# Patient Record
Sex: Male | Born: 1940
Health system: Southern US, Community
[De-identification: ages and names within clinical notes are randomized; demographics above are authoritative.]

## PROBLEM LIST (undated history)

## (undated) DIAGNOSIS — E785 Hyperlipidemia, unspecified: Secondary | ICD-10-CM

## (undated) DIAGNOSIS — R7303 Prediabetes: Secondary | ICD-10-CM

## (undated) DIAGNOSIS — C801 Malignant (primary) neoplasm, unspecified: Secondary | ICD-10-CM

## (undated) DIAGNOSIS — Z85038 Personal history of other malignant neoplasm of large intestine: Secondary | ICD-10-CM

## (undated) DIAGNOSIS — M1990 Unspecified osteoarthritis, unspecified site: Secondary | ICD-10-CM

## (undated) DIAGNOSIS — E119 Type 2 diabetes mellitus without complications: Secondary | ICD-10-CM

## (undated) DIAGNOSIS — G709 Myoneural disorder, unspecified: Secondary | ICD-10-CM

## (undated) DIAGNOSIS — I10 Essential (primary) hypertension: Secondary | ICD-10-CM

## (undated) DIAGNOSIS — Z8489 Family history of other specified conditions: Secondary | ICD-10-CM

## (undated) HISTORY — PX: JOINT REPLACEMENT: SHX530

## (undated) HISTORY — DX: Personal history of other malignant neoplasm of large intestine: Z85.038

## (undated) HISTORY — PX: COLON SURGERY: SHX602

## (undated) MED FILL — Dexamethasone Sodium Phosphate Inj 100 MG/10ML: INTRAMUSCULAR | Qty: 1 | Status: AC

---

## 2008-10-19 HISTORY — PX: JOINT REPLACEMENT: SHX530

## 2008-12-08 ENCOUNTER — Emergency Department: Payer: Self-pay | Admitting: Emergency Medicine

## 2008-12-08 IMAGING — CT CT STONE STUDY
1 of 2 series · 16 of 32 positions shown, 20 images · non-contrast
Comparison: none

REASON FOR EXAM: left abd pain
COMMENTS:

[Series 2: stone · axial · 0.89mm/px · z∈[-733,-277]mm · 16 of 166 slices shown, 20 images]
[im 7/166  soft-tissue]
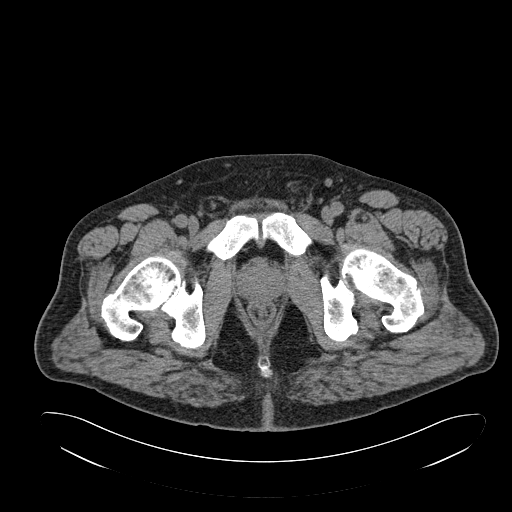
[im 7/166  bone]
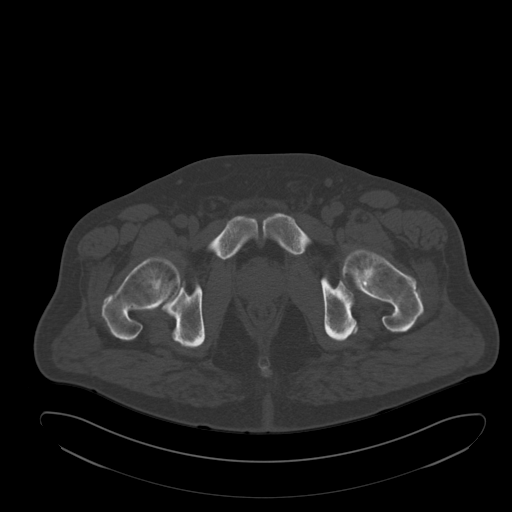
[im 19/166  soft-tissue]
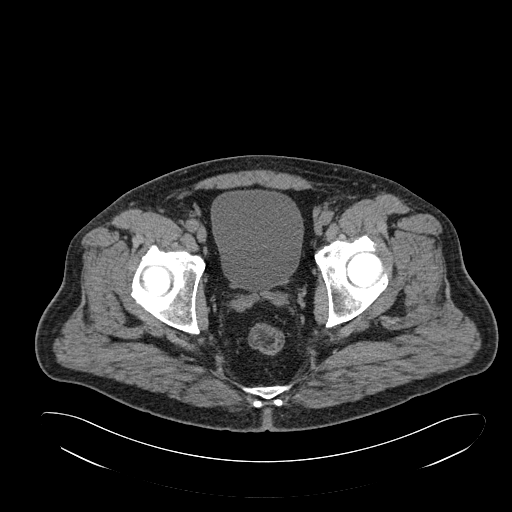
[im 31/166  soft-tissue]
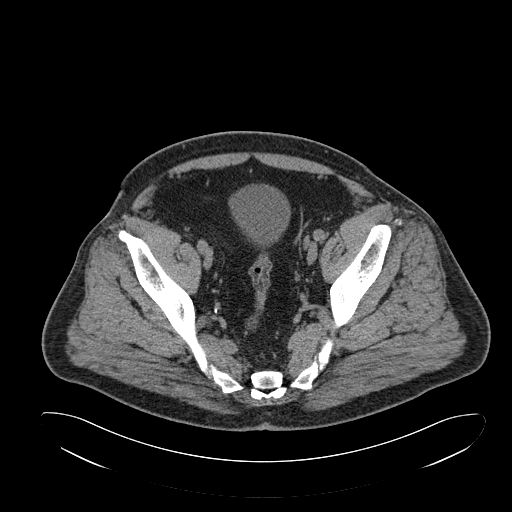
[im 43/166  soft-tissue]
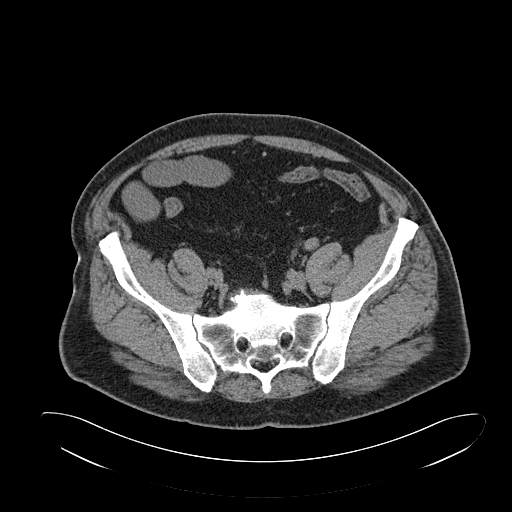
[im 56/166  soft-tissue]
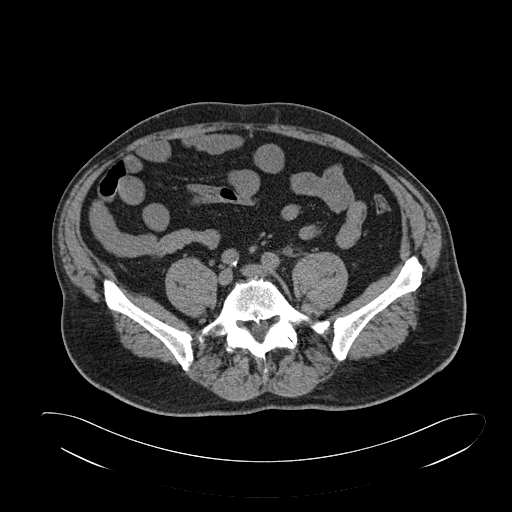
[im 68/166  soft-tissue]
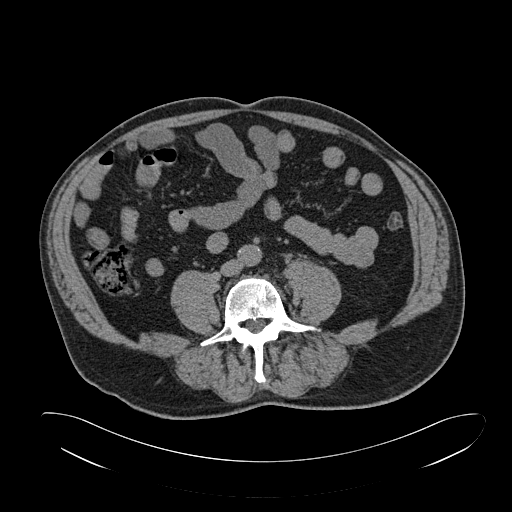
[im 80/166  soft-tissue]
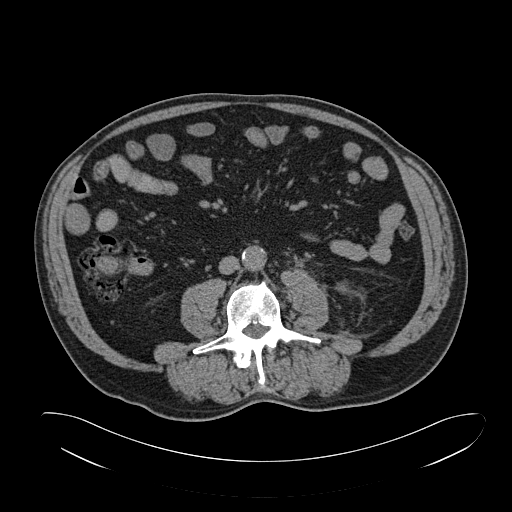
[im 86/166  soft-tissue]
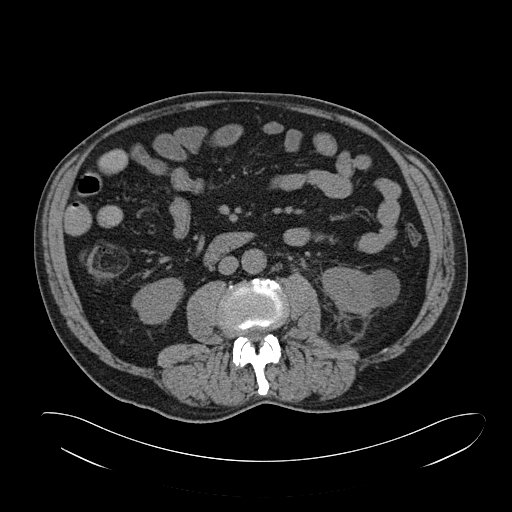
[im 98/166  soft-tissue]
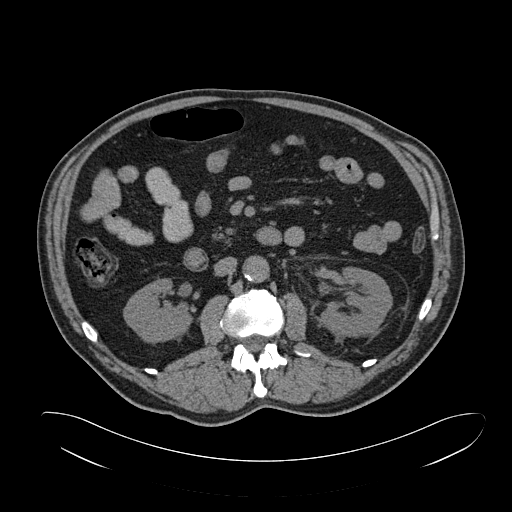
[im 98/166  bone]
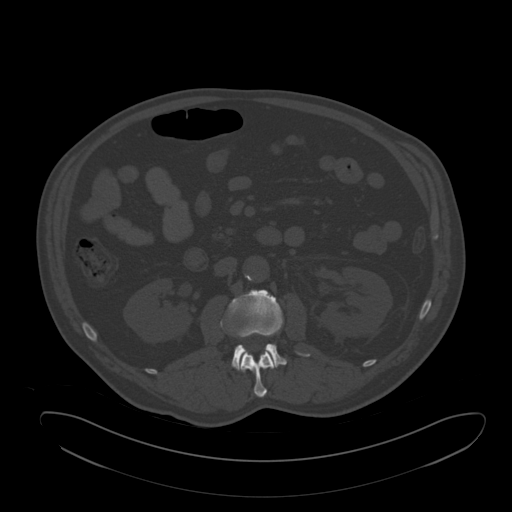
[im 111/166  soft-tissue]
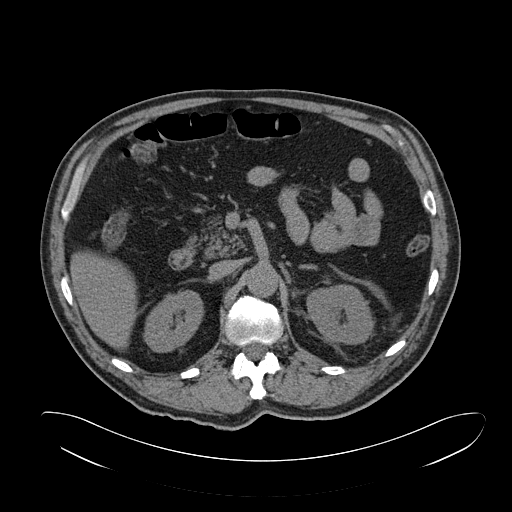
[im 123/166  soft-tissue]
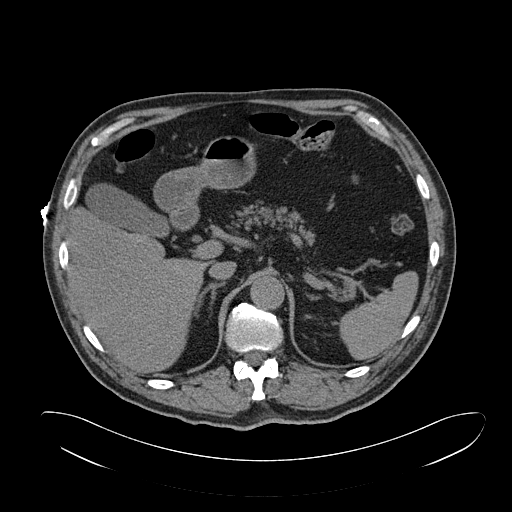
[im 135/166  soft-tissue]
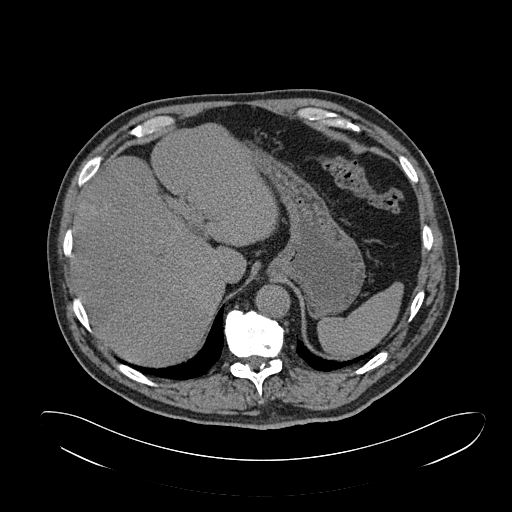
[im 141/166  lung]
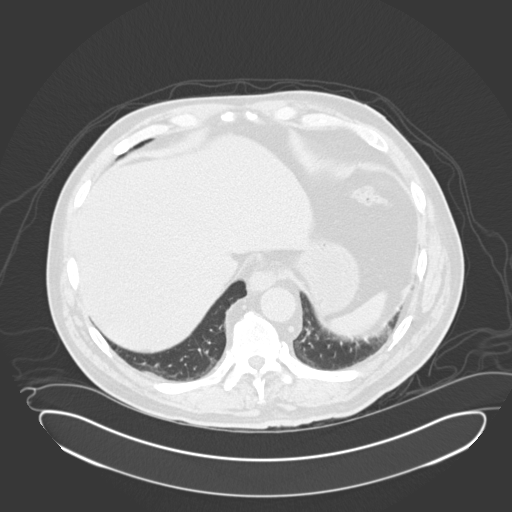
[im 147/166  soft-tissue]
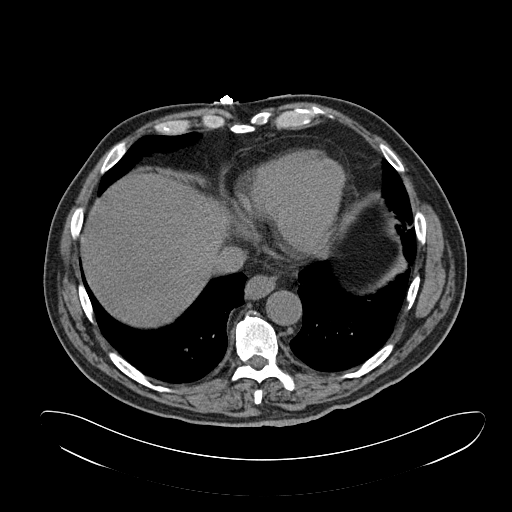
[im 147/166  lung]
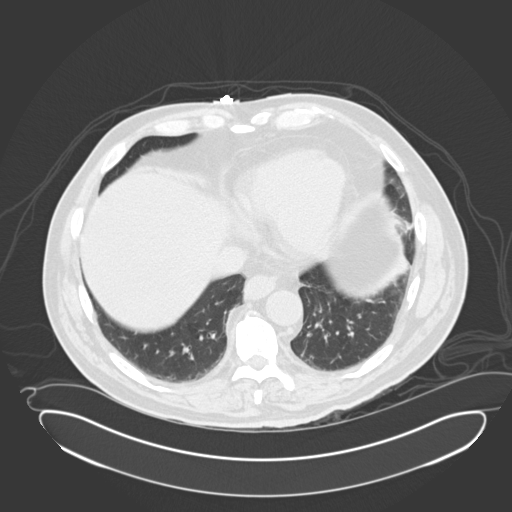
[im 153/166  lung]
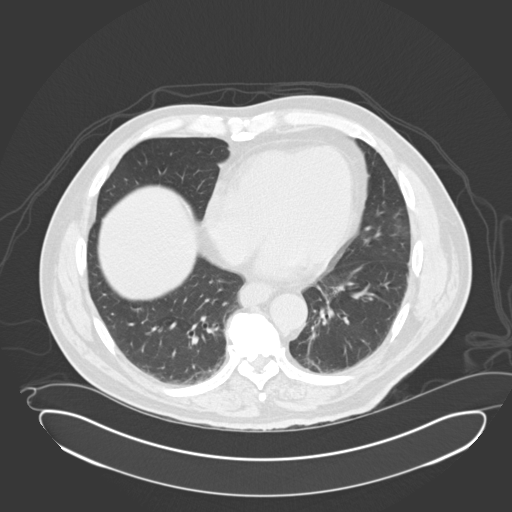
[im 159/166  soft-tissue]
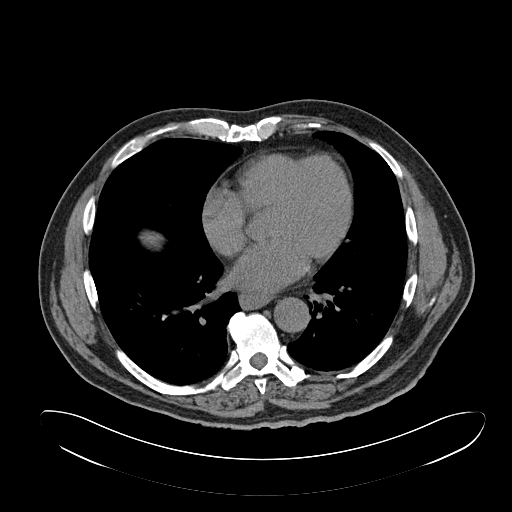
[im 159/166  lung]
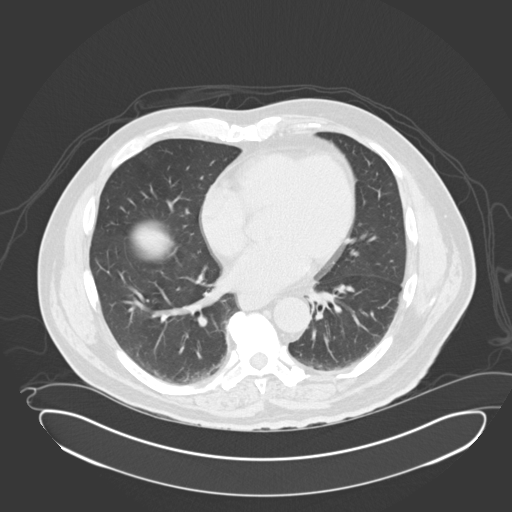

[16 of 32 positions shown; findings below may reference images not displayed]

PROCEDURE:     CT  - CT ABDOMEN /PELVIS WO (STONE)  - [DATE]  [DATE]

RESULT:     A renal stone protocol emergent CT of the abdomen and pelvis
demonstrates mild left hydroureter and hydronephrosis with perinephric and
periureteric stranding secondary to a distal left ureter ureteral stone of 2
mm on image #143. The urinary bladder is unremarkable. There is no ascites.
No additional stones are evident in either kidney. No radiopaque gallstones
are evident. The abdominal viscera otherwise appear to be grossly normal.
The aorta is normal in caliber but does contain some atherosclerotic
calcification. There is atelectasis in the dependent portion of the lung
bases. There is no evidence of diverticulitis or appendicitis.
IMPRESSION: 2 mm distal left ureteral stone with mild hydroureter and hydronephrosis.

## 2008-12-08 IMAGING — CR DG ABDOMEN 3V
1 series · 6 of 6 positions shown · non-contrast
Comparison: none

REASON FOR EXAM: abd  pain
COMMENTS:

[Series 1: view not recorded · 0.17mm/px · 6 of 6 slices shown]
[im 1/6]
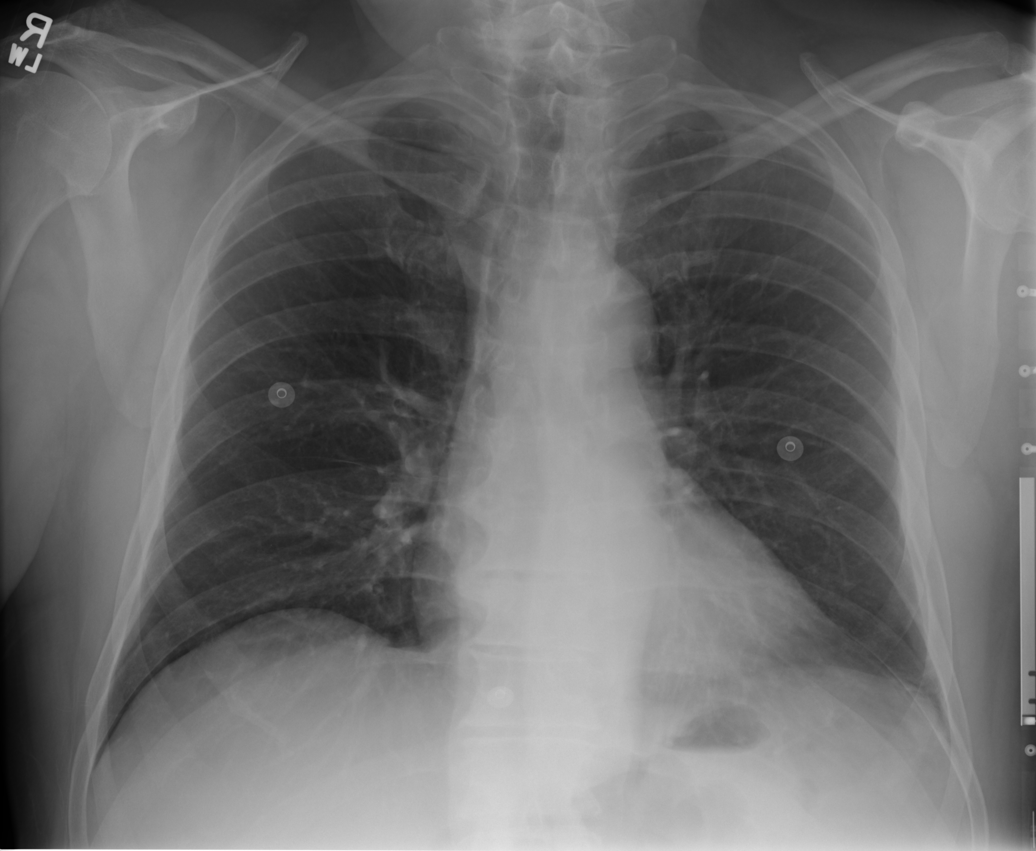
[im 2/6]
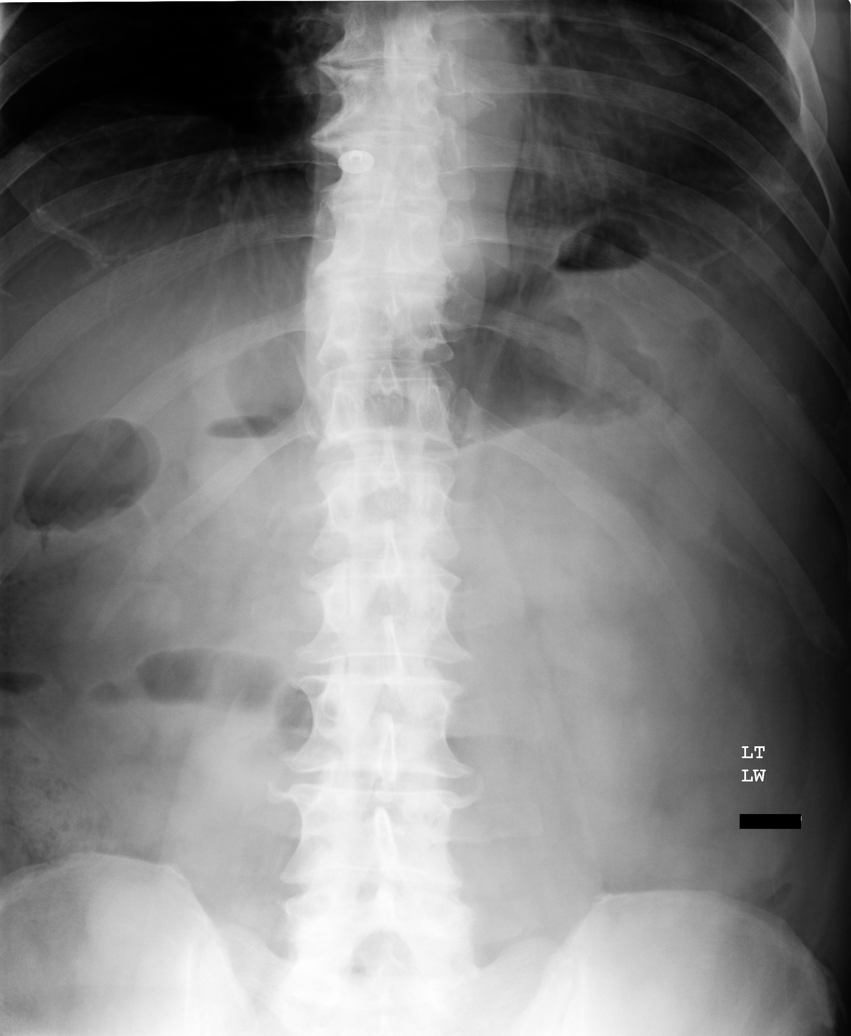
[im 3/6]
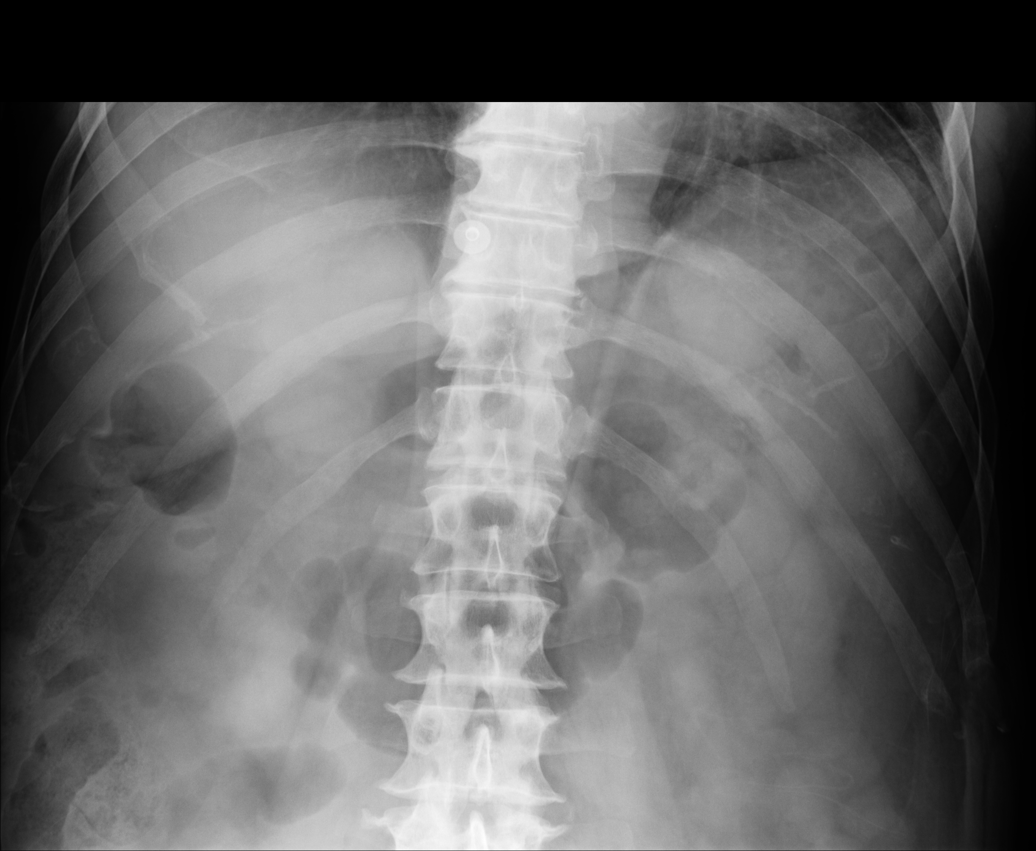
[im 4/6]
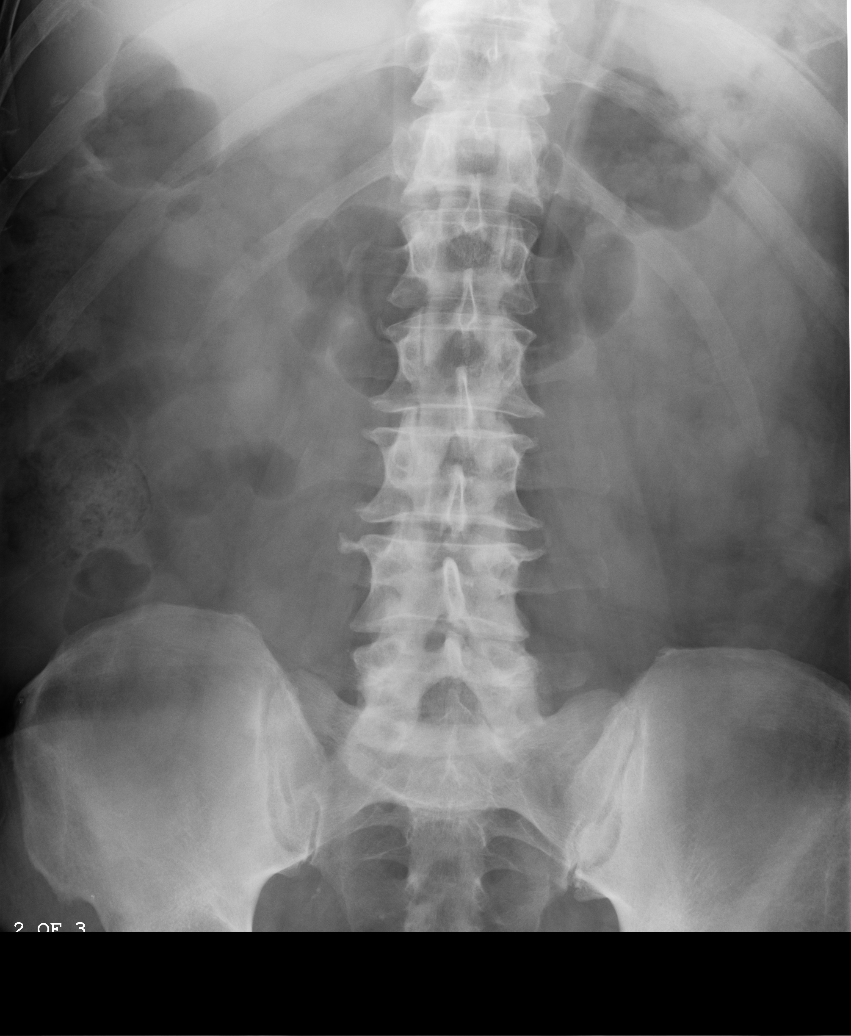
[im 5/6]
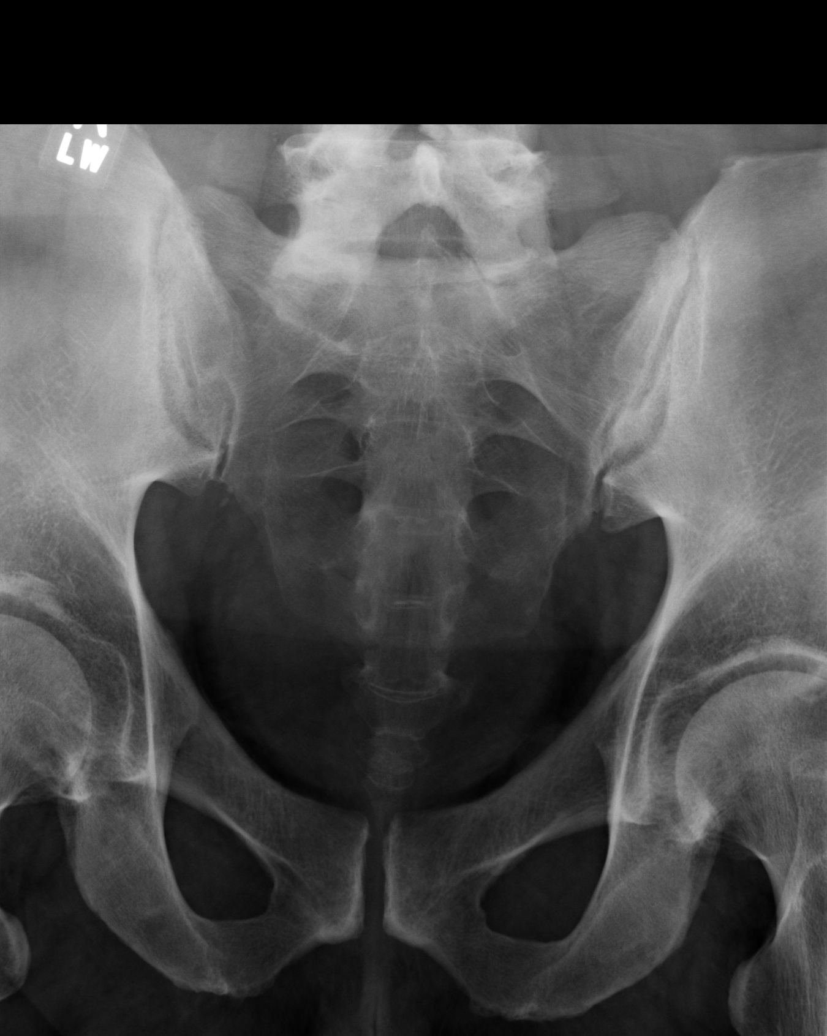
[im 6/6]
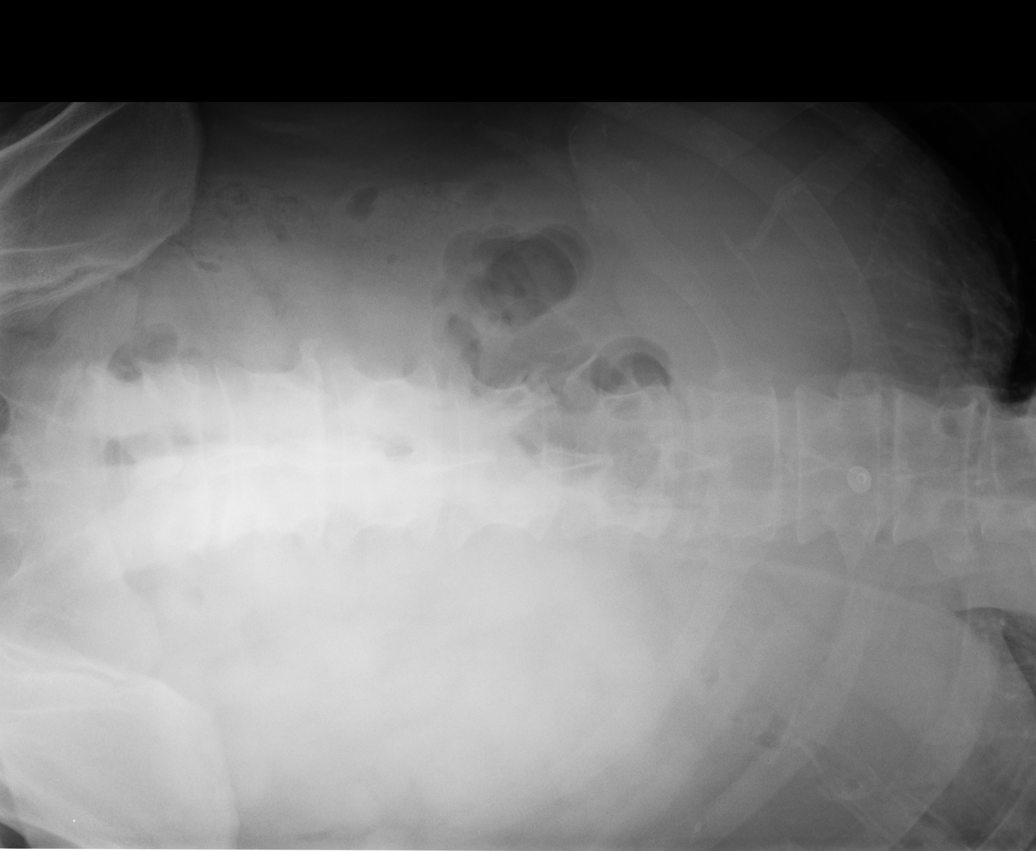

[6 of 6 positions shown; findings below may reference images not displayed]

PROCEDURE:     DXR - DXR ABDOMEN 3-WAY (INCL PA CXR)  - [DATE]  [DATE]

RESULT:      A single image of the chest demonstrates no infiltrate, mass,
edema or effusion. The heart is not enlarged. Degenerative changes are noted
in the thoracic and lumbar spines. Air and stool is scattered through the
colon. There is no abnormal bowel distention. No free air is evident. No
significant air-fluid levels are seen. There is some air within loops of
small bowel.
IMPRESSION: 1. No acute cardiopulmonary disease.
2. No evidence of bowel obstruction or perforation. Continued clinical
followup is recommended.

## 2008-12-09 ENCOUNTER — Emergency Department: Payer: Self-pay | Admitting: Emergency Medicine

## 2008-12-09 IMAGING — CR DG ABDOMEN 3V
1 series · 4 of 4 positions shown · non-contrast
Comparison: none

REASON FOR EXAM: abd pain
COMMENTS:

[Series 1: view not recorded · 0.17mm/px · 4 of 4 slices shown]
[im 1/4]
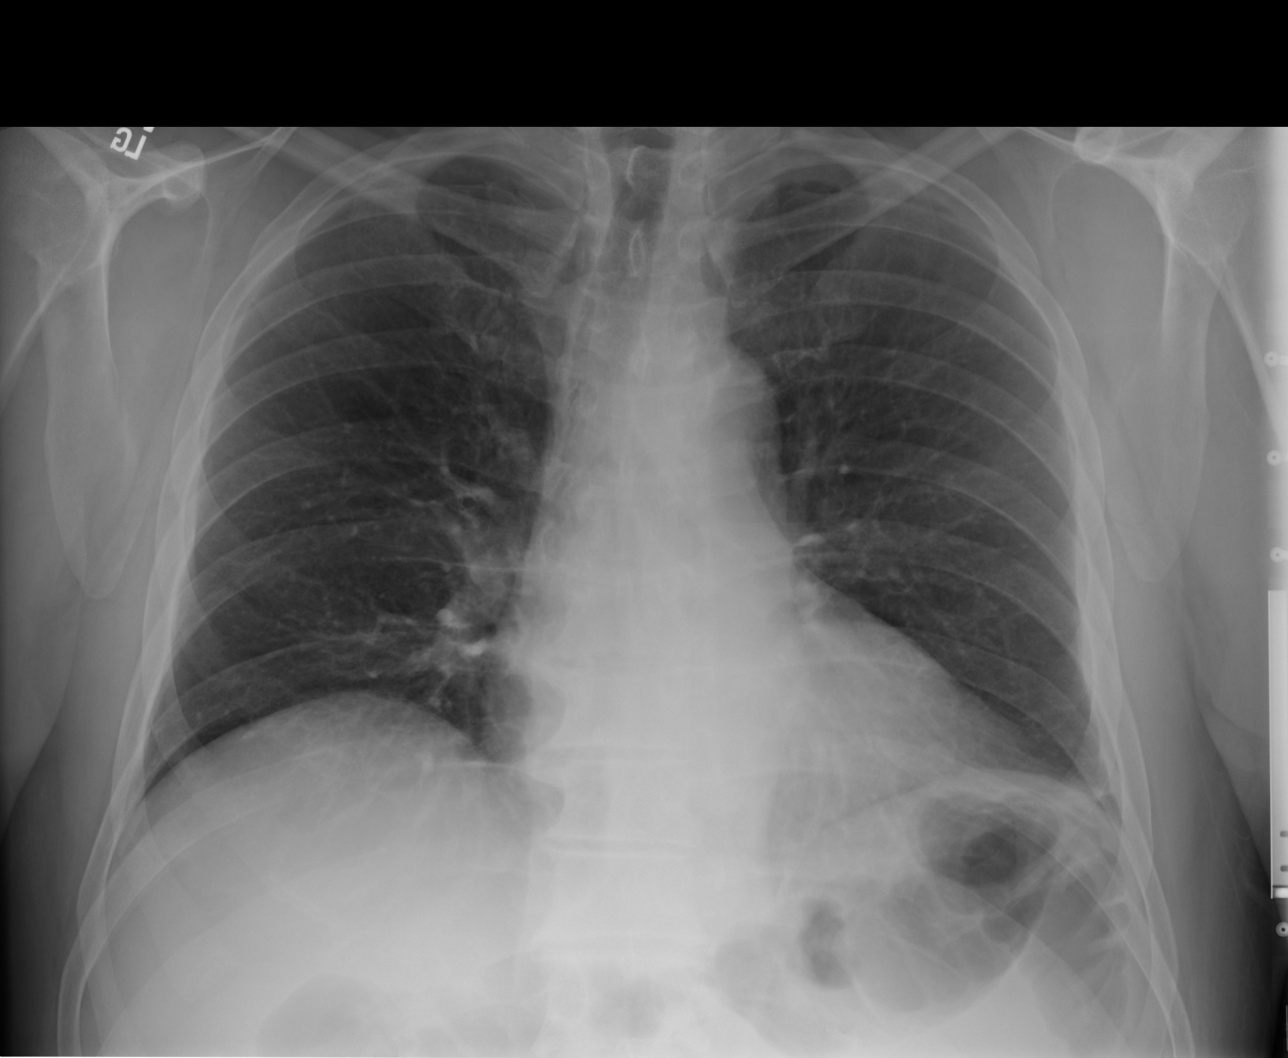
[im 2/4]
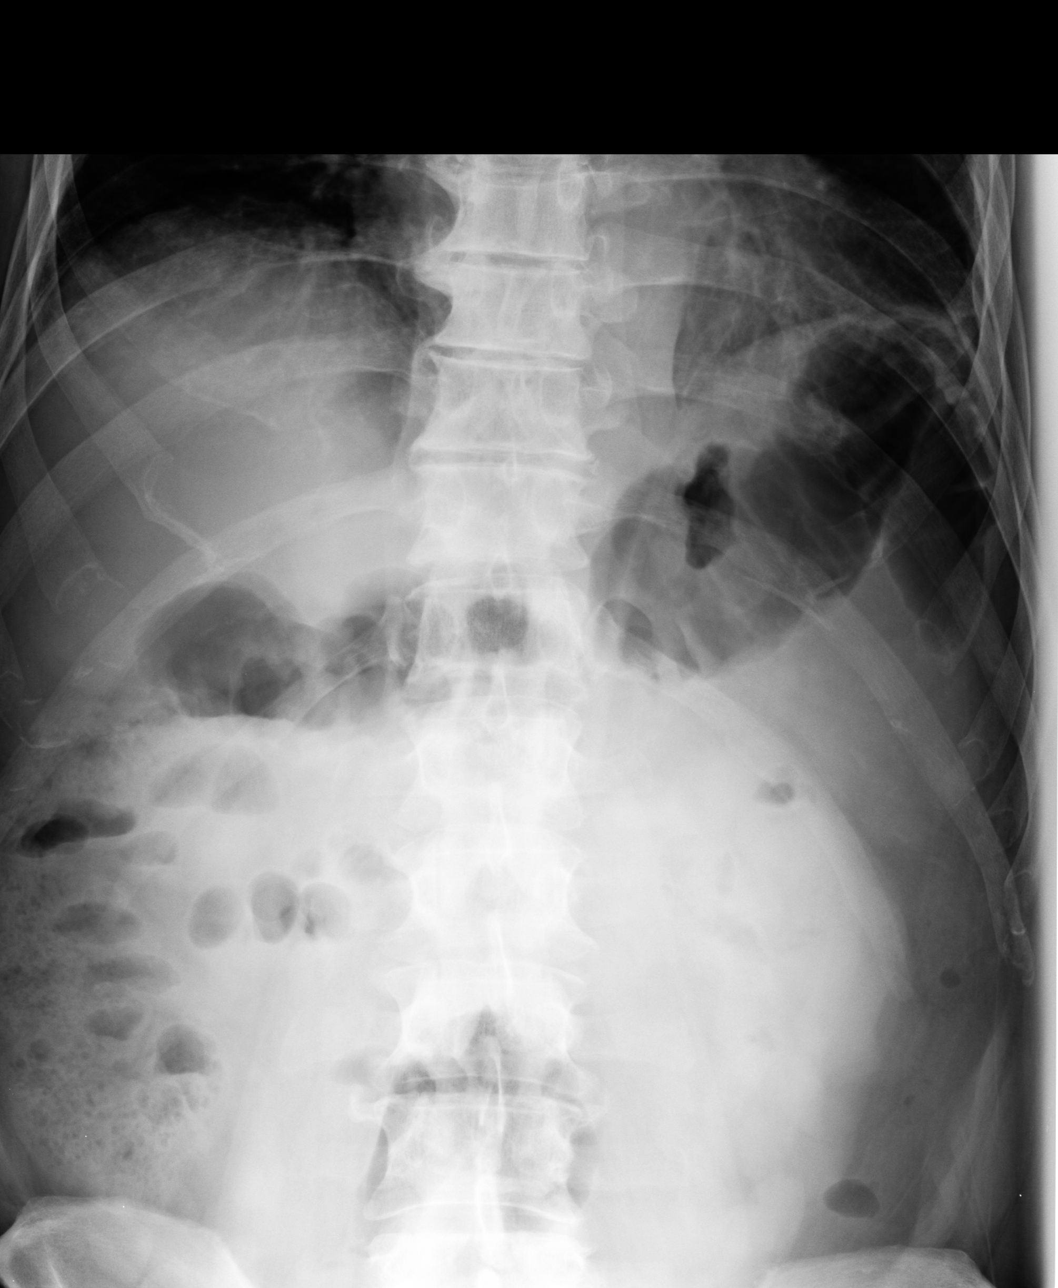
[im 3/4]
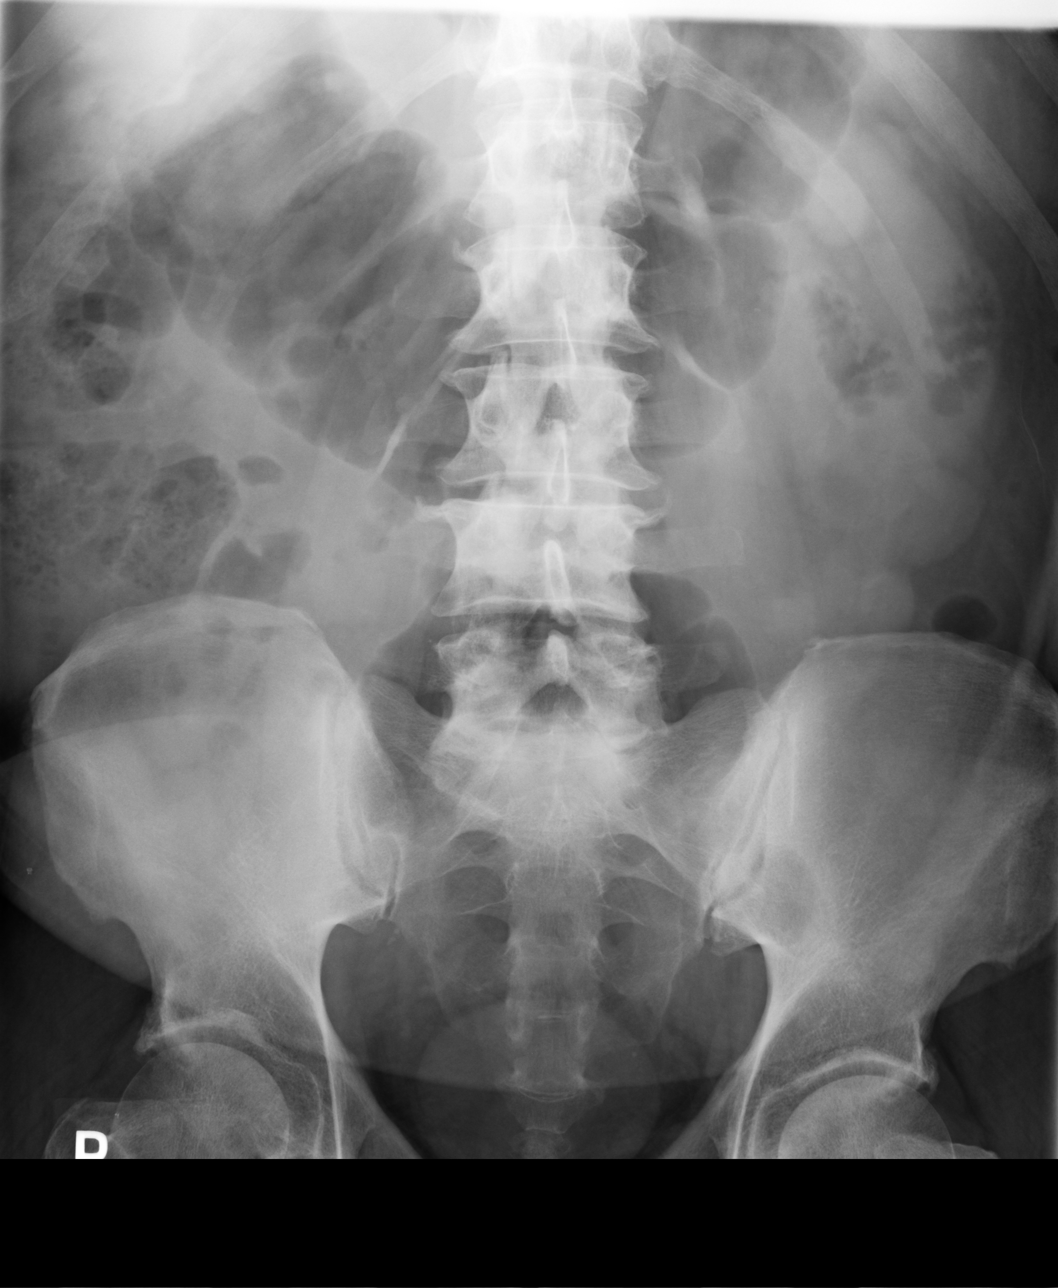
[im 4/4]
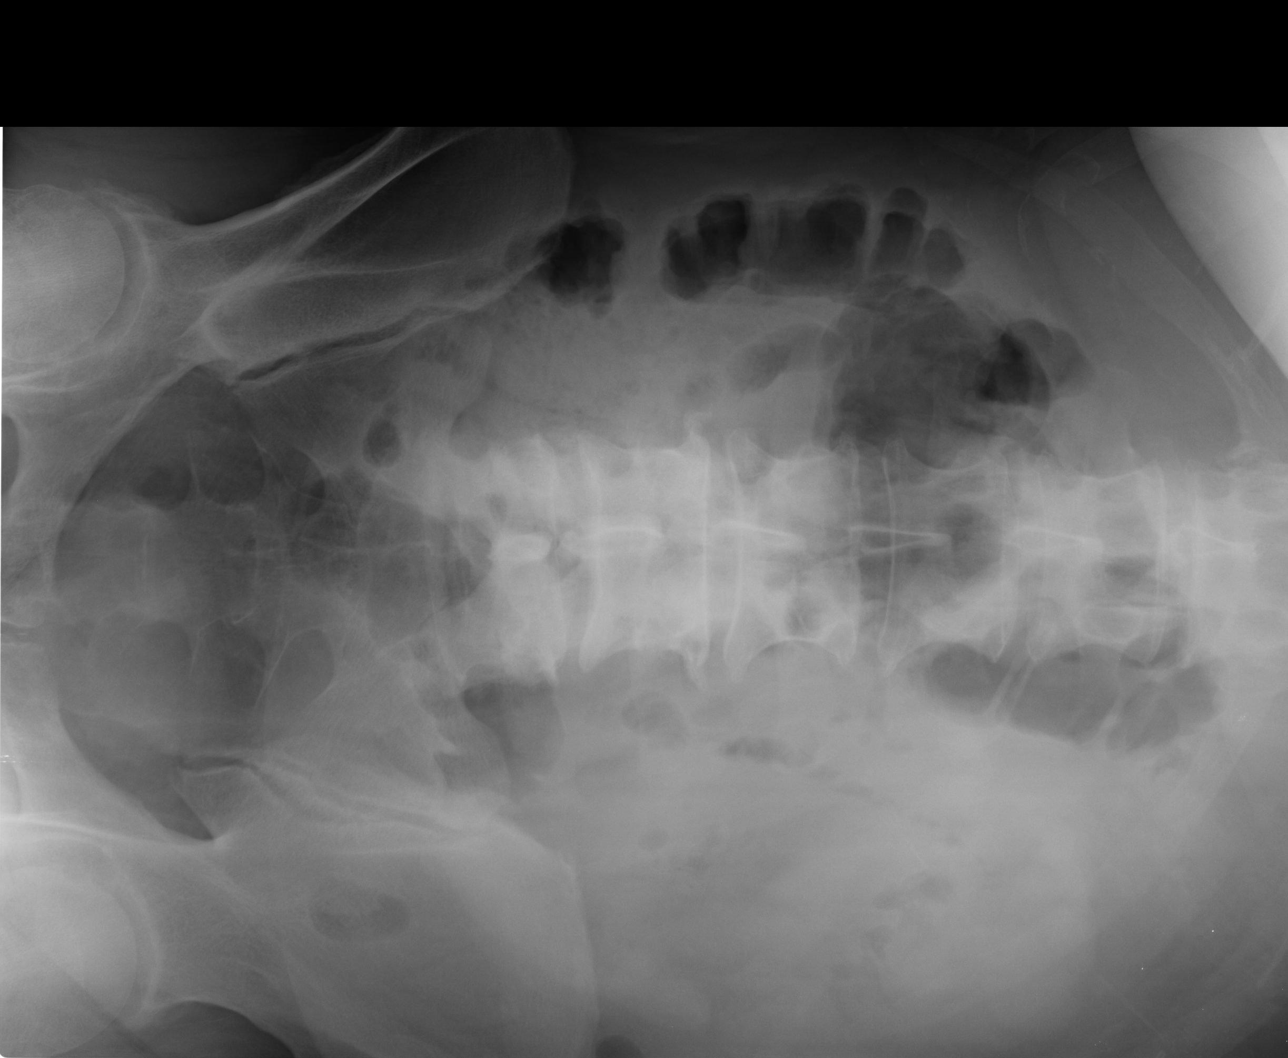

[4 of 4 positions shown; findings below may reference images not displayed]

PROCEDURE:     DXR - DXR ABDOMEN 3-WAY (INCL PA CXR)  - [DATE]  [DATE]

RESULT:     A view of the chest shows shallow inspiration with no acute
cardiopulmonary disease. There is a moderate amount of air and stool
especially in the right and transverse portions of the colon. Some air is
seen within loops of small bowel without severe distention. No free air is
evident.
IMPRESSION: No definite obstruction or perforation. No acute
cardiopulmonary disease. Degenerative changes are noted incidentally in the
spine.

## 2008-12-09 IMAGING — CT CT ABD-PELV W/ CM
1 of 3 series · 12 of 32 positions shown, 18 images · non-contrast
Comparison: none

REASON FOR EXAM: (1) abd pain; (2) abd pain LLQ
COMMENTS:

[Series 2: soft tissue · axial · 0.84mm/px · z∈[-477,-24]mm · 12 of 179 slices shown, 18 images]
[im 14/179  soft-tissue]
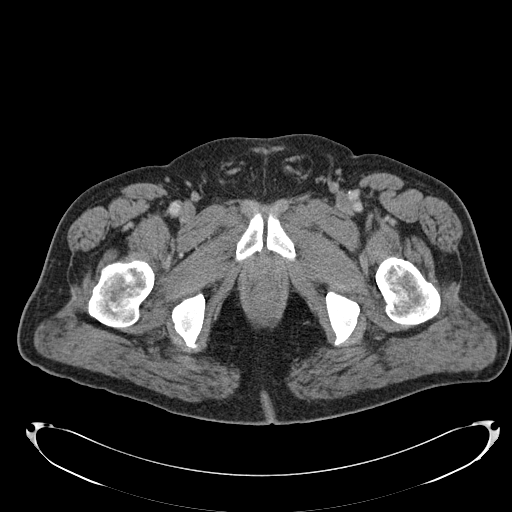
[im 14/179  bone]
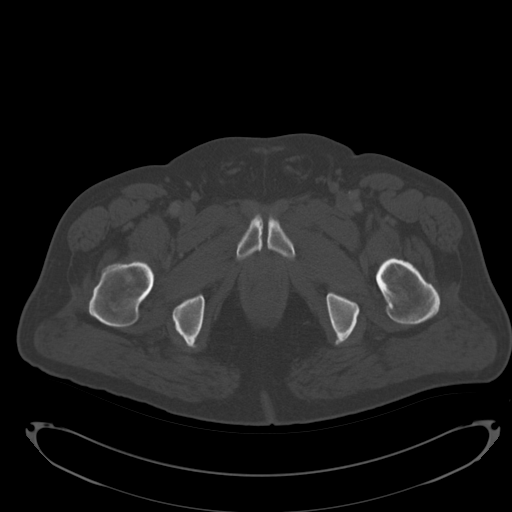
[im 28/179  soft-tissue]
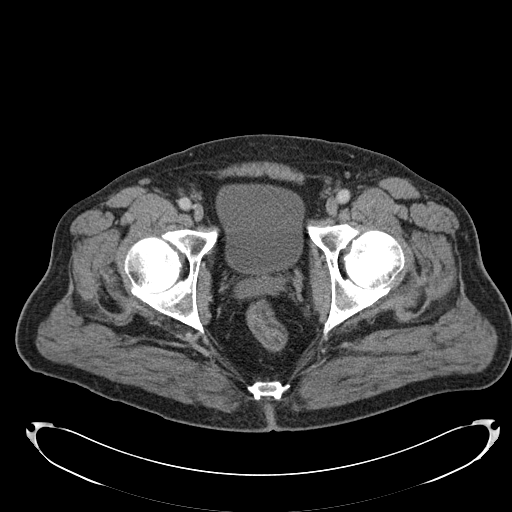
[im 42/179  soft-tissue]
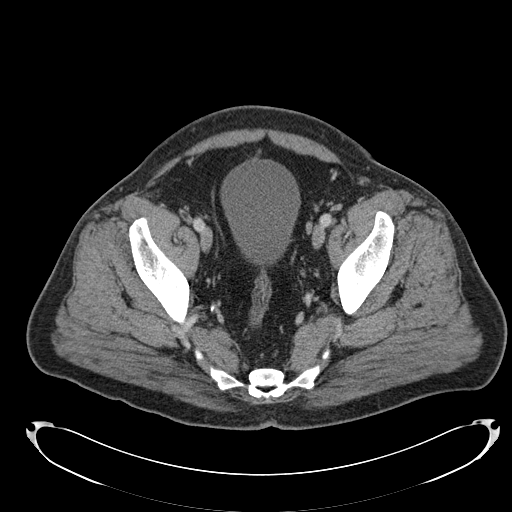
[im 55/179  soft-tissue]
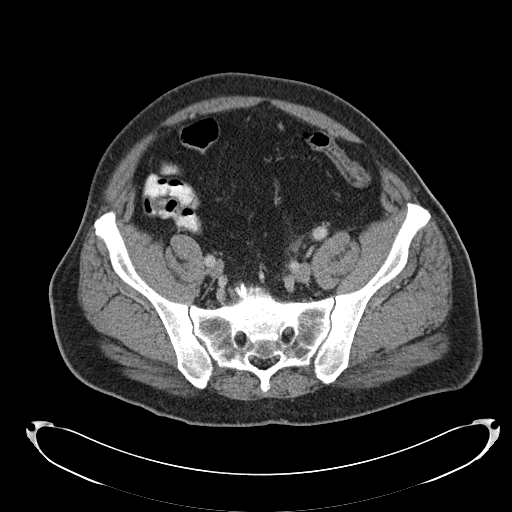
[im 69/179  soft-tissue]
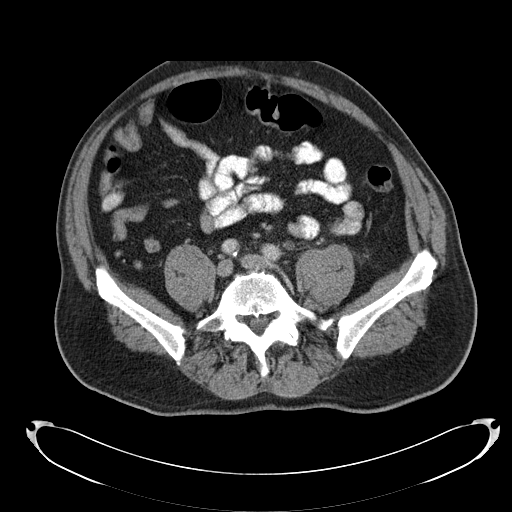
[im 83/179  soft-tissue]
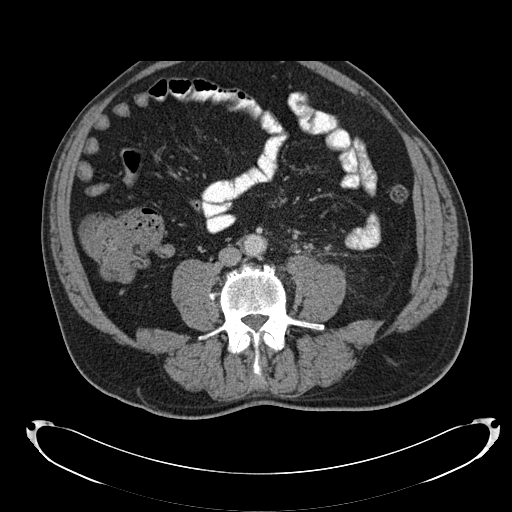
[im 96/179  soft-tissue]
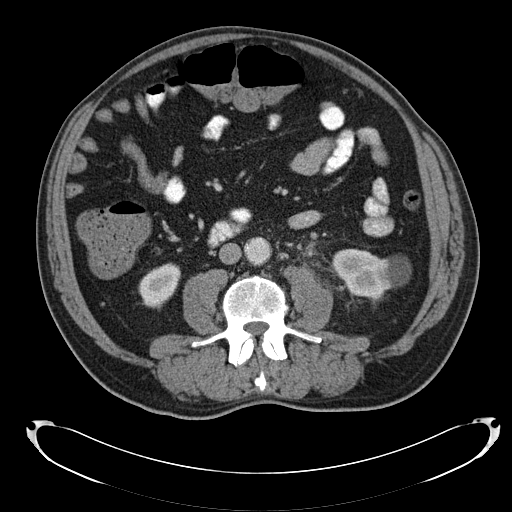
[im 110/179  soft-tissue]
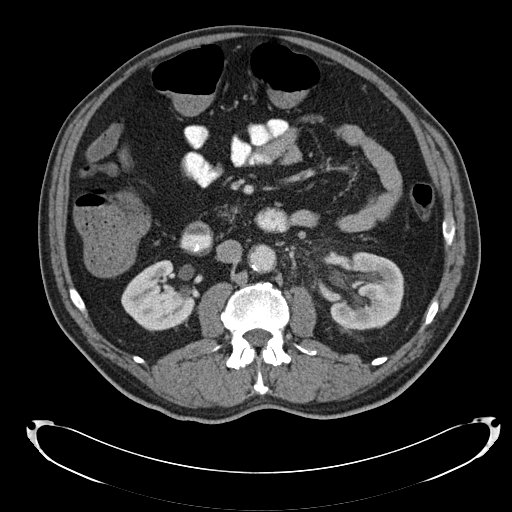
[im 124/179  soft-tissue]
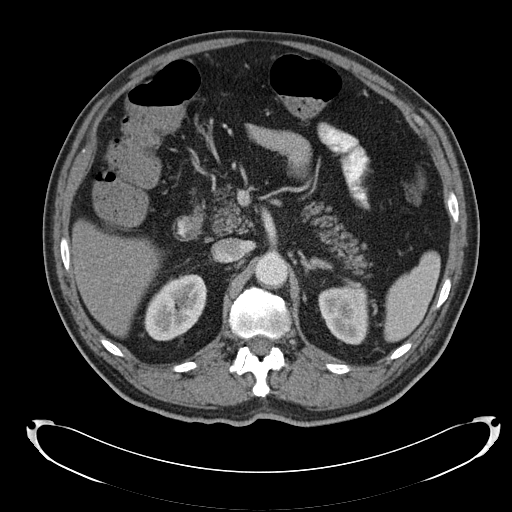
[im 124/179  lung]
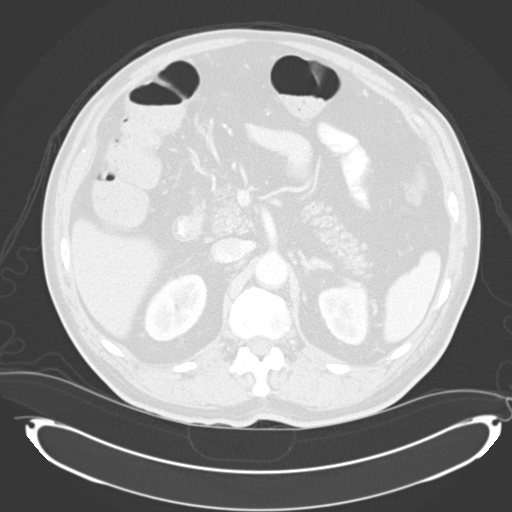
[im 124/179  bone]
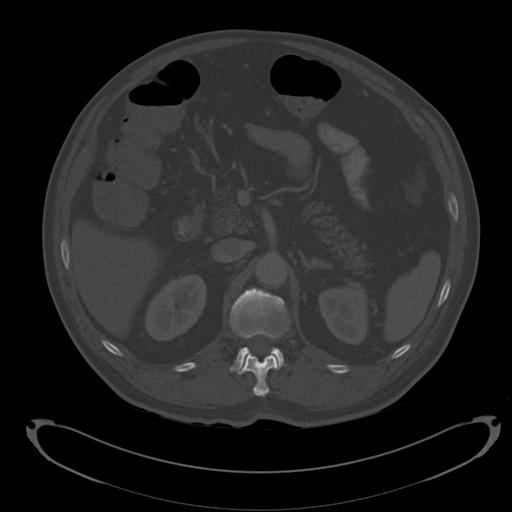
[im 137/179  soft-tissue]
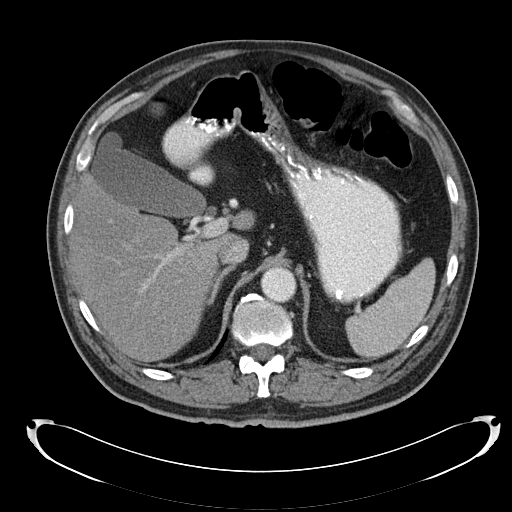
[im 137/179  lung]
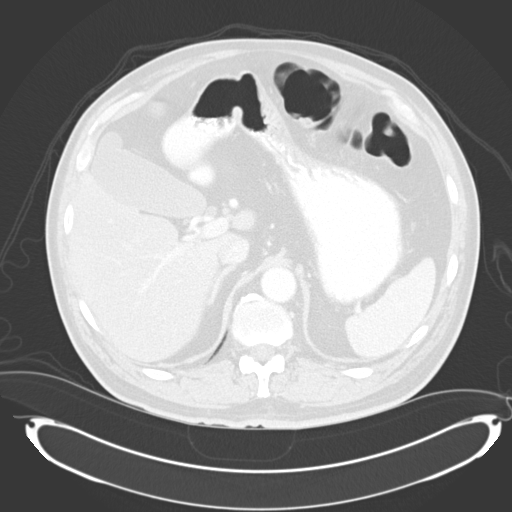
[im 151/179  soft-tissue]
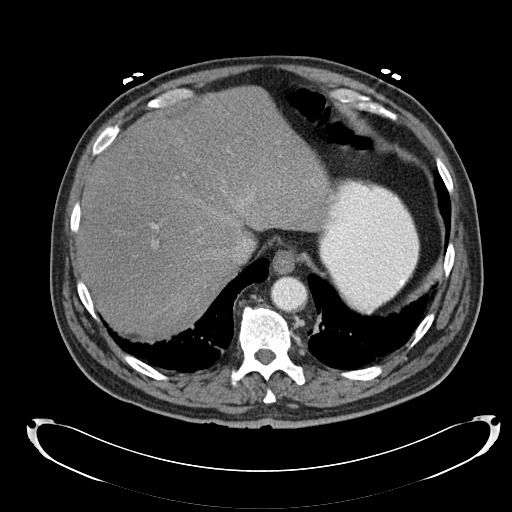
[im 151/179  lung]
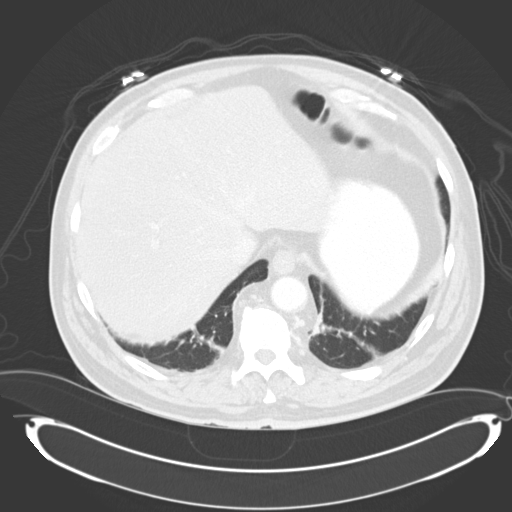
[im 165/179  soft-tissue]
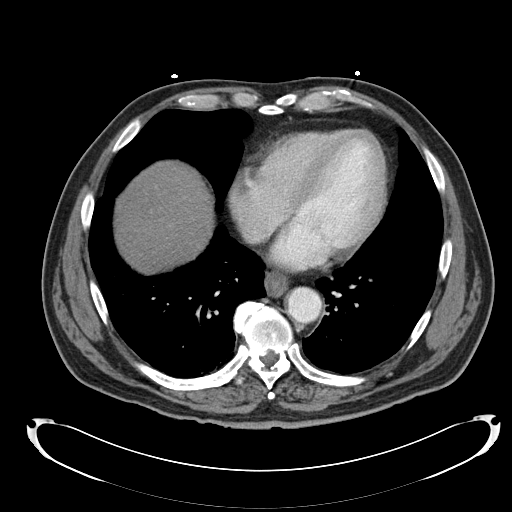
[im 165/179  lung]
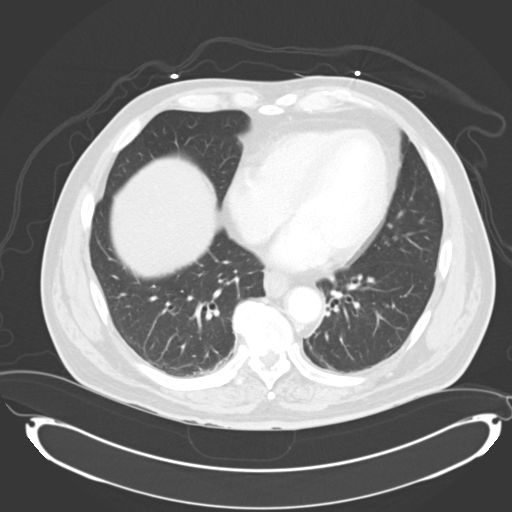

[12 of 32 positions shown; findings below may reference images not displayed]

PROCEDURE:     CT  - CT ABDOMEN / PELVIS  W  - [DATE] [DATE]

RESULT:       Helical 3-mm sections were obtained from the lung bases
through the pubic symphysis status post intravenous administration of 100 ml
[BN] and oral contrast. Delayed imaging was also obtained.

This study was compared to a noncontrasted CT from [DATE].

Evaluation of the lung bases demonstrate linear areas of scarring versus
atelectasis. The liver demonstrates a diffuse low attenuating architecture.
The spleen, adrenals and pancreas are unremarkable. Evaluation of the LEFT
kidney demonstrates an exophytic cyst involving the LEFT kidney measuring
approximately 3.5 cm in diameter.

There is a low attenuating focus projecting within the LEFT renal pelvis.
This may represent a parapelvic cyst. Old hemorrhage within the collecting
system is also a diagnostic consideration.  There is inflammatory change
within the perinephric fat as well as along the ureter. Mild hydronephrosis
is appreciated involving the LEFT kidney as well as mild hydroureter. Within
the distal LEFT ureter, a 2 x 3 mm calculus is appreciated described on a
previous study. There is no evidence of drainable loculated fluid
collections, masses or adenopathy. No CT evidence of an abdominal aortic
aneurysm.  The RIGHT kidney is unremarkable.
IMPRESSION: 1.     Perinephric stranding and periureteral stranding raising the
suspicion of inflammation versus infection. There is mild hydronephrosis.
There are also findings which may represent a parapelvic cyst or possibly
the sequela of chronic hematoma within the renal pelvis on the LEFT.
Surveillance evaluation recommended.
2.     LEFT renal cyst.
3.     These findings may be secondary to pyelonephritis and/or urethritis.
Surveillance evaluation recommended.
4.     Dr. DLEKE of the Emergency Department was informed of these
findings via preliminary fax report on [DATE] at 11 p.m. CST.

## 2008-12-19 ENCOUNTER — Ambulatory Visit: Payer: Self-pay | Admitting: Urology

## 2008-12-19 IMAGING — CR DG ABDOMEN 1V
1 series · 1 of 1 positions shown · non-contrast
Comparison: none

REASON FOR EXAM: NEPHROLITHIASIS PT NEED FILMS
COMMENTS:

[view not recorded]
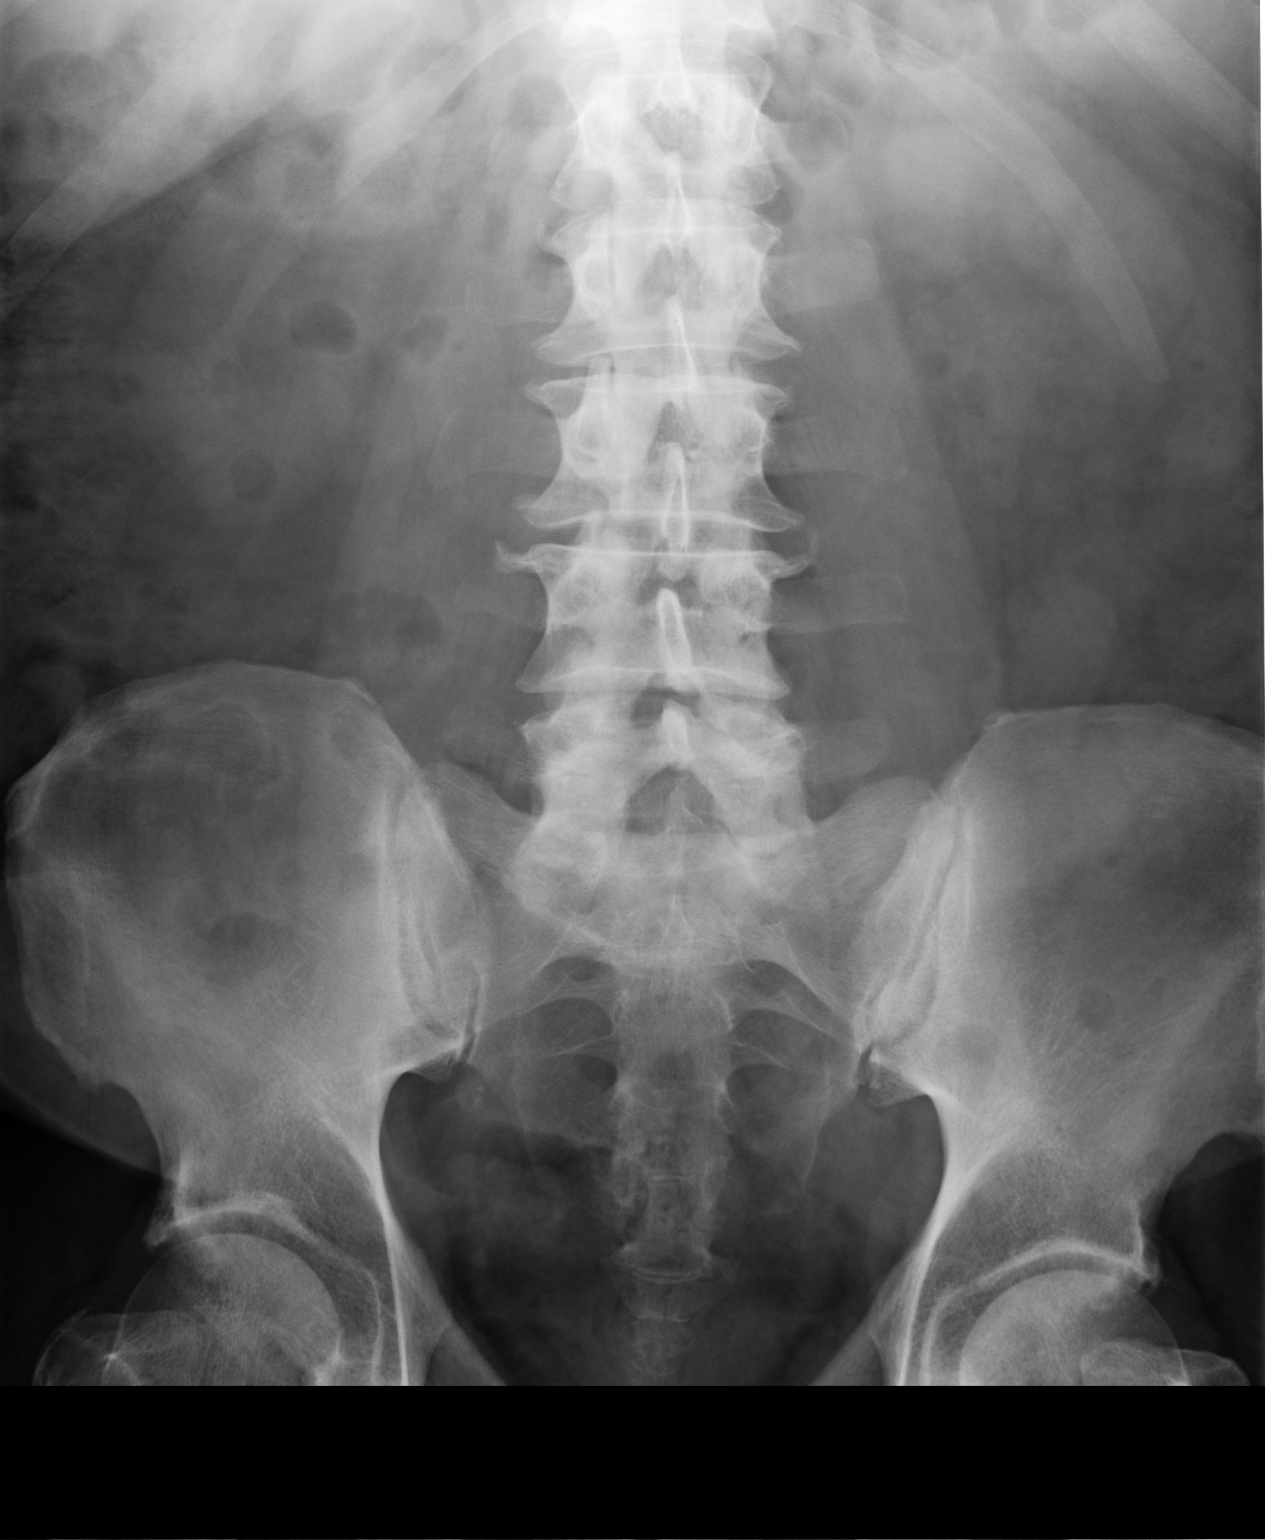

[1 of 1 positions shown; findings below may reference images not displayed]

PROCEDURE:     DXR - DXR KIDNEY URETER BLADDER  - [DATE]  [DATE]

RESULT:     No definite renal or ureteral calcifications are identified. The
patient has a 2 mm distal left ureteral stone that has been demonstrated on
two previous CT's. The finding is not definitely identified on the current
routine radiograph.
IMPRESSION: 1.  No definite renal or ureteral calcifications are identified.
2.  The distal left ureteral stone previously demonstrated at CT is not
definitely identified by routine radiography.

## 2009-01-14 ENCOUNTER — Ambulatory Visit: Payer: Self-pay | Admitting: Family Medicine

## 2009-03-28 ENCOUNTER — Ambulatory Visit: Payer: Self-pay | Admitting: Family Medicine

## 2013-09-17 ENCOUNTER — Observation Stay: Payer: Self-pay | Admitting: Internal Medicine

## 2013-09-17 LAB — BASIC METABOLIC PANEL
Calcium, Total: 8.6 mg/dL (ref 8.5–10.1)
Co2: 26 mmol/L (ref 21–32)
Creatinine: 1.04 mg/dL (ref 0.60–1.30)
EGFR (African American): >60
EGFR (Non-African Amer.): >60
Osmolality: 279 (ref 275–301)
Potassium: 4 mmol/L (ref 3.5–5.1)
Sodium: 138 mmol/L (ref 136–145)

## 2013-09-17 LAB — CBC
HCT: 41.8 % (ref 40.0–52.0)
HGB: 14.1 g/dL (ref 13.0–18.0)
RBC: 4.38 10*6/uL — ABNORMAL LOW (ref 4.40–5.90)
RDW: 12.8 % (ref 11.5–14.5)

## 2013-09-17 IMAGING — CT CT NECK WITH CONTRAST
4 of 5 series · 13 of 33 positions shown, 15 images · IV contrast (agent unspecified)
Comparison: None.

CLINICAL DATA: Cough for 2 days. Throat swelling with difficulty
swallowing and thick speech. Swelling of the floor of the mouth
concern for Ludwig's angina.

EXAM:
CT NECK WITH CONTRAST
TECHNIQUE: Multidetector CT imaging of the neck was performed using the
standard protocol following the bolus administration of intravenous
contrast.
CONTRAST:  75 mm [AL]

[Series 2: axial neck · axial · 0.51mm/px · z∈[-298,-128]mm · 4 of 143 slices shown, 5 images]
[im 29/143  soft-tissue]
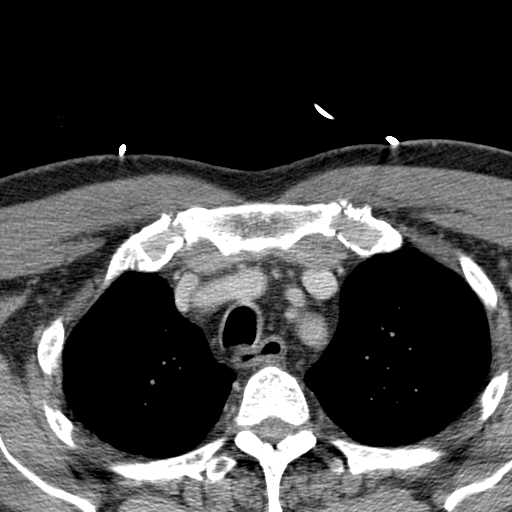
[im 29/143  bone]
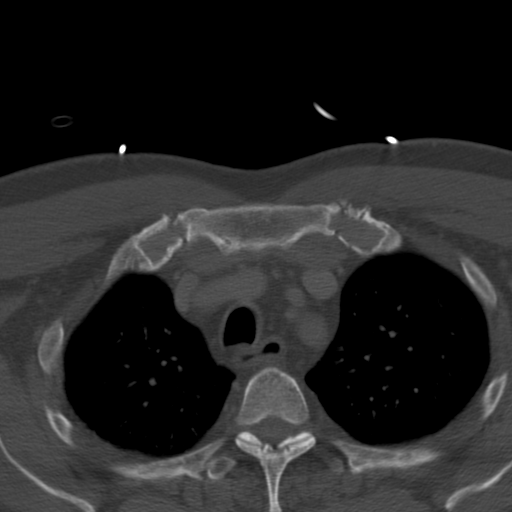
[im 57/143  bone]
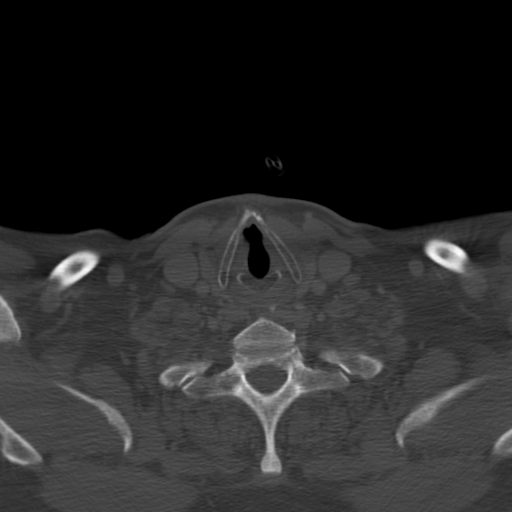
[im 86/143  bone]
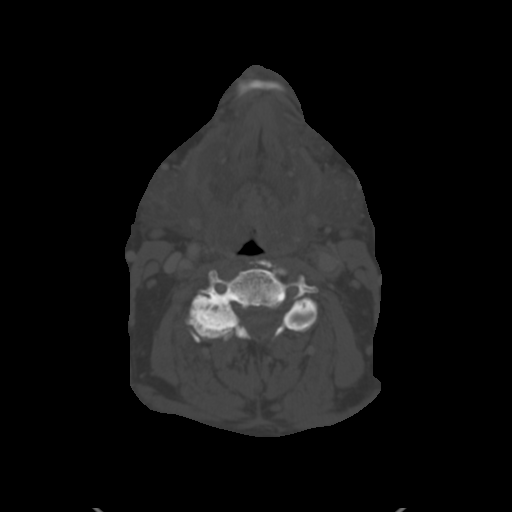
[im 114/143  bone]
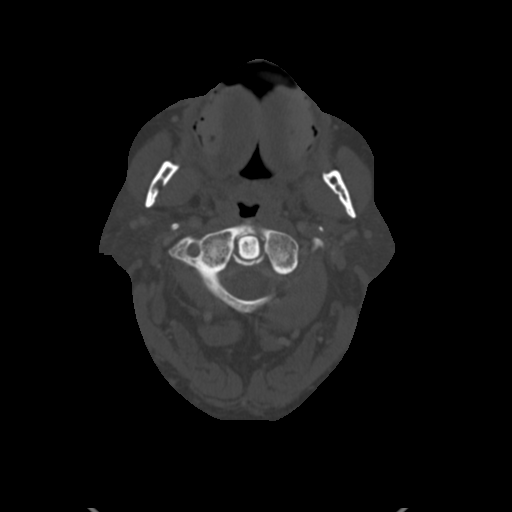

[Series 4: sag neck · sagittal · 0.54mm/px · 5 of 93 slices shown, 6 images]
[im 31/93  bone]
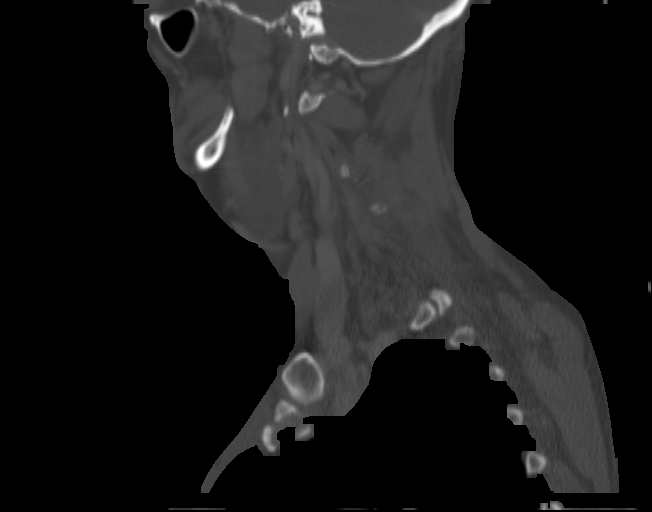
[im 39/93  bone]
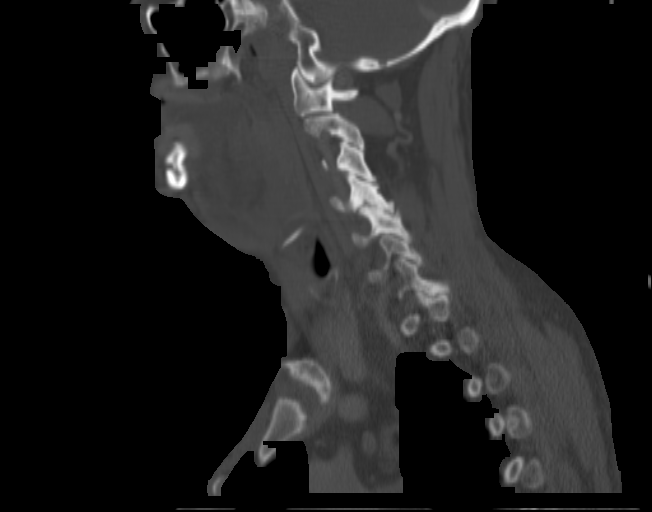
[im 47/93  soft-tissue]
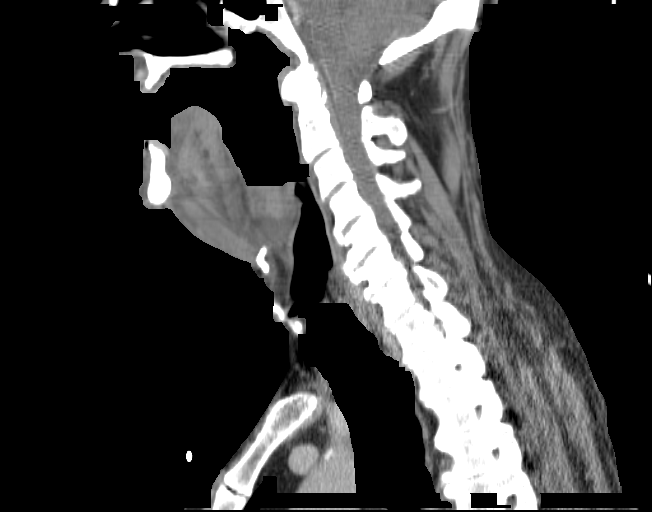
[im 47/93  bone]
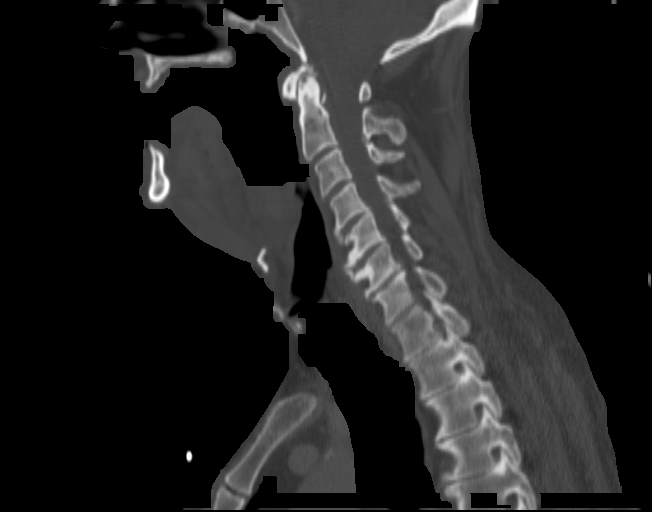
[im 54/93  bone]
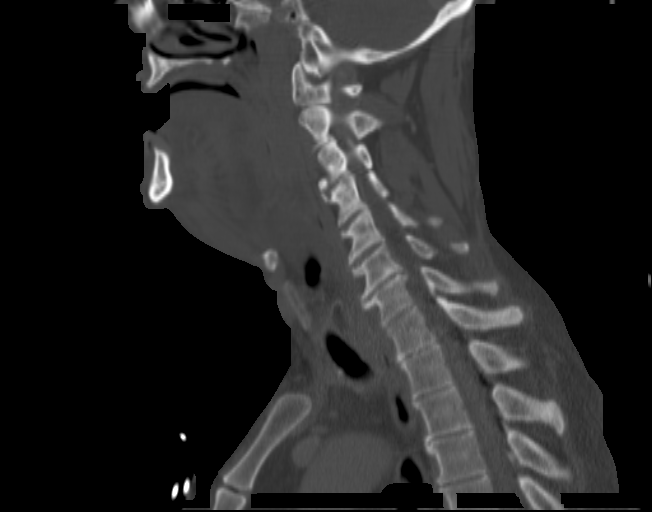
[im 62/93  bone]
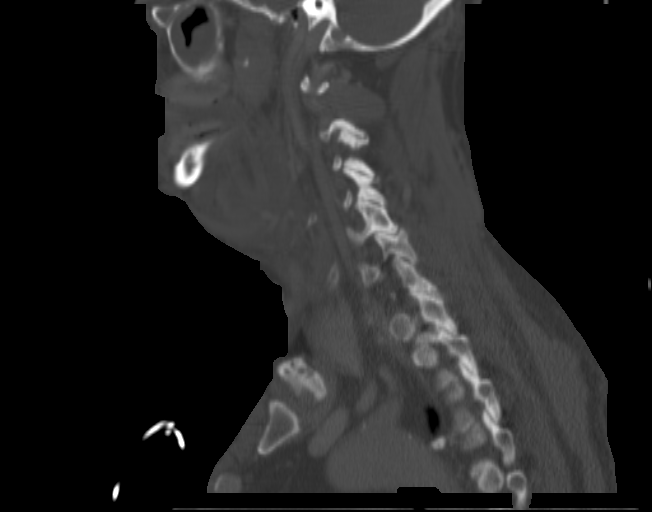

[Series 5: cor neck · coronal · 0.40mm/px · 3 of 96 slices shown]
[im 20/96  bone]
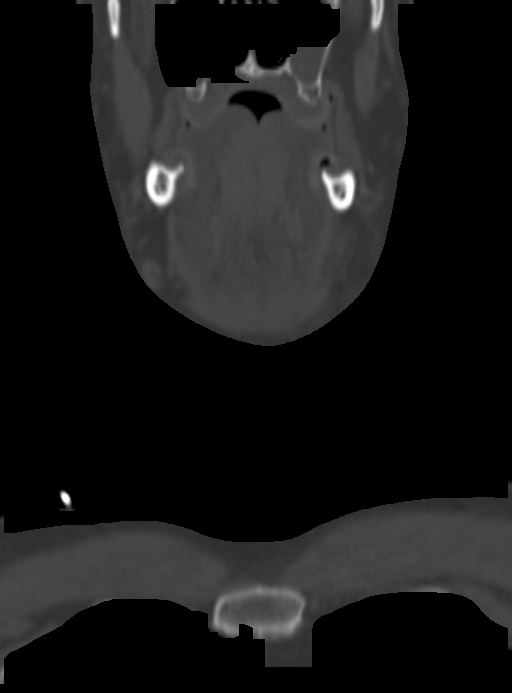
[im 39/96  bone]
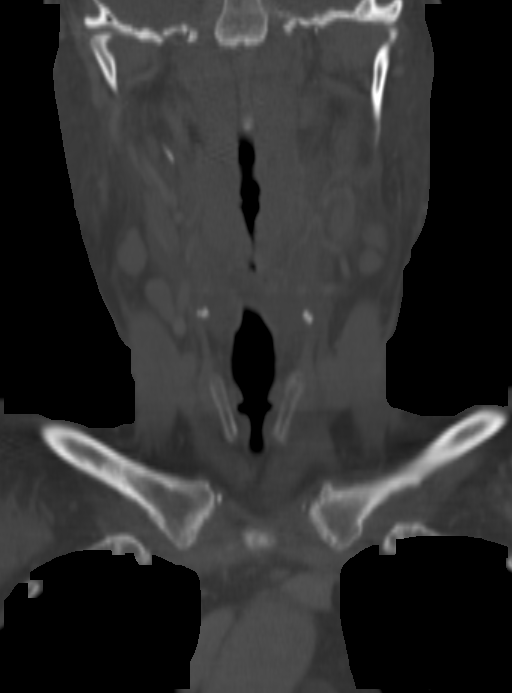
[im 58/96  bone]
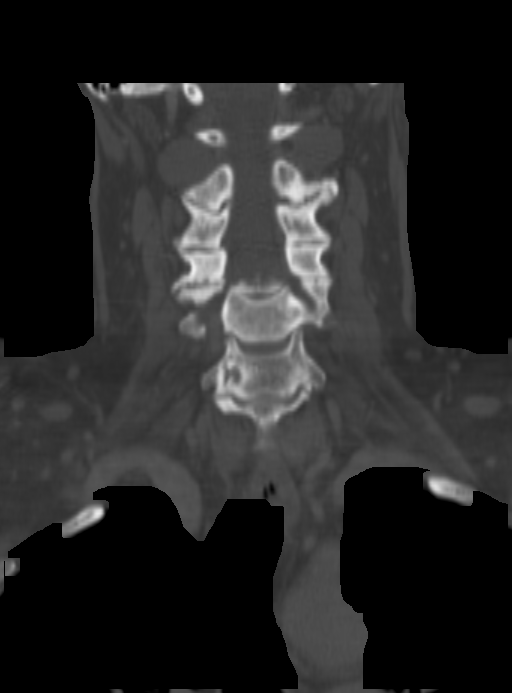

[Series 6: ax oropharynx · axial · 0.34mm/px · 1 of 143 slices shown]
[im 29/143  bone]
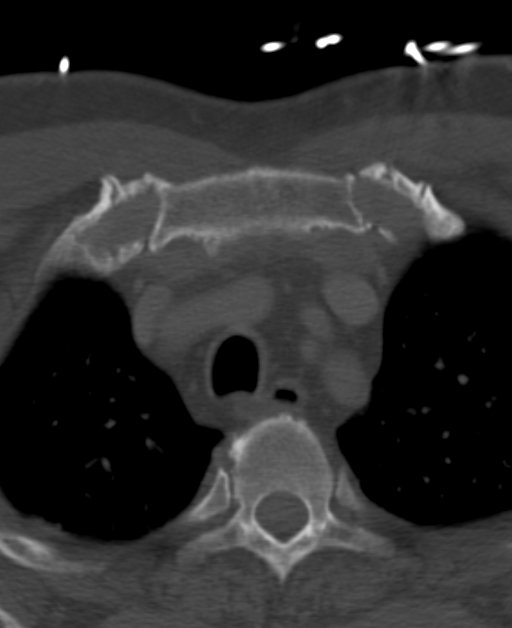

[13 of 33 positions shown; findings below may reference images not displayed]

FINDINGS: There is mucous membrane thickening in the maxillary antra
bilaterally, greater on the left. Mastoid air cells are not
opacified. Degenerative changes in the cervical spine with prominent
anterior osteophytes at multiple levels.

There is diffuse swelling of the tonsils bilaterally most consistent
with inflammatory or infectious process. Inflammation extends
laterally down to the level of the hyoid and tongue base. There is
focal narrowing of the oropharyngeal airway caused by extrinsic
compression. There is no significant infiltration into the
surrounding peritonsillar fat. No focal fluid collection to suggest
abscess. Asymmetry of the supraglottic structures with opacification
of the left piriform sinuses. This is likely inflammatory but a mass
lesion is not excluded and followup after resolution of acute
process is recommended to exclude an underlying mass lesion.
Calcifications in the right tonsil. Lymph nodes scattered diffusely
throughout the neck bilaterally are not pathologically enlarged and
are likely reactive.

The salivary glands appear symmetrical. No prevertebral soft tissue
swelling. The cervical carotid and jugular vessels are patent
bilaterally. No displacement. The laryngeal structures appear
symmetrical. No significant supraclavicular or anterior mediastinal
lymphadenopathy. Thyroid gland is homogeneous with normal size.
IMPRESSION: Diffuse bilateral tonsillar swelling with edema and swelling
extending laterally along the or pharyngeal structures down to the
level of the hyoid bone. There is opacification of the left piriform
sinus. No discrete abscess. Changes likely represent inflammatory or
infectious process but underlying mass or lymphoproliferative
changes not entirely excluded and followup after resolution of acute
process is recommended. There is focal extrinsic compression of the
or pharyngeal airway with luminal narrowing present.

## 2013-09-18 LAB — CBC WITH DIFFERENTIAL/PLATELET
Basophil %: 0.2 %
Eosinophil %: 0 %
HCT: 38.1 % — ABNORMAL LOW (ref 40.0–52.0)
Lymphocyte #: 0.9 10*3/uL — ABNORMAL LOW (ref 1.0–3.6)
Lymphocyte %: 4.8 %
MCHC: 34.9 g/dL (ref 32.0–36.0)
MCV: 94 fL (ref 80–100)
Monocyte %: 2.4 %
RDW: 13.4 % (ref 11.5–14.5)

## 2013-09-18 LAB — BASIC METABOLIC PANEL
Calcium, Total: 9.1 mg/dL (ref 8.5–10.1)
Chloride: 107 mmol/L (ref 98–107)
Co2: 28 mmol/L (ref 21–32)
EGFR (Non-African Amer.): >60
Glucose: 196 mg/dL — ABNORMAL HIGH (ref 65–99)

## 2013-09-18 LAB — MAGNESIUM: Magnesium: 1.9 mg/dL

## 2013-09-22 LAB — CULTURE, BLOOD (SINGLE)

## 2014-08-06 DIAGNOSIS — N400 Enlarged prostate without lower urinary tract symptoms: Secondary | ICD-10-CM | POA: Insufficient documentation

## 2014-08-06 DIAGNOSIS — E119 Type 2 diabetes mellitus without complications: Secondary | ICD-10-CM | POA: Insufficient documentation

## 2014-08-06 DIAGNOSIS — E785 Hyperlipidemia, unspecified: Secondary | ICD-10-CM | POA: Insufficient documentation

## 2014-08-06 DIAGNOSIS — I10 Essential (primary) hypertension: Secondary | ICD-10-CM | POA: Insufficient documentation

## 2014-08-06 DIAGNOSIS — J302 Other seasonal allergic rhinitis: Secondary | ICD-10-CM | POA: Insufficient documentation

## 2015-02-08 NOTE — Consult Note (Signed)
Full note dictated. Angiodema due to Lisinopril use. Patient improving significantly, and should be fine for d/c home tomorrow unless something changes. He does have a small polyp on the posterior right vocal cord, possibly due to laryngopharyngeal reflux. Would d/c home on Prilosec 40 mg 30 minutes prior to evening meal. Also treat sinusitis with Ceftin 500 mg PO BID for 10 days and fluticasone 2 sprays each nostril daily. f/u with me in 3-4 weeks.   Electronic Signatures: Riley Nearing (MD)  (Signed on 30-Nov-14 12:22)  Authored  Last Updated: 30-Nov-14 12:22 by Riley Nearing (MD)

## 2015-02-08 NOTE — H&P (Signed)
PATIENT NAME:  Oscar [name], Oscar [name] MR#:  [id number] DATE OF BIRTH:  1941/01/24  DATE OF ADMISSION:  09/17/2013  PRIMARY CARE PHYSICIAN:  At [facility].   REFERRING PHYSICIAN:  Dr. Dahlia Client.   CHIEF COMPLAINT:  Throat swelling.   HISTORY OF PRESENT ILLNESS:  The patient is a 74 year old Caucasian male with past medical history of hypertension, hyperlipidemia, and diabetes mellitus, is presenting to the ER with a chief complaint of throat swelling.  The patient is healthy, watching ball game, and went to bedroom to sleep last night, and then he suddenly started developing swelling of both sides of the neck and throat swelling.  The patient's neck and throat swelling has gotten worse, and he felt like his throat is closing up.  He called his brother who drove him to the ER immediately.  In the ER, the patient was given Benadryl 50 mg IV, Solu-Medrol 125 mg IV, and the patient is placed on oxygen.  CAT scan of the neck was also done.  That showed diffuse bilateral tonsillar swelling with edema and swelling extending laterally along the pharyngeal structures down to the level of the hyoid bone.  There is also focal extrinsic compression of the pharyngeal airway with luminal narrowing.  The patient was given IV clindamycin and this is discussed with the ENT physician, Dr. Richardson Landry, who is on-call.  Dr. Richardson Landry, the on-call ENT physician, has recommended to admit the patient to medicine and stop ACE inhibitor as the patient started feeling better after giving IV Benadryl and Solu-Medrol.  During my examination, the patient's swelling in the neck and throat significantly improved, but he still feels like a sticking sensation and narrowing sensation in the back of his throat.  One dose of EpiPen is ordered as the patient is still complaining of throat narrowing sensation.  Denies any chest pain or shortness of breath.  No other complaints.  No similar complaints in the past.  Wife is at bedside.  I personally  have called the pharmacist to add ACE INHIBITORS TO HIS ALLERGY LIST.  The patient is reporting that he is having difficulty to swallow food, and his speech was garbled when he came in, and even during my examination slightly better, but still the speech is not back to normal.   PAST MEDICAL HISTORY:  Hypertension, hyperlipidemia, diabetes mellitus, not on any medications.   PAST SURGICAL HISTORY:  Total knee replacement, knee surgery.   ALLERGIES:  ACE INHIBITORS.   PSYCHOSOCIAL HISTORY:  Lives at home with the wife, smokes 1 pack a day.  Denies alcohol or illicit drug usage.   FAMILY HISTORY:  Mother had history of diabetes mellitus and cervical cancer.  Dad has a history of peptic ulcer disease.   HOME MEDICATIONS: Pravastatin 10 mg once daily, lisinopril 10 mg 1 tablet once daily, has been using lisinopril for several years, Meloxicam p.o. as needed for pain.   REVIEW OF SYSTEMS: CONSTITUTIONAL:  Denies any fever or fatigue.  EYES:  Denies blurry vision, double vision.  EARS, NOSE, THROAT:  Complaining of throat closing sensation and difficulty swallowing.  Denies epistaxis or discharge.   RESPIRATION:  Denies cough, COPD.  CARDIOVASCULAR:  Denies chest pain or shortness of breath or palpitations.  GASTROINTESTINAL:  Denies nausea, vomiting, diarrhea.  GENITOURINARY:  No dysuria or hematuria.  ENDOCRINE:  Denies polyuria, nocturia, or thyroid problems.  Has a history of diabetes mellitus.  HEMATOLOGIC AND LYMPHATIC:  No anemia, easy bruising.  INTEGUMENTARY:  No acne, rash, lesions.  MUSCULOSKELETAL:  No joint pain in the neck and back.  NEUROLOGIC:  Denies any vertigo, ataxia.  PSYCHIATRIC:  No ADD, OCD.   PHYSICAL EXAMINATION: VITAL SIGNS:  Temperature 98.4, pulse 74, respirations 16 to 18, blood pressure 182/86, pulse ox 96% on 2 L.  GENERAL APPEARANCE:  Not in any acute distress during my examination.  Moderately built and nourished.  HEAD, EYES, EARS, NOSE, AND THROAT:   Normocephalic, atraumatic.  Pupils are equally reacting to light and accommodation.  No scleral icterus.  No conjunctival injection.  No sinus tenderness.  Moist mucous membranes.  No edema of the lips.  Tongue is edematous.  Uvula is also edematous, but in midline.  Trachea is midline.  Diffuse bilateral tonsillar swelling with edema.  NECK:  Range of motion of the neck is intact.  No lymphadenopathy. LUNGS:  Clear to auscultation.  There is no accessory muscle usage.  No anterior chest wall tenderness on palpation.  CARDIAC:  S1, S2 normal.  Regular rate and rhythm.  No murmurs.  GASTROINTESTINAL:  Soft.  Bowel sounds are positive in all four quadrants.  Nontender, nondistended.  No hepatosplenomegaly.  No masses.  EXTREMITIES:  No edema.  No cyanosis.  No clubbing. NEUROLOGICAL:  Cranial nerves II through XII are grossly intact.  Awake, alert, oriented x 3.  Motor and sensory are grossly intact.  Reflexes are 2+.  SKIN:  Warm to touch.  Normal turgor.  No rashes.  No lesions.  MUSCULOSKELETAL:  No joint effusion, tenderness, erythema.  PSYCHIATRIC:  Normal mood and affect.   LABORATORY AND IMAGING STUDIES:  CAT scan of the neck with contrast revealed diffuse bilateral tonsillar swelling with edema and swelling extending laterally along the oropharyngeal structures down to the level of the hyoid bone, opacification of the left piriform sinus.  No discrete abscess.  Changes likely represent inflammatory or infectious process, but underlying mass or changes not entirely excluded, and followup after resolution of the acute process is recommended.  There is focal extrinsic compression of the pharyngeal airway with luminal narrowing present.  Glucose 156, BUN 13, creatinine 1.04, sodium 138, potassium 4.0, chloride 108, CO2 of 26, anion gap 5.  GFR greater than 60, serum osmolality 279, calcium 8.6.  WBC 11.3, hemoglobin 14.1, hematocrit is 41.8, platelets 333.    ASSESSMENT AND PLAN:  A 74 year old male  presenting to the Emergency Room with a chief complaint of neck and throat swelling, will be admitted with the following assessment and plan.  1.  Acute angioedema, probably from intake of lisinopril.  Admit the patient to telemetry.  We will monitor and observe closely.  Intravenous Benadryl and Solu-Medrol were given in the Emergency Room.  As the patient is still complaining of throat-closing sensation, though he is clinically better, we will go ahead and give one dose of EpiPen for benefit of doubt.  We will give him intravenous Benadryl q. 6 hours.  Ranitidine q. 12 hours, and we will continue Solu-Medrol 60 mg intravenous q. 6 hours.  We will consider ears, nose, and throat consult with Dr. Richardson Landry on p.r.n. basis.  2.  Leukocytosis.  This is probably from inflammatory condition.  We will continue close monitoring.   The patient is afebrile at this time.  3.  Hypertension.  Discontinue lisinopril in view of angioedema, started him on metoprolol.  4.  Hyperlipidemia.  Continue his home medication statin once the patient is able to swallow pills.  5.  Diabetes mellitus.  The patient will be  on sliding scale insulin.  6.  We will provide him gastrointestinal prophylaxis with ranitidine and deep vein thrombosis prophylaxis with Lovenox subcutaneous.   Diagnosis and plan of care was discussed in detail with the patient and his wife at bedside.  They both verbalized understanding of the plan.   CODE STATUS:  HE IS FULL CODE.  Wife has medical power of attorney.   Total critical care time spent is 45 minutes.     ____________________________ Nicholes Mango, MD ag:ea D: 09/17/2013 05:14:00 ET T: 09/17/2013 06:05:02 ET JOB#: [id number]  cc: Nicholes Mango, MD, <Dictator> Nicholes Mango MD ELECTRONICALLY SIGNED 09/20/2013 7:24

## 2015-02-08 NOTE — Discharge Summary (Signed)
PATIENT NAME:  Oscar [name], Oscar [name] MR#:  [id number] DATE OF BIRTH:  Feb 22, 1941  DATE OF ADMISSION:  09/17/2013 DATE OF DISCHARGE:  09/18/2013  DISCHARGE DIAGNOSES: 1.  Angioedema, ACE inhibitor allergy. 2.  Hypertension.  3.  Elevated blood sugar and white blood cell. 4.  Hyperlipidemia.  5.  Vocal cord polyp. 6.  Sinusitis.   CONDITION ON DISCHARGE: Stable.   CODE STATUS: FULL code.  DISCHARGE MEDICATIONS: 1.  Pravastatin 10 mg once a day.  2.  Metoprolol 25 mg 2 times a day.  3.  Amlodipine 5 mg once a day.  4.  Cefuroxime 500 mg every 12 hours for 4 days.  5.  Fluticasone 2 sprays nasally once a day.  6.  Omeprazole 20 mg delayed-release once a day.   DIET ON DISCHARGE: Low-sodium.   ACTIVITY: As tolerated.  DISCHARGE INSTRUCTIONS: Advised to follow up in 2 to 4 weeks in the ENT clinic for vocal cord polyp with Dr. Clyde Canterbury at [facility].  HISTORY OF PRESENT ILLNESS: As per H and P by Dr. Margaretmary Eddy on 09/17/2013.   HOSPITAL COURSE: A 74 year old Caucasian male with past medical history of hypertension, hyperlipidemia, and diabetes who came to the ER with complaint of throat swelling. He woke up in the night, started developing swelling on both side of the neck and throat swelling, felt like throat is closing up and called his brother who drove him to the Emergency Room immediately. He was given Benadryl 50 mg and IV Solu-Medrol 125 mg and placed on oxygen. CAT scan of the neck showed diffuse bilateral tonsillar swelling with edema, swelling extending laterally along the pharyngeal structure down to the level of hyoid bone. No focal or extrinsic compression on pharyngeal airway with minimal narrowing. Was given IV clindamycin and ENT consult was called in. ENT, Dr. Richardson Landry, did laryngoscopy examination and found there is some laryngeal polyp, but otherwise no significant fluctuance or swelling and so advised to continue the therapy for 1 more day. There was some component of  sinusitis so advised to have treatment with cefuroxime for that and stop his ACE inhibitor and follow up in the ENT clinic for his vocal cord polyp.   OTHER MEDICAL ISSUES: 1.  As mentioned above, the patient's swelling improved a lot and was feeling much better the next day so we discharged him home and changed his hypertensive medication to metoprolol for better control.  2.  Elevated blood sugar and elevated WBC level. These will possibly due to stress and steroid use in the hospital. 3.  Hyperlipidemia. We continued statin and discharged him home.   TOTAL TIME SPENT ON THIS DISCHARGE: 40 minutes.  ____________________________ Ceasar Lund Anselm Jungling, MD vgv:sb D: 09/19/2013 16:06:50 ET T: 09/19/2013 16:36:34 ET JOB#: [id number]  cc: Ceasar Lund. Anselm Jungling, MD, <Dictator> [place] Richardson Landry, MD Vaughan Basta MD ELECTRONICALLY SIGNED 09/26/2013 9:18

## 2015-02-08 NOTE — Consult Note (Signed)
PATIENT NAME:  Oscar [name], Oscar [name] MR#:  [id number] DATE OF BIRTH:  03-Aug-1941  DATE OF CONSULTATION:  09/17/2013  REFERRING PHYSICIAN:  Nicholes Mango, MD CONSULTING PHYSICIAN:  Sammuel Hines. Richardson Landry, MD  REASON FOR CONSULTATION: Angioedema.   HISTORY OF PRESENT ILLNESS: This 74 year old male presented to the emergency last night with acute onset of edema involving the neck, throat and tongue. He was treated with Benadryl and Solu-Medrol and ultimately showed improvement. He has been on an ACE inhibitor, lisinopril, for several years. He did not have any sore throat other than some minor irritation and says he had basically just gone to sleep last night and woke up suddenly with the neck and throat swelling. CT scan showed tonsil and tongue edema the with edema extending down the level of hyoid bone and some airway narrowing. In addition to the above drugs, he was given some IV clindamycin. The scan did show some sinusitis involving the left maxillary sinus. He had had a little bit of nasal congestion and drainage beginning last week but really nothing substantial in that regard, certainly  no facial pain or fever. He denies any reflux but has had some occasional hoarseness and in particular dry cough for the past 3 weeks. He is also notably a smoker for many years. Since he has been in the hospital, he has improved in terms of his edema. He is not having any difficulty breathing and his swallowing is much improved. He was initially in the CCU but was transferred over this morning to the floor. Initially, when he came in, his speech was muffled but that has returned almost to normal this morning.   PAST MEDICAL HISTORY: Hypertension, hyperlipidemia, diabetes mellitus though he is not on medications for this.   PAST SURGICAL HISTORY:  Total knee replacement.   ALLERGIES: ACE INHIBITORS.   SOCIAL HISTORY: He smokes a pack of cigarettes a day for many years. Denies alcohol or drug use. He lives at home with his  wife.   FAMILY HISTORY: Mother had diabetes and cervical cancer. His father had peptic ulcer disease.   MEDICATIONS: Lisinopril 10 mg p.o. daily. He takes meloxicam as needed for arthritic pain. Pravastatin 10 mg p.o. daily.   REVIEW OF SYSTEMS: He denies any fever, blurred vision, double vision, purulent nasal discharge, facial pain, chest pain or shortness of breath. No nausea, vomiting, diarrhea. No sore throat. No nocturia. No rash or itching. No vertigo or hearing change. No pain in the neck. He does say his neck still feels a little swollen at this point.    PHYSICAL EXAM: VITAL SIGNS: Temperature 97.8, temperature was 36.5, pulse 73, blood pressure is 186/91, oxygen saturation 96%.  GENERAL: Well-developed, well-nourished, moderately obese male in no acute distress with no stridor or hoarseness.  HEAD AND FACE: Head is normocephalic, atraumatic. There are no facial skin lesions. Facial strength is normal and symmetric.  EARS: External ears, ear canals, tympanic membranes are clear bilaterally with no middle ear effusion or infection.  NASAL EXAM: External nose unremarkable. Nasal cavity reveals mild leftward septal deviation and moderate congestion with clear secretions. No polyps are seen. There is no gross purulence.  ORAL CAVITY AND OROPHARYNX: He wears an upper denture. The tongue and floor of mouth are without edema. Lower teeth are unremarkable with no sign of infection. Posterior pharynx is clear with no erythema, exudate or visible swelling. Tonsils are 2+ in size. Indirect laryngoscopy is not possible in this setting.  NECK: Neck is supple without adenopathy  or mass. There is still a little bit of submental edema but no fluctuance. He is mildly tender in that region to palpation. Salivary glands are soft and nontender without masses. There is no thyromegaly palpable. Trachea is midline with no evidence of shift.  NEUROLOGIC: Cranial nerves II through XII are grossly intact.    PROCEDURE NOTE: PREOPERATIVE DIAGNOSIS: Angioedema with dysphagia, throat swelling.  POSTOPERATIVE DIAGNOSIS: Angioedema with dysphagia, throat swelling.  PROCEDURE: Flexible fiberoptic nasal laryngoscopy.  SURGEON: Malon Kindle, MD ANESTHESIA: Topical 4% lidocaine.  DESCRIPTION OF PROCEDURE: After discussing the procedure with the patient, the nose was anesthetized on the right side with topical 4% lidocaine. A flexible fiberoptic scope was passed through the right nasal cavity. Nasal cavity was free of lesions or purulence. The nasopharynx was clear. The hypopharynx, larynx and tongue base were carefully inspected. There was no visible swelling at the base of the tongue except some mild edema in the vallecula but the remainder of the epiglottis is free of significant edema. There is some edema of the region around the arytenoids and piriform sinuses, slightly more prominent on the left than the right, but this is not producing any airway obstruction. Vocal cords are mobile and free of edema although there is a small polypoid lesion on the posterior aspect of the right vocal cord. There is some edema of the posterior glottis. The scope was withdrawn. The patient tolerated the procedure well.   ASSESSMENT: This patient has had an episode of angioedema edema most likely secondary to his lisinopril. He is also noted to have left-sided maxillary sinusitis on a CT scan as well as a small polypoid lesion on the posterior aspect of the larynx. I suspect he may have some chronic laryngopharyngeal reflux that has contributed to this. The lesion does look benign, however. He has had a dry cough which could be either secondary to postnasal drainage from the sinusitis or a reflection of his laryngopharyngeal reflux.   PLAN: Obviously, he needs to be kept off of ACE inhibitors. He is rapidly improving and his airway is completely patent at this point with just some residual throat edema which is not concerning  from an airway perspective. I expect he will continue to improve with steroids and more time. If he continues to improve, he can be discharged tomorrow but I would definitely watch him for the day. I am going to put him on omeprazole 40 mg prior to his evening meal for the reflux and plan on following up to recheck the small laryngeal polyp. Covering with antibiotics is appropriate for sinusitis and a nasal steroid spray would be prudent as well. He was given clindamycin last night. A cephalosporin such as Omnicef or Ceftin would be appropriate at this point. Currently, he is on Zantac 150 mg twice daily but I would recommend discharging him on the omeprazole dose prior to his evening meal after release from the hospital. Certainly, the Zantac is fine for now and may help cover for any histamine response. I would like to see him back in the office in 3 weeks to recheck the vocal cord polyp.   ____________________________ Sammuel Hines. Richardson Landry, MD psb:cs D: 09/17/2013 12:19:14 ET T: 09/17/2013 15:08:27 ET JOB#: [id number]  cc: Sammuel Hines. Richardson Landry, MD, <Dictator> Riley Nearing MD ELECTRONICALLY SIGNED 09/19/2013 7:47

## 2015-02-11 DIAGNOSIS — E780 Pure hypercholesterolemia: Secondary | ICD-10-CM | POA: Diagnosis not present

## 2015-02-11 DIAGNOSIS — I10 Essential (primary) hypertension: Secondary | ICD-10-CM | POA: Diagnosis not present

## 2015-02-11 DIAGNOSIS — N400 Enlarged prostate without lower urinary tract symptoms: Secondary | ICD-10-CM | POA: Diagnosis not present

## 2015-02-11 DIAGNOSIS — E119 Type 2 diabetes mellitus without complications: Secondary | ICD-10-CM | POA: Diagnosis not present

## 2015-08-21 DIAGNOSIS — I10 Essential (primary) hypertension: Secondary | ICD-10-CM | POA: Diagnosis not present

## 2015-08-21 DIAGNOSIS — G8929 Other chronic pain: Secondary | ICD-10-CM | POA: Diagnosis not present

## 2015-08-21 DIAGNOSIS — M546 Pain in thoracic spine: Secondary | ICD-10-CM | POA: Diagnosis not present

## 2015-08-21 DIAGNOSIS — E119 Type 2 diabetes mellitus without complications: Secondary | ICD-10-CM | POA: Diagnosis not present

## 2015-08-21 DIAGNOSIS — N400 Enlarged prostate without lower urinary tract symptoms: Secondary | ICD-10-CM | POA: Diagnosis not present

## 2015-08-21 DIAGNOSIS — E7800 Pure hypercholesterolemia, unspecified: Secondary | ICD-10-CM | POA: Diagnosis not present

## 2016-02-24 DIAGNOSIS — E119 Type 2 diabetes mellitus without complications: Secondary | ICD-10-CM | POA: Diagnosis not present

## 2016-02-24 DIAGNOSIS — E7800 Pure hypercholesterolemia, unspecified: Secondary | ICD-10-CM | POA: Diagnosis not present

## 2016-02-24 DIAGNOSIS — G8929 Other chronic pain: Secondary | ICD-10-CM | POA: Diagnosis not present

## 2016-02-24 DIAGNOSIS — M546 Pain in thoracic spine: Secondary | ICD-10-CM | POA: Diagnosis not present

## 2016-02-24 DIAGNOSIS — I10 Essential (primary) hypertension: Secondary | ICD-10-CM | POA: Diagnosis not present

## 2016-02-24 DIAGNOSIS — N400 Enlarged prostate without lower urinary tract symptoms: Secondary | ICD-10-CM | POA: Diagnosis not present

## 2016-03-06 ENCOUNTER — Emergency Department
Admission: EM | Admit: 2016-03-06 | Discharge: 2016-03-06 | Disposition: A | Discharge status: Home / Self Care | Payer: Commercial Managed Care - HMO | Attending: Emergency Medicine | Admitting: Emergency Medicine

## 2016-03-06 ENCOUNTER — Emergency Department: Payer: Commercial Managed Care - HMO

## 2016-03-06 ENCOUNTER — Encounter: Payer: Self-pay | Admitting: Emergency Medicine

## 2016-03-06 DIAGNOSIS — Y92410 Unspecified street and highway as the place of occurrence of the external cause: Secondary | ICD-10-CM | POA: Diagnosis not present

## 2016-03-06 DIAGNOSIS — R55 Syncope and collapse: Secondary | ICD-10-CM | POA: Diagnosis not present

## 2016-03-06 DIAGNOSIS — M25511 Pain in right shoulder: Secondary | ICD-10-CM | POA: Diagnosis not present

## 2016-03-06 DIAGNOSIS — M542 Cervicalgia: Secondary | ICD-10-CM | POA: Diagnosis not present

## 2016-03-06 DIAGNOSIS — S0990XA Unspecified injury of head, initial encounter: Secondary | ICD-10-CM | POA: Diagnosis present

## 2016-03-06 DIAGNOSIS — E119 Type 2 diabetes mellitus without complications: Secondary | ICD-10-CM | POA: Insufficient documentation

## 2016-03-06 DIAGNOSIS — Y999 Unspecified external cause status: Secondary | ICD-10-CM | POA: Insufficient documentation

## 2016-03-06 DIAGNOSIS — S299XXA Unspecified injury of thorax, initial encounter: Secondary | ICD-10-CM | POA: Diagnosis not present

## 2016-03-06 DIAGNOSIS — F17200 Nicotine dependence, unspecified, uncomplicated: Secondary | ICD-10-CM | POA: Insufficient documentation

## 2016-03-06 DIAGNOSIS — R079 Chest pain, unspecified: Secondary | ICD-10-CM | POA: Diagnosis not present

## 2016-03-06 DIAGNOSIS — S0502XA Injury of conjunctiva and corneal abrasion without foreign body, left eye, initial encounter: Secondary | ICD-10-CM

## 2016-03-06 DIAGNOSIS — I10 Essential (primary) hypertension: Secondary | ICD-10-CM | POA: Diagnosis not present

## 2016-03-06 DIAGNOSIS — Z79899 Other long term (current) drug therapy: Secondary | ICD-10-CM | POA: Insufficient documentation

## 2016-03-06 DIAGNOSIS — E785 Hyperlipidemia, unspecified: Secondary | ICD-10-CM | POA: Insufficient documentation

## 2016-03-06 DIAGNOSIS — S0083XA Contusion of other part of head, initial encounter: Secondary | ICD-10-CM | POA: Insufficient documentation

## 2016-03-06 DIAGNOSIS — S199XXA Unspecified injury of neck, initial encounter: Secondary | ICD-10-CM | POA: Diagnosis not present

## 2016-03-06 DIAGNOSIS — M549 Dorsalgia, unspecified: Secondary | ICD-10-CM | POA: Diagnosis not present

## 2016-03-06 DIAGNOSIS — S29019A Strain of muscle and tendon of unspecified wall of thorax, initial encounter: Secondary | ICD-10-CM | POA: Insufficient documentation

## 2016-03-06 DIAGNOSIS — Y9389 Activity, other specified: Secondary | ICD-10-CM | POA: Diagnosis not present

## 2016-03-06 HISTORY — DX: Type 2 diabetes mellitus without complications: E11.9

## 2016-03-06 HISTORY — DX: Hyperlipidemia, unspecified: E78.5

## 2016-03-06 HISTORY — DX: Essential (primary) hypertension: I10

## 2016-03-06 HISTORY — DX: Unspecified osteoarthritis, unspecified site: M19.90

## 2016-03-06 LAB — BASIC METABOLIC PANEL
ANION GAP: 5 (ref 5–15)
BUN: 10 mg/dL (ref 6–20)
CALCIUM: 8.8 mg/dL — AB (ref 8.9–10.3)
CO2: 25 mmol/L (ref 22–32)
CREATININE: 0.91 mg/dL (ref 0.61–1.24)
Chloride: 107 mmol/L (ref 101–111)
Glucose, Bld: 163 mg/dL — ABNORMAL HIGH (ref 65–99)
Potassium: 5.2 mmol/L — ABNORMAL HIGH (ref 3.5–5.1)
SODIUM: 137 mmol/L (ref 135–145)

## 2016-03-06 LAB — CBC WITH DIFFERENTIAL/PLATELET
BASOS ABS: 0.1 10*3/uL (ref 0–0.1)
EOS ABS: 0.2 10*3/uL (ref 0–0.7)
HCT: 41.4 % (ref 40.0–52.0)
Hemoglobin: 14.5 g/dL (ref 13.0–18.0)
Lymphocytes Relative: 8 %
Lymphs Abs: 1 10*3/uL (ref 1.0–3.6)
MCH: 33 pg (ref 26.0–34.0)
MCHC: 34.9 g/dL (ref 32.0–36.0)
MCV: 94.4 fL (ref 80.0–100.0)
MONO ABS: 0.7 10*3/uL (ref 0.2–1.0)
Monocytes Relative: 6 %
NEUTROS ABS: 9.7 10*3/uL — AB (ref 1.4–6.5)
Neutrophils Relative %: 83 %
PLATELETS: 249 10*3/uL (ref 150–440)
RBC: 4.39 MIL/uL — ABNORMAL LOW (ref 4.40–5.90)
RDW: 13.7 % (ref 11.5–14.5)
WBC: 11.6 10*3/uL — ABNORMAL HIGH (ref 3.8–10.6)
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
## 2016-03-06 MED ORDER — OXYCODONE-ACETAMINOPHEN 5-325 MG PO TABS: 2.0000 | ORAL_TABLET | Freq: Four times a day (QID) | ORAL | Status: DC | PRN

## 2016-03-06 MED ORDER — MORPHINE SULFATE (PF) 4 MG/ML IV SOLN
4.0000 mg | Freq: Once | INTRAVENOUS | Status: AC
Start: 2016-03-06 — End: 2016-03-06
  Administered 2016-03-06: 4 mg via INTRAVENOUS
  Filled 2016-03-06: qty 1

## 2016-03-06 MED ORDER — TETRACAINE HCL 0.5 % OP SOLN
2.0000 [drp] | Freq: Once | OPHTHALMIC | Status: DC
  Filled 2016-03-06: qty 2

## 2016-03-06 MED ORDER — MORPHINE SULFATE (PF) 4 MG/ML IV SOLN
4.0000 mg | Freq: Once | INTRAVENOUS | Status: AC
  Administered 2016-03-06: 4 mg via INTRAVENOUS
  Filled 2016-03-06: qty 1

## 2016-03-06 MED ORDER — FLUORESCEIN SODIUM 1 MG OP STRP
1.0000 | ORAL_STRIP | Freq: Once | OPHTHALMIC | Status: AC
  Administered 2016-03-06: 1 via OPHTHALMIC

## 2016-03-06 MED ORDER — IOPAMIDOL (ISOVUE-300) INJECTION 61%
75.0000 mL | Freq: Once | INTRAVENOUS | Status: AC | PRN
Start: 2016-03-06 — End: 2016-03-06
  Administered 2016-03-06: 75 mL via INTRAVENOUS

## 2016-03-06 MED ORDER — CYCLOBENZAPRINE HCL 10 MG PO TABS: 10.0000 mg | ORAL_TABLET | Freq: Three times a day (TID) | ORAL | Status: DC | PRN

## 2016-03-06 MED ORDER — TETRACAINE HCL 0.5 % OP SOLN
2.0000 [drp] | Freq: Once | OPHTHALMIC | Status: AC
  Administered 2016-03-06: 2 [drp] via OPHTHALMIC

## 2016-03-06 MED ORDER — FLUORESCEIN SODIUM 1 MG OP STRP
ORAL_STRIP | OPHTHALMIC | Status: AC
  Administered 2016-03-06: 1 via OPHTHALMIC
  Filled 2016-03-06: qty 1

## 2016-03-06 MED ORDER — KETOROLAC TROMETHAMINE 0.5 % OP SOLN: 1.0000 [drp] | Freq: Four times a day (QID) | OPHTHALMIC | Status: DC
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
## 2016-03-09 DIAGNOSIS — S29019A Strain of muscle and tendon of unspecified wall of thorax, initial encounter: Secondary | ICD-10-CM | POA: Diagnosis not present

## 2016-03-09 DIAGNOSIS — S0502XA Injury of conjunctiva and corneal abrasion without foreign body, left eye, initial encounter: Secondary | ICD-10-CM | POA: Diagnosis not present

## 2016-03-11 ENCOUNTER — Encounter: Payer: Self-pay | Admitting: Emergency Medicine

## 2016-03-11 ENCOUNTER — Emergency Department
Admission: EM | Admit: 2016-03-11 | Discharge: 2016-03-11 | Disposition: A | Discharge status: Home / Self Care | Payer: Commercial Managed Care - HMO | Attending: Emergency Medicine | Admitting: Emergency Medicine

## 2016-03-11 ENCOUNTER — Emergency Department: Payer: Commercial Managed Care - HMO

## 2016-03-11 DIAGNOSIS — M1990 Unspecified osteoarthritis, unspecified site: Secondary | ICD-10-CM | POA: Insufficient documentation

## 2016-03-11 DIAGNOSIS — Z79899 Other long term (current) drug therapy: Secondary | ICD-10-CM | POA: Insufficient documentation

## 2016-03-11 DIAGNOSIS — Y939 Activity, unspecified: Secondary | ICD-10-CM | POA: Insufficient documentation

## 2016-03-11 DIAGNOSIS — Y92410 Unspecified street and highway as the place of occurrence of the external cause: Secondary | ICD-10-CM | POA: Insufficient documentation

## 2016-03-11 DIAGNOSIS — Y999 Unspecified external cause status: Secondary | ICD-10-CM | POA: Diagnosis not present

## 2016-03-11 DIAGNOSIS — E119 Type 2 diabetes mellitus without complications: Secondary | ICD-10-CM | POA: Insufficient documentation

## 2016-03-11 DIAGNOSIS — S29012A Strain of muscle and tendon of back wall of thorax, initial encounter: Secondary | ICD-10-CM | POA: Insufficient documentation

## 2016-03-11 DIAGNOSIS — I10 Essential (primary) hypertension: Secondary | ICD-10-CM | POA: Insufficient documentation

## 2016-03-11 DIAGNOSIS — S299XXA Unspecified injury of thorax, initial encounter: Secondary | ICD-10-CM | POA: Diagnosis not present

## 2016-03-11 DIAGNOSIS — E785 Hyperlipidemia, unspecified: Secondary | ICD-10-CM | POA: Insufficient documentation

## 2016-03-11 DIAGNOSIS — S29019A Strain of muscle and tendon of unspecified wall of thorax, initial encounter: Secondary | ICD-10-CM

## 2016-03-11 DIAGNOSIS — S3992XA Unspecified injury of lower back, initial encounter: Secondary | ICD-10-CM | POA: Diagnosis not present

## 2016-03-11 DIAGNOSIS — S39012A Strain of muscle, fascia and tendon of lower back, initial encounter: Secondary | ICD-10-CM | POA: Diagnosis not present

## 2016-03-11 DIAGNOSIS — M545 Low back pain: Secondary | ICD-10-CM | POA: Diagnosis not present

## 2016-03-11 DIAGNOSIS — Z791 Long term (current) use of non-steroidal anti-inflammatories (NSAID): Secondary | ICD-10-CM | POA: Insufficient documentation

## 2016-03-11 DIAGNOSIS — F17210 Nicotine dependence, cigarettes, uncomplicated: Secondary | ICD-10-CM | POA: Diagnosis not present

## 2016-03-11 DIAGNOSIS — M546 Pain in thoracic spine: Secondary | ICD-10-CM | POA: Diagnosis not present
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
## 2016-03-11 MED ORDER — OXYCODONE-ACETAMINOPHEN 5-325 MG PO TABS: 1.0000 | ORAL_TABLET | ORAL | Status: DC | PRN

## 2016-03-11 MED ORDER — MORPHINE SULFATE (PF) 4 MG/ML IV SOLN
4.0000 mg | Freq: Once | INTRAVENOUS | Status: AC
  Administered 2016-03-11: 4 mg via INTRAVENOUS
  Filled 2016-03-11 (×2): qty 1

## 2016-03-11 MED ORDER — DIAZEPAM 2 MG PO TABS: 2.0000 mg | ORAL_TABLET | Freq: Three times a day (TID) | ORAL | Status: DC | PRN

## 2016-03-11 MED ORDER — IBUPROFEN 800 MG PO TABS: 800.0000 mg | ORAL_TABLET | Freq: Three times a day (TID) | ORAL | Status: DC | PRN

## 2016-03-11 MED ORDER — DIAZEPAM 2 MG PO TABS
2.0000 mg | ORAL_TABLET | Freq: Once | ORAL | Status: AC
  Administered 2016-03-11: 2 mg via ORAL
  Filled 2016-03-11 (×2): qty 1

## 2016-03-11 MED ORDER — ONDANSETRON HCL 4 MG/2ML IJ SOLN
4.0000 mg | Freq: Once | INTRAMUSCULAR | Status: AC
  Administered 2016-03-11: 4 mg via INTRAVENOUS
  Filled 2016-03-11 (×2): qty 2

## 2016-03-11 NOTE — ED Notes (Signed)
AAOx3.  Skin warm and dry.  D/c home with family.
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
## 2016-03-11 NOTE — ED Notes (Signed)
Patient's family is down in the cafeteria eating breakfast.  Will discharge once family returns.

## 2016-03-11 NOTE — Discharge Instructions (Signed)
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  Always ask for help when lifting heavy or awkward objects. INCORRECT LIFTING TECHNIQUES DO NOT:   Lock your knees when lifting, even if it is a small object.  Bend and twist. Pivot at your feet or move your feet when needing to change directions.  Assume that  you can safely pick up even a paperclip without proper posture.   This information is not intended to replace advice given to you by your health care provider. Make sure you discuss any questions you have with your health care provider.   Document Released: 10/05/2005 Document Revised: 10/26/2014 Document Reviewed: 01/17/2009 Elsevier Interactive Patient Education [employer].
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
## 2016-03-11 NOTE — ED Notes (Signed)
Patient transported to X-ray 
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
## 2016-09-01 DIAGNOSIS — I10 Essential (primary) hypertension: Secondary | ICD-10-CM | POA: Diagnosis not present

## 2016-09-01 DIAGNOSIS — Z23 Encounter for immunization: Secondary | ICD-10-CM | POA: Diagnosis not present

## 2016-09-01 DIAGNOSIS — M546 Pain in thoracic spine: Secondary | ICD-10-CM | POA: Diagnosis not present

## 2016-09-01 DIAGNOSIS — E7800 Pure hypercholesterolemia, unspecified: Secondary | ICD-10-CM | POA: Diagnosis not present

## 2016-09-01 DIAGNOSIS — G8929 Other chronic pain: Secondary | ICD-10-CM | POA: Diagnosis not present

## 2016-09-01 DIAGNOSIS — E119 Type 2 diabetes mellitus without complications: Secondary | ICD-10-CM | POA: Diagnosis not present

## 2017-09-20 DIAGNOSIS — I10 Essential (primary) hypertension: Secondary | ICD-10-CM | POA: Diagnosis not present

## 2017-09-20 DIAGNOSIS — G8929 Other chronic pain: Secondary | ICD-10-CM | POA: Diagnosis not present

## 2017-09-20 DIAGNOSIS — E119 Type 2 diabetes mellitus without complications: Secondary | ICD-10-CM | POA: Diagnosis not present

## 2017-09-20 DIAGNOSIS — E7800 Pure hypercholesterolemia, unspecified: Secondary | ICD-10-CM | POA: Diagnosis not present

## 2017-09-20 DIAGNOSIS — M546 Pain in thoracic spine: Secondary | ICD-10-CM | POA: Diagnosis not present

## 2017-09-24 DIAGNOSIS — E119 Type 2 diabetes mellitus without complications: Secondary | ICD-10-CM | POA: Diagnosis not present

## 2017-09-24 DIAGNOSIS — E7800 Pure hypercholesterolemia, unspecified: Secondary | ICD-10-CM | POA: Diagnosis not present

## 2018-01-07 DIAGNOSIS — M546 Pain in thoracic spine: Secondary | ICD-10-CM | POA: Diagnosis not present

## 2018-01-07 DIAGNOSIS — I10 Essential (primary) hypertension: Secondary | ICD-10-CM | POA: Diagnosis not present

## 2018-01-07 DIAGNOSIS — E1165 Type 2 diabetes mellitus with hyperglycemia: Secondary | ICD-10-CM | POA: Diagnosis not present

## 2018-01-07 DIAGNOSIS — G8929 Other chronic pain: Secondary | ICD-10-CM | POA: Diagnosis not present

## 2018-01-07 DIAGNOSIS — E7800 Pure hypercholesterolemia, unspecified: Secondary | ICD-10-CM | POA: Diagnosis not present

## 2018-04-16 ENCOUNTER — Other Ambulatory Visit: Payer: Self-pay

## 2018-04-16 ENCOUNTER — Ambulatory Visit (INDEPENDENT_AMBULATORY_CARE_PROVIDER_SITE_OTHER): Payer: Medicare HMO

## 2018-04-16 ENCOUNTER — Ambulatory Visit
Admission: EM | Admit: 2018-04-16 | Discharge: 2018-04-16 | Disposition: A | Discharge status: Home / Self Care | Payer: Medicare HMO | Attending: Family Medicine | Admitting: Family Medicine

## 2018-04-16 ENCOUNTER — Encounter: Payer: Self-pay | Admitting: Gynecology

## 2018-04-16 DIAGNOSIS — F17210 Nicotine dependence, cigarettes, uncomplicated: Secondary | ICD-10-CM | POA: Diagnosis not present

## 2018-04-16 DIAGNOSIS — E785 Hyperlipidemia, unspecified: Secondary | ICD-10-CM | POA: Diagnosis not present

## 2018-04-16 DIAGNOSIS — N281 Cyst of kidney, acquired: Secondary | ICD-10-CM | POA: Diagnosis not present

## 2018-04-16 DIAGNOSIS — I700 Atherosclerosis of aorta: Secondary | ICD-10-CM | POA: Insufficient documentation

## 2018-04-16 DIAGNOSIS — N132 Hydronephrosis with renal and ureteral calculous obstruction: Secondary | ICD-10-CM | POA: Diagnosis not present

## 2018-04-16 DIAGNOSIS — E119 Type 2 diabetes mellitus without complications: Secondary | ICD-10-CM | POA: Diagnosis not present

## 2018-04-16 DIAGNOSIS — Z79899 Other long term (current) drug therapy: Secondary | ICD-10-CM | POA: Insufficient documentation

## 2018-04-16 DIAGNOSIS — M47817 Spondylosis without myelopathy or radiculopathy, lumbosacral region: Secondary | ICD-10-CM | POA: Insufficient documentation

## 2018-04-16 DIAGNOSIS — N201 Calculus of ureter: Secondary | ICD-10-CM

## 2018-04-16 DIAGNOSIS — R1032 Left lower quadrant pain: Secondary | ICD-10-CM | POA: Diagnosis not present

## 2018-04-16 DIAGNOSIS — I10 Essential (primary) hypertension: Secondary | ICD-10-CM | POA: Insufficient documentation

## 2018-04-16 DIAGNOSIS — R109 Unspecified abdominal pain: Secondary | ICD-10-CM | POA: Diagnosis not present

## 2018-04-16 DIAGNOSIS — Z7984 Long term (current) use of oral hypoglycemic drugs: Secondary | ICD-10-CM | POA: Insufficient documentation

## 2018-04-16 DIAGNOSIS — R1110 Vomiting, unspecified: Secondary | ICD-10-CM | POA: Diagnosis not present

## 2018-04-16 DIAGNOSIS — Z7951 Long term (current) use of inhaled steroids: Secondary | ICD-10-CM | POA: Insufficient documentation

## 2018-04-16 LAB — CBC WITH DIFFERENTIAL/PLATELET
BASOS ABS: 0.1 10*3/uL (ref 0–0.1)
Basophils Relative: 1 %
EOS PCT: 1 %
Eosinophils Absolute: 0.1 10*3/uL (ref 0–0.7)
HEMATOCRIT: 43.7 % (ref 40.0–52.0)
Hemoglobin: 15.3 g/dL (ref 13.0–18.0)
LYMPHS ABS: 1.5 10*3/uL (ref 1.0–3.6)
LYMPHS PCT: 13 %
MCH: 32.9 pg (ref 26.0–34.0)
MCHC: 34.9 g/dL (ref 32.0–36.0)
MCV: 94.3 fL (ref 80.0–100.0)
MONO ABS: 0.8 10*3/uL (ref 0.2–1.0)
MONOS PCT: 7 %
NEUTROS ABS: 9 10*3/uL — AB (ref 1.4–6.5)
Neutrophils Relative %: 78 %
PLATELETS: 251 10*3/uL (ref 150–440)
RBC: 4.64 MIL/uL (ref 4.40–5.90)
RDW: 12.8 % (ref 11.5–14.5)
WBC: 11.5 10*3/uL — ABNORMAL HIGH (ref 3.8–10.6)

## 2018-04-16 LAB — COMPREHENSIVE METABOLIC PANEL
ALT: 15 U/L (ref 0–44)
AST: 28 U/L (ref 15–41)
Albumin: 4.2 g/dL (ref 3.5–5.0)
Alkaline Phosphatase: 87 U/L (ref 38–126)
Anion gap: 13 (ref 5–15)
BILIRUBIN TOTAL: 0.5 mg/dL (ref 0.3–1.2)
BUN: 13 mg/dL (ref 8–23)
CHLORIDE: 102 mmol/L (ref 98–111)
CO2: 23 mmol/L (ref 22–32)
CREATININE: 1.31 mg/dL — AB (ref 0.61–1.24)
Calcium: 8.9 mg/dL (ref 8.9–10.3)
GFR calc Af Amer: >60 mL/min (ref >60)
GFR, EST NON AFRICAN AMERICAN: 52 mL/min — AB (ref >60)
Glucose, Bld: 190 mg/dL — ABNORMAL HIGH (ref 70–99)
POTASSIUM: 4 mmol/L (ref 3.5–5.1)
Sodium: 138 mmol/L (ref 135–145)
TOTAL PROTEIN: 7.8 g/dL (ref 6.5–8.1)

## 2018-04-16 IMAGING — CT CT ABD-PELV W/ CM
1 of 3 series · 14 of 32 positions shown, 19 images · IV contrast (APPLIED)
Comparison: Body CT [DATE]

CLINICAL DATA: Left-sided abdominal pain, nausea vomiting.

EXAM:
CT ABDOMEN AND PELVIS WITH CONTRAST
TECHNIQUE: Multidetector CT imaging of the abdomen and pelvis was performed
using the standard protocol following bolus administration of
intravenous contrast.
CONTRAST:  80mL [K9] IOPAMIDOL ([K9]) INJECTION 61%

[Series 2: axial st · axial · 0.86mm/px · z∈[-949,-499]mm · 14 of 100 slices shown, 19 images]
[im 5/100  soft-tissue]
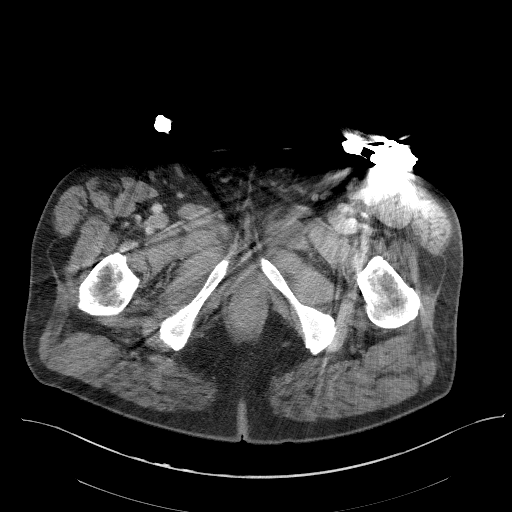
[im 5/100  bone]
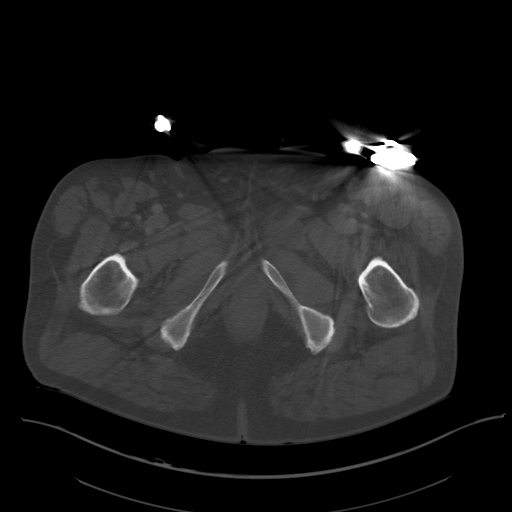
[im 15/100  soft-tissue]
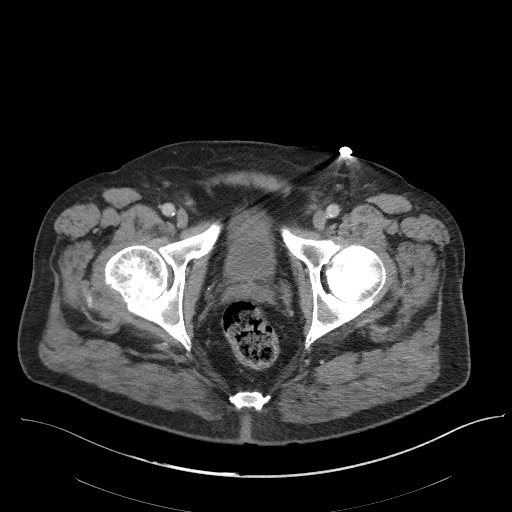
[im 20/100  soft-tissue]
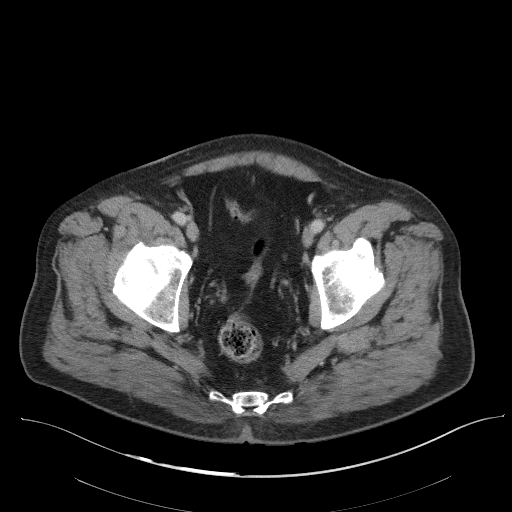
[im 30/100  soft-tissue]
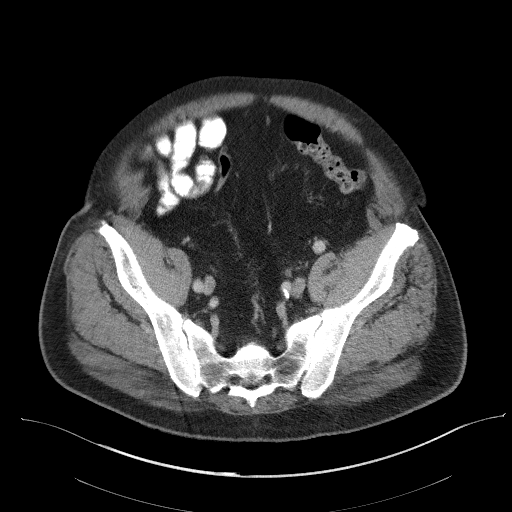
[im 35/100  soft-tissue]
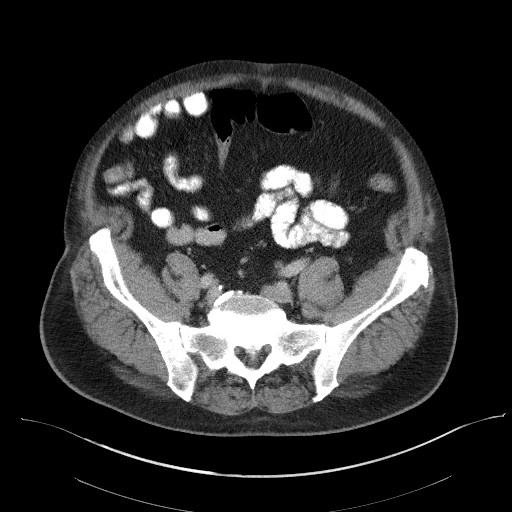
[im 45/100  soft-tissue]
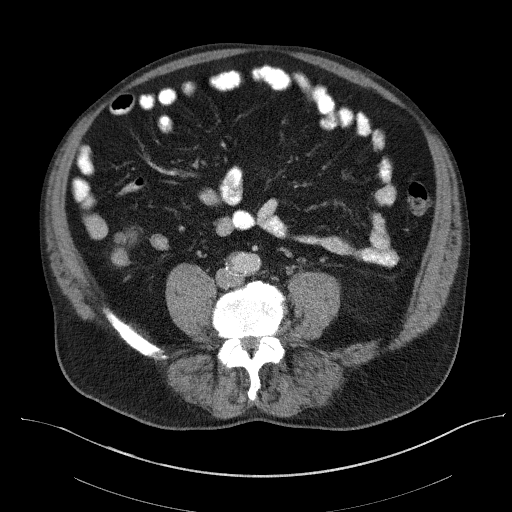
[im 50/100  soft-tissue]
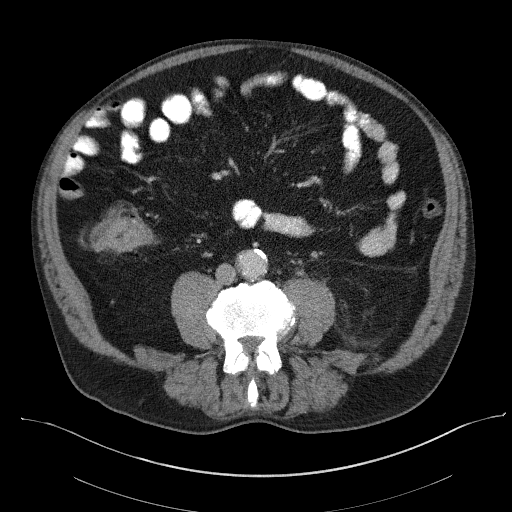
[im 55/100  soft-tissue]
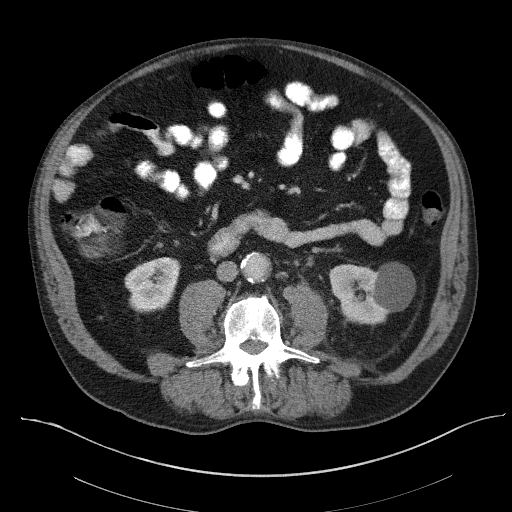
[im 65/100  soft-tissue]
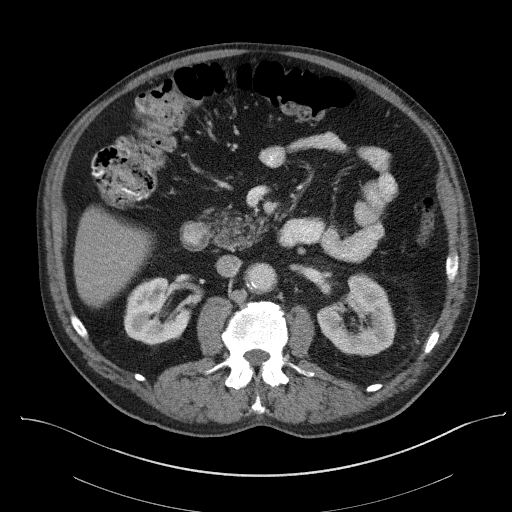
[im 65/100  bone]
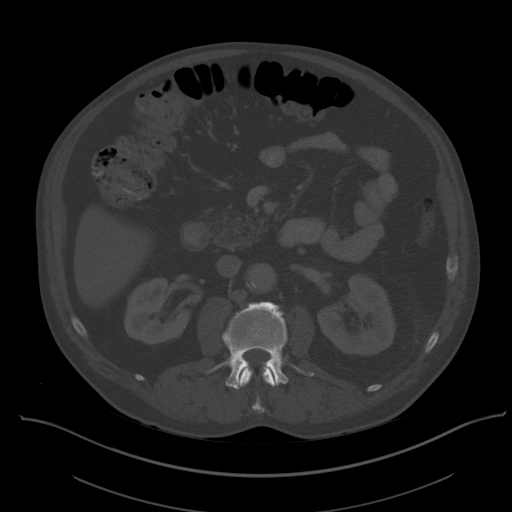
[im 70/100  soft-tissue]
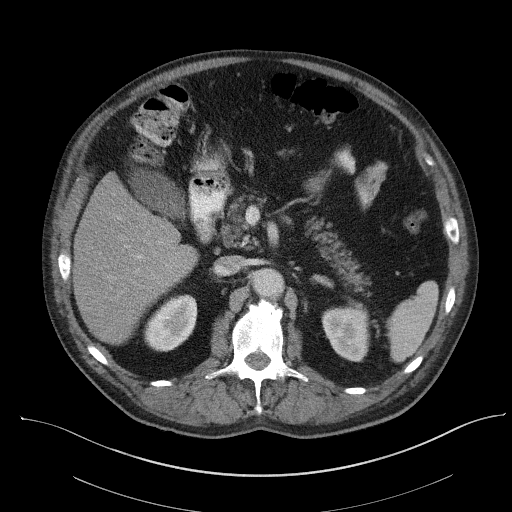
[im 80/100  soft-tissue]
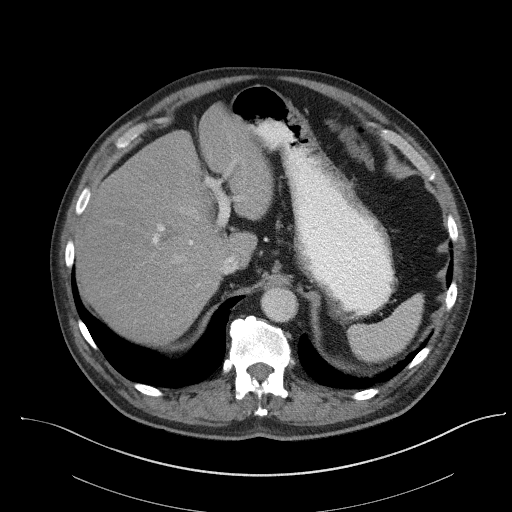
[im 80/100  lung]
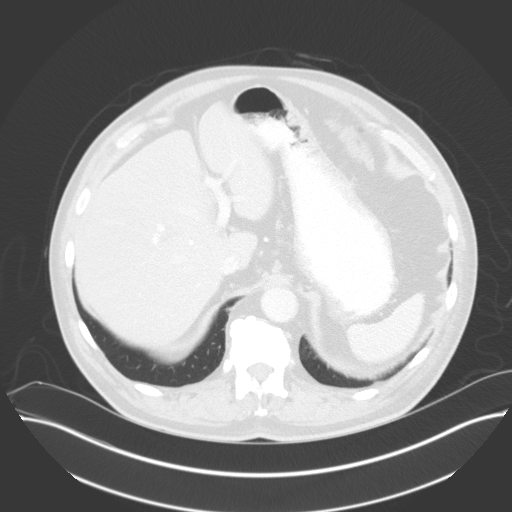
[im 85/100  soft-tissue]
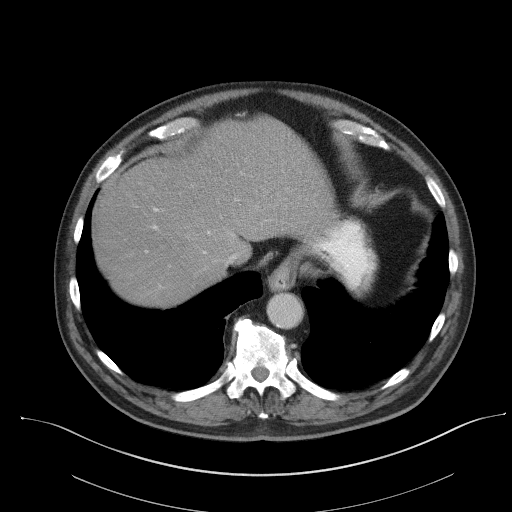
[im 85/100  lung]
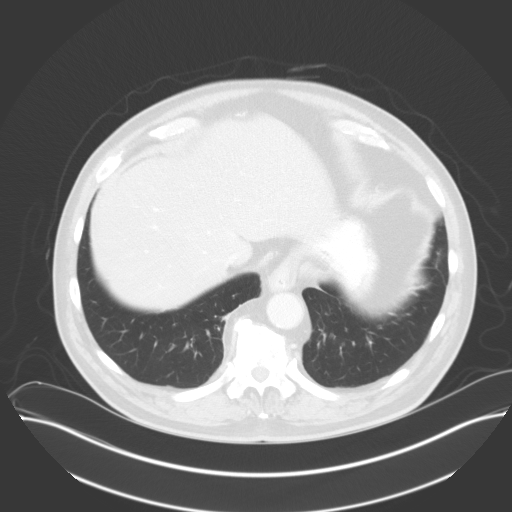
[im 90/100  lung]
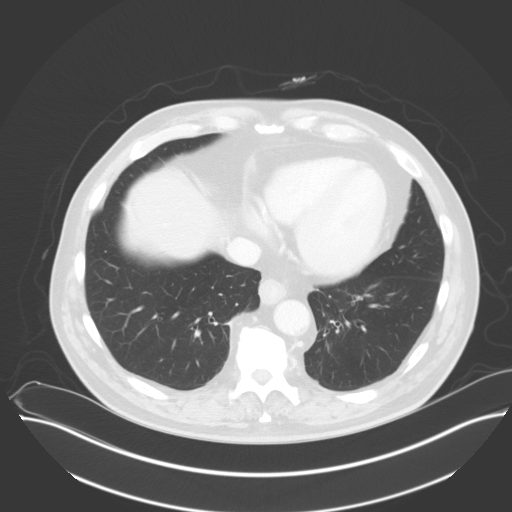
[im 95/100  soft-tissue]
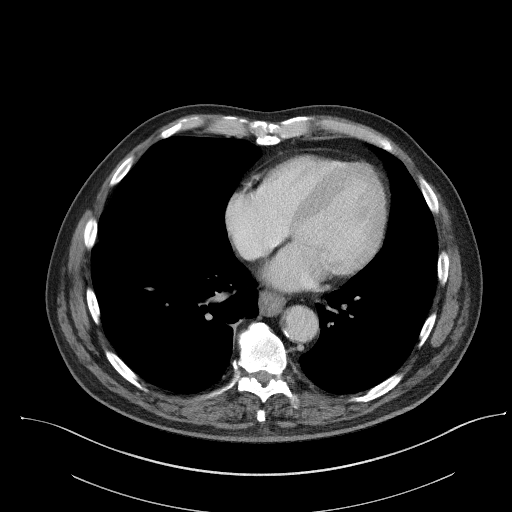
[im 95/100  lung]
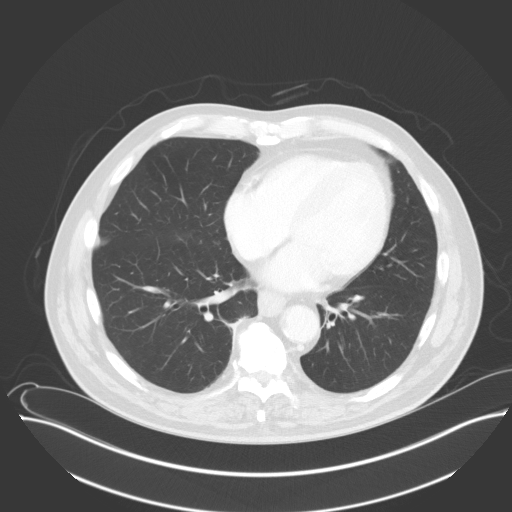

[14 of 32 positions shown; findings below may reference images not displayed]

FINDINGS: Lower chest: No acute abnormality.

Hepatobiliary: No focal liver abnormality is seen. No gallstones,
gallbladder wall thickening, or biliary dilatation.

Pancreas: Unremarkable. No pancreatic ductal dilatation or
surrounding inflammatory changes.

Spleen: Normal in size without focal abnormality.

Adrenals/Urinary Tract: Normal adrenal glands. Normal appearance of
the right kidney, right ureter and urinary bladder. 2.3 mm distal
left ureteral calculus, just proximal to the vesicoureteral junction
causes upstream moderate left hydroureter, trace left hydronephrosis
and periureteral and perinephric stranding. Benign-appearing 4.3 cm
cyst in the lower pole of the left kidney.

Stomach/Bowel: Stomach is within normal limits. Appendix appears
normal. No evidence of bowel wall thickening, distention, or
inflammatory changes.

Vascular/Lymphatic: Aortic atherosclerosis. No enlarged abdominal or
pelvic lymph nodes.

Reproductive: Prostate is unremarkable.

Other: No abdominal wall hernia or abnormality. No abdominopelvic
ascites.

Musculoskeletal: Spondylosis of the lumbosacral spine.
IMPRESSION: Left obstructive uropathy caused by 2.3 mm left distal ureteral
calculus, just upstream of the vesicoureteral junction.

## 2018-04-16 MED ORDER — TAMSULOSIN HCL 0.4 MG PO CAPS: 0.4000 mg | ORAL_CAPSULE | Freq: Every day | ORAL | 0 refills | Status: DC

## 2018-04-16 MED ORDER — HYDROCODONE-ACETAMINOPHEN 5-325 MG PO TABS: 1.0000 | ORAL_TABLET | Freq: Four times a day (QID) | ORAL | 0 refills | Status: DC | PRN

## 2018-04-16 MED ORDER — ONDANSETRON 4 MG PO TBDP: 4.0000 mg | ORAL_TABLET | Freq: Three times a day (TID) | ORAL | 0 refills | Status: DC | PRN

## 2018-04-16 MED ORDER — IOHEXOL 300 MG/ML  SOLN: 30.0000 mL | Freq: Once | INTRAMUSCULAR | Status: DC | PRN

## 2018-04-16 MED ORDER — SULFAMETHOXAZOLE-TRIMETHOPRIM 800-160 MG PO TABS: 1.0000 | ORAL_TABLET | Freq: Two times a day (BID) | ORAL | 0 refills | Status: AC

## 2018-04-16 MED ORDER — IOPAMIDOL (ISOVUE-300) INJECTION 61%
80.0000 mL | Freq: Once | INTRAVENOUS | Status: AC | PRN
  Administered 2018-04-16: 80 mL via INTRAVENOUS

## 2018-04-16 NOTE — Discharge Instructions (Addendum)
Lots of fluids.  See your primary for re-evaluation on Monday (with repeat labs).  If you worsen, go to the ER.  Take care  Dr. Lacinda Axon

## 2018-04-16 NOTE — ED Triage Notes (Signed)
Patient c/o right abdominal pain x last night. Per patient vomit 4 times this morning around 5:30 am and again 3 times around 11 am today. Per patient felt like a knot at his left side.

## 2018-04-16 NOTE — ED Provider Notes (Signed)
[facility]    CSN: [id number] Arrival date & time: 04/16/18  1219  History   Chief Complaint Chief Complaint  Patient presents with  . Abdominal Pain   HPI  77 year old male presents with left lower quadrant pain.  Patient reports that he developed severe left lower quadrant pain early this morning around 1 AM.  Patient states that his pain was severe.  Improved after vomiting 4 times around 430.  He states that he is a little bit of food and has subsequently vomited 3 additional times around 11:00.  He continues to have ongoing pain which is severe.  No known inciting factor.  No known exacerbating factors.  He has had no bowel movement.  No reports of diarrhea or constipation.  No hematochezia or melena.  No hematemesis.  No other reported symptoms.  No other complaints or concerns at this time.  Past Medical History:  Diagnosis Date  . Arthritis   . Diabetes mellitus without complication ([place])   . Hyperlipemia   . Hypertension    Past Surgical History:  Procedure Laterality Date  . JOINT REPLACEMENT     Home Medications    Prior to Admission medications   Medication Sig Start Date End Date Taking? Authorizing Provider  amLODipine (NORVASC) 5 MG tablet Take 5 mg by mouth daily.   Yes [provider]  fluticasone (FLONASE) 50 MCG/ACT nasal spray Place into the nose.   Yes [provider]  meloxicam (MOBIC) 15 MG tablet Take 15 mg by mouth daily.   Yes [provider]  metFORMIN (GLUCOPHAGE) 500 MG tablet Take by mouth. 01/12/18 01/12/19 Yes [provider]  pravastatin (PRAVACHOL) 40 MG tablet Take 40 mg by mouth daily.   Yes [provider]  HYDROcodone-acetaminophen (NORCO/VICODIN) 5-325 MG tablet Take 1 tablet by mouth every 6 (six) hours as needed. 04/16/18   Coral Spikes, DO  ondansetron (ZOFRAN-ODT) 4 MG disintegrating tablet Take 1 tablet (4 mg total) by mouth every 8 (eight) hours as needed for nausea or vomiting.  04/16/18   Coral Spikes, DO  sulfamethoxazole-trimethoprim (BACTRIM DS,SEPTRA DS) 800-160 MG tablet Take 1 tablet by mouth 2 (two) times daily for 7 days. 04/16/18 04/23/18  Coral Spikes, DO  tamsulosin (FLOMAX) 0.4 MG CAPS capsule Take 1 capsule (0.4 mg total) by mouth daily. 04/16/18   Coral Spikes, DO   Social History Social History   Tobacco Use  . Smoking status: Current Every Day Smoker    Packs/day: 1.00    Types: Cigarettes  . Smokeless tobacco: Never Used  Substance Use Topics  . Alcohol use: No  . Drug use: No    Allergies   Patient has no known allergies.   Review of Systems Review of Systems  Constitutional: Negative for fever.  Gastrointestinal: Positive for abdominal pain, nausea and vomiting. Negative for constipation and diarrhea.   Physical Exam Triage Vital Signs ED Triage Vitals [04/16/18 1247]  Enc Vitals Group     BP (!) 129/107     Pulse Rate 92     Resp 16     Temp 98.4 F (36.9 C)     Temp Source Oral     SpO2 99 %     Weight 225 lb (102.1 kg)     Height 6' (1.829 m)     Head Circumference      Peak Flow      Pain Score 4     Pain Loc  Pain Edu?      Excl. in [place]?    Updated Vital Signs BP (!) 129/107 (BP Location: Left Arm)   Pulse 92   Temp 98.4 F (36.9 C) (Oral)   Resp 16   Ht 6' (1.829 m)   Wt 225 lb (102.1 kg)   SpO2 99%   BMI 30.52 kg/m   Physical Exam  Constitutional: He is oriented to person, place, and time. He appears well-developed. No distress.  HENT:  Head: Normocephalic and atraumatic.  Mouth/Throat: Oropharynx is clear and moist.  Cardiovascular: Normal rate and regular rhythm.  Pulmonary/Chest: Effort normal and breath sounds normal. He has no wheezes. He has no rales.  Abdominal: Soft.  Nondistended.  Tender to palpation in the left lower quadrant.  No rebound or guarding. Left CVA tenderness.  Neurological: He is alert and oriented to person, place, and time.  Psychiatric: He has a normal mood and affect.  His behavior is normal.  Nursing note and vitals reviewed.  UC Treatments / Results  Labs (all labs ordered are listed, but only abnormal results are displayed) Labs Reviewed  CBC WITH DIFFERENTIAL/PLATELET - Abnormal; Notable for the following components:      Result Value   WBC 11.5 (*)    Neutro Abs 9.0 (*)    All other components within normal limits  COMPREHENSIVE METABOLIC PANEL - Abnormal; Notable for the following components:   Glucose, Bld 190 (*)    Creatinine, Ser 1.31 (*)    GFR calc non Af Amer 52 (*)    All other components within normal limits    EKG None  Radiology Ct Abdomen Pelvis W Contrast  Result Date: 04/16/2018 CLINICAL DATA:  Left-sided abdominal pain, nausea vomiting. EXAM: CT ABDOMEN AND PELVIS WITH CONTRAST TECHNIQUE: Multidetector CT imaging of the abdomen and pelvis was performed using the standard protocol following bolus administration of intravenous contrast. CONTRAST:  85mL ISOVUE-300 IOPAMIDOL (ISOVUE-300) INJECTION 61% COMPARISON:  Body CT 12/09/2008 FINDINGS: Lower chest: No acute abnormality. Hepatobiliary: No focal liver abnormality is seen. No gallstones, gallbladder wall thickening, or biliary dilatation. Pancreas: Unremarkable. No pancreatic ductal dilatation or surrounding inflammatory changes. Spleen: Normal in size without focal abnormality. Adrenals/Urinary Tract: Normal adrenal glands. Normal appearance of the right kidney, right ureter and urinary bladder. 2.3 mm distal left ureteral calculus, just proximal to the vesicoureteral junction causes upstream moderate left hydroureter, trace left hydronephrosis and periureteral and perinephric stranding. Benign-appearing 4.3 cm cyst in the lower pole of the left kidney. Stomach/Bowel: Stomach is within normal limits. Appendix appears normal. No evidence of bowel wall thickening, distention, or inflammatory changes. Vascular/Lymphatic: Aortic atherosclerosis. No enlarged abdominal or pelvic lymph  nodes. Reproductive: Prostate is unremarkable. Other: No abdominal wall hernia or abnormality. No abdominopelvic ascites. Musculoskeletal: Spondylosis of the lumbosacral spine. IMPRESSION: Left obstructive uropathy caused by 2.3 mm left distal ureteral calculus, just upstream of the vesicoureteral junction. Electronically Signed   By: Fidela Salisbury M.D.   On: 04/16/2018 15:38    Procedures Procedures (including critical care time)  Medications Ordered in UC Medications  iohexol (OMNIPAQUE) 300 MG/ML solution 30 mL (has no administration in time range)  iopamidol (ISOVUE-300) 61 % injection 80 mL (80 mLs Intravenous Contrast Given 04/16/18 1503)    Initial Impression / Assessment and Plan / UC Course  I have reviewed the triage vital signs and the nursing notes.  Pertinent labs & imaging results that were available during my care of the patient were reviewed by me and considered  in my medical decision making (see chart for details).    76 year old male presents with left lower quadrant pain.  Also with CVA tenderness.  Surprisingly, patient found to have stone on CT scan.  CT revealed  2.3 mm distal left ureteral calculus, just proximal to the vesicoureteral junction causes upstream moderate left hydroureter, trace left hydronephrosis and periureteral and perinephric stranding.  I discussed this with Dr. Gloriann Loan the on-call urologist.  Safe to discharge home.  Given stranding, I am placing him on Bactrim.  Treating pain with Vicodin.  Flomax to help pass stone.  Zofran for nausea.  Advised him to follow-up with his primary care physician early next week as he needs repeat laboratory studies and reevaluation.  If he fails to improve or worsens, he is to go to the ER.  Patient is in agreement with the plan.  Final Clinical Impressions(s) / UC Diagnoses   Final diagnoses:  Left ureteral stone     Discharge Instructions     Lots of fluids.  See your primary for re-evaluation on Monday  (with repeat labs).  If you worsen, go to the ER.  Take care  Dr. Lacinda Axon     ED Prescriptions    Medication Sig Dispense Auth. Provider   tamsulosin (FLOMAX) 0.4 MG CAPS capsule Take 1 capsule (0.4 mg total) by mouth daily. 30 capsule Blaze Nylund G, DO   HYDROcodone-acetaminophen (NORCO/VICODIN) 5-325 MG tablet Take 1 tablet by mouth every 6 (six) hours as needed. 20 tablet Nickisha Hum G, DO   sulfamethoxazole-trimethoprim (BACTRIM DS,SEPTRA DS) 800-160 MG tablet Take 1 tablet by mouth 2 (two) times daily for 7 days. 14 tablet Azazel Franze G, DO   ondansetron (ZOFRAN-ODT) 4 MG disintegrating tablet Take 1 tablet (4 mg total) by mouth every 8 (eight) hours as needed for nausea or vomiting. 20 tablet Coral Spikes, DO     Controlled Substance Prescriptions [place] Controlled Substance Registry consulted? Yes, I have consulted the [place] Controlled Substances Registry for this patient, and feel the risk/benefit ratio today is favorable for proceeding with this prescription for a controlled substance.   Coral Spikes, [place] 04/16/18 1606

## 2018-04-25 DIAGNOSIS — G8929 Other chronic pain: Secondary | ICD-10-CM | POA: Diagnosis not present

## 2018-04-25 DIAGNOSIS — Z0000 Encounter for general adult medical examination without abnormal findings: Secondary | ICD-10-CM | POA: Diagnosis not present

## 2018-04-25 DIAGNOSIS — E7800 Pure hypercholesterolemia, unspecified: Secondary | ICD-10-CM | POA: Diagnosis not present

## 2018-04-25 DIAGNOSIS — M546 Pain in thoracic spine: Secondary | ICD-10-CM | POA: Diagnosis not present

## 2018-04-25 DIAGNOSIS — I10 Essential (primary) hypertension: Secondary | ICD-10-CM | POA: Diagnosis not present

## 2018-04-25 DIAGNOSIS — E1165 Type 2 diabetes mellitus with hyperglycemia: Secondary | ICD-10-CM | POA: Diagnosis not present

## 2018-05-08 ENCOUNTER — Other Ambulatory Visit: Payer: Self-pay | Admitting: Family Medicine

## 2018-08-03 DIAGNOSIS — G8929 Other chronic pain: Secondary | ICD-10-CM | POA: Diagnosis not present

## 2018-08-03 DIAGNOSIS — E1165 Type 2 diabetes mellitus with hyperglycemia: Secondary | ICD-10-CM | POA: Diagnosis not present

## 2018-08-03 DIAGNOSIS — M546 Pain in thoracic spine: Secondary | ICD-10-CM | POA: Diagnosis not present

## 2018-08-03 DIAGNOSIS — I10 Essential (primary) hypertension: Secondary | ICD-10-CM | POA: Diagnosis not present

## 2018-08-03 DIAGNOSIS — Z23 Encounter for immunization: Secondary | ICD-10-CM | POA: Diagnosis not present

## 2018-08-03 DIAGNOSIS — E7800 Pure hypercholesterolemia, unspecified: Secondary | ICD-10-CM | POA: Diagnosis not present

## 2018-11-03 DIAGNOSIS — I10 Essential (primary) hypertension: Secondary | ICD-10-CM | POA: Diagnosis not present

## 2018-11-03 DIAGNOSIS — N200 Calculus of kidney: Secondary | ICD-10-CM | POA: Diagnosis not present

## 2018-11-03 DIAGNOSIS — E7800 Pure hypercholesterolemia, unspecified: Secondary | ICD-10-CM | POA: Diagnosis not present

## 2018-11-03 DIAGNOSIS — E119 Type 2 diabetes mellitus without complications: Secondary | ICD-10-CM | POA: Diagnosis not present

## 2018-11-03 DIAGNOSIS — M546 Pain in thoracic spine: Secondary | ICD-10-CM | POA: Diagnosis not present

## 2018-11-03 DIAGNOSIS — G8929 Other chronic pain: Secondary | ICD-10-CM | POA: Diagnosis not present

## 2018-11-03 DIAGNOSIS — R82998 Other abnormal findings in urine: Secondary | ICD-10-CM | POA: Diagnosis not present

## 2018-11-11 DIAGNOSIS — R82998 Other abnormal findings in urine: Secondary | ICD-10-CM | POA: Diagnosis not present

## 2018-11-15 ENCOUNTER — Other Ambulatory Visit (HOSPITAL_COMMUNITY): Payer: Self-pay | Admitting: Family Medicine

## 2018-11-15 ENCOUNTER — Other Ambulatory Visit: Payer: Self-pay | Admitting: Family Medicine

## 2018-11-15 DIAGNOSIS — N200 Calculus of kidney: Secondary | ICD-10-CM

## 2018-11-23 ENCOUNTER — Encounter (INDEPENDENT_AMBULATORY_CARE_PROVIDER_SITE_OTHER): Payer: Self-pay

## 2018-11-23 ENCOUNTER — Ambulatory Visit: Payer: Medicare HMO

## 2019-02-08 DIAGNOSIS — G8929 Other chronic pain: Secondary | ICD-10-CM | POA: Diagnosis not present

## 2019-02-08 DIAGNOSIS — E119 Type 2 diabetes mellitus without complications: Secondary | ICD-10-CM | POA: Diagnosis not present

## 2019-02-08 DIAGNOSIS — N200 Calculus of kidney: Secondary | ICD-10-CM | POA: Diagnosis not present

## 2019-02-08 DIAGNOSIS — E7800 Pure hypercholesterolemia, unspecified: Secondary | ICD-10-CM | POA: Diagnosis not present

## 2019-02-08 DIAGNOSIS — I10 Essential (primary) hypertension: Secondary | ICD-10-CM | POA: Diagnosis not present

## 2019-02-08 DIAGNOSIS — M546 Pain in thoracic spine: Secondary | ICD-10-CM | POA: Diagnosis not present

## 2019-03-04 ENCOUNTER — Ambulatory Visit (INDEPENDENT_AMBULATORY_CARE_PROVIDER_SITE_OTHER)
Admission: EM | Admit: 2019-03-04 | Discharge: 2019-03-04 | Disposition: A | Discharge status: Other Acute Inpatient Hospital | Payer: Medicare HMO | Source: Home / Self Care | Attending: Family Medicine | Admitting: Family Medicine

## 2019-03-04 ENCOUNTER — Encounter: Payer: Self-pay | Admitting: Emergency Medicine

## 2019-03-04 ENCOUNTER — Ambulatory Visit (INDEPENDENT_AMBULATORY_CARE_PROVIDER_SITE_OTHER): Payer: Medicare HMO

## 2019-03-04 ENCOUNTER — Other Ambulatory Visit: Payer: Self-pay

## 2019-03-04 ENCOUNTER — Inpatient Hospital Stay
Admission: EM | Admit: 2019-03-04 | Discharge: 2019-03-08 | DRG: 372 | Disposition: A | Discharge status: Other Acute Inpatient Hospital | Payer: Medicare HMO | Attending: General Surgery | Admitting: General Surgery

## 2019-03-04 DIAGNOSIS — K37 Unspecified appendicitis: Secondary | ICD-10-CM | POA: Diagnosis not present

## 2019-03-04 DIAGNOSIS — R160 Hepatomegaly, not elsewhere classified: Secondary | ICD-10-CM | POA: Diagnosis not present

## 2019-03-04 DIAGNOSIS — K3580 Unspecified acute appendicitis: Secondary | ICD-10-CM | POA: Diagnosis not present

## 2019-03-04 DIAGNOSIS — R509 Fever, unspecified: Secondary | ICD-10-CM

## 2019-03-04 DIAGNOSIS — R05 Cough: Secondary | ICD-10-CM

## 2019-03-04 DIAGNOSIS — I10 Essential (primary) hypertension: Secondary | ICD-10-CM | POA: Diagnosis not present

## 2019-03-04 DIAGNOSIS — Z87442 Personal history of urinary calculi: Secondary | ICD-10-CM | POA: Diagnosis not present

## 2019-03-04 DIAGNOSIS — M1990 Unspecified osteoarthritis, unspecified site: Secondary | ICD-10-CM | POA: Diagnosis present

## 2019-03-04 DIAGNOSIS — Z888 Allergy status to other drugs, medicaments and biological substances status: Secondary | ICD-10-CM

## 2019-03-04 DIAGNOSIS — Z7951 Long term (current) use of inhaled steroids: Secondary | ICD-10-CM | POA: Diagnosis not present

## 2019-03-04 DIAGNOSIS — E785 Hyperlipidemia, unspecified: Secondary | ICD-10-CM | POA: Diagnosis not present

## 2019-03-04 DIAGNOSIS — R109 Unspecified abdominal pain: Secondary | ICD-10-CM | POA: Diagnosis not present

## 2019-03-04 DIAGNOSIS — C19 Malignant neoplasm of rectosigmoid junction: Secondary | ICD-10-CM | POA: Diagnosis present

## 2019-03-04 DIAGNOSIS — K3589 Other acute appendicitis without perforation or gangrene: Secondary | ICD-10-CM | POA: Diagnosis present

## 2019-03-04 DIAGNOSIS — I2510 Atherosclerotic heart disease of native coronary artery without angina pectoris: Secondary | ICD-10-CM | POA: Diagnosis not present

## 2019-03-04 DIAGNOSIS — E871 Hypo-osmolality and hyponatremia: Secondary | ICD-10-CM | POA: Diagnosis not present

## 2019-03-04 DIAGNOSIS — Z1159 Encounter for screening for other viral diseases: Secondary | ICD-10-CM

## 2019-03-04 DIAGNOSIS — Z03818 Encounter for observation for suspected exposure to other biological agents ruled out: Secondary | ICD-10-CM | POA: Diagnosis not present

## 2019-03-04 DIAGNOSIS — R059 Cough, unspecified: Secondary | ICD-10-CM

## 2019-03-04 DIAGNOSIS — C787 Secondary malignant neoplasm of liver and intrahepatic bile duct: Secondary | ICD-10-CM

## 2019-03-04 DIAGNOSIS — N200 Calculus of kidney: Secondary | ICD-10-CM | POA: Diagnosis not present

## 2019-03-04 DIAGNOSIS — Z7984 Long term (current) use of oral hypoglycemic drugs: Secondary | ICD-10-CM | POA: Diagnosis not present

## 2019-03-04 DIAGNOSIS — Z79899 Other long term (current) drug therapy: Secondary | ICD-10-CM

## 2019-03-04 DIAGNOSIS — F17210 Nicotine dependence, cigarettes, uncomplicated: Secondary | ICD-10-CM | POA: Diagnosis present

## 2019-03-04 DIAGNOSIS — E119 Type 2 diabetes mellitus without complications: Secondary | ICD-10-CM | POA: Diagnosis not present

## 2019-03-04 DIAGNOSIS — K6389 Other specified diseases of intestine: Secondary | ICD-10-CM

## 2019-03-04 DIAGNOSIS — K3533 Acute appendicitis with perforation and localized peritonitis, with abscess: Secondary | ICD-10-CM | POA: Diagnosis not present

## 2019-03-04 DIAGNOSIS — R1030 Lower abdominal pain, unspecified: Secondary | ICD-10-CM | POA: Diagnosis not present

## 2019-03-04 LAB — GLUCOSE, CAPILLARY: Glucose-Capillary: 117 mg/dL — ABNORMAL HIGH (ref 70–99)

## 2019-03-04 LAB — COMPREHENSIVE METABOLIC PANEL
ALT: 21 U/L (ref 0–44)
AST: 22 U/L (ref 15–41)
Albumin: 3.2 g/dL — ABNORMAL LOW (ref 3.5–5.0)
Alkaline Phosphatase: 91 U/L (ref 38–126)
Anion gap: 10 (ref 5–15)
BUN: 18 mg/dL (ref 8–23)
CO2: 20 mmol/L — ABNORMAL LOW (ref 22–32)
Calcium: 8.4 mg/dL — ABNORMAL LOW (ref 8.9–10.3)
Chloride: 103 mmol/L (ref 98–111)
Creatinine, Ser: 0.92 mg/dL (ref 0.61–1.24)
GFR calc Af Amer: >60 mL/min (ref >60)
GFR calc non Af Amer: >60 mL/min (ref >60)
Glucose, Bld: 242 mg/dL — ABNORMAL HIGH (ref 70–99)
Potassium: 4.1 mmol/L (ref 3.5–5.1)
Sodium: 133 mmol/L — ABNORMAL LOW (ref 135–145)
Total Bilirubin: 0.9 mg/dL (ref 0.3–1.2)
Total Protein: 7 g/dL (ref 6.5–8.1)

## 2019-03-04 LAB — URINALYSIS, COMPLETE (UACMP) WITH MICROSCOPIC: Bacteria, UA: NONE SEEN

## 2019-03-04 LAB — CBC WITH DIFFERENTIAL/PLATELET
Abs Immature Granulocytes: 0.05 10*3/uL (ref 0.00–0.07)
Basophils Absolute: 0 10*3/uL (ref 0.0–0.1)
Basophils Relative: 0 %
Eosinophils Absolute: 0.1 10*3/uL (ref 0.0–0.5)
Eosinophils Relative: 1 %
HCT: 36.7 % — ABNORMAL LOW (ref 39.0–52.0)
Hemoglobin: 12.4 g/dL — ABNORMAL LOW (ref 13.0–17.0)
Immature Granulocytes: 0 %
Lymphocytes Relative: 8 %
Lymphs Abs: 1 10*3/uL (ref 0.7–4.0)
MCH: 31.2 pg (ref 26.0–34.0)
MCHC: 33.8 g/dL (ref 30.0–36.0)
MCV: 92.4 fL (ref 80.0–100.0)
Monocytes Absolute: 1 10*3/uL (ref 0.1–1.0)
Monocytes Relative: 8 %
Neutro Abs: 10.8 10*3/uL — ABNORMAL HIGH (ref 1.7–7.7)
Neutrophils Relative %: 83 %
Platelets: 332 10*3/uL (ref 150–400)
RBC: 3.97 MIL/uL — ABNORMAL LOW (ref 4.22–5.81)
RDW: 12.4 % (ref 11.5–15.5)
WBC: 13 10*3/uL — ABNORMAL HIGH (ref 4.0–10.5)
nRBC: 0 % (ref 0.0–0.2)

## 2019-03-04 LAB — SARS CORONAVIRUS 2 BY RT PCR (HOSPITAL ORDER, PERFORMED IN ~~LOC~~ HOSPITAL LAB): SARS Coronavirus 2: NEGATIVE

## 2019-03-04 LAB — LIPASE, BLOOD: Lipase: 24 U/L (ref 11–51)

## 2019-03-04 IMAGING — CR CHEST - 2 VIEW
3 series · 3 of 3 positions shown · non-contrast
Comparison: [DATE]

CLINICAL DATA: Cough, fever

EXAM:
CHEST - 2 VIEW

[chest pa (1 of 2)]
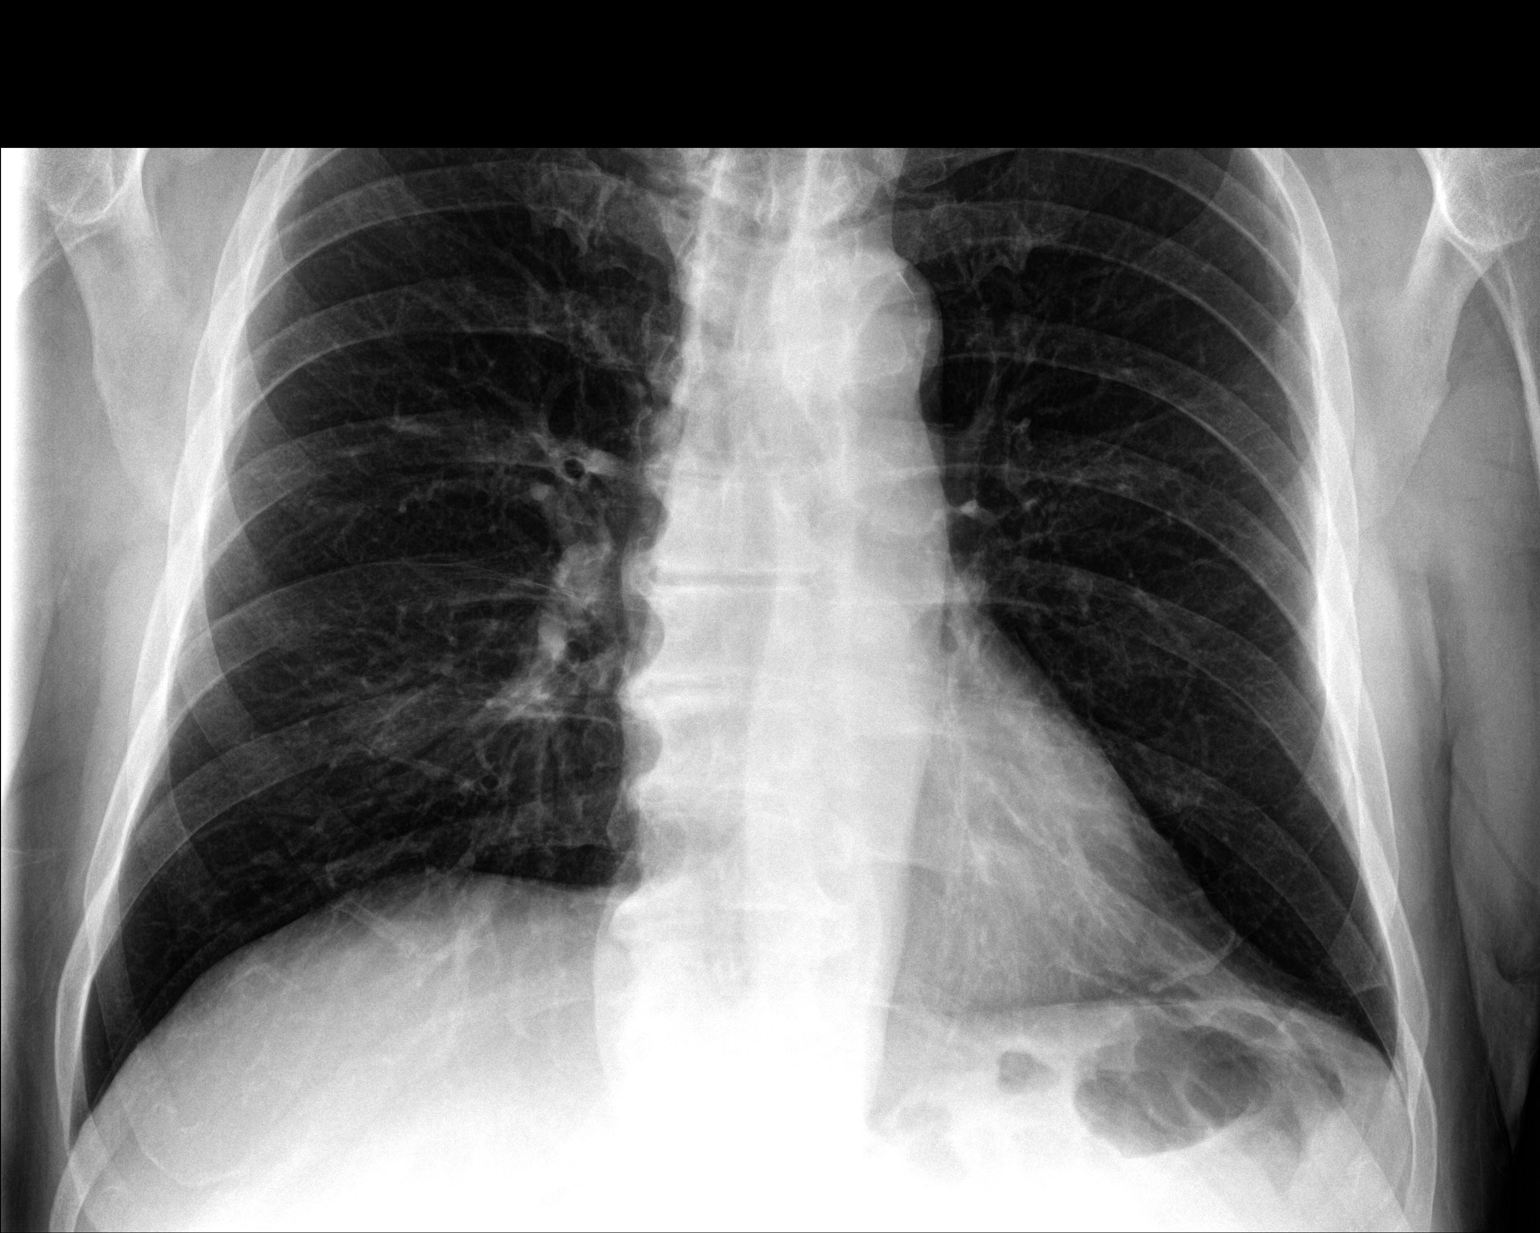

[chest lat]
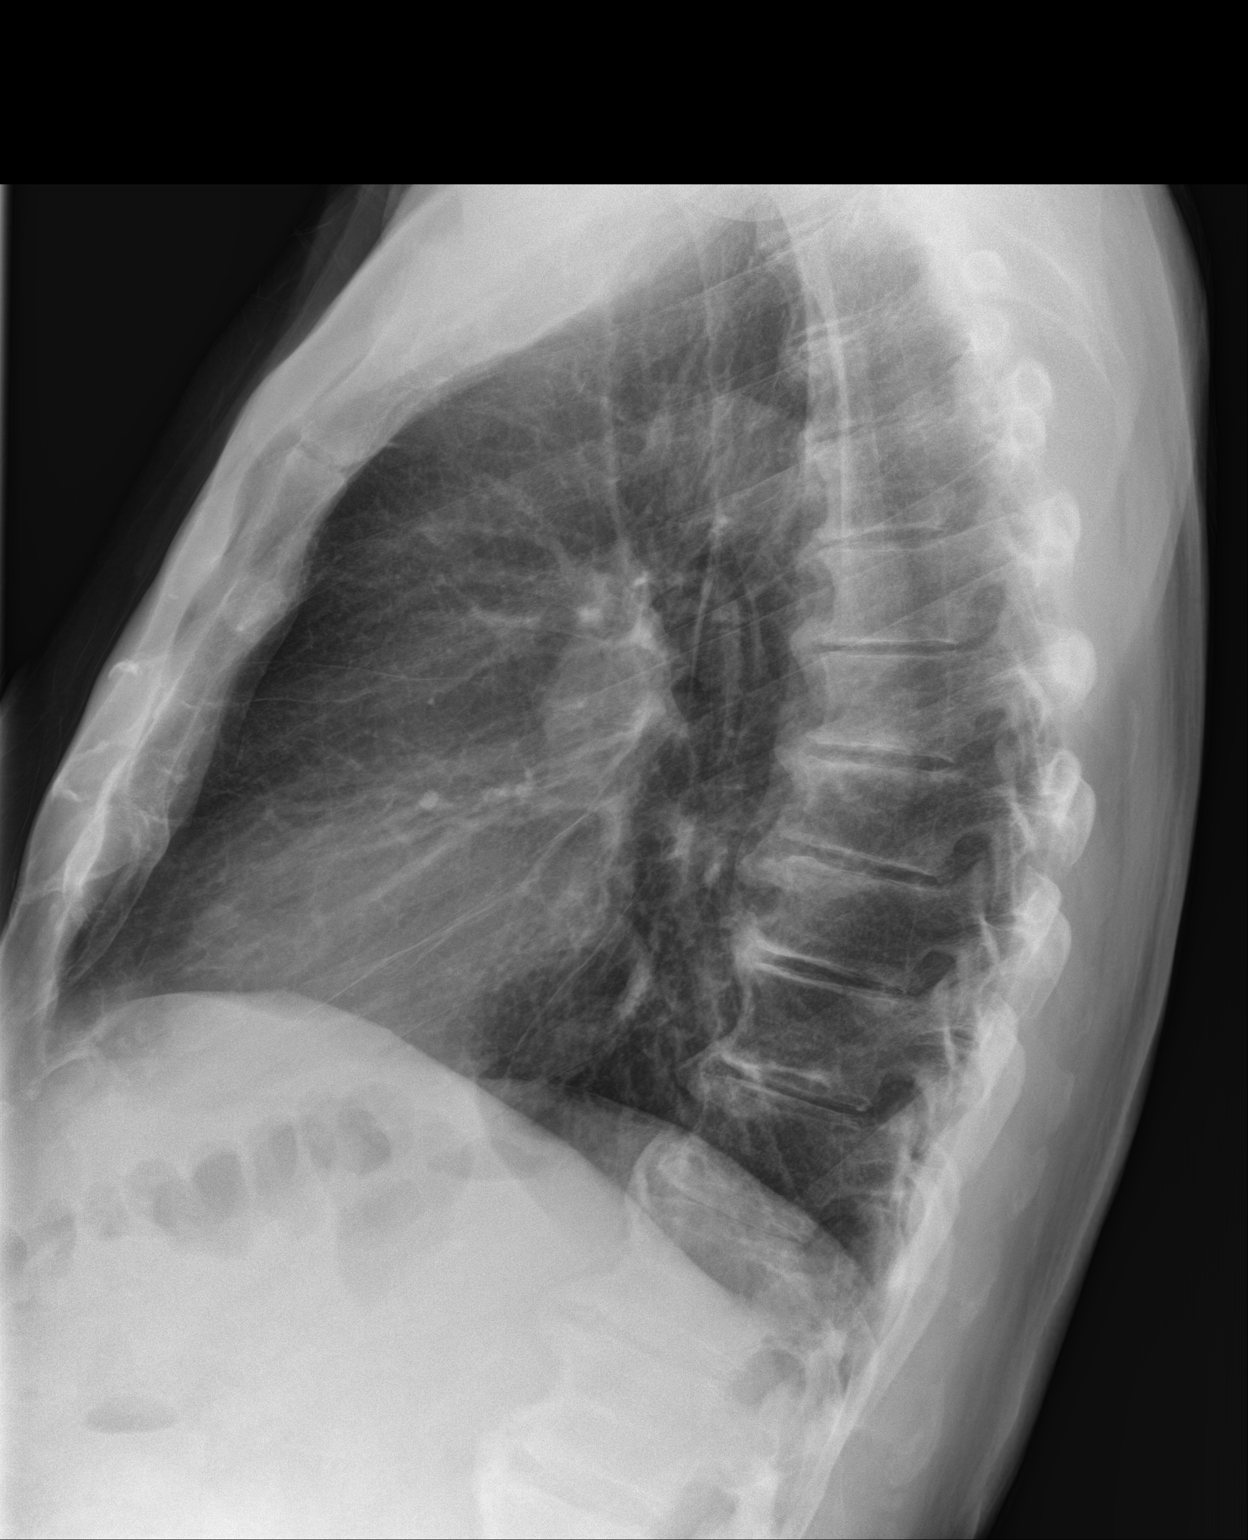

[chest pa (2 of 2)]
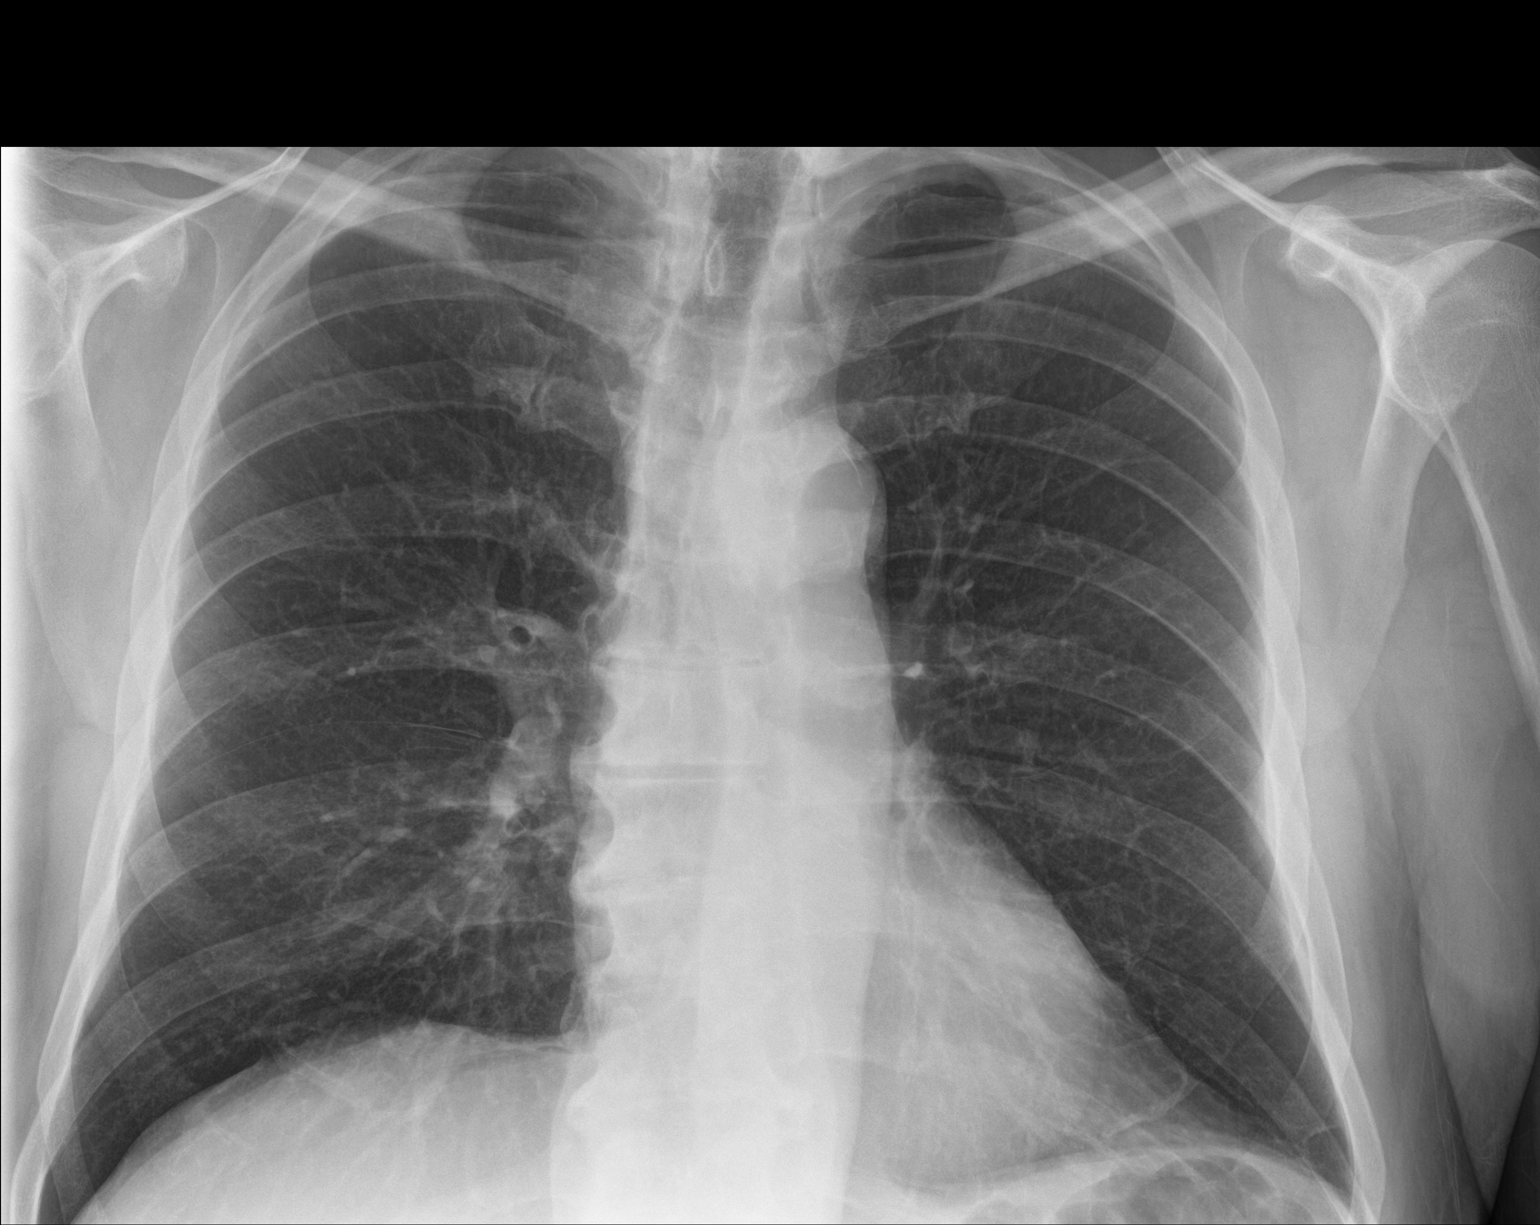

[3 of 3 positions shown; findings below may reference images not displayed]

FINDINGS: The heart size and mediastinal contours are within normal limits.
Both lungs are clear. Disc degenerative disease of the thoracic
spine.
IMPRESSION: No acute abnormality of the lungs.

## 2019-03-04 IMAGING — CT CT RENAL STONE PROTOCOL
1 of 2 series · 14 of 32 positions shown, 18 images · non-contrast
Comparison: [DATE]

CLINICAL DATA: Right flank pain

EXAM:
CT ABDOMEN AND PELVIS WITHOUT CONTRAST
TECHNIQUE: Multidetector CT imaging of the abdomen and pelvis was performed
following the standard protocol without IV contrast.

[Series 2: axial st · axial · 0.79mm/px · z∈[-963,-543]mm · 14 of 96 slices shown, 18 images]
[im 8/96  soft-tissue]
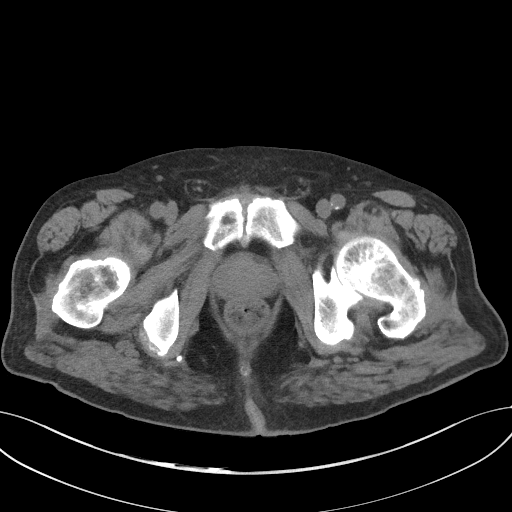
[im 8/96  bone]
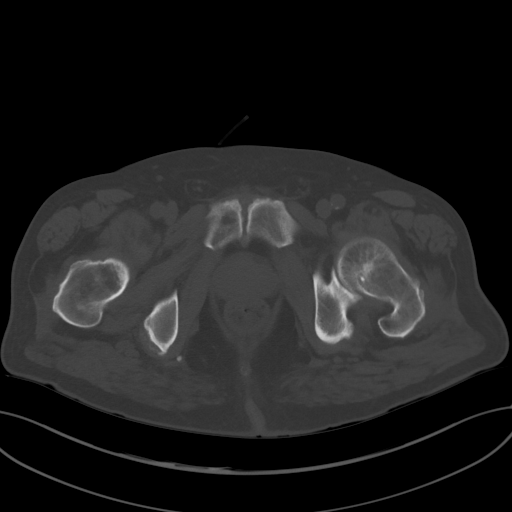
[im 16/96  soft-tissue]
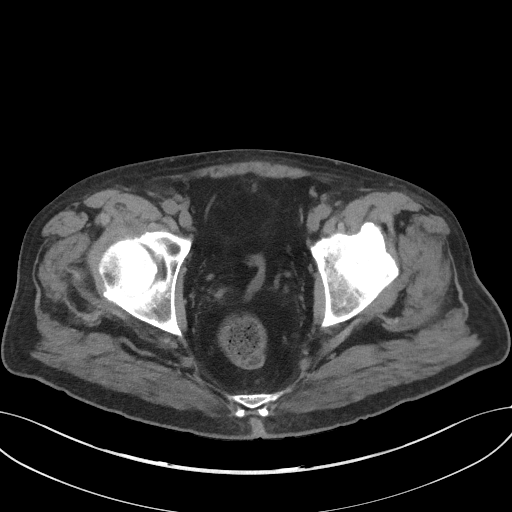
[im 23/96  soft-tissue]
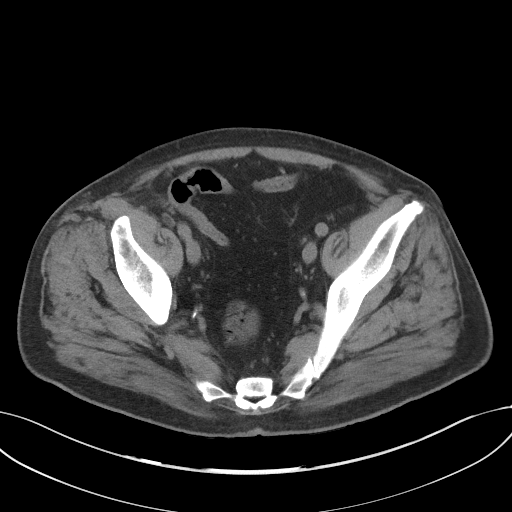
[im 31/96  soft-tissue]
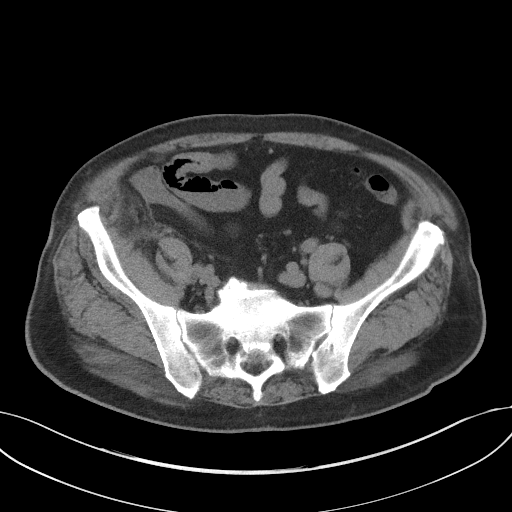
[im 39/96  soft-tissue]
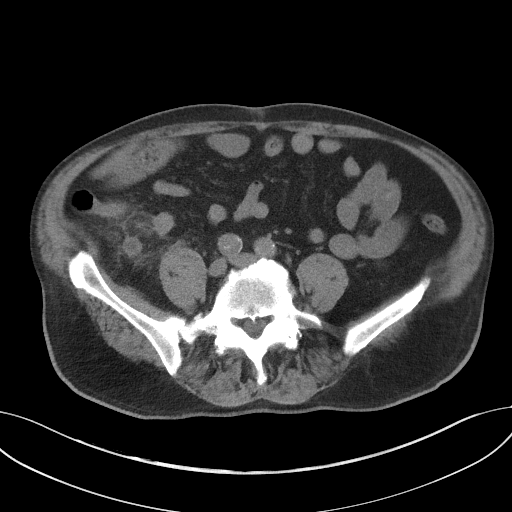
[im 46/96  soft-tissue]
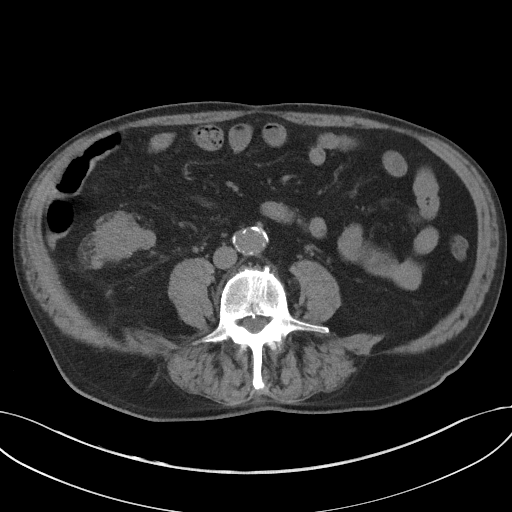
[im 54/96  soft-tissue]
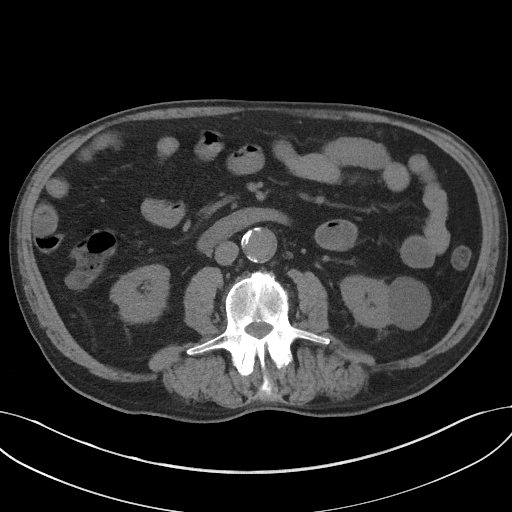
[im 61/96  soft-tissue]
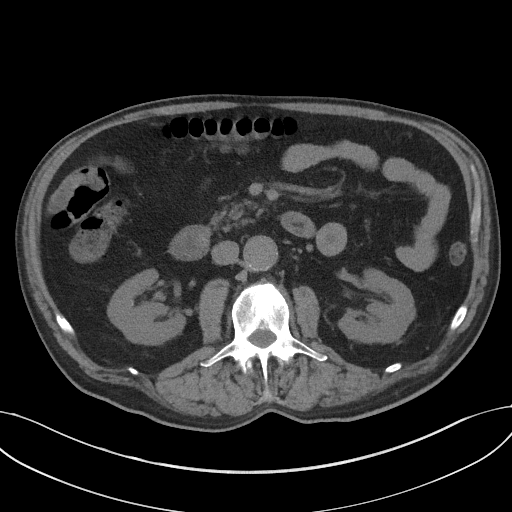
[im 69/96  soft-tissue]
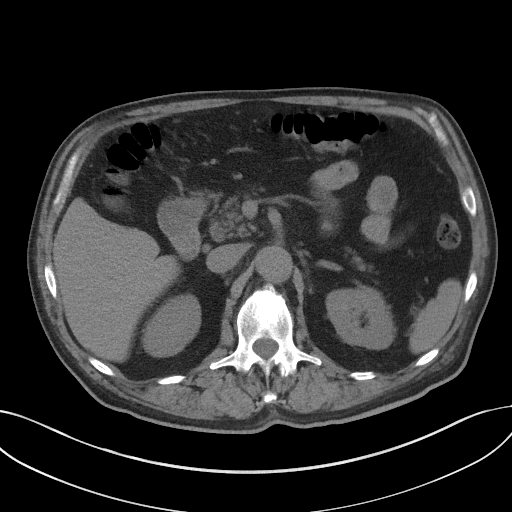
[im 69/96  bone]
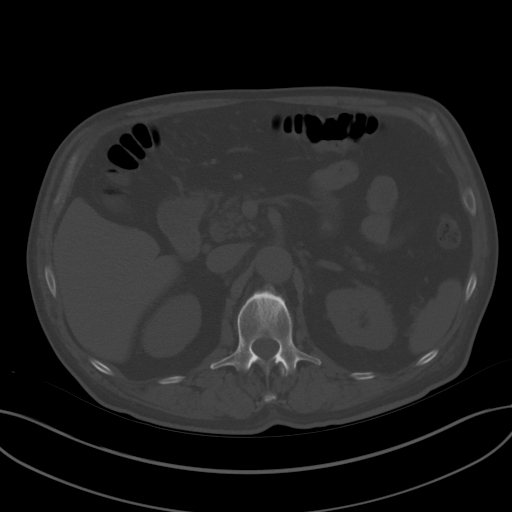
[im 77/96  soft-tissue]
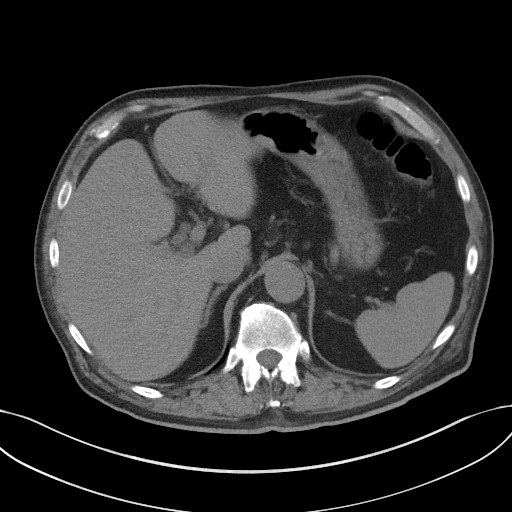
[im 80/96  lung]
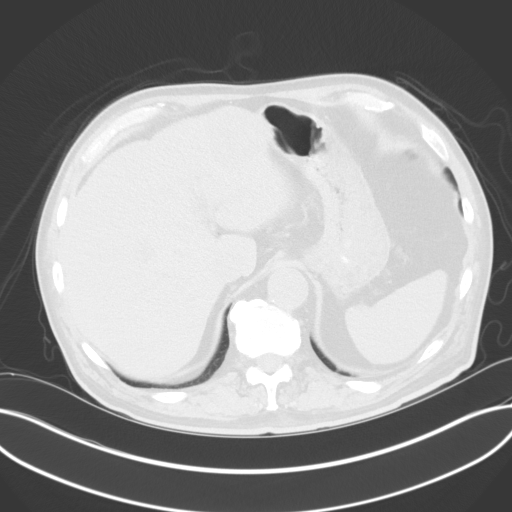
[im 84/96  soft-tissue]
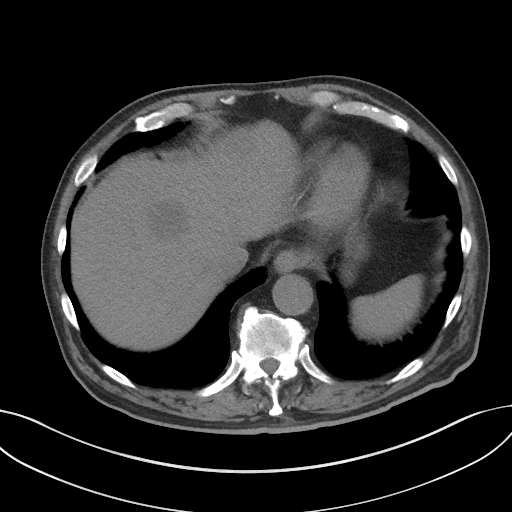
[im 84/96  lung]
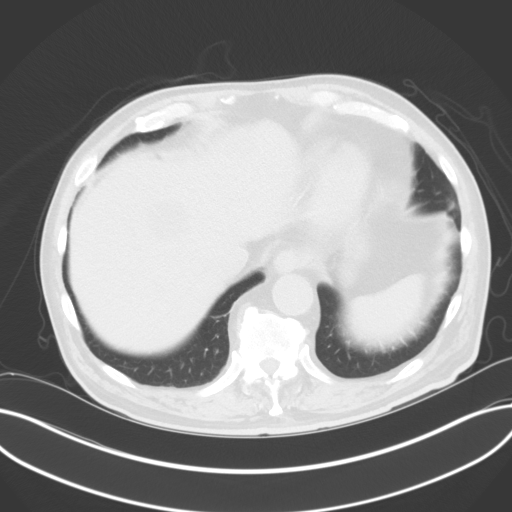
[im 88/96  lung]
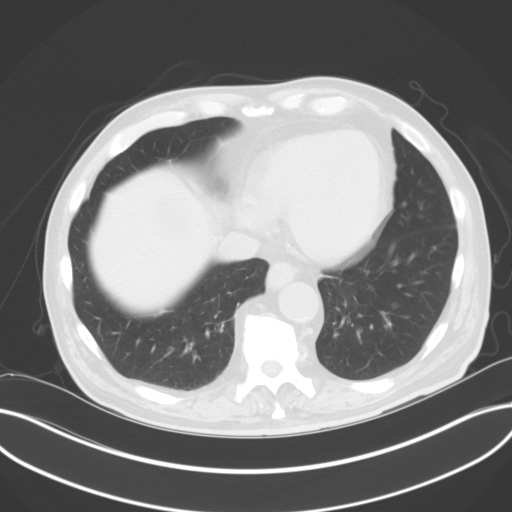
[im 92/96  soft-tissue]
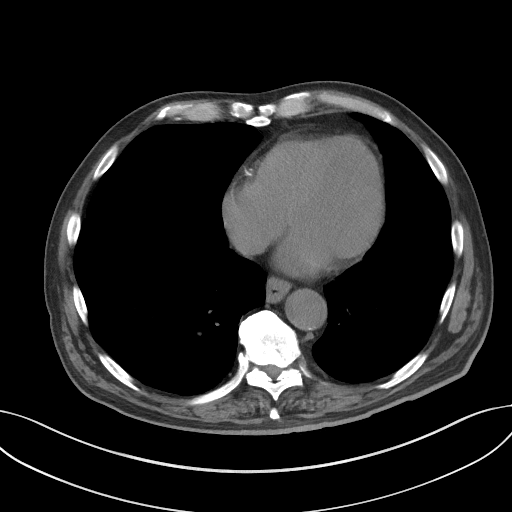
[im 92/96  lung]
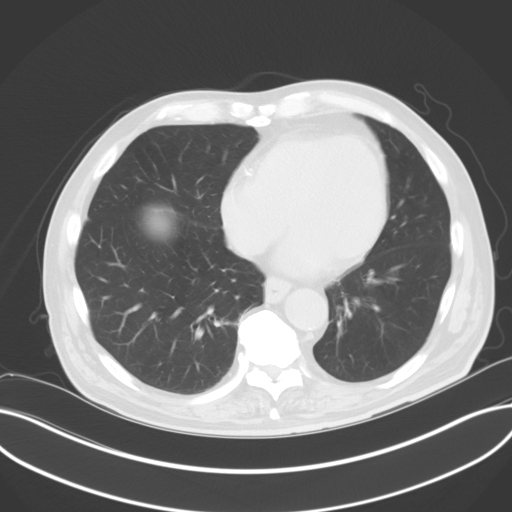

[14 of 32 positions shown; findings below may reference images not displayed]

FINDINGS: Lower chest: No acute abnormality.  Coronary artery calcifications.

Hepatobiliary: There are at least two hypodense masses of the liver,
including of the dome measuring 3.8 cm (series 2, image 9), and of
the subcapsular left lobe of the liver measuring at least 5.5 cm
(series 2, image 17). No gallstones, gallbladder wall thickening, or
biliary dilatation.

Pancreas: Unremarkable. No pancreatic ductal dilatation or
surrounding inflammatory changes.

Spleen: Normal in size without focal abnormality.

Adrenals/Urinary Tract: Adrenal glands are unremarkable. Tiny
nonobstructive calculi of the midportion of the left kidney. Bladder
is unremarkable.

Stomach/Bowel: Stomach is within normal limits. The appendix is
fluid-filled and dilated, measuring 1.6 cm in caliber. There is a
fluid collection at the appendiceal tip measuring approximately
cm (series 2, image 62, series 5, image 41) suspicious for phlegmon
or abscess. There appears to be a mass at the cecal base measuring
approximately 3.9 cm (series 2, image 51, series 5, image 45).

Vascular/Lymphatic: Calcific atherosclerosis. No enlarged abdominal
or pelvic lymph nodes.

Reproductive: No mass or other abnormality.

Other: No abdominal wall hernia or abnormality. No abdominopelvic
ascites.

Musculoskeletal: No acute or significant osseous findings.
IMPRESSION: 1. The appendix is fluid-filled and dilated, measuring 1.6 cm in
caliber. There is a fluid collection at the appendiceal tip
measuring approximately 3.9 cm (series 2, image 62, series 5, image
41) suspicious for phlegmon or abscess. Findings are consistent with
acute appendicitis complicated by phlegmon or abscess.

2. There appears to be a mass at the cecal base measuring
approximately 3.9 cm (series 2, image 51, series 5, image 45). There
are at least two hypodense masses of the liver, including of the
dome measuring 3.8 cm (series 2, image 9), and of the subcapsular
left lobe of the liver measuring at least 5.5 cm (series 2, image
17). Constellation of findings is highly concerning for presentation
of appendicitis secondary to colon malignancy with hepatic
metastatic disease.

3.  Nonobstructive left nephrolithiasis.

## 2019-03-04 MED ORDER — SODIUM CHLORIDE 0.9 % IV SOLN
INTRAVENOUS | Status: DC
  Administered 2019-03-04 – 2019-03-07 (×5): via INTRAVENOUS

## 2019-03-04 MED ORDER — ACETAMINOPHEN 325 MG PO TABS
650.0000 mg | ORAL_TABLET | Freq: Four times a day (QID) | ORAL | Status: DC | PRN
  Administered 2019-03-06 – 2019-03-07 (×2): 650 mg via ORAL
  Filled 2019-03-04 (×2): qty 2

## 2019-03-04 MED ORDER — INSULIN ASPART 100 UNIT/ML ~~LOC~~ SOLN
0.0000 [IU] | Freq: Three times a day (TID) | SUBCUTANEOUS | Status: DC
  Administered 2019-03-05: 3 [IU] via SUBCUTANEOUS
  Administered 2019-03-05 – 2019-03-06 (×2): 2 [IU] via SUBCUTANEOUS
  Administered 2019-03-06: 3 [IU] via SUBCUTANEOUS
  Administered 2019-03-06: 13:00:00 2 [IU] via SUBCUTANEOUS
  Administered 2019-03-07: 12:00:00 3 [IU] via SUBCUTANEOUS
  Administered 2019-03-07: 08:00:00 2 [IU] via SUBCUTANEOUS
  Administered 2019-03-07 – 2019-03-08 (×2): 3 [IU] via SUBCUTANEOUS
  Filled 2019-03-04 (×10): qty 1

## 2019-03-04 MED ORDER — AMLODIPINE BESYLATE 5 MG PO TABS
5.0000 mg | ORAL_TABLET | Freq: Every day | ORAL | Status: DC
  Administered 2019-03-05 – 2019-03-08 (×4): 5 mg via ORAL
  Filled 2019-03-04 (×4): qty 1

## 2019-03-04 MED ORDER — PANTOPRAZOLE SODIUM 40 MG IV SOLR
40.0000 mg | Freq: Every day | INTRAVENOUS | Status: DC
  Administered 2019-03-04 – 2019-03-07 (×4): 40 mg via INTRAVENOUS
  Filled 2019-03-04 (×4): qty 40

## 2019-03-04 MED ORDER — MORPHINE SULFATE (PF) 4 MG/ML IV SOLN
4.0000 mg | INTRAVENOUS | Status: DC | PRN
  Administered 2019-03-06 – 2019-03-07 (×3): 4 mg via INTRAVENOUS
  Filled 2019-03-04 (×3): qty 1

## 2019-03-04 MED ORDER — ACETAMINOPHEN 650 MG RE SUPP: 650.0000 mg | Freq: Four times a day (QID) | RECTAL | Status: DC | PRN

## 2019-03-04 MED ORDER — ONDANSETRON HCL 4 MG/2ML IJ SOLN
4.0000 mg | Freq: Four times a day (QID) | INTRAMUSCULAR | Status: DC | PRN
  Administered 2019-03-06: 22:00:00 4 mg via INTRAVENOUS
  Filled 2019-03-04: qty 2

## 2019-03-04 MED ORDER — HYDROCODONE-ACETAMINOPHEN 5-325 MG PO TABS
1.0000 | ORAL_TABLET | ORAL | Status: DC | PRN
  Administered 2019-03-08: 2 via ORAL
  Filled 2019-03-04: qty 2

## 2019-03-04 MED ORDER — PIPERACILLIN-TAZOBACTAM 3.375 G IVPB 30 MIN
3.3750 g | Freq: Once | INTRAVENOUS | Status: AC
  Administered 2019-03-04: 3.375 g via INTRAVENOUS
  Filled 2019-03-04: qty 50

## 2019-03-04 MED ORDER — SODIUM CHLORIDE 0.9 % IV SOLN
Freq: Once | INTRAVENOUS | Status: AC
  Administered 2019-03-04: 19:00:00 via INTRAVENOUS

## 2019-03-04 MED ORDER — ONDANSETRON 4 MG PO TBDP: 4.0000 mg | ORAL_TABLET | Freq: Four times a day (QID) | ORAL | Status: DC | PRN

## 2019-03-04 MED ORDER — PIPERACILLIN-TAZOBACTAM 3.375 G IVPB
3.3750 g | Freq: Three times a day (TID) | INTRAVENOUS | Status: DC
  Administered 2019-03-04 – 2019-03-07 (×9): 3.375 g via INTRAVENOUS
  Filled 2019-03-04 (×9): qty 50

## 2019-03-04 MED ORDER — ENOXAPARIN SODIUM 40 MG/0.4ML ~~LOC~~ SOLN
40.0000 mg | SUBCUTANEOUS | Status: DC
  Administered 2019-03-04 – 2019-03-06 (×3): 40 mg via SUBCUTANEOUS
  Filled 2019-03-04 (×3): qty 0.4

## 2019-03-04 NOTE — ED Triage Notes (Signed)
Patient c/o abdominal pain and back pain that started on Monday. He states on Monday the pain was all over his abdomen and now it is located on the right side. Denies urinary symptoms. Denies nausea, vomiting and diarrhea. Denies fever.

## 2019-03-04 NOTE — ED Provider Notes (Addendum)
[facility] Emergency Department Provider Note  ____________________________________________   I have reviewed the triage vital signs and the nursing notes. Where available I have reviewed prior notes and, if possible and indicated, outside hospital notes.    HISTORY  Chief Complaint Abdominal Pain    HPI Oscar [name] is a 78 y.o. male  With a history of diabetes mellitus hypertension and hyperlipidemia, presents today from the urgent care during a time of low staffing in the coronavirus emergency.  Patient has had right-sided abdominal pain for the last couple days starting maybe Monday he believes.  It was more significant on Monday Tuesday now it is low level 3 or so out of 10.  No vomiting, no known fever at home.  He presented to urgent care where blood work was obtained which I have reviewed Reassuring liver function tests, reassuring lipase, And reassuring urinalysis although with some limitation secondary to color pigment.  Count was 13.0 hemoglobin 12.4 patient had a urine culture sent and a CT scan which I have reviewed was performed which shows appendicitis with possible malignancy.  Patient was made aware of these findings and sent here for further evaluation.  I do not see in the notes he was given antibiotics yet.  I did check patient's allergies with him he is not allergic to any antibiotics.    Past Medical History:  Diagnosis Date  . Arthritis   . Diabetes mellitus without complication ([place])   . Hyperlipemia   . Hypertension     There are no active problems to display for this patient.   Past Surgical History:  Procedure Laterality Date  . JOINT REPLACEMENT      Prior to Admission medications   Medication Sig Start Date End Date Taking? Authorizing Provider  amLODipine (NORVASC) 5 MG tablet Take 5 mg by mouth daily.    [provider]  fluticasone (FLONASE) 50 MCG/ACT nasal spray Place into the nose.    [provider]  metFORMIN (GLUCOPHAGE) 500 MG tablet Take by mouth. 01/12/18 03/04/19  [provider]  ondansetron (ZOFRAN-ODT) 4 MG disintegrating tablet Take 1 tablet (4 mg total) by mouth every 8 (eight) hours as needed for nausea or vomiting. 04/16/18   Coral Spikes, DO  pravastatin (PRAVACHOL) 40 MG tablet Take 40 mg by mouth daily.    [provider]    Allergies Ace inhibitors  No family history on file.  Social History Social History   Tobacco Use  . Smoking status: Current Every Day Smoker    Packs/day: 1.00    Types: Cigarettes  . Smokeless tobacco: Never Used  Substance Use Topics  . Alcohol use: No  . Drug use: No    Review of Systems Constitutional: No fever/chills Eyes: No visual changes. ENT: No sore throat. No stiff neck no neck pain Cardiovascular: Denies chest pain. Respiratory: Denies shortness of breath. Gastrointestinal:   no vomiting.  No diarrhea.  No constipation. Genitourinary: Negative for dysuria. Musculoskeletal: Negative lower extremity swelling Skin: Negative for rash. Neurological: Negative for severe headaches, focal weakness or numbness.   ____________________________________________   PHYSICAL EXAM:  VITAL SIGNS: ED Triage Vitals [03/04/19 1746]  Enc Vitals Group     BP 118/65     Pulse Rate (!) 102     Resp 16     Temp 99.4 F (37.4 C)     Temp Source Oral     SpO2 96 %     Weight  Height      Head Circumference      Peak Flow      Pain Score 3     Pain Loc      Pain Edu?      Excl. in [place]?     Constitutional: Alert and oriented. Well appearing and in no acute distress. Eyes: Conjunctivae are normal Head: Atraumatic HEENT: No congestion/rhinnorhea. Mucous membranes are moist.  Oropharynx non-erythematous Neck:   Nontender with no meningismus, no masses, no stridor Cardiovascular: Normal rate, regular rhythm. Grossly normal heart sounds.  Good peripheral circulation. Respiratory: Normal  respiratory effort.  No retractions. Lungs CTAB. Abdominal: Soft and is to palpation in the right lower quadrant, voluntary guarding no rebound. No distention.  Back:  There is no focal tenderness or step off.  there is no midline tenderness there are no lesions noted. there is no CVA tenderness Musculoskeletal: No lower extremity tenderness, no upper extremity tenderness. No joint effusions, no DVT signs strong distal pulses no edema Neurologic:  Normal speech and language. No gross focal neurologic deficits are appreciated.  Skin:  Skin is warm, dry and intact. No rash noted. Psychiatric: Mood and affect are normal. Speech and behavior are normal.  ____________________________________________   LABS (all labs ordered are listed, but only abnormal results are displayed)  Labs Reviewed  SARS CORONAVIRUS 2 (HOSPITAL ORDER, [place] [facility])    Pertinent labs  results that were available during my care of the patient were reviewed by me and considered in my medical decision making (see chart for details). ____________________________________________  EKG  I personally interpreted any EKGs ordered by me or triage  ____________________________________________  RADIOLOGY  Pertinent labs & imaging results that were available during my care of the patient were reviewed by me and considered in my medical decision making (see chart for details). If possible, patient and/or family made aware of any abnormal findings.  Dg Chest 2 View  Result Date: 03/04/2019 CLINICAL DATA:  Cough, fever EXAM: CHEST - 2 VIEW COMPARISON:  03/06/2016 FINDINGS: The heart size and mediastinal contours are within normal limits. Both lungs are clear. Disc degenerative disease of the thoracic spine. IMPRESSION: No acute abnormality of the lungs. Electronically Signed   By: Eddie Candle M.D.   On: 03/04/2019 15:39   Ct Renal Stone Study  Result Date: 03/04/2019 CLINICAL DATA:  Right flank pain  EXAM: CT ABDOMEN AND PELVIS WITHOUT CONTRAST TECHNIQUE: Multidetector CT imaging of the abdomen and pelvis was performed following the standard protocol without IV contrast. COMPARISON:  04/16/2018 FINDINGS: Lower chest: No acute abnormality.  Coronary artery calcifications. Hepatobiliary: There are at least two hypodense masses of the liver, including of the dome measuring 3.8 cm (series 2, image 9), and of the subcapsular left lobe of the liver measuring at least 5.5 cm (series 2, image 17). No gallstones, gallbladder wall thickening, or biliary dilatation. Pancreas: Unremarkable. No pancreatic ductal dilatation or surrounding inflammatory changes. Spleen: Normal in size without focal abnormality. Adrenals/Urinary Tract: Adrenal glands are unremarkable. Tiny nonobstructive calculi of the midportion of the left kidney. Bladder is unremarkable. Stomach/Bowel: Stomach is within normal limits. The appendix is fluid-filled and dilated, measuring 1.6 cm in caliber. There is a fluid collection at the appendiceal tip measuring approximately 3.9 cm (series 2, image 62, series 5, image 41) suspicious for phlegmon or abscess. There appears to be a mass at the cecal base measuring approximately 3.9 cm (series 2, image 51, series 5, image 45). Vascular/Lymphatic:  Calcific atherosclerosis. No enlarged abdominal or pelvic lymph nodes. Reproductive: No mass or other abnormality. Other: No abdominal wall hernia or abnormality. No abdominopelvic ascites. Musculoskeletal: No acute or significant osseous findings. IMPRESSION: 1. The appendix is fluid-filled and dilated, measuring 1.6 cm in caliber. There is a fluid collection at the appendiceal tip measuring approximately 3.9 cm (series 2, image 62, series 5, image 41) suspicious for phlegmon or abscess. Findings are consistent with acute appendicitis complicated by phlegmon or abscess. 2. There appears to be a mass at the cecal base measuring approximately 3.9 cm (series 2, image  51, series 5, image 45). There are at least two hypodense masses of the liver, including of the dome measuring 3.8 cm (series 2, image 9), and of the subcapsular left lobe of the liver measuring at least 5.5 cm (series 2, image 17). Constellation of findings is highly concerning for presentation of appendicitis secondary to colon malignancy with hepatic metastatic disease. 3.  Nonobstructive left nephrolithiasis. Electronically Signed   By: Eddie Candle M.D.   On: 03/04/2019 16:26   ____________________________________________    PROCEDURES  Procedure(s) performed: None  Procedures  Critical Care performed: CRITICAL CARE Performed by: Gerda Diss Marleen Moret     ____________________________________________   INITIAL IMPRESSION / ASSESSMENT AND PLAN / ED COURSE  Pertinent labs & imaging results that were available during my care of the patient were reviewed by me and considered in my medical decision making (see chart for details).  Sent here from urgent care with acute appendicitis, and concern for oncologic process of which the patient has been made aware.  I have discussed this with him as well.  Looks like there possibly is an abscess or phlegmon on the CT scan around the appendix I am giving him Zosyn.  We are paging surgery.  He will be admitted.  Discussed with on-call surgeon, Dr. Peyton Najjar, who agrees with management and will admit appreciate consult.    ____________________________________________   FINAL CLINICAL IMPRESSION(S) / ED DIAGNOSES  Final diagnoses:  Appendicitis, unspecified appendicitis type      This chart was dictated using voice recognition software.  Despite best efforts to proofread,  errors can occur which can change meaning.      Schuyler Amor, MD 03/04/19 1906    Schuyler Amor, MD 03/04/19 [phone]

## 2019-03-04 NOTE — ED Triage Notes (Signed)
Pt to ED via POV, pt was seen and evaluated at [facility]. Pt was worked up for abdominal pain that started on Monday. Pt states that the pain is in the lower abdomen. Pt denies N/D/V, fevers or chills but does state that at Urgent Care his temperature was 100.8. Pt is slightly tachycardic in triage at this time with HR of 102. Pt is in NAD.   Per CT scan done at [facility], pt has acute appendicitis with possible appendiceal abscess, liver cancer, and cecum cancer.

## 2019-03-04 NOTE — Discharge Instructions (Signed)
GO TO THE ED FOR ADMISSION.

## 2019-03-04 NOTE — H&P (Signed)
SURGICAL HISTORY AND PHYSICAL NOTE   HISTORY OF PRESENT ILLNESS (HPI):  78 y.o. male presented to [facility] ED for evaluation of abdominal pain. Patient reports started with abdominal pain 6 days ago.  He reports that the pain started on the supra pubic area and now radiated to the right lower quadrant.  Pain now localized to the right lower quadrant.  There is no alleviating or aggravating factor.  Patient denies fever or chills.  Patient reports that pain has aggravated in the last 2 days and he decided to go to the urgent care at [facility].  There he had labs and a CT scan of the abdomen and pelvis and he was found with leukocytosis and an appendicitis with phlegmon.  I personally reviewed the images and the labs.  There is an incidental finding of suspicious ascending colon mass and also with liver lesion suspicious of metastasis.  Surgery is consulted by Dr. Burlene Arnt in this context for evaluation and management of acute appendicitis with abscess.  PAST MEDICAL HISTORY (PM[facility]):  Past Medical History:  Diagnosis Date  . Arthritis   . Diabetes mellitus without complication ([place])   . Hyperlipemia   . Hypertension      PAST SURGICAL HISTORY ([place]):  Past Surgical History:  Procedure Laterality Date  . JOINT REPLACEMENT       MEDICATIONS:  Prior to Admission medications   Medication Sig Start Date End Date Taking? Authorizing Provider  amLODipine (NORVASC) 5 MG tablet Take 5 mg by mouth daily.    [provider]  fluticasone (FLONASE) 50 MCG/ACT nasal spray Place into the nose.    [provider]  metFORMIN (GLUCOPHAGE) 500 MG tablet Take by mouth. 01/12/18 03/04/19  [provider]  ondansetron (ZOFRAN-ODT) 4 MG disintegrating tablet Take 1 tablet (4 mg total) by mouth every 8 (eight) hours as needed for nausea or vomiting. 04/16/18   Coral Spikes, DO  pravastatin (PRAVACHOL) 40 MG tablet Take 40 mg by mouth daily.    [provider]     ALLERGIES:  Allergies   Allergen Reactions  . Ace Inhibitors Swelling     SOCIAL HISTORY:  Social History   Socioeconomic History  . Marital status: Married    Spouse name: Not on file  . Number of children: Not on file  . Years of education: Not on file  . Highest education level: Not on file  Occupational History  . Not on file  Social Needs  . Financial resource strain: Not on file  . Food insecurity:    Worry: Not on file    Inability: Not on file  . Transportation needs:    Medical: Not on file    Non-medical: Not on file  Tobacco Use  . Smoking status: Current Every Day Smoker    Packs/day: 1.00    Types: Cigarettes  . Smokeless tobacco: Never Used  Substance and Sexual Activity  . Alcohol use: No  . Drug use: No  . Sexual activity: Not on file  Lifestyle  . Physical activity:    Days per week: Not on file    Minutes per session: Not on file  . Stress: Not on file  Relationships  . Social connections:    Talks on phone: Not on file    Gets together: Not on file    Attends religious service: Not on file    Active member of club or organization: Not on file    Attends meetings of clubs or organizations:  Not on file    Relationship status: Not on file  . Intimate partner violence:    Fear of current or ex partner: Not on file    Emotionally abused: Not on file    Physically abused: Not on file    Forced sexual activity: Not on file  Other Topics Concern  . Not on file  Social History Narrative  . Not on file    The patient currently resides (home / rehab facility / nursing home): Home The patient normally is (ambulatory / bedbound): Ambulatory   FAMILY HISTORY:  No family history on file.   REVIEW OF SYSTEMS:  Constitutional: denies weight loss, fever, chills, or sweats  Eyes: denies any other vision changes, history of eye injury  ENT: denies sore throat, hearing problems  Respiratory: denies shortness of breath, wheezing  Cardiovascular: denies chest pain,  palpitations  Gastrointestinal: positive abdominal pain, negative nausea and vomiting Genitourinary: denies burning with urination or urinary frequency Musculoskeletal: denies any other joint pains or cramps  Skin: denies any other rashes or skin discolorations  Neurological: denies any other headache, dizziness, weakness  Psychiatric: denies any other depression, anxiety   All other review of systems were negative   VITAL SIGNS:  Temp:  [99.4 F (37.4 C)-100.8 F (38.2 C)] 99.4 F (37.4 C) (05/16 1746) Pulse Rate:  [97-111] 97 (05/16 1930) Resp:  [16-18] 16 (05/16 1746) BP: (91-119)/(45-67) 91/45 (05/16 1930) SpO2:  [96 %-99 %] 99 % (05/16 1930) Weight:  [90.7 kg] 90.7 kg (05/16 1439)             INTAKE/OUTPUT:  This shift: No intake/output data recorded.  Last 2 shifts: @IOLAST2SHIFTS @   PHYSICAL EXAM:  Constitutional:  -- Normal body habitus  -- Awake, alert, and oriented x3  Eyes:  -- Pupils equally round and reactive to light  -- No scleral icterus  Ear, nose, and throat:  -- No jugular venous distension  Pulmonary:  -- No crackles  -- Equal breath sounds bilaterally -- Breathing non-labored at rest Cardiovascular:  -- S1, S2 present  -- No pericardial rubs Gastrointestinal:  -- Abdomen soft, tender to palpation in right lower quadrant, non-distended, no guarding or rebound tenderness -- No abdominal masses appreciated, pulsatile or otherwise  Musculoskeletal and Integumentary:  -- Wounds or skin discoloration: None appreciated -- Extremities: B/L UE and LE FROM, hands and feet warm, no edema  Neurologic:  -- Motor function: intact and symmetric -- Sensation: intact and symmetric   Labs:  CBC Latest Ref Rng & Units 03/04/2019 04/16/2018 03/06/2016  WBC 4.0 - 10.5 K/uL 13.0(H) 11.5(H) 11.6(H)  Hemoglobin 13.0 - 17.0 g/dL 12.4(L) 15.3 14.5  Hematocrit 39.0 - 52.0 % 36.7(L) 43.7 41.4  Platelets 150 - 400 K/uL 332 251 249   CMP Latest Ref Rng & Units 03/04/2019  04/16/2018 03/06/2016  Glucose 70 - 99 mg/dL 242(H) 190(H) 163(H)  BUN 8 - 23 mg/dL 18 13 10   Creatinine 0.61 - 1.24 mg/dL 0.92 1.31(H) 0.91  Sodium 135 - 145 mmol/L 133(L) 138 137  Potassium 3.5 - 5.1 mmol/L 4.1 4.0 5.2(H)  Chloride 98 - 111 mmol/L 103 102 107  CO2 22 - 32 mmol/L 20(L) 23 25  Calcium 8.9 - 10.3 mg/dL 8.4(L) 8.9 8.8(L)  Total Protein 6.5 - 8.1 g/dL 7.0 7.8 -  Total Bilirubin 0.3 - 1.2 mg/dL 0.9 0.5 -  Alkaline Phos 38 - 126 U/L 91 87 -  AST 15 - 41 U/L 22 28 -  ALT  0 - 44 U/L 21 15 -   Imaging studies:  EXAM: CT ABDOMEN AND PELVIS WITHOUT CONTRAST  TECHNIQUE: Multidetector CT imaging of the abdomen and pelvis was performed following the standard protocol without IV contrast.  COMPARISON:  04/16/2018  FINDINGS: Lower chest: No acute abnormality.  Coronary artery calcifications.  Hepatobiliary: There are at least two hypodense masses of the liver, including of the dome measuring 3.8 cm (series 2, image 9), and of the subcapsular left lobe of the liver measuring at least 5.5 cm (series 2, image 17). No gallstones, gallbladder wall thickening, or biliary dilatation.  Pancreas: Unremarkable. No pancreatic ductal dilatation or surrounding inflammatory changes.  Spleen: Normal in size without focal abnormality.  Adrenals/Urinary Tract: Adrenal glands are unremarkable. Tiny nonobstructive calculi of the midportion of the left kidney. Bladder is unremarkable.  Stomach/Bowel: Stomach is within normal limits. The appendix is fluid-filled and dilated, measuring 1.6 cm in caliber. There is a fluid collection at the appendiceal tip measuring approximately 3.9 cm (series 2, image 62, series 5, image 41) suspicious for phlegmon or abscess. There appears to be a mass at the cecal base measuring approximately 3.9 cm (series 2, image 51, series 5, image 45).  Vascular/Lymphatic: Calcific atherosclerosis. No enlarged abdominal or pelvic lymph  nodes.  Reproductive: No mass or other abnormality.  Other: No abdominal wall hernia or abnormality. No abdominopelvic ascites.  Musculoskeletal: No acute or significant osseous findings.  IMPRESSION: 1. The appendix is fluid-filled and dilated, measuring 1.6 cm in caliber. There is a fluid collection at the appendiceal tip measuring approximately 3.9 cm (series 2, image 62, series 5, image 41) suspicious for phlegmon or abscess. Findings are consistent with acute appendicitis complicated by phlegmon or abscess.  2. There appears to be a mass at the cecal base measuring approximately 3.9 cm (series 2, image 51, series 5, image 45). There are at least two hypodense masses of the liver, including of the dome measuring 3.8 cm (series 2, image 9), and of the subcapsular left lobe of the liver measuring at least 5.5 cm (series 2, image 17). Constellation of findings is highly concerning for presentation of appendicitis secondary to colon malignancy with hepatic metastatic disease.  3.  Nonobstructive left nephrolithiasis.   Electronically Signed   By: Eddie Candle M.D.   On: 03/04/2019 16:26  Assessment/Plan:  78 y.o. male with acute appendicitis with phlegmon, complicated by pertinent comorbidities including hypertension and diabetes.  Patient with history, physical exam and images consistent with acute appendicitis.  CT scan without contrast shows phlegmonous process around the appendix.  There is a suspicious cecal mass and suspicious liver metastases.  CT is limited due to non-use of IV contrast.  Patient oriented about diagnosis and recommendation of management with IV antibiotics and repeating the CT scan with contrast to assess the formation of an abscess 2 or 3 days after treatment.  Patient understand that the risk of surgery at this moment are very high.  Patient also is oriented about the incidental finding of suspicious colon cancer with liver metastasis.  At this  point this cannot be confirmed by colonoscopy because of the inflammation of the appendix.  I think that with the CT scan with contrast will have more information about this ascending colon mass in the liver lesions.  Patient understood that he can have the colonoscopy at this moment.  Will treat him as usual appendicitis with phlegmon before doing work-up for suspected malignancy.  Will admit patient with IV antibiotics, critically  diet, IV hydration and pain management.  Patient understand that if he does not respond to conservative management surgical intervention will need to be done.  Arnold Long, MD

## 2019-03-04 NOTE — ED Provider Notes (Addendum)
[address], [place], [place] [address] [phone]   Name: Oscar [name] DOB: 23-Jul-1941 MRN: [id number] CSN: [id number] PCP: Sofie Hartigan, MD  Arrival date and time:  03/04/19 1420  Chief Complaint:  Abdominal Pain; Cough; and Shortness of Breath  NOTE: Prior to seeing the patient today, I have reviewed the triage nursing documentation and vital signs. Clinical staff has updated patient's PMH/PSHx, current medication list, and drug allergies/intolerances to ensure comprehensive history available to assist in medical decision making.   History:   HPI: Oscar [name] is a 78 y.o. male who presents today with complaints of complaints of RIGHT lower back pain, RIGHT flank pain, and pain in the RIGHT lower quadrant. Symptoms began with acute onset on Monday (02/27/2019), at which time he reports that his pain was diffuse in nature. He describes this pain as being "sharp" in quality. Patient notes that his pain was "bad" on Monday and Tuesday of this week. He advises that his symptoms "let up some" on Wednesday, were "ok" on Thursday and Friday, and exacerbated again earlier today.  He denies any associated nausea, vomiting, or changes to his bowel habits. Patient denies urinary symptoms. PMH (+) for nephrolithiasis.   In addition to the aforementioned symptoms, patient has had a non-productive cough and shortness of breath since Monday. He has not experienced any known fevers at home, however patient presents to the clinic today febrile at 100.8. His presenting heart rate noted to be in the 110s. Patient denies travelling and close contact with anyone known to be sick.    Past Medical History:  Diagnosis Date   Arthritis    Diabetes mellitus without complication ([place])    Hyperlipemia    Hypertension     Past Surgical History:  Procedure Laterality Date   JOINT REPLACEMENT      History reviewed. No pertinent family history.  Social History    Socioeconomic History   Marital status: Married    Spouse name: Not on file   Number of children: Not on file   Years of education: Not on file   Highest education level: Not on file  Occupational History   Not on file  Social Needs   Financial resource strain: Not on file   Food insecurity:    Worry: Not on file    Inability: Not on file   Transportation needs:    Medical: Not on file    Non-medical: Not on file  Tobacco Use   Smoking status: Current Every Day Smoker    Packs/day: 1.00    Types: Cigarettes   Smokeless tobacco: Never Used  Substance and Sexual Activity   Alcohol use: No   Drug use: No   Sexual activity: Not on file  Lifestyle   Physical activity:    Days per week: Not on file    Minutes per session: Not on file   Stress: Not on file  Relationships   Social connections:    Talks on phone: Not on file    Gets together: Not on file    Attends religious service: Not on file    Active member of club or organization: Not on file    Attends meetings of clubs or organizations: Not on file    Relationship status: Not on file   Intimate partner violence:    Fear of current or ex partner: Not on file    Emotionally abused: Not on file    Physically abused: Not on file  Forced sexual activity: Not on file  Other Topics Concern   Not on file  Social History Narrative   Not on file    There are no active problems to display for this patient.   Home Medications:    Current Meds  Medication Sig   amLODipine (NORVASC) 5 MG tablet Take 5 mg by mouth daily.   fluticasone (FLONASE) 50 MCG/ACT nasal spray Place into the nose.   metFORMIN (GLUCOPHAGE) 500 MG tablet Take by mouth.   ondansetron (ZOFRAN-ODT) 4 MG disintegrating tablet Take 1 tablet (4 mg total) by mouth every 8 (eight) hours as needed for nausea or vomiting.   pravastatin (PRAVACHOL) 40 MG tablet Take 40 mg by mouth daily.   [DISCONTINUED] HYDROcodone-acetaminophen  (NORCO/VICODIN) 5-325 MG tablet Take 1 tablet by mouth every 6 (six) hours as needed.   [DISCONTINUED] meloxicam (MOBIC) 15 MG tablet Take 15 mg by mouth daily.   [DISCONTINUED] tamsulosin (FLOMAX) 0.4 MG CAPS capsule Take 1 capsule (0.4 mg total) by mouth daily.    Allergies:   Ace inhibitors  Review of Systems (ROS): Review of Systems  Constitutional: Positive for fatigue and fever (Tmax at home unknown - "I didnt even know I had a fever"). Negative for chills.  HENT: Negative for congestion, rhinorrhea, sinus pain and sore throat.   Respiratory: Positive for cough and shortness of breath.        Smoker  Cardiovascular: Negative for chest pain and palpitations.  Gastrointestinal: Positive for abdominal pain (RLQ and flank). Negative for constipation, diarrhea, nausea and vomiting.  Endocrine:       PMH (+) for diabetes  Genitourinary: Positive for flank pain (RIGHT). Negative for dysuria, frequency, hematuria and urgency.  Musculoskeletal: Positive for back pain (RIGHT lower).  Skin: Negative for pallor and rash.  Neurological: Positive for weakness (generalized). Negative for dizziness and headaches.  Hematological: Negative for adenopathy.  Psychiatric/Behavioral: Negative.      Physical Exam:  Triage Vital Signs ED Triage Vitals [03/04/19 1439]  Enc Vitals Group     BP 119/67     Pulse Rate (!) 111     Resp 18     Temp (!) 100.8 F (38.2 C)     Temp Source Oral     SpO2 97 %     Weight 200 lb (90.7 kg)     Height 6' (1.829 m)     Head Circumference      Peak Flow      Pain Score 3     Pain Loc      Pain Edu?      Excl. in [place]?     Physical Exam  Constitutional: He is oriented to person, place, and time and well-developed, well-nourished, and in no distress.  HENT:  Head: Normocephalic and atraumatic.  Right Ear: External ear normal.  Left Ear: External ear normal.  Mouth/Throat: Oropharynx is clear and moist and mucous membranes are normal.  Eyes: Pupils  are equal, round, and reactive to light. Conjunctivae and EOM are normal.  Neck: Normal range of motion. Neck supple. No tracheal deviation present.  Cardiovascular: Regular rhythm, normal heart sounds and intact distal pulses. Tachycardia present. Exam reveals no gallop and no friction rub.  No murmur heard. Pulmonary/Chest: Effort normal and breath sounds normal. No respiratory distress. He has no wheezes. He has no rales.  Abdominal: Soft. Normal appearance and bowel sounds are normal. He exhibits no distension and no ascites. There is no hepatosplenomegaly. There is abdominal tenderness ("  sharp sometimes and sometimes sore") in the right lower quadrant. There is CVA tenderness (mild RIGHT). There is no rebound and no guarding.  Lymphadenopathy:    He has no cervical adenopathy.  Neurological: He is alert and oriented to person, place, and time.  Skin: Skin is warm and dry. No rash noted. No erythema.  Psychiatric: Affect and judgment normal. His mood appears anxious.  Nursing note and vitals reviewed.    Urgent Care Treatments / Results:   LABS: PLEASE NOTE: all labs that were ordered this encounter are listed, however only abnormal results are displayed. Labs Reviewed  URINALYSIS, COMPLETE (UACMP) WITH MICROSCOPIC - Abnormal; Notable for the following components:      Result Value   Color, Urine ORANGE (*)    APPearance CLOUDY (*)    Glucose, UA   (*)    Value: TEST NOT REPORTED DUE TO COLOR INTERFERENCE OF URINE PIGMENT   Hgb urine dipstick   (*)    Value: TEST NOT REPORTED DUE TO COLOR INTERFERENCE OF URINE PIGMENT   Bilirubin Urine   (*)    Value: TEST NOT REPORTED DUE TO COLOR INTERFERENCE OF URINE PIGMENT   Ketones, ur   (*)    Value: TEST NOT REPORTED DUE TO COLOR INTERFERENCE OF URINE PIGMENT   Protein, ur   (*)    Value: TEST NOT REPORTED DUE TO COLOR INTERFERENCE OF URINE PIGMENT   Nitrite   (*)    Value: TEST NOT REPORTED DUE TO COLOR INTERFERENCE OF URINE PIGMENT    Leukocytes,Ua   (*)    Value: TEST NOT REPORTED DUE TO COLOR INTERFERENCE OF URINE PIGMENT   All other components within normal limits  CBC WITH DIFFERENTIAL/PLATELET - Abnormal; Notable for the following components:   WBC 13.0 (*)    RBC 3.97 (*)    Hemoglobin 12.4 (*)    HCT 36.7 (*)    Neutro Abs 10.8 (*)    All other components within normal limits  COMPREHENSIVE METABOLIC PANEL - Abnormal; Notable for the following components:   Sodium 133 (*)    CO2 20 (*)    Glucose, Bld 242 (*)    Calcium 8.4 (*)    Albumin 3.2 (*)    All other components within normal limits  URINE CULTURE  LIPASE, BLOOD    EKG: -None  RADIOLOGY: Dg Chest 2 View  Result Date: 03/04/2019 CLINICAL DATA:  Cough, fever EXAM: CHEST - 2 VIEW COMPARISON:  03/06/2016 FINDINGS: The heart size and mediastinal contours are within normal limits. Both lungs are clear. Disc degenerative disease of the thoracic spine. IMPRESSION: No acute abnormality of the lungs. Electronically Signed   By: Eddie Candle M.D.   On: 03/04/2019 15:39   Ct Renal Stone Study  Result Date: 03/04/2019 CLINICAL DATA:  Right flank pain EXAM: CT ABDOMEN AND PELVIS WITHOUT CONTRAST TECHNIQUE: Multidetector CT imaging of the abdomen and pelvis was performed following the standard protocol without IV contrast. COMPARISON:  04/16/2018 FINDINGS: Lower chest: No acute abnormality.  Coronary artery calcifications. Hepatobiliary: There are at least two hypodense masses of the liver, including of the dome measuring 3.8 cm (series 2, image 9), and of the subcapsular left lobe of the liver measuring at least 5.5 cm (series 2, image 17). No gallstones, gallbladder wall thickening, or biliary dilatation. Pancreas: Unremarkable. No pancreatic ductal dilatation or surrounding inflammatory changes. Spleen: Normal in size without focal abnormality. Adrenals/Urinary Tract: Adrenal glands are unremarkable. Tiny nonobstructive calculi of the midportion of  the left  kidney. Bladder is unremarkable. Stomach/Bowel: Stomach is within normal limits. The appendix is fluid-filled and dilated, measuring 1.6 cm in caliber. There is a fluid collection at the appendiceal tip measuring approximately 3.9 cm (series 2, image 62, series 5, image 41) suspicious for phlegmon or abscess. There appears to be a mass at the cecal base measuring approximately 3.9 cm (series 2, image 51, series 5, image 45). Vascular/Lymphatic: Calcific atherosclerosis. No enlarged abdominal or pelvic lymph nodes. Reproductive: No mass or other abnormality. Other: No abdominal wall hernia or abnormality. No abdominopelvic ascites. Musculoskeletal: No acute or significant osseous findings. IMPRESSION: 1. The appendix is fluid-filled and dilated, measuring 1.6 cm in caliber. There is a fluid collection at the appendiceal tip measuring approximately 3.9 cm (series 2, image 62, series 5, image 41) suspicious for phlegmon or abscess. Findings are consistent with acute appendicitis complicated by phlegmon or abscess. 2. There appears to be a mass at the cecal base measuring approximately 3.9 cm (series 2, image 51, series 5, image 45). There are at least two hypodense masses of the liver, including of the dome measuring 3.8 cm (series 2, image 9), and of the subcapsular left lobe of the liver measuring at least 5.5 cm (series 2, image 17). Constellation of findings is highly concerning for presentation of appendicitis secondary to colon malignancy with hepatic metastatic disease. 3.  Nonobstructive left nephrolithiasis. Electronically Signed   By: Eddie Candle M.D.   On: 03/04/2019 16:26    PRODEDURES: Procedures  MEDICATIONS RECEIVED THIS VISIT: Medications - No data to display  PERTINENT CLINICAL COURSE NOTES/UPDATES: Clinical Course as of Mar 03 1733  Sat Mar 04, 2019  [place] Presenting CC and labs reviewed with attending Lacinda Axon, MD). Based on the fact that patient is febrile and tachycardic, patient felt to  have underlying infectious process. CXR negative.  WBC 13 (ANC 10.8). UA unable to be fully interpreted due to sample color. Urine sample did show granular and hyaline casts, in addition to amorphous crystals. PMH (+) for nephrolithiasis. Will proceed with CT stone study to assess for obstructing stone.    [BG]  1640 Report called to [facility] ED; spoke with Rosana Hoes, RN. Advised of presenting complaints and workup thus far. 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 He is not wanting to go to the ED tonight. I have discussed the importance of him going tonight, as his condition could be life threatening. Patient leaving [place] en route to [facility] ED.    [BG]    Clinical Course User Index [BG] Karen Kitchens, NP   Initial Impression / Assessment and Plan / Urgent Care Course:    TUPAC JEFFUS is a 78 y.o. male who presents to [facility] today with complaints of Abdominal Pain; Cough; and Shortness of Breath  Pertinent labs & imaging results that were available during my care of the patient were personally reviewed by me and considered in my medical decision making (see lab/imaging section of note for values and interpretations).  Patient very non-specific with regards to his presenting symptoms. His timeline and account of his symptoms over the course of the past week were unclear. He was not forthcoming with assessment information, and symptoms only came to light with direct questioning. Patient was not to be anxious about being away from home, as he is the primary caregiver for his wife who has dementia. His physical exam revealed tenderness in the RIGHT lower quadrant of the patient's abdomen. He exhibited mild  CVAT on the RIGHT. Labs revealed a mild leukocytosis (WBC 13,000; ANC 10,800). Renal function and LFTs normal. Na low at 133 mmol/L. Calcium low at 8.4 mg/dL (corrects to 9 mg/dL for albumin of 3.2 g/dL). UA limited due to sample color; (+) casts and crystals. Given his history of urinary  calculi, the decision was made to proceed with CT imaging.   CT scan of the abdomen showed a fluid-filled and dilated appendix measuring 1.6 cm in caliber.  There was an associated fluid collection at the appendiceal tip measuring 3.9 cm suspicious for possible abscess.  Findings consistent with acute appendicitis complicated by possible abscess.  Additionally, there was a mass noted at the cecal base measuring 3.9 cm.  There was a hypodense of mass within hepatic dome measuring 3.8 cm, in addition to a subcapsular LEFT lobe hepatic mass measuring 5.5 cm.  Radiologist Laqueta Carina, MD) notes that imaging constellation highly concerning for presentation of appendicitis secondary to colon malignancy with hepatic metastatic disease. Discussed findings at length with patient.  Advised that he would need to present to [facility] ED for further evaluation and admission to the hospital.  Discussed that admission clinical course would include IV antibiotics and surgery. Reviewed that during surgery, biopsies will be obtained from the cecal mass to further address the concern for colon malignancy with hepatic metastatic disease. Reviewed that patient will likely meet with surgery, gastroenterology, and oncology during his admission.   Patient hesitant about going to the ED tonight, citing that he has concerns regarding who will care for his wife. I explained to the patient that his condition is serious, and that further delay in definitive care could result in overwhelming infection and death. Patient advises that he will have to go home and make arrangements, but he is willing to go into the hospital tonight. I was very clear about the need for patient to be seen tonight. He was advised on several occassions my myself and the nursing staff that this is not something that could wait. Patient verbalized understanding and consent with care directives as discussed.   Report called to [facility] ED charge nurse Rosana Hoes, RN). Patient to  present to ED tonight via POV.   Final Clinical Impressions(s) / Urgent Care Diagnoses:   Final diagnoses:  Cough  Fever, unspecified  Acute appendicitis, unspecified acute appendicitis type  Cecum mass  Liver masses  Hyponatremia    New Prescriptions:  No orders of the defined types were placed in this encounter.   Controlled Substance Prescriptions:  [place] Controlled Substance Registry consulted? Not Applicable  NOTE: This note was prepared using Dragon dictation software along with smaller phrase technology. Despite my best ability to proofread, there is the potential that transcriptional errors may still occur from this process, and are completely unintentional.     Karen Kitchens, NP 03/04/19 1734

## 2019-03-05 LAB — COMPREHENSIVE METABOLIC PANEL
ALT: 18 U/L (ref 0–44)
AST: 17 U/L (ref 15–41)
Albumin: 2.9 g/dL — ABNORMAL LOW (ref 3.5–5.0)
Alkaline Phosphatase: 85 U/L (ref 38–126)
Anion gap: 9 (ref 5–15)
BUN: 16 mg/dL (ref 8–23)
CO2: 21 mmol/L — ABNORMAL LOW (ref 22–32)
Calcium: 8.4 mg/dL — ABNORMAL LOW (ref 8.9–10.3)
Chloride: 106 mmol/L (ref 98–111)
Creatinine, Ser: 0.83 mg/dL (ref 0.61–1.24)
GFR calc Af Amer: >60 mL/min (ref >60)
GFR calc non Af Amer: >60 mL/min (ref >60)
Glucose, Bld: 127 mg/dL — ABNORMAL HIGH (ref 70–99)
Potassium: 3.8 mmol/L (ref 3.5–5.1)
Sodium: 136 mmol/L (ref 135–145)
Total Bilirubin: 1 mg/dL (ref 0.3–1.2)
Total Protein: 6.3 g/dL — ABNORMAL LOW (ref 6.5–8.1)

## 2019-03-05 LAB — GLUCOSE, CAPILLARY
Glucose-Capillary: 146 mg/dL — ABNORMAL HIGH (ref 70–99)
Glucose-Capillary: 153 mg/dL — ABNORMAL HIGH (ref 70–99)
Glucose-Capillary: 107 mg/dL — ABNORMAL HIGH (ref 70–99)

## 2019-03-05 LAB — CBC
HCT: 32.5 % — ABNORMAL LOW (ref 39.0–52.0)
Hemoglobin: 11 g/dL — ABNORMAL LOW (ref 13.0–17.0)
MCH: 30.8 pg (ref 26.0–34.0)
MCHC: 33.8 g/dL (ref 30.0–36.0)
MCV: 91 fL (ref 80.0–100.0)
Platelets: 326 10*3/uL (ref 150–400)
RBC: 3.57 MIL/uL — ABNORMAL LOW (ref 4.22–5.81)
RDW: 12.4 % (ref 11.5–15.5)
WBC: 13.7 10*3/uL — ABNORMAL HIGH (ref 4.0–10.5)
nRBC: 0 % (ref 0.0–0.2)

## 2019-03-05 MED ORDER — SODIUM CHLORIDE 0.9 % IV SOLN
INTRAVENOUS | Status: DC | PRN
  Administered 2019-03-05 – 2019-03-06 (×2): 250 mL via INTRAVENOUS
  Administered 2019-03-07: 18:00:00 via INTRAVENOUS

## 2019-03-05 NOTE — Progress Notes (Signed)
[facility] Hospital Day(s): 1.   Post op day(s):  Marland Kitchen   Interval History: Patient seen and examined, no acute events or new complaints overnight. Patient reports feeling better today.  Patient still has pain on the right lower quadrant.  Denies new episodes of fever.  Denies nausea or vomiting.  Denies diarrhea.  Vital signs in last 24 hours: [min-max] current  Temp:  [98.3 F (36.8 C)-100.8 F (38.2 C)] 99.1 F (37.3 C) (05/17 0433) Pulse Rate:  [93-111] 103 (05/17 0433) Resp:  [16-18] 16 (05/17 0433) BP: (91-159)/(45-68) 137/60 (05/17 0433) SpO2:  [95 %-99 %] 95 % (05/17 0433) Weight:  [90.3 kg-90.7 kg] 90.3 kg (05/16 2143)     Height: 6\' 1"  (185.4 cm) Weight: 90.3 kg BMI (Calculated): 26.27   Physical Exam:  Constitutional: alert, cooperative and no distress  Respiratory: breathing non-labored at rest  Cardiovascular: regular rate and sinus rhythm  Gastrointestinal: soft, tender to palpation in right lower quadrant, and non-distended  Labs:  CBC Latest Ref Rng & Units 03/05/2019 03/04/2019 04/16/2018  WBC 4.0 - 10.5 K/uL 13.7(H) 13.0(H) 11.5(H)  Hemoglobin 13.0 - 17.0 g/dL 11.0(L) 12.4(L) 15.3  Hematocrit 39.0 - 52.0 % 32.5(L) 36.7(L) 43.7  Platelets 150 - 400 K/uL 326 332 251   CMP Latest Ref Rng & Units 03/05/2019 03/04/2019 04/16/2018  Glucose 70 - 99 mg/dL 127(H) 242(H) 190(H)  BUN 8 - 23 mg/dL 16 18 13   Creatinine 0.61 - 1.24 mg/dL 0.83 0.92 1.31(H)  Sodium 135 - 145 mmol/L 136 133(L) 138  Potassium 3.5 - 5.1 mmol/L 3.8 4.1 4.0  Chloride 98 - 111 mmol/L 106 103 102  CO2 22 - 32 mmol/L 21(L) 20(L) 23  Calcium 8.9 - 10.3 mg/dL 8.4(L) 8.4(L) 8.9  Total Protein 6.5 - 8.1 g/dL 6.3(L) 7.0 7.8  Total Bilirubin 0.3 - 1.2 mg/dL 1.0 0.9 0.5  Alkaline Phos 38 - 126 U/L 85 91 87  AST 15 - 41 U/L 17 22 28   ALT 0 - 44 U/L 18 21 15     Imaging studies: No new pertinent imaging studies   Assessment/Plan:  78 y.o. male with acute appendicitis with phlegmon,  complicated by pertinent comorbidities including hypertension and diabetes. Patient with control pain but still tender to palpation.  There is no fever or no tachycardia.  We will continue with IV antibiotic therapy.  Will repeat CT scan after 48 hours from admission for evaluation of the possible abscess in the liver lesions.  Patient encouraged to ambulate.  We will continue clear liquid diet.  We will continue with DVT prophylaxis.   Arnold Long, MD

## 2019-03-06 ENCOUNTER — Encounter: Payer: Self-pay | Admitting: Radiology

## 2019-03-06 ENCOUNTER — Inpatient Hospital Stay: Payer: Medicare HMO

## 2019-03-06 ENCOUNTER — Other Ambulatory Visit: Payer: Self-pay | Admitting: *Deleted

## 2019-03-06 LAB — URINE CULTURE

## 2019-03-06 LAB — GLUCOSE, CAPILLARY
Glucose-Capillary: 135 mg/dL — ABNORMAL HIGH (ref 70–99)
Glucose-Capillary: 162 mg/dL — ABNORMAL HIGH (ref 70–99)
Glucose-Capillary: 147 mg/dL — ABNORMAL HIGH (ref 70–99)
Glucose-Capillary: 134 mg/dL — ABNORMAL HIGH (ref 70–99)
Glucose-Capillary: 132 mg/dL — ABNORMAL HIGH (ref 70–99)

## 2019-03-06 IMAGING — CT CT ABDOMEN AND PELVIS WITH CONTRAST
2 of 5 series · 15 of 46 positions shown, 17 images · IV contrast (APPLIED)
Comparison: CT scan of [DATE].

CLINICAL DATA: Abdominal pain, fever.

EXAM:
CT ABDOMEN AND PELVIS WITH CONTRAST
TECHNIQUE: Multidetector CT imaging of the abdomen and pelvis was performed
using the standard protocol following bolus administration of
intravenous contrast.
CONTRAST:  100mL OMNIPAQUE IOHEXOL 300 MG/ML  SOLN

[Series 2: routine abd/pel with · axial · 0.80mm/px · z∈[-535,-90]mm · 12 of 99 slices shown, 14 images]
[im 5/99  soft-tissue]
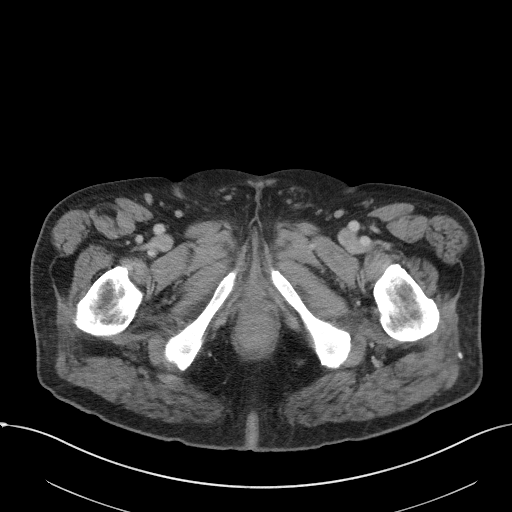
[im 5/99  bone]
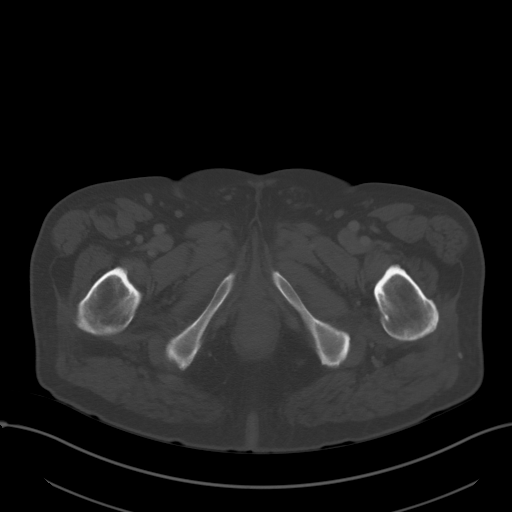
[im 15/99  soft-tissue]
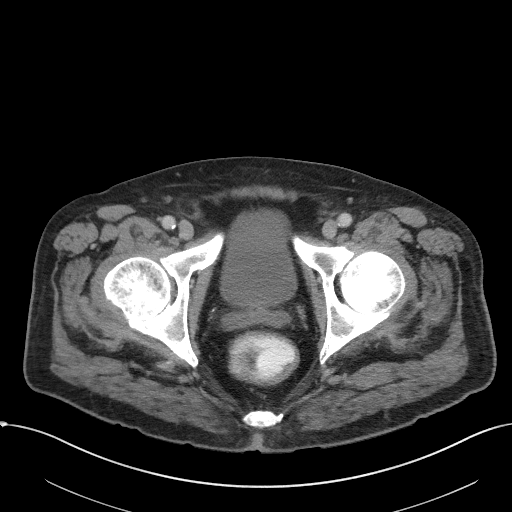
[im 20/99  soft-tissue]
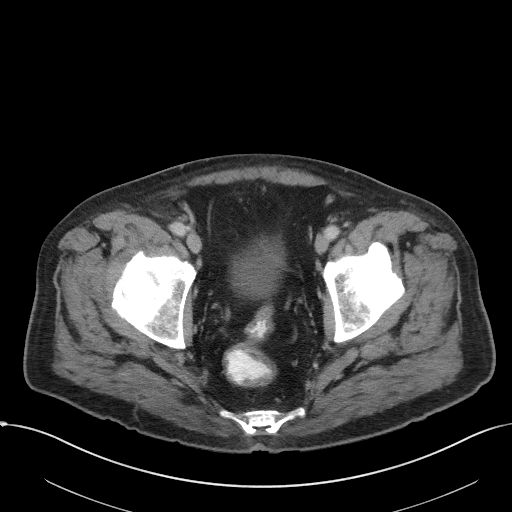
[im 30/99  soft-tissue]
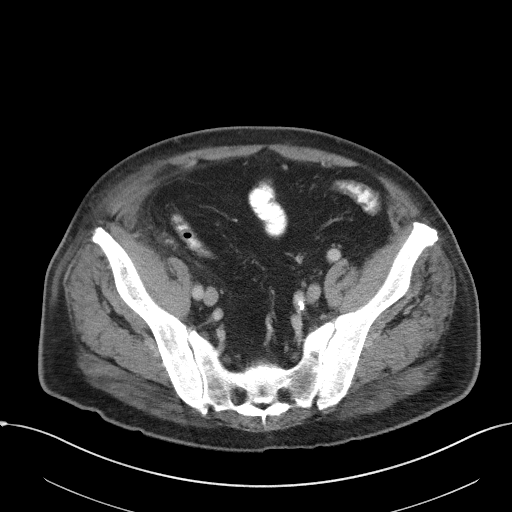
[im 40/99  soft-tissue]
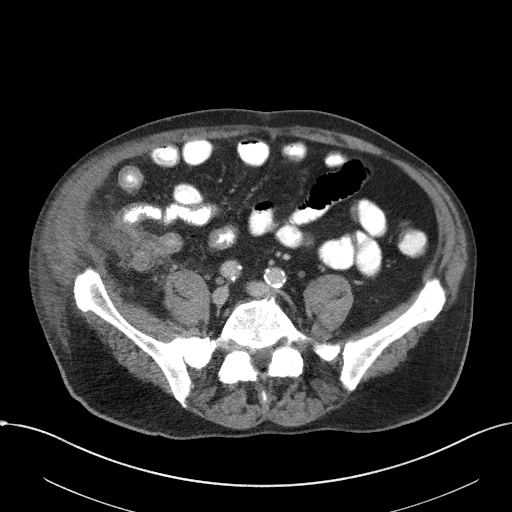
[im 45/99  soft-tissue]
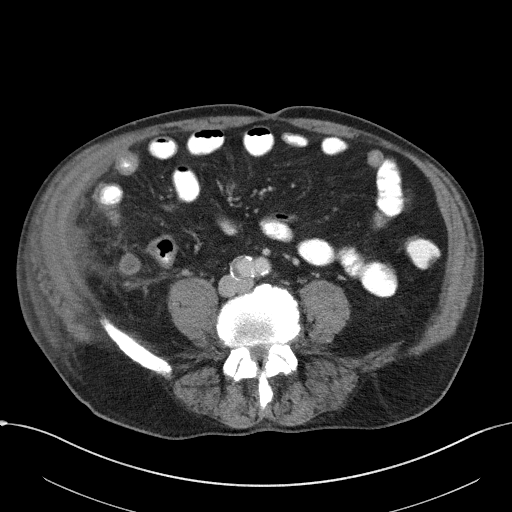
[im 54/99  soft-tissue]
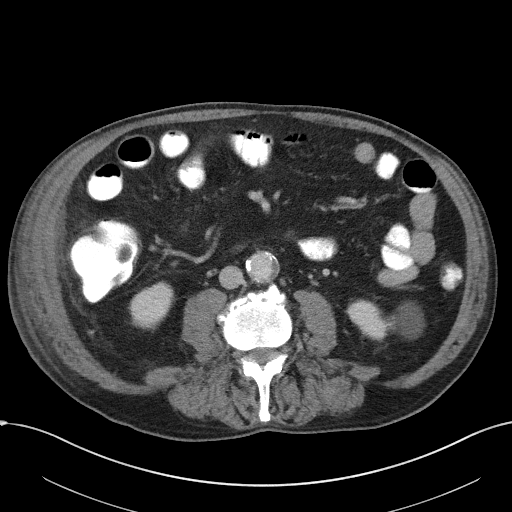
[im 59/99  soft-tissue]
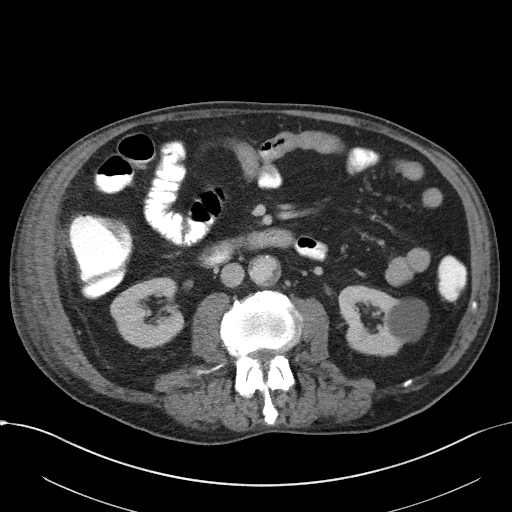
[im 69/99  soft-tissue]
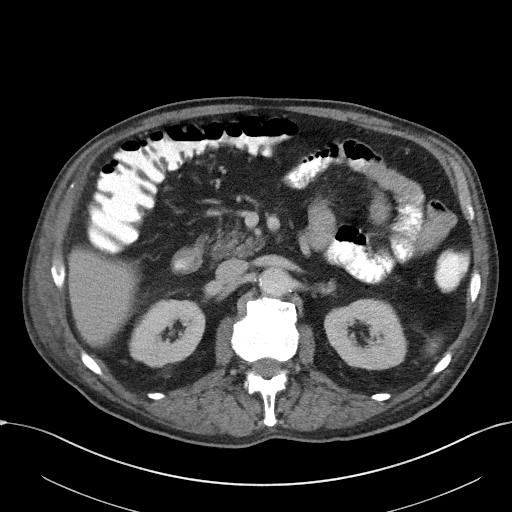
[im 69/99  bone]
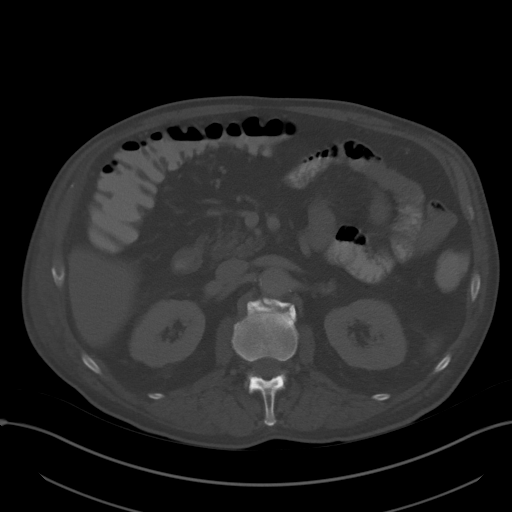
[im 79/99  soft-tissue]
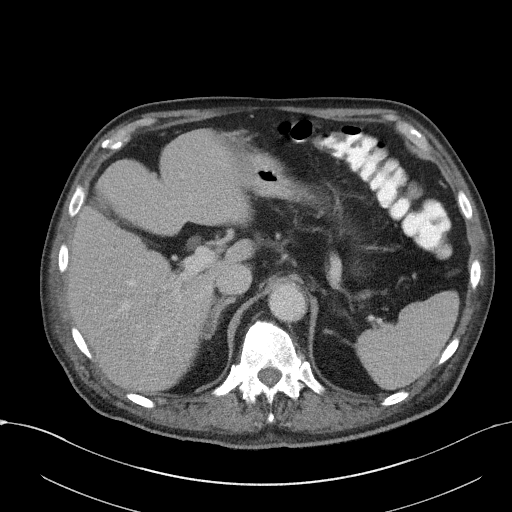
[im 84/99  soft-tissue]
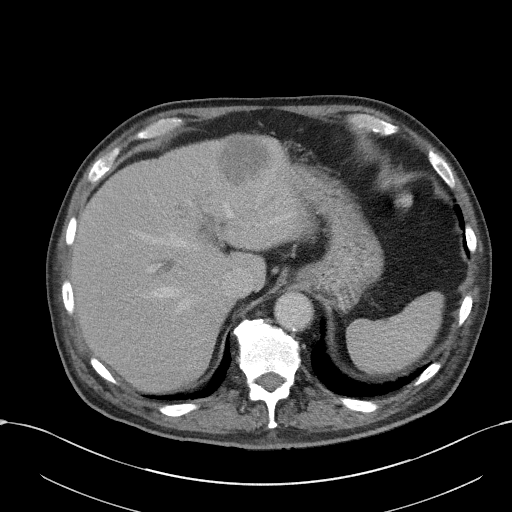
[im 94/99  soft-tissue]
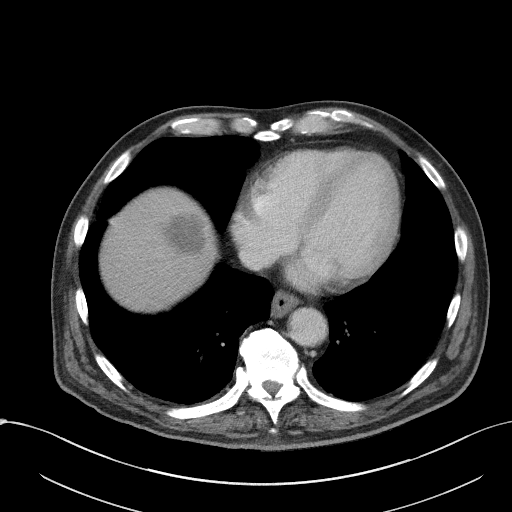

[Series 5: coronal st · coronal · 0.81mm/px · 3 of 102 slices shown]
[im 34/102  soft-tissue]
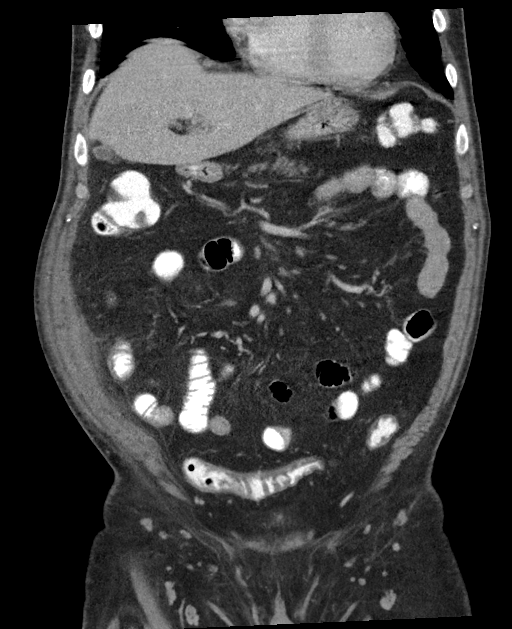
[im 45/102  soft-tissue]
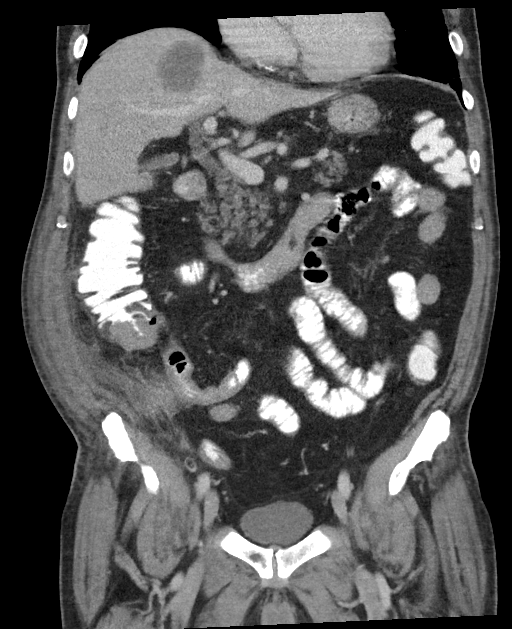
[im 57/102  soft-tissue]
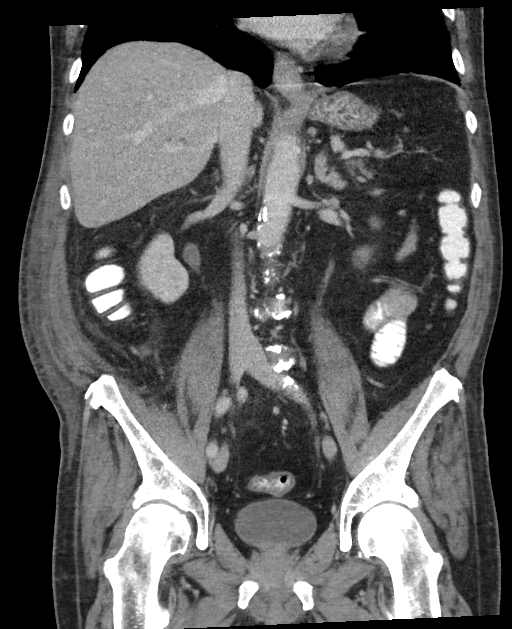

[15 of 46 positions shown; findings below may reference images not displayed]

FINDINGS: Lower chest: No acute abnormality.

Hepatobiliary: No gallstones or biliary dilatation is noted. 2
complex lesions are noted in the liver, 1 measuring 4.5 x 4.0 cm in
right hepatic lobe. The other measures 5.0 x 4.3 cm in left hepatic
lobe. These are consistent with metastatic disease.

Pancreas: Unremarkable. No pancreatic ductal dilatation or
surrounding inflammatory changes.

Spleen: Normal in size without focal abnormality.

Adrenals/Urinary Tract: Adrenal glands appear normal. Left renal
cyst is noted. No hydronephrosis or renal obstruction is noted. No
renal or ureteral calculi are noted. Urinary bladder is
unremarkable.

Stomach/Bowel: The stomach appears normal. There is no evidence of
bowel obstruction. Irregular solid mass is noted in the base of the
cecum concerning for neoplasm or malignancy, measuring approximately
3.6 cm in maximum diameter. There remains enlargement of the
appendix consistent with acute appendicitis. 4.6 x 2.1 cm
ill-defined low density is noted adjacent to the inflamed appendix
concerning for phlegmon or developing abscess. There is noted wall
thickening of the adjacent terminal ileum most likely representing
secondary inflammation.

Vascular/Lymphatic: Aortic atherosclerosis. No enlarged abdominal or
pelvic lymph nodes.

Reproductive: Prostate is unremarkable.

Other: No abdominal wall hernia or abnormality. No abdominopelvic
ascites.

Musculoskeletal: No acute or significant osseous findings.
IMPRESSION: Irregular solid mass is noted in the base of the cecum concerning
for possible colonic malignancy. It measures 3.6 cm in maximum
diameter. There is also continued presence of acute appendicitis
most likely due to obstruction secondary to cecal mass. 4.6 x 2.1 cm
ill-defined low density is noted adjacent to the inflamed appendix
concerning for phlegmon or developing abscess. Wall thickening of
the adjacent terminal ileum is also noted most likely representing
secondary inflammation.

Two large complex lesions are noted in the hepatic parenchyma
consistent with metastatic disease.

Aortic Atherosclerosis ([25]-[25]).

## 2019-03-06 MED ORDER — IOHEXOL 240 MG/ML SOLN
25.0000 mL | INTRAMUSCULAR | Status: AC
  Administered 2019-03-06 (×2): 25 mL via INTRAVENOUS

## 2019-03-06 MED ORDER — IOHEXOL 300 MG/ML  SOLN
100.0000 mL | Freq: Once | INTRAMUSCULAR | Status: AC | PRN
  Administered 2019-03-06: 12:00:00 100 mL via INTRAVENOUS

## 2019-03-06 NOTE — Progress Notes (Signed)
[facility] Day(s): 2.   Post op day(s):  Marland Kitchen   Interval History: Patient seen and examined, no acute events or new complaints overnight. Patient reports having abdominal pain on the right lower quadrant.  Patient had a temp of 100.7 this morning.  Duration nausea or vomiting.  Pain is been controlled with current pain medications.  Vital signs in last 24 hours: [min-max] current  Temp:  [99.3 F (37.4 C)-100.7 F (38.2 C)] 99.9 F (37.7 C) (05/18 0945) Pulse Rate:  [89-102] 102 (05/18 0544) Resp:  [18] 18 (05/18 0544) BP: (118-156)/(46-69) 156/60 (05/18 0544) SpO2:  [96 %-97 %] 96 % (05/18 0544)     Height: 6\' 1"  (185.4 cm) Weight: 90.3 kg BMI (Calculated): 26.27   Physical Exam:  Constitutional: alert, cooperative and no distress  Respiratory: breathing non-labored at rest  Cardiovascular: regular rate and sinus rhythm  Gastrointestinal: soft, tender to palpation in the right lower quadrant, and non-distended  Labs:  CBC Latest Ref Rng & Units 03/05/2019 03/04/2019 04/16/2018  WBC 4.0 - 10.5 K/uL 13.7(H) 13.0(H) 11.5(H)  Hemoglobin 13.0 - 17.0 g/dL 11.0(L) 12.4(L) 15.3  Hematocrit 39.0 - 52.0 % 32.5(L) 36.7(L) 43.7  Platelets 150 - 400 K/uL 326 332 251   CMP Latest Ref Rng & Units 03/05/2019 03/04/2019 04/16/2018  Glucose 70 - 99 mg/dL 127(H) 242(H) 190(H)  BUN 8 - 23 mg/dL 16 18 13   Creatinine 0.61 - 1.24 mg/dL 0.83 0.92 1.31(H)  Sodium 135 - 145 mmol/L 136 133(L) 138  Potassium 3.5 - 5.1 mmol/L 3.8 4.1 4.0  Chloride 98 - 111 mmol/L 106 103 102  CO2 22 - 32 mmol/L 21(L) 20(L) 23  Calcium 8.9 - 10.3 mg/dL 8.4(L) 8.4(L) 8.9  Total Protein 6.5 - 8.1 g/dL 6.3(L) 7.0 7.8  Total Bilirubin 0.3 - 1.2 mg/dL 1.0 0.9 0.5  Alkaline Phos 38 - 126 U/L 85 91 87  AST 15 - 41 U/L 17 22 28   ALT 0 - 44 U/L 18 21 15     Imaging studies: No new pertinent imaging studies   Assessment/Plan:  78 y.o. male with acute appendicitis with phlegmon, complicated by pertinent  comorbidities including hypertension and diabetes. Patient with persistent right lower current pain, leukocytosis and low-grade fevers.  Will repeat CT scan of the abdomen and pelvis with adequate contrast the possibility of the abscess and also for pericarditis station of the cecal mass and liver lesions.  We will continue with IV antibiotic therapy.  We will continue with clear liquid diet until pain improves.  We will discussed with patient CT scan results and continue discussion of recommendations.  We will continue DVT prophylaxis and pain management.  Arnold Long, MD

## 2019-03-07 LAB — CBC WITH DIFFERENTIAL/PLATELET
Abs Immature Granulocytes: 0.15 10*3/uL — ABNORMAL HIGH (ref 0.00–0.07)
Basophils Absolute: 0 10*3/uL (ref 0.0–0.1)
Basophils Relative: 0 %
Eosinophils Absolute: 0.1 10*3/uL (ref 0.0–0.5)
Eosinophils Relative: 0 %
HCT: 31.5 % — ABNORMAL LOW (ref 39.0–52.0)
Hemoglobin: 10.5 g/dL — ABNORMAL LOW (ref 13.0–17.0)
Immature Granulocytes: 1 %
Lymphocytes Relative: 7 %
Lymphs Abs: 1.2 10*3/uL (ref 0.7–4.0)
MCH: 30.3 pg (ref 26.0–34.0)
MCHC: 33.3 g/dL (ref 30.0–36.0)
MCV: 90.8 fL (ref 80.0–100.0)
Monocytes Absolute: 0.8 10*3/uL (ref 0.1–1.0)
Monocytes Relative: 4 %
Neutro Abs: 15.4 10*3/uL — ABNORMAL HIGH (ref 1.7–7.7)
Neutrophils Relative %: 88 %
Platelets: 271 10*3/uL (ref 150–400)
RBC: 3.47 MIL/uL — ABNORMAL LOW (ref 4.22–5.81)
RDW: 12.5 % (ref 11.5–15.5)
WBC: 17.6 10*3/uL — ABNORMAL HIGH (ref 4.0–10.5)
nRBC: 0 % (ref 0.0–0.2)

## 2019-03-07 LAB — GLUCOSE, CAPILLARY
Glucose-Capillary: 127 mg/dL — ABNORMAL HIGH (ref 70–99)
Glucose-Capillary: 173 mg/dL — ABNORMAL HIGH (ref 70–99)
Glucose-Capillary: 161 mg/dL — ABNORMAL HIGH (ref 70–99)
Glucose-Capillary: 111 mg/dL — ABNORMAL HIGH (ref 70–99)

## 2019-03-07 MED ORDER — SODIUM CHLORIDE 0.9 % IV SOLN: INTRAVENOUS | Status: DC

## 2019-03-07 MED ORDER — SODIUM CHLORIDE 0.9 % IV SOLN
1.0000 g | INTRAVENOUS | Status: DC
  Administered 2019-03-07 – 2019-03-08 (×2): 1000 mg via INTRAVENOUS
  Filled 2019-03-07 (×2): qty 1

## 2019-03-07 NOTE — Progress Notes (Signed)
[facility] Hospital Day(s): 3.   Post op day(s):  Marland Kitchen   Interval History: Patient seen and examined, no acute events or new complaints overnight. Patient reports continued having pain on the right lower quadrant.  Pain does not radiate to other part of the body.  Pain aggravated by palpation.  Pain improved with pain medication.  Denies fever chills.  Vital signs in last 24 hours: [min-max] current  Temp:  [99 F (37.2 C)-100 F (37.8 C)] 99.2 F (37.3 C) (05/19 1540) Pulse Rate:  [53-99] 99 (05/19 1540) Resp:  [20-28] 23 (05/19 1540) BP: (123-146)/(61-69) 146/68 (05/19 1227) SpO2:  [94 %-96 %] 95 % (05/19 1540)     Height: 6\' 1"  (185.4 cm) Weight: 90.3 kg BMI (Calculated): 26.27   Physical Exam:  Constitutional: alert, cooperative and no distress  Respiratory: breathing non-labored at rest  Cardiovascular: regular rate and sinus rhythm  Gastrointestinal: soft, tender in right lower quadrant, and non-distended  Labs:  CBC Latest Ref Rng & Units 03/07/2019 03/05/2019 03/04/2019  WBC 4.0 - 10.5 K/uL 17.6(H) 13.7(H) 13.0(H)  Hemoglobin 13.0 - 17.0 g/dL 10.5(L) 11.0(L) 12.4(L)  Hematocrit 39.0 - 52.0 % 31.5(L) 32.5(L) 36.7(L)  Platelets 150 - 400 K/uL 271 326 332   CMP Latest Ref Rng & Units 03/05/2019 03/04/2019 04/16/2018  Glucose 70 - 99 mg/dL 127(H) 242(H) 190(H)  BUN 8 - 23 mg/dL 16 18 13   Creatinine 0.61 - 1.24 mg/dL 0.83 0.92 1.31(H)  Sodium 135 - 145 mmol/L 136 133(L) 138  Potassium 3.5 - 5.1 mmol/L 3.8 4.1 4.0  Chloride 98 - 111 mmol/L 106 103 102  CO2 22 - 32 mmol/L 21(L) 20(L) 23  Calcium 8.9 - 10.3 mg/dL 8.4(L) 8.4(L) 8.9  Total Protein 6.5 - 8.1 g/dL 6.3(L) 7.0 7.8  Total Bilirubin 0.3 - 1.2 mg/dL 1.0 0.9 0.5  Alkaline Phos 38 - 126 U/L 85 91 87  AST 15 - 41 U/L 17 22 28   ALT 0 - 44 U/L 18 21 15     Imaging studies: No new pertinent imaging studies   Assessment/Plan:  78 y.o.malewith acute appendicitis with phlegmon, complicated by pertinent  comorbidities includinghypertension and diabetes. Patient without improvement of abdominal pain.  There was also increasing leukocytosis.  I will change antibiotic therapy to see if that improves infectious control and at the same time improved pain.  I will also optimize pain medications.  CT scan of the abdominal pelvis with contrast shows 2 large liver lesions consistent with metastatic disease.  There is also a large mass in the cecum of the colon. I had a long discussion with the patient, his son and daughter (by phone) were I explained the situation of the patient, discussing the usual treatment of appendicitis with phlegmon and also discussing the usual treatment of stage IV colorectal cancer. This situation is a more complicated because the appendicitis with phlegmon or abscess is usually treated medically and/ort with drainage.  Since the mass looks like it is obstructing the appendix orifice conservative management might not work and patient might need surgical management.  On the other side stage IV colon cancer is usually treated with chemotherapy.  The difficulty is that chemotherapy cannot be given if the patient continue with persistent infection.  If there is a case patient might need surgical management with right colectomy for treatment of complicated appendicitis to the patient can heal and receive his treatment for stage IV colon cancer.  During this long discussion I explained to the  patient and his family that this will be a difficult surgery due to the amount of infection that he has on the right lower quadrant.  The risk of complication is higher due to the inflammation that can be affecting other tissues such as small bowel that can be injury during the surgery.  It also increase the risk of wound infections, bowel obstruction, anastomosis leak, among others. I discussed the case with the interventional radiologist who refers that the right lower current collection is yet not amenable  for drainage.  He will proceed and perform biopsy of the liver lesions to confirm the diagnosis of colorectal metastasis.  With this information we will continue discussing with the patient and the family about the best and safest treatment for the patient.  This encounter was more than 35 minutes face-to-face with the patient, including the family on the phone, most of the time orienting the patient about diagnosis, alternative treatments, and coordinating plan of care.  Arnold Long, MD

## 2019-03-08 DIAGNOSIS — N400 Enlarged prostate without lower urinary tract symptoms: Secondary | ICD-10-CM | POA: Diagnosis not present

## 2019-03-08 DIAGNOSIS — K3580 Unspecified acute appendicitis: Secondary | ICD-10-CM | POA: Diagnosis not present

## 2019-03-08 DIAGNOSIS — I10 Essential (primary) hypertension: Secondary | ICD-10-CM | POA: Diagnosis not present

## 2019-03-08 DIAGNOSIS — K769 Liver disease, unspecified: Secondary | ICD-10-CM | POA: Diagnosis not present

## 2019-03-08 DIAGNOSIS — K37 Unspecified appendicitis: Secondary | ICD-10-CM | POA: Diagnosis not present

## 2019-03-08 DIAGNOSIS — E7800 Pure hypercholesterolemia, unspecified: Secondary | ICD-10-CM | POA: Diagnosis not present

## 2019-03-08 DIAGNOSIS — R9431 Abnormal electrocardiogram [ECG] [EKG]: Secondary | ICD-10-CM | POA: Diagnosis not present

## 2019-03-08 DIAGNOSIS — R160 Hepatomegaly, not elsewhere classified: Secondary | ICD-10-CM | POA: Diagnosis not present

## 2019-03-08 DIAGNOSIS — E440 Moderate protein-calorie malnutrition: Secondary | ICD-10-CM | POA: Diagnosis not present

## 2019-03-08 DIAGNOSIS — K839 Disease of biliary tract, unspecified: Secondary | ICD-10-CM | POA: Diagnosis not present

## 2019-03-08 DIAGNOSIS — K3589 Other acute appendicitis without perforation or gangrene: Secondary | ICD-10-CM | POA: Diagnosis not present

## 2019-03-08 DIAGNOSIS — C180 Malignant neoplasm of cecum: Secondary | ICD-10-CM | POA: Diagnosis not present

## 2019-03-08 DIAGNOSIS — K3532 Acute appendicitis with perforation and localized peritonitis, without abscess: Secondary | ICD-10-CM | POA: Diagnosis not present

## 2019-03-08 DIAGNOSIS — Z720 Tobacco use: Secondary | ICD-10-CM | POA: Diagnosis not present

## 2019-03-08 DIAGNOSIS — R109 Unspecified abdominal pain: Secondary | ICD-10-CM | POA: Diagnosis not present

## 2019-03-08 DIAGNOSIS — C229 Malignant neoplasm of liver, not specified as primary or secondary: Secondary | ICD-10-CM | POA: Diagnosis not present

## 2019-03-08 DIAGNOSIS — K3533 Acute appendicitis with perforation and localized peritonitis, with abscess: Secondary | ICD-10-CM | POA: Diagnosis not present

## 2019-03-08 DIAGNOSIS — G8918 Other acute postprocedural pain: Secondary | ICD-10-CM | POA: Diagnosis not present

## 2019-03-08 DIAGNOSIS — E785 Hyperlipidemia, unspecified: Secondary | ICD-10-CM | POA: Diagnosis not present

## 2019-03-08 DIAGNOSIS — R791 Abnormal coagulation profile: Secondary | ICD-10-CM | POA: Diagnosis not present

## 2019-03-08 DIAGNOSIS — K6389 Other specified diseases of intestine: Secondary | ICD-10-CM | POA: Diagnosis not present

## 2019-03-08 DIAGNOSIS — E119 Type 2 diabetes mellitus without complications: Secondary | ICD-10-CM | POA: Diagnosis not present

## 2019-03-08 DIAGNOSIS — C787 Secondary malignant neoplasm of liver and intrahepatic bile duct: Secondary | ICD-10-CM | POA: Diagnosis not present

## 2019-03-08 LAB — CREATININE, SERUM
Creatinine, Ser: 0.72 mg/dL (ref 0.61–1.24)
GFR calc Af Amer: >60 mL/min (ref >60)
GFR calc non Af Amer: >60 mL/min (ref >60)

## 2019-03-08 LAB — PROTIME-INR
INR: 1.6 — ABNORMAL HIGH (ref 0.8–1.2)
Prothrombin Time: 18.5 seconds — ABNORMAL HIGH (ref 11.4–15.2)

## 2019-03-08 LAB — CEA: CEA: 12.2 ng/mL — ABNORMAL HIGH (ref 0.0–4.7)

## 2019-03-08 LAB — GLUCOSE, CAPILLARY
Glucose-Capillary: 120 mg/dL — ABNORMAL HIGH (ref 70–99)
Glucose-Capillary: 129 mg/dL — ABNORMAL HIGH (ref 70–99)
Glucose-Capillary: 155 mg/dL — ABNORMAL HIGH (ref 70–99)

## 2019-03-08 LAB — APTT: aPTT: 35 seconds (ref 24–36)

## 2019-03-08 MED ORDER — MORPHINE SULFATE (PF) 4 MG/ML IV SOLN: 4.0000 mg | INTRAVENOUS | 0 refills | Status: DC | PRN

## 2019-03-08 MED ORDER — GENERIC EXTERNAL MEDICATION
3.38 | Status: DC
Start: 2019-03-09 — End: 2019-03-08

## 2019-03-08 MED ORDER — OXYCODONE HCL 5 MG PO TABS
5.00 | ORAL_TABLET | ORAL | Status: DC
Start: ? — End: 2019-03-08

## 2019-03-08 MED ORDER — PANTOPRAZOLE SODIUM 40 MG IV SOLR: 40.0000 mg | Freq: Every day | INTRAVENOUS | Status: DC

## 2019-03-08 MED ORDER — INSULIN ASPART 100 UNIT/ML ~~LOC~~ SOLN: 0.0000 [IU] | Freq: Three times a day (TID) | SUBCUTANEOUS | 11 refills | Status: DC

## 2019-03-08 MED ORDER — SENNOSIDES-DOCUSATE SODIUM 8.6-50 MG PO TABS
2.00 | ORAL_TABLET | ORAL | Status: DC
Start: 2019-03-09 — End: 2019-03-08

## 2019-03-08 MED ORDER — ONDANSETRON 4 MG PO TBDP: 4.0000 mg | ORAL_TABLET | Freq: Four times a day (QID) | ORAL | 0 refills | Status: DC | PRN

## 2019-03-08 MED ORDER — HEPARIN SODIUM (PORCINE) 10000 UNIT/ML IJ SOLN
5000.00 | INTRAMUSCULAR | Status: DC
Start: 2019-03-10 — End: 2019-03-08

## 2019-03-08 MED ORDER — ONDANSETRON HCL 4 MG/2ML IJ SOLN: 4.0000 mg | Freq: Four times a day (QID) | INTRAMUSCULAR | 0 refills | Status: DC | PRN

## 2019-03-08 MED ORDER — HYDROCODONE-ACETAMINOPHEN 5-325 MG PO TABS: 1.0000 | ORAL_TABLET | ORAL | 0 refills | Status: DC | PRN

## 2019-03-08 MED ORDER — HYDROMORPHONE HCL 1 MG/ML IJ SOLN
0.50 | INTRAMUSCULAR | Status: DC
Start: ? — End: 2019-03-08

## 2019-03-08 MED ORDER — ONDANSETRON HCL 4 MG/2ML IJ SOLN
4.00 | INTRAMUSCULAR | Status: DC
Start: ? — End: 2019-03-08

## 2019-03-08 MED ORDER — SODIUM CHLORIDE 0.9 % IV SOLN: 1.0000 g | INTRAVENOUS | Status: DC

## 2019-03-08 MED ORDER — KCL IN DEXTROSE-NACL 20-5-0.45 MEQ/L-%-% IV SOLN
INTRAVENOUS | Status: DC
Start: ? — End: 2019-03-08

## 2019-03-08 MED ORDER — ACETAMINOPHEN 325 MG PO TABS
650.00 | ORAL_TABLET | ORAL | Status: DC
Start: 2019-03-09 — End: 2019-03-08

## 2019-03-08 NOTE — Progress Notes (Signed)
Pt's Son Lennette Bihari called to speak with RN and was agitated with Unit Secretary. Requested [name] CN to speak with pt's son.

## 2019-03-08 NOTE — Progress Notes (Signed)
[facility] at bedside. Handoff given. Questions asked and answered. Pt states he called his family to let them know about his transfer. Pt denies further needs at this time.

## 2019-03-08 NOTE — Progress Notes (Signed)
Called report to [facility] RN at [phone]. Questions asked and answered. Pt being transferred via [facility] ([id number]) to [place]. D

## 2019-03-08 NOTE — Progress Notes (Addendum)
Spoke with [employer]. They are awaiting a bed assignment. Pt informed of same. Pt stated the MD told him he had a bed. RN educated pt on the transfer process including requiring an accepting MD, which he has at [facility], and a bed assignment, which we are waiting on. Pt upset that he is not leaving ASAP.

## 2019-03-08 NOTE — Discharge Summary (Signed)
Patient ID: Oscar [name] MRN: [id number] DOB/AGE: Oct 13, 1941 78 y.o.  Admit date: 03/04/2019 Discharge date: 03/08/2019   Discharge Diagnoses:  Active Problems:   Acute appendicitis with appendiceal abscess  Stage IV colon cancer  Procedures: None  Hospital Course: 78 year old male with past medical history of diabetes and hypertension.  He came with 5 4 days history of abdominal pain on right lower quadrant.  CT scan was done and was found with acute appendicitis with phlegmon process.  Incidentally he was found with cecal mass obstructing the appendix orifice with 2 liver lesions.  CT scan was repeated 4 days later with IV contrast which confirmed the 2 liver lesions and no progression of phlegmon to abscess.  Case was discussed with radiology who assessed that phlegmon process was not available for drainage.  They were not also comfortable doing a biopsy of the liver lesions due to active infectious process.  The patient had worsening leukocytosis and persistent significant pain on the right lower quadrant.  This is suggested that patient is not responding to IV antibiotic therapy.  All this finding has been discussed with patient, his son Oscar [name] and his daughter Oscar [name].  Today my final recommendation was to proceed with surgical management of the right colon since the appendicitis was not responding to IV antibiotics.  I discussed that this will be difficult surgery due to the infectious process in the appendix.  Even though these 2 conditions are usually not treated surgically as stage IV colon cancer treated with chemotherapy and appendicitis with phlegmon is treated with IV antibiotic and drainage when abscesses formed.  But due to the none progress of the patient I recommended to proceed with surgical management.  Upon this recommendation patient requested a second opinion and to be transferred to a tertiary center where more resources were available.  This was discussed at [place] and  Dr. Morey Hummingbird from [facility] accepted the patient.  Patient is in stable condition, no fever, no tachycardia and adequate blood pressures.  Patient is stable to tolerate transfer.  Images of the CT scan again to be available [facility].  Consults: None  Disposition: Discharge disposition: [place] Not Defined        Allergies as of 03/08/2019      Reactions   Ace Inhibitors Swelling      Medication List    TAKE these medications   amLODipine 5 MG tablet Commonly known as:  NORVASC Take 5 mg by mouth daily.   ertapenem 1,000 mg in sodium chloride 0.9 % 100 mL Inject 1,000 mg into the vein daily.   fluticasone 50 MCG/ACT nasal spray Commonly known as:  FLONASE Place into the nose.   HYDROcodone-acetaminophen 5-325 MG tablet Commonly known as:  NORCO/VICODIN Take 1-2 tablets by mouth every 4 (four) hours as needed for moderate pain.   insulin aspart 100 UNIT/ML injection Commonly known as:  novoLOG Inject 0-15 Units into the skin 3 (three) times daily with meals.   metFORMIN 500 MG tablet Commonly known as:  GLUCOPHAGE Take by mouth.   morphine 4 MG/ML injection Inject 1 mL (4 mg total) into the vein every 4 (four) hours as needed for severe pain.   ondansetron 4 MG disintegrating tablet Commonly known as:  ZOFRAN-ODT Take 1 tablet (4 mg total) by mouth every 8 (eight) hours as needed for nausea or vomiting. What changed:  Another medication with the same name was added. Make sure you understand how and when to  take each.   ondansetron 4 MG disintegrating tablet Commonly known as:  ZOFRAN-ODT Take 1 tablet (4 mg total) by mouth every 6 (six) hours as needed for nausea. What changed:  You were already taking a medication with the same name, and this prescription was added. Make sure you understand how and when to take each.   ondansetron 4 MG/2ML Soln injection Commonly known as:  ZOFRAN Inject 2 mLs (4 mg total) into the vein  every 6 (six) hours as needed for nausea.   pantoprazole 40 MG injection Commonly known as:  PROTONIX Inject 40 mg into the vein at bedtime.   pravastatin 40 MG tablet Commonly known as:  PRAVACHOL Take 40 mg by mouth daily.       This discharge encounter has been more than 30 minutes most of the time according to patient and his family about his condition and recommendations and coordinating plan of care.

## 2019-03-08 NOTE — Progress Notes (Signed)
Discussed with MD regarding pt and families wish to be transferred to [facility] or other hospital in order to get a second opinion. MD aware and states will start the process. Pt and family aware of possibility that pt will not get a bed today or tomorrow.

## 2019-03-09 ENCOUNTER — Other Ambulatory Visit: Payer: Medicare HMO

## 2019-03-09 DIAGNOSIS — M170 Bilateral primary osteoarthritis of knee: Secondary | ICD-10-CM | POA: Insufficient documentation

## 2019-03-09 DIAGNOSIS — R160 Hepatomegaly, not elsewhere classified: Secondary | ICD-10-CM | POA: Insufficient documentation

## 2019-03-09 DIAGNOSIS — K6389 Other specified diseases of intestine: Secondary | ICD-10-CM | POA: Insufficient documentation

## 2019-03-09 DIAGNOSIS — Z720 Tobacco use: Secondary | ICD-10-CM | POA: Insufficient documentation

## 2019-03-09 NOTE — Progress Notes (Signed)
Tumor Board Documentation  Oscar [name] was presented by Dr Windell Moment at our Tumor Board on 03/09/2019, which included representatives from medical oncology, radiation oncology, surgical oncology, surgical, radiology, pathology, navigation, internal medicine, research, palliative care, pulmonology.  Oscar [name] currently presents for new positive pathology with history of the following treatments:  .  Additionally, we reviewed previous medical and familial history, history of present illness, and recent lab results along with all available histopathologic and imaging studies. The tumor board considered available treatment options and made the following recommendations:      The following procedures/referrals were also placed: No orders of the defined types were placed in this encounter. Patient decided to transfer Care to [place], so did not discuss him  Clinical Trial Status:     Staging used:    National site-specific guidelines   were discussed with respect to the case.  Tumor board is a meeting of clinicians from various specialty areas who evaluate and discuss patients for whom a multidisciplinary approach is being considered. Final determinations in the plan of care are those of the provider(s). The responsibility for follow up of recommendations given during tumor board is that of the provider.   Today's extended care, comprehensive team conference, Oscar [name] was not present for the discussion and was not examined.   Multidisciplinary Tumor Board is a multidisciplinary case peer review process.  Decisions discussed in the Multidisciplinary Tumor Board reflect the opinions of the specialists present at the conference without having examined the patient.  Ultimately, treatment and diagnostic decisions rest with the primary provider(s) and the patient.

## 2019-03-18 DIAGNOSIS — Z48815 Encounter for surgical aftercare following surgery on the digestive system: Secondary | ICD-10-CM | POA: Diagnosis not present

## 2019-03-18 DIAGNOSIS — N200 Calculus of kidney: Secondary | ICD-10-CM | POA: Diagnosis not present

## 2019-03-18 DIAGNOSIS — E119 Type 2 diabetes mellitus without complications: Secondary | ICD-10-CM | POA: Diagnosis not present

## 2019-03-18 DIAGNOSIS — E785 Hyperlipidemia, unspecified: Secondary | ICD-10-CM | POA: Diagnosis not present

## 2019-03-18 DIAGNOSIS — M1711 Unilateral primary osteoarthritis, right knee: Secondary | ICD-10-CM | POA: Diagnosis not present

## 2019-03-18 DIAGNOSIS — I10 Essential (primary) hypertension: Secondary | ICD-10-CM | POA: Diagnosis not present

## 2019-03-18 DIAGNOSIS — R32 Unspecified urinary incontinence: Secondary | ICD-10-CM | POA: Diagnosis not present

## 2019-03-18 DIAGNOSIS — N401 Enlarged prostate with lower urinary tract symptoms: Secondary | ICD-10-CM | POA: Diagnosis not present

## 2019-03-18 DIAGNOSIS — F17210 Nicotine dependence, cigarettes, uncomplicated: Secondary | ICD-10-CM | POA: Diagnosis not present

## 2019-03-22 DIAGNOSIS — M1711 Unilateral primary osteoarthritis, right knee: Secondary | ICD-10-CM | POA: Diagnosis not present

## 2019-03-22 DIAGNOSIS — E785 Hyperlipidemia, unspecified: Secondary | ICD-10-CM | POA: Diagnosis not present

## 2019-03-22 DIAGNOSIS — R32 Unspecified urinary incontinence: Secondary | ICD-10-CM | POA: Diagnosis not present

## 2019-03-22 DIAGNOSIS — N200 Calculus of kidney: Secondary | ICD-10-CM | POA: Diagnosis not present

## 2019-03-22 DIAGNOSIS — F17210 Nicotine dependence, cigarettes, uncomplicated: Secondary | ICD-10-CM | POA: Diagnosis not present

## 2019-03-22 DIAGNOSIS — E119 Type 2 diabetes mellitus without complications: Secondary | ICD-10-CM | POA: Diagnosis not present

## 2019-03-22 DIAGNOSIS — N401 Enlarged prostate with lower urinary tract symptoms: Secondary | ICD-10-CM | POA: Diagnosis not present

## 2019-03-22 DIAGNOSIS — Z48815 Encounter for surgical aftercare following surgery on the digestive system: Secondary | ICD-10-CM | POA: Diagnosis not present

## 2019-03-22 DIAGNOSIS — I10 Essential (primary) hypertension: Secondary | ICD-10-CM | POA: Diagnosis not present

## 2019-03-23 DIAGNOSIS — M1711 Unilateral primary osteoarthritis, right knee: Secondary | ICD-10-CM | POA: Diagnosis not present

## 2019-03-23 DIAGNOSIS — C189 Malignant neoplasm of colon, unspecified: Secondary | ICD-10-CM | POA: Diagnosis not present

## 2019-03-23 DIAGNOSIS — N401 Enlarged prostate with lower urinary tract symptoms: Secondary | ICD-10-CM | POA: Diagnosis not present

## 2019-03-23 DIAGNOSIS — E785 Hyperlipidemia, unspecified: Secondary | ICD-10-CM | POA: Diagnosis not present

## 2019-03-23 DIAGNOSIS — R32 Unspecified urinary incontinence: Secondary | ICD-10-CM | POA: Diagnosis not present

## 2019-03-23 DIAGNOSIS — F17210 Nicotine dependence, cigarettes, uncomplicated: Secondary | ICD-10-CM | POA: Diagnosis not present

## 2019-03-23 DIAGNOSIS — I10 Essential (primary) hypertension: Secondary | ICD-10-CM | POA: Diagnosis not present

## 2019-03-23 DIAGNOSIS — N200 Calculus of kidney: Secondary | ICD-10-CM | POA: Diagnosis not present

## 2019-03-23 DIAGNOSIS — E119 Type 2 diabetes mellitus without complications: Secondary | ICD-10-CM | POA: Diagnosis not present

## 2019-03-23 DIAGNOSIS — Z48815 Encounter for surgical aftercare following surgery on the digestive system: Secondary | ICD-10-CM | POA: Diagnosis not present

## 2019-03-23 DIAGNOSIS — C787 Secondary malignant neoplasm of liver and intrahepatic bile duct: Secondary | ICD-10-CM | POA: Diagnosis not present

## 2019-03-24 DIAGNOSIS — C787 Secondary malignant neoplasm of liver and intrahepatic bile duct: Secondary | ICD-10-CM | POA: Diagnosis not present

## 2019-03-24 DIAGNOSIS — C182 Malignant neoplasm of ascending colon: Secondary | ICD-10-CM | POA: Diagnosis not present

## 2019-03-27 DIAGNOSIS — E119 Type 2 diabetes mellitus without complications: Secondary | ICD-10-CM | POA: Diagnosis not present

## 2019-03-27 DIAGNOSIS — M1711 Unilateral primary osteoarthritis, right knee: Secondary | ICD-10-CM | POA: Diagnosis not present

## 2019-03-27 DIAGNOSIS — Z9049 Acquired absence of other specified parts of digestive tract: Secondary | ICD-10-CM | POA: Diagnosis not present

## 2019-03-27 DIAGNOSIS — I10 Essential (primary) hypertension: Secondary | ICD-10-CM | POA: Diagnosis not present

## 2019-03-27 DIAGNOSIS — E785 Hyperlipidemia, unspecified: Secondary | ICD-10-CM | POA: Diagnosis not present

## 2019-03-29 DIAGNOSIS — E119 Type 2 diabetes mellitus without complications: Secondary | ICD-10-CM | POA: Diagnosis not present

## 2019-03-29 DIAGNOSIS — E785 Hyperlipidemia, unspecified: Secondary | ICD-10-CM | POA: Diagnosis not present

## 2019-03-29 DIAGNOSIS — R32 Unspecified urinary incontinence: Secondary | ICD-10-CM | POA: Diagnosis not present

## 2019-03-29 DIAGNOSIS — M1711 Unilateral primary osteoarthritis, right knee: Secondary | ICD-10-CM | POA: Diagnosis not present

## 2019-03-29 DIAGNOSIS — Z48815 Encounter for surgical aftercare following surgery on the digestive system: Secondary | ICD-10-CM | POA: Diagnosis not present

## 2019-03-29 DIAGNOSIS — I10 Essential (primary) hypertension: Secondary | ICD-10-CM | POA: Diagnosis not present

## 2019-03-29 DIAGNOSIS — N200 Calculus of kidney: Secondary | ICD-10-CM | POA: Diagnosis not present

## 2019-03-29 DIAGNOSIS — N401 Enlarged prostate with lower urinary tract symptoms: Secondary | ICD-10-CM | POA: Diagnosis not present

## 2019-03-29 DIAGNOSIS — F17210 Nicotine dependence, cigarettes, uncomplicated: Secondary | ICD-10-CM | POA: Diagnosis not present

## 2019-04-03 DIAGNOSIS — N200 Calculus of kidney: Secondary | ICD-10-CM | POA: Diagnosis not present

## 2019-04-03 DIAGNOSIS — I10 Essential (primary) hypertension: Secondary | ICD-10-CM | POA: Diagnosis not present

## 2019-04-03 DIAGNOSIS — Z48815 Encounter for surgical aftercare following surgery on the digestive system: Secondary | ICD-10-CM | POA: Diagnosis not present

## 2019-04-03 DIAGNOSIS — F17210 Nicotine dependence, cigarettes, uncomplicated: Secondary | ICD-10-CM | POA: Diagnosis not present

## 2019-04-03 DIAGNOSIS — R32 Unspecified urinary incontinence: Secondary | ICD-10-CM | POA: Diagnosis not present

## 2019-04-03 DIAGNOSIS — N401 Enlarged prostate with lower urinary tract symptoms: Secondary | ICD-10-CM | POA: Diagnosis not present

## 2019-04-03 DIAGNOSIS — E119 Type 2 diabetes mellitus without complications: Secondary | ICD-10-CM | POA: Diagnosis not present

## 2019-04-03 DIAGNOSIS — E785 Hyperlipidemia, unspecified: Secondary | ICD-10-CM | POA: Diagnosis not present

## 2019-04-03 DIAGNOSIS — M1711 Unilateral primary osteoarthritis, right knee: Secondary | ICD-10-CM | POA: Diagnosis not present

## 2019-04-04 DIAGNOSIS — I10 Essential (primary) hypertension: Secondary | ICD-10-CM | POA: Diagnosis not present

## 2019-04-04 DIAGNOSIS — F17210 Nicotine dependence, cigarettes, uncomplicated: Secondary | ICD-10-CM | POA: Diagnosis not present

## 2019-04-04 DIAGNOSIS — E119 Type 2 diabetes mellitus without complications: Secondary | ICD-10-CM | POA: Diagnosis not present

## 2019-04-04 DIAGNOSIS — E785 Hyperlipidemia, unspecified: Secondary | ICD-10-CM | POA: Diagnosis not present

## 2019-04-04 DIAGNOSIS — N401 Enlarged prostate with lower urinary tract symptoms: Secondary | ICD-10-CM | POA: Diagnosis not present

## 2019-04-04 DIAGNOSIS — R32 Unspecified urinary incontinence: Secondary | ICD-10-CM | POA: Diagnosis not present

## 2019-04-04 DIAGNOSIS — N200 Calculus of kidney: Secondary | ICD-10-CM | POA: Diagnosis not present

## 2019-04-04 DIAGNOSIS — M1711 Unilateral primary osteoarthritis, right knee: Secondary | ICD-10-CM | POA: Diagnosis not present

## 2019-04-04 DIAGNOSIS — Z48815 Encounter for surgical aftercare following surgery on the digestive system: Secondary | ICD-10-CM | POA: Diagnosis not present

## 2019-04-05 DIAGNOSIS — R32 Unspecified urinary incontinence: Secondary | ICD-10-CM | POA: Diagnosis not present

## 2019-04-05 DIAGNOSIS — I10 Essential (primary) hypertension: Secondary | ICD-10-CM | POA: Diagnosis not present

## 2019-04-05 DIAGNOSIS — E785 Hyperlipidemia, unspecified: Secondary | ICD-10-CM | POA: Diagnosis not present

## 2019-04-05 DIAGNOSIS — E119 Type 2 diabetes mellitus without complications: Secondary | ICD-10-CM | POA: Diagnosis not present

## 2019-04-05 DIAGNOSIS — Z48815 Encounter for surgical aftercare following surgery on the digestive system: Secondary | ICD-10-CM | POA: Diagnosis not present

## 2019-04-05 DIAGNOSIS — M1711 Unilateral primary osteoarthritis, right knee: Secondary | ICD-10-CM | POA: Diagnosis not present

## 2019-04-05 DIAGNOSIS — F17210 Nicotine dependence, cigarettes, uncomplicated: Secondary | ICD-10-CM | POA: Diagnosis not present

## 2019-04-05 DIAGNOSIS — N200 Calculus of kidney: Secondary | ICD-10-CM | POA: Diagnosis not present

## 2019-04-05 DIAGNOSIS — N401 Enlarged prostate with lower urinary tract symptoms: Secondary | ICD-10-CM | POA: Diagnosis not present

## 2019-04-07 DIAGNOSIS — E785 Hyperlipidemia, unspecified: Secondary | ICD-10-CM | POA: Diagnosis not present

## 2019-04-07 DIAGNOSIS — R32 Unspecified urinary incontinence: Secondary | ICD-10-CM | POA: Diagnosis not present

## 2019-04-07 DIAGNOSIS — N200 Calculus of kidney: Secondary | ICD-10-CM | POA: Diagnosis not present

## 2019-04-07 DIAGNOSIS — M1711 Unilateral primary osteoarthritis, right knee: Secondary | ICD-10-CM | POA: Diagnosis not present

## 2019-04-07 DIAGNOSIS — N401 Enlarged prostate with lower urinary tract symptoms: Secondary | ICD-10-CM | POA: Diagnosis not present

## 2019-04-07 DIAGNOSIS — E119 Type 2 diabetes mellitus without complications: Secondary | ICD-10-CM | POA: Diagnosis not present

## 2019-04-07 DIAGNOSIS — F17210 Nicotine dependence, cigarettes, uncomplicated: Secondary | ICD-10-CM | POA: Diagnosis not present

## 2019-04-07 DIAGNOSIS — Z48815 Encounter for surgical aftercare following surgery on the digestive system: Secondary | ICD-10-CM | POA: Diagnosis not present

## 2019-04-07 DIAGNOSIS — I10 Essential (primary) hypertension: Secondary | ICD-10-CM | POA: Diagnosis not present

## 2019-04-11 DIAGNOSIS — R32 Unspecified urinary incontinence: Secondary | ICD-10-CM | POA: Diagnosis not present

## 2019-04-11 DIAGNOSIS — E119 Type 2 diabetes mellitus without complications: Secondary | ICD-10-CM | POA: Diagnosis not present

## 2019-04-11 DIAGNOSIS — I10 Essential (primary) hypertension: Secondary | ICD-10-CM | POA: Diagnosis not present

## 2019-04-11 DIAGNOSIS — M1711 Unilateral primary osteoarthritis, right knee: Secondary | ICD-10-CM | POA: Diagnosis not present

## 2019-04-11 DIAGNOSIS — N200 Calculus of kidney: Secondary | ICD-10-CM | POA: Diagnosis not present

## 2019-04-11 DIAGNOSIS — N401 Enlarged prostate with lower urinary tract symptoms: Secondary | ICD-10-CM | POA: Diagnosis not present

## 2019-04-11 DIAGNOSIS — Z48815 Encounter for surgical aftercare following surgery on the digestive system: Secondary | ICD-10-CM | POA: Diagnosis not present

## 2019-04-11 DIAGNOSIS — E785 Hyperlipidemia, unspecified: Secondary | ICD-10-CM | POA: Diagnosis not present

## 2019-04-11 DIAGNOSIS — F17210 Nicotine dependence, cigarettes, uncomplicated: Secondary | ICD-10-CM | POA: Diagnosis not present

## 2019-04-12 DIAGNOSIS — N200 Calculus of kidney: Secondary | ICD-10-CM | POA: Diagnosis not present

## 2019-04-12 DIAGNOSIS — R32 Unspecified urinary incontinence: Secondary | ICD-10-CM | POA: Diagnosis not present

## 2019-04-12 DIAGNOSIS — F17210 Nicotine dependence, cigarettes, uncomplicated: Secondary | ICD-10-CM | POA: Diagnosis not present

## 2019-04-12 DIAGNOSIS — M1711 Unilateral primary osteoarthritis, right knee: Secondary | ICD-10-CM | POA: Diagnosis not present

## 2019-04-12 DIAGNOSIS — N401 Enlarged prostate with lower urinary tract symptoms: Secondary | ICD-10-CM | POA: Diagnosis not present

## 2019-04-12 DIAGNOSIS — E785 Hyperlipidemia, unspecified: Secondary | ICD-10-CM | POA: Diagnosis not present

## 2019-04-12 DIAGNOSIS — Z48815 Encounter for surgical aftercare following surgery on the digestive system: Secondary | ICD-10-CM | POA: Diagnosis not present

## 2019-04-12 DIAGNOSIS — I10 Essential (primary) hypertension: Secondary | ICD-10-CM | POA: Diagnosis not present

## 2019-04-12 DIAGNOSIS — E119 Type 2 diabetes mellitus without complications: Secondary | ICD-10-CM | POA: Diagnosis not present

## 2019-04-13 DIAGNOSIS — E119 Type 2 diabetes mellitus without complications: Secondary | ICD-10-CM | POA: Diagnosis not present

## 2019-04-13 DIAGNOSIS — Z48815 Encounter for surgical aftercare following surgery on the digestive system: Secondary | ICD-10-CM | POA: Diagnosis not present

## 2019-04-13 DIAGNOSIS — R32 Unspecified urinary incontinence: Secondary | ICD-10-CM | POA: Diagnosis not present

## 2019-04-13 DIAGNOSIS — N200 Calculus of kidney: Secondary | ICD-10-CM | POA: Diagnosis not present

## 2019-04-13 DIAGNOSIS — E785 Hyperlipidemia, unspecified: Secondary | ICD-10-CM | POA: Diagnosis not present

## 2019-04-13 DIAGNOSIS — I10 Essential (primary) hypertension: Secondary | ICD-10-CM | POA: Diagnosis not present

## 2019-04-13 DIAGNOSIS — I499 Cardiac arrhythmia, unspecified: Secondary | ICD-10-CM | POA: Diagnosis not present

## 2019-04-13 DIAGNOSIS — N401 Enlarged prostate with lower urinary tract symptoms: Secondary | ICD-10-CM | POA: Diagnosis not present

## 2019-04-13 DIAGNOSIS — R351 Nocturia: Secondary | ICD-10-CM | POA: Diagnosis not present

## 2019-04-13 DIAGNOSIS — M1711 Unilateral primary osteoarthritis, right knee: Secondary | ICD-10-CM | POA: Diagnosis not present

## 2019-04-13 DIAGNOSIS — F17210 Nicotine dependence, cigarettes, uncomplicated: Secondary | ICD-10-CM | POA: Diagnosis not present

## 2019-04-19 IMAGING — CR DG ABDOMEN 2V
1 series · 3 of 3 positions shown · non-contrast
Comparison: [DATE] CT

CLINICAL DATA: Diarrhea for 1.5 weeks. Abdominal pain for
weeks. Colon cancer. History of constipation.

EXAM:
ABDOMEN - 2 VIEW

[Series 1: dg abd 2 views · 0.14mm/px · 3 of 3 slices shown]
[im 1/3]
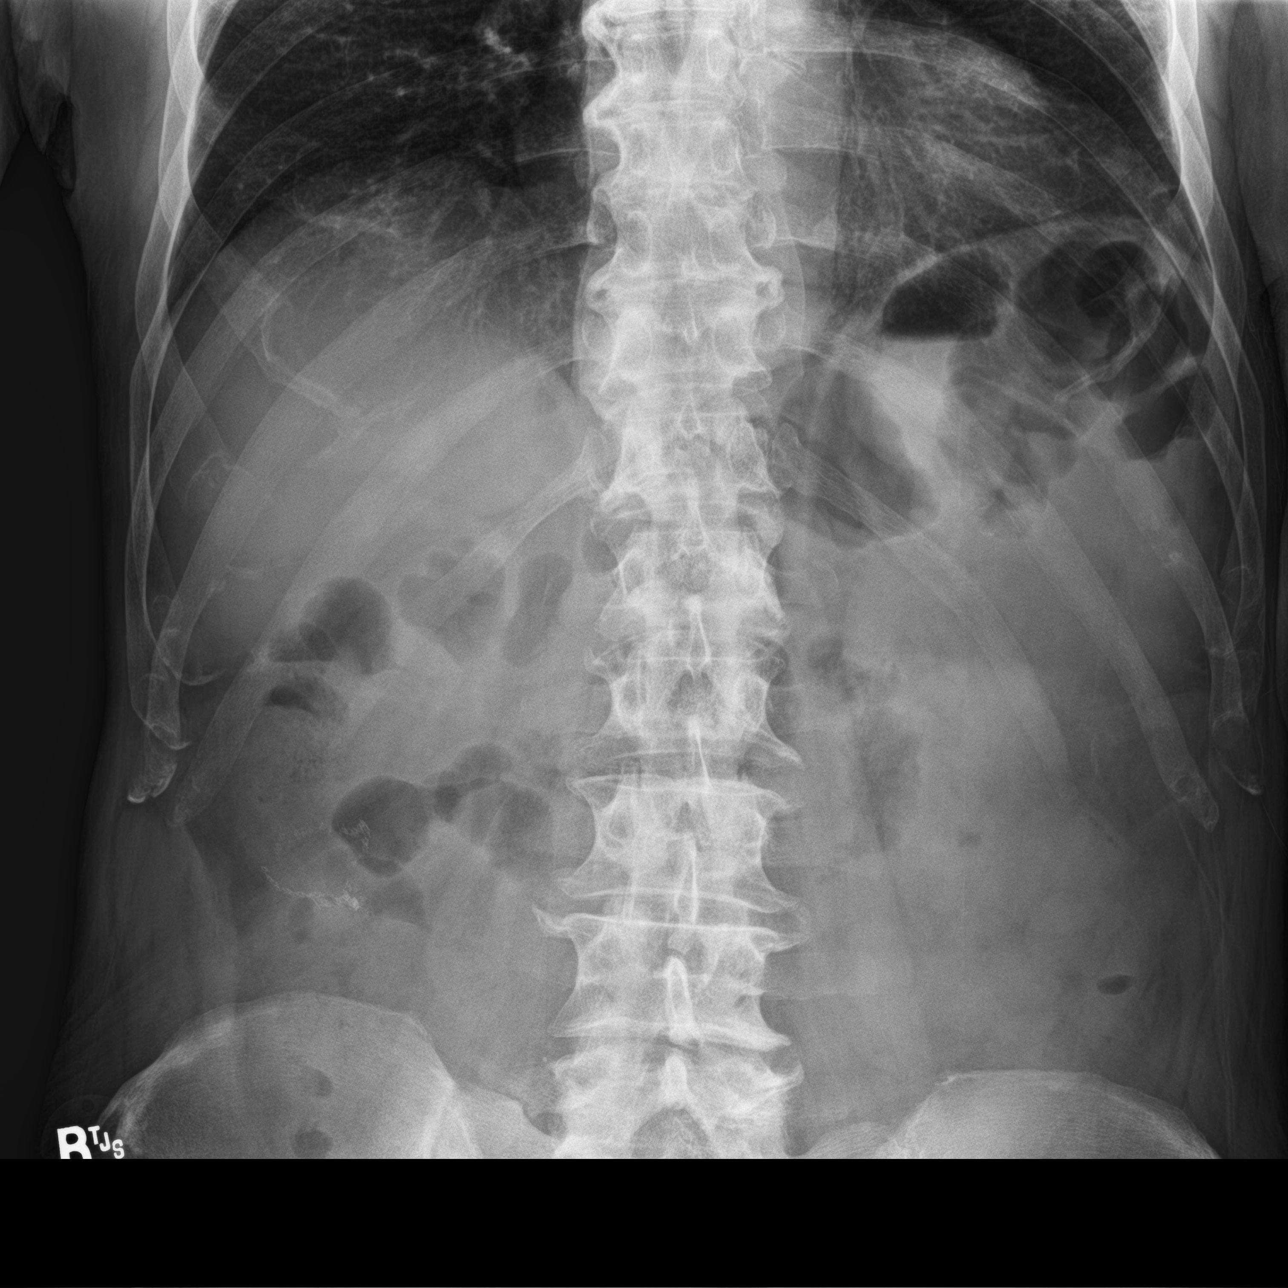
[im 2/3]
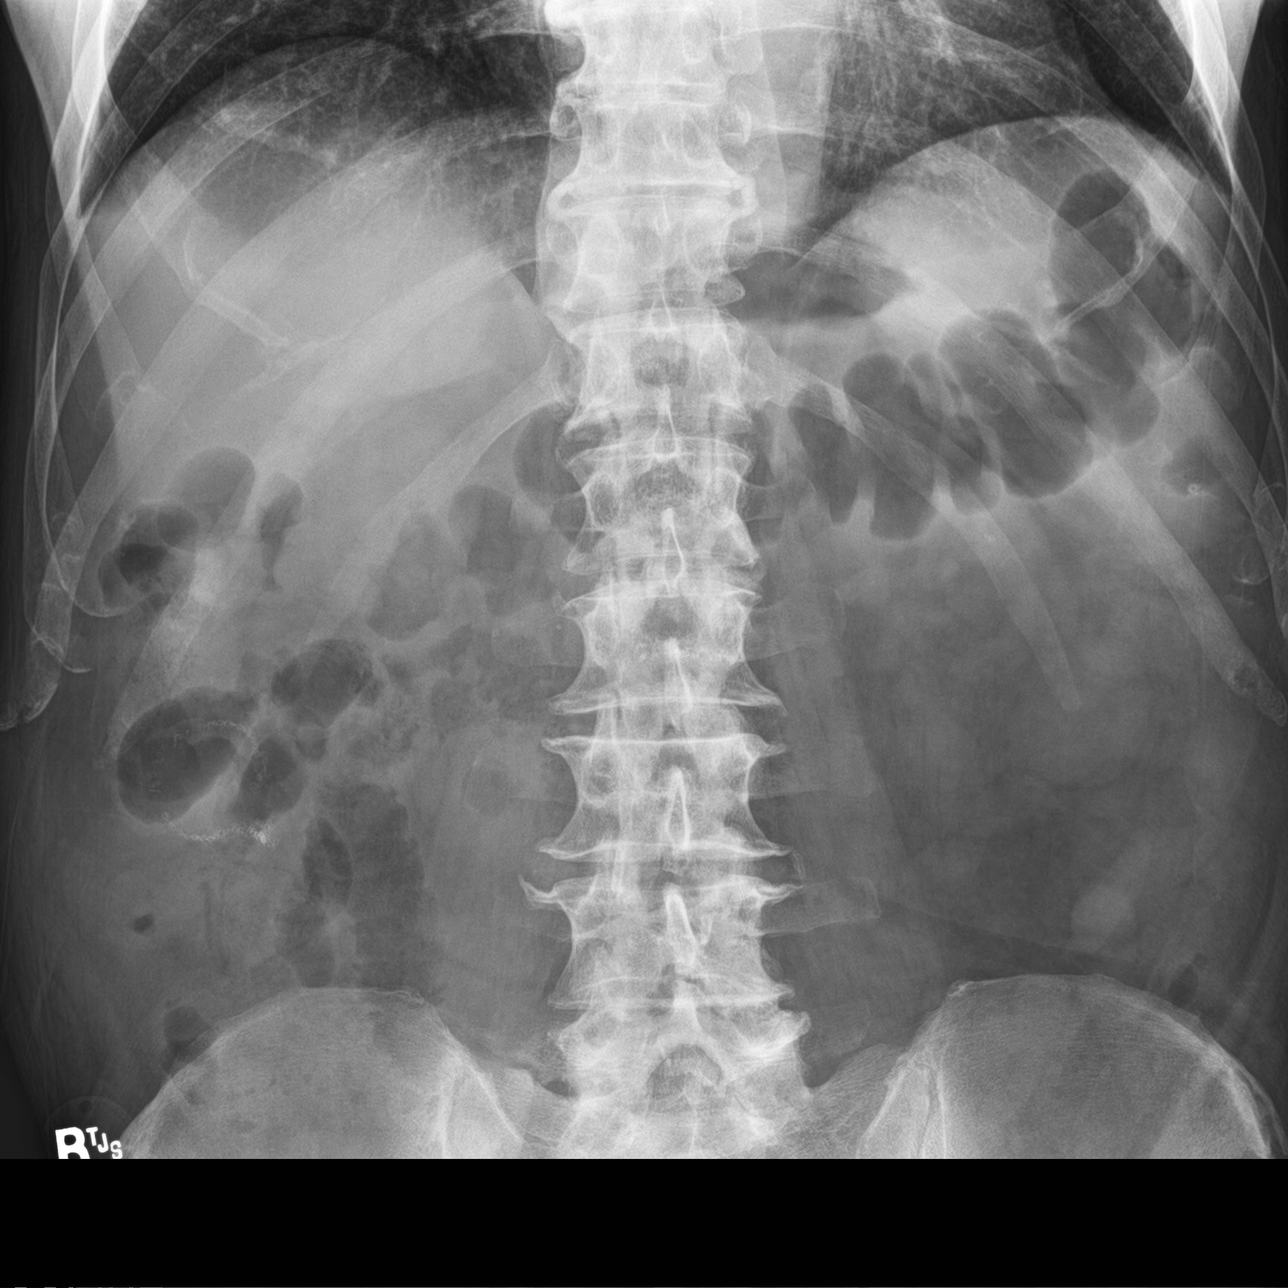
[im 3/3]
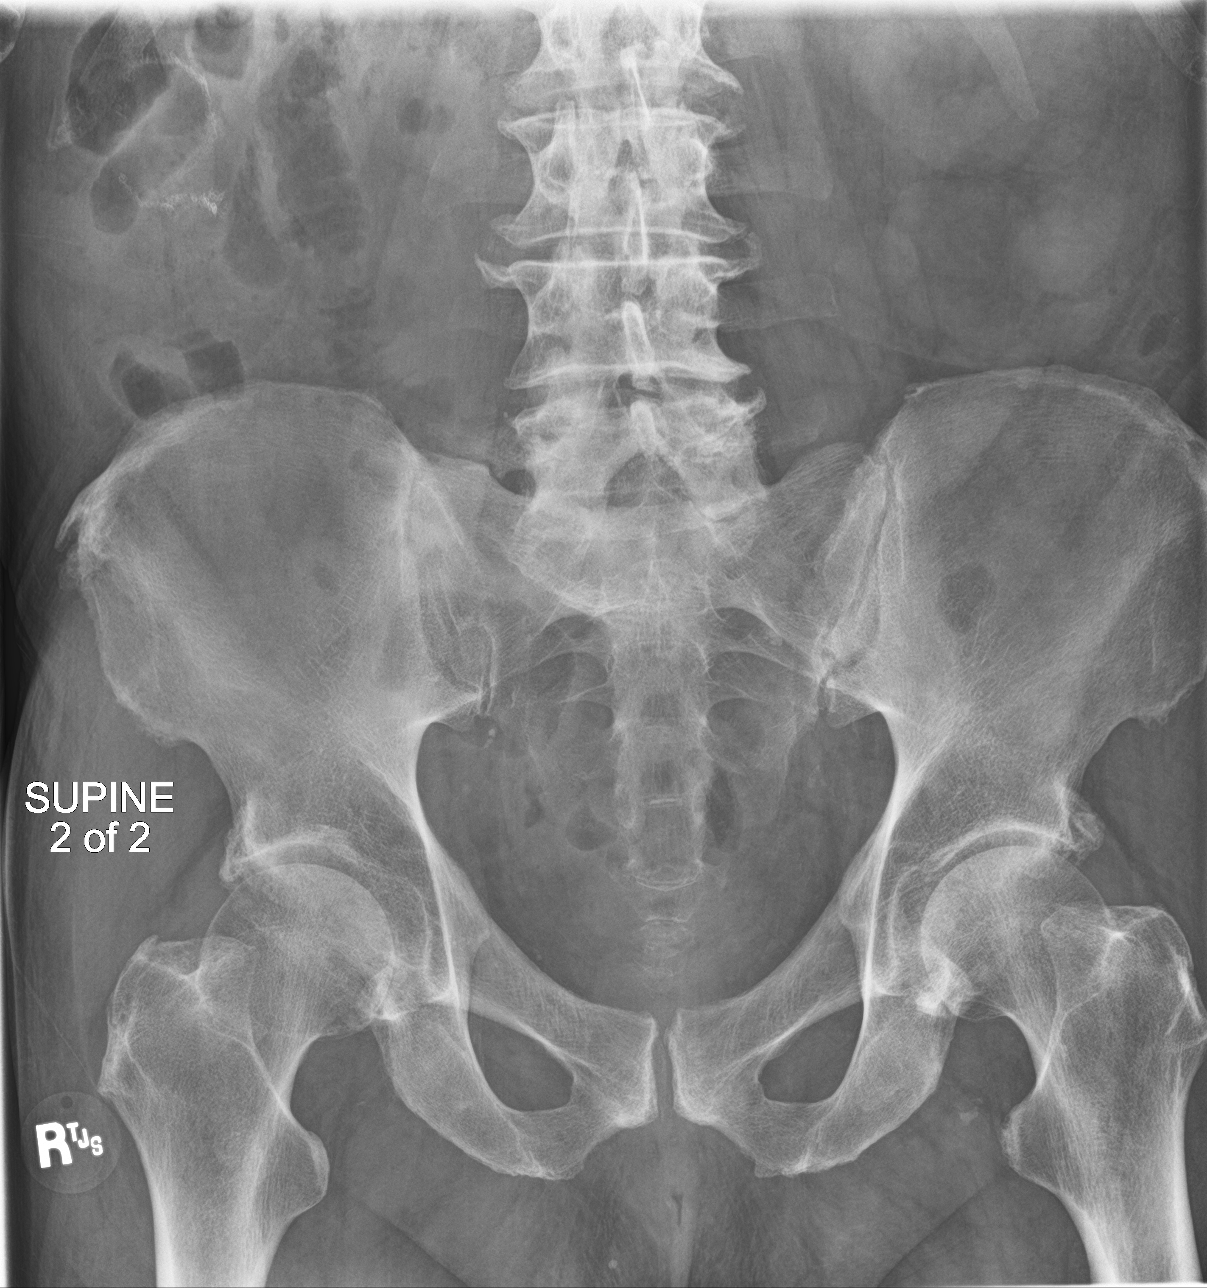

[3 of 3 positions shown; findings below may reference images not displayed]

FINDINGS: Upright and supine views. The upright view demonstrates no free
intraperitoneal air or significant air-fluid levels. Supine views
demonstrate no gaseous distention of bowel loops. Surgical sutures
in the right side of the abdomen. No abnormal abdominal
calcifications. No appendicolith.
IMPRESSION: No acute findings.

## 2019-04-20 DIAGNOSIS — N401 Enlarged prostate with lower urinary tract symptoms: Secondary | ICD-10-CM | POA: Diagnosis not present

## 2019-04-20 DIAGNOSIS — E119 Type 2 diabetes mellitus without complications: Secondary | ICD-10-CM | POA: Diagnosis not present

## 2019-04-20 DIAGNOSIS — E785 Hyperlipidemia, unspecified: Secondary | ICD-10-CM | POA: Diagnosis not present

## 2019-04-20 DIAGNOSIS — N200 Calculus of kidney: Secondary | ICD-10-CM | POA: Diagnosis not present

## 2019-04-20 DIAGNOSIS — Z48815 Encounter for surgical aftercare following surgery on the digestive system: Secondary | ICD-10-CM | POA: Diagnosis not present

## 2019-04-20 DIAGNOSIS — M1711 Unilateral primary osteoarthritis, right knee: Secondary | ICD-10-CM | POA: Diagnosis not present

## 2019-04-20 DIAGNOSIS — F17210 Nicotine dependence, cigarettes, uncomplicated: Secondary | ICD-10-CM | POA: Diagnosis not present

## 2019-04-20 DIAGNOSIS — I10 Essential (primary) hypertension: Secondary | ICD-10-CM | POA: Diagnosis not present

## 2019-04-20 DIAGNOSIS — R32 Unspecified urinary incontinence: Secondary | ICD-10-CM | POA: Diagnosis not present

## 2019-06-07 DIAGNOSIS — C182 Malignant neoplasm of ascending colon: Secondary | ICD-10-CM | POA: Diagnosis not present

## 2019-06-07 DIAGNOSIS — C189 Malignant neoplasm of colon, unspecified: Secondary | ICD-10-CM | POA: Diagnosis present

## 2019-06-22 DIAGNOSIS — C182 Malignant neoplasm of ascending colon: Secondary | ICD-10-CM | POA: Diagnosis not present

## 2019-06-22 DIAGNOSIS — C787 Secondary malignant neoplasm of liver and intrahepatic bile duct: Secondary | ICD-10-CM | POA: Diagnosis not present

## 2019-06-22 DIAGNOSIS — R918 Other nonspecific abnormal finding of lung field: Secondary | ICD-10-CM | POA: Diagnosis not present

## 2019-06-22 DIAGNOSIS — Z9049 Acquired absence of other specified parts of digestive tract: Secondary | ICD-10-CM | POA: Diagnosis not present

## 2019-06-30 NOTE — Progress Notes (Signed)
Received call from patient requesting to be transferred to [facility] at [facility] for care.  He had surgery For metastaic colon cancer at [facility] per patient and his step-daughter Oscar [name].  Two messages left for Dr. Ellison Hughs, patient's primary physician of patient request for transfer.  Patient states he will go buy office today to speak to  Dr. Ellison Hughs.   Notifies Mariea Clonts GI navigator of possible transfer of care.

## 2019-07-04 ENCOUNTER — Other Ambulatory Visit: Payer: Self-pay

## 2019-07-05 ENCOUNTER — Inpatient Hospital Stay: Payer: Medicare HMO

## 2019-07-05 ENCOUNTER — Inpatient Hospital Stay: Payer: Medicare HMO | Attending: Internal Medicine | Admitting: Internal Medicine

## 2019-07-05 ENCOUNTER — Encounter (INDEPENDENT_AMBULATORY_CARE_PROVIDER_SITE_OTHER): Payer: Self-pay

## 2019-07-05 ENCOUNTER — Encounter: Payer: Self-pay | Admitting: Internal Medicine

## 2019-07-05 ENCOUNTER — Other Ambulatory Visit: Payer: Self-pay

## 2019-07-05 DIAGNOSIS — C182 Malignant neoplasm of ascending colon: Secondary | ICD-10-CM | POA: Diagnosis not present

## 2019-07-05 DIAGNOSIS — C787 Secondary malignant neoplasm of liver and intrahepatic bile duct: Secondary | ICD-10-CM | POA: Diagnosis not present

## 2019-07-05 DIAGNOSIS — Z720 Tobacco use: Secondary | ICD-10-CM

## 2019-07-05 LAB — COMPREHENSIVE METABOLIC PANEL
ALT: 15 U/L (ref 0–44)
AST: 18 U/L (ref 15–41)
Albumin: 3.9 g/dL (ref 3.5–5.0)
Alkaline Phosphatase: 95 U/L (ref 38–126)
Anion gap: 5 (ref 5–15)
BUN: 11 mg/dL (ref 8–23)
CO2: 26 mmol/L (ref 22–32)
Calcium: 9 mg/dL (ref 8.9–10.3)
Chloride: 108 mmol/L (ref 98–111)
Creatinine, Ser: 0.61 mg/dL (ref 0.61–1.24)
GFR calc Af Amer: >60 mL/min (ref >60)
GFR calc non Af Amer: >60 mL/min (ref >60)
Glucose, Bld: 112 mg/dL — ABNORMAL HIGH (ref 70–99)
Potassium: 4 mmol/L (ref 3.5–5.1)
Sodium: 139 mmol/L (ref 135–145)
Total Bilirubin: 0.4 mg/dL (ref 0.3–1.2)
Total Protein: 7.6 g/dL (ref 6.5–8.1)

## 2019-07-05 LAB — CBC WITH DIFFERENTIAL/PLATELET
Abs Immature Granulocytes: 0.02 10*3/uL (ref 0.00–0.07)
Basophils Absolute: 0 10*3/uL (ref 0.0–0.1)
Basophils Relative: 1 %
Eosinophils Absolute: 0.8 10*3/uL — ABNORMAL HIGH (ref 0.0–0.5)
Eosinophils Relative: 12 %
HCT: 38.7 % — ABNORMAL LOW (ref 39.0–52.0)
Hemoglobin: 12.7 g/dL — ABNORMAL LOW (ref 13.0–17.0)
Immature Granulocytes: 0 %
Lymphocytes Relative: 27 %
Lymphs Abs: 1.9 10*3/uL (ref 0.7–4.0)
MCH: 31 pg (ref 26.0–34.0)
MCHC: 32.8 g/dL (ref 30.0–36.0)
MCV: 94.4 fL (ref 80.0–100.0)
Monocytes Absolute: 0.7 10*3/uL (ref 0.1–1.0)
Monocytes Relative: 10 %
Neutro Abs: 3.6 10*3/uL (ref 1.7–7.7)
Neutrophils Relative %: 50 %
Platelets: 253 10*3/uL (ref 150–400)
RBC: 4.1 MIL/uL — ABNORMAL LOW (ref 4.22–5.81)
RDW: 12.3 % (ref 11.5–15.5)
WBC: 7 10*3/uL (ref 4.0–10.5)
nRBC: 0 % (ref 0.0–0.2)

## 2019-07-05 NOTE — Progress Notes (Signed)
[facility] CONSULT NOTE  Patient Care Team: Sofie Hartigan, MD as PCP - General (Family Medicine)  CHIEF COMPLAINTS/PURPOSE OF CONSULTATION: Colon cancer  #  Oncology History Overview Note  # MAY 2020- 3. 03/09/19 Liver biopsy. Microscopic examination shows malignant cells with glandular architecture consistent with adenocarcinoma. The malignant cells are positive for CK20 and CDX-2. These findings support the clinical impression of metastatic colon adenocarcinoma. 4. 03/10/19 R hemicolectomy. Tumor site cecum. Adenocarcinoma. Mucinous features present. G2. No tumor deposits. Invades visceral peritoneum. No tumor perforation. LVI present. PNI not identified. All margins uninvolved. 1/12 LNs. PT4apN1. Periappendiceal inflammation c/w resolving abscess. Microsatellite stable (MSS). [Dr.Mettu; [facility]]  # SEP 4th 2020 [compared to May 2020]  Interval increase in size of the metastases to the hepatic dome, The metastasis to the left hepatic lobe is unchanged; 2.  New subcentimeter hypoattenuating lesion in the inferior right hepatic lobe, incompletely characterized on CT. 3.  Postsurgical changes following right hemicolectomy. # NGS-Pending  DIAGNOSIS: COLON CANCER  STAGE:  IV       ;  GOALS:Palliative  CURRENT/MOST RECENT THERAPY : FOLFOX    Cancer of right colon ([place])  07/05/2019 Initial Diagnosis   Cancer of right colon ([place])     HISTORY OF PRESENTING ILLNESS:  Oscar [name] 78 y.o.  male patient has been referred to [place] for further evaluation recommendations for recent diagnosis of metastatic colon cancer.  Patient in mid May 2020 was admitted to [facility] regional noted to have symptoms of fevers/worsening abdominal pain.  Patient was eventually transferred to [facility] for evaluation management of appendicitis complicated by phlegmon formation in the setting of newly discovered cecal mass concerning upper for cancer.  His imaging at that time also showed liver metastasis.   Patient underwent liver biopsy and also right hemicolectomy and discharged home in end of May 2020.  Patient was evaluated at [facility] oncology-recommended systemic palliative chemotherapy after improvement of performance status.    Patient recently had a CT scan 1 week ago-  that showed progressive liver metastases.  Patient is here to discuss further treatment options.  Today patient feels fine.  He denies any nausea vomiting.  denies abdominal pain.  Appetite is good.  No pain.  Review of Systems  Constitutional: Negative for chills, diaphoresis, fever, malaise/fatigue and weight loss.  HENT: Negative for nosebleeds and sore throat.   Eyes: Negative for double vision.  Respiratory: Negative for cough, hemoptysis, sputum production, shortness of breath and wheezing.   Cardiovascular: Negative for chest pain, palpitations, orthopnea and leg swelling.  Gastrointestinal: Negative for abdominal pain, blood in stool, constipation, diarrhea, heartburn, melena, nausea and vomiting.  Genitourinary: Negative for dysuria, frequency and urgency.  Musculoskeletal: Negative for back pain and joint pain.  Skin: Negative.  Negative for itching and rash.  Neurological: Negative for dizziness, tingling, focal weakness, weakness and headaches.  Endo/Heme/Allergies: Does not bruise/bleed easily.  Psychiatric/Behavioral: Negative for depression. The patient is not nervous/anxious and does not have insomnia.      MEDICAL HISTORY:  Past Medical History:  Diagnosis Date  . Arthritis   . Diabetes mellitus without complication ([place])   . H/O colon cancer, stage IV   . Hyperlipemia   . Hypertension     SURGICAL HISTORY: Past Surgical History:  Procedure Laterality Date  . JOINT REPLACEMENT      SOCIAL HISTORY: Social History   Socioeconomic History  . Marital status: Married    Spouse name: Not on file  . Number of  children: Not on file  . Years of education: Not on file  . Highest education level:  Not on file  Occupational History  . Not on file  Social Needs  . Financial resource strain: Not on file  . Food insecurity    Worry: Not on file    Inability: Not on file  . Transportation needs    Medical: Not on file    Non-medical: Not on file  Tobacco Use  . Smoking status: Current Every Day Smoker    Packs/day: 1.00    Types: Cigarettes  . Smokeless tobacco: Never Used  Substance and Sexual Activity  . Alcohol use: No  . Drug use: No  . Sexual activity: Not on file  Lifestyle  . Physical activity    Days per week: Not on file    Minutes per session: Not on file  . Stress: Not on file  Relationships  . Social Herbalist on phone: Not on file    Gets together: Not on file    Attends religious service: Not on file    Active member of club or organization: Not on file    Attends meetings of clubs or organizations: Not on file    Relationship status: Not on file  . Intimate partner violence    Fear of current or ex partner: Not on file    Emotionally abused: Not on file    Physically abused: Not on file    Forced sexual activity: Not on file  Other Topics Concern  . Not on file  Social History Narrative   Recruitment consultant retd; lives in [place]; smoking 3cig/day; [3/4 ppd x started at 9 years]; no alcohol. Son & daughter; wife dementia [waiting for placement].     FAMILY HISTORY: Family History  Problem Relation Age of Onset  . Peptic Ulcer Disease Father     ALLERGIES:  is allergic to ace inhibitors.  MEDICATIONS:  Current Outpatient Medications  Medication Sig Dispense Refill  . amLODipine (NORVASC) 5 MG tablet Take 5 mg by mouth daily.    . pravastatin (PRAVACHOL) 40 MG tablet Take 40 mg by mouth daily.    . tamsulosin (FLOMAX) 0.4 MG CAPS capsule Take 1 capsule by mouth daily.    . fluticasone (FLONASE) 50 MCG/ACT nasal spray Place into the nose.    . ondansetron (ZOFRAN-ODT) 4 MG disintegrating tablet Take 1 tablet (4 mg total)  by mouth every 6 (six) hours as needed for nausea. (Patient not taking: Reported on 07/05/2019) 20 tablet 0   No current facility-administered medications for this visit.       Marland Kitchen  PHYSICAL EXAMINATION: ECOG PERFORMANCE STATUS: 0 - Asymptomatic  Vitals:   07/05/19 1450 07/05/19 1459  BP: (!) 164/97 (!) 166/67  Pulse: 75 86  Resp: 18   Temp: 98 F (36.7 C)    Filed Weights   07/05/19 1450  Weight: 190 lb (86.2 kg)    Physical Exam  Constitutional: He is oriented to person, place, and time and well-developed, well-nourished, and in no distress.  HENT:  Head: Normocephalic and atraumatic.  Mouth/Throat: Oropharynx is clear and moist. No oropharyngeal exudate.  Eyes: Pupils are equal, round, and reactive to light.  Neck: Normal range of motion. Neck supple.  Cardiovascular: Normal rate and regular rhythm.  Pulmonary/Chest: No respiratory distress. He has no wheezes.  Abdominal: Soft. Bowel sounds are normal. He exhibits no distension and no mass. There is no abdominal tenderness. There is no  rebound and no guarding.  Musculoskeletal: Normal range of motion.        General: No tenderness or edema.  Neurological: He is alert and oriented to person, place, and time.  Skin: Skin is warm.  Psychiatric: Affect normal.     LABORATORY DATA:  I have reviewed the data as listed Lab Results  Component Value Date   WBC 7.0 07/05/2019   HGB 12.7 (L) 07/05/2019   HCT 38.7 (L) 07/05/2019   MCV 94.4 07/05/2019   PLT 253 07/05/2019   Recent Labs    03/04/19 1501 03/05/19 0537 03/08/19 0431 07/05/19 1605  NA 133* 136  --  139  K 4.1 3.8  --  4.0  CL 103 106  --  108  CO2 20* 21*  --  26  GLUCOSE 242* 127*  --  112*  BUN 18 16  --  11  CREATININE 0.92 0.83 0.72 0.61  CALCIUM 8.4* 8.4*  --  9.0  GFRNONAA >60 >60 >60 >60  GFRAA >60 >60 >60 >60  PROT 7.0 6.3*  --  7.6  ALBUMIN 3.2* 2.9*  --  3.9  AST 22 17  --  18  ALT 21 18  --  15  ALKPHOS 91 85  --  95  BILITOT 0.9 1.0   --  0.4    RADIOGRAPHIC STUDIES: I have personally reviewed the radiological images as listed and agreed with the findings in the report. No results found.  ASSESSMENT & PLAN:   Cancer of right colon ([place]) #Right-sided colon adenocarcinoma-with synchronous metastasis to liver.  MSI stable. Will need NGS testing.   #Discussed incurable nature of the disease.  The median survival is approximately 2 years with treatment and without treatment approximately 6 months.  I would recommend first-line FOLFOX plus Avastin therapy.  # I discussed that FOLFOX chemotherapy is given every 2 weeks; discuss the potential side effects including but not limited to nausea vomiting diarrhea, sores in the mouth, hand-foot syndrome; also tingling and numbness/cold sensitivity with oxaliplatin.  Reviewed the rationale for using Avastin. Discussed the potential side effects including but not limited to elevated blood pressure; nephrotic syndrome wound healing problems.  #Discussed regarding port placement; patient reluctant with the port placement as he is concerned about interference with his playing golf.  #Discussed option of Xeloda alternate to 5-FU. # Xeloda pills twice a day 2  weeks on 1 week off. Discussed potential side effects including but not limited to- hand-foot syndrome and diarrhea oral ulcers.  #After lengthy discussion patient states that he wanted to speak with his family before proceeding with any treatment options.  I recommend he calls [place] at the earliest to get started the treatment.  Patient declined my offer to talk to his family.  Thank you Dr.Feldpausch for allowing me to participate in the care of your pleasant patient. Please do not hesitate to contact me with questions or concerns in the interim.  Cc; Dr.Feldpausch/[place]  # DISPOSITION: # labs- today # follow up TBD- Dr.B      All questions were answered. The patient knows to call the clinic with any problems, questions or  concerns.       Cammie Sickle, MD 07/05/2019 8:09 PM

## 2019-07-05 NOTE — Assessment & Plan Note (Addendum)
#  Right-sided colon adenocarcinoma-with synchronous metastasis to liver.  MSI stable. Will need NGS testing.   #Discussed incurable nature of the disease.  The median survival is approximately 2 years with treatment and without treatment approximately 6 months.  I would recommend first-line FOLFOX plus Avastin therapy.  # I discussed that FOLFOX chemotherapy is given every 2 weeks; discuss the potential side effects including but not limited to nausea vomiting diarrhea, sores in the mouth, hand-foot syndrome; also tingling and numbness/cold sensitivity with oxaliplatin.  Reviewed the rationale for using Avastin. Discussed the potential side effects including but not limited to elevated blood pressure; nephrotic syndrome wound healing problems.  #Discussed regarding port placement; patient reluctant with the port placement as he is concerned about interference with his playing golf.  #Discussed option of Xeloda alternate to 5-FU. # Xeloda pills twice a day 2  weeks on 1 week off. Discussed potential side effects including but not limited to- hand-foot syndrome and diarrhea oral ulcers.  #After lengthy discussion patient states that he wanted to speak with his family before proceeding with any treatment options.  I recommend he calls [place] at the earliest to get started the treatment.  Patient declined my offer to talk to his family.  Thank you Dr.Feldpausch for allowing me to participate in the care of your pleasant patient. Please do not hesitate to contact me with questions or concerns in the interim.  Cc; Dr.Feldpausch/[place]  # DISPOSITION: # labs- today # follow up TBD- Dr.B

## 2019-07-06 LAB — CEA: CEA: 17.5 ng/mL — ABNORMAL HIGH (ref 0.0–4.7)

## 2019-07-13 ENCOUNTER — Telehealth: Payer: Self-pay | Admitting: Internal Medicine

## 2019-07-13 ENCOUNTER — Other Ambulatory Visit: Payer: Self-pay | Admitting: Internal Medicine

## 2019-07-13 DIAGNOSIS — Z7189 Other specified counseling: Secondary | ICD-10-CM | POA: Insufficient documentation

## 2019-07-13 DIAGNOSIS — C182 Malignant neoplasm of ascending colon: Secondary | ICD-10-CM

## 2019-07-13 MED ORDER — LIDOCAINE-PRILOCAINE 2.5-2.5 % EX CREA: 1.0000 "application " | TOPICAL_CREAM | CUTANEOUS | 0 refills | Status: DC | PRN

## 2019-07-13 MED ORDER — PROCHLORPERAZINE MALEATE 10 MG PO TABS: 10.0000 mg | ORAL_TABLET | Freq: Four times a day (QID) | ORAL | 1 refills | Status: DC | PRN

## 2019-07-13 NOTE — Telephone Encounter (Signed)
Spoke to patient; interested in treatment/proceeding with chemotherapy.  #Please make a referral for vascular/port placement ASAP  #Chemotherapy education-FOLFOX plus Avastin  # [id number] testing.   # Follow-up with MD-October 5th-labs CBC CMP CEA; UA; FOLFOX plus Avastin.    # Drue Dun- please order [facility] on his colon cancer specimen/surgery at [facility].

## 2019-07-13 NOTE — Progress Notes (Signed)
Sent emla cream/ ant-emetics.

## 2019-07-13 NOTE — Progress Notes (Signed)
START ON PATHWAY REGIMEN - Colorectal     A cycle is every 14 days:     Bevacizumab-xxxx      Oxaliplatin      Leucovorin      Fluorouracil      Fluorouracil   **Always confirm dose/schedule in your pharmacy ordering system**  Patient Characteristics: Distant Metastases, Nonsurgical Candidate, KRAS/NRAS Mutation Positive/Unknown (BRAF V600 Wild-Type/Unknown), Standard Cytotoxic Therapy, First Line Standard Cytotoxic Therapy, Bevacizumab Eligible, PS = 0,1 Tumor Location: Colon Therapeutic Status: Distant Metastases Microsatellite/Mismatch Repair Status: MSS/pMMR BRAF Mutation Status: Awaiting Test Results KRAS/NRAS Mutation Status: Awaiting Test Results Standard Cytotoxic Line of Therapy: First Line Standard Cytotoxic Therapy ECOG Performance Status: 0 Bevacizumab Eligibility: Eligible Intent of Therapy: Non-Curative / Palliative Intent, Discussed with Patient

## 2019-07-14 ENCOUNTER — Other Ambulatory Visit: Payer: Self-pay

## 2019-07-14 DIAGNOSIS — Z95828 Presence of other vascular implants and grafts: Secondary | ICD-10-CM

## 2019-07-14 NOTE — Telephone Encounter (Signed)
IR referral placed and lab orders entered.

## 2019-07-14 NOTE — Addendum Note (Signed)
Addended by: Sandria Bales B on: 07/14/2019 03:53 PM   Modules accepted: Orders

## 2019-07-14 NOTE — Progress Notes (Signed)
[facility] CDx and PD-L1  electronic requisition sent for [id number], cecum, collected 03/10/19.

## 2019-07-17 NOTE — Patient Instructions (Signed)
Bevacizumab injection What is this medicine? BEVACIZUMAB (be va SIZ yoo mab) is a monoclonal antibody. It is used to treat many types of cancer. This medicine may be used for other purposes; ask your health care provider or pharmacist if you have questions. COMMON BRAND NAME(S): Avastin, MVASI, Zirabev What should I tell my health care provider before I take this medicine? They need to know if you have any of these conditions:  diabetes  heart disease  high blood pressure  history of coughing up blood  prior anthracycline chemotherapy (e.g., doxorubicin, daunorubicin, epirubicin)  recent or ongoing radiation therapy  recent or planning to have surgery  stroke  an unusual or allergic reaction to bevacizumab, hamster proteins, mouse proteins, other medicines, foods, dyes, or preservatives  pregnant or trying to get pregnant  breast-feeding How should I use this medicine? This medicine is for infusion into a vein. It is given by a health care professional in a hospital or clinic setting. Talk to your pediatrician regarding the use of this medicine in children. Special care may be needed. Overdosage: If you think you have taken too much of this medicine contact a poison control center or emergency room at once. NOTE: This medicine is only for you. Do not share this medicine with others. What if I miss a dose? It is important not to miss your dose. Call your doctor or health care professional if you are unable to keep an appointment. What may interact with this medicine? Interactions are not expected. This list may not describe all possible interactions. Give your health care provider a list of all the medicines, herbs, non-prescription drugs, or dietary supplements you use. Also tell them if you smoke, drink alcohol, or use illegal drugs. Some items may interact with your medicine. What should I watch for while using this medicine? Your condition will be monitored carefully while  you are receiving this medicine. You will need important blood work and urine testing done while you are taking this medicine. This medicine may increase your risk to bruise or bleed. Call your doctor or health care professional if you notice any unusual bleeding. This medicine should be started at least 28 days following major surgery and the site of the surgery should be totally healed. Check with your doctor before scheduling dental work or surgery while you are receiving this treatment. Talk to your doctor if you have recently had surgery or if you have a wound that has not healed. Do not become pregnant while taking this medicine or for 6 months after stopping it. Women should inform their doctor if they wish to become pregnant or think they might be pregnant. There is a potential for serious side effects to an unborn child. Talk to your health care professional or pharmacist for more information. Do not breast-feed an infant while taking this medicine and for 6 months after the last dose. This medicine has caused ovarian failure in some women. This medicine may interfere with the ability to have a child. You should talk to your doctor or health care professional if you are concerned about your fertility. What side effects may I notice from receiving this medicine? Side effects that you should report to your doctor or health care professional as soon as possible:  allergic reactions like skin rash, itching or hives, swelling of the face, lips, or tongue  chest pain or chest tightness  chills  coughing up blood  high fever  seizures  severe constipation  signs and symptoms   and symptoms of bleeding such as bloody or black, tarry stools; red or dark-brown urine; spitting up blood or brown material that looks like coffee grounds; red spots on the skin; unusual bruising or bleeding from the eye, gums, or nose  signs and symptoms of a blood clot such as breathing problems; chest pain; severe, sudden  headache; pain, swelling, warmth in the leg  signs and symptoms of a stroke like changes in vision; confusion; trouble speaking or understanding; severe headaches; sudden numbness or weakness of the face, arm or leg; trouble walking; dizziness; loss of balance or coordination  stomach pain  sweating  swelling of legs or ankles  vomiting  weight gain Side effects that usually do not require medical attention (report to your doctor or health care professional if they continue or are bothersome):  back pain  changes in taste  decreased appetite  dry skin  nausea  tiredness This list may not describe all possible side effects. Call your doctor for medical advice about side effects. You may report side effects to FDA at 1-800-FDA-1088. Where should I keep my medicine? This drug is given in a hospital or clinic and will not be stored at home. NOTE: This sheet is a summary. It may not cover all possible information. If you have questions about this medicine, talk to your doctor, pharmacist, or health care provider.  2020 Elsevier/Gold Standard (2016-10-02 14:33:29) Oxaliplatin Injection What is this medicine? OXALIPLATIN (ox AL i PLA tin) is a chemotherapy drug. It targets fast dividing cells, like cancer cells, and causes these cells to die. This medicine is used to treat cancers of the colon and rectum, and many other cancers. This medicine may be used for other purposes; ask your health care provider or pharmacist if you have questions. COMMON BRAND NAME(S): Eloxatin What should I tell my health care provider before I take this medicine? They need to know if you have any of these conditions:  kidney disease  an unusual or allergic reaction to oxaliplatin, other chemotherapy, other medicines, foods, dyes, or preservatives  pregnant or trying to get pregnant  breast-feeding How should I use this medicine? This drug is given as an infusion into a vein. It is administered in a  hospital or clinic by a specially trained health care professional. Talk to your pediatrician regarding the use of this medicine in children. Special care may be needed. Overdosage: If you think you have taken too much of this medicine contact a poison control center or emergency room at once. NOTE: This medicine is only for you. Do not share this medicine with others. What if I miss a dose? It is important not to miss a dose. Call your doctor or health care professional if you are unable to keep an appointment. What may interact with this medicine?  medicines to increase blood counts like filgrastim, pegfilgrastim, sargramostim  probenecid  some antibiotics like amikacin, gentamicin, neomycin, polymyxin B, streptomycin, tobramycin  zalcitabine Talk to your doctor or health care professional before taking any of these medicines:  acetaminophen  aspirin  ibuprofen  ketoprofen  naproxen This list may not describe all possible interactions. Give your health care provider a list of all the medicines, herbs, non-prescription drugs, or dietary supplements you use. Also tell them if you smoke, drink alcohol, or use illegal drugs. Some items may interact with your medicine. What should I watch for while using this medicine? Your condition will be monitored carefully while you are receiving this medicine. You will   need important blood work done while you are taking this medicine. This medicine can make you more sensitive to cold. Do not drink cold drinks or use ice. Cover exposed skin before coming in contact with cold temperatures or cold objects. When out in cold weather wear warm clothing and cover your mouth and nose to warm the air that goes into your lungs. Tell your doctor if you get sensitive to the cold. This drug may make you feel generally unwell. This is not uncommon, as chemotherapy can affect healthy cells as well as cancer cells. Report any side effects. Continue your course of  treatment even though you feel ill unless your doctor tells you to stop. In some cases, you may be given additional medicines to help with side effects. Follow all directions for their use. Call your doctor or health care professional for advice if you get a fever, chills or sore throat, or other symptoms of a cold or flu. Do not treat yourself. This drug decreases your body's ability to fight infections. Try to avoid being around people who are sick. This medicine may increase your risk to bruise or bleed. Call your doctor or health care professional if you notice any unusual bleeding. Be careful brushing and flossing your teeth or using a toothpick because you may get an infection or bleed more easily. If you have any dental work done, tell your dentist you are receiving this medicine. Avoid taking products that contain aspirin, acetaminophen, ibuprofen, naproxen, or ketoprofen unless instructed by your doctor. These medicines may hide a fever. Do not become pregnant while taking this medicine. Women should inform their doctor if they wish to become pregnant or think they might be pregnant. There is a potential for serious side effects to an unborn child. Talk to your health care professional or pharmacist for more information. Do not breast-feed an infant while taking this medicine. Call your doctor or health care professional if you get diarrhea. Do not treat yourself. What side effects may I notice from receiving this medicine? Side effects that you should report to your doctor or health care professional as soon as possible:  allergic reactions like skin rash, itching or hives, swelling of the face, lips, or tongue  low blood counts - This drug may decrease the number of white blood cells, red blood cells and platelets. You may be at increased risk for infections and bleeding.  signs of infection - fever or chills, cough, sore throat, pain or difficulty passing urine  signs of decreased  platelets or bleeding - bruising, pinpoint red spots on the skin, black, tarry stools, nosebleeds  signs of decreased red blood cells - unusually weak or tired, fainting spells, lightheadedness  breathing problems  chest pain, pressure  cough  diarrhea  jaw tightness  mouth sores  nausea and vomiting  pain, swelling, redness or irritation at the injection site  pain, tingling, numbness in the hands or feet  problems with balance, talking, walking  redness, blistering, peeling or loosening of the skin, including inside the mouth  trouble passing urine or change in the amount of urine Side effects that usually do not require medical attention (report to your doctor or health care professional if they continue or are bothersome):  changes in vision  constipation  hair loss  loss of appetite  metallic taste in the mouth or changes in taste  stomach pain This list may not describe all possible side effects. Call your doctor for medical advice about side   effects. You may report side effects to FDA at 1-800-FDA-1088. Where should I keep my medicine? This drug is given in a hospital or clinic and will not be stored at home. NOTE: This sheet is a summary. It may not cover all possible information. If you have questions about this medicine, talk to your doctor, pharmacist, or health care provider.  2020 Elsevier/Gold Standard (2008-05-01 17:22:47) Fluorouracil, 5-FU injection What is this medicine? FLUOROURACIL, 5-FU (flure oh YOOR a sil) is a chemotherapy drug. It slows the growth of cancer cells. This medicine is used to treat many types of cancer like breast cancer, colon or rectal cancer, pancreatic cancer, and stomach cancer. This medicine may be used for other purposes; ask your health care provider or pharmacist if you have questions. COMMON BRAND NAME(S): Adrucil What should I tell my health care provider before I take this medicine? They need to know if you have any  of these conditions:  blood disorders  dihydropyrimidine dehydrogenase (DPD) deficiency  infection (especially a virus infection such as chickenpox, cold sores, or herpes)  kidney disease  liver disease  malnourished, poor nutrition  recent or ongoing radiation therapy  an unusual or allergic reaction to fluorouracil, other chemotherapy, other medicines, foods, dyes, or preservatives  pregnant or trying to get pregnant  breast-feeding How should I use this medicine? This drug is given as an infusion or injection into a vein. It is administered in a hospital or clinic by a specially trained health care professional. Talk to your pediatrician regarding the use of this medicine in children. Special care may be needed. Overdosage: If you think you have taken too much of this medicine contact a poison control center or emergency room at once. NOTE: This medicine is only for you. Do not share this medicine with others. What if I miss a dose? It is important not to miss your dose. Call your doctor or health care professional if you are unable to keep an appointment. What may interact with this medicine?  allopurinol  cimetidine  dapsone  digoxin  hydroxyurea  leucovorin  levamisole  medicines for seizures like ethotoin, fosphenytoin, phenytoin  medicines to increase blood counts like filgrastim, pegfilgrastim, sargramostim  medicines that treat or prevent blood clots like warfarin, enoxaparin, and dalteparin  methotrexate  metronidazole  pyrimethamine  some other chemotherapy drugs like busulfan, cisplatin, estramustine, vinblastine  trimethoprim  trimetrexate  vaccines Talk to your doctor or health care professional before taking any of these medicines:  acetaminophen  aspirin  ibuprofen  ketoprofen  naproxen This list may not describe all possible interactions. Give your health care provider a list of all the medicines, herbs, non-prescription  drugs, or dietary supplements you use. Also tell them if you smoke, drink alcohol, or use illegal drugs. Some items may interact with your medicine. What should I watch for while using this medicine? Visit your doctor for checks on your progress. This drug may make you feel generally unwell. This is not uncommon, as chemotherapy can affect healthy cells as well as cancer cells. Report any side effects. Continue your course of treatment even though you feel ill unless your doctor tells you to stop. In some cases, you may be given additional medicines to help with side effects. Follow all directions for their use. Call your doctor or health care professional for advice if you get a fever, chills or sore throat, or other symptoms of a cold or flu. Do not treat yourself. This drug decreases your body's ability   to fight infections. Try to avoid being around people who are sick. This medicine may increase your risk to bruise or bleed. Call your doctor or health care professional if you notice any unusual bleeding. Be careful brushing and flossing your teeth or using a toothpick because you may get an infection or bleed more easily. If you have any dental work done, tell your dentist you are receiving this medicine. Avoid taking products that contain aspirin, acetaminophen, ibuprofen, naproxen, or ketoprofen unless instructed by your doctor. These medicines may hide a fever. Do not become pregnant while taking this medicine. Women should inform their doctor if they wish to become pregnant or think they might be pregnant. There is a potential for serious side effects to an unborn child. Talk to your health care professional or pharmacist for more information. Do not breast-feed an infant while taking this medicine. Men should inform their doctor if they wish to father a child. This medicine may lower sperm counts. Do not treat diarrhea with over the counter products. Contact your doctor if you have diarrhea that  lasts more than 2 days or if it is severe and watery. This medicine can make you more sensitive to the sun. Keep out of the sun. If you cannot avoid being in the sun, wear protective clothing and use sunscreen. Do not use sun lamps or tanning beds/booths. What side effects may I notice from receiving this medicine? Side effects that you should report to your doctor or health care professional as soon as possible:  allergic reactions like skin rash, itching or hives, swelling of the face, lips, or tongue  low blood counts - this medicine may decrease the number of white blood cells, red blood cells and platelets. You may be at increased risk for infections and bleeding.  signs of infection - fever or chills, cough, sore throat, pain or difficulty passing urine  signs of decreased platelets or bleeding - bruising, pinpoint red spots on the skin, black, tarry stools, blood in the urine  signs of decreased red blood cells - unusually weak or tired, fainting spells, lightheadedness  breathing problems  changes in vision  chest pain  mouth sores  nausea and vomiting  pain, swelling, redness at site where injected  pain, tingling, numbness in the hands or feet  redness, swelling, or sores on hands or feet  stomach pain  unusual bleeding Side effects that usually do not require medical attention (report to your doctor or health care professional if they continue or are bothersome):  changes in finger or toe nails  diarrhea  dry or itchy skin  hair loss  headache  loss of appetite  sensitivity of eyes to the light  stomach upset  unusually teary eyes This list may not describe all possible side effects. Call your doctor for medical advice about side effects. You may report side effects to FDA at 1-800-FDA-1088. Where should I keep my medicine? This drug is given in a hospital or clinic and will not be stored at home. NOTE: This sheet is a summary. It may not cover all  possible information. If you have questions about this medicine, talk to your doctor, pharmacist, or health care provider.  2020 Elsevier/Gold Standard (2008-02-08 13:53:16) Leucovorin injection What is this medicine? LEUCOVORIN (loo koe VOR in) is used to prevent or treat the harmful effects of some medicines. This medicine is used to treat anemia caused by a low amount of folic acid in the body. It is also used   with 5-fluorouracil (5-FU) to treat colon cancer. This medicine may be used for other purposes; ask your health care provider or pharmacist if you have questions. What should I tell my health care provider before I take this medicine? They need to know if you have any of these conditions:  anemia from low levels of vitamin B-12 in the blood  an unusual or allergic reaction to leucovorin, folic acid, other medicines, foods, dyes, or preservatives  pregnant or trying to get pregnant  breast-feeding How should I use this medicine? This medicine is for injection into a muscle or into a vein. It is given by a health care professional in a hospital or clinic setting. Talk to your pediatrician regarding the use of this medicine in children. Special care may be needed. Overdosage: If you think you have taken too much of this medicine contact a poison control center or emergency room at once. NOTE: This medicine is only for you. Do not share this medicine with others. What if I miss a dose? This does not apply. What may interact with this medicine?  capecitabine  fluorouracil  phenobarbital  phenytoin  primidone  trimethoprim-sulfamethoxazole This list may not describe all possible interactions. Give your health care provider a list of all the medicines, herbs, non-prescription drugs, or dietary supplements you use. Also tell them if you smoke, drink alcohol, or use illegal drugs. Some items may interact with your medicine. What should I watch for while using this  medicine? Your condition will be monitored carefully while you are receiving this medicine. This medicine may increase the side effects of 5-fluorouracil, 5-FU. Tell your doctor or health care professional if you have diarrhea or mouth sores that do not get better or that get worse. What side effects may I notice from receiving this medicine? Side effects that you should report to your doctor or health care professional as soon as possible:  allergic reactions like skin rash, itching or hives, swelling of the face, lips, or tongue  breathing problems  fever, infection  mouth sores  unusual bleeding or bruising  unusually weak or tired Side effects that usually do not require medical attention (report to your doctor or health care professional if they continue or are bothersome):  constipation or diarrhea  loss of appetite  nausea, vomiting This list may not describe all possible side effects. Call your doctor for medical advice about side effects. You may report side effects to FDA at 1-800-FDA-1088. Where should I keep my medicine? This drug is given in a hospital or clinic and will not be stored at home. NOTE: This sheet is a summary. It may not cover all possible information. If you have questions about this medicine, talk to your doctor, pharmacist, or health care provider.  2020 Elsevier/Gold Standard (2008-04-10 16:50:29)  

## 2019-07-18 ENCOUNTER — Inpatient Hospital Stay: Payer: Medicare HMO

## 2019-07-18 ENCOUNTER — Other Ambulatory Visit: Payer: Self-pay

## 2019-07-18 ENCOUNTER — Other Ambulatory Visit: Payer: Self-pay | Admitting: *Deleted

## 2019-07-18 ENCOUNTER — Inpatient Hospital Stay (HOSPITAL_BASED_OUTPATIENT_CLINIC_OR_DEPARTMENT_OTHER): Payer: Medicare HMO | Admitting: Nurse Practitioner

## 2019-07-18 ENCOUNTER — Other Ambulatory Visit
Admission: RE | Admit: 2019-07-18 | Discharge: 2019-07-18 | Disposition: A | Discharge status: Home / Self Care | Payer: Medicare HMO | Source: Ambulatory Visit | Attending: Internal Medicine | Admitting: Internal Medicine

## 2019-07-18 ENCOUNTER — Other Ambulatory Visit: Payer: Medicare HMO

## 2019-07-18 DIAGNOSIS — Z720 Tobacco use: Secondary | ICD-10-CM | POA: Diagnosis not present

## 2019-07-18 DIAGNOSIS — C787 Secondary malignant neoplasm of liver and intrahepatic bile duct: Secondary | ICD-10-CM | POA: Diagnosis not present

## 2019-07-18 DIAGNOSIS — Z01812 Encounter for preprocedural laboratory examination: Secondary | ICD-10-CM | POA: Insufficient documentation

## 2019-07-18 DIAGNOSIS — C182 Malignant neoplasm of ascending colon: Secondary | ICD-10-CM

## 2019-07-18 DIAGNOSIS — Z20828 Contact with and (suspected) exposure to other viral communicable diseases: Secondary | ICD-10-CM | POA: Diagnosis not present

## 2019-07-18 NOTE — Addendum Note (Signed)
Addended by: Sabino Gasser on: 07/18/2019 08:56 AM   Modules accepted: Orders

## 2019-07-18 NOTE — Telephone Encounter (Signed)
Reviewed chart. Port not yet scheduled. RN faxed specialty sch. Worksheet to IR. Will f/u to get port sch asap.

## 2019-07-18 NOTE — Telephone Encounter (Signed)
Spoke with patient. He will have covid testing today. Port placement arranged by IR on Thursday this week. Arrival time at 10:30 am. Patient and patient's daughter given instructions for the port placement - time/date and NPO 6 hrs prior to placement. He will bring a driver. Pt and daughter gave verbal understanding.

## 2019-07-18 NOTE — Progress Notes (Signed)
[facility]  Telephone:([phone] [phone]  Patient Care Team: Sofie Hartigan, MD as PCP - General (Family Medicine)   Name of the patient: Oscar [name]  [id number]  05-28-1941   Date of visit: 07/18/19  Diagnosis-colon cancer  Chief complaint/Reason for visit- Initial Meeting for [facility], preparing for starting chemotherapy  Heme/Onc history:  Oncology History Overview Note  # MAY 2020- 3. 03/09/19 Liver biopsy. Microscopic examination shows malignant cells with glandular architecture consistent with adenocarcinoma. The malignant cells are positive for CK20 and CDX-2. These findings support the clinical impression of metastatic colon adenocarcinoma. 4. 03/10/19 R hemicolectomy. Tumor site cecum. Adenocarcinoma. Mucinous features present. G2. No tumor deposits. Invades visceral peritoneum. No tumor perforation. LVI present. PNI not identified. All margins uninvolved. 1/12 LNs. PT4apN1. Periappendiceal inflammation c/w resolving abscess. Microsatellite stable (MSS). [Dr.Mettu; [facility]]  # SEP 4th 2020 [compared to May 2020]  Interval increase in size of the metastases to the hepatic dome, The metastasis to the left hepatic lobe is unchanged; 2.  New subcentimeter hypoattenuating lesion in the inferior right hepatic lobe, incompletely characterized on CT. 3.  Postsurgical changes following right hemicolectomy. # NGS-Pending  DIAGNOSIS: COLON CANCER  STAGE:  IV       ;  GOALS:Palliative  CURRENT/MOST RECENT THERAPY : FOLFOX    Cancer of right colon ([place])  07/05/2019 Initial Diagnosis   Cancer of right colon ([place])   07/13/2019 -  Chemotherapy   The patient had PALONOSETRON HCL INJECTION 0.25 MG/5ML, 0.25 mg, Intravenous,  Once, 0 of 4 cycles leucovorin 844 mg in dextrose 5 % 250 mL infusion, 400 mg/m2, Intravenous,  Once, 0 of 4 cycles oxaliplatin (ELOXATIN) 180 mg in dextrose 5 % 500 mL chemo infusion, 85  mg/m2, Intravenous,  Once, 0 of 4 cycles fluorouracil (ADRUCIL) chemo injection 850 mg, 400 mg/m2, Intravenous,  Once, 0 of 4 cycles fluorouracil (ADRUCIL) 5,050 mg in sodium chloride 0.9 % 149 mL chemo infusion, 2,400 mg/m2, Intravenous, 1 Day/Dose, 0 of 4 cycles bevacizumab-bvzr (ZIRABEV) 400 mg in sodium chloride 0.9 % 100 mL chemo infusion, 5 mg/kg, Intravenous,  Once, 0 of 4 cycles  for chemotherapy treatment.      Interval history- Oscar [name], 78 year old male patient, who presents to chemo care clinic today for initial meeting in preparation for starting chemotherapy. I introduced the chemo care clinic and we discussed that the role of the clinic is to assist those who are at an increased risk of emergency room visits and/or complications during the course of chemotherapy treatment. We discussed that the increased risk takes into account factors such as age, performance status, and co-morbidities. We also discussed that for some, this might include barriers to care such as not having a primary care provider, lack of insurance/transportation, or not being able to afford medications. We discussed that the goal of the program is to help prevent unplanned ER visits and help reduce complications during chemotherapy. We do this by discussing specific risk factors to each individual and identifying ways that we can help improve these risk factors and reduce barriers to care.  We discussed that he was identified as low risk primarily based on prior hospital admissions, prior ER visits, Medicare status, history of diabetes and liver disease.  ECOG FS:1 - Symptomatic but completely ambulatory  Review of systems- Review of Systems  Constitutional: Positive for malaise/fatigue. Negative for chills, fever and weight loss.  HENT: Negative for hearing loss,  nosebleeds, sore throat and tinnitus.   Eyes: Negative for blurred vision and double vision.  Respiratory: Negative for cough, hemoptysis, shortness  of breath and wheezing.   Cardiovascular: Negative for chest pain, palpitations and leg swelling.  Gastrointestinal: Negative for abdominal pain, blood in stool, constipation, diarrhea, melena, nausea and vomiting.  Genitourinary: Negative for dysuria and urgency.  Musculoskeletal: Positive for back pain (Chronic) and joint pain (Chronic). Negative for falls and myalgias.  Skin: Negative for itching and rash.  Neurological: Negative for dizziness, tingling, sensory change, loss of consciousness, weakness and headaches.  Endo/Heme/Allergies: Negative for environmental allergies. Does not bruise/bleed easily.  Psychiatric/Behavioral: Negative for depression. The patient is not nervous/anxious and does not have insomnia.      Current treatment-plan for FOLFOX plus Avastin  Allergies  Allergen Reactions  . Ace Inhibitors Swelling    Past Medical History:  Diagnosis Date  . Arthritis   . Diabetes mellitus without complication ([place])   . H/O colon cancer, stage IV   . Hyperlipemia   . Hypertension     Past Surgical History:  Procedure Laterality Date  . JOINT REPLACEMENT      Social History   Socioeconomic History  . Marital status: Married    Spouse name: Not on file  . Number of children: Not on file  . Years of education: Not on file  . Highest education level: Not on file  Occupational History  . Not on file  Social Needs  . Financial resource strain: Not on file  . Food insecurity    Worry: Not on file    Inability: Not on file  . Transportation needs    Medical: Not on file    Non-medical: Not on file  Tobacco Use  . Smoking status: Current Every Day Smoker    Packs/day: 1.00    Types: Cigarettes  . Smokeless tobacco: Never Used  Substance and Sexual Activity  . Alcohol use: No  . Drug use: No  . Sexual activity: Not on file  Lifestyle  . Physical activity    Days per week: Not on file    Minutes per session: Not on file  . Stress: Not on file   Relationships  . Social Herbalist on phone: Not on file    Gets together: Not on file    Attends religious service: Not on file    Active member of club or organization: Not on file    Attends meetings of clubs or organizations: Not on file    Relationship status: Not on file  . Intimate partner violence    Fear of current or ex partner: Not on file    Emotionally abused: Not on file    Physically abused: Not on file    Forced sexual activity: Not on file  Other Topics Concern  . Not on file  Social History Narrative   Recruitment consultant retd; lives in [place]; smoking 3cig/day; [3/4 ppd x started at 9 years]; no alcohol. Son & daughter; wife dementia [waiting for placement].     Family History  Problem Relation Age of Onset  . Peptic Ulcer Disease Father      Current Outpatient Medications:  .  amLODipine (NORVASC) 5 MG tablet, Take 5 mg by mouth daily., Disp: , Rfl:  .  fluticasone (FLONASE) 50 MCG/ACT nasal spray, Place into the nose., Disp: , Rfl:  .  lidocaine-prilocaine (EMLA) cream, Apply 1 application topically as needed., Disp: 30 g, Rfl: 0 .  ondansetron (ZOFRAN-ODT) 4 MG disintegrating tablet, Take 1 tablet (4 mg total) by mouth every 6 (six) hours as needed for nausea. (Patient not taking: Reported on 07/05/2019), Disp: 20 tablet, Rfl: 0 .  pravastatin (PRAVACHOL) 40 MG tablet, Take 40 mg by mouth daily., Disp: , Rfl:  .  prochlorperazine (COMPAZINE) 10 MG tablet, Take 1 tablet (10 mg total) by mouth every 6 (six) hours as needed for nausea or vomiting., Disp: 40 tablet, Rfl: 1 .  tamsulosin (FLOMAX) 0.4 MG CAPS capsule, Take 1 capsule by mouth daily., Disp: , Rfl:   Physical exam: There were no vitals filed for this visit. Physical Exam Constitutional:      General: He is not in acute distress.    Comments: Unaccompanied.  Wearing mask.  HENT:     Head: Normocephalic and atraumatic.  Eyes:     General: No scleral icterus.     Conjunctiva/sclera: Conjunctivae normal.  Cardiovascular:     Rate and Rhythm: Normal rate and regular rhythm.  Pulmonary:     Effort: Pulmonary effort is normal.     Breath sounds: Normal breath sounds.  Abdominal:     General: There is no distension.     Palpations: Abdomen is soft.     Tenderness: There is no abdominal tenderness. There is no guarding.  Skin:    General: Skin is warm and dry.  Neurological:     Mental Status: He is alert and oriented to person, place, and time.  Psychiatric:        Mood and Affect: Mood normal.        Behavior: Behavior normal.        Thought Content: Thought content normal.      CMP Latest Ref Rng & Units 07/05/2019  Glucose 70 - 99 mg/dL 112(H)  BUN 8 - 23 mg/dL 11  Creatinine 0.61 - 1.24 mg/dL 0.61  Sodium 135 - 145 mmol/L 139  Potassium 3.5 - 5.1 mmol/L 4.0  Chloride 98 - 111 mmol/L 108  CO2 22 - 32 mmol/L 26  Calcium 8.9 - 10.3 mg/dL 9.0  Total Protein 6.5 - 8.1 g/dL 7.6  Total Bilirubin 0.3 - 1.2 mg/dL 0.4  Alkaline Phos 38 - 126 U/L 95  AST 15 - 41 U/L 18  ALT 0 - 44 U/L 15   CBC Latest Ref Rng & Units 07/05/2019  WBC 4.0 - 10.5 K/uL 7.0  Hemoglobin 13.0 - 17.0 g/dL 12.7(L)  Hematocrit 39.0 - 52.0 % 38.7(L)  Platelets 150 - 400 K/uL 253    No images are attached to the encounter.  No results found.   Assessment and plan- Patient is a 78 y.o. male who presents to [facility] for initial meeting in preparation for starting chemotherapy for the treatment of colon cancer.    1. Colon Cancer-right-sided colon adenocarcinoma with synchronous metastasis to liver.  MSI stable.  Plan to start FOLFOX plus Avastin with Dr.Brahmanday.  Awaiting results of foundation one and pd-l1.  Port placement scheduled for 07/20/2019.  We again reviewed the potential side effects of chemotherapy including numbness and tingling particularly of extremities, possible cold sensitivity.  Patient expresses understanding and wishes to proceed with  chemotherapy.  We discussed incurable nature of his disease and treatment is given with palliative intent.  2. Chemo Care Clinic/High Risk for ER/Hospitalization during chemotherapy- We discussed the role of the chemo care clinic and identified patient specific risk factors. I discussed that patient was identified as low risk primarily based  on: Prior hospital admissions and ER visits, Medicare status, history of diabetes and liver disease.  Patient has a primary care doctor whom he sees regularly.  3. Social Determinants of Health- we discussed that social determinants of health may have significant impacts on health and outcomes for cancer patients.  Today we discussed specific social determinants of performance status, alcohol use, depression, financial needs, food insecurity, housing, interpersonal violence, social connections, stress, tobacco use, and transportation.  After lengthy discussion he denies any specific needs.  He is the primary caregiver for his wife who has advanced dementia.  We discussed several of the programs available through the cancer center including but not limited to outpatient physical and occupational therapy, care program, counseling services, social work, Public librarian, food pantry for food insecurity, support groups, quit Smart, and transportation assistance.  4. Palliative Care- based on stage of cancer and/or identified needs today, I will refer patient to palliative care for goals of care and advanced care planning.  We also discussed the role of the Symptom Management Clinic at [facility] for acute issues and methods of contacting clinic/provider. He denies needing specific assistance at this time and He will be followed by Mariea Clonts, RN (Nurse Navigator).  Follow-up with Dr. Rogue Bussing as scheduled.   Visit Diagnosis 1. Cancer of right colon [facility])    Patient expressed understanding and was in agreement with this plan. He also understands that He can call  clinic at any time with any questions, concerns, or complaints.   A total of (25) minutes of face-to-face time was spent with this patient with greater than 50% of that time in counseling and care-coordination.  Beckey Rutter, DNP, AGNP-C [place] at [facility] [phone] (clinic)  CC: Dr. Rogue Bussing

## 2019-07-19 ENCOUNTER — Other Ambulatory Visit: Payer: Self-pay | Admitting: Radiology

## 2019-07-19 ENCOUNTER — Other Ambulatory Visit: Payer: Self-pay | Admitting: Student

## 2019-07-19 LAB — SARS CORONAVIRUS 2 (TAT 6-24 HRS): SARS Coronavirus 2: NEGATIVE

## 2019-07-20 ENCOUNTER — Ambulatory Visit
Admission: RE | Admit: 2019-07-20 | Discharge: 2019-07-20 | Disposition: A | Discharge status: Home / Self Care | Payer: Medicare HMO | Source: Ambulatory Visit | Attending: Internal Medicine | Admitting: Internal Medicine

## 2019-07-20 ENCOUNTER — Other Ambulatory Visit: Payer: Self-pay

## 2019-07-20 DIAGNOSIS — E119 Type 2 diabetes mellitus without complications: Secondary | ICD-10-CM | POA: Insufficient documentation

## 2019-07-20 DIAGNOSIS — C182 Malignant neoplasm of ascending colon: Secondary | ICD-10-CM

## 2019-07-20 DIAGNOSIS — I10 Essential (primary) hypertension: Secondary | ICD-10-CM | POA: Insufficient documentation

## 2019-07-20 DIAGNOSIS — Z79899 Other long term (current) drug therapy: Secondary | ICD-10-CM | POA: Diagnosis not present

## 2019-07-20 DIAGNOSIS — F17210 Nicotine dependence, cigarettes, uncomplicated: Secondary | ICD-10-CM | POA: Diagnosis not present

## 2019-07-20 DIAGNOSIS — E785 Hyperlipidemia, unspecified: Secondary | ICD-10-CM | POA: Insufficient documentation

## 2019-07-20 DIAGNOSIS — Z5111 Encounter for antineoplastic chemotherapy: Secondary | ICD-10-CM | POA: Diagnosis not present

## 2019-07-20 DIAGNOSIS — C189 Malignant neoplasm of colon, unspecified: Secondary | ICD-10-CM | POA: Insufficient documentation

## 2019-07-20 HISTORY — PX: IR IMAGING GUIDED PORT INSERTION: IMG5740

## 2019-07-20 LAB — BASIC METABOLIC PANEL
Anion gap: 9 (ref 5–15)
BUN: 15 mg/dL (ref 8–23)
CO2: 23 mmol/L (ref 22–32)
Calcium: 9.3 mg/dL (ref 8.9–10.3)
Chloride: 108 mmol/L (ref 98–111)
Creatinine, Ser: 0.61 mg/dL (ref 0.61–1.24)
GFR calc Af Amer: >60 mL/min (ref >60)
GFR calc non Af Amer: >60 mL/min (ref >60)
Glucose, Bld: 122 mg/dL — ABNORMAL HIGH (ref 70–99)
Potassium: 3.9 mmol/L (ref 3.5–5.1)
Sodium: 140 mmol/L (ref 135–145)

## 2019-07-20 LAB — CBC
HCT: 38.9 % — ABNORMAL LOW (ref 39.0–52.0)
Hemoglobin: 13 g/dL (ref 13.0–17.0)
MCH: 30.6 pg (ref 26.0–34.0)
MCHC: 33.4 g/dL (ref 30.0–36.0)
MCV: 91.5 fL (ref 80.0–100.0)
Platelets: 208 10*3/uL (ref 150–400)
RBC: 4.25 MIL/uL (ref 4.22–5.81)
RDW: 12.1 % (ref 11.5–15.5)
WBC: 5.6 10*3/uL (ref 4.0–10.5)
nRBC: 0 % (ref 0.0–0.2)

## 2019-07-20 LAB — PROTIME-INR
INR: 1.1 (ref 0.8–1.2)
Prothrombin Time: 14.1 seconds (ref 11.4–15.2)

## 2019-07-20 LAB — GLUCOSE, CAPILLARY: Glucose-Capillary: 125 mg/dL — ABNORMAL HIGH (ref 70–99)

## 2019-07-20 IMAGING — XA IR IMAGING GUIDED PORT INSERTION
2 series · 2 of 2 positions shown · non-contrast
Comparison: None.

INDICATION: History of metastatic colon cancer. In need of durable intravenous
access for chemotherapy administration.

EXAM:
IMPLANTED PORT A CATH PLACEMENT WITH ULTRASOUND AND FLUOROSCOPIC
GUIDANCE

[Series 1: vasc extremity · 1 of 1 slices shown]
[im 1/1]
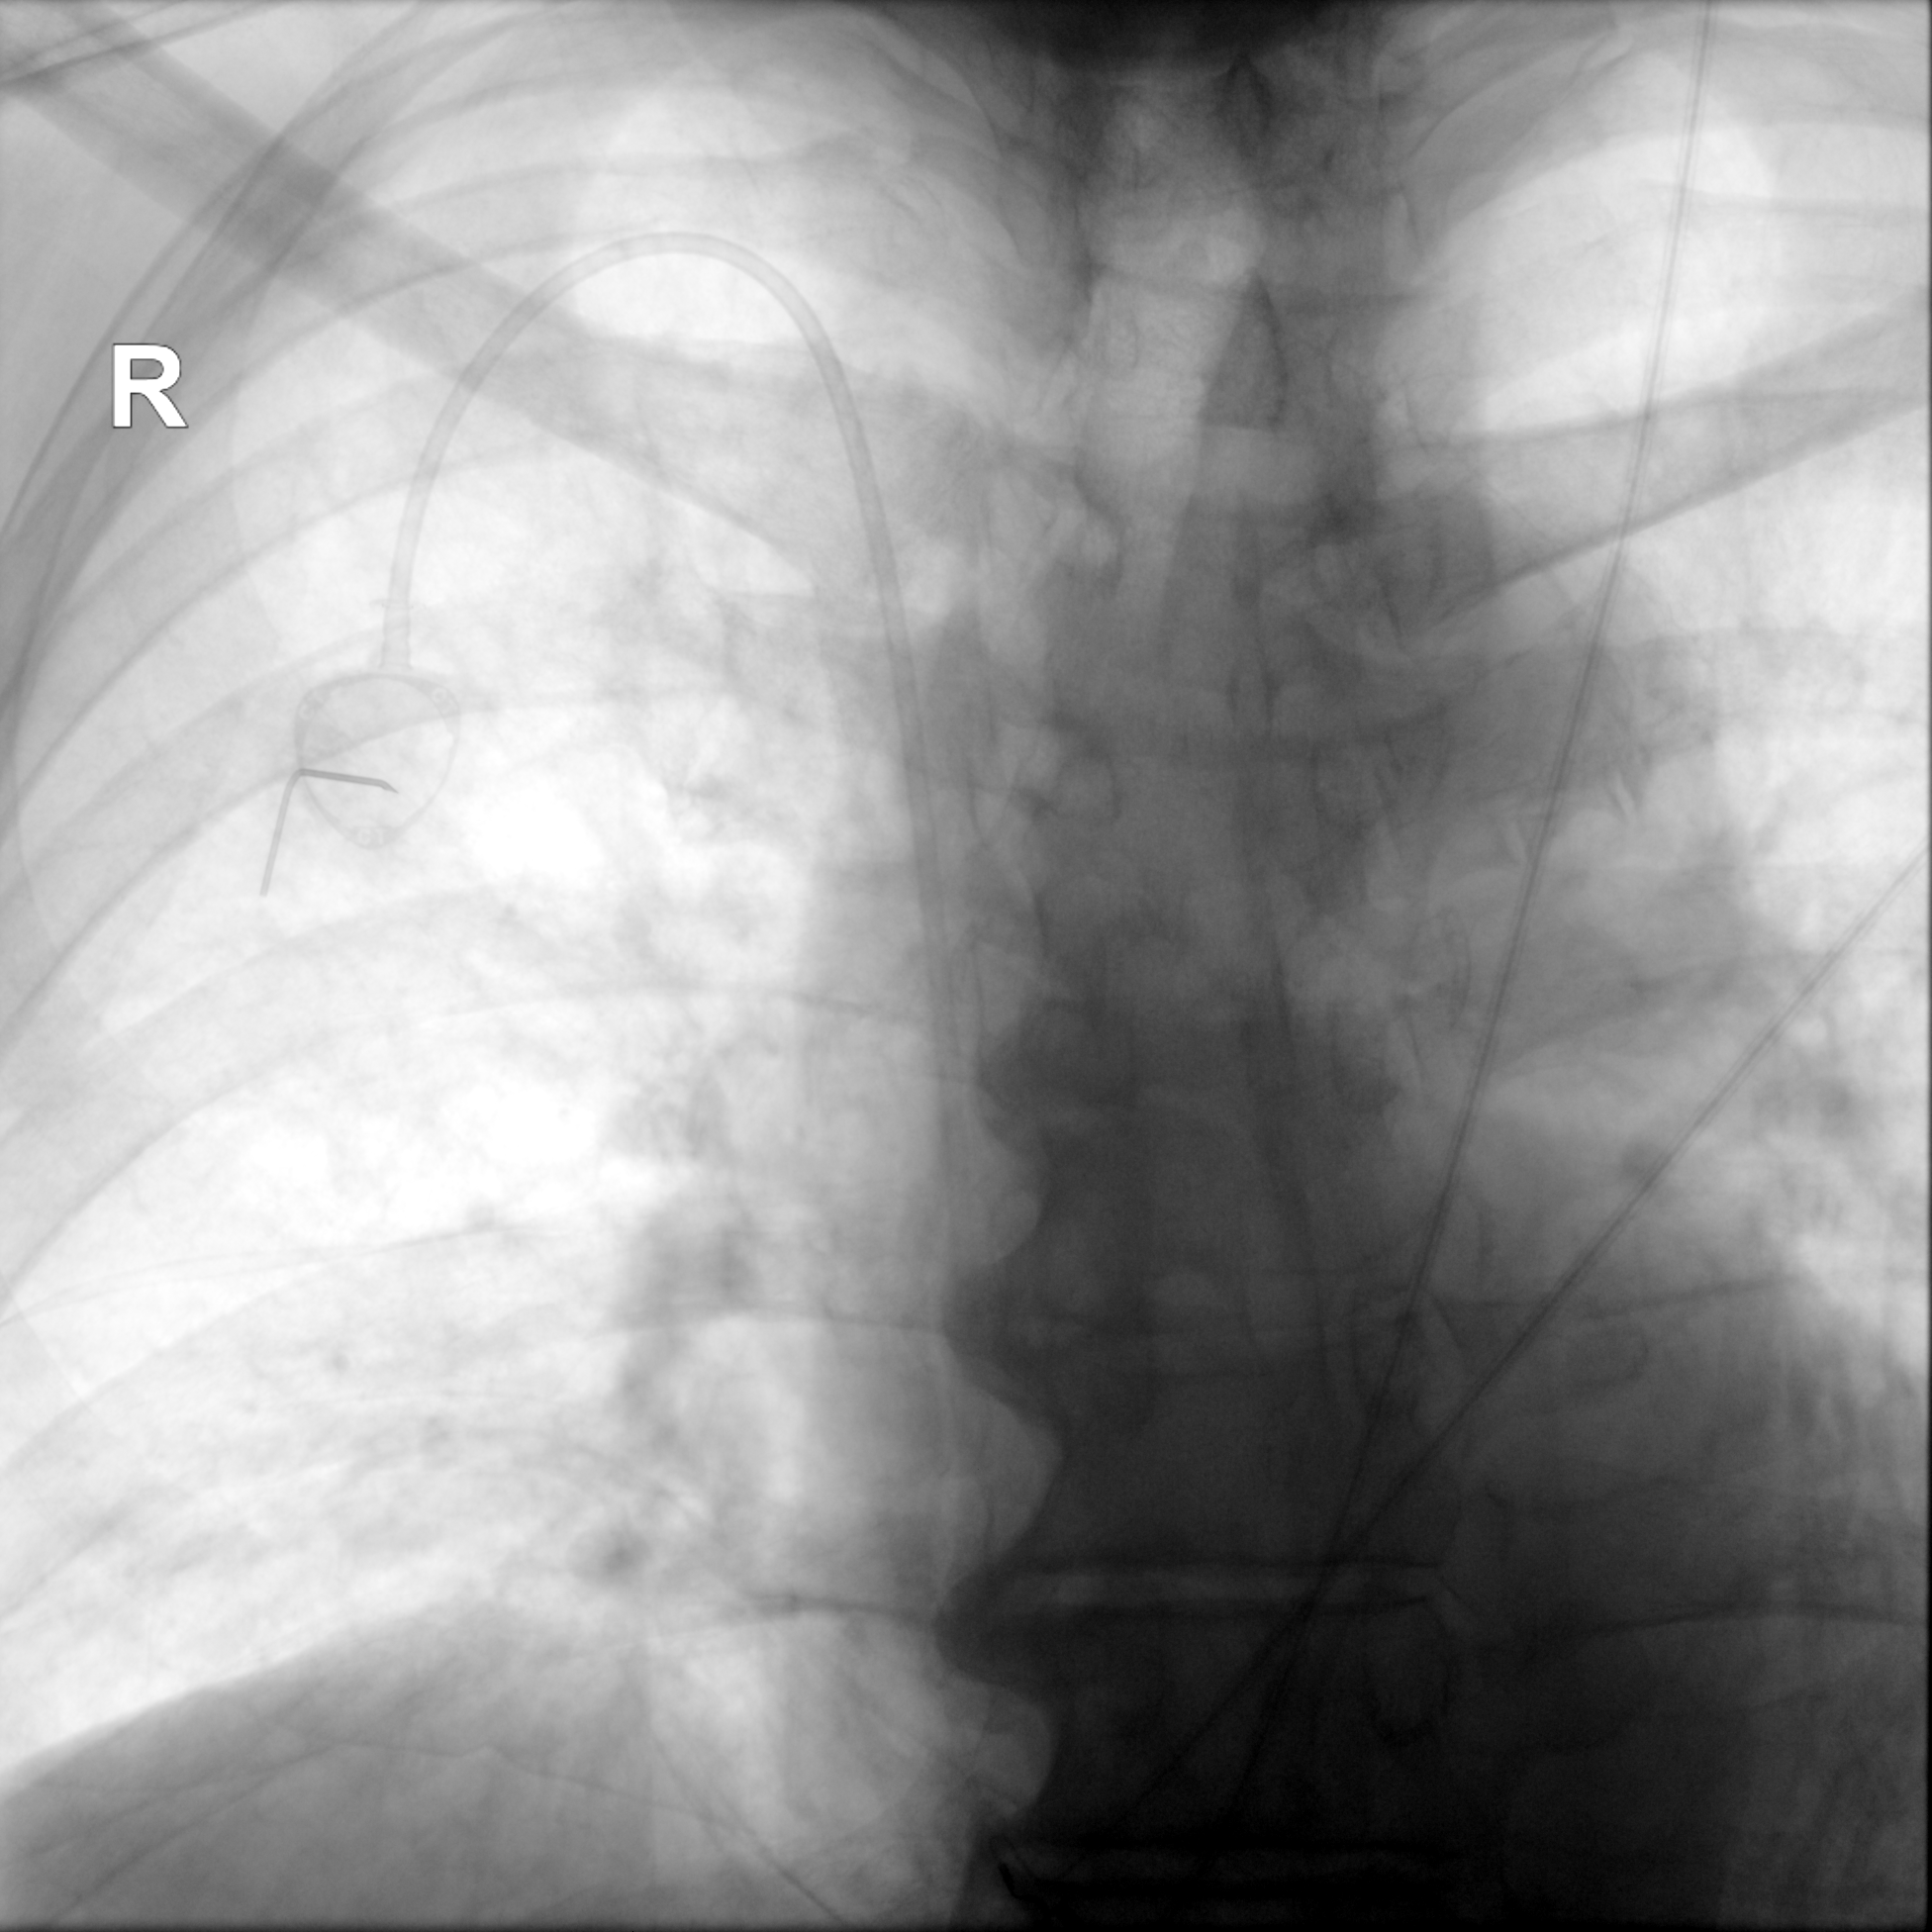

[Series 1: ir fluoro/shunt/fist · 1 of 1 slices shown]
[im 1/1]
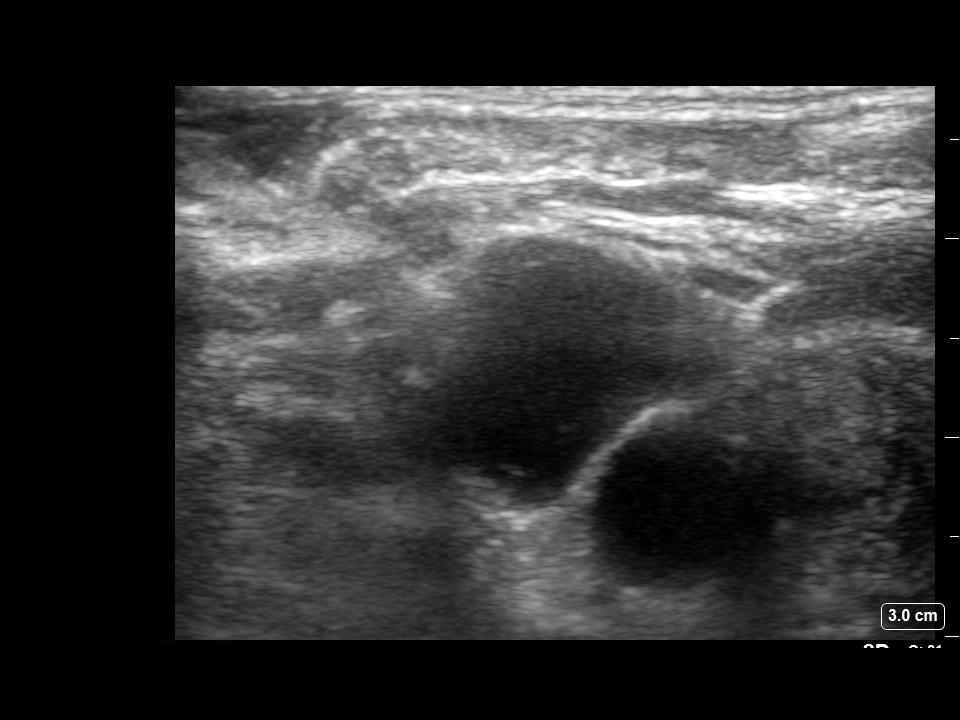

[2 of 2 positions shown; findings below may reference images not displayed]

MEDICATIONS:
Ancef 2 g IV;; The antibiotic was administered within an appropriate
time interval prior to skin puncture.

ANESTHESIA/SEDATION:
Moderate (conscious) sedation was employed during this procedure. A
total of Versed 2 mg and Fentanyl 100 mcg was administered
intravenously.

Moderate Sedation Time: 21 minutes. The patient's level of
consciousness and vital signs were monitored continuously by
radiology nursing throughout the procedure under my direct
supervision.

CONTRAST:  None

FLUOROSCOPY TIME:  36 seconds (10.7 mGy)

COMPLICATIONS:
None immediate.

PROCEDURE:
The procedure, risks, benefits, and alternatives were explained to
the patient. Questions regarding the procedure were encouraged and
answered. The patient understands and consents to the procedure.

The right neck and chest were prepped with chlorhexidine in a
sterile fashion, and a sterile drape was applied covering the
operative field. Maximum barrier sterile technique with sterile
gowns and gloves were used for the procedure. A timeout was
performed prior to the initiation of the procedure. Local anesthesia
was provided with 1% lidocaine with epinephrine.

After creating a small venotomy incision, a micropuncture kit was
utilized to access the internal jugular vein. Real-time ultrasound
guidance was utilized for vascular access including the acquisition
of a permanent ultrasound image documenting patency of the accessed
vessel. The microwire was utilized to measure appropriate catheter
length.

A subcutaneous port pocket was then created along the upper chest
wall utilizing a combination of sharp and blunt dissection. The
pocket was irrigated with sterile saline. A single lumen power
injectable port was chosen for placement. The 8 Fr catheter was
tunneled from the port pocket site to the venotomy incision. The
port was placed in the pocket. The external catheter was trimmed to
appropriate length. At the venotomy, an 8 Fr peel-away sheath was
placed over a guidewire under fluoroscopic guidance. The catheter
was then placed through the sheath and the sheath was removed. Final
catheter positioning was confirmed and documented with a
fluoroscopic spot radiograph. The port was accessed with CEOLA
needle, aspirated and flushed with heparinized saline.

The venotomy site was closed with an interrupted 4-0 Vicryl suture.
The port pocket incision was closed with interrupted 2-0 Vicryl
suture and the skin was opposed with a running subcuticular 4-0
Vicryl suture. Dermabond and CEOLA were applied to both
incisions. Dressings were placed. The patient tolerated the
procedure well without immediate post procedural complication.
FINDINGS: After catheter placement, the tip lies within the superior
cavoatrial junction. The catheter aspirates and flushes normally and
is ready for immediate use.
IMPRESSION: Successful placement of a right internal jugular approach power
injectable Port-A-Cath. The catheter is ready for immediate use.

## 2019-07-20 MED ORDER — CEFAZOLIN SODIUM-DEXTROSE 2-4 GM/100ML-% IV SOLN
INTRAVENOUS | Status: AC
  Filled 2019-07-20: qty 100

## 2019-07-20 MED ORDER — HEPARIN SOD (PORK) LOCK FLUSH 100 UNIT/ML IV SOLN
INTRAVENOUS | Status: AC
  Filled 2019-07-20: qty 5

## 2019-07-20 MED ORDER — MIDAZOLAM HCL 2 MG/2ML IJ SOLN
INTRAMUSCULAR | Status: AC | PRN
  Administered 2019-07-20 (×2): 1 mg via INTRAVENOUS

## 2019-07-20 MED ORDER — MIDAZOLAM HCL 5 MG/5ML IJ SOLN
INTRAMUSCULAR | Status: AC
  Filled 2019-07-20: qty 5

## 2019-07-20 MED ORDER — FENTANYL CITRATE (PF) 100 MCG/2ML IJ SOLN
INTRAMUSCULAR | Status: AC | PRN
  Administered 2019-07-20 (×2): 50 ug via INTRAVENOUS

## 2019-07-20 MED ORDER — FENTANYL CITRATE (PF) 100 MCG/2ML IJ SOLN
INTRAMUSCULAR | Status: AC
  Filled 2019-07-20: qty 2

## 2019-07-20 MED ORDER — SODIUM CHLORIDE 0.9 % IV SOLN
INTRAVENOUS | Status: DC
  Administered 2019-07-20: 11:00:00 via INTRAVENOUS

## 2019-07-20 MED ORDER — CEFAZOLIN SODIUM-DEXTROSE 2-4 GM/100ML-% IV SOLN
2.0000 g | INTRAVENOUS | Status: AC
  Administered 2019-07-20: 12:00:00 2 g via INTRAVENOUS

## 2019-07-20 NOTE — Procedures (Signed)
Pre Procedure Dx: Colon Cancer Post Procedural Dx: Same  Successful placement of right IJ approach port-a-cath with tip at the superior caval atrial junction. The catheter is ready for immediate use.  Estimated Blood Loss: Minimal  Complications: None immediate.  Jay Melissia Lahman, MD Pager #: [phone]   

## 2019-07-20 NOTE — Consult Note (Signed)
Chief Complaint: Metastatic colon cancer  Referring Physician(s): Brahmanday,Govinda R  Patient Status: [facility] - Out-pt  History of Present Illness: Oscar [name] is a 78 y.o. male with past medical history significant for diabetes, hypertension hyperlipidemia with recent diagnosis of metastatic colon cancer presents today for image guided placement of a portacatheter.  The patient is unaccompanied and serves as his own historian.  Patient is currently without complaint.  Specifically, no chest pain, shortness of breath, fever or chills.    Past Medical History:  Diagnosis Date  . Arthritis   . Diabetes mellitus without complication ([place])   . H/O colon cancer, stage IV   . Hyperlipemia   . Hypertension     Past Surgical History:  Procedure Laterality Date  . JOINT REPLACEMENT      Allergies: Ace inhibitors  Medications: Prior to Admission medications   Medication Sig Start Date End Date Taking? Authorizing Provider  amLODipine (NORVASC) 5 MG tablet Take 5 mg by mouth daily.   Yes [provider]  pravastatin (PRAVACHOL) 40 MG tablet Take 40 mg by mouth daily.   Yes [provider]  tamsulosin (FLOMAX) 0.4 MG CAPS capsule Take 1 capsule by mouth daily. 04/13/19 04/12/20 Yes [provider]  fluticasone (FLONASE) 50 MCG/ACT nasal spray Place into the nose.    [provider]  lidocaine-prilocaine (EMLA) cream Apply 1 application topically as needed. 07/13/19   Cammie Sickle, MD  ondansetron (ZOFRAN-ODT) 4 MG disintegrating tablet Take 1 tablet (4 mg total) by mouth every 6 (six) hours as needed for nausea. Patient not taking: Reported on 07/05/2019 03/08/19   Herbert Pun, MD  prochlorperazine (COMPAZINE) 10 MG tablet Take 1 tablet (10 mg total) by mouth every 6 (six) hours as needed for nausea or vomiting. 07/13/19   Cammie Sickle, MD     Family History  Problem Relation Age of Onset  . Peptic Ulcer Disease  Father     Social History   Socioeconomic History  . Marital status: Married    Spouse name: Not on file  . Number of children: Not on file  . Years of education: Not on file  . Highest education level: Not on file  Occupational History  . Not on file  Social Needs  . Financial resource strain: Not on file  . Food insecurity    Worry: Not on file    Inability: Not on file  . Transportation needs    Medical: Not on file    Non-medical: Not on file  Tobacco Use  . Smoking status: Current Every Day Smoker    Packs/day: 0.25    Types: Cigarettes  . Smokeless tobacco: Never Used  . Tobacco comment: 1 to 2 cigarettes a day occasionally  Substance and Sexual Activity  . Alcohol use: No  . Drug use: No  . Sexual activity: Not on file  Lifestyle  . Physical activity    Days per week: Not on file    Minutes per session: Not on file  . Stress: Not on file  Relationships  . Social Herbalist on phone: Not on file    Gets together: Not on file    Attends religious service: Not on file    Active member of club or organization: Not on file    Attends meetings of clubs or organizations: Not on file    Relationship status: Not on file  Other Topics Concern  . Not  on file  Social History Narrative   Recruitment consultant retd; lives in [place]; smoking 3cig/day; [3/4 ppd x started at 9 years]; no alcohol. Son & daughter; wife dementia [waiting for placement].     ECOG Status: 1 - Symptomatic but completely ambulatory  Review of Systems: A 12 point ROS discussed and pertinent positives are indicated in the HPI above.  All other systems are negative.  Review of Systems  All other systems reviewed and are negative.   Vital Signs: BP 120/65   Pulse 88   Temp 98.4 F (36.9 C) (Oral)   Resp 18   Ht 6' (1.829 m)   Wt 86.6 kg   SpO2 97%   BMI 25.90 kg/m   Physical Exam  Imaging: No results found.  Labs:  CBC: Recent Labs    03/05/19 0537  03/07/19 0322 07/05/19 1605 07/20/19 1036  WBC 13.7* 17.6* 7.0 5.6  HGB 11.0* 10.5* 12.7* 13.0  HCT 32.5* 31.5* 38.7* 38.9*  PLT 326 271 253 208    COAGS: Recent Labs    03/08/19 0431  INR 1.6*  APTT 35    BMP: Recent Labs    03/04/19 1501 03/05/19 0537 03/08/19 0431 07/05/19 1605 07/20/19 1036  NA 133* 136  --  139 140  K 4.1 3.8  --  4.0 3.9  CL 103 106  --  108 108  CO2 20* 21*  --  26 23  GLUCOSE 242* 127*  --  112* 122*  BUN 18 16  --  11 15  CALCIUM 8.4* 8.4*  --  9.0 9.3  CREATININE 0.92 0.83 0.72 0.61 0.61  GFRNONAA >60 >60 >60 >60 >60  GFRAA >60 >60 >60 >60 >60    LIVER FUNCTION TESTS: Recent Labs    03/04/19 1501 03/05/19 0537 07/05/19 1605  BILITOT 0.9 1.0 0.4  AST 22 17 18   ALT 21 18 15   ALKPHOS 91 85 95  PROT 7.0 6.3* 7.6  ALBUMIN 3.2* 2.9* 3.9    TUMOR MARKERS: No results for input(s): AFPTM, CEA, CA199, CHROMGRNA in the last 8760 hours.  Assessment and Plan:  Oscar [name] is a 78 y.o. male with past medical history significant for diabetes, hypertension hyperlipidemia with recent diagnosis of metastatic colon cancer presents today for image guided placement of a portacatheter.   Risks and benefits of image guided port-a-catheter placement was discussed with the patient including, but not limited to bleeding, infection, pneumothorax, or fibrin sheath development and need for additional procedures.  All of the patient's questions were answered, patient is agreeable to proceed. Consent signed and in chart.    Thank you for this interesting consult.  I greatly enjoyed meeting Oscar [name] and look forward to participating in their care.  A copy of this report was sent to the requesting provider on this date.  Electronically Signed: Sandi Mariscal, MD 07/20/2019, 11:16 AM   I spent a total of 15 Minutes in face to face in clinical consultation, greater than 50% of which was counseling/coordinating care for image guided  portacatheter placement.

## 2019-07-20 NOTE — Discharge Instructions (Signed)
Implanted Port Removal, Care After Refer to this sheet in the next few weeks. These instructions provide you with information about caring for yourself after your procedure. Your health care provider may also give you more specific instructions. Your treatment has been planned according to current medical practices, but problems sometimes occur. Call your health care provider if you have any problems or questions after your procedure. What can I expect after the procedure? After the procedure, it is common to have:  Soreness or pain near your incision.  Some swelling or bruising near your incision.  Follow these instructions at home: Medicines  Take over-the-counter and prescription medicines only as told by your health care provider.  If you were prescribed an antibiotic medicine, take it as told by your health care provider. Do not stop taking the antibiotic even if you start to feel better. Bathing  You may shower tomorrow.  Incision care ? You have an incision with sutures that are dissolvable, steri strips and guaze dressing.Marland Kitchen Keep your dressing dry and leave on until your port is accessed.  Steri strips will fall off on their own.   Check your incision area every day for signs of infection and if present call your doctor and let them know. Check for: ? More redness, swelling, or pain. ? More fluid or blood. ? Warmth. ? Pus or a bad smell. Driving  If you received a sedative, do not drive for 24 hours after the procedure.  If you did not receive a sedative, ask your health care provider when it is safe to drive. Activity  Return to your normal activities as told by your health care provider. Ask your health care provider what activities are safe for you.  Until your health care provider says it is safe: ? Do not lift anything that is heavier than 10 lb (4.5 kg). ? Do not do activities that involve lifting your arms over your head. General instructions  Do not use any  tobacco products, such as cigarettes, chewing tobacco, and e-cigarettes. Tobacco can delay healing. If you need help quitting, ask your health care provider.  Keep all follow-up visits as told by your health care provider. This is important. Contact a health care provider if:  You have more redness, swelling, or pain around your incision.  You have more fluid or blood coming from your incision.  Your incision feels warm to the touch.  You have pus or a bad smell coming from your incision.  You have a fever.  You have pain that is not relieved by your pain medicine. Get help right away if:  You have chest pain.  You have difficulty breathing. This information is not intended to replace advice given to you by your health care provider. Make sure you discuss any questions you have with your health care provider. Document Released: 09/16/2015 Document Revised: 03/12/2016 Document Reviewed: 07/10/2015 Elsevier Interactive Patient Education  [employer].

## 2019-07-21 ENCOUNTER — Encounter: Payer: Self-pay | Admitting: Internal Medicine

## 2019-07-23 NOTE — Assessment & Plan Note (Addendum)
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 plan FOLFOX plus Avastin therapy.  #Proceed with FOLFOX only today CBC CMP are reviewed; adequate today; no contraindications to chemotherapy. Proceed with treatment today.  Hold Avastin recent port placement.  #Again reviewed the side effects of chemo -including tingling and numbness/cold sensitivity with oxaliplatin.  # DISPOSITION: # FOLFOX only today. # follow up in 2 weeks;MD; labs- cbc/cmp/UA/ CEA; FOLFOX + avastin- Dr.B

## 2019-07-23 NOTE — Progress Notes (Signed)
[facility] CONSULT NOTE  Patient Care Team: Sofie Hartigan, MD as PCP - General (Family Medicine)  CHIEF COMPLAINTS/PURPOSE OF CONSULTATION: Colon cancer  #  Oncology History Overview Note  # MAY 2020- 3. 03/09/19 Liver biopsy. Microscopic examination shows malignant cells with glandular architecture consistent with adenocarcinoma. The malignant cells are positive for CK20 and CDX-2. These findings support the clinical impression of metastatic colon adenocarcinoma. 4. 03/10/19 R hemicolectomy. Tumor site cecum. Adenocarcinoma. Mucinous features present. G2. No tumor deposits. Invades visceral peritoneum. No tumor perforation. LVI present. PNI not identified. All margins uninvolved. 1/12 LNs. PT4apN1. Periappendiceal inflammation c/w resolving abscess. Microsatellite stable (MSS). [Dr.Mettu; [facility]]  # SEP 4th 2020 [compared to May 2020]  Interval increase in size of the metastases to the hepatic dome, The metastasis to the left hepatic lobe is unchanged; 2.  New subcentimeter hypoattenuating lesion in the inferior right hepatic lobe, incompletely characterized on CT. 3.  Postsurgical changes following right hemicolectomy. # NGS-Pending  DIAGNOSIS: COLON CANCER  STAGE:  IV       ;  GOALS:Palliative  CURRENT/MOST RECENT THERAPY : FOLFOX    Cancer of right colon ([place])  07/05/2019 Initial Diagnosis   Cancer of right colon ([place])   07/24/2019 -  Chemotherapy   The patient had palonosetron (ALOXI) injection 0.25 mg, 0.25 mg, Intravenous,  Once, 1 of 4 cycles Administration: 0.25 mg (07/24/2019) leucovorin 800 mg in dextrose 5 % 250 mL infusion, 844 mg, Intravenous,  Once, 1 of 4 cycles Administration: 800 mg (07/24/2019) oxaliplatin (ELOXATIN) 180 mg in dextrose 5 % 500 mL chemo infusion, 85 mg/m2 = 180 mg, Intravenous,  Once, 1 of 4 cycles Administration: 180 mg (07/24/2019) fluorouracil (ADRUCIL) 5,000 mg in sodium chloride 0.9 % 150 mL chemo infusion, 5,050 mg, Intravenous, 1  Day/Dose, 1 of 4 cycles Administration: 5,000 mg (07/24/2019) bevacizumab-bvzr (ZIRABEV) 400 mg in sodium chloride 0.9 % 100 mL chemo infusion, 5 mg/kg = 400 mg, Intravenous,  Once, 1 of 4 cycles  for chemotherapy treatment.      HISTORY OF PRESENTING ILLNESS:  Oscar [name] 78 y.o.  male with metastatic colon cancer to the liver is here for follow-up/proceed with chemotherapy.  Patient finally agreeable to proceed with chemotherapy.  Had a port placed yesterday.  No fever no chills but no nausea no vomiting.  Appetite is good.    Review of Systems  Constitutional: Positive for malaise/fatigue. Negative for chills, diaphoresis, fever and weight loss.  HENT: Negative for nosebleeds and sore throat.   Eyes: Negative for double vision.  Respiratory: Negative for cough, hemoptysis, sputum production, shortness of breath and wheezing.   Cardiovascular: Negative for chest pain, palpitations, orthopnea and leg swelling.  Gastrointestinal: Negative for abdominal pain, blood in stool, constipation, diarrhea, heartburn, melena, nausea and vomiting.  Genitourinary: Negative for dysuria, frequency and urgency.  Musculoskeletal: Positive for back pain and joint pain.  Skin: Negative.  Negative for itching and rash.  Neurological: Negative for dizziness, tingling, focal weakness, weakness and headaches.  Endo/Heme/Allergies: Does not bruise/bleed easily.  Psychiatric/Behavioral: Negative for depression. The patient is not nervous/anxious and does not have insomnia.      MEDICAL HISTORY:  Past Medical History:  Diagnosis Date  . Arthritis   . Diabetes mellitus without complication ([place])   . H/O colon cancer, stage IV   . Hyperlipemia   . Hypertension     SURGICAL HISTORY: Past Surgical History:  Procedure Laterality Date  . IR IMAGING GUIDED PORT INSERTION  07/20/2019  . JOINT REPLACEMENT      SOCIAL HISTORY: Social History   Socioeconomic History  . Marital status: Married     Spouse name: Not on file  . Number of children: Not on file  . Years of education: Not on file  . Highest education level: Not on file  Occupational History  . Not on file  Social Needs  . Financial resource strain: Not on file  . Food insecurity    Worry: Not on file    Inability: Not on file  . Transportation needs    Medical: Not on file    Non-medical: Not on file  Tobacco Use  . Smoking status: Current Every Day Smoker    Packs/day: 0.25    Types: Cigarettes  . Smokeless tobacco: Never Used  . Tobacco comment: 1 to 2 cigarettes a day occasionally  Substance and Sexual Activity  . Alcohol use: No  . Drug use: No  . Sexual activity: Not on file  Lifestyle  . Physical activity    Days per week: Not on file    Minutes per session: Not on file  . Stress: Not on file  Relationships  . Social Herbalist on phone: Not on file    Gets together: Not on file    Attends religious service: Not on file    Active member of club or organization: Not on file    Attends meetings of clubs or organizations: Not on file    Relationship status: Not on file  . Intimate partner violence    Fear of current or ex partner: Not on file    Emotionally abused: Not on file    Physically abused: Not on file    Forced sexual activity: Not on file  Other Topics Concern  . Not on file  Social History Narrative   Recruitment consultant retd; lives in [place]; smoking 3cig/day; [3/4 ppd x started at 9 years]; no alcohol. Son & daughter; wife dementia [waiting for placement].     FAMILY HISTORY: Family History  Problem Relation Age of Onset  . Peptic Ulcer Disease Father     ALLERGIES:  is allergic to ace inhibitors.  MEDICATIONS:  Current Outpatient Medications  Medication Sig Dispense Refill  . amLODipine (NORVASC) 5 MG tablet Take 5 mg by mouth daily.    . fluticasone (FLONASE) 50 MCG/ACT nasal spray Place into the nose.    . lidocaine-prilocaine (EMLA) cream Apply 1  application topically as needed. 30 g 0  . ondansetron (ZOFRAN-ODT) 4 MG disintegrating tablet Take 1 tablet (4 mg total) by mouth every 6 (six) hours as needed for nausea. 20 tablet 0  . pravastatin (PRAVACHOL) 40 MG tablet Take 40 mg by mouth daily.    . prochlorperazine (COMPAZINE) 10 MG tablet Take 1 tablet (10 mg total) by mouth every 6 (six) hours as needed for nausea or vomiting. 40 tablet 1  . tamsulosin (FLOMAX) 0.4 MG CAPS capsule Take 1 capsule by mouth daily.     No current facility-administered medications for this visit.       Marland Kitchen  PHYSICAL EXAMINATION: ECOG PERFORMANCE STATUS: 0 - Asymptomatic  Vitals:   07/24/19 0831  BP: 112/64  Pulse: 83  Temp: 98.4 F (36.9 C)   Filed Weights   07/24/19 0831  Weight: 187 lb (84.8 kg)    Physical Exam  Constitutional: He is oriented to person, place, and time and well-developed, well-nourished, and in no distress.  HENT:  Head: Normocephalic and atraumatic.  Mouth/Throat: Oropharynx is clear and moist. No oropharyngeal exudate.  Eyes: Pupils are equal, round, and reactive to light.  Neck: Normal range of motion. Neck supple.  Cardiovascular: Normal rate and regular rhythm.  Pulmonary/Chest: No respiratory distress. He has no wheezes.  Abdominal: Soft. Bowel sounds are normal. He exhibits no distension and no mass. There is no abdominal tenderness. There is no rebound and no guarding.  Musculoskeletal: Normal range of motion.        General: No tenderness or edema.  Neurological: He is alert and oriented to person, place, and time.  Skin: Skin is warm.  Psychiatric: Affect normal.   LABORATORY DATA:  I have reviewed the data as listed Lab Results  Component Value Date   WBC 6.7 07/24/2019   HGB 12.2 (L) 07/24/2019   HCT 36.8 (L) 07/24/2019   MCV 91.8 07/24/2019   PLT 215 07/24/2019   Recent Labs    03/05/19 0537  07/05/19 1605 07/20/19 1036 07/24/19 0813  NA 136  --  139 140 140  K 3.8  --  4.0 3.9 3.7  CL  106  --  108 108 106  CO2 21*  --  _0 GLUCOSE 127*  --  112* 122* 180*  BUN 16  --  _1 CREATININE 0.83   < > 0.61 0.61 0.80  CALCIUM 8.4*  --  9.0 9.3 9.0  GFRNONAA >60   < > >60 >60 >60  GFRAA >60   < > >60 >60 >60  PROT 6.3*  --  7.6  --  7.1  ALBUMIN 2.9*  --  3.9  --  3.6  AST 17  --  18  --  21  ALT 18  --  15  --  15  ALKPHOS 85  --  95  --  79  BILITOT 1.0  --  0.4  --  0.5   < > = values in this interval not displayed.    RADIOGRAPHIC STUDIES: I have personally reviewed the radiological images as listed and agreed with the findings in the report. Ir Imaging Guided Port Insertion  Result Date: 07/20/2019 INDICATION: History of metastatic colon cancer. In need of durable intravenous access for chemotherapy administration. EXAM: IMPLANTED PORT A CATH PLACEMENT WITH ULTRASOUND AND FLUOROSCOPIC GUIDANCE COMPARISON:  None. MEDICATIONS: Ancef 2 g IV;; The antibiotic was administered within an appropriate time interval prior to skin puncture. ANESTHESIA/SEDATION: Moderate (conscious) sedation was employed during this procedure. A total of Versed 2 mg and Fentanyl 100 mcg was administered intravenously. Moderate Sedation Time: 21 minutes. The patient's level of consciousness and vital signs were monitored continuously by radiology nursing throughout the procedure under my direct supervision. CONTRAST:  None FLUOROSCOPY TIME:  36 seconds (67.5 mGy) COMPLICATIONS: None immediate. PROCEDURE: The procedure, risks, benefits, and alternatives were explained to the patient. Questions regarding the procedure were encouraged and answered. The patient understands and consents to the procedure. The right neck and chest were prepped with chlorhexidine in a sterile fashion, and a sterile drape was applied covering the operative field. Maximum barrier sterile technique with sterile gowns and gloves were used for the procedure. A timeout was performed prior to the initiation of the procedure.  Local anesthesia was provided with 1% lidocaine with epinephrine. After creating a small venotomy incision, a micropuncture kit was utilized to access the internal jugular vein. Real-time ultrasound guidance was utilized for vascular access including the acquisition of a  permanent ultrasound image documenting patency of the accessed vessel. The microwire was utilized to measure appropriate catheter length. A subcutaneous port pocket was then created along the upper chest wall utilizing a combination of sharp and blunt dissection. The pocket was irrigated with sterile saline. A single lumen power injectable port was chosen for placement. The 8 Fr catheter was tunneled from the port pocket site to the venotomy incision. The port was placed in the pocket. The external catheter was trimmed to appropriate length. At the venotomy, an 8 Fr peel-away sheath was placed over a guidewire under fluoroscopic guidance. The catheter was then placed through the sheath and the sheath was removed. Final catheter positioning was confirmed and documented with a fluoroscopic spot radiograph. The port was accessed with a Huber needle, aspirated and flushed with heparinized saline. The venotomy site was closed with an interrupted 4-0 Vicryl suture. The port pocket incision was closed with interrupted 2-0 Vicryl suture and the skin was opposed with a running subcuticular 4-0 Vicryl suture. Dermabond and Steri-strips were applied to both incisions. Dressings were placed. The patient tolerated the procedure well without immediate post procedural complication. FINDINGS: After catheter placement, the tip lies within the superior cavoatrial junction. The catheter aspirates and flushes normally and is ready for immediate use. IMPRESSION: Successful placement of a right internal jugular approach power injectable Port-A-Cath. The catheter is ready for immediate use. Electronically Signed   By: Sandi Mariscal M.D.   On: 07/20/2019 13:30     ASSESSMENT & PLAN:   Cancer of right colon ([place]) #Right-sided colon adenocarcinoma-with synchronous metastasis to liver/unresectable. plan FOLFOX plus Avastin therapy.  #Proceed with FOLFOX only today CBC CMP are reviewed; adequate today; no contraindications to chemotherapy. Proceed with treatment today.  Hold Avastin recent port placement.  #Again reviewed the side effects of chemo -including tingling and numbness/cold sensitivity with oxaliplatin.  # DISPOSITION: # FOLFOX only today. # follow up in 2 weeks;MD; labs- cbc/cmp/UA/ CEA; FOLFOX + avastin- Dr.B     All questions were answered. The patient knows to call the clinic with any problems, questions or concerns.    Cammie Sickle, MD 07/30/2019 9:04 PM

## 2019-07-24 ENCOUNTER — Inpatient Hospital Stay: Payer: Medicare HMO

## 2019-07-24 ENCOUNTER — Inpatient Hospital Stay: Payer: Medicare HMO | Attending: Internal Medicine | Admitting: Internal Medicine

## 2019-07-24 ENCOUNTER — Other Ambulatory Visit: Payer: Self-pay

## 2019-07-24 DIAGNOSIS — C787 Secondary malignant neoplasm of liver and intrahepatic bile duct: Secondary | ICD-10-CM | POA: Diagnosis not present

## 2019-07-24 DIAGNOSIS — Z7189 Other specified counseling: Secondary | ICD-10-CM

## 2019-07-24 DIAGNOSIS — Z452 Encounter for adjustment and management of vascular access device: Secondary | ICD-10-CM | POA: Diagnosis not present

## 2019-07-24 DIAGNOSIS — Z5112 Encounter for antineoplastic immunotherapy: Secondary | ICD-10-CM | POA: Insufficient documentation

## 2019-07-24 DIAGNOSIS — Z5111 Encounter for antineoplastic chemotherapy: Secondary | ICD-10-CM | POA: Diagnosis not present

## 2019-07-24 DIAGNOSIS — C182 Malignant neoplasm of ascending colon: Secondary | ICD-10-CM

## 2019-07-24 LAB — COMPREHENSIVE METABOLIC PANEL
ALT: 15 U/L (ref 0–44)
AST: 21 U/L (ref 15–41)
Albumin: 3.6 g/dL (ref 3.5–5.0)
Alkaline Phosphatase: 79 U/L (ref 38–126)
Anion gap: 10 (ref 5–15)
BUN: 19 mg/dL (ref 8–23)
CO2: 24 mmol/L (ref 22–32)
Calcium: 9 mg/dL (ref 8.9–10.3)
Chloride: 106 mmol/L (ref 98–111)
Creatinine, Ser: 0.8 mg/dL (ref 0.61–1.24)
GFR calc Af Amer: >60 mL/min (ref >60)
GFR calc non Af Amer: >60 mL/min (ref >60)
Glucose, Bld: 180 mg/dL — ABNORMAL HIGH (ref 70–99)
Potassium: 3.7 mmol/L (ref 3.5–5.1)
Sodium: 140 mmol/L (ref 135–145)
Total Bilirubin: 0.5 mg/dL (ref 0.3–1.2)
Total Protein: 7.1 g/dL (ref 6.5–8.1)

## 2019-07-24 LAB — URINALYSIS, COMPLETE (UACMP) WITH MICROSCOPIC
Bacteria, UA: NONE SEEN
Bilirubin Urine: NEGATIVE
Glucose, UA: NEGATIVE mg/dL
Hgb urine dipstick: NEGATIVE
Ketones, ur: NEGATIVE mg/dL
Leukocytes,Ua: NEGATIVE
Nitrite: NEGATIVE
Protein, ur: NEGATIVE mg/dL
Specific Gravity, Urine: 1.024 (ref 1.005–1.030)
pH: 5 (ref 5.0–8.0)

## 2019-07-24 LAB — CBC WITH DIFFERENTIAL/PLATELET
Abs Immature Granulocytes: 0.02 10*3/uL (ref 0.00–0.07)
Basophils Absolute: 0 10*3/uL (ref 0.0–0.1)
Basophils Relative: 0 %
Eosinophils Absolute: 0.7 10*3/uL — ABNORMAL HIGH (ref 0.0–0.5)
Eosinophils Relative: 11 %
HCT: 36.8 % — ABNORMAL LOW (ref 39.0–52.0)
Hemoglobin: 12.2 g/dL — ABNORMAL LOW (ref 13.0–17.0)
Immature Granulocytes: 0 %
Lymphocytes Relative: 29 %
Lymphs Abs: 2 10*3/uL (ref 0.7–4.0)
MCH: 30.4 pg (ref 26.0–34.0)
MCHC: 33.2 g/dL (ref 30.0–36.0)
MCV: 91.8 fL (ref 80.0–100.0)
Monocytes Absolute: 0.6 10*3/uL (ref 0.1–1.0)
Monocytes Relative: 9 %
Neutro Abs: 3.3 10*3/uL (ref 1.7–7.7)
Neutrophils Relative %: 51 %
Platelets: 215 10*3/uL (ref 150–400)
RBC: 4.01 MIL/uL — ABNORMAL LOW (ref 4.22–5.81)
RDW: 12.1 % (ref 11.5–15.5)
WBC: 6.7 10*3/uL (ref 4.0–10.5)
nRBC: 0 % (ref 0.0–0.2)

## 2019-07-24 MED ORDER — OXALIPLATIN CHEMO INJECTION 100 MG/20ML
85.0000 mg/m2 | Freq: Once | INTRAVENOUS | Status: AC
  Administered 2019-07-24: 10:00:00 180 mg via INTRAVENOUS
  Filled 2019-07-24: qty 36

## 2019-07-24 MED ORDER — PALONOSETRON HCL INJECTION 0.25 MG/5ML
0.2500 mg | Freq: Once | INTRAVENOUS | Status: AC
  Administered 2019-07-24: 09:00:00 0.25 mg via INTRAVENOUS
  Filled 2019-07-24: qty 5

## 2019-07-24 MED ORDER — SODIUM CHLORIDE 0.9 % IV SOLN: 10.0000 mg | Freq: Once | INTRAVENOUS | Status: DC

## 2019-07-24 MED ORDER — SODIUM CHLORIDE 0.9 % IV SOLN
Freq: Once | INTRAVENOUS | Status: AC
  Administered 2019-07-24: 09:00:00 via INTRAVENOUS
  Filled 2019-07-24: qty 250

## 2019-07-24 MED ORDER — LEUCOVORIN CALCIUM INJECTION 350 MG
800.0000 mg | Freq: Once | INTRAVENOUS | Status: AC
  Administered 2019-07-24: 800 mg via INTRAVENOUS
  Filled 2019-07-24: qty 40

## 2019-07-24 MED ORDER — DEXAMETHASONE SODIUM PHOSPHATE 10 MG/ML IJ SOLN
10.0000 mg | Freq: Once | INTRAMUSCULAR | Status: AC
  Administered 2019-07-24: 09:00:00 10 mg via INTRAVENOUS
  Filled 2019-07-24: qty 1

## 2019-07-24 MED ORDER — SODIUM CHLORIDE 0.9 % IV SOLN: 5.0000 mg/kg | Freq: Once | INTRAVENOUS | Status: DC

## 2019-07-24 MED ORDER — SODIUM CHLORIDE 0.9 % IV SOLN
5000.0000 mg | INTRAVENOUS | Status: DC
  Administered 2019-07-24: 12:00:00 5000 mg via INTRAVENOUS
  Filled 2019-07-24: qty 100

## 2019-07-24 MED ORDER — SODIUM CHLORIDE 0.9% FLUSH
10.0000 mL | Freq: Once | INTRAVENOUS | Status: AC
  Administered 2019-07-24: 10 mL via INTRAVENOUS
  Filled 2019-07-24: qty 10

## 2019-07-24 MED ORDER — DEXTROSE 5 % IV SOLN
Freq: Once | INTRAVENOUS | Status: AC
  Administered 2019-07-24: 10:00:00 via INTRAVENOUS
  Filled 2019-07-24: qty 250

## 2019-07-24 NOTE — Progress Notes (Signed)
Per Dr. Rogue Bussing okay to proceed without CMP results at this time, okay to proceed with scheduled treatment with BMP from 07/20/2019.  Pt had port placed 07/20/2019, per Br. Brahmanday hold Avastin today, pt aware and agrees with plan.   [id number]: 07/24/2019 CMP resulted.  1210: Pt tolerated infusion well. Discharge instruction reviewed with pt, pt educated on home infusion pump, and what to exspect and what to do if a chemo leak occurs. Pt verbalizes understanding. Pt stable at discharge.

## 2019-07-25 ENCOUNTER — Telehealth: Payer: Self-pay

## 2019-07-25 LAB — CEA: CEA: 19.9 ng/mL — ABNORMAL HIGH (ref 0.0–4.7)

## 2019-07-25 NOTE — Telephone Encounter (Signed)
Telephone call to patient after receiving first infusion yesterday.   Patient states infusion went great and has not had any "cold sensitivity" yet.   Patient states feeling "great" and eating and drinking fluids well.  Encouraged patient to call for any questions or concerns.

## 2019-07-26 ENCOUNTER — Inpatient Hospital Stay: Payer: Medicare HMO

## 2019-07-26 ENCOUNTER — Other Ambulatory Visit: Payer: Self-pay

## 2019-07-26 VITALS — BP 144/81 | HR 78 | Temp 97.0°F | Resp 19

## 2019-07-26 DIAGNOSIS — Z5111 Encounter for antineoplastic chemotherapy: Secondary | ICD-10-CM | POA: Diagnosis not present

## 2019-07-26 DIAGNOSIS — C182 Malignant neoplasm of ascending colon: Secondary | ICD-10-CM

## 2019-07-26 DIAGNOSIS — Z452 Encounter for adjustment and management of vascular access device: Secondary | ICD-10-CM | POA: Diagnosis not present

## 2019-07-26 DIAGNOSIS — Z5112 Encounter for antineoplastic immunotherapy: Secondary | ICD-10-CM | POA: Diagnosis not present

## 2019-07-26 DIAGNOSIS — Z7189 Other specified counseling: Secondary | ICD-10-CM

## 2019-07-26 DIAGNOSIS — C787 Secondary malignant neoplasm of liver and intrahepatic bile duct: Secondary | ICD-10-CM | POA: Diagnosis not present

## 2019-07-26 MED ORDER — SODIUM CHLORIDE 0.9% FLUSH
10.0000 mL | INTRAVENOUS | Status: DC | PRN
  Administered 2019-07-26: 10 mL
  Filled 2019-07-26: qty 10

## 2019-07-26 MED ORDER — HEPARIN SOD (PORK) LOCK FLUSH 100 UNIT/ML IV SOLN
500.0000 [IU] | Freq: Once | INTRAVENOUS | Status: AC | PRN
  Administered 2019-07-26: 500 [IU]
  Filled 2019-07-26: qty 5

## 2019-08-07 ENCOUNTER — Inpatient Hospital Stay: Payer: Medicare HMO

## 2019-08-07 ENCOUNTER — Inpatient Hospital Stay (HOSPITAL_BASED_OUTPATIENT_CLINIC_OR_DEPARTMENT_OTHER): Payer: Medicare HMO | Admitting: Nurse Practitioner

## 2019-08-07 ENCOUNTER — Other Ambulatory Visit: Payer: Self-pay

## 2019-08-07 ENCOUNTER — Encounter: Payer: Self-pay | Admitting: Nurse Practitioner

## 2019-08-07 ENCOUNTER — Other Ambulatory Visit: Payer: Self-pay | Admitting: Oncology

## 2019-08-07 VITALS — BP 146/72 | HR 72 | Temp 97.5°F | Resp 20 | Ht 72.0 in | Wt 187.0 lb

## 2019-08-07 DIAGNOSIS — Z5112 Encounter for antineoplastic immunotherapy: Secondary | ICD-10-CM | POA: Diagnosis not present

## 2019-08-07 DIAGNOSIS — Z452 Encounter for adjustment and management of vascular access device: Secondary | ICD-10-CM | POA: Diagnosis not present

## 2019-08-07 DIAGNOSIS — C182 Malignant neoplasm of ascending colon: Secondary | ICD-10-CM

## 2019-08-07 DIAGNOSIS — C787 Secondary malignant neoplasm of liver and intrahepatic bile duct: Secondary | ICD-10-CM | POA: Diagnosis not present

## 2019-08-07 DIAGNOSIS — Z7189 Other specified counseling: Secondary | ICD-10-CM

## 2019-08-07 DIAGNOSIS — Z5111 Encounter for antineoplastic chemotherapy: Secondary | ICD-10-CM | POA: Diagnosis not present

## 2019-08-07 LAB — URINALYSIS, COMPLETE (UACMP) WITH MICROSCOPIC
Bacteria, UA: NONE SEEN
Bilirubin Urine: NEGATIVE
Glucose, UA: NEGATIVE mg/dL
Hgb urine dipstick: NEGATIVE
Ketones, ur: NEGATIVE mg/dL
Leukocytes,Ua: NEGATIVE
Nitrite: NEGATIVE
Protein, ur: 30 mg/dL — AB
Specific Gravity, Urine: 1.03 (ref 1.005–1.030)
pH: 5 (ref 5.0–8.0)

## 2019-08-07 LAB — CBC WITH DIFFERENTIAL/PLATELET
Abs Immature Granulocytes: 0.01 10*3/uL (ref 0.00–0.07)
Basophils Absolute: 0 10*3/uL (ref 0.0–0.1)
Basophils Relative: 0 %
Eosinophils Absolute: 0.5 10*3/uL (ref 0.0–0.5)
Eosinophils Relative: 7 %
HCT: 34.9 % — ABNORMAL LOW (ref 39.0–52.0)
Hemoglobin: 11.7 g/dL — ABNORMAL LOW (ref 13.0–17.0)
Immature Granulocytes: 0 %
Lymphocytes Relative: 23 %
Lymphs Abs: 1.5 10*3/uL (ref 0.7–4.0)
MCH: 30.6 pg (ref 26.0–34.0)
MCHC: 33.5 g/dL (ref 30.0–36.0)
MCV: 91.4 fL (ref 80.0–100.0)
Monocytes Absolute: 0.7 10*3/uL (ref 0.1–1.0)
Monocytes Relative: 10 %
Neutro Abs: 3.8 10*3/uL (ref 1.7–7.7)
Neutrophils Relative %: 60 %
Platelets: 229 10*3/uL (ref 150–400)
RBC: 3.82 MIL/uL — ABNORMAL LOW (ref 4.22–5.81)
RDW: 12 % (ref 11.5–15.5)
WBC: 6.4 10*3/uL (ref 4.0–10.5)
nRBC: 0 % (ref 0.0–0.2)

## 2019-08-07 LAB — COMPREHENSIVE METABOLIC PANEL
ALT: 15 U/L (ref 0–44)
AST: 18 U/L (ref 15–41)
Albumin: 3.1 g/dL — ABNORMAL LOW (ref 3.5–5.0)
Alkaline Phosphatase: 93 U/L (ref 38–126)
Anion gap: 4 — ABNORMAL LOW (ref 5–15)
BUN: 14 mg/dL (ref 8–23)
CO2: 25 mmol/L (ref 22–32)
Calcium: 8.8 mg/dL — ABNORMAL LOW (ref 8.9–10.3)
Chloride: 110 mmol/L (ref 98–111)
Creatinine, Ser: 0.73 mg/dL (ref 0.61–1.24)
GFR calc Af Amer: >60 mL/min (ref >60)
GFR calc non Af Amer: >60 mL/min (ref >60)
Glucose, Bld: 152 mg/dL — ABNORMAL HIGH (ref 70–99)
Potassium: 3.8 mmol/L (ref 3.5–5.1)
Sodium: 139 mmol/L (ref 135–145)
Total Bilirubin: 0.6 mg/dL (ref 0.3–1.2)
Total Protein: 6.7 g/dL (ref 6.5–8.1)

## 2019-08-07 MED ORDER — SODIUM CHLORIDE 0.9 % IV SOLN
INTRAVENOUS | Status: DC
  Administered 2019-08-07: 10:00:00 via INTRAVENOUS
  Filled 2019-08-07: qty 250

## 2019-08-07 MED ORDER — DEXAMETHASONE SODIUM PHOSPHATE 10 MG/ML IJ SOLN
10.0000 mg | Freq: Once | INTRAMUSCULAR | Status: AC
  Administered 2019-08-07: 10 mg via INTRAVENOUS
  Filled 2019-08-07: qty 1

## 2019-08-07 MED ORDER — SODIUM CHLORIDE 0.9 % IV SOLN
5000.0000 mg | INTRAVENOUS | Status: DC
  Administered 2019-08-07: 5000 mg via INTRAVENOUS
  Filled 2019-08-07: qty 100

## 2019-08-07 MED ORDER — DEXTROSE 5 % IV SOLN
Freq: Once | INTRAVENOUS | Status: AC
  Administered 2019-08-07: 11:00:00 via INTRAVENOUS
  Filled 2019-08-07: qty 250

## 2019-08-07 MED ORDER — PALONOSETRON HCL INJECTION 0.25 MG/5ML
0.2500 mg | Freq: Once | INTRAVENOUS | Status: AC
  Administered 2019-08-07: 0.25 mg via INTRAVENOUS
  Filled 2019-08-07: qty 5

## 2019-08-07 MED ORDER — SODIUM CHLORIDE 0.9 % IV SOLN
5.0000 mg/kg | Freq: Once | INTRAVENOUS | Status: AC
  Administered 2019-08-07: 400 mg via INTRAVENOUS
  Filled 2019-08-07: qty 16

## 2019-08-07 MED ORDER — LEUCOVORIN CALCIUM INJECTION 350 MG
800.0000 mg | Freq: Once | INTRAVENOUS | Status: AC
  Administered 2019-08-07: 800 mg via INTRAVENOUS
  Filled 2019-08-07: qty 40

## 2019-08-07 MED ORDER — OXALIPLATIN CHEMO INJECTION 100 MG/20ML
85.0000 mg/m2 | Freq: Once | INTRAVENOUS | Status: AC
  Administered 2019-08-07: 180 mg via INTRAVENOUS
  Filled 2019-08-07: qty 36

## 2019-08-07 NOTE — Assessment & Plan Note (Signed)
#  Right-sided Colon adenocarcinoma  -with synchronous metastasis to the liver/unresectable.  Currently status post cycle 1 of FOLFOX plus Avastin.  Avastin held with cycle 1 due to port placement.  Tolerated cycle 1 well without significant side effects.  #Labs reviewed today including CBC and CMP.  UA and CEA pending.  Acceptable for treatment.  Again reviewed the side effects of chemotherapy including those of bevacizumab.  Discussed cold sensitivity, numbness and tingling with oxaliplatin.  Patient verbalizes understanding and wishes to proceed with treatment today.  #Proceed with day 1 cycle 2 of FOLFOX plus Avastin chemotherapy  # DISPOSITION: # FOLFOX + Avastin today # follow up in 2 weeks; MD; labs- cbc/cmp/UA/ CEA; FOLFOX + avastin- [facility]

## 2019-08-07 NOTE — Progress Notes (Signed)
[facility] PROGRESS NOTE  Patient Care Team: Sofie Hartigan, MD as PCP - General (Family Medicine)  CHIEF COMPLAINTS/PURPOSE OF CONSULTATION: Colon cancer  #  Oncology History Overview Note  # MAY 2020- 3. 03/09/19 Liver biopsy. Microscopic examination shows malignant cells with glandular architecture consistent with adenocarcinoma. The malignant cells are positive for CK20 and CDX-2. These findings support the clinical impression of metastatic colon adenocarcinoma. 4. 03/10/19 R hemicolectomy. Tumor site cecum. Adenocarcinoma. Mucinous features present. G2. No tumor deposits. Invades visceral peritoneum. No tumor perforation. LVI present. PNI not identified. All margins uninvolved. 1/12 LNs. PT4apN1. Periappendiceal inflammation c/w resolving abscess. Microsatellite stable (MSS). [Dr.Mettu; [facility]]  # SEP 4th 2020 [compared to May 2020]  Interval increase in size of the metastases to the hepatic dome, The metastasis to the left hepatic lobe is unchanged; 2.  New subcentimeter hypoattenuating lesion in the inferior right hepatic lobe, incompletely characterized on CT. 3.  Postsurgical changes following right hemicolectomy. # NGS-Pending  DIAGNOSIS: COLON CANCER  STAGE:  IV       ;  GOALS:Palliative  CURRENT/MOST RECENT THERAPY : FOLFOX    Cancer of right colon ([place])  07/05/2019 Initial Diagnosis   Cancer of right colon ([place])   07/24/2019 -  Chemotherapy   The patient had palonosetron (ALOXI) injection 0.25 mg, 0.25 mg, Intravenous,  Once, 1 of 4 cycles Administration: 0.25 mg (07/24/2019) leucovorin 800 mg in dextrose 5 % 250 mL infusion, 844 mg, Intravenous,  Once, 1 of 4 cycles Administration: 800 mg (07/24/2019) oxaliplatin (ELOXATIN) 180 mg in dextrose 5 % 500 mL chemo infusion, 85 mg/m2 = 180 mg, Intravenous,  Once, 1 of 4 cycles Administration: 180 mg (07/24/2019) fluorouracil (ADRUCIL) 5,000 mg in sodium chloride 0.9 % 150 mL chemo infusion, 5,050 mg, Intravenous, 1  Day/Dose, 1 of 4 cycles Administration: 5,000 mg (07/24/2019) bevacizumab-bvzr (ZIRABEV) 400 mg in sodium chloride 0.9 % 100 mL chemo infusion, 5 mg/kg = 400 mg, Intravenous,  Once, 1 of 4 cycles  for chemotherapy treatment.      HISTORY OF PRESENTING ILLNESS:  Oscar [name] 78 y.o.  male with metastatic colon cancer to the liver, who returns to clinic today for further evaluation and consideration of cycle 2 of FOLFOX chemotherapy with Avastin he underwent cycle 1 day 1 of FOLFOX chemotherapy on 07/24/2019.  Tolerated it well without significant side effects.  Says he is eating and drinking well.  Played golf this weekend without significant fatigue.  Denies fever or recent illness.  No chills.  No nausea or vomiting.  Reports normal bowel movements without constipation or diarrhea.  Denies easy bleeding or bruising.  Denies urinary complaints.  Denies cough or shortness of breath.  Denies chest pain or palpitations.  Has chronic back and joint pain which is no worse.  Denies skin changes.  Sleeping well.  Continues to care for his wife with dementia.   Review of Systems  Constitutional: Negative for chills, diaphoresis, fever, malaise/fatigue and weight loss.  HENT: Negative for nosebleeds and sore throat.   Eyes: Negative for double vision.  Respiratory: Negative for cough, hemoptysis, sputum production, shortness of breath and wheezing.   Cardiovascular: Negative for chest pain, palpitations, orthopnea and leg swelling.  Gastrointestinal: Negative for abdominal pain, blood in stool, constipation, diarrhea, heartburn, melena, nausea and vomiting.  Genitourinary: Negative for dysuria, frequency and urgency.  Musculoskeletal: Positive for back pain (Chronic) and joint pain (Chronic). Negative for falls.  Skin: Negative.  Negative for itching and rash.  Neurological: Negative for dizziness, tingling, focal weakness, weakness and headaches.  Endo/Heme/Allergies: Does not bruise/bleed easily.   Psychiatric/Behavioral: Negative for depression. The patient is not nervous/anxious and does not have insomnia.      MEDICAL HISTORY:  Past Medical History:  Diagnosis Date  . Arthritis   . Diabetes mellitus without complication ([place])   . H/O colon cancer, stage IV   . Hyperlipemia   . Hypertension     SURGICAL HISTORY: Past Surgical History:  Procedure Laterality Date  . IR IMAGING GUIDED PORT INSERTION  07/20/2019  . JOINT REPLACEMENT      SOCIAL HISTORY: Social History   Socioeconomic History  . Marital status: Married    Spouse name: Not on file  . Number of children: Not on file  . Years of education: Not on file  . Highest education level: Not on file  Occupational History  . Not on file  Social Needs  . Financial resource strain: Not on file  . Food insecurity    Worry: Not on file    Inability: Not on file  . Transportation needs    Medical: Not on file    Non-medical: Not on file  Tobacco Use  . Smoking status: Current Every Day Smoker    Packs/day: 0.25    Types: Cigarettes  . Smokeless tobacco: Never Used  . Tobacco comment: 1 to 2 cigarettes a day occasionally  Substance and Sexual Activity  . Alcohol use: No  . Drug use: No  . Sexual activity: Not on file  Lifestyle  . Physical activity    Days per week: Not on file    Minutes per session: Not on file  . Stress: Not on file  Relationships  . Social Herbalist on phone: Not on file    Gets together: Not on file    Attends religious service: Not on file    Active member of club or organization: Not on file    Attends meetings of clubs or organizations: Not on file    Relationship status: Not on file  . Intimate partner violence    Fear of current or ex partner: Not on file    Emotionally abused: Not on file    Physically abused: Not on file    Forced sexual activity: Not on file  Other Topics Concern  . Not on file  Social History Narrative   Recruitment consultant retd; lives  in [place]; smoking 3cig/day; [3/4 ppd x started at 9 years]; no alcohol. Son & daughter; wife dementia [waiting for placement].     FAMILY HISTORY: Family History  Problem Relation Age of Onset  . Peptic Ulcer Disease Father     ALLERGIES:  is allergic to ace inhibitors.  MEDICATIONS:  Current Outpatient Medications  Medication Sig Dispense Refill  . amLODipine (NORVASC) 5 MG tablet Take 5 mg by mouth daily.    . fluticasone (FLONASE) 50 MCG/ACT nasal spray Place into the nose.    . lidocaine-prilocaine (EMLA) cream Apply 1 application topically as needed. 30 g 0  . ondansetron (ZOFRAN-ODT) 4 MG disintegrating tablet Take 1 tablet (4 mg total) by mouth every 6 (six) hours as needed for nausea. 20 tablet 0  . pravastatin (PRAVACHOL) 40 MG tablet Take 40 mg by mouth daily.    . prochlorperazine (COMPAZINE) 10 MG tablet Take 1 tablet (10 mg total) by mouth every 6 (six) hours as needed for nausea or vomiting. 40 tablet 1  . tamsulosin (FLOMAX)  0.4 MG CAPS capsule Take 1 capsule by mouth daily.     No current facility-administered medications for this visit.     PHYSICAL EXAMINATION: ECOG PERFORMANCE STATUS: 0 - Asymptomatic  Vitals:   08/07/19 0828  BP: (!) 146/72  Pulse: 72  Resp: 20  Temp: (!) 97.5 F (36.4 C)   Filed Weights   08/07/19 0828  Weight: 187 lb (84.8 kg)    Physical Exam  Constitutional: He is oriented to person, place, and time and well-developed, well-nourished, and in no distress.  Wearing mask  HENT:  Head: Normocephalic and atraumatic.  Mouth/Throat: Oropharynx is clear and moist. No oropharyngeal exudate.  Eyes: Pupils are equal, round, and reactive to light. Conjunctivae are normal. No scleral icterus.  Neck: Normal range of motion. Neck supple.  Cardiovascular: Normal rate and regular rhythm.  Pulmonary/Chest: No respiratory distress. He has no wheezes.  Abdominal: Soft. Bowel sounds are normal. He exhibits no distension and no mass.  There is no abdominal tenderness. There is no rebound and no guarding.  Musculoskeletal: Normal range of motion.        General: No tenderness or edema.  Lymphadenopathy:    He has no cervical adenopathy.  Neurological: He is alert and oriented to person, place, and time.  Skin: Skin is warm and dry.  Psychiatric: Mood and affect normal.   LABORATORY DATA:  I have reviewed the data as listed Lab Results  Component Value Date   WBC 6.4 08/07/2019   HGB 11.7 (L) 08/07/2019   HCT 34.9 (L) 08/07/2019   MCV 91.4 08/07/2019   PLT 229 08/07/2019   Recent Labs    07/05/19 1605 07/20/19 1036 07/24/19 0813 08/07/19 0808  NA 139 140 140 139  K 4.0 3.9 3.7 3.8  CL 108 108 106 110  CO2 '26 23 24 25  '$ GLUCOSE 112* 122* 180* 152*  BUN '11 15 19 14  '$ CREATININE 0.61 0.61 0.80 0.73  CALCIUM 9.0 9.3 9.0 8.8*  GFRNONAA >60 >60 >60 >60  GFRAA >60 >60 >60 >60  PROT 7.6  --  7.1 6.7  ALBUMIN 3.9  --  3.6 3.1*  AST 18  --  21 18  ALT 15  --  15 15  ALKPHOS 95  --  79 93  BILITOT 0.4  --  0.5 0.6    RADIOGRAPHIC STUDIES: I have personally reviewed the radiological images as listed and agreed with the findings in the report. Ir Imaging Guided Port Insertion  Result Date: 07/20/2019 INDICATION: History of metastatic colon cancer. In need of durable intravenous access for chemotherapy administration. EXAM: IMPLANTED PORT A CATH PLACEMENT WITH ULTRASOUND AND FLUOROSCOPIC GUIDANCE COMPARISON:  None. MEDICATIONS: Ancef 2 g IV;; The antibiotic was administered within an appropriate time interval prior to skin puncture. ANESTHESIA/SEDATION: Moderate (conscious) sedation was employed during this procedure. A total of Versed 2 mg and Fentanyl 100 mcg was administered intravenously. Moderate Sedation Time: 21 minutes. The patient's level of consciousness and vital signs were monitored continuously by radiology nursing throughout the procedure under my direct supervision. CONTRAST:  None FLUOROSCOPY TIME:  36  seconds (78.6 mGy) COMPLICATIONS: None immediate. PROCEDURE: The procedure, risks, benefits, and alternatives were explained to the patient. Questions regarding the procedure were encouraged and answered. The patient understands and consents to the procedure. The right neck and chest were prepped with chlorhexidine in a sterile fashion, and a sterile drape was applied covering the operative field. Maximum barrier sterile technique with sterile gowns and gloves  were used for the procedure. A timeout was performed prior to the initiation of the procedure. Local anesthesia was provided with 1% lidocaine with epinephrine. After creating a small venotomy incision, a micropuncture kit was utilized to access the internal jugular vein. Real-time ultrasound guidance was utilized for vascular access including the acquisition of a permanent ultrasound image documenting patency of the accessed vessel. The microwire was utilized to measure appropriate catheter length. A subcutaneous port pocket was then created along the upper chest wall utilizing a combination of sharp and blunt dissection. The pocket was irrigated with sterile saline. A single lumen power injectable port was chosen for placement. The 8 Fr catheter was tunneled from the port pocket site to the venotomy incision. The port was placed in the pocket. The external catheter was trimmed to appropriate length. At the venotomy, an 8 Fr peel-away sheath was placed over a guidewire under fluoroscopic guidance. The catheter was then placed through the sheath and the sheath was removed. Final catheter positioning was confirmed and documented with a fluoroscopic spot radiograph. The port was accessed with a Huber needle, aspirated and flushed with heparinized saline. The venotomy site was closed with an interrupted 4-0 Vicryl suture. The port pocket incision was closed with interrupted 2-0 Vicryl suture and the skin was opposed with a running subcuticular 4-0 Vicryl suture.  Dermabond and Steri-strips were applied to both incisions. Dressings were placed. The patient tolerated the procedure well without immediate post procedural complication. FINDINGS: After catheter placement, the tip lies within the superior cavoatrial junction. The catheter aspirates and flushes normally and is ready for immediate use. IMPRESSION: Successful placement of a right internal jugular approach power injectable Port-A-Cath. The catheter is ready for immediate use. Electronically Signed   By: Sandi Mariscal M.D.   On: 07/20/2019 13:30    ASSESSMENT & PLAN:   Cancer of right colon ([facility]) #Right-sided Colon adenocarcinoma  -with synchronous metastasis to the liver/unresectable.  Currently status post cycle 1 of FOLFOX plus Avastin.  Avastin held with cycle 1 due to port placement.  Tolerated cycle 1 well without significant side effects.  #Labs reviewed today including CBC and CMP.  UA and CEA pending.  Acceptable for treatment.  Again reviewed the side effects of chemotherapy including those of bevacizumab.  Discussed cold sensitivity, numbness and tingling with oxaliplatin.  Patient verbalizes understanding and wishes to proceed with treatment today.  #Proceed with day 1 cycle 2 of FOLFOX plus Avastin chemotherapy  # DISPOSITION: # FOLFOX + Avastin today # follow up in 2 weeks; MD; labs- cbc/cmp/UA/ CEA; FOLFOX + avastin- la  All questions were answered. The patient knows to call the clinic with any problems, questions or concerns.   Beckey Rutter, DNP, AGNP-C [facility] (work cell) [phone] (office)  CC: Dr. Rogue Bussing

## 2019-08-08 ENCOUNTER — Encounter: Payer: Self-pay | Admitting: Internal Medicine

## 2019-08-08 LAB — CEA: CEA: 20.1 ng/mL — ABNORMAL HIGH (ref 0.0–4.7)

## 2019-08-09 ENCOUNTER — Inpatient Hospital Stay: Payer: Medicare HMO

## 2019-08-09 ENCOUNTER — Other Ambulatory Visit: Payer: Self-pay

## 2019-08-09 VITALS — BP 154/81 | HR 70 | Temp 98.2°F | Resp 19

## 2019-08-09 DIAGNOSIS — Z452 Encounter for adjustment and management of vascular access device: Secondary | ICD-10-CM | POA: Diagnosis not present

## 2019-08-09 DIAGNOSIS — Z7189 Other specified counseling: Secondary | ICD-10-CM

## 2019-08-09 DIAGNOSIS — C182 Malignant neoplasm of ascending colon: Secondary | ICD-10-CM | POA: Diagnosis not present

## 2019-08-09 DIAGNOSIS — Z5112 Encounter for antineoplastic immunotherapy: Secondary | ICD-10-CM | POA: Diagnosis not present

## 2019-08-09 DIAGNOSIS — C787 Secondary malignant neoplasm of liver and intrahepatic bile duct: Secondary | ICD-10-CM | POA: Diagnosis not present

## 2019-08-09 DIAGNOSIS — Z5111 Encounter for antineoplastic chemotherapy: Secondary | ICD-10-CM | POA: Diagnosis not present

## 2019-08-09 MED ORDER — HEPARIN SOD (PORK) LOCK FLUSH 100 UNIT/ML IV SOLN
500.0000 [IU] | Freq: Once | INTRAVENOUS | Status: AC | PRN
  Administered 2019-08-09: 500 [IU]
  Filled 2019-08-09: qty 5

## 2019-08-11 ENCOUNTER — Telehealth: Payer: Self-pay

## 2019-08-11 NOTE — Telephone Encounter (Signed)
[facility] CDx has resulted and has been uploaded under the media tab for review.

## 2019-08-21 ENCOUNTER — Encounter: Payer: Self-pay | Admitting: Nurse Practitioner

## 2019-08-21 ENCOUNTER — Other Ambulatory Visit: Payer: Self-pay

## 2019-08-21 ENCOUNTER — Inpatient Hospital Stay: Payer: Medicare HMO

## 2019-08-21 ENCOUNTER — Other Ambulatory Visit: Payer: Self-pay | Admitting: Internal Medicine

## 2019-08-21 ENCOUNTER — Inpatient Hospital Stay (HOSPITAL_BASED_OUTPATIENT_CLINIC_OR_DEPARTMENT_OTHER): Payer: Medicare HMO | Admitting: Nurse Practitioner

## 2019-08-21 ENCOUNTER — Inpatient Hospital Stay: Payer: Medicare HMO | Attending: Nurse Practitioner

## 2019-08-21 VITALS — Resp 82

## 2019-08-21 DIAGNOSIS — Z5112 Encounter for antineoplastic immunotherapy: Secondary | ICD-10-CM | POA: Diagnosis not present

## 2019-08-21 DIAGNOSIS — C182 Malignant neoplasm of ascending colon: Secondary | ICD-10-CM

## 2019-08-21 DIAGNOSIS — C787 Secondary malignant neoplasm of liver and intrahepatic bile duct: Secondary | ICD-10-CM | POA: Insufficient documentation

## 2019-08-21 DIAGNOSIS — Z452 Encounter for adjustment and management of vascular access device: Secondary | ICD-10-CM | POA: Diagnosis not present

## 2019-08-21 DIAGNOSIS — Z5111 Encounter for antineoplastic chemotherapy: Secondary | ICD-10-CM | POA: Diagnosis not present

## 2019-08-21 DIAGNOSIS — Z7189 Other specified counseling: Secondary | ICD-10-CM

## 2019-08-21 LAB — URINALYSIS, COMPLETE (UACMP) WITH MICROSCOPIC
Bacteria, UA: NONE SEEN
Bilirubin Urine: NEGATIVE
Glucose, UA: NEGATIVE mg/dL
Hgb urine dipstick: NEGATIVE
Ketones, ur: 5 mg/dL — AB
Leukocytes,Ua: NEGATIVE
Nitrite: NEGATIVE
Protein, ur: NEGATIVE mg/dL
Specific Gravity, Urine: 1.024 (ref 1.005–1.030)
pH: 5 (ref 5.0–8.0)

## 2019-08-21 LAB — CBC WITH DIFFERENTIAL/PLATELET
Abs Immature Granulocytes: 0.02 10*3/uL (ref 0.00–0.07)
Basophils Absolute: 0.1 10*3/uL (ref 0.0–0.1)
Basophils Relative: 1 %
Eosinophils Absolute: 0.6 10*3/uL — ABNORMAL HIGH (ref 0.0–0.5)
Eosinophils Relative: 8 %
HCT: 37.6 % — ABNORMAL LOW (ref 39.0–52.0)
Hemoglobin: 12.6 g/dL — ABNORMAL LOW (ref 13.0–17.0)
Immature Granulocytes: 0 %
Lymphocytes Relative: 26 %
Lymphs Abs: 1.9 10*3/uL (ref 0.7–4.0)
MCH: 30.7 pg (ref 26.0–34.0)
MCHC: 33.5 g/dL (ref 30.0–36.0)
MCV: 91.5 fL (ref 80.0–100.0)
Monocytes Absolute: 0.9 10*3/uL (ref 0.1–1.0)
Monocytes Relative: 12 %
Neutro Abs: 3.8 10*3/uL (ref 1.7–7.7)
Neutrophils Relative %: 53 %
Platelets: 211 10*3/uL (ref 150–400)
RBC: 4.11 MIL/uL — ABNORMAL LOW (ref 4.22–5.81)
RDW: 13.1 % (ref 11.5–15.5)
WBC: 7.2 10*3/uL (ref 4.0–10.5)
nRBC: 0 % (ref 0.0–0.2)

## 2019-08-21 LAB — COMPREHENSIVE METABOLIC PANEL
ALT: 16 U/L (ref 0–44)
AST: 19 U/L (ref 15–41)
Albumin: 3.7 g/dL (ref 3.5–5.0)
Alkaline Phosphatase: 100 U/L (ref 38–126)
Anion gap: 7 (ref 5–15)
BUN: 10 mg/dL (ref 8–23)
CO2: 24 mmol/L (ref 22–32)
Calcium: 8.7 mg/dL — ABNORMAL LOW (ref 8.9–10.3)
Chloride: 108 mmol/L (ref 98–111)
Creatinine, Ser: 0.69 mg/dL (ref 0.61–1.24)
GFR calc Af Amer: >60 mL/min (ref >60)
GFR calc non Af Amer: >60 mL/min (ref >60)
Glucose, Bld: 111 mg/dL — ABNORMAL HIGH (ref 70–99)
Potassium: 3.9 mmol/L (ref 3.5–5.1)
Sodium: 139 mmol/L (ref 135–145)
Total Bilirubin: 0.4 mg/dL (ref 0.3–1.2)
Total Protein: 6.8 g/dL (ref 6.5–8.1)

## 2019-08-21 MED ORDER — SODIUM CHLORIDE 0.9% FLUSH
10.0000 mL | INTRAVENOUS | Status: DC | PRN
  Administered 2019-08-21: 10 mL via INTRAVENOUS
  Filled 2019-08-21: qty 10

## 2019-08-21 MED ORDER — SODIUM CHLORIDE 0.9 % IV SOLN
5.0000 mg/kg | Freq: Once | INTRAVENOUS | Status: AC
  Administered 2019-08-21: 400 mg via INTRAVENOUS
  Filled 2019-08-21: qty 16

## 2019-08-21 MED ORDER — SODIUM CHLORIDE 0.9 % IV SOLN
5000.0000 mg | INTRAVENOUS | Status: DC
  Administered 2019-08-21: 14:00:00 5000 mg via INTRAVENOUS
  Filled 2019-08-21: qty 100

## 2019-08-21 MED ORDER — PALONOSETRON HCL INJECTION 0.25 MG/5ML
0.2500 mg | Freq: Once | INTRAVENOUS | Status: AC
  Administered 2019-08-21: 0.25 mg via INTRAVENOUS
  Filled 2019-08-21: qty 5

## 2019-08-21 MED ORDER — LEUCOVORIN CALCIUM INJECTION 350 MG
800.0000 mg | Freq: Once | INTRAVENOUS | Status: AC
  Administered 2019-08-21: 800 mg via INTRAVENOUS
  Filled 2019-08-21: qty 25

## 2019-08-21 MED ORDER — OXALIPLATIN CHEMO INJECTION 100 MG/20ML
85.0000 mg/m2 | Freq: Once | INTRAVENOUS | Status: AC
  Administered 2019-08-21: 180 mg via INTRAVENOUS
  Filled 2019-08-21: qty 36

## 2019-08-21 MED ORDER — SODIUM CHLORIDE 0.9 % IV SOLN
INTRAVENOUS | Status: DC
  Administered 2019-08-21: 10:00:00 via INTRAVENOUS
  Filled 2019-08-21: qty 250

## 2019-08-21 MED ORDER — DEXTROSE 5 % IV SOLN
Freq: Once | INTRAVENOUS | Status: AC
  Administered 2019-08-21: 11:00:00 via INTRAVENOUS
  Filled 2019-08-21: qty 250

## 2019-08-21 MED ORDER — DEXAMETHASONE SODIUM PHOSPHATE 10 MG/ML IJ SOLN
10.0000 mg | Freq: Once | INTRAMUSCULAR | Status: AC
  Administered 2019-08-21: 10 mg via INTRAVENOUS
  Filled 2019-08-21: qty 1

## 2019-08-21 NOTE — Assessment & Plan Note (Signed)
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  Currently, status post cycle 2 of FOLFOX plus Avastin.  Avastin was held with cycle 1 due to port placement.  Tolerating treatment well without significant side effects.  #Labs today reviewed with patient and acceptable for treatment.  CEA on 08/07/2019 was 20.1. Patient verbalizes understanding and wishes to proceed with treatment today.  #Proceed with day 1, cycle 3 of FOLFOX plus Avastin chemotherapy  # DISPOSITION: #FOLFOX+ Avastin today # follow up in 2 weeks; MD; labs- cbc/cmp/UA/ CEA; FOLFOX + avastin- la

## 2019-08-21 NOTE — Progress Notes (Signed)
[name] PROGRESS NOTE  Patient Care Team: Sofie Hartigan, MD as PCP - General (Family Medicine)  CHIEF COMPLAINTS/PURPOSE OF CONSULTATION: Colon cancer  #  Oncology History Overview Note  # MAY 2020- 3. 03/09/19 Liver biopsy. Microscopic examination shows malignant cells with glandular architecture consistent with adenocarcinoma. The malignant cells are positive for CK20 and CDX-2. These findings support the clinical impression of metastatic colon adenocarcinoma. 4. 03/10/19 R hemicolectomy. Tumor site cecum. Adenocarcinoma. Mucinous features present. G2. No tumor deposits. Invades visceral peritoneum. No tumor perforation. LVI present. PNI not identified. All margins uninvolved. 1/12 LNs. PT4apN1. Periappendiceal inflammation c/w resolving abscess. Microsatellite stable (MSS). [Dr.Mettu; [facility]]  # SEP 4th 2020 [compared to May 2020]  Interval increase in size of the metastases to the hepatic dome, The metastasis to the left hepatic lobe is unchanged; 2.  New subcentimeter hypoattenuating lesion in the inferior right hepatic lobe, incompletely characterized on CT. 3.  Postsurgical changes following right hemicolectomy.  # NGS/F-ONE-MUTATED K-RAS [G]  DIAGNOSIS: COLON CANCER  STAGE:  IV       ;  GOALS:Palliative  CURRENT/MOST RECENT THERAPY : FOLFOX    Cancer of right colon ([place])  07/05/2019 Initial Diagnosis   Cancer of right colon ([place])   07/24/2019 -  Chemotherapy   The patient had palonosetron (ALOXI) injection 0.25 mg, 0.25 mg, Intravenous,  Once, 3 of 4 cycles Administration: 0.25 mg (07/24/2019), 0.25 mg (08/07/2019) leucovorin 800 mg in dextrose 5 % 250 mL infusion, 844 mg, Intravenous,  Once, 3 of 4 cycles Administration: 800 mg (07/24/2019), 800 mg (08/07/2019) oxaliplatin (ELOXATIN) 180 mg in dextrose 5 % 500 mL chemo infusion, 85 mg/m2 = 180 mg, Intravenous,  Once, 3 of 4 cycles Administration: 180 mg (07/24/2019), 180 mg (08/07/2019) fluorouracil (ADRUCIL)  5,000 mg in sodium chloride 0.9 % 150 mL chemo infusion, 5,050 mg, Intravenous, 1 Day/Dose, 3 of 4 cycles Administration: 5,000 mg (07/24/2019), 5,000 mg (08/07/2019) bevacizumab-bvzr (ZIRABEV) 400 mg in sodium chloride 0.9 % 100 mL chemo infusion, 5 mg/kg = 400 mg, Intravenous,  Once, 3 of 4 cycles Administration: 400 mg (08/07/2019)  for chemotherapy treatment.      HISTORY OF PRESENTING ILLNESS: Oscar [name] 78 y.o.  male with metastatic colon cancer to the liver, who returns to clinic today for further evaluation and consideration of cycle 3 of FOLFOX chemotherapy with Avastin.  He says that he has tolerated cycles 1 and 2 well without significant side effects.  He had mild cold sensitivity which is since resolved.  Says he is eating and drinking well.  Denies any recent fevers or illness.  No fever or chills.  No nausea or vomiting.  Normal bowel movements without constipation or diarrhea.  Denies easy bleeding or bruising.  He denies urinary complaints.  Denies cough or shortness of breath.  Denies chest pain or palpitations.  Denies skin changes.  He continues to be primary caregiver for his wife who has advanced dementia.   Review of Systems  Constitutional: Negative for chills, diaphoresis, fever, malaise/fatigue and weight loss.  HENT: Negative for nosebleeds and sore throat.   Eyes: Negative for double vision.  Respiratory: Negative for cough, hemoptysis, sputum production, shortness of breath and wheezing.   Cardiovascular: Negative for chest pain, palpitations, orthopnea and leg swelling.  Gastrointestinal: Negative for abdominal pain, blood in stool, constipation, diarrhea, heartburn, melena, nausea and vomiting.  Genitourinary: Negative for dysuria, frequency and urgency.  Musculoskeletal: Positive for back pain (Chronic) and joint pain (Chronic). Negative  for falls.  Skin: Negative.  Negative for itching and rash.  Neurological: Negative for dizziness, tingling, focal  weakness, weakness and headaches.  Endo/Heme/Allergies: Does not bruise/bleed easily.  Psychiatric/Behavioral: Negative for depression. The patient is not nervous/anxious and does not have insomnia.      MEDICAL HISTORY:  Past Medical History:  Diagnosis Date  . Arthritis   . Diabetes mellitus without complication ([place])   . H/O colon cancer, stage IV   . Hyperlipemia   . Hypertension     SURGICAL HISTORY: Past Surgical History:  Procedure Laterality Date  . IR IMAGING GUIDED PORT INSERTION  07/20/2019  . JOINT REPLACEMENT      SOCIAL HISTORY: Social History   Socioeconomic History  . Marital status: Married    Spouse name: Not on file  . Number of children: Not on file  . Years of education: Not on file  . Highest education level: Not on file  Occupational History  . Not on file  Social Needs  . Financial resource strain: Not on file  . Food insecurity    Worry: Not on file    Inability: Not on file  . Transportation needs    Medical: Not on file    Non-medical: Not on file  Tobacco Use  . Smoking status: Current Every Day Smoker    Packs/day: 0.25    Types: Cigarettes  . Smokeless tobacco: Never Used  . Tobacco comment: 1 to 2 cigarettes a day occasionally  Substance and Sexual Activity  . Alcohol use: No  . Drug use: No  . Sexual activity: Not on file  Lifestyle  . Physical activity    Days per week: Not on file    Minutes per session: Not on file  . Stress: Not on file  Relationships  . Social Herbalist on phone: Not on file    Gets together: Not on file    Attends religious service: Not on file    Active member of club or organization: Not on file    Attends meetings of clubs or organizations: Not on file    Relationship status: Not on file  . Intimate partner violence    Fear of current or ex partner: Not on file    Emotionally abused: Not on file    Physically abused: Not on file    Forced sexual activity: Not on file  Other Topics  Concern  . Not on file  Social History Narrative   Recruitment consultant retd; lives in [place]; smoking 3cig/day; [3/4 ppd x started at 9 years]; no alcohol. Son & daughter; wife dementia [waiting for placement].     FAMILY HISTORY: Family History  Problem Relation Age of Onset  . Peptic Ulcer Disease Father     ALLERGIES:  is allergic to ace inhibitors.  MEDICATIONS:  Current Outpatient Medications  Medication Sig Dispense Refill  . amLODipine (NORVASC) 5 MG tablet Take 5 mg by mouth daily.    . fluticasone (FLONASE) 50 MCG/ACT nasal spray Place into the nose.    . lidocaine-prilocaine (EMLA) cream Apply 1 application topically as needed. 30 g 0  . ondansetron (ZOFRAN-ODT) 4 MG disintegrating tablet Take 1 tablet (4 mg total) by mouth every 6 (six) hours as needed for nausea. 20 tablet 0  . pravastatin (PRAVACHOL) 40 MG tablet Take 40 mg by mouth daily.    . prochlorperazine (COMPAZINE) 10 MG tablet Take 1 tablet (10 mg total) by mouth every 6 (six) hours as  needed for nausea or vomiting. 40 tablet 1  . tamsulosin (FLOMAX) 0.4 MG CAPS capsule Take 1 capsule by mouth daily.    Marland Kitchen dexamethasone (DECADRON) 4 MG tablet TAKE 2TABS BY MOUTH IN THE MORNING FOR 2 DAYS AFTER OXALIPLATIN CHEMOTHERAPY, THEN AS DIRECTED.     No current facility-administered medications for this visit.    Facility-Administered Medications Ordered in Other Visits  Medication Dose Route Frequency Provider Last Rate Last Dose  . 0.9 %  sodium chloride infusion   Intravenous Continuous Cammie Sickle, MD   Stopped at 08/21/19 1041  . fluorouracil (ADRUCIL) 5,000 mg in sodium chloride 0.9 % 150 mL chemo infusion  5,000 mg Intravenous 1 day or 1 dose Charlaine Dalton R, MD      . leucovorin 800 mg in dextrose 5 % 250 mL infusion  800 mg Intravenous Once Cammie Sickle, MD 145 mL/hr at 08/21/19 1047 800 mg at 08/21/19 1047  . oxaliplatin (ELOXATIN) 180 mg in dextrose 5 % 500 mL chemo infusion   85 mg/m2 (Treatment Plan Recorded) Intravenous Once Cammie Sickle, MD 268 mL/hr at 08/21/19 1048 180 mg at 08/21/19 1048  . sodium chloride flush (NS) 0.9 % injection 10 mL  10 mL Intravenous PRN Cammie Sickle, MD   10 mL at 08/21/19 0824    PHYSICAL EXAMINATION: ECOG PERFORMANCE STATUS: 0 - Asymptomatic  Today's Vitals   08/21/19 0841 08/21/19 0848 08/21/19 0900  BP: (!) 150/97  126/81  Pulse: 81    Resp: 16    Temp: (!) 97.4 F (36.3 C) (!) 97.4 F (36.3 C)   TempSrc: Temporal Tympanic   SpO2: 100%    Weight: 186 lb 12.8 oz (84.7 kg)    PainSc:  0-No pain    Body mass index is 25.33 kg/m.   Physical Exam  Constitutional: He is oriented to person, place, and time and well-developed, well-nourished, and in no distress.  Wearing mask  HENT:  Head: Normocephalic and atraumatic.  Mouth/Throat: Oropharynx is clear and moist. No oropharyngeal exudate.  Eyes: Pupils are equal, round, and reactive to light. Conjunctivae are normal. No scleral icterus.  Neck: Normal range of motion. Neck supple.  Cardiovascular: Normal rate and regular rhythm.  Pulmonary/Chest: No respiratory distress. He has no wheezes.  Abdominal: Soft. Bowel sounds are normal. He exhibits no distension and no mass. There is no abdominal tenderness. There is no rebound and no guarding.  Musculoskeletal: Normal range of motion.        General: No tenderness or edema.  Lymphadenopathy:    He has no cervical adenopathy.  Neurological: He is alert and oriented to person, place, and time.  Skin: Skin is warm and dry.  Psychiatric: Mood and affect normal.   LABORATORY DATA:  I have reviewed the data as listed Lab Results  Component Value Date   WBC 7.2 08/21/2019   HGB 12.6 (L) 08/21/2019   HCT 37.6 (L) 08/21/2019   MCV 91.5 08/21/2019   PLT 211 08/21/2019   Recent Labs    07/24/19 0813 08/07/19 0808 08/21/19 0822  NA 140 139 139  K 3.7 3.8 3.9  CL 106 110 108  CO2 '24 25 24  '$ GLUCOSE  180* 152* 111*  BUN '19 14 10  '$ CREATININE 0.80 0.73 0.69  CALCIUM 9.0 8.8* 8.7*  GFRNONAA >60 >60 >60  GFRAA >60 >60 >60  PROT 7.1 6.7 6.8  ALBUMIN 3.6 3.1* 3.7  AST '21 18 19  '$ ALT 15 15 16  ALKPHOS 79 93 100  BILITOT 0.5 0.6 0.4    RADIOGRAPHIC STUDIES: I have personally reviewed the radiological images as listed and agreed with the findings in the report. No results found.  ASSESSMENT & PLAN:  Cancer of right colon ([place]) #Right-sided colon adenocarcinoma-with synchronous metastasis to liver/unresectable.  Currently, status post cycle 2 of FOLFOX plus Avastin.  Avastin was held with cycle 1 due to port placement.  Tolerating treatment well without significant side effects.  #Labs today reviewed with patient and acceptable for treatment.  CEA on 08/07/2019 was 20.1. Patient verbalizes understanding and wishes to proceed with treatment today.  #Proceed with day 1, cycle 3 of FOLFOX plus Avastin chemotherapy  # DISPOSITION: #FOLFOX+ Avastin today # follow up in 2 weeks; MD; labs- cbc/cmp/UA/ CEA; FOLFOX + avastin- la  All questions were answered. The patient knows to call the clinic with any problems, questions or concerns.   Beckey Rutter, DNP, AGNP-[facility]: Dr. Rogue Bussing

## 2019-08-22 LAB — CEA: CEA: 23.4 ng/mL — ABNORMAL HIGH (ref 0.0–4.7)

## 2019-08-23 ENCOUNTER — Other Ambulatory Visit: Payer: Self-pay

## 2019-08-23 ENCOUNTER — Inpatient Hospital Stay: Payer: Medicare HMO

## 2019-08-23 DIAGNOSIS — Z7189 Other specified counseling: Secondary | ICD-10-CM

## 2019-08-23 DIAGNOSIS — Z5112 Encounter for antineoplastic immunotherapy: Secondary | ICD-10-CM | POA: Diagnosis not present

## 2019-08-23 DIAGNOSIS — Z5111 Encounter for antineoplastic chemotherapy: Secondary | ICD-10-CM | POA: Diagnosis not present

## 2019-08-23 DIAGNOSIS — C182 Malignant neoplasm of ascending colon: Secondary | ICD-10-CM

## 2019-08-23 DIAGNOSIS — Z452 Encounter for adjustment and management of vascular access device: Secondary | ICD-10-CM | POA: Diagnosis not present

## 2019-08-23 DIAGNOSIS — C787 Secondary malignant neoplasm of liver and intrahepatic bile duct: Secondary | ICD-10-CM | POA: Diagnosis not present

## 2019-08-23 MED ORDER — SODIUM CHLORIDE 0.9% FLUSH
10.0000 mL | INTRAVENOUS | Status: DC | PRN
  Administered 2019-08-23: 10 mL via INTRAVENOUS
  Filled 2019-08-23: qty 10

## 2019-08-23 MED ORDER — HEPARIN SOD (PORK) LOCK FLUSH 100 UNIT/ML IV SOLN
500.0000 [IU] | Freq: Once | INTRAVENOUS | Status: AC
  Administered 2019-08-23: 500 [IU] via INTRAVENOUS

## 2019-08-24 DIAGNOSIS — C182 Malignant neoplasm of ascending colon: Secondary | ICD-10-CM | POA: Diagnosis not present

## 2019-09-01 ENCOUNTER — Other Ambulatory Visit: Payer: Self-pay

## 2019-09-04 ENCOUNTER — Inpatient Hospital Stay: Payer: Medicare HMO

## 2019-09-04 ENCOUNTER — Inpatient Hospital Stay: Payer: Medicare HMO | Admitting: Internal Medicine

## 2019-09-04 ENCOUNTER — Other Ambulatory Visit: Payer: Self-pay

## 2019-09-04 ENCOUNTER — Encounter: Payer: Self-pay | Admitting: Internal Medicine

## 2019-09-04 VITALS — Resp 20

## 2019-09-04 DIAGNOSIS — Z7189 Other specified counseling: Secondary | ICD-10-CM

## 2019-09-04 DIAGNOSIS — Z452 Encounter for adjustment and management of vascular access device: Secondary | ICD-10-CM | POA: Diagnosis not present

## 2019-09-04 DIAGNOSIS — C182 Malignant neoplasm of ascending colon: Secondary | ICD-10-CM | POA: Diagnosis not present

## 2019-09-04 DIAGNOSIS — C787 Secondary malignant neoplasm of liver and intrahepatic bile duct: Secondary | ICD-10-CM | POA: Diagnosis not present

## 2019-09-04 DIAGNOSIS — Z5111 Encounter for antineoplastic chemotherapy: Secondary | ICD-10-CM | POA: Diagnosis not present

## 2019-09-04 DIAGNOSIS — Z5112 Encounter for antineoplastic immunotherapy: Secondary | ICD-10-CM | POA: Diagnosis not present

## 2019-09-04 LAB — CBC WITH DIFFERENTIAL/PLATELET
Abs Immature Granulocytes: 0 10*3/uL (ref 0.00–0.07)
Basophils Absolute: 0.1 10*3/uL (ref 0.0–0.1)
Basophils Relative: 1 %
Eosinophils Absolute: 0.5 10*3/uL (ref 0.0–0.5)
Eosinophils Relative: 10 %
HCT: 37 % — ABNORMAL LOW (ref 39.0–52.0)
Hemoglobin: 12.4 g/dL — ABNORMAL LOW (ref 13.0–17.0)
Immature Granulocytes: 0 %
Lymphocytes Relative: 29 %
Lymphs Abs: 1.6 10*3/uL (ref 0.7–4.0)
MCH: 31.1 pg (ref 26.0–34.0)
MCHC: 33.5 g/dL (ref 30.0–36.0)
MCV: 92.7 fL (ref 80.0–100.0)
Monocytes Absolute: 0.7 10*3/uL (ref 0.1–1.0)
Monocytes Relative: 13 %
Neutro Abs: 2.5 10*3/uL (ref 1.7–7.7)
Neutrophils Relative %: 47 %
Platelets: 193 10*3/uL (ref 150–400)
RBC: 3.99 MIL/uL — ABNORMAL LOW (ref 4.22–5.81)
RDW: 14.4 % (ref 11.5–15.5)
WBC: 5.3 10*3/uL (ref 4.0–10.5)
nRBC: 0 % (ref 0.0–0.2)

## 2019-09-04 LAB — URINALYSIS, COMPLETE (UACMP) WITH MICROSCOPIC
Bacteria, UA: NONE SEEN
Glucose, UA: NEGATIVE mg/dL
Hgb urine dipstick: NEGATIVE
Ketones, ur: 5 mg/dL — AB
Leukocytes,Ua: NEGATIVE
Nitrite: NEGATIVE
Protein, ur: 30 mg/dL — AB
Specific Gravity, Urine: 1.034 — ABNORMAL HIGH (ref 1.005–1.030)
pH: 5 (ref 5.0–8.0)

## 2019-09-04 LAB — COMPREHENSIVE METABOLIC PANEL
ALT: 13 U/L (ref 0–44)
AST: 21 U/L (ref 15–41)
Albumin: 3.5 g/dL (ref 3.5–5.0)
Alkaline Phosphatase: 101 U/L (ref 38–126)
Anion gap: 6 (ref 5–15)
BUN: 14 mg/dL (ref 8–23)
CO2: 24 mmol/L (ref 22–32)
Calcium: 8.7 mg/dL — ABNORMAL LOW (ref 8.9–10.3)
Chloride: 109 mmol/L (ref 98–111)
Creatinine, Ser: 0.77 mg/dL (ref 0.61–1.24)
GFR calc Af Amer: >60 mL/min (ref >60)
GFR calc non Af Amer: >60 mL/min (ref >60)
Glucose, Bld: 167 mg/dL — ABNORMAL HIGH (ref 70–99)
Potassium: 3.9 mmol/L (ref 3.5–5.1)
Sodium: 139 mmol/L (ref 135–145)
Total Bilirubin: 0.8 mg/dL (ref 0.3–1.2)
Total Protein: 6.7 g/dL (ref 6.5–8.1)

## 2019-09-04 MED ORDER — LEUCOVORIN CALCIUM INJECTION 350 MG
379.0000 mg/m2 | Freq: Once | INTRAVENOUS | Status: AC
  Administered 2019-09-04: 800 mg via INTRAVENOUS
  Filled 2019-09-04: qty 40

## 2019-09-04 MED ORDER — SODIUM CHLORIDE 0.9 % IV SOLN
5.0000 mg/kg | Freq: Once | INTRAVENOUS | Status: AC
  Administered 2019-09-04: 400 mg via INTRAVENOUS
  Filled 2019-09-04: qty 16

## 2019-09-04 MED ORDER — OXALIPLATIN CHEMO INJECTION 100 MG/20ML
85.0000 mg/m2 | Freq: Once | INTRAVENOUS | Status: AC
  Administered 2019-09-04: 180 mg via INTRAVENOUS
  Filled 2019-09-04: qty 36

## 2019-09-04 MED ORDER — SODIUM CHLORIDE 0.9 % IV SOLN
Freq: Once | INTRAVENOUS | Status: AC
  Administered 2019-09-04: 09:00:00 via INTRAVENOUS
  Filled 2019-09-04: qty 250

## 2019-09-04 MED ORDER — PALONOSETRON HCL INJECTION 0.25 MG/5ML
0.2500 mg | Freq: Once | INTRAVENOUS | Status: AC
  Administered 2019-09-04: 0.25 mg via INTRAVENOUS
  Filled 2019-09-04: qty 5

## 2019-09-04 MED ORDER — SODIUM CHLORIDE 0.9% FLUSH
10.0000 mL | INTRAVENOUS | Status: DC | PRN
  Administered 2019-09-04: 13:00:00 10 mL via INTRAVENOUS
  Filled 2019-09-04: qty 10

## 2019-09-04 MED ORDER — DEXAMETHASONE SODIUM PHOSPHATE 10 MG/ML IJ SOLN
10.0000 mg | Freq: Once | INTRAMUSCULAR | Status: AC
  Administered 2019-09-04: 10:00:00 10 mg via INTRAVENOUS
  Filled 2019-09-04: qty 1

## 2019-09-04 MED ORDER — HEPARIN SOD (PORK) LOCK FLUSH 100 UNIT/ML IV SOLN: 500.0000 [IU] | Freq: Once | INTRAVENOUS | Status: DC

## 2019-09-04 MED ORDER — SODIUM CHLORIDE 0.9 % IV SOLN
2400.0000 mg/m2 | INTRAVENOUS | Status: DC
  Administered 2019-09-04: 5050 mg via INTRAVENOUS
  Filled 2019-09-04: qty 100

## 2019-09-04 MED ORDER — SODIUM CHLORIDE 0.9% FLUSH
10.0000 mL | Freq: Once | INTRAVENOUS | Status: AC
  Administered 2019-09-04: 08:00:00 10 mL via INTRAVENOUS
  Filled 2019-09-04: qty 10

## 2019-09-04 MED ORDER — DEXTROSE 5 % IV SOLN
Freq: Once | INTRAVENOUS | Status: AC
  Administered 2019-09-04: 11:00:00 via INTRAVENOUS
  Filled 2019-09-04: qty 250

## 2019-09-04 NOTE — Progress Notes (Signed)
[facility] CONSULT NOTE  Patient Care Team: Sofie Hartigan, MD as PCP - General (Family Medicine)  CHIEF COMPLAINTS/PURPOSE OF CONSULTATION: Colon cancer  #  Oncology History Overview Note  # MAY 2020- 3. 03/09/19 Liver biopsy. Microscopic examination shows malignant cells with glandular architecture consistent with adenocarcinoma. The malignant cells are positive for CK20 and CDX-2. These findings support the clinical impression of metastatic colon adenocarcinoma. 4. 03/10/19 R hemicolectomy. Tumor site cecum. Adenocarcinoma. Mucinous features present. G2. No tumor deposits. Invades visceral peritoneum. No tumor perforation. LVI present. PNI not identified. All margins uninvolved. 1/12 LNs. PT4apN1. Periappendiceal inflammation c/w resolving abscess. Microsatellite stable (MSS). [Dr.Mettu; [facility]]  # SEP 4th 2020 [compared to May 2020]  Interval increase in size of the metastases to the hepatic dome, The metastasis to the left hepatic lobe is unchanged; 2.  New subcentimeter hypoattenuating lesion in the inferior right hepatic lobe, incompletely characterized on CT. 3.  Postsurgical changes following right hemicolectomy.  # NGS/F-ONE-MUTATED K-RAS [G]  DIAGNOSIS: COLON CANCER  STAGE:  IV       ;  GOALS:Palliative  CURRENT/MOST RECENT THERAPY : FOLFOX    Cancer of right colon ([place])  07/05/2019 Initial Diagnosis   Cancer of right colon ([place])   07/24/2019 -  Chemotherapy   The patient had palonosetron (ALOXI) injection 0.25 mg, 0.25 mg, Intravenous,  Once, 3 of 6 cycles Administration: 0.25 mg (07/24/2019), 0.25 mg (08/07/2019), 0.25 mg (08/21/2019) leucovorin 800 mg in dextrose 5 % 250 mL infusion, 844 mg, Intravenous,  Once, 3 of 6 cycles Administration: 800 mg (07/24/2019), 800 mg (08/07/2019), 800 mg (08/21/2019) oxaliplatin (ELOXATIN) 180 mg in dextrose 5 % 500 mL chemo infusion, 85 mg/m2 = 180 mg, Intravenous,  Once, 3 of 6 cycles Administration: 180 mg (07/24/2019), 180  mg (08/07/2019), 180 mg (08/21/2019) fluorouracil (ADRUCIL) 5,000 mg in sodium chloride 0.9 % 150 mL chemo infusion, 5,050 mg, Intravenous, 1 Day/Dose, 3 of 6 cycles Administration: 5,000 mg (07/24/2019), 5,000 mg (08/07/2019), 5,000 mg (08/21/2019) bevacizumab-bvzr (ZIRABEV) 400 mg in sodium chloride 0.9 % 100 mL chemo infusion, 5 mg/kg = 400 mg, Intravenous,  Once, 3 of 6 cycles Administration: 400 mg (08/07/2019), 400 mg (08/21/2019)  for chemotherapy treatment.      HISTORY OF PRESENTING ILLNESS:  Oscar [name] 78 y.o.  male with metastatic colon cancer to the liver currently on FOLFOX plus Avastin is here for follow-up.  Patient denies any nausea vomiting.  No abdominal pain..  No weight loss.  No tingling numbness.    Review of Systems  Constitutional: Positive for malaise/fatigue. Negative for chills, diaphoresis, fever and weight loss.  HENT: Negative for nosebleeds and sore throat.   Eyes: Negative for double vision.  Respiratory: Negative for cough, hemoptysis, sputum production, shortness of breath and wheezing.   Cardiovascular: Negative for chest pain, palpitations, orthopnea and leg swelling.  Gastrointestinal: Negative for abdominal pain, blood in stool, constipation, diarrhea, heartburn, melena, nausea and vomiting.  Genitourinary: Negative for dysuria, frequency and urgency.  Musculoskeletal: Positive for back pain and joint pain.  Skin: Negative.  Negative for itching and rash.  Neurological: Negative for dizziness, tingling, focal weakness, weakness and headaches.  Endo/Heme/Allergies: Does not bruise/bleed easily.  Psychiatric/Behavioral: Negative for depression. The patient is not nervous/anxious and does not have insomnia.      MEDICAL HISTORY:  Past Medical History:  Diagnosis Date  . Arthritis   . Diabetes mellitus without complication ([place])   . H/O colon cancer, stage IV   .  Hyperlipemia   . Hypertension     SURGICAL HISTORY: Past Surgical History:   Procedure Laterality Date  . IR IMAGING GUIDED PORT INSERTION  07/20/2019  . JOINT REPLACEMENT      SOCIAL HISTORY: Social History   Socioeconomic History  . Marital status: Married    Spouse name: Not on file  . Number of children: Not on file  . Years of education: Not on file  . Highest education level: Not on file  Occupational History  . Not on file  Social Needs  . Financial resource strain: Not on file  . Food insecurity    Worry: Not on file    Inability: Not on file  . Transportation needs    Medical: Not on file    Non-medical: Not on file  Tobacco Use  . Smoking status: Current Every Day Smoker    Packs/day: 0.25    Types: Cigarettes  . Smokeless tobacco: Never Used  . Tobacco comment: 1 to 2 cigarettes a day occasionally  Substance and Sexual Activity  . Alcohol use: No  . Drug use: No  . Sexual activity: Not on file  Lifestyle  . Physical activity    Days per week: Not on file    Minutes per session: Not on file  . Stress: Not on file  Relationships  . Social connections    Talks on phone: Not on file    Gets together: Not on file    Attends religious service: Not on file    Active member of club or organization: Not on file    Attends meetings of clubs or organizations: Not on file    Relationship status: Not on file  . Intimate partner violence    Fear of current or ex partner: Not on file    Emotionally abused: Not on file    Physically abused: Not on file    Forced sexual activity: Not on file  Other Topics Concern  . Not on file  Social History Narrative   Industrial engineer retd; lives in [place]; smoking 3cig/day; [3/4 ppd x started at 9 years]; no alcohol. Son & daughter; wife dementia [waiting for placement].     FAMILY HISTORY: Family History  Problem Relation Age of Onset  . Peptic Ulcer Disease Father     ALLERGIES:  is allergic to ace inhibitors.  MEDICATIONS:  Current Outpatient Medications  Medication Sig  Dispense Refill  . amLODipine (NORVASC) 5 MG tablet Take 5 mg by mouth daily.    . fluticasone (FLONASE) 50 MCG/ACT nasal spray Place into the nose.    . lidocaine-prilocaine (EMLA) cream Apply 1 application topically as needed. 30 g 0  . pravastatin (PRAVACHOL) 40 MG tablet Take 40 mg by mouth daily.    . tamsulosin (FLOMAX) 0.4 MG CAPS capsule Take 1 capsule by mouth daily.    . ondansetron (ZOFRAN-ODT) 4 MG disintegrating tablet Take 1 tablet (4 mg total) by mouth every 6 (six) hours as needed for nausea. (Patient not taking: Reported on 09/01/2019) 20 tablet 0  . prochlorperazine (COMPAZINE) 10 MG tablet Take 1 tablet (10 mg total) by mouth every 6 (six) hours as needed for nausea or vomiting. (Patient not taking: Reported on 09/01/2019) 40 tablet 1   No current facility-administered medications for this visit.    Facility-Administered Medications Ordered in Other Visits  Medication Dose Route Frequency Provider Last Rate Last Dose  . heparin lock flush 100 unit/mL  500 Units Intravenous Once Brahmanday, Govinda R,   MD          .  PHYSICAL EXAMINATION: ECOG PERFORMANCE STATUS: 0 - Asymptomatic  Vitals:   09/04/19 0822  BP: 134/74  Pulse: 84  Temp: 97.6 F (36.4 C)   Filed Weights   09/04/19 5366  Weight: 185 lb 8 oz (84.1 kg)    Physical Exam  Constitutional: He is oriented to person, place, and time and well-developed, well-nourished, and in no distress.  HENT:  Head: Normocephalic and atraumatic.  Mouth/Throat: Oropharynx is clear and moist. No oropharyngeal exudate.  Eyes: Pupils are equal, round, and reactive to light.  Neck: Normal range of motion. Neck supple.  Cardiovascular: Normal rate and regular rhythm.  Pulmonary/Chest: No respiratory distress. He has no wheezes.  Abdominal: Soft. Bowel sounds are normal. He exhibits no distension and no mass. There is no abdominal tenderness. There is no rebound and no guarding.  Musculoskeletal: Normal range of motion.         General: No tenderness or edema.  Neurological: He is alert and oriented to person, place, and time.  Skin: Skin is warm.  Psychiatric: Affect normal.   LABORATORY DATA:  I have reviewed the data as listed Lab Results  Component Value Date   WBC 5.3 09/04/2019   HGB 12.4 (L) 09/04/2019   HCT 37.0 (L) 09/04/2019   MCV 92.7 09/04/2019   PLT 193 09/04/2019   Recent Labs    08/07/19 0808 08/21/19 0822 09/04/19 0805  NA 139 139 139  K 3.8 3.9 3.9  CL 110 108 109  CO2 _0 GLUCOSE 152* 111* 167*  BUN _1 CREATININE 0.73 0.69 0.77  CALCIUM 8.8* 8.7* 8.7*  GFRNONAA >60 >60 >60  GFRAA >60 >60 >60  PROT 6.7 6.8 6.7  ALBUMIN 3.1* 3.7 3.5  AST _2 ALT _3 ALKPHOS 93 100 101  BILITOT 0.6 0.4 0.8    RADIOGRAPHIC STUDIES: I have personally reviewed the radiological images as listed and agreed with the findings in the report. No results found.  ASSESSMENT & PLAN:   Cancer of right colon ([place]) #Right-sided colon adenocarcinoma-with synchronous metastasis to liver/unresectable.  Currently on FOLFOX plus Avastin.   #Proceed with day 1, cycle 4 of FOLFOX plus Avastin chemotherapy. Discussed re: rising CEA; await from today. Will plan to have re-imaging after 6 cycles.   # DISPOSITION: #FOLFOX+ Avastin today # follow up in 2 weeks; MD; labs- cbc/cmp/UA/ CEA; FOLFOX + avastin- Dr.B  All questions were answered. The patient knows to call the clinic with any problems, questions or concerns.    Cammie Sickle, MD 09/04/2019 8:56 AM

## 2019-09-04 NOTE — Assessment & Plan Note (Addendum)
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  Currently on FOLFOX plus Avastin.   #Proceed with day 1, cycle 4 of FOLFOX plus Avastin chemotherapy. Discussed re: rising CEA; await from today. Will plan to have re-imaging after 6 cycles.   # DISPOSITION: #FOLFOX+ Avastin today # follow up in 2 weeks; MD; labs- cbc/cmp/UA/ CEA; FOLFOX + avastin- Dr.B

## 2019-09-05 LAB — CEA: CEA: 23.4 ng/mL — ABNORMAL HIGH (ref 0.0–4.7)

## 2019-09-06 ENCOUNTER — Inpatient Hospital Stay: Payer: Medicare HMO

## 2019-09-06 ENCOUNTER — Other Ambulatory Visit: Payer: Self-pay

## 2019-09-06 ENCOUNTER — Encounter: Payer: Self-pay | Admitting: Nurse Practitioner

## 2019-09-06 DIAGNOSIS — Z5111 Encounter for antineoplastic chemotherapy: Secondary | ICD-10-CM | POA: Diagnosis not present

## 2019-09-06 DIAGNOSIS — C182 Malignant neoplasm of ascending colon: Secondary | ICD-10-CM | POA: Diagnosis not present

## 2019-09-06 DIAGNOSIS — Z7189 Other specified counseling: Secondary | ICD-10-CM

## 2019-09-06 DIAGNOSIS — C787 Secondary malignant neoplasm of liver and intrahepatic bile duct: Secondary | ICD-10-CM | POA: Diagnosis not present

## 2019-09-06 DIAGNOSIS — Z5112 Encounter for antineoplastic immunotherapy: Secondary | ICD-10-CM | POA: Diagnosis not present

## 2019-09-06 DIAGNOSIS — Z452 Encounter for adjustment and management of vascular access device: Secondary | ICD-10-CM | POA: Diagnosis not present

## 2019-09-06 MED ORDER — SODIUM CHLORIDE 0.9% FLUSH
10.0000 mL | INTRAVENOUS | Status: DC | PRN
  Administered 2019-09-06: 10 mL
  Filled 2019-09-06: qty 10

## 2019-09-06 MED ORDER — HEPARIN SOD (PORK) LOCK FLUSH 100 UNIT/ML IV SOLN
500.0000 [IU] | Freq: Once | INTRAVENOUS | Status: AC | PRN
  Administered 2019-09-06: 500 [IU]
  Filled 2019-09-06: qty 5

## 2019-09-18 ENCOUNTER — Encounter: Payer: Medicare HMO | Admitting: Hospice and Palliative Medicine

## 2019-09-18 ENCOUNTER — Ambulatory Visit: Payer: Medicare HMO | Admitting: Internal Medicine

## 2019-09-18 ENCOUNTER — Other Ambulatory Visit: Payer: Medicare HMO

## 2019-09-20 ENCOUNTER — Inpatient Hospital Stay: Payer: Medicare HMO | Attending: Hospice and Palliative Medicine | Admitting: Hospice and Palliative Medicine

## 2019-09-20 ENCOUNTER — Inpatient Hospital Stay: Payer: Medicare HMO

## 2019-09-20 ENCOUNTER — Encounter: Payer: Self-pay | Admitting: Hospice and Palliative Medicine

## 2019-09-20 ENCOUNTER — Inpatient Hospital Stay (HOSPITAL_BASED_OUTPATIENT_CLINIC_OR_DEPARTMENT_OTHER): Payer: Medicare HMO | Admitting: Internal Medicine

## 2019-09-20 ENCOUNTER — Other Ambulatory Visit: Payer: Self-pay

## 2019-09-20 VITALS — BP 160/83 | HR 68

## 2019-09-20 VITALS — BP 143/80 | HR 74 | Temp 97.1°F | Resp 16 | Ht 72.0 in | Wt 185.4 lb

## 2019-09-20 DIAGNOSIS — C182 Malignant neoplasm of ascending colon: Secondary | ICD-10-CM | POA: Insufficient documentation

## 2019-09-20 DIAGNOSIS — Z7189 Other specified counseling: Secondary | ICD-10-CM | POA: Diagnosis not present

## 2019-09-20 DIAGNOSIS — Z515 Encounter for palliative care: Secondary | ICD-10-CM | POA: Diagnosis not present

## 2019-09-20 DIAGNOSIS — Z452 Encounter for adjustment and management of vascular access device: Secondary | ICD-10-CM | POA: Diagnosis not present

## 2019-09-20 DIAGNOSIS — Z5111 Encounter for antineoplastic chemotherapy: Secondary | ICD-10-CM | POA: Insufficient documentation

## 2019-09-20 DIAGNOSIS — Z5112 Encounter for antineoplastic immunotherapy: Secondary | ICD-10-CM | POA: Insufficient documentation

## 2019-09-20 DIAGNOSIS — C787 Secondary malignant neoplasm of liver and intrahepatic bile duct: Secondary | ICD-10-CM | POA: Diagnosis not present

## 2019-09-20 DIAGNOSIS — Z95828 Presence of other vascular implants and grafts: Secondary | ICD-10-CM

## 2019-09-20 LAB — COMPREHENSIVE METABOLIC PANEL
ALT: 14 U/L (ref 0–44)
AST: 20 U/L (ref 15–41)
Albumin: 3.7 g/dL (ref 3.5–5.0)
Alkaline Phosphatase: 91 U/L (ref 38–126)
Anion gap: 4 — ABNORMAL LOW (ref 5–15)
BUN: 12 mg/dL (ref 8–23)
CO2: 23 mmol/L (ref 22–32)
Calcium: 8.3 mg/dL — ABNORMAL LOW (ref 8.9–10.3)
Chloride: 108 mmol/L (ref 98–111)
Creatinine, Ser: 0.74 mg/dL (ref 0.61–1.24)
GFR calc Af Amer: >60 mL/min (ref >60)
GFR calc non Af Amer: >60 mL/min (ref >60)
Glucose, Bld: 142 mg/dL — ABNORMAL HIGH (ref 70–99)
Potassium: 3.7 mmol/L (ref 3.5–5.1)
Sodium: 135 mmol/L (ref 135–145)
Total Bilirubin: 0.7 mg/dL (ref 0.3–1.2)
Total Protein: 7 g/dL (ref 6.5–8.1)

## 2019-09-20 LAB — CBC WITH DIFFERENTIAL/PLATELET
Abs Immature Granulocytes: 0.01 10*3/uL (ref 0.00–0.07)
Basophils Absolute: 0.1 10*3/uL (ref 0.0–0.1)
Basophils Relative: 1 %
Eosinophils Absolute: 0.5 10*3/uL (ref 0.0–0.5)
Eosinophils Relative: 9 %
HCT: 39.8 % (ref 39.0–52.0)
Hemoglobin: 12.9 g/dL — ABNORMAL LOW (ref 13.0–17.0)
Immature Granulocytes: 0 %
Lymphocytes Relative: 29 %
Lymphs Abs: 1.5 10*3/uL (ref 0.7–4.0)
MCH: 31.2 pg (ref 26.0–34.0)
MCHC: 32.4 g/dL (ref 30.0–36.0)
MCV: 96.1 fL (ref 80.0–100.0)
Monocytes Absolute: 0.6 10*3/uL (ref 0.1–1.0)
Monocytes Relative: 12 %
Neutro Abs: 2.6 10*3/uL (ref 1.7–7.7)
Neutrophils Relative %: 49 %
Platelets: 175 10*3/uL (ref 150–400)
RBC: 4.14 MIL/uL — ABNORMAL LOW (ref 4.22–5.81)
RDW: 15.5 % (ref 11.5–15.5)
WBC: 5.3 10*3/uL (ref 4.0–10.5)
nRBC: 0 % (ref 0.0–0.2)

## 2019-09-20 LAB — URINALYSIS, COMPLETE (UACMP) WITH MICROSCOPIC
Bacteria, UA: NONE SEEN
Bilirubin Urine: NEGATIVE
Glucose, UA: NEGATIVE mg/dL
Hgb urine dipstick: NEGATIVE
Ketones, ur: 5 mg/dL — AB
Leukocytes,Ua: NEGATIVE
Nitrite: NEGATIVE
Protein, ur: NEGATIVE mg/dL
Specific Gravity, Urine: 1.028 (ref 1.005–1.030)
Squamous Epithelial / HPF: NONE SEEN (ref 0–5)
pH: 5 (ref 5.0–8.0)

## 2019-09-20 MED ORDER — OXALIPLATIN CHEMO INJECTION 100 MG/20ML
85.0000 mg/m2 | Freq: Once | INTRAVENOUS | Status: AC
  Administered 2019-09-20: 180 mg via INTRAVENOUS
  Filled 2019-09-20: qty 36

## 2019-09-20 MED ORDER — SODIUM CHLORIDE 0.9 % IV SOLN: 2400.0000 mg/m2 | INTRAVENOUS | Status: DC

## 2019-09-20 MED ORDER — SODIUM CHLORIDE 0.9 % IV SOLN
5.0000 mg/kg | Freq: Once | INTRAVENOUS | Status: AC
  Administered 2019-09-20: 400 mg via INTRAVENOUS
  Filled 2019-09-20: qty 16

## 2019-09-20 MED ORDER — SODIUM CHLORIDE 0.9% FLUSH
10.0000 mL | Freq: Once | INTRAVENOUS | Status: AC
  Administered 2019-09-20: 10 mL via INTRAVENOUS
  Filled 2019-09-20: qty 10

## 2019-09-20 MED ORDER — DEXTROSE 5 % IV SOLN
Freq: Once | INTRAVENOUS | Status: AC
  Administered 2019-09-20: 11:00:00 via INTRAVENOUS
  Filled 2019-09-20: qty 250

## 2019-09-20 MED ORDER — SODIUM CHLORIDE 0.9 % IV SOLN
5000.0000 mg | INTRAVENOUS | Status: DC
  Administered 2019-09-20: 5000 mg via INTRAVENOUS
  Filled 2019-09-20: qty 100

## 2019-09-20 MED ORDER — LEUCOVORIN CALCIUM INJECTION 350 MG
379.0000 mg/m2 | Freq: Once | INTRAVENOUS | Status: AC
  Administered 2019-09-20: 800 mg via INTRAVENOUS
  Filled 2019-09-20: qty 25

## 2019-09-20 MED ORDER — SODIUM CHLORIDE 0.9 % IV SOLN
Freq: Once | INTRAVENOUS | Status: AC
  Administered 2019-09-20: 10:00:00 via INTRAVENOUS
  Filled 2019-09-20: qty 250

## 2019-09-20 MED ORDER — PALONOSETRON HCL INJECTION 0.25 MG/5ML
0.2500 mg | Freq: Once | INTRAVENOUS | Status: AC
  Administered 2019-09-20: 0.25 mg via INTRAVENOUS
  Filled 2019-09-20: qty 5

## 2019-09-20 MED ORDER — DEXAMETHASONE SODIUM PHOSPHATE 10 MG/ML IJ SOLN
10.0000 mg | Freq: Once | INTRAMUSCULAR | Status: AC
  Administered 2019-09-20: 10 mg via INTRAVENOUS
  Filled 2019-09-20: qty 1

## 2019-09-20 NOTE — Progress Notes (Signed)
[place]  [phone] [phone]   Name: Oscar [name] Date: 09/20/2019 MRN: [id number]  DOB: [birth year]  Patient Care Team: Sofie Hartigan, MD as PCP - General (Family Medicine)    REASON FOR CONSULTATION: Palliative Care consult requested for this 78 y.o. male with multiple medical problems including stage IV colon cancer on treatment with FOLFOX.  Patient was referred to palliative care to help address goals and manage ongoing symptoms.  SOCIAL HISTORY:     reports that he has been smoking cigarettes. He has been smoking about 0.25 packs per day. He has never used smokeless tobacco. He reports that he does not drink alcohol or use drugs.   Patient is married.  He and his wife live with his stepdaughter.  His wife has advanced dementia and is followed at home by hospice.  Patient has a son and daughter and two twin stepdaughters.  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  ADVANCE DIRECTIVES:  Does not have  CODE STATUS:   PAST MEDICAL HISTORY: Past Medical History:  Diagnosis Date  . Arthritis   . Diabetes mellitus without complication ([place])   . H/O colon cancer, stage IV   . Hyperlipemia   . Hypertension     PAST SURGICAL HISTORY:  Past Surgical History:  Procedure Laterality Date  . IR IMAGING GUIDED PORT INSERTION  07/20/2019  . JOINT REPLACEMENT      HEMATOLOGY/ONCOLOGY HISTORY:  Oncology History Overview Note  # MAY 2020- 3. 03/09/19 Liver biopsy. Microscopic examination shows malignant cells with glandular architecture consistent with adenocarcinoma. The malignant cells are positive for CK20 and CDX-2. These findings support the clinical impression of metastatic colon adenocarcinoma. 4. 03/10/19 R hemicolectomy. Tumor site cecum. Adenocarcinoma. Mucinous features present. G2. No tumor deposits. Invades visceral peritoneum. No tumor perforation. LVI present. PNI not identified.  All margins uninvolved. 1/12 LNs. PT4apN1. Periappendiceal inflammation c/w resolving abscess. Microsatellite stable (MSS). [Dr.Mettu; [facility]]  # SEP 4th 2020 [compared to May 2020]  Interval increase in size of the metastases to the hepatic dome, The metastasis to the left hepatic lobe is unchanged; 2.  New subcentimeter hypoattenuating lesion in the inferior right hepatic lobe, incompletely characterized on CT. 3.  Postsurgical changes following right hemicolectomy.  # NGS/F-ONE-MUTATED K-RAS [G]  # PALLIATIVE CARE: 09/20/2019   DIAGNOSIS: COLON CANCER  STAGE:  IV       ;  GOALS:Palliative  CURRENT/MOST RECENT THERAPY : FOLFOX    Cancer of right colon ([place])  07/05/2019 Initial Diagnosis   Cancer of right colon ([place])   07/24/2019 -  Chemotherapy   The patient had palonosetron (ALOXI) injection 0.25 mg, 0.25 mg, Intravenous,  Once, 5 of 6 cycles Administration: 0.25 mg (07/24/2019), 0.25 mg (08/07/2019), 0.25 mg (08/21/2019), 0.25 mg (09/04/2019) leucovorin 800 mg in dextrose 5 % 250 mL infusion, 844 mg, Intravenous,  Once, 5 of 6 cycles Administration: 800 mg (07/24/2019), 800 mg (08/07/2019), 800 mg (08/21/2019), 800 mg (09/04/2019) oxaliplatin (ELOXATIN) 180 mg in dextrose 5 % 500 mL chemo infusion, 85 mg/m2 = 180 mg, Intravenous,  Once, 5 of 6 cycles Administration: 180 mg (07/24/2019), 180 mg (08/07/2019), 180 mg (08/21/2019), 180 mg (09/04/2019) fluorouracil (ADRUCIL) 5,000 mg in sodium chloride 0.9 % 150 mL chemo infusion, 5,050 mg, Intravenous, 1 Day/Dose, 5 of 6 cycles Administration: 5,000 mg (07/24/2019), 5,000 mg (08/07/2019), 5,000 mg (08/21/2019), 5,050 mg (09/04/2019) bevacizumab-bvzr (ZIRABEV) 400 mg in sodium chloride 0.9 % 100 mL chemo  infusion, 5 mg/kg = 400 mg, Intravenous,  Once, 5 of 6 cycles Administration: 400 mg (08/07/2019), 400 mg (08/21/2019), 400 mg (09/04/2019)  for chemotherapy treatment.      ALLERGIES:  is allergic to ace inhibitors.  MEDICATIONS:  Current  Outpatient Medications  Medication Sig Dispense Refill  . amLODipine (NORVASC) 5 MG tablet Take 5 mg by mouth daily.    . fluticasone (FLONASE) 50 MCG/ACT nasal spray Place 1 spray into both nostrils daily as needed.     . tamsulosin (FLOMAX) 0.4 MG CAPS capsule Take 1 capsule by mouth daily.    Marland Kitchen lidocaine-prilocaine (EMLA) cream Apply 1 application topically as needed. (Patient not taking: Reported on 09/20/2019) 30 g 0  . ondansetron (ZOFRAN-ODT) 4 MG disintegrating tablet Take 1 tablet (4 mg total) by mouth every 6 (six) hours as needed for nausea. (Patient not taking: Reported on 09/01/2019) 20 tablet 0  . pravastatin (PRAVACHOL) 40 MG tablet Take 40 mg by mouth daily.    . prochlorperazine (COMPAZINE) 10 MG tablet Take 1 tablet (10 mg total) by mouth every 6 (six) hours as needed for nausea or vomiting. (Patient not taking: Reported on 09/01/2019) 40 tablet 1   No current facility-administered medications for this visit.    Facility-Administered Medications Ordered in Other Visits  Medication Dose Route Frequency Provider Last Rate Last Dose  . fluorouracil (ADRUCIL) 5,000 mg in sodium chloride 0.9 % 150 mL chemo infusion  5,000 mg Intravenous 1 day or 1 dose Cammie Sickle, MD        VITAL SIGNS: BP (!) 143/80   Pulse 74   Temp (!) 97.1 F (36.2 C) (Tympanic)   Resp 16   Ht 6' (1.829 m)   Wt 185 lb 6.4 oz (84.1 kg)   BMI 25.14 kg/m  Filed Weights   09/20/19 0838  Weight: 185 lb 6.4 oz (84.1 kg)    Estimated body mass index is 25.14 kg/m as calculated from the following:   Height as of this encounter: 6' (1.829 m).   Weight as of this encounter: 185 lb 6.4 oz (84.1 kg).  LABS: CBC:    Component Value Date/Time   WBC 5.3 09/20/2019 0922   HGB 12.9 (L) 09/20/2019 0922   HGB 13.3 09/18/2013 0424   HCT 39.8 09/20/2019 0922   HCT 38.1 (L) 09/18/2013 0424   PLT 175 09/20/2019 0922   PLT 307 09/18/2013 0424   MCV 96.1 09/20/2019 0922   MCV 94 09/18/2013 0424    NEUTROABS 2.6 09/20/2019 0922   NEUTROABS 17.0 (H) 09/18/2013 0424   LYMPHSABS 1.5 09/20/2019 0922   LYMPHSABS 0.9 (L) 09/18/2013 0424   MONOABS 0.6 09/20/2019 0922   MONOABS 0.4 09/18/2013 0424   EOSABS 0.5 09/20/2019 0922   EOSABS 0.0 09/18/2013 0424   BASOSABS 0.1 09/20/2019 0922   BASOSABS 0.0 09/18/2013 0424   Comprehensive Metabolic Panel:    Component Value Date/Time   NA 135 09/20/2019 0922   NA 137 09/18/2013 0424   K 3.7 09/20/2019 0922   K 4.5 09/18/2013 0424   CL 108 09/20/2019 0922   CL 107 09/18/2013 0424   CO2 23 09/20/2019 0922   CO2 28 09/18/2013 0424   BUN 12 09/20/2019 0922   BUN 19 (H) 09/18/2013 0424   CREATININE 0.74 09/20/2019 0922   CREATININE 1.15 09/18/2013 0424   GLUCOSE 142 (H) 09/20/2019 0922   GLUCOSE 196 (H) 09/18/2013 0424   CALCIUM 8.3 (L) 09/20/2019 0922   CALCIUM 9.1 09/18/2013  0424   AST 20 09/20/2019 0922   ALT 14 09/20/2019 0922   ALKPHOS 91 09/20/2019 0922   BILITOT 0.7 09/20/2019 0922   PROT 7.0 09/20/2019 0922   ALBUMIN 3.7 09/20/2019 0922    RADIOGRAPHIC STUDIES: No results found.  PERFORMANCE STATUS (ECOG) : 1 - Symptomatic but completely ambulatory  Review of Systems Unless otherwise noted, a complete review of systems is negative.  Physical Exam General: NAD, frail appearing, thin Pulmonary: Unlabored Extremities: no edema, no joint deformities Skin: no rashes Neurological: Weakness but otherwise nonfocal  IMPRESSION: I met with patient today to introduce palliative care services.  Patient reports that he is doing reasonably well.  He denies any acute changes or concerns.  He seems to be tolerating treatment so far without significant distressing symptoms.  He is still playing golf and poker weekly.  Patient seems to have a reasonable understanding of his cancer and treatment plan.  His goals are aligned with continued treatment for now.  Patient's wife is currently followed at home by hospice due to her  diagnosis of advanced dementia.  Both patient and wife live with his stepdaughter, who is the primary caregiver.  Patient does not currently have a healthcare power of attorney or living will.  I did review ACP documents and a MOST Form with him.  Patient took documents home with him to consider decision-making.  Case and plan discussed with Dr. Rogue Bussing  PLAN: -Continue current scope of treatment -ACP/MOST Form reviewed -Follow-up telephone visit in 2 to 3 weeks   Patient expressed understanding and was in agreement with this plan. He also understands that He can call the clinic at any time with any questions, concerns, or complaints.     Time Total: 30 minutes  Visit consisted of counseling and education dealing with the complex and emotionally intense issues of symptom management and palliative care in the setting of serious and potentially life-threatening illness.Greater than 50%  of this time was spent counseling and coordinating care related to the above assessment and plan.  Signed by: Altha Harm, PhD, NP-C

## 2019-09-20 NOTE — Assessment & Plan Note (Addendum)
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  Currently on FOLFOX plus Avastin.   #Proceed with day 1, cycle 4 of FOLFOX plus Avastin chemotherapy. Discussed re: rising CEA; await from today. Will plan to have re-imaging after 6 cycles if cea is improving; If getting worse will get a scan prior to next cycle.   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  Monitor for now.  # HTN- systolic 0000000; discussed re: avastin.   #Palliative care discussion today.   # DISPOSITION: #FOLFOX+ Avastin today # follow up in 2 weeks; MD; labs- cbc/cmp/UA/ CEA; FOLFOX + avastin- Dr.B

## 2019-09-20 NOTE — Progress Notes (Signed)
Pt here to get chemo today and was made an appt to talk about advance directives and how he is feeling about his treatments and dx. Pt lives with step daughter because she helps him with his wife who has dementia

## 2019-09-20 NOTE — Progress Notes (Signed)
[facility] CONSULT NOTE  Patient Care Team: Sofie Hartigan, MD as PCP - General (Family Medicine)  CHIEF COMPLAINTS/PURPOSE OF CONSULTATION: Colon cancer  #  Oncology History Overview Note  # MAY 2020- 3. 03/09/19 Liver biopsy. Microscopic examination shows malignant cells with glandular architecture consistent with adenocarcinoma. The malignant cells are positive for CK20 and CDX-2. These findings support the clinical impression of metastatic colon adenocarcinoma. 4. 03/10/19 R hemicolectomy. Tumor site cecum. Adenocarcinoma. Mucinous features present. G2. No tumor deposits. Invades visceral peritoneum. No tumor perforation. LVI present. PNI not identified. All margins uninvolved. 1/12 LNs. PT4apN1. Periappendiceal inflammation c/w resolving abscess. Microsatellite stable (MSS). [Dr.Mettu; [facility]]  # SEP 4th 2020 [compared to May 2020]  Interval increase in size of the metastases to the hepatic dome, The metastasis to the left hepatic lobe is unchanged; 2.  New subcentimeter hypoattenuating lesion in the inferior right hepatic lobe, incompletely characterized on CT. 3.  Postsurgical changes following right hemicolectomy.  # NGS/F-ONE-MUTATED K-RAS [G]  # PALLIATIVE CARE: 09/20/2019   DIAGNOSIS: COLON CANCER  STAGE:  IV       ;  GOALS:Palliative  CURRENT/MOST RECENT THERAPY : FOLFOX    Cancer of right colon ([place])  07/05/2019 Initial Diagnosis   Cancer of right colon ([place])   07/24/2019 -  Chemotherapy   The patient had palonosetron (ALOXI) injection 0.25 mg, 0.25 mg, Intravenous,  Once, 5 of 6 cycles Administration: 0.25 mg (07/24/2019), 0.25 mg (08/07/2019), 0.25 mg (08/21/2019), 0.25 mg (09/04/2019) leucovorin 800 mg in dextrose 5 % 250 mL infusion, 844 mg, Intravenous,  Once, 5 of 6 cycles Administration: 800 mg (07/24/2019), 800 mg (08/07/2019), 800 mg (08/21/2019), 800 mg (09/04/2019) oxaliplatin (ELOXATIN) 180 mg in dextrose 5 % 500 mL chemo infusion, 85 mg/m2 = 180  mg, Intravenous,  Once, 5 of 6 cycles Administration: 180 mg (07/24/2019), 180 mg (08/07/2019), 180 mg (08/21/2019), 180 mg (09/04/2019) fluorouracil (ADRUCIL) 5,000 mg in sodium chloride 0.9 % 150 mL chemo infusion, 5,050 mg, Intravenous, 1 Day/Dose, 5 of 6 cycles Administration: 5,000 mg (07/24/2019), 5,000 mg (08/07/2019), 5,000 mg (08/21/2019), 5,050 mg (09/04/2019) bevacizumab-bvzr (ZIRABEV) 400 mg in sodium chloride 0.9 % 100 mL chemo infusion, 5 mg/kg = 400 mg, Intravenous,  Once, 5 of 6 cycles Administration: 400 mg (08/07/2019), 400 mg (08/21/2019), 400 mg (09/04/2019)  for chemotherapy treatment.      HISTORY OF PRESENTING ILLNESS:  Oscar [name] 78 y.o.  male with metastatic colon cancer to the liver currently on FOLFOX plus Avastin is here for follow-up.  Patient states his appetite is good.  No nausea vomiting.  Mild tingling numbness.   Review of Systems  Constitutional: Positive for malaise/fatigue. Negative for chills, diaphoresis, fever and weight loss.  HENT: Negative for nosebleeds and sore throat.   Eyes: Negative for double vision.  Respiratory: Negative for cough, hemoptysis, sputum production, shortness of breath and wheezing.   Cardiovascular: Negative for chest pain, palpitations, orthopnea and leg swelling.  Gastrointestinal: Negative for abdominal pain, blood in stool, constipation, diarrhea, heartburn, melena, nausea and vomiting.  Genitourinary: Negative for dysuria, frequency and urgency.  Musculoskeletal: Positive for back pain and joint pain.  Skin: Negative.  Negative for itching and rash.  Neurological: Negative for dizziness, tingling, focal weakness, weakness and headaches.  Endo/Heme/Allergies: Does not bruise/bleed easily.  Psychiatric/Behavioral: Negative for depression. The patient is not nervous/anxious and does not have insomnia.      MEDICAL HISTORY:  Past Medical History:  Diagnosis Date  . Arthritis   .  Diabetes mellitus without  complication ([place])   . H/O colon cancer, stage IV   . Hyperlipemia   . Hypertension     SURGICAL HISTORY: Past Surgical History:  Procedure Laterality Date  . IR IMAGING GUIDED PORT INSERTION  07/20/2019  . JOINT REPLACEMENT      SOCIAL HISTORY: Social History   Socioeconomic History  . Marital status: Married    Spouse name: Not on file  . Number of children: Not on file  . Years of education: Not on file  . Highest education level: Not on file  Occupational History  . Not on file  Social Needs  . Financial resource strain: Not on file  . Food insecurity    Worry: Not on file    Inability: Not on file  . Transportation needs    Medical: Not on file    Non-medical: Not on file  Tobacco Use  . Smoking status: Current Every Day Smoker    Packs/day: 0.25    Types: Cigarettes  . Smokeless tobacco: Never Used  . Tobacco comment: 1 to 2 cigarettes a day occasionally  Substance and Sexual Activity  . Alcohol use: No  . Drug use: No  . Sexual activity: Not on file  Lifestyle  . Physical activity    Days per week: Not on file    Minutes per session: Not on file  . Stress: Not on file  Relationships  . Social Herbalist on phone: Not on file    Gets together: Not on file    Attends religious service: Not on file    Active member of club or organization: Not on file    Attends meetings of clubs or organizations: Not on file    Relationship status: Not on file  . Intimate partner violence    Fear of current or ex partner: Not on file    Emotionally abused: Not on file    Physically abused: Not on file    Forced sexual activity: Not on file  Other Topics Concern  . Not on file  Social History Narrative   Recruitment consultant retd; lives in [place]; smoking 3cig/day; [3/4 ppd x started at 9 years]; no alcohol. Son & daughter; wife dementia [waiting for placement].     FAMILY HISTORY: Family History  Problem Relation Age of Onset  . Peptic  Ulcer Disease Father     ALLERGIES:  is allergic to ace inhibitors.  MEDICATIONS:  Current Outpatient Medications  Medication Sig Dispense Refill  . amLODipine (NORVASC) 5 MG tablet Take 5 mg by mouth daily.    . fluticasone (FLONASE) 50 MCG/ACT nasal spray Place 1 spray into both nostrils daily as needed.     . lidocaine-prilocaine (EMLA) cream Apply 1 application topically as needed. (Patient not taking: Reported on 09/20/2019) 30 g 0  . ondansetron (ZOFRAN-ODT) 4 MG disintegrating tablet Take 1 tablet (4 mg total) by mouth every 6 (six) hours as needed for nausea. (Patient not taking: Reported on 09/01/2019) 20 tablet 0  . pravastatin (PRAVACHOL) 40 MG tablet Take 40 mg by mouth daily.    . prochlorperazine (COMPAZINE) 10 MG tablet Take 1 tablet (10 mg total) by mouth every 6 (six) hours as needed for nausea or vomiting. (Patient not taking: Reported on 09/01/2019) 40 tablet 1  . tamsulosin (FLOMAX) 0.4 MG CAPS capsule Take 1 capsule by mouth daily.     No current facility-administered medications for this visit.    Facility-Administered Medications Ordered  in Other Visits  Medication Dose Route Frequency Provider Last Rate Last Dose  . fluorouracil (ADRUCIL) 5,000 mg in sodium chloride 0.9 % 150 mL chemo infusion  5,000 mg Intravenous 1 day or 1 dose Charlaine Dalton R, MD      . leucovorin 800 mg in dextrose 5 % 250 mL infusion  379 mg/m2 (Treatment Plan Recorded) Intravenous Once Cammie Sickle, MD 145 mL/hr at 09/20/19 1128 800 mg at 09/20/19 1128  . oxaliplatin (ELOXATIN) 180 mg in dextrose 5 % 500 mL chemo infusion  85 mg/m2 (Treatment Plan Recorded) Intravenous Once Cammie Sickle, MD 268 mL/hr at 09/20/19 1130 180 mg at 09/20/19 1130      .  PHYSICAL EXAMINATION: ECOG PERFORMANCE STATUS: 0 - Asymptomatic  There were no vitals filed for this visit. There were no vitals filed for this visit.  Physical Exam  Constitutional: He is oriented to person, place,  and time and well-developed, well-nourished, and in no distress.  HENT:  Head: Normocephalic and atraumatic.  Mouth/Throat: Oropharynx is clear and moist. No oropharyngeal exudate.  Eyes: Pupils are equal, round, and reactive to light.  Neck: Normal range of motion. Neck supple.  Cardiovascular: Normal rate and regular rhythm.  Pulmonary/Chest: No respiratory distress. He has no wheezes.  Abdominal: Soft. Bowel sounds are normal. He exhibits no distension and no mass. There is no abdominal tenderness. There is no rebound and no guarding.  Musculoskeletal: Normal range of motion.        General: No tenderness or edema.  Neurological: He is alert and oriented to person, place, and time.  Skin: Skin is warm.  Psychiatric: Affect normal.   LABORATORY DATA:  I have reviewed the data as listed Lab Results  Component Value Date   WBC 5.3 09/20/2019   HGB 12.9 (L) 09/20/2019   HCT 39.8 09/20/2019   MCV 96.1 09/20/2019   PLT 175 09/20/2019   Recent Labs    08/21/19 0822 09/04/19 0805 09/20/19 0922  NA 139 139 135  K 3.9 3.9 3.7  CL 108 109 108  CO2 '24 24 23  '$ GLUCOSE 111* 167* 142*  BUN '10 14 12  '$ CREATININE 0.69 0.77 0.74  CALCIUM 8.7* 8.7* 8.3*  GFRNONAA >60 >60 >60  GFRAA >60 >60 >60  PROT 6.8 6.7 7.0  ALBUMIN 3.7 3.5 3.7  AST '19 21 20  '$ ALT '16 13 14  '$ ALKPHOS 100 101 91  BILITOT 0.4 0.8 0.7    RADIOGRAPHIC STUDIES: I have personally reviewed the radiological images as listed and agreed with the findings in the report. No results found.  ASSESSMENT & PLAN:   Cancer of right colon ([place]) #Right-sided colon adenocarcinoma-with synchronous metastasis to liver/unresectable.  Currently on FOLFOX plus Avastin.   #Proceed with day 1, cycle 4 of FOLFOX plus Avastin chemotherapy. Discussed re: rising CEA; await from today. Will plan to have re-imaging after 6 cycles if cea is improving; If getting worse will get a scan prior to next cycle.   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   Monitor for now.  # HTN- systolic 308M; discussed re: avastin.   #Palliative care discussion today.   # DISPOSITION: #FOLFOX+ Avastin today # follow up in 2 weeks; MD; labs- cbc/cmp/UA/ CEA; FOLFOX + avastin- Dr.B  All questions were answered. The patient knows to call the clinic with any problems, questions or concerns.    Cammie Sickle, MD 09/20/2019 12:33 PM

## 2019-09-20 NOTE — Progress Notes (Signed)
1020: Per Dr. Rogue Bussing okay to proceed with treatment including Zirabev with urine protein of 30 from 09/04/2019.  Urine collected and sent to lab.

## 2019-09-21 LAB — CEA: CEA: 23.5 ng/mL — ABNORMAL HIGH (ref 0.0–4.7)

## 2019-09-22 ENCOUNTER — Inpatient Hospital Stay: Payer: Medicare HMO

## 2019-09-22 ENCOUNTER — Other Ambulatory Visit: Payer: Self-pay

## 2019-09-22 DIAGNOSIS — C182 Malignant neoplasm of ascending colon: Secondary | ICD-10-CM | POA: Diagnosis not present

## 2019-09-22 DIAGNOSIS — Z7189 Other specified counseling: Secondary | ICD-10-CM

## 2019-09-22 DIAGNOSIS — Z5111 Encounter for antineoplastic chemotherapy: Secondary | ICD-10-CM | POA: Diagnosis not present

## 2019-09-22 DIAGNOSIS — C787 Secondary malignant neoplasm of liver and intrahepatic bile duct: Secondary | ICD-10-CM | POA: Diagnosis not present

## 2019-09-22 DIAGNOSIS — Z5112 Encounter for antineoplastic immunotherapy: Secondary | ICD-10-CM | POA: Diagnosis not present

## 2019-09-22 DIAGNOSIS — Z452 Encounter for adjustment and management of vascular access device: Secondary | ICD-10-CM | POA: Diagnosis not present

## 2019-09-22 MED ORDER — SODIUM CHLORIDE 0.9% FLUSH
10.0000 mL | INTRAVENOUS | Status: DC | PRN
  Administered 2019-09-22: 10 mL
  Filled 2019-09-22: qty 10

## 2019-09-22 MED ORDER — HEPARIN SOD (PORK) LOCK FLUSH 100 UNIT/ML IV SOLN
INTRAVENOUS | Status: AC
  Filled 2019-09-22: qty 5

## 2019-09-22 MED ORDER — HEPARIN SOD (PORK) LOCK FLUSH 100 UNIT/ML IV SOLN
500.0000 [IU] | Freq: Once | INTRAVENOUS | Status: AC | PRN
  Administered 2019-09-22: 500 [IU]

## 2019-09-23 DIAGNOSIS — C182 Malignant neoplasm of ascending colon: Secondary | ICD-10-CM | POA: Diagnosis not present

## 2019-10-03 ENCOUNTER — Other Ambulatory Visit: Payer: Self-pay

## 2019-10-03 ENCOUNTER — Encounter: Payer: Self-pay | Admitting: Internal Medicine

## 2019-10-04 ENCOUNTER — Inpatient Hospital Stay: Payer: Medicare HMO

## 2019-10-04 ENCOUNTER — Inpatient Hospital Stay (HOSPITAL_BASED_OUTPATIENT_CLINIC_OR_DEPARTMENT_OTHER): Payer: Medicare HMO | Admitting: Internal Medicine

## 2019-10-04 ENCOUNTER — Other Ambulatory Visit: Payer: Self-pay

## 2019-10-04 DIAGNOSIS — C787 Secondary malignant neoplasm of liver and intrahepatic bile duct: Secondary | ICD-10-CM | POA: Diagnosis not present

## 2019-10-04 DIAGNOSIS — C182 Malignant neoplasm of ascending colon: Secondary | ICD-10-CM

## 2019-10-04 DIAGNOSIS — Z452 Encounter for adjustment and management of vascular access device: Secondary | ICD-10-CM | POA: Diagnosis not present

## 2019-10-04 DIAGNOSIS — Z5112 Encounter for antineoplastic immunotherapy: Secondary | ICD-10-CM | POA: Diagnosis not present

## 2019-10-04 DIAGNOSIS — Z7189 Other specified counseling: Secondary | ICD-10-CM

## 2019-10-04 DIAGNOSIS — Z5111 Encounter for antineoplastic chemotherapy: Secondary | ICD-10-CM | POA: Diagnosis not present

## 2019-10-04 LAB — CBC WITH DIFFERENTIAL/PLATELET
Abs Immature Granulocytes: 0.01 10*3/uL (ref 0.00–0.07)
Basophils Absolute: 0 10*3/uL (ref 0.0–0.1)
Basophils Relative: 1 %
Eosinophils Absolute: 0.4 10*3/uL (ref 0.0–0.5)
Eosinophils Relative: 9 %
HCT: 37 % — ABNORMAL LOW (ref 39.0–52.0)
Hemoglobin: 12.3 g/dL — ABNORMAL LOW (ref 13.0–17.0)
Immature Granulocytes: 0 %
Lymphocytes Relative: 29 %
Lymphs Abs: 1.4 10*3/uL (ref 0.7–4.0)
MCH: 32.1 pg (ref 26.0–34.0)
MCHC: 33.2 g/dL (ref 30.0–36.0)
MCV: 96.6 fL (ref 80.0–100.0)
Monocytes Absolute: 0.5 10*3/uL (ref 0.1–1.0)
Monocytes Relative: 11 %
Neutro Abs: 2.3 10*3/uL (ref 1.7–7.7)
Neutrophils Relative %: 50 %
Platelets: 145 10*3/uL — ABNORMAL LOW (ref 150–400)
RBC: 3.83 MIL/uL — ABNORMAL LOW (ref 4.22–5.81)
RDW: 15.4 % (ref 11.5–15.5)
WBC: 4.6 10*3/uL (ref 4.0–10.5)
nRBC: 0 % (ref 0.0–0.2)

## 2019-10-04 LAB — COMPREHENSIVE METABOLIC PANEL
ALT: 15 U/L (ref 0–44)
AST: 22 U/L (ref 15–41)
Albumin: 3.5 g/dL (ref 3.5–5.0)
Alkaline Phosphatase: 90 U/L (ref 38–126)
Anion gap: 7 (ref 5–15)
BUN: 12 mg/dL (ref 8–23)
CO2: 24 mmol/L (ref 22–32)
Calcium: 8.8 mg/dL — ABNORMAL LOW (ref 8.9–10.3)
Chloride: 109 mmol/L (ref 98–111)
Creatinine, Ser: 0.75 mg/dL (ref 0.61–1.24)
GFR calc Af Amer: >60 mL/min (ref >60)
GFR calc non Af Amer: >60 mL/min (ref >60)
Glucose, Bld: 156 mg/dL — ABNORMAL HIGH (ref 70–99)
Potassium: 3.7 mmol/L (ref 3.5–5.1)
Sodium: 140 mmol/L (ref 135–145)
Total Bilirubin: 0.7 mg/dL (ref 0.3–1.2)
Total Protein: 6.5 g/dL (ref 6.5–8.1)

## 2019-10-04 LAB — URINALYSIS, COMPLETE (UACMP) WITH MICROSCOPIC
Bacteria, UA: NONE SEEN
Bilirubin Urine: NEGATIVE
Glucose, UA: 50 mg/dL — AB
Hgb urine dipstick: NEGATIVE
Ketones, ur: NEGATIVE mg/dL
Leukocytes,Ua: NEGATIVE
Nitrite: NEGATIVE
Protein, ur: NEGATIVE mg/dL
Specific Gravity, Urine: 1.012 (ref 1.005–1.030)
pH: 7 (ref 5.0–8.0)

## 2019-10-04 MED ORDER — PALONOSETRON HCL INJECTION 0.25 MG/5ML
0.2500 mg | Freq: Once | INTRAVENOUS | Status: AC
  Administered 2019-10-04: 0.25 mg via INTRAVENOUS
  Filled 2019-10-04: qty 5

## 2019-10-04 MED ORDER — DEXAMETHASONE SODIUM PHOSPHATE 10 MG/ML IJ SOLN
10.0000 mg | Freq: Once | INTRAMUSCULAR | Status: AC
  Administered 2019-10-04: 10 mg via INTRAVENOUS
  Filled 2019-10-04: qty 1

## 2019-10-04 MED ORDER — SODIUM CHLORIDE 0.9 % IV SOLN
2369.0000 mg/m2 | INTRAVENOUS | Status: DC
  Administered 2019-10-04: 13:00:00 5000 mg via INTRAVENOUS
  Filled 2019-10-04: qty 100

## 2019-10-04 MED ORDER — OXALIPLATIN CHEMO INJECTION 100 MG/20ML
85.0000 mg/m2 | Freq: Once | INTRAVENOUS | Status: AC
  Administered 2019-10-04: 180 mg via INTRAVENOUS
  Filled 2019-10-04: qty 36

## 2019-10-04 MED ORDER — SODIUM CHLORIDE 0.9 % IV SOLN
5.0000 mg/kg | Freq: Once | INTRAVENOUS | Status: AC
  Administered 2019-10-04: 400 mg via INTRAVENOUS
  Filled 2019-10-04: qty 16

## 2019-10-04 MED ORDER — LEUCOVORIN CALCIUM INJECTION 350 MG
379.0000 mg/m2 | Freq: Once | INTRAVENOUS | Status: AC
  Administered 2019-10-04: 800 mg via INTRAVENOUS
  Filled 2019-10-04: qty 40

## 2019-10-04 MED ORDER — DEXTROSE 5 % IV SOLN
Freq: Once | INTRAVENOUS | Status: AC
  Filled 2019-10-04: qty 250

## 2019-10-04 MED ORDER — SODIUM CHLORIDE 0.9 % IV SOLN
INTRAVENOUS | Status: DC
  Filled 2019-10-04: qty 250

## 2019-10-04 NOTE — Progress Notes (Signed)
[facility] CONSULT NOTE  Patient Care Team: Sofie Hartigan, MD as PCP - General (Family Medicine)  CHIEF COMPLAINTS/PURPOSE OF CONSULTATION: Colon cancer  #  Oncology History Overview Note  # MAY 2020- 3. 03/09/19 Liver biopsy. Microscopic examination shows malignant cells with glandular architecture consistent with adenocarcinoma. The malignant cells are positive for CK20 and CDX-2. These findings support the clinical impression of metastatic colon adenocarcinoma. 4. 03/10/19 R hemicolectomy. Tumor site cecum. Adenocarcinoma. Mucinous features present. G2. No tumor deposits. Invades visceral peritoneum. No tumor perforation. LVI present. PNI not identified. All margins uninvolved. 1/12 LNs. PT4apN1. Periappendiceal inflammation c/w resolving abscess. Microsatellite stable (MSS). [Dr.Mettu; [facility]]  # SEP 4th 2020 [compared to May 2020]  Interval increase in size of the metastases to the hepatic dome, The metastasis to the left hepatic lobe is unchanged; 2.  New subcentimeter hypoattenuating lesion in the inferior right hepatic lobe, incompletely characterized on CT. 3.  Postsurgical changes following right hemicolectomy.  # NGS/F-ONE-MUTATED K-RAS [G]  # PALLIATIVE CARE EVALUATION: 09/20/2019-[name]  # PAIN MANAGEMENT: NA   DIAGNOSIS: COLON CANCER  STAGE:  IV     ;  GOALS:Palliative  CURRENT/MOST RECENT THERAPY : FOLFOX+ avastin    Cancer of right colon ([place])  07/05/2019 Initial Diagnosis   Cancer of right colon ([place])   07/24/2019 -  Chemotherapy   The patient had palonosetron (ALOXI) injection 0.25 mg, 0.25 mg, Intravenous,  Once, 6 of 8 cycles Administration: 0.25 mg (07/24/2019), 0.25 mg (08/07/2019), 0.25 mg (08/21/2019), 0.25 mg (09/04/2019), 0.25 mg (09/20/2019) leucovorin 800 mg in dextrose 5 % 250 mL infusion, 844 mg, Intravenous,  Once, 6 of 8 cycles Administration: 800 mg (07/24/2019), 800 mg (08/07/2019), 800 mg (08/21/2019), 800 mg (09/04/2019), 800 mg  (09/20/2019) oxaliplatin (ELOXATIN) 180 mg in dextrose 5 % 500 mL chemo infusion, 85 mg/m2 = 180 mg, Intravenous,  Once, 6 of 8 cycles Administration: 180 mg (07/24/2019), 180 mg (08/07/2019), 180 mg (08/21/2019), 180 mg (09/04/2019), 180 mg (09/20/2019) fluorouracil (ADRUCIL) 5,000 mg in sodium chloride 0.9 % 150 mL chemo infusion, 5,050 mg, Intravenous, 1 Day/Dose, 6 of 8 cycles Administration: 5,000 mg (07/24/2019), 5,000 mg (08/07/2019), 5,000 mg (08/21/2019), 5,050 mg (09/04/2019) bevacizumab-bvzr (ZIRABEV) 400 mg in sodium chloride 0.9 % 100 mL chemo infusion, 5 mg/kg = 400 mg, Intravenous,  Once, 6 of 8 cycles Administration: 400 mg (08/07/2019), 400 mg (08/21/2019), 400 mg (09/04/2019), 400 mg (09/20/2019)  for chemotherapy treatment.      HISTORY OF PRESENTING ILLNESS:  Oscar [name] 78 y.o.  male with metastatic colon cancer to the liver currently on FOLFOX plus Avastin is here for follow-up.  Patient denies any headaches; denies any nausea vomiting.  Mild shoulder pain-on the left side.  Patient states his appetite is good.  Mild tingling numbness.   Review of Systems  Constitutional: Positive for malaise/fatigue. Negative for chills, diaphoresis, fever and weight loss.  HENT: Negative for nosebleeds and sore throat.   Eyes: Negative for double vision.  Respiratory: Negative for cough, hemoptysis, sputum production, shortness of breath and wheezing.   Cardiovascular: Negative for chest pain, palpitations, orthopnea and leg swelling.  Gastrointestinal: Negative for abdominal pain, blood in stool, constipation, diarrhea, heartburn, melena, nausea and vomiting.  Genitourinary: Negative for dysuria, frequency and urgency.  Musculoskeletal: Positive for back pain and joint pain.  Skin: Negative.  Negative for itching and rash.  Neurological: Negative for dizziness, tingling, focal weakness, weakness and headaches.  Endo/Heme/Allergies: Does not bruise/bleed easily.   Psychiatric/Behavioral: Negative  for depression. The patient is not nervous/anxious and does not have insomnia.      MEDICAL HISTORY:  Past Medical History:  Diagnosis Date  . Arthritis   . Diabetes mellitus without complication ([place])   . H/O colon cancer, stage IV   . Hyperlipemia   . Hypertension     SURGICAL HISTORY: Past Surgical History:  Procedure Laterality Date  . IR IMAGING GUIDED PORT INSERTION  07/20/2019  . JOINT REPLACEMENT      SOCIAL HISTORY: Social History   Socioeconomic History  . Marital status: Married    Spouse name: Not on file  . Number of children: Not on file  . Years of education: Not on file  . Highest education level: Not on file  Occupational History  . Not on file  Tobacco Use  . Smoking status: Current Every Day Smoker    Packs/day: 0.25    Types: Cigarettes  . Smokeless tobacco: Never Used  . Tobacco comment: 1 to 2 cigarettes a day occasionally  Substance and Sexual Activity  . Alcohol use: No  . Drug use: No  . Sexual activity: Not on file  Other Topics Concern  . Not on file  Social History Narrative   Recruitment consultant retd; lives in [place]; smoking 3cig/day; [3/4 ppd x started at 9 years]; no alcohol. Son & daughter; wife dementia [waiting for placement].    Social Determinants of Health   Financial Resource Strain:   . Difficulty of Paying Living Expenses: Not on file  Food Insecurity:   . Worried About Charity fundraiser in the Last Year: Not on file  . Ran Out of Food in the Last Year: Not on file  Transportation Needs:   . Lack of Transportation (Medical): Not on file  . Lack of Transportation (Non-Medical): Not on file  Physical Activity:   . Days of Exercise per Week: Not on file  . Minutes of Exercise per Session: Not on file  Stress:   . Feeling of Stress : Not on file  Social Connections:   . Frequency of Communication with Friends and Family: Not on file  . Frequency of Social Gatherings with  Friends and Family: Not on file  . Attends Religious Services: Not on file  . Active Member of Clubs or Organizations: Not on file  . Attends Archivist Meetings: Not on file  . Marital Status: Not on file  Intimate Partner Violence:   . Fear of Current or Ex-Partner: Not on file  . Emotionally Abused: Not on file  . Physically Abused: Not on file  . Sexually Abused: Not on file    FAMILY HISTORY: Family History  Problem Relation Age of Onset  . Peptic Ulcer Disease Father     ALLERGIES:  is allergic to ace inhibitors.  MEDICATIONS:  Current Outpatient Medications  Medication Sig Dispense Refill  . amLODipine (NORVASC) 5 MG tablet Take 5 mg by mouth daily.    . fluticasone (FLONASE) 50 MCG/ACT nasal spray Place 1 spray into both nostrils daily as needed.     . pravastatin (PRAVACHOL) 40 MG tablet Take 40 mg by mouth daily.    . tamsulosin (FLOMAX) 0.4 MG CAPS capsule Take 1 capsule by mouth daily.    Marland Kitchen lidocaine-prilocaine (EMLA) cream Apply 1 application topically as needed. (Patient not taking: Reported on 09/20/2019) 30 g 0  . ondansetron (ZOFRAN-ODT) 4 MG disintegrating tablet Take 1 tablet (4 mg total) by mouth every 6 (six) hours as  needed for nausea. (Patient not taking: Reported on 09/01/2019) 20 tablet 0  . prochlorperazine (COMPAZINE) 10 MG tablet Take 1 tablet (10 mg total) by mouth every 6 (six) hours as needed for nausea or vomiting. (Patient not taking: Reported on 09/01/2019) 40 tablet 1   No current facility-administered medications for this visit.   Facility-Administered Medications Ordered in Other Visits  Medication Dose Route Frequency Provider Last Rate Last Admin  . 0.9 %  sodium chloride infusion   Intravenous Continuous Cammie Sickle, MD      . bevacizumab-bvzr (ZIRABEV) 400 mg in sodium chloride 0.9 % 100 mL chemo infusion  5 mg/kg (Treatment Plan Recorded) Intravenous Once Cammie Sickle, MD      . dexamethasone (DECADRON)  injection 10 mg  10 mg Intravenous Once Charlaine Dalton R, MD      . dextrose 5 % solution   Intravenous Once Charlaine Dalton R, MD      . fluorouracil (ADRUCIL) 5,000 mg in sodium chloride 0.9 % 150 mL chemo infusion  2,369 mg/m2 (Treatment Plan Recorded) Intravenous 1 day or 1 dose Charlaine Dalton R, MD      . leucovorin 800 mg in dextrose 5 % 250 mL infusion  379 mg/m2 (Treatment Plan Recorded) Intravenous Once Cammie Sickle, MD      . oxaliplatin (ELOXATIN) 180 mg in dextrose 5 % 500 mL chemo infusion  85 mg/m2 (Treatment Plan Recorded) Intravenous Once Charlaine Dalton R, MD      . palonosetron (ALOXI) injection 0.25 mg  0.25 mg Intravenous Once Cammie Sickle, MD          .  PHYSICAL EXAMINATION: ECOG PERFORMANCE STATUS: 0 - Asymptomatic  Vitals:   10/04/19 0840  BP: 131/78  Pulse: 75  Resp: 20  Temp: (!) 97.5 F (36.4 C)   Filed Weights   10/04/19 0840  Weight: 184 lb (83.5 kg)    Physical Exam  Constitutional: He is oriented to person, place, and time and well-developed, well-nourished, and in no distress.  HENT:  Head: Normocephalic and atraumatic.  Mouth/Throat: Oropharynx is clear and moist. No oropharyngeal exudate.  Eyes: Pupils are equal, round, and reactive to light.  Cardiovascular: Normal rate and regular rhythm.  Pulmonary/Chest: No respiratory distress. He has no wheezes.  Abdominal: Soft. Bowel sounds are normal. He exhibits no distension and no mass. There is no abdominal tenderness. There is no rebound and no guarding.  Musculoskeletal:        General: No tenderness or edema. Normal range of motion.     Cervical back: Normal range of motion and neck supple.  Neurological: He is alert and oriented to person, place, and time.  Skin: Skin is warm.  Psychiatric: Affect normal.   LABORATORY DATA:  I have reviewed the data as listed Lab Results  Component Value Date   WBC 4.6 10/04/2019   HGB 12.3 (L) 10/04/2019   HCT 37.0  (L) 10/04/2019   MCV 96.6 10/04/2019   PLT 145 (L) 10/04/2019   Recent Labs    09/04/19 0805 09/20/19 0922 10/04/19 0818  NA 139 135 140  K 3.9 3.7 3.7  CL 109 108 109  CO2 '24 23 24  '$ GLUCOSE 167* 142* 156*  BUN '14 12 12  '$ CREATININE 0.77 0.74 0.75  CALCIUM 8.7* 8.3* 8.8*  GFRNONAA >60 >60 >60  GFRAA >60 >60 >60  PROT 6.7 7.0 6.5  ALBUMIN 3.5 3.7 3.5  AST '21 20 22  '$ ALT 13 14 15  ALKPHOS 101 91 90  BILITOT 0.8 0.7 0.7    RADIOGRAPHIC STUDIES: I have personally reviewed the radiological images as listed and agreed with the findings in the report. No results found.  ASSESSMENT & PLAN:   Cancer of right colon ([place]) #Right-sided colon adenocarcinoma-with synchronous metastasis to liver/unresectable.  Currently on FOLFOX plus Avastin.   #Proceed with today- cycle 6 of FOLFOX plus Avastin chemotherapy. Discussed re: rising CEA; await from today. Will plan CT scan prior to next cycle; ordered today.   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 STABLE.  Monitor for now.  # HTN- systolic 414Q; STABLE. discussed re: avastin.    # DISPOSITION: #FOLFOX+ Avastin today # follow up in 12/29 ; MD; labs- cbc/cmp/UA/ CEA; FOLFOX + avastin;pump off in 2 days.  CT C/A/P prior-  Dr.B  All questions were answered. The patient knows to call the clinic with any problems, questions or concerns.    Cammie Sickle, MD 10/04/2019 9:29 AM

## 2019-10-04 NOTE — Assessment & Plan Note (Addendum)
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  Currently on FOLFOX plus Avastin.   #Proceed with today- cycle 6 of FOLFOX plus Avastin chemotherapy. Discussed re: rising CEA; await from today. Will plan CT scan prior to next cycle; ordered today.   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 STABLE.  Monitor for now.  # HTN- systolic 0000000; STABLE. discussed re: avastin.    # DISPOSITION: #FOLFOX+ Avastin today # follow up in 12/29 ; MD; labs- cbc/cmp/UA/ CEA; FOLFOX + avastin;pump off in 2 days.  CT C/A/P prior-  Dr.B

## 2019-10-05 ENCOUNTER — Inpatient Hospital Stay (HOSPITAL_BASED_OUTPATIENT_CLINIC_OR_DEPARTMENT_OTHER): Payer: Medicare HMO | Admitting: Hospice and Palliative Medicine

## 2019-10-05 DIAGNOSIS — Z515 Encounter for palliative care: Secondary | ICD-10-CM | POA: Diagnosis not present

## 2019-10-05 DIAGNOSIS — C182 Malignant neoplasm of ascending colon: Secondary | ICD-10-CM

## 2019-10-05 LAB — CEA: CEA: 19.9 ng/mL — ABNORMAL HIGH (ref 0.0–4.7)

## 2019-10-05 NOTE — Progress Notes (Signed)
Hello-I wanted to inform you that- your CEA is slightly better at 19.9. will await on the CT scans as we discussed.   No new recommendations/follow-up as planned/call if any questions.  Thank you/Dr. B/MyChart message

## 2019-10-05 NOTE — Progress Notes (Signed)
Virtual Visit via Telephone Note  I connected with Oscar [name] on 10/05/19 at 10:30 AM EST by telephone and verified that I am speaking with the correct person using two identifiers.   I discussed the limitations, risks, security and privacy concerns of performing an evaluation and management service by telephone and the availability of in person appointments. I also discussed with the patient that there may be a patient responsible charge related to this service. The patient expressed understanding and agreed to proceed.   History of Present Illness: Oscar [name] is a 78 year old man with multiple medical problems including stage IV colorectal cancer currently on systemic treatment with FOLFOX.   Observations/Objective: I called and spoke with patient by phone.  Patient reports he is doing well.  He denies any acute changes or concerns.  Denies any distressing symptoms.  Reports appetite is good.  Sleeping well.  He feels he is coping well with both his treatment and cancer.  No issues with medications reported.  He did not require any refills today.  Assessment and Plan: Stage IV colorectal cancer -on treatment with FOLFOX.  Patient has follow-up appointment with Dr. Rogue Bussing on 12/28 with consideration for treatment at that time.  Follow Up Instructions: Follow-up telephone visit with me in 4 weeks   I discussed the assessment and treatment plan with the patient. The patient was provided an opportunity to ask questions and all were answered. The patient agreed with the plan and demonstrated an understanding of the instructions.   The patient was advised to call back or seek an in-person evaluation if the symptoms worsen or if the condition fails to improve as anticipated.  I provided 5 minutes of non-face-to-face time during this encounter.   [name], NP

## 2019-10-06 ENCOUNTER — Inpatient Hospital Stay: Payer: Medicare HMO

## 2019-10-06 ENCOUNTER — Other Ambulatory Visit: Payer: Self-pay

## 2019-10-06 VITALS — BP 172/79 | HR 85 | Temp 98.0°F | Resp 18

## 2019-10-06 DIAGNOSIS — C182 Malignant neoplasm of ascending colon: Secondary | ICD-10-CM | POA: Diagnosis not present

## 2019-10-06 DIAGNOSIS — Z7189 Other specified counseling: Secondary | ICD-10-CM

## 2019-10-06 DIAGNOSIS — C787 Secondary malignant neoplasm of liver and intrahepatic bile duct: Secondary | ICD-10-CM | POA: Diagnosis not present

## 2019-10-06 DIAGNOSIS — Z452 Encounter for adjustment and management of vascular access device: Secondary | ICD-10-CM | POA: Diagnosis not present

## 2019-10-06 DIAGNOSIS — Z5112 Encounter for antineoplastic immunotherapy: Secondary | ICD-10-CM | POA: Diagnosis not present

## 2019-10-06 DIAGNOSIS — Z5111 Encounter for antineoplastic chemotherapy: Secondary | ICD-10-CM | POA: Diagnosis not present

## 2019-10-06 MED ORDER — HEPARIN SOD (PORK) LOCK FLUSH 100 UNIT/ML IV SOLN
500.0000 [IU] | Freq: Once | INTRAVENOUS | Status: AC | PRN
  Administered 2019-10-06: 500 [IU]
  Filled 2019-10-06: qty 5

## 2019-10-06 MED ORDER — SODIUM CHLORIDE 0.9% FLUSH
10.0000 mL | INTRAVENOUS | Status: DC | PRN
  Administered 2019-10-06: 10 mL
  Filled 2019-10-06: qty 10

## 2019-10-06 MED ORDER — HEPARIN SOD (PORK) LOCK FLUSH 100 UNIT/ML IV SOLN
INTRAVENOUS | Status: AC
  Filled 2019-10-06: qty 5

## 2019-10-10 ENCOUNTER — Ambulatory Visit
Admission: RE | Admit: 2019-10-10 | Discharge: 2019-10-10 | Disposition: A | Discharge status: Home / Self Care | Payer: Medicare HMO | Source: Ambulatory Visit | Attending: Internal Medicine | Admitting: Internal Medicine

## 2019-10-10 ENCOUNTER — Other Ambulatory Visit: Payer: Self-pay

## 2019-10-10 DIAGNOSIS — C182 Malignant neoplasm of ascending colon: Secondary | ICD-10-CM | POA: Diagnosis not present

## 2019-10-10 DIAGNOSIS — C189 Malignant neoplasm of colon, unspecified: Secondary | ICD-10-CM | POA: Diagnosis not present

## 2019-10-10 HISTORY — DX: Malignant (primary) neoplasm, unspecified: C80.1

## 2019-10-10 IMAGING — CT CT ABD-PELV W/ CM
1 of 3 series · 11 of 32 positions shown, 17 images · IV contrast (omnipaque)
Comparison: Multiple exams, including [DATE] and CT chest from
[DATE]

CLINICAL DATA: Colon cancer restaging. Prior right hemicolectomy.
Chemotherapy for the last 2 weeks.

EXAM:
CT CHEST, ABDOMEN, AND PELVIS WITH CONTRAST
TECHNIQUE: Multidetector CT imaging of the chest, abdomen and pelvis was
performed following the standard protocol during bolus
administration of intravenous contrast.
CONTRAST:  100mL OMNIPAQUE IOHEXOL 300 MG/ML  SOLN

[Series 2: cap with · axial · 0.91mm/px · z∈[-971,-346]mm · 11 of 151 slices shown, 17 images]
[im 13/151  soft-tissue]
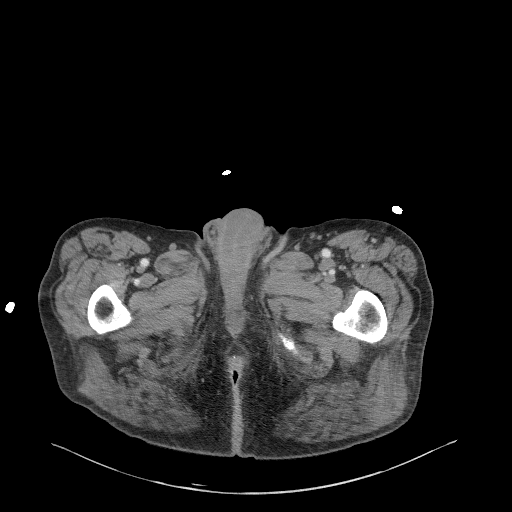
[im 13/151  bone]
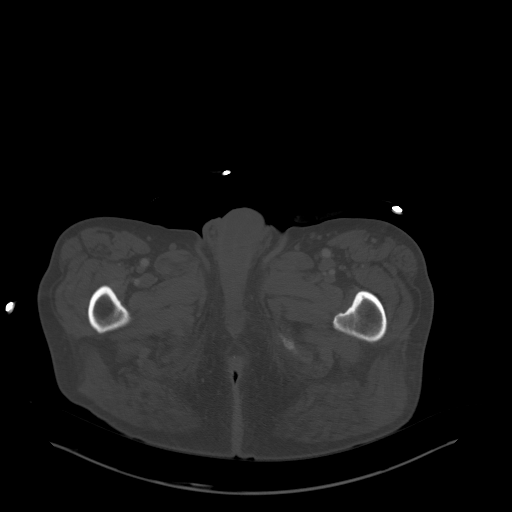
[im 26/151  soft-tissue]
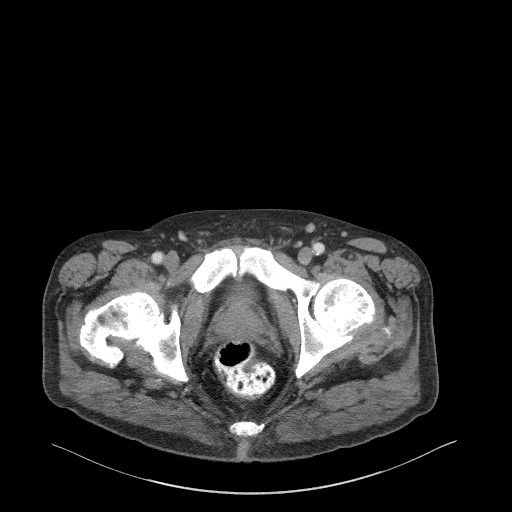
[im 38/151  soft-tissue]
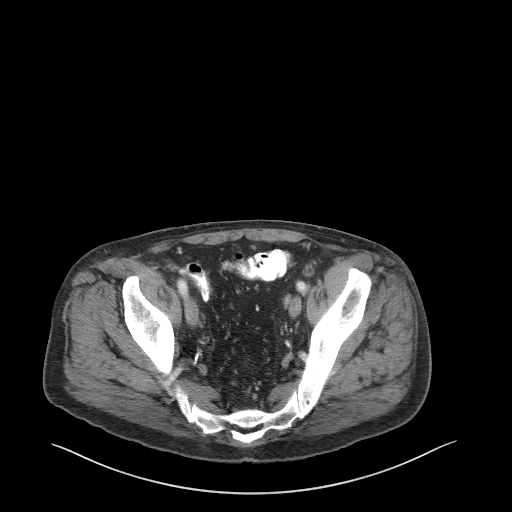
[im 51/151  soft-tissue]
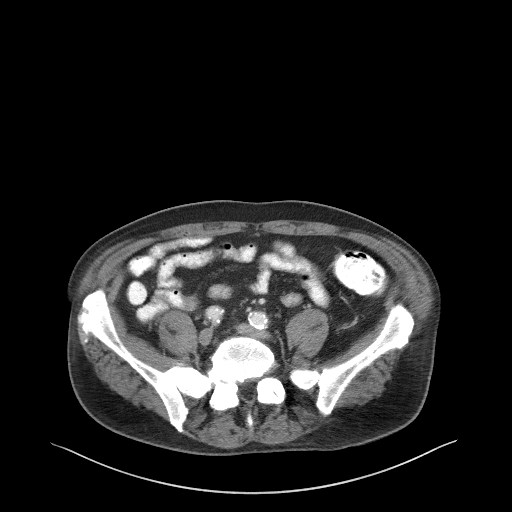
[im 63/151  soft-tissue]
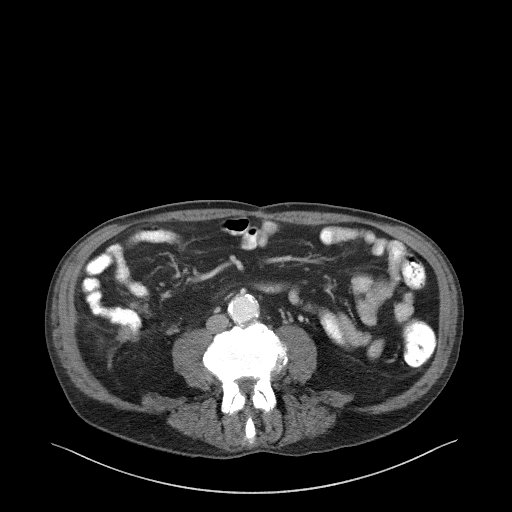
[im 76/151  soft-tissue]
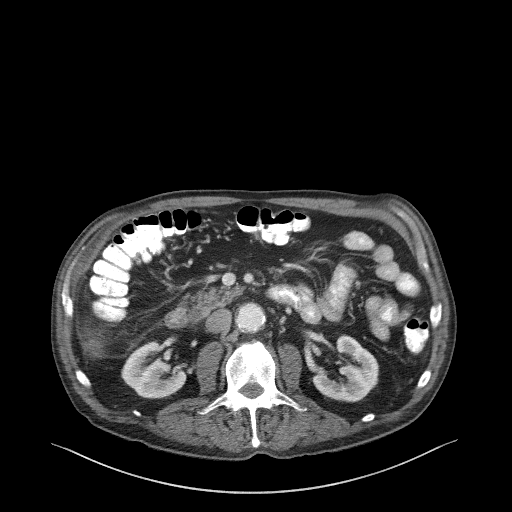
[im 88/151  soft-tissue]
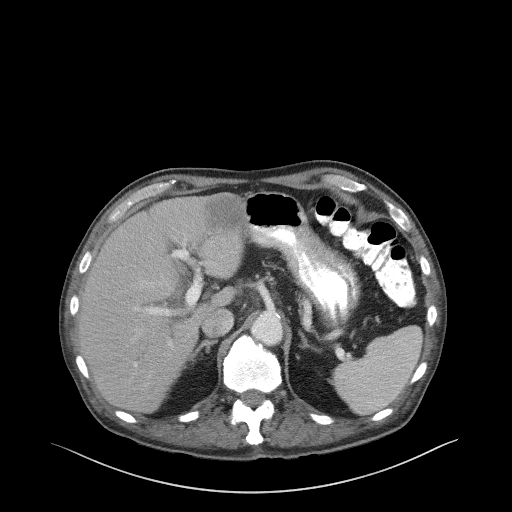
[im 101/151  soft-tissue]
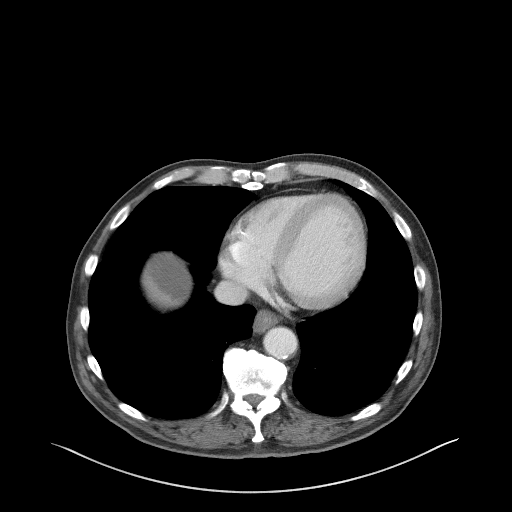
[im 101/151  lung]
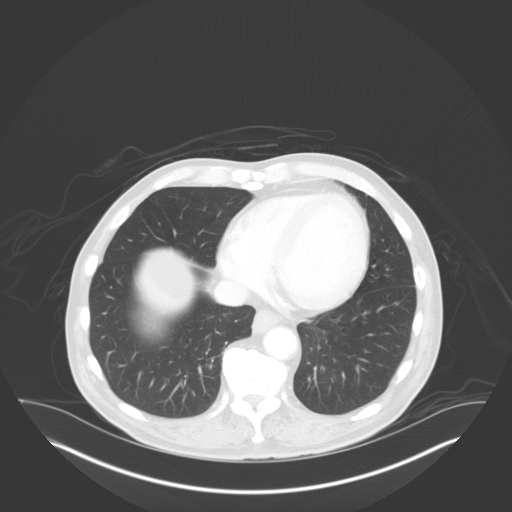
[im 113/151  soft-tissue]
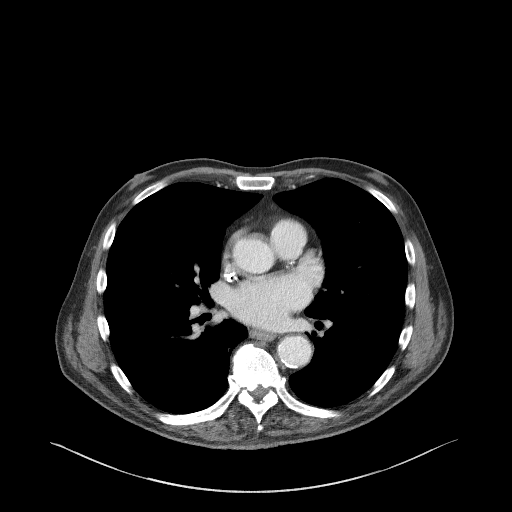
[im 113/151  lung]
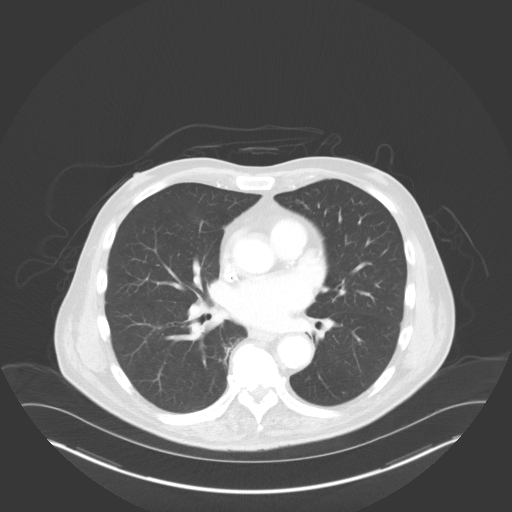
[im 113/151  bone]
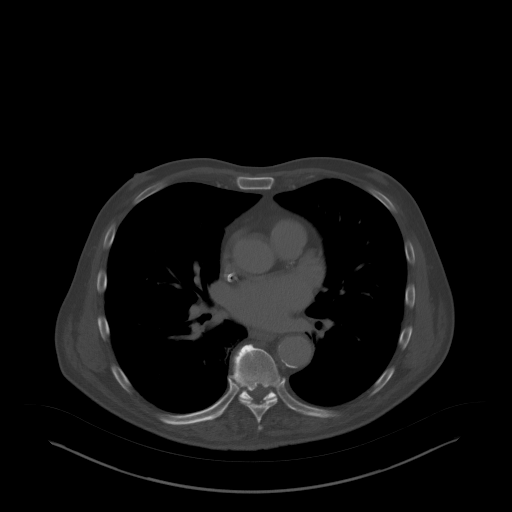
[im 126/151  soft-tissue]
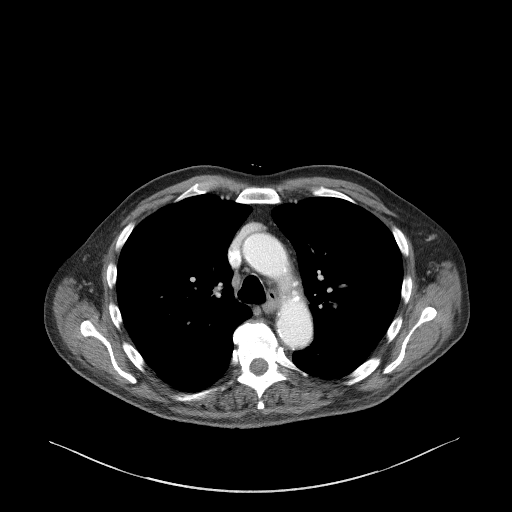
[im 126/151  lung]
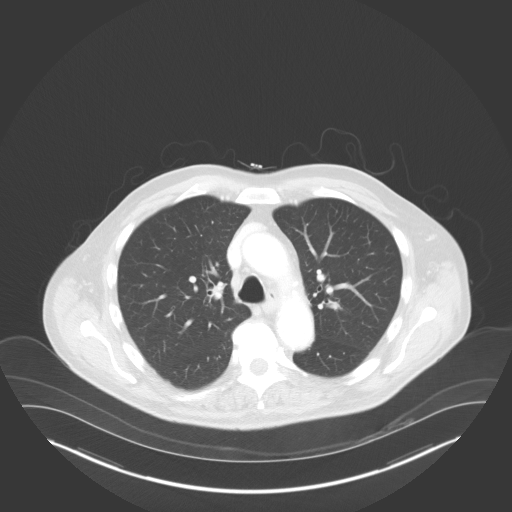
[im 138/151  soft-tissue]
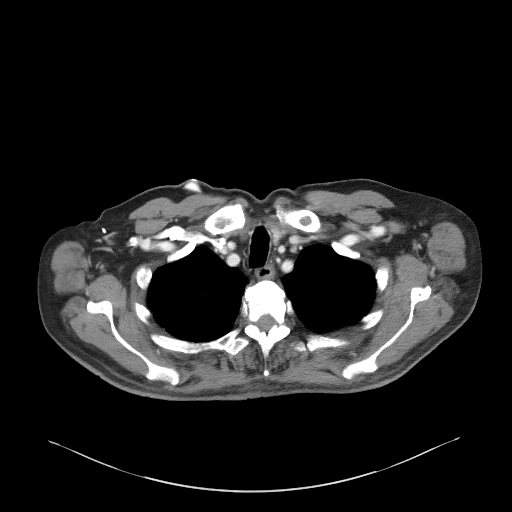
[im 138/151  lung]
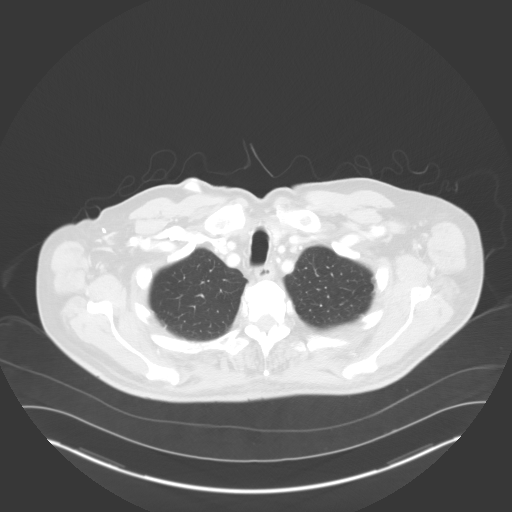

[11 of 32 positions shown; findings below may reference images not displayed]

FINDINGS: CT CHEST FINDINGS

Cardiovascular: Coronary, aortic arch, and branch vessel
atherosclerotic vascular disease. Right Port-A-Cath tip: Lower SVC.

Mediastinum/Nodes: No pathologic adenopathy in the chest. Mild
distal esophageal wall thickening, though as common cause would be
esophagitis.

Lungs/Pleura: Biapical pleuroparenchymal scarring. Small bands of
mucus in the right and left mainstem bronchi.

3 mm subpleural nodule in the left lower lobe on image 113/4, not
readily appreciable on prior exams.

Musculoskeletal: Thoracic spondylosis.

CT ABDOMEN PELVIS FINDINGS

Hepatobiliary: Solid lateral segment left hepatic lobe metastatic
lesion 4.6 by 3.9 cm on image 63/2, previously 5.2 by 4.4 cm, mildly
improved in size.

However, there is a newly apparent 1.2 by 1.1 cm hypodense lesion in
segment 6 of the liver on image 71/2 suspicious for metastatic
lesion.

Complex cystic lesion in the dome of the liver measuring 6.9 by
cm on image 56/2, previous more solid in appearance and previously
measuring 4.5 by 4.3 cm. Overall the liver appearance is mixed but
given the new lesion there is some concern for progression.

Stable mild common bile duct dilatation at 1.1 cm in diameter.

Pancreas: Unremarkable

Spleen: Unremarkable

Adrenals/Urinary Tract: Left kidney lower pole simple appearing
cyst. There are 2 nonobstructive left mid kidney calculi, the
largest 0.3 cm in diameter on image 51/5. Adrenal glands normal.

Stomach/Bowel: Right hemicolectomy.

Vascular/Lymphatic: Aortoiliac atherosclerotic vascular disease.
Infrarenal abdominal aortic ectasia at 2.8 cm with a small focal
dissection in the left side of the infrarenal abdominal aorta as
shown on image 79/5. No pathologic adenopathy is identified.

Reproductive: Mild prostatomegaly.

Other: No supplemental non-categorized findings.

Musculoskeletal: Bridging spurring of the right sacroiliac joint.
Lumbar spondylosis and degenerative disc disease.
IMPRESSION: 1. Although one of the dominant solid lesions in the left hepatic
lobe is reduced in size, there is a new right hepatic lobe
metastatic lesion as well as enlargement and cystic degeneration of
a dominant lesion in the dome of the liver. Overall the appearance
favors progression despite the improvement in 1 of the lesions.
2. No current adenopathy.
3. 3 mm new subpleural nodule in the left lower lobe could be
postinflammatory but merit surveillance.
4. Infrarenal abdominal aortic diameter 2.8 cm. Ectatic abdominal
aorta at risk for aneurysm development. Recommend followup by
ultrasound in 5 years. This recommendation follows ACR consensus
guidelines: White Paper of the ACR Incidental Findings Committee II
on Vascular Findings. [HOSPITAL] [[place]]; [DATE].
Aortic aneurysm NOS ([[place]]-[WA])
5. Infrarenal abdominal aortic focal dissection.
6. Other imaging findings of potential clinical significance: Aortic
Atherosclerosis ([[place]]-[WA]). Coronary atherosclerosis. Distal
esophageal wall thickening, probably from esophagitis. Stable mildly
dilated common bile duct, cause uncertain. Nonobstructive left
nephrolithiasis. Aortic Atherosclerosis ([[place]]-[WA]). Mild
prostatomegaly. Lumbar spondylosis and degenerative disc disease.

## 2019-10-10 IMAGING — CT CT CHEST W/ CM
2 of 3 series · 14 of 30 positions shown, 17 images · IV contrast (omnipaque)
Comparison: Multiple exams, including [DATE] and CT chest from
[DATE]

CLINICAL DATA: Colon cancer restaging. Prior right hemicolectomy.
Chemotherapy for the last 2 weeks.

EXAM:
CT CHEST, ABDOMEN, AND PELVIS WITH CONTRAST
TECHNIQUE: Multidetector CT imaging of the chest, abdomen and pelvis was
performed following the standard protocol during bolus
administration of intravenous contrast.
CONTRAST:  100mL OMNIPAQUE IOHEXOL 300 MG/ML  SOLN

[Series 2: cap with · axial · 0.91mm/px · z∈[-956,-356]mm · 10 of 151 slices shown, 13 images]
[im 16/151  mediastinal]
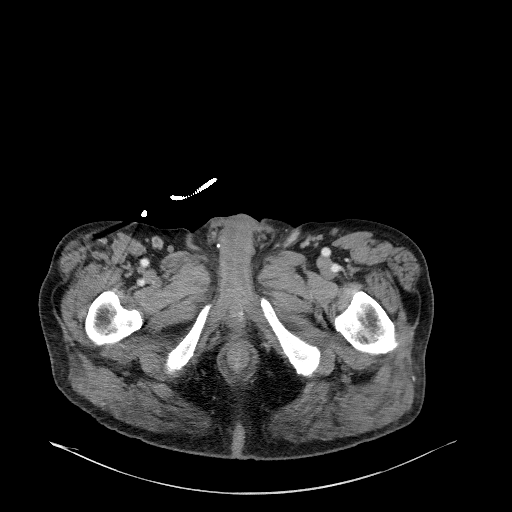
[im 16/151  lung]
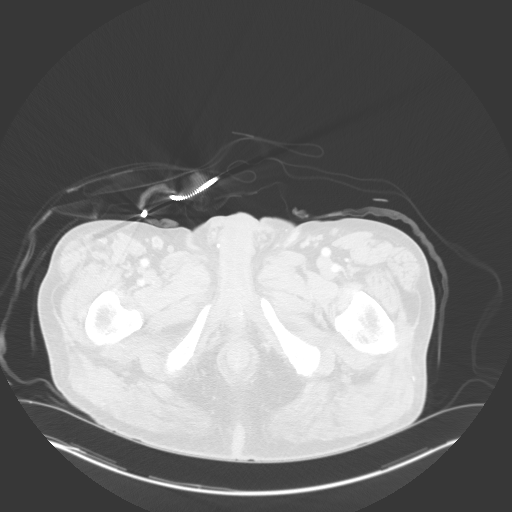
[im 31/151  lung]
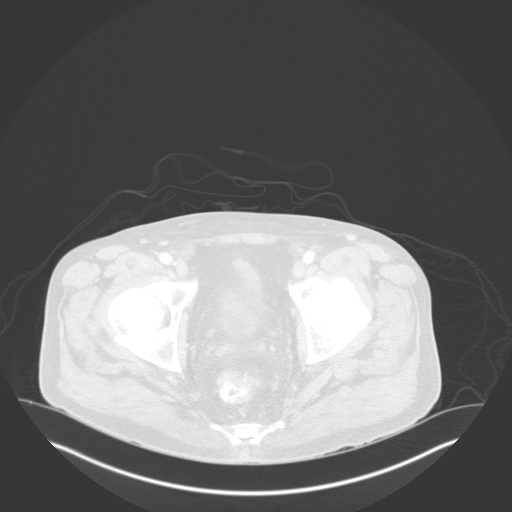
[im 46/151  lung]
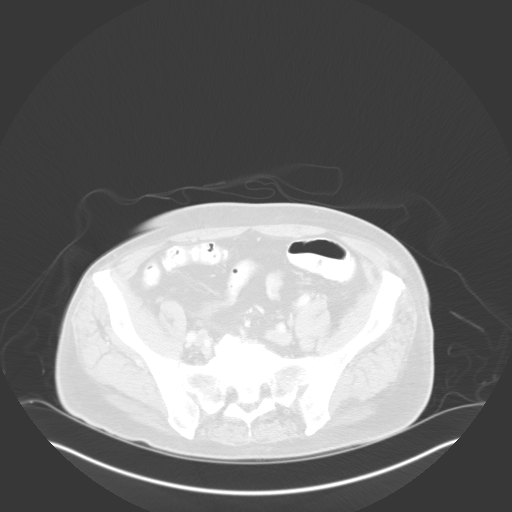
[im 61/151  lung]
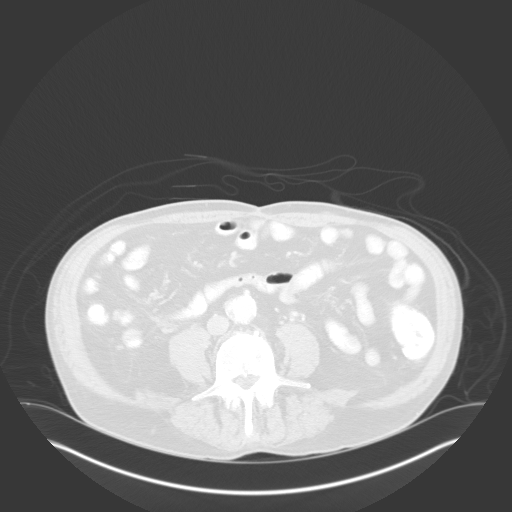
[im 74/151  mediastinal]
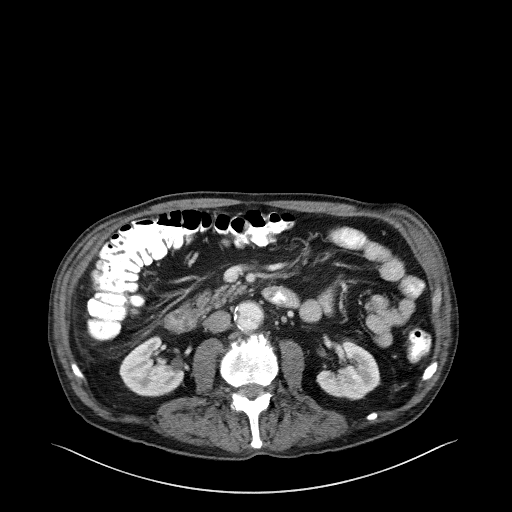
[im 74/151  lung]
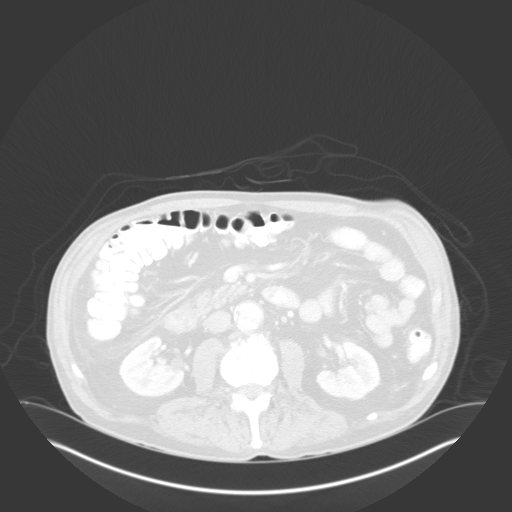
[im 76/151  lung]
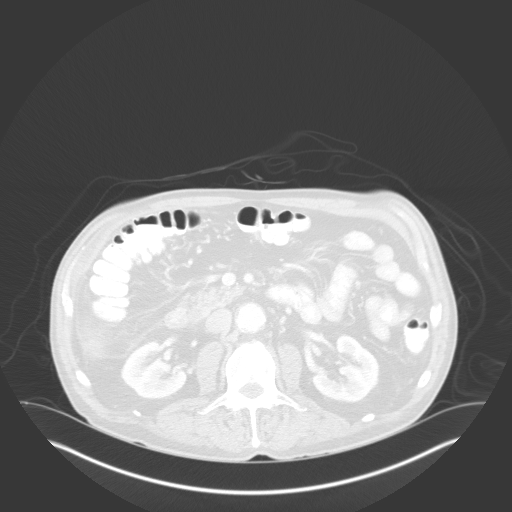
[im 91/151  lung]
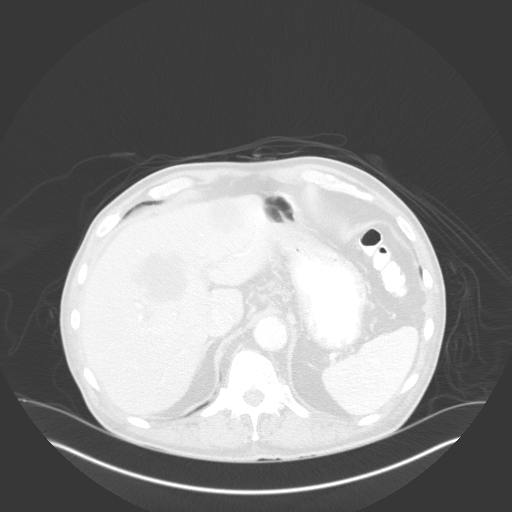
[im 106/151  lung]
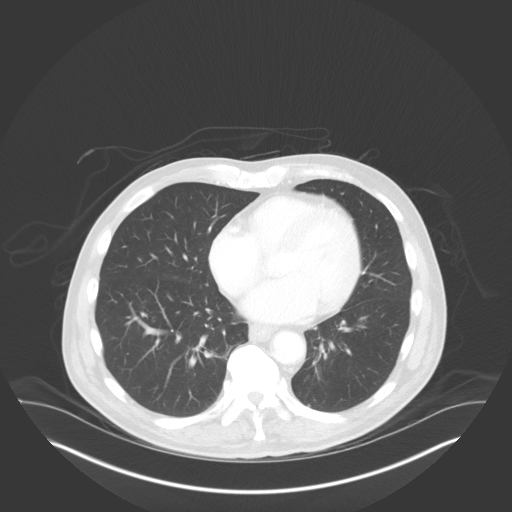
[im 121/151  mediastinal]
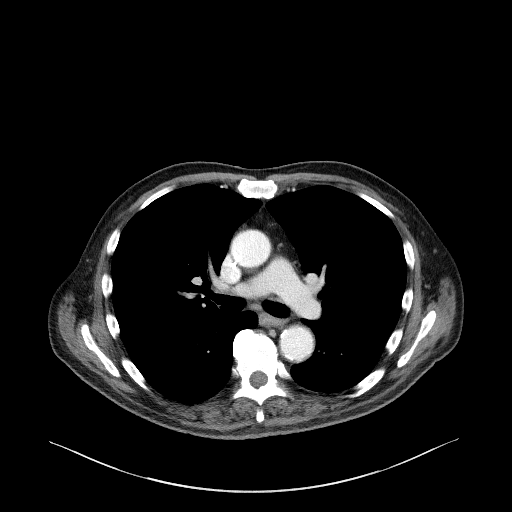
[im 121/151  lung]
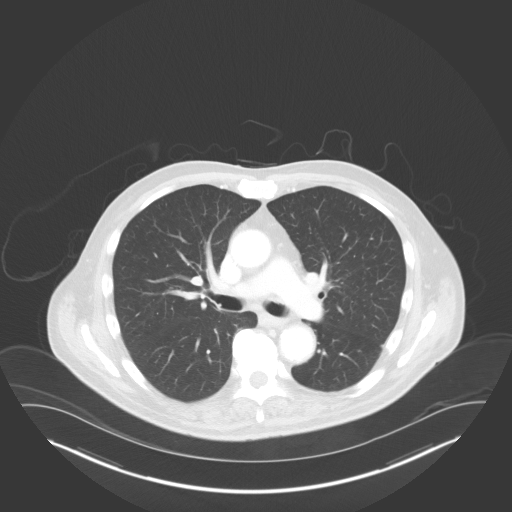
[im 136/151  lung]
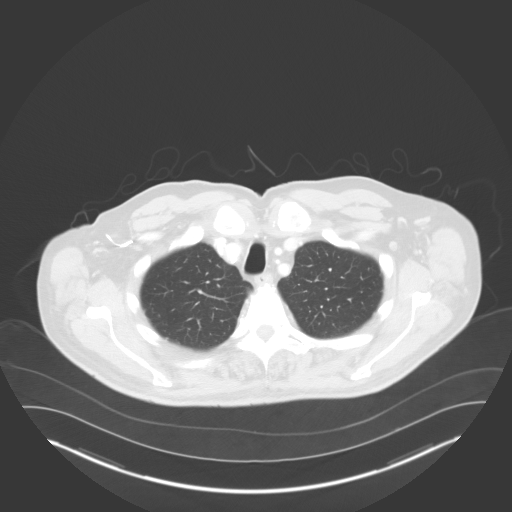

[Series 4: lung · axial · 0.91mm/px · z∈[-601,-485]mm · 4 of 175 slices shown]
[im 15/175  lung]
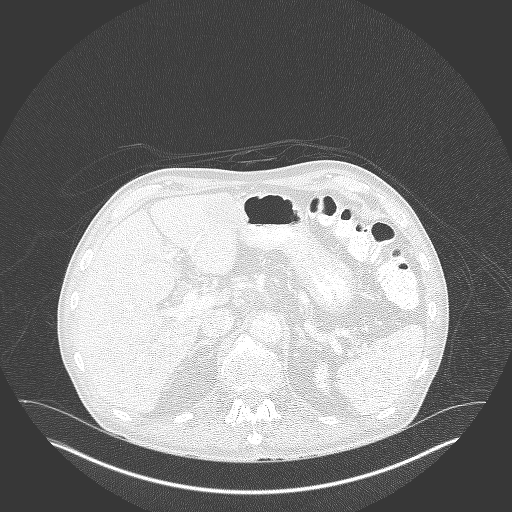
[im 44/175  lung]
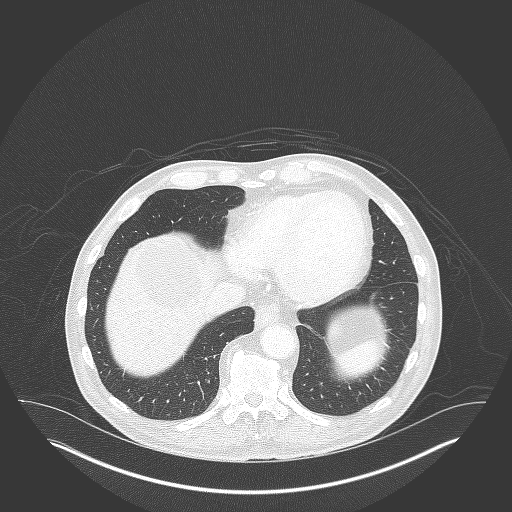
[im 59/175  lung]
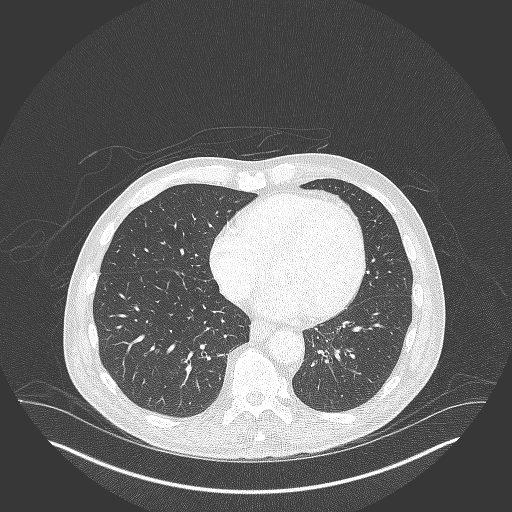
[im 73/175  lung]
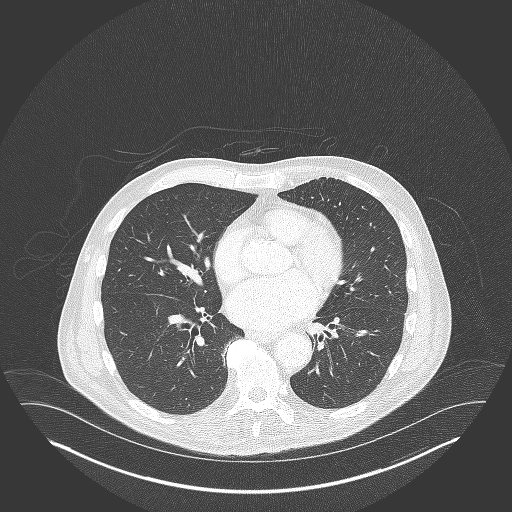

[14 of 30 positions shown; findings below may reference images not displayed]

FINDINGS: CT CHEST FINDINGS

Cardiovascular: Coronary, aortic arch, and branch vessel
atherosclerotic vascular disease. Right Port-A-Cath tip: Lower SVC.

Mediastinum/Nodes: No pathologic adenopathy in the chest. Mild
distal esophageal wall thickening, though as common cause would be
esophagitis.

Lungs/Pleura: Biapical pleuroparenchymal scarring. Small bands of
mucus in the right and left mainstem bronchi.

3 mm subpleural nodule in the left lower lobe on image 113/4, not
readily appreciable on prior exams.

Musculoskeletal: Thoracic spondylosis.

CT ABDOMEN PELVIS FINDINGS

Hepatobiliary: Solid lateral segment left hepatic lobe metastatic
lesion 4.6 by 3.9 cm on image 63/2, previously 5.2 by 4.4 cm, mildly
improved in size.

However, there is a newly apparent 1.2 by 1.1 cm hypodense lesion in
segment 6 of the liver on image 71/2 suspicious for metastatic
lesion.

Complex cystic lesion in the dome of the liver measuring 6.9 by
cm on image 56/2, previous more solid in appearance and previously
measuring 4.5 by 4.3 cm. Overall the liver appearance is mixed but
given the new lesion there is some concern for progression.

Stable mild common bile duct dilatation at 1.1 cm in diameter.

Pancreas: Unremarkable

Spleen: Unremarkable

Adrenals/Urinary Tract: Left kidney lower pole simple appearing
cyst. There are 2 nonobstructive left mid kidney calculi, the
largest 0.3 cm in diameter on image 51/5. Adrenal glands normal.

Stomach/Bowel: Right hemicolectomy.

Vascular/Lymphatic: Aortoiliac atherosclerotic vascular disease.
Infrarenal abdominal aortic ectasia at 2.8 cm with a small focal
dissection in the left side of the infrarenal abdominal aorta as
shown on image 79/5. No pathologic adenopathy is identified.

Reproductive: Mild prostatomegaly.

Other: No supplemental non-categorized findings.

Musculoskeletal: Bridging spurring of the right sacroiliac joint.
Lumbar spondylosis and degenerative disc disease.
IMPRESSION: 1. Although one of the dominant solid lesions in the left hepatic
lobe is reduced in size, there is a new right hepatic lobe
metastatic lesion as well as enlargement and cystic degeneration of
a dominant lesion in the dome of the liver. Overall the appearance
favors progression despite the improvement in 1 of the lesions.
2. No current adenopathy.
3. 3 mm new subpleural nodule in the left lower lobe could be
postinflammatory but merit surveillance.
4. Infrarenal abdominal aortic diameter 2.8 cm. Ectatic abdominal
aorta at risk for aneurysm development. Recommend followup by
ultrasound in 5 years. This recommendation follows ACR consensus
guidelines: White Paper of the ACR Incidental Findings Committee II
on Vascular Findings. [HOSPITAL] [[place]]; [DATE].
Aortic aneurysm NOS ([WA]-[WA])
5. Infrarenal abdominal aortic focal dissection.
6. Other imaging findings of potential clinical significance: Aortic
Atherosclerosis ([[place]]-[WA]). Coronary atherosclerosis. Distal
esophageal wall thickening, probably from esophagitis. Stable mildly
dilated common bile duct, cause uncertain. Nonobstructive left
nephrolithiasis. Aortic Atherosclerosis ([WA]-[WA]). Mild
prostatomegaly. Lumbar spondylosis and degenerative disc disease.

## 2019-10-10 MED ORDER — IOHEXOL 300 MG/ML  SOLN
100.0000 mL | Freq: Once | INTRAMUSCULAR | Status: AC | PRN
  Administered 2019-10-10: 100 mL via INTRAVENOUS

## 2019-10-12 ENCOUNTER — Encounter: Payer: Self-pay | Admitting: Internal Medicine

## 2019-10-12 ENCOUNTER — Other Ambulatory Visit: Payer: Self-pay

## 2019-10-16 ENCOUNTER — Inpatient Hospital Stay: Payer: Medicare HMO

## 2019-10-16 ENCOUNTER — Inpatient Hospital Stay: Payer: Medicare HMO | Admitting: Internal Medicine

## 2019-10-16 ENCOUNTER — Other Ambulatory Visit: Payer: Self-pay

## 2019-10-16 DIAGNOSIS — C182 Malignant neoplasm of ascending colon: Secondary | ICD-10-CM

## 2019-10-16 DIAGNOSIS — C787 Secondary malignant neoplasm of liver and intrahepatic bile duct: Secondary | ICD-10-CM | POA: Diagnosis not present

## 2019-10-16 DIAGNOSIS — Z452 Encounter for adjustment and management of vascular access device: Secondary | ICD-10-CM | POA: Diagnosis not present

## 2019-10-16 DIAGNOSIS — Z7189 Other specified counseling: Secondary | ICD-10-CM

## 2019-10-16 DIAGNOSIS — Z5112 Encounter for antineoplastic immunotherapy: Secondary | ICD-10-CM | POA: Diagnosis not present

## 2019-10-16 DIAGNOSIS — Z5111 Encounter for antineoplastic chemotherapy: Secondary | ICD-10-CM | POA: Diagnosis not present

## 2019-10-16 LAB — URINALYSIS, COMPLETE (UACMP) WITH MICROSCOPIC
Bacteria, UA: NONE SEEN
Bilirubin Urine: NEGATIVE
Glucose, UA: NEGATIVE mg/dL
Hgb urine dipstick: NEGATIVE
Ketones, ur: 5 mg/dL — AB
Leukocytes,Ua: NEGATIVE
Nitrite: NEGATIVE
Protein, ur: 30 mg/dL — AB
Specific Gravity, Urine: 1.032 — ABNORMAL HIGH (ref 1.005–1.030)
pH: 5 (ref 5.0–8.0)

## 2019-10-16 LAB — COMPREHENSIVE METABOLIC PANEL
ALT: 13 U/L (ref 0–44)
AST: 22 U/L (ref 15–41)
Albumin: 3.4 g/dL — ABNORMAL LOW (ref 3.5–5.0)
Alkaline Phosphatase: 94 U/L (ref 38–126)
Anion gap: 6 (ref 5–15)
BUN: 11 mg/dL (ref 8–23)
CO2: 25 mmol/L (ref 22–32)
Calcium: 8.6 mg/dL — ABNORMAL LOW (ref 8.9–10.3)
Chloride: 110 mmol/L (ref 98–111)
Creatinine, Ser: 0.67 mg/dL (ref 0.61–1.24)
GFR calc Af Amer: >60 mL/min (ref >60)
GFR calc non Af Amer: >60 mL/min (ref >60)
Glucose, Bld: 133 mg/dL — ABNORMAL HIGH (ref 70–99)
Potassium: 3.5 mmol/L (ref 3.5–5.1)
Sodium: 141 mmol/L (ref 135–145)
Total Bilirubin: 0.6 mg/dL (ref 0.3–1.2)
Total Protein: 6.6 g/dL (ref 6.5–8.1)

## 2019-10-16 LAB — CBC WITH DIFFERENTIAL/PLATELET
Abs Immature Granulocytes: 0 10*3/uL (ref 0.00–0.07)
Basophils Absolute: 0 10*3/uL (ref 0.0–0.1)
Basophils Relative: 1 %
Eosinophils Absolute: 0.4 10*3/uL (ref 0.0–0.5)
Eosinophils Relative: 9 %
HCT: 37.2 % — ABNORMAL LOW (ref 39.0–52.0)
Hemoglobin: 12.2 g/dL — ABNORMAL LOW (ref 13.0–17.0)
Immature Granulocytes: 0 %
Lymphocytes Relative: 34 %
Lymphs Abs: 1.5 10*3/uL (ref 0.7–4.0)
MCH: 31.9 pg (ref 26.0–34.0)
MCHC: 32.8 g/dL (ref 30.0–36.0)
MCV: 97.1 fL (ref 80.0–100.0)
Monocytes Absolute: 0.6 10*3/uL (ref 0.1–1.0)
Monocytes Relative: 14 %
Neutro Abs: 1.8 10*3/uL (ref 1.7–7.7)
Neutrophils Relative %: 42 %
Platelets: 144 10*3/uL — ABNORMAL LOW (ref 150–400)
RBC: 3.83 MIL/uL — ABNORMAL LOW (ref 4.22–5.81)
RDW: 14.9 % (ref 11.5–15.5)
WBC: 4.3 10*3/uL (ref 4.0–10.5)
nRBC: 0 % (ref 0.0–0.2)

## 2019-10-16 MED ORDER — SODIUM CHLORIDE 0.9 % IV SOLN
5.0000 mg/kg | Freq: Once | INTRAVENOUS | Status: AC
  Administered 2019-10-16: 400 mg via INTRAVENOUS
  Filled 2019-10-16: qty 16

## 2019-10-16 MED ORDER — SODIUM CHLORIDE 0.9 % IV SOLN
2369.0000 mg/m2 | INTRAVENOUS | Status: DC
  Administered 2019-10-16: 5000 mg via INTRAVENOUS
  Filled 2019-10-16: qty 100

## 2019-10-16 MED ORDER — OXALIPLATIN CHEMO INJECTION 100 MG/20ML
85.0000 mg/m2 | Freq: Once | INTRAVENOUS | Status: AC
  Administered 2019-10-16: 11:00:00 180 mg via INTRAVENOUS
  Filled 2019-10-16: qty 20

## 2019-10-16 MED ORDER — PALONOSETRON HCL INJECTION 0.25 MG/5ML
0.2500 mg | Freq: Once | INTRAVENOUS | Status: AC
  Administered 2019-10-16: 10:00:00 0.25 mg via INTRAVENOUS
  Filled 2019-10-16: qty 5

## 2019-10-16 MED ORDER — LEUCOVORIN CALCIUM INJECTION 350 MG
379.0000 mg/m2 | Freq: Once | INTRAVENOUS | Status: AC
  Administered 2019-10-16: 800 mg via INTRAVENOUS
  Filled 2019-10-16: qty 25

## 2019-10-16 MED ORDER — SODIUM CHLORIDE 0.9% FLUSH
10.0000 mL | Freq: Once | INTRAVENOUS | Status: AC
  Administered 2019-10-16: 10 mL via INTRAVENOUS
  Filled 2019-10-16: qty 10

## 2019-10-16 MED ORDER — SODIUM CHLORIDE 0.9 % IV SOLN
Freq: Once | INTRAVENOUS | Status: AC
  Filled 2019-10-16: qty 250

## 2019-10-16 MED ORDER — HEPARIN SOD (PORK) LOCK FLUSH 100 UNIT/ML IV SOLN
500.0000 [IU] | Freq: Once | INTRAVENOUS | Status: DC
  Filled 2019-10-16: qty 5

## 2019-10-16 MED ORDER — DEXAMETHASONE SODIUM PHOSPHATE 10 MG/ML IJ SOLN
10.0000 mg | Freq: Once | INTRAMUSCULAR | Status: AC
  Administered 2019-10-16: 10 mg via INTRAVENOUS
  Filled 2019-10-16: qty 1

## 2019-10-16 MED ORDER — DEXTROSE 5 % IV SOLN
Freq: Once | INTRAVENOUS | Status: AC
  Filled 2019-10-16: qty 250

## 2019-10-16 NOTE — Progress Notes (Signed)
[facility] CONSULT NOTE  Patient Care Team: Sofie Hartigan, MD as PCP - General (Family Medicine)  CHIEF COMPLAINTS/PURPOSE OF CONSULTATION: Colon cancer  #  Oncology History Overview Note  # MAY 2020- 3. 03/09/19 Liver biopsy. Microscopic examination shows malignant cells with glandular architecture consistent with adenocarcinoma. The malignant cells are positive for CK20 and CDX-2. These findings support the clinical impression of metastatic colon adenocarcinoma. 4. 03/10/19 R hemicolectomy. Tumor site cecum. Adenocarcinoma. Mucinous features present. G2. No tumor deposits. Invades visceral peritoneum. No tumor perforation. LVI present. PNI not identified. All margins uninvolved. 1/12 LNs. PT4apN1. Periappendiceal inflammation c/w resolving abscess. Microsatellite stable (MSS). [Dr.Mettu; [facility]]  # SEP 4th 2020 [compared to May 2020]  Interval increase in size of the metastases to the hepatic dome, The metastasis to the left hepatic lobe is unchanged; 2.  New subcentimeter hypoattenuating lesion in the inferior right hepatic lobe, incompletely characterized on CT. 3.  Postsurgical changes following right hemicolectomy.  # OCT 2020- FOLFOX +avastin; CT dec 22nd 2020- [compared to [facility] sep 9th 2020]-Liver- slight progression versus stable disease.  # NGS/F-ONE-MUTATED K-RAS [G]  # PALLIATIVE CARE EVALUATION: 09/20/2019-Oscar [name]  # PAIN MANAGEMENT: NA   DIAGNOSIS: COLON CANCER  STAGE:  IV     ;  GOALS:Palliative  CURRENT/MOST RECENT THERAPY : FOLFOX+ avastin [C]    Cancer of right colon ([place])  07/05/2019 Initial Diagnosis   Cancer of right colon ([place])   07/24/2019 -  Chemotherapy   The patient had palonosetron (ALOXI) injection 0.25 mg, 0.25 mg, Intravenous,  Once, 7 of 8 cycles Administration: 0.25 mg (07/24/2019), 0.25 mg (08/07/2019), 0.25 mg (08/21/2019), 0.25 mg (09/04/2019), 0.25 mg (09/20/2019), 0.25 mg (10/04/2019), 0.25 mg (10/16/2019) leucovorin 800 mg in dextrose  5 % 250 mL infusion, 844 mg, Intravenous,  Once, 7 of 8 cycles Administration: 800 mg (07/24/2019), 800 mg (08/07/2019), 800 mg (08/21/2019), 800 mg (09/04/2019), 800 mg (09/20/2019), 800 mg (10/04/2019) oxaliplatin (ELOXATIN) 180 mg in dextrose 5 % 500 mL chemo infusion, 85 mg/m2 = 180 mg, Intravenous,  Once, 7 of 8 cycles Administration: 180 mg (07/24/2019), 180 mg (08/07/2019), 180 mg (08/21/2019), 180 mg (09/04/2019), 180 mg (09/20/2019), 180 mg (10/04/2019) fluorouracil (ADRUCIL) 5,000 mg in sodium chloride 0.9 % 150 mL chemo infusion, 5,050 mg, Intravenous, 1 Day/Dose, 7 of 8 cycles Administration: 5,000 mg (07/24/2019), 5,000 mg (08/07/2019), 5,000 mg (08/21/2019), 5,050 mg (09/04/2019), 5,000 mg (10/04/2019) bevacizumab-bvzr (ZIRABEV) 400 mg in sodium chloride 0.9 % 100 mL chemo infusion, 5 mg/kg = 400 mg, Intravenous,  Once, 7 of 8 cycles Administration: 400 mg (08/07/2019), 400 mg (08/21/2019), 400 mg (09/04/2019), 400 mg (09/20/2019), 400 mg (10/04/2019)  for chemotherapy treatment.      HISTORY OF PRESENTING ILLNESS:  Oscar [name] 78 y.o.  male with metastatic colon cancer to the liver currently on FOLFOX plus Avastin is here for follow-up/review results of the CT scan.  Patient complains of mild left shoulder pain/joint pain.  Worse with movement.  Otherwise no swelling in the arm.  Appetite is good.  No significant nausea vomiting.   Review of Systems  Constitutional: Positive for malaise/fatigue. Negative for chills, diaphoresis, fever and weight loss.  HENT: Negative for nosebleeds and sore throat.   Eyes: Negative for double vision.  Respiratory: Negative for cough, hemoptysis, sputum production, shortness of breath and wheezing.   Cardiovascular: Negative for chest pain, palpitations, orthopnea and leg swelling.  Gastrointestinal: Negative for abdominal pain, blood in stool, constipation, diarrhea, heartburn, melena, nausea and vomiting.  Genitourinary: Negative for dysuria,  frequency and urgency.  Musculoskeletal: Positive for back pain and joint pain.  Skin: Negative.  Negative for itching and rash.  Neurological: Negative for dizziness, tingling, focal weakness, weakness and headaches.  Endo/Heme/Allergies: Does not bruise/bleed easily.  Psychiatric/Behavioral: Negative for depression. The patient is not nervous/anxious and does not have insomnia.      MEDICAL HISTORY:  Past Medical History:  Diagnosis Date  . Arthritis   . Cancer ([place])    colon cancer 02/2019 per pt   . Diabetes mellitus without complication ([place])   . H/O colon cancer, stage IV   . Hyperlipemia   . Hypertension     SURGICAL HISTORY: Past Surgical History:  Procedure Laterality Date  . IR IMAGING GUIDED PORT INSERTION  07/20/2019  . JOINT REPLACEMENT      SOCIAL HISTORY: Social History   Socioeconomic History  . Marital status: Married    Spouse name: Not on file  . Number of children: Not on file  . Years of education: Not on file  . Highest education level: Not on file  Occupational History  . Not on file  Tobacco Use  . Smoking status: Current Every Day Smoker    Packs/day: 0.25    Types: Cigarettes  . Smokeless tobacco: Never Used  . Tobacco comment: 1 to 2 cigarettes a day occasionally  Substance and Sexual Activity  . Alcohol use: No  . Drug use: No  . Sexual activity: Not on file  Other Topics Concern  . Not on file  Social History Narrative   Recruitment consultant retd; lives in [place]; smoking 3cig/day; [3/4 ppd x started at 9 years]; no alcohol. Son & daughter; wife dementia [waiting for placement].    Social Determinants of Health   Financial Resource Strain:   . Difficulty of Paying Living Expenses: Not on file  Food Insecurity:   . Worried About Charity fundraiser in the Last Year: Not on file  . Ran Out of Food in the Last Year: Not on file  Transportation Needs:   . Lack of Transportation (Medical): Not on file  . Lack of  Transportation (Non-Medical): Not on file  Physical Activity:   . Days of Exercise per Week: Not on file  . Minutes of Exercise per Session: Not on file  Stress:   . Feeling of Stress : Not on file  Social Connections:   . Frequency of Communication with Friends and Family: Not on file  . Frequency of Social Gatherings with Friends and Family: Not on file  . Attends Religious Services: Not on file  . Active Member of Clubs or Organizations: Not on file  . Attends Archivist Meetings: Not on file  . Marital Status: Not on file  Intimate Partner Violence:   . Fear of Current or Ex-Partner: Not on file  . Emotionally Abused: Not on file  . Physically Abused: Not on file  . Sexually Abused: Not on file    FAMILY HISTORY: Family History  Problem Relation Age of Onset  . Peptic Ulcer Disease Father     ALLERGIES:  is allergic to ace inhibitors.  MEDICATIONS:  Current Outpatient Medications  Medication Sig Dispense Refill  . amLODipine (NORVASC) 5 MG tablet Take 5 mg by mouth daily.    . fluticasone (FLONASE) 50 MCG/ACT nasal spray Place 1 spray into both nostrils daily as needed.     . pravastatin (PRAVACHOL) 40 MG tablet Take 40 mg by mouth daily.    Marland Kitchen  tamsulosin (FLOMAX) 0.4 MG CAPS capsule Take 1 capsule by mouth daily.    Marland Kitchen lidocaine-prilocaine (EMLA) cream Apply 1 application topically as needed. (Patient not taking: Reported on 09/20/2019) 30 g 0  . ondansetron (ZOFRAN-ODT) 4 MG disintegrating tablet Take 1 tablet (4 mg total) by mouth every 6 (six) hours as needed for nausea. (Patient not taking: Reported on 09/01/2019) 20 tablet 0  . prochlorperazine (COMPAZINE) 10 MG tablet Take 1 tablet (10 mg total) by mouth every 6 (six) hours as needed for nausea or vomiting. (Patient not taking: Reported on 09/01/2019) 40 tablet 1   No current facility-administered medications for this visit.   Facility-Administered Medications Ordered in Other Visits  Medication Dose Route  Frequency Provider Last Rate Last Admin  . fluorouracil (ADRUCIL) 5,000 mg in sodium chloride 0.9 % 150 mL chemo infusion  2,369 mg/m2 (Treatment Plan Recorded) Intravenous 1 day or 1 dose Charlaine Dalton R, MD      . heparin lock flush 100 unit/mL  500 Units Intravenous Once Cammie Sickle, MD          .  PHYSICAL EXAMINATION: ECOG PERFORMANCE STATUS: 0 - Asymptomatic  Vitals:   10/16/19 0845  BP: (!) 150/83  Pulse: 63  Resp: 20  Temp: (!) 97.4 F (36.3 C)   Filed Weights   10/16/19 0845  Weight: 183 lb 6.4 oz (83.2 kg)    Physical Exam  Constitutional: He is oriented to person, place, and time and well-developed, well-nourished, and in no distress.  HENT:  Head: Normocephalic and atraumatic.  Mouth/Throat: Oropharynx is clear and moist. No oropharyngeal exudate.  Eyes: Pupils are equal, round, and reactive to light.  Cardiovascular: Normal rate and regular rhythm.  Pulmonary/Chest: No respiratory distress. He has no wheezes.  Abdominal: Soft. Bowel sounds are normal. He exhibits no distension and no mass. There is no abdominal tenderness. There is no rebound and no guarding.  Musculoskeletal:        General: No tenderness or edema. Normal range of motion.     Cervical back: Normal range of motion and neck supple.  Neurological: He is alert and oriented to person, place, and time.  Skin: Skin is warm.  Psychiatric: Affect normal.   LABORATORY DATA:  I have reviewed the data as listed Lab Results  Component Value Date   WBC 4.3 10/16/2019   HGB 12.2 (L) 10/16/2019   HCT 37.2 (L) 10/16/2019   MCV 97.1 10/16/2019   PLT 144 (L) 10/16/2019   Recent Labs    09/20/19 0922 10/04/19 0818 10/16/19 0832  NA 135 140 141  K 3.7 3.7 3.5  CL 108 109 110  CO2 '23 24 25  '$ GLUCOSE 142* 156* 133*  BUN '12 12 11  '$ CREATININE 0.74 0.75 0.67  CALCIUM 8.3* 8.8* 8.6*  GFRNONAA >60 >60 >60  GFRAA >60 >60 >60  PROT 7.0 6.5 6.6  ALBUMIN 3.7 3.5 3.4*  AST '20 22 22  '$ ALT  '14 15 13  '$ ALKPHOS 91 90 94  BILITOT 0.7 0.7 0.6    RADIOGRAPHIC STUDIES: I have personally reviewed the radiological images as listed and agreed with the findings in the report. CT Chest W Contrast  Result Date: 10/10/2019 CLINICAL DATA:  Colon cancer restaging. Prior right hemicolectomy. Chemotherapy for the last 2 weeks. EXAM: CT CHEST, ABDOMEN, AND PELVIS WITH CONTRAST TECHNIQUE: Multidetector CT imaging of the chest, abdomen and pelvis was performed following the standard protocol during bolus administration of intravenous contrast. CONTRAST:  149m OMNIPAQUE  IOHEXOL 300 MG/ML  SOLN COMPARISON:  Multiple exams, including 03/06/2019 and CT chest from 03/06/2016 FINDINGS: CT CHEST FINDINGS Cardiovascular: Coronary, aortic arch, and branch vessel atherosclerotic vascular disease. Right Port-A-Cath tip: Lower SVC. Mediastinum/Nodes: No pathologic adenopathy in the chest. Mild distal esophageal wall thickening, though as common cause would be esophagitis. Lungs/Pleura: Biapical pleuroparenchymal scarring. Small bands of mucus in the right and left mainstem bronchi. 3 mm subpleural nodule in the left lower lobe on image 113/4, not readily appreciable on prior exams. Musculoskeletal: Thoracic spondylosis. CT ABDOMEN PELVIS FINDINGS Hepatobiliary: Solid lateral segment left hepatic lobe metastatic lesion 4.6 by 3.9 cm on image 63/2, previously 5.2 by 4.4 cm, mildly improved in size. However, there is a newly apparent 1.2 by 1.1 cm hypodense lesion in segment 6 of the liver on image 71/2 suspicious for metastatic lesion. Complex cystic lesion in the dome of the liver measuring 6.9 by 6.4 cm on image 56/2, previous more solid in appearance and previously measuring 4.5 by 4.3 cm. Overall the liver appearance is mixed but given the new lesion there is some concern for progression. Stable mild common bile duct dilatation at 1.1 cm in diameter. Pancreas: Unremarkable Spleen: Unremarkable Adrenals/Urinary Tract: Left  kidney lower pole simple appearing cyst. There are 2 nonobstructive left mid kidney calculi, the largest 0.3 cm in diameter on image 51/5. Adrenal glands normal. Stomach/Bowel: Right hemicolectomy. Vascular/Lymphatic: Aortoiliac atherosclerotic vascular disease. Infrarenal abdominal aortic ectasia at 2.8 cm with a small focal dissection in the left side of the infrarenal abdominal aorta as shown on image 79/5. No pathologic adenopathy is identified. Reproductive: Mild prostatomegaly. Other: No supplemental non-categorized findings. Musculoskeletal: Bridging spurring of the right sacroiliac joint. Lumbar spondylosis and degenerative disc disease. IMPRESSION: 1. Although one of the dominant solid lesions in the left hepatic lobe is reduced in size, there is a new right hepatic lobe metastatic lesion as well as enlargement and cystic degeneration of a dominant lesion in the dome of the liver. Overall the appearance favors progression despite the improvement in 1 of the lesions. 2. No current adenopathy. 3. 3 mm new subpleural nodule in the left lower lobe could be postinflammatory but merit surveillance. 4. Infrarenal abdominal aortic diameter 2.8 cm. Ectatic abdominal aorta at risk for aneurysm development. Recommend followup by ultrasound in 5 years. This recommendation follows ACR consensus guidelines: White Paper of the ACR Incidental Findings Committee II on Vascular Findings. J Am Coll Radiol 2013; 10:789-794. Aortic aneurysm NOS (ICD10-I71.9) 5. Infrarenal abdominal aortic focal dissection. 6. Other imaging findings of potential clinical significance: Aortic Atherosclerosis (ICD10-I70.0). Coronary atherosclerosis. Distal esophageal wall thickening, probably from esophagitis. Stable mildly dilated common bile duct, cause uncertain. Nonobstructive left nephrolithiasis. Aortic Atherosclerosis (ICD10-I70.0). Mild prostatomegaly. Lumbar spondylosis and degenerative disc disease. Electronically Signed   By: Van Clines M.D.   On: 10/10/2019 14:55   CT ABDOMEN PELVIS W CONTRAST  Result Date: 10/10/2019 CLINICAL DATA:  Colon cancer restaging. Prior right hemicolectomy. Chemotherapy for the last 2 weeks. EXAM: CT CHEST, ABDOMEN, AND PELVIS WITH CONTRAST TECHNIQUE: Multidetector CT imaging of the chest, abdomen and pelvis was performed following the standard protocol during bolus administration of intravenous contrast. CONTRAST:  112m OMNIPAQUE IOHEXOL 300 MG/ML  SOLN COMPARISON:  Multiple exams, including 03/06/2019 and CT chest from 03/06/2016 FINDINGS: CT CHEST FINDINGS Cardiovascular: Coronary, aortic arch, and branch vessel atherosclerotic vascular disease. Right Port-A-Cath tip: Lower SVC. Mediastinum/Nodes: No pathologic adenopathy in the chest. Mild distal esophageal wall thickening, though as common cause would be  esophagitis. Lungs/Pleura: Biapical pleuroparenchymal scarring. Small bands of mucus in the right and left mainstem bronchi. 3 mm subpleural nodule in the left lower lobe on image 113/4, not readily appreciable on prior exams. Musculoskeletal: Thoracic spondylosis. CT ABDOMEN PELVIS FINDINGS Hepatobiliary: Solid lateral segment left hepatic lobe metastatic lesion 4.6 by 3.9 cm on image 63/2, previously 5.2 by 4.4 cm, mildly improved in size. However, there is a newly apparent 1.2 by 1.1 cm hypodense lesion in segment 6 of the liver on image 71/2 suspicious for metastatic lesion. Complex cystic lesion in the dome of the liver measuring 6.9 by 6.4 cm on image 56/2, previous more solid in appearance and previously measuring 4.5 by 4.3 cm. Overall the liver appearance is mixed but given the new lesion there is some concern for progression. Stable mild common bile duct dilatation at 1.1 cm in diameter. Pancreas: Unremarkable Spleen: Unremarkable Adrenals/Urinary Tract: Left kidney lower pole simple appearing cyst. There are 2 nonobstructive left mid kidney calculi, the largest 0.3 cm in diameter on image  51/5. Adrenal glands normal. Stomach/Bowel: Right hemicolectomy. Vascular/Lymphatic: Aortoiliac atherosclerotic vascular disease. Infrarenal abdominal aortic ectasia at 2.8 cm with a small focal dissection in the left side of the infrarenal abdominal aorta as shown on image 79/5. No pathologic adenopathy is identified. Reproductive: Mild prostatomegaly. Other: No supplemental non-categorized findings. Musculoskeletal: Bridging spurring of the right sacroiliac joint. Lumbar spondylosis and degenerative disc disease. IMPRESSION: 1. Although one of the dominant solid lesions in the left hepatic lobe is reduced in size, there is a new right hepatic lobe metastatic lesion as well as enlargement and cystic degeneration of a dominant lesion in the dome of the liver. Overall the appearance favors progression despite the improvement in 1 of the lesions. 2. No current adenopathy. 3. 3 mm new subpleural nodule in the left lower lobe could be postinflammatory but merit surveillance. 4. Infrarenal abdominal aortic diameter 2.8 cm. Ectatic abdominal aorta at risk for aneurysm development. Recommend followup by ultrasound in 5 years. This recommendation follows ACR consensus guidelines: White Paper of the ACR Incidental Findings Committee II on Vascular Findings. J Am Coll Radiol 2013; 10:789-794. Aortic aneurysm NOS (ICD10-I71.9) 5. Infrarenal abdominal aortic focal dissection. 6. Other imaging findings of potential clinical significance: Aortic Atherosclerosis (ICD10-I70.0). Coronary atherosclerosis. Distal esophageal wall thickening, probably from esophagitis. Stable mildly dilated common bile duct, cause uncertain. Nonobstructive left nephrolithiasis. Aortic Atherosclerosis (ICD10-I70.0). Mild prostatomegaly. Lumbar spondylosis and degenerative disc disease. Electronically Signed   By: Van Clines M.D.   On: 10/10/2019 14:55    ASSESSMENT & PLAN:   Cancer of right colon ([place]) #Right-sided colon adenocarcinoma-with  synchronous metastasis to liver/unresectable.  Currently on FOLFOX plus Avastin; DEC 22nd- CT [compared to Sep 4th CT-[facility]]- overall stable; slight progression.  Reviewed the scan/implications at length.  As the patient is tolerating current chemotherapy fairly well I would recommend continued current therapy.  Will monitor closely.   #Proceed with today- cycle 7 of FOLFOX plus Avastin chemotherapy.  Recommend repeat imaging in 2 to 3 months based upon tumor markers.  If patient has continued progression of disease-treatment option would be FOLFIRI+ avastin.   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 STABLE.  Monitor for now.  # HTN- systolic 801K; STABLE. discussed re: avastin. Check pressures at hoome; bring a log.   # DISPOSITION: #FOLFOX+ Avastin today # follow up in 2 weeks- MD; labs- cbc/cmp/UA/ CEA; FOLFOX + avastin;pump off in 2 days- Dr.B  All questions were answered. The patient knows to call  the clinic with any problems, questions or concerns.    Cammie Sickle, MD 10/16/2019 1:14 PM

## 2019-10-16 NOTE — Assessment & Plan Note (Addendum)
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  Currently on FOLFOX plus Avastin; DEC 22nd- CT [compared to Sep 4th CT-[facility]]- overall stable; slight progression.  Reviewed the scan/implications at length.  As the patient is tolerating current chemotherapy fairly well I would recommend continued current therapy.  Will monitor closely.   #Proceed with today- cycle 7 of FOLFOX plus Avastin chemotherapy.  Recommend repeat imaging in 2 to 3 months based upon tumor markers.  If patient has continued progression of disease-treatment option would be FOLFIRI+ avastin.   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 STABLE.  Monitor for now.  # HTN- systolic [id number]; STABLE. discussed re: avastin. Check pressures at hoome; bring a log.   # DISPOSITION: #FOLFOX+ Avastin today # follow up in 2 weeks- MD; labs- cbc/cmp/UA/ CEA; FOLFOX + avastin;pump off in 2 days- Dr.B

## 2019-10-17 LAB — CEA: CEA: 14.8 ng/mL — ABNORMAL HIGH (ref 0.0–4.7)

## 2019-10-18 ENCOUNTER — Inpatient Hospital Stay: Payer: Medicare HMO

## 2019-10-18 ENCOUNTER — Other Ambulatory Visit: Payer: Self-pay

## 2019-10-18 VITALS — BP 157/82 | HR 79 | Resp 19

## 2019-10-18 DIAGNOSIS — Z7189 Other specified counseling: Secondary | ICD-10-CM

## 2019-10-18 DIAGNOSIS — C182 Malignant neoplasm of ascending colon: Secondary | ICD-10-CM | POA: Diagnosis not present

## 2019-10-18 DIAGNOSIS — Z5111 Encounter for antineoplastic chemotherapy: Secondary | ICD-10-CM | POA: Diagnosis not present

## 2019-10-18 DIAGNOSIS — Z5112 Encounter for antineoplastic immunotherapy: Secondary | ICD-10-CM | POA: Diagnosis not present

## 2019-10-18 DIAGNOSIS — Z452 Encounter for adjustment and management of vascular access device: Secondary | ICD-10-CM | POA: Diagnosis not present

## 2019-10-18 DIAGNOSIS — C787 Secondary malignant neoplasm of liver and intrahepatic bile duct: Secondary | ICD-10-CM | POA: Diagnosis not present

## 2019-10-18 MED ORDER — HEPARIN SOD (PORK) LOCK FLUSH 100 UNIT/ML IV SOLN
500.0000 [IU] | Freq: Once | INTRAVENOUS | Status: AC | PRN
  Administered 2019-10-18: 500 [IU]
  Filled 2019-10-18: qty 5

## 2019-10-18 MED ORDER — SODIUM CHLORIDE 0.9% FLUSH
10.0000 mL | INTRAVENOUS | Status: DC | PRN
  Administered 2019-10-18: 15:00:00 10 mL
  Filled 2019-10-18: qty 10

## 2019-10-18 MED ORDER — HEPARIN SOD (PORK) LOCK FLUSH 100 UNIT/ML IV SOLN
INTRAVENOUS | Status: AC
  Filled 2019-10-18: qty 5

## 2019-10-24 DIAGNOSIS — C182 Malignant neoplasm of ascending colon: Secondary | ICD-10-CM | POA: Diagnosis not present

## 2019-10-30 ENCOUNTER — Other Ambulatory Visit: Payer: Self-pay

## 2019-10-30 ENCOUNTER — Inpatient Hospital Stay: Payer: Medicare HMO | Attending: Internal Medicine

## 2019-10-30 ENCOUNTER — Inpatient Hospital Stay: Payer: Medicare HMO

## 2019-10-30 ENCOUNTER — Inpatient Hospital Stay (HOSPITAL_BASED_OUTPATIENT_CLINIC_OR_DEPARTMENT_OTHER): Payer: Medicare HMO | Admitting: Internal Medicine

## 2019-10-30 DIAGNOSIS — Z5111 Encounter for antineoplastic chemotherapy: Secondary | ICD-10-CM | POA: Insufficient documentation

## 2019-10-30 DIAGNOSIS — C787 Secondary malignant neoplasm of liver and intrahepatic bile duct: Secondary | ICD-10-CM | POA: Diagnosis not present

## 2019-10-30 DIAGNOSIS — C182 Malignant neoplasm of ascending colon: Secondary | ICD-10-CM | POA: Diagnosis not present

## 2019-10-30 DIAGNOSIS — Z515 Encounter for palliative care: Secondary | ICD-10-CM | POA: Diagnosis not present

## 2019-10-30 DIAGNOSIS — Z95828 Presence of other vascular implants and grafts: Secondary | ICD-10-CM

## 2019-10-30 DIAGNOSIS — Z7189 Other specified counseling: Secondary | ICD-10-CM

## 2019-10-30 LAB — COMPREHENSIVE METABOLIC PANEL
ALT: 17 U/L (ref 0–44)
AST: 23 U/L (ref 15–41)
Albumin: 3.4 g/dL — ABNORMAL LOW (ref 3.5–5.0)
Alkaline Phosphatase: 97 U/L (ref 38–126)
Anion gap: 6 (ref 5–15)
BUN: 12 mg/dL (ref 8–23)
CO2: 24 mmol/L (ref 22–32)
Calcium: 8.7 mg/dL — ABNORMAL LOW (ref 8.9–10.3)
Chloride: 109 mmol/L (ref 98–111)
Creatinine, Ser: 0.67 mg/dL (ref 0.61–1.24)
GFR calc Af Amer: >60 mL/min (ref >60)
GFR calc non Af Amer: >60 mL/min (ref >60)
Glucose, Bld: 114 mg/dL — ABNORMAL HIGH (ref 70–99)
Potassium: 4 mmol/L (ref 3.5–5.1)
Sodium: 139 mmol/L (ref 135–145)
Total Bilirubin: 0.5 mg/dL (ref 0.3–1.2)
Total Protein: 6.6 g/dL (ref 6.5–8.1)

## 2019-10-30 LAB — CBC WITH DIFFERENTIAL/PLATELET
Abs Immature Granulocytes: 0.01 10*3/uL (ref 0.00–0.07)
Basophils Absolute: 0.1 10*3/uL (ref 0.0–0.1)
Basophils Relative: 1 %
Eosinophils Absolute: 0.4 10*3/uL (ref 0.0–0.5)
Eosinophils Relative: 9 %
HCT: 38.4 % — ABNORMAL LOW (ref 39.0–52.0)
Hemoglobin: 12.4 g/dL — ABNORMAL LOW (ref 13.0–17.0)
Immature Granulocytes: 0 %
Lymphocytes Relative: 30 %
Lymphs Abs: 1.5 10*3/uL (ref 0.7–4.0)
MCH: 32 pg (ref 26.0–34.0)
MCHC: 32.3 g/dL (ref 30.0–36.0)
MCV: 99 fL (ref 80.0–100.0)
Monocytes Absolute: 0.7 10*3/uL (ref 0.1–1.0)
Monocytes Relative: 15 %
Neutro Abs: 2.3 10*3/uL (ref 1.7–7.7)
Neutrophils Relative %: 45 %
Platelets: 132 10*3/uL — ABNORMAL LOW (ref 150–400)
RBC: 3.88 MIL/uL — ABNORMAL LOW (ref 4.22–5.81)
RDW: 14.6 % (ref 11.5–15.5)
WBC: 5 10*3/uL (ref 4.0–10.5)
nRBC: 0 % (ref 0.0–0.2)

## 2019-10-30 LAB — URINALYSIS, COMPLETE (UACMP) WITH MICROSCOPIC
Bacteria, UA: NONE SEEN
Bilirubin Urine: NEGATIVE
Glucose, UA: NEGATIVE mg/dL
Hgb urine dipstick: NEGATIVE
Ketones, ur: 5 mg/dL — AB
Leukocytes,Ua: NEGATIVE
Nitrite: NEGATIVE
Protein, ur: 30 mg/dL — AB
Specific Gravity, Urine: 1.032 — ABNORMAL HIGH (ref 1.005–1.030)
pH: 5 (ref 5.0–8.0)

## 2019-10-30 MED ORDER — SODIUM CHLORIDE 0.9 % IV SOLN
2427.0000 mg/m2 | INTRAVENOUS | Status: DC
  Administered 2019-10-30: 5000 mg via INTRAVENOUS
  Filled 2019-10-30: qty 100

## 2019-10-30 MED ORDER — DEXTROSE 5 % IV SOLN
Freq: Once | INTRAVENOUS | Status: AC
  Filled 2019-10-30: qty 250

## 2019-10-30 MED ORDER — OXALIPLATIN CHEMO INJECTION 100 MG/20ML
85.0000 mg/m2 | Freq: Once | INTRAVENOUS | Status: AC
  Administered 2019-10-30: 11:00:00 180 mg via INTRAVENOUS
  Filled 2019-10-30: qty 36

## 2019-10-30 MED ORDER — PALONOSETRON HCL INJECTION 0.25 MG/5ML
0.2500 mg | Freq: Once | INTRAVENOUS | Status: AC
  Administered 2019-10-30: 10:00:00 0.25 mg via INTRAVENOUS
  Filled 2019-10-30: qty 5

## 2019-10-30 MED ORDER — SODIUM CHLORIDE 0.9% FLUSH
10.0000 mL | Freq: Once | INTRAVENOUS | Status: AC
  Administered 2019-10-30: 10 mL via INTRAVENOUS
  Filled 2019-10-30: qty 10

## 2019-10-30 MED ORDER — SODIUM CHLORIDE 0.9 % IV SOLN
5.0000 mg/kg | Freq: Once | INTRAVENOUS | Status: AC
  Administered 2019-10-30: 11:00:00 400 mg via INTRAVENOUS
  Filled 2019-10-30: qty 16

## 2019-10-30 MED ORDER — SODIUM CHLORIDE 0.9 % IV SOLN: 2400.0000 mg/m2 | INTRAVENOUS | Status: DC

## 2019-10-30 MED ORDER — LEUCOVORIN CALCIUM INJECTION 350 MG
379.0000 mg/m2 | Freq: Once | INTRAVENOUS | Status: AC
  Administered 2019-10-30: 800 mg via INTRAVENOUS
  Filled 2019-10-30: qty 10

## 2019-10-30 MED ORDER — SODIUM CHLORIDE 0.9 % IV SOLN
INTRAVENOUS | Status: DC
  Filled 2019-10-30: qty 250

## 2019-10-30 MED ORDER — DEXAMETHASONE SODIUM PHOSPHATE 10 MG/ML IJ SOLN
10.0000 mg | Freq: Once | INTRAMUSCULAR | Status: AC
  Administered 2019-10-30: 10:00:00 10 mg via INTRAVENOUS
  Filled 2019-10-30: qty 1

## 2019-10-30 NOTE — Assessment & Plan Note (Addendum)
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  Currently on FOLFOX plus Avastin; DEC 22nd- CT [compared to Sep 4th CT-[facility]]- overall stable; slight progression. STABLE. CEA- improving.   #Proceed with today- cycle 8 of FOLFOX plus Avastin chemotherapy.  Labs today reviewed;  acceptable for treatment today.   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 STABLE.  Monitor for now.  # HTN- systolic 123456; STABLE. At home 140s- discussed re: avastin. Check pressures at home; bring a log.   # DISPOSITION: #FOLFOX+ Avastin today # follow up in 2 weeks- MD; labs- cbc/cmp/UA/ CEA; FOLFOX + avastin;pump off in 2 days- Dr.B

## 2019-10-30 NOTE — Progress Notes (Signed)
[facility] CONSULT NOTE  Patient Care Team: Sofie Hartigan, MD as PCP - General (Family Medicine)  CHIEF COMPLAINTS/PURPOSE OF CONSULTATION: Colon cancer  #  Oncology History Overview Note  # MAY 2020- 3. 03/09/19 Liver biopsy. Microscopic examination shows malignant cells with glandular architecture consistent with adenocarcinoma. The malignant cells are positive for CK20 and CDX-2. These findings support the clinical impression of metastatic colon adenocarcinoma. 4. 03/10/19 R hemicolectomy. Tumor site cecum. Adenocarcinoma. Mucinous features present. G2. No tumor deposits. Invades visceral peritoneum. No tumor perforation. LVI present. PNI not identified. All margins uninvolved. 1/12 LNs. PT4apN1. Periappendiceal inflammation c/w resolving abscess. Microsatellite stable (MSS). [Dr.Mettu; [facility]]  # SEP 4th 2020 [compared to May 2020]  Interval increase in size of the metastases to the hepatic dome, The metastasis to the left hepatic lobe is unchanged; 2.  New subcentimeter hypoattenuating lesion in the inferior right hepatic lobe, incompletely characterized on CT. 3.  Postsurgical changes following right hemicolectomy.  # OCT 2020- FOLFOX +avastin; CT dec 22nd 2020- [compared to [facility] sep 9th 2020]-Liver- slight progression versus stable disease.  # NGS/F-ONE-MUTATED K-RAS [G]  # PALLIATIVE CARE EVALUATION: 09/20/2019-[name]  # PAIN MANAGEMENT: NA   DIAGNOSIS: COLON CANCER  STAGE:  IV     ;  GOALS:Palliative  CURRENT/MOST RECENT THERAPY : FOLFOX+ avastin [C]    Cancer of right colon ([place])  07/05/2019 Initial Diagnosis   Cancer of right colon ([place])   07/24/2019 -  Chemotherapy   The patient had palonosetron (ALOXI) injection 0.25 mg, 0.25 mg, Intravenous,  Once, 7 of 10 cycles Administration: 0.25 mg (07/24/2019), 0.25 mg (08/07/2019), 0.25 mg (08/21/2019), 0.25 mg (09/04/2019), 0.25 mg (09/20/2019), 0.25 mg (10/04/2019), 0.25 mg (10/16/2019) leucovorin 800 mg in  dextrose 5 % 250 mL infusion, 844 mg, Intravenous,  Once, 7 of 10 cycles Administration: 800 mg (07/24/2019), 800 mg (08/07/2019), 800 mg (08/21/2019), 800 mg (09/04/2019), 800 mg (09/20/2019), 800 mg (10/04/2019), 800 mg (10/16/2019) oxaliplatin (ELOXATIN) 180 mg in dextrose 5 % 500 mL chemo infusion, 85 mg/m2 = 180 mg, Intravenous,  Once, 7 of 10 cycles Administration: 180 mg (07/24/2019), 180 mg (08/07/2019), 180 mg (08/21/2019), 180 mg (09/04/2019), 180 mg (09/20/2019), 180 mg (10/04/2019), 180 mg (10/16/2019) fluorouracil (ADRUCIL) 5,000 mg in sodium chloride 0.9 % 150 mL chemo infusion, 5,050 mg, Intravenous, 1 Day/Dose, 7 of 10 cycles Administration: 5,000 mg (07/24/2019), 5,000 mg (08/07/2019), 5,000 mg (08/21/2019), 5,050 mg (09/04/2019), 5,000 mg (10/04/2019), 5,000 mg (10/16/2019) bevacizumab-bvzr (ZIRABEV) 400 mg in sodium chloride 0.9 % 100 mL chemo infusion, 5 mg/kg = 400 mg, Intravenous,  Once, 7 of 10 cycles Administration: 400 mg (08/07/2019), 400 mg (08/21/2019), 400 mg (09/04/2019), 400 mg (09/20/2019), 400 mg (10/04/2019), 400 mg (10/16/2019)  for chemotherapy treatment.      HISTORY OF PRESENTING ILLNESS:  Oscar [name] 79 y.o.  male with metastatic colon cancer to the liver currently on FOLFOX plus Avastin is here for follow-up.  Patient appetite is good.  No weight loss but no nausea no vomiting.  No swelling in the legs.  Mild tingling numbness in the extremities.  No headache.  No nosebleeds.   Review of Systems  Constitutional: Positive for malaise/fatigue. Negative for chills, diaphoresis, fever and weight loss.  HENT: Negative for nosebleeds and sore throat.   Eyes: Negative for double vision.  Respiratory: Negative for cough, hemoptysis, sputum production, shortness of breath and wheezing.   Cardiovascular: Negative for chest pain, palpitations, orthopnea and leg swelling.  Gastrointestinal: Negative for abdominal  pain, blood in stool, constipation, diarrhea, heartburn,  melena, nausea and vomiting.  Genitourinary: Negative for dysuria, frequency and urgency.  Musculoskeletal: Positive for back pain and joint pain.  Skin: Negative.  Negative for itching and rash.  Neurological: Positive for tingling. Negative for dizziness, focal weakness, weakness and headaches.  Endo/Heme/Allergies: Does not bruise/bleed easily.  Psychiatric/Behavioral: Negative for depression. The patient is not nervous/anxious and does not have insomnia.      MEDICAL HISTORY:  Past Medical History:  Diagnosis Date  . Arthritis   . Cancer ([place])    colon cancer 02/2019 per pt   . Diabetes mellitus without complication ([place])   . H/O colon cancer, stage IV   . Hyperlipemia   . Hypertension     SURGICAL HISTORY: Past Surgical History:  Procedure Laterality Date  . IR IMAGING GUIDED PORT INSERTION  07/20/2019  . JOINT REPLACEMENT      SOCIAL HISTORY: Social History   Socioeconomic History  . Marital status: Married    Spouse name: Not on file  . Number of children: Not on file  . Years of education: Not on file  . Highest education level: Not on file  Occupational History  . Not on file  Tobacco Use  . Smoking status: Current Every Day Smoker    Packs/day: 0.25    Types: Cigarettes  . Smokeless tobacco: Never Used  . Tobacco comment: 1 to 2 cigarettes a day occasionally  Substance and Sexual Activity  . Alcohol use: No  . Drug use: No  . Sexual activity: Not on file  Other Topics Concern  . Not on file  Social History Narrative   Recruitment consultant retd; lives in [place]; smoking 3cig/day; [3/4 ppd x started at 9 years]; no alcohol. Son & daughter; wife dementia [waiting for placement].    Social Determinants of Health   Financial Resource Strain:   . Difficulty of Paying Living Expenses: Not on file  Food Insecurity:   . Worried About Charity fundraiser in the Last Year: Not on file  . Ran Out of Food in the Last Year: Not on file  Transportation  Needs:   . Lack of Transportation (Medical): Not on file  . Lack of Transportation (Non-Medical): Not on file  Physical Activity:   . Days of Exercise per Week: Not on file  . Minutes of Exercise per Session: Not on file  Stress:   . Feeling of Stress : Not on file  Social Connections:   . Frequency of Communication with Friends and Family: Not on file  . Frequency of Social Gatherings with Friends and Family: Not on file  . Attends Religious Services: Not on file  . Active Member of Clubs or Organizations: Not on file  . Attends Archivist Meetings: Not on file  . Marital Status: Not on file  Intimate Partner Violence:   . Fear of Current or Ex-Partner: Not on file  . Emotionally Abused: Not on file  . Physically Abused: Not on file  . Sexually Abused: Not on file    FAMILY HISTORY: Family History  Problem Relation Age of Onset  . Peptic Ulcer Disease Father     ALLERGIES:  is allergic to ace inhibitors.  MEDICATIONS:  Current Outpatient Medications  Medication Sig Dispense Refill  . amLODipine (NORVASC) 5 MG tablet Take 5 mg by mouth daily.    . fluticasone (FLONASE) 50 MCG/ACT nasal spray Place 1 spray into both nostrils daily as needed.     Marland Kitchen  ondansetron (ZOFRAN-ODT) 4 MG disintegrating tablet Take 1 tablet (4 mg total) by mouth every 6 (six) hours as needed for nausea. 20 tablet 0  . pravastatin (PRAVACHOL) 40 MG tablet Take 40 mg by mouth daily.    . prochlorperazine (COMPAZINE) 10 MG tablet Take 1 tablet (10 mg total) by mouth every 6 (six) hours as needed for nausea or vomiting. 40 tablet 1  . tamsulosin (FLOMAX) 0.4 MG CAPS capsule Take 1 capsule by mouth daily.    Marland Kitchen lidocaine-prilocaine (EMLA) cream Apply 1 application topically as needed. (Patient not taking: Reported on 09/20/2019) 30 g 0   No current facility-administered medications for this visit.      Marland Kitchen  PHYSICAL EXAMINATION: ECOG PERFORMANCE STATUS: 0 - Asymptomatic  Vitals:   10/30/19 0852   BP: (!) 165/78  Pulse: 76  Temp: (!) 97.1 F (36.2 C)   Filed Weights   10/30/19 0852  Weight: 185 lb (83.9 kg)    Physical Exam  Constitutional: He is oriented to person, place, and time and well-developed, well-nourished, and in no distress.  HENT:  Head: Normocephalic and atraumatic.  Mouth/Throat: Oropharynx is clear and moist. No oropharyngeal exudate.  Eyes: Pupils are equal, round, and reactive to light.  Cardiovascular: Normal rate and regular rhythm.  Pulmonary/Chest: No respiratory distress. He has no wheezes.  Abdominal: Soft. Bowel sounds are normal. He exhibits no distension and no mass. There is no abdominal tenderness. There is no rebound and no guarding.  Musculoskeletal:        General: No tenderness or edema. Normal range of motion.     Cervical back: Normal range of motion and neck supple.  Neurological: He is alert and oriented to person, place, and time.  Skin: Skin is warm.  Psychiatric: Affect normal.   LABORATORY DATA:  I have reviewed the data as listed Lab Results  Component Value Date   WBC 5.0 10/30/2019   HGB 12.4 (L) 10/30/2019   HCT 38.4 (L) 10/30/2019   MCV 99.0 10/30/2019   PLT 132 (L) 10/30/2019   Recent Labs    10/04/19 0818 10/16/19 0832 10/30/19 0834  NA 140 141 139  K 3.7 3.5 4.0  CL 109 110 109  CO2 '24 25 24  '$ GLUCOSE 156* 133* 114*  BUN '12 11 12  '$ CREATININE 0.75 0.67 0.67  CALCIUM 8.8* 8.6* 8.7*  GFRNONAA >60 >60 >60  GFRAA >60 >60 >60  PROT 6.5 6.6 6.6  ALBUMIN 3.5 3.4* 3.4*  AST '22 22 23  '$ ALT '15 13 17  '$ ALKPHOS 90 94 97  BILITOT 0.7 0.6 0.5    RADIOGRAPHIC STUDIES: I have personally reviewed the radiological images as listed and agreed with the findings in the report. CT Chest W Contrast  Result Date: 10/10/2019 CLINICAL DATA:  Colon cancer restaging. Prior right hemicolectomy. Chemotherapy for the last 2 weeks. EXAM: CT CHEST, ABDOMEN, AND PELVIS WITH CONTRAST TECHNIQUE: Multidetector CT imaging of the chest,  abdomen and pelvis was performed following the standard protocol during bolus administration of intravenous contrast. CONTRAST:  120m OMNIPAQUE IOHEXOL 300 MG/ML  SOLN COMPARISON:  Multiple exams, including 03/06/2019 and CT chest from 03/06/2016 FINDINGS: CT CHEST FINDINGS Cardiovascular: Coronary, aortic arch, and branch vessel atherosclerotic vascular disease. Right Port-A-Cath tip: Lower SVC. Mediastinum/Nodes: No pathologic adenopathy in the chest. Mild distal esophageal wall thickening, though as common cause would be esophagitis. Lungs/Pleura: Biapical pleuroparenchymal scarring. Small bands of mucus in the right and left mainstem bronchi. 3 mm subpleural nodule in the  left lower lobe on image 113/4, not readily appreciable on prior exams. Musculoskeletal: Thoracic spondylosis. CT ABDOMEN PELVIS FINDINGS Hepatobiliary: Solid lateral segment left hepatic lobe metastatic lesion 4.6 by 3.9 cm on image 63/2, previously 5.2 by 4.4 cm, mildly improved in size. However, there is a newly apparent 1.2 by 1.1 cm hypodense lesion in segment 6 of the liver on image 71/2 suspicious for metastatic lesion. Complex cystic lesion in the dome of the liver measuring 6.9 by 6.4 cm on image 56/2, previous more solid in appearance and previously measuring 4.5 by 4.3 cm. Overall the liver appearance is mixed but given the new lesion there is some concern for progression. Stable mild common bile duct dilatation at 1.1 cm in diameter. Pancreas: Unremarkable Spleen: Unremarkable Adrenals/Urinary Tract: Left kidney lower pole simple appearing cyst. There are 2 nonobstructive left mid kidney calculi, the largest 0.3 cm in diameter on image 51/5. Adrenal glands normal. Stomach/Bowel: Right hemicolectomy. Vascular/Lymphatic: Aortoiliac atherosclerotic vascular disease. Infrarenal abdominal aortic ectasia at 2.8 cm with a small focal dissection in the left side of the infrarenal abdominal aorta as shown on image 79/5. No pathologic  adenopathy is identified. Reproductive: Mild prostatomegaly. Other: No supplemental non-categorized findings. Musculoskeletal: Bridging spurring of the right sacroiliac joint. Lumbar spondylosis and degenerative disc disease. IMPRESSION: 1. Although one of the dominant solid lesions in the left hepatic lobe is reduced in size, there is a new right hepatic lobe metastatic lesion as well as enlargement and cystic degeneration of a dominant lesion in the dome of the liver. Overall the appearance favors progression despite the improvement in 1 of the lesions. 2. No current adenopathy. 3. 3 mm new subpleural nodule in the left lower lobe could be postinflammatory but merit surveillance. 4. Infrarenal abdominal aortic diameter 2.8 cm. Ectatic abdominal aorta at risk for aneurysm development. Recommend followup by ultrasound in 5 years. This recommendation follows ACR consensus guidelines: White Paper of the ACR Incidental Findings Committee II on Vascular Findings. J Am Coll Radiol 2013; 10:789-794. Aortic aneurysm NOS (ICD10-I71.9) 5. Infrarenal abdominal aortic focal dissection. 6. Other imaging findings of potential clinical significance: Aortic Atherosclerosis (ICD10-I70.0). Coronary atherosclerosis. Distal esophageal wall thickening, probably from esophagitis. Stable mildly dilated common bile duct, cause uncertain. Nonobstructive left nephrolithiasis. Aortic Atherosclerosis (ICD10-I70.0). Mild prostatomegaly. Lumbar spondylosis and degenerative disc disease. Electronically Signed   By: Van Clines M.D.   On: 10/10/2019 14:55   CT ABDOMEN PELVIS W CONTRAST  Result Date: 10/10/2019 CLINICAL DATA:  Colon cancer restaging. Prior right hemicolectomy. Chemotherapy for the last 2 weeks. EXAM: CT CHEST, ABDOMEN, AND PELVIS WITH CONTRAST TECHNIQUE: Multidetector CT imaging of the chest, abdomen and pelvis was performed following the standard protocol during bolus administration of intravenous contrast. CONTRAST:   134m OMNIPAQUE IOHEXOL 300 MG/ML  SOLN COMPARISON:  Multiple exams, including 03/06/2019 and CT chest from 03/06/2016 FINDINGS: CT CHEST FINDINGS Cardiovascular: Coronary, aortic arch, and branch vessel atherosclerotic vascular disease. Right Port-A-Cath tip: Lower SVC. Mediastinum/Nodes: No pathologic adenopathy in the chest. Mild distal esophageal wall thickening, though as common cause would be esophagitis. Lungs/Pleura: Biapical pleuroparenchymal scarring. Small bands of mucus in the right and left mainstem bronchi. 3 mm subpleural nodule in the left lower lobe on image 113/4, not readily appreciable on prior exams. Musculoskeletal: Thoracic spondylosis. CT ABDOMEN PELVIS FINDINGS Hepatobiliary: Solid lateral segment left hepatic lobe metastatic lesion 4.6 by 3.9 cm on image 63/2, previously 5.2 by 4.4 cm, mildly improved in size. However, there is a newly apparent 1.2 by 1.1  cm hypodense lesion in segment 6 of the liver on image 71/2 suspicious for metastatic lesion. Complex cystic lesion in the dome of the liver measuring 6.9 by 6.4 cm on image 56/2, previous more solid in appearance and previously measuring 4.5 by 4.3 cm. Overall the liver appearance is mixed but given the new lesion there is some concern for progression. Stable mild common bile duct dilatation at 1.1 cm in diameter. Pancreas: Unremarkable Spleen: Unremarkable Adrenals/Urinary Tract: Left kidney lower pole simple appearing cyst. There are 2 nonobstructive left mid kidney calculi, the largest 0.3 cm in diameter on image 51/5. Adrenal glands normal. Stomach/Bowel: Right hemicolectomy. Vascular/Lymphatic: Aortoiliac atherosclerotic vascular disease. Infrarenal abdominal aortic ectasia at 2.8 cm with a small focal dissection in the left side of the infrarenal abdominal aorta as shown on image 79/5. No pathologic adenopathy is identified. Reproductive: Mild prostatomegaly. Other: No supplemental non-categorized findings. Musculoskeletal: Bridging  spurring of the right sacroiliac joint. Lumbar spondylosis and degenerative disc disease. IMPRESSION: 1. Although one of the dominant solid lesions in the left hepatic lobe is reduced in size, there is a new right hepatic lobe metastatic lesion as well as enlargement and cystic degeneration of a dominant lesion in the dome of the liver. Overall the appearance favors progression despite the improvement in 1 of the lesions. 2. No current adenopathy. 3. 3 mm new subpleural nodule in the left lower lobe could be postinflammatory but merit surveillance. 4. Infrarenal abdominal aortic diameter 2.8 cm. Ectatic abdominal aorta at risk for aneurysm development. Recommend followup by ultrasound in 5 years. This recommendation follows ACR consensus guidelines: White Paper of the ACR Incidental Findings Committee II on Vascular Findings. J Am Coll Radiol 2013; 10:789-794. Aortic aneurysm NOS (ICD10-I71.9) 5. Infrarenal abdominal aortic focal dissection. 6. Other imaging findings of potential clinical significance: Aortic Atherosclerosis (ICD10-I70.0). Coronary atherosclerosis. Distal esophageal wall thickening, probably from esophagitis. Stable mildly dilated common bile duct, cause uncertain. Nonobstructive left nephrolithiasis. Aortic Atherosclerosis (ICD10-I70.0). Mild prostatomegaly. Lumbar spondylosis and degenerative disc disease. Electronically Signed   By: Van Clines M.D.   On: 10/10/2019 14:55    ASSESSMENT & PLAN:   Cancer of right colon ([place]) #Right-sided colon adenocarcinoma-with synchronous metastasis to liver/unresectable.  Currently on FOLFOX plus Avastin; DEC 22nd- CT [compared to Sep 4th CT-[facility]]- overall stable; slight progression. STABLE. CEA- improving.   #Proceed with today- cycle 8 of FOLFOX plus Avastin chemotherapy.  Labs today reviewed;  acceptable for treatment today.   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 STABLE.  Monitor for now.  # HTN- systolic 188Q; STABLE. At home 140s- discussed re:  avastin. Check pressures at home; bring a log.   # DISPOSITION: #FOLFOX+ Avastin today # follow up in 2 weeks- MD; labs- cbc/cmp/UA/ CEA; FOLFOX + avastin;pump off in 2 days- Dr.B  All questions were answered. The patient knows to call the clinic with any problems, questions or concerns.    Cammie Sickle, MD 10/30/2019 9:21 AM

## 2019-10-31 LAB — CEA: CEA: 16.4 ng/mL — ABNORMAL HIGH (ref 0.0–4.7)

## 2019-11-01 ENCOUNTER — Inpatient Hospital Stay: Payer: Medicare HMO

## 2019-11-01 ENCOUNTER — Other Ambulatory Visit: Payer: Self-pay

## 2019-11-01 VITALS — BP 165/95 | HR 80 | Resp 20

## 2019-11-01 DIAGNOSIS — C182 Malignant neoplasm of ascending colon: Secondary | ICD-10-CM | POA: Diagnosis not present

## 2019-11-01 DIAGNOSIS — Z5111 Encounter for antineoplastic chemotherapy: Secondary | ICD-10-CM | POA: Diagnosis not present

## 2019-11-01 DIAGNOSIS — Z7189 Other specified counseling: Secondary | ICD-10-CM

## 2019-11-01 DIAGNOSIS — C787 Secondary malignant neoplasm of liver and intrahepatic bile duct: Secondary | ICD-10-CM | POA: Diagnosis not present

## 2019-11-01 DIAGNOSIS — Z515 Encounter for palliative care: Secondary | ICD-10-CM | POA: Diagnosis not present

## 2019-11-01 MED ORDER — HEPARIN SOD (PORK) LOCK FLUSH 100 UNIT/ML IV SOLN
500.0000 [IU] | Freq: Once | INTRAVENOUS | Status: AC | PRN
  Administered 2019-11-01: 500 [IU]
  Filled 2019-11-01: qty 5

## 2019-11-02 ENCOUNTER — Inpatient Hospital Stay (HOSPITAL_BASED_OUTPATIENT_CLINIC_OR_DEPARTMENT_OTHER): Payer: Medicare HMO | Admitting: Hospice and Palliative Medicine

## 2019-11-02 ENCOUNTER — Encounter: Payer: Self-pay | Admitting: Hospice and Palliative Medicine

## 2019-11-02 ENCOUNTER — Other Ambulatory Visit: Payer: Self-pay

## 2019-11-02 VITALS — BP 146/89 | HR 83 | Temp 97.3°F | Resp 18 | Wt 186.0 lb

## 2019-11-02 DIAGNOSIS — Z7189 Other specified counseling: Secondary | ICD-10-CM | POA: Diagnosis not present

## 2019-11-02 DIAGNOSIS — Z5111 Encounter for antineoplastic chemotherapy: Secondary | ICD-10-CM | POA: Diagnosis not present

## 2019-11-02 DIAGNOSIS — Z515 Encounter for palliative care: Secondary | ICD-10-CM | POA: Diagnosis not present

## 2019-11-02 DIAGNOSIS — C182 Malignant neoplasm of ascending colon: Secondary | ICD-10-CM | POA: Diagnosis not present

## 2019-11-02 DIAGNOSIS — C787 Secondary malignant neoplasm of liver and intrahepatic bile duct: Secondary | ICD-10-CM | POA: Diagnosis not present

## 2019-11-02 NOTE — Progress Notes (Signed)
[place]  [phone] [phone]   Name: Oscar [name] Date: 11/02/2019 MRN: [id number]  DOB: Mar 10, 1941  Patient Care Team: Sofie Hartigan, MD as PCP - General (Family Medicine)    REASON FOR CONSULTATION: Palliative Care consult requested for this 79 y.o. male with multiple medical problems including stage IV colon cancer on treatment with FOLFOX.  Patient was referred to palliative care to help address goals and manage ongoing symptoms.  SOCIAL HISTORY:     reports that he has been smoking cigarettes. He has been smoking about 0.25 packs per day. He has never used smokeless tobacco. He reports that he does not drink alcohol or use drugs.   Patient is married.  He and his wife live with his stepdaughter.  His wife has advanced dementia and is followed at home by hospice.  Patient has a son and daughter and two twin stepdaughters.  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  ADVANCE DIRECTIVES:  Does not have  CODE STATUS: DNR/DNI (MOST form completed on 11/02/2019)  PAST MEDICAL HISTORY: Past Medical History:  Diagnosis Date  . Arthritis   . Cancer ([place])    colon cancer 02/2019 per pt   . Diabetes mellitus without complication ([place])   . H/O colon cancer, stage IV   . Hyperlipemia   . Hypertension     PAST SURGICAL HISTORY:  Past Surgical History:  Procedure Laterality Date  . IR IMAGING GUIDED PORT INSERTION  07/20/2019  . JOINT REPLACEMENT      HEMATOLOGY/ONCOLOGY HISTORY:  Oncology History Overview Note  # MAY 2020- 3. 03/09/19 Liver biopsy. Microscopic examination shows malignant cells with glandular architecture consistent with adenocarcinoma. The malignant cells are positive for CK20 and CDX-2. These findings support the clinical impression of metastatic colon adenocarcinoma. 4. 03/10/19 R hemicolectomy. Tumor site cecum. Adenocarcinoma. Mucinous features present. G2. No tumor  deposits. Invades visceral peritoneum. No tumor perforation. LVI present. PNI not identified. All margins uninvolved. 1/12 LNs. PT4apN1. Periappendiceal inflammation c/w resolving abscess. Microsatellite stable (MSS). [Dr.Mettu; [facility]]  # SEP 4th 2020 [compared to May 2020]  Interval increase in size of the metastases to the hepatic dome, The metastasis to the left hepatic lobe is unchanged; 2.  New subcentimeter hypoattenuating lesion in the inferior right hepatic lobe, incompletely characterized on CT. 3.  Postsurgical changes following right hemicolectomy.  # OCT 2020- FOLFOX +avastin; CT dec 22nd 2020- [compared to [facility] sep 9th 2020]-Liver- slight progression versus stable disease.  # NGS/F-ONE-MUTATED K-RAS [G]  # PALLIATIVE CARE EVALUATION: 09/20/2019-[name]  # PAIN MANAGEMENT: NA   DIAGNOSIS: COLON CANCER  STAGE:  IV     ;  GOALS:Palliative  CURRENT/MOST RECENT THERAPY : FOLFOX+ avastin [C]    Cancer of right colon ([place])  07/05/2019 Initial Diagnosis   Cancer of right colon ([place])   07/24/2019 -  Chemotherapy   The patient had palonosetron (ALOXI) injection 0.25 mg, 0.25 mg, Intravenous,  Once, 8 of 10 cycles Administration: 0.25 mg (07/24/2019), 0.25 mg (08/07/2019), 0.25 mg (08/21/2019), 0.25 mg (09/04/2019), 0.25 mg (09/20/2019), 0.25 mg (10/04/2019), 0.25 mg (10/16/2019), 0.25 mg (10/30/2019) leucovorin 800 mg in dextrose 5 % 250 mL infusion, 844 mg, Intravenous,  Once, 8 of 10 cycles Administration: 800 mg (07/24/2019), 800 mg (08/07/2019), 800 mg (08/21/2019), 800 mg (09/04/2019), 800 mg (09/20/2019), 800 mg (10/04/2019), 800 mg (10/16/2019), 800 mg (10/30/2019) oxaliplatin (ELOXATIN) 180 mg in dextrose 5 % 500 mL chemo infusion, 85 mg/m2 =  180 mg, Intravenous,  Once, 8 of 10 cycles Administration: 180 mg (07/24/2019), 180 mg (08/07/2019), 180 mg (08/21/2019), 180 mg (09/04/2019), 180 mg (09/20/2019), 180 mg (10/04/2019), 180 mg (10/16/2019), 180 mg (10/30/2019) fluorouracil (ADRUCIL) 5,000  mg in sodium chloride 0.9 % 150 mL chemo infusion, 5,050 mg, Intravenous, 1 Day/Dose, 8 of 10 cycles Administration: 5,000 mg (07/24/2019), 5,000 mg (08/07/2019), 5,000 mg (08/21/2019), 5,050 mg (09/04/2019), 5,000 mg (10/04/2019), 5,000 mg (10/16/2019) bevacizumab-bvzr (ZIRABEV) 400 mg in sodium chloride 0.9 % 100 mL chemo infusion, 5 mg/kg = 400 mg, Intravenous,  Once, 8 of 10 cycles Administration: 400 mg (08/07/2019), 400 mg (08/21/2019), 400 mg (09/04/2019), 400 mg (09/20/2019), 400 mg (10/04/2019), 400 mg (10/16/2019), 400 mg (10/30/2019)  for chemotherapy treatment.      ALLERGIES:  is allergic to ace inhibitors.  MEDICATIONS:  Current Outpatient Medications  Medication Sig Dispense Refill  . amLODipine (NORVASC) 5 MG tablet Take 5 mg by mouth daily.    . fluticasone (FLONASE) 50 MCG/ACT nasal spray Place 1 spray into both nostrils daily as needed.     . lidocaine-prilocaine (EMLA) cream Apply 1 application topically as needed. 30 g 0  . meloxicam (MOBIC) 15 MG tablet Take 15 mg by mouth daily.    . ondansetron (ZOFRAN-ODT) 4 MG disintegrating tablet Take 1 tablet (4 mg total) by mouth every 6 (six) hours as needed for nausea. 20 tablet 0  . pravastatin (PRAVACHOL) 40 MG tablet Take 40 mg by mouth daily.    . prochlorperazine (COMPAZINE) 10 MG tablet Take 1 tablet (10 mg total) by mouth every 6 (six) hours as needed for nausea or vomiting. 40 tablet 1  . tamsulosin (FLOMAX) 0.4 MG CAPS capsule Take 1 capsule by mouth daily.     No current facility-administered medications for this visit.    VITAL SIGNS: BP (!) 146/89 (BP Location: Right Arm, Patient Position: Sitting)   Pulse 83   Temp (!) 97.3 F (36.3 C) (Tympanic)   Resp 18   Wt 186 lb (84.4 kg)   SpO2 99%   BMI 25.23 kg/m  Filed Weights   11/02/19 1048 11/02/19 1052  Weight: 176 lb 12.8 oz (80.2 kg) 186 lb (84.4 kg)    Estimated body mass index is 25.23 kg/m as calculated from the following:   Height as of 10/16/19: 6'  (1.829 m).   Weight as of this encounter: 186 lb (84.4 kg).  LABS: CBC:    Component Value Date/Time   WBC 5.0 10/30/2019 0834   HGB 12.4 (L) 10/30/2019 0834   HGB 13.3 09/18/2013 0424   HCT 38.4 (L) 10/30/2019 0834   HCT 38.1 (L) 09/18/2013 0424   PLT 132 (L) 10/30/2019 0834   PLT 307 09/18/2013 0424   MCV 99.0 10/30/2019 0834   MCV 94 09/18/2013 0424   NEUTROABS 2.3 10/30/2019 0834   NEUTROABS 17.0 (H) 09/18/2013 0424   LYMPHSABS 1.5 10/30/2019 0834   LYMPHSABS 0.9 (L) 09/18/2013 0424   MONOABS 0.7 10/30/2019 0834   MONOABS 0.4 09/18/2013 0424   EOSABS 0.4 10/30/2019 0834   EOSABS 0.0 09/18/2013 0424   BASOSABS 0.1 10/30/2019 0834   BASOSABS 0.0 09/18/2013 0424   Comprehensive Metabolic Panel:    Component Value Date/Time   NA 139 10/30/2019 0834   NA 137 09/18/2013 0424   K 4.0 10/30/2019 0834   K 4.5 09/18/2013 0424   CL 109 10/30/2019 0834   CL 107 09/18/2013 0424   CO2 24 10/30/2019 0834   CO2 28  09/18/2013 0424   BUN 12 10/30/2019 0834   BUN 19 (H) 09/18/2013 0424   CREATININE 0.67 10/30/2019 0834   CREATININE 1.15 09/18/2013 0424   GLUCOSE 114 (H) 10/30/2019 0834   GLUCOSE 196 (H) 09/18/2013 0424   CALCIUM 8.7 (L) 10/30/2019 0834   CALCIUM 9.1 09/18/2013 0424   AST 23 10/30/2019 0834   ALT 17 10/30/2019 0834   ALKPHOS 97 10/30/2019 0834   BILITOT 0.5 10/30/2019 0834   PROT 6.6 10/30/2019 0834   ALBUMIN 3.4 (L) 10/30/2019 0834    RADIOGRAPHIC STUDIES: CT Chest W Contrast  Result Date: 10/10/2019 CLINICAL DATA:  Colon cancer restaging. Prior right hemicolectomy. Chemotherapy for the last 2 weeks. EXAM: CT CHEST, ABDOMEN, AND PELVIS WITH CONTRAST TECHNIQUE: Multidetector CT imaging of the chest, abdomen and pelvis was performed following the standard protocol during bolus administration of intravenous contrast. CONTRAST:  155m OMNIPAQUE IOHEXOL 300 MG/ML  SOLN COMPARISON:  Multiple exams, including 03/06/2019 and CT chest from 03/06/2016 FINDINGS: CT  CHEST FINDINGS Cardiovascular: Coronary, aortic arch, and branch vessel atherosclerotic vascular disease. Right Port-A-Cath tip: Lower SVC. Mediastinum/Nodes: No pathologic adenopathy in the chest. Mild distal esophageal wall thickening, though as common cause would be esophagitis. Lungs/Pleura: Biapical pleuroparenchymal scarring. Small bands of mucus in the right and left mainstem bronchi. 3 mm subpleural nodule in the left lower lobe on image 113/4, not readily appreciable on prior exams. Musculoskeletal: Thoracic spondylosis. CT ABDOMEN PELVIS FINDINGS Hepatobiliary: Solid lateral segment left hepatic lobe metastatic lesion 4.6 by 3.9 cm on image 63/2, previously 5.2 by 4.4 cm, mildly improved in size. However, there is a newly apparent 1.2 by 1.1 cm hypodense lesion in segment 6 of the liver on image 71/2 suspicious for metastatic lesion. Complex cystic lesion in the dome of the liver measuring 6.9 by 6.4 cm on image 56/2, previous more solid in appearance and previously measuring 4.5 by 4.3 cm. Overall the liver appearance is mixed but given the new lesion there is some concern for progression. Stable mild common bile duct dilatation at 1.1 cm in diameter. Pancreas: Unremarkable Spleen: Unremarkable Adrenals/Urinary Tract: Left kidney lower pole simple appearing cyst. There are 2 nonobstructive left mid kidney calculi, the largest 0.3 cm in diameter on image 51/5. Adrenal glands normal. Stomach/Bowel: Right hemicolectomy. Vascular/Lymphatic: Aortoiliac atherosclerotic vascular disease. Infrarenal abdominal aortic ectasia at 2.8 cm with a small focal dissection in the left side of the infrarenal abdominal aorta as shown on image 79/5. No pathologic adenopathy is identified. Reproductive: Mild prostatomegaly. Other: No supplemental non-categorized findings. Musculoskeletal: Bridging spurring of the right sacroiliac joint. Lumbar spondylosis and degenerative disc disease. IMPRESSION: 1. Although one of the  dominant solid lesions in the left hepatic lobe is reduced in size, there is a new right hepatic lobe metastatic lesion as well as enlargement and cystic degeneration of a dominant lesion in the dome of the liver. Overall the appearance favors progression despite the improvement in 1 of the lesions. 2. No current adenopathy. 3. 3 mm new subpleural nodule in the left lower lobe could be postinflammatory but merit surveillance. 4. Infrarenal abdominal aortic diameter 2.8 cm. Ectatic abdominal aorta at risk for aneurysm development. Recommend followup by ultrasound in 5 years. This recommendation follows ACR consensus guidelines: White Paper of the ACR Incidental Findings Committee II on Vascular Findings. J Am Coll Radiol 2013; 10:789-794. Aortic aneurysm NOS (ICD10-I71.9) 5. Infrarenal abdominal aortic focal dissection. 6. Other imaging findings of potential clinical significance: Aortic Atherosclerosis (ICD10-I70.0). Coronary atherosclerosis. Distal esophageal wall  thickening, probably from esophagitis. Stable mildly dilated common bile duct, cause uncertain. Nonobstructive left nephrolithiasis. Aortic Atherosclerosis (ICD10-I70.0). Mild prostatomegaly. Lumbar spondylosis and degenerative disc disease. Electronically Signed   By: Van Clines M.D.   On: 10/10/2019 14:55   CT ABDOMEN PELVIS W CONTRAST  Result Date: 10/10/2019 CLINICAL DATA:  Colon cancer restaging. Prior right hemicolectomy. Chemotherapy for the last 2 weeks. EXAM: CT CHEST, ABDOMEN, AND PELVIS WITH CONTRAST TECHNIQUE: Multidetector CT imaging of the chest, abdomen and pelvis was performed following the standard protocol during bolus administration of intravenous contrast. CONTRAST:  154m OMNIPAQUE IOHEXOL 300 MG/ML  SOLN COMPARISON:  Multiple exams, including 03/06/2019 and CT chest from 03/06/2016 FINDINGS: CT CHEST FINDINGS Cardiovascular: Coronary, aortic arch, and branch vessel atherosclerotic vascular disease. Right Port-A-Cath tip:  Lower SVC. Mediastinum/Nodes: No pathologic adenopathy in the chest. Mild distal esophageal wall thickening, though as common cause would be esophagitis. Lungs/Pleura: Biapical pleuroparenchymal scarring. Small bands of mucus in the right and left mainstem bronchi. 3 mm subpleural nodule in the left lower lobe on image 113/4, not readily appreciable on prior exams. Musculoskeletal: Thoracic spondylosis. CT ABDOMEN PELVIS FINDINGS Hepatobiliary: Solid lateral segment left hepatic lobe metastatic lesion 4.6 by 3.9 cm on image 63/2, previously 5.2 by 4.4 cm, mildly improved in size. However, there is a newly apparent 1.2 by 1.1 cm hypodense lesion in segment 6 of the liver on image 71/2 suspicious for metastatic lesion. Complex cystic lesion in the dome of the liver measuring 6.9 by 6.4 cm on image 56/2, previous more solid in appearance and previously measuring 4.5 by 4.3 cm. Overall the liver appearance is mixed but given the new lesion there is some concern for progression. Stable mild common bile duct dilatation at 1.1 cm in diameter. Pancreas: Unremarkable Spleen: Unremarkable Adrenals/Urinary Tract: Left kidney lower pole simple appearing cyst. There are 2 nonobstructive left mid kidney calculi, the largest 0.3 cm in diameter on image 51/5. Adrenal glands normal. Stomach/Bowel: Right hemicolectomy. Vascular/Lymphatic: Aortoiliac atherosclerotic vascular disease. Infrarenal abdominal aortic ectasia at 2.8 cm with a small focal dissection in the left side of the infrarenal abdominal aorta as shown on image 79/5. No pathologic adenopathy is identified. Reproductive: Mild prostatomegaly. Other: No supplemental non-categorized findings. Musculoskeletal: Bridging spurring of the right sacroiliac joint. Lumbar spondylosis and degenerative disc disease. IMPRESSION: 1. Although one of the dominant solid lesions in the left hepatic lobe is reduced in size, there is a new right hepatic lobe metastatic lesion as well as  enlargement and cystic degeneration of a dominant lesion in the dome of the liver. Overall the appearance favors progression despite the improvement in 1 of the lesions. 2. No current adenopathy. 3. 3 mm new subpleural nodule in the left lower lobe could be postinflammatory but merit surveillance. 4. Infrarenal abdominal aortic diameter 2.8 cm. Ectatic abdominal aorta at risk for aneurysm development. Recommend followup by ultrasound in 5 years. This recommendation follows ACR consensus guidelines: White Paper of the ACR Incidental Findings Committee II on Vascular Findings. J Am Coll Radiol 2013; 10:789-794. Aortic aneurysm NOS (ICD10-I71.9) 5. Infrarenal abdominal aortic focal dissection. 6. Other imaging findings of potential clinical significance: Aortic Atherosclerosis (ICD10-I70.0). Coronary atherosclerosis. Distal esophageal wall thickening, probably from esophagitis. Stable mildly dilated common bile duct, cause uncertain. Nonobstructive left nephrolithiasis. Aortic Atherosclerosis (ICD10-I70.0). Mild prostatomegaly. Lumbar spondylosis and degenerative disc disease. Electronically Signed   By: WVan ClinesM.D.   On: 10/10/2019 14:55    PERFORMANCE STATUS (ECOG) : 1 - Symptomatic but completely ambulatory  Review of Systems Unless otherwise noted, a complete review of systems is negative.  Physical Exam General: NAD, frail appearing, thin Pulmonary: Unlabored Extremities: no edema, no joint deformities Skin: no rashes Neurological: Weakness but otherwise nonfocal  IMPRESSION: Routine follow-up visit.  Patient reports that he is doing well.  He denies any significant changes or concerns.  No distressing symptoms were reported.  Patient says he plans to play golf later this week.  Appetite and weight are stable.  We discussed CODE STATUS.  Patient says he would not want to be resuscitated or have his life prolonged artificially machines.   I completed a MOST form today. The  patient and family outlined their wishes for the following treatment decisions:  Cardiopulmonary Resuscitation: Do Not Attempt Resuscitation (DNR/No CPR)  Medical Interventions: Limited Additional Interventions: Use medical treatment, IV fluids and cardiac monitoring as indicated, DO NOT USE intubation or mechanical ventilation. May consider use of less invasive airway support such as BiPAP or CPAP. Also provide comfort measures. Transfer to the hospital if indicated. Avoid intensive care.   Antibiotics: Antibiotics if indicated  IV Fluids: IV fluids if indicated  Feeding Tube: No feeding tube    PLAN: -Continue current scope of treatment -DNR/DNI -MOST form completed  -Follow-up telephone visit in 3-4 weeks   Patient expressed understanding and was in agreement with this plan. He also understands that He can call the clinic at any time with any questions, concerns, or complaints.     Time Total: 30 minutes  Visit consisted of counseling and education dealing with the complex and emotionally intense issues of symptom management and palliative care in the setting of serious and potentially life-threatening illness.Greater than 50%  of this time was spent counseling and coordinating care related to the above assessment and plan.  Signed by: Altha Harm, PhD, NP-C

## 2019-11-10 ENCOUNTER — Ambulatory Visit: Payer: Medicare HMO | Attending: Internal Medicine

## 2019-11-10 ENCOUNTER — Telehealth: Payer: Self-pay | Admitting: *Deleted

## 2019-11-10 DIAGNOSIS — Z20822 Contact with and (suspected) exposure to covid-19: Secondary | ICD-10-CM

## 2019-11-10 NOTE — Telephone Encounter (Signed)
Patient called reporting that his brother has been diagnosed with COVID Tuesday and that he was with him for 4 hours the previous Saturday. Asking if he needs to reschedule his Monday appointment Please advise

## 2019-11-10 NOTE — Telephone Encounter (Signed)
Contacted patient - patient instructed that his apt will be post poned. Pt provided with covid testing site information.

## 2019-11-11 LAB — NOVEL CORONAVIRUS, NAA: SARS-CoV-2, NAA: NOT DETECTED

## 2019-11-13 ENCOUNTER — Inpatient Hospital Stay: Payer: Medicare HMO

## 2019-11-13 ENCOUNTER — Inpatient Hospital Stay: Payer: Medicare HMO | Admitting: Internal Medicine

## 2019-11-13 NOTE — Telephone Encounter (Signed)
Per Dr. Jacinto Reap - pt's covid testing is negative; however, md would like to take precautions due to the pt's exposure. Please reschedule pt's treatment in 1 week.

## 2019-11-15 ENCOUNTER — Inpatient Hospital Stay: Payer: Medicare HMO

## 2019-11-21 ENCOUNTER — Other Ambulatory Visit: Payer: Self-pay

## 2019-11-21 NOTE — Progress Notes (Signed)
Patient pre screened for office appointment, no questions or concerns today. Patient reminded of upcoming appointment time and date. 

## 2019-11-22 ENCOUNTER — Inpatient Hospital Stay: Payer: Medicare HMO

## 2019-11-22 ENCOUNTER — Inpatient Hospital Stay (HOSPITAL_BASED_OUTPATIENT_CLINIC_OR_DEPARTMENT_OTHER): Payer: Medicare HMO | Admitting: Internal Medicine

## 2019-11-22 ENCOUNTER — Inpatient Hospital Stay: Payer: Medicare HMO | Attending: Internal Medicine

## 2019-11-22 ENCOUNTER — Other Ambulatory Visit: Payer: Self-pay

## 2019-11-22 DIAGNOSIS — Z5112 Encounter for antineoplastic immunotherapy: Secondary | ICD-10-CM | POA: Insufficient documentation

## 2019-11-22 DIAGNOSIS — Z5111 Encounter for antineoplastic chemotherapy: Secondary | ICD-10-CM | POA: Diagnosis not present

## 2019-11-22 DIAGNOSIS — C182 Malignant neoplasm of ascending colon: Secondary | ICD-10-CM | POA: Diagnosis not present

## 2019-11-22 DIAGNOSIS — C787 Secondary malignant neoplasm of liver and intrahepatic bile duct: Secondary | ICD-10-CM

## 2019-11-22 DIAGNOSIS — Z95828 Presence of other vascular implants and grafts: Secondary | ICD-10-CM

## 2019-11-22 DIAGNOSIS — Z7189 Other specified counseling: Secondary | ICD-10-CM

## 2019-11-22 LAB — CBC WITH DIFFERENTIAL/PLATELET
Abs Immature Granulocytes: 0 10*3/uL (ref 0.00–0.07)
Basophils Absolute: 0 10*3/uL (ref 0.0–0.1)
Basophils Relative: 1 %
Eosinophils Absolute: 0.4 10*3/uL (ref 0.0–0.5)
Eosinophils Relative: 11 %
HCT: 39.4 % (ref 39.0–52.0)
Hemoglobin: 12.8 g/dL — ABNORMAL LOW (ref 13.0–17.0)
Immature Granulocytes: 0 %
Lymphocytes Relative: 35 %
Lymphs Abs: 1.4 10*3/uL (ref 0.7–4.0)
MCH: 32.7 pg (ref 26.0–34.0)
MCHC: 32.5 g/dL (ref 30.0–36.0)
MCV: 100.8 fL — ABNORMAL HIGH (ref 80.0–100.0)
Monocytes Absolute: 0.5 10*3/uL (ref 0.1–1.0)
Monocytes Relative: 14 %
Neutro Abs: 1.6 10*3/uL — ABNORMAL LOW (ref 1.7–7.7)
Neutrophils Relative %: 39 %
Platelets: 140 10*3/uL — ABNORMAL LOW (ref 150–400)
RBC: 3.91 MIL/uL — ABNORMAL LOW (ref 4.22–5.81)
RDW: 13.5 % (ref 11.5–15.5)
WBC: 3.9 10*3/uL — ABNORMAL LOW (ref 4.0–10.5)
nRBC: 0 % (ref 0.0–0.2)

## 2019-11-22 LAB — URINALYSIS, COMPLETE (UACMP) WITH MICROSCOPIC
Bacteria, UA: NONE SEEN
Bilirubin Urine: NEGATIVE
Glucose, UA: NEGATIVE mg/dL
Hgb urine dipstick: NEGATIVE
Ketones, ur: NEGATIVE mg/dL
Leukocytes,Ua: NEGATIVE
Nitrite: NEGATIVE
Protein, ur: 30 mg/dL — AB
Specific Gravity, Urine: 1.031 — ABNORMAL HIGH (ref 1.005–1.030)
pH: 5 (ref 5.0–8.0)

## 2019-11-22 LAB — COMPREHENSIVE METABOLIC PANEL
ALT: 15 U/L (ref 0–44)
AST: 23 U/L (ref 15–41)
Albumin: 3.3 g/dL — ABNORMAL LOW (ref 3.5–5.0)
Alkaline Phosphatase: 93 U/L (ref 38–126)
Anion gap: 6 (ref 5–15)
BUN: 12 mg/dL (ref 8–23)
CO2: 25 mmol/L (ref 22–32)
Calcium: 8.6 mg/dL — ABNORMAL LOW (ref 8.9–10.3)
Chloride: 106 mmol/L (ref 98–111)
Creatinine, Ser: 0.8 mg/dL (ref 0.61–1.24)
GFR calc Af Amer: >60 mL/min (ref >60)
GFR calc non Af Amer: >60 mL/min (ref >60)
Glucose, Bld: 186 mg/dL — ABNORMAL HIGH (ref 70–99)
Potassium: 3.7 mmol/L (ref 3.5–5.1)
Sodium: 137 mmol/L (ref 135–145)
Total Bilirubin: 0.5 mg/dL (ref 0.3–1.2)
Total Protein: 6.4 g/dL — ABNORMAL LOW (ref 6.5–8.1)

## 2019-11-22 MED ORDER — LEUCOVORIN CALCIUM INJECTION 350 MG
379.0000 mg/m2 | Freq: Once | INTRAVENOUS | Status: AC
  Administered 2019-11-22: 12:00:00 800 mg via INTRAVENOUS
  Filled 2019-11-22: qty 25

## 2019-11-22 MED ORDER — SODIUM CHLORIDE 0.9 % IV SOLN
5.0000 mg/kg | Freq: Once | INTRAVENOUS | Status: AC
  Administered 2019-11-22: 11:00:00 400 mg via INTRAVENOUS
  Filled 2019-11-22: qty 16

## 2019-11-22 MED ORDER — SODIUM CHLORIDE 0.9 % IV SOLN
2369.0000 mg/m2 | INTRAVENOUS | Status: DC
  Administered 2019-11-22: 5000 mg via INTRAVENOUS
  Filled 2019-11-22: qty 100

## 2019-11-22 MED ORDER — SODIUM CHLORIDE 0.9 % IV SOLN
Freq: Once | INTRAVENOUS | Status: AC
  Filled 2019-11-22: qty 250

## 2019-11-22 MED ORDER — SODIUM CHLORIDE 0.9% FLUSH
10.0000 mL | INTRAVENOUS | Status: DC | PRN
  Administered 2019-11-22: 14:00:00 10 mL
  Filled 2019-11-22: qty 10

## 2019-11-22 MED ORDER — DEXTROSE 5 % IV SOLN
Freq: Once | INTRAVENOUS | Status: AC
  Filled 2019-11-22: qty 250

## 2019-11-22 MED ORDER — PALONOSETRON HCL INJECTION 0.25 MG/5ML
0.2500 mg | Freq: Once | INTRAVENOUS | Status: AC
  Administered 2019-11-22: 10:00:00 0.25 mg via INTRAVENOUS
  Filled 2019-11-22: qty 5

## 2019-11-22 MED ORDER — SODIUM CHLORIDE 0.9% FLUSH
10.0000 mL | Freq: Once | INTRAVENOUS | Status: AC
  Administered 2019-11-22: 10 mL via INTRAVENOUS
  Filled 2019-11-22: qty 10

## 2019-11-22 MED ORDER — OXALIPLATIN CHEMO INJECTION 100 MG/20ML
85.0000 mg/m2 | Freq: Once | INTRAVENOUS | Status: AC
  Administered 2019-11-22: 12:00:00 180 mg via INTRAVENOUS
  Filled 2019-11-22: qty 36

## 2019-11-22 MED ORDER — DEXAMETHASONE SODIUM PHOSPHATE 10 MG/ML IJ SOLN
10.0000 mg | Freq: Once | INTRAMUSCULAR | Status: AC
  Administered 2019-11-22: 11:00:00 10 mg via INTRAVENOUS
  Filled 2019-11-22: qty 1

## 2019-11-22 NOTE — Assessment & Plan Note (Addendum)
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  Currently on FOLFOX plus Avastin; DEC 22nd- CT [compared to Sep 4th CT-[facility]]- overall stable; slight progression. Stable.  CEA- stable.   #Proceed with today- cycle 9 of FOLFOX plus Avastin chemotherapy.  Labs today reviewed;  acceptable for treatment today.   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 stable  Monitor for now.  # HTN- systolic 0000000 stable; Improved; monitor closely.  # left shoulder pain- ? Tear- monitor for now.   # # I discussed regarding Covid-19 precautions.  I reviewed the vaccine effectiveness and potential side effects in detail.  Also discussed long-term effectiveness and safety profile are unclear at this time.  I discussed December, 2020 ASCO position statement-that all patients are recommended COVID-19 vaccinations [when available]-as long as they do not have allergy to components of the vaccine.  However, I think the benefits of the vaccination outweigh the potential risks. Re: [id number] vaccination.  For more information/scheduling recommend call [phone], 8:30am-4:30pm.    # DISPOSITION: #FOLFOX+ Avastin today # follow up in 2 weeks- MD; labs- cbc/cmp/UA/ CEA; FOLFOX + avastin;pump off in 2 days- Dr.B

## 2019-11-22 NOTE — Patient Instructions (Signed)
Re: COVID-19 vaccination.  For more information/scheduling recommend call [place] [facility]- [phone], 8:30am-4:30pm.   

## 2019-11-22 NOTE — Progress Notes (Signed)
[name] NOTE  Patient Care Team: Sofie Hartigan, MD as PCP - General (Family Medicine) [name], MD as Consulting Physician (Hematology and Oncology)  CHIEF COMPLAINTS/PURPOSE OF CONSULTATION: Colon cancer  #  Oncology History Overview Note  # MAY 2020- 3. 03/09/19 Liver biopsy. Microscopic examination shows malignant cells with glandular architecture consistent with adenocarcinoma. The malignant cells are positive for CK20 and CDX-2. These findings support the clinical impression of metastatic colon adenocarcinoma. 4. 03/10/19 R hemicolectomy. Tumor site cecum. Adenocarcinoma. Mucinous features present. G2. No tumor deposits. Invades visceral peritoneum. No tumor perforation. LVI present. PNI not identified. All margins uninvolved. 1/12 LNs. PT4apN1. Periappendiceal inflammation c/w resolving abscess. Microsatellite stable (MSS). [Dr.Mettu; [facility]]  # SEP 4th 2020 [compared to May 2020]  Interval increase in size of the metastases to the hepatic dome, The metastasis to the left hepatic lobe is unchanged; 2.  New subcentimeter hypoattenuating lesion in the inferior right hepatic lobe, incompletely characterized on CT. 3.  Postsurgical changes following right hemicolectomy.  # OCT 2020- FOLFOX +avastin; CT dec 22nd 2020- [compared to [facility] sep 9th 2020]-Liver- slight progression versus stable disease.  # NGS/F-ONE-MUTATED K-RAS [G]  # PALLIATIVE CARE EVALUATION: 09/20/2019-Oscar [name]  # PAIN MANAGEMENT: NA   DIAGNOSIS: COLON CANCER  STAGE:  IV     ;  GOALS:Palliative  CURRENT/MOST RECENT THERAPY : FOLFOX+ avastin [C]    Cancer of right colon ([place])  07/05/2019 Initial Diagnosis   Cancer of right colon ([place])   07/24/2019 -  Chemotherapy   The patient had palonosetron (ALOXI) injection 0.25 mg, 0.25 mg, Intravenous,  Once, 9 of 12 cycles Administration: 0.25 mg (07/24/2019), 0.25 mg (08/07/2019), 0.25 mg (08/21/2019), 0.25 mg (09/04/2019), 0.25 mg (09/20/2019), 0.25 mg  (10/04/2019), 0.25 mg (10/16/2019), 0.25 mg (10/30/2019) leucovorin 800 mg in dextrose 5 % 250 mL infusion, 844 mg, Intravenous,  Once, 9 of 12 cycles Administration: 800 mg (07/24/2019), 800 mg (08/07/2019), 800 mg (08/21/2019), 800 mg (09/04/2019), 800 mg (09/20/2019), 800 mg (10/04/2019), 800 mg (10/16/2019), 800 mg (10/30/2019) oxaliplatin (ELOXATIN) 180 mg in dextrose 5 % 500 mL chemo infusion, 85 mg/m2 = 180 mg, Intravenous,  Once, 9 of 12 cycles Administration: 180 mg (07/24/2019), 180 mg (08/07/2019), 180 mg (08/21/2019), 180 mg (09/04/2019), 180 mg (09/20/2019), 180 mg (10/04/2019), 180 mg (10/16/2019), 180 mg (10/30/2019) fluorouracil (ADRUCIL) 5,000 mg in sodium chloride 0.9 % 150 mL chemo infusion, 5,050 mg, Intravenous, 1 Day/Dose, 9 of 12 cycles Administration: 5,000 mg (07/24/2019), 5,000 mg (08/07/2019), 5,000 mg (08/21/2019), 5,050 mg (09/04/2019), 5,000 mg (10/04/2019), 5,000 mg (10/16/2019) bevacizumab-bvzr (ZIRABEV) 400 mg in sodium chloride 0.9 % 100 mL chemo infusion, 5 mg/kg = 400 mg, Intravenous,  Once, 9 of 12 cycles Administration: 400 mg (08/07/2019), 400 mg (08/21/2019), 400 mg (09/04/2019), 400 mg (09/20/2019), 400 mg (10/04/2019), 400 mg (10/16/2019), 400 mg (10/30/2019)  for chemotherapy treatment.      HISTORY OF PRESENTING ILLNESS:  Oscar [name] 79 y.o.  male with metastatic colon cancer to the liver currently on FOLFOX plus Avastin is here for follow-up.  Patient's chemotherapy was held last week-because of recent exposure to Covid.  Patient's brother was diagnosed with Covid-and patient was inadvertently exposed-however he tested negative.  He denies any new shortness of breath or cough.  Appetite is good but no fevers.  Mild tingling and numbness in extremities.  Otherwise no nausea no vomiting.  Complains of left shoulder pain chronic.  Takes meloxicam as needed.  Review of Systems  Constitutional: Positive  for malaise/fatigue. Negative for chills, diaphoresis, fever  and weight loss.  HENT: Negative for nosebleeds and sore throat.   Eyes: Negative for double vision.  Respiratory: Negative for cough, hemoptysis, sputum production, shortness of breath and wheezing.   Cardiovascular: Negative for chest pain, palpitations, orthopnea and leg swelling.  Gastrointestinal: Negative for abdominal pain, blood in stool, constipation, diarrhea, heartburn, melena, nausea and vomiting.  Genitourinary: Negative for dysuria, frequency and urgency.  Musculoskeletal: Positive for back pain and joint pain.  Skin: Negative.  Negative for itching and rash.  Neurological: Positive for tingling. Negative for dizziness, focal weakness, weakness and headaches.  Endo/Heme/Allergies: Does not bruise/bleed easily.  Psychiatric/Behavioral: Negative for depression. The patient is not nervous/anxious and does not have insomnia.      MEDICAL HISTORY:  Past Medical History:  Diagnosis Date  . Arthritis   . Cancer ([place])    colon cancer 02/2019 per pt   . Diabetes mellitus without complication ([place])   . H/O colon cancer, stage IV   . Hyperlipemia   . Hypertension     SURGICAL HISTORY: Past Surgical History:  Procedure Laterality Date  . IR IMAGING GUIDED PORT INSERTION  07/20/2019  . JOINT REPLACEMENT      SOCIAL HISTORY: Social History   Socioeconomic History  . Marital status: Married    Spouse name: Not on file  . Number of children: Not on file  . Years of education: Not on file  . Highest education level: Not on file  Occupational History  . Not on file  Tobacco Use  . Smoking status: Current Every Day Smoker    Packs/day: 0.25    Types: Cigarettes  . Smokeless tobacco: Never Used  . Tobacco comment: 1 to 2 cigarettes a day occasionally  Substance and Sexual Activity  . Alcohol use: No  . Drug use: No  . Sexual activity: Not on file  Other Topics Concern  . Not on file  Social History Narrative   Recruitment consultant retd; lives in [place];  smoking 3cig/day; [3/4 ppd x started at 9 years]; no alcohol. Son & daughter; wife dementia [waiting for placement].    Social Determinants of Health   Financial Resource Strain:   . Difficulty of Paying Living Expenses: Not on file  Food Insecurity:   . Worried About Charity fundraiser in the Last Year: Not on file  . Ran Out of Food in the Last Year: Not on file  Transportation Needs:   . Lack of Transportation (Medical): Not on file  . Lack of Transportation (Non-Medical): Not on file  Physical Activity:   . Days of Exercise per Week: Not on file  . Minutes of Exercise per Session: Not on file  Stress:   . Feeling of Stress : Not on file  Social Connections:   . Frequency of Communication with Friends and Family: Not on file  . Frequency of Social Gatherings with Friends and Family: Not on file  . Attends Religious Services: Not on file  . Active Member of Clubs or Organizations: Not on file  . Attends Archivist Meetings: Not on file  . Marital Status: Not on file  Intimate Partner Violence:   . Fear of Current or Ex-Partner: Not on file  . Emotionally Abused: Not on file  . Physically Abused: Not on file  . Sexually Abused: Not on file    FAMILY HISTORY: Family History  Problem Relation Age of Onset  . Peptic Ulcer Disease Father  ALLERGIES:  is allergic to ace inhibitors.  MEDICATIONS:  Current Outpatient Medications  Medication Sig Dispense Refill  . amLODipine (NORVASC) 5 MG tablet Take 5 mg by mouth daily.    . fluticasone (FLONASE) 50 MCG/ACT nasal spray Place 1 spray into both nostrils daily as needed.     . lidocaine-prilocaine (EMLA) cream Apply 1 application topically as needed. 30 g 0  . meloxicam (MOBIC) 15 MG tablet Take 15 mg by mouth daily.    . ondansetron (ZOFRAN-ODT) 4 MG disintegrating tablet Take 1 tablet (4 mg total) by mouth every 6 (six) hours as needed for nausea. 20 tablet 0  . pravastatin (PRAVACHOL) 40 MG tablet Take 40 mg by  mouth daily.    . prochlorperazine (COMPAZINE) 10 MG tablet Take 1 tablet (10 mg total) by mouth every 6 (six) hours as needed for nausea or vomiting. 40 tablet 1  . tamsulosin (FLOMAX) 0.4 MG CAPS capsule Take 1 capsule by mouth daily.     No current facility-administered medications for this visit.   Facility-Administered Medications Ordered in Other Visits  Medication Dose Route Frequency Provider Last Rate Last Admin  . fluorouracil (ADRUCIL) 5,000 mg in sodium chloride 0.9 % 150 mL chemo infusion  2,369 mg/m2 (Treatment Plan Recorded) Intravenous 1 day or 1 dose Charlaine Dalton R, MD      . leucovorin 800 mg in dextrose 5 % 250 mL infusion  379 mg/m2 (Treatment Plan Recorded) Intravenous Once Cammie Sickle, MD 145 mL/hr at 11/22/19 1159 800 mg at 11/22/19 1159  . oxaliplatin (ELOXATIN) 180 mg in dextrose 5 % 500 mL chemo infusion  85 mg/m2 (Treatment Plan Recorded) Intravenous Once Cammie Sickle, MD 268 mL/hr at 11/22/19 1158 180 mg at 11/22/19 1158      .  PHYSICAL EXAMINATION: ECOG PERFORMANCE STATUS: 0 - Asymptomatic  Vitals:   11/22/19 0843  BP: 132/78  Pulse: 67  Resp: 20  Temp: (!) 97.1 F (36.2 C)   Filed Weights   11/22/19 0843  Weight: 186 lb (84.4 kg)    Physical Exam  Constitutional: He is oriented to person, place, and time and well-developed, well-nourished, and in no distress.  HENT:  Head: Normocephalic and atraumatic.  Mouth/Throat: Oropharynx is clear and moist. No oropharyngeal exudate.  Eyes: Pupils are equal, round, and reactive to light.  Cardiovascular: Normal rate and regular rhythm.  Pulmonary/Chest: No respiratory distress. He has no wheezes.  Abdominal: Soft. Bowel sounds are normal. He exhibits no distension and no mass. There is no abdominal tenderness. There is no rebound and no guarding.  Musculoskeletal:        General: No tenderness or edema. Normal range of motion.     Cervical back: Normal range of motion and neck  supple.  Neurological: He is alert and oriented to person, place, and time.  Skin: Skin is warm.  Psychiatric: Affect normal.   LABORATORY DATA:  I have reviewed the data as listed Lab Results  Component Value Date   WBC 3.9 (L) 11/22/2019   HGB 12.8 (L) 11/22/2019   HCT 39.4 11/22/2019   MCV 100.8 (H) 11/22/2019   PLT 140 (L) 11/22/2019   Recent Labs    10/16/19 0832 10/30/19 0834 11/22/19 0819  NA 141 139 137  K 3.5 4.0 3.7  CL 110 109 106  CO2 _0 GLUCOSE 133* 114* 186*  BUN _1 CREATININE 0.67 0.67 0.80  CALCIUM 8.6* 8.7* 8.6*  GFRNONAA >60 >60 >  60  GFRAA >60 >60 >60  PROT 6.6 6.6 6.4*  ALBUMIN 3.4* 3.4* 3.3*  AST _0 ALT _1 ALKPHOS 94 97 93  BILITOT 0.6 0.5 0.5    RADIOGRAPHIC STUDIES: I have personally reviewed the radiological images as listed and agreed with the findings in the report. No results found.  ASSESSMENT & PLAN:   Cancer of right colon ([place]) #Right-sided colon adenocarcinoma-with synchronous metastasis to liver/unresectable.  Currently on FOLFOX plus Avastin; DEC 22nd- CT [compared to Sep 4th CT-[facility]]- overall stable; slight progression. Stable.  CEA- stable.   #Proceed with today- cycle 9 of FOLFOX plus Avastin chemotherapy.  Labs today reviewed;  acceptable for treatment today.   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 stable  Monitor for now.  # HTN- systolic 599U stable; Improved; monitor closely.  # left shoulder pain- ? Tear- monitor for now.   # # I discussed regarding Covid-19 precautions.  I reviewed the vaccine effectiveness and potential side effects in detail.  Also discussed long-term effectiveness and safety profile are unclear at this time.  I discussed December, 2020 ASCO position statement-that all patients are recommended COVID-19 vaccinations [when available]-as long as they do not have allergy to components of the vaccine.  However, I think the benefits of the vaccination outweigh the potential risks. Re:  [id number] vaccination.  For more information/scheduling recommend call [phone], 8:30am-4:30pm.    # DISPOSITION: #FOLFOX+ Avastin today # follow up in 2 weeks- MD; labs- cbc/cmp/UA/ CEA; FOLFOX + avastin;pump off in 2 days- Dr.B  All questions were answered. The patient knows to call the clinic with any problems, questions or concerns.    Cammie Sickle, MD 11/22/2019 1:26 PM

## 2019-11-23 ENCOUNTER — Inpatient Hospital Stay (HOSPITAL_BASED_OUTPATIENT_CLINIC_OR_DEPARTMENT_OTHER): Payer: Medicare HMO | Admitting: Nurse Practitioner

## 2019-11-23 DIAGNOSIS — C182 Malignant neoplasm of ascending colon: Secondary | ICD-10-CM | POA: Diagnosis not present

## 2019-11-23 LAB — CEA: CEA: 18 ng/mL — ABNORMAL HIGH (ref 0.0–4.7)

## 2019-11-23 NOTE — Progress Notes (Signed)
[place]  Telephone:([phone] [phone]  Virtual Visit Progress Note  I connected with Oscar [name] on 11/23/19 at  2:30 PM EST by telephone visit and verified that I am speaking with the correct person using two identifiers.   I discussed the limitations, risks, security and privacy concerns of performing an evaluation and management service by telemedicine and the availability of in-person appointments. I also discussed with the patient that there may be a patient responsible charge related to this service. The patient expressed understanding and agreed to proceed.   Other persons participating in the visit and their role in the encounter: none  Patient's location: home Provider's location: home  Chief Complaint: stage IV Colon Cancer  Name: Oscar [name] Date: 11/23/2019 MRN: [id number]  DOB: [birth year]  Patient Care Team: Sofie Hartigan, MD as PCP - General (Family Medicine) [name], MD as Consulting Physician (Hematology and Oncology)    REASON FOR CONSULTATION: Palliative Care consult requested for this 79 y.o. male with multiple medical problems including stage IV colon cancer on treatment with FOLFOX.  Patient was referred to palliative care to help address goals and manage ongoing symptoms.  SOCIAL HISTORY:     reports that he has been smoking cigarettes. He has been smoking about 0.25 packs per day. He has never used smokeless tobacco. He reports that he does not drink alcohol or use drugs.   Patient is married.  He and his wife live with his stepdaughter.  His wife has advanced dementia and is followed at home by hospice.  Patient has a son and daughter and two twin stepdaughters.  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  ADVANCE DIRECTIVES:  Does not have  CODE STATUS: DNR/DNI (MOST form completed on 11/02/2019)  Cardiopulmonary Resuscitation: Do Not Attempt Resuscitation (DNR/No  CPR)  Medical Interventions: Limited Additional Interventions: Use medical treatment, IV fluids and cardiac monitoring as indicated, DO NOT USE intubation or mechanical ventilation. May consider use of less invasive airway support such as BiPAP or CPAP. Also provide comfort measures. Transfer to the hospital if indicated. Avoid intensive care.   Antibiotics: Antibiotics if indicated  IV Fluids: IV fluids if indicated  Feeding Tube: No feeding tube    PAST MEDICAL HISTORY: Past Medical History:  Diagnosis Date  . Arthritis   . Cancer ([place])    colon cancer 02/2019 per pt   . Diabetes mellitus without complication ([place])   . H/O colon cancer, stage IV   . Hyperlipemia   . Hypertension     PAST SURGICAL HISTORY:  Past Surgical History:  Procedure Laterality Date  . IR IMAGING GUIDED PORT INSERTION  07/20/2019  . JOINT REPLACEMENT      HEMATOLOGY/ONCOLOGY HISTORY:  Oncology History Overview Note  # MAY 2020- 3. 03/09/19 Liver biopsy. Microscopic examination shows malignant cells with glandular architecture consistent with adenocarcinoma. The malignant cells are positive for CK20 and CDX-2. These findings support the clinical impression of metastatic colon adenocarcinoma. 4. 03/10/19 R hemicolectomy. Tumor site cecum. Adenocarcinoma. Mucinous features present. G2. No tumor deposits. Invades visceral peritoneum. No tumor perforation. LVI present. PNI not identified. All margins uninvolved. 1/12 LNs. PT4apN1. Periappendiceal inflammation c/w resolving abscess. Microsatellite stable (MSS). [Dr.Mettu; [facility]]  # SEP 4th 2020 [compared to May 2020]  Interval increase in size of the metastases to the hepatic dome, The metastasis to the left hepatic lobe is unchanged; 2.  New subcentimeter hypoattenuating lesion in the inferior  right hepatic lobe, incompletely characterized on CT. 3.  Postsurgical changes following right hemicolectomy.  # OCT 2020- FOLFOX +avastin; CT dec 22nd 2020- [compared to [facility]  sep 9th 2020]-Liver- slight progression versus stable disease.  # NGS/F-ONE-MUTATED K-RAS [G]  # PALLIATIVE CARE EVALUATION: 09/20/2019-[name]  # PAIN MANAGEMENT: NA   DIAGNOSIS: COLON CANCER  STAGE:  IV     ;  GOALS:Palliative  CURRENT/MOST RECENT THERAPY : FOLFOX+ avastin [C]    Cancer of right colon ([place])  07/05/2019 Initial Diagnosis   Cancer of right colon ([place])   07/24/2019 -  Chemotherapy   The patient had palonosetron (ALOXI) injection 0.25 mg, 0.25 mg, Intravenous,  Once, 9 of 12 cycles Administration: 0.25 mg (07/24/2019), 0.25 mg (08/07/2019), 0.25 mg (08/21/2019), 0.25 mg (09/04/2019), 0.25 mg (09/20/2019), 0.25 mg (10/04/2019), 0.25 mg (10/16/2019), 0.25 mg (10/30/2019), 0.25 mg (11/22/2019) leucovorin 800 mg in dextrose 5 % 250 mL infusion, 844 mg, Intravenous,  Once, 9 of 12 cycles Administration: 800 mg (07/24/2019), 800 mg (08/07/2019), 800 mg (08/21/2019), 800 mg (09/04/2019), 800 mg (09/20/2019), 800 mg (10/04/2019), 800 mg (10/16/2019), 800 mg (10/30/2019), 800 mg (11/22/2019) oxaliplatin (ELOXATIN) 180 mg in dextrose 5 % 500 mL chemo infusion, 85 mg/m2 = 180 mg, Intravenous,  Once, 9 of 12 cycles Administration: 180 mg (07/24/2019), 180 mg (08/07/2019), 180 mg (08/21/2019), 180 mg (09/04/2019), 180 mg (09/20/2019), 180 mg (10/04/2019), 180 mg (10/16/2019), 180 mg (10/30/2019), 180 mg (11/22/2019) fluorouracil (ADRUCIL) 5,000 mg in sodium chloride 0.9 % 150 mL chemo infusion, 5,050 mg, Intravenous, 1 Day/Dose, 9 of 12 cycles Administration: 5,000 mg (07/24/2019), 5,000 mg (08/07/2019), 5,000 mg (08/21/2019), 5,050 mg (09/04/2019), 5,000 mg (10/04/2019), 5,000 mg (10/16/2019) bevacizumab-bvzr (ZIRABEV) 400 mg in sodium chloride 0.9 % 100 mL chemo infusion, 5 mg/kg = 400 mg, Intravenous,  Once, 9 of 12 cycles Administration: 400 mg (08/07/2019), 400 mg (08/21/2019), 400 mg (09/04/2019), 400 mg (09/20/2019), 400 mg (10/04/2019), 400 mg (10/16/2019), 400 mg (10/30/2019), 400 mg (11/22/2019)  for  chemotherapy treatment.      ALLERGIES:  is allergic to ace inhibitors.  MEDICATIONS:  Current Outpatient Medications  Medication Sig Dispense Refill  . amLODipine (NORVASC) 5 MG tablet Take 5 mg by mouth daily.    . fluticasone (FLONASE) 50 MCG/ACT nasal spray Place 1 spray into both nostrils daily as needed.     . lidocaine-prilocaine (EMLA) cream Apply 1 application topically as needed. 30 g 0  . meloxicam (MOBIC) 15 MG tablet Take 15 mg by mouth daily.    . ondansetron (ZOFRAN-ODT) 4 MG disintegrating tablet Take 1 tablet (4 mg total) by mouth every 6 (six) hours as needed for nausea. 20 tablet 0  . pravastatin (PRAVACHOL) 40 MG tablet Take 40 mg by mouth daily.    . prochlorperazine (COMPAZINE) 10 MG tablet Take 1 tablet (10 mg total) by mouth every 6 (six) hours as needed for nausea or vomiting. 40 tablet 1  . tamsulosin (FLOMAX) 0.4 MG CAPS capsule Take 1 capsule by mouth daily.     No current facility-administered medications for this visit.    VITAL SIGNS: There were no vitals taken for this visit. There were no vitals filed for this visit.  Estimated body mass index is 25.23 kg/m as calculated from the following:   Height as of 11/22/19: 6' (1.829 m).   Weight as of 11/22/19: 186 lb (84.4 kg).  LABS: CBC:    Component Value Date/Time   WBC 3.9 (L) 11/22/2019 0819   HGB 12.8 (L) 11/22/2019  0819   HGB 13.3 09/18/2013 0424   HCT 39.4 11/22/2019 0819   HCT 38.1 (L) 09/18/2013 0424   PLT 140 (L) 11/22/2019 0819   PLT 307 09/18/2013 0424   MCV 100.8 (H) 11/22/2019 0819   MCV 94 09/18/2013 0424   NEUTROABS 1.6 (L) 11/22/2019 0819   NEUTROABS 17.0 (H) 09/18/2013 0424   LYMPHSABS 1.4 11/22/2019 0819   LYMPHSABS 0.9 (L) 09/18/2013 0424   MONOABS 0.5 11/22/2019 0819   MONOABS 0.4 09/18/2013 0424   EOSABS 0.4 11/22/2019 0819   EOSABS 0.0 09/18/2013 0424   BASOSABS 0.0 11/22/2019 0819   BASOSABS 0.0 09/18/2013 0424   Comprehensive Metabolic Panel:    Component Value  Date/Time   NA 137 11/22/2019 0819   NA 137 09/18/2013 0424   K 3.7 11/22/2019 0819   K 4.5 09/18/2013 0424   CL 106 11/22/2019 0819   CL 107 09/18/2013 0424   CO2 25 11/22/2019 0819   CO2 28 09/18/2013 0424   BUN 12 11/22/2019 0819   BUN 19 (H) 09/18/2013 0424   CREATININE 0.80 11/22/2019 0819   CREATININE 1.15 09/18/2013 0424   GLUCOSE 186 (H) 11/22/2019 0819   GLUCOSE 196 (H) 09/18/2013 0424   CALCIUM 8.6 (L) 11/22/2019 0819   CALCIUM 9.1 09/18/2013 0424   AST 23 11/22/2019 0819   ALT 15 11/22/2019 0819   ALKPHOS 93 11/22/2019 0819   BILITOT 0.5 11/22/2019 0819   PROT 6.4 (L) 11/22/2019 0819   ALBUMIN 3.3 (L) 11/22/2019 0819    RADIOGRAPHIC STUDIES: No results found.  PERFORMANCE STATUS (ECOG) : 1 - Symptomatic but completely ambulatory  Review of Systems  Constitutional: Negative for activity change, appetite change, fatigue and unexpected weight change.  HENT: Negative for mouth sores and trouble swallowing.   Eyes: Negative for pain and visual disturbance.  Respiratory: Negative for cough and shortness of breath.   Cardiovascular: Negative for chest pain and leg swelling.  Gastrointestinal: Negative for abdominal distention, abdominal pain, constipation, diarrhea and nausea.  Endocrine: Negative for cold intolerance and polyuria.  Genitourinary: Negative for decreased urine volume and difficulty urinating.  Musculoskeletal: Negative for arthralgias, gait problem and myalgias.       Left shoulder pain resolved  Skin: Negative for rash and wound.  Neurological: Negative for dizziness, weakness and light-headedness.       Neuropathic like symptoms of right hand  Psychiatric/Behavioral: Negative for sleep disturbance. The patient is not nervous/anxious.    Unless otherwise noted, a complete review of systems is negative.  Physical Exam Exam Limited due to telemedicine  IMPRESSION: I contacted patient and we spoke by phone. Prior to visit I reviewed interval  progress notes, imaging, and labs. At last visit, patient completed DNR and MOST forms.  December imaging was overall stable to slight progression.  CEA was stable. Dr. Rogue Bussing and patient elected to continue FOLFOX plus Avastin chemotherapy. Patient tolerating treatment well with few side effects.   Today he says that he feels well.  He has been experiencing left shoulder pain for the past week and says today the pain has resolved.  He has also had some neuropathy of the right fingers.  Etiology was unclear but thought to be secondary to oxaliplatin versus MSK etiology.  Patient says does not bother him and does not interfere with ADLs.  Appetite and weight are stable.  He denies new pain.  He continues to be the primary caregiver for his wife who has dementia.  He denies any significant concerns  or changes.  No distressing symptoms reported.  Overall he says he is doing well.  PLAN: -Continue current scope of treatment -DNR/DNI -MOST form completed  -Follow-up telephone visit in 4 weeks  I discussed the assessment and treatment plan with the patient. The patient was provided an opportunity to ask questions and all were answered. The patient agreed with the plan and demonstrated an understanding of the instructions.   The patient was advised to call back or seek an in-person evaluation if the symptoms worsen or if the condition fails to improve as anticipated.   I provided 8 minutes of non face-to-face telephone visit time during this encounter, and > 50% was spent counseling as documented under my assessment & plan.   Beckey Rutter, DNP, AGNP-[facility]

## 2019-11-24 ENCOUNTER — Other Ambulatory Visit: Payer: Self-pay

## 2019-11-24 ENCOUNTER — Inpatient Hospital Stay: Payer: Medicare HMO

## 2019-11-24 VITALS — BP 162/81 | HR 71 | Temp 97.8°F | Resp 20

## 2019-11-24 DIAGNOSIS — C182 Malignant neoplasm of ascending colon: Secondary | ICD-10-CM | POA: Diagnosis not present

## 2019-11-24 DIAGNOSIS — C787 Secondary malignant neoplasm of liver and intrahepatic bile duct: Secondary | ICD-10-CM | POA: Diagnosis not present

## 2019-11-24 DIAGNOSIS — Z7189 Other specified counseling: Secondary | ICD-10-CM

## 2019-11-24 DIAGNOSIS — Z5111 Encounter for antineoplastic chemotherapy: Secondary | ICD-10-CM | POA: Diagnosis not present

## 2019-11-24 DIAGNOSIS — Z5112 Encounter for antineoplastic immunotherapy: Secondary | ICD-10-CM | POA: Diagnosis not present

## 2019-11-24 MED ORDER — SODIUM CHLORIDE 0.9% FLUSH
10.0000 mL | INTRAVENOUS | Status: DC | PRN
  Administered 2019-11-24: 10 mL
  Filled 2019-11-24: qty 10

## 2019-11-24 MED ORDER — HEPARIN SOD (PORK) LOCK FLUSH 100 UNIT/ML IV SOLN
INTRAVENOUS | Status: AC
  Filled 2019-11-24: qty 5

## 2019-11-24 MED ORDER — HEPARIN SOD (PORK) LOCK FLUSH 100 UNIT/ML IV SOLN
500.0000 [IU] | Freq: Once | INTRAVENOUS | Status: AC | PRN
  Administered 2019-11-24: 500 [IU]
  Filled 2019-11-24: qty 5

## 2019-12-05 ENCOUNTER — Other Ambulatory Visit: Payer: Self-pay

## 2019-12-06 ENCOUNTER — Other Ambulatory Visit: Payer: Self-pay

## 2019-12-06 ENCOUNTER — Inpatient Hospital Stay: Payer: Medicare HMO | Admitting: Internal Medicine

## 2019-12-06 ENCOUNTER — Encounter: Payer: Self-pay | Admitting: Internal Medicine

## 2019-12-06 ENCOUNTER — Inpatient Hospital Stay: Payer: Medicare HMO

## 2019-12-06 DIAGNOSIS — C182 Malignant neoplasm of ascending colon: Secondary | ICD-10-CM | POA: Diagnosis not present

## 2019-12-06 DIAGNOSIS — C787 Secondary malignant neoplasm of liver and intrahepatic bile duct: Secondary | ICD-10-CM | POA: Diagnosis not present

## 2019-12-06 DIAGNOSIS — Z5112 Encounter for antineoplastic immunotherapy: Secondary | ICD-10-CM | POA: Diagnosis not present

## 2019-12-06 DIAGNOSIS — Z5111 Encounter for antineoplastic chemotherapy: Secondary | ICD-10-CM | POA: Diagnosis not present

## 2019-12-06 DIAGNOSIS — Z7189 Other specified counseling: Secondary | ICD-10-CM

## 2019-12-06 LAB — CBC WITH DIFFERENTIAL/PLATELET
Abs Immature Granulocytes: 0 10*3/uL (ref 0.00–0.07)
Basophils Absolute: 0 10*3/uL (ref 0.0–0.1)
Basophils Relative: 1 %
Eosinophils Absolute: 0.4 10*3/uL (ref 0.0–0.5)
Eosinophils Relative: 9 %
HCT: 39.4 % (ref 39.0–52.0)
Hemoglobin: 13 g/dL (ref 13.0–17.0)
Immature Granulocytes: 0 %
Lymphocytes Relative: 32 %
Lymphs Abs: 1.5 10*3/uL (ref 0.7–4.0)
MCH: 32.7 pg (ref 26.0–34.0)
MCHC: 33 g/dL (ref 30.0–36.0)
MCV: 99.2 fL (ref 80.0–100.0)
Monocytes Absolute: 0.6 10*3/uL (ref 0.1–1.0)
Monocytes Relative: 12 %
Neutro Abs: 2.2 10*3/uL (ref 1.7–7.7)
Neutrophils Relative %: 46 %
Platelets: 117 10*3/uL — ABNORMAL LOW (ref 150–400)
RBC: 3.97 MIL/uL — ABNORMAL LOW (ref 4.22–5.81)
RDW: 13.1 % (ref 11.5–15.5)
WBC: 4.7 10*3/uL (ref 4.0–10.5)
nRBC: 0 % (ref 0.0–0.2)

## 2019-12-06 LAB — URINALYSIS, COMPLETE (UACMP) WITH MICROSCOPIC
Bacteria, UA: NONE SEEN
Bilirubin Urine: NEGATIVE
Glucose, UA: 150 mg/dL — AB
Hgb urine dipstick: NEGATIVE
Ketones, ur: NEGATIVE mg/dL
Leukocytes,Ua: NEGATIVE
Nitrite: NEGATIVE
Protein, ur: NEGATIVE mg/dL
Specific Gravity, Urine: 1.014 (ref 1.005–1.030)
pH: 6 (ref 5.0–8.0)

## 2019-12-06 LAB — COMPREHENSIVE METABOLIC PANEL
ALT: 17 U/L (ref 0–44)
AST: 26 U/L (ref 15–41)
Albumin: 3.5 g/dL (ref 3.5–5.0)
Alkaline Phosphatase: 97 U/L (ref 38–126)
Anion gap: 8 (ref 5–15)
BUN: 12 mg/dL (ref 8–23)
CO2: 24 mmol/L (ref 22–32)
Calcium: 8.5 mg/dL — ABNORMAL LOW (ref 8.9–10.3)
Chloride: 108 mmol/L (ref 98–111)
Creatinine, Ser: 0.7 mg/dL (ref 0.61–1.24)
GFR calc Af Amer: >60 mL/min (ref >60)
GFR calc non Af Amer: >60 mL/min (ref >60)
Glucose, Bld: 191 mg/dL — ABNORMAL HIGH (ref 70–99)
Potassium: 3.6 mmol/L (ref 3.5–5.1)
Sodium: 140 mmol/L (ref 135–145)
Total Bilirubin: 0.5 mg/dL (ref 0.3–1.2)
Total Protein: 6.6 g/dL (ref 6.5–8.1)

## 2019-12-06 MED ORDER — OXALIPLATIN CHEMO INJECTION 100 MG/20ML
85.0000 mg/m2 | Freq: Once | INTRAVENOUS | Status: AC
  Administered 2019-12-06: 11:00:00 180 mg via INTRAVENOUS
  Filled 2019-12-06: qty 36

## 2019-12-06 MED ORDER — DEXTROSE 5 % IV SOLN
Freq: Once | INTRAVENOUS | Status: AC
  Filled 2019-12-06: qty 250

## 2019-12-06 MED ORDER — SODIUM CHLORIDE 0.9% FLUSH
10.0000 mL | Freq: Once | INTRAVENOUS | Status: AC
  Administered 2019-12-06: 08:00:00 10 mL via INTRAVENOUS
  Filled 2019-12-06: qty 10

## 2019-12-06 MED ORDER — SODIUM CHLORIDE 0.9 % IV SOLN
2369.0000 mg/m2 | INTRAVENOUS | Status: DC
  Administered 2019-12-06: 13:00:00 5000 mg via INTRAVENOUS
  Filled 2019-12-06: qty 100

## 2019-12-06 MED ORDER — LEUCOVORIN CALCIUM INJECTION 350 MG
800.0000 mg | Freq: Once | INTRAVENOUS | Status: AC
  Administered 2019-12-06: 11:00:00 800 mg via INTRAVENOUS
  Filled 2019-12-06: qty 25

## 2019-12-06 MED ORDER — PALONOSETRON HCL INJECTION 0.25 MG/5ML
0.2500 mg | Freq: Once | INTRAVENOUS | Status: AC
  Administered 2019-12-06: 10:00:00 0.25 mg via INTRAVENOUS
  Filled 2019-12-06: qty 5

## 2019-12-06 MED ORDER — DEXAMETHASONE SODIUM PHOSPHATE 10 MG/ML IJ SOLN
10.0000 mg | Freq: Once | INTRAMUSCULAR | Status: AC
  Administered 2019-12-06: 10:00:00 10 mg via INTRAVENOUS
  Filled 2019-12-06: qty 1

## 2019-12-06 MED ORDER — SODIUM CHLORIDE 0.9 % IV SOLN
5.0000 mg/kg | Freq: Once | INTRAVENOUS | Status: AC
  Administered 2019-12-06: 400 mg via INTRAVENOUS
  Filled 2019-12-06: qty 16

## 2019-12-06 MED ORDER — SODIUM CHLORIDE 0.9 % IV SOLN
Freq: Once | INTRAVENOUS | Status: AC
  Filled 2019-12-06: qty 250

## 2019-12-06 NOTE — Progress Notes (Signed)
[place] NOTE  Patient Care Team: Oscar Hartigan, MD as PCP - General (Family Medicine) Oscar Server, MD as Consulting Physician (Hematology and Oncology)  CHIEF COMPLAINTS/PURPOSE OF CONSULTATION: Colon cancer  #  Oncology History Overview Note  # MAY 2020- 3. 03/09/19 Liver biopsy. Microscopic examination shows malignant cells with glandular architecture consistent with adenocarcinoma. The malignant cells are positive for CK20 and CDX-2. These findings support the clinical impression of metastatic colon adenocarcinoma. 4. 03/10/19 R hemicolectomy. Tumor site cecum. Adenocarcinoma. Mucinous features present. G2. No tumor deposits. Invades visceral peritoneum. No tumor perforation. LVI present. PNI not identified. All margins uninvolved. 1/12 LNs. PT4apN1. Periappendiceal inflammation c/w resolving abscess. Microsatellite stable (MSS). [Dr.Mettu; [facility]]  # SEP 4th 2020 [compared to May 2020]  Interval increase in size of the metastases to the hepatic dome, The metastasis to the left hepatic lobe is unchanged; 2.  New subcentimeter hypoattenuating lesion in the inferior right hepatic lobe, incompletely characterized on CT. 3.  Postsurgical changes following right hemicolectomy.  # OCT 2020- FOLFOX +avastin; CT dec 22nd 2020- [compared to [facility] sep 9th 2020]-Liver- slight progression versus stable disease.  # NGS/F-ONE-MUTATED K-RAS [G]  # PALLIATIVE CARE EVALUATION: 09/20/2019-Oscar [name]  # PAIN MANAGEMENT: NA   DIAGNOSIS: COLON CANCER  STAGE:  IV     ;  GOALS:Palliative  CURRENT/MOST RECENT THERAPY : FOLFOX+ avastin [C]    Cancer of right colon ([place])  07/05/2019 Initial Diagnosis   Cancer of right colon ([place])   07/24/2019 -  Chemotherapy   The patient had palonosetron (ALOXI) injection 0.25 mg, 0.25 mg, Intravenous,  Once, 10 of 12 cycles Administration: 0.25 mg (07/24/2019), 0.25 mg (08/07/2019), 0.25 mg (08/21/2019), 0.25 mg (09/04/2019), 0.25 mg (09/20/2019), 0.25 mg  (10/04/2019), 0.25 mg (10/16/2019), 0.25 mg (10/30/2019), 0.25 mg (11/22/2019) leucovorin 800 mg in dextrose 5 % 250 mL infusion, 844 mg, Intravenous,  Once, 10 of 12 cycles Administration: 800 mg (07/24/2019), 800 mg (08/07/2019), 800 mg (08/21/2019), 800 mg (09/04/2019), 800 mg (09/20/2019), 800 mg (10/04/2019), 800 mg (10/16/2019), 800 mg (10/30/2019), 800 mg (11/22/2019) oxaliplatin (ELOXATIN) 180 mg in dextrose 5 % 500 mL chemo infusion, 85 mg/m2 = 180 mg, Intravenous,  Once, 10 of 12 cycles Administration: 180 mg (07/24/2019), 180 mg (08/07/2019), 180 mg (08/21/2019), 180 mg (09/04/2019), 180 mg (09/20/2019), 180 mg (10/04/2019), 180 mg (10/16/2019), 180 mg (10/30/2019), 180 mg (11/22/2019) fluorouracil (ADRUCIL) 5,000 mg in sodium chloride 0.9 % 150 mL chemo infusion, 5,050 mg, Intravenous, 1 Day/Dose, 10 of 12 cycles Administration: 5,000 mg (07/24/2019), 5,000 mg (08/07/2019), 5,000 mg (08/21/2019), 5,050 mg (09/04/2019), 5,000 mg (10/04/2019), 5,000 mg (10/16/2019), 5,000 mg (11/22/2019) bevacizumab-bvzr (ZIRABEV) 400 mg in sodium chloride 0.9 % 100 mL chemo infusion, 5 mg/kg = 400 mg, Intravenous,  Once, 10 of 12 cycles Administration: 400 mg (08/07/2019), 400 mg (08/21/2019), 400 mg (09/04/2019), 400 mg (09/20/2019), 400 mg (10/04/2019), 400 mg (10/16/2019), 400 mg (10/30/2019), 400 mg (11/22/2019)  for chemotherapy treatment.      HISTORY OF PRESENTING ILLNESS:  Oscar [name] 79 y.o.  male with metastatic colon cancer to the liver currently on FOLFOX plus Avastin is here for follow-up.  Patient denies any worsening shortness of breath or cough.  Appetite is good.  Chronic mild tingling and numbness in extremities.  No nausea vomiting.  Shoulder pain improved since taking meloxicam.   Review of Systems  Constitutional: Positive for malaise/fatigue. Negative for chills, diaphoresis, fever and weight loss.  HENT: Negative for nosebleeds and sore throat.  Eyes: Negative for double vision.  Respiratory:  Negative for cough, hemoptysis, sputum production, shortness of breath and wheezing.   Cardiovascular: Negative for chest pain, palpitations, orthopnea and leg swelling.  Gastrointestinal: Negative for abdominal pain, blood in stool, constipation, diarrhea, heartburn, melena, nausea and vomiting.  Genitourinary: Negative for dysuria, frequency and urgency.  Musculoskeletal: Positive for back pain and joint pain.  Skin: Negative.  Negative for itching and rash.  Neurological: Positive for tingling. Negative for dizziness, focal weakness, weakness and headaches.  Endo/Heme/Allergies: Does not bruise/bleed easily.  Psychiatric/Behavioral: Negative for depression. The patient is not nervous/anxious and does not have insomnia.      MEDICAL HISTORY:  Past Medical History:  Diagnosis Date  . Arthritis   . Cancer ([place])    colon cancer 02/2019 per pt   . Diabetes mellitus without complication ([place])   . H/O colon cancer, stage IV   . Hyperlipemia   . Hypertension     SURGICAL HISTORY: Past Surgical History:  Procedure Laterality Date  . IR IMAGING GUIDED PORT INSERTION  07/20/2019  . JOINT REPLACEMENT      SOCIAL HISTORY: Social History   Socioeconomic History  . Marital status: Married    Spouse name: Not on file  . Number of children: Not on file  . Years of education: Not on file  . Highest education level: Not on file  Occupational History  . Not on file  Tobacco Use  . Smoking status: Current Every Day Smoker    Packs/day: 0.25    Types: Cigarettes  . Smokeless tobacco: Never Used  . Tobacco comment: 1 to 2 cigarettes a day occasionally  Substance and Sexual Activity  . Alcohol use: No  . Drug use: No  . Sexual activity: Not on file  Other Topics Concern  . Not on file  Social History Narrative   Recruitment consultant retd; lives in [place]; smoking 3cig/day; [3/4 ppd x started at 9 years]; no alcohol. Son & daughter; wife dementia [waiting for placement].     Social Determinants of Health   Financial Resource Strain:   . Difficulty of Paying Living Expenses: Not on file  Food Insecurity:   . Worried About Charity fundraiser in the Last Year: Not on file  . Ran Out of Food in the Last Year: Not on file  Transportation Needs:   . Lack of Transportation (Medical): Not on file  . Lack of Transportation (Non-Medical): Not on file  Physical Activity:   . Days of Exercise per Week: Not on file  . Minutes of Exercise per Session: Not on file  Stress:   . Feeling of Stress : Not on file  Social Connections:   . Frequency of Communication with Friends and Family: Not on file  . Frequency of Social Gatherings with Friends and Family: Not on file  . Attends Religious Services: Not on file  . Active Member of Clubs or Organizations: Not on file  . Attends Archivist Meetings: Not on file  . Marital Status: Not on file  Intimate Partner Violence:   . Fear of Current or Ex-Partner: Not on file  . Emotionally Abused: Not on file  . Physically Abused: Not on file  . Sexually Abused: Not on file    FAMILY HISTORY: Family History  Problem Relation Age of Onset  . Peptic Ulcer Disease Father     ALLERGIES:  is allergic to ace inhibitors.  MEDICATIONS:  Current Outpatient Medications  Medication Sig Dispense  Refill  . amLODipine (NORVASC) 5 MG tablet Take 5 mg by mouth daily.    . fluticasone (FLONASE) 50 MCG/ACT nasal spray Place 1 spray into both nostrils daily as needed.     . meloxicam (MOBIC) 15 MG tablet Take 15 mg by mouth daily.    . pravastatin (PRAVACHOL) 40 MG tablet Take 40 mg by mouth daily.    . tamsulosin (FLOMAX) 0.4 MG CAPS capsule Take 1 capsule by mouth daily.    Marland Kitchen lidocaine-prilocaine (EMLA) cream Apply 1 application topically as needed. (Patient not taking: Reported on 12/05/2019) 30 g 0  . ondansetron (ZOFRAN-ODT) 4 MG disintegrating tablet Take 1 tablet (4 mg total) by mouth every 6 (six) hours as needed for  nausea. (Patient not taking: Reported on 12/05/2019) 20 tablet 0  . prochlorperazine (COMPAZINE) 10 MG tablet Take 1 tablet (10 mg total) by mouth every 6 (six) hours as needed for nausea or vomiting. (Patient not taking: Reported on 12/05/2019) 40 tablet 1   No current facility-administered medications for this visit.   Facility-Administered Medications Ordered in Other Visits  Medication Dose Route Frequency Provider Last Rate Last Admin  . fluorouracil (ADRUCIL) 5,000 mg in sodium chloride 0.9 % 150 mL chemo infusion  2,369 mg/m2 (Treatment Plan Recorded) Intravenous 1 day or 1 dose Cammie Sickle, MD          .  PHYSICAL EXAMINATION: ECOG PERFORMANCE STATUS: 0 - Asymptomatic  Vitals:   12/06/19 0938  BP: (!) 146/83  Pulse: 73  Temp: (!) 97.3 F (36.3 C)   Filed Weights   12/06/19 0938  Weight: 188 lb (85.3 kg)    Physical Exam  Constitutional: He is oriented to person, place, and time and well-developed, well-nourished, and in no distress.  HENT:  Head: Normocephalic and atraumatic.  Mouth/Throat: Oropharynx is clear and moist. No oropharyngeal exudate.  Eyes: Pupils are equal, round, and reactive to light.  Cardiovascular: Normal rate and regular rhythm.  Pulmonary/Chest: No respiratory distress. He has no wheezes.  Abdominal: Soft. Bowel sounds are normal. He exhibits no distension and no mass. There is no abdominal tenderness. There is no rebound and no guarding.  Musculoskeletal:        General: No tenderness or edema. Normal range of motion.     Cervical back: Normal range of motion and neck supple.  Neurological: He is alert and oriented to person, place, and time.  Skin: Skin is warm.  Psychiatric: Affect normal.   LABORATORY DATA:  I have reviewed the data as listed Lab Results  Component Value Date   WBC 4.7 12/06/2019   HGB 13.0 12/06/2019   HCT 39.4 12/06/2019   MCV 99.2 12/06/2019   PLT 117 (L) 12/06/2019   Recent Labs    10/30/19 0834  11/22/19 0819 12/06/19 0816  NA 139 137 140  K 4.0 3.7 3.6  CL 109 106 108  CO2 _0 GLUCOSE 114* 186* 191*  BUN _1 CREATININE 0.67 0.80 0.70  CALCIUM 8.7* 8.6* 8.5*  GFRNONAA >60 >60 >60  GFRAA >60 >60 >60  PROT 6.6 6.4* 6.6  ALBUMIN 3.4* 3.3* 3.5  AST _2 ALT _3 ALKPHOS 97 93 97  BILITOT 0.5 0.5 0.5    RADIOGRAPHIC STUDIES: I have personally reviewed the radiological images as listed and agreed with the findings in the report. No results found.  ASSESSMENT & PLAN:   Cancer of right colon ([place]) #Right-sided colon  adenocarcinoma-with synchronous metastasis to liver/unresectable.  Currently on FOLFOX plus Avastin; DEC 22nd- CT [compared to Sep 4th CT-[facility]]- overall stable; slight progression. STABLE; slightly rising CEA- 18.    #Proceed with today- cycle 9 of FOLFOX plus Avastin chemotherapy.  Labs today reviewed;  acceptable for treatment today. Will repeat scans in march mid; will order at next visit.   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 STABLE. Monitor for now.  # HTN- systolic 062B stable; Improved; monitor closely.  # left shoulder pain- ? Tear- on meloxicam- stable; monitor for now.   # DISPOSITION: #FOLFOX+ Avastin today # follow up in 2 weeks- MD; labs- cbc/cmp/UA/ CEA; FOLFOX + avastin;pump off in 2 days- Dr.B  All questions were answered. The patient knows to call the clinic with any problems, questions or concerns.    Cammie Sickle, MD 12/06/2019 1:15 PM

## 2019-12-06 NOTE — Assessment & Plan Note (Addendum)
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  Currently on FOLFOX plus Avastin; DEC 22nd- CT [compared to Sep 4th CT-[facility]]- overall stable; slight progression. STABLE; slightly rising CEA- 18.    #Proceed with today- cycle 9 of FOLFOX plus Avastin chemotherapy.  Labs today reviewed;  acceptable for treatment today. Will repeat scans in march mid; will order at next visit.   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 STABLE. Monitor for now.  # HTN- systolic 0000000 stable; Improved; monitor closely.  # left shoulder pain- ? Tear- on meloxicam- stable; monitor for now.   # DISPOSITION: #FOLFOX+ Avastin today # follow up in 2 weeks- MD; labs- cbc/cmp/UA/ CEA; FOLFOX + avastin;pump off in 2 days- Dr.B

## 2019-12-07 LAB — CEA: CEA: 20 ng/mL — ABNORMAL HIGH (ref 0.0–4.7)

## 2019-12-08 ENCOUNTER — Inpatient Hospital Stay: Payer: Medicare HMO

## 2019-12-08 ENCOUNTER — Other Ambulatory Visit: Payer: Self-pay

## 2019-12-08 VITALS — BP 151/82 | HR 72 | Temp 98.0°F | Resp 18

## 2019-12-08 DIAGNOSIS — Z5111 Encounter for antineoplastic chemotherapy: Secondary | ICD-10-CM | POA: Diagnosis not present

## 2019-12-08 DIAGNOSIS — C787 Secondary malignant neoplasm of liver and intrahepatic bile duct: Secondary | ICD-10-CM | POA: Diagnosis not present

## 2019-12-08 DIAGNOSIS — C182 Malignant neoplasm of ascending colon: Secondary | ICD-10-CM

## 2019-12-08 DIAGNOSIS — Z7189 Other specified counseling: Secondary | ICD-10-CM

## 2019-12-08 DIAGNOSIS — Z5112 Encounter for antineoplastic immunotherapy: Secondary | ICD-10-CM | POA: Diagnosis not present

## 2019-12-08 MED ORDER — SODIUM CHLORIDE 0.9% FLUSH
10.0000 mL | INTRAVENOUS | Status: DC | PRN
  Administered 2019-12-08: 13:00:00 10 mL
  Filled 2019-12-08: qty 10

## 2019-12-08 MED ORDER — HEPARIN SOD (PORK) LOCK FLUSH 100 UNIT/ML IV SOLN
500.0000 [IU] | Freq: Once | INTRAVENOUS | Status: AC | PRN
  Administered 2019-12-08: 500 [IU]
  Filled 2019-12-08: qty 5

## 2019-12-20 ENCOUNTER — Inpatient Hospital Stay: Payer: Medicare HMO | Attending: Internal Medicine

## 2019-12-20 ENCOUNTER — Encounter: Payer: Self-pay | Admitting: Internal Medicine

## 2019-12-20 ENCOUNTER — Inpatient Hospital Stay (HOSPITAL_BASED_OUTPATIENT_CLINIC_OR_DEPARTMENT_OTHER): Payer: Medicare HMO | Admitting: Internal Medicine

## 2019-12-20 ENCOUNTER — Other Ambulatory Visit: Payer: Self-pay

## 2019-12-20 VITALS — Resp 18

## 2019-12-20 DIAGNOSIS — Z5112 Encounter for antineoplastic immunotherapy: Secondary | ICD-10-CM | POA: Diagnosis not present

## 2019-12-20 DIAGNOSIS — G620 Drug-induced polyneuropathy: Secondary | ICD-10-CM | POA: Insufficient documentation

## 2019-12-20 DIAGNOSIS — Z5111 Encounter for antineoplastic chemotherapy: Secondary | ICD-10-CM | POA: Insufficient documentation

## 2019-12-20 DIAGNOSIS — C787 Secondary malignant neoplasm of liver and intrahepatic bile duct: Secondary | ICD-10-CM | POA: Diagnosis not present

## 2019-12-20 DIAGNOSIS — F17210 Nicotine dependence, cigarettes, uncomplicated: Secondary | ICD-10-CM | POA: Diagnosis not present

## 2019-12-20 DIAGNOSIS — C182 Malignant neoplasm of ascending colon: Secondary | ICD-10-CM | POA: Diagnosis not present

## 2019-12-20 DIAGNOSIS — Z452 Encounter for adjustment and management of vascular access device: Secondary | ICD-10-CM | POA: Insufficient documentation

## 2019-12-20 DIAGNOSIS — Z7189 Other specified counseling: Secondary | ICD-10-CM

## 2019-12-20 DIAGNOSIS — I10 Essential (primary) hypertension: Secondary | ICD-10-CM | POA: Diagnosis not present

## 2019-12-20 LAB — CBC WITH DIFFERENTIAL/PLATELET
Abs Immature Granulocytes: 0.01 10*3/uL (ref 0.00–0.07)
Basophils Absolute: 0 10*3/uL (ref 0.0–0.1)
Basophils Relative: 1 %
Eosinophils Absolute: 0.4 10*3/uL (ref 0.0–0.5)
Eosinophils Relative: 9 %
HCT: 40.3 % (ref 39.0–52.0)
Hemoglobin: 13 g/dL (ref 13.0–17.0)
Immature Granulocytes: 0 %
Lymphocytes Relative: 29 %
Lymphs Abs: 1.4 10*3/uL (ref 0.7–4.0)
MCH: 32.2 pg (ref 26.0–34.0)
MCHC: 32.3 g/dL (ref 30.0–36.0)
MCV: 99.8 fL (ref 80.0–100.0)
Monocytes Absolute: 0.6 10*3/uL (ref 0.1–1.0)
Monocytes Relative: 14 %
Neutro Abs: 2.3 10*3/uL (ref 1.7–7.7)
Neutrophils Relative %: 47 %
Platelets: 125 10*3/uL — ABNORMAL LOW (ref 150–400)
RBC: 4.04 MIL/uL — ABNORMAL LOW (ref 4.22–5.81)
RDW: 13.3 % (ref 11.5–15.5)
WBC: 4.7 10*3/uL (ref 4.0–10.5)
nRBC: 0 % (ref 0.0–0.2)

## 2019-12-20 LAB — URINALYSIS, COMPLETE (UACMP) WITH MICROSCOPIC
Glucose, UA: NEGATIVE mg/dL
Hgb urine dipstick: NEGATIVE
Ketones, ur: NEGATIVE mg/dL
Leukocytes,Ua: NEGATIVE
Nitrite: NEGATIVE
Protein, ur: 30 mg/dL — AB
Specific Gravity, Urine: 1.029 (ref 1.005–1.030)
pH: 5 (ref 5.0–8.0)

## 2019-12-20 LAB — COMPREHENSIVE METABOLIC PANEL
ALT: 18 U/L (ref 0–44)
AST: 26 U/L (ref 15–41)
Albumin: 3.3 g/dL — ABNORMAL LOW (ref 3.5–5.0)
Alkaline Phosphatase: 106 U/L (ref 38–126)
Anion gap: 6 (ref 5–15)
BUN: 11 mg/dL (ref 8–23)
CO2: 24 mmol/L (ref 22–32)
Calcium: 8.5 mg/dL — ABNORMAL LOW (ref 8.9–10.3)
Chloride: 110 mmol/L (ref 98–111)
Creatinine, Ser: 0.82 mg/dL (ref 0.61–1.24)
GFR calc Af Amer: >60 mL/min (ref >60)
GFR calc non Af Amer: >60 mL/min (ref >60)
Glucose, Bld: 155 mg/dL — ABNORMAL HIGH (ref 70–99)
Potassium: 3.7 mmol/L (ref 3.5–5.1)
Sodium: 140 mmol/L (ref 135–145)
Total Bilirubin: 0.5 mg/dL (ref 0.3–1.2)
Total Protein: 6.7 g/dL (ref 6.5–8.1)

## 2019-12-20 MED ORDER — LEUCOVORIN CALCIUM INJECTION 350 MG
379.0000 mg/m2 | Freq: Once | INTRAVENOUS | Status: AC
  Administered 2019-12-20: 800 mg via INTRAVENOUS
  Filled 2019-12-20: qty 40

## 2019-12-20 MED ORDER — SODIUM CHLORIDE 0.9 % IV SOLN
5.0000 mg/kg | Freq: Once | INTRAVENOUS | Status: AC
  Administered 2019-12-20: 400 mg via INTRAVENOUS
  Filled 2019-12-20: qty 16

## 2019-12-20 MED ORDER — DEXTROSE 5 % IV SOLN
Freq: Once | INTRAVENOUS | Status: AC
  Filled 2019-12-20: qty 250

## 2019-12-20 MED ORDER — SODIUM CHLORIDE 0.9% FLUSH
10.0000 mL | INTRAVENOUS | Status: DC | PRN
  Administered 2019-12-20: 14:00:00 10 mL
  Filled 2019-12-20: qty 10

## 2019-12-20 MED ORDER — PALONOSETRON HCL INJECTION 0.25 MG/5ML
0.2500 mg | Freq: Once | INTRAVENOUS | Status: AC
  Administered 2019-12-20: 0.25 mg via INTRAVENOUS
  Filled 2019-12-20: qty 5

## 2019-12-20 MED ORDER — SODIUM CHLORIDE 0.9 % IV SOLN
Freq: Once | INTRAVENOUS | Status: AC
  Filled 2019-12-20: qty 250

## 2019-12-20 MED ORDER — SODIUM CHLORIDE 0.9% FLUSH
10.0000 mL | INTRAVENOUS | Status: DC | PRN
  Administered 2019-12-20: 10 mL via INTRAVENOUS
  Filled 2019-12-20: qty 10

## 2019-12-20 MED ORDER — OXALIPLATIN CHEMO INJECTION 100 MG/20ML
85.0000 mg/m2 | Freq: Once | INTRAVENOUS | Status: AC
  Administered 2019-12-20: 180 mg via INTRAVENOUS
  Filled 2019-12-20: qty 36

## 2019-12-20 MED ORDER — SODIUM CHLORIDE 0.9 % IV SOLN
5000.0000 mg | INTRAVENOUS | Status: DC
  Administered 2019-12-20: 5000 mg via INTRAVENOUS
  Filled 2019-12-20: qty 100

## 2019-12-20 MED ORDER — DEXAMETHASONE SODIUM PHOSPHATE 10 MG/ML IJ SOLN
10.0000 mg | Freq: Once | INTRAMUSCULAR | Status: AC
  Administered 2019-12-20: 10 mg via INTRAVENOUS
  Filled 2019-12-20: qty 1

## 2019-12-20 NOTE — Assessment & Plan Note (Addendum)
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  Currently on FOLFOX plus Avastin; DEC 22nd- CT [compared to Sep 4th CT-[facility]]- overall stable; slight progression. stable slightly rising CEA- 18.    #Proceed with today- cycle 11  of FOLFOX plus Avastin chemotherapy.  Labs today reviewed;  acceptable for treatment today. CT scan orered  # PN- G-1 sec to oxaliplatin. Stable.  Monitor for now.  # HTN- systolic 0000000 stable. Improved; monitor closely.  # left shoulder pain- ? Tear- on meloxicam- stable;    # DISPOSITION: #FOLFOX+ Avastin today # follow up in 2 weeks- MD; labs- cbc/cmp/UA/ CEA; FOLFOX + avastin;pump off in 2 days- Dr.B

## 2019-12-20 NOTE — Progress Notes (Signed)
[facility] NOTE  Patient Care Team: Oscar Hartigan, MD as PCP - General (Family Medicine) Oscar Server, MD as Consulting Physician (Hematology and Oncology)  CHIEF COMPLAINTS/PURPOSE OF CONSULTATION: Colon cancer  #  Oncology History Overview Note  # MAY 2020- 3. 03/09/19 Liver biopsy. Microscopic examination shows malignant cells with glandular architecture consistent with adenocarcinoma. The malignant cells are positive for CK20 and CDX-2. These findings support the clinical impression of metastatic colon adenocarcinoma. 4. 03/10/19 R hemicolectomy. Tumor site cecum. Adenocarcinoma. Mucinous features present. G2. No tumor deposits. Invades visceral peritoneum. No tumor perforation. LVI present. PNI not identified. All margins uninvolved. 1/12 LNs. PT4apN1. Periappendiceal inflammation c/w resolving abscess. Microsatellite stable (MSS). [Dr.Mettu; [facility]]  # SEP 4th 2020 [compared to May 2020]  Interval increase in size of the metastases to the hepatic dome, The metastasis to the left hepatic lobe is unchanged; 2.  New subcentimeter hypoattenuating lesion in the inferior right hepatic lobe, incompletely characterized on CT. 3.  Postsurgical changes following right hemicolectomy.  # OCT 2020- FOLFOX +avastin; CT dec 22nd 2020- [compared to [facility] sep 9th 2020]-Liver- slight progression versus stable disease.  # NGS/F-ONE-MUTATED K-RAS [G]  # PALLIATIVE CARE EVALUATION: 09/20/2019-Oscar [name]  # PAIN MANAGEMENT: NA   DIAGNOSIS: COLON CANCER  STAGE:  IV     ;  GOALS:Palliative  CURRENT/MOST RECENT THERAPY : FOLFOX+ avastin [C]    Cancer of right colon ([place])  07/05/2019 Initial Diagnosis   Cancer of right colon ([place])   07/24/2019 -  Chemotherapy   The patient had palonosetron (ALOXI) injection 0.25 mg, 0.25 mg, Intravenous,  Once, 11 of 12 cycles Administration: 0.25 mg (07/24/2019), 0.25 mg (08/07/2019), 0.25 mg (08/21/2019), 0.25 mg (09/04/2019), 0.25 mg (09/20/2019), 0.25 mg  (10/04/2019), 0.25 mg (10/16/2019), 0.25 mg (10/30/2019), 0.25 mg (11/22/2019), 0.25 mg (12/06/2019) leucovorin 800 mg in dextrose 5 % 250 mL infusion, 844 mg, Intravenous,  Once, 11 of 12 cycles Administration: 800 mg (07/24/2019), 800 mg (08/07/2019), 800 mg (08/21/2019), 800 mg (09/04/2019), 800 mg (09/20/2019), 800 mg (10/04/2019), 800 mg (10/16/2019), 800 mg (10/30/2019), 800 mg (11/22/2019), 800 mg (12/06/2019) oxaliplatin (ELOXATIN) 180 mg in dextrose 5 % 500 mL chemo infusion, 85 mg/m2 = 180 mg, Intravenous,  Once, 11 of 12 cycles Administration: 180 mg (07/24/2019), 180 mg (08/07/2019), 180 mg (08/21/2019), 180 mg (09/04/2019), 180 mg (09/20/2019), 180 mg (10/04/2019), 180 mg (10/16/2019), 180 mg (10/30/2019), 180 mg (11/22/2019), 180 mg (12/06/2019) fluorouracil (ADRUCIL) 5,000 mg in sodium chloride 0.9 % 150 mL chemo infusion, 5,050 mg, Intravenous, 1 Day/Dose, 11 of 12 cycles Administration: 5,000 mg (07/24/2019), 5,000 mg (08/07/2019), 5,000 mg (08/21/2019), 5,050 mg (09/04/2019), 5,000 mg (10/04/2019), 5,000 mg (10/16/2019), 5,000 mg (11/22/2019), 5,000 mg (12/06/2019) bevacizumab-bvzr (ZIRABEV) 400 mg in sodium chloride 0.9 % 100 mL chemo infusion, 5 mg/kg = 400 mg, Intravenous,  Once, 11 of 12 cycles Administration: 400 mg (08/07/2019), 400 mg (08/21/2019), 400 mg (09/04/2019), 400 mg (09/20/2019), 400 mg (10/04/2019), 400 mg (10/16/2019), 400 mg (10/30/2019), 400 mg (11/22/2019), 400 mg (12/06/2019)  for chemotherapy treatment.      HISTORY OF PRESENTING ILLNESS:  Oscar [name] 79 y.o.  male with metastatic colon cancer to the liver currently on FOLFOX plus Avastin is here for follow-up.  Patient denies any blood in stools or black-colored stools.  Denies any nausea vomiting.  Appetite is fair.  No weight loss.  Chronic mild tingling and numbness.  Continues to have left shoulder pain intermittent.  Not any worse   Review of  Systems  Constitutional: Positive for malaise/fatigue. Negative for chills,  diaphoresis, fever and weight loss.  HENT: Negative for nosebleeds and sore throat.   Eyes: Negative for double vision.  Respiratory: Negative for cough, hemoptysis, sputum production, shortness of breath and wheezing.   Cardiovascular: Negative for chest pain, palpitations, orthopnea and leg swelling.  Gastrointestinal: Negative for abdominal pain, blood in stool, constipation, diarrhea, heartburn, melena, nausea and vomiting.  Genitourinary: Negative for dysuria, frequency and urgency.  Musculoskeletal: Positive for back pain and joint pain.  Skin: Negative.  Negative for itching and rash.  Neurological: Positive for tingling. Negative for dizziness, focal weakness, weakness and headaches.  Endo/Heme/Allergies: Does not bruise/bleed easily.  Psychiatric/Behavioral: Negative for depression. The patient is not nervous/anxious and does not have insomnia.      MEDICAL HISTORY:  Past Medical History:  Diagnosis Date  . Arthritis   . Cancer ([place])    colon cancer 02/2019 per pt   . Diabetes mellitus without complication ([place])   . H/O colon cancer, stage IV   . Hyperlipemia   . Hypertension     SURGICAL HISTORY: Past Surgical History:  Procedure Laterality Date  . IR IMAGING GUIDED PORT INSERTION  07/20/2019  . JOINT REPLACEMENT      SOCIAL HISTORY: Social History   Socioeconomic History  . Marital status: Married    Spouse name: Not on file  . Number of children: Not on file  . Years of education: Not on file  . Highest education level: Not on file  Occupational History  . Not on file  Tobacco Use  . Smoking status: Current Every Day Smoker    Packs/day: 0.25    Types: Cigarettes  . Smokeless tobacco: Never Used  . Tobacco comment: 1 to 2 cigarettes a day occasionally  Substance and Sexual Activity  . Alcohol use: No  . Drug use: No  . Sexual activity: Not on file  Other Topics Concern  . Not on file  Social History Narrative   Recruitment consultant retd; lives in  [place]; smoking 3cig/day; [3/4 ppd x started at 9 years]; no alcohol. Son & daughter; wife dementia [waiting for placement].    Social Determinants of Health   Financial Resource Strain:   . Difficulty of Paying Living Expenses: Not on file  Food Insecurity:   . Worried About Charity fundraiser in the Last Year: Not on file  . Ran Out of Food in the Last Year: Not on file  Transportation Needs:   . Lack of Transportation (Medical): Not on file  . Lack of Transportation (Non-Medical): Not on file  Physical Activity:   . Days of Exercise per Week: Not on file  . Minutes of Exercise per Session: Not on file  Stress:   . Feeling of Stress : Not on file  Social Connections:   . Frequency of Communication with Friends and Family: Not on file  . Frequency of Social Gatherings with Friends and Family: Not on file  . Attends Religious Services: Not on file  . Active Member of Clubs or Organizations: Not on file  . Attends Archivist Meetings: Not on file  . Marital Status: Not on file  Intimate Partner Violence:   . Fear of Current or Ex-Partner: Not on file  . Emotionally Abused: Not on file  . Physically Abused: Not on file  . Sexually Abused: Not on file    FAMILY HISTORY: Family History  Problem Relation Age of Onset  . Peptic  Ulcer Disease Father     ALLERGIES:  is allergic to ace inhibitors.  MEDICATIONS:  Current Outpatient Medications  Medication Sig Dispense Refill  . amLODipine (NORVASC) 5 MG tablet Take 5 mg by mouth daily.    . fluticasone (FLONASE) 50 MCG/ACT nasal spray Place 1 spray into both nostrils daily as needed.     . meloxicam (MOBIC) 15 MG tablet Take 15 mg by mouth daily.    . pravastatin (PRAVACHOL) 40 MG tablet Take 40 mg by mouth daily.    . tamsulosin (FLOMAX) 0.4 MG CAPS capsule Take 1 capsule by mouth daily.    Marland Kitchen lidocaine-prilocaine (EMLA) cream Apply 1 application topically as needed. (Patient not taking: Reported on  12/05/2019) 30 g 0  . ondansetron (ZOFRAN-ODT) 4 MG disintegrating tablet Take 1 tablet (4 mg total) by mouth every 6 (six) hours as needed for nausea. (Patient not taking: Reported on 12/05/2019) 20 tablet 0  . prochlorperazine (COMPAZINE) 10 MG tablet Take 1 tablet (10 mg total) by mouth every 6 (six) hours as needed for nausea or vomiting. (Patient not taking: Reported on 12/05/2019) 40 tablet 1   No current facility-administered medications for this visit.   Facility-Administered Medications Ordered in Other Visits  Medication Dose Route Frequency Provider Last Rate Last Admin  . fluorouracil (ADRUCIL) 5,000 mg in sodium chloride 0.9 % 150 mL chemo infusion  5,000 mg Intravenous 1 day or 1 dose Charlaine Dalton R, MD      . leucovorin 800 mg in dextrose 5 % 250 mL infusion  379 mg/m2 (Treatment Plan Recorded) Intravenous Once Cammie Sickle, MD 145 mL/hr at 12/20/19 1147 800 mg at 12/20/19 1147  . oxaliplatin (ELOXATIN) 180 mg in dextrose 5 % 500 mL chemo infusion  85 mg/m2 (Treatment Plan Recorded) Intravenous Once Cammie Sickle, MD 268 mL/hr at 12/20/19 1146 180 mg at 12/20/19 1146  . sodium chloride flush (NS) 0.9 % injection 10 mL  10 mL Intravenous PRN Cammie Sickle, MD   10 mL at 12/20/19 0849      .  PHYSICAL EXAMINATION: ECOG PERFORMANCE STATUS: 0 - Asymptomatic  Vitals:   12/20/19 0919  BP: (!) 152/80  Pulse: 71  Temp: (!) 97.5 F (36.4 C)   Filed Weights   12/20/19 0919  Weight: 187 lb 4 oz (84.9 kg)    Physical Exam  Constitutional: He is oriented to person, place, and time and well-developed, well-nourished, and in no distress.  HENT:  Head: Normocephalic and atraumatic.  Mouth/Throat: Oropharynx is clear and moist. No oropharyngeal exudate.  Eyes: Pupils are equal, round, and reactive to light.  Cardiovascular: Normal rate and regular rhythm.  Pulmonary/Chest: No respiratory distress. He has no wheezes.  Abdominal: Soft. Bowel sounds are  normal. He exhibits no distension and no mass. There is no abdominal tenderness. There is no rebound and no guarding.  Musculoskeletal:        General: No tenderness or edema. Normal range of motion.     Cervical back: Normal range of motion and neck supple.  Neurological: He is alert and oriented to person, place, and time.  Skin: Skin is warm.  Psychiatric: Affect normal.   LABORATORY DATA:  I have reviewed the data as listed Lab Results  Component Value Date   WBC 4.7 12/20/2019   HGB 13.0 12/20/2019   HCT 40.3 12/20/2019   MCV 99.8 12/20/2019   PLT 125 (L) 12/20/2019   Recent Labs    11/22/19 0819 12/06/19 7681  12/20/19 0849  NA 137 140 140  K 3.7 3.6 3.7  CL 106 108 110  CO2 _0 GLUCOSE 186* 191* 155*  BUN _1 CREATININE 0.80 0.70 0.82  CALCIUM 8.6* 8.5* 8.5*  GFRNONAA >60 >60 >60  GFRAA >60 >60 >60  PROT 6.4* 6.6 6.7  ALBUMIN 3.3* 3.5 3.3*  AST _2 ALT _3 ALKPHOS 93 97 106  BILITOT 0.5 0.5 0.5    RADIOGRAPHIC STUDIES: I have personally reviewed the radiological images as listed and agreed with the findings in the report. No results found.  ASSESSMENT & PLAN:   Cancer of right colon ([place]) #Right-sided colon adenocarcinoma-with synchronous metastasis to liver/unresectable.  Currently on FOLFOX plus Avastin; DEC 22nd- CT [compared to Sep 4th CT-[facility]]- overall stable; slight progression. stable slightly rising CEA- 18.    #Proceed with today- cycle 11  of FOLFOX plus Avastin chemotherapy.  Labs today reviewed;  acceptable for treatment today. CT scan orered  # PN- G-1 sec to oxaliplatin. Stable.  Monitor for now.  # HTN- systolic 681C stable. Improved; monitor closely.  # left shoulder pain- ? Tear- on meloxicam- stable;    # DISPOSITION: #FOLFOX+ Avastin today # follow up in 2 weeks- MD; labs- cbc/cmp/UA/ CEA; FOLFOX + avastin;pump off in 2 days- Dr.B  All questions were answered. The patient knows to call the clinic with  any problems, questions or concerns.    Cammie Sickle, MD 12/20/2019 12:35 PM

## 2019-12-21 ENCOUNTER — Inpatient Hospital Stay (HOSPITAL_BASED_OUTPATIENT_CLINIC_OR_DEPARTMENT_OTHER): Payer: Medicare HMO | Admitting: Hospice and Palliative Medicine

## 2019-12-21 ENCOUNTER — Other Ambulatory Visit: Payer: Self-pay | Admitting: Hospice and Palliative Medicine

## 2019-12-21 DIAGNOSIS — Z515 Encounter for palliative care: Secondary | ICD-10-CM | POA: Diagnosis not present

## 2019-12-21 DIAGNOSIS — G893 Neoplasm related pain (acute) (chronic): Secondary | ICD-10-CM | POA: Diagnosis not present

## 2019-12-21 DIAGNOSIS — C182 Malignant neoplasm of ascending colon: Secondary | ICD-10-CM | POA: Diagnosis not present

## 2019-12-21 LAB — CEA: CEA: 17.5 ng/mL — ABNORMAL HIGH (ref 0.0–4.7)

## 2019-12-21 MED ORDER — MELOXICAM 15 MG PO TABS: 15.0000 mg | ORAL_TABLET | Freq: Every day | ORAL | 0 refills | Status: DC

## 2019-12-21 NOTE — Progress Notes (Signed)
Virtual Visit via Telephone Note  I connected with Oscar [name] on 12/21/19 at  2:30 PM EST by telephone and verified that I am speaking with the correct person using two identifiers.   I discussed the limitations, risks, security and privacy concerns of performing an evaluation and management service by telephone and the availability of in person appointments. I also discussed with the patient that there may be a patient responsible charge related to this service. The patient expressed understanding and agreed to proceed.   History of Present Illness: [name] is a 79 year old man with multiple medical problems including stage IV colorectal cancer currently on systemic treatment with FOLFOX.   Observations/Objective: I called and spoke with patient by phone.  He reports that he is doing well.  He denies any significant changes or concerns.  No symptomatic complaints today.  Patient says that he still playing golf regularly.    Patient did request a refill of his Mobic, which he takes daily and has for years.  He says that it has been prescribed by Dr. Ellison Hughs but he has not seen him in almost a year. Will provide interim refill until patient can be seen by PCP. Would recommend addition of PPI in setting of chronic NSAID  Assessment and Plan: Stage IV colorectal cancer -on treatment with FOLFOX.  Patient is followed by Dr. Rogue Bussing.  Tolerating things well.  Will follow  Follow Up Instructions: Follow-up telephone visit with me in 4 weeks   I discussed the assessment and treatment plan with the patient. The patient was provided an opportunity to ask questions and all were answered. The patient agreed with the plan and demonstrated an understanding of the instructions.   The patient was advised to call back or seek an in-person evaluation if the symptoms worsen or if the condition fails to improve as anticipated.  I provided 5 minutes of non-face-to-face time during this  encounter.   [name], NP

## 2019-12-22 ENCOUNTER — Inpatient Hospital Stay: Payer: Medicare HMO

## 2019-12-22 ENCOUNTER — Other Ambulatory Visit: Payer: Self-pay

## 2019-12-22 DIAGNOSIS — Z452 Encounter for adjustment and management of vascular access device: Secondary | ICD-10-CM | POA: Diagnosis not present

## 2019-12-22 DIAGNOSIS — C787 Secondary malignant neoplasm of liver and intrahepatic bile duct: Secondary | ICD-10-CM | POA: Diagnosis not present

## 2019-12-22 DIAGNOSIS — C182 Malignant neoplasm of ascending colon: Secondary | ICD-10-CM

## 2019-12-22 DIAGNOSIS — F17210 Nicotine dependence, cigarettes, uncomplicated: Secondary | ICD-10-CM | POA: Diagnosis not present

## 2019-12-22 DIAGNOSIS — Z7189 Other specified counseling: Secondary | ICD-10-CM

## 2019-12-22 DIAGNOSIS — Z5112 Encounter for antineoplastic immunotherapy: Secondary | ICD-10-CM | POA: Diagnosis not present

## 2019-12-22 DIAGNOSIS — Z5111 Encounter for antineoplastic chemotherapy: Secondary | ICD-10-CM | POA: Diagnosis not present

## 2019-12-22 DIAGNOSIS — I10 Essential (primary) hypertension: Secondary | ICD-10-CM | POA: Diagnosis not present

## 2019-12-22 DIAGNOSIS — G620 Drug-induced polyneuropathy: Secondary | ICD-10-CM | POA: Diagnosis not present

## 2019-12-22 MED ORDER — HEPARIN SOD (PORK) LOCK FLUSH 100 UNIT/ML IV SOLN
500.0000 [IU] | Freq: Once | INTRAVENOUS | Status: AC | PRN
  Administered 2019-12-22: 500 [IU]
  Filled 2019-12-22: qty 5

## 2019-12-22 MED ORDER — SODIUM CHLORIDE 0.9% FLUSH
10.0000 mL | INTRAVENOUS | Status: DC | PRN
  Administered 2019-12-22: 10 mL
  Filled 2019-12-22: qty 10

## 2020-01-02 ENCOUNTER — Encounter: Payer: Self-pay | Admitting: Internal Medicine

## 2020-01-02 ENCOUNTER — Other Ambulatory Visit: Payer: Self-pay

## 2020-01-02 DIAGNOSIS — C182 Malignant neoplasm of ascending colon: Secondary | ICD-10-CM | POA: Diagnosis not present

## 2020-01-03 ENCOUNTER — Encounter: Payer: Self-pay | Admitting: Internal Medicine

## 2020-01-03 ENCOUNTER — Inpatient Hospital Stay: Payer: Medicare HMO

## 2020-01-03 ENCOUNTER — Inpatient Hospital Stay (HOSPITAL_BASED_OUTPATIENT_CLINIC_OR_DEPARTMENT_OTHER): Payer: Medicare HMO | Admitting: Internal Medicine

## 2020-01-03 VITALS — BP 158/88 | HR 64

## 2020-01-03 DIAGNOSIS — C182 Malignant neoplasm of ascending colon: Secondary | ICD-10-CM

## 2020-01-03 DIAGNOSIS — Z452 Encounter for adjustment and management of vascular access device: Secondary | ICD-10-CM | POA: Diagnosis not present

## 2020-01-03 DIAGNOSIS — I10 Essential (primary) hypertension: Secondary | ICD-10-CM | POA: Diagnosis not present

## 2020-01-03 DIAGNOSIS — Z7189 Other specified counseling: Secondary | ICD-10-CM

## 2020-01-03 DIAGNOSIS — G620 Drug-induced polyneuropathy: Secondary | ICD-10-CM | POA: Diagnosis not present

## 2020-01-03 DIAGNOSIS — Z95828 Presence of other vascular implants and grafts: Secondary | ICD-10-CM

## 2020-01-03 DIAGNOSIS — F17210 Nicotine dependence, cigarettes, uncomplicated: Secondary | ICD-10-CM | POA: Diagnosis not present

## 2020-01-03 DIAGNOSIS — Z5112 Encounter for antineoplastic immunotherapy: Secondary | ICD-10-CM | POA: Diagnosis not present

## 2020-01-03 DIAGNOSIS — Z5111 Encounter for antineoplastic chemotherapy: Secondary | ICD-10-CM | POA: Diagnosis not present

## 2020-01-03 DIAGNOSIS — C787 Secondary malignant neoplasm of liver and intrahepatic bile duct: Secondary | ICD-10-CM | POA: Diagnosis not present

## 2020-01-03 LAB — COMPREHENSIVE METABOLIC PANEL
ALT: 19 U/L (ref 0–44)
AST: 27 U/L (ref 15–41)
Albumin: 3.4 g/dL — ABNORMAL LOW (ref 3.5–5.0)
Alkaline Phosphatase: 112 U/L (ref 38–126)
Anion gap: 7 (ref 5–15)
BUN: 11 mg/dL (ref 8–23)
CO2: 24 mmol/L (ref 22–32)
Calcium: 8.4 mg/dL — ABNORMAL LOW (ref 8.9–10.3)
Chloride: 109 mmol/L (ref 98–111)
Creatinine, Ser: 0.7 mg/dL (ref 0.61–1.24)
GFR calc Af Amer: >60 mL/min (ref >60)
GFR calc non Af Amer: >60 mL/min (ref >60)
Glucose, Bld: 144 mg/dL — ABNORMAL HIGH (ref 70–99)
Potassium: 3.5 mmol/L (ref 3.5–5.1)
Sodium: 140 mmol/L (ref 135–145)
Total Bilirubin: 0.6 mg/dL (ref 0.3–1.2)
Total Protein: 6.7 g/dL (ref 6.5–8.1)

## 2020-01-03 LAB — CBC WITH DIFFERENTIAL/PLATELET
Abs Immature Granulocytes: 0.01 10*3/uL (ref 0.00–0.07)
Basophils Absolute: 0.1 10*3/uL (ref 0.0–0.1)
Basophils Relative: 1 %
Eosinophils Absolute: 0.6 10*3/uL — ABNORMAL HIGH (ref 0.0–0.5)
Eosinophils Relative: 12 %
HCT: 39.5 % (ref 39.0–52.0)
Hemoglobin: 13.6 g/dL (ref 13.0–17.0)
Immature Granulocytes: 0 %
Lymphocytes Relative: 32 %
Lymphs Abs: 1.5 10*3/uL (ref 0.7–4.0)
MCH: 33.6 pg (ref 26.0–34.0)
MCHC: 34.4 g/dL (ref 30.0–36.0)
MCV: 97.5 fL (ref 80.0–100.0)
Monocytes Absolute: 0.7 10*3/uL (ref 0.1–1.0)
Monocytes Relative: 15 %
Neutro Abs: 1.9 10*3/uL (ref 1.7–7.7)
Neutrophils Relative %: 40 %
Platelets: 110 10*3/uL — ABNORMAL LOW (ref 150–400)
RBC: 4.05 MIL/uL — ABNORMAL LOW (ref 4.22–5.81)
RDW: 13.4 % (ref 11.5–15.5)
WBC: 4.7 10*3/uL (ref 4.0–10.5)
nRBC: 0 % (ref 0.0–0.2)

## 2020-01-03 LAB — URINALYSIS, COMPLETE (UACMP) WITH MICROSCOPIC
Bacteria, UA: NONE SEEN
Bilirubin Urine: NEGATIVE
Glucose, UA: NEGATIVE mg/dL
Hgb urine dipstick: NEGATIVE
Ketones, ur: NEGATIVE mg/dL
Leukocytes,Ua: NEGATIVE
Nitrite: NEGATIVE
Protein, ur: 30 mg/dL — AB
Specific Gravity, Urine: 1.028 (ref 1.005–1.030)
pH: 5 (ref 5.0–8.0)

## 2020-01-03 MED ORDER — SODIUM CHLORIDE 0.9% FLUSH
10.0000 mL | Freq: Once | INTRAVENOUS | Status: AC
  Administered 2020-01-03: 10 mL via INTRAVENOUS
  Filled 2020-01-03: qty 10

## 2020-01-03 MED ORDER — DEXTROSE 5 % IV SOLN
Freq: Once | INTRAVENOUS | Status: AC
  Filled 2020-01-03: qty 250

## 2020-01-03 MED ORDER — SODIUM CHLORIDE 0.9 % IV SOLN
5.0000 mg/kg | Freq: Once | INTRAVENOUS | Status: AC
  Administered 2020-01-03: 400 mg via INTRAVENOUS
  Filled 2020-01-03: qty 16

## 2020-01-03 MED ORDER — DEXAMETHASONE SODIUM PHOSPHATE 10 MG/ML IJ SOLN
10.0000 mg | Freq: Once | INTRAMUSCULAR | Status: AC
  Administered 2020-01-03: 10 mg via INTRAVENOUS
  Filled 2020-01-03: qty 1

## 2020-01-03 MED ORDER — SODIUM CHLORIDE 0.9 % IV SOLN
5000.0000 mg | INTRAVENOUS | Status: DC
  Administered 2020-01-03: 5000 mg via INTRAVENOUS
  Filled 2020-01-03: qty 100

## 2020-01-03 MED ORDER — SODIUM CHLORIDE 0.9 % IV SOLN
Freq: Once | INTRAVENOUS | Status: AC
  Filled 2020-01-03: qty 250

## 2020-01-03 MED ORDER — LEUCOVORIN CALCIUM INJECTION 350 MG
800.0000 mg | Freq: Once | INTRAVENOUS | Status: AC
  Administered 2020-01-03: 800 mg via INTRAVENOUS
  Filled 2020-01-03: qty 40

## 2020-01-03 MED ORDER — OXALIPLATIN CHEMO INJECTION 100 MG/20ML
85.0000 mg/m2 | Freq: Once | INTRAVENOUS | Status: AC
  Administered 2020-01-03: 180 mg via INTRAVENOUS
  Filled 2020-01-03: qty 36

## 2020-01-03 MED ORDER — PALONOSETRON HCL INJECTION 0.25 MG/5ML
0.2500 mg | Freq: Once | INTRAVENOUS | Status: AC
  Administered 2020-01-03: 0.25 mg via INTRAVENOUS
  Filled 2020-01-03: qty 5

## 2020-01-03 NOTE — Assessment & Plan Note (Addendum)
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  Currently on FOLFOX plus Avastin; DEC 22nd- CT [compared to Sep 4th CT-[facility]]- overall stable; slight progression. CEA_ over all; stable.   #Proceed with today- cycle 12 of FOLFOX plus Avastin chemotherapy.  Labs today reviewed;  acceptable for treatment today. CT scan ordered  # PN- G-1 sec to oxaliplatin. STABLE.  # HTN- systolic 123456 stable. Recommend monitoring at home; birng a log. monitor closely.  # left shoulder pain- ? Tear- on meloxicam- STABLE.    # DISPOSITION: #FOLFOX+ Avastin today # follow up in 2 weeks- MD; labs- cbc/cmp/UA/ CEA; FOLFOX + avastin;pump off in 2 days; CT scan prior- Dr.B

## 2020-01-03 NOTE — Progress Notes (Signed)
[place] NOTE  Patient Care Team: Sofie Hartigan, MD as PCP - General (Family Medicine) [name], MD as Consulting Physician (Hematology and Oncology)  CHIEF COMPLAINTS/PURPOSE OF CONSULTATION: Colon cancer  #  Oncology History Overview Note  # MAY 2020- 3. 03/09/19 Liver biopsy. Microscopic examination shows malignant cells with glandular architecture consistent with adenocarcinoma. The malignant cells are positive for CK20 and CDX-2. These findings support the clinical impression of metastatic colon adenocarcinoma. 4. 03/10/19 R hemicolectomy. Tumor site cecum. Adenocarcinoma. Mucinous features present. G2. No tumor deposits. Invades visceral peritoneum. No tumor perforation. LVI present. PNI not identified. All margins uninvolved. 1/12 LNs. PT4apN1. Periappendiceal inflammation c/w resolving abscess. Microsatellite stable (MSS). [Dr.Mettu; [facility]]  # SEP 4th 2020 [compared to May 2020]  Interval increase in size of the metastases to the hepatic dome, The metastasis to the left hepatic lobe is unchanged; 2.  New subcentimeter hypoattenuating lesion in the inferior right hepatic lobe, incompletely characterized on CT. 3.  Postsurgical changes following right hemicolectomy.  # OCT 2020- FOLFOX +avastin; CT dec 22nd 2020- [compared to [facility] sep 9th 2020]-Liver- slight progression versus stable disease.  # NGS/F-ONE-MUTATED K-RAS [G]  # PALLIATIVE CARE EVALUATION: 09/20/2019-[name]  # PAIN MANAGEMENT: NA   DIAGNOSIS: COLON CANCER  STAGE:  IV     ;  GOALS:Palliative  CURRENT/MOST RECENT THERAPY : FOLFOX+ avastin [C]    Cancer of right colon ([place])  07/05/2019 Initial Diagnosis   Cancer of right colon ([place])   07/24/2019 -  Chemotherapy   The patient had palonosetron (ALOXI) injection 0.25 mg, 0.25 mg, Intravenous,  Once, 12 of 16 cycles Administration: 0.25 mg (07/24/2019), 0.25 mg (08/07/2019), 0.25 mg (08/21/2019), 0.25 mg (09/04/2019), 0.25 mg (09/20/2019), 0.25 mg  (10/04/2019), 0.25 mg (10/16/2019), 0.25 mg (10/30/2019), 0.25 mg (11/22/2019), 0.25 mg (12/06/2019), 0.25 mg (12/20/2019) leucovorin 800 mg in dextrose 5 % 250 mL infusion, 844 mg, Intravenous,  Once, 12 of 16 cycles Administration: 800 mg (07/24/2019), 800 mg (08/07/2019), 800 mg (08/21/2019), 800 mg (09/04/2019), 800 mg (09/20/2019), 800 mg (10/04/2019), 800 mg (10/16/2019), 800 mg (10/30/2019), 800 mg (11/22/2019), 800 mg (12/06/2019), 800 mg (12/20/2019) oxaliplatin (ELOXATIN) 180 mg in dextrose 5 % 500 mL chemo infusion, 85 mg/m2 = 180 mg, Intravenous,  Once, 12 of 16 cycles Administration: 180 mg (07/24/2019), 180 mg (08/07/2019), 180 mg (08/21/2019), 180 mg (09/04/2019), 180 mg (09/20/2019), 180 mg (10/04/2019), 180 mg (10/16/2019), 180 mg (10/30/2019), 180 mg (11/22/2019), 180 mg (12/06/2019), 180 mg (12/20/2019) fluorouracil (ADRUCIL) 5,000 mg in sodium chloride 0.9 % 150 mL chemo infusion, 5,050 mg, Intravenous, 1 Day/Dose, 12 of 16 cycles Administration: 5,000 mg (07/24/2019), 5,000 mg (08/07/2019), 5,000 mg (08/21/2019), 5,050 mg (09/04/2019), 5,000 mg (10/04/2019), 5,000 mg (10/16/2019), 5,000 mg (11/22/2019), 5,000 mg (12/06/2019), 5,000 mg (12/20/2019) bevacizumab-bvzr (ZIRABEV) 400 mg in sodium chloride 0.9 % 100 mL chemo infusion, 5 mg/kg = 400 mg, Intravenous,  Once, 12 of 16 cycles Administration: 400 mg (08/07/2019), 400 mg (08/21/2019), 400 mg (09/04/2019), 400 mg (09/20/2019), 400 mg (10/04/2019), 400 mg (10/16/2019), 400 mg (10/30/2019), 400 mg (11/22/2019), 400 mg (12/06/2019), 400 mg (12/20/2019)  for chemotherapy treatment.      HISTORY OF PRESENTING ILLNESS:  Oscar [name] 79 y.o.  male with metastatic colon cancer to the liver currently on FOLFOX plus Avastin is here for follow-up.  Patient appetite is good.  No weight loss.  No headaches.  No nosebleeds.  No swelling in the legs.  Denies any blood in stools or black or  stools.  Continues to have left shoulder pain not any worse.  Mild tingling and  numbness in extremities.  Review of Systems  Constitutional: Positive for malaise/fatigue. Negative for chills, diaphoresis, fever and weight loss.  HENT: Negative for nosebleeds and sore throat.   Eyes: Negative for double vision.  Respiratory: Negative for cough, hemoptysis, sputum production, shortness of breath and wheezing.   Cardiovascular: Negative for chest pain, palpitations, orthopnea and leg swelling.  Gastrointestinal: Negative for abdominal pain, blood in stool, constipation, diarrhea, heartburn, melena, nausea and vomiting.  Genitourinary: Negative for dysuria, frequency and urgency.  Musculoskeletal: Positive for back pain and joint pain.  Skin: Negative.  Negative for itching and rash.  Neurological: Positive for tingling. Negative for dizziness, focal weakness, weakness and headaches.  Endo/Heme/Allergies: Does not bruise/bleed easily.  Psychiatric/Behavioral: Negative for depression. The patient is not nervous/anxious and does not have insomnia.      MEDICAL HISTORY:  Past Medical History:  Diagnosis Date  . Arthritis   . Cancer ([place])    colon cancer 02/2019 per pt   . Diabetes mellitus without complication ([place])   . H/O colon cancer, stage IV   . Hyperlipemia   . Hypertension     SURGICAL HISTORY: Past Surgical History:  Procedure Laterality Date  . IR IMAGING GUIDED PORT INSERTION  07/20/2019  . JOINT REPLACEMENT      SOCIAL HISTORY: Social History   Socioeconomic History  . Marital status: Married    Spouse name: Not on file  . Number of children: Not on file  . Years of education: Not on file  . Highest education level: Not on file  Occupational History  . Not on file  Tobacco Use  . Smoking status: Current Every Day Smoker    Packs/day: 0.25    Types: Cigarettes  . Smokeless tobacco: Never Used  . Tobacco comment: 1 to 2 cigarettes a day occasionally  Substance and Sexual Activity  . Alcohol use: No  . Drug use: No  . Sexual activity: Not  on file  Other Topics Concern  . Not on file  Social History Narrative   Recruitment consultant retd; lives in [place]; smoking 3cig/day; [3/4 ppd x started at 9 years]; no alcohol. Son & daughter; wife dementia [waiting for placement].    Social Determinants of Health   Financial Resource Strain:   . Difficulty of Paying Living Expenses:   Food Insecurity:   . Worried About Charity fundraiser in the Last Year:   . Arboriculturist in the Last Year:   Transportation Needs:   . Film/video editor (Medical):   Marland Kitchen Lack of Transportation (Non-Medical):   Physical Activity:   . Days of Exercise per Week:   . Minutes of Exercise per Session:   Stress:   . Feeling of Stress :   Social Connections:   . Frequency of Communication with Friends and Family:   . Frequency of Social Gatherings with Friends and Family:   . Attends Religious Services:   . Active Member of Clubs or Organizations:   . Attends Archivist Meetings:   Marland Kitchen Marital Status:   Intimate Partner Violence:   . Fear of Current or Ex-Partner:   . Emotionally Abused:   Marland Kitchen Physically Abused:   . Sexually Abused:     FAMILY HISTORY: Family History  Problem Relation Age of Onset  . Peptic Ulcer Disease Father     ALLERGIES:  is allergic to ace inhibitors.  MEDICATIONS:  Current Outpatient Medications  Medication Sig Dispense Refill  . amLODipine (NORVASC) 5 MG tablet Take 5 mg by mouth daily.    . fluticasone (FLONASE) 50 MCG/ACT nasal spray Place 1 spray into both nostrils daily as needed.     . meloxicam (MOBIC) 15 MG tablet Take 1 tablet (15 mg total) by mouth daily. 90 tablet 0  . pravastatin (PRAVACHOL) 40 MG tablet Take 40 mg by mouth daily.    . tamsulosin (FLOMAX) 0.4 MG CAPS capsule Take 1 capsule by mouth daily.    Marland Kitchen lidocaine-prilocaine (EMLA) cream Apply 1 application topically as needed. (Patient not taking: Reported on 12/05/2019) 30 g 0  . ondansetron (ZOFRAN-ODT) 4 MG disintegrating  tablet Take 1 tablet (4 mg total) by mouth every 6 (six) hours as needed for nausea. (Patient not taking: Reported on 12/05/2019) 20 tablet 0  . prochlorperazine (COMPAZINE) 10 MG tablet Take 1 tablet (10 mg total) by mouth every 6 (six) hours as needed for nausea or vomiting. (Patient not taking: Reported on 12/05/2019) 40 tablet 1   No current facility-administered medications for this visit.   Facility-Administered Medications Ordered in Other Visits  Medication Dose Route Frequency Provider Last Rate Last Admin  . bevacizumab-bvzr (ZIRABEV) 400 mg in sodium chloride 0.9 % 100 mL chemo infusion  5 mg/kg (Treatment Plan Recorded) Intravenous Once Cammie Sickle, MD 696 mL/hr at 01/03/20 0935 400 mg at 01/03/20 0935  . dextrose 5 % solution   Intravenous Once Charlaine Dalton R, MD      . fluorouracil (ADRUCIL) 5,000 mg in sodium chloride 0.9 % 150 mL chemo infusion  5,000 mg Intravenous 1 day or 1 dose Charlaine Dalton R, MD      . leucovorin 800 mg in dextrose 5 % 250 mL infusion  800 mg Intravenous Once Charlaine Dalton R, MD      . oxaliplatin (ELOXATIN) 180 mg in dextrose 5 % 500 mL chemo infusion  85 mg/m2 (Treatment Plan Recorded) Intravenous Once Cammie Sickle, MD          .  PHYSICAL EXAMINATION: ECOG PERFORMANCE STATUS: 0 - Asymptomatic  Vitals:   01/03/20 0826  BP: (!) 162/77  Pulse: 77  Temp: (!) 96.9 F (36.1 C)   Filed Weights   01/03/20 0826  Weight: 187 lb (84.8 kg)    Physical Exam  Constitutional: He is oriented to person, place, and time and well-developed, well-nourished, and in no distress.  HENT:  Head: Normocephalic and atraumatic.  Mouth/Throat: Oropharynx is clear and moist. No oropharyngeal exudate.  Eyes: Pupils are equal, round, and reactive to light.  Cardiovascular: Normal rate and regular rhythm.  Pulmonary/Chest: No respiratory distress. He has no wheezes.  Abdominal: Soft. Bowel sounds are normal. He exhibits no  distension and no mass. There is no abdominal tenderness. There is no rebound and no guarding.  Musculoskeletal:        General: No tenderness or edema. Normal range of motion.     Cervical back: Normal range of motion and neck supple.  Neurological: He is alert and oriented to person, place, and time.  Skin: Skin is warm.  Psychiatric: Affect normal.   LABORATORY DATA:  I have reviewed the data as listed Lab Results  Component Value Date   WBC 4.7 01/03/2020   HGB 13.6 01/03/2020   HCT 39.5 01/03/2020   MCV 97.5 01/03/2020   PLT 110 (L) 01/03/2020   Recent Labs    12/06/19 0816 12/20/19 3734  01/03/20 0803  NA 140 140 140  K 3.6 3.7 3.5  CL 108 110 109  CO2 _0 GLUCOSE 191* 155* 144*  BUN _1 CREATININE 0.70 0.82 0.70  CALCIUM 8.5* 8.5* 8.4*  GFRNONAA >60 >60 >60  GFRAA >60 >60 >60  PROT 6.6 6.7 6.7  ALBUMIN 3.5 3.3* 3.4*  AST _2 ALT _3 ALKPHOS 97 106 112  BILITOT 0.5 0.5 0.6    RADIOGRAPHIC STUDIES: I have personally reviewed the radiological images as listed and agreed with the findings in the report. No results found.  ASSESSMENT & PLAN:   Cancer of right colon ([place]) #Right-sided colon adenocarcinoma-with synchronous metastasis to liver/unresectable.  Currently on FOLFOX plus Avastin; DEC 22nd- CT [compared to Sep 4th CT-[facility]]- overall stable; slight progression. CEA_ over all; stable.   #Proceed with today- cycle 12 of FOLFOX plus Avastin chemotherapy.  Labs today reviewed;  acceptable for treatment today. CT scan ordered  # PN- G-1 sec to oxaliplatin. STABLE.  # HTN- systolic 301S stable. Recommend monitoring at home; birng a log. monitor closely.  # left shoulder pain- ? Tear- on meloxicam- STABLE.    # DISPOSITION: #FOLFOX+ Avastin today # follow up in 2 weeks- MD; labs- cbc/cmp/UA/ CEA; FOLFOX + avastin;pump off in 2 days; CT scan prior- Dr.B  All questions were answered. The patient knows to call the clinic with any  problems, questions or concerns.    Cammie Sickle, MD 01/03/2020 9:39 AM

## 2020-01-03 NOTE — Progress Notes (Signed)
Per Dr. Rogue Bussing okay to proceed with Oscar [name] with Urine protein of 30 from 12/20/2019. Pt gave urine sample, and sample sent to lab.

## 2020-01-04 LAB — CEA: CEA: 20.6 ng/mL — ABNORMAL HIGH (ref 0.0–4.7)

## 2020-01-05 ENCOUNTER — Other Ambulatory Visit: Payer: Self-pay

## 2020-01-05 ENCOUNTER — Inpatient Hospital Stay: Payer: Medicare HMO

## 2020-01-05 VITALS — BP 167/83 | HR 74 | Temp 97.0°F | Resp 18

## 2020-01-05 DIAGNOSIS — Z5112 Encounter for antineoplastic immunotherapy: Secondary | ICD-10-CM | POA: Diagnosis not present

## 2020-01-05 DIAGNOSIS — Z7189 Other specified counseling: Secondary | ICD-10-CM

## 2020-01-05 DIAGNOSIS — G620 Drug-induced polyneuropathy: Secondary | ICD-10-CM | POA: Diagnosis not present

## 2020-01-05 DIAGNOSIS — C787 Secondary malignant neoplasm of liver and intrahepatic bile duct: Secondary | ICD-10-CM | POA: Diagnosis not present

## 2020-01-05 DIAGNOSIS — I10 Essential (primary) hypertension: Secondary | ICD-10-CM | POA: Diagnosis not present

## 2020-01-05 DIAGNOSIS — Z452 Encounter for adjustment and management of vascular access device: Secondary | ICD-10-CM | POA: Diagnosis not present

## 2020-01-05 DIAGNOSIS — C182 Malignant neoplasm of ascending colon: Secondary | ICD-10-CM

## 2020-01-05 DIAGNOSIS — F17210 Nicotine dependence, cigarettes, uncomplicated: Secondary | ICD-10-CM | POA: Diagnosis not present

## 2020-01-05 DIAGNOSIS — Z5111 Encounter for antineoplastic chemotherapy: Secondary | ICD-10-CM | POA: Diagnosis not present

## 2020-01-05 MED ORDER — HEPARIN SOD (PORK) LOCK FLUSH 100 UNIT/ML IV SOLN
500.0000 [IU] | Freq: Once | INTRAVENOUS | Status: AC | PRN
  Administered 2020-01-05: 500 [IU]
  Filled 2020-01-05: qty 5

## 2020-01-12 ENCOUNTER — Ambulatory Visit
Admission: RE | Admit: 2020-01-12 | Discharge: 2020-01-12 | Disposition: A | Discharge status: Home / Self Care | Payer: Medicare HMO | Source: Ambulatory Visit | Attending: Internal Medicine | Admitting: Internal Medicine

## 2020-01-12 ENCOUNTER — Other Ambulatory Visit: Payer: Self-pay

## 2020-01-12 DIAGNOSIS — C182 Malignant neoplasm of ascending colon: Secondary | ICD-10-CM | POA: Insufficient documentation

## 2020-01-12 DIAGNOSIS — C189 Malignant neoplasm of colon, unspecified: Secondary | ICD-10-CM | POA: Diagnosis not present

## 2020-01-12 DIAGNOSIS — C787 Secondary malignant neoplasm of liver and intrahepatic bile duct: Secondary | ICD-10-CM | POA: Diagnosis not present

## 2020-01-12 IMAGING — CT CT ABD-PELV W/ CM
1 series · 15 of 26 positions shown, 19 images · IV contrast (APPLIED)
Comparison: [DATE]

CLINICAL DATA: Colon cancer with liver metastases, on chemotherapy.
Status post right hemicolectomy.

EXAM:
CT ABDOMEN AND PELVIS WITH CONTRAST
TECHNIQUE: Multidetector CT imaging of the abdomen and pelvis was performed
using the standard protocol following bolus administration of
intravenous contrast.
CONTRAST:  100mL OMNIPAQUE IOHEXOL 300 MG/ML  SOLN

[Series 5: lung · axial · 0.81mm/px · z∈[-714,-598]mm · 15 of 26 slices shown, 19 images]
[im 2/26  soft-tissue]
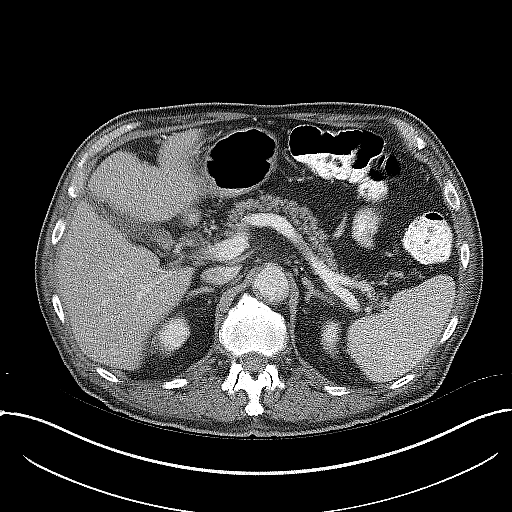
[im 2/26  bone]
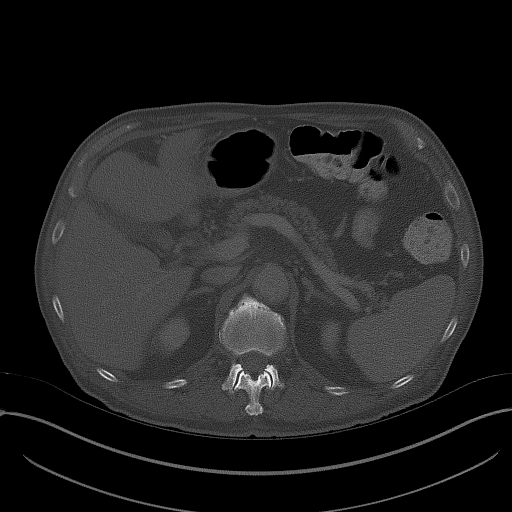
[im 4/26  soft-tissue]
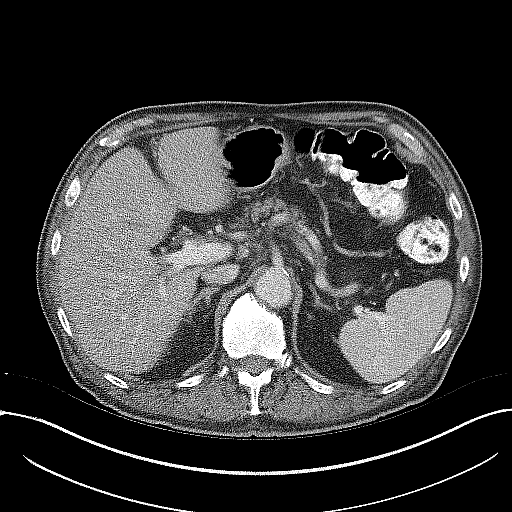
[im 6/26  soft-tissue]
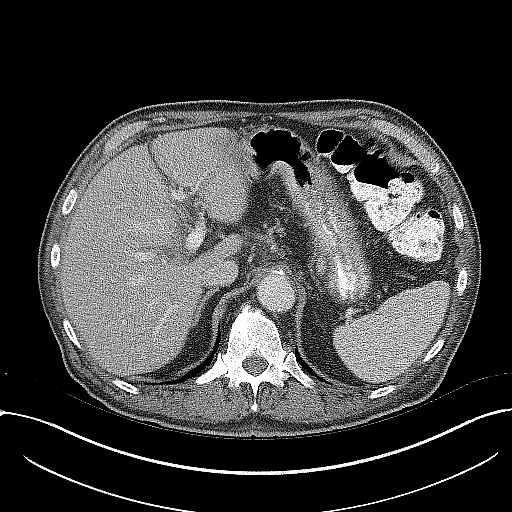
[im 8/26  soft-tissue]
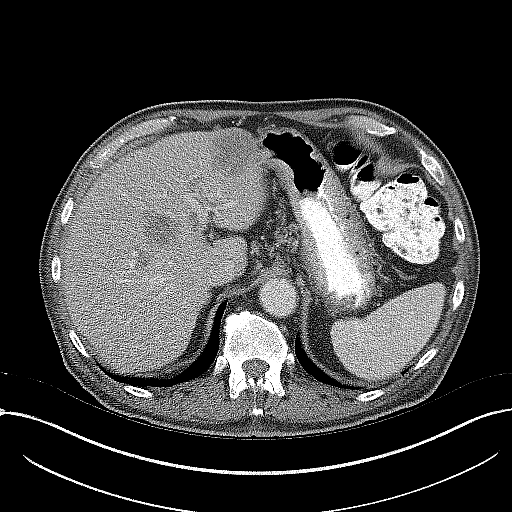
[im 10/26  soft-tissue]
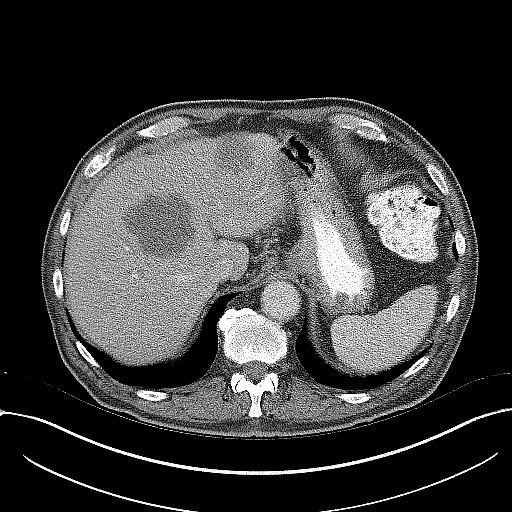
[im 12/26  soft-tissue]
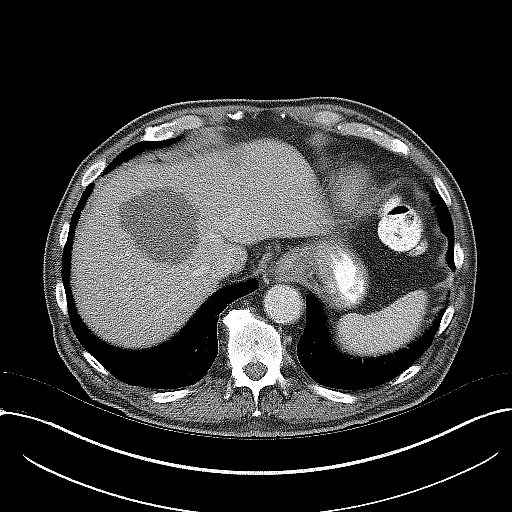
[im 14/26  soft-tissue]
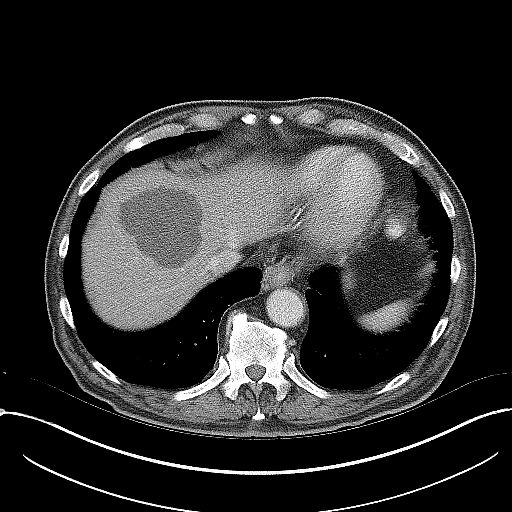
[im 15/26  soft-tissue]
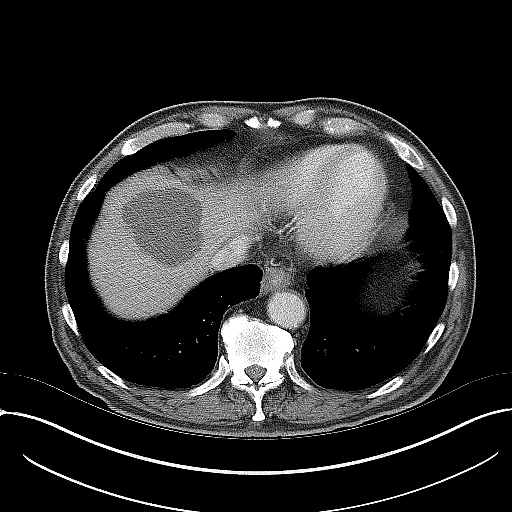
[im 17/26  soft-tissue]
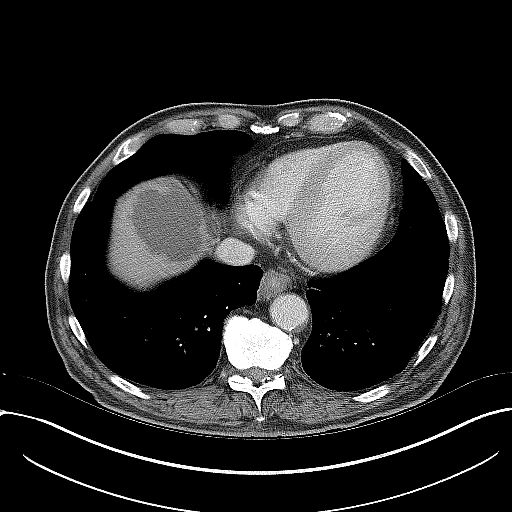
[im 17/26  bone]
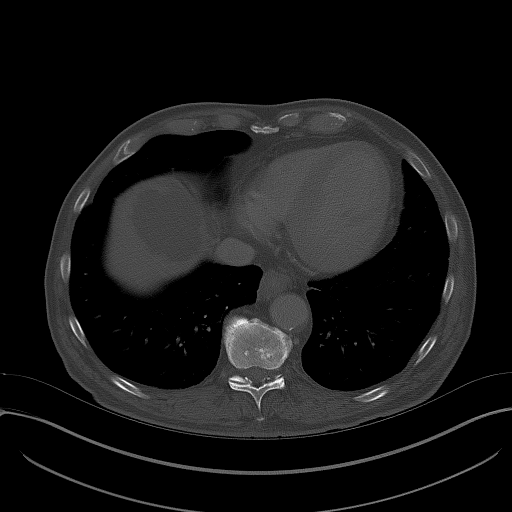
[im 19/26  soft-tissue]
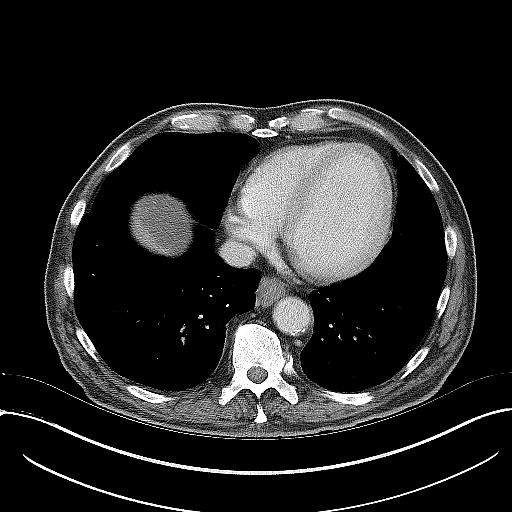
[im 21/26  soft-tissue]
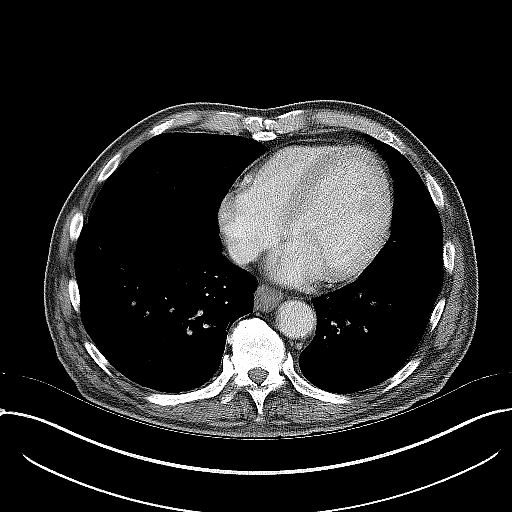
[im 22/26  lung]
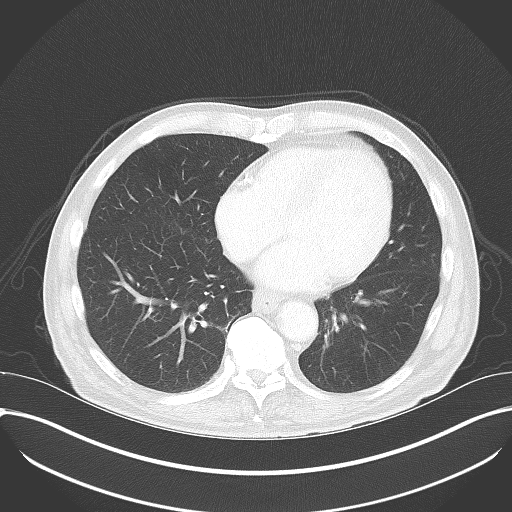
[im 23/26  soft-tissue]
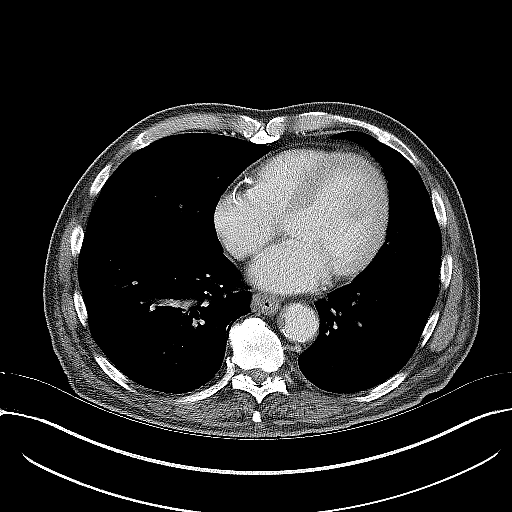
[im 23/26  lung]
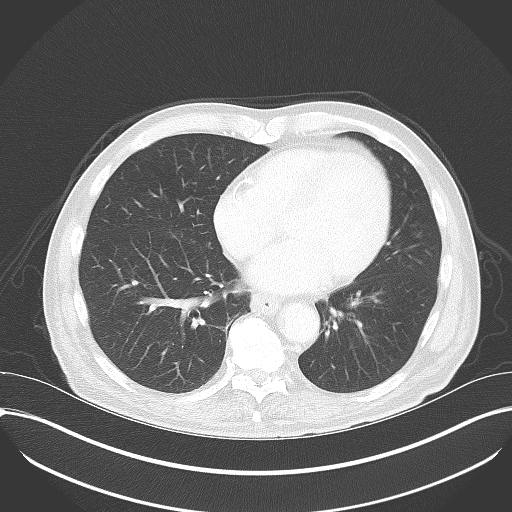
[im 24/26  lung]
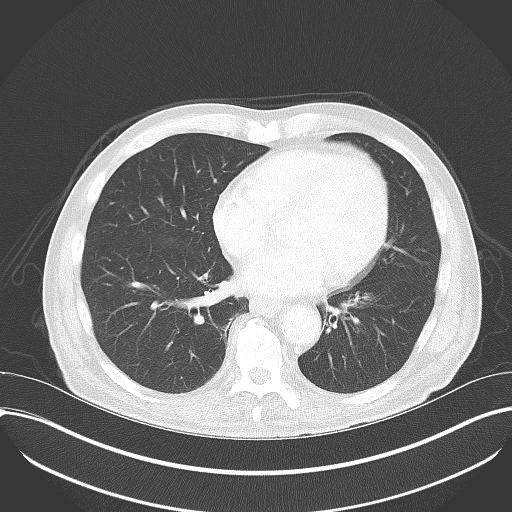
[im 25/26  soft-tissue]
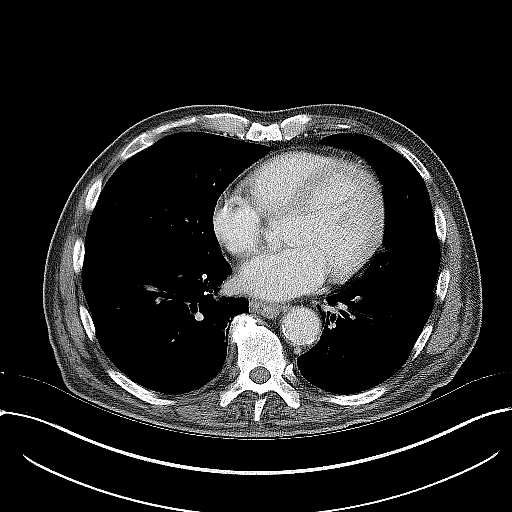
[im 25/26  lung]
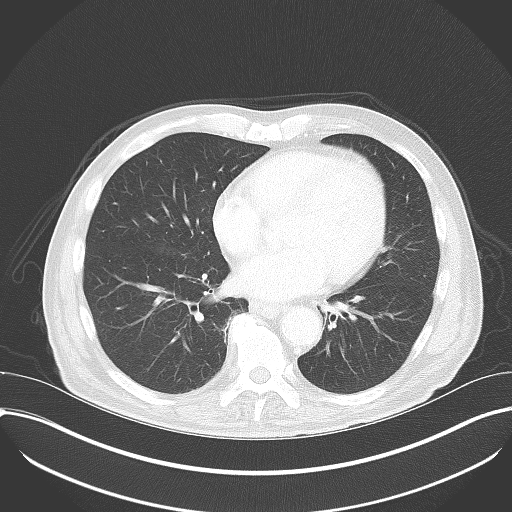

[15 of 26 positions shown; findings below may reference images not displayed]

FINDINGS: Lower chest: 4 mm subpleural nodule in the left lower lobe (series
5/image 7), previously 3 mm. Pulmonary metastasis is not excluded.

Hepatobiliary: Cystic lesion in segment 8 measures 6.2 x 7.3 cm
(series 2/image 14), previously 6.4 x 6.9 cm, grossly unchanged.
x 3.9 cm hypoenhancing lesion in segment 4A (series 2/image 19),
previously 3.9 x 4.6 cm, mildly improved. 11 mm hypoenhancing lesion
inferiorly in segment 6 (series 2/image 29), previously 11 x 12 mm,
grossly unchanged.

Gallbladder is notable for a tiny layering gallstone (series 2/image
28), without associated inflammatory changes. No intrahepatic or
extrahepatic ductal dilatation.

Pancreas: Within normal limits.

Spleen: Within normal limits.

Adrenals/Urinary Tract: Adrenal glands are within normal limits.

4.4 cm left lower pole renal cyst. Right kidney is within normal
limits. No hydronephrosis.

Bladder is mildly thick-walled although underdistended.

Stomach/Bowel: Stomach is within normal limits.

Status post right hemicolectomy with appendectomy. 12 x 15 mm soft
tissue lesion at the surgical margin (series 2/image 47), previously
9 x 13 mm when measured in a similar fashion on the prior,
suspicious for possible suture line recurrence.

Vascular/Lymphatic: No evidence of abdominal aortic aneurysm.

Atherosclerotic calcifications of the abdominal aorta and branch
vessels.

No suspicious abdominopelvic lymphadenopathy.

Reproductive: Prostate is unremarkable.

Other: No abdominopelvic ascites.

Musculoskeletal: Degenerative changes of the visualized
thoracolumbar spine.
IMPRESSION: Status post right hemicolectomy. 15 mm soft tissue nodule at the
surgical margin, minimally progressive, suspicious for suture line
recurrence.

Hepatic metastases are stable versus mildly improved.

4 mm left lower lobe nodule, slightly more conspicuous than on the
prior, metastasis not excluded.

## 2020-01-12 MED ORDER — IOHEXOL 300 MG/ML  SOLN
100.0000 mL | Freq: Once | INTRAMUSCULAR | Status: AC | PRN
  Administered 2020-01-12: 100 mL via INTRAVENOUS

## 2020-01-17 ENCOUNTER — Inpatient Hospital Stay: Payer: Medicare HMO

## 2020-01-17 ENCOUNTER — Telehealth: Payer: Self-pay | Admitting: *Deleted

## 2020-01-17 ENCOUNTER — Inpatient Hospital Stay: Payer: Medicare HMO | Admitting: Internal Medicine

## 2020-01-17 NOTE — Telephone Encounter (Signed)
Contacted patient. He no showed for the chemotherapy apt. Patient stated that he left a vm yesterday for our clinic to cnl today's apt. He c/o of  Watery itchy eyes, and running nose. No fevers. No nausea/vomiting or diarrhea. No known covid exposures. He has sent his daughter to get flonase. He would like to start the flonase first. He declines any virtual apts with [facility]. He is unable to reschedule his apts for next week due to his family being out of town on vacation (to [place]). He has no one to transport him to the apts for this reason.  He would like to r/s his chemotherapy for 01/29/20.  Colette Please arrange the r/s apts. Thanks.

## 2020-01-18 ENCOUNTER — Inpatient Hospital Stay: Payer: Medicare HMO | Attending: Hospice and Palliative Medicine | Admitting: Hospice and Palliative Medicine

## 2020-01-18 DIAGNOSIS — C182 Malignant neoplasm of ascending colon: Secondary | ICD-10-CM | POA: Insufficient documentation

## 2020-01-18 DIAGNOSIS — Z5111 Encounter for antineoplastic chemotherapy: Secondary | ICD-10-CM | POA: Insufficient documentation

## 2020-01-18 DIAGNOSIS — Z5112 Encounter for antineoplastic immunotherapy: Secondary | ICD-10-CM | POA: Insufficient documentation

## 2020-01-18 DIAGNOSIS — C787 Secondary malignant neoplasm of liver and intrahepatic bile duct: Secondary | ICD-10-CM | POA: Insufficient documentation

## 2020-01-18 NOTE — Progress Notes (Signed)
Attempted to call patient but was unable to reach him.  Voicemail left.  Will reschedule.

## 2020-01-19 ENCOUNTER — Inpatient Hospital Stay: Payer: Medicare HMO

## 2020-01-19 ENCOUNTER — Telehealth: Payer: Self-pay | Admitting: Internal Medicine

## 2020-01-19 NOTE — Telephone Encounter (Signed)
On 3/26 spoke to patient re: results of CT scan; 40mm nodule in colon; stable- liver lesions. Follow up as planned.  GB

## 2020-01-22 DIAGNOSIS — C182 Malignant neoplasm of ascending colon: Secondary | ICD-10-CM | POA: Diagnosis not present

## 2020-01-22 NOTE — Progress Notes (Signed)
Pharmacist Chemotherapy Monitoring - Follow Up Assessment    I verify that I have reviewed each item in the below checklist:  . Regimen for the patient is scheduled for the appropriate day and plan matches scheduled date. . Appropriate non-routine labs are ordered dependent on drug ordered. . If applicable, additional medications reviewed and ordered per protocol based on lifetime cumulative doses and/or treatment regimen.   Plan for follow-up and/or issues identified: No . I-vent associated with next due treatment: No . MD and/or nursing notified: No  Korde Jeppsen K 01/22/2020 11:42 AM 

## 2020-01-29 ENCOUNTER — Inpatient Hospital Stay (HOSPITAL_BASED_OUTPATIENT_CLINIC_OR_DEPARTMENT_OTHER): Payer: Medicare HMO | Admitting: Hospice and Palliative Medicine

## 2020-01-29 ENCOUNTER — Inpatient Hospital Stay: Payer: Medicare HMO

## 2020-01-29 ENCOUNTER — Inpatient Hospital Stay: Payer: Medicare HMO | Admitting: Internal Medicine

## 2020-01-29 ENCOUNTER — Other Ambulatory Visit: Payer: Self-pay

## 2020-01-29 DIAGNOSIS — R531 Weakness: Secondary | ICD-10-CM

## 2020-01-29 DIAGNOSIS — R54 Age-related physical debility: Secondary | ICD-10-CM | POA: Diagnosis not present

## 2020-01-29 DIAGNOSIS — C787 Secondary malignant neoplasm of liver and intrahepatic bile duct: Secondary | ICD-10-CM | POA: Diagnosis not present

## 2020-01-29 DIAGNOSIS — Z515 Encounter for palliative care: Secondary | ICD-10-CM

## 2020-01-29 DIAGNOSIS — C182 Malignant neoplasm of ascending colon: Secondary | ICD-10-CM

## 2020-01-29 DIAGNOSIS — Z5111 Encounter for antineoplastic chemotherapy: Secondary | ICD-10-CM | POA: Diagnosis not present

## 2020-01-29 DIAGNOSIS — Z5112 Encounter for antineoplastic immunotherapy: Secondary | ICD-10-CM | POA: Diagnosis not present

## 2020-01-29 DIAGNOSIS — Z7189 Other specified counseling: Secondary | ICD-10-CM

## 2020-01-29 LAB — URINALYSIS, COMPLETE (UACMP) WITH MICROSCOPIC
Bacteria, UA: NONE SEEN
Glucose, UA: NEGATIVE mg/dL
Hgb urine dipstick: NEGATIVE
Ketones, ur: NEGATIVE mg/dL
Leukocytes,Ua: NEGATIVE
Nitrite: NEGATIVE
Protein, ur: 30 mg/dL — AB
Specific Gravity, Urine: 1.03 (ref 1.005–1.030)
pH: 5 (ref 5.0–8.0)

## 2020-01-29 LAB — COMPREHENSIVE METABOLIC PANEL
ALT: 17 U/L (ref 0–44)
AST: 29 U/L (ref 15–41)
Albumin: 3.2 g/dL — ABNORMAL LOW (ref 3.5–5.0)
Alkaline Phosphatase: 100 U/L (ref 38–126)
Anion gap: 6 (ref 5–15)
BUN: 10 mg/dL (ref 8–23)
CO2: 25 mmol/L (ref 22–32)
Calcium: 8.3 mg/dL — ABNORMAL LOW (ref 8.9–10.3)
Chloride: 109 mmol/L (ref 98–111)
Creatinine, Ser: 0.72 mg/dL (ref 0.61–1.24)
GFR calc Af Amer: >60 mL/min (ref >60)
GFR calc non Af Amer: >60 mL/min (ref >60)
Glucose, Bld: 168 mg/dL — ABNORMAL HIGH (ref 70–99)
Potassium: 3.8 mmol/L (ref 3.5–5.1)
Sodium: 140 mmol/L (ref 135–145)
Total Bilirubin: 0.8 mg/dL (ref 0.3–1.2)
Total Protein: 6.4 g/dL — ABNORMAL LOW (ref 6.5–8.1)

## 2020-01-29 LAB — CBC WITH DIFFERENTIAL/PLATELET
Abs Immature Granulocytes: 0.01 10*3/uL (ref 0.00–0.07)
Basophils Absolute: 0 10*3/uL (ref 0.0–0.1)
Basophils Relative: 1 %
Eosinophils Absolute: 0.5 10*3/uL (ref 0.0–0.5)
Eosinophils Relative: 12 %
HCT: 39.1 % (ref 39.0–52.0)
Hemoglobin: 13.1 g/dL (ref 13.0–17.0)
Immature Granulocytes: 0 %
Lymphocytes Relative: 31 %
Lymphs Abs: 1.3 10*3/uL (ref 0.7–4.0)
MCH: 33.2 pg (ref 26.0–34.0)
MCHC: 33.5 g/dL (ref 30.0–36.0)
MCV: 99 fL (ref 80.0–100.0)
Monocytes Absolute: 0.4 10*3/uL (ref 0.1–1.0)
Monocytes Relative: 10 %
Neutro Abs: 2 10*3/uL (ref 1.7–7.7)
Neutrophils Relative %: 46 %
Platelets: 128 10*3/uL — ABNORMAL LOW (ref 150–400)
RBC: 3.95 MIL/uL — ABNORMAL LOW (ref 4.22–5.81)
RDW: 14 % (ref 11.5–15.5)
WBC: 4.3 10*3/uL (ref 4.0–10.5)
nRBC: 0 % (ref 0.0–0.2)

## 2020-01-29 MED ORDER — SODIUM CHLORIDE 0.9 % IV SOLN
5000.0000 mg | INTRAVENOUS | Status: DC
  Administered 2020-01-29: 5000 mg via INTRAVENOUS
  Filled 2020-01-29: qty 100

## 2020-01-29 MED ORDER — DEXTROSE 5 % IV SOLN
Freq: Once | INTRAVENOUS | Status: AC
  Filled 2020-01-29: qty 250

## 2020-01-29 MED ORDER — SODIUM CHLORIDE 0.9% FLUSH
10.0000 mL | Freq: Once | INTRAVENOUS | Status: AC
  Administered 2020-01-29: 10 mL via INTRAVENOUS
  Filled 2020-01-29: qty 10

## 2020-01-29 MED ORDER — SODIUM CHLORIDE 0.9 % IV SOLN
5.0000 mg/kg | Freq: Once | INTRAVENOUS | Status: AC
  Administered 2020-01-29: 400 mg via INTRAVENOUS
  Filled 2020-01-29: qty 16

## 2020-01-29 MED ORDER — SODIUM CHLORIDE 0.9 % IV SOLN
Freq: Once | INTRAVENOUS | Status: AC
  Filled 2020-01-29: qty 250

## 2020-01-29 MED ORDER — LEUCOVORIN CALCIUM INJECTION 350 MG
800.0000 mg | Freq: Once | INTRAVENOUS | Status: AC
  Administered 2020-01-29: 800 mg via INTRAVENOUS
  Filled 2020-01-29: qty 25

## 2020-01-29 MED ORDER — PALONOSETRON HCL INJECTION 0.25 MG/5ML
0.2500 mg | Freq: Once | INTRAVENOUS | Status: AC
  Administered 2020-01-29: 0.25 mg via INTRAVENOUS
  Filled 2020-01-29: qty 5

## 2020-01-29 MED ORDER — DEXAMETHASONE SODIUM PHOSPHATE 10 MG/ML IJ SOLN
10.0000 mg | Freq: Once | INTRAMUSCULAR | Status: AC
  Administered 2020-01-29: 10 mg via INTRAVENOUS
  Filled 2020-01-29: qty 1

## 2020-01-29 MED ORDER — OXALIPLATIN CHEMO INJECTION 100 MG/20ML
85.0000 mg/m2 | Freq: Once | INTRAVENOUS | Status: AC
  Administered 2020-01-29: 180 mg via INTRAVENOUS
  Filled 2020-01-29: qty 36

## 2020-01-29 NOTE — Progress Notes (Signed)
[facility]  [phone] [phone]   Name: Oscar [name] Date: 01/29/2020 MRN: [id number]  DOB: 1941-02-24  Patient Care Team: Sofie Hartigan, MD as PCP - General (Family Medicine) [name], MD as Consulting Physician (Hematology and Oncology)    REASON FOR CONSULTATION: Palliative Care consult requested for this 79 y.o. male with multiple medical problems including stage IV colon cancer on treatment with FOLFOX.  Patient was referred to palliative care to help address goals and manage ongoing symptoms.  SOCIAL HISTORY:     reports that he has been smoking cigarettes. He has been smoking about 0.25 packs per day. He has never used smokeless tobacco. He reports that he does not drink alcohol or use drugs.   Patient is married.  He and his wife live with his stepdaughter.  His wife has advanced dementia and is followed at home by hospice.  Patient has a son and daughter and two twin stepdaughters.  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  ADVANCE DIRECTIVES:  Does not have  CODE STATUS: DNR/DNI (MOST form completed on 11/02/2019)  PAST MEDICAL HISTORY: Past Medical History:  Diagnosis Date  . Arthritis   . Cancer ([place])    colon cancer 02/2019 per pt   . Diabetes mellitus without complication ([place])   . H/O colon cancer, stage IV   . Hyperlipemia   . Hypertension     PAST SURGICAL HISTORY:  Past Surgical History:  Procedure Laterality Date  . IR IMAGING GUIDED PORT INSERTION  07/20/2019  . JOINT REPLACEMENT      HEMATOLOGY/ONCOLOGY HISTORY:  Oncology History Overview Note  # MAY 2020- 3. 03/09/19 Liver biopsy. Microscopic examination shows malignant cells with glandular architecture consistent with adenocarcinoma. The malignant cells are positive for CK20 and CDX-2. These findings support the clinical impression of metastatic colon adenocarcinoma. 4. 03/10/19 R hemicolectomy. Tumor site  cecum. Adenocarcinoma. Mucinous features present. G2. No tumor deposits. Invades visceral peritoneum. No tumor perforation. LVI present. PNI not identified. All margins uninvolved. 1/12 LNs. PT4apN1. Periappendiceal inflammation c/w resolving abscess. Microsatellite stable (MSS). [Dr.Mettu; [facility]]  # SEP 4th 2020 [compared to May 2020]  Interval increase in size of the metastases to the hepatic dome, The metastasis to the left hepatic lobe is unchanged; 2.  New subcentimeter hypoattenuating lesion in the inferior right hepatic lobe, incompletely characterized on CT. 3.  Postsurgical changes following right hemicolectomy.  # OCT 2020- FOLFOX +avastin; CT dec 22nd 2020- [compared to [facility] sep 9th 2020]-Liver- slight progression versus stable disease.  # NGS/F-ONE-MUTATED K-RAS [G]  # PALLIATIVE CARE EVALUATION: 09/20/2019-[name]  # PAIN MANAGEMENT: NA   DIAGNOSIS: COLON CANCER  STAGE:  IV     ;  GOALS:Palliative  CURRENT/MOST RECENT THERAPY : FOLFOX+ avastin [C]    Cancer of right colon ([place])  07/05/2019 Initial Diagnosis   Cancer of right colon ([place])   07/24/2019 -  Chemotherapy   The patient had palonosetron (ALOXI) injection 0.25 mg, 0.25 mg, Intravenous,  Once, 13 of 16 cycles Administration: 0.25 mg (07/24/2019), 0.25 mg (08/07/2019), 0.25 mg (08/21/2019), 0.25 mg (09/04/2019), 0.25 mg (09/20/2019), 0.25 mg (10/04/2019), 0.25 mg (10/16/2019), 0.25 mg (10/30/2019), 0.25 mg (11/22/2019), 0.25 mg (12/06/2019), 0.25 mg (12/20/2019), 0.25 mg (01/03/2020) leucovorin 800 mg in dextrose 5 % 250 mL infusion, 844 mg, Intravenous,  Once, 13 of 16 cycles Administration: 800 mg (07/24/2019), 800 mg (08/07/2019), 800 mg (08/21/2019), 800 mg (09/04/2019), 800 mg (09/20/2019), 800 mg (10/04/2019),  800 mg (10/16/2019), 800 mg (10/30/2019), 800 mg (11/22/2019), 800 mg (12/06/2019), 800 mg (12/20/2019), 800 mg (01/03/2020) oxaliplatin (ELOXATIN) 180 mg in dextrose 5 % 500 mL chemo infusion, 85 mg/m2 = 180 mg, Intravenous,  Once,  13 of 16 cycles Administration: 180 mg (07/24/2019), 180 mg (08/07/2019), 180 mg (08/21/2019), 180 mg (09/04/2019), 180 mg (09/20/2019), 180 mg (10/04/2019), 180 mg (10/16/2019), 180 mg (10/30/2019), 180 mg (11/22/2019), 180 mg (12/06/2019), 180 mg (12/20/2019), 180 mg (01/03/2020) fluorouracil (ADRUCIL) 5,000 mg in sodium chloride 0.9 % 150 mL chemo infusion, 5,050 mg, Intravenous, 1 Day/Dose, 13 of 16 cycles Administration: 5,000 mg (07/24/2019), 5,000 mg (08/07/2019), 5,000 mg (08/21/2019), 5,050 mg (09/04/2019), 5,000 mg (10/04/2019), 5,000 mg (10/16/2019), 5,000 mg (11/22/2019), 5,000 mg (12/06/2019), 5,000 mg (12/20/2019), 5,000 mg (01/03/2020) bevacizumab-bvzr (ZIRABEV) 400 mg in sodium chloride 0.9 % 100 mL chemo infusion, 5 mg/kg = 400 mg, Intravenous,  Once, 13 of 16 cycles Administration: 400 mg (08/07/2019), 400 mg (08/21/2019), 400 mg (09/04/2019), 400 mg (09/20/2019), 400 mg (10/04/2019), 400 mg (10/16/2019), 400 mg (10/30/2019), 400 mg (11/22/2019), 400 mg (12/06/2019), 400 mg (12/20/2019), 400 mg (01/03/2020)  for chemotherapy treatment.      ALLERGIES:  is allergic to ace inhibitors.  MEDICATIONS:  Current Outpatient Medications  Medication Sig Dispense Refill  . amLODipine (NORVASC) 5 MG tablet Take 5 mg by mouth daily.    . fluticasone (FLONASE) 50 MCG/ACT nasal spray Place 1 spray into both nostrils daily as needed.     . lidocaine-prilocaine (EMLA) cream Apply 1 application topically as needed. (Patient not taking: Reported on 12/05/2019) 30 g 0  . meloxicam (MOBIC) 15 MG tablet Take 1 tablet (15 mg total) by mouth daily. 90 tablet 0  . ondansetron (ZOFRAN-ODT) 4 MG disintegrating tablet Take 1 tablet (4 mg total) by mouth every 6 (six) hours as needed for nausea. (Patient not taking: Reported on 12/05/2019) 20 tablet 0  . pravastatin (PRAVACHOL) 40 MG tablet Take 40 mg by mouth daily.    . prochlorperazine (COMPAZINE) 10 MG tablet Take 1 tablet (10 mg total) by mouth every 6 (six) hours as needed for  nausea or vomiting. (Patient not taking: Reported on 12/05/2019) 40 tablet 1  . tamsulosin (FLOMAX) 0.4 MG CAPS capsule Take 1 capsule by mouth daily.     No current facility-administered medications for this visit.   Facility-Administered Medications Ordered in Other Visits  Medication Dose Route Frequency Provider Last Rate Last Admin  . fluorouracil (ADRUCIL) 5,000 mg in sodium chloride 0.9 % 150 mL chemo infusion  5,000 mg Intravenous 1 day or 1 dose Charlaine Dalton R, MD      . leucovorin 800 mg in dextrose 5 % 250 mL infusion  800 mg Intravenous Once Cammie Sickle, MD 145 mL/hr at 01/29/20 1131 800 mg at 01/29/20 1131  . oxaliplatin (ELOXATIN) 180 mg in dextrose 5 % 500 mL chemo infusion  85 mg/m2 (Treatment Plan Recorded) Intravenous Once Cammie Sickle, MD 268 mL/hr at 01/29/20 1132 180 mg at 01/29/20 1132    VITAL SIGNS: There were no vitals taken for this visit. There were no vitals filed for this visit.  Estimated body mass index is 25.77 kg/m as calculated from the following:   Height as of an earlier encounter on 01/29/20: 6' (1.829 m).   Weight as of an earlier encounter on 01/29/20: 190 lb (86.2 kg).  LABS: CBC:    Component Value Date/Time   WBC 4.3 01/29/2020 0912   HGB 13.1 01/29/2020 0912  HGB 13.3 09/18/2013 0424   HCT 39.1 01/29/2020 0912   HCT 38.1 (L) 09/18/2013 0424   PLT 128 (L) 01/29/2020 0912   PLT 307 09/18/2013 0424   MCV 99.0 01/29/2020 0912   MCV 94 09/18/2013 0424   NEUTROABS 2.0 01/29/2020 0912   NEUTROABS 17.0 (H) 09/18/2013 0424   LYMPHSABS 1.3 01/29/2020 0912   LYMPHSABS 0.9 (L) 09/18/2013 0424   MONOABS 0.4 01/29/2020 0912   MONOABS 0.4 09/18/2013 0424   EOSABS 0.5 01/29/2020 0912   EOSABS 0.0 09/18/2013 0424   BASOSABS 0.0 01/29/2020 0912   BASOSABS 0.0 09/18/2013 0424   Comprehensive Metabolic Panel:    Component Value Date/Time   NA 140 01/29/2020 0912   NA 137 09/18/2013 0424   K 3.8 01/29/2020 0912   K 4.5  09/18/2013 0424   CL 109 01/29/2020 0912   CL 107 09/18/2013 0424   CO2 25 01/29/2020 0912   CO2 28 09/18/2013 0424   BUN 10 01/29/2020 0912   BUN 19 (H) 09/18/2013 0424   CREATININE 0.72 01/29/2020 0912   CREATININE 1.15 09/18/2013 0424   GLUCOSE 168 (H) 01/29/2020 0912   GLUCOSE 196 (H) 09/18/2013 0424   CALCIUM 8.3 (L) 01/29/2020 0912   CALCIUM 9.1 09/18/2013 0424   AST 29 01/29/2020 0912   ALT 17 01/29/2020 0912   ALKPHOS 100 01/29/2020 0912   BILITOT 0.8 01/29/2020 0912   PROT 6.4 (L) 01/29/2020 0912   ALBUMIN 3.2 (L) 01/29/2020 0912    RADIOGRAPHIC STUDIES: CT ABDOMEN PELVIS W CONTRAST  Result Date: 01/12/2020 CLINICAL DATA:  Colon cancer with liver metastases, on chemotherapy. Status post right hemicolectomy. EXAM: CT ABDOMEN AND PELVIS WITH CONTRAST TECHNIQUE: Multidetector CT imaging of the abdomen and pelvis was performed using the standard protocol following bolus administration of intravenous contrast. CONTRAST:  142m OMNIPAQUE IOHEXOL 300 MG/ML  SOLN COMPARISON:  10/10/2019 FINDINGS: Lower chest: 4 mm subpleural nodule in the left lower lobe (series 5/image 7), previously 3 mm. Pulmonary metastasis is not excluded. Hepatobiliary: Cystic lesion in segment 8 measures 6.2 x 7.3 cm (series 2/image 14), previously 6.4 x 6.9 cm, grossly unchanged. 3.5 x 3.9 cm hypoenhancing lesion in segment 4A (series 2/image 19), previously 3.9 x 4.6 cm, mildly improved. 11 mm hypoenhancing lesion inferiorly in segment 6 (series 2/image 29), previously 11 x 12 mm, grossly unchanged. Gallbladder is notable for a tiny layering gallstone (series 2/image 28), without associated inflammatory changes. No intrahepatic or extrahepatic ductal dilatation. Pancreas: Within normal limits. Spleen: Within normal limits. Adrenals/Urinary Tract: Adrenal glands are within normal limits. 4.4 cm left lower pole renal cyst. Right kidney is within normal limits. No hydronephrosis. Bladder is mildly thick-walled although  underdistended. Stomach/Bowel: Stomach is within normal limits. Status post right hemicolectomy with appendectomy. 12 x 15 mm soft tissue lesion at the surgical margin (series 2/image 47), previously 9 x 13 mm when measured in a similar fashion on the prior, suspicious for possible suture line recurrence. Vascular/Lymphatic: No evidence of abdominal aortic aneurysm. Atherosclerotic calcifications of the abdominal aorta and branch vessels. No suspicious abdominopelvic lymphadenopathy. Reproductive: Prostate is unremarkable. Other: No abdominopelvic ascites. Musculoskeletal: Degenerative changes of the visualized thoracolumbar spine. IMPRESSION: Status post right hemicolectomy. 15 mm soft tissue nodule at the surgical margin, minimally progressive, suspicious for suture line recurrence. Hepatic metastases are stable versus mildly improved. 4 mm left lower lobe nodule, slightly more conspicuous than on the prior, metastasis not excluded. Electronically Signed   By: SJulian HyM.D.   On:  01/12/2020 10:17    PERFORMANCE STATUS (ECOG) : 1 - Symptomatic but completely ambulatory  Review of Systems Unless otherwise noted, a complete review of systems is negative.  Physical Exam General: NAD, frail appearing, thin Pulmonary: Unlabored Extremities: no edema, no joint deformities Skin: no rashes Neurological: Weakness but otherwise nonfocal  IMPRESSION: Routine follow-up visit today.  Patient was in the infusion area.  Patient reports he is doing well.  He denies any significant changes or concerns.  No distressing symptoms reported today.  Patient reports that overall he feels he is tolerating treatment well.  Performance status stable.  He says that he has been golfing and playing poker recently.  No issues with medications.  Patient did not require any refills today.   PLAN: -Continue current scope of treatment -Follow-up virtual visit 1 to 2 months   Patient expressed understanding and  was in agreement with this plan. He also understands that He can call the clinic at any time with any questions, concerns, or complaints.     Time Total: 15 minutes  Visit consisted of counseling and education dealing with the complex and emotionally intense issues of symptom management and palliative care in the setting of serious and potentially life-threatening illness.Greater than 50%  of this time was spent counseling and coordinating care related to the above assessment and plan.  Signed by: Altha Harm, PhD, NP-C

## 2020-01-29 NOTE — Progress Notes (Signed)
[place] NOTE  Patient Care Team: Sofie Hartigan, MD as PCP - General (Family Medicine) [name], MD as Consulting Physician (Hematology and Oncology)  CHIEF COMPLAINTS/PURPOSE OF CONSULTATION: Colon cancer  #  Oncology History Overview Note  # MAY 2020- 3. 03/09/19 Liver biopsy. Microscopic examination shows malignant cells with glandular architecture consistent with adenocarcinoma. The malignant cells are positive for CK20 and CDX-2. These findings support the clinical impression of metastatic colon adenocarcinoma. 4. 03/10/19 R hemicolectomy. Tumor site cecum. Adenocarcinoma. Mucinous features present. G2. No tumor deposits. Invades visceral peritoneum. No tumor perforation. LVI present. PNI not identified. All margins uninvolved. 1/12 LNs. PT4apN1. Periappendiceal inflammation c/w resolving abscess. Microsatellite stable (MSS). [Dr.Mettu; [facility]]  # SEP 4th 2020 [compared to May 2020]  Interval increase in size of the metastases to the hepatic dome, The metastasis to the left hepatic lobe is unchanged; 2.  New subcentimeter hypoattenuating lesion in the inferior right hepatic lobe, incompletely characterized on CT. 3.  Postsurgical changes following right hemicolectomy.  # OCT 2020- FOLFOX +avastin; CT dec 22nd 2020- [compared to [facility] sep 9th 2020]-Liver- slight progression versus stable disease.  # NGS/F-ONE-MUTATED K-RAS [G]  # PALLIATIVE CARE EVALUATION: 09/20/2019-Oscar [name]  # PAIN MANAGEMENT: NA   DIAGNOSIS: COLON CANCER  STAGE:  IV     ;  GOALS:Palliative  CURRENT/MOST RECENT THERAPY : FOLFOX+ avastin [C]    Cancer of right colon ([place])  07/05/2019 Initial Diagnosis   Cancer of right colon ([place])   07/24/2019 -  Chemotherapy   The patient had palonosetron (ALOXI) injection 0.25 mg, 0.25 mg, Intravenous,  Once, 13 of 16 cycles Administration: 0.25 mg (07/24/2019), 0.25 mg (08/07/2019), 0.25 mg (08/21/2019), 0.25 mg (09/04/2019), 0.25 mg (09/20/2019), 0.25 mg  (10/04/2019), 0.25 mg (10/16/2019), 0.25 mg (10/30/2019), 0.25 mg (11/22/2019), 0.25 mg (12/06/2019), 0.25 mg (12/20/2019), 0.25 mg (01/03/2020) leucovorin 800 mg in dextrose 5 % 250 mL infusion, 844 mg, Intravenous,  Once, 13 of 16 cycles Administration: 800 mg (07/24/2019), 800 mg (08/07/2019), 800 mg (08/21/2019), 800 mg (09/04/2019), 800 mg (09/20/2019), 800 mg (10/04/2019), 800 mg (10/16/2019), 800 mg (10/30/2019), 800 mg (11/22/2019), 800 mg (12/06/2019), 800 mg (12/20/2019), 800 mg (01/03/2020) oxaliplatin (ELOXATIN) 180 mg in dextrose 5 % 500 mL chemo infusion, 85 mg/m2 = 180 mg, Intravenous,  Once, 13 of 16 cycles Administration: 180 mg (07/24/2019), 180 mg (08/07/2019), 180 mg (08/21/2019), 180 mg (09/04/2019), 180 mg (09/20/2019), 180 mg (10/04/2019), 180 mg (10/16/2019), 180 mg (10/30/2019), 180 mg (11/22/2019), 180 mg (12/06/2019), 180 mg (12/20/2019), 180 mg (01/03/2020) fluorouracil (ADRUCIL) 5,000 mg in sodium chloride 0.9 % 150 mL chemo infusion, 5,050 mg, Intravenous, 1 Day/Dose, 13 of 16 cycles Administration: 5,000 mg (07/24/2019), 5,000 mg (08/07/2019), 5,000 mg (08/21/2019), 5,050 mg (09/04/2019), 5,000 mg (10/04/2019), 5,000 mg (10/16/2019), 5,000 mg (11/22/2019), 5,000 mg (12/06/2019), 5,000 mg (12/20/2019), 5,000 mg (01/03/2020) bevacizumab-bvzr (ZIRABEV) 400 mg in sodium chloride 0.9 % 100 mL chemo infusion, 5 mg/kg = 400 mg, Intravenous,  Once, 13 of 16 cycles Administration: 400 mg (08/07/2019), 400 mg (08/21/2019), 400 mg (09/04/2019), 400 mg (09/20/2019), 400 mg (10/04/2019), 400 mg (10/16/2019), 400 mg (10/30/2019), 400 mg (11/22/2019), 400 mg (12/06/2019), 400 mg (12/20/2019), 400 mg (01/03/2020)  for chemotherapy treatment.      HISTORY OF PRESENTING ILLNESS:  Oscar [name] 79 y.o.  male with metastatic colon cancer to the liver currently on FOLFOX plus Avastin is here for follow-up.  Patient states that he was unable to keep up with appointments last week because  of lack of ride.  His family had gone to  [place].  Patient takes care of his wife has dementia.  Patient denies any nausea vomiting abdominal pain.  Appetite is fair.  No weight loss.  Mild tingling and numbness.  Review of Systems  Constitutional: Positive for malaise/fatigue. Negative for chills, diaphoresis, fever and weight loss.  HENT: Negative for nosebleeds and sore throat.   Eyes: Negative for double vision.  Respiratory: Negative for cough, hemoptysis, sputum production, shortness of breath and wheezing.   Cardiovascular: Negative for chest pain, palpitations, orthopnea and leg swelling.  Gastrointestinal: Negative for abdominal pain, blood in stool, constipation, diarrhea, heartburn, melena, nausea and vomiting.  Genitourinary: Negative for dysuria, frequency and urgency.  Musculoskeletal: Positive for back pain and joint pain.  Skin: Negative.  Negative for itching and rash.  Neurological: Positive for tingling. Negative for dizziness, focal weakness, weakness and headaches.  Endo/Heme/Allergies: Does not bruise/bleed easily.  Psychiatric/Behavioral: Negative for depression. The patient is not nervous/anxious and does not have insomnia.      MEDICAL HISTORY:  Past Medical History:  Diagnosis Date  . Arthritis   . Cancer ([place])    colon cancer 02/2019 per pt   . Diabetes mellitus without complication ([place])   . H/O colon cancer, stage IV   . Hyperlipemia   . Hypertension     SURGICAL HISTORY: Past Surgical History:  Procedure Laterality Date  . IR IMAGING GUIDED PORT INSERTION  07/20/2019  . JOINT REPLACEMENT      SOCIAL HISTORY: Social History   Socioeconomic History  . Marital status: Married    Spouse name: Not on file  . Number of children: Not on file  . Years of education: Not on file  . Highest education level: Not on file  Occupational History  . Not on file  Tobacco Use  . Smoking status: Current Every Day Smoker    Packs/day: 0.25    Types: Cigarettes  . Smokeless tobacco: Never Used   . Tobacco comment: 1 to 2 cigarettes a day occasionally  Substance and Sexual Activity  . Alcohol use: No  . Drug use: No  . Sexual activity: Not on file  Other Topics Concern  . Not on file  Social History Narrative   Recruitment consultant retd; lives in [place]; smoking 3cig/day; [3/4 ppd x started at 9 years]; no alcohol. Son & daughter; wife dementia [waiting for placement].    Social Determinants of Health   Financial Resource Strain:   . Difficulty of Paying Living Expenses:   Food Insecurity:   . Worried About Charity fundraiser in the Last Year:   . Arboriculturist in the Last Year:   Transportation Needs:   . Film/video editor (Medical):   Marland Kitchen Lack of Transportation (Non-Medical):   Physical Activity:   . Days of Exercise per Week:   . Minutes of Exercise per Session:   Stress:   . Feeling of Stress :   Social Connections:   . Frequency of Communication with Friends and Family:   . Frequency of Social Gatherings with Friends and Family:   . Attends Religious Services:   . Active Member of Clubs or Organizations:   . Attends Archivist Meetings:   Marland Kitchen Marital Status:   Intimate Partner Violence:   . Fear of Current or Ex-Partner:   . Emotionally Abused:   Marland Kitchen Physically Abused:   . Sexually Abused:     FAMILY HISTORY: Family History  Problem Relation Age of Onset  . Peptic Ulcer Disease Father     ALLERGIES:  is allergic to ace inhibitors.  MEDICATIONS:  Current Outpatient Medications  Medication Sig Dispense Refill  . amLODipine (NORVASC) 5 MG tablet Take 5 mg by mouth daily.    . fluticasone (FLONASE) 50 MCG/ACT nasal spray Place 1 spray into both nostrils daily as needed.     . lidocaine-prilocaine (EMLA) cream Apply 1 application topically as needed. (Patient not taking: Reported on 12/05/2019) 30 g 0  . meloxicam (MOBIC) 15 MG tablet Take 1 tablet (15 mg total) by mouth daily. 90 tablet 0  . ondansetron (ZOFRAN-ODT) 4 MG  disintegrating tablet Take 1 tablet (4 mg total) by mouth every 6 (six) hours as needed for nausea. (Patient not taking: Reported on 12/05/2019) 20 tablet 0  . pravastatin (PRAVACHOL) 40 MG tablet Take 40 mg by mouth daily.    . prochlorperazine (COMPAZINE) 10 MG tablet Take 1 tablet (10 mg total) by mouth every 6 (six) hours as needed for nausea or vomiting. (Patient not taking: Reported on 12/05/2019) 40 tablet 1  . tamsulosin (FLOMAX) 0.4 MG CAPS capsule Take 1 capsule by mouth daily.     No current facility-administered medications for this visit.   Facility-Administered Medications Ordered in Other Visits  Medication Dose Route Frequency Provider Last Rate Last Admin  . fluorouracil (ADRUCIL) 5,000 mg in sodium chloride 0.9 % 150 mL chemo infusion  5,000 mg Intravenous 1 day or 1 dose Charlaine Dalton R, MD      . leucovorin 800 mg in dextrose 5 % 250 mL infusion  800 mg Intravenous Once Cammie Sickle, MD 145 mL/hr at 01/29/20 1131 800 mg at 01/29/20 1131  . oxaliplatin (ELOXATIN) 180 mg in dextrose 5 % 500 mL chemo infusion  85 mg/m2 (Treatment Plan Recorded) Intravenous Once Cammie Sickle, MD 268 mL/hr at 01/29/20 1132 180 mg at 01/29/20 1132      .  PHYSICAL EXAMINATION: ECOG PERFORMANCE STATUS: 0 - Asymptomatic  Vitals:   01/29/20 0928  BP: (!) 155/84  Pulse: 69  Resp: 20  Temp: (!) 96.3 F (35.7 C)   Filed Weights   01/29/20 0928  Weight: 190 lb (86.2 kg)    Physical Exam  Constitutional: He is oriented to person, place, and time and well-developed, well-nourished, and in no distress.  HENT:  Head: Normocephalic and atraumatic.  Mouth/Throat: Oropharynx is clear and moist. No oropharyngeal exudate.  Eyes: Pupils are equal, round, and reactive to light.  Cardiovascular: Normal rate and regular rhythm.  Pulmonary/Chest: No respiratory distress. He has no wheezes.  Abdominal: Soft. Bowel sounds are normal. He exhibits no distension and no mass. There  is no abdominal tenderness. There is no rebound and no guarding.  Musculoskeletal:        General: No tenderness or edema. Normal range of motion.     Cervical back: Normal range of motion and neck supple.  Neurological: He is alert and oriented to person, place, and time.  Skin: Skin is warm.  Psychiatric: Affect normal.   LABORATORY DATA:  I have reviewed the data as listed Lab Results  Component Value Date   WBC 4.3 01/29/2020   HGB 13.1 01/29/2020   HCT 39.1 01/29/2020   MCV 99.0 01/29/2020   PLT 128 (L) 01/29/2020   Recent Labs    12/20/19 0849 01/03/20 0803 01/29/20 0912  NA 140 140 140  K 3.7 3.5 3.8  CL 110 109 109  CO2 '24 24 25  '$ GLUCOSE 155* 144* 168*  BUN '11 11 10  '$ CREATININE 0.82 0.70 0.72  CALCIUM 8.5* 8.4* 8.3*  GFRNONAA >60 >60 >60  GFRAA >60 >60 >60  PROT 6.7 6.7 6.4*  ALBUMIN 3.3* 3.4* 3.2*  AST '26 27 29  '$ ALT '18 19 17  '$ ALKPHOS 106 112 100  BILITOT 0.5 0.6 0.8    RADIOGRAPHIC STUDIES: I have personally reviewed the radiological images as listed and agreed with the findings in the report. CT ABDOMEN PELVIS W CONTRAST  Result Date: 01/12/2020 CLINICAL DATA:  Colon cancer with liver metastases, on chemotherapy. Status post right hemicolectomy. EXAM: CT ABDOMEN AND PELVIS WITH CONTRAST TECHNIQUE: Multidetector CT imaging of the abdomen and pelvis was performed using the standard protocol following bolus administration of intravenous contrast. CONTRAST:  123m OMNIPAQUE IOHEXOL 300 MG/ML  SOLN COMPARISON:  10/10/2019 FINDINGS: Lower chest: 4 mm subpleural nodule in the left lower lobe (series 5/image 7), previously 3 mm. Pulmonary metastasis is not excluded. Hepatobiliary: Cystic lesion in segment 8 measures 6.2 x 7.3 cm (series 2/image 14), previously 6.4 x 6.9 cm, grossly unchanged. 3.5 x 3.9 cm hypoenhancing lesion in segment 4A (series 2/image 19), previously 3.9 x 4.6 cm, mildly improved. 11 mm hypoenhancing lesion inferiorly in segment 6 (series 2/image  29), previously 11 x 12 mm, grossly unchanged. Gallbladder is notable for a tiny layering gallstone (series 2/image 28), without associated inflammatory changes. No intrahepatic or extrahepatic ductal dilatation. Pancreas: Within normal limits. Spleen: Within normal limits. Adrenals/Urinary Tract: Adrenal glands are within normal limits. 4.4 cm left lower pole renal cyst. Right kidney is within normal limits. No hydronephrosis. Bladder is mildly thick-walled although underdistended. Stomach/Bowel: Stomach is within normal limits. Status post right hemicolectomy with appendectomy. 12 x 15 mm soft tissue lesion at the surgical margin (series 2/image 47), previously 9 x 13 mm when measured in a similar fashion on the prior, suspicious for possible suture line recurrence. Vascular/Lymphatic: No evidence of abdominal aortic aneurysm. Atherosclerotic calcifications of the abdominal aorta and branch vessels. No suspicious abdominopelvic lymphadenopathy. Reproductive: Prostate is unremarkable. Other: No abdominopelvic ascites. Musculoskeletal: Degenerative changes of the visualized thoracolumbar spine. IMPRESSION: Status post right hemicolectomy. 15 mm soft tissue nodule at the surgical margin, minimally progressive, suspicious for suture line recurrence. Hepatic metastases are stable versus mildly improved. 4 mm left lower lobe nodule, slightly more conspicuous than on the prior, metastasis not excluded. Electronically Signed   By: SJulian HyM.D.   On: 01/12/2020 10:17    ASSESSMENT & PLAN:   [place] #Right-sided colon adenocarcinoma-with synchronous metastasis to liver/unresectable.  Currently on FOLFOX plus Avastin; CT MARCH 27th CT- overall stable/liver lesions; ~165mnodule/slight progression of suture lesoins 53m41mL nodule. CEA-overall STABLE.   #Proceed with today- cycle 13 of FOLFOX plus Avastin chemotherapy.  Labs today reviewed;  acceptable for treatment today.  Continue current  therapy at this time.  Again reviewed that treatments are indefinite.  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 STABLE.  # HTN- systolic 160009Qable. Recommend monitoring at home; birng a log. monitor closely.  # DISPOSITION: #FOLFOX+ Avastin today # follow up in 2 weeks- MD; labs- cbc/cmp/UA/ CEA; FOLFOX + avastin;pump off in 2 days;Dr.B  All questions were answered. The patient knows to call the clinic with any problems, questions or concerns.    GovCammie SickleD 01/29/2020 11:52 AM

## 2020-01-29 NOTE — Assessment & Plan Note (Addendum)
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  Currently on FOLFOX plus Avastin; CT MARCH 27th CT- overall stable/liver lesions; ~46mm nodule/slight progression of suture lesoins 54mm LL nodule. CEA-overall STABLE.   #Proceed with today- cycle 13 of FOLFOX plus Avastin chemotherapy.  Labs today reviewed;  acceptable for treatment today.  Continue current therapy at this time.  Again reviewed that treatments are indefinite.  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 STABLE.  # HTN- systolic 123456 stable. Recommend monitoring at home; birng a log. monitor closely.  # DISPOSITION: #FOLFOX+ Avastin today # follow up in 2 weeks- MD; labs- cbc/cmp/UA/ CEA; FOLFOX + avastin;pump off in 2 days;Dr.B

## 2020-01-30 LAB — CEA: CEA: 22.9 ng/mL — ABNORMAL HIGH (ref 0.0–4.7)

## 2020-01-31 ENCOUNTER — Inpatient Hospital Stay: Payer: Medicare HMO

## 2020-01-31 ENCOUNTER — Other Ambulatory Visit: Payer: Self-pay

## 2020-01-31 VITALS — BP 159/86 | HR 69 | Temp 97.0°F | Resp 20

## 2020-01-31 DIAGNOSIS — C182 Malignant neoplasm of ascending colon: Secondary | ICD-10-CM | POA: Diagnosis not present

## 2020-01-31 DIAGNOSIS — Z5111 Encounter for antineoplastic chemotherapy: Secondary | ICD-10-CM | POA: Diagnosis not present

## 2020-01-31 DIAGNOSIS — Z5112 Encounter for antineoplastic immunotherapy: Secondary | ICD-10-CM | POA: Diagnosis not present

## 2020-01-31 DIAGNOSIS — C787 Secondary malignant neoplasm of liver and intrahepatic bile duct: Secondary | ICD-10-CM | POA: Diagnosis not present

## 2020-01-31 DIAGNOSIS — Z7189 Other specified counseling: Secondary | ICD-10-CM

## 2020-01-31 MED ORDER — HEPARIN SOD (PORK) LOCK FLUSH 100 UNIT/ML IV SOLN
500.0000 [IU] | Freq: Once | INTRAVENOUS | Status: AC | PRN
  Administered 2020-01-31: 500 [IU]
  Filled 2020-01-31: qty 5

## 2020-01-31 MED ORDER — HEPARIN SOD (PORK) LOCK FLUSH 100 UNIT/ML IV SOLN
INTRAVENOUS | Status: AC
  Filled 2020-01-31: qty 5

## 2020-01-31 MED ORDER — SODIUM CHLORIDE 0.9% FLUSH
10.0000 mL | INTRAVENOUS | Status: DC | PRN
  Administered 2020-01-31: 10 mL
  Filled 2020-01-31: qty 10

## 2020-02-05 NOTE — Progress Notes (Signed)

## 2020-02-12 ENCOUNTER — Inpatient Hospital Stay: Payer: Medicare HMO

## 2020-02-12 ENCOUNTER — Inpatient Hospital Stay: Payer: Medicare HMO | Admitting: Internal Medicine

## 2020-02-12 ENCOUNTER — Other Ambulatory Visit: Payer: Self-pay

## 2020-02-12 DIAGNOSIS — C182 Malignant neoplasm of ascending colon: Secondary | ICD-10-CM

## 2020-02-12 DIAGNOSIS — Z7189 Other specified counseling: Secondary | ICD-10-CM

## 2020-02-12 DIAGNOSIS — Z5111 Encounter for antineoplastic chemotherapy: Secondary | ICD-10-CM | POA: Diagnosis not present

## 2020-02-12 DIAGNOSIS — C787 Secondary malignant neoplasm of liver and intrahepatic bile duct: Secondary | ICD-10-CM | POA: Diagnosis not present

## 2020-02-12 DIAGNOSIS — Z95828 Presence of other vascular implants and grafts: Secondary | ICD-10-CM

## 2020-02-12 DIAGNOSIS — Z5112 Encounter for antineoplastic immunotherapy: Secondary | ICD-10-CM | POA: Diagnosis not present

## 2020-02-12 LAB — CBC WITH DIFFERENTIAL/PLATELET
Abs Immature Granulocytes: 0.01 10*3/uL (ref 0.00–0.07)
Basophils Absolute: 0 10*3/uL (ref 0.0–0.1)
Basophils Relative: 1 %
Eosinophils Absolute: 0.5 10*3/uL (ref 0.0–0.5)
Eosinophils Relative: 10 %
HCT: 39.4 % (ref 39.0–52.0)
Hemoglobin: 13.4 g/dL (ref 13.0–17.0)
Immature Granulocytes: 0 %
Lymphocytes Relative: 20 %
Lymphs Abs: 1 10*3/uL (ref 0.7–4.0)
MCH: 33.1 pg (ref 26.0–34.0)
MCHC: 34 g/dL (ref 30.0–36.0)
MCV: 97.3 fL (ref 80.0–100.0)
Monocytes Absolute: 0.7 10*3/uL (ref 0.1–1.0)
Monocytes Relative: 14 %
Neutro Abs: 2.8 10*3/uL (ref 1.7–7.7)
Neutrophils Relative %: 55 %
Platelets: 108 10*3/uL — ABNORMAL LOW (ref 150–400)
RBC: 4.05 MIL/uL — ABNORMAL LOW (ref 4.22–5.81)
RDW: 13.8 % (ref 11.5–15.5)
WBC: 5.2 10*3/uL (ref 4.0–10.5)
nRBC: 0 % (ref 0.0–0.2)

## 2020-02-12 LAB — URINALYSIS, COMPLETE (UACMP) WITH MICROSCOPIC
Bacteria, UA: NONE SEEN
Bilirubin Urine: NEGATIVE
Glucose, UA: NEGATIVE mg/dL
Hgb urine dipstick: NEGATIVE
Ketones, ur: NEGATIVE mg/dL
Leukocytes,Ua: NEGATIVE
Nitrite: NEGATIVE
Protein, ur: NEGATIVE mg/dL
Specific Gravity, Urine: 1.02 (ref 1.005–1.030)
pH: 7 (ref 5.0–8.0)

## 2020-02-12 LAB — COMPREHENSIVE METABOLIC PANEL
ALT: 18 U/L (ref 0–44)
AST: 28 U/L (ref 15–41)
Albumin: 3.4 g/dL — ABNORMAL LOW (ref 3.5–5.0)
Alkaline Phosphatase: 117 U/L (ref 38–126)
Anion gap: 8 (ref 5–15)
BUN: 8 mg/dL (ref 8–23)
CO2: 25 mmol/L (ref 22–32)
Calcium: 8.3 mg/dL — ABNORMAL LOW (ref 8.9–10.3)
Chloride: 106 mmol/L (ref 98–111)
Creatinine, Ser: 0.71 mg/dL (ref 0.61–1.24)
GFR calc Af Amer: >60 mL/min (ref >60)
GFR calc non Af Amer: >60 mL/min (ref >60)
Glucose, Bld: 155 mg/dL — ABNORMAL HIGH (ref 70–99)
Potassium: 3.7 mmol/L (ref 3.5–5.1)
Sodium: 139 mmol/L (ref 135–145)
Total Bilirubin: 0.8 mg/dL (ref 0.3–1.2)
Total Protein: 6.9 g/dL (ref 6.5–8.1)

## 2020-02-12 MED ORDER — DEXAMETHASONE SODIUM PHOSPHATE 10 MG/ML IJ SOLN
10.0000 mg | Freq: Once | INTRAMUSCULAR | Status: AC
  Administered 2020-02-12: 10 mg via INTRAVENOUS
  Filled 2020-02-12: qty 1

## 2020-02-12 MED ORDER — SODIUM CHLORIDE 0.9 % IV SOLN
INTRAVENOUS | Status: DC
  Filled 2020-02-12: qty 250

## 2020-02-12 MED ORDER — DEXTROSE 5 % IV SOLN
Freq: Once | INTRAVENOUS | Status: AC
  Filled 2020-02-12: qty 250

## 2020-02-12 MED ORDER — OXALIPLATIN CHEMO INJECTION 100 MG/20ML
85.0000 mg/m2 | Freq: Once | INTRAVENOUS | Status: AC
  Administered 2020-02-12: 180 mg via INTRAVENOUS
  Filled 2020-02-12: qty 36

## 2020-02-12 MED ORDER — MONTELUKAST SODIUM 10 MG PO TABS
10.0000 mg | ORAL_TABLET | Freq: Every day | ORAL | 1 refills | Status: DC
Start: 2020-02-12 — End: 2020-03-06

## 2020-02-12 MED ORDER — SODIUM CHLORIDE 0.9 % IV SOLN
5.0000 mg/kg | Freq: Once | INTRAVENOUS | Status: AC
  Administered 2020-02-12: 400 mg via INTRAVENOUS
  Filled 2020-02-12: qty 16

## 2020-02-12 MED ORDER — HEPARIN SOD (PORK) LOCK FLUSH 100 UNIT/ML IV SOLN
500.0000 [IU] | Freq: Once | INTRAVENOUS | Status: DC
  Filled 2020-02-12: qty 5

## 2020-02-12 MED ORDER — PALONOSETRON HCL INJECTION 0.25 MG/5ML
0.2500 mg | Freq: Once | INTRAVENOUS | Status: AC
  Administered 2020-02-12: 0.25 mg via INTRAVENOUS
  Filled 2020-02-12: qty 5

## 2020-02-12 MED ORDER — LEUCOVORIN CALCIUM INJECTION 350 MG
800.0000 mg | Freq: Once | INTRAVENOUS | Status: AC
  Administered 2020-02-12: 800 mg via INTRAVENOUS
  Filled 2020-02-12: qty 40

## 2020-02-12 MED ORDER — SODIUM CHLORIDE 0.9% FLUSH
10.0000 mL | Freq: Once | INTRAVENOUS | Status: AC
  Administered 2020-02-12: 10 mL via INTRAVENOUS
  Filled 2020-02-12: qty 10

## 2020-02-12 MED ORDER — SODIUM CHLORIDE 0.9 % IV SOLN
5000.0000 mg | INTRAVENOUS | Status: DC
  Administered 2020-02-12: 5000 mg via INTRAVENOUS
  Filled 2020-02-12: qty 100

## 2020-02-12 NOTE — Assessment & Plan Note (Addendum)
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  Currently on FOLFOX plus Avastin; CT MARCH 27th CT- overall stable/liver lesions; ~61mm nodule/slight progression of suture lesoins 73mm LL nodule. CEA-slightly rising around 20.  #Proceed with today- cycle 14 of FOLFOX plus Avastin chemotherapy.  Labs today reviewed;  acceptable for treatment today.  Discussed that will repeat PET scan sooner in 2 months if CEA rising.   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 STABLE.  # HTN- systolic 0000000 stable. Recommend monitoring at home; birng a log. monitor closely.  # Seasonal allergies- start singulair. New script given.   # DISPOSITION: #FOLFOX+ Avastin today # follow up in 2 weeks- MD; labs- cbc/cmp/UA/ CEA; FOLFOX + avastin;pump off in 2 days;Dr.B

## 2020-02-13 LAB — CEA: CEA: 24.2 ng/mL — ABNORMAL HIGH (ref 0.0–4.7)

## 2020-02-14 ENCOUNTER — Inpatient Hospital Stay: Payer: Medicare HMO

## 2020-02-14 ENCOUNTER — Other Ambulatory Visit: Payer: Self-pay

## 2020-02-14 VITALS — BP 124/86 | HR 76 | Temp 97.8°F | Resp 18

## 2020-02-14 DIAGNOSIS — C182 Malignant neoplasm of ascending colon: Secondary | ICD-10-CM

## 2020-02-14 DIAGNOSIS — Z5112 Encounter for antineoplastic immunotherapy: Secondary | ICD-10-CM | POA: Diagnosis not present

## 2020-02-14 DIAGNOSIS — C787 Secondary malignant neoplasm of liver and intrahepatic bile duct: Secondary | ICD-10-CM | POA: Diagnosis not present

## 2020-02-14 DIAGNOSIS — Z7189 Other specified counseling: Secondary | ICD-10-CM

## 2020-02-14 DIAGNOSIS — Z5111 Encounter for antineoplastic chemotherapy: Secondary | ICD-10-CM | POA: Diagnosis not present

## 2020-02-14 MED ORDER — SODIUM CHLORIDE 0.9% FLUSH
10.0000 mL | INTRAVENOUS | Status: DC | PRN
  Administered 2020-02-14: 13:00:00 10 mL
  Filled 2020-02-14: qty 10

## 2020-02-14 MED ORDER — HEPARIN SOD (PORK) LOCK FLUSH 100 UNIT/ML IV SOLN
500.0000 [IU] | Freq: Once | INTRAVENOUS | Status: AC | PRN
  Administered 2020-02-14: 500 [IU]
  Filled 2020-02-14: qty 5

## 2020-02-14 MED ORDER — HEPARIN SOD (PORK) LOCK FLUSH 100 UNIT/ML IV SOLN
INTRAVENOUS | Status: AC
  Filled 2020-02-14: qty 5

## 2020-02-18 NOTE — Progress Notes (Signed)
[place] NOTE  Patient Care Team: Sofie Hartigan, MD as PCP - General (Family Medicine) [name], MD as Consulting Physician (Hematology and Oncology)  CHIEF COMPLAINTS/PURPOSE OF CONSULTATION: Colon cancer  #  Oncology History Overview Note  # MAY 2020- 3. 03/09/19 Liver biopsy. Microscopic examination shows malignant cells with glandular architecture consistent with adenocarcinoma. The malignant cells are positive for CK20 and CDX-2. These findings support the clinical impression of metastatic colon adenocarcinoma. 4. 03/10/19 R hemicolectomy. Tumor site cecum. Adenocarcinoma. Mucinous features present. G2. No tumor deposits. Invades visceral peritoneum. No tumor perforation. LVI present. PNI not identified. All margins uninvolved. 1/12 LNs. PT4apN1. Periappendiceal inflammation c/w resolving abscess. Microsatellite stable (MSS). [Dr.Mettu; [facility]]  # SEP 4th 2020 [compared to May 2020]  Interval increase in size of the metastases to the hepatic dome, The metastasis to the left hepatic lobe is unchanged; 2.  New subcentimeter hypoattenuating lesion in the inferior right hepatic lobe, incompletely characterized on CT. 3.  Postsurgical changes following right hemicolectomy.  # OCT 2020- FOLFOX +avastin; CT dec 22nd 2020- [compared to [facility] sep 9th 2020]-Liver- slight progression versus stable disease.  # NGS/F-ONE-MUTATED K-RAS [G]  # PALLIATIVE CARE EVALUATION: 09/20/2019-Oscar [name]  # PAIN MANAGEMENT: NA   DIAGNOSIS: COLON CANCER  STAGE:  IV     ;  GOALS:Palliative  CURRENT/MOST RECENT THERAPY : FOLFOX+ avastin [C]    Cancer of right colon ([place])  07/05/2019 Initial Diagnosis   Cancer of right colon ([place])   07/24/2019 -  Chemotherapy   The patient had palonosetron (ALOXI) injection 0.25 mg, 0.25 mg, Intravenous,  Once, 14 of 16 cycles Administration: 0.25 mg (07/24/2019), 0.25 mg (08/07/2019), 0.25 mg (08/21/2019), 0.25 mg (09/04/2019), 0.25 mg (09/20/2019), 0.25 mg  (10/04/2019), 0.25 mg (10/16/2019), 0.25 mg (10/30/2019), 0.25 mg (11/22/2019), 0.25 mg (12/06/2019), 0.25 mg (12/20/2019), 0.25 mg (01/03/2020), 0.25 mg (01/29/2020), 0.25 mg (02/12/2020) leucovorin 800 mg in dextrose 5 % 250 mL infusion, 844 mg, Intravenous,  Once, 14 of 16 cycles Administration: 800 mg (07/24/2019), 800 mg (08/07/2019), 800 mg (08/21/2019), 800 mg (09/04/2019), 800 mg (09/20/2019), 800 mg (10/04/2019), 800 mg (10/16/2019), 800 mg (10/30/2019), 800 mg (11/22/2019), 800 mg (12/06/2019), 800 mg (12/20/2019), 800 mg (01/03/2020), 800 mg (01/29/2020), 800 mg (02/12/2020) oxaliplatin (ELOXATIN) 180 mg in dextrose 5 % 500 mL chemo infusion, 85 mg/m2 = 180 mg, Intravenous,  Once, 14 of 16 cycles Administration: 180 mg (07/24/2019), 180 mg (08/07/2019), 180 mg (08/21/2019), 180 mg (09/04/2019), 180 mg (09/20/2019), 180 mg (10/04/2019), 180 mg (10/16/2019), 180 mg (10/30/2019), 180 mg (11/22/2019), 180 mg (12/06/2019), 180 mg (12/20/2019), 180 mg (01/03/2020), 180 mg (01/29/2020), 180 mg (02/12/2020) fluorouracil (ADRUCIL) 5,000 mg in sodium chloride 0.9 % 150 mL chemo infusion, 5,050 mg, Intravenous, 1 Day/Dose, 14 of 16 cycles Administration: 5,000 mg (07/24/2019), 5,000 mg (08/07/2019), 5,000 mg (08/21/2019), 5,050 mg (09/04/2019), 5,000 mg (10/04/2019), 5,000 mg (10/16/2019), 5,000 mg (11/22/2019), 5,000 mg (12/06/2019), 5,000 mg (12/20/2019), 5,000 mg (01/03/2020), 5,000 mg (01/29/2020), 5,000 mg (02/12/2020) bevacizumab-bvzr (ZIRABEV) 400 mg in sodium chloride 0.9 % 100 mL chemo infusion, 5 mg/kg = 400 mg, Intravenous,  Once, 14 of 16 cycles Administration: 400 mg (08/07/2019), 400 mg (08/21/2019), 400 mg (09/04/2019), 400 mg (09/20/2019), 400 mg (10/04/2019), 400 mg (10/16/2019), 400 mg (10/30/2019), 400 mg (11/22/2019), 400 mg (12/06/2019), 400 mg (12/20/2019), 400 mg (01/03/2020), 400 mg (01/29/2020), 400 mg (02/12/2020)  for chemotherapy treatment.      HISTORY OF PRESENTING ILLNESS:  Oscar [name] 79 y.o.  male with  metastatic  colon cancer to the liver currently on FOLFOX plus Avastin is here for follow-up.  Patient complains of mild tingling and numbness in his extremities.  Not any worse.  Appetite is fair.  No weight loss.  No nausea no vomiting.  Review of Systems  Constitutional: Positive for malaise/fatigue. Negative for chills, diaphoresis, fever and weight loss.  HENT: Negative for nosebleeds and sore throat.   Eyes: Negative for double vision.  Respiratory: Negative for cough, hemoptysis, sputum production, shortness of breath and wheezing.   Cardiovascular: Negative for chest pain, palpitations, orthopnea and leg swelling.  Gastrointestinal: Negative for abdominal pain, blood in stool, constipation, diarrhea, heartburn, melena, nausea and vomiting.  Genitourinary: Negative for dysuria, frequency and urgency.  Musculoskeletal: Positive for back pain and joint pain.  Skin: Negative.  Negative for itching and rash.  Neurological: Positive for tingling. Negative for dizziness, focal weakness, weakness and headaches.  Endo/Heme/Allergies: Does not bruise/bleed easily.  Psychiatric/Behavioral: Negative for depression. The patient is not nervous/anxious and does not have insomnia.      MEDICAL HISTORY:  Past Medical History:  Diagnosis Date  . Arthritis   . Cancer ([place])    colon cancer 02/2019 per pt   . Diabetes mellitus without complication ([place])   . H/O colon cancer, stage IV   . Hyperlipemia   . Hypertension     SURGICAL HISTORY: Past Surgical History:  Procedure Laterality Date  . IR IMAGING GUIDED PORT INSERTION  07/20/2019  . JOINT REPLACEMENT      SOCIAL HISTORY: Social History   Socioeconomic History  . Marital status: Married    Spouse name: Not on file  . Number of children: Not on file  . Years of education: Not on file  . Highest education level: Not on file  Occupational History  . Not on file  Tobacco Use  . Smoking status: Current Every Day Smoker    Packs/day: 0.25     Types: Cigarettes  . Smokeless tobacco: Never Used  . Tobacco comment: 1 to 2 cigarettes a day occasionally  Substance and Sexual Activity  . Alcohol use: No  . Drug use: No  . Sexual activity: Not on file  Other Topics Concern  . Not on file  Social History Narrative   Recruitment consultant retd; lives in [place]; smoking 3cig/day; [3/4 ppd x started at 9 years]; no alcohol. Son & daughter; wife dementia [waiting for placement].    Social Determinants of Health   Financial Resource Strain:   . Difficulty of Paying Living Expenses:   Food Insecurity:   . Worried About Charity fundraiser in the Last Year:   . Arboriculturist in the Last Year:   Transportation Needs:   . Film/video editor (Medical):   Marland Kitchen Lack of Transportation (Non-Medical):   Physical Activity:   . Days of Exercise per Week:   . Minutes of Exercise per Session:   Stress:   . Feeling of Stress :   Social Connections:   . Frequency of Communication with Friends and Family:   . Frequency of Social Gatherings with Friends and Family:   . Attends Religious Services:   . Active Member of Clubs or Organizations:   . Attends Archivist Meetings:   Marland Kitchen Marital Status:   Intimate Partner Violence:   . Fear of Current or Ex-Partner:   . Emotionally Abused:   Marland Kitchen Physically Abused:   . Sexually Abused:     FAMILY HISTORY: Family  History  Problem Relation Age of Onset  . Peptic Ulcer Disease Father     ALLERGIES:  is allergic to ace inhibitors.  MEDICATIONS:  Current Outpatient Medications  Medication Sig Dispense Refill  . amLODipine (NORVASC) 5 MG tablet Take 5 mg by mouth daily.    . fluticasone (FLONASE) 50 MCG/ACT nasal spray Place 1 spray into both nostrils daily as needed.     . lidocaine-prilocaine (EMLA) cream Apply 1 application topically as needed. (Patient not taking: Reported on 12/05/2019) 30 g 0  . meloxicam (MOBIC) 15 MG tablet Take 1 tablet (15 mg total) by mouth daily. 90  tablet 0  . montelukast (SINGULAIR) 10 MG tablet Take 1 tablet (10 mg total) by mouth at bedtime. 30 tablet 1  . ondansetron (ZOFRAN-ODT) 4 MG disintegrating tablet Take 1 tablet (4 mg total) by mouth every 6 (six) hours as needed for nausea. (Patient not taking: Reported on 12/05/2019) 20 tablet 0  . pravastatin (PRAVACHOL) 40 MG tablet Take 40 mg by mouth daily.    . prochlorperazine (COMPAZINE) 10 MG tablet Take 1 tablet (10 mg total) by mouth every 6 (six) hours as needed for nausea or vomiting. (Patient not taking: Reported on 12/05/2019) 40 tablet 1  . tamsulosin (FLOMAX) 0.4 MG CAPS capsule Take 1 capsule by mouth daily.     No current facility-administered medications for this visit.      Marland Kitchen  PHYSICAL EXAMINATION: ECOG PERFORMANCE STATUS: 0 - Asymptomatic  Vitals:   02/12/20 0858  BP: (!) 146/82  Pulse: 71  Temp: (!) 97.1 F (36.2 C)   Filed Weights   02/12/20 0858  Weight: 192 lb (87.1 kg)    Physical Exam  Constitutional: He is oriented to person, place, and time and well-developed, well-nourished, and in no distress.  HENT:  Head: Normocephalic and atraumatic.  Mouth/Throat: Oropharynx is clear and moist. No oropharyngeal exudate.  Eyes: Pupils are equal, round, and reactive to light.  Cardiovascular: Normal rate and regular rhythm.  Pulmonary/Chest: No respiratory distress. He has no wheezes.  Abdominal: Soft. Bowel sounds are normal. He exhibits no distension and no mass. There is no abdominal tenderness. There is no rebound and no guarding.  Musculoskeletal:        General: No tenderness or edema. Normal range of motion.     Cervical back: Normal range of motion and neck supple.  Neurological: He is alert and oriented to person, place, and time.  Skin: Skin is warm.  Psychiatric: Affect normal.   LABORATORY DATA:  I have reviewed the data as listed Lab Results  Component Value Date   WBC 5.2 02/12/2020   HGB 13.4 02/12/2020   HCT 39.4 02/12/2020   MCV  97.3 02/12/2020   PLT 108 (L) 02/12/2020   Recent Labs    01/03/20 0803 01/29/20 0912 02/12/20 0841  NA 140 140 139  K 3.5 3.8 3.7  CL 109 109 106  CO2 _0 GLUCOSE 144* 168* 155*  BUN _1 CREATININE 0.70 0.72 0.71  CALCIUM 8.4* 8.3* 8.3*  GFRNONAA >60 >60 >60  GFRAA >60 >60 >60  PROT 6.7 6.4* 6.9  ALBUMIN 3.4* 3.2* 3.4*  AST _2 ALT _3 ALKPHOS 112 100 117  BILITOT 0.6 0.8 0.8    RADIOGRAPHIC STUDIES: I have personally reviewed the radiological images as listed and agreed with the findings in the report. No results found.  ASSESSMENT & PLAN:   Cancer of  right colon ([place]) #Right-sided colon adenocarcinoma-with synchronous metastasis to liver/unresectable.  Currently on FOLFOX plus Avastin; CT [facility]ARCH 27th CT- overall stable/liver lesions; ~74m nodule/slight progression of suture lesoins 465mLL nodule. CEA-slightly rising around 20.  #Proceed with today- cycle 14 of FOLFOX plus Avastin chemotherapy.  Labs today reviewed;  acceptable for treatment today.  Discussed that will repeat PET scan sooner in 2 months if CEA rising.   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 STABLE.  # HTN- systolic 14001Utable. Recommend monitoring at home; birng a log. monitor closely.  # Seasonal allergies- start singulair. New script given.   # DISPOSITION: #FOLFOX+ Avastin today # follow up in 2 weeks- MD; labs- cbc/cmp/UA/ CEA; FOLFOX + avastin;pump off in 2 days;Dr.B  All questions were answered. The patient knows to call the clinic with any problems, questions or concerns.    GoCammie SickleMD 02/18/2020 11:45 PM

## 2020-02-19 NOTE — Progress Notes (Signed)
Pharmacist Chemotherapy Monitoring - Follow Up Assessment    I verify that I have reviewed each item in the below checklist:  . Regimen for the patient is scheduled for the appropriate day and plan matches scheduled date. Marland Kitchen Appropriate non-routine labs are ordered dependent on drug ordered. . If applicable, additional medications reviewed and ordered per protocol based on lifetime cumulative doses and/or treatment regimen.   Plan for follow-up and/or issues identified: No . I-vent associated with next due treatment: No . MD and/or nursing notified: No  Shawnmichael Parenteau K 02/19/2020 8:45 AM

## 2020-02-21 DIAGNOSIS — C182 Malignant neoplasm of ascending colon: Secondary | ICD-10-CM | POA: Diagnosis not present

## 2020-02-26 ENCOUNTER — Other Ambulatory Visit: Payer: Self-pay

## 2020-02-26 ENCOUNTER — Inpatient Hospital Stay: Payer: Medicare HMO | Admitting: Internal Medicine

## 2020-02-26 ENCOUNTER — Inpatient Hospital Stay: Payer: Medicare HMO

## 2020-02-26 ENCOUNTER — Inpatient Hospital Stay: Payer: Medicare HMO | Attending: Oncology

## 2020-02-26 VITALS — Temp 97.5°F | Resp 18

## 2020-02-26 DIAGNOSIS — Z7189 Other specified counseling: Secondary | ICD-10-CM

## 2020-02-26 DIAGNOSIS — Z5112 Encounter for antineoplastic immunotherapy: Secondary | ICD-10-CM | POA: Insufficient documentation

## 2020-02-26 DIAGNOSIS — C182 Malignant neoplasm of ascending colon: Secondary | ICD-10-CM | POA: Diagnosis not present

## 2020-02-26 DIAGNOSIS — Z5111 Encounter for antineoplastic chemotherapy: Secondary | ICD-10-CM | POA: Insufficient documentation

## 2020-02-26 DIAGNOSIS — C787 Secondary malignant neoplasm of liver and intrahepatic bile duct: Secondary | ICD-10-CM | POA: Insufficient documentation

## 2020-02-26 DIAGNOSIS — Z95828 Presence of other vascular implants and grafts: Secondary | ICD-10-CM

## 2020-02-26 LAB — CBC WITH DIFFERENTIAL/PLATELET
Abs Immature Granulocytes: 0.01 10*3/uL (ref 0.00–0.07)
Basophils Absolute: 0 10*3/uL (ref 0.0–0.1)
Basophils Relative: 1 %
Eosinophils Absolute: 0.4 10*3/uL (ref 0.0–0.5)
Eosinophils Relative: 7 %
HCT: 37.2 % — ABNORMAL LOW (ref 39.0–52.0)
Hemoglobin: 13 g/dL (ref 13.0–17.0)
Immature Granulocytes: 0 %
Lymphocytes Relative: 24 %
Lymphs Abs: 1.2 10*3/uL (ref 0.7–4.0)
MCH: 34.1 pg — ABNORMAL HIGH (ref 26.0–34.0)
MCHC: 34.9 g/dL (ref 30.0–36.0)
MCV: 97.6 fL (ref 80.0–100.0)
Monocytes Absolute: 0.6 10*3/uL (ref 0.1–1.0)
Monocytes Relative: 13 %
Neutro Abs: 2.8 10*3/uL (ref 1.7–7.7)
Neutrophils Relative %: 55 %
Platelets: 112 10*3/uL — ABNORMAL LOW (ref 150–400)
RBC: 3.81 MIL/uL — ABNORMAL LOW (ref 4.22–5.81)
RDW: 14.2 % (ref 11.5–15.5)
WBC: 5.1 10*3/uL (ref 4.0–10.5)
nRBC: 0 % (ref 0.0–0.2)

## 2020-02-26 LAB — URINALYSIS, COMPLETE (UACMP) WITH MICROSCOPIC
Bacteria, UA: NONE SEEN
Bilirubin Urine: NEGATIVE
Glucose, UA: NEGATIVE mg/dL
Hgb urine dipstick: NEGATIVE
Ketones, ur: NEGATIVE mg/dL
Leukocytes,Ua: NEGATIVE
Nitrite: NEGATIVE
Protein, ur: 30 mg/dL — AB
Specific Gravity, Urine: 1.027 (ref 1.005–1.030)
pH: 5 (ref 5.0–8.0)

## 2020-02-26 LAB — COMPREHENSIVE METABOLIC PANEL
ALT: 22 U/L (ref 0–44)
AST: 32 U/L (ref 15–41)
Albumin: 3.2 g/dL — ABNORMAL LOW (ref 3.5–5.0)
Alkaline Phosphatase: 108 U/L (ref 38–126)
Anion gap: 8 (ref 5–15)
BUN: 11 mg/dL (ref 8–23)
CO2: 25 mmol/L (ref 22–32)
Calcium: 8 mg/dL — ABNORMAL LOW (ref 8.9–10.3)
Chloride: 105 mmol/L (ref 98–111)
Creatinine, Ser: 0.74 mg/dL (ref 0.61–1.24)
GFR calc Af Amer: >60 mL/min (ref >60)
GFR calc non Af Amer: >60 mL/min (ref >60)
Glucose, Bld: 199 mg/dL — ABNORMAL HIGH (ref 70–99)
Potassium: 3.5 mmol/L (ref 3.5–5.1)
Sodium: 138 mmol/L (ref 135–145)
Total Bilirubin: 0.7 mg/dL (ref 0.3–1.2)
Total Protein: 6.6 g/dL (ref 6.5–8.1)

## 2020-02-26 MED ORDER — SODIUM CHLORIDE 0.9 % IV SOLN
5000.0000 mg | INTRAVENOUS | Status: DC
  Administered 2020-02-26: 5000 mg via INTRAVENOUS
  Filled 2020-02-26: qty 100

## 2020-02-26 MED ORDER — DEXTROSE 5 % IV SOLN
Freq: Once | INTRAVENOUS | Status: AC
  Filled 2020-02-26: qty 250

## 2020-02-26 MED ORDER — PALONOSETRON HCL INJECTION 0.25 MG/5ML
0.2500 mg | Freq: Once | INTRAVENOUS | Status: AC
  Administered 2020-02-26: 0.25 mg via INTRAVENOUS
  Filled 2020-02-26: qty 5

## 2020-02-26 MED ORDER — SODIUM CHLORIDE 0.9% FLUSH
10.0000 mL | INTRAVENOUS | Status: DC | PRN
  Administered 2020-02-26: 10 mL
  Filled 2020-02-26: qty 10

## 2020-02-26 MED ORDER — OXALIPLATIN CHEMO INJECTION 100 MG/20ML
85.0000 mg/m2 | Freq: Once | INTRAVENOUS | Status: AC
  Administered 2020-02-26: 180 mg via INTRAVENOUS
  Filled 2020-02-26: qty 36

## 2020-02-26 MED ORDER — SODIUM CHLORIDE 0.9 % IV SOLN
5.0000 mg/kg | Freq: Once | INTRAVENOUS | Status: AC
  Administered 2020-02-26: 400 mg via INTRAVENOUS
  Filled 2020-02-26: qty 16

## 2020-02-26 MED ORDER — SODIUM CHLORIDE 0.9 % IV SOLN
Freq: Once | INTRAVENOUS | Status: AC
  Filled 2020-02-26: qty 250

## 2020-02-26 MED ORDER — LEUCOVORIN CALCIUM INJECTION 350 MG
800.0000 mg | Freq: Once | INTRAVENOUS | Status: AC
  Administered 2020-02-26: 800 mg via INTRAVENOUS
  Filled 2020-02-26: qty 40

## 2020-02-26 MED ORDER — SODIUM CHLORIDE 0.9% FLUSH
10.0000 mL | INTRAVENOUS | Status: DC | PRN
  Administered 2020-02-26: 10 mL via INTRAVENOUS
  Filled 2020-02-26: qty 10

## 2020-02-26 MED ORDER — DEXAMETHASONE SODIUM PHOSPHATE 10 MG/ML IJ SOLN
10.0000 mg | Freq: Once | INTRAMUSCULAR | Status: AC
  Administered 2020-02-26: 10 mg via INTRAVENOUS
  Filled 2020-02-26: qty 1

## 2020-02-26 NOTE — Assessment & Plan Note (Addendum)
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  Currently on FOLFOX plus Avastin; CT [facility]ARCH 27th CT- overall stable/liver lesions; ~36mm nodule/slight progression of suture lesoins 58mm LL nodule. CEA-slightly rising around 24; will gte PET scan fo further evaluation. Marland Kitchen  #Proceed with today- cycle 14 of FOLFOX plus Avastin chemotherapy.  Labs today reviewed;  acceptable for treatment today-except for mild thrombocytopenia platelets 112.  PET ordered today-given the rising CEA.  Discussed the patient the rationale he understands..   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 Stable   # HTN- systolic 0000000 stable. Recommend monitoring at home; birng a log. monitor closely.  # Seasonal allergies-on singulair; improved.   # DISPOSITION: #FOLFOX+ Avastin today # follow up in 2 weeks- MD; labs- cbc/cmp/UA/ CEA; FOLFOX + avastin;pump off in 2 days;PET prior-Dr.B

## 2020-02-26 NOTE — Progress Notes (Signed)
[place] NOTE  Patient Care Team: Sofie Hartigan, MD as PCP - General (Family Medicine) [name], MD as Consulting Physician (Hematology and Oncology)  CHIEF COMPLAINTS/PURPOSE OF CONSULTATION: Colon cancer  #  Oncology History Overview Note  # MAY 2020- 3. 03/09/19 Liver biopsy. Microscopic examination shows malignant cells with glandular architecture consistent with adenocarcinoma. The malignant cells are positive for CK20 and CDX-2. These findings support the clinical impression of metastatic colon adenocarcinoma. 4. 03/10/19 R hemicolectomy. Tumor site cecum. Adenocarcinoma. Mucinous features present. G2. No tumor deposits. Invades visceral peritoneum. No tumor perforation. LVI present. PNI not identified. All margins uninvolved. 1/12 LNs. PT4apN1. Periappendiceal inflammation c/w resolving abscess. Microsatellite stable (MSS). [Dr.Mettu; [facility]]  # SEP 4th 2020 [compared to May 2020]  Interval increase in size of the metastases to the hepatic dome, The metastasis to the left hepatic lobe is unchanged; 2.  New subcentimeter hypoattenuating lesion in the inferior right hepatic lobe, incompletely characterized on CT. 3.  Postsurgical changes following right hemicolectomy.  # OCT 2020- FOLFOX +avastin; CT dec 22nd 2020- [compared to [facility] sep 9th 2020]-Liver- slight progression versus stable disease.  # NGS/F-ONE-MUTATED K-RAS [G]  # PALLIATIVE CARE EVALUATION: 09/20/2019-[name]  # PAIN MANAGEMENT: NA   DIAGNOSIS: COLON CANCER  STAGE:  IV     ;  GOALS:Palliative  CURRENT/MOST RECENT THERAPY : FOLFOX+ avastin [C]    Cancer of right colon ([place])  07/05/2019 Initial Diagnosis   Cancer of right colon ([place])   07/24/2019 -  Chemotherapy   The patient had palonosetron (ALOXI) injection 0.25 mg, 0.25 mg, Intravenous,  Once, 14 of 17 cycles Administration: 0.25 mg (07/24/2019), 0.25 mg (08/07/2019), 0.25 mg (08/21/2019), 0.25 mg (09/04/2019), 0.25 mg (09/20/2019), 0.25 mg  (10/04/2019), 0.25 mg (10/16/2019), 0.25 mg (10/30/2019), 0.25 mg (11/22/2019), 0.25 mg (12/06/2019), 0.25 mg (12/20/2019), 0.25 mg (01/03/2020), 0.25 mg (01/29/2020), 0.25 mg (02/12/2020) leucovorin 800 mg in dextrose 5 % 250 mL infusion, 844 mg, Intravenous,  Once, 14 of 17 cycles Administration: 800 mg (07/24/2019), 800 mg (08/07/2019), 800 mg (08/21/2019), 800 mg (09/04/2019), 800 mg (09/20/2019), 800 mg (10/04/2019), 800 mg (10/16/2019), 800 mg (10/30/2019), 800 mg (11/22/2019), 800 mg (12/06/2019), 800 mg (12/20/2019), 800 mg (01/03/2020), 800 mg (01/29/2020), 800 mg (02/12/2020) oxaliplatin (ELOXATIN) 180 mg in dextrose 5 % 500 mL chemo infusion, 85 mg/m2 = 180 mg, Intravenous,  Once, 14 of 17 cycles Administration: 180 mg (07/24/2019), 180 mg (08/07/2019), 180 mg (08/21/2019), 180 mg (09/04/2019), 180 mg (09/20/2019), 180 mg (10/04/2019), 180 mg (10/16/2019), 180 mg (10/30/2019), 180 mg (11/22/2019), 180 mg (12/06/2019), 180 mg (12/20/2019), 180 mg (01/03/2020), 180 mg (01/29/2020), 180 mg (02/12/2020) fluorouracil (ADRUCIL) 5,000 mg in sodium chloride 0.9 % 150 mL chemo infusion, 5,050 mg, Intravenous, 1 Day/Dose, 14 of 17 cycles Administration: 5,000 mg (07/24/2019), 5,000 mg (08/07/2019), 5,000 mg (08/21/2019), 5,050 mg (09/04/2019), 5,000 mg (10/04/2019), 5,000 mg (10/16/2019), 5,000 mg (11/22/2019), 5,000 mg (12/06/2019), 5,000 mg (12/20/2019), 5,000 mg (01/03/2020), 5,000 mg (01/29/2020), 5,000 mg (02/12/2020) bevacizumab-bvzr (ZIRABEV) 400 mg in sodium chloride 0.9 % 100 mL chemo infusion, 5 mg/kg = 400 mg, Intravenous,  Once, 14 of 17 cycles Administration: 400 mg (08/07/2019), 400 mg (08/21/2019), 400 mg (09/04/2019), 400 mg (09/20/2019), 400 mg (10/04/2019), 400 mg (10/16/2019), 400 mg (10/30/2019), 400 mg (11/22/2019), 400 mg (12/06/2019), 400 mg (12/20/2019), 400 mg (01/03/2020), 400 mg (01/29/2020), 400 mg (02/12/2020)  for chemotherapy treatment.      HISTORY OF PRESENTING ILLNESS:  Oscar [name] 79 y.o.  male with  metastatic  colon cancer to the liver currently on FOLFOX plus Avastin is here for follow-up.  Patient complains of mild tingling and numbness in the extremities.  Not any worse.  Appetite is good.  No weight loss.  No nausea no vomiting.  Patient has sniffles/runny nose improved on singulair.   Review of Systems  Constitutional: Positive for malaise/fatigue. Negative for chills, diaphoresis, fever and weight loss.  HENT: Negative for nosebleeds and sore throat.   Eyes: Negative for double vision.  Respiratory: Negative for cough, hemoptysis, sputum production, shortness of breath and wheezing.   Cardiovascular: Negative for chest pain, palpitations, orthopnea and leg swelling.  Gastrointestinal: Negative for abdominal pain, blood in stool, constipation, diarrhea, heartburn, melena, nausea and vomiting.  Genitourinary: Negative for dysuria, frequency and urgency.  Musculoskeletal: Positive for back pain and joint pain.  Skin: Negative.  Negative for itching and rash.  Neurological: Positive for tingling. Negative for dizziness, focal weakness, weakness and headaches.  Endo/Heme/Allergies: Does not bruise/bleed easily.  Psychiatric/Behavioral: Negative for depression. The patient is not nervous/anxious and does not have insomnia.      MEDICAL HISTORY:  Past Medical History:  Diagnosis Date  . Arthritis   . Cancer ([place])    colon cancer 02/2019 per pt   . Diabetes mellitus without complication ([place])   . H/O colon cancer, stage IV   . Hyperlipemia   . Hypertension     SURGICAL HISTORY: Past Surgical History:  Procedure Laterality Date  . IR IMAGING GUIDED PORT INSERTION  07/20/2019  . JOINT REPLACEMENT      SOCIAL HISTORY: Social History   Socioeconomic History  . Marital status: Married    Spouse name: Not on file  . Number of children: Not on file  . Years of education: Not on file  . Highest education level: Not on file  Occupational History  . Not on file  Tobacco Use  . Smoking  status: Current Every Day Smoker    Packs/day: 0.25    Types: Cigarettes  . Smokeless tobacco: Never Used  . Tobacco comment: 1 to 2 cigarettes a day occasionally  Substance and Sexual Activity  . Alcohol use: No  . Drug use: No  . Sexual activity: Not on file  Other Topics Concern  . Not on file  Social History Narrative   Recruitment consultant retd; lives in [place]; smoking 3cig/day; [3/4 ppd x started at 9 years]; no alcohol. Son & daughter; wife dementia [waiting for placement].    Social Determinants of Health   Financial Resource Strain:   . Difficulty of Paying Living Expenses:   Food Insecurity:   . Worried About Charity fundraiser in the Last Year:   . Arboriculturist in the Last Year:   Transportation Needs:   . Film/video editor (Medical):   Marland Kitchen Lack of Transportation (Non-Medical):   Physical Activity:   . Days of Exercise per Week:   . Minutes of Exercise per Session:   Stress:   . Feeling of Stress :   Social Connections:   . Frequency of Communication with Friends and Family:   . Frequency of Social Gatherings with Friends and Family:   . Attends Religious Services:   . Active Member of Clubs or Organizations:   . Attends Archivist Meetings:   Marland Kitchen Marital Status:   Intimate Partner Violence:   . Fear of Current or Ex-Partner:   . Emotionally Abused:   Marland Kitchen Physically Abused:   .  Sexually Abused:     FAMILY HISTORY: Family History  Problem Relation Age of Onset  . Peptic Ulcer Disease Father     ALLERGIES:  is allergic to ace inhibitors.  MEDICATIONS:  Current Outpatient Medications  Medication Sig Dispense Refill  . amLODipine (NORVASC) 5 MG tablet Take 5 mg by mouth daily.    . fluticasone (FLONASE) 50 MCG/ACT nasal spray Place 1 spray into both nostrils daily as needed.     . meloxicam (MOBIC) 15 MG tablet Take 1 tablet (15 mg total) by mouth daily. 90 tablet 0  . montelukast (SINGULAIR) 10 MG tablet Take 1 tablet (10 mg  total) by mouth at bedtime. 30 tablet 1  . pravastatin (PRAVACHOL) 40 MG tablet Take 40 mg by mouth daily.    . tamsulosin (FLOMAX) 0.4 MG CAPS capsule Take 1 capsule by mouth daily.    Marland Kitchen lidocaine-prilocaine (EMLA) cream Apply 1 application topically as needed. (Patient not taking: Reported on 12/05/2019) 30 g 0  . ondansetron (ZOFRAN-ODT) 4 MG disintegrating tablet Take 1 tablet (4 mg total) by mouth every 6 (six) hours as needed for nausea. (Patient not taking: Reported on 12/05/2019) 20 tablet 0  . prochlorperazine (COMPAZINE) 10 MG tablet Take 1 tablet (10 mg total) by mouth every 6 (six) hours as needed for nausea or vomiting. (Patient not taking: Reported on 12/05/2019) 40 tablet 1   No current facility-administered medications for this visit.   Facility-Administered Medications Ordered in Other Visits  Medication Dose Route Frequency Provider Last Rate Last Admin  . sodium chloride flush (NS) 0.9 % injection 10 mL  10 mL Intravenous PRN Sindy Guadeloupe, MD   10 mL at 02/26/20 0910      .  PHYSICAL EXAMINATION: ECOG PERFORMANCE STATUS: 0 - Asymptomatic  Vitals:   02/26/20 0926  BP: (!) 155/76  Pulse: 71   Filed Weights   02/26/20 0926  Weight: 191 lb (86.6 kg)    Physical Exam  Constitutional: He is oriented to person, place, and time and well-developed, well-nourished, and in no distress.  HENT:  Head: Normocephalic and atraumatic.  Mouth/Throat: Oropharynx is clear and moist. No oropharyngeal exudate.  Eyes: Pupils are equal, round, and reactive to light.  Cardiovascular: Normal rate and regular rhythm.  Pulmonary/Chest: No respiratory distress. He has no wheezes.  Abdominal: Soft. Bowel sounds are normal. He exhibits no distension and no mass. There is no abdominal tenderness. There is no rebound and no guarding.  Musculoskeletal:        General: No tenderness or edema. Normal range of motion.     Cervical back: Normal range of motion and neck supple.  Neurological: He  is alert and oriented to person, place, and time.  Skin: Skin is warm.  Psychiatric: Affect normal.   LABORATORY DATA:  I have reviewed the data as listed Lab Results  Component Value Date   WBC 5.1 02/26/2020   HGB 13.0 02/26/2020   HCT 37.2 (L) 02/26/2020   MCV 97.6 02/26/2020   PLT 112 (L) 02/26/2020   Recent Labs    01/29/20 0912 02/12/20 0841 02/26/20 0904  NA 140 139 138  K 3.8 3.7 3.5  CL 109 106 105  CO2 '25 25 25  '$ GLUCOSE 168* 155* 199*  BUN '10 8 11  '$ CREATININE 0.72 0.71 0.74  CALCIUM 8.3* 8.3* 8.0*  GFRNONAA >60 >60 >60  GFRAA >60 >60 >60  PROT 6.4* 6.9 6.6  ALBUMIN 3.2* 3.4* 3.2*  AST 29 28 32  ALT '17 18 22  '$ ALKPHOS 100 117 108  BILITOT 0.8 0.8 0.7    RADIOGRAPHIC STUDIES: I have personally reviewed the radiological images as listed and agreed with the findings in the report. No results found.  ASSESSMENT & PLAN:   Cancer of right colon ([place]) #Right-sided colon adenocarcinoma-with synchronous metastasis to liver/unresectable.  Currently on FOLFOX plus Avastin; CT MARCH 27th CT- overall stable/liver lesions; ~45m nodule/slight progression of suture lesoins 417mLL nodule. CEA-slightly rising around 24; will gte PET scan fo further evaluation. . Marland Kitchen#Proceed with today- cycle 14 of FOLFOX plus Avastin chemotherapy.  Labs today reviewed;  acceptable for treatment today-except for mild thrombocytopenia platelets 112.  PET ordered today-given the rising CEA.  Discussed the patient the rationale he understands..   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 Stable   # HTN- systolic 14658Mtable. Recommend monitoring at home; birng a log. monitor closely.  # Seasonal allergies-on singulair; improved.   # DISPOSITION: #FOLFOX+ Avastin today # follow up in 2 weeks- MD; labs- cbc/cmp/UA/ CEA; FOLFOX + avastin;pump off in 2 days;PET prior-Dr.B  All questions were answered. The patient knows to call the clinic with any problems, questions or concerns.    GoCammie SickleMD 02/26/2020 9:44 AM

## 2020-02-27 LAB — CEA: CEA: 23.2 ng/mL — ABNORMAL HIGH (ref 0.0–4.7)

## 2020-02-28 ENCOUNTER — Other Ambulatory Visit: Payer: Self-pay

## 2020-02-28 ENCOUNTER — Inpatient Hospital Stay: Payer: Medicare HMO

## 2020-02-28 VITALS — BP 160/88 | HR 65 | Temp 96.6°F | Resp 20

## 2020-02-28 DIAGNOSIS — C182 Malignant neoplasm of ascending colon: Secondary | ICD-10-CM | POA: Diagnosis not present

## 2020-02-28 DIAGNOSIS — Z5111 Encounter for antineoplastic chemotherapy: Secondary | ICD-10-CM | POA: Diagnosis not present

## 2020-02-28 DIAGNOSIS — C787 Secondary malignant neoplasm of liver and intrahepatic bile duct: Secondary | ICD-10-CM | POA: Diagnosis not present

## 2020-02-28 DIAGNOSIS — Z7189 Other specified counseling: Secondary | ICD-10-CM

## 2020-02-28 DIAGNOSIS — Z5112 Encounter for antineoplastic immunotherapy: Secondary | ICD-10-CM | POA: Diagnosis not present

## 2020-02-28 MED ORDER — HEPARIN SOD (PORK) LOCK FLUSH 100 UNIT/ML IV SOLN
INTRAVENOUS | Status: AC
  Filled 2020-02-28: qty 5

## 2020-02-28 MED ORDER — SODIUM CHLORIDE 0.9% FLUSH
10.0000 mL | INTRAVENOUS | Status: DC | PRN
  Administered 2020-02-28: 10 mL
  Filled 2020-02-28: qty 10

## 2020-02-28 MED ORDER — HEPARIN SOD (PORK) LOCK FLUSH 100 UNIT/ML IV SOLN
500.0000 [IU] | Freq: Once | INTRAVENOUS | Status: AC | PRN
  Administered 2020-02-28: 500 [IU]
  Filled 2020-02-28: qty 5

## 2020-03-04 NOTE — Progress Notes (Signed)
Pharmacist Chemotherapy Monitoring - Follow Up Assessment    I verify that I have reviewed each item in the below checklist:  . Regimen for the patient is scheduled for the appropriate day and plan matches scheduled date. Marland Kitchen Appropriate non-routine labs are ordered dependent on drug ordered. . If applicable, additional medications reviewed and ordered per protocol based on lifetime cumulative doses and/or treatment regimen.   Plan for follow-up and/or issues identified: No . I-vent associated with next due treatment: No . MD and/or nursing notified: No  Oscar [name] K 03/04/2020 8:19 AM

## 2020-03-05 ENCOUNTER — Other Ambulatory Visit: Payer: Self-pay | Admitting: Internal Medicine

## 2020-03-05 ENCOUNTER — Inpatient Hospital Stay (HOSPITAL_BASED_OUTPATIENT_CLINIC_OR_DEPARTMENT_OTHER): Payer: Medicare HMO | Admitting: Hospice and Palliative Medicine

## 2020-03-05 DIAGNOSIS — C182 Malignant neoplasm of ascending colon: Secondary | ICD-10-CM | POA: Diagnosis not present

## 2020-03-05 DIAGNOSIS — Z515 Encounter for palliative care: Secondary | ICD-10-CM

## 2020-03-05 NOTE — Progress Notes (Signed)
Virtual Visit via Telephone Note  I connected with Oscar [name] on 03/05/20 at  1:00 PM EDT by telephone and verified that I am speaking with the correct person using two identifiers.   I discussed the limitations, risks, security and privacy concerns of performing an evaluation and management service by telephone and the availability of in person appointments. I also discussed with the patient that there may be a patient responsible charge related to this service. The patient expressed understanding and agreed to proceed.   History of Present Illness: [name] is a 79 year old man with multiple medical problems including stage IV colorectal cancer currently on systemic treatment with FOLFOX.   Observations/Objective: Virtual visit attempted but patient did not respond to text.  I called and spoke with him by phone.  He reports he is doing well.  He denies any significant changes or concerns.  No symptomatic complaints today.  No issues with medications or treatments.  No need for refills.  Patient reports good oral intake and stable performance status.  He continues to golf regularly.  Patient is pending PET scan later this week.  Assessment and Plan: Stage IV colorectal cancer -on treatment with FOLFOX.  Patient is followed by Dr. Rogue Bussing.  Pending PET scan later this week.  Will follow  Follow Up Instructions: Follow-up virtual visit with me in 4 weeks   I discussed the assessment and treatment plan with the patient. The patient was provided an opportunity to ask questions and all were answered. The patient agreed with the plan and demonstrated an understanding of the instructions.   The patient was advised to call back or seek an in-person evaluation if the symptoms worsen or if the condition fails to improve as anticipated.  I provided 5 minutes of non-face-to-face time during this encounter.   [name], NP

## 2020-03-11 ENCOUNTER — Inpatient Hospital Stay: Payer: Medicare HMO

## 2020-03-11 ENCOUNTER — Inpatient Hospital Stay: Payer: Medicare HMO | Admitting: Internal Medicine

## 2020-03-11 ENCOUNTER — Other Ambulatory Visit: Payer: Self-pay

## 2020-03-11 ENCOUNTER — Encounter: Payer: Self-pay | Admitting: Internal Medicine

## 2020-03-11 DIAGNOSIS — Z5111 Encounter for antineoplastic chemotherapy: Secondary | ICD-10-CM | POA: Diagnosis not present

## 2020-03-11 DIAGNOSIS — C182 Malignant neoplasm of ascending colon: Secondary | ICD-10-CM

## 2020-03-11 DIAGNOSIS — Z95828 Presence of other vascular implants and grafts: Secondary | ICD-10-CM

## 2020-03-11 DIAGNOSIS — Z5112 Encounter for antineoplastic immunotherapy: Secondary | ICD-10-CM | POA: Diagnosis not present

## 2020-03-11 DIAGNOSIS — C787 Secondary malignant neoplasm of liver and intrahepatic bile duct: Secondary | ICD-10-CM | POA: Diagnosis not present

## 2020-03-11 DIAGNOSIS — Z7189 Other specified counseling: Secondary | ICD-10-CM

## 2020-03-11 LAB — CBC WITH DIFFERENTIAL/PLATELET
Abs Immature Granulocytes: 0 10*3/uL (ref 0.00–0.07)
Basophils Absolute: 0.1 10*3/uL (ref 0.0–0.1)
Basophils Relative: 1 %
Eosinophils Absolute: 0.4 10*3/uL (ref 0.0–0.5)
Eosinophils Relative: 10 %
HCT: 38.2 % — ABNORMAL LOW (ref 39.0–52.0)
Hemoglobin: 13.3 g/dL (ref 13.0–17.0)
Immature Granulocytes: 0 %
Lymphocytes Relative: 30 %
Lymphs Abs: 1.3 10*3/uL (ref 0.7–4.0)
MCH: 34.2 pg — ABNORMAL HIGH (ref 26.0–34.0)
MCHC: 34.8 g/dL (ref 30.0–36.0)
MCV: 98.2 fL (ref 80.0–100.0)
Monocytes Absolute: 0.5 10*3/uL (ref 0.1–1.0)
Monocytes Relative: 12 %
Neutro Abs: 2 10*3/uL (ref 1.7–7.7)
Neutrophils Relative %: 47 %
Platelets: 94 10*3/uL — ABNORMAL LOW (ref 150–400)
RBC: 3.89 MIL/uL — ABNORMAL LOW (ref 4.22–5.81)
RDW: 14 % (ref 11.5–15.5)
WBC: 4.3 10*3/uL (ref 4.0–10.5)
nRBC: 0 % (ref 0.0–0.2)

## 2020-03-11 LAB — COMPREHENSIVE METABOLIC PANEL
ALT: 21 U/L (ref 0–44)
AST: 32 U/L (ref 15–41)
Albumin: 3.3 g/dL — ABNORMAL LOW (ref 3.5–5.0)
Alkaline Phosphatase: 116 U/L (ref 38–126)
Anion gap: 6 (ref 5–15)
BUN: 10 mg/dL (ref 8–23)
CO2: 25 mmol/L (ref 22–32)
Calcium: 8.2 mg/dL — ABNORMAL LOW (ref 8.9–10.3)
Chloride: 106 mmol/L (ref 98–111)
Creatinine, Ser: 0.86 mg/dL (ref 0.61–1.24)
GFR calc Af Amer: >60 mL/min (ref >60)
GFR calc non Af Amer: >60 mL/min (ref >60)
Glucose, Bld: 189 mg/dL — ABNORMAL HIGH (ref 70–99)
Potassium: 3.6 mmol/L (ref 3.5–5.1)
Sodium: 137 mmol/L (ref 135–145)
Total Bilirubin: 0.6 mg/dL (ref 0.3–1.2)
Total Protein: 6.7 g/dL (ref 6.5–8.1)

## 2020-03-11 LAB — URINALYSIS, COMPLETE (UACMP) WITH MICROSCOPIC
Bilirubin Urine: NEGATIVE
Glucose, UA: 50 mg/dL — AB
Hgb urine dipstick: NEGATIVE
Ketones, ur: 5 mg/dL — AB
Leukocytes,Ua: NEGATIVE
Nitrite: NEGATIVE
Protein, ur: 30 mg/dL — AB
Specific Gravity, Urine: 1.03 (ref 1.005–1.030)
pH: 5 (ref 5.0–8.0)

## 2020-03-11 MED ORDER — DEXAMETHASONE SODIUM PHOSPHATE 10 MG/ML IJ SOLN
10.0000 mg | Freq: Once | INTRAMUSCULAR | Status: AC
  Administered 2020-03-11: 10 mg via INTRAVENOUS
  Filled 2020-03-11: qty 1

## 2020-03-11 MED ORDER — OXALIPLATIN CHEMO INJECTION 100 MG/20ML
85.0000 mg/m2 | Freq: Once | INTRAVENOUS | Status: AC
  Administered 2020-03-11: 180 mg via INTRAVENOUS
  Filled 2020-03-11: qty 36

## 2020-03-11 MED ORDER — PALONOSETRON HCL INJECTION 0.25 MG/5ML
0.2500 mg | Freq: Once | INTRAVENOUS | Status: AC
  Administered 2020-03-11: 0.25 mg via INTRAVENOUS
  Filled 2020-03-11: qty 5

## 2020-03-11 MED ORDER — LEUCOVORIN CALCIUM INJECTION 350 MG
379.0000 mg/m2 | Freq: Once | INTRAVENOUS | Status: AC
  Administered 2020-03-11: 800 mg via INTRAVENOUS
  Filled 2020-03-11: qty 25

## 2020-03-11 MED ORDER — DEXTROSE 5 % IV SOLN
Freq: Once | INTRAVENOUS | Status: AC
  Filled 2020-03-11: qty 250

## 2020-03-11 MED ORDER — SODIUM CHLORIDE 0.9 % IV SOLN
2369.0000 mg/m2 | INTRAVENOUS | Status: DC
  Administered 2020-03-11: 5000 mg via INTRAVENOUS
  Filled 2020-03-11: qty 100

## 2020-03-11 MED ORDER — SODIUM CHLORIDE 0.9% FLUSH
10.0000 mL | INTRAVENOUS | Status: DC | PRN
  Administered 2020-03-11: 10 mL via INTRAVENOUS
  Filled 2020-03-11: qty 10

## 2020-03-11 MED ORDER — SODIUM CHLORIDE 0.9 % IV SOLN
5.0000 mg/kg | Freq: Once | INTRAVENOUS | Status: AC
  Administered 2020-03-11: 400 mg via INTRAVENOUS
  Filled 2020-03-11: qty 16

## 2020-03-11 NOTE — Assessment & Plan Note (Addendum)
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  Currently on FOLFOX plus Avastin; CT MARCH 27th CT- overall stable/liver lesions; ~14mm nodule/slight progression of suture lesoins 41mm LL nodule. CEA-slightly rising around 24; PET scan-we will reschedule next week.  #Proceed with today- cycle 14 of FOLFOX plus Avastin chemotherapy.  Labs today reviewed;  acceptable for treatment today-except for mild thrombocytopenia platelets 94.   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  Stable  # HTN- systolic 0000000 stable. Recommend monitoring at home; stable  # Seasonal allergies-on singulair; improved.   # DISPOSITION: #FOLFOX+ Avastin today # follow up in 2 weeks- MD; labs- cbc/cmp/UA/ CEA; FOLFOX + avastin;pump off in 2 days; -Dr.B

## 2020-03-11 NOTE — Progress Notes (Signed)
[place] NOTE  Patient Care Team: Sofie Hartigan, MD as PCP - General (Family Medicine) [name], MD as Consulting Physician (Hematology and Oncology)  CHIEF COMPLAINTS/PURPOSE OF CONSULTATION: Colon cancer  #  Oncology History Overview Note  # MAY 2020- 3. 03/09/19 Liver biopsy. Microscopic examination shows malignant cells with glandular architecture consistent with adenocarcinoma. The malignant cells are positive for CK20 and CDX-2. These findings support the clinical impression of metastatic colon adenocarcinoma. 4. 03/10/19 R hemicolectomy. Tumor site cecum. Adenocarcinoma. Mucinous features present. G2. No tumor deposits. Invades visceral peritoneum. No tumor perforation. LVI present. PNI not identified. All margins uninvolved. 1/12 LNs. PT4apN1. Periappendiceal inflammation c/w resolving abscess. Microsatellite stable (MSS). [Dr.Mettu; [facility]]  # SEP 4th 2020 [compared to May 2020]  Interval increase in size of the metastases to the hepatic dome, The metastasis to the left hepatic lobe is unchanged; 2.  New subcentimeter hypoattenuating lesion in the inferior right hepatic lobe, incompletely characterized on CT. 3.  Postsurgical changes following right hemicolectomy.  # OCT 2020- FOLFOX +avastin; CT dec 22nd 2020- [compared to [facility] sep 9th 2020]-Liver- slight progression versus stable disease.  # NGS/F-ONE-MUTATED K-RAS [G]  # PALLIATIVE CARE EVALUATION: 09/20/2019-[name]  # PAIN MANAGEMENT: NA   DIAGNOSIS: COLON CANCER  STAGE:  IV     ;  GOALS:Palliative  CURRENT/MOST RECENT THERAPY : FOLFOX+ avastin [C]    Cancer of right colon ([place])  07/05/2019 Initial Diagnosis   Cancer of right colon ([place])   07/24/2019 -  Chemotherapy   The patient had palonosetron (ALOXI) injection 0.25 mg, 0.25 mg, Intravenous,  Once, 16 of 17 cycles Administration: 0.25 mg (07/24/2019), 0.25 mg (08/07/2019), 0.25 mg (08/21/2019), 0.25 mg (09/04/2019), 0.25 mg (09/20/2019), 0.25 mg  (10/04/2019), 0.25 mg (10/16/2019), 0.25 mg (10/30/2019), 0.25 mg (11/22/2019), 0.25 mg (12/06/2019), 0.25 mg (12/20/2019), 0.25 mg (01/03/2020), 0.25 mg (01/29/2020), 0.25 mg (02/12/2020), 0.25 mg (02/26/2020) leucovorin 800 mg in dextrose 5 % 250 mL infusion, 844 mg, Intravenous,  Once, 16 of 17 cycles Administration: 800 mg (07/24/2019), 800 mg (08/07/2019), 800 mg (08/21/2019), 800 mg (09/04/2019), 800 mg (09/20/2019), 800 mg (10/04/2019), 800 mg (10/16/2019), 800 mg (10/30/2019), 800 mg (11/22/2019), 800 mg (12/06/2019), 800 mg (12/20/2019), 800 mg (01/03/2020), 800 mg (01/29/2020), 800 mg (02/12/2020), 800 mg (02/26/2020) oxaliplatin (ELOXATIN) 180 mg in dextrose 5 % 500 mL chemo infusion, 85 mg/m2 = 180 mg, Intravenous,  Once, 16 of 17 cycles Administration: 180 mg (07/24/2019), 180 mg (08/07/2019), 180 mg (08/21/2019), 180 mg (09/04/2019), 180 mg (09/20/2019), 180 mg (10/04/2019), 180 mg (10/16/2019), 180 mg (10/30/2019), 180 mg (11/22/2019), 180 mg (12/06/2019), 180 mg (12/20/2019), 180 mg (01/03/2020), 180 mg (01/29/2020), 180 mg (02/12/2020), 180 mg (02/26/2020) fluorouracil (ADRUCIL) 5,000 mg in sodium chloride 0.9 % 150 mL chemo infusion, 5,050 mg, Intravenous, 1 Day/Dose, 16 of 17 cycles Administration: 5,000 mg (07/24/2019), 5,000 mg (08/07/2019), 5,000 mg (08/21/2019), 5,050 mg (09/04/2019), 5,000 mg (10/04/2019), 5,000 mg (10/16/2019), 5,000 mg (11/22/2019), 5,000 mg (12/06/2019), 5,000 mg (12/20/2019), 5,000 mg (01/03/2020), 5,000 mg (01/29/2020), 5,000 mg (02/12/2020), 5,000 mg (02/26/2020) bevacizumab-bvzr (ZIRABEV) 400 mg in sodium chloride 0.9 % 100 mL chemo infusion, 5 mg/kg = 400 mg, Intravenous,  Once, 16 of 17 cycles Administration: 400 mg (08/07/2019), 400 mg (08/21/2019), 400 mg (09/04/2019), 400 mg (09/20/2019), 400 mg (10/04/2019), 400 mg (10/16/2019), 400 mg (10/30/2019), 400 mg (11/22/2019), 400 mg (12/06/2019), 400 mg (12/20/2019), 400 mg (01/03/2020), 400 mg (01/29/2020), 400 mg (02/12/2020), 400 mg (02/26/2020)  for chemotherapy  treatment.  HISTORY OF PRESENTING ILLNESS:  LONZA SHIMABUKURO 79 y.o.  male with metastatic colon cancer to the liver currently on FOLFOX plus Avastin is here for follow-up.  Denies any nausea vomiting.  Appetite is good.  No weight loss.  Mild tingling numbness in extremities.   Review of Systems  Constitutional: Positive for malaise/fatigue. Negative for chills, diaphoresis, fever and weight loss.  HENT: Negative for nosebleeds and sore throat.   Eyes: Negative for double vision.  Respiratory: Negative for cough, hemoptysis, sputum production, shortness of breath and wheezing.   Cardiovascular: Negative for chest pain, palpitations, orthopnea and leg swelling.  Gastrointestinal: Negative for abdominal pain, blood in stool, constipation, diarrhea, heartburn, melena, nausea and vomiting.  Genitourinary: Negative for dysuria, frequency and urgency.  Musculoskeletal: Positive for back pain and joint pain.  Skin: Negative.  Negative for itching and rash.  Neurological: Positive for tingling. Negative for dizziness, focal weakness, weakness and headaches.  Endo/Heme/Allergies: Does not bruise/bleed easily.  Psychiatric/Behavioral: Negative for depression. The patient is not nervous/anxious and does not have insomnia.      MEDICAL HISTORY:  Past Medical History:  Diagnosis Date  . Arthritis   . Cancer ([place])    colon cancer 02/2019 per pt   . Diabetes mellitus without complication ([place])   . H/O colon cancer, stage IV   . Hyperlipemia   . Hypertension     SURGICAL HISTORY: Past Surgical History:  Procedure Laterality Date  . IR IMAGING GUIDED PORT INSERTION  07/20/2019  . JOINT REPLACEMENT      SOCIAL HISTORY: Social History   Socioeconomic History  . Marital status: Married    Spouse name: Not on file  . Number of children: Not on file  . Years of education: Not on file  . Highest education level: Not on file  Occupational History  . Not on file  Tobacco Use  .  Smoking status: Current Every Day Smoker    Packs/day: 0.25    Types: Cigarettes  . Smokeless tobacco: Never Used  . Tobacco comment: 1 to 2 cigarettes a day occasionally  Substance and Sexual Activity  . Alcohol use: No  . Drug use: No  . Sexual activity: Not on file  Other Topics Concern  . Not on file  Social History Narrative   Recruitment consultant retd; lives in [place]; smoking 3cig/day; [3/4 ppd x started at 9 years]; no alcohol. Son & daughter; wife dementia [waiting for placement].    Social Determinants of Health   Financial Resource Strain:   . Difficulty of Paying Living Expenses:   Food Insecurity:   . Worried About Charity fundraiser in the Last Year:   . Arboriculturist in the Last Year:   Transportation Needs:   . Film/video editor (Medical):   Marland Kitchen Lack of Transportation (Non-Medical):   Physical Activity:   . Days of Exercise per Week:   . Minutes of Exercise per Session:   Stress:   . Feeling of Stress :   Social Connections:   . Frequency of Communication with Friends and Family:   . Frequency of Social Gatherings with Friends and Family:   . Attends Religious Services:   . Active Member of Clubs or Organizations:   . Attends Archivist Meetings:   Marland Kitchen Marital Status:   Intimate Partner Violence:   . Fear of Current or Ex-Partner:   . Emotionally Abused:   Marland Kitchen Physically Abused:   . Sexually Abused:  FAMILY HISTORY: Family History  Problem Relation Age of Onset  . Peptic Ulcer Disease Father     ALLERGIES:  is allergic to ace inhibitors.  MEDICATIONS:  Current Outpatient Medications  Medication Sig Dispense Refill  . amLODipine (NORVASC) 5 MG tablet Take 5 mg by mouth daily.    . fluticasone (FLONASE) 50 MCG/ACT nasal spray Place 1 spray into both nostrils daily as needed.     . lidocaine-prilocaine (EMLA) cream Apply 1 application topically as needed. (Patient not taking: Reported on 12/05/2019) 30 g 0  . meloxicam  (MOBIC) 15 MG tablet Take 1 tablet (15 mg total) by mouth daily. 90 tablet 0  . montelukast (SINGULAIR) 10 MG tablet TAKE 1 TABLET BY MOUTH EVERYDAY AT BEDTIME 30 tablet 1  . ondansetron (ZOFRAN-ODT) 4 MG disintegrating tablet Take 1 tablet (4 mg total) by mouth every 6 (six) hours as needed for nausea. (Patient not taking: Reported on 12/05/2019) 20 tablet 0  . pravastatin (PRAVACHOL) 40 MG tablet Take 40 mg by mouth daily.    . prochlorperazine (COMPAZINE) 10 MG tablet Take 1 tablet (10 mg total) by mouth every 6 (six) hours as needed for nausea or vomiting. (Patient not taking: Reported on 12/05/2019) 40 tablet 1  . tamsulosin (FLOMAX) 0.4 MG CAPS capsule Take 1 capsule by mouth daily.     No current facility-administered medications for this visit.   Facility-Administered Medications Ordered in Other Visits  Medication Dose Route Frequency Provider Last Rate Last Admin  . fluorouracil (ADRUCIL) 5,000 mg in sodium chloride 0.9 % 150 mL chemo infusion  2,369 mg/m2 (Treatment Plan Recorded) Intravenous 1 day or 1 dose Charlaine Dalton R, MD      . leucovorin 800 mg in dextrose 5 % 250 mL infusion  379 mg/m2 (Treatment Plan Recorded) Intravenous Once Cammie Sickle, MD 145 mL/hr at 03/11/20 1115 800 mg at 03/11/20 1115  . oxaliplatin (ELOXATIN) 180 mg in dextrose 5 % 500 mL chemo infusion  85 mg/m2 (Treatment Plan Recorded) Intravenous Once Cammie Sickle, MD 268 mL/hr at 03/11/20 1117 180 mg at 03/11/20 1117  . sodium chloride flush (NS) 0.9 % injection 10 mL  10 mL Intravenous PRN Lequita Asal, MD   10 mL at 03/11/20 0815      .  PHYSICAL EXAMINATION: ECOG PERFORMANCE STATUS: 0 - Asymptomatic  Vitals:   03/11/20 0848  BP: (!) 152/80  Pulse: 61  Resp: 20  Temp: 97.7 F (36.5 C)   Filed Weights   03/11/20 0848  Weight: 191 lb 8 oz (86.9 kg)    Physical Exam  Constitutional: He is oriented to person, place, and time and well-developed, well-nourished, and in  no distress.  HENT:  Head: Normocephalic and atraumatic.  Mouth/Throat: Oropharynx is clear and moist. No oropharyngeal exudate.  Eyes: Pupils are equal, round, and reactive to light.  Cardiovascular: Normal rate and regular rhythm.  Pulmonary/Chest: No respiratory distress. He has no wheezes.  Abdominal: Soft. Bowel sounds are normal. He exhibits no distension and no mass. There is no abdominal tenderness. There is no rebound and no guarding.  Musculoskeletal:        General: No tenderness or edema. Normal range of motion.     Cervical back: Normal range of motion and neck supple.  Neurological: He is alert and oriented to person, place, and time.  Skin: Skin is warm.  Psychiatric: Affect normal.   LABORATORY DATA:  I have reviewed the data as listed Lab Results  Component Value Date   WBC 4.3 03/11/2020   HGB 13.3 03/11/2020   HCT 38.2 (L) 03/11/2020   MCV 98.2 03/11/2020   PLT 94 (L) 03/11/2020   Recent Labs    02/12/20 0841 02/26/20 0904 03/11/20 0809  NA 139 138 137  K 3.7 3.5 3.6  CL 106 105 106  CO2 '25 25 25  '$ GLUCOSE 155* 199* 189*  BUN '8 11 10  '$ CREATININE 0.71 0.74 0.86  CALCIUM 8.3* 8.0* 8.2*  GFRNONAA >60 >60 >60  GFRAA >60 >60 >60  PROT 6.9 6.6 6.7  ALBUMIN 3.4* 3.2* 3.3*  AST 28 32 32  ALT '18 22 21  '$ ALKPHOS 117 108 116  BILITOT 0.8 0.7 0.6    RADIOGRAPHIC STUDIES: I have personally reviewed the radiological images as listed and agreed with the findings in the report. No results found.  ASSESSMENT & PLAN:   Cancer of right colon ([place]) #Right-sided colon adenocarcinoma-with synchronous metastasis to liver/unresectable.  Currently on FOLFOX plus Avastin; CT MARCH 27th CT- overall stable/liver lesions; ~78m nodule/slight progression of suture lesoins 436mLL nodule. CEA-slightly rising around 24; PET scan-we will reschedule next week.  #Proceed with today- cycle 14 of FOLFOX plus Avastin chemotherapy.  Labs today reviewed;  acceptable for treatment  today-except for mild thrombocytopenia platelets 94.   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  Stable  # HTN- systolic [id number]. Recommend monitoring at home; stable  # Seasonal allergies-on singulair; improved.   # DISPOSITION: #FOLFOX+ Avastin today # follow up in 2 weeks- MD; labs- cbc/cmp/UA/ CEA; FOLFOX + avastin;pump off in 2 days; -Dr.B  All questions were answered. The patient knows to call the clinic with any problems, questions or concerns.    GoCammie SickleMD 03/11/2020 12:00 PM

## 2020-03-12 ENCOUNTER — Ambulatory Visit: Payer: Medicare HMO

## 2020-03-12 LAB — CEA: CEA: 23.4 ng/mL — ABNORMAL HIGH (ref 0.0–4.7)

## 2020-03-13 ENCOUNTER — Other Ambulatory Visit: Payer: Self-pay

## 2020-03-13 ENCOUNTER — Inpatient Hospital Stay: Payer: Medicare HMO

## 2020-03-13 DIAGNOSIS — C182 Malignant neoplasm of ascending colon: Secondary | ICD-10-CM | POA: Diagnosis not present

## 2020-03-13 DIAGNOSIS — Z5112 Encounter for antineoplastic immunotherapy: Secondary | ICD-10-CM | POA: Diagnosis not present

## 2020-03-13 DIAGNOSIS — Z5111 Encounter for antineoplastic chemotherapy: Secondary | ICD-10-CM | POA: Diagnosis not present

## 2020-03-13 DIAGNOSIS — Z7189 Other specified counseling: Secondary | ICD-10-CM

## 2020-03-13 DIAGNOSIS — C787 Secondary malignant neoplasm of liver and intrahepatic bile duct: Secondary | ICD-10-CM | POA: Diagnosis not present

## 2020-03-13 MED ORDER — SODIUM CHLORIDE 0.9% FLUSH
10.0000 mL | INTRAVENOUS | Status: DC | PRN
  Administered 2020-03-13: 10 mL
  Filled 2020-03-13: qty 10

## 2020-03-13 MED ORDER — HEPARIN SOD (PORK) LOCK FLUSH 100 UNIT/ML IV SOLN
500.0000 [IU] | Freq: Once | INTRAVENOUS | Status: AC | PRN
  Administered 2020-03-13: 500 [IU]
  Filled 2020-03-13: qty 5

## 2020-03-15 ENCOUNTER — Other Ambulatory Visit: Payer: Self-pay | Admitting: *Deleted

## 2020-03-15 MED ORDER — TAMSULOSIN HCL 0.4 MG PO CAPS: 0.4000 mg | ORAL_CAPSULE | Freq: Every day | ORAL | 11 refills | Status: DC

## 2020-03-20 ENCOUNTER — Other Ambulatory Visit: Payer: Self-pay

## 2020-03-20 ENCOUNTER — Ambulatory Visit
Admission: RE | Admit: 2020-03-20 | Discharge: 2020-03-20 | Disposition: A | Discharge status: Home / Self Care | Payer: Medicare HMO | Source: Ambulatory Visit | Attending: Internal Medicine | Admitting: Internal Medicine

## 2020-03-20 ENCOUNTER — Other Ambulatory Visit: Payer: Self-pay | Admitting: Internal Medicine

## 2020-03-20 DIAGNOSIS — C787 Secondary malignant neoplasm of liver and intrahepatic bile duct: Secondary | ICD-10-CM | POA: Diagnosis not present

## 2020-03-20 DIAGNOSIS — Z9049 Acquired absence of other specified parts of digestive tract: Secondary | ICD-10-CM | POA: Insufficient documentation

## 2020-03-20 DIAGNOSIS — C189 Malignant neoplasm of colon, unspecified: Secondary | ICD-10-CM | POA: Diagnosis not present

## 2020-03-20 DIAGNOSIS — C182 Malignant neoplasm of ascending colon: Secondary | ICD-10-CM

## 2020-03-20 LAB — GLUCOSE, CAPILLARY: Glucose-Capillary: 107 mg/dL — ABNORMAL HIGH (ref 70–99)

## 2020-03-20 IMAGING — CT NM PET TUM IMG RESTAG (PS) SKULL BASE T - THIGH
1 of 10 series · 1 of 25 positions shown · non-contrast
Comparison: Abdominopelvic CT [DATE]. Chest abdomen pelvic CTs
of [DATE]

CLINICAL DATA: Subsequent treatment strategy for colon cancer with
liver metastasis. Status post right hemicolectomy.

EXAM:
NUCLEAR MEDICINE PET SKULL BASE TO THIGH
TECHNIQUE: 10.7 mCi F-18 FDG was injected intravenously. Full-ring PET imaging
was performed from the skull base to thigh after the radiotracer. CT
data was obtained and used for attenuation correction and anatomic
localization.
Fasting blood glucose: 107 mg/dl

[Series 3: ct wb 5.0 b30f · axial · 5.0mm · 0.98mm/px · 1 of 329 slices shown]
[im 329/329  brain]
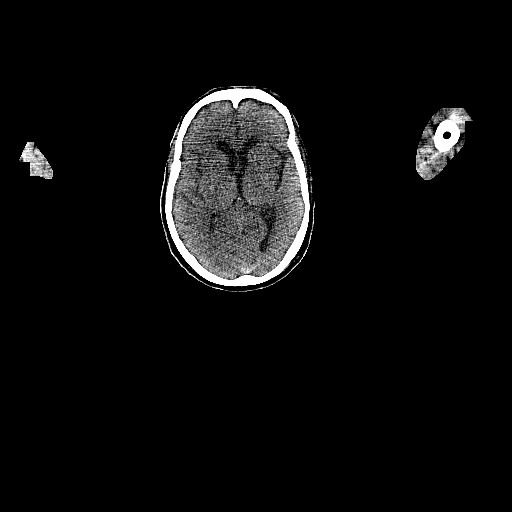

[1 of 25 positions shown; findings below may reference images not displayed]

FINDINGS: Mediastinal blood pool activity: SUV max

Liver activity: SUV max NA

NECK: No areas of abnormal hypermetabolism.

Incidental CT findings: No cervical adenopathy. Bilateral carotid
atherosclerosis.

CHEST: No pulmonary parenchymal or thoracic nodal hypermetabolism.

Incidental CT findings: Right Port-A-Cath tip superior caval/atrial
junction. Aortic and coronary artery atherosclerosis. Left lower
lobe pulmonary nodule of 4 mm on 134/3 is below PET resolution.

ABDOMEN/PELVIS: Hypermetabolism corresponding to a segment [DATE] liver
lesion. 3.5 x 2.9 cm and a S.U.V. max of 7.7 on 153/3. Similar in
size to 3.5 x 3.3 cm on the prior exam (when remeasured).

The soft tissue nodule adjacent the right hemicolectomy anastomosis
measures 1.7 cm and a S.U.V. max of 3.0 on 204/3. Compare 1.5 cm on
[DATE].

Incidental CT findings: Hepatic dome 6.5 cm cyst. Punctate left
renal collecting system calculi. Faint gallstone. Lower pole left
renal cyst. Abdominal aortic atherosclerosis.

SKELETON: No abnormal marrow activity.

Incidental CT findings: Bilateral hip osteoarthritis.
IMPRESSION: 1. Hypermetabolic left hepatic lobe metastasis.
2. Right hemicolectomy with hypermetabolic, enlarging metastatic
implant adjacent the anastomosis.
3. Incidental findings, including: Coronary artery atherosclerosis.
Aortic Atherosclerosis ([19]-[19]). Left nephrolithiasis.
Cholelithiasis.

## 2020-03-20 MED ORDER — FLUDEOXYGLUCOSE F - 18 (FDG) INJECTION
10.7000 | Freq: Once | INTRAVENOUS | Status: AC | PRN
  Administered 2020-03-20: 10.7 via INTRAVENOUS

## 2020-03-23 DIAGNOSIS — C182 Malignant neoplasm of ascending colon: Secondary | ICD-10-CM | POA: Diagnosis not present

## 2020-03-25 ENCOUNTER — Other Ambulatory Visit: Payer: Self-pay

## 2020-03-25 ENCOUNTER — Inpatient Hospital Stay: Payer: Medicare HMO | Admitting: Internal Medicine

## 2020-03-25 ENCOUNTER — Inpatient Hospital Stay: Payer: Medicare HMO

## 2020-03-25 ENCOUNTER — Inpatient Hospital Stay: Payer: Medicare HMO | Attending: Internal Medicine

## 2020-03-25 ENCOUNTER — Encounter: Payer: Self-pay | Admitting: Internal Medicine

## 2020-03-25 VITALS — BP 158/83 | HR 62 | Resp 18

## 2020-03-25 DIAGNOSIS — C182 Malignant neoplasm of ascending colon: Secondary | ICD-10-CM

## 2020-03-25 DIAGNOSIS — Z7189 Other specified counseling: Secondary | ICD-10-CM

## 2020-03-25 DIAGNOSIS — C787 Secondary malignant neoplasm of liver and intrahepatic bile duct: Secondary | ICD-10-CM | POA: Diagnosis not present

## 2020-03-25 DIAGNOSIS — Z5111 Encounter for antineoplastic chemotherapy: Secondary | ICD-10-CM | POA: Diagnosis not present

## 2020-03-25 DIAGNOSIS — Z5112 Encounter for antineoplastic immunotherapy: Secondary | ICD-10-CM | POA: Insufficient documentation

## 2020-03-25 LAB — CBC WITH DIFFERENTIAL/PLATELET
Abs Immature Granulocytes: 0.02 10*3/uL (ref 0.00–0.07)
Basophils Absolute: 0.1 10*3/uL (ref 0.0–0.1)
Basophils Relative: 1 %
Eosinophils Absolute: 0.4 10*3/uL (ref 0.0–0.5)
Eosinophils Relative: 9 %
HCT: 36.7 % — ABNORMAL LOW (ref 39.0–52.0)
Hemoglobin: 12.8 g/dL — ABNORMAL LOW (ref 13.0–17.0)
Immature Granulocytes: 0 %
Lymphocytes Relative: 29 %
Lymphs Abs: 1.3 10*3/uL (ref 0.7–4.0)
MCH: 34.2 pg — ABNORMAL HIGH (ref 26.0–34.0)
MCHC: 34.9 g/dL (ref 30.0–36.0)
MCV: 98.1 fL (ref 80.0–100.0)
Monocytes Absolute: 0.8 10*3/uL (ref 0.1–1.0)
Monocytes Relative: 17 %
Neutro Abs: 2 10*3/uL (ref 1.7–7.7)
Neutrophils Relative %: 44 %
Platelets: 94 10*3/uL — ABNORMAL LOW (ref 150–400)
RBC: 3.74 MIL/uL — ABNORMAL LOW (ref 4.22–5.81)
RDW: 14.4 % (ref 11.5–15.5)
WBC: 4.5 10*3/uL (ref 4.0–10.5)
nRBC: 0 % (ref 0.0–0.2)

## 2020-03-25 LAB — COMPREHENSIVE METABOLIC PANEL
ALT: 18 U/L (ref 0–44)
AST: 28 U/L (ref 15–41)
Albumin: 3.3 g/dL — ABNORMAL LOW (ref 3.5–5.0)
Alkaline Phosphatase: 110 U/L (ref 38–126)
Anion gap: 7 (ref 5–15)
BUN: 10 mg/dL (ref 8–23)
CO2: 26 mmol/L (ref 22–32)
Calcium: 8.4 mg/dL — ABNORMAL LOW (ref 8.9–10.3)
Chloride: 108 mmol/L (ref 98–111)
Creatinine, Ser: 0.75 mg/dL (ref 0.61–1.24)
GFR calc Af Amer: >60 mL/min (ref >60)
GFR calc non Af Amer: >60 mL/min (ref >60)
Glucose, Bld: 162 mg/dL — ABNORMAL HIGH (ref 70–99)
Potassium: 3.8 mmol/L (ref 3.5–5.1)
Sodium: 141 mmol/L (ref 135–145)
Total Bilirubin: 0.8 mg/dL (ref 0.3–1.2)
Total Protein: 6.7 g/dL (ref 6.5–8.1)

## 2020-03-25 LAB — URINALYSIS, COMPLETE (UACMP) WITH MICROSCOPIC
Bilirubin Urine: NEGATIVE
Glucose, UA: 50 mg/dL — AB
Hgb urine dipstick: NEGATIVE
Ketones, ur: 5 mg/dL — AB
Leukocytes,Ua: NEGATIVE
Nitrite: NEGATIVE
Protein, ur: 30 mg/dL — AB
Specific Gravity, Urine: 1.029 (ref 1.005–1.030)
pH: 5 (ref 5.0–8.0)

## 2020-03-25 MED ORDER — SODIUM CHLORIDE 0.9% FLUSH
10.0000 mL | INTRAVENOUS | Status: DC | PRN
  Administered 2020-03-25: 10 mL
  Filled 2020-03-25: qty 10

## 2020-03-25 MED ORDER — PALONOSETRON HCL INJECTION 0.25 MG/5ML
0.2500 mg | Freq: Once | INTRAVENOUS | Status: AC
  Administered 2020-03-25: 0.25 mg via INTRAVENOUS
  Filled 2020-03-25: qty 5

## 2020-03-25 MED ORDER — SODIUM CHLORIDE 0.9 % IV SOLN
5000.0000 mg | INTRAVENOUS | Status: DC
  Administered 2020-03-25: 5000 mg via INTRAVENOUS
  Filled 2020-03-25: qty 100

## 2020-03-25 MED ORDER — OXALIPLATIN CHEMO INJECTION 100 MG/20ML: 175.0000 mg | Freq: Once | INTRAVENOUS | Status: DC

## 2020-03-25 MED ORDER — SODIUM CHLORIDE 0.9 % IV SOLN
5.0000 mg/kg | Freq: Once | INTRAVENOUS | Status: AC
  Administered 2020-03-25: 400 mg via INTRAVENOUS
  Filled 2020-03-25: qty 16

## 2020-03-25 MED ORDER — DEXAMETHASONE SODIUM PHOSPHATE 10 MG/ML IJ SOLN
10.0000 mg | Freq: Once | INTRAMUSCULAR | Status: AC
  Administered 2020-03-25: 10 mg via INTRAVENOUS
  Filled 2020-03-25: qty 1

## 2020-03-25 MED ORDER — SODIUM CHLORIDE 0.9 % IV SOLN
Freq: Once | INTRAVENOUS | Status: AC
  Filled 2020-03-25: qty 250

## 2020-03-25 MED ORDER — LEUCOVORIN CALCIUM INJECTION 350 MG
800.0000 mg | Freq: Once | INTRAVENOUS | Status: AC
  Administered 2020-03-25: 800 mg via INTRAVENOUS
  Filled 2020-03-25: qty 25

## 2020-03-25 MED ORDER — DEXTROSE 5 % IV SOLN
Freq: Once | INTRAVENOUS | Status: AC
  Filled 2020-03-25: qty 250

## 2020-03-25 MED ORDER — OXALIPLATIN CHEMO INJECTION 100 MG/20ML
85.0000 mg/m2 | Freq: Once | INTRAVENOUS | Status: AC
  Administered 2020-03-25: 180 mg via INTRAVENOUS
  Filled 2020-03-25: qty 36

## 2020-03-25 NOTE — Assessment & Plan Note (Addendum)
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  Currently on FOLFOX plus Avastin; PET June 2nd; over all STABLE; ~36mm nodule/slight progression near anastamotic line; CEA-s Stable- 20-25.  Will discuss with Dr. Peyton Najjar.  #Proceed with today- cycle 15 of FOLFOX plus Avastin chemotherapy.  Labs today reviewed;  acceptable for treatment today-except for mild thrombocytopenia platelets 94.   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  Stable  # HTN- systolic 391S stable. Recommend monitoring at home; stable  # Seasonal allergies-on singulair; improved.   # DISPOSITION: #FOLFOX+ Avastin today # follow up in 2 weeks- MD; labs- cbc/cmp/UA/ CEA; FOLFOX + avastin;pump off in 2 days; -Dr.B

## 2020-03-25 NOTE — Progress Notes (Signed)
Today's urinalysis results pending. 03/11/2020 urine protein result: 30. MD, Dr. Rogue Bussing, notified and aware. Per MD order: reference 03/11/2020 urine protein result and proceed with scheduled Zirabev and FOLFOX treatment at this time.

## 2020-03-25 NOTE — Progress Notes (Signed)
[facility] NOTE  Patient Care Team: Sofie Hartigan, MD as PCP - General (Family Medicine) [name], MD as Consulting Physician (Hematology and Oncology)  CHIEF COMPLAINTS/PURPOSE OF CONSULTATION: Colon cancer  #  Oncology History Overview Note  # MAY 2020- 3. 03/09/19 Liver biopsy. Microscopic examination shows malignant cells with glandular architecture consistent with adenocarcinoma. The malignant cells are positive for CK20 and CDX-2. These findings support the clinical impression of metastatic colon adenocarcinoma. 4. 03/10/19 R hemicolectomy. Tumor site cecum. Adenocarcinoma. Mucinous features present. G2. No tumor deposits. Invades visceral peritoneum. No tumor perforation. LVI present. PNI not identified. All margins uninvolved. 1/12 LNs. PT4apN1. Periappendiceal inflammation c/w resolving abscess. Microsatellite stable (MSS). [Dr.Mettu; [facility]]  # SEP 4th 2020 [compared to May 2020]  Interval increase in size of the metastases to the hepatic dome, The metastasis to the left hepatic lobe is unchanged; 2.  New subcentimeter hypoattenuating lesion in the inferior right hepatic lobe, incompletely characterized on CT. 3.  Postsurgical changes following right hemicolectomy.  # OCT 2020- FOLFOX +avastin; CT dec 22nd 2020- [compared to [facility] sep 9th 2020]-Liver- slight progression versus stable disease.  # NGS/F-ONE-MUTATED K-RAS [G]  # PALLIATIVE CARE EVALUATION: 09/20/2019-[name]  # PAIN MANAGEMENT: NA   DIAGNOSIS: COLON CANCER  STAGE:  IV     ;  GOALS:Palliative  CURRENT/MOST RECENT THERAPY : FOLFOX+ avastin [C]    Cancer of right colon ([place])  07/05/2019 Initial Diagnosis   Cancer of right colon ([place])   07/24/2019 -  Chemotherapy   The patient had palonosetron (ALOXI) injection 0.25 mg, 0.25 mg, Intravenous,  Once, 17 of 23 cycles Administration: 0.25 mg (07/24/2019), 0.25 mg (08/07/2019), 0.25 mg (08/21/2019), 0.25 mg (09/04/2019), 0.25 mg (09/20/2019), 0.25 mg  (10/04/2019), 0.25 mg (10/16/2019), 0.25 mg (10/30/2019), 0.25 mg (11/22/2019), 0.25 mg (12/06/2019), 0.25 mg (12/20/2019), 0.25 mg (01/03/2020), 0.25 mg (01/29/2020), 0.25 mg (02/12/2020), 0.25 mg (02/26/2020), 0.25 mg (03/11/2020), 0.25 mg (03/25/2020) leucovorin 800 mg in dextrose 5 % 250 mL infusion, 844 mg, Intravenous,  Once, 17 of 23 cycles Administration: 800 mg (07/24/2019), 800 mg (08/07/2019), 800 mg (08/21/2019), 800 mg (09/04/2019), 800 mg (09/20/2019), 800 mg (10/04/2019), 800 mg (10/16/2019), 800 mg (10/30/2019), 800 mg (11/22/2019), 800 mg (12/06/2019), 800 mg (12/20/2019), 800 mg (01/03/2020), 800 mg (01/29/2020), 800 mg (02/12/2020), 800 mg (02/26/2020), 800 mg (03/11/2020), 800 mg (03/25/2020) oxaliplatin (ELOXATIN) 180 mg in dextrose 5 % 500 mL chemo infusion, 85 mg/m2 = 180 mg, Intravenous,  Once, 17 of 23 cycles Dose modification: 178 mg (original dose 85 mg/m2, Cycle 17, Reason: Other (see comments), Comment: insurance adjusted dose ) Administration: 180 mg (07/24/2019), 180 mg (08/07/2019), 180 mg (08/21/2019), 180 mg (09/04/2019), 180 mg (09/20/2019), 180 mg (10/04/2019), 180 mg (10/16/2019), 180 mg (10/30/2019), 180 mg (11/22/2019), 180 mg (12/06/2019), 180 mg (12/20/2019), 180 mg (01/03/2020), 180 mg (01/29/2020), 180 mg (02/12/2020), 180 mg (02/26/2020), 180 mg (03/11/2020) fluorouracil (ADRUCIL) 5,000 mg in sodium chloride 0.9 % 150 mL chemo infusion, 5,050 mg, Intravenous, 1 Day/Dose, 17 of 23 cycles Administration: 5,000 mg (07/24/2019), 5,000 mg (08/07/2019), 5,000 mg (08/21/2019), 5,050 mg (09/04/2019), 5,000 mg (10/04/2019), 5,000 mg (10/16/2019), 5,000 mg (11/22/2019), 5,000 mg (12/06/2019), 5,000 mg (12/20/2019), 5,000 mg (01/03/2020), 5,000 mg (01/29/2020), 5,000 mg (02/12/2020), 5,000 mg (02/26/2020), 5,000 mg (03/11/2020), 5,000 mg (03/25/2020) bevacizumab-bvzr (ZIRABEV) 400 mg in sodium chloride 0.9 % 100 mL chemo infusion, 5 mg/kg = 400 mg, Intravenous,  Once, 17 of 23 cycles Administration: 400 mg (08/07/2019), 400 mg  (08/21/2019), 400 mg (  09/04/2019), 400 mg (09/20/2019), 400 mg (10/04/2019), 400 mg (10/16/2019), 400 mg (10/30/2019), 400 mg (11/22/2019), 400 mg (12/06/2019), 400 mg (12/20/2019), 400 mg (01/03/2020), 400 mg (01/29/2020), 400 mg (02/12/2020), 400 mg (02/26/2020), 400 mg (03/11/2020), 400 mg (03/25/2020)  for chemotherapy treatment.      HISTORY OF PRESENTING ILLNESS:  Oscar [name] 79 y.o.  male with metastatic colon cancer to the liver currently on FOLFOX plus Avastin is here for follow-up/review results of the PET scan.  Complains of mild tingling and numbness next images.  Not any worse.  Appetite is good.  No weight loss.  No nausea no vomiting.   Review of Systems  Constitutional: Positive for malaise/fatigue. Negative for chills, diaphoresis, fever and weight loss.  HENT: Negative for nosebleeds and sore throat.   Eyes: Negative for double vision.  Respiratory: Negative for cough, hemoptysis, sputum production, shortness of breath and wheezing.   Cardiovascular: Negative for chest pain, palpitations, orthopnea and leg swelling.  Gastrointestinal: Negative for abdominal pain, blood in stool, constipation, diarrhea, heartburn, melena, nausea and vomiting.  Genitourinary: Negative for dysuria, frequency and urgency.  Musculoskeletal: Positive for back pain and joint pain.  Skin: Negative.  Negative for itching and rash.  Neurological: Positive for tingling. Negative for dizziness, focal weakness, weakness and headaches.  Endo/Heme/Allergies: Does not bruise/bleed easily.  Psychiatric/Behavioral: Negative for depression. The patient is not nervous/anxious and does not have insomnia.      MEDICAL HISTORY:  Past Medical History:  Diagnosis Date  . Arthritis   . Cancer ([place])    colon cancer 02/2019 per pt   . Diabetes mellitus without complication ([place])   . H/O colon cancer, stage IV   . Hyperlipemia   . Hypertension     SURGICAL HISTORY: Past Surgical History:  Procedure Laterality Date   . IR IMAGING GUIDED PORT INSERTION  07/20/2019  . JOINT REPLACEMENT      SOCIAL HISTORY: Social History   Socioeconomic History  . Marital status: Married    Spouse name: Not on file  . Number of children: Not on file  . Years of education: Not on file  . Highest education level: Not on file  Occupational History  . Not on file  Tobacco Use  . Smoking status: Current Every Day Smoker    Packs/day: 0.25    Types: Cigarettes  . Smokeless tobacco: Never Used  . Tobacco comment: 1 to 2 cigarettes a day occasionally  Vaping Use  . Vaping Use: Never used  Substance and Sexual Activity  . Alcohol use: No  . Drug use: No  . Sexual activity: Not on file  Other Topics Concern  . Not on file  Social History Narrative   Recruitment consultant retd; lives in [place]; smoking 3cig/day; [3/4 ppd x started at 9 years]; no alcohol. Son & daughter; wife dementia [waiting for placement].    Social Determinants of Health   Financial Resource Strain:   . Difficulty of Paying Living Expenses:   Food Insecurity:   . Worried About Charity fundraiser in the Last Year:   . Arboriculturist in the Last Year:   Transportation Needs:   . Film/video editor (Medical):   Marland Kitchen Lack of Transportation (Non-Medical):   Physical Activity:   . Days of Exercise per Week:   . Minutes of Exercise per Session:   Stress:   . Feeling of Stress :   Social Connections:   . Frequency of Communication with Friends and Family:   .  Frequency of Social Gatherings with Friends and Family:   . Attends Religious Services:   . Active Member of Clubs or Organizations:   . Attends Archivist Meetings:   Marland Kitchen Marital Status:   Intimate Partner Violence:   . Fear of Current or Ex-Partner:   . Emotionally Abused:   Marland Kitchen Physically Abused:   . Sexually Abused:     FAMILY HISTORY: Family History  Problem Relation Age of Onset  . Peptic Ulcer Disease Father     ALLERGIES:  is allergic to ace  inhibitors.  MEDICATIONS:  Current Outpatient Medications  Medication Sig Dispense Refill  . amLODipine (NORVASC) 5 MG tablet Take 5 mg by mouth daily.    . fluticasone (FLONASE) 50 MCG/ACT nasal spray Place 1 spray into both nostrils daily as needed.     . meloxicam (MOBIC) 15 MG tablet Take 1 tablet (15 mg total) by mouth daily. 90 tablet 0  . pravastatin (PRAVACHOL) 40 MG tablet Take 40 mg by mouth daily.    . tamsulosin (FLOMAX) 0.4 MG CAPS capsule Take 1 capsule (0.4 mg total) by mouth daily. 30 capsule 11  . lidocaine-prilocaine (EMLA) cream Apply 1 application topically as needed. (Patient not taking: Reported on 12/05/2019) 30 g 0  . montelukast (SINGULAIR) 10 MG tablet TAKE 1 TABLET BY MOUTH EVERYDAY AT BEDTIME (Patient not taking: Reported on 03/25/2020) 90 tablet 1  . ondansetron (ZOFRAN-ODT) 4 MG disintegrating tablet Take 1 tablet (4 mg total) by mouth every 6 (six) hours as needed for nausea. (Patient not taking: Reported on 12/05/2019) 20 tablet 0  . prochlorperazine (COMPAZINE) 10 MG tablet Take 1 tablet (10 mg total) by mouth every 6 (six) hours as needed for nausea or vomiting. (Patient not taking: Reported on 12/05/2019) 40 tablet 1   No current facility-administered medications for this visit.      Marland Kitchen  PHYSICAL EXAMINATION: ECOG PERFORMANCE STATUS: 0 - Asymptomatic  Vitals:   03/25/20 0911  BP: (!) 168/78  Pulse: 65  Temp: 97.8 F (36.6 C)   Filed Weights   03/25/20 0911  Weight: 192 lb 14.4 oz (87.5 kg)    Physical Exam  Constitutional: He is oriented to person, place, and time and well-developed, well-nourished, and in no distress.  HENT:  Head: Normocephalic and atraumatic.  Mouth/Throat: Oropharynx is clear and moist. No oropharyngeal exudate.  Eyes: Pupils are equal, round, and reactive to light.  Cardiovascular: Normal rate and regular rhythm.  Pulmonary/Chest: No respiratory distress. He has no wheezes.  Abdominal: Soft. Bowel sounds are normal. He  exhibits no distension and no mass. There is no abdominal tenderness. There is no rebound and no guarding.  Musculoskeletal:        General: No tenderness or edema. Normal range of motion.     Cervical back: Normal range of motion and neck supple.  Neurological: He is alert and oriented to person, place, and time.  Skin: Skin is warm.  Psychiatric: Affect normal.   LABORATORY DATA:  I have reviewed the data as listed Lab Results  Component Value Date   WBC 4.5 03/25/2020   HGB 12.8 (L) 03/25/2020   HCT 36.7 (L) 03/25/2020   MCV 98.1 03/25/2020   PLT 94 (L) 03/25/2020   Recent Labs    02/26/20 0904 03/11/20 0809 03/25/20 0851  NA 138 137 141  K 3.5 3.6 3.8  CL 105 106 108  CO2 _0 GLUCOSE 199* 189* 162*  BUN 11 10 10  CREATININE 0.74 0.86 0.75  CALCIUM 8.0* 8.2* 8.4*  GFRNONAA >60 >60 >60  GFRAA >60 >60 >60  PROT 6.6 6.7 6.7  ALBUMIN 3.2* 3.3* 3.3*  AST 32 32 28  ALT _0 ALKPHOS 108 116 110  BILITOT 0.7 0.6 0.8    RADIOGRAPHIC STUDIES: I have personally reviewed the radiological images as listed and agreed with the findings in the report. NM PET Image Restag (PS) Skull Base To Thigh  Result Date: 03/20/2020 CLINICAL DATA:  Subsequent treatment strategy for colon cancer with liver metastasis. Status post right hemicolectomy. EXAM: NUCLEAR MEDICINE PET SKULL BASE TO THIGH TECHNIQUE: 10.7 mCi F-18 FDG was injected intravenously. Full-ring PET imaging was performed from the skull base to thigh after the radiotracer. CT data was obtained and used for attenuation correction and anatomic localization. Fasting blood glucose: 107 mg/dl COMPARISON:  Abdominopelvic CT 01/12/2020. Chest abdomen pelvic CTs of 10/10/2019 FINDINGS: Mediastinal blood pool activity: SUV max 2.2 Liver activity: SUV max NA NECK: No areas of abnormal hypermetabolism. Incidental CT findings: No cervical adenopathy. Bilateral carotid atherosclerosis. CHEST: No pulmonary parenchymal or thoracic nodal  hypermetabolism. Incidental CT findings: Right Port-A-Cath tip superior caval/atrial junction. Aortic and coronary artery atherosclerosis. Left lower lobe pulmonary nodule of 4 mm on 134/3 is below PET resolution. ABDOMEN/PELVIS: Hypermetabolism corresponding to a segment 2/3 liver lesion. 3.5 x 2.9 cm and a S.U.V. max of 7.7 on 153/3. Similar in size to 3.5 x 3.3 cm on the prior exam (when remeasured). The soft tissue nodule adjacent the right hemicolectomy anastomosis measures 1.7 cm and a S.U.V. max of 3.0 on 204/3. Compare 1.5 cm on 01/12/2020. Incidental CT findings: Hepatic dome 6.5 cm cyst. Punctate left renal collecting system calculi. Faint gallstone. Lower pole left renal cyst. Abdominal aortic atherosclerosis. SKELETON: No abnormal marrow activity. Incidental CT findings: Bilateral hip osteoarthritis. IMPRESSION: 1. Hypermetabolic left hepatic lobe metastasis. 2. Right hemicolectomy with hypermetabolic, enlarging metastatic implant adjacent the anastomosis. 3. Incidental findings, including: Coronary artery atherosclerosis. Aortic Atherosclerosis (ICD10-I70.0). Left nephrolithiasis. Cholelithiasis. Electronically Signed   By: Abigail Miyamoto M.D.   On: 03/20/2020 15:17    ASSESSMENT & PLAN:   Cancer of right colon ([place]) #Right-sided colon adenocarcinoma-with synchronous metastasis to liver/unresectable.  Currently on FOLFOX plus Avastin; PET June 2nd; over all STABLE; ~24m nodule/slight progression near anastamotic line; CEA-s Stable- 20-25.  Will discuss with Dr. CPeyton Najjar  #Proceed with today- cycle 15 of FOLFOX plus Avastin chemotherapy.  Labs today reviewed;  acceptable for treatment today-except for mild thrombocytopenia platelets 94.   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  Stable  # HTN- systolic 1433Istable. Recommend monitoring at home; stable  # Seasonal allergies-on singulair; improved.   # DISPOSITION: #FOLFOX+ Avastin today # follow up in 2 weeks- MD; labs- cbc/cmp/UA/ CEA; FOLFOX +  avastin;pump off in 2 days; -Dr.B  All questions were answered. The patient knows to call the clinic with any problems, questions or concerns.    GCammie Sickle MD 03/29/2020 1:13 PM

## 2020-03-26 LAB — CEA: CEA: 25.2 ng/mL — ABNORMAL HIGH (ref 0.0–4.7)

## 2020-03-27 ENCOUNTER — Inpatient Hospital Stay: Payer: Medicare HMO

## 2020-03-27 ENCOUNTER — Other Ambulatory Visit: Payer: Self-pay

## 2020-03-27 DIAGNOSIS — C182 Malignant neoplasm of ascending colon: Secondary | ICD-10-CM | POA: Diagnosis not present

## 2020-03-27 DIAGNOSIS — Z5111 Encounter for antineoplastic chemotherapy: Secondary | ICD-10-CM | POA: Diagnosis not present

## 2020-03-27 DIAGNOSIS — C787 Secondary malignant neoplasm of liver and intrahepatic bile duct: Secondary | ICD-10-CM | POA: Diagnosis not present

## 2020-03-27 DIAGNOSIS — Z7189 Other specified counseling: Secondary | ICD-10-CM

## 2020-03-27 DIAGNOSIS — Z5112 Encounter for antineoplastic immunotherapy: Secondary | ICD-10-CM | POA: Diagnosis not present

## 2020-03-27 MED ORDER — HEPARIN SOD (PORK) LOCK FLUSH 100 UNIT/ML IV SOLN
500.0000 [IU] | Freq: Once | INTRAVENOUS | Status: AC | PRN
  Administered 2020-03-27: 500 [IU]
  Filled 2020-03-27: qty 5

## 2020-03-27 MED ORDER — HEPARIN SOD (PORK) LOCK FLUSH 100 UNIT/ML IV SOLN
INTRAVENOUS | Status: AC
  Filled 2020-03-27: qty 5

## 2020-03-27 MED ORDER — SODIUM CHLORIDE 0.9% FLUSH
10.0000 mL | INTRAVENOUS | Status: DC | PRN
  Administered 2020-03-27: 10 mL
  Filled 2020-03-27: qty 10

## 2020-04-02 ENCOUNTER — Inpatient Hospital Stay (HOSPITAL_BASED_OUTPATIENT_CLINIC_OR_DEPARTMENT_OTHER): Payer: Medicare HMO | Admitting: Hospice and Palliative Medicine

## 2020-04-02 DIAGNOSIS — Z515 Encounter for palliative care: Secondary | ICD-10-CM

## 2020-04-02 DIAGNOSIS — C182 Malignant neoplasm of ascending colon: Secondary | ICD-10-CM | POA: Diagnosis not present

## 2020-04-02 NOTE — Progress Notes (Signed)
Virtual Visit via Telephone Note  I connected with Oscar [name] on 04/02/20 at 11:30 AM EDT by telephone and verified that I am speaking with the correct person using two identifiers.   I discussed the limitations, risks, security and privacy concerns of performing an evaluation and management service by telephone and the availability of in person appointments. I also discussed with the patient that there may be a patient responsible charge related to this service. The patient expressed understanding and agreed to proceed.   History of Present Illness: [name] is a 79 year old man with multiple medical problems including stage IV colorectal cancer currently on systemic treatment with FOLFOX.   Observations/Objective: Virtual visit attempted but had to call patient instead.  Patient reports that he is doing well.  He denies any significant changes or concerns.  No symptomatic complaints today.  Patient reports good oral intake.  Stable performance status.  He still playing 9 holes of golf each week with his brother.  No issues with medication or need for refills today.  Assessment and Plan: Stage IV colorectal cancer -on treatment with FOLFOX plus Avastin.  Patient is followed by Dr. Rogue Bussing.  PET scan 6/2 revealed stable disease.  We will follow  Follow Up Instructions: Follow-up MyChart visit in 1 to 2 months   I discussed the assessment and treatment plan with the patient. The patient was provided an opportunity to ask questions and all were answered. The patient agreed with the plan and demonstrated an understanding of the instructions.   The patient was advised to call back or seek an in-person evaluation if the symptoms worsen or if the condition fails to improve as anticipated.  I provided 5 minutes of non-face-to-face time during this encounter.   [name], NP

## 2020-04-08 ENCOUNTER — Encounter: Payer: Self-pay | Admitting: Internal Medicine

## 2020-04-08 ENCOUNTER — Inpatient Hospital Stay: Payer: Medicare HMO | Admitting: Internal Medicine

## 2020-04-08 ENCOUNTER — Inpatient Hospital Stay: Payer: Medicare HMO

## 2020-04-08 ENCOUNTER — Other Ambulatory Visit: Payer: Self-pay

## 2020-04-08 DIAGNOSIS — Z5111 Encounter for antineoplastic chemotherapy: Secondary | ICD-10-CM | POA: Diagnosis not present

## 2020-04-08 DIAGNOSIS — C787 Secondary malignant neoplasm of liver and intrahepatic bile duct: Secondary | ICD-10-CM | POA: Diagnosis not present

## 2020-04-08 DIAGNOSIS — C182 Malignant neoplasm of ascending colon: Secondary | ICD-10-CM

## 2020-04-08 DIAGNOSIS — Z5112 Encounter for antineoplastic immunotherapy: Secondary | ICD-10-CM | POA: Diagnosis not present

## 2020-04-08 DIAGNOSIS — Z7189 Other specified counseling: Secondary | ICD-10-CM

## 2020-04-08 LAB — CBC WITH DIFFERENTIAL/PLATELET
Abs Immature Granulocytes: 0.01 10*3/uL (ref 0.00–0.07)
Basophils Absolute: 0.1 10*3/uL (ref 0.0–0.1)
Basophils Relative: 1 %
Eosinophils Absolute: 0.4 10*3/uL (ref 0.0–0.5)
Eosinophils Relative: 9 %
HCT: 38.5 % — ABNORMAL LOW (ref 39.0–52.0)
Hemoglobin: 13.1 g/dL (ref 13.0–17.0)
Immature Granulocytes: 0 %
Lymphocytes Relative: 32 %
Lymphs Abs: 1.3 10*3/uL (ref 0.7–4.0)
MCH: 33.9 pg (ref 26.0–34.0)
MCHC: 34 g/dL (ref 30.0–36.0)
MCV: 99.5 fL (ref 80.0–100.0)
Monocytes Absolute: 0.6 10*3/uL (ref 0.1–1.0)
Monocytes Relative: 14 %
Neutro Abs: 1.8 10*3/uL (ref 1.7–7.7)
Neutrophils Relative %: 44 %
Platelets: 93 10*3/uL — ABNORMAL LOW (ref 150–400)
RBC: 3.87 MIL/uL — ABNORMAL LOW (ref 4.22–5.81)
RDW: 14.6 % (ref 11.5–15.5)
WBC: 4.1 10*3/uL (ref 4.0–10.5)
nRBC: 0 % (ref 0.0–0.2)

## 2020-04-08 LAB — COMPREHENSIVE METABOLIC PANEL
ALT: 25 U/L (ref 0–44)
AST: 36 U/L (ref 15–41)
Albumin: 3.3 g/dL — ABNORMAL LOW (ref 3.5–5.0)
Alkaline Phosphatase: 115 U/L (ref 38–126)
Anion gap: 8 (ref 5–15)
BUN: 9 mg/dL (ref 8–23)
CO2: 24 mmol/L (ref 22–32)
Calcium: 8.3 mg/dL — ABNORMAL LOW (ref 8.9–10.3)
Chloride: 107 mmol/L (ref 98–111)
Creatinine, Ser: 0.85 mg/dL (ref 0.61–1.24)
GFR calc Af Amer: >60 mL/min (ref >60)
GFR calc non Af Amer: >60 mL/min (ref >60)
Glucose, Bld: 185 mg/dL — ABNORMAL HIGH (ref 70–99)
Potassium: 3.8 mmol/L (ref 3.5–5.1)
Sodium: 139 mmol/L (ref 135–145)
Total Bilirubin: 0.7 mg/dL (ref 0.3–1.2)
Total Protein: 6.7 g/dL (ref 6.5–8.1)

## 2020-04-08 LAB — URINALYSIS, COMPLETE (UACMP) WITH MICROSCOPIC
Bacteria, UA: NONE SEEN
Bilirubin Urine: NEGATIVE
Glucose, UA: 150 mg/dL — AB
Hgb urine dipstick: NEGATIVE
Ketones, ur: 5 mg/dL — AB
Leukocytes,Ua: NEGATIVE
Nitrite: NEGATIVE
Protein, ur: 30 mg/dL — AB
Specific Gravity, Urine: 1.031 — ABNORMAL HIGH (ref 1.005–1.030)
pH: 5 (ref 5.0–8.0)

## 2020-04-08 MED ORDER — LEUCOVORIN CALCIUM INJECTION 350 MG
379.0000 mg/m2 | Freq: Once | INTRAVENOUS | Status: AC
  Administered 2020-04-08: 800 mg via INTRAVENOUS
  Filled 2020-04-08: qty 10

## 2020-04-08 MED ORDER — OXALIPLATIN CHEMO INJECTION 100 MG/20ML
178.0000 mg | Freq: Once | INTRAVENOUS | Status: AC
  Administered 2020-04-08: 180 mg via INTRAVENOUS
  Filled 2020-04-08: qty 36

## 2020-04-08 MED ORDER — PALONOSETRON HCL INJECTION 0.25 MG/5ML
0.2500 mg | Freq: Once | INTRAVENOUS | Status: AC
  Administered 2020-04-08: 0.25 mg via INTRAVENOUS
  Filled 2020-04-08: qty 5

## 2020-04-08 MED ORDER — SODIUM CHLORIDE 0.9 % IV SOLN
2369.0000 mg/m2 | INTRAVENOUS | Status: DC
  Administered 2020-04-08: 5000 mg via INTRAVENOUS
  Filled 2020-04-08: qty 100

## 2020-04-08 MED ORDER — HEPARIN SOD (PORK) LOCK FLUSH 100 UNIT/ML IV SOLN
500.0000 [IU] | Freq: Once | INTRAVENOUS | Status: DC | PRN
  Filled 2020-04-08: qty 5

## 2020-04-08 MED ORDER — SODIUM CHLORIDE 0.9 % IV SOLN
INTRAVENOUS | Status: DC
  Filled 2020-04-08 (×2): qty 250

## 2020-04-08 MED ORDER — SODIUM CHLORIDE 0.9 % IV SOLN
5.0000 mg/kg | Freq: Once | INTRAVENOUS | Status: AC
  Administered 2020-04-08: 400 mg via INTRAVENOUS
  Filled 2020-04-08: qty 16

## 2020-04-08 MED ORDER — HEPARIN SOD (PORK) LOCK FLUSH 100 UNIT/ML IV SOLN
500.0000 [IU] | Freq: Once | INTRAVENOUS | Status: DC
  Filled 2020-04-08: qty 5

## 2020-04-08 MED ORDER — DEXAMETHASONE SODIUM PHOSPHATE 10 MG/ML IJ SOLN
10.0000 mg | Freq: Once | INTRAMUSCULAR | Status: AC
  Administered 2020-04-08: 10 mg via INTRAVENOUS
  Filled 2020-04-08: qty 1

## 2020-04-08 MED ORDER — DEXTROSE 5 % IV SOLN
Freq: Once | INTRAVENOUS | Status: AC
  Filled 2020-04-08: qty 250

## 2020-04-08 MED ORDER — SODIUM CHLORIDE 0.9% FLUSH
10.0000 mL | Freq: Once | INTRAVENOUS | Status: AC
  Administered 2020-04-08: 10 mL via INTRAVENOUS
  Filled 2020-04-08: qty 10

## 2020-04-08 NOTE — Assessment & Plan Note (Addendum)
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  Currently on FOLFOX plus Avastin; PET June 2nd; over all STABLE; ~79mm nodule/slight progression near anastamotic line; CEA-s Stable- 20-25.  Discussed with Dr. Chriss Driver liver lesions; recommend surveillance of the anastomotic lesion.  #Proceed with today- cycle 16 of FOLFOX plus Avastin chemotherapy.  Labs today reviewed;  acceptable for treatment today-except for mild thrombocytopenia platelets 93.   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  STABLE>   # HTN- systolic 770H stable. Recommend monitoring at home; STABLE>  # DISPOSITION: #FOLFOX+ Avastin today # follow up in 2 weeks- MD; labs- cbc/cmp/UA/ CEA; FOLFOX + avastin;pump off in 2 days; -Dr.B

## 2020-04-08 NOTE — Progress Notes (Signed)
[place] NOTE  Patient Care Team: Sofie Hartigan, MD as PCP - General (Family Medicine) [name], MD as Consulting Physician (Hematology and Oncology)  CHIEF COMPLAINTS/PURPOSE OF CONSULTATION: Colon cancer  #  Oncology History Overview Note  # MAY 2020- 3. 03/09/19 Liver biopsy. Microscopic examination shows malignant cells with glandular architecture consistent with adenocarcinoma. The malignant cells are positive for CK20 and CDX-2. These findings support the clinical impression of metastatic colon adenocarcinoma. 4. 03/10/19 R hemicolectomy. Tumor site cecum. Adenocarcinoma. Mucinous features present. G2. No tumor deposits. Invades visceral peritoneum. No tumor perforation. LVI present. PNI not identified. All margins uninvolved. 1/12 LNs. PT4apN1. Periappendiceal inflammation c/w resolving abscess. Microsatellite stable (MSS). [Dr.Mettu; [facility]]  # SEP 4th 2020 [compared to May 2020]  Interval increase in size of the metastases to the hepatic dome, The metastasis to the left hepatic lobe is unchanged; 2.  New subcentimeter hypoattenuating lesion in the inferior right hepatic lobe, incompletely characterized on CT. 3.  Postsurgical changes following right hemicolectomy.  # OCT 2020- FOLFOX +avastin; CT dec 22nd 2020- [compared to [facility] sep 9th 2020]-Liver- slight progression versus stable disease.  # NGS/F-ONE-MUTATED K-RAS [G]  # PALLIATIVE CARE EVALUATION: 09/20/2019-Oscar [name]  # PAIN MANAGEMENT: NA   DIAGNOSIS: COLON CANCER  STAGE:  IV     ;  GOALS:Palliative  CURRENT/MOST RECENT THERAPY : FOLFOX+ avastin [C]    Cancer of right colon ([place])  07/05/2019 Initial Diagnosis   Cancer of right colon ([place])   07/24/2019 -  Chemotherapy   The patient had dexamethasone (DECADRON) 4 MG tablet, 8 mg, Oral, Daily, 1 of 1 cycle, Start date: --, End date: -- palonosetron (ALOXI) injection 0.25 mg, 0.25 mg, Intravenous,  Once, 18 of 23 cycles Administration: 0.25 mg  (07/24/2019), 0.25 mg (08/07/2019), 0.25 mg (08/21/2019), 0.25 mg (09/04/2019), 0.25 mg (09/20/2019), 0.25 mg (10/04/2019), 0.25 mg (10/16/2019), 0.25 mg (10/30/2019), 0.25 mg (11/22/2019), 0.25 mg (12/06/2019), 0.25 mg (12/20/2019), 0.25 mg (01/03/2020), 0.25 mg (01/29/2020), 0.25 mg (02/12/2020), 0.25 mg (02/26/2020), 0.25 mg (03/11/2020), 0.25 mg (03/25/2020) leucovorin 800 mg in dextrose 5 % 250 mL infusion, 844 mg, Intravenous,  Once, 18 of 23 cycles Administration: 800 mg (07/24/2019), 800 mg (08/07/2019), 800 mg (08/21/2019), 800 mg (09/04/2019), 800 mg (09/20/2019), 800 mg (10/04/2019), 800 mg (10/16/2019), 800 mg (10/30/2019), 800 mg (11/22/2019), 800 mg (12/06/2019), 800 mg (12/20/2019), 800 mg (01/03/2020), 800 mg (01/29/2020), 800 mg (02/12/2020), 800 mg (02/26/2020), 800 mg (03/11/2020), 800 mg (03/25/2020) oxaliplatin (ELOXATIN) 180 mg in dextrose 5 % 500 mL chemo infusion, 85 mg/m2 = 180 mg, Intravenous,  Once, 18 of 23 cycles Dose modification: 178 mg (original dose 85 mg/m2, Cycle 17, Reason: Other (see comments), Comment: insurance adjusted dose ) Administration: 180 mg (07/24/2019), 180 mg (08/07/2019), 180 mg (08/21/2019), 180 mg (09/04/2019), 180 mg (09/20/2019), 180 mg (10/04/2019), 180 mg (10/16/2019), 180 mg (10/30/2019), 180 mg (11/22/2019), 180 mg (12/06/2019), 180 mg (12/20/2019), 180 mg (01/03/2020), 180 mg (01/29/2020), 180 mg (02/12/2020), 180 mg (02/26/2020), 180 mg (03/11/2020) fluorouracil (ADRUCIL) 5,000 mg in sodium chloride 0.9 % 150 mL chemo infusion, 5,050 mg, Intravenous, 1 Day/Dose, 18 of 23 cycles Administration: 5,000 mg (07/24/2019), 5,000 mg (08/07/2019), 5,000 mg (08/21/2019), 5,050 mg (09/04/2019), 5,000 mg (10/04/2019), 5,000 mg (10/16/2019), 5,000 mg (11/22/2019), 5,000 mg (12/06/2019), 5,000 mg (12/20/2019), 5,000 mg (01/03/2020), 5,000 mg (01/29/2020), 5,000 mg (02/12/2020), 5,000 mg (02/26/2020), 5,000 mg (03/11/2020), 5,000 mg (03/25/2020) bevacizumab-bvzr (ZIRABEV) 400 mg in sodium chloride 0.9 % 100 mL chemo  infusion, 5 mg/kg =  400 mg, Intravenous,  Once, 18 of 23 cycles Administration: 400 mg (08/07/2019), 400 mg (08/21/2019), 400 mg (09/04/2019), 400 mg (09/20/2019), 400 mg (10/04/2019), 400 mg (10/16/2019), 400 mg (10/30/2019), 400 mg (11/22/2019), 400 mg (12/06/2019), 400 mg (12/20/2019), 400 mg (01/03/2020), 400 mg (01/29/2020), 400 mg (02/12/2020), 400 mg (02/26/2020), 400 mg (03/11/2020), 400 mg (03/25/2020)  for chemotherapy treatment.      HISTORY OF PRESENTING ILLNESS:  Oscar [name] 79 y.o.  male with metastatic colon cancer to the liver currently on FOLFOX plus Avastin is here for follow-up.  Patient has any nausea vomiting but appetite is good.  No weight loss.  Continues to have mild tingling and numbness in extremities.  Review of Systems  Constitutional: Positive for malaise/fatigue. Negative for chills, diaphoresis, fever and weight loss.  HENT: Negative for nosebleeds and sore throat.   Eyes: Negative for double vision.  Respiratory: Negative for cough, hemoptysis, sputum production, shortness of breath and wheezing.   Cardiovascular: Negative for chest pain, palpitations, orthopnea and leg swelling.  Gastrointestinal: Negative for abdominal pain, blood in stool, constipation, diarrhea, heartburn, melena, nausea and vomiting.  Genitourinary: Negative for dysuria, frequency and urgency.  Musculoskeletal: Positive for back pain and joint pain.  Skin: Negative.  Negative for itching and rash.  Neurological: Positive for tingling. Negative for dizziness, focal weakness, weakness and headaches.  Endo/Heme/Allergies: Does not bruise/bleed easily.  Psychiatric/Behavioral: Negative for depression. The patient is not nervous/anxious and does not have insomnia.      MEDICAL HISTORY:  Past Medical History:  Diagnosis Date  . Arthritis   . Cancer ([place])    colon cancer 02/2019 per pt   . Diabetes mellitus without complication ([place])   . H/O colon cancer, stage IV   . Hyperlipemia   .  Hypertension     SURGICAL HISTORY: Past Surgical History:  Procedure Laterality Date  . IR IMAGING GUIDED PORT INSERTION  07/20/2019  . JOINT REPLACEMENT      SOCIAL HISTORY: Social History   Socioeconomic History  . Marital status: Married    Spouse name: Not on file  . Number of children: Not on file  . Years of education: Not on file  . Highest education level: Not on file  Occupational History  . Not on file  Tobacco Use  . Smoking status: Current Every Day Smoker    Packs/day: 0.25    Types: Cigarettes  . Smokeless tobacco: Never Used  . Tobacco comment: 1 to 2 cigarettes a day occasionally  Vaping Use  . Vaping Use: Never used  Substance and Sexual Activity  . Alcohol use: No  . Drug use: No  . Sexual activity: Not on file  Other Topics Concern  . Not on file  Social History Narrative   Recruitment consultant retd; lives in [place]; smoking 3cig/day; [3/4 ppd x started at 9 years]; no alcohol. Son & daughter; wife dementia [waiting for placement].    Social Determinants of Health   Financial Resource Strain:   . Difficulty of Paying Living Expenses:   Food Insecurity:   . Worried About Charity fundraiser in the Last Year:   . Arboriculturist in the Last Year:   Transportation Needs:   . Film/video editor (Medical):   Marland Kitchen Lack of Transportation (Non-Medical):   Physical Activity:   . Days of Exercise per Week:   . Minutes of Exercise per Session:   Stress:   . Feeling of Stress :   Social Connections:   .  Frequency of Communication with Friends and Family:   . Frequency of Social Gatherings with Friends and Family:   . Attends Religious Services:   . Active Member of Clubs or Organizations:   . Attends Archivist Meetings:   Marland Kitchen Marital Status:   Intimate Partner Violence:   . Fear of Current or Ex-Partner:   . Emotionally Abused:   Marland Kitchen Physically Abused:   . Sexually Abused:     FAMILY HISTORY: Family History  Problem  Relation Age of Onset  . Peptic Ulcer Disease Father     ALLERGIES:  is allergic to ace inhibitors.  MEDICATIONS:  Current Outpatient Medications  Medication Sig Dispense Refill  . amLODipine (NORVASC) 5 MG tablet Take 5 mg by mouth daily.    . fluticasone (FLONASE) 50 MCG/ACT nasal spray Place 1 spray into both nostrils daily as needed.     . meloxicam (MOBIC) 15 MG tablet Take 1 tablet (15 mg total) by mouth daily. 90 tablet 0  . pravastatin (PRAVACHOL) 40 MG tablet Take 40 mg by mouth daily.    . tamsulosin (FLOMAX) 0.4 MG CAPS capsule Take 1 capsule (0.4 mg total) by mouth daily. 30 capsule 11  . lidocaine-prilocaine (EMLA) cream Apply 1 application topically as needed. (Patient not taking: Reported on 12/05/2019) 30 g 0  . montelukast (SINGULAIR) 10 MG tablet TAKE 1 TABLET BY MOUTH EVERYDAY AT BEDTIME (Patient not taking: Reported on 03/25/2020) 90 tablet 1  . ondansetron (ZOFRAN-ODT) 4 MG disintegrating tablet Take 1 tablet (4 mg total) by mouth every 6 (six) hours as needed for nausea. (Patient not taking: Reported on 12/05/2019) 20 tablet 0  . prochlorperazine (COMPAZINE) 10 MG tablet Take 1 tablet (10 mg total) by mouth every 6 (six) hours as needed for nausea or vomiting. (Patient not taking: Reported on 12/05/2019) 40 tablet 1   No current facility-administered medications for this visit.   Facility-Administered Medications Ordered in Other Visits  Medication Dose Route Frequency Provider Last Rate Last Admin  . 0.9 %  sodium chloride infusion   Intravenous Continuous Cammie Sickle, MD 10 mL/hr at 04/08/20 0934 New Bag at 04/08/20 0934  . bevacizumab-bvzr (ZIRABEV) 400 mg in sodium chloride 0.9 % 100 mL chemo infusion  5 mg/kg (Treatment Plan Recorded) Intravenous Once Charlaine Dalton R, MD      . dextrose 5 % solution   Intravenous Once Charlaine Dalton R, MD      . fluorouracil (ADRUCIL) 5,000 mg in sodium chloride 0.9 % 150 mL chemo infusion  2,369 mg/m2 (Treatment  Plan Recorded) Intravenous 1 day or 1 dose Charlaine Dalton R, MD      . heparin lock flush 100 unit/mL  500 Units Intravenous Once Charlaine Dalton R, MD      . heparin lock flush 100 unit/mL  500 Units Intracatheter Once PRN Charlaine Dalton R, MD      . leucovorin 800 mg in dextrose 5 % 250 mL infusion  379 mg/m2 (Treatment Plan Recorded) Intravenous Once Charlaine Dalton R, MD      . oxaliplatin (ELOXATIN) 180 mg in dextrose 5 % 500 mL chemo infusion  180 mg Intravenous Once Cammie Sickle, MD          .  PHYSICAL EXAMINATION: ECOG PERFORMANCE STATUS: 0 - Asymptomatic  Vitals:   04/08/20 0837  BP: 133/74  Pulse: (!) 57  Resp: 16  Temp: (!) 97.3 F (36.3 C)  SpO2: 99%   Filed Weights   04/08/20 0837  Weight: 192 lb 12.8 oz (87.5 kg)    Physical Exam HENT:     Head: Normocephalic and atraumatic.     Mouth/Throat:     Pharynx: No oropharyngeal exudate.  Eyes:     Pupils: Pupils are equal, round, and reactive to light.  Cardiovascular:     Rate and Rhythm: Normal rate and regular rhythm.  Pulmonary:     Effort: No respiratory distress.     Breath sounds: No wheezing.  Abdominal:     General: Bowel sounds are normal. There is no distension.     Palpations: Abdomen is soft. There is no mass.     Tenderness: There is no abdominal tenderness. There is no guarding or rebound.  Musculoskeletal:        General: No tenderness. Normal range of motion.     Cervical back: Normal range of motion and neck supple.  Skin:    General: Skin is warm.  Neurological:     Mental Status: He is alert and oriented to person, place, and time.  Psychiatric:        Mood and Affect: Affect normal.    LABORATORY DATA:  I have reviewed the data as listed Lab Results  Component Value Date   WBC 4.1 04/08/2020   HGB 13.1 04/08/2020   HCT 38.5 (L) 04/08/2020   MCV 99.5 04/08/2020   PLT 93 (L) 04/08/2020   Recent Labs    03/11/20 0809 03/25/20 0851 04/08/20 0826   NA 137 141 139  K 3.6 3.8 3.8  CL 106 108 107  CO2 _0 GLUCOSE 189* 162* 185*  BUN _1 CREATININE 0.86 0.75 0.85  CALCIUM 8.2* 8.4* 8.3*  GFRNONAA >60 >60 >60  GFRAA >60 >60 >60  PROT 6.7 6.7 6.7  ALBUMIN 3.3* 3.3* 3.3*  AST 32 28 36  ALT _2 ALKPHOS 116 110 115  BILITOT 0.6 0.8 0.7    RADIOGRAPHIC STUDIES: I have personally reviewed the radiological images as listed and agreed with the findings in the report. NM PET Image Restag (PS) Skull Base To Thigh  Result Date: 03/20/2020 CLINICAL DATA:  Subsequent treatment strategy for colon cancer with liver metastasis. Status post right hemicolectomy. EXAM: NUCLEAR MEDICINE PET SKULL BASE TO THIGH TECHNIQUE: 10.7 mCi F-18 FDG was injected intravenously. Full-ring PET imaging was performed from the skull base to thigh after the radiotracer. CT data was obtained and used for attenuation correction and anatomic localization. Fasting blood glucose: 107 mg/dl COMPARISON:  Abdominopelvic CT 01/12/2020. Chest abdomen pelvic CTs of 10/10/2019 FINDINGS: Mediastinal blood pool activity: SUV max 2.2 Liver activity: SUV max NA NECK: No areas of abnormal hypermetabolism. Incidental CT findings: No cervical adenopathy. Bilateral carotid atherosclerosis. CHEST: No pulmonary parenchymal or thoracic nodal hypermetabolism. Incidental CT findings: Right Port-A-Cath tip superior caval/atrial junction. Aortic and coronary artery atherosclerosis. Left lower lobe pulmonary nodule of 4 mm on 134/3 is below PET resolution. ABDOMEN/PELVIS: Hypermetabolism corresponding to a segment 2/3 liver lesion. 3.5 x 2.9 cm and a S.U.V. max of 7.7 on 153/3. Similar in size to 3.5 x 3.3 cm on the prior exam (when remeasured). The soft tissue nodule adjacent the right hemicolectomy anastomosis measures 1.7 cm and a S.U.V. max of 3.0 on 204/3. Compare 1.5 cm on 01/12/2020. Incidental CT findings: Hepatic dome 6.5 cm cyst. Punctate left renal collecting system calculi.  Faint gallstone. Lower pole left renal cyst. Abdominal aortic atherosclerosis. SKELETON: No abnormal marrow activity. Incidental CT findings:  Bilateral hip osteoarthritis. IMPRESSION: 1. Hypermetabolic left hepatic lobe metastasis. 2. Right hemicolectomy with hypermetabolic, enlarging metastatic implant adjacent the anastomosis. 3. Incidental findings, including: Coronary artery atherosclerosis. Aortic Atherosclerosis (ICD10-I70.0). Left nephrolithiasis. Cholelithiasis. Electronically Signed   By: Abigail Miyamoto M.D.   On: 03/20/2020 15:17    ASSESSMENT & PLAN:   Cancer of right colon ([place]) #Right-sided colon adenocarcinoma-with synchronous metastasis to liver/unresectable.  Currently on FOLFOX plus Avastin; PET June 2nd; over all STABLE; ~71m nodule/slight progression near anastamotic line; CEA-s Stable- 20-25.  Discussed with Dr. CChriss Driverliver lesions; recommend surveillance of the anastomotic lesion.  #Proceed with today- cycle 16 of FOLFOX plus Avastin chemotherapy.  Labs today reviewed;  acceptable for treatment today-except for mild thrombocytopenia platelets 93.   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  STABLE>   # HTN- systolic 1225Ostable. Recommend monitoring at home; STABLE>  # DISPOSITION: #FOLFOX+ Avastin today # follow up in 2 weeks- MD; labs- cbc/cmp/UA/ CEA; FOLFOX + avastin;pump off in 2 days; -Dr.B  All questions were answered. The patient knows to call the clinic with any problems, questions or concerns.    GCammie Sickle MD 04/08/2020 10:05 AM

## 2020-04-09 LAB — CEA: CEA: 26.4 ng/mL — ABNORMAL HIGH (ref 0.0–4.7)

## 2020-04-10 ENCOUNTER — Other Ambulatory Visit: Payer: Self-pay

## 2020-04-10 ENCOUNTER — Inpatient Hospital Stay: Payer: Medicare HMO

## 2020-04-10 DIAGNOSIS — C787 Secondary malignant neoplasm of liver and intrahepatic bile duct: Secondary | ICD-10-CM | POA: Diagnosis not present

## 2020-04-10 DIAGNOSIS — Z5111 Encounter for antineoplastic chemotherapy: Secondary | ICD-10-CM | POA: Diagnosis not present

## 2020-04-10 DIAGNOSIS — Z5112 Encounter for antineoplastic immunotherapy: Secondary | ICD-10-CM | POA: Diagnosis not present

## 2020-04-10 DIAGNOSIS — C182 Malignant neoplasm of ascending colon: Secondary | ICD-10-CM

## 2020-04-10 DIAGNOSIS — Z7189 Other specified counseling: Secondary | ICD-10-CM

## 2020-04-10 MED ORDER — HEPARIN SOD (PORK) LOCK FLUSH 100 UNIT/ML IV SOLN
500.0000 [IU] | Freq: Once | INTRAVENOUS | Status: AC | PRN
  Administered 2020-04-10: 500 [IU]
  Filled 2020-04-10: qty 5

## 2020-04-10 MED ORDER — SODIUM CHLORIDE 0.9% FLUSH
10.0000 mL | INTRAVENOUS | Status: DC | PRN
  Administered 2020-04-10: 10 mL
  Filled 2020-04-10: qty 10

## 2020-04-22 DIAGNOSIS — C182 Malignant neoplasm of ascending colon: Secondary | ICD-10-CM | POA: Diagnosis not present

## 2020-04-24 ENCOUNTER — Encounter: Payer: Self-pay | Admitting: Internal Medicine

## 2020-04-24 ENCOUNTER — Inpatient Hospital Stay: Payer: Medicare HMO | Attending: Internal Medicine

## 2020-04-24 ENCOUNTER — Inpatient Hospital Stay: Payer: Medicare HMO | Admitting: Internal Medicine

## 2020-04-24 ENCOUNTER — Inpatient Hospital Stay: Payer: Medicare HMO

## 2020-04-24 ENCOUNTER — Other Ambulatory Visit: Payer: Self-pay

## 2020-04-24 VITALS — BP 172/94 | HR 69 | Resp 16

## 2020-04-24 DIAGNOSIS — Z95828 Presence of other vascular implants and grafts: Secondary | ICD-10-CM

## 2020-04-24 DIAGNOSIS — Z7189 Other specified counseling: Secondary | ICD-10-CM

## 2020-04-24 DIAGNOSIS — C182 Malignant neoplasm of ascending colon: Secondary | ICD-10-CM | POA: Insufficient documentation

## 2020-04-24 DIAGNOSIS — Z5111 Encounter for antineoplastic chemotherapy: Secondary | ICD-10-CM | POA: Diagnosis not present

## 2020-04-24 DIAGNOSIS — C787 Secondary malignant neoplasm of liver and intrahepatic bile duct: Secondary | ICD-10-CM | POA: Insufficient documentation

## 2020-04-24 DIAGNOSIS — Z5112 Encounter for antineoplastic immunotherapy: Secondary | ICD-10-CM | POA: Insufficient documentation

## 2020-04-24 LAB — COMPREHENSIVE METABOLIC PANEL
ALT: 21 U/L (ref 0–44)
AST: 29 U/L (ref 15–41)
Albumin: 3.3 g/dL — ABNORMAL LOW (ref 3.5–5.0)
Alkaline Phosphatase: 110 U/L (ref 38–126)
Anion gap: 8 (ref 5–15)
BUN: 11 mg/dL (ref 8–23)
CO2: 25 mmol/L (ref 22–32)
Calcium: 8.3 mg/dL — ABNORMAL LOW (ref 8.9–10.3)
Chloride: 107 mmol/L (ref 98–111)
Creatinine, Ser: 0.75 mg/dL (ref 0.61–1.24)
GFR calc Af Amer: >60 mL/min (ref >60)
GFR calc non Af Amer: >60 mL/min (ref >60)
Glucose, Bld: 188 mg/dL — ABNORMAL HIGH (ref 70–99)
Potassium: 4.1 mmol/L (ref 3.5–5.1)
Sodium: 140 mmol/L (ref 135–145)
Total Bilirubin: 0.7 mg/dL (ref 0.3–1.2)
Total Protein: 6.8 g/dL (ref 6.5–8.1)

## 2020-04-24 LAB — URINALYSIS, COMPLETE (UACMP) WITH MICROSCOPIC
Bilirubin Urine: NEGATIVE
Glucose, UA: NEGATIVE mg/dL
Hgb urine dipstick: NEGATIVE
Ketones, ur: NEGATIVE mg/dL
Leukocytes,Ua: NEGATIVE
Nitrite: NEGATIVE
Protein, ur: NEGATIVE mg/dL
Specific Gravity, Urine: 1.011 (ref 1.005–1.030)
pH: 7 (ref 5.0–8.0)

## 2020-04-24 LAB — CBC WITH DIFFERENTIAL/PLATELET
Abs Immature Granulocytes: 0.01 10*3/uL (ref 0.00–0.07)
Basophils Absolute: 0 10*3/uL (ref 0.0–0.1)
Basophils Relative: 1 %
Eosinophils Absolute: 0.3 10*3/uL (ref 0.0–0.5)
Eosinophils Relative: 9 %
HCT: 37.2 % — ABNORMAL LOW (ref 39.0–52.0)
Hemoglobin: 12.9 g/dL — ABNORMAL LOW (ref 13.0–17.0)
Immature Granulocytes: 0 %
Lymphocytes Relative: 32 %
Lymphs Abs: 1.1 10*3/uL (ref 0.7–4.0)
MCH: 34.4 pg — ABNORMAL HIGH (ref 26.0–34.0)
MCHC: 34.7 g/dL (ref 30.0–36.0)
MCV: 99.2 fL (ref 80.0–100.0)
Monocytes Absolute: 0.5 10*3/uL (ref 0.1–1.0)
Monocytes Relative: 13 %
Neutro Abs: 1.5 10*3/uL — ABNORMAL LOW (ref 1.7–7.7)
Neutrophils Relative %: 45 %
Platelets: 105 10*3/uL — ABNORMAL LOW (ref 150–400)
RBC: 3.75 MIL/uL — ABNORMAL LOW (ref 4.22–5.81)
RDW: 14.7 % (ref 11.5–15.5)
WBC: 3.4 10*3/uL — ABNORMAL LOW (ref 4.0–10.5)
nRBC: 0 % (ref 0.0–0.2)

## 2020-04-24 MED ORDER — OXALIPLATIN CHEMO INJECTION 100 MG/20ML
178.0000 mg | Freq: Once | INTRAVENOUS | Status: AC
  Administered 2020-04-24: 180 mg via INTRAVENOUS
  Filled 2020-04-24: qty 36

## 2020-04-24 MED ORDER — SODIUM CHLORIDE 0.9 % IV SOLN
5.0000 mg/kg | Freq: Once | INTRAVENOUS | Status: AC
  Administered 2020-04-24: 400 mg via INTRAVENOUS
  Filled 2020-04-24: qty 16

## 2020-04-24 MED ORDER — SODIUM CHLORIDE 0.9 % IV SOLN
Freq: Once | INTRAVENOUS | Status: AC
  Filled 2020-04-24: qty 250

## 2020-04-24 MED ORDER — LEUCOVORIN CALCIUM INJECTION 350 MG
379.0000 mg/m2 | Freq: Once | INTRAVENOUS | Status: AC
  Administered 2020-04-24: 800 mg via INTRAVENOUS
  Filled 2020-04-24: qty 25

## 2020-04-24 MED ORDER — SODIUM CHLORIDE 0.9 % IV SOLN
2369.0000 mg/m2 | INTRAVENOUS | Status: DC
  Administered 2020-04-24: 5000 mg via INTRAVENOUS
  Filled 2020-04-24: qty 100

## 2020-04-24 MED ORDER — SODIUM CHLORIDE 0.9% FLUSH
10.0000 mL | INTRAVENOUS | Status: DC | PRN
  Administered 2020-04-24: 10 mL via INTRAVENOUS
  Filled 2020-04-24: qty 10

## 2020-04-24 MED ORDER — DEXTROSE 5 % IV SOLN
Freq: Once | INTRAVENOUS | Status: AC
  Filled 2020-04-24: qty 250

## 2020-04-24 MED ORDER — PALONOSETRON HCL INJECTION 0.25 MG/5ML
0.2500 mg | Freq: Once | INTRAVENOUS | Status: AC
  Administered 2020-04-24: 0.25 mg via INTRAVENOUS
  Filled 2020-04-24: qty 5

## 2020-04-24 MED ORDER — DEXAMETHASONE SODIUM PHOSPHATE 10 MG/ML IJ SOLN
10.0000 mg | Freq: Once | INTRAMUSCULAR | Status: AC
  Administered 2020-04-24: 10 mg via INTRAVENOUS
  Filled 2020-04-24: qty 1

## 2020-04-24 NOTE — Assessment & Plan Note (Addendum)
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  Currently on FOLFOX plus Avastin; PET June 2nd; over all STABLE; ~52mm nodule/slight progression near anastamotic line; CEA-s slightly rising- 26.    #Proceed with today- cycle 17 of FOLFOX plus Avastin chemotherapy.  Labs today reviewed;  acceptable for treatment today-except for mild thrombocytopenia platelets 105.  Discussed that median PFS on first-line therapy is approximately 10 months.  And we might have to switch treatments if progression is noted clinically/imaging.  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  STABLE.    # HTN- systolic 903O stable. Recommend monitoring at home; STABLE>   # DISPOSITION: #FOLFOX+ Avastin today # follow up in 2 weeks- MD; labs- cbc/cmp/UA/ CEA; FOLFOX + avastin;pump off in 2 days; -Dr.B

## 2020-04-24 NOTE — Progress Notes (Signed)
Final BP 174/92 HR 69. Dr. Rogue Bussing made aware.Okay to discharge home per Dr. Rogue Bussing. Educated patient on s/s as to when to seek emergency care. Patient verbalizes understanding and denies any further questions or concerns.

## 2020-04-24 NOTE — Progress Notes (Signed)
Pt in for follow up denies any concerns today.  

## 2020-04-24 NOTE — Progress Notes (Signed)
[facility] NOTE  Patient Care Team: Sofie Hartigan, MD as PCP - General (Family Medicine) [name], MD as Consulting Physician (Hematology and Oncology)  CHIEF COMPLAINTS/PURPOSE OF CONSULTATION: Colon cancer  #  Oncology History Overview Note  # MAY 2020- 3. 03/09/19 Liver biopsy. Microscopic examination shows malignant cells with glandular architecture consistent with adenocarcinoma. The malignant cells are positive for CK20 and CDX-2. These findings support the clinical impression of metastatic colon adenocarcinoma. 4. 03/10/19 R hemicolectomy. Tumor site cecum. Adenocarcinoma. Mucinous features present. G2. No tumor deposits. Invades visceral peritoneum. No tumor perforation. LVI present. PNI not identified. All margins uninvolved. 1/12 LNs. PT4apN1. Periappendiceal inflammation c/w resolving abscess. Microsatellite stable (MSS). [Dr.Mettu; [facility]]  # SEP 4th 2020 [compared to May 2020]  Interval increase in size of the metastases to the hepatic dome, The metastasis to the left hepatic lobe is unchanged; 2.  New subcentimeter hypoattenuating lesion in the inferior right hepatic lobe, incompletely characterized on CT. 3.  Postsurgical changes following right hemicolectomy.  # OCT 2020- FOLFOX +avastin; CT dec 22nd 2020- [compared to [facility] sep 9th 2020]-Liver- slight progression versus stable disease.  # NGS/F-ONE-MUTATED K-RAS [G]  # PALLIATIVE CARE EVALUATION: 09/20/2019-Oscar [name]  # PAIN MANAGEMENT: NA   DIAGNOSIS: COLON CANCER  STAGE:  IV     ;  GOALS:Palliative  CURRENT/MOST RECENT THERAPY : FOLFOX+ avastin [C]    Cancer of right colon ([place])  07/05/2019 Initial Diagnosis   Cancer of right colon ([place])   07/24/2019 -  Chemotherapy   The patient had dexamethasone (DECADRON) 4 MG tablet, 8 mg, Oral, Daily, 1 of 1 cycle, Start date: --, End date: -- palonosetron (ALOXI) injection 0.25 mg, 0.25 mg, Intravenous,  Once, 19 of 23 cycles Administration: 0.25 mg  (07/24/2019), 0.25 mg (08/07/2019), 0.25 mg (08/21/2019), 0.25 mg (09/04/2019), 0.25 mg (09/20/2019), 0.25 mg (10/04/2019), 0.25 mg (10/16/2019), 0.25 mg (10/30/2019), 0.25 mg (11/22/2019), 0.25 mg (12/06/2019), 0.25 mg (12/20/2019), 0.25 mg (01/03/2020), 0.25 mg (01/29/2020), 0.25 mg (02/12/2020), 0.25 mg (02/26/2020), 0.25 mg (03/11/2020), 0.25 mg (03/25/2020), 0.25 mg (04/08/2020) leucovorin 800 mg in dextrose 5 % 250 mL infusion, 844 mg, Intravenous,  Once, 19 of 23 cycles Administration: 800 mg (07/24/2019), 800 mg (08/07/2019), 800 mg (08/21/2019), 800 mg (09/04/2019), 800 mg (09/20/2019), 800 mg (10/04/2019), 800 mg (10/16/2019), 800 mg (10/30/2019), 800 mg (11/22/2019), 800 mg (12/06/2019), 800 mg (12/20/2019), 800 mg (01/03/2020), 800 mg (01/29/2020), 800 mg (02/12/2020), 800 mg (02/26/2020), 800 mg (03/11/2020), 800 mg (03/25/2020), 800 mg (04/08/2020) oxaliplatin (ELOXATIN) 180 mg in dextrose 5 % 500 mL chemo infusion, 85 mg/m2 = 180 mg, Intravenous,  Once, 19 of 23 cycles Dose modification: 178 mg (original dose 85 mg/m2, Cycle 17, Reason: Other (see comments), Comment: insurance adjusted dose ) Administration: 180 mg (07/24/2019), 180 mg (08/07/2019), 180 mg (08/21/2019), 180 mg (09/04/2019), 180 mg (09/20/2019), 180 mg (10/04/2019), 180 mg (10/16/2019), 180 mg (10/30/2019), 180 mg (11/22/2019), 180 mg (12/06/2019), 180 mg (12/20/2019), 180 mg (01/03/2020), 180 mg (01/29/2020), 180 mg (02/12/2020), 180 mg (02/26/2020), 180 mg (03/11/2020), 180 mg (04/08/2020) fluorouracil (ADRUCIL) 5,000 mg in sodium chloride 0.9 % 150 mL chemo infusion, 5,050 mg, Intravenous, 1 Day/Dose, 19 of 23 cycles Administration: 5,000 mg (07/24/2019), 5,000 mg (08/07/2019), 5,000 mg (08/21/2019), 5,050 mg (09/04/2019), 5,000 mg (10/04/2019), 5,000 mg (10/16/2019), 5,000 mg (11/22/2019), 5,000 mg (12/06/2019), 5,000 mg (12/20/2019), 5,000 mg (01/03/2020), 5,000 mg (01/29/2020), 5,000 mg (02/12/2020), 5,000 mg (02/26/2020), 5,000 mg (03/11/2020), 5,000 mg (03/25/2020), 5,000 mg  (04/08/2020) bevacizumab-bvzr (ZIRABEV) 400  mg in sodium chloride 0.9 % 100 mL chemo infusion, 5 mg/kg = 400 mg, Intravenous,  Once, 19 of 23 cycles Administration: 400 mg (08/07/2019), 400 mg (08/21/2019), 400 mg (09/04/2019), 400 mg (09/20/2019), 400 mg (10/04/2019), 400 mg (10/16/2019), 400 mg (10/30/2019), 400 mg (11/22/2019), 400 mg (12/06/2019), 400 mg (12/20/2019), 400 mg (01/03/2020), 400 mg (01/29/2020), 400 mg (02/12/2020), 400 mg (02/26/2020), 400 mg (03/11/2020), 400 mg (03/25/2020), 400 mg (04/08/2020)  for chemotherapy treatment.      HISTORY OF PRESENTING ILLNESS:  Oscar [name] 79 y.o.  male with metastatic colon cancer to the liver currently on FOLFOX plus Avastin is here for follow-up.  Appetite is good.  No weight loss.  Continues to have mild tingling and numbness in his extremities.  No falls.  No tripping.  No abdominal pain.  No nausea no vomiting.  Review of Systems  Constitutional: Positive for malaise/fatigue. Negative for chills, diaphoresis, fever and weight loss.  HENT: Negative for nosebleeds and sore throat.   Eyes: Negative for double vision.  Respiratory: Negative for cough, hemoptysis, sputum production, shortness of breath and wheezing.   Cardiovascular: Negative for chest pain, palpitations, orthopnea and leg swelling.  Gastrointestinal: Negative for abdominal pain, blood in stool, constipation, diarrhea, heartburn, melena, nausea and vomiting.  Genitourinary: Negative for dysuria, frequency and urgency.  Musculoskeletal: Positive for back pain and joint pain.  Skin: Negative.  Negative for itching and rash.  Neurological: Positive for tingling. Negative for dizziness, focal weakness, weakness and headaches.  Endo/Heme/Allergies: Does not bruise/bleed easily.  Psychiatric/Behavioral: Negative for depression. The patient is not nervous/anxious and does not have insomnia.      MEDICAL HISTORY:  Past Medical History:  Diagnosis Date  . Arthritis   . Cancer ([place])     colon cancer 02/2019 per pt   . Diabetes mellitus without complication ([place])   . H/O colon cancer, stage IV   . Hyperlipemia   . Hypertension     SURGICAL HISTORY: Past Surgical History:  Procedure Laterality Date  . IR IMAGING GUIDED PORT INSERTION  07/20/2019  . JOINT REPLACEMENT      SOCIAL HISTORY: Social History   Socioeconomic History  . Marital status: Married    Spouse name: Not on file  . Number of children: Not on file  . Years of education: Not on file  . Highest education level: Not on file  Occupational History  . Not on file  Tobacco Use  . Smoking status: Current Every Day Smoker    Packs/day: 0.25    Types: Cigarettes  . Smokeless tobacco: Never Used  . Tobacco comment: 1 to 2 cigarettes a day occasionally  Vaping Use  . Vaping Use: Never used  Substance and Sexual Activity  . Alcohol use: No  . Drug use: No  . Sexual activity: Not on file  Other Topics Concern  . Not on file  Social History Narrative   Recruitment consultant retd; lives in [place]; smoking 3cig/day; [3/4 ppd x started at 9 years]; no alcohol. Son & daughter; wife dementia [waiting for placement].    Social Determinants of Health   Financial Resource Strain:   . Difficulty of Paying Living Expenses:   Food Insecurity:   . Worried About Charity fundraiser in the Last Year:   . Arboriculturist in the Last Year:   Transportation Needs:   . Film/video editor (Medical):   Marland Kitchen Lack of Transportation (Non-Medical):   Physical Activity:   . Days  of Exercise per Week:   . Minutes of Exercise per Session:   Stress:   . Feeling of Stress :   Social Connections:   . Frequency of Communication with Friends and Family:   . Frequency of Social Gatherings with Friends and Family:   . Attends Religious Services:   . Active Member of Clubs or Organizations:   . Attends Archivist Meetings:   Marland Kitchen Marital Status:   Intimate Partner Violence:   . Fear of Current or  Ex-Partner:   . Emotionally Abused:   Marland Kitchen Physically Abused:   . Sexually Abused:     FAMILY HISTORY: Family History  Problem Relation Age of Onset  . Peptic Ulcer Disease Father     ALLERGIES:  is allergic to ace inhibitors.  MEDICATIONS:  Current Outpatient Medications  Medication Sig Dispense Refill  . amLODipine (NORVASC) 5 MG tablet Take 5 mg by mouth daily.    . fluticasone (FLONASE) 50 MCG/ACT nasal spray Place 1 spray into both nostrils daily as needed.     . meloxicam (MOBIC) 15 MG tablet Take 1 tablet (15 mg total) by mouth daily. 90 tablet 0  . pravastatin (PRAVACHOL) 40 MG tablet Take 40 mg by mouth daily.    . tamsulosin (FLOMAX) 0.4 MG CAPS capsule Take 1 capsule (0.4 mg total) by mouth daily. 30 capsule 11  . lidocaine-prilocaine (EMLA) cream Apply 1 application topically as needed. (Patient not taking: Reported on 12/05/2019) 30 g 0  . montelukast (SINGULAIR) 10 MG tablet TAKE 1 TABLET BY MOUTH EVERYDAY AT BEDTIME (Patient not taking: Reported on 03/25/2020) 90 tablet 1  . ondansetron (ZOFRAN-ODT) 4 MG disintegrating tablet Take 1 tablet (4 mg total) by mouth every 6 (six) hours as needed for nausea. (Patient not taking: Reported on 12/05/2019) 20 tablet 0  . prochlorperazine (COMPAZINE) 10 MG tablet Take 1 tablet (10 mg total) by mouth every 6 (six) hours as needed for nausea or vomiting. (Patient not taking: Reported on 12/05/2019) 40 tablet 1   No current facility-administered medications for this visit.   Facility-Administered Medications Ordered in Other Visits  Medication Dose Route Frequency Provider Last Rate Last Admin  . fluorouracil (ADRUCIL) 5,000 mg in sodium chloride 0.9 % 150 mL chemo infusion  2,369 mg/m2 (Treatment Plan Recorded) Intravenous 1 day or 1 dose Charlaine Dalton R, MD      . leucovorin 800 mg in dextrose 5 % 250 mL infusion  379 mg/m2 (Treatment Plan Recorded) Intravenous Once Cammie Sickle, MD 145 mL/hr at 04/24/20 1134 800 mg at  04/24/20 1134  . oxaliplatin (ELOXATIN) 180 mg in dextrose 5 % 500 mL chemo infusion  180 mg Intravenous Once Cammie Sickle, MD 268 mL/hr at 04/24/20 1135 180 mg at 04/24/20 1135      .  PHYSICAL EXAMINATION: ECOG PERFORMANCE STATUS: 0 - Asymptomatic  Vitals:   04/24/20 0954 04/24/20 1018  BP: (!) 109/96 (!) 145/80  Pulse: 71   Resp: 18   Temp: 97.9 F (36.6 C)   SpO2: 100%    Filed Weights   04/24/20 0954  Weight: 194 lb 6.4 oz (88.2 kg)    Physical Exam HENT:     Head: Normocephalic and atraumatic.     Mouth/Throat:     Pharynx: No oropharyngeal exudate.  Eyes:     Pupils: Pupils are equal, round, and reactive to light.  Cardiovascular:     Rate and Rhythm: Normal rate and regular rhythm.  Pulmonary:  Effort: No respiratory distress.     Breath sounds: No wheezing.  Abdominal:     General: Bowel sounds are normal. There is no distension.     Palpations: Abdomen is soft. There is no mass.     Tenderness: There is no abdominal tenderness. There is no guarding or rebound.  Musculoskeletal:        General: No tenderness. Normal range of motion.     Cervical back: Normal range of motion and neck supple.  Skin:    General: Skin is warm.  Neurological:     Mental Status: He is alert and oriented to person, place, and time.  Psychiatric:        Mood and Affect: Affect normal.    LABORATORY DATA:  I have reviewed the data as listed Lab Results  Component Value Date   WBC 3.4 (L) 04/24/2020   HGB 12.9 (L) 04/24/2020   HCT 37.2 (L) 04/24/2020   MCV 99.2 04/24/2020   PLT 105 (L) 04/24/2020   Recent Labs    03/25/20 0851 04/08/20 0826 04/24/20 0918  NA 141 139 140  K 3.8 3.8 4.1  CL 108 107 107  CO2 _0 GLUCOSE 162* 185* 188*  BUN _1 CREATININE 0.75 0.85 0.75  CALCIUM 8.4* 8.3* 8.3*  GFRNONAA >60 >60 >60  GFRAA >60 >60 >60  PROT 6.7 6.7 6.8  ALBUMIN 3.3* 3.3* 3.3*  AST 28 36 29  ALT _2 ALKPHOS 110 115 110  BILITOT 0.8  0.7 0.7    RADIOGRAPHIC STUDIES: I have personally reviewed the radiological images as listed and agreed with the findings in the report. No results found.  ASSESSMENT & PLAN:   Cancer of right colon ([place]) #Right-sided colon adenocarcinoma-with synchronous metastasis to liver/unresectable.  Currently on FOLFOX plus Avastin; PET June 2nd; over all STABLE; ~28m nodule/slight progression near anastamotic line; CEA-s slightly rising- 26.    #Proceed with today- cycle 17 of FOLFOX plus Avastin chemotherapy.  Labs today reviewed;  acceptable for treatment today-except for mild thrombocytopenia platelets 105.  Discussed that median PFS on first-line therapy is approximately 10 months.  And we might have to switch treatments if progression is noted clinically/imaging.  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  STABLE.    # HTN- systolic 1132Gstable. Recommend monitoring at home; STABLE>   # DISPOSITION: #FOLFOX+ Avastin today # follow up in 2 weeks- MD; labs- cbc/cmp/UA/ CEA; FOLFOX + avastin;pump off in 2 days; -Dr.B  All questions were answered. The patient knows to call the clinic with any problems, questions or concerns.    GCammie Sickle MD 04/24/2020 12:08 PM

## 2020-04-25 LAB — CEA: CEA: 25.8 ng/mL — ABNORMAL HIGH (ref 0.0–4.7)

## 2020-04-26 ENCOUNTER — Other Ambulatory Visit: Payer: Self-pay

## 2020-04-26 ENCOUNTER — Inpatient Hospital Stay: Payer: Medicare HMO

## 2020-04-26 VITALS — BP 162/88 | HR 68 | Temp 98.5°F | Resp 18

## 2020-04-26 DIAGNOSIS — C787 Secondary malignant neoplasm of liver and intrahepatic bile duct: Secondary | ICD-10-CM | POA: Diagnosis not present

## 2020-04-26 DIAGNOSIS — Z7189 Other specified counseling: Secondary | ICD-10-CM

## 2020-04-26 DIAGNOSIS — Z5112 Encounter for antineoplastic immunotherapy: Secondary | ICD-10-CM | POA: Diagnosis not present

## 2020-04-26 DIAGNOSIS — C182 Malignant neoplasm of ascending colon: Secondary | ICD-10-CM

## 2020-04-26 DIAGNOSIS — Z5111 Encounter for antineoplastic chemotherapy: Secondary | ICD-10-CM | POA: Diagnosis not present

## 2020-04-26 MED ORDER — SODIUM CHLORIDE 0.9% FLUSH
10.0000 mL | INTRAVENOUS | Status: DC | PRN
  Administered 2020-04-26: 10 mL
  Filled 2020-04-26: qty 10

## 2020-04-26 MED ORDER — HEPARIN SOD (PORK) LOCK FLUSH 100 UNIT/ML IV SOLN
INTRAVENOUS | Status: AC
  Filled 2020-04-26: qty 5

## 2020-04-26 MED ORDER — HEPARIN SOD (PORK) LOCK FLUSH 100 UNIT/ML IV SOLN
500.0000 [IU] | Freq: Once | INTRAVENOUS | Status: AC | PRN
  Administered 2020-04-26: 500 [IU]
  Filled 2020-04-26: qty 5

## 2020-04-30 ENCOUNTER — Other Ambulatory Visit: Payer: Self-pay

## 2020-04-30 ENCOUNTER — Inpatient Hospital Stay (HOSPITAL_BASED_OUTPATIENT_CLINIC_OR_DEPARTMENT_OTHER): Payer: Medicare HMO | Admitting: Hospice and Palliative Medicine

## 2020-04-30 DIAGNOSIS — C182 Malignant neoplasm of ascending colon: Secondary | ICD-10-CM

## 2020-04-30 NOTE — Progress Notes (Signed)
I was unable to reach patient by phone.  Voicemail left.  Will reschedule. 

## 2020-05-08 ENCOUNTER — Inpatient Hospital Stay: Payer: Medicare HMO

## 2020-05-08 ENCOUNTER — Other Ambulatory Visit: Payer: Self-pay

## 2020-05-08 ENCOUNTER — Inpatient Hospital Stay: Payer: Medicare HMO | Admitting: Internal Medicine

## 2020-05-08 DIAGNOSIS — Z95828 Presence of other vascular implants and grafts: Secondary | ICD-10-CM

## 2020-05-08 DIAGNOSIS — C182 Malignant neoplasm of ascending colon: Secondary | ICD-10-CM

## 2020-05-08 DIAGNOSIS — C787 Secondary malignant neoplasm of liver and intrahepatic bile duct: Secondary | ICD-10-CM | POA: Diagnosis not present

## 2020-05-08 DIAGNOSIS — Z7189 Other specified counseling: Secondary | ICD-10-CM

## 2020-05-08 DIAGNOSIS — Z5112 Encounter for antineoplastic immunotherapy: Secondary | ICD-10-CM | POA: Diagnosis not present

## 2020-05-08 DIAGNOSIS — Z5111 Encounter for antineoplastic chemotherapy: Secondary | ICD-10-CM | POA: Diagnosis not present

## 2020-05-08 LAB — COMPREHENSIVE METABOLIC PANEL
ALT: 20 U/L (ref 0–44)
AST: 33 U/L (ref 15–41)
Albumin: 3.2 g/dL — ABNORMAL LOW (ref 3.5–5.0)
Alkaline Phosphatase: 106 U/L (ref 38–126)
Anion gap: 6 (ref 5–15)
BUN: 11 mg/dL (ref 8–23)
CO2: 26 mmol/L (ref 22–32)
Calcium: 8.2 mg/dL — ABNORMAL LOW (ref 8.9–10.3)
Chloride: 108 mmol/L (ref 98–111)
Creatinine, Ser: 0.81 mg/dL (ref 0.61–1.24)
GFR calc Af Amer: >60 mL/min (ref >60)
GFR calc non Af Amer: >60 mL/min (ref >60)
Glucose, Bld: 183 mg/dL — ABNORMAL HIGH (ref 70–99)
Potassium: 3.8 mmol/L (ref 3.5–5.1)
Sodium: 140 mmol/L (ref 135–145)
Total Bilirubin: 0.7 mg/dL (ref 0.3–1.2)
Total Protein: 6.4 g/dL — ABNORMAL LOW (ref 6.5–8.1)

## 2020-05-08 LAB — CBC WITH DIFFERENTIAL/PLATELET
Abs Immature Granulocytes: 0 10*3/uL (ref 0.00–0.07)
Basophils Absolute: 0 10*3/uL (ref 0.0–0.1)
Basophils Relative: 1 %
Eosinophils Absolute: 0.3 10*3/uL (ref 0.0–0.5)
Eosinophils Relative: 9 %
HCT: 36.3 % — ABNORMAL LOW (ref 39.0–52.0)
Hemoglobin: 12.8 g/dL — ABNORMAL LOW (ref 13.0–17.0)
Immature Granulocytes: 0 %
Lymphocytes Relative: 30 %
Lymphs Abs: 1.1 10*3/uL (ref 0.7–4.0)
MCH: 34.6 pg — ABNORMAL HIGH (ref 26.0–34.0)
MCHC: 35.3 g/dL (ref 30.0–36.0)
MCV: 98.1 fL (ref 80.0–100.0)
Monocytes Absolute: 0.5 10*3/uL (ref 0.1–1.0)
Monocytes Relative: 14 %
Neutro Abs: 1.6 10*3/uL — ABNORMAL LOW (ref 1.7–7.7)
Neutrophils Relative %: 46 %
Platelets: 99 10*3/uL — ABNORMAL LOW (ref 150–400)
RBC: 3.7 MIL/uL — ABNORMAL LOW (ref 4.22–5.81)
RDW: 14.9 % (ref 11.5–15.5)
WBC: 3.6 10*3/uL — ABNORMAL LOW (ref 4.0–10.5)
nRBC: 0 % (ref 0.0–0.2)

## 2020-05-08 LAB — URINALYSIS, COMPLETE (UACMP) WITH MICROSCOPIC
Bacteria, UA: NONE SEEN
Glucose, UA: 50 mg/dL — AB
Hgb urine dipstick: NEGATIVE
Ketones, ur: 5 mg/dL — AB
Leukocytes,Ua: NEGATIVE
Nitrite: NEGATIVE
Protein, ur: 30 mg/dL — AB
Specific Gravity, Urine: 1.032 — ABNORMAL HIGH (ref 1.005–1.030)
pH: 5 (ref 5.0–8.0)

## 2020-05-08 MED ORDER — DEXTROSE 5 % IV SOLN
Freq: Once | INTRAVENOUS | Status: AC
  Filled 2020-05-08: qty 250

## 2020-05-08 MED ORDER — SODIUM CHLORIDE 0.9% FLUSH
10.0000 mL | INTRAVENOUS | Status: DC | PRN
  Administered 2020-05-08: 10 mL via INTRAVENOUS
  Filled 2020-05-08: qty 10

## 2020-05-08 MED ORDER — DEXAMETHASONE SODIUM PHOSPHATE 10 MG/ML IJ SOLN
10.0000 mg | Freq: Once | INTRAMUSCULAR | Status: AC
  Administered 2020-05-08: 10 mg via INTRAVENOUS
  Filled 2020-05-08: qty 1

## 2020-05-08 MED ORDER — SODIUM CHLORIDE 0.9 % IV SOLN
5000.0000 mg | INTRAVENOUS | Status: DC
  Administered 2020-05-08: 5000 mg via INTRAVENOUS
  Filled 2020-05-08: qty 100

## 2020-05-08 MED ORDER — LEUCOVORIN CALCIUM INJECTION 350 MG
800.0000 mg | Freq: Once | INTRAVENOUS | Status: AC
  Administered 2020-05-08: 800 mg via INTRAVENOUS
  Filled 2020-05-08: qty 25

## 2020-05-08 MED ORDER — SODIUM CHLORIDE 0.9 % IV SOLN
INTRAVENOUS | Status: DC
  Filled 2020-05-08: qty 250

## 2020-05-08 MED ORDER — PALONOSETRON HCL INJECTION 0.25 MG/5ML
0.2500 mg | Freq: Once | INTRAVENOUS | Status: AC
  Administered 2020-05-08: 0.25 mg via INTRAVENOUS
  Filled 2020-05-08: qty 5

## 2020-05-08 MED ORDER — OXALIPLATIN CHEMO INJECTION 100 MG/20ML
178.0000 mg | Freq: Once | INTRAVENOUS | Status: AC
  Administered 2020-05-08: 180 mg via INTRAVENOUS
  Filled 2020-05-08: qty 36

## 2020-05-08 MED ORDER — SODIUM CHLORIDE 0.9 % IV SOLN
5.0000 mg/kg | Freq: Once | INTRAVENOUS | Status: AC
  Administered 2020-05-08: 400 mg via INTRAVENOUS
  Filled 2020-05-08: qty 16

## 2020-05-08 NOTE — Progress Notes (Signed)
[place] NOTE  Patient Care Team: Sofie Hartigan, MD as PCP - General (Family Medicine) [name], MD as Consulting Physician (Hematology and Oncology)  CHIEF COMPLAINTS/PURPOSE OF CONSULTATION: Colon cancer  #  Oncology History Overview Note  # MAY 2020- 3. 03/09/19 Liver biopsy. Microscopic examination shows malignant cells with glandular architecture consistent with adenocarcinoma. The malignant cells are positive for CK20 and CDX-2. These findings support the clinical impression of metastatic colon adenocarcinoma. 4. 03/10/19 R hemicolectomy. Tumor site cecum. Adenocarcinoma. Mucinous features present. G2. No tumor deposits. Invades visceral peritoneum. No tumor perforation. LVI present. PNI not identified. All margins uninvolved. 1/12 LNs. PT4apN1. Periappendiceal inflammation c/w resolving abscess. Microsatellite stable (MSS). [Dr.Mettu; [facility]]  # SEP 4th 2020 [compared to May 2020]  Interval increase in size of the metastases to the hepatic dome, The metastasis to the left hepatic lobe is unchanged; 2.  New subcentimeter hypoattenuating lesion in the inferior right hepatic lobe, incompletely characterized on CT. 3.  Postsurgical changes following right hemicolectomy.  # OCT 2020- FOLFOX +avastin; CT dec 22nd 2020- [compared to [facility] sep 9th 2020]-Liver- slight progression versus stable disease.  # NGS/F-ONE-MUTATED K-RAS [G]  # PALLIATIVE CARE EVALUATION: 09/20/2019-Oscar [name]  # PAIN MANAGEMENT: NA   DIAGNOSIS: COLON CANCER  STAGE:  IV     ;  GOALS:Palliative  CURRENT/MOST RECENT THERAPY : FOLFOX+ avastin [C]    Cancer of right colon ([place])  07/05/2019 Initial Diagnosis   Cancer of right colon ([place])   07/24/2019 -  Chemotherapy   The patient had dexamethasone (DECADRON) 4 MG tablet, 8 mg, Oral, Daily, 1 of 1 cycle, Start date: --, End date: -- palonosetron (ALOXI) injection 0.25 mg, 0.25 mg, Intravenous,  Once, 20 of 23 cycles Administration: 0.25 mg  (07/24/2019), 0.25 mg (08/07/2019), 0.25 mg (08/21/2019), 0.25 mg (09/04/2019), 0.25 mg (09/20/2019), 0.25 mg (10/04/2019), 0.25 mg (10/16/2019), 0.25 mg (10/30/2019), 0.25 mg (11/22/2019), 0.25 mg (12/06/2019), 0.25 mg (12/20/2019), 0.25 mg (01/03/2020), 0.25 mg (01/29/2020), 0.25 mg (02/12/2020), 0.25 mg (02/26/2020), 0.25 mg (03/11/2020), 0.25 mg (03/25/2020), 0.25 mg (04/08/2020), 0.25 mg (04/24/2020), 0.25 mg (05/08/2020) leucovorin 800 mg in dextrose 5 % 250 mL infusion, 844 mg, Intravenous,  Once, 20 of 23 cycles Administration: 800 mg (07/24/2019), 800 mg (08/07/2019), 800 mg (08/21/2019), 800 mg (09/04/2019), 800 mg (09/20/2019), 800 mg (10/04/2019), 800 mg (10/16/2019), 800 mg (10/30/2019), 800 mg (11/22/2019), 800 mg (12/06/2019), 800 mg (12/20/2019), 800 mg (01/03/2020), 800 mg (01/29/2020), 800 mg (02/12/2020), 800 mg (02/26/2020), 800 mg (03/11/2020), 800 mg (03/25/2020), 800 mg (04/08/2020), 800 mg (04/24/2020), 800 mg (05/08/2020) oxaliplatin (ELOXATIN) 180 mg in dextrose 5 % 500 mL chemo infusion, 85 mg/m2 = 180 mg, Intravenous,  Once, 20 of 23 cycles Dose modification: 178 mg (original dose 85 mg/m2, Cycle 17, Reason: Other (see comments), Comment: insurance adjusted dose ) Administration: 180 mg (07/24/2019), 180 mg (08/07/2019), 180 mg (08/21/2019), 180 mg (09/04/2019), 180 mg (09/20/2019), 180 mg (10/04/2019), 180 mg (10/16/2019), 180 mg (10/30/2019), 180 mg (11/22/2019), 180 mg (12/06/2019), 180 mg (12/20/2019), 180 mg (01/03/2020), 180 mg (01/29/2020), 180 mg (02/12/2020), 180 mg (02/26/2020), 180 mg (03/11/2020), 180 mg (04/08/2020), 180 mg (04/24/2020), 180 mg (05/08/2020) fluorouracil (ADRUCIL) 5,000 mg in sodium chloride 0.9 % 150 mL chemo infusion, 5,050 mg, Intravenous, 1 Day/Dose, 20 of 23 cycles Administration: 5,000 mg (07/24/2019), 5,000 mg (08/07/2019), 5,000 mg (08/21/2019), 5,050 mg (09/04/2019), 5,000 mg (10/04/2019), 5,000 mg (10/16/2019), 5,000 mg (11/22/2019), 5,000 mg (12/06/2019), 5,000 mg (12/20/2019), 5,000 mg (01/03/2020), 5,000  mg (01/29/2020),  5,000 mg (02/12/2020), 5,000 mg (02/26/2020), 5,000 mg (03/11/2020), 5,000 mg (03/25/2020), 5,000 mg (04/08/2020), 5,000 mg (04/24/2020), 5,000 mg (05/08/2020) bevacizumab-bvzr (ZIRABEV) 400 mg in sodium chloride 0.9 % 100 mL chemo infusion, 5 mg/kg = 400 mg, Intravenous,  Once, 20 of 23 cycles Administration: 400 mg (08/07/2019), 400 mg (08/21/2019), 400 mg (09/04/2019), 400 mg (09/20/2019), 400 mg (10/04/2019), 400 mg (10/16/2019), 400 mg (10/30/2019), 400 mg (11/22/2019), 400 mg (12/06/2019), 400 mg (12/20/2019), 400 mg (01/03/2020), 400 mg (01/29/2020), 400 mg (02/12/2020), 400 mg (02/26/2020), 400 mg (03/11/2020), 400 mg (03/25/2020), 400 mg (04/08/2020), 400 mg (04/24/2020), 400 mg (05/08/2020)  for chemotherapy treatment.      HISTORY OF PRESENTING ILLNESS:  Oscar [name] 79 y.o.  male with metastatic colon cancer to the liver currently on FOLFOX plus Avastin is here for follow-up.  Patient appetite is good.  No weight loss.  No nausea no vomiting.  No falls.  No tripping.  Mild chronic tingling numbness in the extremities.  No worsening abdominal pain. Review of Systems  Constitutional: Positive for malaise/fatigue. Negative for chills, diaphoresis, fever and weight loss.  HENT: Negative for nosebleeds and sore throat.   Eyes: Negative for double vision.  Respiratory: Negative for cough, hemoptysis, sputum production, shortness of breath and wheezing.   Cardiovascular: Negative for chest pain, palpitations, orthopnea and leg swelling.  Gastrointestinal: Negative for abdominal pain, blood in stool, constipation, diarrhea, heartburn, melena, nausea and vomiting.  Genitourinary: Negative for dysuria, frequency and urgency.  Musculoskeletal: Positive for back pain and joint pain.  Skin: Negative.  Negative for itching and rash.  Neurological: Positive for tingling. Negative for dizziness, focal weakness, weakness and headaches.  Endo/Heme/Allergies: Does not bruise/bleed easily.   Psychiatric/Behavioral: Negative for depression. The patient is not nervous/anxious and does not have insomnia.      MEDICAL HISTORY:  Past Medical History:  Diagnosis Date  . Arthritis   . Cancer (HCC)    colon cancer 02/2019 per pt   . Diabetes mellitus without complication (HCC)   . H/O colon cancer, stage IV   . Hyperlipemia   . Hypertension     SURGICAL HISTORY: Past Surgical History:  Procedure Laterality Date  . IR IMAGING GUIDED PORT INSERTION  07/20/2019  . JOINT REPLACEMENT      SOCIAL HISTORY: Social History   Socioeconomic History  . Marital status: Married    Spouse name: Not on file  . Number of children: Not on file  . Years of education: Not on file  . Highest education level: Not on file  Occupational History  . Not on file  Tobacco Use  . Smoking status: Current Every Day Smoker    Packs/day: 0.25    Types: Cigarettes  . Smokeless tobacco: Never Used  . Tobacco comment: 1 to 2 cigarettes a day occasionally  Vaping Use  . Vaping Use: Never used  Substance and Sexual Activity  . Alcohol use: No  . Drug use: No  . Sexual activity: Not on file  Other Topics Concern  . Not on file  Social History Narrative   Public affairs consultant retd; lives in [place]; smoking 3cig/day; [3/4 ppd x started at 9 years]; no alcohol. Son & daughter; wife dementia [waiting for placement].    Social Determinants of Health   Financial Resource Strain:   . Difficulty of Paying Living Expenses:   Food Insecurity:   . Worried About Programme researcher, broadcasting/film/video in the Last Year:   . [employer] of Food in the  Last Year:   Transportation Needs:   . Film/video editor (Medical):   Marland Kitchen Lack of Transportation (Non-Medical):   Physical Activity:   . Days of Exercise per Week:   . Minutes of Exercise per Session:   Stress:   . Feeling of Stress :   Social Connections:   . Frequency of Communication with Friends and Family:   . Frequency of Social Gatherings with Friends  and Family:   . Attends Religious Services:   . Active Member of Clubs or Organizations:   . Attends Archivist Meetings:   Marland Kitchen Marital Status:   Intimate Partner Violence:   . Fear of Current or Ex-Partner:   . Emotionally Abused:   Marland Kitchen Physically Abused:   . Sexually Abused:     FAMILY HISTORY: Family History  Problem Relation Age of Onset  . Peptic Ulcer Disease Father     ALLERGIES:  is allergic to ace inhibitors.  MEDICATIONS:  Current Outpatient Medications  Medication Sig Dispense Refill  . amLODipine (NORVASC) 5 MG tablet Take 5 mg by mouth daily.    . fluticasone (FLONASE) 50 MCG/ACT nasal spray Place 1 spray into both nostrils daily as needed.     . meloxicam (MOBIC) 15 MG tablet Take 1 tablet (15 mg total) by mouth daily. 90 tablet 0  . pravastatin (PRAVACHOL) 40 MG tablet Take 40 mg by mouth daily.    . tamsulosin (FLOMAX) 0.4 MG CAPS capsule Take 1 capsule (0.4 mg total) by mouth daily. 30 capsule 11  . lidocaine-prilocaine (EMLA) cream Apply 1 application topically as needed. (Patient not taking: Reported on 12/05/2019) 30 g 0  . montelukast (SINGULAIR) 10 MG tablet TAKE 1 TABLET BY MOUTH EVERYDAY AT BEDTIME (Patient not taking: Reported on 03/25/2020) 90 tablet 1  . ondansetron (ZOFRAN-ODT) 4 MG disintegrating tablet Take 1 tablet (4 mg total) by mouth every 6 (six) hours as needed for nausea. (Patient not taking: Reported on 12/05/2019) 20 tablet 0  . prochlorperazine (COMPAZINE) 10 MG tablet Take 1 tablet (10 mg total) by mouth every 6 (six) hours as needed for nausea or vomiting. (Patient not taking: Reported on 12/05/2019) 40 tablet 1   No current facility-administered medications for this visit.   Facility-Administered Medications Ordered in Other Visits  Medication Dose Route Frequency Provider Last Rate Last Admin  . 0.9 %  sodium chloride infusion   Intravenous Continuous [name], MD   Stopped at 05/08/20 1114  . fluorouracil (ADRUCIL) 5,000 mg in sodium  chloride 0.9 % 150 mL chemo infusion  5,000 mg Intravenous 1 day or 1 dose Charlaine Dalton R, MD   5,000 mg at 05/08/20 1334      .  PHYSICAL EXAMINATION: ECOG PERFORMANCE STATUS: 0 - Asymptomatic  Vitals:   05/08/20 0946  BP: (!) 141/70  Pulse: 64  Temp: 98.1 F (36.7 C)  SpO2: 100%   Filed Weights   05/08/20 0946  Weight: 194 lb (88 kg)    Physical Exam HENT:     Head: Normocephalic and atraumatic.     Mouth/Throat:     Pharynx: No oropharyngeal exudate.  Eyes:     Pupils: Pupils are equal, round, and reactive to light.  Cardiovascular:     Rate and Rhythm: Normal rate and regular rhythm.  Pulmonary:     Effort: No respiratory distress.     Breath sounds: No wheezing.  Abdominal:     General: Bowel sounds are normal. There is no distension.  Palpations: Abdomen is soft. There is no mass.     Tenderness: There is no abdominal tenderness. There is no guarding or rebound.  Musculoskeletal:        General: No tenderness. Normal range of motion.     Cervical back: Normal range of motion and neck supple.  Skin:    General: Skin is warm.  Neurological:     Mental Status: He is alert and oriented to person, place, and time.  Psychiatric:        Mood and Affect: Affect normal.    LABORATORY DATA:  I have reviewed the data as listed Lab Results  Component Value Date   WBC 3.6 (L) 05/08/2020   HGB 12.8 (L) 05/08/2020   HCT 36.3 (L) 05/08/2020   MCV 98.1 05/08/2020   PLT 99 (L) 05/08/2020   Recent Labs    04/08/20 0826 04/24/20 0918 05/08/20 0917  NA 139 140 140  K 3.8 4.1 3.8  CL 107 107 108  CO2 '24 25 26  '$ GLUCOSE 185* 188* 183*  BUN '9 11 11  '$ CREATININE 0.85 0.75 0.81  CALCIUM 8.3* 8.3* 8.2*  GFRNONAA >60 >60 >60  GFRAA >60 >60 >60  PROT 6.7 6.8 6.4*  ALBUMIN 3.3* 3.3* 3.2*  AST 36 29 33  ALT '25 21 20  '$ ALKPHOS 115 110 106  BILITOT 0.7 0.7 0.7    RADIOGRAPHIC STUDIES: I have personally reviewed the radiological images as listed and  agreed with the findings in the report. No results found.  ASSESSMENT & PLAN:   Cancer of right colon ([place]) #Right-sided colon adenocarcinoma-with synchronous metastasis to liver/unresectable.  Currently on FOLFOX plus Avastin; PET June 2nd; over all STABLE; ~4m nodule/slight progression near anastamotic line; CEA-s slightly rising- 26. STABLE.    #Proceed with today- cycle 18 of FOLFOX plus Avastin chemotherapy.  Labs today reviewed;  acceptable for treatment today-except for mild thrombocytopenia platelets 98.  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 STABLE.     # HTN- systolic 1316F  Recommend monitoring at home; STABLE.   # DISPOSITION: #FOLFOX+ Avastin today # follow up in 2 weeks- MD; labs- cbc/cmp/UA/ CEA; FOLFOX + avastin;pump off in 2 days; -Dr.B  All questions were answered. The patient knows to call the clinic with any problems, questions or concerns.    GCammie Sickle MD 05/08/2020 7:47 PM

## 2020-05-08 NOTE — Assessment & Plan Note (Addendum)
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  Currently on FOLFOX plus Avastin; PET June 2nd; over all STABLE; ~69mm nodule/slight progression near anastamotic line; CEA-s slightly rising- 26. STABLE.    #Proceed with today- cycle 18 of FOLFOX plus Avastin chemotherapy.  Labs today reviewed;  acceptable for treatment today-except for mild thrombocytopenia platelets 98.  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 STABLE.     # HTN- systolic 655V-  Recommend monitoring at home; STABLE.   # DISPOSITION: #FOLFOX+ Avastin today # follow up in 2 weeks- MD; labs- cbc/cmp/UA/ CEA; FOLFOX + avastin;pump off in 2 days; -Dr.B

## 2020-05-09 LAB — CEA: CEA: 27.9 ng/mL — ABNORMAL HIGH (ref 0.0–4.7)

## 2020-05-10 ENCOUNTER — Inpatient Hospital Stay: Payer: Medicare HMO

## 2020-05-10 ENCOUNTER — Other Ambulatory Visit: Payer: Self-pay

## 2020-05-10 VITALS — BP 176/80 | HR 67 | Temp 95.7°F | Resp 18

## 2020-05-10 DIAGNOSIS — C182 Malignant neoplasm of ascending colon: Secondary | ICD-10-CM | POA: Diagnosis not present

## 2020-05-10 DIAGNOSIS — Z5111 Encounter for antineoplastic chemotherapy: Secondary | ICD-10-CM | POA: Diagnosis not present

## 2020-05-10 DIAGNOSIS — C787 Secondary malignant neoplasm of liver and intrahepatic bile duct: Secondary | ICD-10-CM | POA: Diagnosis not present

## 2020-05-10 DIAGNOSIS — Z5112 Encounter for antineoplastic immunotherapy: Secondary | ICD-10-CM | POA: Diagnosis not present

## 2020-05-10 DIAGNOSIS — Z7189 Other specified counseling: Secondary | ICD-10-CM

## 2020-05-10 MED ORDER — HEPARIN SOD (PORK) LOCK FLUSH 100 UNIT/ML IV SOLN
500.0000 [IU] | Freq: Once | INTRAVENOUS | Status: AC | PRN
  Administered 2020-05-10: 500 [IU]
  Filled 2020-05-10: qty 5

## 2020-05-10 MED ORDER — HEPARIN SOD (PORK) LOCK FLUSH 100 UNIT/ML IV SOLN
INTRAVENOUS | Status: AC
  Filled 2020-05-10: qty 5

## 2020-05-10 MED ORDER — SODIUM CHLORIDE 0.9% FLUSH
10.0000 mL | INTRAVENOUS | Status: DC | PRN
  Administered 2020-05-10: 10 mL
  Filled 2020-05-10: qty 10

## 2020-05-22 ENCOUNTER — Encounter: Payer: Self-pay | Admitting: Internal Medicine

## 2020-05-22 ENCOUNTER — Inpatient Hospital Stay: Payer: Medicare HMO

## 2020-05-22 ENCOUNTER — Other Ambulatory Visit: Payer: Self-pay

## 2020-05-22 ENCOUNTER — Inpatient Hospital Stay: Payer: Medicare HMO | Admitting: Internal Medicine

## 2020-05-22 ENCOUNTER — Inpatient Hospital Stay: Payer: Medicare HMO | Attending: Internal Medicine

## 2020-05-22 VITALS — BP 171/84 | HR 77 | Resp 16

## 2020-05-22 DIAGNOSIS — C182 Malignant neoplasm of ascending colon: Secondary | ICD-10-CM | POA: Insufficient documentation

## 2020-05-22 DIAGNOSIS — C787 Secondary malignant neoplasm of liver and intrahepatic bile duct: Secondary | ICD-10-CM | POA: Insufficient documentation

## 2020-05-22 DIAGNOSIS — Z5112 Encounter for antineoplastic immunotherapy: Secondary | ICD-10-CM | POA: Insufficient documentation

## 2020-05-22 DIAGNOSIS — Z5111 Encounter for antineoplastic chemotherapy: Secondary | ICD-10-CM | POA: Diagnosis not present

## 2020-05-22 DIAGNOSIS — Z7189 Other specified counseling: Secondary | ICD-10-CM

## 2020-05-22 LAB — COMPREHENSIVE METABOLIC PANEL
ALT: 23 U/L (ref 0–44)
AST: 37 U/L (ref 15–41)
Albumin: 3.3 g/dL — ABNORMAL LOW (ref 3.5–5.0)
Alkaline Phosphatase: 116 U/L (ref 38–126)
Anion gap: 6 (ref 5–15)
BUN: 11 mg/dL (ref 8–23)
CO2: 25 mmol/L (ref 22–32)
Calcium: 8.2 mg/dL — ABNORMAL LOW (ref 8.9–10.3)
Chloride: 107 mmol/L (ref 98–111)
Creatinine, Ser: 0.73 mg/dL (ref 0.61–1.24)
GFR calc Af Amer: >60 mL/min (ref >60)
GFR calc non Af Amer: >60 mL/min (ref >60)
Glucose, Bld: 215 mg/dL — ABNORMAL HIGH (ref 70–99)
Potassium: 3.8 mmol/L (ref 3.5–5.1)
Sodium: 138 mmol/L (ref 135–145)
Total Bilirubin: 0.9 mg/dL (ref 0.3–1.2)
Total Protein: 6.6 g/dL (ref 6.5–8.1)

## 2020-05-22 LAB — URINALYSIS, COMPLETE (UACMP) WITH MICROSCOPIC
Bacteria, UA: NONE SEEN
Bilirubin Urine: NEGATIVE
Glucose, UA: 150 mg/dL — AB
Hgb urine dipstick: NEGATIVE
Ketones, ur: NEGATIVE mg/dL
Leukocytes,Ua: NEGATIVE
Nitrite: NEGATIVE
Protein, ur: NEGATIVE mg/dL
Specific Gravity, Urine: 1.014 (ref 1.005–1.030)
pH: 7 (ref 5.0–8.0)

## 2020-05-22 LAB — CBC WITH DIFFERENTIAL/PLATELET
Abs Immature Granulocytes: 0 10*3/uL (ref 0.00–0.07)
Basophils Absolute: 0.1 10*3/uL (ref 0.0–0.1)
Basophils Relative: 1 %
Eosinophils Absolute: 0.3 10*3/uL (ref 0.0–0.5)
Eosinophils Relative: 9 %
HCT: 37.5 % — ABNORMAL LOW (ref 39.0–52.0)
Hemoglobin: 12.9 g/dL — ABNORMAL LOW (ref 13.0–17.0)
Immature Granulocytes: 0 %
Lymphocytes Relative: 24 %
Lymphs Abs: 0.9 10*3/uL (ref 0.7–4.0)
MCH: 34.7 pg — ABNORMAL HIGH (ref 26.0–34.0)
MCHC: 34.4 g/dL (ref 30.0–36.0)
MCV: 100.8 fL — ABNORMAL HIGH (ref 80.0–100.0)
Monocytes Absolute: 0.5 10*3/uL (ref 0.1–1.0)
Monocytes Relative: 14 %
Neutro Abs: 1.8 10*3/uL (ref 1.7–7.7)
Neutrophils Relative %: 52 %
Platelets: 106 10*3/uL — ABNORMAL LOW (ref 150–400)
RBC: 3.72 MIL/uL — ABNORMAL LOW (ref 4.22–5.81)
RDW: 15.2 % (ref 11.5–15.5)
WBC: 3.6 10*3/uL — ABNORMAL LOW (ref 4.0–10.5)
nRBC: 0 % (ref 0.0–0.2)

## 2020-05-22 MED ORDER — LEUCOVORIN CALCIUM INJECTION 350 MG
379.0000 mg/m2 | Freq: Once | INTRAVENOUS | Status: AC
  Administered 2020-05-22: 800 mg via INTRAVENOUS
  Filled 2020-05-22: qty 25

## 2020-05-22 MED ORDER — SODIUM CHLORIDE 0.9 % IV SOLN
5.0000 mg/kg | Freq: Once | INTRAVENOUS | Status: AC
  Administered 2020-05-22: 400 mg via INTRAVENOUS
  Filled 2020-05-22: qty 16

## 2020-05-22 MED ORDER — OXALIPLATIN CHEMO INJECTION 100 MG/20ML
178.0000 mg | Freq: Once | INTRAVENOUS | Status: AC
  Administered 2020-05-22: 180 mg via INTRAVENOUS
  Filled 2020-05-22: qty 36

## 2020-05-22 MED ORDER — PALONOSETRON HCL INJECTION 0.25 MG/5ML
0.2500 mg | Freq: Once | INTRAVENOUS | Status: AC
  Administered 2020-05-22: 0.25 mg via INTRAVENOUS
  Filled 2020-05-22: qty 5

## 2020-05-22 MED ORDER — SODIUM CHLORIDE 0.9 % IV SOLN
2370.0000 mg/m2 | INTRAVENOUS | Status: DC
  Administered 2020-05-22: 5000 mg via INTRAVENOUS
  Filled 2020-05-22: qty 100

## 2020-05-22 MED ORDER — DEXAMETHASONE SODIUM PHOSPHATE 10 MG/ML IJ SOLN
10.0000 mg | Freq: Once | INTRAMUSCULAR | Status: AC
  Administered 2020-05-22: 10 mg via INTRAVENOUS
  Filled 2020-05-22: qty 1

## 2020-05-22 MED ORDER — SODIUM CHLORIDE 0.9 % IV SOLN
Freq: Once | INTRAVENOUS | Status: AC
  Filled 2020-05-22: qty 250

## 2020-05-22 MED ORDER — DEXTROSE 5 % IV SOLN
Freq: Once | INTRAVENOUS | Status: AC
  Filled 2020-05-22: qty 250

## 2020-05-22 MED ORDER — SODIUM CHLORIDE 0.9% FLUSH
10.0000 mL | INTRAVENOUS | Status: DC | PRN
  Administered 2020-05-22: 10 mL via INTRAVENOUS
  Filled 2020-05-22: qty 10

## 2020-05-22 NOTE — Progress Notes (Signed)
[place] NOTE  Patient Care Team: Sofie Hartigan, MD as PCP - General (Family Medicine) [name], MD as Consulting Physician (Hematology and Oncology)  CHIEF COMPLAINTS/PURPOSE OF CONSULTATION: Colon cancer  #  Oncology History Overview Note  # MAY 2020- 3. 03/09/19 Liver biopsy. Microscopic examination shows malignant cells with glandular architecture consistent with adenocarcinoma. The malignant cells are positive for CK20 and CDX-2. These findings support the clinical impression of metastatic colon adenocarcinoma. 4. 03/10/19 R hemicolectomy. Tumor site cecum. Adenocarcinoma. Mucinous features present. G2. No tumor deposits. Invades visceral peritoneum. No tumor perforation. LVI present. PNI not identified. All margins uninvolved. 1/12 LNs. PT4apN1. Periappendiceal inflammation c/w resolving abscess. Microsatellite stable (MSS). [Dr.Mettu; [facility]]  # SEP 4th 2020 [compared to May 2020]  Interval increase in size of the metastases to the hepatic dome, The metastasis to the left hepatic lobe is unchanged; 2.  New subcentimeter hypoattenuating lesion in the inferior right hepatic lobe, incompletely characterized on CT. 3.  Postsurgical changes following right hemicolectomy.  # OCT 2020- FOLFOX +avastin; CT dec 22nd 2020- [compared to [facility] sep 9th 2020]-Liver- slight progression versus stable disease.  # NGS/F-ONE-MUTATED K-RAS [G]  # PALLIATIVE CARE EVALUATION: 09/20/2019-Oscar [name]  # PAIN MANAGEMENT: NA   DIAGNOSIS: COLON CANCER  STAGE:  IV     ;  GOALS:Palliative  CURRENT/MOST RECENT THERAPY : FOLFOX+ avastin [C]    Cancer of right colon ([place])  07/05/2019 Initial Diagnosis   Cancer of right colon ([place])   07/24/2019 -  Chemotherapy   The patient had dexamethasone (DECADRON) 4 MG tablet, 8 mg, Oral, Daily, 1 of 1 cycle, Start date: --, End date: -- palonosetron (ALOXI) injection 0.25 mg, 0.25 mg, Intravenous,  Once, 21 of 23 cycles Administration: 0.25 mg  (07/24/2019), 0.25 mg (08/07/2019), 0.25 mg (08/21/2019), 0.25 mg (09/04/2019), 0.25 mg (09/20/2019), 0.25 mg (10/04/2019), 0.25 mg (10/16/2019), 0.25 mg (10/30/2019), 0.25 mg (11/22/2019), 0.25 mg (12/06/2019), 0.25 mg (12/20/2019), 0.25 mg (01/03/2020), 0.25 mg (01/29/2020), 0.25 mg (02/12/2020), 0.25 mg (02/26/2020), 0.25 mg (03/11/2020), 0.25 mg (03/25/2020), 0.25 mg (04/08/2020), 0.25 mg (04/24/2020), 0.25 mg (05/08/2020) leucovorin 800 mg in dextrose 5 % 250 mL infusion, 844 mg, Intravenous,  Once, 21 of 23 cycles Administration: 800 mg (07/24/2019), 800 mg (08/07/2019), 800 mg (08/21/2019), 800 mg (09/04/2019), 800 mg (09/20/2019), 800 mg (10/04/2019), 800 mg (10/16/2019), 800 mg (10/30/2019), 800 mg (11/22/2019), 800 mg (12/06/2019), 800 mg (12/20/2019), 800 mg (01/03/2020), 800 mg (01/29/2020), 800 mg (02/12/2020), 800 mg (02/26/2020), 800 mg (03/11/2020), 800 mg (03/25/2020), 800 mg (04/08/2020), 800 mg (04/24/2020), 800 mg (05/08/2020) oxaliplatin (ELOXATIN) 180 mg in dextrose 5 % 500 mL chemo infusion, 85 mg/m2 = 180 mg, Intravenous,  Once, 21 of 23 cycles Dose modification: 178 mg (original dose 85 mg/m2, Cycle 17, Reason: Other (see comments), Comment: insurance adjusted dose ) Administration: 180 mg (07/24/2019), 180 mg (08/07/2019), 180 mg (08/21/2019), 180 mg (09/04/2019), 180 mg (09/20/2019), 180 mg (10/04/2019), 180 mg (10/16/2019), 180 mg (10/30/2019), 180 mg (11/22/2019), 180 mg (12/06/2019), 180 mg (12/20/2019), 180 mg (01/03/2020), 180 mg (01/29/2020), 180 mg (02/12/2020), 180 mg (02/26/2020), 180 mg (03/11/2020), 180 mg (04/08/2020), 180 mg (04/24/2020), 180 mg (05/08/2020) fluorouracil (ADRUCIL) 5,000 mg in sodium chloride 0.9 % 150 mL chemo infusion, 5,050 mg, Intravenous, 1 Day/Dose, 21 of 23 cycles Administration: 5,000 mg (07/24/2019), 5,000 mg (08/07/2019), 5,000 mg (08/21/2019), 5,050 mg (09/04/2019), 5,000 mg (10/04/2019), 5,000 mg (10/16/2019), 5,000 mg (11/22/2019), 5,000 mg (12/06/2019), 5,000 mg (12/20/2019), 5,000 mg (01/03/2020), 5,000  mg (01/29/2020),  5,000 mg (02/12/2020), 5,000 mg (02/26/2020), 5,000 mg (03/11/2020), 5,000 mg (03/25/2020), 5,000 mg (04/08/2020), 5,000 mg (04/24/2020), 5,000 mg (05/08/2020) bevacizumab-bvzr (ZIRABEV) 400 mg in sodium chloride 0.9 % 100 mL chemo infusion, 5 mg/kg = 400 mg, Intravenous,  Once, 21 of 23 cycles Administration: 400 mg (08/07/2019), 400 mg (08/21/2019), 400 mg (09/04/2019), 400 mg (09/20/2019), 400 mg (10/04/2019), 400 mg (10/16/2019), 400 mg (10/30/2019), 400 mg (11/22/2019), 400 mg (12/06/2019), 400 mg (12/20/2019), 400 mg (01/03/2020), 400 mg (01/29/2020), 400 mg (02/12/2020), 400 mg (02/26/2020), 400 mg (03/11/2020), 400 mg (03/25/2020), 400 mg (04/08/2020), 400 mg (04/24/2020), 400 mg (05/08/2020)  for chemotherapy treatment.      HISTORY OF PRESENTING ILLNESS:  Oscar [name] 79 y.o.  male with metastatic colon cancer to the liver currently on FOLFOX plus Avastin is here for follow-up.  Patient denies any nausea vomiting or diarrhea. Continues to have mild tingling and numbness in extremities. However he is still able to play golf.  Denies any abdominal pain. Denies any falls.  Review of Systems  Constitutional: Positive for malaise/fatigue. Negative for chills, diaphoresis, fever and weight loss.  HENT: Negative for nosebleeds and sore throat.   Eyes: Negative for double vision.  Respiratory: Negative for cough, hemoptysis, sputum production, shortness of breath and wheezing.   Cardiovascular: Negative for chest pain, palpitations, orthopnea and leg swelling.  Gastrointestinal: Negative for abdominal pain, blood in stool, constipation, diarrhea, heartburn, melena, nausea and vomiting.  Genitourinary: Negative for dysuria, frequency and urgency.  Musculoskeletal: Positive for back pain and joint pain.  Skin: Negative.  Negative for itching and rash.  Neurological: Positive for tingling. Negative for dizziness, focal weakness, weakness and headaches.  Endo/Heme/Allergies: Does not bruise/bleed  easily.  Psychiatric/Behavioral: Negative for depression. The patient is not nervous/anxious and does not have insomnia.      MEDICAL HISTORY:  Past Medical History:  Diagnosis Date  . Arthritis   . Cancer ([place])    colon cancer 02/2019 per pt   . Diabetes mellitus without complication ([place])   . H/O colon cancer, stage IV   . Hyperlipemia   . Hypertension     SURGICAL HISTORY: Past Surgical History:  Procedure Laterality Date  . IR IMAGING GUIDED PORT INSERTION  07/20/2019  . JOINT REPLACEMENT      SOCIAL HISTORY: Social History   Socioeconomic History  . Marital status: Married    Spouse name: Not on file  . Number of children: Not on file  . Years of education: Not on file  . Highest education level: Not on file  Occupational History  . Not on file  Tobacco Use  . Smoking status: Current Every Day Smoker    Packs/day: 0.25    Types: Cigarettes  . Smokeless tobacco: Never Used  . Tobacco comment: 1 to 2 cigarettes a day occasionally  Vaping Use  . Vaping Use: Never used  Substance and Sexual Activity  . Alcohol use: No  . Drug use: No  . Sexual activity: Not on file  Other Topics Concern  . Not on file  Social History Narrative   Recruitment consultant retd; lives in [place]; smoking 3cig/day; [3/4 ppd x started at 9 years]; no alcohol. Son & daughter; wife dementia [waiting for placement].    Social Determinants of Health   Financial Resource Strain:   . Difficulty of Paying Living Expenses:   Food Insecurity:   . Worried About Charity fundraiser in the Last Year:   . Arboriculturist in  the Last Year:   Transportation Needs:   . Film/video editor (Medical):   Marland Kitchen Lack of Transportation (Non-Medical):   Physical Activity:   . Days of Exercise per Week:   . Minutes of Exercise per Session:   Stress:   . Feeling of Stress :   Social Connections:   . Frequency of Communication with Friends and Family:   . Frequency of Social Gatherings with  Friends and Family:   . Attends Religious Services:   . Active Member of Clubs or Organizations:   . Attends Archivist Meetings:   Marland Kitchen Marital Status:   Intimate Partner Violence:   . Fear of Current or Ex-Partner:   . Emotionally Abused:   Marland Kitchen Physically Abused:   . Sexually Abused:     FAMILY HISTORY: Family History  Problem Relation Age of Onset  . Peptic Ulcer Disease Father     ALLERGIES:  is allergic to ace inhibitors.  MEDICATIONS:  Current Outpatient Medications  Medication Sig Dispense Refill  . amLODipine (NORVASC) 5 MG tablet Take 5 mg by mouth daily.    . fluticasone (FLONASE) 50 MCG/ACT nasal spray Place 1 spray into both nostrils daily as needed.     . meloxicam (MOBIC) 15 MG tablet Take 1 tablet (15 mg total) by mouth daily. 90 tablet 0  . pravastatin (PRAVACHOL) 40 MG tablet Take 40 mg by mouth daily.    . tamsulosin (FLOMAX) 0.4 MG CAPS capsule Take 1 capsule (0.4 mg total) by mouth daily. 30 capsule 11  . lidocaine-prilocaine (EMLA) cream Apply 1 application topically as needed. (Patient not taking: Reported on 12/05/2019) 30 g 0  . montelukast (SINGULAIR) 10 MG tablet TAKE 1 TABLET BY MOUTH EVERYDAY AT BEDTIME (Patient not taking: Reported on 03/25/2020) 90 tablet 1  . ondansetron (ZOFRAN-ODT) 4 MG disintegrating tablet Take 1 tablet (4 mg total) by mouth every 6 (six) hours as needed for nausea. (Patient not taking: Reported on 12/05/2019) 20 tablet 0  . prochlorperazine (COMPAZINE) 10 MG tablet Take 1 tablet (10 mg total) by mouth every 6 (six) hours as needed for nausea or vomiting. (Patient not taking: Reported on 12/05/2019) 40 tablet 1   No current facility-administered medications for this visit.   Facility-Administered Medications Ordered in Other Visits  Medication Dose Route Frequency Provider Last Rate Last Admin  . dextrose 5 % solution   Intravenous Once Charlaine Dalton R, MD      . fluorouracil (ADRUCIL) 5,000 mg in sodium chloride 0.9 %  150 mL chemo infusion  2,370 mg/m2 (Treatment Plan Recorded) Intravenous 1 day or 1 dose Charlaine Dalton R, MD      . leucovorin 800 mg in dextrose 5 % 250 mL infusion  379 mg/m2 (Treatment Plan Recorded) Intravenous Once Cammie Sickle, MD      . oxaliplatin (ELOXATIN) 180 mg in dextrose 5 % 500 mL chemo infusion  180 mg Intravenous Once Charlaine Dalton R, MD      . sodium chloride flush (NS) 0.9 % injection 10 mL  10 mL Intravenous PRN Cammie Sickle, MD   10 mL at 05/22/20 0929      .  PHYSICAL EXAMINATION: ECOG PERFORMANCE STATUS: 0 - Asymptomatic  Vitals:   05/22/20 0935  BP: (!) 164/80  Pulse: 73  Resp: 16  Temp: (!) 97.5 F (36.4 C)  SpO2: 100%   Filed Weights   05/22/20 0935  Weight: 193 lb 12.8 oz (87.9 kg)    Physical Exam  HENT:     Head: Normocephalic and atraumatic.     Mouth/Throat:     Pharynx: No oropharyngeal exudate.  Eyes:     Pupils: Pupils are equal, round, and reactive to light.  Cardiovascular:     Rate and Rhythm: Normal rate and regular rhythm.  Pulmonary:     Effort: No respiratory distress.     Breath sounds: No wheezing.  Abdominal:     General: Bowel sounds are normal. There is no distension.     Palpations: Abdomen is soft. There is no mass.     Tenderness: There is no abdominal tenderness. There is no guarding or rebound.  Musculoskeletal:        General: No tenderness. Normal range of motion.     Cervical back: Normal range of motion and neck supple.  Skin:    General: Skin is warm.  Neurological:     Mental Status: He is alert and oriented to person, place, and time.  Psychiatric:        Mood and Affect: Affect normal.    LABORATORY DATA:  I have reviewed the data as listed Lab Results  Component Value Date   WBC 3.6 (L) 05/22/2020   HGB 12.9 (L) 05/22/2020   HCT 37.5 (L) 05/22/2020   MCV 100.8 (H) 05/22/2020   PLT 106 (L) 05/22/2020   Recent Labs    04/24/20 0918 05/08/20 0917 05/22/20 0924  NA  140 140 138  K 4.1 3.8 3.8  CL 107 108 107  CO2 _0 GLUCOSE 188* 183* 215*  BUN _1 CREATININE 0.75 0.81 0.73  CALCIUM 8.3* 8.2* 8.2*  GFRNONAA >60 >60 >60  GFRAA >60 >60 >60  PROT 6.8 6.4* 6.6  ALBUMIN 3.3* 3.2* 3.3*  AST 29 33 37  ALT _2 ALKPHOS 110 106 116  BILITOT 0.7 0.7 0.9    RADIOGRAPHIC STUDIES: I have personally reviewed the radiological images as listed and agreed with the findings in the report. No results found.  ASSESSMENT & PLAN:   Cancer of right colon ([place]) #Right-sided colon adenocarcinoma-with synchronous metastasis to liver/unresectable.  Currently on FOLFOX plus Avastin; PET June 2nd; over all STABLE; ~70m nodule/slight progression near anastamotic line; CEA-s slightly rising- 27; STABLE.     #Proceed with today- cycle 19 of FOLFOX plus Avastin chemotherapy.  Labs today reviewed;  acceptable for treatment today-except for mild thrombocytopenia platelets -102. Will order scan at next visit.   # PN- G-1-2; sec to oxaliplatin. STABLE.   # HTN- systolic 1742V  Recommend monitoring at home;STABLE.    # DISPOSITION: #FOLFOX+ Avastin today # follow up in 2 weeks- MD; labs- cbc/cmp/UA/ CEA; FOLFOX + avastin;pump off in 2 days; -Dr.B  All questions were answered. The patient knows to call the clinic with any problems, questions or concerns.    GCammie Sickle MD 05/22/2020 11:07 AM

## 2020-05-22 NOTE — Assessment & Plan Note (Addendum)
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  Currently on FOLFOX plus Avastin; PET June 2nd; over all STABLE; ~11mm nodule/slight progression near anastamotic line; CEA-s slightly rising- 27; STABLE.     #Proceed with today- cycle 19 of FOLFOX plus Avastin chemotherapy.  Labs today reviewed;  acceptable for treatment today-except for mild thrombocytopenia platelets -102. Will order scan at next visit.   # PN- G-1-2; sec to oxaliplatin. STABLE.   # HTN- systolic 778E-  Recommend monitoring at home;STABLE.    # DISPOSITION: #FOLFOX+ Avastin today # follow up in 2 weeks- MD; labs- cbc/cmp/UA/ CEA; FOLFOX + avastin;pump off in 2 days; -Dr.B

## 2020-05-23 DIAGNOSIS — C182 Malignant neoplasm of ascending colon: Secondary | ICD-10-CM | POA: Diagnosis not present

## 2020-05-23 LAB — CEA: CEA: 28 ng/mL — ABNORMAL HIGH (ref 0.0–4.7)

## 2020-05-24 ENCOUNTER — Other Ambulatory Visit: Payer: Self-pay

## 2020-05-24 ENCOUNTER — Inpatient Hospital Stay: Payer: Medicare HMO

## 2020-05-24 VITALS — BP 175/78 | HR 71

## 2020-05-24 DIAGNOSIS — Z7189 Other specified counseling: Secondary | ICD-10-CM

## 2020-05-24 DIAGNOSIS — Z5111 Encounter for antineoplastic chemotherapy: Secondary | ICD-10-CM | POA: Diagnosis not present

## 2020-05-24 DIAGNOSIS — C182 Malignant neoplasm of ascending colon: Secondary | ICD-10-CM

## 2020-05-24 DIAGNOSIS — C787 Secondary malignant neoplasm of liver and intrahepatic bile duct: Secondary | ICD-10-CM | POA: Diagnosis not present

## 2020-05-24 DIAGNOSIS — Z5112 Encounter for antineoplastic immunotherapy: Secondary | ICD-10-CM | POA: Diagnosis not present

## 2020-05-24 MED ORDER — HEPARIN SOD (PORK) LOCK FLUSH 100 UNIT/ML IV SOLN
INTRAVENOUS | Status: AC
  Filled 2020-05-24: qty 5

## 2020-05-24 MED ORDER — HEPARIN SOD (PORK) LOCK FLUSH 100 UNIT/ML IV SOLN
500.0000 [IU] | Freq: Once | INTRAVENOUS | Status: AC | PRN
  Administered 2020-05-24: 500 [IU]
  Filled 2020-05-24: qty 5

## 2020-05-24 MED ORDER — SODIUM CHLORIDE 0.9% FLUSH
10.0000 mL | INTRAVENOUS | Status: DC | PRN
  Administered 2020-05-24: 10 mL
  Filled 2020-05-24: qty 10

## 2020-06-10 ENCOUNTER — Other Ambulatory Visit: Payer: Self-pay | Admitting: Internal Medicine

## 2020-06-10 ENCOUNTER — Inpatient Hospital Stay: Payer: Medicare HMO

## 2020-06-10 ENCOUNTER — Inpatient Hospital Stay: Payer: Medicare HMO | Admitting: Internal Medicine

## 2020-06-10 ENCOUNTER — Other Ambulatory Visit: Payer: Self-pay

## 2020-06-10 ENCOUNTER — Encounter: Payer: Self-pay | Admitting: Internal Medicine

## 2020-06-10 VITALS — BP 131/56 | HR 71 | Temp 98.1°F | Resp 16 | Ht 72.0 in | Wt 197.0 lb

## 2020-06-10 DIAGNOSIS — Z5112 Encounter for antineoplastic immunotherapy: Secondary | ICD-10-CM | POA: Diagnosis not present

## 2020-06-10 DIAGNOSIS — Z5111 Encounter for antineoplastic chemotherapy: Secondary | ICD-10-CM | POA: Diagnosis not present

## 2020-06-10 DIAGNOSIS — C182 Malignant neoplasm of ascending colon: Secondary | ICD-10-CM | POA: Diagnosis not present

## 2020-06-10 DIAGNOSIS — C787 Secondary malignant neoplasm of liver and intrahepatic bile duct: Secondary | ICD-10-CM | POA: Diagnosis not present

## 2020-06-10 DIAGNOSIS — Z7189 Other specified counseling: Secondary | ICD-10-CM

## 2020-06-10 LAB — URINALYSIS, COMPLETE (UACMP) WITH MICROSCOPIC
Bacteria, UA: NONE SEEN
Glucose, UA: 150 mg/dL — AB
Hgb urine dipstick: NEGATIVE
Ketones, ur: 5 mg/dL — AB
Leukocytes,Ua: NEGATIVE
Nitrite: NEGATIVE
Protein, ur: 30 mg/dL — AB
Specific Gravity, Urine: 1.033 — ABNORMAL HIGH (ref 1.005–1.030)
pH: 5 (ref 5.0–8.0)

## 2020-06-10 LAB — COMPREHENSIVE METABOLIC PANEL
ALT: 23 U/L (ref 0–44)
AST: 35 U/L (ref 15–41)
Albumin: 3.2 g/dL — ABNORMAL LOW (ref 3.5–5.0)
Alkaline Phosphatase: 112 U/L (ref 38–126)
Anion gap: 6 (ref 5–15)
BUN: 11 mg/dL (ref 8–23)
CO2: 25 mmol/L (ref 22–32)
Calcium: 8.2 mg/dL — ABNORMAL LOW (ref 8.9–10.3)
Chloride: 107 mmol/L (ref 98–111)
Creatinine, Ser: 0.89 mg/dL (ref 0.61–1.24)
GFR calc Af Amer: >60 mL/min (ref >60)
GFR calc non Af Amer: >60 mL/min (ref >60)
Glucose, Bld: 205 mg/dL — ABNORMAL HIGH (ref 70–99)
Potassium: 3.7 mmol/L (ref 3.5–5.1)
Sodium: 138 mmol/L (ref 135–145)
Total Bilirubin: 0.6 mg/dL (ref 0.3–1.2)
Total Protein: 6.6 g/dL (ref 6.5–8.1)

## 2020-06-10 LAB — CBC WITH DIFFERENTIAL/PLATELET
Abs Immature Granulocytes: 0 10*3/uL (ref 0.00–0.07)
Basophils Absolute: 0 10*3/uL (ref 0.0–0.1)
Basophils Relative: 1 %
Eosinophils Absolute: 0.3 10*3/uL (ref 0.0–0.5)
Eosinophils Relative: 10 %
HCT: 35.7 % — ABNORMAL LOW (ref 39.0–52.0)
Hemoglobin: 12.5 g/dL — ABNORMAL LOW (ref 13.0–17.0)
Immature Granulocytes: 0 %
Lymphocytes Relative: 30 %
Lymphs Abs: 0.9 10*3/uL (ref 0.7–4.0)
MCH: 34.8 pg — ABNORMAL HIGH (ref 26.0–34.0)
MCHC: 35 g/dL (ref 30.0–36.0)
MCV: 99.4 fL (ref 80.0–100.0)
Monocytes Absolute: 0.6 10*3/uL (ref 0.1–1.0)
Monocytes Relative: 18 %
Neutro Abs: 1.3 10*3/uL — ABNORMAL LOW (ref 1.7–7.7)
Neutrophils Relative %: 41 %
Platelets: 122 10*3/uL — ABNORMAL LOW (ref 150–400)
RBC: 3.59 MIL/uL — ABNORMAL LOW (ref 4.22–5.81)
RDW: 14.7 % (ref 11.5–15.5)
WBC: 3.1 10*3/uL — ABNORMAL LOW (ref 4.0–10.5)
nRBC: 0 % (ref 0.0–0.2)

## 2020-06-10 MED ORDER — LEUCOVORIN CALCIUM INJECTION 350 MG
379.0000 mg/m2 | Freq: Once | INTRAVENOUS | Status: AC
  Administered 2020-06-10: 800 mg via INTRAVENOUS
  Filled 2020-06-10: qty 17.5

## 2020-06-10 MED ORDER — SODIUM CHLORIDE 0.9 % IV SOLN
2369.0000 mg/m2 | INTRAVENOUS | Status: DC
  Administered 2020-06-10: 5000 mg via INTRAVENOUS
  Filled 2020-06-10: qty 100

## 2020-06-10 MED ORDER — DEXAMETHASONE SODIUM PHOSPHATE 10 MG/ML IJ SOLN
10.0000 mg | Freq: Once | INTRAMUSCULAR | Status: AC
  Administered 2020-06-10: 10 mg via INTRAVENOUS
  Filled 2020-06-10: qty 1

## 2020-06-10 MED ORDER — SODIUM CHLORIDE 0.9 % IV SOLN
5.0000 mg/kg | Freq: Once | INTRAVENOUS | Status: AC
  Administered 2020-06-10: 400 mg via INTRAVENOUS
  Filled 2020-06-10: qty 16

## 2020-06-10 MED ORDER — OXALIPLATIN CHEMO INJECTION 100 MG/20ML
178.0000 mg | Freq: Once | INTRAVENOUS | Status: AC
  Administered 2020-06-10: 180 mg via INTRAVENOUS
  Filled 2020-06-10: qty 36

## 2020-06-10 MED ORDER — PALONOSETRON HCL INJECTION 0.25 MG/5ML
0.2500 mg | Freq: Once | INTRAVENOUS | Status: AC
  Administered 2020-06-10: 0.25 mg via INTRAVENOUS
  Filled 2020-06-10: qty 5

## 2020-06-10 MED ORDER — DEXTROSE 5 % IV SOLN
Freq: Once | INTRAVENOUS | Status: AC
  Filled 2020-06-10: qty 250

## 2020-06-10 MED ORDER — SODIUM CHLORIDE 0.9 % IV SOLN
INTRAVENOUS | Status: DC
  Filled 2020-06-10: qty 250

## 2020-06-10 NOTE — Assessment & Plan Note (Addendum)
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  Currently on FOLFOX plus Avastin; PET June 2nd; over all STABLE; ~5mm nodule/slight progression near anastamotic line; CEA-s slightly rising- 27; STABLE.   #Proceed with today- cycle 19 of FOLFOX plus Avastin chemotherapy.  Labs today reviewed;  acceptable for treatment today-except for mild thrombocytopenia platelets -122. Will order scan today.  # PN- G-1-2; sec to oxaliplatin.  Stable  # HTN- systolic 573U stable recommend monitoring at home;  # COVID BOOSTER: Discussed given patient's diagnosis and other comorbidities/therapies-patient would be considered immunocompromised.  As per CDC recommendation/FDA approval-I would recommend booster vaccine.  Patient is interested.     # DISPOSITION: #FOLFOX+ Avastin today # follow up in 2 weeks- MD; labs- cbc/cmp/UA/ CEA; FOLFOX + avastin;pump off in 2 days;CT C/A/P priro- -Dr.B

## 2020-06-10 NOTE — Progress Notes (Signed)
[facility] NOTE  Patient Care Team: Sofie Hartigan, MD as PCP - General (Family Medicine) [name], MD as Consulting Physician (Hematology and Oncology)  CHIEF COMPLAINTS/PURPOSE OF CONSULTATION: Colon cancer  #  Oncology History Overview Note  # MAY 2020- 3. 03/09/19 Liver biopsy. Microscopic examination shows malignant cells with glandular architecture consistent with adenocarcinoma. The malignant cells are positive for CK20 and CDX-2. These findings support the clinical impression of metastatic colon adenocarcinoma. 4. 03/10/19 R hemicolectomy. Tumor site cecum. Adenocarcinoma. Mucinous features present. G2. No tumor deposits. Invades visceral peritoneum. No tumor perforation. LVI present. PNI not identified. All margins uninvolved. 1/12 LNs. PT4apN1. Periappendiceal inflammation c/w resolving abscess. Microsatellite stable (MSS). [Dr.Mettu; [facility]]  # SEP 4th 2020 [compared to May 2020]  Interval increase in size of the metastases to the hepatic dome, The metastasis to the left hepatic lobe is unchanged; 2.  New subcentimeter hypoattenuating lesion in the inferior right hepatic lobe, incompletely characterized on CT. 3.  Postsurgical changes following right hemicolectomy.  # OCT 2020- FOLFOX +avastin; CT dec 22nd 2020- [compared to [facility] sep 9th 2020]-Liver- slight progression versus stable disease.  # NGS/F-ONE-MUTATED K-RAS [G]  # PALLIATIVE CARE EVALUATION: 09/20/2019-[name]  # PAIN MANAGEMENT: NA   DIAGNOSIS: COLON CANCER  STAGE:  IV     ;  GOALS:Palliative  CURRENT/MOST RECENT THERAPY : FOLFOX+ avastin [C]    Cancer of right colon ([place])  07/05/2019 Initial Diagnosis   Cancer of right colon ([place])   07/24/2019 -  Chemotherapy   The patient had dexamethasone (DECADRON) 4 MG tablet, 8 mg, Oral, Daily, 1 of 1 cycle, Start date: --, End date: -- palonosetron (ALOXI) injection 0.25 mg, 0.25 mg, Intravenous,  Once, 21 of 23 cycles Administration: 0.25 mg  (07/24/2019), 0.25 mg (08/07/2019), 0.25 mg (08/21/2019), 0.25 mg (09/04/2019), 0.25 mg (09/20/2019), 0.25 mg (10/04/2019), 0.25 mg (10/16/2019), 0.25 mg (10/30/2019), 0.25 mg (11/22/2019), 0.25 mg (12/06/2019), 0.25 mg (12/20/2019), 0.25 mg (01/03/2020), 0.25 mg (01/29/2020), 0.25 mg (02/12/2020), 0.25 mg (02/26/2020), 0.25 mg (03/11/2020), 0.25 mg (03/25/2020), 0.25 mg (04/08/2020), 0.25 mg (04/24/2020), 0.25 mg (05/08/2020), 0.25 mg (05/22/2020) leucovorin 800 mg in dextrose 5 % 250 mL infusion, 844 mg, Intravenous,  Once, 21 of 23 cycles Administration: 800 mg (07/24/2019), 800 mg (08/07/2019), 800 mg (08/21/2019), 800 mg (09/04/2019), 800 mg (09/20/2019), 800 mg (10/04/2019), 800 mg (10/16/2019), 800 mg (10/30/2019), 800 mg (11/22/2019), 800 mg (12/06/2019), 800 mg (12/20/2019), 800 mg (01/03/2020), 800 mg (01/29/2020), 800 mg (02/12/2020), 800 mg (02/26/2020), 800 mg (03/11/2020), 800 mg (03/25/2020), 800 mg (04/08/2020), 800 mg (04/24/2020), 800 mg (05/08/2020), 800 mg (05/22/2020) oxaliplatin (ELOXATIN) 180 mg in dextrose 5 % 500 mL chemo infusion, 85 mg/m2 = 180 mg, Intravenous,  Once, 21 of 23 cycles Dose modification: 178 mg (original dose 85 mg/m2, Cycle 17, Reason: Other (see comments), Comment: insurance adjusted dose ) Administration: 180 mg (07/24/2019), 180 mg (08/07/2019), 180 mg (08/21/2019), 180 mg (09/04/2019), 180 mg (09/20/2019), 180 mg (10/04/2019), 180 mg (10/16/2019), 180 mg (10/30/2019), 180 mg (11/22/2019), 180 mg (12/06/2019), 180 mg (12/20/2019), 180 mg (01/03/2020), 180 mg (01/29/2020), 180 mg (02/12/2020), 180 mg (02/26/2020), 180 mg (03/11/2020), 180 mg (04/08/2020), 180 mg (04/24/2020), 180 mg (05/08/2020), 180 mg (05/22/2020) fluorouracil (ADRUCIL) 5,000 mg in sodium chloride 0.9 % 150 mL chemo infusion, 5,050 mg, Intravenous, 1 Day/Dose, 21 of 23 cycles Administration: 5,000 mg (07/24/2019), 5,000 mg (08/07/2019), 5,000 mg (08/21/2019), 5,050 mg (09/04/2019), 5,000 mg (10/04/2019), 5,000 mg (10/16/2019), 5,000 mg (11/22/2019), 5,000 mg  (12/06/2019),  5,000 mg (12/20/2019), 5,000 mg (01/03/2020), 5,000 mg (01/29/2020), 5,000 mg (02/12/2020), 5,000 mg (02/26/2020), 5,000 mg (03/11/2020), 5,000 mg (03/25/2020), 5,000 mg (04/08/2020), 5,000 mg (04/24/2020), 5,000 mg (05/08/2020), 5,000 mg (05/22/2020) bevacizumab-bvzr (ZIRABEV) 400 mg in sodium chloride 0.9 % 100 mL chemo infusion, 5 mg/kg = 400 mg, Intravenous,  Once, 21 of 23 cycles Administration: 400 mg (08/07/2019), 400 mg (08/21/2019), 400 mg (09/04/2019), 400 mg (09/20/2019), 400 mg (10/04/2019), 400 mg (10/16/2019), 400 mg (10/30/2019), 400 mg (11/22/2019), 400 mg (12/06/2019), 400 mg (12/20/2019), 400 mg (01/03/2020), 400 mg (01/29/2020), 400 mg (02/12/2020), 400 mg (02/26/2020), 400 mg (03/11/2020), 400 mg (03/25/2020), 400 mg (04/08/2020), 400 mg (04/24/2020), 400 mg (05/08/2020), 400 mg (05/22/2020)  for chemotherapy treatment.      HISTORY OF PRESENTING ILLNESS:  Oscar [name] 79 y.o.  male with metastatic colon cancer to the liver currently on FOLFOX plus Avastin is here for follow-up.  Patient continues to have mild tingling and numbness in his extremities.  Not any worse.  Continues to complain of shoulder pain not any worse no nausea no vomiting pain abdominal pain no weight loss.  Review of Systems  Constitutional: Positive for malaise/fatigue. Negative for chills, diaphoresis, fever and weight loss.  HENT: Negative for nosebleeds and sore throat.   Eyes: Negative for double vision.  Respiratory: Negative for cough, hemoptysis, sputum production, shortness of breath and wheezing.   Cardiovascular: Negative for chest pain, palpitations, orthopnea and leg swelling.  Gastrointestinal: Negative for abdominal pain, blood in stool, constipation, diarrhea, heartburn, melena, nausea and vomiting.  Genitourinary: Negative for dysuria, frequency and urgency.  Musculoskeletal: Positive for back pain and joint pain.  Skin: Negative.  Negative for itching and rash.  Neurological: Positive for tingling.  Negative for dizziness, focal weakness, weakness and headaches.  Endo/Heme/Allergies: Does not bruise/bleed easily.  Psychiatric/Behavioral: Negative for depression. The patient is not nervous/anxious and does not have insomnia.      MEDICAL HISTORY:  Past Medical History:  Diagnosis Date  . Arthritis   . Cancer ([place])    colon cancer 02/2019 per pt   . Diabetes mellitus without complication ([place])   . H/O colon cancer, stage IV   . Hyperlipemia   . Hypertension     SURGICAL HISTORY: Past Surgical History:  Procedure Laterality Date  . IR IMAGING GUIDED PORT INSERTION  07/20/2019  . JOINT REPLACEMENT      SOCIAL HISTORY: Social History   Socioeconomic History  . Marital status: Married    Spouse name: Not on file  . Number of children: Not on file  . Years of education: Not on file  . Highest education level: Not on file  Occupational History  . Not on file  Tobacco Use  . Smoking status: Current Every Day Smoker    Packs/day: 0.25    Types: Cigarettes  . Smokeless tobacco: Never Used  . Tobacco comment: 1 to 2 cigarettes a day occasionally  Vaping Use  . Vaping Use: Never used  Substance and Sexual Activity  . Alcohol use: No  . Drug use: No  . Sexual activity: Not on file  Other Topics Concern  . Not on file  Social History Narrative   Recruitment consultant retd; lives in [place]; smoking 3cig/day; [3/4 ppd x started at 9 years]; no alcohol. Son & daughter; wife dementia [waiting for placement].    Social Determinants of Health   Financial Resource Strain:   . Difficulty of Paying Living Expenses: Not on file  Food Insecurity:   .  Worried About Charity fundraiser in the Last Year: Not on file  . Ran Out of Food in the Last Year: Not on file  Transportation Needs:   . Lack of Transportation (Medical): Not on file  . Lack of Transportation (Non-Medical): Not on file  Physical Activity:   . Days of Exercise per Week: Not on file  . Minutes of  Exercise per Session: Not on file  Stress:   . Feeling of Stress : Not on file  Social Connections:   . Frequency of Communication with Friends and Family: Not on file  . Frequency of Social Gatherings with Friends and Family: Not on file  . Attends Religious Services: Not on file  . Active Member of Clubs or Organizations: Not on file  . Attends Archivist Meetings: Not on file  . Marital Status: Not on file  Intimate Partner Violence:   . Fear of Current or Ex-Partner: Not on file  . Emotionally Abused: Not on file  . Physically Abused: Not on file  . Sexually Abused: Not on file    FAMILY HISTORY: Family History  Problem Relation Age of Onset  . Peptic Ulcer Disease Father     ALLERGIES:  is allergic to ace inhibitors.  MEDICATIONS:  Current Outpatient Medications  Medication Sig Dispense Refill  . amLODipine (NORVASC) 5 MG tablet Take 5 mg by mouth daily.    . fluticasone (FLONASE) 50 MCG/ACT nasal spray Place 1 spray into both nostrils daily as needed.     . meloxicam (MOBIC) 15 MG tablet Take 1 tablet (15 mg total) by mouth daily. 90 tablet 0  . pravastatin (PRAVACHOL) 40 MG tablet Take 40 mg by mouth daily.    . tamsulosin (FLOMAX) 0.4 MG CAPS capsule Take 1 capsule (0.4 mg total) by mouth daily. 30 capsule 11  . lidocaine-prilocaine (EMLA) cream Apply 1 application topically as needed. (Patient not taking: Reported on 12/05/2019) 30 g 0  . montelukast (SINGULAIR) 10 MG tablet TAKE 1 TABLET BY MOUTH EVERYDAY AT BEDTIME (Patient not taking: Reported on 03/25/2020) 90 tablet 1  . ondansetron (ZOFRAN-ODT) 4 MG disintegrating tablet Take 1 tablet (4 mg total) by mouth every 6 (six) hours as needed for nausea. (Patient not taking: Reported on 12/05/2019) 20 tablet 0  . prochlorperazine (COMPAZINE) 10 MG tablet Take 1 tablet (10 mg total) by mouth every 6 (six) hours as needed for nausea or vomiting. (Patient not taking: Reported on 12/05/2019) 40 tablet 1   No current  facility-administered medications for this visit.      Marland Kitchen  PHYSICAL EXAMINATION: ECOG PERFORMANCE STATUS: 0 - Asymptomatic  Vitals:   06/10/20 0913  BP: (!) 131/56  Pulse: 71  Resp: 16  Temp: 98.1 F (36.7 C)  SpO2: 100%   Filed Weights   06/10/20 0913  Weight: 197 lb (89.4 kg)    Physical Exam HENT:     Head: Normocephalic and atraumatic.     Mouth/Throat:     Pharynx: No oropharyngeal exudate.  Eyes:     Pupils: Pupils are equal, round, and reactive to light.  Cardiovascular:     Rate and Rhythm: Normal rate and regular rhythm.  Pulmonary:     Effort: No respiratory distress.     Breath sounds: No wheezing.  Abdominal:     General: Bowel sounds are normal. There is no distension.     Palpations: Abdomen is soft. There is no mass.     Tenderness: There is no  abdominal tenderness. There is no guarding or rebound.  Musculoskeletal:        General: No tenderness. Normal range of motion.     Cervical back: Normal range of motion and neck supple.  Skin:    General: Skin is warm.  Neurological:     Mental Status: He is alert and oriented to person, place, and time.  Psychiatric:        Mood and Affect: Affect normal.    LABORATORY DATA:  I have reviewed the data as listed Lab Results  Component Value Date   WBC 3.1 (L) 06/10/2020   HGB 12.5 (L) 06/10/2020   HCT 35.7 (L) 06/10/2020   MCV 99.4 06/10/2020   PLT 122 (L) 06/10/2020   Recent Labs    05/08/20 0917 05/22/20 0924 06/10/20 0858  NA 140 138 138  K 3.8 3.8 3.7  CL 108 107 107  CO2 _0 GLUCOSE 183* 215* 205*  BUN _1 CREATININE 0.81 0.73 0.89  CALCIUM 8.2* 8.2* 8.2*  GFRNONAA >60 >60 >60  GFRAA >60 >60 >60  PROT 6.4* 6.6 6.6  ALBUMIN 3.2* 3.3* 3.2*  AST 33 37 35  ALT _2 ALKPHOS 106 116 112  BILITOT 0.7 0.9 0.6    RADIOGRAPHIC STUDIES: I have personally reviewed the radiological images as listed and agreed with the findings in the report. No results  found.  ASSESSMENT & PLAN:   Cancer of right colon ([place]) #Right-sided colon adenocarcinoma-with synchronous metastasis to liver/unresectable.  Currently on FOLFOX plus Avastin; PET June 2nd; over all STABLE; ~30m nodule/slight progression near anastamotic line; CEA-s slightly rising- 27; STABLE.   #Proceed with today- cycle 19 of FOLFOX plus Avastin chemotherapy.  Labs today reviewed;  acceptable for treatment today-except for mild thrombocytopenia platelets -122. Will order scan today.  # PN- G-1-2; sec to oxaliplatin.  Stable  # HTN- systolic 1154Mstable recommend monitoring at home;  # COVID BOOSTER: Discussed given patient's diagnosis and other comorbidities/therapies-patient would be considered immunocompromised.  As per CDC recommendation/FDA approval-I would recommend booster vaccine.  Patient is interested.     # DISPOSITION: #FOLFOX+ Avastin today # follow up in 2 weeks- MD; labs- cbc/cmp/UA/ CEA; FOLFOX + avastin;pump off in 2 days;CT C/A/P priro- -Dr.B  All questions were answered. The patient knows to call the clinic with any problems, questions or concerns.    GCammie Sickle MD 06/10/2020 9:37 AM

## 2020-06-11 LAB — CEA: CEA: 32.6 ng/mL — ABNORMAL HIGH (ref 0.0–4.7)

## 2020-06-12 ENCOUNTER — Other Ambulatory Visit: Payer: Self-pay

## 2020-06-12 ENCOUNTER — Inpatient Hospital Stay: Payer: Medicare HMO

## 2020-06-12 DIAGNOSIS — C182 Malignant neoplasm of ascending colon: Secondary | ICD-10-CM | POA: Diagnosis not present

## 2020-06-12 DIAGNOSIS — Z5111 Encounter for antineoplastic chemotherapy: Secondary | ICD-10-CM | POA: Diagnosis not present

## 2020-06-12 DIAGNOSIS — Z7189 Other specified counseling: Secondary | ICD-10-CM

## 2020-06-12 DIAGNOSIS — Z5112 Encounter for antineoplastic immunotherapy: Secondary | ICD-10-CM | POA: Diagnosis not present

## 2020-06-12 DIAGNOSIS — C787 Secondary malignant neoplasm of liver and intrahepatic bile duct: Secondary | ICD-10-CM | POA: Diagnosis not present

## 2020-06-12 MED ORDER — SODIUM CHLORIDE 0.9% FLUSH
10.0000 mL | INTRAVENOUS | Status: DC | PRN
  Administered 2020-06-12: 10 mL
  Filled 2020-06-12: qty 10

## 2020-06-12 MED ORDER — HEPARIN SOD (PORK) LOCK FLUSH 100 UNIT/ML IV SOLN
INTRAVENOUS | Status: AC
  Filled 2020-06-12: qty 5

## 2020-06-12 MED ORDER — HEPARIN SOD (PORK) LOCK FLUSH 100 UNIT/ML IV SOLN
500.0000 [IU] | Freq: Once | INTRAVENOUS | Status: AC | PRN
  Administered 2020-06-12: 500 [IU]
  Filled 2020-06-12: qty 5

## 2020-06-19 ENCOUNTER — Ambulatory Visit
Admission: RE | Admit: 2020-06-19 | Discharge: 2020-06-19 | Disposition: A | Discharge status: Home / Self Care | Payer: Medicare HMO | Source: Ambulatory Visit | Attending: Internal Medicine | Admitting: Internal Medicine

## 2020-06-19 ENCOUNTER — Other Ambulatory Visit: Payer: Self-pay

## 2020-06-19 DIAGNOSIS — K8020 Calculus of gallbladder without cholecystitis without obstruction: Secondary | ICD-10-CM | POA: Diagnosis not present

## 2020-06-19 DIAGNOSIS — N200 Calculus of kidney: Secondary | ICD-10-CM | POA: Diagnosis not present

## 2020-06-19 DIAGNOSIS — C182 Malignant neoplasm of ascending colon: Secondary | ICD-10-CM

## 2020-06-19 DIAGNOSIS — I700 Atherosclerosis of aorta: Secondary | ICD-10-CM | POA: Diagnosis not present

## 2020-06-19 DIAGNOSIS — K449 Diaphragmatic hernia without obstruction or gangrene: Secondary | ICD-10-CM | POA: Diagnosis not present

## 2020-06-19 IMAGING — CT CT ABD-PELV W/ CM
2 of 5 series · 15 of 46 positions shown, 17 images · IV contrast (omnipaque)
Comparison: [DATE] PET.  [DATE] CT

CLINICAL DATA: Restaging of colon cancer diagnosed [DATE].
Colon resection. Appendectomy. On chemotherapy. Cecal primary.

EXAM:
CT ABDOMEN AND PELVIS WITH CONTRAST
TECHNIQUE: Multidetector CT imaging of the abdomen and pelvis was performed
using the standard protocol following bolus administration of
intravenous contrast.
CONTRAST:  100mL OMNIPAQUE IOHEXOL 300 MG/ML  SOLN

[Series 2: abd pelvis 5.00 · axial · 0.76mm/px · z∈[-1567,-1137]mm · 12 of 98 slices shown, 14 images]
[im 6/98  soft-tissue]
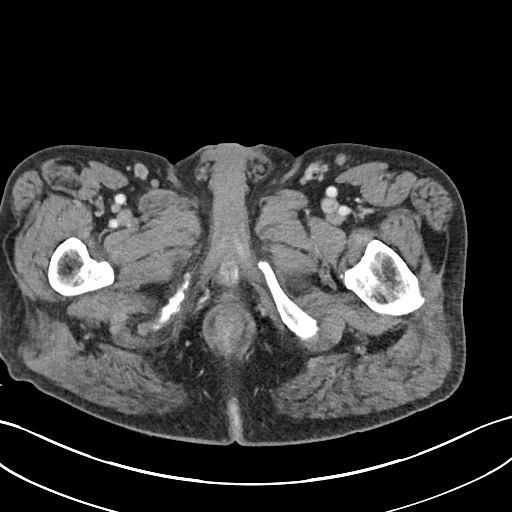
[im 6/98  bone]
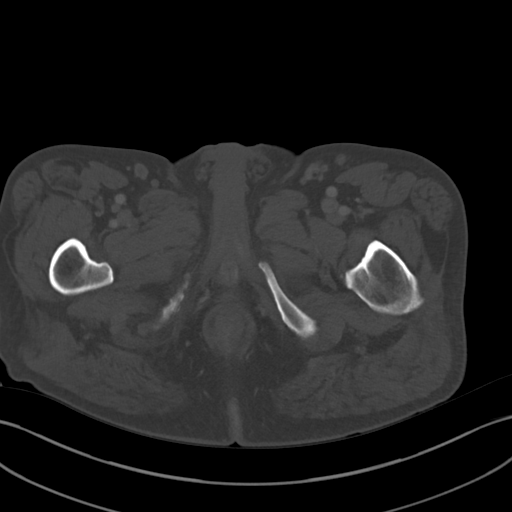
[im 18/98  soft-tissue]
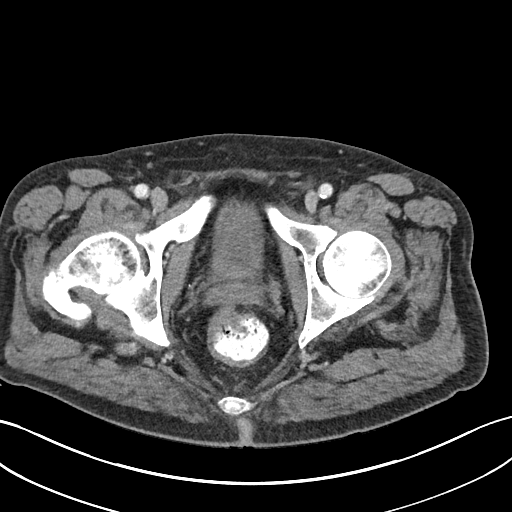
[im 23/98  soft-tissue]
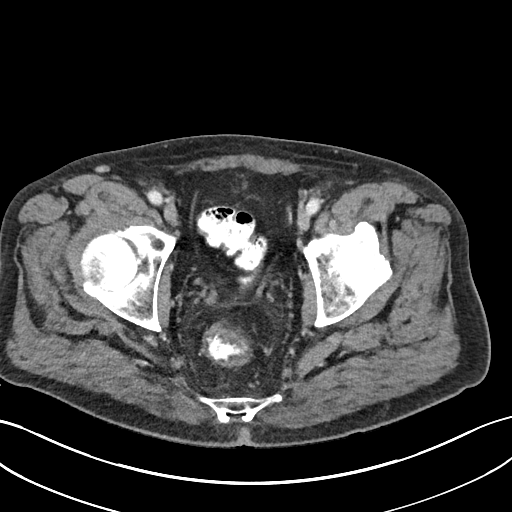
[im 29/98  soft-tissue]
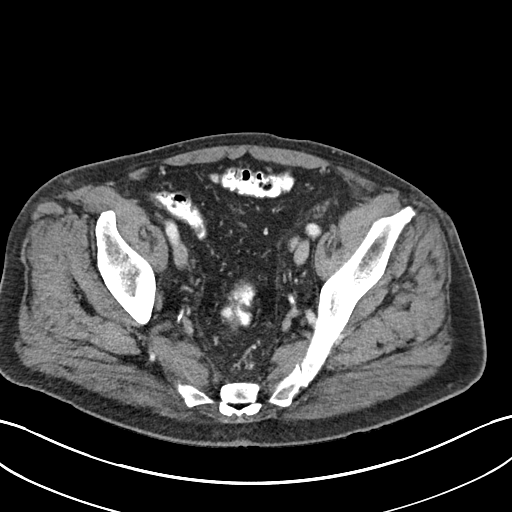
[im 40/98  soft-tissue]
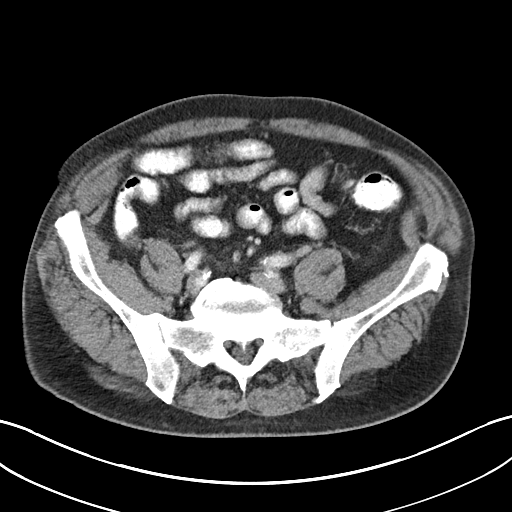
[im 46/98  soft-tissue]
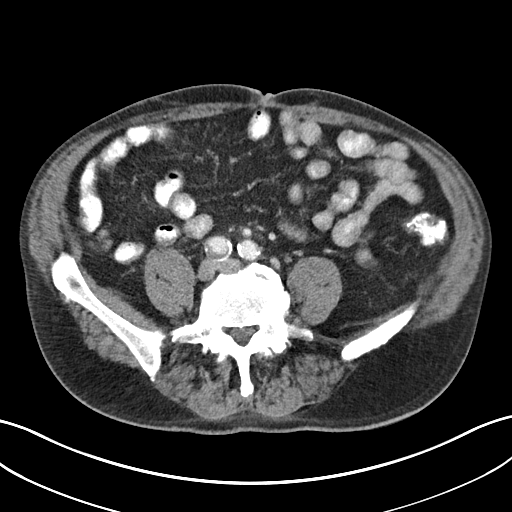
[im 52/98  soft-tissue]
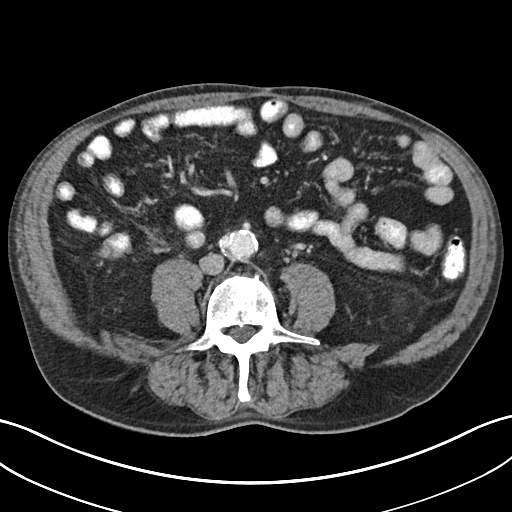
[im 63/98  soft-tissue]
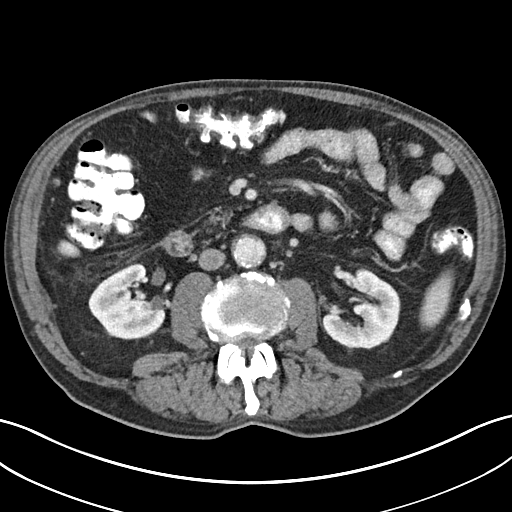
[im 69/98  soft-tissue]
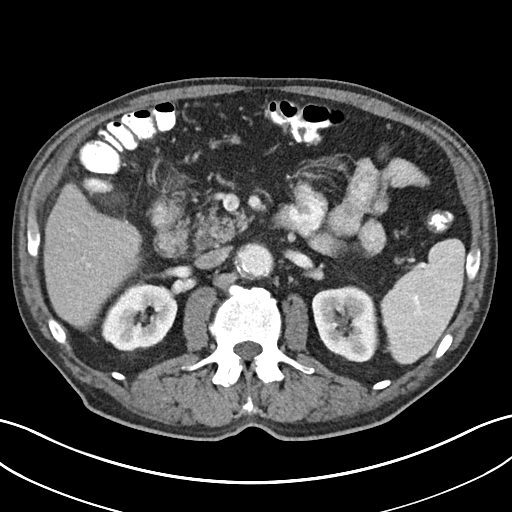
[im 69/98  bone]
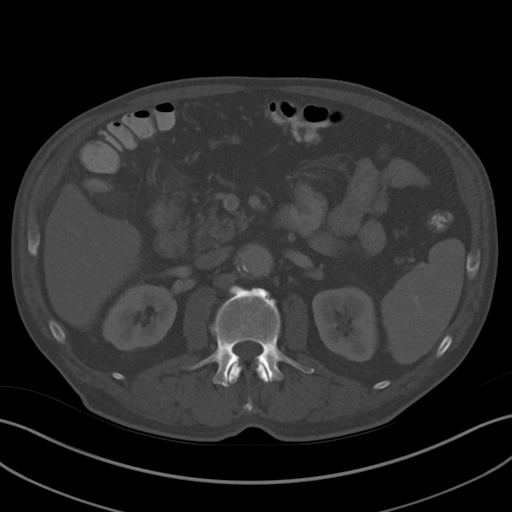
[im 75/98  soft-tissue]
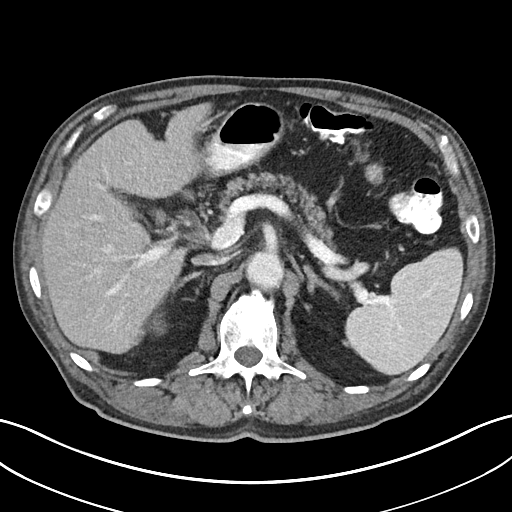
[im 86/98  soft-tissue]
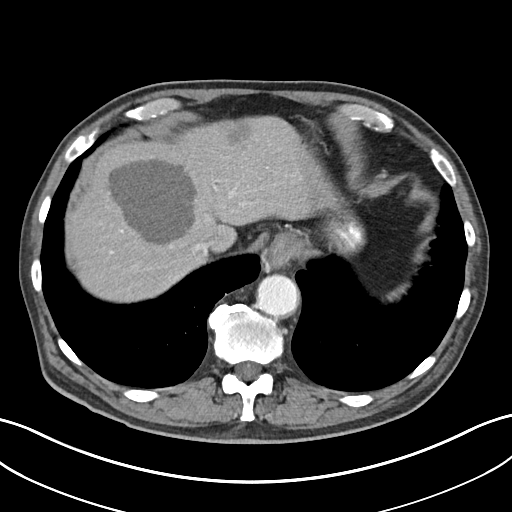
[im 92/98  soft-tissue]
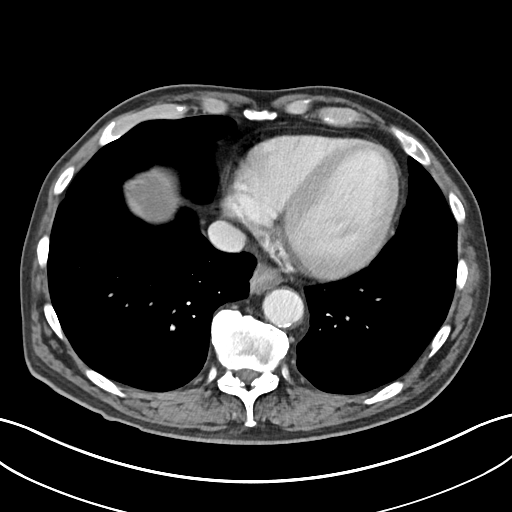

[Series 4: coronals abd pelvis 2.00 cor · coronal · 0.76mm/px · 3 of 143 slices shown]
[im 48/143  soft-tissue]
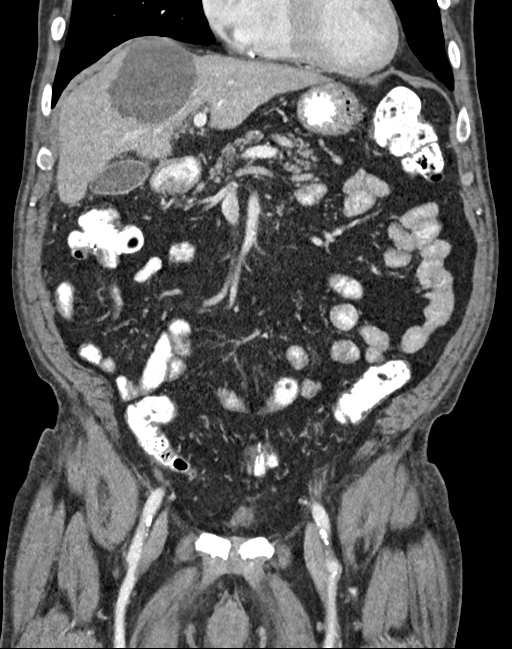
[im 64/143  soft-tissue]
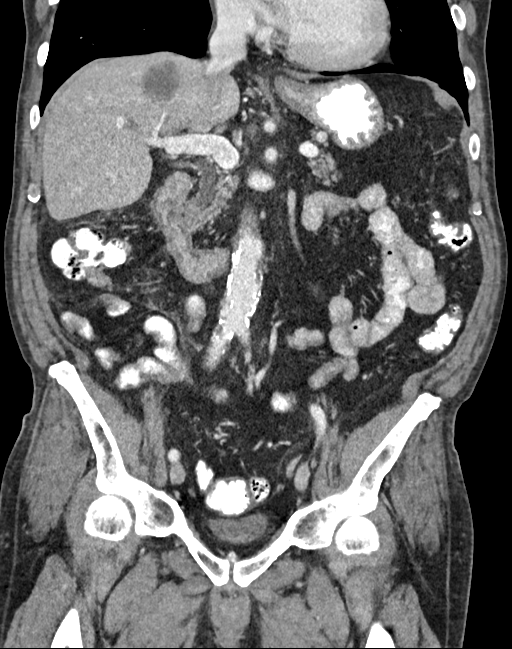
[im 79/143  soft-tissue]
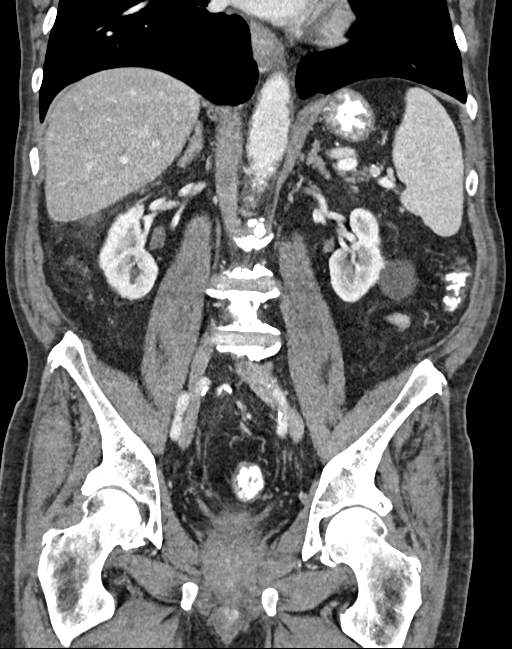

[15 of 46 positions shown; findings below may reference images not displayed]

FINDINGS: Lower chest: Left lower lobe pulmonary nodule of 8 mm on [DATE],
enlarged from 4 mm on prior CT and PET. Normal heart size without
pericardial or pleural effusion. Right coronary artery
atherosclerosis. Ulcerative plaque in the descending thoracic aorta.

Hepatobiliary: Right hepatic lobe 1.0 cm lesion on [DATE] is similar
to slightly more conspicuous than on the prior diagnostic CT.

Segment 4A dominant hepatic cyst.

Segment 2 metastasis measures 3.9 x 3.6 cm on [DATE] versus 3.7 x
cm on [DATE] (when remeasured). 3.4 x 3.0 cm on the [DATE]
PET. Adjacent nodule along the left hepatic capsule measures 6 mm on
[DATE] versus 4 mm on [DATE] (when remeasured).

Subtle gallstones are less apparent today than on the prior exams.
Common duct caliber of up to 1.0 cm on [DATE] is felt to be similar to
on the prior diagnostic CT. No well-defined choledocholithiasis.

Pancreas: Mild pancreatic atrophy. No duct dilatation or acute
inflammation.

Spleen: Normal in size, without focal abnormality.

Adrenals/Urinary Tract: Normal adrenal glands. 4.2 cm left renal
cyst. Left renal collecting system calculi of up to 3 mm. Normal
right kidney. No hydronephrosis. Decompressed urinary bladder

Stomach/Bowel: Portions of the stomach are underdistended.

Right hemicolectomy. An implant adjacent to surgical sutures
measures 1.9 x 1.8 cm on 45/2 versus 1.5 x 1.2 cm on [DATE] and
1.7 x 1.4 cm on [DATE]. Normal small bowel.

Vascular/Lymphatic: Aortic atherosclerosis. No abdominopelvic
adenopathy.

Reproductive: Normal prostate.

Other: No significant free fluid. Minimal nodularity anterior to the
hepatic flexure of the colon on 36/2 is felt to be similar,
nonspecific. No well-defined omental/peritoneal metastasis.

Musculoskeletal: Partial degenerative fusion of the right sacroiliac
joint. Lumbosacral spondylosis.
IMPRESSION: 1. Significant enlargement of left lower lobe pulmonary nodule
consistent with progressive metastasis.
2. Mild progression of hepatic metastasis, including a dominant
lesion within segment 2 and an adjacent area of capsular nodularity.
3. Mild enlargement of a anastomotic implant in the setting of right
hemicolectomy.
4. Coronary artery atherosclerosis. Aortic Atherosclerosis
([YL]-[YL]).
5.  Tiny hiatal hernia.
6. Left nephrolithiasis
7. Cholelithiasis

## 2020-06-19 MED ORDER — IOHEXOL 300 MG/ML  SOLN
100.0000 mL | Freq: Once | INTRAMUSCULAR | Status: AC | PRN
  Administered 2020-06-19: 100 mL via INTRAVENOUS

## 2020-06-23 DIAGNOSIS — C182 Malignant neoplasm of ascending colon: Secondary | ICD-10-CM | POA: Diagnosis not present

## 2020-06-25 ENCOUNTER — Inpatient Hospital Stay: Payer: Medicare HMO | Attending: Internal Medicine

## 2020-06-25 ENCOUNTER — Other Ambulatory Visit: Payer: Self-pay

## 2020-06-25 ENCOUNTER — Inpatient Hospital Stay: Payer: Medicare HMO | Admitting: Internal Medicine

## 2020-06-25 ENCOUNTER — Inpatient Hospital Stay: Payer: Medicare HMO

## 2020-06-25 VITALS — BP 185/86 | HR 77 | Resp 16

## 2020-06-25 DIAGNOSIS — Z5112 Encounter for antineoplastic immunotherapy: Secondary | ICD-10-CM | POA: Insufficient documentation

## 2020-06-25 DIAGNOSIS — C182 Malignant neoplasm of ascending colon: Secondary | ICD-10-CM | POA: Diagnosis not present

## 2020-06-25 DIAGNOSIS — Z5111 Encounter for antineoplastic chemotherapy: Secondary | ICD-10-CM | POA: Insufficient documentation

## 2020-06-25 DIAGNOSIS — Z7189 Other specified counseling: Secondary | ICD-10-CM

## 2020-06-25 DIAGNOSIS — C787 Secondary malignant neoplasm of liver and intrahepatic bile duct: Secondary | ICD-10-CM | POA: Diagnosis not present

## 2020-06-25 LAB — COMPREHENSIVE METABOLIC PANEL
ALT: 20 U/L (ref 0–44)
AST: 32 U/L (ref 15–41)
Albumin: 3.2 g/dL — ABNORMAL LOW (ref 3.5–5.0)
Alkaline Phosphatase: 121 U/L (ref 38–126)
Anion gap: 6 (ref 5–15)
BUN: 10 mg/dL (ref 8–23)
CO2: 24 mmol/L (ref 22–32)
Calcium: 8 mg/dL — ABNORMAL LOW (ref 8.9–10.3)
Chloride: 108 mmol/L (ref 98–111)
Creatinine, Ser: 0.8 mg/dL (ref 0.61–1.24)
GFR calc Af Amer: >60 mL/min (ref >60)
GFR calc non Af Amer: >60 mL/min (ref >60)
Glucose, Bld: 201 mg/dL — ABNORMAL HIGH (ref 70–99)
Potassium: 3.6 mmol/L (ref 3.5–5.1)
Sodium: 138 mmol/L (ref 135–145)
Total Bilirubin: 0.8 mg/dL (ref 0.3–1.2)
Total Protein: 6.5 g/dL (ref 6.5–8.1)

## 2020-06-25 LAB — URINALYSIS, COMPLETE (UACMP) WITH MICROSCOPIC
Bacteria, UA: NONE SEEN
Bilirubin Urine: NEGATIVE
Glucose, UA: >=500 mg/dL — AB
Hgb urine dipstick: NEGATIVE
Ketones, ur: NEGATIVE mg/dL
Leukocytes,Ua: NEGATIVE
Nitrite: NEGATIVE
Protein, ur: NEGATIVE mg/dL
Specific Gravity, Urine: 1.025 (ref 1.005–1.030)
pH: 5 (ref 5.0–8.0)

## 2020-06-25 LAB — CBC WITH DIFFERENTIAL/PLATELET
Abs Immature Granulocytes: 0.01 10*3/uL (ref 0.00–0.07)
Basophils Absolute: 0 10*3/uL (ref 0.0–0.1)
Basophils Relative: 1 %
Eosinophils Absolute: 0.4 10*3/uL (ref 0.0–0.5)
Eosinophils Relative: 10 %
HCT: 36.7 % — ABNORMAL LOW (ref 39.0–52.0)
Hemoglobin: 12.8 g/dL — ABNORMAL LOW (ref 13.0–17.0)
Immature Granulocytes: 0 %
Lymphocytes Relative: 33 %
Lymphs Abs: 1.2 10*3/uL (ref 0.7–4.0)
MCH: 34.7 pg — ABNORMAL HIGH (ref 26.0–34.0)
MCHC: 34.9 g/dL (ref 30.0–36.0)
MCV: 99.5 fL (ref 80.0–100.0)
Monocytes Absolute: 0.4 10*3/uL (ref 0.1–1.0)
Monocytes Relative: 12 %
Neutro Abs: 1.6 10*3/uL — ABNORMAL LOW (ref 1.7–7.7)
Neutrophils Relative %: 44 %
Platelets: 104 10*3/uL — ABNORMAL LOW (ref 150–400)
RBC: 3.69 MIL/uL — ABNORMAL LOW (ref 4.22–5.81)
RDW: 14.5 % (ref 11.5–15.5)
WBC: 3.7 10*3/uL — ABNORMAL LOW (ref 4.0–10.5)
nRBC: 0 % (ref 0.0–0.2)

## 2020-06-25 MED ORDER — SODIUM CHLORIDE 0.9 % IV SOLN
2369.0000 mg/m2 | INTRAVENOUS | Status: DC
  Administered 2020-06-25: 5000 mg via INTRAVENOUS
  Filled 2020-06-25: qty 100

## 2020-06-25 MED ORDER — SODIUM CHLORIDE 0.9 % IV SOLN
Freq: Once | INTRAVENOUS | Status: AC
  Filled 2020-06-25: qty 250

## 2020-06-25 MED ORDER — SODIUM CHLORIDE 0.9 % IV SOLN
5.0000 mg/kg | Freq: Once | INTRAVENOUS | Status: AC
  Administered 2020-06-25: 400 mg via INTRAVENOUS
  Filled 2020-06-25: qty 16

## 2020-06-25 MED ORDER — SODIUM CHLORIDE 0.9% FLUSH
10.0000 mL | Freq: Once | INTRAVENOUS | Status: AC
  Administered 2020-06-25: 10 mL via INTRAVENOUS
  Filled 2020-06-25: qty 10

## 2020-06-25 MED ORDER — OXALIPLATIN CHEMO INJECTION 100 MG/20ML
178.0000 mg | Freq: Once | INTRAVENOUS | Status: AC
  Administered 2020-06-25: 180 mg via INTRAVENOUS
  Filled 2020-06-25: qty 36

## 2020-06-25 MED ORDER — DEXAMETHASONE SODIUM PHOSPHATE 10 MG/ML IJ SOLN
10.0000 mg | Freq: Once | INTRAMUSCULAR | Status: AC
  Administered 2020-06-25: 10 mg via INTRAVENOUS
  Filled 2020-06-25: qty 1

## 2020-06-25 MED ORDER — HEPARIN SOD (PORK) LOCK FLUSH 100 UNIT/ML IV SOLN
500.0000 [IU] | Freq: Once | INTRAVENOUS | Status: DC
  Filled 2020-06-25: qty 5

## 2020-06-25 MED ORDER — PALONOSETRON HCL INJECTION 0.25 MG/5ML
0.2500 mg | Freq: Once | INTRAVENOUS | Status: AC
  Administered 2020-06-25: 0.25 mg via INTRAVENOUS
  Filled 2020-06-25: qty 5

## 2020-06-25 MED ORDER — DEXTROSE 5 % IV SOLN
Freq: Once | INTRAVENOUS | Status: AC
  Filled 2020-06-25: qty 250

## 2020-06-25 MED ORDER — LEUCOVORIN CALCIUM INJECTION 350 MG
379.0000 mg/m2 | Freq: Once | INTRAVENOUS | Status: AC
  Administered 2020-06-25: 800 mg via INTRAVENOUS
  Filled 2020-06-25: qty 40

## 2020-06-25 NOTE — Assessment & Plan Note (Addendum)
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  Currently on FOLFOX plus Avastin; CT scan SEP 4th 2021- Progressive disease-left lower lobe lung nodule 8 mm [previously 4 mm]; increase in size of the hepatic metastases by few millimeters.;  Soft tissue nodule adjacent to anastomotic site again increased by few millimeters.  Given the overall progression noted on imaging-would recommend switching chemotherapy- see below.   #Proceed with today- of FOLFOX plus Avastin chemotherapy.  Labs today reviewed;  acceptable for treatment today-except for mild thrombocytopenia platelets -122. Will plan swicthing to FOLFIRI at next visit.   # PN- G-1-2; sec to oxaliplatin.  STABLE.   # HTN- systolic 621H- STABLE;  recommend monitoring at home.    # DISPOSITION:  #FOLFOX+ Avastin today; pump off in 2 days.  # follow up in 2 weeks/ [9/20] MD; labs- cbc/cmp/UA/ CEA; FOLFIRI [new]+ avastin;pump off in 2 days -Dr.B  # I reviewed the blood work- with the patient in detail; also reviewed the imaging independently [as summarized above]; and with the patient in detail.

## 2020-06-25 NOTE — Progress Notes (Signed)
DISCONTINUE ON PATHWAY REGIMEN - Colorectal     A cycle is every 14 days:     Bevacizumab-xxxx      Oxaliplatin      Leucovorin      Fluorouracil      Fluorouracil   **Always confirm dose/schedule in your pharmacy ordering system**  REASON: Disease Progression PRIOR TREATMENT: [id number]: mFOLFOX6 + Bevacizumab q14 Days TREATMENT RESPONSE: Progressive Disease (PD)  START ON PATHWAY REGIMEN - Colorectal     A cycle is every 14 days:     Bevacizumab-xxxx      Irinotecan      Leucovorin      Fluorouracil      Fluorouracil   **Always confirm dose/schedule in your pharmacy ordering system**  Patient Characteristics: Distant Metastases, Nonsurgical Candidate, KRAS/NRAS Mutation Positive/Unknown (BRAF V600 Wild-Type/Unknown), Standard Cytotoxic Therapy, Second Line Standard Cytotoxic Therapy, Bevacizumab Eligible Tumor Location: Colon Therapeutic Status: Distant Metastases Microsatellite/Mismatch Repair Status: MSS/pMMR BRAF Mutation Status: Wild-Type (no mutation) KRAS/NRAS Mutation Status: Mutation Positive Standard Cytotoxic Line of Therapy: Second Line Standard Cytotoxic Therapy Bevacizumab Eligibility: Eligible Intent of Therapy: Non-Curative / Palliative Intent, Discussed with Patient 

## 2020-06-25 NOTE — Progress Notes (Signed)
Patient's BP 185/86 HR 77 post treatment. Denies any s/s at this time. Dr. Rogue Bussing and team made aware. Educated patient on s/s as to when to seek emergency care. Patient verbalizes understanding and denies any further questions or concerns.

## 2020-06-25 NOTE — Progress Notes (Signed)
[name] NOTE  Patient Care Team: Sofie Hartigan, MD as PCP - General (Family Medicine) [name], MD as Consulting Physician (Hematology and Oncology)  CHIEF COMPLAINTS/PURPOSE OF CONSULTATION: Colon cancer  #  Oncology History Overview Note  # MAY 2020- 3. 03/09/19 Liver biopsy. Microscopic examination shows malignant cells with glandular architecture consistent with adenocarcinoma. The malignant cells are positive for CK20 and CDX-2. These findings support the clinical impression of metastatic colon adenocarcinoma. 4. 03/10/19 R hemicolectomy. Tumor site cecum. Adenocarcinoma. Mucinous features present. G2. No tumor deposits. Invades visceral peritoneum. No tumor perforation. LVI present. PNI not identified. All margins uninvolved. 1/12 LNs. PT4apN1. Periappendiceal inflammation c/w resolving abscess. Microsatellite stable (MSS). [Dr.Mettu; [facility]]  # SEP 4th 2020 [compared to May 2020]  Interval increase in size of the metastases to the hepatic dome, The metastasis to the left hepatic lobe is unchanged; 2.  New subcentimeter hypoattenuating lesion in the inferior right hepatic lobe, incompletely characterized on CT. 3.  Postsurgical changes following right hemicolectomy.  # OCT 2020- FOLFOX +avastin; CT dec 22nd 2020- [compared to [facility] sep 9th 2020]-Liver- slight progression versus stable disease; CT scan SEP 4th 2021- Progressive disease-left lower lobe lung nodule 8 mm [previously 4 mm]; increase in size of the hepatic metastases by few millimeters.;  Soft tissue nodule adjacent to anastomotic site again increased by few millimeters.STOP FOLFOX; cont avastin  #  START FOLFIRI+ AVASTIN  # NGS/F-ONE-MUTATED K-RAS [G]  # PALLIATIVE CARE EVALUATION: 09/20/2019-Oscar [name]  # PAIN MANAGEMENT: NA   DIAGNOSIS: COLON CANCER  STAGE:  IV     ;  GOALS:Palliative  CURRENT/MOST RECENT THERAPY : FOLFOX+ avastin [C]    Cancer of right colon ([place])  07/05/2019 Initial Diagnosis    Cancer of right colon ([place])   07/24/2019 - 06/25/2020 Chemotherapy   The patient had dexamethasone (DECADRON) 4 MG tablet, 8 mg, Oral, Daily, 1 of 1 cycle, Start date: --, End date: -- palonosetron (ALOXI) injection 0.25 mg, 0.25 mg, Intravenous,  Once, 23 of 25 cycles Administration: 0.25 mg (07/24/2019), 0.25 mg (08/07/2019), 0.25 mg (08/21/2019), 0.25 mg (09/04/2019), 0.25 mg (09/20/2019), 0.25 mg (10/04/2019), 0.25 mg (10/16/2019), 0.25 mg (10/30/2019), 0.25 mg (11/22/2019), 0.25 mg (12/06/2019), 0.25 mg (12/20/2019), 0.25 mg (01/03/2020), 0.25 mg (01/29/2020), 0.25 mg (02/12/2020), 0.25 mg (02/26/2020), 0.25 mg (03/11/2020), 0.25 mg (03/25/2020), 0.25 mg (04/08/2020), 0.25 mg (04/24/2020), 0.25 mg (05/08/2020), 0.25 mg (05/22/2020), 0.25 mg (06/10/2020), 0.25 mg (06/25/2020) leucovorin 800 mg in dextrose 5 % 250 mL infusion, 844 mg, Intravenous,  Once, 23 of 25 cycles Administration: 800 mg (07/24/2019), 800 mg (08/07/2019), 800 mg (08/21/2019), 800 mg (09/04/2019), 800 mg (09/20/2019), 800 mg (10/04/2019), 800 mg (10/16/2019), 800 mg (10/30/2019), 800 mg (11/22/2019), 800 mg (12/06/2019), 800 mg (12/20/2019), 800 mg (01/03/2020), 800 mg (01/29/2020), 800 mg (02/12/2020), 800 mg (02/26/2020), 800 mg (03/11/2020), 800 mg (03/25/2020), 800 mg (04/08/2020), 800 mg (04/24/2020), 800 mg (05/08/2020), 800 mg (05/22/2020), 800 mg (06/10/2020), 800 mg (06/25/2020) oxaliplatin (ELOXATIN) 180 mg in dextrose 5 % 500 mL chemo infusion, 85 mg/m2 = 180 mg, Intravenous,  Once, 23 of 25 cycles Dose modification: 178 mg (original dose 85 mg/m2, Cycle 17, Reason: Other (see comments), Comment: insurance adjusted dose ) Administration: 180 mg (07/24/2019), 180 mg (08/07/2019), 180 mg (08/21/2019), 180 mg (09/04/2019), 180 mg (09/20/2019), 180 mg (10/04/2019), 180 mg (10/16/2019), 180 mg (10/30/2019), 180 mg (11/22/2019), 180 mg (12/06/2019), 180 mg (12/20/2019), 180 mg (01/03/2020), 180 mg (01/29/2020), 180 mg (02/12/2020), 180 mg (02/26/2020), 180 mg (03/11/2020),  180 mg  (04/08/2020), 180 mg (04/24/2020), 180 mg (05/08/2020), 180 mg (05/22/2020), 180 mg (06/10/2020), 180 mg (06/25/2020) fluorouracil (ADRUCIL) 5,000 mg in sodium chloride 0.9 % 150 mL chemo infusion, 5,050 mg, Intravenous, 1 Day/Dose, 23 of 25 cycles Administration: 5,000 mg (07/24/2019), 5,000 mg (08/07/2019), 5,000 mg (08/21/2019), 5,050 mg (09/04/2019), 5,000 mg (10/04/2019), 5,000 mg (10/16/2019), 5,000 mg (11/22/2019), 5,000 mg (12/06/2019), 5,000 mg (12/20/2019), 5,000 mg (01/03/2020), 5,000 mg (01/29/2020), 5,000 mg (02/12/2020), 5,000 mg (02/26/2020), 5,000 mg (03/11/2020), 5,000 mg (03/25/2020), 5,000 mg (04/08/2020), 5,000 mg (04/24/2020), 5,000 mg (05/08/2020), 5,000 mg (05/22/2020), 5,000 mg (06/10/2020), 5,000 mg (06/25/2020) bevacizumab-bvzr (ZIRABEV) 400 mg in sodium chloride 0.9 % 100 mL chemo infusion, 5 mg/kg = 400 mg, Intravenous,  Once, 23 of 25 cycles Administration: 400 mg (08/07/2019), 400 mg (08/21/2019), 400 mg (09/04/2019), 400 mg (09/20/2019), 400 mg (10/04/2019), 400 mg (10/16/2019), 400 mg (10/30/2019), 400 mg (11/22/2019), 400 mg (12/06/2019), 400 mg (12/20/2019), 400 mg (01/03/2020), 400 mg (01/29/2020), 400 mg (02/12/2020), 400 mg (02/26/2020), 400 mg (03/11/2020), 400 mg (03/25/2020), 400 mg (04/08/2020), 400 mg (04/24/2020), 400 mg (05/08/2020), 400 mg (05/22/2020), 400 mg (06/10/2020), 400 mg (06/25/2020)  for chemotherapy treatment.    06/25/2020 -  Chemotherapy   The patient had dexamethasone (DECADRON) 4 MG tablet, 8 mg, Oral, Daily, 0 of 1 cycle, Start date: --, End date: -- palonosetron (ALOXI) injection 0.25 mg, 0.25 mg, Intravenous,  Once, 0 of 8 cycles irinotecan (CAMPTOSAR) 380 mg in sodium chloride 0.9 % 500 mL chemo infusion, 180 mg/m2, Intravenous,  Once, 0 of 8 cycles fluorouracil (ADRUCIL) 5,050 mg in sodium chloride 0.9 % 149 mL chemo infusion, 2,400 mg/m2 = 5,050 mg, Intravenous, 1 Day/Dose, 0 of 8 cycles bevacizumab-bvzr (ZIRABEV) 400 mg in sodium chloride 0.9 % 100 mL chemo infusion, 5 mg/kg, Intravenous,   Once, 0 of 8 cycles  for chemotherapy treatment.      HISTORY OF PRESENTING ILLNESS:  Oscar [name] 79 y.o.  male with metastatic colon cancer to the liver currently on FOLFOX plus Avastin is here for follow-up/review results of the CT scan.  Patient denies any nausea vomiting.  Appetite is fair.  No weight loss.  Continues to have mild tingling and numbness in his extremities.  Not any worse.  Continues to have shoulder pain neck pain not any worse.  Review of Systems  Constitutional: Positive for malaise/fatigue. Negative for chills, diaphoresis, fever and weight loss.  HENT: Negative for nosebleeds and sore throat.   Eyes: Negative for double vision.  Respiratory: Negative for cough, hemoptysis, sputum production, shortness of breath and wheezing.   Cardiovascular: Negative for chest pain, palpitations, orthopnea and leg swelling.  Gastrointestinal: Negative for abdominal pain, blood in stool, constipation, diarrhea, heartburn, melena, nausea and vomiting.  Genitourinary: Negative for dysuria, frequency and urgency.  Musculoskeletal: Positive for back pain and joint pain.  Skin: Negative.  Negative for itching and rash.  Neurological: Positive for tingling. Negative for dizziness, focal weakness, weakness and headaches.  Endo/Heme/Allergies: Does not bruise/bleed easily.  Psychiatric/Behavioral: Negative for depression. The patient is not nervous/anxious and does not have insomnia.      MEDICAL HISTORY:  Past Medical History:  Diagnosis Date  . Arthritis   . Cancer ([place])    colon cancer 02/2019 per pt   . Diabetes mellitus without complication ([place])   . H/O colon cancer, stage IV   . Hyperlipemia   . Hypertension     SURGICAL HISTORY: Past Surgical History:  Procedure Laterality Date  .  IR IMAGING GUIDED PORT INSERTION  07/20/2019  . JOINT REPLACEMENT      SOCIAL HISTORY: Social History   Socioeconomic History  . Marital status: Married    Spouse name: Not on file   . Number of children: Not on file  . Years of education: Not on file  . Highest education level: Not on file  Occupational History  . Not on file  Tobacco Use  . Smoking status: Current Every Day Smoker    Packs/day: 0.25    Types: Cigarettes  . Smokeless tobacco: Never Used  . Tobacco comment: 1 to 2 cigarettes a day occasionally  Vaping Use  . Vaping Use: Never used  Substance and Sexual Activity  . Alcohol use: No  . Drug use: No  . Sexual activity: Not on file  Other Topics Concern  . Not on file  Social History Narrative   Recruitment consultant retd; lives in [place]; smoking 3cig/day; [3/4 ppd x started at 9 years]; no alcohol. Son & daughter; wife dementia [waiting for placement].    Social Determinants of Health   Financial Resource Strain:   . Difficulty of Paying Living Expenses: Not on file  Food Insecurity:   . Worried About Charity fundraiser in the Last Year: Not on file  . Ran Out of Food in the Last Year: Not on file  Transportation Needs:   . Lack of Transportation (Medical): Not on file  . Lack of Transportation (Non-Medical): Not on file  Physical Activity:   . Days of Exercise per Week: Not on file  . Minutes of Exercise per Session: Not on file  Stress:   . Feeling of Stress : Not on file  Social Connections:   . Frequency of Communication with Friends and Family: Not on file  . Frequency of Social Gatherings with Friends and Family: Not on file  . Attends Religious Services: Not on file  . Active Member of Clubs or Organizations: Not on file  . Attends Archivist Meetings: Not on file  . Marital Status: Not on file  Intimate Partner Violence:   . Fear of Current or Ex-Partner: Not on file  . Emotionally Abused: Not on file  . Physically Abused: Not on file  . Sexually Abused: Not on file    FAMILY HISTORY: Family History  Problem Relation Age of Onset  . Peptic Ulcer Disease Father     ALLERGIES:  is allergic to ace  inhibitors.  MEDICATIONS:  Current Outpatient Medications  Medication Sig Dispense Refill  . amLODipine (NORVASC) 5 MG tablet Take 5 mg by mouth daily.    . fluticasone (FLONASE) 50 MCG/ACT nasal spray Place 1 spray into both nostrils daily as needed.     . meloxicam (MOBIC) 15 MG tablet Take 1 tablet (15 mg total) by mouth daily. 90 tablet 0  . pravastatin (PRAVACHOL) 40 MG tablet Take 40 mg by mouth daily.    . tamsulosin (FLOMAX) 0.4 MG CAPS capsule Take 1 capsule (0.4 mg total) by mouth daily. 30 capsule 11  . lidocaine-prilocaine (EMLA) cream Apply 1 application topically as needed. (Patient not taking: Reported on 12/05/2019) 30 g 0  . montelukast (SINGULAIR) 10 MG tablet TAKE 1 TABLET BY MOUTH EVERYDAY AT BEDTIME (Patient not taking: Reported on 03/25/2020) 90 tablet 1  . ondansetron (ZOFRAN-ODT) 4 MG disintegrating tablet Take 1 tablet (4 mg total) by mouth every 6 (six) hours as needed for nausea. (Patient not taking: Reported on 12/05/2019) 20 tablet  0  . prochlorperazine (COMPAZINE) 10 MG tablet Take 1 tablet (10 mg total) by mouth every 6 (six) hours as needed for nausea or vomiting. (Patient not taking: Reported on 12/05/2019) 40 tablet 1   No current facility-administered medications for this visit.   Facility-Administered Medications Ordered in Other Visits  Medication Dose Route Frequency Provider Last Rate Last Admin  . heparin lock flush 100 unit/mL  500 Units Intravenous Once Cammie Sickle, MD          .  PHYSICAL EXAMINATION: ECOG PERFORMANCE STATUS: 0 - Asymptomatic  Vitals:   06/25/20 0909  BP: (!) 142/69  Pulse: 67  Resp: 16  Temp: (!) 97.5 F (36.4 C)  SpO2: 100%   Filed Weights   06/25/20 0909  Weight: 193 lb (87.5 kg)    Physical Exam HENT:     Head: Normocephalic and atraumatic.     Mouth/Throat:     Pharynx: No oropharyngeal exudate.  Eyes:     Pupils: Pupils are equal, round, and reactive to light.  Cardiovascular:     Rate and Rhythm:  Normal rate and regular rhythm.  Pulmonary:     Effort: No respiratory distress.     Breath sounds: No wheezing.  Abdominal:     General: Bowel sounds are normal. There is no distension.     Palpations: Abdomen is soft. There is no mass.     Tenderness: There is no abdominal tenderness. There is no guarding or rebound.  Musculoskeletal:        General: No tenderness. Normal range of motion.     Cervical back: Normal range of motion and neck supple.  Skin:    General: Skin is warm.  Neurological:     Mental Status: He is alert and oriented to person, place, and time.  Psychiatric:        Mood and Affect: Affect normal.    LABORATORY DATA:  I have reviewed the data as listed Lab Results  Component Value Date   WBC 3.7 (L) 06/25/2020   HGB 12.8 (L) 06/25/2020   HCT 36.7 (L) 06/25/2020   MCV 99.5 06/25/2020   PLT 104 (L) 06/25/2020   Recent Labs    05/22/20 0924 06/10/20 0858 06/25/20 0841  NA 138 138 138  K 3.8 3.7 3.6  CL 107 107 108  CO2 _0 GLUCOSE 215* 205* 201*  BUN _1 CREATININE 0.73 0.89 0.80  CALCIUM 8.2* 8.2* 8.0*  GFRNONAA >60 >60 >60  GFRAA >60 >60 >60  PROT 6.6 6.6 6.5  ALBUMIN 3.3* 3.2* 3.2*  AST 37 35 32  ALT _2 ALKPHOS 116 112 121  BILITOT 0.9 0.6 0.8    RADIOGRAPHIC STUDIES: I have personally reviewed the radiological images as listed and agreed with the findings in the report. CT Abdomen Pelvis W Contrast  Result Date: 06/19/2020 CLINICAL DATA:  Restaging of colon cancer diagnosed March of 2020. Colon resection. Appendectomy. On chemotherapy. Cecal primary. EXAM: CT ABDOMEN AND PELVIS WITH CONTRAST TECHNIQUE: Multidetector CT imaging of the abdomen and pelvis was performed using the standard protocol following bolus administration of intravenous contrast. CONTRAST:  115m OMNIPAQUE IOHEXOL 300 MG/ML  SOLN COMPARISON:  03/20/2020 PET.  01/12/2020 CT FINDINGS: Lower chest: Left lower lobe pulmonary nodule of 8 mm on 08/03,  enlarged from 4 mm on prior CT and PET. Normal heart size without pericardial or pleural effusion. Right coronary artery atherosclerosis. Ulcerative plaque in the descending thoracic  aorta. Hepatobiliary: Right hepatic lobe 1.0 cm lesion on 29/2 is similar to slightly more conspicuous than on the prior diagnostic CT. Segment 4A dominant hepatic cyst. Segment 2 metastasis measures 3.9 x 3.6 cm on 16/2 versus 3.7 x 3.6 cm on 01/12/2020 (when remeasured). 3.4 x 3.0 cm on the 03/20/2020 PET. Adjacent nodule along the left hepatic capsule measures 6 mm on 15/2 versus 4 mm on 01/12/2020 (when remeasured). Subtle gallstones are less apparent today than on the prior exams. Common duct caliber of up to 1.0 cm on 26/2 is felt to be similar to on the prior diagnostic CT. No well-defined choledocholithiasis. Pancreas: Mild pancreatic atrophy. No duct dilatation or acute inflammation. Spleen: Normal in size, without focal abnormality. Adrenals/Urinary Tract: Normal adrenal glands. 4.2 cm left renal cyst. Left renal collecting system calculi of up to 3 mm. Normal right kidney. No hydronephrosis. Decompressed urinary bladder Stomach/Bowel: Portions of the stomach are underdistended. Right hemicolectomy. An implant adjacent to surgical sutures measures 1.9 x 1.8 cm on 45/2 versus 1.5 x 1.2 cm on 01/12/2020 and 1.7 x 1.4 cm on 03/20/2020. Normal small bowel. Vascular/Lymphatic: Aortic atherosclerosis. No abdominopelvic adenopathy. Reproductive: Normal prostate. Other: No significant free fluid. Minimal nodularity anterior to the hepatic flexure of the colon on 36/2 is felt to be similar, nonspecific. No well-defined omental/peritoneal metastasis. Musculoskeletal: Partial degenerative fusion of the right sacroiliac joint. Lumbosacral spondylosis. IMPRESSION: 1. Significant enlargement of left lower lobe pulmonary nodule consistent with progressive metastasis. 2. Mild progression of hepatic metastasis, including a dominant lesion  within segment 2 and an adjacent area of capsular nodularity. 3. Mild enlargement of a anastomotic implant in the setting of right hemicolectomy. 4. Coronary artery atherosclerosis. Aortic Atherosclerosis (ICD10-I70.0). 5.  Tiny hiatal hernia. 6. Left nephrolithiasis 7. Cholelithiasis Electronically Signed   By: Abigail Miyamoto M.D.   On: 06/19/2020 12:12    ASSESSMENT & PLAN:   Cancer of right colon ([place]) #Right-sided colon adenocarcinoma-with synchronous metastasis to liver/unresectable.  Currently on FOLFOX plus Avastin; CT scan SEP 4th 2021- Progressive disease-left lower lobe lung nodule 8 mm [previously 4 mm]; increase in size of the hepatic metastases by few millimeters.;  Soft tissue nodule adjacent to anastomotic site again increased by few millimeters.  Given the overall progression noted on imaging-would recommend switching chemotherapy- see below.   #Proceed with today- of FOLFOX plus Avastin chemotherapy.  Labs today reviewed;  acceptable for treatment today-except for mild thrombocytopenia platelets -122. Will plan swicthing to FOLFIRI at next visit.   # PN- G-1-2; sec to oxaliplatin.  STABLE.   # HTN- systolic 062I- STABLE;  recommend monitoring at home.    # DISPOSITION:  #FOLFOX+ Avastin today; pump off in 2 days.  # follow up in 2 weeks/ [9/20] MD; labs- cbc/cmp/UA/ CEA; FOLFIRI [new]+ avastin;pump off in 2 days -Dr.B  # I reviewed the blood work- with the patient in detail; also reviewed the imaging independently [as summarized above]; and with the patient in detail.    All questions were answered. The patient knows to call the clinic with any problems, questions or concerns.    Cammie Sickle, MD 06/25/2020 5:50 PM

## 2020-06-26 LAB — CEA: CEA: 36 ng/mL — ABNORMAL HIGH (ref 0.0–4.7)

## 2020-06-27 ENCOUNTER — Other Ambulatory Visit: Payer: Self-pay

## 2020-06-27 ENCOUNTER — Inpatient Hospital Stay: Payer: Medicare HMO

## 2020-06-27 DIAGNOSIS — Z5111 Encounter for antineoplastic chemotherapy: Secondary | ICD-10-CM | POA: Diagnosis not present

## 2020-06-27 DIAGNOSIS — Z5112 Encounter for antineoplastic immunotherapy: Secondary | ICD-10-CM | POA: Diagnosis not present

## 2020-06-27 DIAGNOSIS — Z95828 Presence of other vascular implants and grafts: Secondary | ICD-10-CM

## 2020-06-27 DIAGNOSIS — C182 Malignant neoplasm of ascending colon: Secondary | ICD-10-CM | POA: Diagnosis not present

## 2020-06-27 DIAGNOSIS — C787 Secondary malignant neoplasm of liver and intrahepatic bile duct: Secondary | ICD-10-CM | POA: Diagnosis not present

## 2020-06-27 MED ORDER — HEPARIN SOD (PORK) LOCK FLUSH 100 UNIT/ML IV SOLN
500.0000 [IU] | Freq: Once | INTRAVENOUS | Status: AC
  Administered 2020-06-27: 500 [IU] via INTRAVENOUS
  Filled 2020-06-27: qty 5

## 2020-06-27 MED ORDER — SODIUM CHLORIDE 0.9% FLUSH
10.0000 mL | Freq: Once | INTRAVENOUS | Status: AC
  Administered 2020-06-27: 10 mL via INTRAVENOUS
  Filled 2020-06-27: qty 10

## 2020-07-02 ENCOUNTER — Inpatient Hospital Stay (HOSPITAL_BASED_OUTPATIENT_CLINIC_OR_DEPARTMENT_OTHER): Payer: Medicare HMO | Admitting: Hospice and Palliative Medicine

## 2020-07-02 DIAGNOSIS — Z515 Encounter for palliative care: Secondary | ICD-10-CM | POA: Diagnosis not present

## 2020-07-02 DIAGNOSIS — C182 Malignant neoplasm of ascending colon: Secondary | ICD-10-CM | POA: Diagnosis not present

## 2020-07-02 MED ORDER — MELOXICAM 15 MG PO TABS: 15.0000 mg | ORAL_TABLET | Freq: Every day | ORAL | 0 refills | Status: DC

## 2020-07-02 NOTE — Progress Notes (Signed)
Virtual Visit via Telephone Note  I connected with Oscar [name] on 07/02/20 at 11:00 AM EDT by telephone and verified that I am speaking with the correct person using two identifiers.   I discussed the limitations, risks, security and privacy concerns of performing an evaluation and management service by telephone and the availability of in person appointments. I also discussed with the patient that there may be a patient responsible charge related to this service. The patient expressed understanding and agreed to proceed.   History of Present Illness: [name] is a 79 year old man with multiple medical problems including stage IV colorectal cancer currently on systemic treatment with FOLFOX.   Observations/Objective: Virtual visit attempted but had to call patient instead.  Patient reports that he is doing well.  He denies any significant changes or concerns.  No symptomatic complaints at present.  Patient reports that his quality of life is still acceptable.  He is playing golf routinely.  He does have occasional pain, which he manages with as needed meloxicam.  Patient request a refill of meloxicam.  Assessment and Plan: Stage IV colorectal cancer -on treatment with FOLFIRI / BEVACIZUMAB  Patient is followed by Dr. Rogue Bussing.   Follow Up Instructions: Follow-up MyChart visit in 1 to 2 months   I discussed the assessment and treatment plan with the patient. The patient was provided an opportunity to ask questions and all were answered. The patient agreed with the plan and demonstrated an understanding of the instructions.   The patient was advised to call back or seek an in-person evaluation if the symptoms worsen or if the condition fails to improve as anticipated.  I provided 5 minutes of non-face-to-face time during this encounter.   [name], NP

## 2020-07-08 ENCOUNTER — Inpatient Hospital Stay: Payer: Medicare HMO

## 2020-07-08 ENCOUNTER — Other Ambulatory Visit: Payer: Self-pay

## 2020-07-08 ENCOUNTER — Inpatient Hospital Stay: Payer: Medicare HMO | Admitting: Internal Medicine

## 2020-07-08 ENCOUNTER — Encounter: Payer: Self-pay | Admitting: Internal Medicine

## 2020-07-08 VITALS — BP 127/71 | HR 65 | Temp 98.2°F | Resp 16 | Ht 72.0 in | Wt 198.2 lb

## 2020-07-08 DIAGNOSIS — Z7189 Other specified counseling: Secondary | ICD-10-CM | POA: Diagnosis not present

## 2020-07-08 DIAGNOSIS — C182 Malignant neoplasm of ascending colon: Secondary | ICD-10-CM | POA: Diagnosis not present

## 2020-07-08 DIAGNOSIS — Z5112 Encounter for antineoplastic immunotherapy: Secondary | ICD-10-CM | POA: Diagnosis not present

## 2020-07-08 DIAGNOSIS — Z5111 Encounter for antineoplastic chemotherapy: Secondary | ICD-10-CM | POA: Diagnosis not present

## 2020-07-08 DIAGNOSIS — C787 Secondary malignant neoplasm of liver and intrahepatic bile duct: Secondary | ICD-10-CM | POA: Diagnosis not present

## 2020-07-08 LAB — COMPREHENSIVE METABOLIC PANEL
ALT: 19 U/L (ref 0–44)
AST: 31 U/L (ref 15–41)
Albumin: 3.1 g/dL — ABNORMAL LOW (ref 3.5–5.0)
Alkaline Phosphatase: 102 U/L (ref 38–126)
Anion gap: 6 (ref 5–15)
BUN: 9 mg/dL (ref 8–23)
CO2: 25 mmol/L (ref 22–32)
Calcium: 8 mg/dL — ABNORMAL LOW (ref 8.9–10.3)
Chloride: 108 mmol/L (ref 98–111)
Creatinine, Ser: 0.7 mg/dL (ref 0.61–1.24)
GFR calc Af Amer: >60 mL/min (ref >60)
GFR calc non Af Amer: >60 mL/min (ref >60)
Glucose, Bld: 209 mg/dL — ABNORMAL HIGH (ref 70–99)
Potassium: 3.6 mmol/L (ref 3.5–5.1)
Sodium: 139 mmol/L (ref 135–145)
Total Bilirubin: 0.7 mg/dL (ref 0.3–1.2)
Total Protein: 6.5 g/dL (ref 6.5–8.1)

## 2020-07-08 LAB — CBC WITH DIFFERENTIAL/PLATELET
Abs Immature Granulocytes: 0 10*3/uL (ref 0.00–0.07)
Basophils Absolute: 0 10*3/uL (ref 0.0–0.1)
Basophils Relative: 1 %
Eosinophils Absolute: 0.4 10*3/uL (ref 0.0–0.5)
Eosinophils Relative: 9 %
HCT: 36.1 % — ABNORMAL LOW (ref 39.0–52.0)
Hemoglobin: 12.7 g/dL — ABNORMAL LOW (ref 13.0–17.0)
Immature Granulocytes: 0 %
Lymphocytes Relative: 30 %
Lymphs Abs: 1.2 10*3/uL (ref 0.7–4.0)
MCH: 34.5 pg — ABNORMAL HIGH (ref 26.0–34.0)
MCHC: 35.2 g/dL (ref 30.0–36.0)
MCV: 98.1 fL (ref 80.0–100.0)
Monocytes Absolute: 0.6 10*3/uL (ref 0.1–1.0)
Monocytes Relative: 15 %
Neutro Abs: 1.7 10*3/uL (ref 1.7–7.7)
Neutrophils Relative %: 45 %
Platelets: 99 10*3/uL — ABNORMAL LOW (ref 150–400)
RBC: 3.68 MIL/uL — ABNORMAL LOW (ref 4.22–5.81)
RDW: 14.4 % (ref 11.5–15.5)
WBC: 3.9 10*3/uL — ABNORMAL LOW (ref 4.0–10.5)
nRBC: 0 % (ref 0.0–0.2)

## 2020-07-08 LAB — URINALYSIS, COMPLETE (UACMP) WITH MICROSCOPIC
Bacteria, UA: NONE SEEN
Bilirubin Urine: NEGATIVE
Glucose, UA: >=500 mg/dL — AB
Hgb urine dipstick: NEGATIVE
Ketones, ur: NEGATIVE mg/dL
Leukocytes,Ua: NEGATIVE
Nitrite: NEGATIVE
Protein, ur: 30 mg/dL — AB
Specific Gravity, Urine: 1.024 (ref 1.005–1.030)
Squamous Epithelial / HPF: NONE SEEN (ref 0–5)
pH: 5 (ref 5.0–8.0)

## 2020-07-08 MED ORDER — SODIUM CHLORIDE 0.9 % IV SOLN
450.0000 mg | Freq: Once | INTRAVENOUS | Status: AC
  Administered 2020-07-08: 450 mg via INTRAVENOUS
  Filled 2020-07-08: qty 16

## 2020-07-08 MED ORDER — SODIUM CHLORIDE 0.9 % IV SOLN
180.0000 mg/m2 | Freq: Once | INTRAVENOUS | Status: AC
  Administered 2020-07-08: 380 mg via INTRAVENOUS
  Filled 2020-07-08: qty 15

## 2020-07-08 MED ORDER — SODIUM CHLORIDE 0.9 % IV SOLN
2369.0000 mg/m2 | INTRAVENOUS | Status: DC
  Administered 2020-07-08: 5000 mg via INTRAVENOUS
  Filled 2020-07-08: qty 100

## 2020-07-08 MED ORDER — ATROPINE SULFATE 1 MG/ML IJ SOLN
0.5000 mg | Freq: Once | INTRAMUSCULAR | Status: AC
  Administered 2020-07-08: 0.5 mg via INTRAVENOUS
  Filled 2020-07-08: qty 1

## 2020-07-08 MED ORDER — DIPHENOXYLATE-ATROPINE 2.5-0.025 MG PO TABS: 1.0000 | ORAL_TABLET | Freq: Four times a day (QID) | ORAL | 0 refills | Status: DC | PRN

## 2020-07-08 MED ORDER — PALONOSETRON HCL INJECTION 0.25 MG/5ML
0.2500 mg | Freq: Once | INTRAVENOUS | Status: AC
  Administered 2020-07-08: 0.25 mg via INTRAVENOUS

## 2020-07-08 MED ORDER — DIPHENOXYLATE-ATROPINE 2.5-0.025 MG PO TABS: 1.0000 | ORAL_TABLET | Freq: Once | ORAL | Status: DC

## 2020-07-08 MED ORDER — SODIUM CHLORIDE 0.9 % IV SOLN
10.0000 mg | Freq: Once | INTRAVENOUS | Status: AC
  Administered 2020-07-08: 10 mg via INTRAVENOUS
  Filled 2020-07-08: qty 10

## 2020-07-08 MED ORDER — SODIUM CHLORIDE 0.9 % IV SOLN
Freq: Once | INTRAVENOUS | Status: AC
  Filled 2020-07-08: qty 250

## 2020-07-08 NOTE — Progress Notes (Signed)
Dr. B would like to increase zirabev dose based on calculated dose.   Larene Beach, PharmD

## 2020-07-08 NOTE — Progress Notes (Signed)
[facility] CONSULT NOTE  Patient Care Team: Oscar Goodell, MD as PCP - General (Family Medicine) Oscar Patience, MD as Consulting Physician (Hematology and Oncology)  CHIEF COMPLAINTS/PURPOSE OF CONSULTATION: Colon cancer  #  Oncology History Overview Note  # MAY 2020- 3. 03/09/19 Liver biopsy. Microscopic examination shows malignant cells with glandular architecture consistent with adenocarcinoma. The malignant cells are positive for CK20 and CDX-2. These findings support the clinical impression of metastatic colon adenocarcinoma. 4. 03/10/19 R hemicolectomy. Tumor site cecum. Adenocarcinoma. Mucinous features present. G2. No tumor deposits. Invades visceral peritoneum. No tumor perforation. LVI present. PNI not identified. All margins uninvolved. 1/12 LNs. PT4apN1. Periappendiceal inflammation c/w resolving abscess. Microsatellite stable (MSS). [Dr.Mettu; [facility]]  # SEP 4th 2020 [compared to May 2020]  Interval increase in size of the metastases to the hepatic dome, The metastasis to the left hepatic lobe is unchanged; 2.  New subcentimeter hypoattenuating lesion in the inferior right hepatic lobe, incompletely characterized on CT. 3.  Postsurgical changes following right hemicolectomy.  # OCT 2020- FOLFOX +avastin; CT dec 22nd 2020- [compared to Oscar [name] sep 9th 2020]-Liver- slight progression versus stable disease; CT scan SEP 4th 2021- Progressive disease-left lower lobe lung nodule 8 mm [previously 4 mm]; increase in size of the hepatic metastases by few millimeters.;  Soft tissue nodule adjacent to anastomotic site again increased by few millimeters.STOP FOLFOX; cont avastin  #  SEP 20th, 2021- FOLFIRI+ AVASTIN  # NGS/F-ONE-MUTATED K-RAS [G]  # PALLIATIVE CARE EVALUATION: 09/20/2019-Oscar [name]  # PAIN MANAGEMENT: NA   DIAGNOSIS: COLON CANCER  STAGE:  IV     ;  GOALS:Palliative  CURRENT/MOST RECENT THERAPY : FOLFIRI+ avastin [C]    Cancer of right colon (HCC)  07/05/2019  Initial Diagnosis   Cancer of right colon (HCC)   07/24/2019 - 06/25/2020 Chemotherapy   The patient had dexamethasone (DECADRON) 4 MG tablet, 8 mg, Oral, Daily, 1 of 1 cycle, Start date: --, End date: -- palonosetron (ALOXI) injection 0.25 mg, 0.25 mg, Intravenous,  Once, 23 of 25 cycles Administration: 0.25 mg (07/24/2019), 0.25 mg (08/07/2019), 0.25 mg (08/21/2019), 0.25 mg (09/04/2019), 0.25 mg (09/20/2019), 0.25 mg (10/04/2019), 0.25 mg (10/16/2019), 0.25 mg (10/30/2019), 0.25 mg (11/22/2019), 0.25 mg (12/06/2019), 0.25 mg (12/20/2019), 0.25 mg (01/03/2020), 0.25 mg (01/29/2020), 0.25 mg (02/12/2020), 0.25 mg (02/26/2020), 0.25 mg (03/11/2020), 0.25 mg (03/25/2020), 0.25 mg (04/08/2020), 0.25 mg (04/24/2020), 0.25 mg (05/08/2020), 0.25 mg (05/22/2020), 0.25 mg (06/10/2020), 0.25 mg (06/25/2020) leucovorin 800 mg in dextrose 5 % 250 mL infusion, 844 mg, Intravenous,  Once, 23 of 25 cycles Administration: 800 mg (07/24/2019), 800 mg (08/07/2019), 800 mg (08/21/2019), 800 mg (09/04/2019), 800 mg (09/20/2019), 800 mg (10/04/2019), 800 mg (10/16/2019), 800 mg (10/30/2019), 800 mg (11/22/2019), 800 mg (12/06/2019), 800 mg (12/20/2019), 800 mg (01/03/2020), 800 mg (01/29/2020), 800 mg (02/12/2020), 800 mg (02/26/2020), 800 mg (03/11/2020), 800 mg (03/25/2020), 800 mg (04/08/2020), 800 mg (04/24/2020), 800 mg (05/08/2020), 800 mg (05/22/2020), 800 mg (06/10/2020), 800 mg (06/25/2020) oxaliplatin (ELOXATIN) 180 mg in dextrose 5 % 500 mL chemo infusion, 85 mg/m2 = 180 mg, Intravenous,  Once, 23 of 25 cycles Dose modification: 178 mg (original dose 85 mg/m2, Cycle 17, Reason: Other (see comments), Comment: insurance adjusted dose ) Administration: 180 mg (07/24/2019), 180 mg (08/07/2019), 180 mg (08/21/2019), 180 mg (09/04/2019), 180 mg (09/20/2019), 180 mg (10/04/2019), 180 mg (10/16/2019), 180 mg (10/30/2019), 180 mg (11/22/2019), 180 mg (12/06/2019), 180 mg (12/20/2019), 180 mg (01/03/2020), 180 mg (01/29/2020), 180 mg (02/12/2020), 180 mg (02/26/2020), 180  mg (03/11/2020),  180 mg (04/08/2020), 180 mg (04/24/2020), 180 mg (05/08/2020), 180 mg (05/22/2020), 180 mg (06/10/2020), 180 mg (06/25/2020) fluorouracil (ADRUCIL) 5,000 mg in sodium chloride 0.9 % 150 mL chemo infusion, 5,050 mg, Intravenous, 1 Day/Dose, 23 of 25 cycles Administration: 5,000 mg (07/24/2019), 5,000 mg (08/07/2019), 5,000 mg (08/21/2019), 5,050 mg (09/04/2019), 5,000 mg (10/04/2019), 5,000 mg (10/16/2019), 5,000 mg (11/22/2019), 5,000 mg (12/06/2019), 5,000 mg (12/20/2019), 5,000 mg (01/03/2020), 5,000 mg (01/29/2020), 5,000 mg (02/12/2020), 5,000 mg (02/26/2020), 5,000 mg (03/11/2020), 5,000 mg (03/25/2020), 5,000 mg (04/08/2020), 5,000 mg (04/24/2020), 5,000 mg (05/08/2020), 5,000 mg (05/22/2020), 5,000 mg (06/10/2020), 5,000 mg (06/25/2020) bevacizumab-bvzr (ZIRABEV) 400 mg in sodium chloride 0.9 % 100 mL chemo infusion, 5 mg/kg = 400 mg, Intravenous,  Once, 23 of 25 cycles Administration: 400 mg (08/07/2019), 400 mg (08/21/2019), 400 mg (09/04/2019), 400 mg (09/20/2019), 400 mg (10/04/2019), 400 mg (10/16/2019), 400 mg (10/30/2019), 400 mg (11/22/2019), 400 mg (12/06/2019), 400 mg (12/20/2019), 400 mg (01/03/2020), 400 mg (01/29/2020), 400 mg (02/12/2020), 400 mg (02/26/2020), 400 mg (03/11/2020), 400 mg (03/25/2020), 400 mg (04/08/2020), 400 mg (04/24/2020), 400 mg (05/08/2020), 400 mg (05/22/2020), 400 mg (06/10/2020), 400 mg (06/25/2020)  for chemotherapy treatment.    07/08/2020 -  Chemotherapy   The patient had dexamethasone (DECADRON) 4 MG tablet, 8 mg, Oral, Daily, 1 of 1 cycle, Start date: --, End date: -- palonosetron (ALOXI) injection 0.25 mg, 0.25 mg, Intravenous,  Once, 1 of 8 cycles Administration: 0.25 mg (07/08/2020) irinotecan (CAMPTOSAR) 380 mg in sodium chloride 0.9 % 500 mL chemo infusion, 180 mg/m2 = 380 mg, Intravenous,  Once, 1 of 8 cycles Administration: 380 mg (07/08/2020) fluorouracil (ADRUCIL) 5,000 mg in sodium chloride 0.9 % 150 mL chemo infusion, 2,369 mg/m2 = 5,050 mg, Intravenous, 1 Day/Dose, 1 of 8 cycles Administration:  5,000 mg (07/08/2020) bevacizumab-bvzr (ZIRABEV) 450 mg in sodium chloride 0.9 % 100 mL chemo infusion, 400 mg, Intravenous,  Once, 1 of 8 cycles Administration: 450 mg (07/08/2020)  for chemotherapy treatment.      HISTORY OF PRESENTING ILLNESS:  Oscar [name] 79 y.o.  male with metastatic colon cancer to the liver currently on FOLFOX plus Avastin is here for follow-up/proceed with FOLFIRI plus Avastin chemotherapy.  Patient denies any nausea vomiting.  Appetite is fair.  No weight loss.  Continues to have mild tingling and numbness in extremities.  Chronic joint pain shoulder pain.   Review of Systems  Constitutional: Positive for malaise/fatigue. Negative for chills, diaphoresis, fever and weight loss.  HENT: Negative for nosebleeds and sore throat.   Eyes: Negative for double vision.  Respiratory: Negative for cough, hemoptysis, sputum production, shortness of breath and wheezing.   Cardiovascular: Negative for chest pain, palpitations, orthopnea and leg swelling.  Gastrointestinal: Negative for abdominal pain, blood in stool, constipation, diarrhea, heartburn, melena, nausea and vomiting.  Genitourinary: Negative for dysuria, frequency and urgency.  Musculoskeletal: Positive for back pain and joint pain.  Skin: Negative.  Negative for itching and rash.  Neurological: Positive for tingling. Negative for dizziness, focal weakness, weakness and headaches.  Endo/Heme/Allergies: Does not bruise/bleed easily.  Psychiatric/Behavioral: Negative for depression. The patient is not nervous/anxious and does not have insomnia.      MEDICAL HISTORY:  Past Medical History:  Diagnosis Date   Arthritis    Cancer ([place])    colon cancer 02/2019 per pt    Diabetes mellitus without complication ([place])    H/O colon cancer, stage IV    Hyperlipemia    Hypertension  SURGICAL HISTORY: Past Surgical History:  Procedure Laterality Date   IR IMAGING GUIDED PORT INSERTION  07/20/2019    JOINT REPLACEMENT      SOCIAL HISTORY: Social History   Socioeconomic History   Marital status: Married    Spouse name: Not on file   Number of children: Not on file   Years of education: Not on file   Highest education level: Not on file  Occupational History   Not on file  Tobacco Use   Smoking status: Current Every Day Smoker    Packs/day: 0.25    Types: Cigarettes   Smokeless tobacco: Never Used   Tobacco comment: 1 to 2 cigarettes a day occasionally  Vaping Use   Vaping Use: Never used  Substance and Sexual Activity   Alcohol use: No   Drug use: No   Sexual activity: Not on file  Other Topics Concern   Not on file  Social History Narrative   Recruitment consultant retd; lives in [place]; smoking 3cig/day; [3/4 ppd x started at 9 years]; no alcohol. Son & daughter; wife dementia [waiting for placement].    Social Determinants of Health   Financial Resource Strain:    Difficulty of Paying Living Expenses: Not on file  Food Insecurity:    Worried About Charity fundraiser in the Last Year: Not on file   [employer] of Food in the Last Year: Not on file  Transportation Needs:    Lack of Transportation (Medical): Not on file   Lack of Transportation (Non-Medical): Not on file  Physical Activity:    Days of Exercise per Week: Not on file   Minutes of Exercise per Session: Not on file  Stress:    Feeling of Stress : Not on file  Social Connections:    Frequency of Communication with Friends and Family: Not on file   Frequency of Social Gatherings with Friends and Family: Not on file   Attends Religious Services: Not on file   Active Member of Clubs or Organizations: Not on file   Attends Archivist Meetings: Not on file   Marital Status: Not on file  Intimate Partner Violence:    Fear of Current or Ex-Partner: Not on file   Emotionally Abused: Not on file   Physically Abused: Not on file   Sexually Abused: Not on file     FAMILY HISTORY: Family History  Problem Relation Age of Onset   Peptic Ulcer Disease Father     ALLERGIES:  is allergic to ace inhibitors.  MEDICATIONS:  Current Outpatient Medications  Medication Sig Dispense Refill   amLODipine (NORVASC) 5 MG tablet Take 5 mg by mouth daily.     fluticasone (FLONASE) 50 MCG/ACT nasal spray Place 1 spray into both nostrils daily as needed.      lidocaine-prilocaine (EMLA) cream Apply 1 application topically as needed. 30 g 0   meloxicam (MOBIC) 15 MG tablet Take 1 tablet (15 mg total) by mouth daily. 90 tablet 0   pravastatin (PRAVACHOL) 40 MG tablet Take 40 mg by mouth daily.     tamsulosin (FLOMAX) 0.4 MG CAPS capsule Take 1 capsule (0.4 mg total) by mouth daily. 30 capsule 11   diphenoxylate-atropine (LOMOTIL) 2.5-0.025 MG tablet Take 1 tablet by mouth 4 (four) times daily as needed for diarrhea or loose stools. Take it along with immodium 60 tablet 0   montelukast (SINGULAIR) 10 MG tablet TAKE 1 TABLET BY MOUTH EVERYDAY AT BEDTIME (Patient not taking: Reported on  03/25/2020) 90 tablet 1   ondansetron (ZOFRAN-ODT) 4 MG disintegrating tablet Take 1 tablet (4 mg total) by mouth every 6 (six) hours as needed for nausea. (Patient not taking: Reported on 12/05/2019) 20 tablet 0   prochlorperazine (COMPAZINE) 10 MG tablet Take 1 tablet (10 mg total) by mouth every 6 (six) hours as needed for nausea or vomiting. (Patient not taking: Reported on 12/05/2019) 40 tablet 1   Current Facility-Administered Medications  Medication Dose Route Frequency Provider Last Rate Last Admin   diphenoxylate-atropine (LOMOTIL) 2.5-0.025 MG per tablet 1 tablet  1 tablet Oral Once Cammie Sickle, MD       fluorouracil (ADRUCIL) 5,000 mg in sodium chloride 0.9 % 150 mL chemo infusion  2,369 mg/m2 (Treatment Plan Recorded) Intravenous 1 day or 1 dose Charlaine Dalton R, MD   5,000 mg at 07/08/20 1336      .  PHYSICAL EXAMINATION: ECOG PERFORMANCE  STATUS: 0 - Asymptomatic  Vitals:   07/08/20 0854  BP: 127/71  Pulse: 65  Resp: 16  Temp: 98.2 F (36.8 C)  SpO2: 100%   Filed Weights   07/08/20 0854  Weight: 198 lb 3.2 oz (89.9 kg)    Physical Exam HENT:     Head: Normocephalic and atraumatic.     Mouth/Throat:     Pharynx: No oropharyngeal exudate.  Eyes:     Pupils: Pupils are equal, round, and reactive to light.  Cardiovascular:     Rate and Rhythm: Normal rate and regular rhythm.  Pulmonary:     Effort: No respiratory distress.     Breath sounds: No wheezing.  Abdominal:     General: Bowel sounds are normal. There is no distension.     Palpations: Abdomen is soft. There is no mass.     Tenderness: There is no abdominal tenderness. There is no guarding or rebound.  Musculoskeletal:        General: No tenderness. Normal range of motion.     Cervical back: Normal range of motion and neck supple.  Skin:    General: Skin is warm.  Neurological:     Mental Status: He is alert and oriented to person, place, and time.  Psychiatric:        Mood and Affect: Affect normal.    LABORATORY DATA:  I have reviewed the data as listed Lab Results  Component Value Date   WBC 3.9 (L) 07/08/2020   HGB 12.7 (L) 07/08/2020   HCT 36.1 (L) 07/08/2020   MCV 98.1 07/08/2020   PLT 99 (L) 07/08/2020   Recent Labs    06/10/20 0858 06/25/20 0841 07/08/20 0843  NA 138 138 139  K 3.7 3.6 3.6  CL 107 108 108  CO2 $Re'25 24 25  'vFP$ GLUCOSE 205* 201* 209*  BUN $Re'11 10 9  'noE$ CREATININE 0.89 0.80 0.70  CALCIUM 8.2* 8.0* 8.0*  GFRNONAA >60 >60 >60  GFRAA >60 >60 >60  PROT 6.6 6.5 6.5  ALBUMIN 3.2* 3.2* 3.1*  AST 35 32 31  ALT $Re'23 20 19  'dIO$ ALKPHOS 112 121 102  BILITOT 0.6 0.8 0.7    RADIOGRAPHIC STUDIES: I have personally reviewed the radiological images as listed and agreed with the findings in the report. CT Abdomen Pelvis W Contrast  Result Date: 06/19/2020 CLINICAL DATA:  Restaging of colon cancer diagnosed March of 2020. Colon  resection. Appendectomy. On chemotherapy. Cecal primary. EXAM: CT ABDOMEN AND PELVIS WITH CONTRAST TECHNIQUE: Multidetector CT imaging of the abdomen and pelvis was performed using  the standard protocol following bolus administration of intravenous contrast. CONTRAST:  152mL OMNIPAQUE IOHEXOL 300 MG/ML  SOLN COMPARISON:  03/20/2020 PET.  01/12/2020 CT FINDINGS: Lower chest: Left lower lobe pulmonary nodule of 8 mm on 08/03, enlarged from 4 mm on prior CT and PET. Normal heart size without pericardial or pleural effusion. Right coronary artery atherosclerosis. Ulcerative plaque in the descending thoracic aorta. Hepatobiliary: Right hepatic lobe 1.0 cm lesion on 29/2 is similar to slightly more conspicuous than on the prior diagnostic CT. Segment 4A dominant hepatic cyst. Segment 2 metastasis measures 3.9 x 3.6 cm on 16/2 versus 3.7 x 3.6 cm on 01/12/2020 (when remeasured). 3.4 x 3.0 cm on the 03/20/2020 PET. Adjacent nodule along the left hepatic capsule measures 6 mm on 15/2 versus 4 mm on 01/12/2020 (when remeasured). Subtle gallstones are less apparent today than on the prior exams. Common duct caliber of up to 1.0 cm on 26/2 is felt to be similar to on the prior diagnostic CT. No well-defined choledocholithiasis. Pancreas: Mild pancreatic atrophy. No duct dilatation or acute inflammation. Spleen: Normal in size, without focal abnormality. Adrenals/Urinary Tract: Normal adrenal glands. 4.2 cm left renal cyst. Left renal collecting system calculi of up to 3 mm. Normal right kidney. No hydronephrosis. Decompressed urinary bladder Stomach/Bowel: Portions of the stomach are underdistended. Right hemicolectomy. An implant adjacent to surgical sutures measures 1.9 x 1.8 cm on 45/2 versus 1.5 x 1.2 cm on 01/12/2020 and 1.7 x 1.4 cm on 03/20/2020. Normal small bowel. Vascular/Lymphatic: Aortic atherosclerosis. No abdominopelvic adenopathy. Reproductive: Normal prostate. Other: No significant free fluid. Minimal nodularity  anterior to the hepatic flexure of the colon on 36/2 is felt to be similar, nonspecific. No well-defined omental/peritoneal metastasis. Musculoskeletal: Partial degenerative fusion of the right sacroiliac joint. Lumbosacral spondylosis. IMPRESSION: 1. Significant enlargement of left lower lobe pulmonary nodule consistent with progressive metastasis. 2. Mild progression of hepatic metastasis, including a dominant lesion within segment 2 and an adjacent area of capsular nodularity. 3. Mild enlargement of a anastomotic implant in the setting of right hemicolectomy. 4. Coronary artery atherosclerosis. Aortic Atherosclerosis (ICD10-I70.0). 5.  Tiny hiatal hernia. 6. Left nephrolithiasis 7. Cholelithiasis Electronically Signed   By: Abigail Miyamoto M.D.   On: 06/19/2020 12:12    ASSESSMENT & PLAN:   Cancer of right colon ([place]) #Right-sided colon adenocarcinoma-with synchronous metastasis to liver/unresectable.  Currently on FOLFOX plus Avastin; CT scan SEP 4th 2021- Progressive disease-left lower lobe lung nodule 8 mm [previously 4 mm]; increase in size of the hepatic metastases by few millimeters.;  Soft tissue nodule adjacent to anastomotic site again increased by few millimeters.  Given the overall progression noted on imaging-would recommend switching chemotherapy tp FOLFIRI + avastin. CEA- SEP -36/rising.   #Proceed with today- of FOLFIRI plus Avastin chemotherapy.  Labs today reviewed;  acceptable for treatment today-except for mild thrombocytopenia platelets -99.  Again reminded the patient of potential diarrhea.  Recommend Lomotil.  New prescription sent.  # PN- G-1-2; sec to oxaliplatin.  STABLE.    # HTN- systolic 585I- STABLE;  recommend monitoring at home.    #FOLFIRI+ Avastin today; pump off in 2 days.  # follow up in 2 weeks/ [ MD; labs- cbc/cmp/UA/ CEA; FOLFIRI + avastin;pump off in 2 days -Dr.B  All questions were answered. The patient knows to call the clinic with any problems, questions  or concerns.    Cammie Sickle, MD 07/08/2020 3:55 PM

## 2020-07-08 NOTE — Assessment & Plan Note (Addendum)
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  Currently on FOLFOX plus Avastin; CT scan SEP 4th 2021- Progressive disease-left lower lobe lung nodule 8 mm [previously 4 mm]; increase in size of the hepatic metastases by few millimeters.;  Soft tissue nodule adjacent to anastomotic site again increased by few millimeters.  Given the overall progression noted on imaging-would recommend switching chemotherapy tp FOLFIRI + avastin. CEA- SEP -36/rising.   #Proceed with today- of FOLFIRI plus Avastin chemotherapy.  Labs today reviewed;  acceptable for treatment today-except for mild thrombocytopenia platelets -99.  Again reminded the patient of potential diarrhea.  Recommend Lomotil.  New prescription sent.  # PN- G-1-2; sec to oxaliplatin.  STABLE.    # HTN- systolic 283T- STABLE;  recommend monitoring at home.    #FOLFIRI+ Avastin today; pump off in 2 days.  # follow up in 2 weeks/ [ MD; labs- cbc/cmp/UA/ CEA; FOLFIRI + avastin;pump off in 2 days -Dr.B

## 2020-07-09 LAB — CEA: CEA: 34.4 ng/mL — ABNORMAL HIGH (ref 0.0–4.7)

## 2020-07-10 ENCOUNTER — Inpatient Hospital Stay: Payer: Medicare HMO

## 2020-07-10 ENCOUNTER — Other Ambulatory Visit: Payer: Self-pay

## 2020-07-10 DIAGNOSIS — C182 Malignant neoplasm of ascending colon: Secondary | ICD-10-CM | POA: Diagnosis not present

## 2020-07-10 DIAGNOSIS — C787 Secondary malignant neoplasm of liver and intrahepatic bile duct: Secondary | ICD-10-CM | POA: Diagnosis not present

## 2020-07-10 DIAGNOSIS — Z5111 Encounter for antineoplastic chemotherapy: Secondary | ICD-10-CM | POA: Diagnosis not present

## 2020-07-10 DIAGNOSIS — Z5112 Encounter for antineoplastic immunotherapy: Secondary | ICD-10-CM | POA: Diagnosis not present

## 2020-07-10 DIAGNOSIS — Z7189 Other specified counseling: Secondary | ICD-10-CM

## 2020-07-10 MED ORDER — HEPARIN SOD (PORK) LOCK FLUSH 100 UNIT/ML IV SOLN
500.0000 [IU] | Freq: Once | INTRAVENOUS | Status: AC | PRN
  Administered 2020-07-10: 500 [IU]
  Filled 2020-07-10: qty 5

## 2020-07-10 MED ORDER — SODIUM CHLORIDE 0.9% FLUSH
10.0000 mL | INTRAVENOUS | Status: DC | PRN
  Administered 2020-07-10: 10 mL
  Filled 2020-07-10: qty 10

## 2020-07-10 MED ORDER — HEPARIN SOD (PORK) LOCK FLUSH 100 UNIT/ML IV SOLN
INTRAVENOUS | Status: AC
  Filled 2020-07-10: qty 5

## 2020-07-10 NOTE — Progress Notes (Signed)
Pt here for pump dc.Pt denies complaints/issues. Port flushed and de-accessed per protocol . Pt discharged stable. After pt left cancer center and disposing of chemo bag/ tubing - realized that bag appeared to be half full. Checked pump and it stated correct rate of 5.4 ml/hr and total volume infused of 250 ml ( which was total volume of bag) .  There were small air bubbles in tubing. Made pharmacist, MD and Charge nurse aware. New pump will be ordered for next treatment.

## 2020-07-22 ENCOUNTER — Other Ambulatory Visit: Payer: Self-pay

## 2020-07-22 ENCOUNTER — Inpatient Hospital Stay: Payer: Medicare HMO

## 2020-07-22 ENCOUNTER — Inpatient Hospital Stay: Payer: Medicare HMO | Attending: Internal Medicine

## 2020-07-22 ENCOUNTER — Inpatient Hospital Stay (HOSPITAL_BASED_OUTPATIENT_CLINIC_OR_DEPARTMENT_OTHER): Payer: Medicare HMO | Admitting: Internal Medicine

## 2020-07-22 DIAGNOSIS — Z5111 Encounter for antineoplastic chemotherapy: Secondary | ICD-10-CM | POA: Insufficient documentation

## 2020-07-22 DIAGNOSIS — Z7189 Other specified counseling: Secondary | ICD-10-CM

## 2020-07-22 DIAGNOSIS — C182 Malignant neoplasm of ascending colon: Secondary | ICD-10-CM

## 2020-07-22 DIAGNOSIS — Z5112 Encounter for antineoplastic immunotherapy: Secondary | ICD-10-CM | POA: Diagnosis not present

## 2020-07-22 DIAGNOSIS — C787 Secondary malignant neoplasm of liver and intrahepatic bile duct: Secondary | ICD-10-CM | POA: Insufficient documentation

## 2020-07-22 LAB — CBC WITH DIFFERENTIAL/PLATELET
Abs Immature Granulocytes: 0.01 10*3/uL (ref 0.00–0.07)
Basophils Absolute: 0.1 10*3/uL (ref 0.0–0.1)
Basophils Relative: 1 %
Eosinophils Absolute: 0.4 10*3/uL (ref 0.0–0.5)
Eosinophils Relative: 9 %
HCT: 36.2 % — ABNORMAL LOW (ref 39.0–52.0)
Hemoglobin: 12.6 g/dL — ABNORMAL LOW (ref 13.0–17.0)
Immature Granulocytes: 0 %
Lymphocytes Relative: 29 %
Lymphs Abs: 1.2 10*3/uL (ref 0.7–4.0)
MCH: 34.4 pg — ABNORMAL HIGH (ref 26.0–34.0)
MCHC: 34.8 g/dL (ref 30.0–36.0)
MCV: 98.9 fL (ref 80.0–100.0)
Monocytes Absolute: 0.6 10*3/uL (ref 0.1–1.0)
Monocytes Relative: 15 %
Neutro Abs: 1.8 10*3/uL (ref 1.7–7.7)
Neutrophils Relative %: 46 %
Platelets: 132 10*3/uL — ABNORMAL LOW (ref 150–400)
RBC: 3.66 MIL/uL — ABNORMAL LOW (ref 4.22–5.81)
RDW: 14 % (ref 11.5–15.5)
WBC: 3.9 10*3/uL — ABNORMAL LOW (ref 4.0–10.5)
nRBC: 0 % (ref 0.0–0.2)

## 2020-07-22 LAB — URINALYSIS, COMPLETE (UACMP) WITH MICROSCOPIC
Bacteria, UA: NONE SEEN
Bilirubin Urine: NEGATIVE
Glucose, UA: NEGATIVE mg/dL
Hgb urine dipstick: NEGATIVE
Ketones, ur: 5 mg/dL — AB
Leukocytes,Ua: NEGATIVE
Nitrite: NEGATIVE
Protein, ur: 30 mg/dL — AB
Specific Gravity, Urine: 1.028 (ref 1.005–1.030)
Squamous Epithelial / HPF: NONE SEEN (ref 0–5)
pH: 5 (ref 5.0–8.0)

## 2020-07-22 LAB — COMPREHENSIVE METABOLIC PANEL
ALT: 19 U/L (ref 0–44)
AST: 29 U/L (ref 15–41)
Albumin: 3.1 g/dL — ABNORMAL LOW (ref 3.5–5.0)
Alkaline Phosphatase: 108 U/L (ref 38–126)
Anion gap: 6 (ref 5–15)
BUN: 10 mg/dL (ref 8–23)
CO2: 25 mmol/L (ref 22–32)
Calcium: 8.1 mg/dL — ABNORMAL LOW (ref 8.9–10.3)
Chloride: 107 mmol/L (ref 98–111)
Creatinine, Ser: 0.89 mg/dL (ref 0.61–1.24)
GFR calc Af Amer: >60 mL/min (ref >60)
GFR calc non Af Amer: >60 mL/min (ref >60)
Glucose, Bld: 195 mg/dL — ABNORMAL HIGH (ref 70–99)
Potassium: 3.6 mmol/L (ref 3.5–5.1)
Sodium: 138 mmol/L (ref 135–145)
Total Bilirubin: 0.8 mg/dL (ref 0.3–1.2)
Total Protein: 6.7 g/dL (ref 6.5–8.1)

## 2020-07-22 MED ORDER — SODIUM CHLORIDE 0.9 % IV SOLN
5.1400 mg/kg | Freq: Once | INTRAVENOUS | Status: AC
  Administered 2020-07-22: 450 mg via INTRAVENOUS
  Filled 2020-07-22: qty 16

## 2020-07-22 MED ORDER — SODIUM CHLORIDE 0.9 % IV SOLN
Freq: Once | INTRAVENOUS | Status: AC
  Filled 2020-07-22: qty 250

## 2020-07-22 MED ORDER — SODIUM CHLORIDE 0.9% FLUSH
10.0000 mL | Freq: Once | INTRAVENOUS | Status: AC
  Administered 2020-07-22: 10 mL via INTRAVENOUS
  Filled 2020-07-22: qty 10

## 2020-07-22 MED ORDER — SODIUM CHLORIDE 0.9 % IV SOLN
180.0000 mg/m2 | Freq: Once | INTRAVENOUS | Status: AC
  Administered 2020-07-22: 380 mg via INTRAVENOUS
  Filled 2020-07-22: qty 4

## 2020-07-22 MED ORDER — SODIUM CHLORIDE 0.9 % IV SOLN
10.0000 mg | Freq: Once | INTRAVENOUS | Status: AC
  Administered 2020-07-22: 10 mg via INTRAVENOUS
  Filled 2020-07-22: qty 10

## 2020-07-22 MED ORDER — PALONOSETRON HCL INJECTION 0.25 MG/5ML
0.2500 mg | Freq: Once | INTRAVENOUS | Status: AC
  Administered 2020-07-22: 0.25 mg via INTRAVENOUS
  Filled 2020-07-22: qty 5

## 2020-07-22 MED ORDER — SODIUM CHLORIDE 0.9 % IV SOLN
2369.0000 mg/m2 | INTRAVENOUS | Status: DC
  Administered 2020-07-22: 5000 mg via INTRAVENOUS
  Filled 2020-07-22: qty 100

## 2020-07-22 MED ORDER — DIPHENOXYLATE-ATROPINE 2.5-0.025 MG PO TABS
1.0000 | ORAL_TABLET | Freq: Once | ORAL | Status: AC
  Administered 2020-07-22: 1 via ORAL
  Filled 2020-07-22: qty 1

## 2020-07-22 NOTE — Progress Notes (Signed)
[place] NOTE  Patient Care Team: Sofie Hartigan, MD as PCP - General (Family Medicine) [name], MD as Consulting Physician (Hematology and Oncology)  CHIEF COMPLAINTS/PURPOSE OF CONSULTATION: Colon cancer  #  Oncology History Overview Note  # MAY 2020- 3. 03/09/19 Liver biopsy. Microscopic examination shows malignant cells with glandular architecture consistent with adenocarcinoma. The malignant cells are positive for CK20 and CDX-2. These findings support the clinical impression of metastatic colon adenocarcinoma. 4. 03/10/19 R hemicolectomy. Tumor site cecum. Adenocarcinoma. Mucinous features present. G2. No tumor deposits. Invades visceral peritoneum. No tumor perforation. LVI present. PNI not identified. All margins uninvolved. 1/12 LNs. PT4apN1. Periappendiceal inflammation c/w resolving abscess. Microsatellite stable (MSS). [Dr.Mettu; [facility]]  # SEP 4th 2020 [compared to May 2020]  Interval increase in size of the metastases to the hepatic dome, The metastasis to the left hepatic lobe is unchanged; 2.  New subcentimeter hypoattenuating lesion in the inferior right hepatic lobe, incompletely characterized on CT. 3.  Postsurgical changes following right hemicolectomy.  # OCT 2020- FOLFOX +avastin; CT dec 22nd 2020- [compared to [facility] sep 9th 2020]-Liver- slight progression versus stable disease; CT scan SEP 4th 2021- Progressive disease-left lower lobe lung nodule 8 mm [previously 4 mm]; increase in size of the hepatic metastases by few millimeters.;  Soft tissue nodule adjacent to anastomotic site again increased by few millimeters.STOP FOLFOX; cont avastin  #  SEP 20th, 2021- FOLFIRI+ AVASTIN  # NGS/F-ONE-MUTATED K-RAS [G]  # PALLIATIVE CARE EVALUATION: 09/20/2019-Oscar [name]  # PAIN MANAGEMENT: NA   DIAGNOSIS: COLON CANCER  STAGE:  IV     ;  GOALS:Palliative  CURRENT/MOST RECENT THERAPY : FOLFIRI+ avastin [C]    Cancer of right colon ([place])  07/05/2019  Initial Diagnosis   Cancer of right colon ([place])   07/24/2019 - 06/25/2020 Chemotherapy   The patient had dexamethasone (DECADRON) 4 MG tablet, 8 mg, Oral, Daily, 1 of 1 cycle, Start date: --, End date: -- palonosetron (ALOXI) injection 0.25 mg, 0.25 mg, Intravenous,  Once, 23 of 25 cycles Administration: 0.25 mg (07/24/2019), 0.25 mg (08/07/2019), 0.25 mg (08/21/2019), 0.25 mg (09/04/2019), 0.25 mg (09/20/2019), 0.25 mg (10/04/2019), 0.25 mg (10/16/2019), 0.25 mg (10/30/2019), 0.25 mg (11/22/2019), 0.25 mg (12/06/2019), 0.25 mg (12/20/2019), 0.25 mg (01/03/2020), 0.25 mg (01/29/2020), 0.25 mg (02/12/2020), 0.25 mg (02/26/2020), 0.25 mg (03/11/2020), 0.25 mg (03/25/2020), 0.25 mg (04/08/2020), 0.25 mg (04/24/2020), 0.25 mg (05/08/2020), 0.25 mg (05/22/2020), 0.25 mg (06/10/2020), 0.25 mg (06/25/2020) leucovorin 800 mg in dextrose 5 % 250 mL infusion, 844 mg, Intravenous,  Once, 23 of 25 cycles Administration: 800 mg (07/24/2019), 800 mg (08/07/2019), 800 mg (08/21/2019), 800 mg (09/04/2019), 800 mg (09/20/2019), 800 mg (10/04/2019), 800 mg (10/16/2019), 800 mg (10/30/2019), 800 mg (11/22/2019), 800 mg (12/06/2019), 800 mg (12/20/2019), 800 mg (01/03/2020), 800 mg (01/29/2020), 800 mg (02/12/2020), 800 mg (02/26/2020), 800 mg (03/11/2020), 800 mg (03/25/2020), 800 mg (04/08/2020), 800 mg (04/24/2020), 800 mg (05/08/2020), 800 mg (05/22/2020), 800 mg (06/10/2020), 800 mg (06/25/2020) oxaliplatin (ELOXATIN) 180 mg in dextrose 5 % 500 mL chemo infusion, 85 mg/m2 = 180 mg, Intravenous,  Once, 23 of 25 cycles Dose modification: 178 mg (original dose 85 mg/m2, Cycle 17, Reason: Other (see comments), Comment: insurance adjusted dose ) Administration: 180 mg (07/24/2019), 180 mg (08/07/2019), 180 mg (08/21/2019), 180 mg (09/04/2019), 180 mg (09/20/2019), 180 mg (10/04/2019), 180 mg (10/16/2019), 180 mg (10/30/2019), 180 mg (11/22/2019), 180 mg (12/06/2019), 180 mg (12/20/2019), 180 mg (01/03/2020), 180 mg (01/29/2020), 180 mg (02/12/2020), 180 mg (02/26/2020), 180  mg (03/11/2020),  180 mg (04/08/2020), 180 mg (04/24/2020), 180 mg (05/08/2020), 180 mg (05/22/2020), 180 mg (06/10/2020), 180 mg (06/25/2020) fluorouracil (ADRUCIL) 5,000 mg in sodium chloride 0.9 % 150 mL chemo infusion, 5,050 mg, Intravenous, 1 Day/Dose, 23 of 25 cycles Administration: 5,000 mg (07/24/2019), 5,000 mg (08/07/2019), 5,000 mg (08/21/2019), 5,050 mg (09/04/2019), 5,000 mg (10/04/2019), 5,000 mg (10/16/2019), 5,000 mg (11/22/2019), 5,000 mg (12/06/2019), 5,000 mg (12/20/2019), 5,000 mg (01/03/2020), 5,000 mg (01/29/2020), 5,000 mg (02/12/2020), 5,000 mg (02/26/2020), 5,000 mg (03/11/2020), 5,000 mg (03/25/2020), 5,000 mg (04/08/2020), 5,000 mg (04/24/2020), 5,000 mg (05/08/2020), 5,000 mg (05/22/2020), 5,000 mg (06/10/2020), 5,000 mg (06/25/2020) bevacizumab-bvzr (ZIRABEV) 400 mg in sodium chloride 0.9 % 100 mL chemo infusion, 5 mg/kg = 400 mg, Intravenous,  Once, 23 of 25 cycles Administration: 400 mg (08/07/2019), 400 mg (08/21/2019), 400 mg (09/04/2019), 400 mg (09/20/2019), 400 mg (10/04/2019), 400 mg (10/16/2019), 400 mg (10/30/2019), 400 mg (11/22/2019), 400 mg (12/06/2019), 400 mg (12/20/2019), 400 mg (01/03/2020), 400 mg (01/29/2020), 400 mg (02/12/2020), 400 mg (02/26/2020), 400 mg (03/11/2020), 400 mg (03/25/2020), 400 mg (04/08/2020), 400 mg (04/24/2020), 400 mg (05/08/2020), 400 mg (05/22/2020), 400 mg (06/10/2020), 400 mg (06/25/2020)  for chemotherapy treatment.    07/08/2020 -  Chemotherapy   The patient had dexamethasone (DECADRON) 4 MG tablet, 8 mg, Oral, Daily, 1 of 1 cycle, Start date: --, End date: -- palonosetron (ALOXI) injection 0.25 mg, 0.25 mg, Intravenous,  Once, 2 of 8 cycles Administration: 0.25 mg (07/08/2020), 0.25 mg (07/22/2020) irinotecan (CAMPTOSAR) 380 mg in sodium chloride 0.9 % 500 mL chemo infusion, 180 mg/m2 = 380 mg, Intravenous,  Once, 2 of 8 cycles Administration: 380 mg (07/08/2020), 380 mg (07/22/2020) fluorouracil (ADRUCIL) 5,000 mg in sodium chloride 0.9 % 150 mL chemo infusion, 2,369 mg/m2 = 5,050 mg, Intravenous, 1  Day/Dose, 2 of 8 cycles Administration: 5,000 mg (07/08/2020), 5,000 mg (07/22/2020) bevacizumab-bvzr (ZIRABEV) 450 mg in sodium chloride 0.9 % 100 mL chemo infusion, 400 mg, Intravenous,  Once, 2 of 8 cycles Administration: 450 mg (07/08/2020), 450 mg (07/22/2020)  for chemotherapy treatment.      HISTORY OF PRESENTING ILLNESS:  Oscar [name] 79 y.o.  male with metastatic colon cancer to the liver currently on FOLFIRI plus Avastin chemotherapy.  Patient denies any diarrhea. Denies any nausea vomiting. Chronic mild fatigue. No weight loss. Chronic mild tingling and numbness in extremities.   Review of Systems  Constitutional: Positive for malaise/fatigue. Negative for chills, diaphoresis, fever and weight loss.  HENT: Negative for nosebleeds and sore throat.   Eyes: Negative for double vision.  Respiratory: Negative for cough, hemoptysis, sputum production, shortness of breath and wheezing.   Cardiovascular: Negative for chest pain, palpitations, orthopnea and leg swelling.  Gastrointestinal: Negative for abdominal pain, blood in stool, constipation, diarrhea, heartburn, melena, nausea and vomiting.  Genitourinary: Negative for dysuria, frequency and urgency.  Musculoskeletal: Positive for back pain and joint pain.  Skin: Negative.  Negative for itching and rash.  Neurological: Positive for tingling. Negative for dizziness, focal weakness, weakness and headaches.  Endo/Heme/Allergies: Does not bruise/bleed easily.  Psychiatric/Behavioral: Negative for depression. The patient is not nervous/anxious and does not have insomnia.      MEDICAL HISTORY:  Past Medical History:  Diagnosis Date  . Arthritis   . Cancer ([place])    colon cancer 02/2019 per pt   . Diabetes mellitus without complication ([place])   . H/O colon cancer, stage IV   . Hyperlipemia   . Hypertension     SURGICAL HISTORY: Past  Surgical History:  Procedure Laterality Date  . IR IMAGING GUIDED PORT INSERTION  07/20/2019   . JOINT REPLACEMENT      SOCIAL HISTORY: Social History   Socioeconomic History  . Marital status: Married    Spouse name: Not on file  . Number of children: Not on file  . Years of education: Not on file  . Highest education level: Not on file  Occupational History  . Not on file  Tobacco Use  . Smoking status: Current Every Day Smoker    Packs/day: 0.25    Types: Cigarettes  . Smokeless tobacco: Never Used  . Tobacco comment: 1 to 2 cigarettes a day occasionally  Vaping Use  . Vaping Use: Never used  Substance and Sexual Activity  . Alcohol use: No  . Drug use: No  . Sexual activity: Not on file  Other Topics Concern  . Not on file  Social History Narrative   Recruitment consultant retd; lives in [place]; smoking 3cig/day; [3/4 ppd x started at 9 years]; no alcohol. Son & daughter; wife dementia [waiting for placement].    Social Determinants of Health   Financial Resource Strain:   . Difficulty of Paying Living Expenses: Not on file  Food Insecurity:   . Worried About Charity fundraiser in the Last Year: Not on file  . Ran Out of Food in the Last Year: Not on file  Transportation Needs:   . Lack of Transportation (Medical): Not on file  . Lack of Transportation (Non-Medical): Not on file  Physical Activity:   . Days of Exercise per Week: Not on file  . Minutes of Exercise per Session: Not on file  Stress:   . Feeling of Stress : Not on file  Social Connections:   . Frequency of Communication with Friends and Family: Not on file  . Frequency of Social Gatherings with Friends and Family: Not on file  . Attends Religious Services: Not on file  . Active Member of Clubs or Organizations: Not on file  . Attends Archivist Meetings: Not on file  . Marital Status: Not on file  Intimate Partner Violence:   . Fear of Current or Ex-Partner: Not on file  . Emotionally Abused: Not on file  . Physically Abused: Not on file  . Sexually Abused: Not on  file    FAMILY HISTORY: Family History  Problem Relation Age of Onset  . Peptic Ulcer Disease Father     ALLERGIES:  is allergic to ace inhibitors.  MEDICATIONS:  Current Outpatient Medications  Medication Sig Dispense Refill  . amLODipine (NORVASC) 5 MG tablet Take 5 mg by mouth daily.    . fluticasone (FLONASE) 50 MCG/ACT nasal spray Place 1 spray into both nostrils daily as needed.     . lidocaine-prilocaine (EMLA) cream Apply 1 application topically as needed. 30 g 0  . meloxicam (MOBIC) 15 MG tablet Take 1 tablet (15 mg total) by mouth daily. 90 tablet 0  . tamsulosin (FLOMAX) 0.4 MG CAPS capsule Take 1 capsule (0.4 mg total) by mouth daily. 30 capsule 11  . diphenoxylate-atropine (LOMOTIL) 2.5-0.025 MG tablet Take 1 tablet by mouth 4 (four) times daily as needed for diarrhea or loose stools. Take it along with immodium (Patient not taking: Reported on 07/22/2020) 60 tablet 0  . montelukast (SINGULAIR) 10 MG tablet TAKE 1 TABLET BY MOUTH EVERYDAY AT BEDTIME (Patient not taking: Reported on 03/25/2020) 90 tablet 1  . ondansetron (ZOFRAN-ODT) 4 MG disintegrating tablet  Take 1 tablet (4 mg total) by mouth every 6 (six) hours as needed for nausea. (Patient not taking: Reported on 12/05/2019) 20 tablet 0  . pravastatin (PRAVACHOL) 40 MG tablet Take 40 mg by mouth daily. (Patient not taking: Reported on 07/22/2020)    . prochlorperazine (COMPAZINE) 10 MG tablet Take 1 tablet (10 mg total) by mouth every 6 (six) hours as needed for nausea or vomiting. (Patient not taking: Reported on 12/05/2019) 40 tablet 1   No current facility-administered medications for this visit.   Facility-Administered Medications Ordered in Other Visits  Medication Dose Route Frequency Provider Last Rate Last Admin  . fluorouracil (ADRUCIL) 5,000 mg in sodium chloride 0.9 % 150 mL chemo infusion  2,369 mg/m2 (Treatment Plan Recorded) Intravenous 1 day or 1 dose Charlaine Dalton R, MD   5,000 mg at 07/22/20 1230       .  PHYSICAL EXAMINATION: ECOG PERFORMANCE STATUS: 0 - Asymptomatic  Vitals:   07/22/20 0845  BP: 136/73  Pulse: 68  Resp: 18  Temp: 98.6 F (37 C)   Filed Weights   07/22/20 0850  Weight: 197 lb 12.8 oz (89.7 kg)    Physical Exam HENT:     Head: Normocephalic and atraumatic.     Mouth/Throat:     Pharynx: No oropharyngeal exudate.  Eyes:     Pupils: Pupils are equal, round, and reactive to light.  Cardiovascular:     Rate and Rhythm: Normal rate and regular rhythm.  Pulmonary:     Effort: No respiratory distress.     Breath sounds: No wheezing.  Abdominal:     General: Bowel sounds are normal. There is no distension.     Palpations: Abdomen is soft. There is no mass.     Tenderness: There is no abdominal tenderness. There is no guarding or rebound.  Musculoskeletal:        General: No tenderness. Normal range of motion.     Cervical back: Normal range of motion and neck supple.  Skin:    General: Skin is warm.  Neurological:     Mental Status: He is alert and oriented to person, place, and time.  Psychiatric:        Mood and Affect: Affect normal.    LABORATORY DATA:  I have reviewed the data as listed Lab Results  Component Value Date   WBC 3.9 (L) 07/22/2020   HGB 12.6 (L) 07/22/2020   HCT 36.2 (L) 07/22/2020   MCV 98.9 07/22/2020   PLT 132 (L) 07/22/2020   Recent Labs    06/25/20 0841 07/08/20 0843 07/22/20 0836  NA 138 139 138  K 3.6 3.6 3.6  CL 108 108 107  CO2 $Re'24 25 25  'waS$ GLUCOSE 201* 209* 195*  BUN $Re'10 9 10  'npe$ CREATININE 0.80 0.70 0.89  CALCIUM 8.0* 8.0* 8.1*  GFRNONAA >60 >60 >60  GFRAA >60 >60 >60  PROT 6.5 6.5 6.7  ALBUMIN 3.2* 3.1* 3.1*  AST 32 31 29  ALT $Re'20 19 19  'emo$ ALKPHOS 121 102 108  BILITOT 0.8 0.7 0.8    RADIOGRAPHIC STUDIES: I have personally reviewed the radiological images as listed and agreed with the findings in the report. No results found.  ASSESSMENT & PLAN:   Cancer of right colon ([place]) #Right-sided colon  adenocarcinoma-with synchronous metastasis to liver/unresectable. CT scan SEP 4th 2021- Progressive disease-left lower lobe lung nodule 8 mm [previously 4 mm]; increase in size of the hepatic metastases by few millimeters.;  Soft tissue nodule  adjacent to anastomotic site again increased by few millimeters.  currently on FOLFIRI + avastin. CEA- SEP -36/rising.   #Proceed with today- of FOLFIRI plus Avastin chemotherapy.  Labs today reviewed;  acceptable for treatment today.   # PN- G-1-2; sec to oxaliplatin.  STABLE.    # HTN- systolic 349Z- STABLE;  recommend monitoring at home.    # DISPOSITION:  #FOLFIRI+ Avastin today; pump off in 2 days. # follow up in 2 weeks/ [ MD; labs- cbc/cmp/UA/ CEA; FOLFIRI + avastin;pump off in 2 days -Dr.B  All questions were answered. The patient knows to call the clinic with any problems, questions or concerns.    Cammie Sickle, MD 07/22/2020 3:48 PM

## 2020-07-22 NOTE — Assessment & Plan Note (Addendum)
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 CT scan SEP 4th 2021- Progressive disease-left lower lobe lung nodule 8 mm [previously 4 mm]; increase in size of the hepatic metastases by few millimeters.;  Soft tissue nodule adjacent to anastomotic site again increased by few millimeters.  currently on FOLFIRI + avastin. CEA- SEP -36/rising.   #Proceed with today- of FOLFIRI plus Avastin chemotherapy.  Labs today reviewed;  acceptable for treatment today.   # PN- G-1-2; sec to oxaliplatin.  STABLE.    # HTN- systolic 142J- STABLE;  recommend monitoring at home.    # DISPOSITION:  #FOLFIRI+ Avastin today; pump off in 2 days. # follow up in 2 weeks/ [ MD; labs- cbc/cmp/UA/ CEA; FOLFIRI + avastin;pump off in 2 days -Dr.B

## 2020-07-23 DIAGNOSIS — C182 Malignant neoplasm of ascending colon: Secondary | ICD-10-CM | POA: Diagnosis not present

## 2020-07-23 LAB — CEA: CEA: 34.6 ng/mL — ABNORMAL HIGH (ref 0.0–4.7)

## 2020-07-24 ENCOUNTER — Other Ambulatory Visit: Payer: Self-pay

## 2020-07-24 ENCOUNTER — Inpatient Hospital Stay: Payer: Medicare HMO

## 2020-07-24 DIAGNOSIS — C182 Malignant neoplasm of ascending colon: Secondary | ICD-10-CM

## 2020-07-24 DIAGNOSIS — Z7189 Other specified counseling: Secondary | ICD-10-CM

## 2020-07-24 DIAGNOSIS — Z5112 Encounter for antineoplastic immunotherapy: Secondary | ICD-10-CM | POA: Diagnosis not present

## 2020-07-24 DIAGNOSIS — Z5111 Encounter for antineoplastic chemotherapy: Secondary | ICD-10-CM | POA: Diagnosis not present

## 2020-07-24 DIAGNOSIS — C787 Secondary malignant neoplasm of liver and intrahepatic bile duct: Secondary | ICD-10-CM | POA: Diagnosis not present

## 2020-07-24 MED ORDER — HEPARIN SOD (PORK) LOCK FLUSH 100 UNIT/ML IV SOLN
500.0000 [IU] | Freq: Once | INTRAVENOUS | Status: AC | PRN
  Administered 2020-07-24: 500 [IU]
  Filled 2020-07-24: qty 5

## 2020-07-24 MED ORDER — HEPARIN SOD (PORK) LOCK FLUSH 100 UNIT/ML IV SOLN
INTRAVENOUS | Status: AC
  Filled 2020-07-24: qty 5

## 2020-07-24 MED ORDER — SODIUM CHLORIDE 0.9% FLUSH
10.0000 mL | INTRAVENOUS | Status: DC | PRN
  Administered 2020-07-24: 10 mL
  Filled 2020-07-24: qty 10

## 2020-08-05 ENCOUNTER — Inpatient Hospital Stay: Payer: Medicare HMO

## 2020-08-05 ENCOUNTER — Encounter: Payer: Self-pay | Admitting: Internal Medicine

## 2020-08-05 ENCOUNTER — Inpatient Hospital Stay: Payer: Medicare HMO | Admitting: Internal Medicine

## 2020-08-05 ENCOUNTER — Other Ambulatory Visit: Payer: Self-pay

## 2020-08-05 DIAGNOSIS — C787 Secondary malignant neoplasm of liver and intrahepatic bile duct: Secondary | ICD-10-CM | POA: Diagnosis not present

## 2020-08-05 DIAGNOSIS — C182 Malignant neoplasm of ascending colon: Secondary | ICD-10-CM

## 2020-08-05 DIAGNOSIS — Z5112 Encounter for antineoplastic immunotherapy: Secondary | ICD-10-CM | POA: Diagnosis not present

## 2020-08-05 DIAGNOSIS — Z5111 Encounter for antineoplastic chemotherapy: Secondary | ICD-10-CM | POA: Diagnosis not present

## 2020-08-05 DIAGNOSIS — Z7189 Other specified counseling: Secondary | ICD-10-CM

## 2020-08-05 LAB — COMPREHENSIVE METABOLIC PANEL
ALT: 42 U/L (ref 0–44)
AST: 59 U/L — ABNORMAL HIGH (ref 15–41)
Albumin: 3 g/dL — ABNORMAL LOW (ref 3.5–5.0)
Alkaline Phosphatase: 118 U/L (ref 38–126)
Anion gap: 5 (ref 5–15)
BUN: 10 mg/dL (ref 8–23)
CO2: 25 mmol/L (ref 22–32)
Calcium: 8 mg/dL — ABNORMAL LOW (ref 8.9–10.3)
Chloride: 109 mmol/L (ref 98–111)
Creatinine, Ser: 0.86 mg/dL (ref 0.61–1.24)
GFR, Estimated: >60 mL/min (ref >60)
Glucose, Bld: 198 mg/dL — ABNORMAL HIGH (ref 70–99)
Potassium: 3.7 mmol/L (ref 3.5–5.1)
Sodium: 139 mmol/L (ref 135–145)
Total Bilirubin: 1 mg/dL (ref 0.3–1.2)
Total Protein: 6.2 g/dL — ABNORMAL LOW (ref 6.5–8.1)

## 2020-08-05 LAB — CBC WITH DIFFERENTIAL/PLATELET
Abs Immature Granulocytes: 0.01 10*3/uL (ref 0.00–0.07)
Basophils Absolute: 0 10*3/uL (ref 0.0–0.1)
Basophils Relative: 1 %
Eosinophils Absolute: 0.3 10*3/uL (ref 0.0–0.5)
Eosinophils Relative: 10 %
HCT: 35.7 % — ABNORMAL LOW (ref 39.0–52.0)
Hemoglobin: 12.2 g/dL — ABNORMAL LOW (ref 13.0–17.0)
Immature Granulocytes: 0 %
Lymphocytes Relative: 30 %
Lymphs Abs: 0.9 10*3/uL (ref 0.7–4.0)
MCH: 34.3 pg — ABNORMAL HIGH (ref 26.0–34.0)
MCHC: 34.2 g/dL (ref 30.0–36.0)
MCV: 100.3 fL — ABNORMAL HIGH (ref 80.0–100.0)
Monocytes Absolute: 0.4 10*3/uL (ref 0.1–1.0)
Monocytes Relative: 12 %
Neutro Abs: 1.4 10*3/uL — ABNORMAL LOW (ref 1.7–7.7)
Neutrophils Relative %: 47 %
Platelets: 118 10*3/uL — ABNORMAL LOW (ref 150–400)
RBC: 3.56 MIL/uL — ABNORMAL LOW (ref 4.22–5.81)
RDW: 14.1 % (ref 11.5–15.5)
WBC: 3 10*3/uL — ABNORMAL LOW (ref 4.0–10.5)
nRBC: 0 % (ref 0.0–0.2)

## 2020-08-05 LAB — URINALYSIS, COMPLETE (UACMP) WITH MICROSCOPIC
Bacteria, UA: NONE SEEN
Bilirubin Urine: NEGATIVE
Glucose, UA: 50 mg/dL — AB
Hgb urine dipstick: NEGATIVE
Ketones, ur: NEGATIVE mg/dL
Leukocytes,Ua: NEGATIVE
Nitrite: NEGATIVE
Protein, ur: 30 mg/dL — AB
Specific Gravity, Urine: 1.028 (ref 1.005–1.030)
pH: 5 (ref 5.0–8.0)

## 2020-08-05 MED ORDER — SODIUM CHLORIDE 0.9 % IV SOLN
Freq: Once | INTRAVENOUS | Status: AC
  Filled 2020-08-05: qty 250

## 2020-08-05 MED ORDER — SODIUM CHLORIDE 0.9 % IV SOLN
10.0000 mg | Freq: Once | INTRAVENOUS | Status: AC
  Administered 2020-08-05: 10 mg via INTRAVENOUS
  Filled 2020-08-05: qty 10

## 2020-08-05 MED ORDER — DIPHENOXYLATE-ATROPINE 2.5-0.025 MG PO TABS
1.0000 | ORAL_TABLET | Freq: Once | ORAL | Status: AC
  Administered 2020-08-05: 1 via ORAL
  Filled 2020-08-05: qty 1

## 2020-08-05 MED ORDER — PROCHLORPERAZINE MALEATE 10 MG PO TABS
10.0000 mg | ORAL_TABLET | Freq: Once | ORAL | Status: AC
  Administered 2020-08-05: 10 mg via ORAL
  Filled 2020-08-05: qty 1

## 2020-08-05 MED ORDER — SODIUM CHLORIDE 0.9 % IV SOLN
5.0000 mg/kg | Freq: Once | INTRAVENOUS | Status: AC
  Administered 2020-08-05: 400 mg via INTRAVENOUS
  Filled 2020-08-05: qty 16

## 2020-08-05 MED ORDER — PALONOSETRON HCL INJECTION 0.25 MG/5ML
0.2500 mg | Freq: Once | INTRAVENOUS | Status: AC
  Administered 2020-08-05: 0.25 mg via INTRAVENOUS
  Filled 2020-08-05: qty 5

## 2020-08-05 MED ORDER — SODIUM CHLORIDE 0.9% FLUSH
10.0000 mL | INTRAVENOUS | Status: DC | PRN
  Administered 2020-08-05: 10 mL via INTRAVENOUS
  Filled 2020-08-05: qty 10

## 2020-08-05 MED ORDER — SODIUM CHLORIDE 0.9 % IV SOLN
180.0000 mg/m2 | Freq: Once | INTRAVENOUS | Status: AC
  Administered 2020-08-05: 380 mg via INTRAVENOUS
  Filled 2020-08-05: qty 15

## 2020-08-05 MED ORDER — SODIUM CHLORIDE 0.9 % IV SOLN
5000.0000 mg | INTRAVENOUS | Status: DC
  Administered 2020-08-05: 5000 mg via INTRAVENOUS
  Filled 2020-08-05: qty 100

## 2020-08-05 NOTE — Assessment & Plan Note (Addendum)
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 CT scan SEP 4th 2021- Progressive disease-left lower lobe lung nodule 8 mm [previously 4 mm]; increase in size of the hepatic metastases by few millimeters.;  Soft tissue nodule adjacent to anastomotic site again increased by few millimeters.  currently on FOLFIRI + avastin. CEA- SEP -36/rising. Clinically STABLE.   #Proceed with today- of FOLFIRI plus Avastin chemotherapy.  Labs today reviewed;  acceptable for treatment today. Will plan CT scan after 4-6 cycles; discussed with patient.   # Nausea/vomitting- [> 12 days post chemo]- sec to dietary; unlikely from chemo; continue antiemetics as needed.  Monitor closely.   # PN- G-1-2; sec to oxaliplatin.  STABLE    # DISPOSITION:  #FOLFIRI+ Avastin today; pump off in 2 days. # follow up in 2 weeks/ [ MD; labs- cbc/cmp/UA/ CEA; FOLFIRI + avastin;pump off in 2 days -Dr.B

## 2020-08-05 NOTE — Progress Notes (Signed)
Made mention that on this past Saturday had an episode of feeling swimmy headed and sick on his stomach all of a sudden. States he threw up 6 times.

## 2020-08-05 NOTE — Progress Notes (Signed)
Patient feeling nauseated. Compazine 10mg  PO ordered per Dr Rogue Bussing. Will continue to monitor

## 2020-08-05 NOTE — Progress Notes (Signed)
[place] NOTE  Patient Care Team: Sofie Hartigan, MD as PCP - General (Family Medicine) [name], MD as Consulting Physician (Hematology and Oncology)  CHIEF COMPLAINTS/PURPOSE OF CONSULTATION: Colon cancer  #  Oncology History Overview Note  # MAY 2020- 3. 03/09/19 Liver biopsy. Microscopic examination shows malignant cells with glandular architecture consistent with adenocarcinoma. The malignant cells are positive for CK20 and CDX-2. These findings support the clinical impression of metastatic colon adenocarcinoma. 4. 03/10/19 R hemicolectomy. Tumor site cecum. Adenocarcinoma. Mucinous features present. G2. No tumor deposits. Invades visceral peritoneum. No tumor perforation. LVI present. PNI not identified. All margins uninvolved. 1/12 LNs. PT4apN1. Periappendiceal inflammation c/w resolving abscess. Microsatellite stable (MSS). [Dr.Mettu; [facility]]  # SEP 4th 2020 [compared to May 2020]  Interval increase in size of the metastases to the hepatic dome, The metastasis to the left hepatic lobe is unchanged; 2.  New subcentimeter hypoattenuating lesion in the inferior right hepatic lobe, incompletely characterized on CT. 3.  Postsurgical changes following right hemicolectomy.  # OCT 2020- FOLFOX +avastin; CT dec 22nd 2020- [compared to [facility] sep 9th 2020]-Liver- slight progression versus stable disease; CT scan SEP 4th 2021- Progressive disease-left lower lobe lung nodule 8 mm [previously 4 mm]; increase in size of the hepatic metastases by few millimeters.;  Soft tissue nodule adjacent to anastomotic site again increased by few millimeters.STOP FOLFOX; cont avastin  #  SEP 20th, 2021- FOLFIRI+ AVASTIN  # NGS/F-ONE-MUTATED K-RAS [G]  # PALLIATIVE CARE EVALUATION: 09/20/2019-Oscar [name]  # PAIN MANAGEMENT: NA   DIAGNOSIS: COLON CANCER  STAGE:  IV     ;  GOALS:Palliative  CURRENT/MOST RECENT THERAPY : FOLFIRI+ avastin [C]    Cancer of right colon ([place])  07/05/2019  Initial Diagnosis   Cancer of right colon ([place])   07/24/2019 - 06/25/2020 Chemotherapy   The patient had dexamethasone (DECADRON) 4 MG tablet, 8 mg, Oral, Daily, 1 of 1 cycle, Start date: --, End date: -- palonosetron (ALOXI) injection 0.25 mg, 0.25 mg, Intravenous,  Once, 23 of 25 cycles Administration: 0.25 mg (07/24/2019), 0.25 mg (08/07/2019), 0.25 mg (08/21/2019), 0.25 mg (09/04/2019), 0.25 mg (09/20/2019), 0.25 mg (10/04/2019), 0.25 mg (10/16/2019), 0.25 mg (10/30/2019), 0.25 mg (11/22/2019), 0.25 mg (12/06/2019), 0.25 mg (12/20/2019), 0.25 mg (01/03/2020), 0.25 mg (01/29/2020), 0.25 mg (02/12/2020), 0.25 mg (02/26/2020), 0.25 mg (03/11/2020), 0.25 mg (03/25/2020), 0.25 mg (04/08/2020), 0.25 mg (04/24/2020), 0.25 mg (05/08/2020), 0.25 mg (05/22/2020), 0.25 mg (06/10/2020), 0.25 mg (06/25/2020) leucovorin 800 mg in dextrose 5 % 250 mL infusion, 844 mg, Intravenous,  Once, 23 of 25 cycles Administration: 800 mg (07/24/2019), 800 mg (08/07/2019), 800 mg (08/21/2019), 800 mg (09/04/2019), 800 mg (09/20/2019), 800 mg (10/04/2019), 800 mg (10/16/2019), 800 mg (10/30/2019), 800 mg (11/22/2019), 800 mg (12/06/2019), 800 mg (12/20/2019), 800 mg (01/03/2020), 800 mg (01/29/2020), 800 mg (02/12/2020), 800 mg (02/26/2020), 800 mg (03/11/2020), 800 mg (03/25/2020), 800 mg (04/08/2020), 800 mg (04/24/2020), 800 mg (05/08/2020), 800 mg (05/22/2020), 800 mg (06/10/2020), 800 mg (06/25/2020) oxaliplatin (ELOXATIN) 180 mg in dextrose 5 % 500 mL chemo infusion, 85 mg/m2 = 180 mg, Intravenous,  Once, 23 of 25 cycles Dose modification: 178 mg (original dose 85 mg/m2, Cycle 17, Reason: Other (see comments), Comment: insurance adjusted dose ) Administration: 180 mg (07/24/2019), 180 mg (08/07/2019), 180 mg (08/21/2019), 180 mg (09/04/2019), 180 mg (09/20/2019), 180 mg (10/04/2019), 180 mg (10/16/2019), 180 mg (10/30/2019), 180 mg (11/22/2019), 180 mg (12/06/2019), 180 mg (12/20/2019), 180 mg (01/03/2020), 180 mg (01/29/2020), 180 mg (02/12/2020), 180 mg (02/26/2020), 180  mg (03/11/2020),  180 mg (04/08/2020), 180 mg (04/24/2020), 180 mg (05/08/2020), 180 mg (05/22/2020), 180 mg (06/10/2020), 180 mg (06/25/2020) fluorouracil (ADRUCIL) 5,000 mg in sodium chloride 0.9 % 150 mL chemo infusion, 5,050 mg, Intravenous, 1 Day/Dose, 23 of 25 cycles Administration: 5,000 mg (07/24/2019), 5,000 mg (08/07/2019), 5,000 mg (08/21/2019), 5,050 mg (09/04/2019), 5,000 mg (10/04/2019), 5,000 mg (10/16/2019), 5,000 mg (11/22/2019), 5,000 mg (12/06/2019), 5,000 mg (12/20/2019), 5,000 mg (01/03/2020), 5,000 mg (01/29/2020), 5,000 mg (02/12/2020), 5,000 mg (02/26/2020), 5,000 mg (03/11/2020), 5,000 mg (03/25/2020), 5,000 mg (04/08/2020), 5,000 mg (04/24/2020), 5,000 mg (05/08/2020), 5,000 mg (05/22/2020), 5,000 mg (06/10/2020), 5,000 mg (06/25/2020) bevacizumab-bvzr (ZIRABEV) 400 mg in sodium chloride 0.9 % 100 mL chemo infusion, 5 mg/kg = 400 mg, Intravenous,  Once, 23 of 25 cycles Administration: 400 mg (08/07/2019), 400 mg (08/21/2019), 400 mg (09/04/2019), 400 mg (09/20/2019), 400 mg (10/04/2019), 400 mg (10/16/2019), 400 mg (10/30/2019), 400 mg (11/22/2019), 400 mg (12/06/2019), 400 mg (12/20/2019), 400 mg (01/03/2020), 400 mg (01/29/2020), 400 mg (02/12/2020), 400 mg (02/26/2020), 400 mg (03/11/2020), 400 mg (03/25/2020), 400 mg (04/08/2020), 400 mg (04/24/2020), 400 mg (05/08/2020), 400 mg (05/22/2020), 400 mg (06/10/2020), 400 mg (06/25/2020)  for chemotherapy treatment.    07/08/2020 -  Chemotherapy   The patient had dexamethasone (DECADRON) 4 MG tablet, 8 mg, Oral, Daily, 1 of 1 cycle, Start date: --, End date: -- palonosetron (ALOXI) injection 0.25 mg, 0.25 mg, Intravenous,  Once, 3 of 8 cycles Administration: 0.25 mg (07/08/2020), 0.25 mg (07/22/2020) irinotecan (CAMPTOSAR) 380 mg in sodium chloride 0.9 % 500 mL chemo infusion, 180 mg/m2 = 380 mg, Intravenous,  Once, 3 of 8 cycles Administration: 380 mg (07/08/2020), 380 mg (07/22/2020) fluorouracil (ADRUCIL) 5,000 mg in sodium chloride 0.9 % 150 mL chemo infusion, 2,369 mg/m2 = 5,050 mg, Intravenous, 1  Day/Dose, 3 of 8 cycles Administration: 5,000 mg (07/08/2020), 5,000 mg (07/22/2020) bevacizumab-bvzr (ZIRABEV) 450 mg in sodium chloride 0.9 % 100 mL chemo infusion, 400 mg, Intravenous,  Once, 3 of 8 cycles Administration: 450 mg (07/08/2020), 450 mg (07/22/2020)  for chemotherapy treatment.      HISTORY OF PRESENTING ILLNESS:  Oscar [name] 79 y.o.  male with metastatic colon cancer to the liver currently on FOLFIRI plus Avastin chemotherapy.  Approximately 10 days after last chemotherapy patient noted to have an episode of nausea vomiting 6-7 episodes.  No diarrhea.  No abdominal pain.  Resolved after taking antiemetic.  Continues to have chronic mild tingling and numbness in extremities.  Mild weight loss.  Otherwise no chest pain or shortness of breath.  Review of Systems  Constitutional: Positive for malaise/fatigue. Negative for chills, diaphoresis, fever and weight loss.  HENT: Negative for nosebleeds and sore throat.   Eyes: Negative for double vision.  Respiratory: Negative for cough, hemoptysis, sputum production, shortness of breath and wheezing.   Cardiovascular: Negative for chest pain, palpitations, orthopnea and leg swelling.  Gastrointestinal: Negative for abdominal pain, blood in stool, constipation, diarrhea, heartburn, melena, nausea and vomiting.  Genitourinary: Negative for dysuria, frequency and urgency.  Musculoskeletal: Positive for back pain and joint pain.  Skin: Negative.  Negative for itching and rash.  Neurological: Positive for tingling. Negative for dizziness, focal weakness, weakness and headaches.  Endo/Heme/Allergies: Does not bruise/bleed easily.  Psychiatric/Behavioral: Negative for depression. The patient is not nervous/anxious and does not have insomnia.      MEDICAL HISTORY:  Past Medical History:  Diagnosis Date  . Arthritis   . Cancer ([place])    colon cancer 02/2019 per pt   .  Diabetes mellitus without complication ([place])   . H/O colon  cancer, stage IV   . Hyperlipemia   . Hypertension     SURGICAL HISTORY: Past Surgical History:  Procedure Laterality Date  . IR IMAGING GUIDED PORT INSERTION  07/20/2019  . JOINT REPLACEMENT      SOCIAL HISTORY: Social History   Socioeconomic History  . Marital status: Married    Spouse name: Not on file  . Number of children: Not on file  . Years of education: Not on file  . Highest education level: Not on file  Occupational History  . Not on file  Tobacco Use  . Smoking status: Current Every Day Smoker    Packs/day: 0.25    Types: Cigarettes  . Smokeless tobacco: Never Used  . Tobacco comment: 1 to 2 cigarettes a day occasionally  Vaping Use  . Vaping Use: Never used  Substance and Sexual Activity  . Alcohol use: No  . Drug use: No  . Sexual activity: Not on file  Other Topics Concern  . Not on file  Social History Narrative   Recruitment consultant retd; lives in [place]; smoking 3cig/day; [3/4 ppd x started at 9 years]; no alcohol. Son & daughter; wife dementia [waiting for placement].    Social Determinants of Health   Financial Resource Strain:   . Difficulty of Paying Living Expenses: Not on file  Food Insecurity:   . Worried About Charity fundraiser in the Last Year: Not on file  . Ran Out of Food in the Last Year: Not on file  Transportation Needs:   . Lack of Transportation (Medical): Not on file  . Lack of Transportation (Non-Medical): Not on file  Physical Activity:   . Days of Exercise per Week: Not on file  . Minutes of Exercise per Session: Not on file  Stress:   . Feeling of Stress : Not on file  Social Connections:   . Frequency of Communication with Friends and Family: Not on file  . Frequency of Social Gatherings with Friends and Family: Not on file  . Attends Religious Services: Not on file  . Active Member of Clubs or Organizations: Not on file  . Attends Archivist Meetings: Not on file  . Marital Status: Not on  file  Intimate Partner Violence:   . Fear of Current or Ex-Partner: Not on file  . Emotionally Abused: Not on file  . Physically Abused: Not on file  . Sexually Abused: Not on file    FAMILY HISTORY: Family History  Problem Relation Age of Onset  . Peptic Ulcer Disease Father     ALLERGIES:  is allergic to ace inhibitors.  MEDICATIONS:  Current Outpatient Medications  Medication Sig Dispense Refill  . amLODipine (NORVASC) 5 MG tablet Take 5 mg by mouth daily.    . fluticasone (FLONASE) 50 MCG/ACT nasal spray Place 1 spray into both nostrils daily as needed.     . lidocaine-prilocaine (EMLA) cream Apply 1 application topically as needed. 30 g 0  . meloxicam (MOBIC) 15 MG tablet Take 1 tablet (15 mg total) by mouth daily. 90 tablet 0  . pravastatin (PRAVACHOL) 40 MG tablet Take 40 mg by mouth daily.     . tamsulosin (FLOMAX) 0.4 MG CAPS capsule Take 1 capsule (0.4 mg total) by mouth daily. 30 capsule 11  . diphenoxylate-atropine (LOMOTIL) 2.5-0.025 MG tablet Take 1 tablet by mouth 4 (four) times daily as needed for diarrhea or loose stools. Take it  along with immodium (Patient not taking: Reported on 07/22/2020) 60 tablet 0  . montelukast (SINGULAIR) 10 MG tablet TAKE 1 TABLET BY MOUTH EVERYDAY AT BEDTIME (Patient not taking: Reported on 03/25/2020) 90 tablet 1  . ondansetron (ZOFRAN-ODT) 4 MG disintegrating tablet Take 1 tablet (4 mg total) by mouth every 6 (six) hours as needed for nausea. (Patient not taking: Reported on 12/05/2019) 20 tablet 0  . prochlorperazine (COMPAZINE) 10 MG tablet Take 1 tablet (10 mg total) by mouth every 6 (six) hours as needed for nausea or vomiting. (Patient not taking: Reported on 12/05/2019) 40 tablet 1   No current facility-administered medications for this visit.   Facility-Administered Medications Ordered in Other Visits  Medication Dose Route Frequency Provider Last Rate Last Admin  . fluorouracil (ADRUCIL) 5,000 mg in sodium chloride 0.9 % 150 mL  chemo infusion  5,000 mg Intravenous 1 day or 1 dose Charlaine Dalton R, MD      . irinotecan (CAMPTOSAR) 380 mg in sodium chloride 0.9 % 500 mL chemo infusion  180 mg/m2 (Treatment Plan Recorded) Intravenous Once Cammie Sickle, MD 346 mL/hr at 08/05/20 1101 380 mg at 08/05/20 1101  . sodium chloride flush (NS) 0.9 % injection 10 mL  10 mL Intravenous PRN Cammie Sickle, MD   10 mL at 08/05/20 0828      .  PHYSICAL EXAMINATION: ECOG PERFORMANCE STATUS: 0 - Asymptomatic  Vitals:   08/05/20 0831  BP: 117/78  Pulse: 65  Resp: 16  Temp: (!) 97.4 F (36.3 C)  SpO2: 100%   Filed Weights   08/05/20 0831  Weight: 195 lb (88.5 kg)    Physical Exam HENT:     Head: Normocephalic and atraumatic.     Mouth/Throat:     Pharynx: No oropharyngeal exudate.  Eyes:     Pupils: Pupils are equal, round, and reactive to light.  Cardiovascular:     Rate and Rhythm: Normal rate and regular rhythm.  Pulmonary:     Effort: No respiratory distress.     Breath sounds: No wheezing.  Abdominal:     General: Bowel sounds are normal. There is no distension.     Palpations: Abdomen is soft. There is no mass.     Tenderness: There is no abdominal tenderness. There is no guarding or rebound.  Musculoskeletal:        General: No tenderness. Normal range of motion.     Cervical back: Normal range of motion and neck supple.  Skin:    General: Skin is warm.  Neurological:     Mental Status: He is alert and oriented to person, place, and time.  Psychiatric:        Mood and Affect: Affect normal.    LABORATORY DATA:  I have reviewed the data as listed Lab Results  Component Value Date   WBC 3.0 (L) 08/05/2020   HGB 12.2 (L) 08/05/2020   HCT 35.7 (L) 08/05/2020   MCV 100.3 (H) 08/05/2020   PLT 118 (L) 08/05/2020   Recent Labs    06/25/20 0841 06/25/20 0841 07/08/20 0843 07/22/20 0836 08/05/20 0821  NA 138   < > 139 138 139  K 3.6   < > 3.6 3.6 3.7  CL 108   < > 108 107  109  CO2 24   < > $R'25 25 25  'nZ$ GLUCOSE 201*   < > 209* 195* 198*  BUN 10   < > $R'9 10 10  'Lc$ CREATININE 0.80   < >  0.70 0.89 0.86  CALCIUM 8.0*   < > 8.0* 8.1* 8.0*  GFRNONAA >60   < > >60 >60 >60  GFRAA >60  --  >60 >60  --   PROT 6.5   < > 6.5 6.7 6.2*  ALBUMIN 3.2*   < > 3.1* 3.1* 3.0*  AST 32   < > 31 29 59*  ALT 20   < > 19 19 42  ALKPHOS 121   < > 102 108 118  BILITOT 0.8   < > 0.7 0.8 1.0   < > = values in this interval not displayed.    RADIOGRAPHIC STUDIES: I have personally reviewed the radiological images as listed and agreed with the findings in the report. No results found.  ASSESSMENT & PLAN:   Cancer of right colon ([place]) #Right-sided colon adenocarcinoma-with synchronous metastasis to liver/unresectable. CT scan SEP 4th 2021- Progressive disease-left lower lobe lung nodule 8 mm [previously 4 mm]; increase in size of the hepatic metastases by few millimeters.;  Soft tissue nodule adjacent to anastomotic site again increased by few millimeters.  currently on FOLFIRI + avastin. CEA- SEP -36/rising. Clinically STABLE.   #Proceed with today- of FOLFIRI plus Avastin chemotherapy.  Labs today reviewed;  acceptable for treatment today. Will plan CT scan after 4-6 cycles; discussed with patient.   # Nausea/vomitting- [> 12 days post chemo]- sec to dietary; unlikely from chemo; continue antiemetics as needed.  Monitor closely.   # PN- G-1-2; sec to oxaliplatin.  STABLE    # DISPOSITION:  #FOLFIRI+ Avastin today; pump off in 2 days. # follow up in 2 weeks/ [ MD; labs- cbc/cmp/UA/ CEA; FOLFIRI + avastin;pump off in 2 days -Dr.B  All questions were answered. The patient knows to call the clinic with any problems, questions or concerns.    Cammie Sickle, MD 08/05/2020 11:07 AM

## 2020-08-06 LAB — CEA: CEA: 30.4 ng/mL — ABNORMAL HIGH (ref 0.0–4.7)

## 2020-08-07 ENCOUNTER — Inpatient Hospital Stay: Payer: Medicare HMO

## 2020-08-07 ENCOUNTER — Other Ambulatory Visit: Payer: Self-pay

## 2020-08-07 DIAGNOSIS — C787 Secondary malignant neoplasm of liver and intrahepatic bile duct: Secondary | ICD-10-CM | POA: Diagnosis not present

## 2020-08-07 DIAGNOSIS — Z7189 Other specified counseling: Secondary | ICD-10-CM

## 2020-08-07 DIAGNOSIS — C182 Malignant neoplasm of ascending colon: Secondary | ICD-10-CM | POA: Diagnosis not present

## 2020-08-07 DIAGNOSIS — Z5112 Encounter for antineoplastic immunotherapy: Secondary | ICD-10-CM | POA: Diagnosis not present

## 2020-08-07 DIAGNOSIS — Z5111 Encounter for antineoplastic chemotherapy: Secondary | ICD-10-CM | POA: Diagnosis not present

## 2020-08-07 MED ORDER — HEPARIN SOD (PORK) LOCK FLUSH 100 UNIT/ML IV SOLN
INTRAVENOUS | Status: AC
  Filled 2020-08-07: qty 5

## 2020-08-07 MED ORDER — SODIUM CHLORIDE 0.9% FLUSH
10.0000 mL | INTRAVENOUS | Status: DC | PRN
  Administered 2020-08-07: 10 mL
  Filled 2020-08-07: qty 10

## 2020-08-07 MED ORDER — HEPARIN SOD (PORK) LOCK FLUSH 100 UNIT/ML IV SOLN
500.0000 [IU] | Freq: Once | INTRAVENOUS | Status: AC | PRN
  Administered 2020-08-07: 500 [IU]
  Filled 2020-08-07: qty 5

## 2020-08-19 ENCOUNTER — Other Ambulatory Visit: Payer: Self-pay

## 2020-08-19 ENCOUNTER — Inpatient Hospital Stay (HOSPITAL_BASED_OUTPATIENT_CLINIC_OR_DEPARTMENT_OTHER): Payer: Medicare HMO | Admitting: Internal Medicine

## 2020-08-19 ENCOUNTER — Inpatient Hospital Stay: Payer: Medicare HMO | Attending: Oncology

## 2020-08-19 ENCOUNTER — Inpatient Hospital Stay: Payer: Medicare HMO

## 2020-08-19 ENCOUNTER — Encounter: Payer: Self-pay | Admitting: Internal Medicine

## 2020-08-19 DIAGNOSIS — Z5111 Encounter for antineoplastic chemotherapy: Secondary | ICD-10-CM | POA: Diagnosis not present

## 2020-08-19 DIAGNOSIS — C182 Malignant neoplasm of ascending colon: Secondary | ICD-10-CM

## 2020-08-19 DIAGNOSIS — C787 Secondary malignant neoplasm of liver and intrahepatic bile duct: Secondary | ICD-10-CM | POA: Insufficient documentation

## 2020-08-19 DIAGNOSIS — Z5112 Encounter for antineoplastic immunotherapy: Secondary | ICD-10-CM | POA: Diagnosis not present

## 2020-08-19 DIAGNOSIS — Z7189 Other specified counseling: Secondary | ICD-10-CM

## 2020-08-19 LAB — COMPREHENSIVE METABOLIC PANEL
ALT: 25 U/L (ref 0–44)
AST: 35 U/L (ref 15–41)
Albumin: 3 g/dL — ABNORMAL LOW (ref 3.5–5.0)
Alkaline Phosphatase: 108 U/L (ref 38–126)
Anion gap: 5 (ref 5–15)
BUN: 10 mg/dL (ref 8–23)
CO2: 26 mmol/L (ref 22–32)
Calcium: 8.2 mg/dL — ABNORMAL LOW (ref 8.9–10.3)
Chloride: 108 mmol/L (ref 98–111)
Creatinine, Ser: 0.81 mg/dL (ref 0.61–1.24)
GFR, Estimated: >60 mL/min (ref >60)
Glucose, Bld: 182 mg/dL — ABNORMAL HIGH (ref 70–99)
Potassium: 3.7 mmol/L (ref 3.5–5.1)
Sodium: 139 mmol/L (ref 135–145)
Total Bilirubin: 0.7 mg/dL (ref 0.3–1.2)
Total Protein: 6.1 g/dL — ABNORMAL LOW (ref 6.5–8.1)

## 2020-08-19 LAB — URINALYSIS, COMPLETE (UACMP) WITH MICROSCOPIC
Bacteria, UA: NONE SEEN
Bilirubin Urine: NEGATIVE
Glucose, UA: 100 mg/dL — AB
Hgb urine dipstick: NEGATIVE
Ketones, ur: NEGATIVE mg/dL
Leukocytes,Ua: NEGATIVE
Nitrite: NEGATIVE
Protein, ur: NEGATIVE mg/dL
Specific Gravity, Urine: 1.02 (ref 1.005–1.030)
pH: 7 (ref 5.0–8.0)

## 2020-08-19 LAB — CBC WITH DIFFERENTIAL/PLATELET
Abs Immature Granulocytes: 0 10*3/uL (ref 0.00–0.07)
Basophils Absolute: 0 10*3/uL (ref 0.0–0.1)
Basophils Relative: 1 %
Eosinophils Absolute: 0.2 10*3/uL (ref 0.0–0.5)
Eosinophils Relative: 7 %
HCT: 36.1 % — ABNORMAL LOW (ref 39.0–52.0)
Hemoglobin: 12.3 g/dL — ABNORMAL LOW (ref 13.0–17.0)
Immature Granulocytes: 0 %
Lymphocytes Relative: 33 %
Lymphs Abs: 1.1 10*3/uL (ref 0.7–4.0)
MCH: 34.6 pg — ABNORMAL HIGH (ref 26.0–34.0)
MCHC: 34.1 g/dL (ref 30.0–36.0)
MCV: 101.4 fL — ABNORMAL HIGH (ref 80.0–100.0)
Monocytes Absolute: 0.4 10*3/uL (ref 0.1–1.0)
Monocytes Relative: 13 %
Neutro Abs: 1.5 10*3/uL — ABNORMAL LOW (ref 1.7–7.7)
Neutrophils Relative %: 46 %
Platelets: 121 10*3/uL — ABNORMAL LOW (ref 150–400)
RBC: 3.56 MIL/uL — ABNORMAL LOW (ref 4.22–5.81)
RDW: 14 % (ref 11.5–15.5)
WBC: 3.3 10*3/uL — ABNORMAL LOW (ref 4.0–10.5)
nRBC: 0 % (ref 0.0–0.2)

## 2020-08-19 MED ORDER — DIPHENOXYLATE-ATROPINE 2.5-0.025 MG PO TABS
1.0000 | ORAL_TABLET | Freq: Once | ORAL | Status: AC
  Administered 2020-08-19: 1 via ORAL
  Filled 2020-08-19: qty 1

## 2020-08-19 MED ORDER — SODIUM CHLORIDE 0.9 % IV SOLN
10.0000 mg | Freq: Once | INTRAVENOUS | Status: AC
  Administered 2020-08-19: 10 mg via INTRAVENOUS
  Filled 2020-08-19: qty 10

## 2020-08-19 MED ORDER — SODIUM CHLORIDE 0.9 % IV SOLN
Freq: Once | INTRAVENOUS | Status: AC
  Filled 2020-08-19: qty 250

## 2020-08-19 MED ORDER — SODIUM CHLORIDE 0.9 % IV SOLN
180.0000 mg/m2 | Freq: Once | INTRAVENOUS | Status: AC
  Administered 2020-08-19: 380 mg via INTRAVENOUS
  Filled 2020-08-19: qty 15

## 2020-08-19 MED ORDER — SODIUM CHLORIDE 0.9 % IV SOLN
5000.0000 mg | INTRAVENOUS | Status: DC
  Administered 2020-08-19: 5000 mg via INTRAVENOUS
  Filled 2020-08-19: qty 100

## 2020-08-19 MED ORDER — PALONOSETRON HCL INJECTION 0.25 MG/5ML
0.2500 mg | Freq: Once | INTRAVENOUS | Status: AC
  Administered 2020-08-19: 0.25 mg via INTRAVENOUS
  Filled 2020-08-19: qty 5

## 2020-08-19 MED ORDER — SODIUM CHLORIDE 0.9 % IV SOLN
5.0000 mg/kg | Freq: Once | INTRAVENOUS | Status: AC
  Administered 2020-08-19: 400 mg via INTRAVENOUS
  Filled 2020-08-19: qty 16

## 2020-08-19 NOTE — Progress Notes (Signed)
[place] NOTE  Patient Care Team: Sofie Hartigan, MD as PCP - General (Family Medicine) [name], MD as Consulting Physician (Hematology and Oncology)  CHIEF COMPLAINTS/PURPOSE OF CONSULTATION: Colon cancer  #  Oncology History Overview Note  # MAY 2020- 3. 03/09/19 Liver biopsy. Microscopic examination shows malignant cells with glandular architecture consistent with adenocarcinoma. The malignant cells are positive for CK20 and CDX-2. These findings support the clinical impression of metastatic colon adenocarcinoma. 4. 03/10/19 R hemicolectomy. Tumor site cecum. Adenocarcinoma. Mucinous features present. G2. No tumor deposits. Invades visceral peritoneum. No tumor perforation. LVI present. PNI not identified. All margins uninvolved. 1/12 LNs. PT4apN1. Periappendiceal inflammation c/w resolving abscess. Microsatellite stable (MSS). [Dr.Mettu; [facility]]  # SEP 4th 2020 [compared to May 2020]  Interval increase in size of the metastases to the hepatic dome, The metastasis to the left hepatic lobe is unchanged; 2.  New subcentimeter hypoattenuating lesion in the inferior right hepatic lobe, incompletely characterized on CT. 3.  Postsurgical changes following right hemicolectomy.  # OCT 2020- FOLFOX +avastin; CT dec 22nd 2020- [compared to [facility] sep 9th 2020]-Liver- slight progression versus stable disease; CT scan SEP 4th 2021- Progressive disease-left lower lobe lung nodule 8 mm [previously 4 mm]; increase in size of the hepatic metastases by few millimeters.;  Soft tissue nodule adjacent to anastomotic site again increased by few millimeters.STOP FOLFOX; cont avastin  #  SEP 20th, 2021- FOLFIRI+ AVASTIN  # NGS/F-ONE-MUTATED K-RAS [G]  # PALLIATIVE CARE EVALUATION: 09/20/2019-[name]  # PAIN MANAGEMENT: NA   DIAGNOSIS: COLON CANCER  STAGE:  IV     ;  GOALS:Palliative  CURRENT/MOST RECENT THERAPY : FOLFIRI+ avastin [C]    Cancer of right colon ([place])  07/05/2019  Initial Diagnosis   Cancer of right colon ([place])   07/24/2019 - 06/25/2020 Chemotherapy   The patient had dexamethasone (DECADRON) 4 MG tablet, 8 mg, Oral, Daily, 1 of 1 cycle, Start date: --, End date: -- palonosetron (ALOXI) injection 0.25 mg, 0.25 mg, Intravenous,  Once, 23 of 25 cycles Administration: 0.25 mg (07/24/2019), 0.25 mg (08/07/2019), 0.25 mg (08/21/2019), 0.25 mg (09/04/2019), 0.25 mg (09/20/2019), 0.25 mg (10/04/2019), 0.25 mg (10/16/2019), 0.25 mg (10/30/2019), 0.25 mg (11/22/2019), 0.25 mg (12/06/2019), 0.25 mg (12/20/2019), 0.25 mg (01/03/2020), 0.25 mg (01/29/2020), 0.25 mg (02/12/2020), 0.25 mg (02/26/2020), 0.25 mg (03/11/2020), 0.25 mg (03/25/2020), 0.25 mg (04/08/2020), 0.25 mg (04/24/2020), 0.25 mg (05/08/2020), 0.25 mg (05/22/2020), 0.25 mg (06/10/2020), 0.25 mg (06/25/2020) leucovorin 800 mg in dextrose 5 % 250 mL infusion, 844 mg, Intravenous,  Once, 23 of 25 cycles Administration: 800 mg (07/24/2019), 800 mg (08/07/2019), 800 mg (08/21/2019), 800 mg (09/04/2019), 800 mg (09/20/2019), 800 mg (10/04/2019), 800 mg (10/16/2019), 800 mg (10/30/2019), 800 mg (11/22/2019), 800 mg (12/06/2019), 800 mg (12/20/2019), 800 mg (01/03/2020), 800 mg (01/29/2020), 800 mg (02/12/2020), 800 mg (02/26/2020), 800 mg (03/11/2020), 800 mg (03/25/2020), 800 mg (04/08/2020), 800 mg (04/24/2020), 800 mg (05/08/2020), 800 mg (05/22/2020), 800 mg (06/10/2020), 800 mg (06/25/2020) oxaliplatin (ELOXATIN) 180 mg in dextrose 5 % 500 mL chemo infusion, 85 mg/m2 = 180 mg, Intravenous,  Once, 23 of 25 cycles Dose modification: 178 mg (original dose 85 mg/m2, Cycle 17, Reason: Other (see comments), Comment: insurance adjusted dose ) Administration: 180 mg (07/24/2019), 180 mg (08/07/2019), 180 mg (08/21/2019), 180 mg (09/04/2019), 180 mg (09/20/2019), 180 mg (10/04/2019), 180 mg (10/16/2019), 180 mg (10/30/2019), 180 mg (11/22/2019), 180 mg (12/06/2019), 180 mg (12/20/2019), 180 mg (01/03/2020), 180 mg (01/29/2020), 180 mg (02/12/2020), 180 mg (02/26/2020), 180  mg (03/11/2020),  180 mg (04/08/2020), 180 mg (04/24/2020), 180 mg (05/08/2020), 180 mg (05/22/2020), 180 mg (06/10/2020), 180 mg (06/25/2020) fluorouracil (ADRUCIL) 5,000 mg in sodium chloride 0.9 % 150 mL chemo infusion, 5,050 mg, Intravenous, 1 Day/Dose, 23 of 25 cycles Administration: 5,000 mg (07/24/2019), 5,000 mg (08/07/2019), 5,000 mg (08/21/2019), 5,050 mg (09/04/2019), 5,000 mg (10/04/2019), 5,000 mg (10/16/2019), 5,000 mg (11/22/2019), 5,000 mg (12/06/2019), 5,000 mg (12/20/2019), 5,000 mg (01/03/2020), 5,000 mg (01/29/2020), 5,000 mg (02/12/2020), 5,000 mg (02/26/2020), 5,000 mg (03/11/2020), 5,000 mg (03/25/2020), 5,000 mg (04/08/2020), 5,000 mg (04/24/2020), 5,000 mg (05/08/2020), 5,000 mg (05/22/2020), 5,000 mg (06/10/2020), 5,000 mg (06/25/2020) bevacizumab-bvzr (ZIRABEV) 400 mg in sodium chloride 0.9 % 100 mL chemo infusion, 5 mg/kg = 400 mg, Intravenous,  Once, 23 of 25 cycles Administration: 400 mg (08/07/2019), 400 mg (08/21/2019), 400 mg (09/04/2019), 400 mg (09/20/2019), 400 mg (10/04/2019), 400 mg (10/16/2019), 400 mg (10/30/2019), 400 mg (11/22/2019), 400 mg (12/06/2019), 400 mg (12/20/2019), 400 mg (01/03/2020), 400 mg (01/29/2020), 400 mg (02/12/2020), 400 mg (02/26/2020), 400 mg (03/11/2020), 400 mg (03/25/2020), 400 mg (04/08/2020), 400 mg (04/24/2020), 400 mg (05/08/2020), 400 mg (05/22/2020), 400 mg (06/10/2020), 400 mg (06/25/2020)  for chemotherapy treatment.    07/08/2020 -  Chemotherapy   The patient had dexamethasone (DECADRON) 4 MG tablet, 8 mg, Oral, Daily, 1 of 1 cycle, Start date: --, End date: -- palonosetron (ALOXI) injection 0.25 mg, 0.25 mg, Intravenous,  Once, 4 of 8 cycles Administration: 0.25 mg (07/08/2020), 0.25 mg (07/22/2020), 0.25 mg (08/05/2020) irinotecan (CAMPTOSAR) 380 mg in sodium chloride 0.9 % 500 mL chemo infusion, 180 mg/m2 = 380 mg, Intravenous,  Once, 4 of 8 cycles Administration: 380 mg (07/08/2020), 380 mg (07/22/2020), 380 mg (08/05/2020) fluorouracil (ADRUCIL) 5,000 mg in sodium chloride 0.9 % 150 mL chemo infusion,  2,369 mg/m2 = 5,050 mg, Intravenous, 1 Day/Dose, 4 of 8 cycles Administration: 5,000 mg (07/08/2020), 5,000 mg (07/22/2020), 5,000 mg (08/05/2020) bevacizumab-bvzr (ZIRABEV) 450 mg in sodium chloride 0.9 % 100 mL chemo infusion, 400 mg, Intravenous,  Once, 4 of 8 cycles Administration: 450 mg (07/08/2020), 450 mg (07/22/2020), 400 mg (08/05/2020)  for chemotherapy treatment.      HISTORY OF PRESENTING ILLNESS:  RHET RORKE 79 y.o.  male with metastatic colon cancer to the liver currently on FOLFIRI plus Avastin chemotherapy.  Patient denies any further episodes of nausea vomiting.  No diarrhea.  No abdominal pain.  Denies any chest pain or shortness of breath or cough.  Review of Systems  Constitutional: Positive for malaise/fatigue. Negative for chills, diaphoresis, fever and weight loss.  HENT: Negative for nosebleeds and sore throat.   Eyes: Negative for double vision.  Respiratory: Negative for cough, hemoptysis, sputum production, shortness of breath and wheezing.   Cardiovascular: Negative for chest pain, palpitations, orthopnea and leg swelling.  Gastrointestinal: Negative for abdominal pain, blood in stool, constipation, diarrhea, heartburn, melena, nausea and vomiting.  Genitourinary: Negative for dysuria, frequency and urgency.  Musculoskeletal: Positive for back pain and joint pain.  Skin: Negative.  Negative for itching and rash.  Neurological: Positive for tingling. Negative for dizziness, focal weakness, weakness and headaches.  Endo/Heme/Allergies: Does not bruise/bleed easily.  Psychiatric/Behavioral: Negative for depression. The patient is not nervous/anxious and does not have insomnia.      MEDICAL HISTORY:  Past Medical History:  Diagnosis Date  . Arthritis   . Cancer ([place])    colon cancer 02/2019 per pt   . Diabetes mellitus without complication ([place])   . H/O colon cancer, stage  IV   . Hyperlipemia   . Hypertension     SURGICAL HISTORY: Past Surgical  History:  Procedure Laterality Date  . IR IMAGING GUIDED PORT INSERTION  07/20/2019  . JOINT REPLACEMENT      SOCIAL HISTORY: Social History   Socioeconomic History  . Marital status: Married    Spouse name: Not on file  . Number of children: Not on file  . Years of education: Not on file  . Highest education level: Not on file  Occupational History  . Not on file  Tobacco Use  . Smoking status: Current Every Day Smoker    Packs/day: 0.25    Types: Cigarettes  . Smokeless tobacco: Never Used  . Tobacco comment: 1 to 2 cigarettes a day occasionally  Vaping Use  . Vaping Use: Never used  Substance and Sexual Activity  . Alcohol use: No  . Drug use: No  . Sexual activity: Not on file  Other Topics Concern  . Not on file  Social History Narrative   Public affairs consultant retd; lives in [place]; smoking 3cig/day; [3/4 ppd x started at 9 years]; no alcohol. Son & daughter; wife dementia [waiting for placement].    Social Determinants of Health   Financial Resource Strain:   . Difficulty of Paying Living Expenses: Not on file  Food Insecurity:   . Worried About Programme researcher, broadcasting/film/video in the Last Year: Not on file  . Ran Out of Food in the Last Year: Not on file  Transportation Needs:   . Lack of Transportation (Medical): Not on file  . Lack of Transportation (Non-Medical): Not on file  Physical Activity:   . Days of Exercise per Week: Not on file  . Minutes of Exercise per Session: Not on file  Stress:   . Feeling of Stress : Not on file  Social Connections:   . Frequency of Communication with Friends and Family: Not on file  . Frequency of Social Gatherings with Friends and Family: Not on file  . Attends Religious Services: Not on file  . Active Member of Clubs or Organizations: Not on file  . Attends Banker Meetings: Not on file  . Marital Status: Not on file  Intimate Partner Violence:   . Fear of Current or Ex-Partner: Not on file  .  Emotionally Abused: Not on file  . Physically Abused: Not on file  . Sexually Abused: Not on file    FAMILY HISTORY: Family History  Problem Relation Age of Onset  . Peptic Ulcer Disease Father     ALLERGIES:  is allergic to ace inhibitors.  MEDICATIONS:  Current Outpatient Medications  Medication Sig Dispense Refill  . amLODipine (NORVASC) 5 MG tablet Take 5 mg by mouth daily.    . fluticasone (FLONASE) 50 MCG/ACT nasal spray Place 1 spray into both nostrils daily as needed.     . lidocaine-prilocaine (EMLA) cream Apply 1 application topically as needed. 30 g 0  . meloxicam (MOBIC) 15 MG tablet Take 1 tablet (15 mg total) by mouth daily. 90 tablet 0  . pravastatin (PRAVACHOL) 40 MG tablet Take 40 mg by mouth daily.     . tamsulosin (FLOMAX) 0.4 MG CAPS capsule Take 1 capsule (0.4 mg total) by mouth daily. 30 capsule 11  . montelukast (SINGULAIR) 10 MG tablet TAKE 1 TABLET BY MOUTH EVERYDAY AT BEDTIME (Patient not taking: Reported on 03/25/2020) 90 tablet 1  . ondansetron (ZOFRAN-ODT) 4 MG disintegrating tablet Take 1 tablet (4 mg  total) by mouth every 6 (six) hours as needed for nausea. (Patient not taking: Reported on 12/05/2019) 20 tablet 0  . prochlorperazine (COMPAZINE) 10 MG tablet Take 1 tablet (10 mg total) by mouth every 6 (six) hours as needed for nausea or vomiting. (Patient not taking: Reported on 12/05/2019) 40 tablet 1   No current facility-administered medications for this visit.   Facility-Administered Medications Ordered in Other Visits  Medication Dose Route Frequency Provider Last Rate Last Admin  . bevacizumab-bvzr (ZIRABEV) 400 mg in sodium chloride 0.9 % 100 mL chemo infusion  5 mg/kg (Treatment Plan Recorded) Intravenous Once Cammie Sickle, MD      . dexamethasone (DECADRON) 10 mg in sodium chloride 0.9 % 50 mL IVPB  10 mg Intravenous Once Charlaine Dalton R, MD      . fluorouracil (ADRUCIL) 5,000 mg in sodium chloride 0.9 % 150 mL chemo infusion  5,000  mg Intravenous 1 day or 1 dose Charlaine Dalton R, MD      . irinotecan (CAMPTOSAR) 380 mg in sodium chloride 0.9 % 500 mL chemo infusion  180 mg/m2 (Treatment Plan Recorded) Intravenous Once Cammie Sickle, MD          .  PHYSICAL EXAMINATION: ECOG PERFORMANCE STATUS: 0 - Asymptomatic  Vitals:   08/19/20 0832  BP: 124/70  Pulse: 65  Resp: 16  Temp: 98.5 F (36.9 C)  SpO2: 100%   Filed Weights   08/19/20 0832  Weight: 195 lb 9.6 oz (88.7 kg)    Physical Exam HENT:     Head: Normocephalic and atraumatic.     Mouth/Throat:     Pharynx: No oropharyngeal exudate.  Eyes:     Pupils: Pupils are equal, round, and reactive to light.  Cardiovascular:     Rate and Rhythm: Normal rate and regular rhythm.  Pulmonary:     Effort: No respiratory distress.     Breath sounds: No wheezing.  Abdominal:     General: Bowel sounds are normal. There is no distension.     Palpations: Abdomen is soft. There is no mass.     Tenderness: There is no abdominal tenderness. There is no guarding or rebound.  Musculoskeletal:        General: No tenderness. Normal range of motion.     Cervical back: Normal range of motion and neck supple.  Skin:    General: Skin is warm.  Neurological:     Mental Status: He is alert and oriented to person, place, and time.  Psychiatric:        Mood and Affect: Affect normal.    LABORATORY DATA:  I have reviewed the data as listed Lab Results  Component Value Date   WBC 3.3 (L) 08/19/2020   HGB 12.3 (L) 08/19/2020   HCT 36.1 (L) 08/19/2020   MCV 101.4 (H) 08/19/2020   PLT 121 (L) 08/19/2020   Recent Labs    06/25/20 0841 06/25/20 0841 07/08/20 0843 07/08/20 0843 07/22/20 0836 08/05/20 0821 08/19/20 0820  NA 138   < > 139   < > 138 139 139  K 3.6   < > 3.6   < > 3.6 3.7 3.7  CL 108   < > 108   < > 107 109 108  CO2 24   < > 25   < > $R'25 25 26  'NV$ GLUCOSE 201*   < > 209*   < > 195* 198* 182*  BUN 10   < > 9   < >  _0 CREATININE 0.80    < > 0.70   < > 0.89 0.86 0.81  CALCIUM 8.0*   < > 8.0*   < > 8.1* 8.0* 8.2*  GFRNONAA >60   < > >60   < > >60 >60 >60  GFRAA >60  --  >60  --  >60  --   --   PROT 6.5   < > 6.5   < > 6.7 6.2* 6.1*  ALBUMIN 3.2*   < > 3.1*   < > 3.1* 3.0* 3.0*  AST 32   < > 31   < > 29 59* 35  ALT 20   < > 19   < > 19 42 25  ALKPHOS 121   < > 102   < > 108 118 108  BILITOT 0.8   < > 0.7   < > 0.8 1.0 0.7   < > = values in this interval not displayed.    RADIOGRAPHIC STUDIES: I have personally reviewed the radiological images as listed and agreed with the findings in the report. No results found.  ASSESSMENT & PLAN:   Cancer of right colon ([place]) #Right-sided colon adenocarcinoma-with synchronous metastasis to liver/unresectable. CT scan SEP 4th 2021- Progressive disease-left lower lobe lung nodule 8 mm [previously 4 mm]; increase in size of the hepatic metastases by few millimeters.;  Soft tissue nodule adjacent to anastomotic site again increased by few millimeters.  currently on FOLFIRI + avastin. CEA- OCT- 30;trending lower Clinically STABLE.   #Proceed with today- of FOLFIRI plus Avastin chemotherapy.  Labs today reviewed;  acceptable for treatment today. Will plan CT scan after 6 cycles; discussed with patient.   # Nausea/vomitting- [> 12 days post last chemo]- resolved. Monitor closely.   # PN- G-1-2; sec to oxaliplatin.  STABLE.   # DISPOSITION:  #FOLFIRI+ Avastin today; pump off in 2 days. # follow up in 2 weeks/ [ MD; labs- cbc/cmp/UA/ CEA; FOLFIRI + avastin;pump off in 2 days -Dr.B  All questions were answered. The patient knows to call the clinic with any problems, questions or concerns.    Cammie Sickle, MD 08/19/2020 9:18 AM

## 2020-08-19 NOTE — Assessment & Plan Note (Addendum)
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 CT scan SEP 4th 2021- Progressive disease-left lower lobe lung nodule 8 mm [previously 4 mm]; increase in size of the hepatic metastases by few millimeters.;  Soft tissue nodule adjacent to anastomotic site again increased by few millimeters.  currently on FOLFIRI + avastin. CEA- OCT- 30;trending lower Clinically STABLE.   #Proceed with today- of FOLFIRI plus Avastin chemotherapy.  Labs today reviewed;  acceptable for treatment today. Will plan CT scan after 6 cycles; discussed with patient.   # Nausea/vomitting- [> 12 days post last chemo]- resolved. Monitor closely.   # PN- G-1-2; sec to oxaliplatin.  STABLE.   # DISPOSITION:  #FOLFIRI+ Avastin today; pump off in 2 days. # follow up in 2 weeks/ [ MD; labs- cbc/cmp/UA/ CEA; FOLFIRI + avastin;pump off in 2 days -Dr.B

## 2020-08-20 LAB — CEA: CEA: 29.8 ng/mL — ABNORMAL HIGH (ref 0.0–4.7)

## 2020-08-21 ENCOUNTER — Inpatient Hospital Stay: Payer: Medicare HMO

## 2020-08-21 ENCOUNTER — Other Ambulatory Visit: Payer: Self-pay

## 2020-08-21 VITALS — BP 148/81 | HR 80 | Temp 97.4°F | Resp 16

## 2020-08-21 DIAGNOSIS — C787 Secondary malignant neoplasm of liver and intrahepatic bile duct: Secondary | ICD-10-CM | POA: Diagnosis not present

## 2020-08-21 DIAGNOSIS — Z5111 Encounter for antineoplastic chemotherapy: Secondary | ICD-10-CM | POA: Diagnosis not present

## 2020-08-21 DIAGNOSIS — C182 Malignant neoplasm of ascending colon: Secondary | ICD-10-CM

## 2020-08-21 DIAGNOSIS — Z7189 Other specified counseling: Secondary | ICD-10-CM

## 2020-08-21 DIAGNOSIS — Z5112 Encounter for antineoplastic immunotherapy: Secondary | ICD-10-CM | POA: Diagnosis not present

## 2020-08-21 MED ORDER — HEPARIN SOD (PORK) LOCK FLUSH 100 UNIT/ML IV SOLN
INTRAVENOUS | Status: AC
  Filled 2020-08-21: qty 5

## 2020-08-21 MED ORDER — SODIUM CHLORIDE 0.9% FLUSH
10.0000 mL | INTRAVENOUS | Status: DC | PRN
  Administered 2020-08-21: 10 mL
  Filled 2020-08-21: qty 10

## 2020-08-21 MED ORDER — HEPARIN SOD (PORK) LOCK FLUSH 100 UNIT/ML IV SOLN
500.0000 [IU] | Freq: Once | INTRAVENOUS | Status: AC | PRN
  Administered 2020-08-21: 500 [IU]
  Filled 2020-08-21: qty 5

## 2020-08-30 ENCOUNTER — Encounter: Payer: Self-pay | Admitting: Internal Medicine

## 2020-09-02 ENCOUNTER — Inpatient Hospital Stay: Payer: Medicare HMO | Admitting: Internal Medicine

## 2020-09-02 ENCOUNTER — Inpatient Hospital Stay: Payer: Medicare HMO

## 2020-09-02 ENCOUNTER — Encounter: Payer: Self-pay | Admitting: Internal Medicine

## 2020-09-02 ENCOUNTER — Other Ambulatory Visit: Payer: Self-pay

## 2020-09-02 ENCOUNTER — Inpatient Hospital Stay (HOSPITAL_BASED_OUTPATIENT_CLINIC_OR_DEPARTMENT_OTHER): Payer: Medicare HMO | Admitting: Hospice and Palliative Medicine

## 2020-09-02 DIAGNOSIS — Z515 Encounter for palliative care: Secondary | ICD-10-CM | POA: Diagnosis not present

## 2020-09-02 DIAGNOSIS — Z5111 Encounter for antineoplastic chemotherapy: Secondary | ICD-10-CM | POA: Diagnosis not present

## 2020-09-02 DIAGNOSIS — Z7189 Other specified counseling: Secondary | ICD-10-CM

## 2020-09-02 DIAGNOSIS — C787 Secondary malignant neoplasm of liver and intrahepatic bile duct: Secondary | ICD-10-CM | POA: Diagnosis not present

## 2020-09-02 DIAGNOSIS — C182 Malignant neoplasm of ascending colon: Secondary | ICD-10-CM

## 2020-09-02 DIAGNOSIS — Z5112 Encounter for antineoplastic immunotherapy: Secondary | ICD-10-CM | POA: Diagnosis not present

## 2020-09-02 LAB — COMPREHENSIVE METABOLIC PANEL
ALT: 21 U/L (ref 0–44)
AST: 31 U/L (ref 15–41)
Albumin: 3.1 g/dL — ABNORMAL LOW (ref 3.5–5.0)
Alkaline Phosphatase: 100 U/L (ref 38–126)
Anion gap: 5 (ref 5–15)
BUN: 18 mg/dL (ref 8–23)
CO2: 26 mmol/L (ref 22–32)
Calcium: 8.4 mg/dL — ABNORMAL LOW (ref 8.9–10.3)
Chloride: 108 mmol/L (ref 98–111)
Creatinine, Ser: 1.01 mg/dL (ref 0.61–1.24)
GFR, Estimated: >60 mL/min (ref >60)
Glucose, Bld: 196 mg/dL — ABNORMAL HIGH (ref 70–99)
Potassium: 3.5 mmol/L (ref 3.5–5.1)
Sodium: 139 mmol/L (ref 135–145)
Total Bilirubin: 0.8 mg/dL (ref 0.3–1.2)
Total Protein: 6.5 g/dL (ref 6.5–8.1)

## 2020-09-02 LAB — CBC WITH DIFFERENTIAL/PLATELET
Abs Immature Granulocytes: 0.01 10*3/uL (ref 0.00–0.07)
Basophils Absolute: 0 10*3/uL (ref 0.0–0.1)
Basophils Relative: 1 %
Eosinophils Absolute: 0.3 10*3/uL (ref 0.0–0.5)
Eosinophils Relative: 8 %
HCT: 37.3 % — ABNORMAL LOW (ref 39.0–52.0)
Hemoglobin: 12.6 g/dL — ABNORMAL LOW (ref 13.0–17.0)
Immature Granulocytes: 0 %
Lymphocytes Relative: 35 %
Lymphs Abs: 1.1 10*3/uL (ref 0.7–4.0)
MCH: 34.3 pg — ABNORMAL HIGH (ref 26.0–34.0)
MCHC: 33.8 g/dL (ref 30.0–36.0)
MCV: 101.6 fL — ABNORMAL HIGH (ref 80.0–100.0)
Monocytes Absolute: 0.4 10*3/uL (ref 0.1–1.0)
Monocytes Relative: 12 %
Neutro Abs: 1.3 10*3/uL — ABNORMAL LOW (ref 1.7–7.7)
Neutrophils Relative %: 44 %
Platelets: 125 10*3/uL — ABNORMAL LOW (ref 150–400)
RBC: 3.67 MIL/uL — ABNORMAL LOW (ref 4.22–5.81)
RDW: 14.6 % (ref 11.5–15.5)
WBC: 3 10*3/uL — ABNORMAL LOW (ref 4.0–10.5)
nRBC: 0 % (ref 0.0–0.2)

## 2020-09-02 LAB — URINALYSIS, COMPLETE (UACMP) WITH MICROSCOPIC
Bacteria, UA: NONE SEEN
Bilirubin Urine: NEGATIVE
Glucose, UA: 50 mg/dL — AB
Hgb urine dipstick: NEGATIVE
Ketones, ur: NEGATIVE mg/dL
Leukocytes,Ua: NEGATIVE
Nitrite: NEGATIVE
Protein, ur: NEGATIVE mg/dL
Specific Gravity, Urine: 1.016 (ref 1.005–1.030)
pH: 7 (ref 5.0–8.0)

## 2020-09-02 MED ORDER — PALONOSETRON HCL INJECTION 0.25 MG/5ML
0.2500 mg | Freq: Once | INTRAVENOUS | Status: AC
  Administered 2020-09-02: 0.25 mg via INTRAVENOUS
  Filled 2020-09-02: qty 5

## 2020-09-02 MED ORDER — SODIUM CHLORIDE 0.9 % IV SOLN
Freq: Once | INTRAVENOUS | Status: AC
  Filled 2020-09-02: qty 250

## 2020-09-02 MED ORDER — SODIUM CHLORIDE 0.9 % IV SOLN
180.0000 mg/m2 | Freq: Once | INTRAVENOUS | Status: AC
  Administered 2020-09-02: 380 mg via INTRAVENOUS
  Filled 2020-09-02: qty 15

## 2020-09-02 MED ORDER — SODIUM CHLORIDE 0.9 % IV SOLN
10.0000 mg | Freq: Once | INTRAVENOUS | Status: AC
  Administered 2020-09-02: 10 mg via INTRAVENOUS
  Filled 2020-09-02: qty 10

## 2020-09-02 MED ORDER — SODIUM CHLORIDE 0.9% FLUSH
10.0000 mL | INTRAVENOUS | Status: DC | PRN
  Administered 2020-09-02: 10 mL via INTRAVENOUS
  Filled 2020-09-02: qty 10

## 2020-09-02 MED ORDER — DIPHENOXYLATE-ATROPINE 2.5-0.025 MG PO TABS
1.0000 | ORAL_TABLET | Freq: Once | ORAL | Status: AC
  Administered 2020-09-02: 1 via ORAL
  Filled 2020-09-02: qty 1

## 2020-09-02 MED ORDER — SODIUM CHLORIDE 0.9 % IV SOLN
5.0000 mg/kg | Freq: Once | INTRAVENOUS | Status: AC
  Administered 2020-09-02: 400 mg via INTRAVENOUS
  Filled 2020-09-02: qty 16

## 2020-09-02 MED ORDER — SODIUM CHLORIDE 0.9 % IV SOLN
5000.0000 mg | INTRAVENOUS | Status: DC
  Administered 2020-09-02: 5000 mg via INTRAVENOUS
  Filled 2020-09-02: qty 100

## 2020-09-02 NOTE — Progress Notes (Signed)
[place]  [phone] [phone]   Name: Oscar [name] Date: 09/02/2020 MRN: [id number]  DOB: 03-Feb-1941  Patient Care Team: Sofie Hartigan, MD as PCP - General (Family Medicine) [name], MD as Consulting Physician (Hematology and Oncology)    REASON FOR CONSULTATION: Palliative Care consult requested for this 79 y.o. male with multiple medical problems including stage IV colon cancer on FOLFIRI plus Avastin chemotherapy.  Patient was referred to palliative care to help address goals and manage ongoing symptoms.  SOCIAL HISTORY:     reports that he has been smoking cigarettes. He has been smoking about 0.25 packs per day. He has never used smokeless tobacco. He reports that he does not drink alcohol and does not use drugs.   Patient is married.  He and his wife live with his stepdaughter.  His wife has advanced dementia and is followed at home by hospice.  Patient has a son and daughter and two twin stepdaughters.  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  ADVANCE DIRECTIVES:  Does not have  CODE STATUS: DNR/DNI (MOST form completed on 11/02/2019)  PAST MEDICAL HISTORY: Past Medical History:  Diagnosis Date   Arthritis    Cancer ([place])    colon cancer 02/2019 per pt    Diabetes mellitus without complication ([place])    H/O colon cancer, stage IV    Hyperlipemia    Hypertension     PAST SURGICAL HISTORY:  Past Surgical History:  Procedure Laterality Date   IR IMAGING GUIDED PORT INSERTION  07/20/2019   JOINT REPLACEMENT      HEMATOLOGY/ONCOLOGY HISTORY:  Oncology History Overview Note  # MAY 2020- 3. 03/09/19 Liver biopsy. Microscopic examination shows malignant cells with glandular architecture consistent with adenocarcinoma. The malignant cells are positive for CK20 and CDX-2. These findings support the clinical impression of metastatic colon adenocarcinoma. 4. 03/10/19 R  hemicolectomy. Tumor site cecum. Adenocarcinoma. Mucinous features present. G2. No tumor deposits. Invades visceral peritoneum. No tumor perforation. LVI present. PNI not identified. All margins uninvolved. 1/12 LNs. [id number]. Periappendiceal inflammation c/w resolving abscess. Microsatellite stable (MSS). [Dr.Mettu; [facility]]  # SEP 4th 2020 [compared to May 2020]  Interval increase in size of the metastases to the hepatic dome, The metastasis to the left hepatic lobe is unchanged; 2.  New subcentimeter hypoattenuating lesion in the inferior right hepatic lobe, incompletely characterized on CT. 3.  Postsurgical changes following right hemicolectomy.  # OCT 2020- FOLFOX +avastin; CT dec 22nd 2020- [compared to [facility] sep 9th 2020]-Liver- slight progression versus stable disease; CT scan SEP 4th 2021- Progressive disease-left lower lobe lung nodule 8 mm [previously 4 mm]; increase in size of the hepatic metastases by few millimeters.;  Soft tissue nodule adjacent to anastomotic site again increased by few millimeters.STOP FOLFOX; cont avastin  #  SEP 20th, 2021- FOLFIRI+ AVASTIN  # NGS/F-ONE-MUTATED K-RAS [G]  # PALLIATIVE CARE EVALUATION: 09/20/2019-[name]  # PAIN MANAGEMENT: NA   DIAGNOSIS: COLON CANCER  STAGE:  IV     ;  GOALS:Palliative  CURRENT/MOST RECENT THERAPY : FOLFIRI+ avastin [C]    Cancer of right colon ([place])  07/05/2019 Initial Diagnosis   Cancer of right colon ([place])   07/24/2019 - 06/25/2020 Chemotherapy   The patient had dexamethasone (DECADRON) 4 MG tablet, 8 mg, Oral, Daily, 1 of 1 cycle, Start date: --, End date: -- palonosetron (ALOXI) injection 0.25 mg, 0.25 mg, Intravenous,  Once, 23 of 25 cycles Administration: 0.25 mg (  07/24/2019), 0.25 mg (08/07/2019), 0.25 mg (08/21/2019), 0.25 mg (09/04/2019), 0.25 mg (09/20/2019), 0.25 mg (10/04/2019), 0.25 mg (10/16/2019), 0.25 mg (10/30/2019), 0.25 mg (11/22/2019), 0.25 mg (12/06/2019), 0.25 mg (12/20/2019), 0.25 mg (01/03/2020), 0.25 mg  (01/29/2020), 0.25 mg (02/12/2020), 0.25 mg (02/26/2020), 0.25 mg (03/11/2020), 0.25 mg (03/25/2020), 0.25 mg (04/08/2020), 0.25 mg (04/24/2020), 0.25 mg (05/08/2020), 0.25 mg (05/22/2020), 0.25 mg (06/10/2020), 0.25 mg (06/25/2020) leucovorin 800 mg in dextrose 5 % 250 mL infusion, 844 mg, Intravenous,  Once, 23 of 25 cycles Administration: 800 mg (07/24/2019), 800 mg (08/07/2019), 800 mg (08/21/2019), 800 mg (09/04/2019), 800 mg (09/20/2019), 800 mg (10/04/2019), 800 mg (10/16/2019), 800 mg (10/30/2019), 800 mg (11/22/2019), 800 mg (12/06/2019), 800 mg (12/20/2019), 800 mg (01/03/2020), 800 mg (01/29/2020), 800 mg (02/12/2020), 800 mg (02/26/2020), 800 mg (03/11/2020), 800 mg (03/25/2020), 800 mg (04/08/2020), 800 mg (04/24/2020), 800 mg (05/08/2020), 800 mg (05/22/2020), 800 mg (06/10/2020), 800 mg (06/25/2020) oxaliplatin (ELOXATIN) 180 mg in dextrose 5 % 500 mL chemo infusion, 85 mg/m2 = 180 mg, Intravenous,  Once, 23 of 25 cycles Dose modification: 178 mg (original dose 85 mg/m2, Cycle 17, Reason: Other (see comments), Comment: insurance adjusted dose ) Administration: 180 mg (07/24/2019), 180 mg (08/07/2019), 180 mg (08/21/2019), 180 mg (09/04/2019), 180 mg (09/20/2019), 180 mg (10/04/2019), 180 mg (10/16/2019), 180 mg (10/30/2019), 180 mg (11/22/2019), 180 mg (12/06/2019), 180 mg (12/20/2019), 180 mg (01/03/2020), 180 mg (01/29/2020), 180 mg (02/12/2020), 180 mg (02/26/2020), 180 mg (03/11/2020), 180 mg (04/08/2020), 180 mg (04/24/2020), 180 mg (05/08/2020), 180 mg (05/22/2020), 180 mg (06/10/2020), 180 mg (06/25/2020) fluorouracil (ADRUCIL) 5,000 mg in sodium chloride 0.9 % 150 mL chemo infusion, 5,050 mg, Intravenous, 1 Day/Dose, 23 of 25 cycles Administration: 5,000 mg (07/24/2019), 5,000 mg (08/07/2019), 5,000 mg (08/21/2019), 5,050 mg (09/04/2019), 5,000 mg (10/04/2019), 5,000 mg (10/16/2019), 5,000 mg (11/22/2019), 5,000 mg (12/06/2019), 5,000 mg (12/20/2019), 5,000 mg (01/03/2020), 5,000 mg (01/29/2020), 5,000 mg (02/12/2020), 5,000 mg (02/26/2020), 5,000 mg  (03/11/2020), 5,000 mg (03/25/2020), 5,000 mg (04/08/2020), 5,000 mg (04/24/2020), 5,000 mg (05/08/2020), 5,000 mg (05/22/2020), 5,000 mg (06/10/2020), 5,000 mg (06/25/2020) bevacizumab-bvzr (ZIRABEV) 400 mg in sodium chloride 0.9 % 100 mL chemo infusion, 5 mg/kg = 400 mg, Intravenous,  Once, 23 of 25 cycles Administration: 400 mg (08/07/2019), 400 mg (08/21/2019), 400 mg (09/04/2019), 400 mg (09/20/2019), 400 mg (10/04/2019), 400 mg (10/16/2019), 400 mg (10/30/2019), 400 mg (11/22/2019), 400 mg (12/06/2019), 400 mg (12/20/2019), 400 mg (01/03/2020), 400 mg (01/29/2020), 400 mg (02/12/2020), 400 mg (02/26/2020), 400 mg (03/11/2020), 400 mg (03/25/2020), 400 mg (04/08/2020), 400 mg (04/24/2020), 400 mg (05/08/2020), 400 mg (05/22/2020), 400 mg (06/10/2020), 400 mg (06/25/2020)  for chemotherapy treatment.    07/08/2020 -  Chemotherapy   The patient had dexamethasone (DECADRON) 4 MG tablet, 8 mg, Oral, Daily, 1 of 1 cycle, Start date: --, End date: -- palonosetron (ALOXI) injection 0.25 mg, 0.25 mg, Intravenous,  Once, 4 of 8 cycles Administration: 0.25 mg (07/08/2020), 0.25 mg (07/22/2020), 0.25 mg (08/05/2020), 0.25 mg (08/19/2020) irinotecan (CAMPTOSAR) 380 mg in sodium chloride 0.9 % 500 mL chemo infusion, 180 mg/m2 = 380 mg, Intravenous,  Once, 4 of 8 cycles Administration: 380 mg (07/08/2020), 380 mg (07/22/2020), 380 mg (08/05/2020), 380 mg (08/19/2020) fluorouracil (ADRUCIL) 5,000 mg in sodium chloride 0.9 % 150 mL chemo infusion, 2,369 mg/m2 = 5,050 mg, Intravenous, 1 Day/Dose, 4 of 8 cycles Administration: 5,000 mg (07/08/2020), 5,000 mg (07/22/2020), 5,000 mg (08/05/2020), 5,000 mg (08/19/2020) bevacizumab-bvzr (ZIRABEV) 450 mg in sodium chloride 0.9 % 100 mL chemo infusion, 400 mg, Intravenous,  Once, 4 of 8 cycles Administration: 450 mg (07/08/2020), 450 mg (07/22/2020), 400 mg (08/05/2020), 400 mg (08/19/2020)  for chemotherapy treatment.      ALLERGIES:  is allergic to ace inhibitors.  MEDICATIONS:  Current Outpatient Medications   Medication Sig Dispense Refill   amLODipine (NORVASC) 5 MG tablet Take 5 mg by mouth daily.     fluticasone (FLONASE) 50 MCG/ACT nasal spray Place 1 spray into both nostrils daily as needed.      lidocaine-prilocaine (EMLA) cream Apply 1 application topically as needed. 30 g 0   meloxicam (MOBIC) 15 MG tablet Take 1 tablet (15 mg total) by mouth daily. 90 tablet 0   montelukast (SINGULAIR) 10 MG tablet TAKE 1 TABLET BY MOUTH EVERYDAY AT BEDTIME (Patient not taking: Reported on 03/25/2020) 90 tablet 1   ondansetron (ZOFRAN-ODT) 4 MG disintegrating tablet Take 1 tablet (4 mg total) by mouth every 6 (six) hours as needed for nausea. (Patient not taking: Reported on 12/05/2019) 20 tablet 0   pravastatin (PRAVACHOL) 40 MG tablet Take 40 mg by mouth daily.      prochlorperazine (COMPAZINE) 10 MG tablet Take 1 tablet (10 mg total) by mouth every 6 (six) hours as needed for nausea or vomiting. (Patient not taking: Reported on 12/05/2019) 40 tablet 1   tamsulosin (FLOMAX) 0.4 MG CAPS capsule Take 1 capsule (0.4 mg total) by mouth daily. 30 capsule 11   No current facility-administered medications for this visit.   Facility-Administered Medications Ordered in Other Visits  Medication Dose Route Frequency Provider Last Rate Last Admin   sodium chloride flush (NS) 0.9 % injection 10 mL  10 mL Intravenous PRN Cammie Sickle, MD   10 mL at 09/02/20 0840    VITAL SIGNS: There were no vitals taken for this visit. There were no vitals filed for this visit.  Estimated body mass index is 26.18 kg/m as calculated from the following:   Height as of an earlier encounter on 09/02/20: 6' (1.829 m).   Weight as of an earlier encounter on 09/02/20: 193 lb (87.5 kg).  LABS: CBC:    Component Value Date/Time   WBC 3.0 (L) 09/02/2020 0834   HGB 12.6 (L) 09/02/2020 0834   HGB 13.3 09/18/2013 0424   HCT 37.3 (L) 09/02/2020 0834   HCT 38.1 (L) 09/18/2013 0424   PLT 125 (L) 09/02/2020 0834   PLT  307 09/18/2013 0424   MCV 101.6 (H) 09/02/2020 0834   MCV 94 09/18/2013 0424   NEUTROABS 1.3 (L) 09/02/2020 0834   NEUTROABS 17.0 (H) 09/18/2013 0424   LYMPHSABS 1.1 09/02/2020 0834   LYMPHSABS 0.9 (L) 09/18/2013 0424   MONOABS 0.4 09/02/2020 0834   MONOABS 0.4 09/18/2013 0424   EOSABS 0.3 09/02/2020 0834   EOSABS 0.0 09/18/2013 0424   BASOSABS 0.0 09/02/2020 0834   BASOSABS 0.0 09/18/2013 0424   Comprehensive Metabolic Panel:    Component Value Date/Time   NA 139 08/19/2020 0820   NA 137 09/18/2013 0424   K 3.7 08/19/2020 0820   K 4.5 09/18/2013 0424   CL 108 08/19/2020 0820   CL 107 09/18/2013 0424   CO2 26 08/19/2020 0820   CO2 28 09/18/2013 0424   BUN 10 08/19/2020 0820   BUN 19 (H) 09/18/2013 0424   CREATININE 0.81 08/19/2020 0820   CREATININE 1.15 09/18/2013 0424   GLUCOSE 182 (H) 08/19/2020 0820   GLUCOSE 196 (H) 09/18/2013 0424   CALCIUM 8.2 (L) 08/19/2020 0820   CALCIUM 9.1 09/18/2013 0424  AST 35 08/19/2020 0820   ALT 25 08/19/2020 0820   ALKPHOS 108 08/19/2020 0820   BILITOT 0.7 08/19/2020 0820   PROT 6.1 (L) 08/19/2020 0820   ALBUMIN 3.0 (L) 08/19/2020 0820    RADIOGRAPHIC STUDIES: No results found.  PERFORMANCE STATUS (ECOG) : 1 - Symptomatic but completely ambulatory  Review of Systems Unless otherwise noted, a complete review of systems is negative.  Physical Exam General: NAD, frail appearing, thin Pulmonary: Unlabored Extremities: no edema, no joint deformities Skin: no rashes Neurological: Weakness but otherwise nonfocal  IMPRESSION: Routine follow-up visit today.  Patient was in the clinic.  Patient reports doing well.  He denies any significant changes or concerns today.  No symptomatic complaints at present.  He still golfing and playing poker with friends at least once a week.  Patient reports good oral intake.  His weight is stable.  His CEA is down trended recently.   PLAN: -Continue current scope of treatment -Follow-up  virtual visit 2 to 3 months   Patient expressed understanding and was in agreement with this plan. He also understands that He can call the clinic at any time with any questions, concerns, or complaints.     Time Total: 15 minutes  Visit consisted of counseling and education dealing with the complex and emotionally intense issues of symptom management and palliative care in the setting of serious and potentially life-threatening illness.Greater than 50%  of this time was spent counseling and coordinating care related to the above assessment and plan.  Signed by: Altha Harm, PhD, NP-C

## 2020-09-02 NOTE — Progress Notes (Signed)
[place] NOTE  Patient Care Team: Sofie Hartigan, MD as PCP - General (Family Medicine) [name], MD as Consulting Physician (Hematology and Oncology)  CHIEF COMPLAINTS/PURPOSE OF CONSULTATION: Colon cancer  #  Oncology History Overview Note  # MAY 2020- 3. 03/09/19 Liver biopsy. Microscopic examination shows malignant cells with glandular architecture consistent with adenocarcinoma. The malignant cells are positive for CK20 and CDX-2. These findings support the clinical impression of metastatic colon adenocarcinoma. 4. 03/10/19 R hemicolectomy. Tumor site cecum. Adenocarcinoma. Mucinous features present. G2. No tumor deposits. Invades visceral peritoneum. No tumor perforation. LVI present. PNI not identified. All margins uninvolved. 1/12 LNs. PT4apN1. Periappendiceal inflammation c/w resolving abscess. Microsatellite stable (MSS). [Dr.Mettu; [facility]]  # SEP 4th 2020 [compared to May 2020]  Interval increase in size of the metastases to the hepatic dome, The metastasis to the left hepatic lobe is unchanged; 2.  New subcentimeter hypoattenuating lesion in the inferior right hepatic lobe, incompletely characterized on CT. 3.  Postsurgical changes following right hemicolectomy.  # OCT 2020- FOLFOX +avastin; CT dec 22nd 2020- [compared to [facility] sep 9th 2020]-Liver- slight progression versus stable disease; CT scan SEP 4th 2021- Progressive disease-left lower lobe lung nodule 8 mm [previously 4 mm]; increase in size of the hepatic metastases by few millimeters.;  Soft tissue nodule adjacent to anastomotic site again increased by few millimeters.STOP FOLFOX; cont avastin  #  SEP 20th, 2021- FOLFIRI+ AVASTIN  # NGS/F-ONE-MUTATED K-RAS [G]  # PALLIATIVE CARE EVALUATION: 09/20/2019-Oscar [name]  # PAIN MANAGEMENT: NA   DIAGNOSIS: COLON CANCER  STAGE:  IV     ;  GOALS:Palliative  CURRENT/MOST RECENT THERAPY : FOLFIRI+ avastin [C]    Cancer of right colon ([place])  07/05/2019  Initial Diagnosis   Cancer of right colon ([place])   07/24/2019 - 06/25/2020 Chemotherapy   The patient had dexamethasone (DECADRON) 4 MG tablet, 8 mg, Oral, Daily, 1 of 1 cycle, Start date: --, End date: -- palonosetron (ALOXI) injection 0.25 mg, 0.25 mg, Intravenous,  Once, 23 of 25 cycles Administration: 0.25 mg (07/24/2019), 0.25 mg (08/07/2019), 0.25 mg (08/21/2019), 0.25 mg (09/04/2019), 0.25 mg (09/20/2019), 0.25 mg (10/04/2019), 0.25 mg (10/16/2019), 0.25 mg (10/30/2019), 0.25 mg (11/22/2019), 0.25 mg (12/06/2019), 0.25 mg (12/20/2019), 0.25 mg (01/03/2020), 0.25 mg (01/29/2020), 0.25 mg (02/12/2020), 0.25 mg (02/26/2020), 0.25 mg (03/11/2020), 0.25 mg (03/25/2020), 0.25 mg (04/08/2020), 0.25 mg (04/24/2020), 0.25 mg (05/08/2020), 0.25 mg (05/22/2020), 0.25 mg (06/10/2020), 0.25 mg (06/25/2020) leucovorin 800 mg in dextrose 5 % 250 mL infusion, 844 mg, Intravenous,  Once, 23 of 25 cycles Administration: 800 mg (07/24/2019), 800 mg (08/07/2019), 800 mg (08/21/2019), 800 mg (09/04/2019), 800 mg (09/20/2019), 800 mg (10/04/2019), 800 mg (10/16/2019), 800 mg (10/30/2019), 800 mg (11/22/2019), 800 mg (12/06/2019), 800 mg (12/20/2019), 800 mg (01/03/2020), 800 mg (01/29/2020), 800 mg (02/12/2020), 800 mg (02/26/2020), 800 mg (03/11/2020), 800 mg (03/25/2020), 800 mg (04/08/2020), 800 mg (04/24/2020), 800 mg (05/08/2020), 800 mg (05/22/2020), 800 mg (06/10/2020), 800 mg (06/25/2020) oxaliplatin (ELOXATIN) 180 mg in dextrose 5 % 500 mL chemo infusion, 85 mg/m2 = 180 mg, Intravenous,  Once, 23 of 25 cycles Dose modification: 178 mg (original dose 85 mg/m2, Cycle 17, Reason: Other (see comments), Comment: insurance adjusted dose ) Administration: 180 mg (07/24/2019), 180 mg (08/07/2019), 180 mg (08/21/2019), 180 mg (09/04/2019), 180 mg (09/20/2019), 180 mg (10/04/2019), 180 mg (10/16/2019), 180 mg (10/30/2019), 180 mg (11/22/2019), 180 mg (12/06/2019), 180 mg (12/20/2019), 180 mg (01/03/2020), 180 mg (01/29/2020), 180 mg (02/12/2020), 180 mg (02/26/2020), 180  mg (03/11/2020),  180 mg (04/08/2020), 180 mg (04/24/2020), 180 mg (05/08/2020), 180 mg (05/22/2020), 180 mg (06/10/2020), 180 mg (06/25/2020) fluorouracil (ADRUCIL) 5,000 mg in sodium chloride 0.9 % 150 mL chemo infusion, 5,050 mg, Intravenous, 1 Day/Dose, 23 of 25 cycles Administration: 5,000 mg (07/24/2019), 5,000 mg (08/07/2019), 5,000 mg (08/21/2019), 5,050 mg (09/04/2019), 5,000 mg (10/04/2019), 5,000 mg (10/16/2019), 5,000 mg (11/22/2019), 5,000 mg (12/06/2019), 5,000 mg (12/20/2019), 5,000 mg (01/03/2020), 5,000 mg (01/29/2020), 5,000 mg (02/12/2020), 5,000 mg (02/26/2020), 5,000 mg (03/11/2020), 5,000 mg (03/25/2020), 5,000 mg (04/08/2020), 5,000 mg (04/24/2020), 5,000 mg (05/08/2020), 5,000 mg (05/22/2020), 5,000 mg (06/10/2020), 5,000 mg (06/25/2020) bevacizumab-bvzr (ZIRABEV) 400 mg in sodium chloride 0.9 % 100 mL chemo infusion, 5 mg/kg = 400 mg, Intravenous,  Once, 23 of 25 cycles Administration: 400 mg (08/07/2019), 400 mg (08/21/2019), 400 mg (09/04/2019), 400 mg (09/20/2019), 400 mg (10/04/2019), 400 mg (10/16/2019), 400 mg (10/30/2019), 400 mg (11/22/2019), 400 mg (12/06/2019), 400 mg (12/20/2019), 400 mg (01/03/2020), 400 mg (01/29/2020), 400 mg (02/12/2020), 400 mg (02/26/2020), 400 mg (03/11/2020), 400 mg (03/25/2020), 400 mg (04/08/2020), 400 mg (04/24/2020), 400 mg (05/08/2020), 400 mg (05/22/2020), 400 mg (06/10/2020), 400 mg (06/25/2020)  for chemotherapy treatment.    07/08/2020 -  Chemotherapy   The patient had dexamethasone (DECADRON) 4 MG tablet, 8 mg, Oral, Daily, 1 of 1 cycle, Start date: --, End date: -- palonosetron (ALOXI) injection 0.25 mg, 0.25 mg, Intravenous,  Once, 5 of 8 cycles Administration: 0.25 mg (07/08/2020), 0.25 mg (07/22/2020), 0.25 mg (08/05/2020), 0.25 mg (08/19/2020), 0.25 mg (09/02/2020) irinotecan (CAMPTOSAR) 380 mg in sodium chloride 0.9 % 500 mL chemo infusion, 180 mg/m2 = 380 mg, Intravenous,  Once, 5 of 8 cycles Administration: 380 mg (07/08/2020), 380 mg (07/22/2020), 380 mg (08/05/2020), 380 mg (08/19/2020), 380 mg  (09/02/2020) fluorouracil (ADRUCIL) 5,000 mg in sodium chloride 0.9 % 150 mL chemo infusion, 2,369 mg/m2 = 5,050 mg, Intravenous, 1 Day/Dose, 5 of 8 cycles Administration: 5,000 mg (07/08/2020), 5,000 mg (07/22/2020), 5,000 mg (08/05/2020), 5,000 mg (08/19/2020), 5,000 mg (09/02/2020) bevacizumab-bvzr (ZIRABEV) 450 mg in sodium chloride 0.9 % 100 mL chemo infusion, 400 mg, Intravenous,  Once, 5 of 8 cycles Administration: 450 mg (07/08/2020), 450 mg (07/22/2020), 400 mg (08/05/2020), 400 mg (08/19/2020), 400 mg (09/02/2020)  for chemotherapy treatment.      HISTORY OF PRESENTING ILLNESS:  Oscar [name] 79 y.o.  male with metastatic colon cancer to the liver currently on FOLFIRI plus Avastin chemotherapy.  Patient denies any diarrhea.  Denies any nausea vomiting.  No new shortness of breath or cough.  Chronic back pain chronic shoulder pain.  Mild tingling and numbness in the extremities.  Review of Systems  Constitutional: Positive for malaise/fatigue. Negative for chills, diaphoresis, fever and weight loss.  HENT: Negative for nosebleeds and sore throat.   Eyes: Negative for double vision.  Respiratory: Negative for cough, hemoptysis, sputum production, shortness of breath and wheezing.   Cardiovascular: Negative for chest pain, palpitations, orthopnea and leg swelling.  Gastrointestinal: Negative for abdominal pain, blood in stool, constipation, diarrhea, heartburn, melena, nausea and vomiting.  Genitourinary: Negative for dysuria, frequency and urgency.  Musculoskeletal: Positive for back pain and joint pain.  Skin: Negative.  Negative for itching and rash.  Neurological: Positive for tingling. Negative for dizziness, focal weakness, weakness and headaches.  Endo/Heme/Allergies: Does not bruise/bleed easily.  Psychiatric/Behavioral: Negative for depression. The patient is not nervous/anxious and does not have insomnia.      MEDICAL HISTORY:  Past Medical History:  Diagnosis Date  .  Arthritis   . Cancer ([place])    colon cancer 02/2019 per pt   . Diabetes mellitus without complication ([place])   . H/O colon cancer, stage IV   . Hyperlipemia   . Hypertension     SURGICAL HISTORY: Past Surgical History:  Procedure Laterality Date  . IR IMAGING GUIDED PORT INSERTION  07/20/2019  . JOINT REPLACEMENT      SOCIAL HISTORY: Social History   Socioeconomic History  . Marital status: Married    Spouse name: Not on file  . Number of children: Not on file  . Years of education: Not on file  . Highest education level: Not on file  Occupational History  . Not on file  Tobacco Use  . Smoking status: Current Every Day Smoker    Packs/day: 0.25    Types: Cigarettes  . Smokeless tobacco: Never Used  . Tobacco comment: 1 to 2 cigarettes a day occasionally  Vaping Use  . Vaping Use: Never used  Substance and Sexual Activity  . Alcohol use: No  . Drug use: No  . Sexual activity: Not on file  Other Topics Concern  . Not on file  Social History Narrative   Recruitment consultant retd; lives in [place]; smoking 3cig/day; [3/4 ppd x started at 9 years]; no alcohol. Son & daughter; wife dementia [waiting for placement].    Social Determinants of Health   Financial Resource Strain:   . Difficulty of Paying Living Expenses: Not on file  Food Insecurity:   . Worried About Charity fundraiser in the Last Year: Not on file  . Ran Out of Food in the Last Year: Not on file  Transportation Needs:   . Lack of Transportation (Medical): Not on file  . Lack of Transportation (Non-Medical): Not on file  Physical Activity:   . Days of Exercise per Week: Not on file  . Minutes of Exercise per Session: Not on file  Stress:   . Feeling of Stress : Not on file  Social Connections:   . Frequency of Communication with Friends and Family: Not on file  . Frequency of Social Gatherings with Friends and Family: Not on file  . Attends Religious Services: Not on file  . Active Member  of Clubs or Organizations: Not on file  . Attends Archivist Meetings: Not on file  . Marital Status: Not on file  Intimate Partner Violence:   . Fear of Current or Ex-Partner: Not on file  . Emotionally Abused: Not on file  . Physically Abused: Not on file  . Sexually Abused: Not on file    FAMILY HISTORY: Family History  Problem Relation Age of Onset  . Peptic Ulcer Disease Father     ALLERGIES:  is allergic to ace inhibitors.  MEDICATIONS:  Current Outpatient Medications  Medication Sig Dispense Refill  . amLODipine (NORVASC) 5 MG tablet Take 5 mg by mouth daily.    . fluticasone (FLONASE) 50 MCG/ACT nasal spray Place 1 spray into both nostrils daily as needed.     . lidocaine-prilocaine (EMLA) cream Apply 1 application topically as needed. 30 g 0  . meloxicam (MOBIC) 15 MG tablet Take 1 tablet (15 mg total) by mouth daily. 90 tablet 0  . pravastatin (PRAVACHOL) 40 MG tablet Take 40 mg by mouth daily.     . tamsulosin (FLOMAX) 0.4 MG CAPS capsule Take 1 capsule (0.4 mg total) by mouth daily. 30 capsule 11  . montelukast (SINGULAIR) 10 MG tablet TAKE 1  TABLET BY MOUTH EVERYDAY AT BEDTIME (Patient not taking: Reported on 03/25/2020) 90 tablet 1  . ondansetron (ZOFRAN-ODT) 4 MG disintegrating tablet Take 1 tablet (4 mg total) by mouth every 6 (six) hours as needed for nausea. (Patient not taking: Reported on 12/05/2019) 20 tablet 0  . prochlorperazine (COMPAZINE) 10 MG tablet Take 1 tablet (10 mg total) by mouth every 6 (six) hours as needed for nausea or vomiting. (Patient not taking: Reported on 12/05/2019) 40 tablet 1   No current facility-administered medications for this visit.   Facility-Administered Medications Ordered in Other Visits  Medication Dose Route Frequency Provider Last Rate Last Admin  . fluorouracil (ADRUCIL) 5,000 mg in sodium chloride 0.9 % 150 mL chemo infusion  5,000 mg Intravenous 1 day or 1 dose Charlaine Dalton R, MD   5,000 mg at 09/02/20 1242   . sodium chloride flush (NS) 0.9 % injection 10 mL  10 mL Intravenous PRN Cammie Sickle, MD   10 mL at 09/02/20 0840      .  PHYSICAL EXAMINATION: ECOG PERFORMANCE STATUS: 0 - Asymptomatic  Vitals:   09/02/20 0845  BP: 128/68  Pulse: 68  Resp: 16  Temp: (!) 96.9 F (36.1 C)  SpO2: 100%   Filed Weights   09/02/20 0845  Weight: 193 lb (87.5 kg)    Physical Exam HENT:     Head: Normocephalic and atraumatic.     Mouth/Throat:     Pharynx: No oropharyngeal exudate.  Eyes:     Pupils: Pupils are equal, round, and reactive to light.  Cardiovascular:     Rate and Rhythm: Normal rate and regular rhythm.  Pulmonary:     Effort: No respiratory distress.     Breath sounds: No wheezing.  Abdominal:     General: Bowel sounds are normal. There is no distension.     Palpations: Abdomen is soft. There is no mass.     Tenderness: There is no abdominal tenderness. There is no guarding or rebound.  Musculoskeletal:        General: No tenderness. Normal range of motion.     Cervical back: Normal range of motion and neck supple.  Skin:    General: Skin is warm.  Neurological:     Mental Status: He is alert and oriented to person, place, and time.  Psychiatric:        Mood and Affect: Affect normal.    LABORATORY DATA:  I have reviewed the data as listed Lab Results  Component Value Date   WBC 3.0 (L) 09/02/2020   HGB 12.6 (L) 09/02/2020   HCT 37.3 (L) 09/02/2020   MCV 101.6 (H) 09/02/2020   PLT 125 (L) 09/02/2020   Recent Labs    06/25/20 0841 06/25/20 0841 07/08/20 0843 07/08/20 0843 07/22/20 0836 07/22/20 0836 08/05/20 0821 08/19/20 0820 09/02/20 0834  NA 138   < > 139   < > 138   < > 139 139 139  K 3.6   < > 3.6   < > 3.6   < > 3.7 3.7 3.5  CL 108   < > 108   < > 107   < > 109 108 108  CO2 24   < > 25   < > 25   < > _0 GLUCOSE 201*   < > 209*   < > 195*   < > 198* 182* 196*  BUN 10   < > 9   < > 10   < >  _0 CREATININE 0.80   < > 0.70    < > 0.89   < > 0.86 0.81 1.01  CALCIUM 8.0*   < > 8.0*   < > 8.1*   < > 8.0* 8.2* 8.4*  GFRNONAA >60   < > >60   < > >60  --  >60 >60 >60  GFRAA >60  --  >60  --  >60  --   --   --   --   PROT 6.5   < > 6.5   < > 6.7   < > 6.2* 6.1* 6.5  ALBUMIN 3.2*   < > 3.1*   < > 3.1*   < > 3.0* 3.0* 3.1*  AST 32   < > 31   < > 29   < > 59* 35 31  ALT 20   < > 19   < > 19   < > 42 25 21  ALKPHOS 121   < > 102   < > 108   < > 118 108 100  BILITOT 0.8   < > 0.7   < > 0.8   < > 1.0 0.7 0.8   < > = values in this interval not displayed.    RADIOGRAPHIC STUDIES: I have personally reviewed the radiological images as listed and agreed with the findings in the report. No results found.  ASSESSMENT & PLAN:   Cancer of right colon ([place]) #Right-sided colon adenocarcinoma-with synchronous metastasis to liver/unresectable. CT scan SEP 4th 2021- Progressive disease-left lower lobe lung nodule 8 mm [previously 4 mm]; increase in size of the hepatic metastases by few millimeters.;  Soft tissue nodule adjacent to anastomotic site again increased by few millimeters.  currently on FOLFIRI + avastin. CEA- NOV- 29;;trending lower Clinically STABLE.   #Proceed with today- # 5 of  FOLFIRI plus Avastin chemotherapy.  Labs today reviewed;  acceptable for treatment today- MILD neutropenia-ANC 1.3; platelets 125. . Will plan CT scan after 6 cycles;will order at next visit.  discussed with patient.   # PN- G-1-2; sec to oxaliplatin.  STABLE  # DISPOSITION:  #FOLFIRI+ Avastin today; pump off in 2 days. # follow up in 2 weeks/ [ MD; labs- cbc/cmp/UA/ CEA; FOLFIRI + avastin;pump off in 2 days -Dr.B  All questions were answered. The patient knows to call the clinic with any problems, questions or concerns.    Cammie Sickle, MD 09/02/2020 2:02 PM

## 2020-09-02 NOTE — Assessment & Plan Note (Signed)
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 CT scan SEP 4th 2021- Progressive disease-left lower lobe lung nodule 8 mm [previously 4 mm]; increase in size of the hepatic metastases by few millimeters.;  Soft tissue nodule adjacent to anastomotic site again increased by few millimeters.  currently on FOLFIRI + avastin. CEA- NOV- 29;;trending lower Clinically STABLE.   #Proceed with today- # 5 of  FOLFIRI plus Avastin chemotherapy.  Labs today reviewed;  acceptable for treatment today- MILD neutropenia-ANC 1.3; platelets 125. . Will plan CT scan after 6 cycles;will order at next visit.  discussed with patient.   # PN- G-1-2; sec to oxaliplatin.  STABLE  # DISPOSITION:  #FOLFIRI+ Avastin today; pump off in 2 days. # follow up in 2 weeks/ [ MD; labs- cbc/cmp/UA/ CEA; FOLFIRI + avastin;pump off in 2 days -Dr.B

## 2020-09-03 LAB — CEA: CEA: 27.2 ng/mL — ABNORMAL HIGH (ref 0.0–4.7)

## 2020-09-04 ENCOUNTER — Inpatient Hospital Stay: Payer: Medicare HMO

## 2020-09-04 ENCOUNTER — Other Ambulatory Visit: Payer: Self-pay

## 2020-09-04 VITALS — BP 130/70 | HR 85 | Temp 99.0°F | Resp 17

## 2020-09-04 DIAGNOSIS — Z5112 Encounter for antineoplastic immunotherapy: Secondary | ICD-10-CM | POA: Diagnosis not present

## 2020-09-04 DIAGNOSIS — C182 Malignant neoplasm of ascending colon: Secondary | ICD-10-CM | POA: Diagnosis not present

## 2020-09-04 DIAGNOSIS — Z7189 Other specified counseling: Secondary | ICD-10-CM

## 2020-09-04 DIAGNOSIS — C787 Secondary malignant neoplasm of liver and intrahepatic bile duct: Secondary | ICD-10-CM | POA: Diagnosis not present

## 2020-09-04 DIAGNOSIS — Z5111 Encounter for antineoplastic chemotherapy: Secondary | ICD-10-CM | POA: Diagnosis not present

## 2020-09-04 MED ORDER — HEPARIN SOD (PORK) LOCK FLUSH 100 UNIT/ML IV SOLN
500.0000 [IU] | Freq: Once | INTRAVENOUS | Status: AC | PRN
  Administered 2020-09-04: 500 [IU]
  Filled 2020-09-04: qty 5

## 2020-09-04 MED ORDER — HEPARIN SOD (PORK) LOCK FLUSH 100 UNIT/ML IV SOLN
INTRAVENOUS | Status: AC
  Filled 2020-09-04: qty 5

## 2020-09-04 MED ORDER — SODIUM CHLORIDE 0.9% FLUSH
10.0000 mL | INTRAVENOUS | Status: DC | PRN
  Administered 2020-09-04: 10 mL
  Filled 2020-09-04: qty 10

## 2020-09-16 ENCOUNTER — Inpatient Hospital Stay: Payer: Medicare HMO

## 2020-09-16 ENCOUNTER — Inpatient Hospital Stay (HOSPITAL_BASED_OUTPATIENT_CLINIC_OR_DEPARTMENT_OTHER): Payer: Medicare HMO | Admitting: Internal Medicine

## 2020-09-16 VITALS — BP 127/62 | HR 73 | Temp 98.1°F | Resp 16 | Ht 72.0 in | Wt 194.0 lb

## 2020-09-16 DIAGNOSIS — C182 Malignant neoplasm of ascending colon: Secondary | ICD-10-CM | POA: Diagnosis not present

## 2020-09-16 DIAGNOSIS — Z7189 Other specified counseling: Secondary | ICD-10-CM

## 2020-09-16 DIAGNOSIS — C787 Secondary malignant neoplasm of liver and intrahepatic bile duct: Secondary | ICD-10-CM | POA: Diagnosis not present

## 2020-09-16 DIAGNOSIS — Z5112 Encounter for antineoplastic immunotherapy: Secondary | ICD-10-CM | POA: Diagnosis not present

## 2020-09-16 DIAGNOSIS — Z5111 Encounter for antineoplastic chemotherapy: Secondary | ICD-10-CM | POA: Diagnosis not present

## 2020-09-16 LAB — URINALYSIS, COMPLETE (UACMP) WITH MICROSCOPIC
Bacteria, UA: NONE SEEN
Bilirubin Urine: NEGATIVE
Glucose, UA: NEGATIVE mg/dL
Hgb urine dipstick: NEGATIVE
Ketones, ur: 5 mg/dL — AB
Leukocytes,Ua: NEGATIVE
Nitrite: NEGATIVE
Protein, ur: 30 mg/dL — AB
Specific Gravity, Urine: 1.03 (ref 1.005–1.030)
pH: 5 (ref 5.0–8.0)

## 2020-09-16 LAB — COMPREHENSIVE METABOLIC PANEL
ALT: 20 U/L (ref 0–44)
AST: 29 U/L (ref 15–41)
Albumin: 2.9 g/dL — ABNORMAL LOW (ref 3.5–5.0)
Alkaline Phosphatase: 118 U/L (ref 38–126)
Anion gap: 8 (ref 5–15)
BUN: 13 mg/dL (ref 8–23)
CO2: 25 mmol/L (ref 22–32)
Calcium: 8.4 mg/dL — ABNORMAL LOW (ref 8.9–10.3)
Chloride: 109 mmol/L (ref 98–111)
Creatinine, Ser: 0.75 mg/dL (ref 0.61–1.24)
GFR, Estimated: >60 mL/min (ref >60)
Glucose, Bld: 176 mg/dL — ABNORMAL HIGH (ref 70–99)
Potassium: 3.7 mmol/L (ref 3.5–5.1)
Sodium: 142 mmol/L (ref 135–145)
Total Bilirubin: 0.8 mg/dL (ref 0.3–1.2)
Total Protein: 5.9 g/dL — ABNORMAL LOW (ref 6.5–8.1)

## 2020-09-16 LAB — CBC WITH DIFFERENTIAL/PLATELET
Abs Immature Granulocytes: 0.01 K/uL (ref 0.00–0.07)
Basophils Absolute: 0 K/uL (ref 0.0–0.1)
Basophils Relative: 1 %
Eosinophils Absolute: 0.2 K/uL (ref 0.0–0.5)
Eosinophils Relative: 7 %
HCT: 35.1 % — ABNORMAL LOW (ref 39.0–52.0)
Hemoglobin: 11.9 g/dL — ABNORMAL LOW (ref 13.0–17.0)
Immature Granulocytes: 0 %
Lymphocytes Relative: 35 %
Lymphs Abs: 1.2 K/uL (ref 0.7–4.0)
MCH: 34.4 pg — ABNORMAL HIGH (ref 26.0–34.0)
MCHC: 33.9 g/dL (ref 30.0–36.0)
MCV: 101.4 fL — ABNORMAL HIGH (ref 80.0–100.0)
Monocytes Absolute: 0.5 K/uL (ref 0.1–1.0)
Monocytes Relative: 14 %
Neutro Abs: 1.5 K/uL — ABNORMAL LOW (ref 1.7–7.7)
Neutrophils Relative %: 43 %
Platelets: 136 K/uL — ABNORMAL LOW (ref 150–400)
RBC: 3.46 MIL/uL — ABNORMAL LOW (ref 4.22–5.81)
RDW: 15 % (ref 11.5–15.5)
WBC: 3.4 K/uL — ABNORMAL LOW (ref 4.0–10.5)
nRBC: 0 % (ref 0.0–0.2)

## 2020-09-16 MED ORDER — DIPHENOXYLATE-ATROPINE 2.5-0.025 MG PO TABS
1.0000 | ORAL_TABLET | Freq: Once | ORAL | Status: AC
  Administered 2020-09-16: 1 via ORAL
  Filled 2020-09-16: qty 1

## 2020-09-16 MED ORDER — SODIUM CHLORIDE 0.9 % IV SOLN
5.0000 mg/kg | Freq: Once | INTRAVENOUS | Status: AC
  Administered 2020-09-16: 400 mg via INTRAVENOUS
  Filled 2020-09-16: qty 16

## 2020-09-16 MED ORDER — SODIUM CHLORIDE 0.9% FLUSH
10.0000 mL | INTRAVENOUS | Status: DC | PRN
  Administered 2020-09-16: 10 mL via INTRAVENOUS
  Filled 2020-09-16: qty 10

## 2020-09-16 MED ORDER — SODIUM CHLORIDE 0.9 % IV SOLN
180.0000 mg/m2 | Freq: Once | INTRAVENOUS | Status: AC
  Administered 2020-09-16: 380 mg via INTRAVENOUS
  Filled 2020-09-16: qty 15

## 2020-09-16 MED ORDER — SODIUM CHLORIDE 0.9 % IV SOLN
Freq: Once | INTRAVENOUS | Status: AC
  Filled 2020-09-16: qty 250

## 2020-09-16 MED ORDER — PALONOSETRON HCL INJECTION 0.25 MG/5ML
0.2500 mg | Freq: Once | INTRAVENOUS | Status: AC
  Administered 2020-09-16: 0.25 mg via INTRAVENOUS
  Filled 2020-09-16: qty 5

## 2020-09-16 MED ORDER — SODIUM CHLORIDE 0.9 % IV SOLN
10.0000 mg | Freq: Once | INTRAVENOUS | Status: AC
  Administered 2020-09-16: 10 mg via INTRAVENOUS
  Filled 2020-09-16: qty 10

## 2020-09-16 MED ORDER — SODIUM CHLORIDE 0.9 % IV SOLN
5000.0000 mg | INTRAVENOUS | Status: DC
  Administered 2020-09-16: 5000 mg via INTRAVENOUS
  Filled 2020-09-16: qty 100

## 2020-09-16 NOTE — Assessment & Plan Note (Addendum)
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 CT scan SEP 4th 2021- Progressive disease-left lower lobe lung nodule 8 mm [previously 4 mm]; increase in size of the hepatic metastases by few millimeters.;  Soft tissue nodule adjacent to anastomotic site again increased by few millimeters.  currently on FOLFIRI + avastin. CEA- NOV- 29;trending lower Clinically STABLE.   #Proceed with today- # 6 of  FOLFIRI plus Avastin chemotherapy.  Labs today reviewed;  acceptable for treatment today- MILD neutropenia-ANC 1.5; platelets 136. Will plan CT scan after 6 cycles; will order CT today.  discussed with patient.   # PN- G-1-2; sec to oxaliplatin.  STABLE  # DISPOSITION:  #FOLFIRI+ Avastin today; pump off in 2 days. # follow up in 2 weeks- MD; labs- cbc/cmp/UA/ CEA; FOLFIRI + avastin;pump off in 2 days'; CT CAP prior- -Dr.B

## 2020-09-16 NOTE — Progress Notes (Signed)
Pt stable at discharge.  

## 2020-09-16 NOTE — Progress Notes (Signed)
[place] NOTE  Patient Care Team: Sofie Hartigan, MD as PCP - General (Family Medicine) [name], MD as Consulting Physician (Hematology and Oncology)  CHIEF COMPLAINTS/PURPOSE OF CONSULTATION: Colon cancer  #  Oncology History Overview Note  # MAY 2020- 3. 03/09/19 Liver biopsy. Microscopic examination shows malignant cells with glandular architecture consistent with adenocarcinoma. The malignant cells are positive for CK20 and CDX-2. These findings support the clinical impression of metastatic colon adenocarcinoma. 4. 03/10/19 R hemicolectomy. Tumor site cecum. Adenocarcinoma. Mucinous features present. G2. No tumor deposits. Invades visceral peritoneum. No tumor perforation. LVI present. PNI not identified. All margins uninvolved. 1/12 LNs. PT4apN1. Periappendiceal inflammation c/w resolving abscess. Microsatellite stable (MSS). [Dr.Mettu; [facility]]  # SEP 4th 2020 [compared to May 2020]  Interval increase in size of the metastases to the hepatic dome, The metastasis to the left hepatic lobe is unchanged; 2.  New subcentimeter hypoattenuating lesion in the inferior right hepatic lobe, incompletely characterized on CT. 3.  Postsurgical changes following right hemicolectomy.  # OCT 2020- FOLFOX +avastin; CT dec 22nd 2020- [compared to [facility] sep 9th 2020]-Liver- slight progression versus stable disease; CT scan SEP 4th 2021- Progressive disease-left lower lobe lung nodule 8 mm [previously 4 mm]; increase in size of the hepatic metastases by few millimeters.;  Soft tissue nodule adjacent to anastomotic site again increased by few millimeters.STOP FOLFOX; cont avastin  #  SEP 20th, 2021- FOLFIRI+ AVASTIN  # NGS/F-ONE-MUTATED K-RAS [G]  # PALLIATIVE CARE EVALUATION: 09/20/2019-Oscar [name]  # PAIN MANAGEMENT: NA   DIAGNOSIS: COLON CANCER  STAGE:  IV     ;  GOALS:Palliative  CURRENT/MOST RECENT THERAPY : FOLFIRI+ avastin [C]    Cancer of right colon ([place])  07/05/2019  Initial Diagnosis   Cancer of right colon ([place])   07/24/2019 - 06/25/2020 Chemotherapy   The patient had dexamethasone (DECADRON) 4 MG tablet, 8 mg, Oral, Daily, 1 of 1 cycle, Start date: --, End date: -- palonosetron (ALOXI) injection 0.25 mg, 0.25 mg, Intravenous,  Once, 23 of 25 cycles Administration: 0.25 mg (07/24/2019), 0.25 mg (08/07/2019), 0.25 mg (08/21/2019), 0.25 mg (09/04/2019), 0.25 mg (09/20/2019), 0.25 mg (10/04/2019), 0.25 mg (10/16/2019), 0.25 mg (10/30/2019), 0.25 mg (11/22/2019), 0.25 mg (12/06/2019), 0.25 mg (12/20/2019), 0.25 mg (01/03/2020), 0.25 mg (01/29/2020), 0.25 mg (02/12/2020), 0.25 mg (02/26/2020), 0.25 mg (03/11/2020), 0.25 mg (03/25/2020), 0.25 mg (04/08/2020), 0.25 mg (04/24/2020), 0.25 mg (05/08/2020), 0.25 mg (05/22/2020), 0.25 mg (06/10/2020), 0.25 mg (06/25/2020) leucovorin 800 mg in dextrose 5 % 250 mL infusion, 844 mg, Intravenous,  Once, 23 of 25 cycles Administration: 800 mg (07/24/2019), 800 mg (08/07/2019), 800 mg (08/21/2019), 800 mg (09/04/2019), 800 mg (09/20/2019), 800 mg (10/04/2019), 800 mg (10/16/2019), 800 mg (10/30/2019), 800 mg (11/22/2019), 800 mg (12/06/2019), 800 mg (12/20/2019), 800 mg (01/03/2020), 800 mg (01/29/2020), 800 mg (02/12/2020), 800 mg (02/26/2020), 800 mg (03/11/2020), 800 mg (03/25/2020), 800 mg (04/08/2020), 800 mg (04/24/2020), 800 mg (05/08/2020), 800 mg (05/22/2020), 800 mg (06/10/2020), 800 mg (06/25/2020) oxaliplatin (ELOXATIN) 180 mg in dextrose 5 % 500 mL chemo infusion, 85 mg/m2 = 180 mg, Intravenous,  Once, 23 of 25 cycles Dose modification: 178 mg (original dose 85 mg/m2, Cycle 17, Reason: Other (see comments), Comment: insurance adjusted dose ) Administration: 180 mg (07/24/2019), 180 mg (08/07/2019), 180 mg (08/21/2019), 180 mg (09/04/2019), 180 mg (09/20/2019), 180 mg (10/04/2019), 180 mg (10/16/2019), 180 mg (10/30/2019), 180 mg (11/22/2019), 180 mg (12/06/2019), 180 mg (12/20/2019), 180 mg (01/03/2020), 180 mg (01/29/2020), 180 mg (02/12/2020), 180 mg (02/26/2020), 180  mg (03/11/2020),  180 mg (04/08/2020), 180 mg (04/24/2020), 180 mg (05/08/2020), 180 mg (05/22/2020), 180 mg (06/10/2020), 180 mg (06/25/2020) fluorouracil (ADRUCIL) 5,000 mg in sodium chloride 0.9 % 150 mL chemo infusion, 5,050 mg, Intravenous, 1 Day/Dose, 23 of 25 cycles Administration: 5,000 mg (07/24/2019), 5,000 mg (08/07/2019), 5,000 mg (08/21/2019), 5,050 mg (09/04/2019), 5,000 mg (10/04/2019), 5,000 mg (10/16/2019), 5,000 mg (11/22/2019), 5,000 mg (12/06/2019), 5,000 mg (12/20/2019), 5,000 mg (01/03/2020), 5,000 mg (01/29/2020), 5,000 mg (02/12/2020), 5,000 mg (02/26/2020), 5,000 mg (03/11/2020), 5,000 mg (03/25/2020), 5,000 mg (04/08/2020), 5,000 mg (04/24/2020), 5,000 mg (05/08/2020), 5,000 mg (05/22/2020), 5,000 mg (06/10/2020), 5,000 mg (06/25/2020) bevacizumab-bvzr (ZIRABEV) 400 mg in sodium chloride 0.9 % 100 mL chemo infusion, 5 mg/kg = 400 mg, Intravenous,  Once, 23 of 25 cycles Administration: 400 mg (08/07/2019), 400 mg (08/21/2019), 400 mg (09/04/2019), 400 mg (09/20/2019), 400 mg (10/04/2019), 400 mg (10/16/2019), 400 mg (10/30/2019), 400 mg (11/22/2019), 400 mg (12/06/2019), 400 mg (12/20/2019), 400 mg (01/03/2020), 400 mg (01/29/2020), 400 mg (02/12/2020), 400 mg (02/26/2020), 400 mg (03/11/2020), 400 mg (03/25/2020), 400 mg (04/08/2020), 400 mg (04/24/2020), 400 mg (05/08/2020), 400 mg (05/22/2020), 400 mg (06/10/2020), 400 mg (06/25/2020)  for chemotherapy treatment.    07/08/2020 -  Chemotherapy   The patient had dexamethasone (DECADRON) 4 MG tablet, 8 mg, Oral, Daily, 1 of 1 cycle, Start date: --, End date: -- palonosetron (ALOXI) injection 0.25 mg, 0.25 mg, Intravenous,  Once, 5 of 8 cycles Administration: 0.25 mg (07/08/2020), 0.25 mg (07/22/2020), 0.25 mg (08/05/2020), 0.25 mg (08/19/2020), 0.25 mg (09/02/2020) irinotecan (CAMPTOSAR) 380 mg in sodium chloride 0.9 % 500 mL chemo infusion, 180 mg/m2 = 380 mg, Intravenous,  Once, 5 of 8 cycles Administration: 380 mg (07/08/2020), 380 mg (07/22/2020), 380 mg (08/05/2020), 380 mg (08/19/2020), 380 mg  (09/02/2020) fluorouracil (ADRUCIL) 5,000 mg in sodium chloride 0.9 % 150 mL chemo infusion, 2,369 mg/m2 = 5,050 mg, Intravenous, 1 Day/Dose, 5 of 8 cycles Administration: 5,000 mg (07/08/2020), 5,000 mg (07/22/2020), 5,000 mg (08/05/2020), 5,000 mg (08/19/2020), 5,000 mg (09/02/2020) bevacizumab-bvzr (ZIRABEV) 450 mg in sodium chloride 0.9 % 100 mL chemo infusion, 400 mg, Intravenous,  Once, 5 of 8 cycles Administration: 450 mg (07/08/2020), 450 mg (07/22/2020), 400 mg (08/05/2020), 400 mg (08/19/2020), 400 mg (09/02/2020)  for chemotherapy treatment.      HISTORY OF PRESENTING ILLNESS:  Oscar [name] 79 y.o.  male with metastatic colon cancer to the liver currently on FOLFIRI plus Avastin chemotherapy.  Denies any abdominal pain nausea vomiting diarrhea.  Chronic mild tingling and numbness in the extremities.  Not any worse.  Chronic back pain/shoulder pain.  Review of Systems  Constitutional: Positive for malaise/fatigue. Negative for chills, diaphoresis, fever and weight loss.  HENT: Negative for nosebleeds and sore throat.   Eyes: Negative for double vision.  Respiratory: Negative for cough, hemoptysis, sputum production, shortness of breath and wheezing.   Cardiovascular: Negative for chest pain, palpitations, orthopnea and leg swelling.  Gastrointestinal: Negative for abdominal pain, blood in stool, constipation, diarrhea, heartburn, melena, nausea and vomiting.  Genitourinary: Negative for dysuria, frequency and urgency.  Musculoskeletal: Positive for back pain and joint pain.  Skin: Negative.  Negative for itching and rash.  Neurological: Positive for tingling. Negative for dizziness, focal weakness, weakness and headaches.  Endo/Heme/Allergies: Does not bruise/bleed easily.  Psychiatric/Behavioral: Negative for depression. The patient is not nervous/anxious and does not have insomnia.      MEDICAL HISTORY:  Past Medical History:  Diagnosis Date  . Arthritis   . Cancer (HCC)  colon cancer 02/2019 per pt   . Diabetes mellitus without complication ([place])   . H/O colon cancer, stage IV   . Hyperlipemia   . Hypertension     SURGICAL HISTORY: Past Surgical History:  Procedure Laterality Date  . IR IMAGING GUIDED PORT INSERTION  07/20/2019  . JOINT REPLACEMENT      SOCIAL HISTORY: Social History   Socioeconomic History  . Marital status: Married    Spouse name: Not on file  . Number of children: Not on file  . Years of education: Not on file  . Highest education level: Not on file  Occupational History  . Not on file  Tobacco Use  . Smoking status: Current Every Day Smoker    Packs/day: 0.25    Types: Cigarettes  . Smokeless tobacco: Never Used  . Tobacco comment: 1 to 2 cigarettes a day occasionally  Vaping Use  . Vaping Use: Never used  Substance and Sexual Activity  . Alcohol use: No  . Drug use: No  . Sexual activity: Not on file  Other Topics Concern  . Not on file  Social History Narrative   Recruitment consultant retd; lives in [place]; smoking 3cig/day; [3/4 ppd x started at 9 years]; no alcohol. Son & daughter; wife dementia [waiting for placement].    Social Determinants of Health   Financial Resource Strain:   . Difficulty of Paying Living Expenses: Not on file  Food Insecurity:   . Worried About Charity fundraiser in the Last Year: Not on file  . Ran Out of Food in the Last Year: Not on file  Transportation Needs:   . Lack of Transportation (Medical): Not on file  . Lack of Transportation (Non-Medical): Not on file  Physical Activity:   . Days of Exercise per Week: Not on file  . Minutes of Exercise per Session: Not on file  Stress:   . Feeling of Stress : Not on file  Social Connections:   . Frequency of Communication with Friends and Family: Not on file  . Frequency of Social Gatherings with Friends and Family: Not on file  . Attends Religious Services: Not on file  . Active Member of Clubs or Organizations:  Not on file  . Attends Archivist Meetings: Not on file  . Marital Status: Not on file  Intimate Partner Violence:   . Fear of Current or Ex-Partner: Not on file  . Emotionally Abused: Not on file  . Physically Abused: Not on file  . Sexually Abused: Not on file    FAMILY HISTORY: Family History  Problem Relation Age of Onset  . Peptic Ulcer Disease Father     ALLERGIES:  is allergic to ace inhibitors.  MEDICATIONS:  Current Outpatient Medications  Medication Sig Dispense Refill  . amLODipine (NORVASC) 5 MG tablet Take 5 mg by mouth daily.    . fluticasone (FLONASE) 50 MCG/ACT nasal spray Place 1 spray into both nostrils daily as needed.     . lidocaine-prilocaine (EMLA) cream Apply 1 application topically as needed. 30 g 0  . meloxicam (MOBIC) 15 MG tablet Take 1 tablet (15 mg total) by mouth daily. 90 tablet 0  . pravastatin (PRAVACHOL) 40 MG tablet Take 40 mg by mouth daily.     . prochlorperazine (COMPAZINE) 10 MG tablet Take 1 tablet (10 mg total) by mouth every 6 (six) hours as needed for nausea or vomiting. 40 tablet 1  . tamsulosin (FLOMAX) 0.4 MG CAPS capsule Take 1  capsule (0.4 mg total) by mouth daily. 30 capsule 11  . montelukast (SINGULAIR) 10 MG tablet TAKE 1 TABLET BY MOUTH EVERYDAY AT BEDTIME (Patient not taking: Reported on 03/25/2020) 90 tablet 1  . ondansetron (ZOFRAN-ODT) 4 MG disintegrating tablet Take 1 tablet (4 mg total) by mouth every 6 (six) hours as needed for nausea. (Patient not taking: Reported on 12/05/2019) 20 tablet 0   No current facility-administered medications for this visit.   Facility-Administered Medications Ordered in Other Visits  Medication Dose Route Frequency Provider Last Rate Last Admin  . sodium chloride flush (NS) 0.9 % injection 10 mL  10 mL Intravenous PRN Cammie Sickle, MD   10 mL at 09/16/20 0818      .  PHYSICAL EXAMINATION: ECOG PERFORMANCE STATUS: 0 - Asymptomatic  Vitals:   09/16/20 0829  BP: 127/62   Pulse: 73  Resp: 16  Temp: 98.1 F (36.7 C)  SpO2: 100%   Filed Weights   09/16/20 0829  Weight: 194 lb (88 kg)    Physical Exam HENT:     Head: Normocephalic and atraumatic.     Mouth/Throat:     Pharynx: No oropharyngeal exudate.  Eyes:     Pupils: Pupils are equal, round, and reactive to light.  Cardiovascular:     Rate and Rhythm: Normal rate and regular rhythm.  Pulmonary:     Effort: No respiratory distress.     Breath sounds: No wheezing.  Abdominal:     General: Bowel sounds are normal. There is no distension.     Palpations: Abdomen is soft. There is no mass.     Tenderness: There is no abdominal tenderness. There is no guarding or rebound.  Musculoskeletal:        General: No tenderness. Normal range of motion.     Cervical back: Normal range of motion and neck supple.  Skin:    General: Skin is warm.  Neurological:     Mental Status: He is alert and oriented to person, place, and time.  Psychiatric:        Mood and Affect: Affect normal.    LABORATORY DATA:  I have reviewed the data as listed Lab Results  Component Value Date   WBC 3.4 (L) 09/16/2020   HGB 11.9 (L) 09/16/2020   HCT 35.1 (L) 09/16/2020   MCV 101.4 (H) 09/16/2020   PLT 136 (L) 09/16/2020   Recent Labs    06/25/20 0841 06/25/20 0841 07/08/20 0843 07/08/20 0843 07/22/20 0836 08/05/20 0821 08/19/20 0820 09/02/20 0834 09/16/20 0818  NA 138   < > 139   < > 138   < > 139 139 142  K 3.6   < > 3.6   < > 3.6   < > 3.7 3.5 3.7  CL 108   < > 108   < > 107   < > 108 108 109  CO2 24   < > 25   < > 25   < > $R'26 26 25  'gs$ GLUCOSE 201*   < > 209*   < > 195*   < > 182* 196* 176*  BUN 10   < > 9   < > 10   < > $R'10 18 13  'CM$ CREATININE 0.80   < > 0.70   < > 0.89   < > 0.81 1.01 0.75  CALCIUM 8.0*   < > 8.0*   < > 8.1*   < > 8.2* 8.4* 8.4*  GFRNONAA >60   < > >  60   < > >60   < > >60 >60 >60  GFRAA >60  --  >60  --  >60  --   --   --   --   PROT 6.5   < > 6.5   < > 6.7   < > 6.1* 6.5 5.9*  ALBUMIN  3.2*   < > 3.1*   < > 3.1*   < > 3.0* 3.1* 2.9*  AST 32   < > 31   < > 29   < > 35 31 29  ALT 20   < > 19   < > 19   < > $R'25 21 20  'od$ ALKPHOS 121   < > 102   < > 108   < > 108 100 118  BILITOT 0.8   < > 0.7   < > 0.8   < > 0.7 0.8 0.8   < > = values in this interval not displayed.    RADIOGRAPHIC STUDIES: I have personally reviewed the radiological images as listed and agreed with the findings in the report. No results found.  ASSESSMENT & PLAN:   Cancer of right colon ([place]) #Right-sided colon adenocarcinoma-with synchronous metastasis to liver/unresectable. CT scan SEP 4th 2021- Progressive disease-left lower lobe lung nodule 8 mm [previously 4 mm]; increase in size of the hepatic metastases by few millimeters.;  Soft tissue nodule adjacent to anastomotic site again increased by few millimeters.  currently on FOLFIRI + avastin. CEA- NOV- 29;trending lower Clinically STABLE.   #Proceed with today- # 6 of  FOLFIRI plus Avastin chemotherapy.  Labs today reviewed;  acceptable for treatment today- MILD neutropenia-ANC 1.5; platelets 136. Will plan CT scan after 6 cycles; will order CT today.  discussed with patient.   # PN- G-1-2; sec to oxaliplatin.  STABLE  # DISPOSITION:  #FOLFIRI+ Avastin today; pump off in 2 days. # follow up in 2 weeks- MD; labs- cbc/cmp/UA/ CEA; FOLFIRI + avastin;pump off in 2 days'; CT CAP prior- -Dr.B  All questions were answered. The patient knows to call the clinic with any problems, questions or concerns.    Cammie Sickle, MD 09/16/2020 8:57 AM

## 2020-09-17 LAB — CEA: CEA: 27.2 ng/mL — ABNORMAL HIGH (ref 0.0–4.7)

## 2020-09-18 ENCOUNTER — Other Ambulatory Visit: Payer: Self-pay

## 2020-09-18 ENCOUNTER — Inpatient Hospital Stay: Payer: Medicare HMO | Attending: Oncology

## 2020-09-18 DIAGNOSIS — Z5112 Encounter for antineoplastic immunotherapy: Secondary | ICD-10-CM | POA: Diagnosis not present

## 2020-09-18 DIAGNOSIS — C182 Malignant neoplasm of ascending colon: Secondary | ICD-10-CM | POA: Diagnosis not present

## 2020-09-18 DIAGNOSIS — Z5111 Encounter for antineoplastic chemotherapy: Secondary | ICD-10-CM | POA: Insufficient documentation

## 2020-09-18 DIAGNOSIS — Z7189 Other specified counseling: Secondary | ICD-10-CM

## 2020-09-18 DIAGNOSIS — Z452 Encounter for adjustment and management of vascular access device: Secondary | ICD-10-CM | POA: Insufficient documentation

## 2020-09-18 DIAGNOSIS — C787 Secondary malignant neoplasm of liver and intrahepatic bile duct: Secondary | ICD-10-CM | POA: Diagnosis not present

## 2020-09-18 MED ORDER — HEPARIN SOD (PORK) LOCK FLUSH 100 UNIT/ML IV SOLN
500.0000 [IU] | Freq: Once | INTRAVENOUS | Status: AC | PRN
  Administered 2020-09-18: 500 [IU]
  Filled 2020-09-18: qty 5

## 2020-09-18 MED ORDER — SODIUM CHLORIDE 0.9% FLUSH
10.0000 mL | INTRAVENOUS | Status: DC | PRN
  Administered 2020-09-18: 10 mL
  Filled 2020-09-18: qty 10

## 2020-09-18 MED ORDER — HEPARIN SOD (PORK) LOCK FLUSH 100 UNIT/ML IV SOLN
INTRAVENOUS | Status: AC
  Filled 2020-09-18: qty 5

## 2020-09-24 ENCOUNTER — Other Ambulatory Visit: Payer: Self-pay

## 2020-09-24 ENCOUNTER — Ambulatory Visit
Admission: RE | Admit: 2020-09-24 | Discharge: 2020-09-24 | Disposition: A | Discharge status: Home / Self Care | Payer: Medicare HMO | Source: Ambulatory Visit | Attending: Internal Medicine | Admitting: Internal Medicine

## 2020-09-24 DIAGNOSIS — C182 Malignant neoplasm of ascending colon: Secondary | ICD-10-CM | POA: Insufficient documentation

## 2020-09-24 DIAGNOSIS — C787 Secondary malignant neoplasm of liver and intrahepatic bile duct: Secondary | ICD-10-CM | POA: Diagnosis not present

## 2020-09-24 DIAGNOSIS — N200 Calculus of kidney: Secondary | ICD-10-CM | POA: Diagnosis not present

## 2020-09-24 DIAGNOSIS — I700 Atherosclerosis of aorta: Secondary | ICD-10-CM | POA: Diagnosis not present

## 2020-09-24 DIAGNOSIS — I2510 Atherosclerotic heart disease of native coronary artery without angina pectoris: Secondary | ICD-10-CM | POA: Diagnosis not present

## 2020-09-24 IMAGING — CT CT CHEST-ABD-PELV W/ CM
2 of 3 series · 12 of 30 positions shown, 18 images · IV contrast (omnipaque)
Comparison: [DATE] abdominopelvic CT. PET [DATE]. Most
recent diagnostic chest CT [DATE].

CLINICAL DATA: Right-sided colon cancer with metastasis to liver.
Evaluate treatment response.

EXAM:
CT CHEST, ABDOMEN, AND PELVIS WITH CONTRAST
TECHNIQUE: Multidetector CT imaging of the chest, abdomen and pelvis was
performed following the standard protocol during bolus
administration of intravenous contrast.
CONTRAST:  100mL OMNIPAQUE IOHEXOL 300 MG/ML  SOLN

[Series 2: cap with · axial · 0.84mm/px · z∈[-1069,-489]mm · 10 of 146 slices shown, 16 images]
[im 15/146  mediastinal]
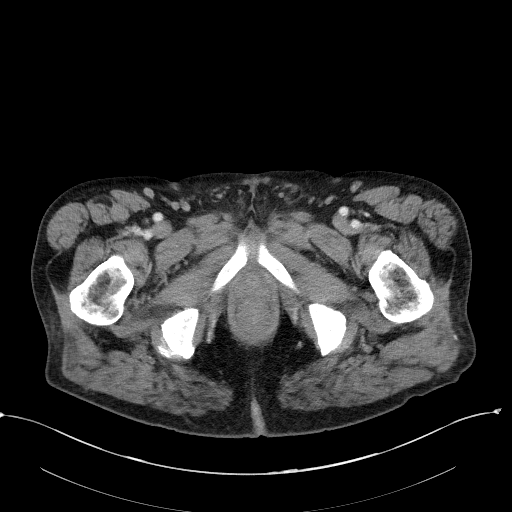
[im 15/146  bone]
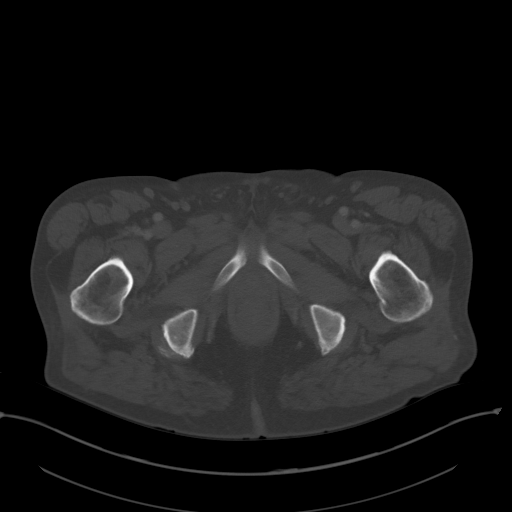
[im 30/146  mediastinal]
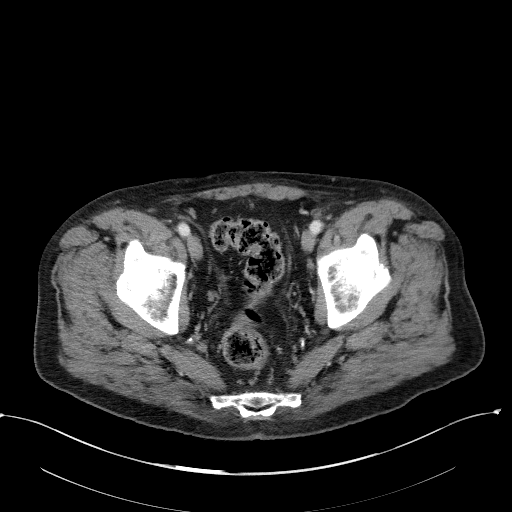
[im 44/146  mediastinal]
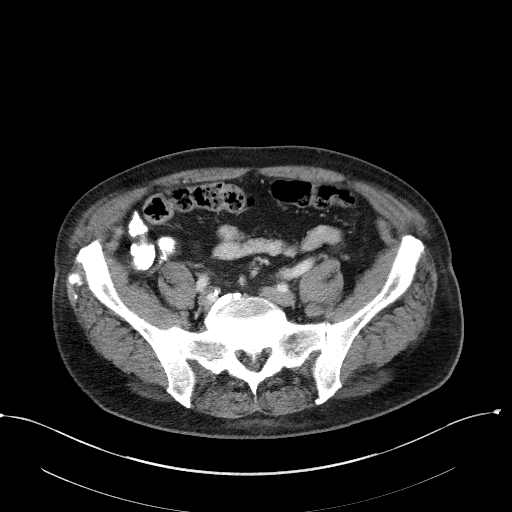
[im 59/146  mediastinal]
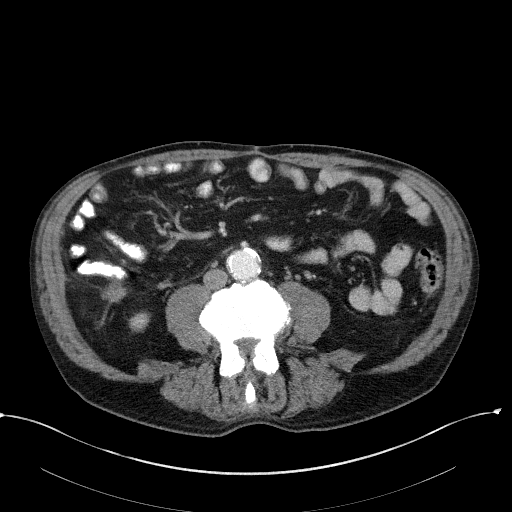
[im 72/146  mediastinal]
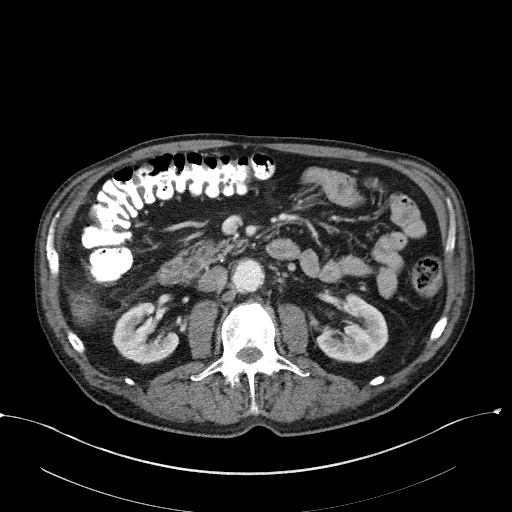
[im 73/146  mediastinal]
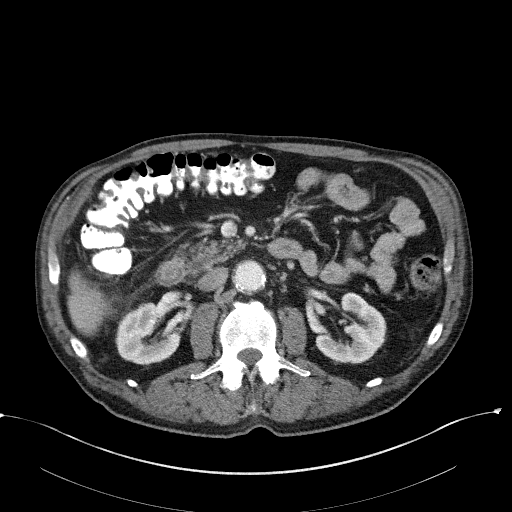
[im 88/146  mediastinal]
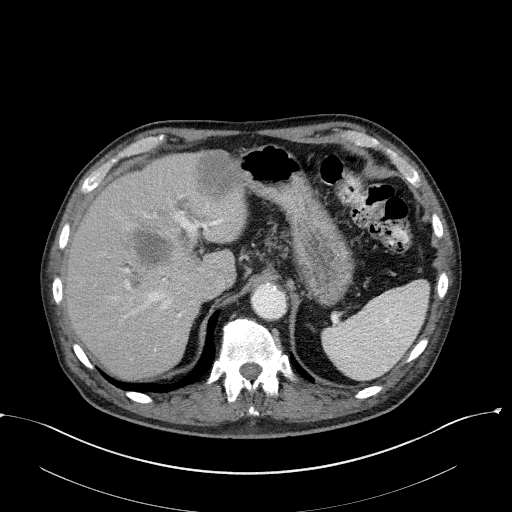
[im 88/146  lung]
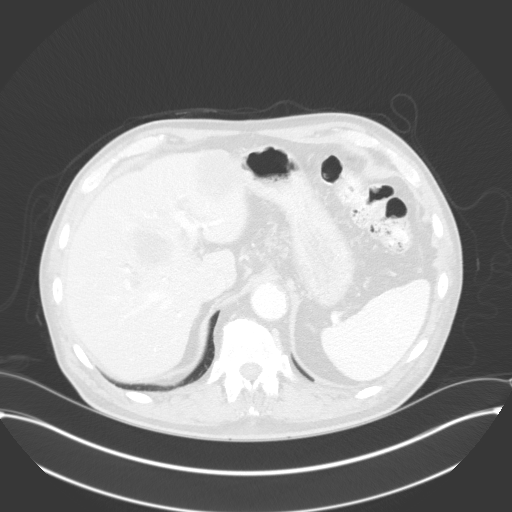
[im 102/146  mediastinal]
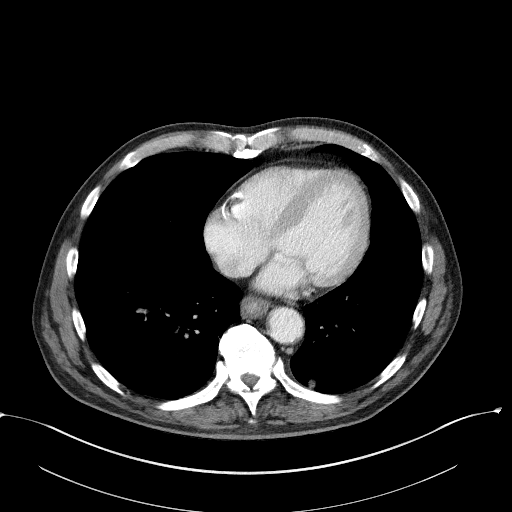
[im 102/146  lung]
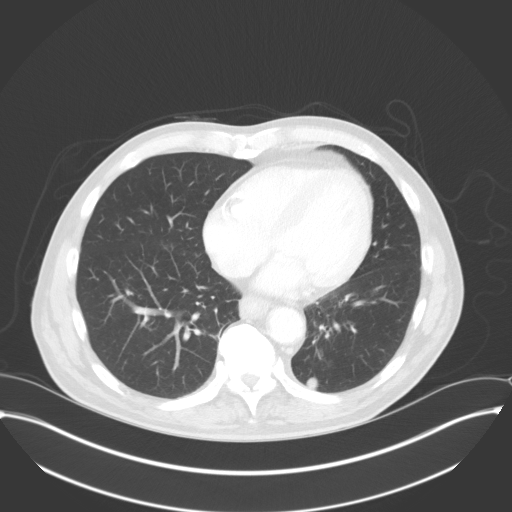
[im 117/146  mediastinal]
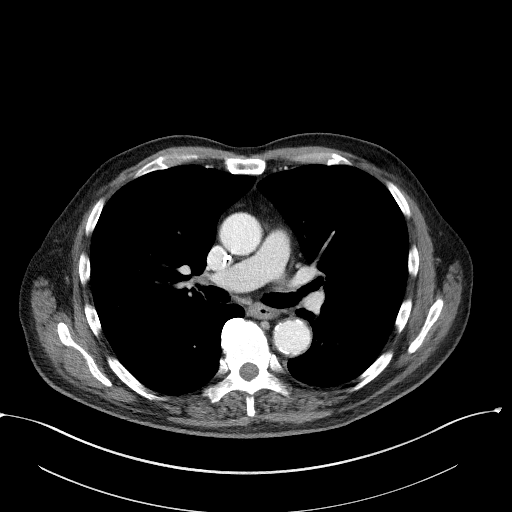
[im 117/146  lung]
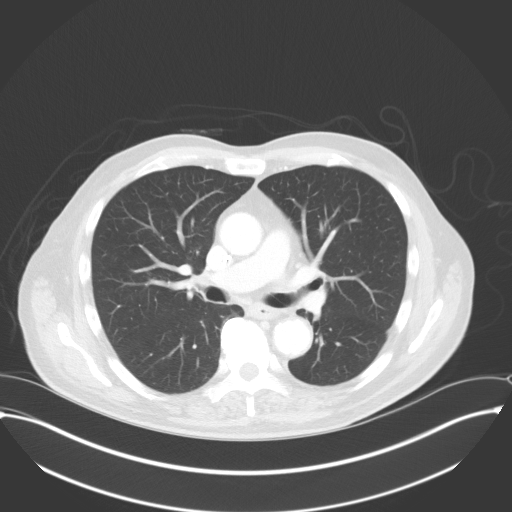
[im 117/146  bone]
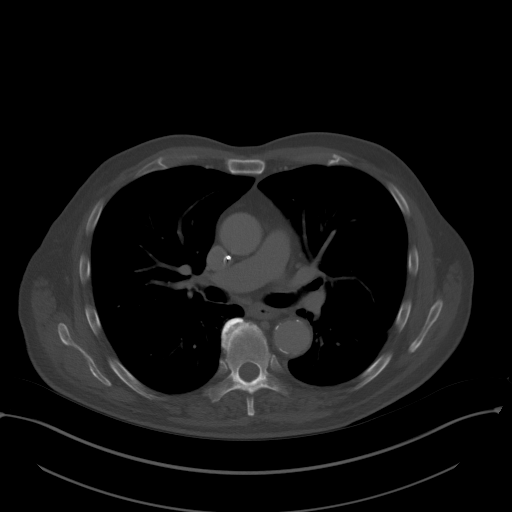
[im 131/146  mediastinal]
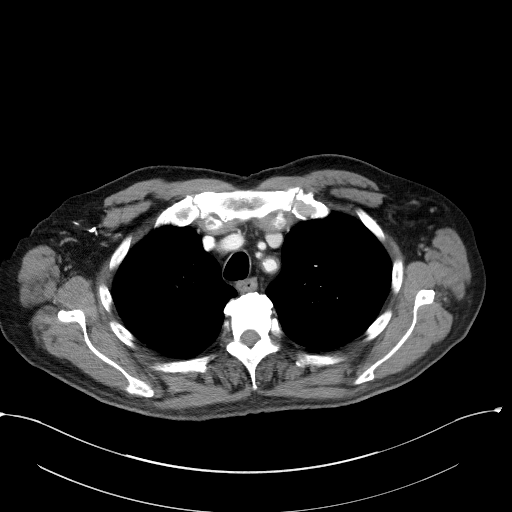
[im 131/146  lung]
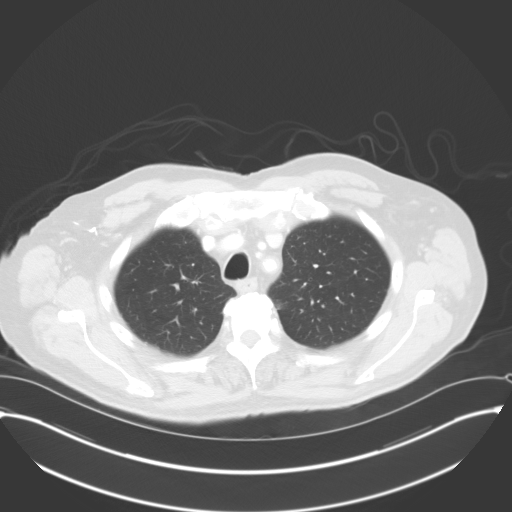

[Series 4: lung · axial · 0.84mm/px · z∈[-728,-672]mm · 2 of 172 slices shown]
[im 15/172  bone]
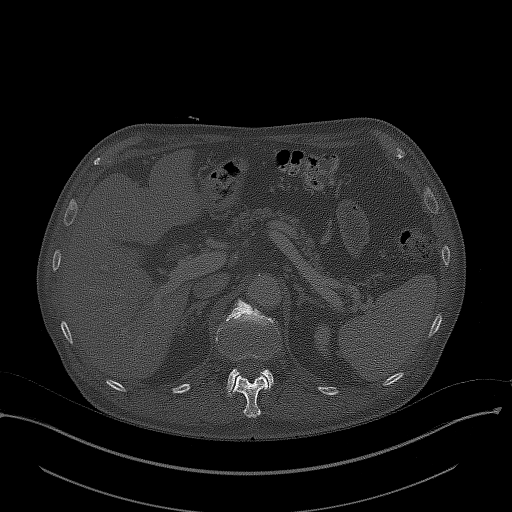
[im 43/172  bone]
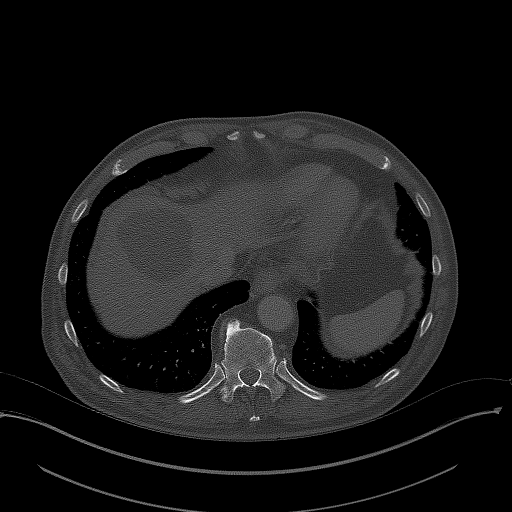

[12 of 30 positions shown; findings below may reference images not displayed]

FINDINGS: CT CHEST FINDINGS

Cardiovascular: Right Port-A-Cath tip superior caval/atrial
junction.

Aortic atherosclerosis. Tortuous thoracic aorta. Normal heart size,
without pericardial effusion. Multivessel coronary artery
atherosclerosis. No central pulmonary embolism, on this
non-dedicated study.

Mediastinum/Nodes: No supraclavicular adenopathy. No mediastinal or
hilar adenopathy.

Lungs/Pleura: No pleural fluid. Presumed secretions within the
endobronchial tree.

Bilateral pulmonary nodules. A pleural-based left lower lobe
pulmonary nodule measures 1.0 cm on 112/4 versus 8 mm on [DATE].

An inferior right upper lobe subpleural pulmonary nodule of 3 mm on
85/4 is not included on [DATE] but has enlarged from 1 mm on
[DATE] PET.

Left upper lobe 4 mm nodule on 59/4 is either new or has enlarged
from 2 mm on [DATE].

Musculoskeletal: No acute osseous abnormality.

CT ABDOMEN PELVIS FINDINGS

Hepatobiliary: Segment 4A dominant 6.2 cm hepatic cyst again
identified.

Subcapsular right hepatic lobe measures 8 mm on 69/2 versus 10 mm on
the prior exam.

Segment 2 metastasis measures 3.9 x 3.6 cm on 58/2 versus similar on
the prior exam.

No new liver lesion. Implant along the capsule of the anterior left
hepatic lobe measures 5 mm on [DATE] and is similar to on the prior.

Normal gallbladder. Borderline common duct dilatation at 8 mm,
decreased from 10 mm previously.

Pancreas: Normal, without mass or ductal dilatation.

Spleen: Normal in size, without focal abnormality.

Adrenals/Urinary Tract: Normal adrenal glands. Upper pole 3 mm left
renal collecting system calculus.

4.2 cm lower pole left renal cyst.  Normal urinary bladder.

Stomach/Bowel: Gastric antral underdistention. Right hemicolectomy.
Normal small bowel.

Vascular/Lymphatic: Advanced aortic and branch vessel
atherosclerosis. No abdominopelvic adenopathy.

Reproductive: Normal prostate.

Other: No significant free fluid. The implant just caudal to the
hemicolectomy anastomosis measures 1.8 x 1.6 cm on 88/2 and is
similar to minimally decreased compared to 1.9 x 1.8 cm on the prior
exam. No new peritoneal metastasis.

Musculoskeletal: Degenerative partial fusion of the bilateral
sacroiliac joints.
IMPRESSION: CT CHEST IMPRESSION

1. Progression of pulmonary metastasis since [DATE].
[DATE]. Coronary artery atherosclerosis. Aortic Atherosclerosis
([RU]-[RU]).

CT ABDOMEN AND PELVIS IMPRESSION

1. Similar to minimal improvement in hepatic metastasis and
perihepatic implant. No new liver lesions.
2. Similar to minimal improvement in peritoneal implant just caudal
to the right hemicolectomy sutures.
3. Left nephrolithiasis.

## 2020-09-24 MED ORDER — IOHEXOL 300 MG/ML  SOLN
100.0000 mL | Freq: Once | INTRAMUSCULAR | Status: AC | PRN
  Administered 2020-09-24: 100 mL via INTRAVENOUS

## 2020-09-30 ENCOUNTER — Inpatient Hospital Stay: Payer: Medicare HMO

## 2020-09-30 ENCOUNTER — Inpatient Hospital Stay: Payer: Medicare HMO | Admitting: Internal Medicine

## 2020-09-30 ENCOUNTER — Other Ambulatory Visit: Payer: Self-pay | Admitting: *Deleted

## 2020-09-30 DIAGNOSIS — C182 Malignant neoplasm of ascending colon: Secondary | ICD-10-CM | POA: Diagnosis not present

## 2020-09-30 DIAGNOSIS — C787 Secondary malignant neoplasm of liver and intrahepatic bile duct: Secondary | ICD-10-CM | POA: Diagnosis not present

## 2020-09-30 DIAGNOSIS — Z452 Encounter for adjustment and management of vascular access device: Secondary | ICD-10-CM | POA: Diagnosis not present

## 2020-09-30 DIAGNOSIS — Z7189 Other specified counseling: Secondary | ICD-10-CM

## 2020-09-30 DIAGNOSIS — Z5111 Encounter for antineoplastic chemotherapy: Secondary | ICD-10-CM | POA: Diagnosis not present

## 2020-09-30 DIAGNOSIS — Z5112 Encounter for antineoplastic immunotherapy: Secondary | ICD-10-CM | POA: Diagnosis not present

## 2020-09-30 LAB — COMPREHENSIVE METABOLIC PANEL
ALT: 20 U/L (ref 0–44)
AST: 32 U/L (ref 15–41)
Albumin: 3 g/dL — ABNORMAL LOW (ref 3.5–5.0)
Alkaline Phosphatase: 90 U/L (ref 38–126)
Anion gap: 7 (ref 5–15)
BUN: 10 mg/dL (ref 8–23)
CO2: 24 mmol/L (ref 22–32)
Calcium: 8.1 mg/dL — ABNORMAL LOW (ref 8.9–10.3)
Chloride: 105 mmol/L (ref 98–111)
Creatinine, Ser: 0.74 mg/dL (ref 0.61–1.24)
GFR, Estimated: >60 mL/min (ref >60)
Glucose, Bld: 206 mg/dL — ABNORMAL HIGH (ref 70–99)
Potassium: 3.7 mmol/L (ref 3.5–5.1)
Sodium: 136 mmol/L (ref 135–145)
Total Bilirubin: 0.9 mg/dL (ref 0.3–1.2)
Total Protein: 5.9 g/dL — ABNORMAL LOW (ref 6.5–8.1)

## 2020-09-30 LAB — URINALYSIS, COMPLETE (UACMP) WITH MICROSCOPIC
Bacteria, UA: NONE SEEN
Bilirubin Urine: NEGATIVE
Glucose, UA: NEGATIVE mg/dL
Hgb urine dipstick: NEGATIVE
Ketones, ur: NEGATIVE mg/dL
Leukocytes,Ua: NEGATIVE
Nitrite: NEGATIVE
Protein, ur: NEGATIVE mg/dL
Specific Gravity, Urine: 1.011 (ref 1.005–1.030)
pH: 7 (ref 5.0–8.0)

## 2020-09-30 LAB — CBC WITH DIFFERENTIAL/PLATELET
Abs Immature Granulocytes: 0 10*3/uL (ref 0.00–0.07)
Basophils Absolute: 0.1 10*3/uL (ref 0.0–0.1)
Basophils Relative: 2 %
Eosinophils Absolute: 0.3 10*3/uL (ref 0.0–0.5)
Eosinophils Relative: 8 %
HCT: 35.6 % — ABNORMAL LOW (ref 39.0–52.0)
Hemoglobin: 12.1 g/dL — ABNORMAL LOW (ref 13.0–17.0)
Immature Granulocytes: 0 %
Lymphocytes Relative: 32 %
Lymphs Abs: 1.1 10*3/uL (ref 0.7–4.0)
MCH: 34.7 pg — ABNORMAL HIGH (ref 26.0–34.0)
MCHC: 34 g/dL (ref 30.0–36.0)
MCV: 102 fL — ABNORMAL HIGH (ref 80.0–100.0)
Monocytes Absolute: 0.4 10*3/uL (ref 0.1–1.0)
Monocytes Relative: 13 %
Neutro Abs: 1.5 10*3/uL — ABNORMAL LOW (ref 1.7–7.7)
Neutrophils Relative %: 45 %
Platelets: 128 10*3/uL — ABNORMAL LOW (ref 150–400)
RBC: 3.49 MIL/uL — ABNORMAL LOW (ref 4.22–5.81)
RDW: 14.8 % (ref 11.5–15.5)
WBC: 3.3 10*3/uL — ABNORMAL LOW (ref 4.0–10.5)
nRBC: 0 % (ref 0.0–0.2)

## 2020-09-30 MED ORDER — SODIUM CHLORIDE 0.9 % IV SOLN
Freq: Once | INTRAVENOUS | Status: AC
  Filled 2020-09-30: qty 250

## 2020-09-30 MED ORDER — SODIUM CHLORIDE 0.9 % IV SOLN
5.0000 mg/kg | Freq: Once | INTRAVENOUS | Status: AC
  Administered 2020-09-30: 10:00:00 400 mg via INTRAVENOUS
  Filled 2020-09-30: qty 16

## 2020-09-30 MED ORDER — SODIUM CHLORIDE 0.9% FLUSH
10.0000 mL | Freq: Once | INTRAVENOUS | Status: AC
  Administered 2020-09-30: 08:00:00 10 mL via INTRAVENOUS
  Filled 2020-09-30: qty 10

## 2020-09-30 MED ORDER — SODIUM CHLORIDE 0.9 % IV SOLN
180.0000 mg/m2 | Freq: Once | INTRAVENOUS | Status: AC
  Administered 2020-09-30: 11:00:00 380 mg via INTRAVENOUS
  Filled 2020-09-30: qty 4

## 2020-09-30 MED ORDER — PALONOSETRON HCL INJECTION 0.25 MG/5ML
0.2500 mg | Freq: Once | INTRAVENOUS | Status: AC
  Administered 2020-09-30: 10:00:00 0.25 mg via INTRAVENOUS
  Filled 2020-09-30: qty 5

## 2020-09-30 MED ORDER — DIPHENOXYLATE-ATROPINE 2.5-0.025 MG PO TABS
1.0000 | ORAL_TABLET | Freq: Once | ORAL | Status: AC
  Administered 2020-09-30: 10:00:00 1 via ORAL
  Filled 2020-09-30: qty 1

## 2020-09-30 MED ORDER — MELOXICAM 15 MG PO TABS: 15.0000 mg | ORAL_TABLET | Freq: Every day | ORAL | 0 refills | Status: DC

## 2020-09-30 MED ORDER — SODIUM CHLORIDE 0.9 % IV SOLN
5000.0000 mg | INTRAVENOUS | Status: DC
  Administered 2020-09-30: 12:00:00 5000 mg via INTRAVENOUS
  Filled 2020-09-30: qty 100

## 2020-09-30 MED ORDER — SODIUM CHLORIDE 0.9 % IV SOLN
10.0000 mg | Freq: Once | INTRAVENOUS | Status: AC
  Administered 2020-09-30: 10:00:00 10 mg via INTRAVENOUS
  Filled 2020-09-30: qty 10

## 2020-09-30 NOTE — Progress Notes (Signed)
Oscar [name] tolerated his treatment today without any complications.

## 2020-09-30 NOTE — Assessment & Plan Note (Addendum)
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 CT scan - DEC 7th, 2021-mild progression of the pulmonary metastases [but again less than centimeter nodules]; minimal improvement to stable liver lesions; peritoneal implant next anastomosis.  Given limited treatment options moving forward-I think is reasonable to continue FOLFIRI plus Avastin    #Proceed with today- # 7 of  FOLFIRI plus Avastin chemotherapy.  Labs today reviewed;  acceptable for treatment today- MILD neutropenia-ANC 1.5; platelets 128.  # PN- G-1-2; sec to oxaliplatin.  STABLE.   # DISPOSITION:  #FOLFIRI+ Avastin today; pump off in 2 days. # follow up in 2 weeks-X- MD; labs- cbc/cmp/UA/ CEA; FOLFIRI + avastin;pump off in 2 days'; # follow up in 4 weeks- MD; labs- cbc/cmp/UA/ CEA; FOLFIRI + avastin;pump off in 2 days'; -Dr.B   # I reviewed the blood work- with the patient in detail; also reviewed the imaging independently [as summarized above]; and with the patient in detail.

## 2020-09-30 NOTE — Progress Notes (Signed)
[place] NOTE  Patient Care Team: Sofie Hartigan, MD as PCP - General (Family Medicine) [name], MD as Consulting Physician (Hematology and Oncology)  CHIEF COMPLAINTS/PURPOSE OF CONSULTATION: Colon cancer  #  Oncology History Overview Note  # MAY 2020- 3. 03/09/19 Liver biopsy. Microscopic examination shows malignant cells with glandular architecture consistent with adenocarcinoma. The malignant cells are positive for CK20 and CDX-2. These findings support the clinical impression of metastatic colon adenocarcinoma. 4. 03/10/19 R hemicolectomy. Tumor site cecum. Adenocarcinoma. Mucinous features present. G2. No tumor deposits. Invades visceral peritoneum. No tumor perforation. LVI present. PNI not identified. All margins uninvolved. 1/12 LNs. PT4apN1. Periappendiceal inflammation c/w resolving abscess. Microsatellite stable (MSS). [Dr.Mettu; [facility]]  # SEP 4th 2020 [compared to May 2020]  Interval increase in size of the metastases to the hepatic dome, The metastasis to the left hepatic lobe is unchanged; 2.  New subcentimeter hypoattenuating lesion in the inferior right hepatic lobe, incompletely characterized on CT. 3.  Postsurgical changes following right hemicolectomy.  # OCT 2020- FOLFOX +avastin; CT dec 22nd 2020- [compared to [facility] sep 9th 2020]-Liver- slight progression versus stable disease; CT scan SEP 4th 2021- Progressive disease-left lower lobe lung nodule 8 mm [previously 4 mm]; increase in size of the hepatic metastases by few millimeters.;  Soft tissue nodule adjacent to anastomotic site again increased by few millimeters.STOP FOLFOX; cont avastin  #  SEP 20th, 2021- FOLFIRI+ AVASTIN  # NGS/F-ONE-MUTATED K-RAS [G]  # PALLIATIVE CARE EVALUATION: 09/20/2019-Oscar [name]  # PAIN MANAGEMENT: NA   DIAGNOSIS: COLON CANCER  STAGE:  IV     ;  GOALS:Palliative  CURRENT/MOST RECENT THERAPY : FOLFIRI+ avastin [C]    Cancer of right colon ([place])  07/05/2019  Initial Diagnosis   Cancer of right colon ([place])   07/24/2019 - 06/25/2020 Chemotherapy   The patient had dexamethasone (DECADRON) 4 MG tablet, 8 mg, Oral, Daily, 1 of 1 cycle, Start date: --, End date: -- palonosetron (ALOXI) injection 0.25 mg, 0.25 mg, Intravenous,  Once, 23 of 25 cycles Administration: 0.25 mg (07/24/2019), 0.25 mg (08/07/2019), 0.25 mg (08/21/2019), 0.25 mg (09/04/2019), 0.25 mg (09/20/2019), 0.25 mg (10/04/2019), 0.25 mg (10/16/2019), 0.25 mg (10/30/2019), 0.25 mg (11/22/2019), 0.25 mg (12/06/2019), 0.25 mg (12/20/2019), 0.25 mg (01/03/2020), 0.25 mg (01/29/2020), 0.25 mg (02/12/2020), 0.25 mg (02/26/2020), 0.25 mg (03/11/2020), 0.25 mg (03/25/2020), 0.25 mg (04/08/2020), 0.25 mg (04/24/2020), 0.25 mg (05/08/2020), 0.25 mg (05/22/2020), 0.25 mg (06/10/2020), 0.25 mg (06/25/2020) leucovorin 800 mg in dextrose 5 % 250 mL infusion, 844 mg, Intravenous,  Once, 23 of 25 cycles Administration: 800 mg (07/24/2019), 800 mg (08/07/2019), 800 mg (08/21/2019), 800 mg (09/04/2019), 800 mg (09/20/2019), 800 mg (10/04/2019), 800 mg (10/16/2019), 800 mg (10/30/2019), 800 mg (11/22/2019), 800 mg (12/06/2019), 800 mg (12/20/2019), 800 mg (01/03/2020), 800 mg (01/29/2020), 800 mg (02/12/2020), 800 mg (02/26/2020), 800 mg (03/11/2020), 800 mg (03/25/2020), 800 mg (04/08/2020), 800 mg (04/24/2020), 800 mg (05/08/2020), 800 mg (05/22/2020), 800 mg (06/10/2020), 800 mg (06/25/2020) oxaliplatin (ELOXATIN) 180 mg in dextrose 5 % 500 mL chemo infusion, 85 mg/m2 = 180 mg, Intravenous,  Once, 23 of 25 cycles Dose modification: 178 mg (original dose 85 mg/m2, Cycle 17, Reason: Other (see comments), Comment: insurance adjusted dose ) Administration: 180 mg (07/24/2019), 180 mg (08/07/2019), 180 mg (08/21/2019), 180 mg (09/04/2019), 180 mg (09/20/2019), 180 mg (10/04/2019), 180 mg (10/16/2019), 180 mg (10/30/2019), 180 mg (11/22/2019), 180 mg (12/06/2019), 180 mg (12/20/2019), 180 mg (01/03/2020), 180 mg (01/29/2020), 180 mg (02/12/2020), 180 mg (02/26/2020), 180  mg (03/11/2020),  180 mg (04/08/2020), 180 mg (04/24/2020), 180 mg (05/08/2020), 180 mg (05/22/2020), 180 mg (06/10/2020), 180 mg (06/25/2020) fluorouracil (ADRUCIL) 5,000 mg in sodium chloride 0.9 % 150 mL chemo infusion, 5,050 mg, Intravenous, 1 Day/Dose, 23 of 25 cycles Administration: 5,000 mg (07/24/2019), 5,000 mg (08/07/2019), 5,000 mg (08/21/2019), 5,050 mg (09/04/2019), 5,000 mg (10/04/2019), 5,000 mg (10/16/2019), 5,000 mg (11/22/2019), 5,000 mg (12/06/2019), 5,000 mg (12/20/2019), 5,000 mg (01/03/2020), 5,000 mg (01/29/2020), 5,000 mg (02/12/2020), 5,000 mg (02/26/2020), 5,000 mg (03/11/2020), 5,000 mg (03/25/2020), 5,000 mg (04/08/2020), 5,000 mg (04/24/2020), 5,000 mg (05/08/2020), 5,000 mg (05/22/2020), 5,000 mg (06/10/2020), 5,000 mg (06/25/2020) bevacizumab-bvzr (ZIRABEV) 400 mg in sodium chloride 0.9 % 100 mL chemo infusion, 5 mg/kg = 400 mg, Intravenous,  Once, 23 of 25 cycles Administration: 400 mg (08/07/2019), 400 mg (08/21/2019), 400 mg (09/04/2019), 400 mg (09/20/2019), 400 mg (10/04/2019), 400 mg (10/16/2019), 400 mg (10/30/2019), 400 mg (11/22/2019), 400 mg (12/06/2019), 400 mg (12/20/2019), 400 mg (01/03/2020), 400 mg (01/29/2020), 400 mg (02/12/2020), 400 mg (02/26/2020), 400 mg (03/11/2020), 400 mg (03/25/2020), 400 mg (04/08/2020), 400 mg (04/24/2020), 400 mg (05/08/2020), 400 mg (05/22/2020), 400 mg (06/10/2020), 400 mg (06/25/2020)  for chemotherapy treatment.    07/08/2020 -  Chemotherapy   The patient had dexamethasone (DECADRON) 4 MG tablet, 8 mg, Oral, Daily, 1 of 1 cycle, Start date: --, End date: -- palonosetron (ALOXI) injection 0.25 mg, 0.25 mg, Intravenous,  Once, 6 of 11 cycles Administration: 0.25 mg (07/08/2020), 0.25 mg (07/22/2020), 0.25 mg (08/05/2020), 0.25 mg (08/19/2020), 0.25 mg (09/02/2020), 0.25 mg (09/16/2020) irinotecan (CAMPTOSAR) 380 mg in sodium chloride 0.9 % 500 mL chemo infusion, 180 mg/m2 = 380 mg, Intravenous,  Once, 6 of 11 cycles Administration: 380 mg (07/08/2020), 380 mg (07/22/2020), 380 mg (08/05/2020), 380 mg  (08/19/2020), 380 mg (09/02/2020), 380 mg (09/16/2020) fluorouracil (ADRUCIL) 5,000 mg in sodium chloride 0.9 % 150 mL chemo infusion, 2,369 mg/m2 = 5,050 mg, Intravenous, 1 Day/Dose, 6 of 11 cycles Administration: 5,000 mg (07/08/2020), 5,000 mg (07/22/2020), 5,000 mg (08/05/2020), 5,000 mg (08/19/2020), 5,000 mg (09/02/2020), 5,000 mg (09/16/2020) bevacizumab-bvzr (ZIRABEV) 450 mg in sodium chloride 0.9 % 100 mL chemo infusion, 400 mg, Intravenous,  Once, 6 of 11 cycles Administration: 450 mg (07/08/2020), 450 mg (07/22/2020), 400 mg (08/05/2020), 400 mg (08/19/2020), 400 mg (09/02/2020), 400 mg (09/16/2020)  for chemotherapy treatment.      HISTORY OF PRESENTING ILLNESS:  Oscar [name] 79 y.o.  male with metastatic colon cancer to the liver currently on FOLFIRI plus Avastin chemotherapy/review results of CT scan.  Patient denies any nausea vomiting abdominal pain or diarrhea. Mild tingling numbness in the extremities. Not any worse. Chronic back pain not any worse.   Review of Systems  Constitutional: Positive for malaise/fatigue. Negative for chills, diaphoresis, fever and weight loss.  HENT: Negative for nosebleeds and sore throat.   Eyes: Negative for double vision.  Respiratory: Negative for cough, hemoptysis, sputum production, shortness of breath and wheezing.   Cardiovascular: Negative for chest pain, palpitations, orthopnea and leg swelling.  Gastrointestinal: Negative for abdominal pain, blood in stool, constipation, diarrhea, heartburn, melena, nausea and vomiting.  Genitourinary: Negative for dysuria, frequency and urgency.  Musculoskeletal: Positive for back pain and joint pain.  Skin: Negative.  Negative for itching and rash.  Neurological: Positive for tingling. Negative for dizziness, focal weakness, weakness and headaches.  Endo/Heme/Allergies: Does not bruise/bleed easily.  Psychiatric/Behavioral: Negative for depression. The patient is not nervous/anxious and does not have  insomnia.  MEDICAL HISTORY:  Past Medical History:  Diagnosis Date  . Arthritis   . Cancer ([place])    colon cancer 02/2019 per pt   . Diabetes mellitus without complication ([place])   . H/O colon cancer, stage IV   . Hyperlipemia   . Hypertension     SURGICAL HISTORY: Past Surgical History:  Procedure Laterality Date  . IR IMAGING GUIDED PORT INSERTION  07/20/2019  . JOINT REPLACEMENT      SOCIAL HISTORY: Social History   Socioeconomic History  . Marital status: Married    Spouse name: Not on file  . Number of children: Not on file  . Years of education: Not on file  . Highest education level: Not on file  Occupational History  . Not on file  Tobacco Use  . Smoking status: Current Every Day Smoker    Packs/day: 0.25    Types: Cigarettes  . Smokeless tobacco: Never Used  . Tobacco comment: 1 to 2 cigarettes a day occasionally  Vaping Use  . Vaping Use: Never used  Substance and Sexual Activity  . Alcohol use: No  . Drug use: No  . Sexual activity: Not on file  Other Topics Concern  . Not on file  Social History Narrative   Recruitment consultant retd; lives in [place]; smoking 3cig/day; [3/4 ppd x started at 7 years]; no alcohol. Son & daughter; wife dementia [waiting for placement].    Social Determinants of Health   Financial Resource Strain: Not on file  Food Insecurity: Not on file  Transportation Needs: Not on file  Physical Activity: Not on file  Stress: Not on file  Social Connections: Not on file  Intimate Partner Violence: Not on file    FAMILY HISTORY: Family History  Problem Relation Age of Onset  . Peptic Ulcer Disease Father     ALLERGIES:  is allergic to ace inhibitors.  MEDICATIONS:  Current Outpatient Medications  Medication Sig Dispense Refill  . amLODipine (NORVASC) 5 MG tablet Take 5 mg by mouth daily.    . fluticasone (FLONASE) 50 MCG/ACT nasal spray Place 1 spray into both nostrils daily as needed.     .  lidocaine-prilocaine (EMLA) cream Apply 1 application topically as needed. 30 g 0  . meloxicam (MOBIC) 15 MG tablet Take 1 tablet (15 mg total) by mouth daily. 90 tablet 0  . montelukast (SINGULAIR) 10 MG tablet TAKE 1 TABLET BY MOUTH EVERYDAY AT BEDTIME 90 tablet 1  . ondansetron (ZOFRAN-ODT) 4 MG disintegrating tablet Take 1 tablet (4 mg total) by mouth every 6 (six) hours as needed for nausea. 20 tablet 0  . pravastatin (PRAVACHOL) 40 MG tablet Take 40 mg by mouth daily.     . prochlorperazine (COMPAZINE) 10 MG tablet Take 1 tablet (10 mg total) by mouth every 6 (six) hours as needed for nausea or vomiting. 40 tablet 1  . tamsulosin (FLOMAX) 0.4 MG CAPS capsule Take 1 capsule (0.4 mg total) by mouth daily. 30 capsule 11   No current facility-administered medications for this visit.      Marland Kitchen  PHYSICAL EXAMINATION: ECOG PERFORMANCE STATUS: 0 - Asymptomatic  Vitals:   09/30/20 0842  BP: (!) 113/56  Pulse: 70  Temp: 98.8 F (37.1 C)  SpO2: 100%   Filed Weights   09/30/20 0842  Weight: 194 lb 12.8 oz (88.4 kg)    Physical Exam HENT:     Head: Normocephalic and atraumatic.     Mouth/Throat:     Pharynx: No oropharyngeal  exudate.  Eyes:     Pupils: Pupils are equal, round, and reactive to light.  Cardiovascular:     Rate and Rhythm: Normal rate and regular rhythm.  Pulmonary:     Effort: No respiratory distress.     Breath sounds: No wheezing.  Abdominal:     General: Bowel sounds are normal. There is no distension.     Palpations: Abdomen is soft. There is no mass.     Tenderness: There is no abdominal tenderness. There is no guarding or rebound.  Musculoskeletal:        General: No tenderness. Normal range of motion.     Cervical back: Normal range of motion and neck supple.  Skin:    General: Skin is warm.  Neurological:     Mental Status: He is alert and oriented to person, place, and time.  Psychiatric:        Mood and Affect: Affect normal.    LABORATORY DATA:   I have reviewed the data as listed Lab Results  Component Value Date   WBC 3.3 (L) 09/30/2020   HGB 12.1 (L) 09/30/2020   HCT 35.6 (L) 09/30/2020   MCV 102.0 (H) 09/30/2020   PLT 128 (L) 09/30/2020   Recent Labs    06/25/20 0841 07/08/20 0843 07/22/20 0836 08/05/20 0821 09/02/20 0834 09/16/20 0818 09/30/20 0823  NA 138 139 138   < > 139 142 136  K 3.6 3.6 3.6   < > 3.5 3.7 3.7  CL 108 108 107   < > 108 109 105  CO2 $Re'24 25 25   'swb$ < > $R'26 25 24  'au$ GLUCOSE 201* 209* 195*   < > 196* 176* 206*  BUN $Re'10 9 10   'TGq$ < > $R'18 13 10  'Xx$ CREATININE 0.80 0.70 0.89   < > 1.01 0.75 0.74  CALCIUM 8.0* 8.0* 8.1*   < > 8.4* 8.4* 8.1*  GFRNONAA >60 >60 >60   < > >60 >60 >60  GFRAA >60 >60 >60  --   --   --   --   PROT 6.5 6.5 6.7   < > 6.5 5.9* 5.9*  ALBUMIN 3.2* 3.1* 3.1*   < > 3.1* 2.9* 3.0*  AST 32 31 29   < > 31 29 32  ALT $Re'20 19 19   'JjQ$ < > $R'21 20 20  'Zf$ ALKPHOS 121 102 108   < > 100 118 90  BILITOT 0.8 0.7 0.8   < > 0.8 0.8 0.9   < > = values in this interval not displayed.    RADIOGRAPHIC STUDIES: I have personally reviewed the radiological images as listed and agreed with the findings in the report. CT CHEST ABDOMEN PELVIS W CONTRAST  Result Date: 09/25/2020 CLINICAL DATA:  Right-sided colon cancer with metastasis to liver. Evaluate treatment response. EXAM: CT CHEST, ABDOMEN, AND PELVIS WITH CONTRAST TECHNIQUE: Multidetector CT imaging of the chest, abdomen and pelvis was performed following the standard protocol during bolus administration of intravenous contrast. CONTRAST:  151mL OMNIPAQUE IOHEXOL 300 MG/ML  SOLN COMPARISON:  06/19/2020 abdominopelvic CT. PET 03/20/2020. Most recent diagnostic chest CT 10/10/2019. FINDINGS: CT CHEST FINDINGS Cardiovascular: Right Port-A-Cath tip superior caval/atrial junction. Aortic atherosclerosis. Tortuous thoracic aorta. Normal heart size, without pericardial effusion. Multivessel coronary artery atherosclerosis. No central pulmonary embolism, on this non-dedicated  study. Mediastinum/Nodes: No supraclavicular adenopathy. No mediastinal or hilar adenopathy. Lungs/Pleura: No pleural fluid. Presumed secretions within the endobronchial tree. Bilateral pulmonary nodules. A pleural-based left lower lobe pulmonary  nodule measures 1.0 cm on 112/4 versus 8 mm on 06/19/2020. An inferior right upper lobe subpleural pulmonary nodule of 3 mm on 85/4 is not included on 06/19/2020 but has enlarged from 1 mm on 03/20/2020 PET. Left upper lobe 4 mm nodule on 59/4 is either new or has enlarged from 2 mm on 03/20/2020. Musculoskeletal: No acute osseous abnormality. CT ABDOMEN PELVIS FINDINGS Hepatobiliary: Segment 4A dominant 6.2 cm hepatic cyst again identified. Subcapsular right hepatic lobe measures 8 mm on 69/2 versus 10 mm on the prior exam. Segment 2 metastasis measures 3.9 x 3.6 cm on 58/2 versus similar on the prior exam. No new liver lesion. Implant along the capsule of the anterior left hepatic lobe measures 5 mm on 5/2 and is similar to on the prior. Normal gallbladder. Borderline common duct dilatation at 8 mm, decreased from 10 mm previously. Pancreas: Normal, without mass or ductal dilatation. Spleen: Normal in size, without focal abnormality. Adrenals/Urinary Tract: Normal adrenal glands. Upper pole 3 mm left renal collecting system calculus. 4.2 cm lower pole left renal cyst.  Normal urinary bladder. Stomach/Bowel: Gastric antral underdistention. Right hemicolectomy. Normal small bowel. Vascular/Lymphatic: Advanced aortic and branch vessel atherosclerosis. No abdominopelvic adenopathy. Reproductive: Normal prostate. Other: No significant free fluid. The implant just caudal to the hemicolectomy anastomosis measures 1.8 x 1.6 cm on 88/2 and is similar to minimally decreased compared to 1.9 x 1.8 cm on the prior exam. No new peritoneal metastasis. Musculoskeletal: Degenerative partial fusion of the bilateral sacroiliac joints. IMPRESSION: CT CHEST IMPRESSION 1. Progression of  pulmonary metastasis since 06/19/2020. 2. Coronary artery atherosclerosis. Aortic Atherosclerosis (ICD10-I70.0). CT ABDOMEN AND PELVIS IMPRESSION 1. Similar to minimal improvement in hepatic metastasis and perihepatic implant. No new liver lesions. 2. Similar to minimal improvement in peritoneal implant just caudal to the right hemicolectomy sutures. 3. Left nephrolithiasis. Electronically Signed   By: Abigail Miyamoto M.D.   On: 09/25/2020 08:47    ASSESSMENT & PLAN:   Cancer of right colon ([place]) #Right-sided colon adenocarcinoma-with synchronous metastasis to liver/unresectable. CT scan - DEC 7th, 2021-mild progression of the pulmonary metastases [but again less than centimeter nodules]; minimal improvement to stable liver lesions; peritoneal implant next anastomosis.  Given limited treatment options moving forward-I think is reasonable to continue FOLFIRI plus Avastin    #Proceed with today- # 7 of  FOLFIRI plus Avastin chemotherapy.  Labs today reviewed;  acceptable for treatment today- MILD neutropenia-ANC 1.5; platelets 128.  # PN- G-1-2; sec to oxaliplatin.  STABLE.   # DISPOSITION:  #FOLFIRI+ Avastin today; pump off in 2 days. # follow up in 2 weeks-X- MD; labs- cbc/cmp/UA/ CEA; FOLFIRI + avastin;pump off in 2 days'; # follow up in 4 weeks- MD; labs- cbc/cmp/UA/ CEA; FOLFIRI + avastin;pump off in 2 days'; -Dr.B  All questions were answered. The patient knows to call the clinic with any problems, questions or concerns.    Cammie Sickle, MD 09/30/2020 9:09 AM

## 2020-10-01 LAB — CEA: CEA: 27.5 ng/mL — ABNORMAL HIGH (ref 0.0–4.7)

## 2020-10-02 ENCOUNTER — Other Ambulatory Visit: Payer: Self-pay

## 2020-10-02 ENCOUNTER — Inpatient Hospital Stay: Payer: Medicare HMO

## 2020-10-02 DIAGNOSIS — C182 Malignant neoplasm of ascending colon: Secondary | ICD-10-CM | POA: Diagnosis not present

## 2020-10-02 DIAGNOSIS — Z7189 Other specified counseling: Secondary | ICD-10-CM

## 2020-10-02 DIAGNOSIS — Z452 Encounter for adjustment and management of vascular access device: Secondary | ICD-10-CM | POA: Diagnosis not present

## 2020-10-02 DIAGNOSIS — Z5112 Encounter for antineoplastic immunotherapy: Secondary | ICD-10-CM | POA: Diagnosis not present

## 2020-10-02 DIAGNOSIS — C787 Secondary malignant neoplasm of liver and intrahepatic bile duct: Secondary | ICD-10-CM | POA: Diagnosis not present

## 2020-10-02 DIAGNOSIS — Z5111 Encounter for antineoplastic chemotherapy: Secondary | ICD-10-CM | POA: Diagnosis not present

## 2020-10-02 MED ORDER — HEPARIN SOD (PORK) LOCK FLUSH 100 UNIT/ML IV SOLN
500.0000 [IU] | Freq: Once | INTRAVENOUS | Status: AC | PRN
  Administered 2020-10-02: 500 [IU]
  Filled 2020-10-02: qty 5

## 2020-10-02 MED ORDER — SODIUM CHLORIDE 0.9% FLUSH
10.0000 mL | INTRAVENOUS | Status: DC | PRN
  Administered 2020-10-02: 10 mL
  Filled 2020-10-02: qty 10

## 2020-10-14 ENCOUNTER — Other Ambulatory Visit: Payer: Self-pay

## 2020-10-14 ENCOUNTER — Inpatient Hospital Stay: Payer: Medicare HMO

## 2020-10-14 ENCOUNTER — Inpatient Hospital Stay (HOSPITAL_BASED_OUTPATIENT_CLINIC_OR_DEPARTMENT_OTHER): Payer: Medicare HMO | Admitting: Oncology

## 2020-10-14 VITALS — BP 105/53 | HR 66 | Temp 97.9°F | Resp 20 | Ht 72.0 in | Wt 191.5 lb

## 2020-10-14 VITALS — BP 151/54 | HR 68 | Resp 18

## 2020-10-14 DIAGNOSIS — Z5112 Encounter for antineoplastic immunotherapy: Secondary | ICD-10-CM

## 2020-10-14 DIAGNOSIS — Z452 Encounter for adjustment and management of vascular access device: Secondary | ICD-10-CM | POA: Diagnosis not present

## 2020-10-14 DIAGNOSIS — D701 Agranulocytosis secondary to cancer chemotherapy: Secondary | ICD-10-CM | POA: Diagnosis not present

## 2020-10-14 DIAGNOSIS — T451X5A Adverse effect of antineoplastic and immunosuppressive drugs, initial encounter: Secondary | ICD-10-CM | POA: Diagnosis not present

## 2020-10-14 DIAGNOSIS — G620 Drug-induced polyneuropathy: Secondary | ICD-10-CM | POA: Diagnosis not present

## 2020-10-14 DIAGNOSIS — C182 Malignant neoplasm of ascending colon: Secondary | ICD-10-CM

## 2020-10-14 DIAGNOSIS — Z5111 Encounter for antineoplastic chemotherapy: Secondary | ICD-10-CM | POA: Diagnosis not present

## 2020-10-14 DIAGNOSIS — Z7189 Other specified counseling: Secondary | ICD-10-CM

## 2020-10-14 DIAGNOSIS — C787 Secondary malignant neoplasm of liver and intrahepatic bile duct: Secondary | ICD-10-CM | POA: Diagnosis not present

## 2020-10-14 LAB — COMPREHENSIVE METABOLIC PANEL
ALT: 20 U/L (ref 0–44)
AST: 29 U/L (ref 15–41)
Albumin: 3 g/dL — ABNORMAL LOW (ref 3.5–5.0)
Alkaline Phosphatase: 94 U/L (ref 38–126)
Anion gap: 7 (ref 5–15)
BUN: 13 mg/dL (ref 8–23)
CO2: 25 mmol/L (ref 22–32)
Calcium: 8.3 mg/dL — ABNORMAL LOW (ref 8.9–10.3)
Chloride: 106 mmol/L (ref 98–111)
Creatinine, Ser: 0.81 mg/dL (ref 0.61–1.24)
GFR, Estimated: >60 mL/min (ref >60)
Glucose, Bld: 209 mg/dL — ABNORMAL HIGH (ref 70–99)
Potassium: 3.8 mmol/L (ref 3.5–5.1)
Sodium: 138 mmol/L (ref 135–145)
Total Bilirubin: 0.8 mg/dL (ref 0.3–1.2)
Total Protein: 6.1 g/dL — ABNORMAL LOW (ref 6.5–8.1)

## 2020-10-14 LAB — URINALYSIS, COMPLETE (UACMP) WITH MICROSCOPIC
Bacteria, UA: NONE SEEN
Bilirubin Urine: NEGATIVE
Glucose, UA: 150 mg/dL — AB
Hgb urine dipstick: NEGATIVE
Ketones, ur: NEGATIVE mg/dL
Leukocytes,Ua: NEGATIVE
Nitrite: NEGATIVE
Protein, ur: NEGATIVE mg/dL
Specific Gravity, Urine: 1.014 (ref 1.005–1.030)
Squamous Epithelial / HPF: NONE SEEN (ref 0–5)
pH: 7 (ref 5.0–8.0)

## 2020-10-14 LAB — CBC WITH DIFFERENTIAL/PLATELET
Abs Immature Granulocytes: 0.01 10*3/uL (ref 0.00–0.07)
Basophils Absolute: 0 10*3/uL (ref 0.0–0.1)
Basophils Relative: 1 %
Eosinophils Absolute: 0.2 10*3/uL (ref 0.0–0.5)
Eosinophils Relative: 8 %
HCT: 34.4 % — ABNORMAL LOW (ref 39.0–52.0)
Hemoglobin: 11.9 g/dL — ABNORMAL LOW (ref 13.0–17.0)
Immature Granulocytes: 0 %
Lymphocytes Relative: 36 %
Lymphs Abs: 1.1 10*3/uL (ref 0.7–4.0)
MCH: 34.7 pg — ABNORMAL HIGH (ref 26.0–34.0)
MCHC: 34.6 g/dL (ref 30.0–36.0)
MCV: 100.3 fL — ABNORMAL HIGH (ref 80.0–100.0)
Monocytes Absolute: 0.4 10*3/uL (ref 0.1–1.0)
Monocytes Relative: 13 %
Neutro Abs: 1.3 10*3/uL — ABNORMAL LOW (ref 1.7–7.7)
Neutrophils Relative %: 42 %
Platelets: 112 10*3/uL — ABNORMAL LOW (ref 150–400)
RBC: 3.43 MIL/uL — ABNORMAL LOW (ref 4.22–5.81)
RDW: 15.5 % (ref 11.5–15.5)
WBC: 3 10*3/uL — ABNORMAL LOW (ref 4.0–10.5)
nRBC: 0 % (ref 0.0–0.2)

## 2020-10-14 MED ORDER — PALONOSETRON HCL INJECTION 0.25 MG/5ML
0.2500 mg | Freq: Once | INTRAVENOUS | Status: AC
  Administered 2020-10-14: 09:00:00 0.25 mg via INTRAVENOUS
  Filled 2020-10-14: qty 5

## 2020-10-14 MED ORDER — SODIUM CHLORIDE 0.9 % IV SOLN
Freq: Once | INTRAVENOUS | Status: AC
  Filled 2020-10-14: qty 250

## 2020-10-14 MED ORDER — SODIUM CHLORIDE 0.9% FLUSH
10.0000 mL | INTRAVENOUS | Status: DC | PRN
  Administered 2020-10-14: 08:00:00 10 mL via INTRAVENOUS
  Filled 2020-10-14: qty 10

## 2020-10-14 MED ORDER — SODIUM CHLORIDE 0.9 % IV SOLN
5000.0000 mg | INTRAVENOUS | Status: DC
  Administered 2020-10-14: 12:00:00 5000 mg via INTRAVENOUS
  Filled 2020-10-14: qty 100

## 2020-10-14 MED ORDER — SODIUM CHLORIDE 0.9 % IV SOLN
180.0000 mg/m2 | Freq: Once | INTRAVENOUS | Status: AC
  Administered 2020-10-14: 10:00:00 380 mg via INTRAVENOUS
  Filled 2020-10-14: qty 15

## 2020-10-14 MED ORDER — SODIUM CHLORIDE 0.9 % IV SOLN
10.0000 mg | Freq: Once | INTRAVENOUS | Status: AC
  Administered 2020-10-14: 09:00:00 10 mg via INTRAVENOUS
  Filled 2020-10-14: qty 10

## 2020-10-14 MED ORDER — SODIUM CHLORIDE 0.9 % IV SOLN
5.0000 mg/kg | Freq: Once | INTRAVENOUS | Status: AC
  Administered 2020-10-14: 10:00:00 400 mg via INTRAVENOUS
  Filled 2020-10-14: qty 16

## 2020-10-14 MED ORDER — DIPHENOXYLATE-ATROPINE 2.5-0.025 MG PO TABS
1.0000 | ORAL_TABLET | Freq: Once | ORAL | Status: AC
  Administered 2020-10-14: 09:00:00 1 via ORAL
  Filled 2020-10-14: qty 1

## 2020-10-14 NOTE — Progress Notes (Signed)
Patient c/o mild constipation. Patient encouraged to increase po fluid intake and educated to use otc stool softeners as needed.

## 2020-10-14 NOTE — Progress Notes (Signed)
Patient tolerated infusion well. Discharged home.  

## 2020-10-14 NOTE — Progress Notes (Signed)
Hematology/Oncology Consult note [facility]  [phone] [phone]  Patient Care Team: Sofie Hartigan, MD as PCP - General (Family Medicine) [name], MD as Consulting Physician (Hematology and Oncology)   Name of the patient: Oscar [name]  [id number]  [birth year]   Date of visit: 10/14/20  Diagnosis-metastatic colon cancer  Chief complaint/ Reason for visit-on treatment assessment prior to cycle 8 of FOLFIRI Avastin chemotherapy  Heme/Onc history:  Oncology History Overview Note  # MAY 2020- 3. 03/09/19 Liver biopsy. Microscopic examination shows malignant cells with glandular architecture consistent with adenocarcinoma. The malignant cells are positive for CK20 and CDX-2. These findings support the clinical impression of metastatic colon adenocarcinoma. 4. 03/10/19 R hemicolectomy. Tumor site cecum. Adenocarcinoma. Mucinous features present. G2. No tumor deposits. Invades visceral peritoneum. No tumor perforation. LVI present. PNI not identified. All margins uninvolved. 1/12 LNs. PT4apN1. Periappendiceal inflammation c/w resolving abscess. Microsatellite stable (MSS). [Dr.Mettu; [facility]]  # SEP 4th 2020 [compared to May 2020]  Interval increase in size of the metastases to the hepatic dome, The metastasis to the left hepatic lobe is unchanged; 2.  New subcentimeter hypoattenuating lesion in the inferior right hepatic lobe, incompletely characterized on CT. 3.  Postsurgical changes following right hemicolectomy.  # OCT 2020- FOLFOX +avastin; CT dec 22nd 2020- [compared to [facility] sep 9th 2020]-Liver- slight progression versus stable disease; CT scan SEP 4th 2021- Progressive disease-left lower lobe lung nodule 8 mm [previously 4 mm]; increase in size of the hepatic metastases by few millimeters.;  Soft tissue nodule adjacent to anastomotic site again increased by few millimeters.STOP FOLFOX; cont avastin  #  SEP 20th, 2021- FOLFIRI+  AVASTIN  # NGS/F-ONE-MUTATED K-RAS [G]  # PALLIATIVE CARE EVALUATION: 09/20/2019-Oscar [name]  # PAIN MANAGEMENT: NA   DIAGNOSIS: COLON CANCER  STAGE:  IV     ;  GOALS:Palliative  CURRENT/MOST RECENT THERAPY : FOLFIRI+ avastin [C]    Cancer of right colon ([place])  07/05/2019 Initial Diagnosis   Cancer of right colon ([place])   07/24/2019 - 06/25/2020 Chemotherapy   The patient had dexamethasone (DECADRON) 4 MG tablet, 8 mg, Oral, Daily, 1 of 1 cycle, Start date: --, End date: -- palonosetron (ALOXI) injection 0.25 mg, 0.25 mg, Intravenous,  Once, 23 of 25 cycles Administration: 0.25 mg (07/24/2019), 0.25 mg (08/07/2019), 0.25 mg (08/21/2019), 0.25 mg (09/04/2019), 0.25 mg (09/20/2019), 0.25 mg (10/04/2019), 0.25 mg (10/16/2019), 0.25 mg (10/30/2019), 0.25 mg (11/22/2019), 0.25 mg (12/06/2019), 0.25 mg (12/20/2019), 0.25 mg (01/03/2020), 0.25 mg (01/29/2020), 0.25 mg (02/12/2020), 0.25 mg (02/26/2020), 0.25 mg (03/11/2020), 0.25 mg (03/25/2020), 0.25 mg (04/08/2020), 0.25 mg (04/24/2020), 0.25 mg (05/08/2020), 0.25 mg (05/22/2020), 0.25 mg (06/10/2020), 0.25 mg (06/25/2020) leucovorin 800 mg in dextrose 5 % 250 mL infusion, 844 mg, Intravenous,  Once, 23 of 25 cycles Administration: 800 mg (07/24/2019), 800 mg (08/07/2019), 800 mg (08/21/2019), 800 mg (09/04/2019), 800 mg (09/20/2019), 800 mg (10/04/2019), 800 mg (10/16/2019), 800 mg (10/30/2019), 800 mg (11/22/2019), 800 mg (12/06/2019), 800 mg (12/20/2019), 800 mg (01/03/2020), 800 mg (01/29/2020), 800 mg (02/12/2020), 800 mg (02/26/2020), 800 mg (03/11/2020), 800 mg (03/25/2020), 800 mg (04/08/2020), 800 mg (04/24/2020), 800 mg (05/08/2020), 800 mg (05/22/2020), 800 mg (06/10/2020), 800 mg (06/25/2020) oxaliplatin (ELOXATIN) 180 mg in dextrose 5 % 500 mL chemo infusion, 85 mg/m2 = 180 mg, Intravenous,  Once, 23 of 25 cycles Dose modification: 178 mg (original dose 85 mg/m2, Cycle 17, Reason: Other (see comments), Comment: insurance adjusted dose ) Administration: 180 mg (07/24/2019), 180 mg (  08/07/2019),  180 mg (08/21/2019), 180 mg (09/04/2019), 180 mg (09/20/2019), 180 mg (10/04/2019), 180 mg (10/16/2019), 180 mg (10/30/2019), 180 mg (11/22/2019), 180 mg (12/06/2019), 180 mg (12/20/2019), 180 mg (01/03/2020), 180 mg (01/29/2020), 180 mg (02/12/2020), 180 mg (02/26/2020), 180 mg (03/11/2020), 180 mg (04/08/2020), 180 mg (04/24/2020), 180 mg (05/08/2020), 180 mg (05/22/2020), 180 mg (06/10/2020), 180 mg (06/25/2020) fluorouracil (ADRUCIL) 5,000 mg in sodium chloride 0.9 % 150 mL chemo infusion, 5,050 mg, Intravenous, 1 Day/Dose, 23 of 25 cycles Administration: 5,000 mg (07/24/2019), 5,000 mg (08/07/2019), 5,000 mg (08/21/2019), 5,050 mg (09/04/2019), 5,000 mg (10/04/2019), 5,000 mg (10/16/2019), 5,000 mg (11/22/2019), 5,000 mg (12/06/2019), 5,000 mg (12/20/2019), 5,000 mg (01/03/2020), 5,000 mg (01/29/2020), 5,000 mg (02/12/2020), 5,000 mg (02/26/2020), 5,000 mg (03/11/2020), 5,000 mg (03/25/2020), 5,000 mg (04/08/2020), 5,000 mg (04/24/2020), 5,000 mg (05/08/2020), 5,000 mg (05/22/2020), 5,000 mg (06/10/2020), 5,000 mg (06/25/2020) bevacizumab-bvzr (ZIRABEV) 400 mg in sodium chloride 0.9 % 100 mL chemo infusion, 5 mg/kg = 400 mg, Intravenous,  Once, 23 of 25 cycles Administration: 400 mg (08/07/2019), 400 mg (08/21/2019), 400 mg (09/04/2019), 400 mg (09/20/2019), 400 mg (10/04/2019), 400 mg (10/16/2019), 400 mg (10/30/2019), 400 mg (11/22/2019), 400 mg (12/06/2019), 400 mg (12/20/2019), 400 mg (01/03/2020), 400 mg (01/29/2020), 400 mg (02/12/2020), 400 mg (02/26/2020), 400 mg (03/11/2020), 400 mg (03/25/2020), 400 mg (04/08/2020), 400 mg (04/24/2020), 400 mg (05/08/2020), 400 mg (05/22/2020), 400 mg (06/10/2020), 400 mg (06/25/2020)  for chemotherapy treatment.    07/08/2020 -  Chemotherapy   The patient had dexamethasone (DECADRON) 4 MG tablet, 8 mg, Oral, Daily, 1 of 1 cycle, Start date: --, End date: -- palonosetron (ALOXI) injection 0.25 mg, 0.25 mg, Intravenous,  Once, 7 of 11 cycles Administration: 0.25 mg (07/08/2020), 0.25 mg (07/22/2020), 0.25 mg (08/05/2020), 0.25 mg  (08/19/2020), 0.25 mg (09/02/2020), 0.25 mg (09/16/2020), 0.25 mg (09/30/2020) irinotecan (CAMPTOSAR) 380 mg in sodium chloride 0.9 % 500 mL chemo infusion, 180 mg/m2 = 380 mg, Intravenous,  Once, 7 of 11 cycles Administration: 380 mg (07/08/2020), 380 mg (07/22/2020), 380 mg (08/05/2020), 380 mg (08/19/2020), 380 mg (09/02/2020), 380 mg (09/16/2020), 380 mg (09/30/2020) fluorouracil (ADRUCIL) 5,000 mg in sodium chloride 0.9 % 150 mL chemo infusion, 2,369 mg/m2 = 5,050 mg, Intravenous, 1 Day/Dose, 7 of 11 cycles Administration: 5,000 mg (07/08/2020), 5,000 mg (07/22/2020), 5,000 mg (08/05/2020), 5,000 mg (08/19/2020), 5,000 mg (09/02/2020), 5,000 mg (09/16/2020), 5,000 mg (09/30/2020) bevacizumab-bvzr (ZIRABEV) 450 mg in sodium chloride 0.9 % 100 mL chemo infusion, 400 mg, Intravenous,  Once, 7 of 11 cycles Administration: 450 mg (07/08/2020), 450 mg (07/22/2020), 400 mg (08/05/2020), 400 mg (08/19/2020), 400 mg (09/02/2020), 400 mg (09/16/2020), 400 mg (09/30/2020)  for chemotherapy treatment.       Interval history-he is tolerating chemotherapy well without any significant nausea vomiting or diarrhea.  In fact for the most part he has constipation for which he is taking bowel medications.  Denies other complaints at this time  ECOG PS- 1 Pain scale- 0 Opioid associated constipation- no  Review of systems- Review of Systems  Constitutional: Positive for malaise/fatigue. Negative for chills, fever and weight loss.  HENT: Negative for congestion, ear discharge and nosebleeds.   Eyes: Negative for blurred vision.  Respiratory: Negative for cough, hemoptysis, sputum production, shortness of breath and wheezing.   Cardiovascular: Negative for chest pain, palpitations, orthopnea and claudication.  Gastrointestinal: Positive for constipation. Negative for abdominal pain, blood in stool, diarrhea, heartburn, melena, nausea and vomiting.  Genitourinary: Negative for dysuria, flank pain, frequency, hematuria  and urgency.  Musculoskeletal: Negative  for back pain, joint pain and myalgias.  Skin: Negative for rash.  Neurological: Negative for dizziness, tingling, focal weakness, seizures, weakness and headaches.  Endo/Heme/Allergies: Does not bruise/bleed easily.  Psychiatric/Behavioral: Negative for depression and suicidal ideas. The patient does not have insomnia.       Allergies  Allergen Reactions  . Ace Inhibitors Swelling     Past Medical History:  Diagnosis Date  . Arthritis   . Cancer ([place])    colon cancer 02/2019 per pt   . Diabetes mellitus without complication ([place])   . H/O colon cancer, stage IV   . Hyperlipemia   . Hypertension      Past Surgical History:  Procedure Laterality Date  . IR IMAGING GUIDED PORT INSERTION  07/20/2019  . JOINT REPLACEMENT      Social History   Socioeconomic History  . Marital status: Married    Spouse name: Not on file  . Number of children: Not on file  . Years of education: Not on file  . Highest education level: Not on file  Occupational History  . Not on file  Tobacco Use  . Smoking status: Current Every Day Smoker    Packs/day: 0.25    Types: Cigarettes  . Smokeless tobacco: Never Used  . Tobacco comment: 1 to 2 cigarettes a day occasionally  Vaping Use  . Vaping Use: Never used  Substance and Sexual Activity  . Alcohol use: No  . Drug use: No  . Sexual activity: Not on file  Other Topics Concern  . Not on file  Social History Narrative   Recruitment consultant retd; lives in [place]; smoking 3cig/day; [3/4 ppd x started at 9 years]; no alcohol. Son & daughter; wife dementia [waiting for placement].    Social Determinants of Health   Financial Resource Strain: Not on file  Food Insecurity: Not on file  Transportation Needs: Not on file  Physical Activity: Not on file  Stress: Not on file  Social Connections: Not on file  Intimate Partner Violence: Not on file    Family History  Problem Relation Age of  Onset  . Peptic Ulcer Disease Father      Current Outpatient Medications:  .  amLODipine (NORVASC) 5 MG tablet, Take 5 mg by mouth daily., Disp: , Rfl:  .  fluticasone (FLONASE) 50 MCG/ACT nasal spray, Place 1 spray into both nostrils daily as needed. , Disp: , Rfl:  .  lidocaine-prilocaine (EMLA) cream, Apply 1 application topically as needed., Disp: 30 g, Rfl: 0 .  meloxicam (MOBIC) 15 MG tablet, Take 1 tablet (15 mg total) by mouth daily., Disp: 90 tablet, Rfl: 0 .  montelukast (SINGULAIR) 10 MG tablet, TAKE 1 TABLET BY MOUTH EVERYDAY AT BEDTIME, Disp: 90 tablet, Rfl: 1 .  ondansetron (ZOFRAN-ODT) 4 MG disintegrating tablet, Take 1 tablet (4 mg total) by mouth every 6 (six) hours as needed for nausea., Disp: 20 tablet, Rfl: 0 .  pravastatin (PRAVACHOL) 40 MG tablet, Take 40 mg by mouth daily. , Disp: , Rfl:  .  prochlorperazine (COMPAZINE) 10 MG tablet, Take 1 tablet (10 mg total) by mouth every 6 (six) hours as needed for nausea or vomiting., Disp: 40 tablet, Rfl: 1 .  tamsulosin (FLOMAX) 0.4 MG CAPS capsule, Take 1 capsule (0.4 mg total) by mouth daily., Disp: 30 capsule, Rfl: 11  Physical exam:  Vitals:   10/14/20 0830  BP: (!) 105/53  Pulse: 66  Resp: 20  Temp: 97.9 F (36.6 C)  TempSrc: Oral  Weight: 191 lb 8 oz (86.9 kg)  Height: 6' (1.829 m)   Physical Exam HENT:     Head: Normocephalic and atraumatic.  Eyes:     Extraocular Movements: EOM normal.     Pupils: Pupils are equal, round, and reactive to light.  Cardiovascular:     Rate and Rhythm: Normal rate and regular rhythm.     Heart sounds: Normal heart sounds.  Pulmonary:     Effort: Pulmonary effort is normal.     Breath sounds: Normal breath sounds.  Abdominal:     General: Bowel sounds are normal.     Palpations: Abdomen is soft.  Musculoskeletal:     Cervical back: Normal range of motion.  Skin:    General: Skin is warm and dry.  Neurological:     Mental Status: He is alert and oriented to person,  place, and time.      CMP Latest Ref Rng & Units 09/30/2020  Glucose 70 - 99 mg/dL 206(H)  BUN 8 - 23 mg/dL 10  Creatinine 0.61 - 1.24 mg/dL 0.74  Sodium 135 - 145 mmol/L 136  Potassium 3.5 - 5.1 mmol/L 3.7  Chloride 98 - 111 mmol/L 105  CO2 22 - 32 mmol/L 24  Calcium 8.9 - 10.3 mg/dL 8.1(L)  Total Protein 6.5 - 8.1 g/dL 5.9(L)  Total Bilirubin 0.3 - 1.2 mg/dL 0.9  Alkaline Phos 38 - 126 U/L 90  AST 15 - 41 U/L 32  ALT 0 - 44 U/L 20   CBC Latest Ref Rng & Units 09/30/2020  WBC 4.0 - 10.5 K/uL 3.3(L)  Hemoglobin 13.0 - 17.0 g/dL 12.1(L)  Hematocrit 39.0 - 52.0 % 35.6(L)  Platelets 150 - 400 K/uL 128(L)    No images are attached to the encounter.  CT CHEST ABDOMEN PELVIS W CONTRAST  Result Date: 09/25/2020 CLINICAL DATA:  Right-sided colon cancer with metastasis to liver. Evaluate treatment response. EXAM: CT CHEST, ABDOMEN, AND PELVIS WITH CONTRAST TECHNIQUE: Multidetector CT imaging of the chest, abdomen and pelvis was performed following the standard protocol during bolus administration of intravenous contrast. CONTRAST:  130m OMNIPAQUE IOHEXOL 300 MG/ML  SOLN COMPARISON:  06/19/2020 abdominopelvic CT. PET 03/20/2020. Most recent diagnostic chest CT 10/10/2019. FINDINGS: CT CHEST FINDINGS Cardiovascular: Right Port-A-Cath tip superior caval/atrial junction. Aortic atherosclerosis. Tortuous thoracic aorta. Normal heart size, without pericardial effusion. Multivessel coronary artery atherosclerosis. No central pulmonary embolism, on this non-dedicated study. Mediastinum/Nodes: No supraclavicular adenopathy. No mediastinal or hilar adenopathy. Lungs/Pleura: No pleural fluid. Presumed secretions within the endobronchial tree. Bilateral pulmonary nodules. A pleural-based left lower lobe pulmonary nodule measures 1.0 cm on 112/4 versus 8 mm on 06/19/2020. An inferior right upper lobe subpleural pulmonary nodule of 3 mm on 85/4 is not included on 06/19/2020 but has enlarged from 1 mm on  03/20/2020 PET. Left upper lobe 4 mm nodule on 59/4 is either new or has enlarged from 2 mm on 03/20/2020. Musculoskeletal: No acute osseous abnormality. CT ABDOMEN PELVIS FINDINGS Hepatobiliary: Segment 4A dominant 6.2 cm hepatic cyst again identified. Subcapsular right hepatic lobe measures 8 mm on 69/2 versus 10 mm on the prior exam. Segment 2 metastasis measures 3.9 x 3.6 cm on 58/2 versus similar on the prior exam. No new liver lesion. Implant along the capsule of the anterior left hepatic lobe measures 5 mm on 5/2 and is similar to on the prior. Normal gallbladder. Borderline common duct dilatation at 8 mm, decreased from 10 mm previously. Pancreas: Normal, without mass or ductal dilatation. Spleen:  Normal in size, without focal abnormality. Adrenals/Urinary Tract: Normal adrenal glands. Upper pole 3 mm left renal collecting system calculus. 4.2 cm lower pole left renal cyst.  Normal urinary bladder. Stomach/Bowel: Gastric antral underdistention. Right hemicolectomy. Normal small bowel. Vascular/Lymphatic: Advanced aortic and branch vessel atherosclerosis. No abdominopelvic adenopathy. Reproductive: Normal prostate. Other: No significant free fluid. The implant just caudal to the hemicolectomy anastomosis measures 1.8 x 1.6 cm on 88/2 and is similar to minimally decreased compared to 1.9 x 1.8 cm on the prior exam. No new peritoneal metastasis. Musculoskeletal: Degenerative partial fusion of the bilateral sacroiliac joints. IMPRESSION: CT CHEST IMPRESSION 1. Progression of pulmonary metastasis since 06/19/2020. 2. Coronary artery atherosclerosis. Aortic Atherosclerosis (ICD10-I70.0). CT ABDOMEN AND PELVIS IMPRESSION 1. Similar to minimal improvement in hepatic metastasis and perihepatic implant. No new liver lesions. 2. Similar to minimal improvement in peritoneal implant just caudal to the right hemicolectomy sutures. 3. Left nephrolithiasis. Electronically Signed   By: Abigail Miyamoto M.D.   On: 09/25/2020  08:47     Assessment and plan- Patient is a 79 y.o. male with metastatic colon cancer and synchronous liver and pulmonary metastases here for on treatment assessment prior to cycle 8 of FOLFIRI Avastin chemotherapy  Counts okay to proceed with cycle 8 of FOLFIRI Avastin chemotherapy today with pump disconnect on day 3.Patient will see Dr. Rogue Bussing in 2 weeks on 10/28/2020 for cycle 9.  Urine protein is currently pending and blood pressure is acceptable to proceed with Avastin/Zirabev  Chemo-induced peripheral neuropathy: Oxaliplatin has been discontinued and neuropathy is currently stable at grade 1.  He is not on any medications.  Continue to monitor  Chemo-induced neutropenia: Chronic and currently stable with an ANC between 1.3-1.5.  Continue to monitor   Visit Diagnosis 1. Encounter for monoclonal antibody treatment for malignancy   2. Encounter for antineoplastic chemotherapy   3. Cancer of right colon ([place])   4. Chemotherapy-induced peripheral neuropathy ([place])   5. Chemotherapy induced neutropenia ([facility])      Dr. Randa Evens, MD, MPH [facility] at [facility] [phone] 10/14/2020 10:23 AM

## 2020-10-15 LAB — CEA: CEA: 27.4 ng/mL — ABNORMAL HIGH (ref 0.0–4.7)

## 2020-10-16 ENCOUNTER — Inpatient Hospital Stay: Payer: Medicare HMO

## 2020-10-16 VITALS — BP 129/71 | HR 62 | Temp 98.7°F | Resp 18

## 2020-10-16 DIAGNOSIS — C182 Malignant neoplasm of ascending colon: Secondary | ICD-10-CM

## 2020-10-16 DIAGNOSIS — Z452 Encounter for adjustment and management of vascular access device: Secondary | ICD-10-CM | POA: Diagnosis not present

## 2020-10-16 DIAGNOSIS — Z7189 Other specified counseling: Secondary | ICD-10-CM

## 2020-10-16 DIAGNOSIS — C787 Secondary malignant neoplasm of liver and intrahepatic bile duct: Secondary | ICD-10-CM | POA: Diagnosis not present

## 2020-10-16 DIAGNOSIS — Z5112 Encounter for antineoplastic immunotherapy: Secondary | ICD-10-CM | POA: Diagnosis not present

## 2020-10-16 DIAGNOSIS — Z5111 Encounter for antineoplastic chemotherapy: Secondary | ICD-10-CM | POA: Diagnosis not present

## 2020-10-16 MED ORDER — HEPARIN SOD (PORK) LOCK FLUSH 100 UNIT/ML IV SOLN
INTRAVENOUS | Status: AC
  Filled 2020-10-16: qty 5

## 2020-10-16 MED ORDER — HEPARIN SOD (PORK) LOCK FLUSH 100 UNIT/ML IV SOLN
500.0000 [IU] | Freq: Once | INTRAVENOUS | Status: AC | PRN
  Administered 2020-10-16: 13:00:00 500 [IU]
  Filled 2020-10-16: qty 5

## 2020-10-16 MED ORDER — SODIUM CHLORIDE 0.9% FLUSH
10.0000 mL | INTRAVENOUS | Status: DC | PRN
  Administered 2020-10-16: 13:00:00 10 mL
  Filled 2020-10-16: qty 10

## 2020-10-16 NOTE — Progress Notes (Signed)
Patient chemo pump d/c'd &  no concerns voiced. Patient discharged. Vitals stable.

## 2020-10-21 ENCOUNTER — Telehealth: Payer: Self-pay | Admitting: *Deleted

## 2020-10-21 NOTE — Telephone Encounter (Signed)
Spoke to patient. Sx onset 10/20/20. Positive home test. Patient vaccinated x 2 but hasn't had booster. Advised that due to taking chemotherapy for colon cancer, bmi, age, diabetes, hypertension, smoking history he is at high risk of complications and I recommend mab and/or anti-viral treatment if it's available to him. He agrees. Referral sent to covid 19 infusion clinic for screening. Supportive care and home monitoring guidelines reviewed and ER precautions provided.

## 2020-10-21 NOTE — Telephone Encounter (Signed)
Call from [facility] that patient wife is positive for COVID and now patient has temp of 102, Headache and back pain. Asking what he needs to do. Please advise

## 2020-10-21 NOTE — Telephone Encounter (Signed)
Oscar [name] has already spoke to patient. ( see phone note). Also, I spoke with daughter (documented on phone note).

## 2020-10-21 NOTE — Telephone Encounter (Signed)
Colette - Please reschedule patient's chemotherapy on 1/10 to the following week of Jan. 17'th due to covid.

## 2020-10-21 NOTE — Telephone Encounter (Signed)
1445-Spoke with step-daughter, Oscar [name]. She is also positive for covid. She tested positive. Her mom/patient's wife Oscar [name]) also is positive. MaryAnn requested that the infusion clinic call her personally to set up the infusions for patient.  Oscar [name] said she had numerous home test available that she would use to test her father. She was also given the [facility] testing site number as well. She thanked me for following up.

## 2020-10-21 NOTE — Telephone Encounter (Signed)
Patient did an at home COVID test because first available appointment for PCR test was not until Wednesday and it has come back positive. Asking what needs to be done now. Please advise

## 2020-10-21 NOTE — Telephone Encounter (Signed)
Can you look into this?

## 2020-10-22 ENCOUNTER — Other Ambulatory Visit: Payer: Self-pay | Admitting: Nurse Practitioner

## 2020-10-22 ENCOUNTER — Telehealth (HOSPITAL_COMMUNITY): Payer: Self-pay

## 2020-10-22 ENCOUNTER — Encounter: Payer: Self-pay | Admitting: Nurse Practitioner

## 2020-10-22 DIAGNOSIS — U071 COVID-19: Secondary | ICD-10-CM

## 2020-10-22 MED ORDER — NIRMATRELVIR/RITONAVIR (PAXLOVID)TABLET: 3.0000 | ORAL_TABLET | Freq: Two times a day (BID) | ORAL | 0 refills | Status: AC

## 2020-10-22 MED FILL — PAXLOVID 20 X 150 MG & 10 X: 20 X 150 MG | 5 days supply | Qty: 30 | Fill #0

## 2020-10-22 NOTE — Progress Notes (Signed)
Outpatient Oral & IV COVID Treatment Note  I connected with Oscar [name] on 10/22/2020/12:54 PM by telephone and verified that I am speaking with the correct person using two identifiers.  I discussed the limitations, risks, security, and privacy concerns of performing an evaluation and management service by telephone and the availability of in person appointments. I also discussed with the patient that there may be a patient responsible charge related to this service. The patient expressed understanding and agreed to proceed.  Patient location: home Provider location: clinic  Diagnosis: COVID-19 infection  Purpose of visit: Discussion of potential use of Molnupiravir or Paxlovid and a monoclonal antibody (mAB) for the treatment of mild to moderate COVID-19 viral infection in non-hospitalized patients.    Subjective: Patient is a 80 y.o. male who has been diagnosed with COVID 19 viral infection.  Patient's symptoms began on 10/20/20 with rhinorrhea  Vaccination status: Vaccinated x 2 > 6 months ago. Has not received booster.  Medical comorbidities reviewed as below.   Past Medical History:  Diagnosis Date  . Arthritis   . Cancer ([place])    colon cancer 02/2019 per pt   . Diabetes mellitus without complication ([place])   . H/O colon cancer, stage IV   . Hyperlipemia   . Hypertension     Allergies  Allergen Reactions  . Ace Inhibitors Swelling    (Not in a hospital admission)   Current Outpatient Medications  Medication Sig Dispense Refill Last Dose  . amLODipine (NORVASC) 5 MG tablet Take 5 mg by mouth daily.     . fluticasone (FLONASE) 50 MCG/ACT nasal spray Place 1 spray into both nostrils daily as needed.      . lidocaine-prilocaine (EMLA) cream Apply 1 application topically as needed. 30 g 0   . meloxicam (MOBIC) 15 MG tablet Take 1 tablet (15 mg total) by mouth daily. 90 tablet 0   . montelukast (SINGULAIR) 10 MG tablet TAKE 1 TABLET BY MOUTH EVERYDAY AT BEDTIME (Patient not  taking: Reported on 10/14/2020) 90 tablet 1   . ondansetron (ZOFRAN-ODT) 4 MG disintegrating tablet Take 1 tablet (4 mg total) by mouth every 6 (six) hours as needed for nausea. (Patient not taking: Reported on 10/14/2020) 20 tablet 0   . pravastatin (PRAVACHOL) 40 MG tablet Take 40 mg by mouth daily.      . prochlorperazine (COMPAZINE) 10 MG tablet Take 1 tablet (10 mg total) by mouth every 6 (six) hours as needed for nausea or vomiting. (Patient not taking: Reported on 10/14/2020) 40 tablet 1   . tamsulosin (FLOMAX) 0.4 MG CAPS capsule Take 1 capsule (0.4 mg total) by mouth daily. 30 capsule 11    No current facility-administered medications for this visit.     Objective: Patient is congested sounding. Speaking in full sentences. Breathing is non labored. Mood and behavior are normal.   Laboratory Data:  CMP Latest Ref Rng & Units 10/14/2020 09/30/2020 09/16/2020  Glucose 70 - 99 mg/dL 209(H) 206(H) 176(H)  BUN 8 - 23 mg/dL 13 10 13   Creatinine 0.61 - 1.24 mg/dL 0.81 0.74 0.75  Sodium 135 - 145 mmol/L 138 136 142  Potassium 3.5 - 5.1 mmol/L 3.8 3.7 3.7  Chloride 98 - 111 mmol/L 106 105 109  CO2 22 - 32 mmol/L 25 24 25   Calcium 8.9 - 10.3 mg/dL 8.3(L) 8.1(L) 8.4(L)  Total Protein 6.5 - 8.1 g/dL 6.1(L) 5.9(L) 5.9(L)  Total Bilirubin 0.3 - 1.2 mg/dL 0.8 0.9 0.8  Alkaline Phos 38 -  126 U/L 94 90 118  AST 15 - 41 U/L 29 32 29  ALT 0 - 44 U/L 20 20 20      Assessment: 80 y.o. male with mild/moderate COVID 19 viral infection diagnosed on 10/21/20, symptom onset 10/20/20. He is at high risk for progression to severe COVID 19. Patient meets the FDA criteria for Emergency Use Authorization of Covid monoclonal antibody, Sotrovimab and criteria for use of Paxlovid 1. Age >12 yr AND > 40 kg 2. SARS-COV-2 positive test 3. Symptom onset < 5 days 4. Mild-to-moderate COVID disease with high risk for severe progression to hospitalization or death   Patient:  Has a (+) direct SARS-CoV-2 viral test  result  Has mild or moderate COVID-19   Is NOT hospitalized due to COVID-19  Is within 10 days of symptom onset  Has at least one of the high risk factor(s) for progression to severe COVID-19 and/or hospitalization as defined in EUA.  Specific high risk criteria : Older age (>/= 78 yo), BMI > 25, Immunosuppressive Disease or Treatment and Cardiovascular disease or hypertension   Plan:  Rationale for dual antiviral and monoclonal antibody treatment were discussed in detail.   I have spoken and communicated the following to the patient or parent/caregiver regarding COVID monoclonal antibody treatment:  1. FDA has authorized the emergency use for the treatment of mild to moderate COVID-19 in adults and pediatric patients with positive results of direct SARS-CoV-2 viral testing who are 8 years of age and older weighing at least 40 kg, and who are at high risk for progressing to severe COVID-19 and/or hospitalization.  2. The significant known and potential risks and benefits of COVID monoclonal antibody, and the extent to which such potential risks and benefits are unknown.  3. Information on available alternative treatments and the risks and benefits of those alternatives, including clinical trials.  4. Patients treated with COVID monoclonal antibody should continue to self-isolate and use infection control measures (e.g., wear mask, isolate, social distance, avoid sharing personal items, clean and disinfect "high touch" surfaces, and frequent handwashing) according to CDC guidelines.   5. The patient or parent/caregiver has the option to accept or refuse COVID monoclonal antibody treatment.  1. I have spoken and communicated the following to the patient or parent/caregiver regarding: Paxlovid is an unapproved drug that is authorized for use under an Emergency Use Authorization.  2. There are no adequate, approved, available products for the treatment of COVID-19 in adults who have  mild-to-moderate COVID-19 and are at high risk for progressing to severe COVID-19, including hospitalization or death. 3. Other therapeutics are currently authorized. For additional information on all products authorized for treatment or prevention of COVID-19, please see [employer].  4. There are benefits and risks of taking this treatment as outlined in the "Fact Sheet for Patients and Caregivers."  5. "Fact Sheet for Patients and Caregivers" was reviewed with patient. A hard copy will be provided to patient from pharmacy prior to the patient receiving treatment. 6. Patients should continue to self-isolate and use infection control measures (e.g., wear mask, isolate, social distance, avoid sharing personal items, clean and disinfect "high touch" surfaces, and frequent handwashing) according to CDC guidelines.  7. The patient or parent/caregiver has the option to accept or refuse treatment. 8. Patient medication history was reviewed for potential drug interactions:No drug interactions 9. Patient's creatinine clearance was calculated to be 93.76 ml/min, and they were therefore prescribed Normal dose (CrCl>60) - nirmatrelvir 150mg  tab (2 tablet) by mouth twice  daily AND ritonavir 100mg  tab (1 tablet) by mouth twice daily.   After reviewing above information with the patient, the patient agrees to receive Paxlovid.  Follow up instructions:    . Take prescription BID x 5 days as directed . Reach out to pharmacist for counseling on medication if desired . For concerns regarding further COVID symptoms please follow up with your PCP or urgent care . For urgent or life-threatening issues, seek care at your local emergency department  The patient was provided an opportunity to ask questions, and all were answered. The patient agreed with the plan and demonstrated an understanding of the  instructions.   Script sent to [facility] and opted to pick up RX.  The patient was advised to call their PCP or seek an in-person evaluation if the symptoms worsen or if the condition fails to improve as anticipated.   I provided 30 minutes of non face-to-face telephone visit time during this encounter, and > 50% was spent counseling as documented under my assessment & plan.  [facility], NP 10/22/2020 /12:54 PM

## 2020-10-22 NOTE — Telephone Encounter (Signed)
Patient was prescribed oral covid treatment Paxlovid and treatment note was reviewed. Medication has been received by [facility] and reviewed for appropriateness.  Drug Interactions or Dosage Adjustments Noted: Hold Flonase due to drug interaction and monitor BP due to possible interaction with tamsulosin causing a decrease in BP.  Delivery Method: pick up  Patient contacted for counseling on 10/22/20 and verbalized understanding.   Delivery or Pick-Up Date: 10/22/20   Kirstie Peri 10/22/2020, 2:56 PM [facility] Pharmacist Phone# [phone]

## 2020-10-23 ENCOUNTER — Ambulatory Visit (HOSPITAL_COMMUNITY)
Admission: RE | Admit: 2020-10-23 | Discharge: 2020-10-23 | Disposition: A | Discharge status: Home / Self Care | Payer: Medicare HMO | Source: Ambulatory Visit | Attending: Pulmonary Disease | Admitting: Pulmonary Disease

## 2020-10-23 ENCOUNTER — Other Ambulatory Visit: Payer: Medicare HMO

## 2020-10-23 DIAGNOSIS — U071 COVID-19: Secondary | ICD-10-CM | POA: Diagnosis not present

## 2020-10-23 MED ORDER — ALBUTEROL SULFATE HFA 108 (90 BASE) MCG/ACT IN AERS: 2.0000 | INHALATION_SPRAY | Freq: Once | RESPIRATORY_TRACT | Status: DC | PRN

## 2020-10-23 MED ORDER — FAMOTIDINE IN NACL 20-0.9 MG/50ML-% IV SOLN: 20.0000 mg | Freq: Once | INTRAVENOUS | Status: DC | PRN

## 2020-10-23 MED ORDER — EPINEPHRINE 0.3 MG/0.3ML IJ SOAJ: 0.3000 mg | Freq: Once | INTRAMUSCULAR | Status: DC | PRN

## 2020-10-23 MED ORDER — SOTROVIMAB 500 MG/8ML IV SOLN
500.0000 mg | Freq: Once | INTRAVENOUS | Status: AC
  Administered 2020-10-23: 500 mg via INTRAVENOUS

## 2020-10-23 MED ORDER — SODIUM CHLORIDE 0.9 % IV SOLN: INTRAVENOUS | Status: DC | PRN

## 2020-10-23 MED ORDER — METHYLPREDNISOLONE SODIUM SUCC 125 MG IJ SOLR: 125.0000 mg | Freq: Once | INTRAMUSCULAR | Status: DC | PRN

## 2020-10-23 MED ORDER — DIPHENHYDRAMINE HCL 50 MG/ML IJ SOLN: 50.0000 mg | Freq: Once | INTRAMUSCULAR | Status: DC | PRN

## 2020-10-23 NOTE — Progress Notes (Signed)
Diagnosis: COVID-19  Physician: Dr. Patrick Wright  Procedure: Covid Infusion Clinic Med: Sotrovimab infusion - Provided patient with sotrovimab fact sheet for patients, parents, and caregivers prior to infusion.   Complications: No immediate complications noted  Discharge: Discharged home    

## 2020-10-23 NOTE — Progress Notes (Signed)
Patient reviewed Fact Sheet for Patients, Parents, and Caregivers for Emergency Use Authorization (EUA) of sotrovimab for the Treatment of Coronavirus. Patient also reviewed and is agreeable to the estimated cost of treatment. Patient is agreeable to proceed.   

## 2020-10-23 NOTE — Discharge Instructions (Signed)
10 Things You Can Do to Manage Your COVID-19 Symptoms at Home If you have possible or confirmed COVID-19: 1. Stay home from work and school. And stay away from other public places. If you must go out, avoid using any kind of public transportation, ridesharing, or taxis. 2. Monitor your symptoms carefully. If your symptoms get worse, call your healthcare provider immediately. 3. Get rest and stay hydrated. 4. If you have a medical appointment, call the healthcare provider ahead of time and tell them that you have or may have COVID-19. 5. For medical emergencies, call 911 and notify the dispatch personnel that you have or may have COVID-19. 6. Cover your cough and sneezes with a tissue or use the inside of your elbow. 7. Wash your hands often with soap and water for at least 20 seconds or clean your hands with an alcohol-based hand sanitizer that contains at least 60% alcohol. 8. As much as possible, stay in a specific room and away from other people in your home. Also, you should use a separate bathroom, if available. If you need to be around other people in or outside of the home, wear a mask. 9. Avoid sharing personal items with other people in your household, like dishes, towels, and bedding. 10. Clean all surfaces that are touched often, like counters, tabletops, and doorknobs. Use household cleaning sprays or wipes according to the label instructions. cdc.gov/coronavirus 04/19/2019 This information is not intended to replace advice given to you by your health care provider. Make sure you discuss any questions you have with your health care provider. Document Revised: 09/21/2019 Document Reviewed: 09/21/2019 Elsevier Patient Education  2020 [employer]. What types of side effects do monoclonal antibody drugs cause?  Common side effects  In general, the more common side effects caused by monoclonal antibody drugs include: . Allergic reactions, such as hives or itching . Flu-like signs and  symptoms, including chills, fatigue, fever, and muscle aches and pains . Nausea, vomiting . Diarrhea . Skin rashes . Low blood pressure   The CDC is recommending patients who receive monoclonal antibody treatments wait at least 90 days before being vaccinated.  Currently, there are no data on the safety and efficacy of mRNA COVID-19 vaccines in persons who received monoclonal antibodies or convalescent plasma as part of COVID-19 treatment. Based on the estimated half-life of such therapies as well as evidence suggesting that reinfection is uncommon in the 90 days after initial infection, vaccination should be deferred for at least 90 days, as a precautionary measure until additional information becomes available, to avoid interference of the antibody treatment with vaccine-induced immune responses. If you have any questions or concerns after the infusion please call the Advanced Practice Provider on call at [phone]. This number is ONLY intended for your use regarding questions or concerns about the infusion post-treatment side-effects.  Please do not provide this number to others for use. For return to work notes please contact your primary care provider.   If someone you know is interested in receiving treatment please have them call the COVID hotline at [phone].   

## 2020-10-27 ENCOUNTER — Other Ambulatory Visit: Payer: Self-pay | Admitting: Oncology

## 2020-10-27 MED ORDER — PREDNISONE 10 MG (21) PO TBPK: ORAL_TABLET | ORAL | 0 refills | Status: DC

## 2020-10-28 ENCOUNTER — Ambulatory Visit: Payer: Medicare HMO | Admitting: Internal Medicine

## 2020-10-28 ENCOUNTER — Ambulatory Visit: Payer: Medicare HMO

## 2020-10-28 ENCOUNTER — Other Ambulatory Visit: Payer: Medicare HMO

## 2020-11-04 ENCOUNTER — Other Ambulatory Visit: Payer: Self-pay

## 2020-11-04 ENCOUNTER — Encounter: Payer: Self-pay | Admitting: Internal Medicine

## 2020-11-05 ENCOUNTER — Inpatient Hospital Stay: Payer: Medicare HMO | Admitting: Internal Medicine

## 2020-11-05 ENCOUNTER — Inpatient Hospital Stay: Payer: Medicare HMO

## 2020-11-05 ENCOUNTER — Inpatient Hospital Stay: Payer: Medicare HMO | Attending: Oncology

## 2020-11-05 DIAGNOSIS — C7800 Secondary malignant neoplasm of unspecified lung: Secondary | ICD-10-CM | POA: Diagnosis not present

## 2020-11-05 DIAGNOSIS — C20 Malignant neoplasm of rectum: Secondary | ICD-10-CM | POA: Diagnosis not present

## 2020-11-05 DIAGNOSIS — R109 Unspecified abdominal pain: Secondary | ICD-10-CM | POA: Insufficient documentation

## 2020-11-05 DIAGNOSIS — C787 Secondary malignant neoplasm of liver and intrahepatic bile duct: Secondary | ICD-10-CM | POA: Insufficient documentation

## 2020-11-05 DIAGNOSIS — Z5112 Encounter for antineoplastic immunotherapy: Secondary | ICD-10-CM | POA: Insufficient documentation

## 2020-11-05 DIAGNOSIS — K5900 Constipation, unspecified: Secondary | ICD-10-CM | POA: Insufficient documentation

## 2020-11-05 DIAGNOSIS — Z5111 Encounter for antineoplastic chemotherapy: Secondary | ICD-10-CM | POA: Insufficient documentation

## 2020-11-05 DIAGNOSIS — Z7189 Other specified counseling: Secondary | ICD-10-CM

## 2020-11-05 DIAGNOSIS — C182 Malignant neoplasm of ascending colon: Secondary | ICD-10-CM | POA: Diagnosis not present

## 2020-11-05 DIAGNOSIS — R1030 Lower abdominal pain, unspecified: Secondary | ICD-10-CM | POA: Diagnosis not present

## 2020-11-05 LAB — CBC WITH DIFFERENTIAL/PLATELET
Abs Immature Granulocytes: 0.03 10*3/uL (ref 0.00–0.07)
Basophils Absolute: 0 10*3/uL (ref 0.0–0.1)
Basophils Relative: 1 %
Eosinophils Absolute: 0.2 10*3/uL (ref 0.0–0.5)
Eosinophils Relative: 3 %
HCT: 34.4 % — ABNORMAL LOW (ref 39.0–52.0)
Hemoglobin: 11.8 g/dL — ABNORMAL LOW (ref 13.0–17.0)
Immature Granulocytes: 1 %
Lymphocytes Relative: 16 %
Lymphs Abs: 0.9 10*3/uL (ref 0.7–4.0)
MCH: 34.4 pg — ABNORMAL HIGH (ref 26.0–34.0)
MCHC: 34.3 g/dL (ref 30.0–36.0)
MCV: 100.3 fL — ABNORMAL HIGH (ref 80.0–100.0)
Monocytes Absolute: 0.6 10*3/uL (ref 0.1–1.0)
Monocytes Relative: 12 %
Neutro Abs: 3.6 10*3/uL (ref 1.7–7.7)
Neutrophils Relative %: 67 %
Platelets: 162 10*3/uL (ref 150–400)
RBC: 3.43 MIL/uL — ABNORMAL LOW (ref 4.22–5.81)
RDW: 15.4 % (ref 11.5–15.5)
WBC: 5.3 10*3/uL (ref 4.0–10.5)
nRBC: 0 % (ref 0.0–0.2)

## 2020-11-05 LAB — URINALYSIS, COMPLETE (UACMP) WITH MICROSCOPIC
Bacteria, UA: NONE SEEN
Bilirubin Urine: NEGATIVE
Glucose, UA: 50 mg/dL — AB
Hgb urine dipstick: NEGATIVE
Ketones, ur: NEGATIVE mg/dL
Leukocytes,Ua: NEGATIVE
Nitrite: NEGATIVE
Protein, ur: 30 mg/dL — AB
Specific Gravity, Urine: 1.028 (ref 1.005–1.030)
pH: 5 (ref 5.0–8.0)

## 2020-11-05 LAB — COMPREHENSIVE METABOLIC PANEL
ALT: 19 U/L (ref 0–44)
AST: 32 U/L (ref 15–41)
Albumin: 2.4 g/dL — ABNORMAL LOW (ref 3.5–5.0)
Alkaline Phosphatase: 73 U/L (ref 38–126)
Anion gap: 5 (ref 5–15)
BUN: 9 mg/dL (ref 8–23)
CO2: 26 mmol/L (ref 22–32)
Calcium: 7.8 mg/dL — ABNORMAL LOW (ref 8.9–10.3)
Chloride: 107 mmol/L (ref 98–111)
Creatinine, Ser: 0.82 mg/dL (ref 0.61–1.24)
GFR, Estimated: >60 mL/min (ref >60)
Glucose, Bld: 226 mg/dL — ABNORMAL HIGH (ref 70–99)
Potassium: 3.6 mmol/L (ref 3.5–5.1)
Sodium: 138 mmol/L (ref 135–145)
Total Bilirubin: 0.6 mg/dL (ref 0.3–1.2)
Total Protein: 5.8 g/dL — ABNORMAL LOW (ref 6.5–8.1)

## 2020-11-05 MED ORDER — NYSTATIN 100000 UNIT/ML MT SUSP: 5.0000 mL | Freq: Four times a day (QID) | OROMUCOSAL | 0 refills | Status: DC

## 2020-11-05 MED ORDER — DIPHENOXYLATE-ATROPINE 2.5-0.025 MG PO TABS
1.0000 | ORAL_TABLET | Freq: Once | ORAL | Status: DC
  Filled 2020-11-05: qty 1

## 2020-11-05 MED ORDER — SODIUM CHLORIDE 0.9 % IV SOLN
180.0000 mg/m2 | Freq: Once | INTRAVENOUS | Status: AC
  Administered 2020-11-05: 380 mg via INTRAVENOUS
  Filled 2020-11-05: qty 15

## 2020-11-05 MED ORDER — FLUOROURACIL CHEMO INJECTION 5 GM/100ML
5000.0000 mg | INTRAVENOUS | Status: DC
  Administered 2020-11-05: 5000 mg via INTRAVENOUS
  Filled 2020-11-05: qty 100

## 2020-11-05 MED ORDER — HEPARIN SOD (PORK) LOCK FLUSH 100 UNIT/ML IV SOLN
500.0000 [IU] | Freq: Once | INTRAVENOUS | Status: DC
  Filled 2020-11-05: qty 5

## 2020-11-05 MED ORDER — SODIUM CHLORIDE 0.9 % IV SOLN
Freq: Once | INTRAVENOUS | Status: AC
  Filled 2020-11-05: qty 250

## 2020-11-05 MED ORDER — DIPHENOXYLATE-ATROPINE 2.5-0.025 MG PO TABS
1.0000 | ORAL_TABLET | Freq: Once | ORAL | Status: AC
  Administered 2020-11-05: 1 via ORAL
  Filled 2020-11-05: qty 1

## 2020-11-05 MED ORDER — SODIUM CHLORIDE 0.9 % IV SOLN
5.0000 mg/kg | Freq: Once | INTRAVENOUS | Status: AC
  Administered 2020-11-05: 400 mg via INTRAVENOUS
  Filled 2020-11-05: qty 16

## 2020-11-05 MED ORDER — PALONOSETRON HCL INJECTION 0.25 MG/5ML
0.2500 mg | Freq: Once | INTRAVENOUS | Status: AC
  Administered 2020-11-05: 0.25 mg via INTRAVENOUS
  Filled 2020-11-05: qty 5

## 2020-11-05 MED ORDER — SODIUM CHLORIDE 0.9 % IV SOLN
10.0000 mg | Freq: Once | INTRAVENOUS | Status: AC
  Administered 2020-11-05: 10 mg via INTRAVENOUS
  Filled 2020-11-05: qty 10

## 2020-11-05 MED ORDER — SODIUM CHLORIDE 0.9% FLUSH
10.0000 mL | Freq: Once | INTRAVENOUS | Status: AC
  Administered 2020-11-05: 10 mL via INTRAVENOUS
  Filled 2020-11-05: qty 10

## 2020-11-05 NOTE — Progress Notes (Signed)
Pt stable at discharge.  

## 2020-11-05 NOTE — Progress Notes (Signed)
[facility] CONSULT NOTE  Patient Care Team: Marina Goodell, MD as PCP - General (Family Medicine) Rickard Patience, MD as Consulting Physician (Hematology and Oncology)  CHIEF COMPLAINTS/PURPOSE OF CONSULTATION: Colon cancer  #  Oncology History Overview Note  # MAY 2020- 3. 03/09/19 Liver biopsy. Microscopic examination shows malignant cells with glandular architecture consistent with adenocarcinoma. The malignant cells are positive for CK20 and CDX-2. These findings support the clinical impression of metastatic colon adenocarcinoma. 4. 03/10/19 R hemicolectomy. Tumor site cecum. Adenocarcinoma. Mucinous features present. G2. No tumor deposits. Invades visceral peritoneum. No tumor perforation. LVI present. PNI not identified. All margins uninvolved. 1/12 LNs. PT4apN1. Periappendiceal inflammation c/w resolving abscess. Microsatellite stable (MSS). [Dr.Mettu; [facility]]  # SEP 4th 2020 [compared to May 2020]  Interval increase in size of the metastases to the hepatic dome, The metastasis to the left hepatic lobe is unchanged; 2.  New subcentimeter hypoattenuating lesion in the inferior right hepatic lobe, incompletely characterized on CT. 3.  Postsurgical changes following right hemicolectomy.  # OCT 2020- FOLFOX +avastin; CT dec 22nd 2020- [compared to [facility] sep 9th 2020]-Liver- slight progression versus stable disease; CT scan SEP 4th 2021- Progressive disease-left lower lobe lung nodule 8 mm [previously 4 mm]; increase in size of the hepatic metastases by few millimeters.;  Soft tissue nodule adjacent to anastomotic site again increased by few millimeters.STOP FOLFOX; cont avastin  #  SEP 20th, 2021- FOLFIRI+ AVASTIN  # JAN 2022- COVID [s/p Mab infusion; pills; skin rash resolved.]  # NGS/F-ONE-MUTATED K-RAS [G]  # PALLIATIVE CARE EVALUATION: 09/20/2019-[name]  # PAIN MANAGEMENT: NA   DIAGNOSIS: COLON CANCER  STAGE:  IV     ;  GOALS:Palliative  CURRENT/MOST RECENT THERAPY :  FOLFIRI+ avastin [C]    Cancer of right colon (HCC)  07/05/2019 Initial Diagnosis   Cancer of right colon (HCC)   07/24/2019 - 06/25/2020 Chemotherapy   The patient had dexamethasone (DECADRON) 4 MG tablet, 8 mg, Oral, Daily, 1 of 1 cycle, Start date: --, End date: -- palonosetron (ALOXI) injection 0.25 mg, 0.25 mg, Intravenous,  Once, 23 of 25 cycles Administration: 0.25 mg (07/24/2019), 0.25 mg (08/07/2019), 0.25 mg (08/21/2019), 0.25 mg (09/04/2019), 0.25 mg (09/20/2019), 0.25 mg (10/04/2019), 0.25 mg (10/16/2019), 0.25 mg (10/30/2019), 0.25 mg (11/22/2019), 0.25 mg (12/06/2019), 0.25 mg (12/20/2019), 0.25 mg (01/03/2020), 0.25 mg (01/29/2020), 0.25 mg (02/12/2020), 0.25 mg (02/26/2020), 0.25 mg (03/11/2020), 0.25 mg (03/25/2020), 0.25 mg (04/08/2020), 0.25 mg (04/24/2020), 0.25 mg (05/08/2020), 0.25 mg (05/22/2020), 0.25 mg (06/10/2020), 0.25 mg (06/25/2020) leucovorin 800 mg in dextrose 5 % 250 mL infusion, 844 mg, Intravenous,  Once, 23 of 25 cycles Administration: 800 mg (07/24/2019), 800 mg (08/07/2019), 800 mg (08/21/2019), 800 mg (09/04/2019), 800 mg (09/20/2019), 800 mg (10/04/2019), 800 mg (10/16/2019), 800 mg (10/30/2019), 800 mg (11/22/2019), 800 mg (12/06/2019), 800 mg (12/20/2019), 800 mg (01/03/2020), 800 mg (01/29/2020), 800 mg (02/12/2020), 800 mg (02/26/2020), 800 mg (03/11/2020), 800 mg (03/25/2020), 800 mg (04/08/2020), 800 mg (04/24/2020), 800 mg (05/08/2020), 800 mg (05/22/2020), 800 mg (06/10/2020), 800 mg (06/25/2020) oxaliplatin (ELOXATIN) 180 mg in dextrose 5 % 500 mL chemo infusion, 85 mg/m2 = 180 mg, Intravenous,  Once, 23 of 25 cycles Dose modification: 178 mg (original dose 85 mg/m2, Cycle 17, Reason: Other (see comments), Comment: insurance adjusted dose ) Administration: 180 mg (07/24/2019), 180 mg (08/07/2019), 180 mg (08/21/2019), 180 mg (09/04/2019), 180 mg (09/20/2019), 180 mg (10/04/2019), 180 mg (10/16/2019), 180 mg (10/30/2019), 180 mg (11/22/2019), 180 mg (12/06/2019), 180 mg (12/20/2019), 180  mg (01/03/2020), 180 mg  (01/29/2020), 180 mg (02/12/2020), 180 mg (02/26/2020), 180 mg (03/11/2020), 180 mg (04/08/2020), 180 mg (04/24/2020), 180 mg (05/08/2020), 180 mg (05/22/2020), 180 mg (06/10/2020), 180 mg (06/25/2020) fluorouracil (ADRUCIL) 5,000 mg in sodium chloride 0.9 % 150 mL chemo infusion, 5,050 mg, Intravenous, 1 Day/Dose, 23 of 25 cycles Administration: 5,000 mg (07/24/2019), 5,000 mg (08/07/2019), 5,000 mg (08/21/2019), 5,050 mg (09/04/2019), 5,000 mg (10/04/2019), 5,000 mg (10/16/2019), 5,000 mg (11/22/2019), 5,000 mg (12/06/2019), 5,000 mg (12/20/2019), 5,000 mg (01/03/2020), 5,000 mg (01/29/2020), 5,000 mg (02/12/2020), 5,000 mg (02/26/2020), 5,000 mg (03/11/2020), 5,000 mg (03/25/2020), 5,000 mg (04/08/2020), 5,000 mg (04/24/2020), 5,000 mg (05/08/2020), 5,000 mg (05/22/2020), 5,000 mg (06/10/2020), 5,000 mg (06/25/2020) bevacizumab-bvzr (ZIRABEV) 400 mg in sodium chloride 0.9 % 100 mL chemo infusion, 5 mg/kg = 400 mg, Intravenous,  Once, 23 of 25 cycles Administration: 400 mg (08/07/2019), 400 mg (08/21/2019), 400 mg (09/04/2019), 400 mg (09/20/2019), 400 mg (10/04/2019), 400 mg (10/16/2019), 400 mg (10/30/2019), 400 mg (11/22/2019), 400 mg (12/06/2019), 400 mg (12/20/2019), 400 mg (01/03/2020), 400 mg (01/29/2020), 400 mg (02/12/2020), 400 mg (02/26/2020), 400 mg (03/11/2020), 400 mg (03/25/2020), 400 mg (04/08/2020), 400 mg (04/24/2020), 400 mg (05/08/2020), 400 mg (05/22/2020), 400 mg (06/10/2020), 400 mg (06/25/2020)  for chemotherapy treatment.    07/08/2020 -  Chemotherapy    Patient is on Treatment Plan: COLORECTAL FOLFIRI / BEVACIZUMAB Q14D      11/05/2020 Cancer Staging   Staging form: Colon and Rectum, AJCC 8th Edition - Clinical: Stage IVC (pM1c) - Signed by Cammie Sickle, MD on 11/05/2020     HISTORY OF PRESENTING ILLNESS:  Oscar [name] 80 y.o.  male with metastatic colon cancer to the liver currently on FOLFIRI plus Avastin chemotherapy.  In the interim patient was diagnosed with COVID-symptomatic needing monoclonal antibody  infusion.  Posttreatment patient noted to have a significant rash; treated with steroids.  Currently resolved.  Patient denies any new symptoms or shortness of breath or cough.  Denies any difficulty swallowing or pain with swallowing.  Chronic back pain not any worse.   Review of Systems  Constitutional: Positive for malaise/fatigue. Negative for chills, diaphoresis, fever and weight loss.  HENT: Negative for nosebleeds and sore throat.   Eyes: Negative for double vision.  Respiratory: Negative for cough, hemoptysis, sputum production, shortness of breath and wheezing.   Cardiovascular: Negative for chest pain, palpitations, orthopnea and leg swelling.  Gastrointestinal: Negative for abdominal pain, blood in stool, constipation, diarrhea, heartburn, melena, nausea and vomiting.  Genitourinary: Negative for dysuria, frequency and urgency.  Musculoskeletal: Positive for back pain and joint pain.  Skin: Negative.  Negative for itching and rash.  Neurological: Positive for tingling. Negative for dizziness, focal weakness, weakness and headaches.  Endo/Heme/Allergies: Does not bruise/bleed easily.  Psychiatric/Behavioral: Negative for depression. The patient is not nervous/anxious and does not have insomnia.      MEDICAL HISTORY:  Past Medical History:  Diagnosis Date  . Arthritis   . Cancer ([place])    colon cancer 02/2019 per pt   . Diabetes mellitus without complication ([place])   . H/O colon cancer, stage IV   . Hyperlipemia   . Hypertension     SURGICAL HISTORY: Past Surgical History:  Procedure Laterality Date  . IR IMAGING GUIDED PORT INSERTION  07/20/2019  . JOINT REPLACEMENT      SOCIAL HISTORY: Social History   Socioeconomic History  . Marital status: Married    Spouse name: Not on file  . Number of children: Not on  file  . Years of education: Not on file  . Highest education level: Not on file  Occupational History  . Not on file  Tobacco Use  . Smoking status:  Current Every Day Smoker    Packs/day: 0.25    Types: Cigarettes  . Smokeless tobacco: Never Used  . Tobacco comment: 1 to 2 cigarettes a day occasionally  Vaping Use  . Vaping Use: Never used  Substance and Sexual Activity  . Alcohol use: No  . Drug use: No  . Sexual activity: Not on file  Other Topics Concern  . Not on file  Social History Narrative   Recruitment consultant retd; lives in [place]; smoking 3cig/day; [3/4 ppd x started at 9 years]; no alcohol. Son & daughter; wife dementia [waiting for placement].    Social Determinants of Health   Financial Resource Strain: Not on file  Food Insecurity: Not on file  Transportation Needs: Not on file  Physical Activity: Not on file  Stress: Not on file  Social Connections: Not on file  Intimate Partner Violence: Not on file    FAMILY HISTORY: Family History  Problem Relation Age of Onset  . Peptic Ulcer Disease Father     ALLERGIES:  is allergic to ace inhibitors.  MEDICATIONS:  Current Outpatient Medications  Medication Sig Dispense Refill  . amLODipine (NORVASC) 5 MG tablet Take 5 mg by mouth daily.    . meloxicam (MOBIC) 15 MG tablet Take 1 tablet (15 mg total) by mouth daily. 90 tablet 0  . nystatin (MYCOSTATIN) 100000 UNIT/ML suspension Take 5 mLs (500,000 Units total) by mouth 4 (four) times daily. 60 mL 0  . pravastatin (PRAVACHOL) 40 MG tablet Take 40 mg by mouth daily.     . tamsulosin (FLOMAX) 0.4 MG CAPS capsule Take 1 capsule (0.4 mg total) by mouth daily. 30 capsule 11  . fluticasone (FLONASE) 50 MCG/ACT nasal spray Place 1 spray into both nostrils daily as needed.  (Patient not taking: Reported on 11/04/2020)    . montelukast (SINGULAIR) 10 MG tablet TAKE 1 TABLET BY MOUTH EVERYDAY AT BEDTIME (Patient not taking: No sig reported) 90 tablet 1  . ondansetron (ZOFRAN-ODT) 4 MG disintegrating tablet Take 1 tablet (4 mg total) by mouth every 6 (six) hours as needed for nausea. (Patient not taking: No sig  reported) 20 tablet 0  . prochlorperazine (COMPAZINE) 10 MG tablet Take 1 tablet (10 mg total) by mouth every 6 (six) hours as needed for nausea or vomiting. (Patient not taking: No sig reported) 40 tablet 1   No current facility-administered medications for this visit.   Facility-Administered Medications Ordered in Other Visits  Medication Dose Route Frequency Provider Last Rate Last Admin  . fluorouracil (ADRUCIL) 5,000 mg in sodium chloride 0.9 % 150 mL chemo infusion  5,000 mg Intravenous 1 day or 1 dose Charlaine Dalton R, MD      . heparin lock flush 100 unit/mL  500 Units Intravenous Once Charlaine Dalton R, MD      . irinotecan (CAMPTOSAR) 380 mg in sodium chloride 0.9 % 500 mL chemo infusion  180 mg/m2 (Treatment Plan Recorded) Intravenous Once Cammie Sickle, MD 346 mL/hr at 11/05/20 1246 380 mg at 11/05/20 1246      .  PHYSICAL EXAMINATION: ECOG PERFORMANCE STATUS: 0 - Asymptomatic  Vitals:   11/05/20 1140  BP: (!) 104/53  Pulse: 71  Resp: 20  Temp: 98.1 F (36.7 C)  SpO2: 100%   Filed Weights   11/04/20  1400  Weight: 187 lb (84.8 kg)    Physical Exam HENT:     Head: Normocephalic and atraumatic.     Mouth/Throat:     Pharynx: No oropharyngeal exudate.     Comments: Whitish patches noted right side.  Eyes:     Pupils: Pupils are equal, round, and reactive to light.  Cardiovascular:     Rate and Rhythm: Normal rate and regular rhythm.  Pulmonary:     Effort: No respiratory distress.     Breath sounds: No wheezing.     Comments: Decreased breath sounds bilaterally.  No wheeze or crackles Abdominal:     General: Bowel sounds are normal. There is no distension.     Palpations: Abdomen is soft. There is no mass.     Tenderness: There is no abdominal tenderness. There is no guarding or rebound.  Musculoskeletal:        General: No tenderness. Normal range of motion.     Cervical back: Normal range of motion and neck supple.  Skin:    General:  Skin is warm.  Neurological:     Mental Status: He is alert and oriented to person, place, and time.  Psychiatric:        Mood and Affect: Affect normal.    LABORATORY DATA:  I have reviewed the data as listed Lab Results  Component Value Date   WBC 5.3 11/05/2020   HGB 11.8 (L) 11/05/2020   HCT 34.4 (L) 11/05/2020   MCV 100.3 (H) 11/05/2020   PLT 162 11/05/2020   Recent Labs    06/25/20 0841 07/08/20 0843 07/22/20 0836 08/05/20 0821 09/30/20 0823 10/14/20 0804 11/05/20 1023  NA 138 139 138   < > 136 138 138  K 3.6 3.6 3.6   < > 3.7 3.8 3.6  CL 108 108 107   < > 105 106 107  CO2 $Re'24 25 25   'cxC$ < > $R'24 25 26  'vu$ GLUCOSE 201* 209* 195*   < > 206* 209* 226*  BUN $Re'10 9 10   'HJD$ < > $R'10 13 9  'fC$ CREATININE 0.80 0.70 0.89   < > 0.74 0.81 0.82  CALCIUM 8.0* 8.0* 8.1*   < > 8.1* 8.3* 7.8*  GFRNONAA >60 >60 >60   < > >60 >60 >60  GFRAA >60 >60 >60  --   --   --   --   PROT 6.5 6.5 6.7   < > 5.9* 6.1* 5.8*  ALBUMIN 3.2* 3.1* 3.1*   < > 3.0* 3.0* 2.4*  AST 32 31 29   < > 32 29 32  ALT $Re'20 19 19   'XBB$ < > $R'20 20 19  'uR$ ALKPHOS 121 102 108   < > 90 94 73  BILITOT 0.8 0.7 0.8   < > 0.9 0.8 0.6   < > = values in this interval not displayed.    RADIOGRAPHIC STUDIES: I have personally reviewed the radiological images as listed and agreed with the findings in the report. No results found.  ASSESSMENT & PLAN:   Cancer of right colon ([place]) #Right-sided colon adenocarcinoma-with synchronous metastasis to liver/unresectable. CT scan - DEC 7th, 2021-mild progression of the pulmonary metastases [but again less than centimeter nodules]; minimal improvement to stable liver lesions; peritoneal implant next anastomosis. On FOLFIRI plus Avastin. STABLE. Monitor CEA closely.   #Proceed with today- # 9 of  FOLFIRI plus Avastin chemotherapy.  Labs today reviewed;  acceptable for treatment today.   # Oral thrush- recommend  nystain mouth wash.   # COVID Jan 2022-status post antibody infusion; skin rash-resolved.   Monitor closely.  # PN- G-1-2; sec to oxaliplatin.  STABLE.   # DISPOSITION:  #FOLFIRI+ Avastin today; pump off in 2 days.  # follow up in 2 weeks- MD; labs- cbc/cmp/UA/ CEA; FOLFIRI + avastin;pump off in 2 days'; -Dr.B    All questions were answered. The patient knows to call the clinic with any problems, questions or concerns.    Cammie Sickle, MD 11/05/2020 1:08 PM

## 2020-11-05 NOTE — Assessment & Plan Note (Addendum)
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 CT scan - DEC 7th, 2021-mild progression of the pulmonary metastases [but again less than centimeter nodules]; minimal improvement to stable liver lesions; peritoneal implant next anastomosis. On FOLFIRI plus Avastin. STABLE. Monitor CEA closely.   #Proceed with today- # 9 of  FOLFIRI plus Avastin chemotherapy.  Labs today reviewed;  acceptable for treatment today.   # Oral thrush- recommend nystain mouth wash.   # COVID Jan 2022-status post antibody infusion; skin rash-resolved.  Monitor closely.  # PN- G-1-2; sec to oxaliplatin.  STABLE.   # DISPOSITION:  #FOLFIRI+ Avastin today; pump off in 2 days.  # follow up in 2 weeks- MD; labs- cbc/cmp/UA/ CEA; FOLFIRI + avastin;pump off in 2 days'; -Dr.B

## 2020-11-06 LAB — CEA: CEA: 17.9 ng/mL — ABNORMAL HIGH (ref 0.0–4.7)

## 2020-11-07 ENCOUNTER — Inpatient Hospital Stay: Payer: Medicare HMO

## 2020-11-07 ENCOUNTER — Inpatient Hospital Stay (HOSPITAL_BASED_OUTPATIENT_CLINIC_OR_DEPARTMENT_OTHER): Payer: Medicare HMO | Admitting: Oncology

## 2020-11-07 ENCOUNTER — Ambulatory Visit
Admission: RE | Admit: 2020-11-07 | Discharge: 2020-11-07 | Disposition: A | Discharge status: Home / Self Care | Payer: Medicare HMO | Source: Ambulatory Visit | Attending: Oncology | Admitting: Oncology

## 2020-11-07 ENCOUNTER — Ambulatory Visit
Admission: RE | Admit: 2020-11-07 | Discharge: 2020-11-07 | Disposition: A | Discharge status: Home / Self Care | Payer: Medicare HMO | Attending: Oncology | Admitting: Oncology

## 2020-11-07 ENCOUNTER — Telehealth: Payer: Self-pay | Admitting: Oncology

## 2020-11-07 ENCOUNTER — Other Ambulatory Visit: Payer: Self-pay

## 2020-11-07 VITALS — BP 168/70 | HR 55 | Temp 96.3°F | Resp 20

## 2020-11-07 DIAGNOSIS — C787 Secondary malignant neoplasm of liver and intrahepatic bile duct: Secondary | ICD-10-CM | POA: Diagnosis not present

## 2020-11-07 DIAGNOSIS — Z7189 Other specified counseling: Secondary | ICD-10-CM

## 2020-11-07 DIAGNOSIS — R1010 Upper abdominal pain, unspecified: Secondary | ICD-10-CM

## 2020-11-07 DIAGNOSIS — C182 Malignant neoplasm of ascending colon: Secondary | ICD-10-CM

## 2020-11-07 DIAGNOSIS — R1030 Lower abdominal pain, unspecified: Secondary | ICD-10-CM | POA: Diagnosis not present

## 2020-11-07 DIAGNOSIS — Z5111 Encounter for antineoplastic chemotherapy: Secondary | ICD-10-CM | POA: Diagnosis not present

## 2020-11-07 DIAGNOSIS — C20 Malignant neoplasm of rectum: Secondary | ICD-10-CM | POA: Diagnosis not present

## 2020-11-07 DIAGNOSIS — Z5112 Encounter for antineoplastic immunotherapy: Secondary | ICD-10-CM | POA: Diagnosis not present

## 2020-11-07 DIAGNOSIS — R109 Unspecified abdominal pain: Secondary | ICD-10-CM | POA: Diagnosis not present

## 2020-11-07 DIAGNOSIS — C7800 Secondary malignant neoplasm of unspecified lung: Secondary | ICD-10-CM | POA: Diagnosis not present

## 2020-11-07 DIAGNOSIS — K5900 Constipation, unspecified: Secondary | ICD-10-CM | POA: Diagnosis not present

## 2020-11-07 IMAGING — CR DG ABDOMEN 2V
1 series · 3 of 3 positions shown · non-contrast
Comparison: CT from [DATE]

CLINICAL DATA: History of abdominal pain and bloating for 2 days,
history of colon carcinoma with metastatic disease

EXAM:
ABDOMEN - 2 VIEW

[Series 1: dg abd 2 views · 0.14mm/px · 3 of 3 slices shown]
[im 1/3]
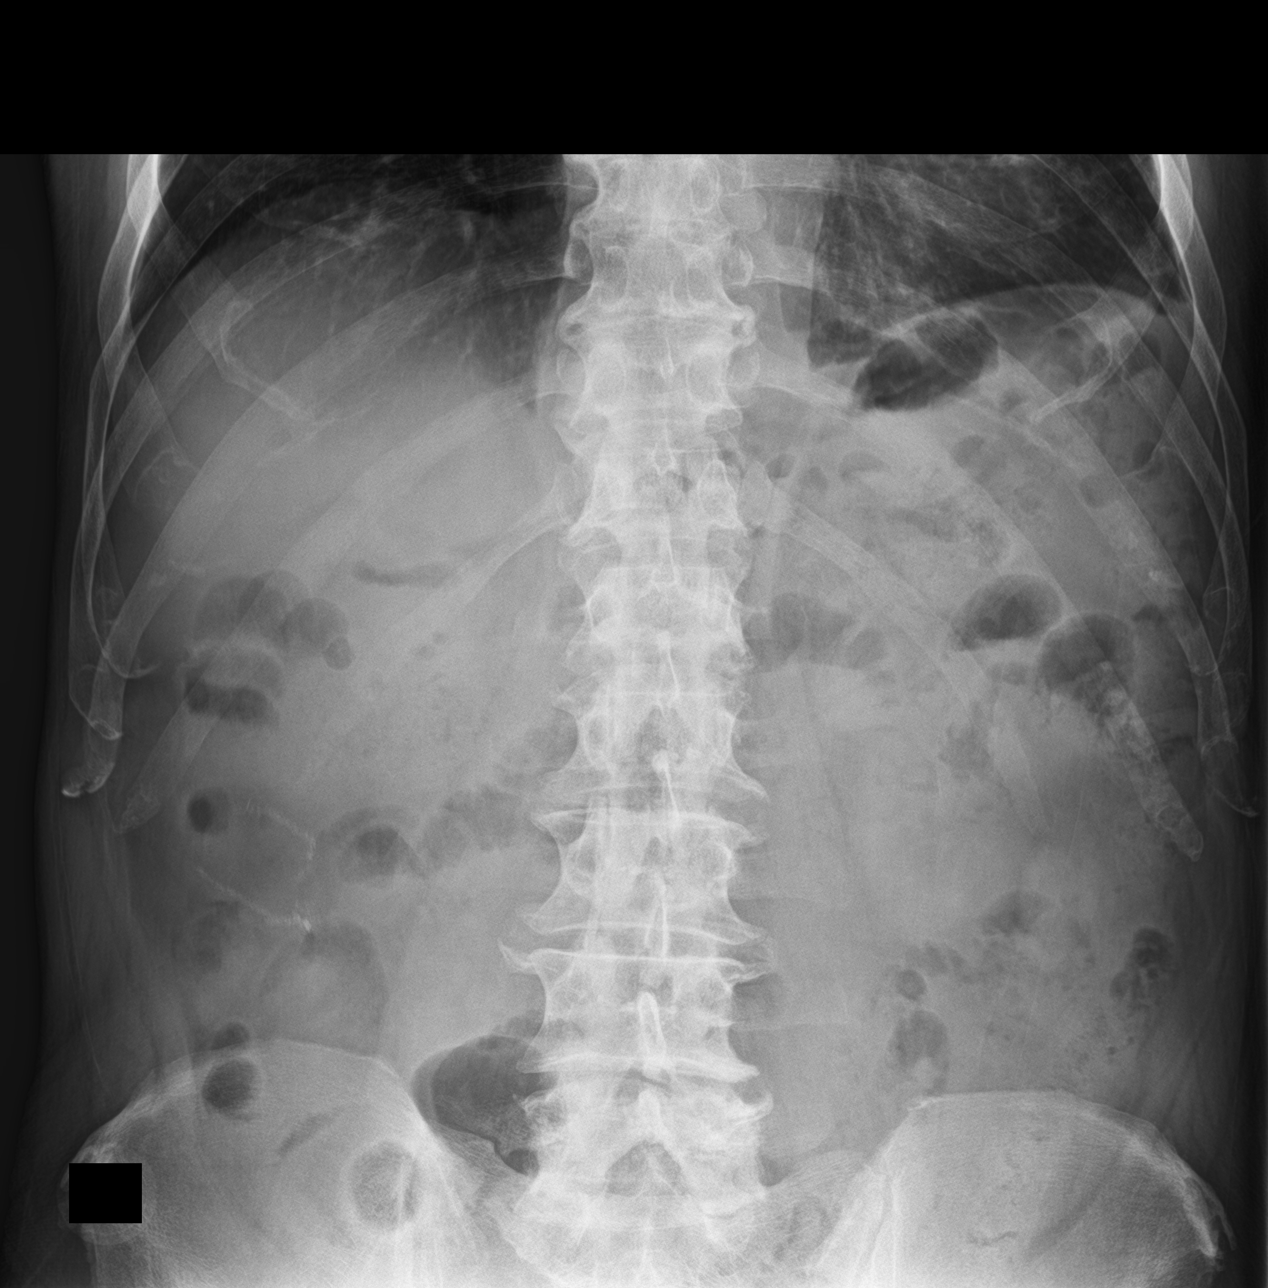
[im 2/3]
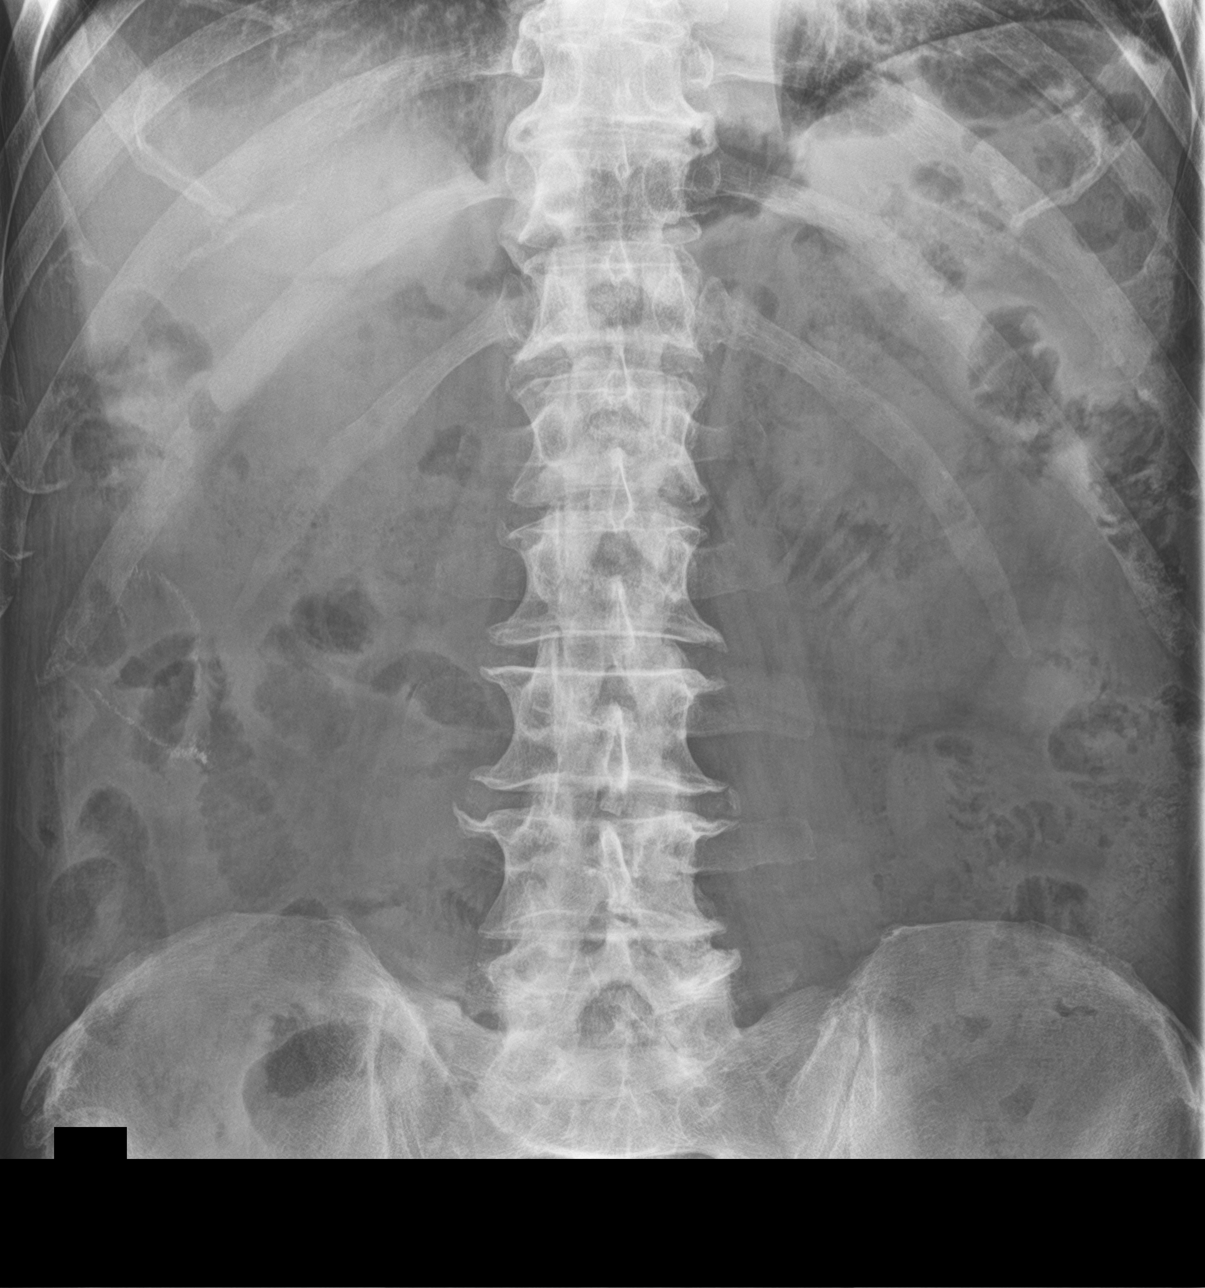
[im 3/3]
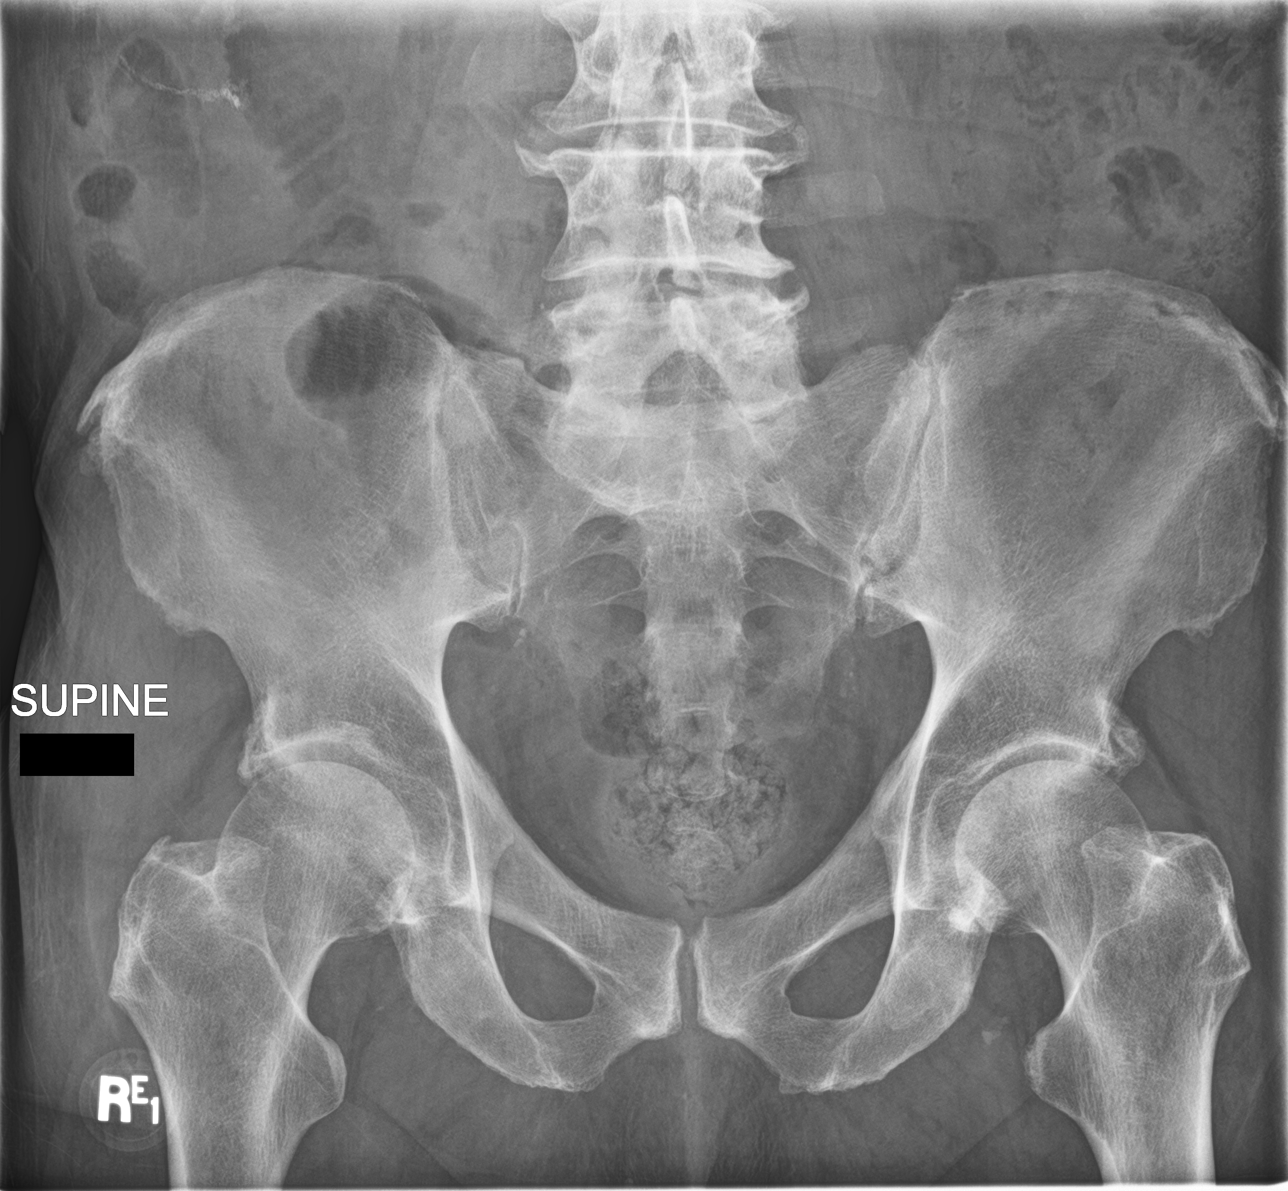

[3 of 3 positions shown; findings below may reference images not displayed]

FINDINGS: Scattered large and small bowel gas is noted. No free air is seen.
No abnormal mass or abnormal calcifications are noted. Postsurgical
changes in the right colon are noted. Degenerative changes of lumbar
spine are noted.
IMPRESSION: No acute abnormality noted.

## 2020-11-07 MED ORDER — HEPARIN SOD (PORK) LOCK FLUSH 100 UNIT/ML IV SOLN
500.0000 [IU] | Freq: Once | INTRAVENOUS | Status: AC | PRN
  Administered 2020-11-07: 500 [IU]
  Filled 2020-11-07: qty 5

## 2020-11-07 MED ORDER — SODIUM CHLORIDE 0.9% FLUSH
10.0000 mL | INTRAVENOUS | Status: DC | PRN
  Filled 2020-11-07: qty 10

## 2020-11-07 MED ORDER — ONDANSETRON HCL 4 MG/2ML IJ SOLN
8.0000 mg | Freq: Once | INTRAMUSCULAR | Status: AC
  Administered 2020-11-07: 8 mg via INTRAVENOUS
  Filled 2020-11-07: qty 4

## 2020-11-07 MED ORDER — HEPARIN SOD (PORK) LOCK FLUSH 100 UNIT/ML IV SOLN
INTRAVENOUS | Status: AC
  Filled 2020-11-07: qty 5

## 2020-11-07 MED ORDER — SODIUM CHLORIDE 0.9 % IV SOLN
Freq: Once | INTRAVENOUS | Status: AC
  Filled 2020-11-07: qty 250

## 2020-11-07 NOTE — Progress Notes (Signed)
Pt arrived to infusion for pump dc.  States has not had a BM in 3 days and has pain in abdomen , as well as nausea that started this morning. States not eating/ drinking well . States took one dose stool softener yesterday without results and today anti-emetic. Rulon Abide NP in infusion to evaluate pt. Gave IV fluids / Zofran as ordered. Pt to go for x-ray outpatient as ordered. NP to follow up with pt. Pt discharged stable and had family member picking him up to bring him for xray now.

## 2020-11-07 NOTE — Progress Notes (Signed)
Symptom Management Consult note [facility]  [phone] [phone]  Patient Care Team: Sofie Hartigan, MD as PCP - General (Family Medicine) [name], MD as Consulting Physician (Hematology and Oncology)   Name of the patient: Oscar [name]  [id number]  November 16, 1940   Date of visit: 11/07/2020   Diagnosis-metastatic colon cancer  Chief complaint/ Reason for visit-abdominal pain  Heme/Onc history: Mr. Dombek is a 80 year old male with past medical history significant for acute appendicitis and metastatic colon cancer.  He is followed by Dr. Rogue Bussing and is status post 9 cycles of FOLFIRI.   Interval history-  He presented today for discontinuation of his pump and complains of abdominal pain, nausea and constipation.  Constipation has been present for 3 days. He states he typically has daily bowel movements.  His last BM was on Monday.  He developed mild nausea yesterday afternoon and took a Zofran with some relief.  He did try a stool softener with no BM.  His abdominal pain started this morning around 11 AM.  Abdominal pain is epigastric and radiates towards his back.  He states he is very uncomfortable.  He does not have to have a bowel movement.  He denies any nausea at this time.  Denies any trouble urinating.  Denies any chest pain or shortness of breath.  ECOG FS:1 - Symptomatic but completely ambulatory  Review of systems- Review of Systems  Constitutional: Negative.  Negative for chills, fever, malaise/fatigue and weight loss.  HENT: Negative for congestion, ear pain and tinnitus.   Eyes: Negative.  Negative for blurred vision and double vision.  Respiratory: Negative.  Negative for cough, sputum production and shortness of breath.   Cardiovascular: Negative.  Negative for chest pain, palpitations and leg swelling.  Gastrointestinal: Negative.  Negative for abdominal pain, constipation, diarrhea, nausea and vomiting.   Genitourinary: Negative for dysuria, frequency and urgency.  Musculoskeletal: Negative for back pain and falls.  Skin: Negative.  Negative for rash.  Neurological: Negative.  Negative for weakness and headaches.  Endo/Heme/Allergies: Negative.  Does not bruise/bleed easily.  Psychiatric/Behavioral: Negative.  Negative for depression. The patient is not nervous/anxious and does not have insomnia.      Current treatment- s/p 9 cycles of FOLFIRI plus Avastin  Allergies  Allergen Reactions  . Ace Inhibitors Swelling     Past Medical History:  Diagnosis Date  . Arthritis   . Cancer ([place])    colon cancer 02/2019 per pt   . Diabetes mellitus without complication ([place])   . H/O colon cancer, stage IV   . Hyperlipemia   . Hypertension      Past Surgical History:  Procedure Laterality Date  . IR IMAGING GUIDED PORT INSERTION  07/20/2019  . JOINT REPLACEMENT      Social History   Socioeconomic History  . Marital status: Married    Spouse name: Not on file  . Number of children: Not on file  . Years of education: Not on file  . Highest education level: Not on file  Occupational History  . Not on file  Tobacco Use  . Smoking status: Current Every Day Smoker    Packs/day: 0.25    Types: Cigarettes  . Smokeless tobacco: Never Used  . Tobacco comment: 1 to 2 cigarettes a day occasionally  Vaping Use  . Vaping Use: Never used  Substance and Sexual Activity  . Alcohol use: No  . Drug use: No  . Sexual activity: Not on  file  Other Topics Concern  . Not on file  Social History Narrative   Recruitment consultant retd; lives in [place]; smoking 3cig/day; [3/4 ppd x started at 9 years]; no alcohol. Son & daughter; wife dementia [waiting for placement].    Social Determinants of Health   Financial Resource Strain: Not on file  Food Insecurity: Not on file  Transportation Needs: Not on file  Physical Activity: Not on file  Stress: Not on file  Social Connections: Not  on file  Intimate Partner Violence: Not on file    Family History  Problem Relation Age of Onset  . Peptic Ulcer Disease Father      Current Outpatient Medications:  .  amLODipine (NORVASC) 5 MG tablet, Take 5 mg by mouth daily., Disp: , Rfl:  .  fluticasone (FLONASE) 50 MCG/ACT nasal spray, Place 1 spray into both nostrils daily as needed.  (Patient not taking: Reported on 11/04/2020), Disp: , Rfl:  .  meloxicam (MOBIC) 15 MG tablet, Take 1 tablet (15 mg total) by mouth daily., Disp: 90 tablet, Rfl: 0 .  montelukast (SINGULAIR) 10 MG tablet, TAKE 1 TABLET BY MOUTH EVERYDAY AT BEDTIME (Patient not taking: No sig reported), Disp: 90 tablet, Rfl: 1 .  nystatin (MYCOSTATIN) 100000 UNIT/ML suspension, Take 5 mLs (500,000 Units total) by mouth 4 (four) times daily., Disp: 60 mL, Rfl: 0 .  ondansetron (ZOFRAN-ODT) 4 MG disintegrating tablet, Take 1 tablet (4 mg total) by mouth every 6 (six) hours as needed for nausea. (Patient not taking: No sig reported), Disp: 20 tablet, Rfl: 0 .  pravastatin (PRAVACHOL) 40 MG tablet, Take 40 mg by mouth daily. , Disp: , Rfl:  .  prochlorperazine (COMPAZINE) 10 MG tablet, Take 1 tablet (10 mg total) by mouth every 6 (six) hours as needed for nausea or vomiting. (Patient not taking: No sig reported), Disp: 40 tablet, Rfl: 1 .  tamsulosin (FLOMAX) 0.4 MG CAPS capsule, Take 1 capsule (0.4 mg total) by mouth daily., Disp: 30 capsule, Rfl: 11 No current facility-administered medications for this visit.  Facility-Administered Medications Ordered in Other Visits:  .  heparin lock flush 100 unit/mL, 500 Units, Intracatheter, Once PRN, Charlaine Dalton R, MD .  sodium chloride flush (NS) 0.9 % injection 10 mL, 10 mL, Intracatheter, PRN, Cammie Sickle, MD  Physical exam: There were no vitals filed for this visit. Physical Exam Abdominal:     Palpations: Abdomen is soft.     Tenderness: There is abdominal tenderness in the epigastric area. There is guarding.   Neurological:     Mental Status: He is alert.      CMP Latest Ref Rng & Units 11/05/2020  Glucose 70 - 99 mg/dL 226(H)  BUN 8 - 23 mg/dL 9  Creatinine 0.61 - 1.24 mg/dL 0.82  Sodium 135 - 145 mmol/L 138  Potassium 3.5 - 5.1 mmol/L 3.6  Chloride 98 - 111 mmol/L 107  CO2 22 - 32 mmol/L 26  Calcium 8.9 - 10.3 mg/dL 7.8(L)  Total Protein 6.5 - 8.1 g/dL 5.8(L)  Total Bilirubin 0.3 - 1.2 mg/dL 0.6  Alkaline Phos 38 - 126 U/L 73  AST 15 - 41 U/L 32  ALT 0 - 44 U/L 19   CBC Latest Ref Rng & Units 11/05/2020  WBC 4.0 - 10.5 K/uL 5.3  Hemoglobin 13.0 - 17.0 g/dL 11.8(L)  Hematocrit 39.0 - 52.0 % 34.4(L)  Platelets 150 - 400 K/uL 162    No images are attached to the encounter.  No results found.   Assessment and plan- Patient is a 80 y.o. male who presents to [facility] for 3 days of constipation with new onset epigastric pain that radiates to his back.  He has had some nausea as well.  Metastatic colon cancer: -He is status post 9 cycles of FOLFIRI plus Avastin. - Most recent imaging from December 2021 showed mild progression of pulmonary metastasis and minimal improvement of liver lesions.  Peritoneal implant anastomosis. -CEA from 11/05/2020 is improved at 17.9.  Constipation/abdominal pain/constipation: -Given history of colon cancer and recent hemicolectomy and acute onset of symptoms, will rule out bowel obstruction. -We will get stat abdominal x-ray. -If negative will treat for constipation. -He will receive 1 L of normal saline and 8 mg Zofran while in clinic - Constipation likely secondary to taking Lomotil prior to treatment. -Has had decrease of fluid intake secondary to lack of appetite.   Disposition: -IV fluids and IV Zofran -Stat abdominal x-ray -We will call with results  Visit Diagnosis 1. Pain of upper abdomen     Patient expressed understanding and was in agreement with this plan. He also understands that He can call clinic at any time with any questions,  concerns, or complaints.   Greater than 50% was spent in counseling and coordination of care with this patient including but not limited to discussion of the relevant topics above (See A&P) including, but not limited to diagnosis and management of acute and chronic medical conditions.   Thank you for allowing me to participate in the care of this very pleasant patient.    Jacquelin Hawking, NP [place] [facility] Cell - [phone] Pager- [phone] 11/07/2020 2:40 PM

## 2020-11-07 NOTE — Telephone Encounter (Signed)
Re: abdominal x-ray results  No acute abnormality noted.   Spoke with Dr. Rogue Bussing about abdominal x-ray results.  He recommends MiraLAX 3-4 times a day till sufficient bowel movement.  If no improvement, patient is to call clinic.   If symptoms worsen, patient to go directly to the emergency room.  Faythe Casa, NP 11/07/2020 4:04 PM

## 2020-11-08 ENCOUNTER — Emergency Department: Payer: Medicare HMO

## 2020-11-08 ENCOUNTER — Emergency Department
Admission: EM | Admit: 2020-11-08 | Discharge: 2020-11-08 | Disposition: A | Discharge status: Home / Self Care | Payer: Medicare HMO | Attending: Emergency Medicine | Admitting: Emergency Medicine

## 2020-11-08 ENCOUNTER — Encounter: Payer: Self-pay | Admitting: Emergency Medicine

## 2020-11-08 ENCOUNTER — Other Ambulatory Visit: Payer: Self-pay

## 2020-11-08 DIAGNOSIS — R070 Pain in throat: Secondary | ICD-10-CM | POA: Insufficient documentation

## 2020-11-08 DIAGNOSIS — Z79899 Other long term (current) drug therapy: Secondary | ICD-10-CM | POA: Insufficient documentation

## 2020-11-08 DIAGNOSIS — Z85038 Personal history of other malignant neoplasm of large intestine: Secondary | ICD-10-CM | POA: Diagnosis not present

## 2020-11-08 DIAGNOSIS — R0602 Shortness of breath: Secondary | ICD-10-CM | POA: Diagnosis not present

## 2020-11-08 DIAGNOSIS — I10 Essential (primary) hypertension: Secondary | ICD-10-CM | POA: Diagnosis not present

## 2020-11-08 DIAGNOSIS — F17210 Nicotine dependence, cigarettes, uncomplicated: Secondary | ICD-10-CM | POA: Diagnosis not present

## 2020-11-08 DIAGNOSIS — R112 Nausea with vomiting, unspecified: Secondary | ICD-10-CM | POA: Diagnosis not present

## 2020-11-08 DIAGNOSIS — E119 Type 2 diabetes mellitus without complications: Secondary | ICD-10-CM | POA: Insufficient documentation

## 2020-11-08 DIAGNOSIS — K5900 Constipation, unspecified: Secondary | ICD-10-CM | POA: Diagnosis not present

## 2020-11-08 DIAGNOSIS — R1110 Vomiting, unspecified: Secondary | ICD-10-CM | POA: Diagnosis not present

## 2020-11-08 DIAGNOSIS — R109 Unspecified abdominal pain: Secondary | ICD-10-CM | POA: Diagnosis not present

## 2020-11-08 DIAGNOSIS — R1030 Lower abdominal pain, unspecified: Secondary | ICD-10-CM | POA: Diagnosis not present

## 2020-11-08 LAB — COMPREHENSIVE METABOLIC PANEL
ALT: 23 U/L (ref 0–44)
AST: 43 U/L — ABNORMAL HIGH (ref 15–41)
Albumin: 2.9 g/dL — ABNORMAL LOW (ref 3.5–5.0)
Alkaline Phosphatase: 93 U/L (ref 38–126)
Anion gap: 11 (ref 5–15)
BUN: 12 mg/dL (ref 8–23)
CO2: 27 mmol/L (ref 22–32)
Calcium: 8.9 mg/dL (ref 8.9–10.3)
Chloride: 100 mmol/L (ref 98–111)
Creatinine, Ser: 0.94 mg/dL (ref 0.61–1.24)
GFR, Estimated: >60 mL/min (ref >60)
Glucose, Bld: 163 mg/dL — ABNORMAL HIGH (ref 70–99)
Potassium: 4.2 mmol/L (ref 3.5–5.1)
Sodium: 138 mmol/L (ref 135–145)
Total Bilirubin: 2 mg/dL — ABNORMAL HIGH (ref 0.3–1.2)
Total Protein: 7.3 g/dL (ref 6.5–8.1)

## 2020-11-08 LAB — CBC WITH DIFFERENTIAL/PLATELET
Abs Immature Granulocytes: 0.05 10*3/uL (ref 0.00–0.07)
Basophils Absolute: 0.1 10*3/uL (ref 0.0–0.1)
Basophils Relative: 1 %
Eosinophils Absolute: 0.1 10*3/uL (ref 0.0–0.5)
Eosinophils Relative: 1 %
HCT: 42.3 % (ref 39.0–52.0)
Hemoglobin: 14.8 g/dL (ref 13.0–17.0)
Immature Granulocytes: 1 %
Lymphocytes Relative: 9 %
Lymphs Abs: 0.8 10*3/uL (ref 0.7–4.0)
MCH: 34.4 pg — ABNORMAL HIGH (ref 26.0–34.0)
MCHC: 35 g/dL (ref 30.0–36.0)
MCV: 98.4 fL (ref 80.0–100.0)
Monocytes Absolute: 0.2 10*3/uL (ref 0.1–1.0)
Monocytes Relative: 3 %
Neutro Abs: 7.4 10*3/uL (ref 1.7–7.7)
Neutrophils Relative %: 85 %
Platelets: 188 10*3/uL (ref 150–400)
RBC: 4.3 MIL/uL (ref 4.22–5.81)
RDW: 15 % (ref 11.5–15.5)
WBC: 8.5 10*3/uL (ref 4.0–10.5)
nRBC: 0 % (ref 0.0–0.2)

## 2020-11-08 LAB — URINALYSIS, COMPLETE (UACMP) WITH MICROSCOPIC
Bacteria, UA: NONE SEEN
Bilirubin Urine: NEGATIVE
Glucose, UA: NEGATIVE mg/dL
Hgb urine dipstick: NEGATIVE
Ketones, ur: NEGATIVE mg/dL
Leukocytes,Ua: NEGATIVE
Nitrite: NEGATIVE
Protein, ur: 30 mg/dL — AB
Specific Gravity, Urine: 1.017 (ref 1.005–1.030)
pH: 9 — ABNORMAL HIGH (ref 5.0–8.0)

## 2020-11-08 LAB — LIPASE, BLOOD: Lipase: 69 U/L — ABNORMAL HIGH (ref 11–51)

## 2020-11-08 LAB — GROUP A STREP BY PCR: Group A Strep by PCR: NOT DETECTED

## 2020-11-08 IMAGING — CR DG ABDOMEN 1V
2 series · 2 of 2 positions shown · non-contrast
Comparison: [DATE]

CLINICAL DATA: Constipation

EXAM:
ABDOMEN - 1 VIEW

[abdomen kub (1 of 2)]
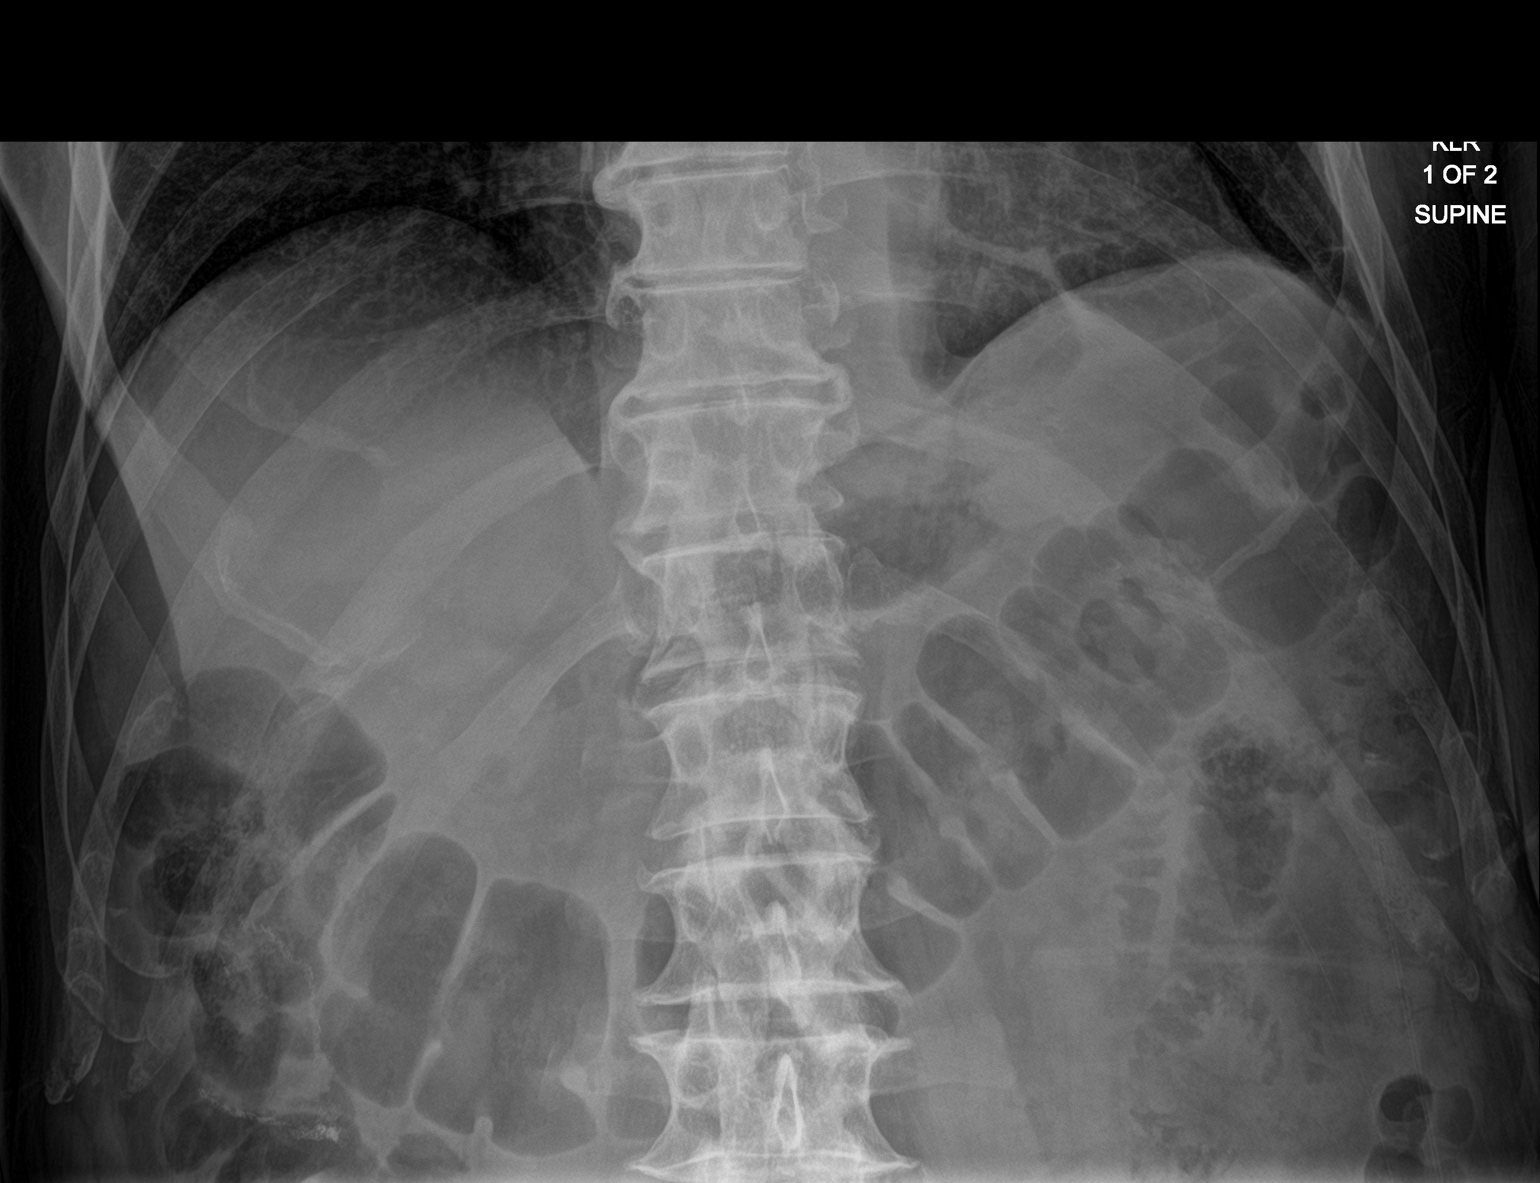

[abdomen kub (2 of 2)]
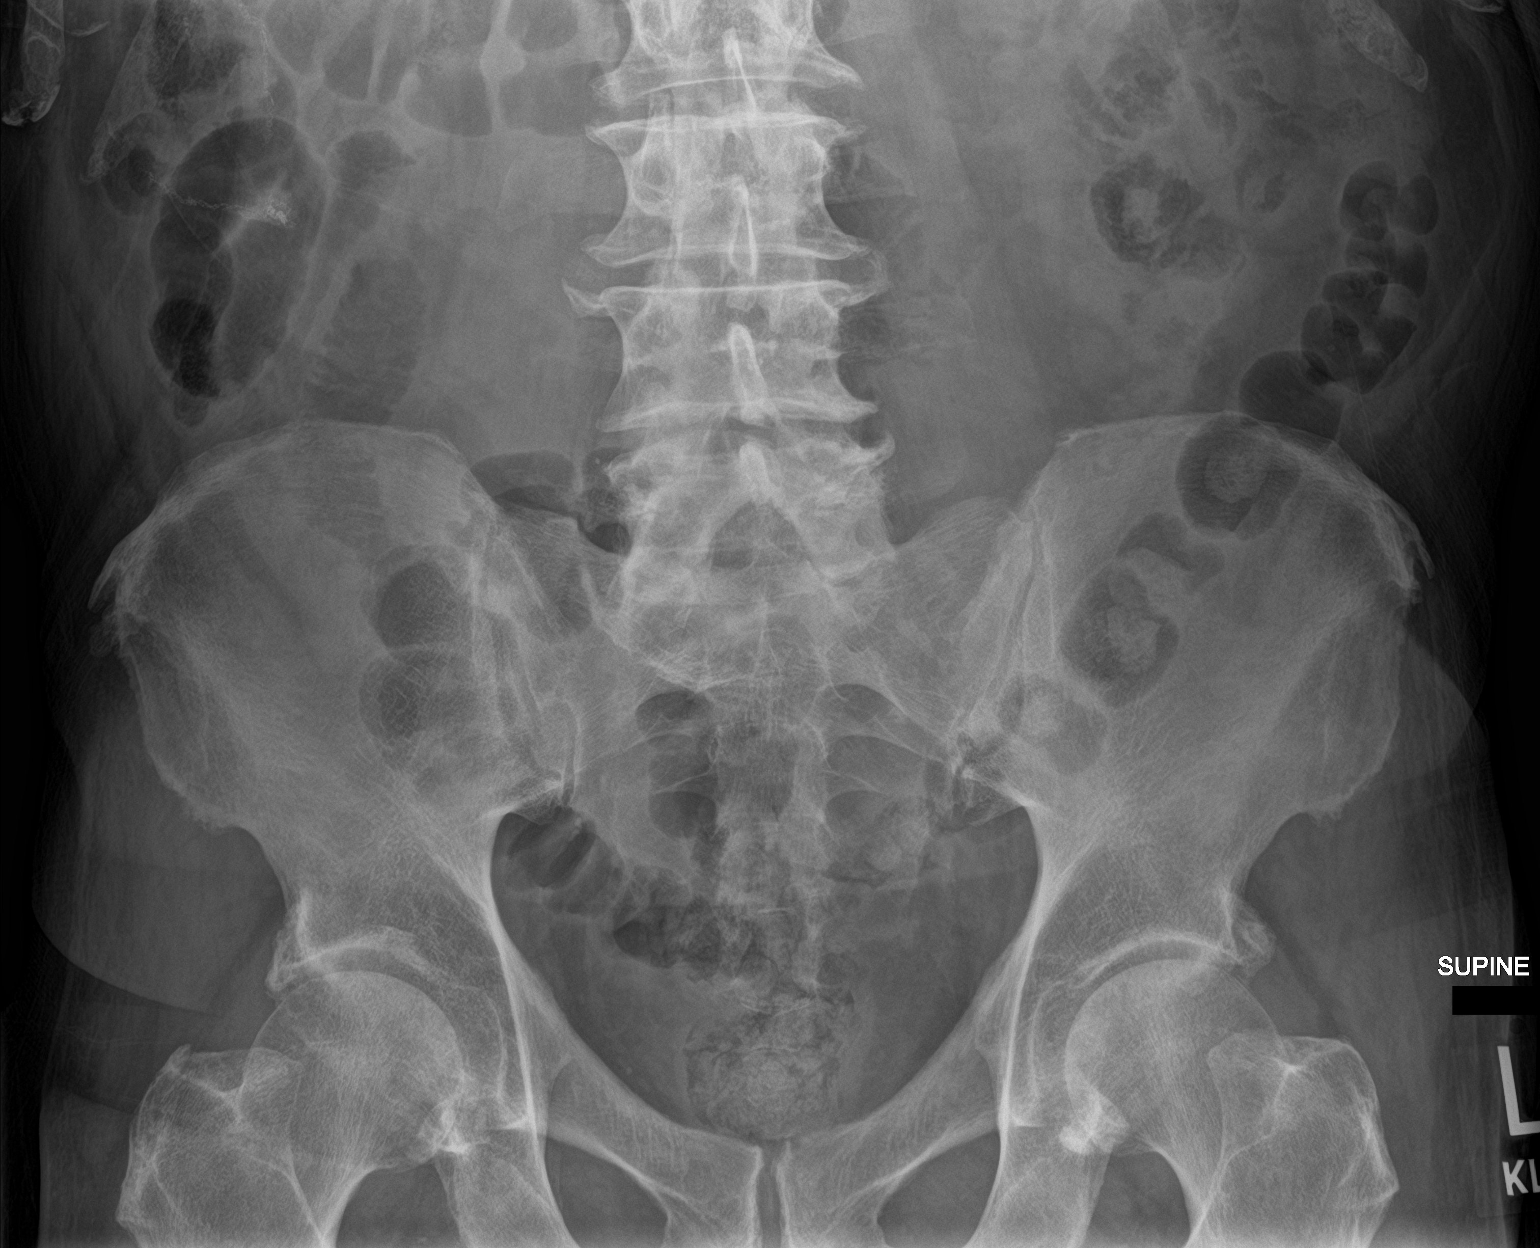

[2 of 2 positions shown; findings below may reference images not displayed]

FINDINGS: The bowel gas pattern is normal. No radio-opaque calculi or other
significant radiographic abnormality are seen. Bowel staples right
abdomen.
IMPRESSION: Negative.

## 2020-11-08 IMAGING — DX DG CHEST 1V PORT
1 series · 1 of 1 positions shown · non-contrast
Comparison: [DATE] chest CT

CLINICAL DATA: Shortness of breath.  COVID in chemotherapy patient.

EXAM:
PORTABLE CHEST 1 VIEW

[chest ap]
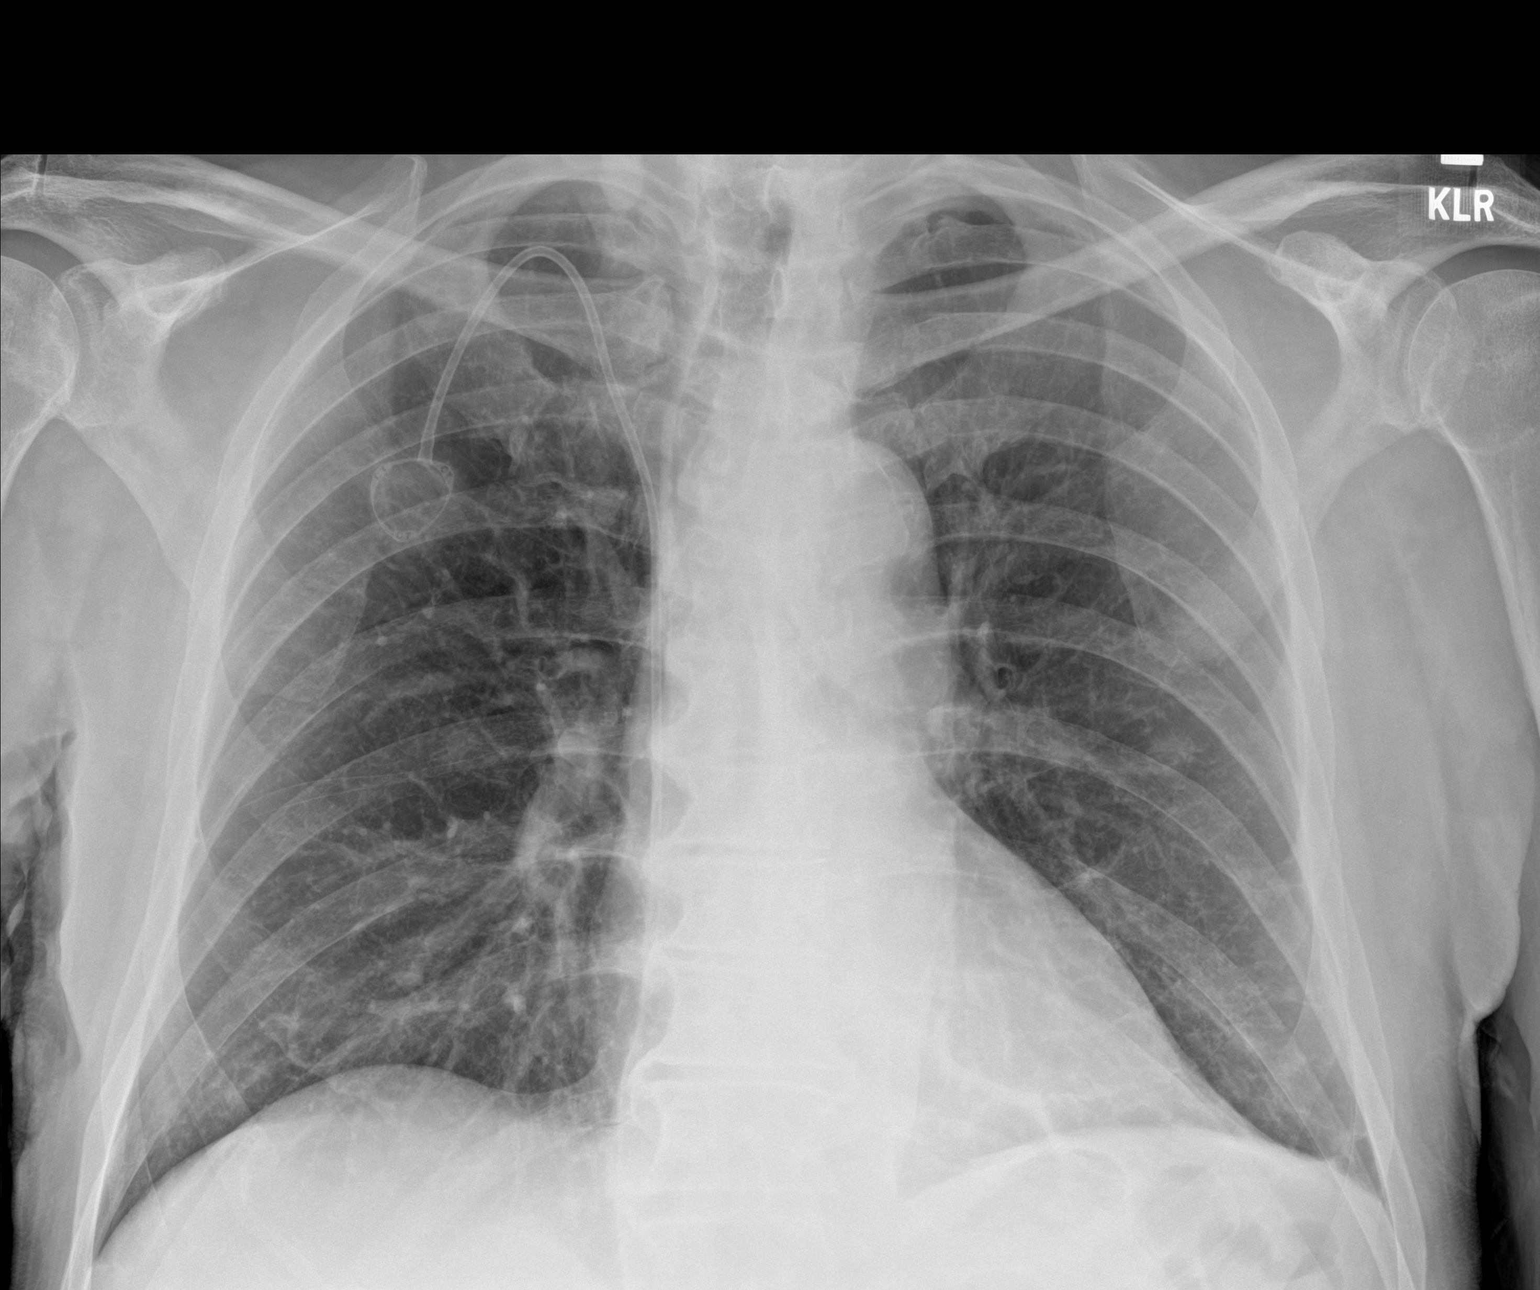

[1 of 1 positions shown; findings below may reference images not displayed]

FINDINGS: Porta catheter with tip at the upper right atrium. Normal heart size
and mediastinal contours. 13 mm nodule over the left mid lung.
Linear opacity at the left base compatible with atelectasis. There
is known pulmonary metastatic disease.
IMPRESSION: No visible pneumonia.

## 2020-11-08 IMAGING — CT CT ABD-PELV W/ CM
2 of 5 series · 15 of 46 positions shown, 17 images · IV contrast (APPLIED)
Comparison: [DATE]

CLINICAL DATA: Lower abdominal pain, nausea and vomiting, and
constipation for 4 days. Metastatic colon carcinoma, currently
undergoing chemotherapy.

EXAM:
CT ABDOMEN AND PELVIS WITH CONTRAST
TECHNIQUE: Multidetector CT imaging of the abdomen and pelvis was performed
using the standard protocol following bolus administration of
intravenous contrast.
CONTRAST:  100mL OMNIPAQUE IOHEXOL 300 MG/ML  SOLN

[Series 2: routine abd/pel with · axial · 0.86mm/px · z∈[-595,-125]mm · 12 of 104 slices shown, 14 images]
[im 5/104  soft-tissue]
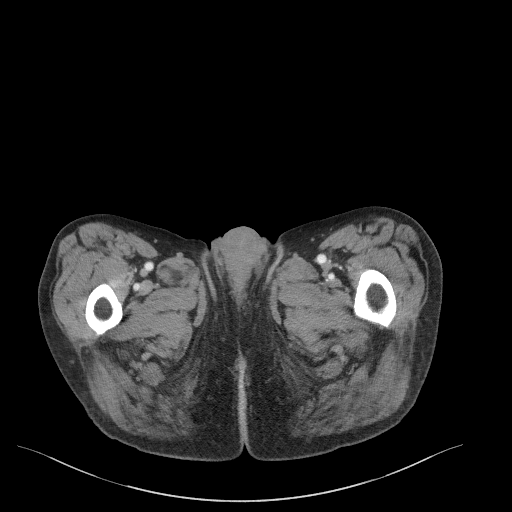
[im 5/104  bone]
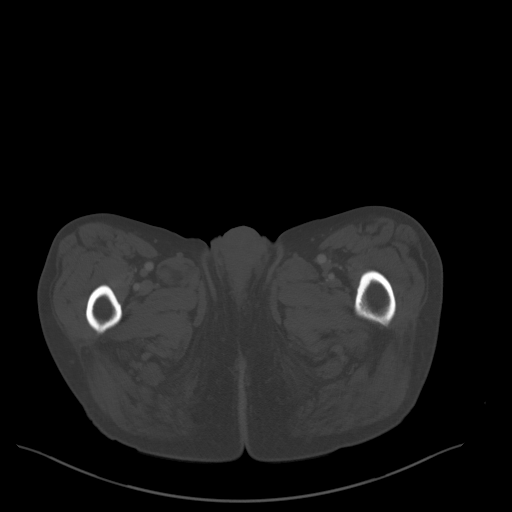
[im 15/104  soft-tissue]
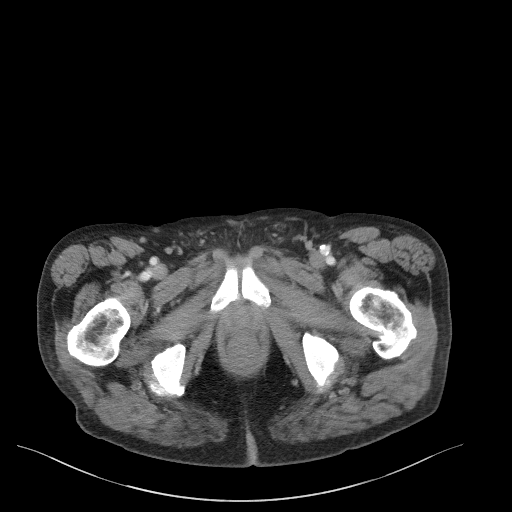
[im 25/104  soft-tissue]
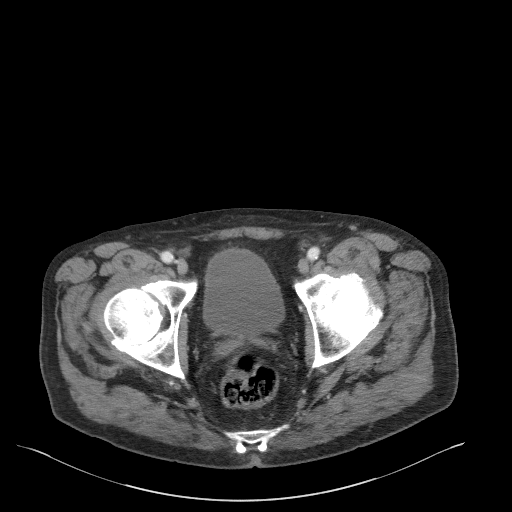
[im 30/104  soft-tissue]
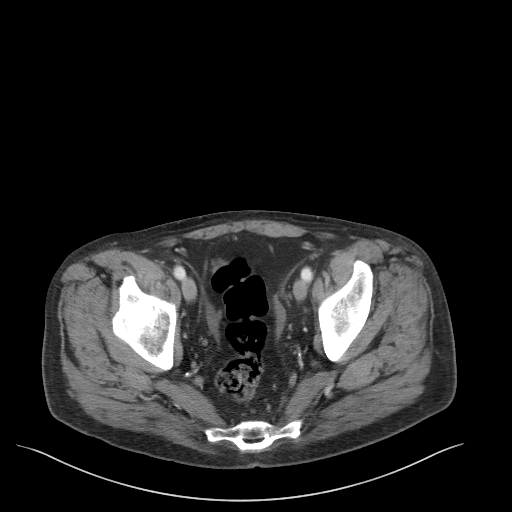
[im 40/104  soft-tissue]
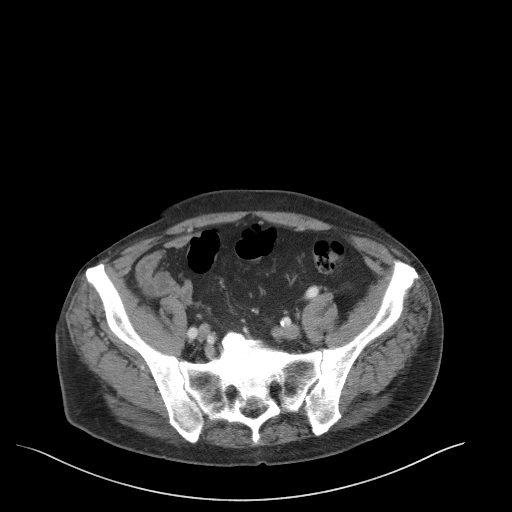
[im 50/104  soft-tissue]
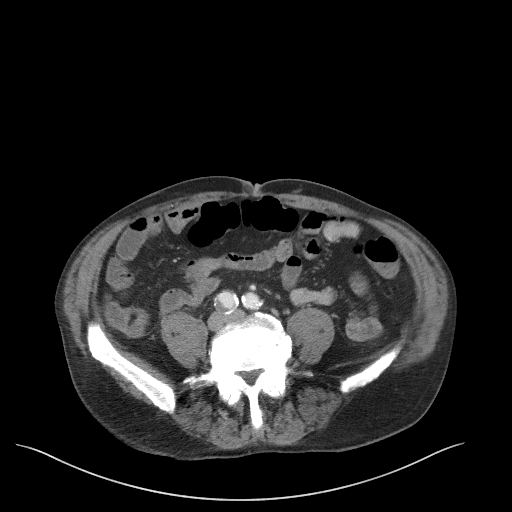
[im 54/104  soft-tissue]
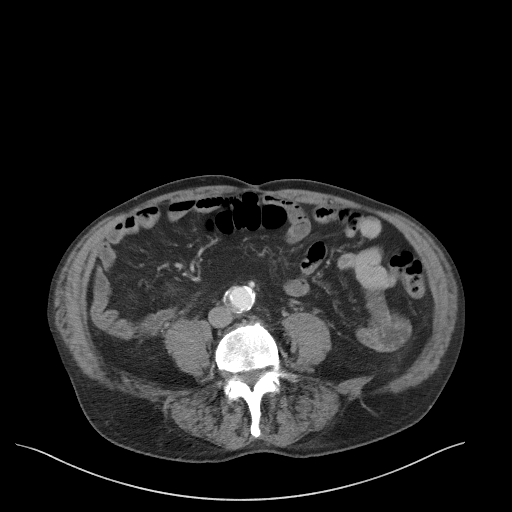
[im 64/104  soft-tissue]
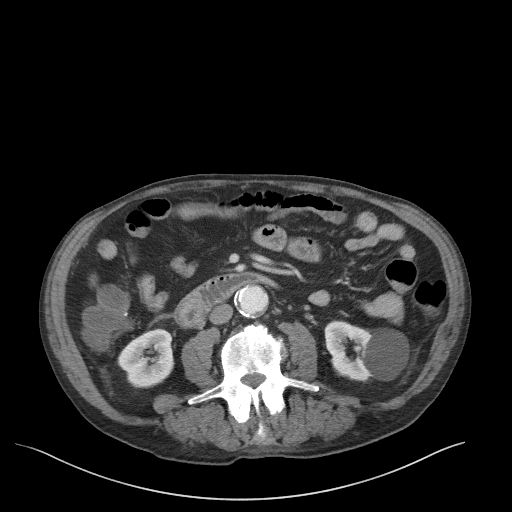
[im 74/104  soft-tissue]
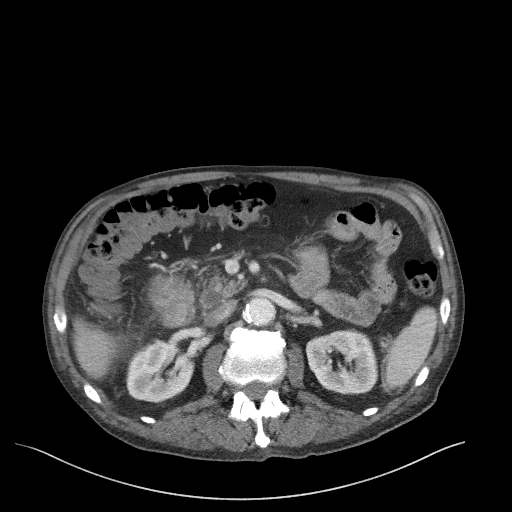
[im 74/104  bone]
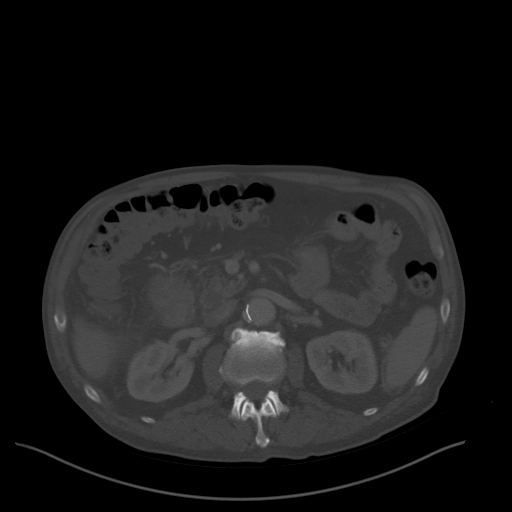
[im 79/104  soft-tissue]
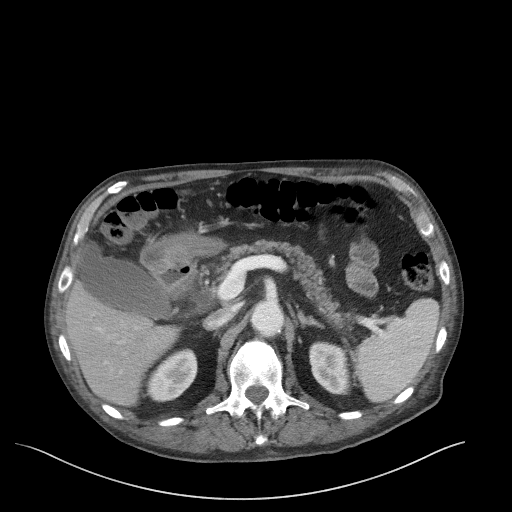
[im 89/104  soft-tissue]
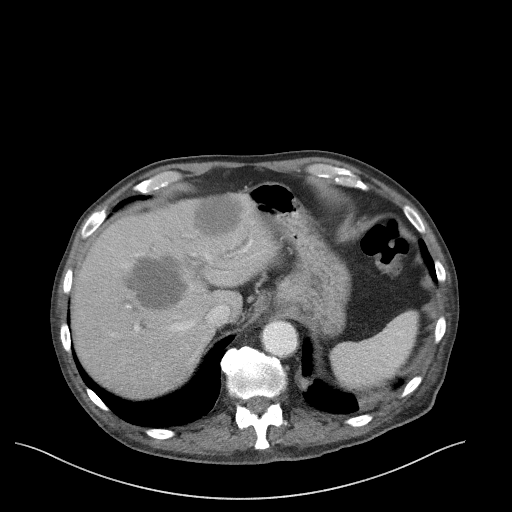
[im 99/104  soft-tissue]
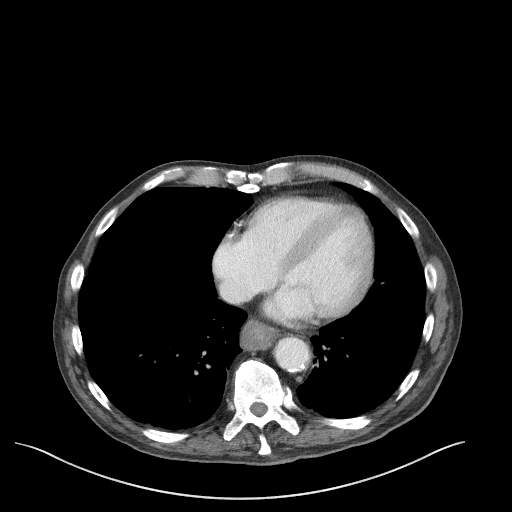

[Series 5: coronal st · coronal · 0.77mm/px · 3 of 92 slices shown]
[im 31/92  soft-tissue]
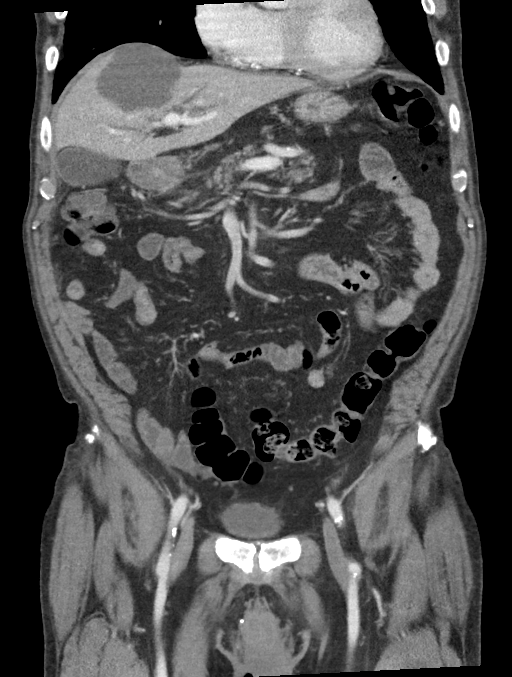
[im 41/92  soft-tissue]
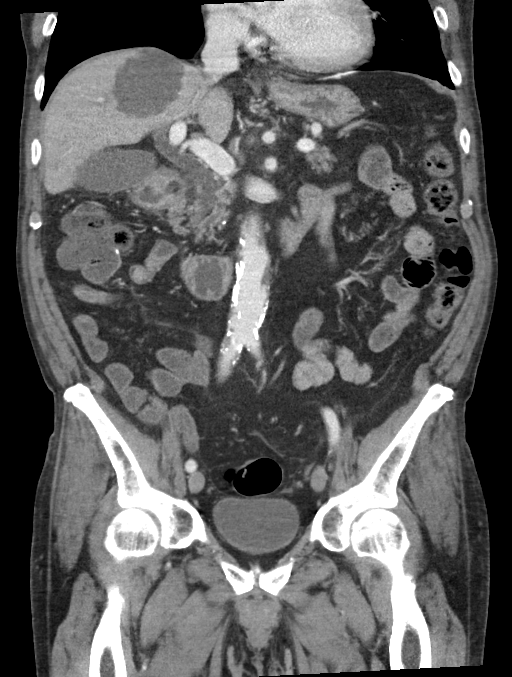
[im 51/92  soft-tissue]
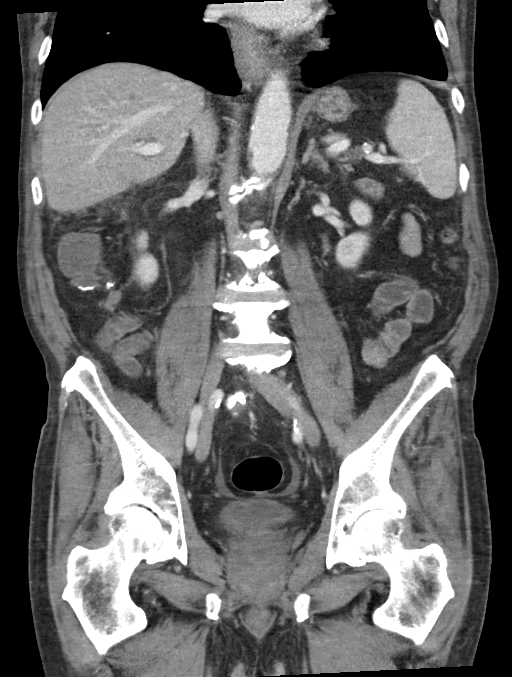

[15 of 46 positions shown; findings below may reference images not displayed]

FINDINGS: Lower Chest: New atelectasis is seen in the posteromedial left lower
lobe which partially obscures a 9 mm pulmonary nodule which was more
clearly demonstrated on recent CT. New mild patchy infiltrate is
seen in the inferior lingula. A new ill-defined nodular opacity is
seen in the posterior right lower lobe which measures 10 mm on image
[DATE], and this could represent infectious or inflammatory etiology or
metastasis.

Hepatobiliary: A hypovascular mass in the lateral segment of the
left lobe currently measures 4.5 x 4.4 cm, compared to 3.9 x 3.6 cm
previously. A smaller low-attenuation lesion in the posterior right
hepatic lobe currently measures 1.4 cm compared to 0.8 cm
previously. A large cyst in the anterior segment of the right lobe
is unchanged. Gallbladder is unremarkable. Mild diffuse biliary
ductal dilatation remains stable.

Pancreas: No mass or inflammatory changes. No evidence of pancreatic
ductal dilatation.

Spleen: Within normal limits in size and appearance.

Adrenals/Urinary Tract: No masses identified. Left lower pole renal
cyst shows no significant change. No evidence of ureteral calculi or
hydronephrosis.

Stomach/Bowel: No evidence of obstruction, inflammatory process or
abnormal fluid collections. Surgical clips again seen in the right
colon. A soft tissue nodule or lymph node is again seen adjacent to
the surgical clips, which measures 1.7 x 1.5 cm and remains stable
since previous study.

Vascular/Lymphatic: Stable 1.7 cm right pericolonic lymph node or
soft tissue density, as noted above. No other sites of
lymphadenopathy identified. No abdominal aortic aneurysm. Aortic
atherosclerotic calcification noted.

Reproductive:  No mass or other significant abnormality.

Other:  None.

Musculoskeletal:  No suspicious bone lesions identified.
IMPRESSION: Mild progression of liver metastases since prior exam.

Stable 1.7 cm soft tissue nodule or lymph node adjacent to surgical
clips in right colon.

New atelectasis in posteromedial left lower lobe, which obscures a 9
mm pulmonary metastasis in this region on recent CT. New 1 cm
ill-defined posterior right lower lobe nodule is nonspecific, and
could represent infectious or inflammatory etiology or metastasis.

## 2020-11-08 MED ORDER — IOHEXOL 300 MG/ML  SOLN
100.0000 mL | Freq: Once | INTRAMUSCULAR | Status: AC | PRN
  Administered 2020-11-08: 100 mL via INTRAVENOUS
  Filled 2020-11-08: qty 100

## 2020-11-08 MED ORDER — SODIUM CHLORIDE 0.9 % IV BOLUS
500.0000 mL | Freq: Once | INTRAVENOUS | Status: AC
  Administered 2020-11-08: 500 mL via INTRAVENOUS

## 2020-11-08 MED ORDER — ONDANSETRON HCL 4 MG/2ML IJ SOLN
4.0000 mg | Freq: Once | INTRAMUSCULAR | Status: AC
  Administered 2020-11-08: 4 mg via INTRAVENOUS
  Filled 2020-11-08: qty 2

## 2020-11-08 MED ORDER — MORPHINE SULFATE (PF) 4 MG/ML IV SOLN
4.0000 mg | Freq: Once | INTRAVENOUS | Status: AC
  Administered 2020-11-08: 4 mg via INTRAVENOUS
  Filled 2020-11-08: qty 1

## 2020-11-08 MED ORDER — TRAMADOL HCL 50 MG PO TABS: 50.0000 mg | ORAL_TABLET | Freq: Four times a day (QID) | ORAL | 0 refills | Status: AC | PRN

## 2020-11-08 MED ORDER — ONDANSETRON 8 MG PO TBDP: 8.0000 mg | ORAL_TABLET | Freq: Three times a day (TID) | ORAL | 0 refills | Status: DC | PRN

## 2020-11-08 NOTE — ED Notes (Signed)
Pt cleared to have po intake. Pt given ice water at this time

## 2020-11-08 NOTE — ED Provider Notes (Signed)
[facility] Emergency Department Provider Note ____________________________________________   Event Date/Time   First MD Initiated Contact with Patient 11/08/20 [phone]     (approximate)  I have reviewed the triage vital signs and the nursing notes.   HISTORY  Chief Complaint Abdominal Pain    HPI Oscar [name] is a 80 y.o. male with PMH as noted below including metastatic colon cancer status post treatment with FOLFIRI who presents with constipation and abdominal pain.  The patient states that he has not had a bowel movement in the last 4 days.  He developed abdominal pain early yesterday and describes it as persistent but crampy.  It is associated with nausea and vomiting since yesterday.  He took Dulcolax and MiraLAX with no relief.  He has no associated fever, chest pain, shortness of breath, or urinary symptoms.  He does report a sore throat.  Past Medical History:  Diagnosis Date   Arthritis    Cancer ([place])    colon cancer 02/2019 per pt    Diabetes mellitus without complication ([place])    H/O colon cancer, stage IV    Hyperlipemia    Hypertension     Patient Active Problem List   Diagnosis Date Noted   Goals of care, counseling/discussion 07/13/2019   Cancer of right colon ([place]) 07/05/2019   Acute appendicitis with appendiceal abscess 03/04/2019    Past Surgical History:  Procedure Laterality Date   IR IMAGING GUIDED PORT INSERTION  07/20/2019   JOINT REPLACEMENT      Prior to Admission medications   Medication Sig Start Date End Date Taking? Authorizing Provider  ondansetron (ZOFRAN ODT) 8 MG disintegrating tablet Take 1 tablet (8 mg total) by mouth every 8 (eight) hours as needed for nausea or vomiting. 11/08/20  Yes Arta Silence, MD  traMADol (ULTRAM) 50 MG tablet Take 1 tablet (50 mg total) by mouth every 6 (six) hours as needed for up to 5 days for severe pain. 11/08/20 11/13/20 Yes Arta Silence, MD  amLODipine  (NORVASC) 5 MG tablet Take 5 mg by mouth daily.    [provider]  meloxicam (MOBIC) 15 MG tablet Take 1 tablet (15 mg total) by mouth daily. 09/30/20   Cammie Sickle, MD  nystatin (MYCOSTATIN) 100000 UNIT/ML suspension Take 5 mLs (500,000 Units total) by mouth 4 (four) times daily. 11/05/20   Cammie Sickle, MD  pravastatin (PRAVACHOL) 40 MG tablet Take 40 mg by mouth daily.     [provider]  tamsulosin (FLOMAX) 0.4 MG CAPS capsule Take 1 capsule (0.4 mg total) by mouth daily. 03/15/20 03/15/21  Borders, Kirt Boys, NP    Allergies Ace inhibitors  Family History  Problem Relation Age of Onset   Peptic Ulcer Disease Father     Social History Social History   Tobacco Use   Smoking status: Current Every Day Smoker    Packs/day: 0.25    Types: Cigarettes   Smokeless tobacco: Never Used   Tobacco comment: 1 to 2 cigarettes a day occasionally  Vaping Use   Vaping Use: Never used  Substance Use Topics   Alcohol use: No   Drug use: No    Review of Systems  Constitutional: No fever. Eyes: No redness. ENT: No sore throat. Cardiovascular: Denies chest pain. Respiratory: Denies shortness of breath. Gastrointestinal: Positive for nausea and vomiting. Genitourinary: Negative for dysuria.  Musculoskeletal: Negative for back pain. Skin: Negative for rash. Neurological: Negative for headache.   ____________________________________________   PHYSICAL  EXAM:  VITAL SIGNS: ED Triage Vitals  Enc Vitals Group     BP 11/08/20 0656 110/82     Pulse Rate 11/08/20 0656 91     Resp 11/08/20 0656 18     Temp 11/08/20 0656 98.1 F (36.7 C)     Temp Source 11/08/20 0656 Oral     SpO2 11/08/20 0656 99 %     Weight 11/08/20 0657 187 lb (84.8 kg)     Height 11/08/20 0657 6' (1.829 m)     Head Circumference --      Peak Flow --      Pain Score 11/08/20 0657 7     Pain Loc --      Pain Edu? --      Excl. in [place]? --     Constitutional: Alert and  oriented.  Slightly uncomfortable appearing but in no acute distress. Eyes: Conjunctivae are normal.  No scleral icterus. Head: Atraumatic. Nose: No congestion/rhinnorhea. Mouth/Throat: Mucous membranes are dry.   Neck: Normal range of motion.  Cardiovascular: Normal rate, regular rhythm. Grossly normal heart sounds.  Good peripheral circulation. Respiratory: Normal respiratory effort.  No retractions. Lungs CTAB. Gastrointestinal: Soft with mild bilateral lower quadrant tenderness.  No distention.  Genitourinary: No flank tenderness. Musculoskeletal: Extremities warm and well perfused.  Neurologic:  Normal speech and language. No gross focal neurologic deficits are appreciated.  Skin:  Skin is warm and dry. No rash noted. Psychiatric: Mood and affect are normal. Speech and behavior are normal.  ____________________________________________   LABS (all labs ordered are listed, but only abnormal results are displayed)  Labs Reviewed  CBC WITH DIFFERENTIAL/PLATELET - Abnormal; Notable for the following components:      Result Value   MCH 34.4 (*)    All other components within normal limits  COMPREHENSIVE METABOLIC PANEL - Abnormal; Notable for the following components:   Glucose, Bld 163 (*)    Albumin 2.9 (*)    AST 43 (*)    Total Bilirubin 2.0 (*)    All other components within normal limits  LIPASE, BLOOD - Abnormal; Notable for the following components:   Lipase 69 (*)    All other components within normal limits  URINALYSIS, COMPLETE (UACMP) WITH MICROSCOPIC - Abnormal; Notable for the following components:   Color, Urine AMBER (*)    APPearance HAZY (*)    pH 9.0 (*)    Protein, ur 30 (*)    All other components within normal limits  GROUP A STREP BY PCR   ____________________________________________  EKG    ____________________________________________  RADIOLOGY  Chest x-ray interpreted by me shows no focal infiltrate or edema XR abdomen interpreted by me  shows a normal bowel gas pattern with no distended bowel loops or evidence of SBO CT abdomen/pelvis: Possible increased liver metastases with no acute intra-abdominal abnormality.  ____________________________________________   PROCEDURES  Procedure(s) performed: No  Procedures  Critical Care performed: No ____________________________________________   INITIAL IMPRESSION / ASSESSMENT AND PLAN / ED COURSE  Pertinent labs & imaging results that were available during my care of the patient were reviewed by me and considered in my medical decision making (see chart for details).  80 year old male with PMH as noted above including metastatic colon cancer currently followed at the cancer center presents with constipation for 4 days along with lower abdominal pain and vomiting since yesterday.  He has also had some sore throat.  I reviewed the past medical records in Epic; the patient was seen at  the cancer center for the symptoms yesterday.  He had an abdominal x-ray which was unremarkable and received fluids and Zofran.  Constipation was suspected to be secondary to taking Lomotil prior to his cancer treatment.  On exam, the patient is somewhat uncomfortable but not acutely ill-appearing.  His abdomen is soft with mild bilateral lower quadrant tenderness but no significant distention.  Mucous membranes are slightly dry.  Throat appears erythematous with no exudates.  The exam is otherwise unremarkable.  Differential includes gastritis, gastroenteritis, colitis, diverticulitis, pancreatitis or other hepatobiliary etiology, bowel obstruction, volvulus, or possible simple constipation although I would not expect active vomiting secondary to simple constipation.  There is no evidence of cardiac etiology given the lower abdominal location of the pain.  Initial lab work-up reveals minimal elevations in the lipase and total bilirubin, but otherwise no significant finding.  X-rays are unremarkable.   We will obtain a CT for further evaluation.  ----------------------------------------- 1:29 PM on 11/08/2020 -----------------------------------------  CT showed no acute findings other than possible increased liver metastases and a lung nodule.  The patient appeared much more comfortable afterwards and was tolerating p.o.  After several hours, he continued to have no recurrent vomiting or severe pain.  Overall I suspect gastritis or other benign etiology.  This could also be related to recent COVID, or metastatic disease.  The patient feels comfortable.  I advised that if he was not tolerating p.o. and continues to feel weak, we could consider admission.  However, he states he feels significantly better and would prefer to go home.  Given that he is tolerating p.o., I think this is reasonable.  I counseled him on the results of the work-up.  I will prescribe Zofran for symptomatic treatment as well as a small quantity of tramadol for pain.  Return precautions given, and he expresses understanding.  ____________________________________________   FINAL CLINICAL IMPRESSION(S) / ED DIAGNOSES  Final diagnoses:  Non-intractable vomiting with nausea, unspecified vomiting type  Lower abdominal pain      NEW MEDICATIONS STARTED DURING THIS VISIT:  Discharge Medication List as of 11/08/2020  1:33 PM    START taking these medications   Details  ondansetron (ZOFRAN ODT) 8 MG disintegrating tablet Take 1 tablet (8 mg total) by mouth every 8 (eight) hours as needed for nausea or vomiting., Starting Fri 11/08/2020, Normal    traMADol (ULTRAM) 50 MG tablet Take 1 tablet (50 mg total) by mouth every 6 (six) hours as needed for up to 5 days for severe pain., Starting Fri 11/08/2020, Until Wed 11/13/2020 at 2359, Normal         Note:  This document was prepared using Dragon voice recognition software and may include unintentional dictation errors.    Arta Silence, MD 11/08/20 [phone]

## 2020-11-08 NOTE — Discharge Instructions (Addendum)
You may continue using the MiraLAX for constipation.  Take the Zofran (ondansetron) as needed for nausea.  You should take over-the-counter Tylenol for pain, with the tramadol reserved for more severe pain if needed.  Drink small sips of water frequently, avoid any heavy foods for the next few days, gradual advance your diet.  Follow-up with your primary care and cancer doctors.  Return to the ER for new, worsening, or persistent severe pain, nausea or vomiting, a distended or swollen abdomen, inability to eat or drink, fever, weakness, blood in the stool, or any other new or worsening symptoms that concern you.

## 2020-11-08 NOTE — ED Triage Notes (Addendum)
Patient ambulatory to triage with steady gait, without difficulty or distress noted; pt reports no BM x 4 days accomp by lower abd pain, N/V; st constipation unrelieved by dulcolax and miralax; pt currently receiving chemo for liver CA (has portacath in place); tested +COVID 2wks ago; also c/o sore throat this am but denies any fever/cough/congestion

## 2020-11-09 ENCOUNTER — Emergency Department: Payer: Medicare HMO

## 2020-11-09 ENCOUNTER — Other Ambulatory Visit: Payer: Self-pay

## 2020-11-09 ENCOUNTER — Emergency Department
Admission: EM | Admit: 2020-11-09 | Discharge: 2020-11-09 | Disposition: A | Discharge status: Home / Self Care | Payer: Medicare HMO | Attending: Emergency Medicine | Admitting: Emergency Medicine

## 2020-11-09 ENCOUNTER — Encounter: Payer: Self-pay | Admitting: Emergency Medicine

## 2020-11-09 DIAGNOSIS — R531 Weakness: Secondary | ICD-10-CM | POA: Insufficient documentation

## 2020-11-09 DIAGNOSIS — R0902 Hypoxemia: Secondary | ICD-10-CM | POA: Diagnosis not present

## 2020-11-09 DIAGNOSIS — Z5321 Procedure and treatment not carried out due to patient leaving prior to being seen by health care provider: Secondary | ICD-10-CM | POA: Insufficient documentation

## 2020-11-09 DIAGNOSIS — R52 Pain, unspecified: Secondary | ICD-10-CM | POA: Diagnosis not present

## 2020-11-09 DIAGNOSIS — R1030 Lower abdominal pain, unspecified: Secondary | ICD-10-CM | POA: Insufficient documentation

## 2020-11-09 DIAGNOSIS — R29810 Facial weakness: Secondary | ICD-10-CM | POA: Diagnosis not present

## 2020-11-09 DIAGNOSIS — R42 Dizziness and giddiness: Secondary | ICD-10-CM | POA: Diagnosis not present

## 2020-11-09 IMAGING — CT CT HEAD W/O CM
3 series · 14 of 47 positions shown, 16 images · non-contrast
Comparison: Head CT [DATE].

CLINICAL DATA: 79-year-old male with history of dizziness.

EXAM:
CT HEAD WITHOUT CONTRAST
TECHNIQUE: Contiguous axial images were obtained from the base of the skull
through the vertex without intravenous contrast.

[Series 3: ax head wo · axial · 0.35mm/px · z∈[+260,+389]mm · 8 of 32 slices shown, 10 images]
[im 3/32  brain]
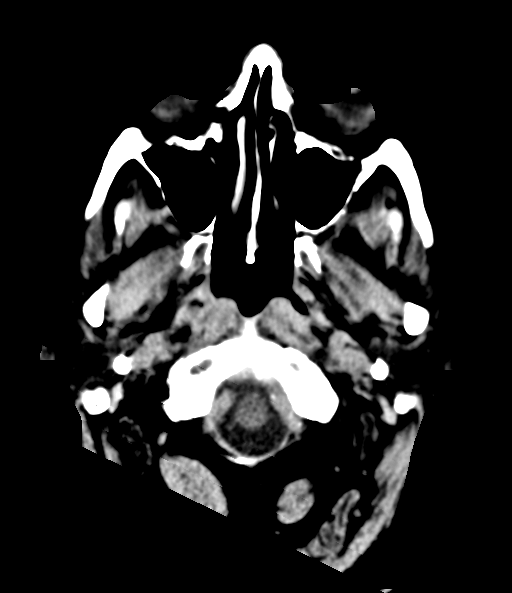
[im 3/32  bone]
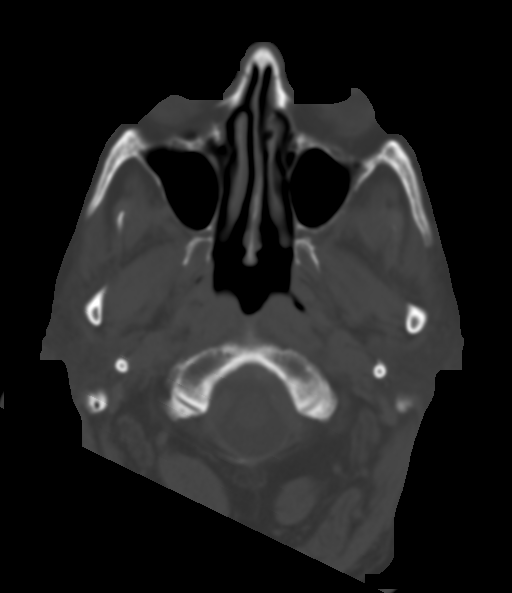
[im 7/32  brain]
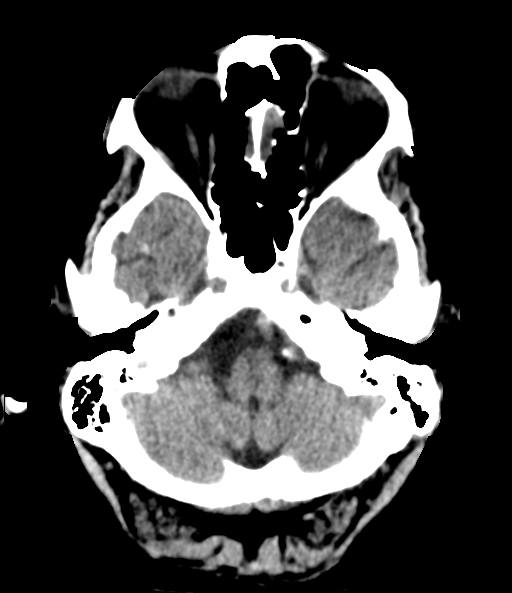
[im 10/32  brain]
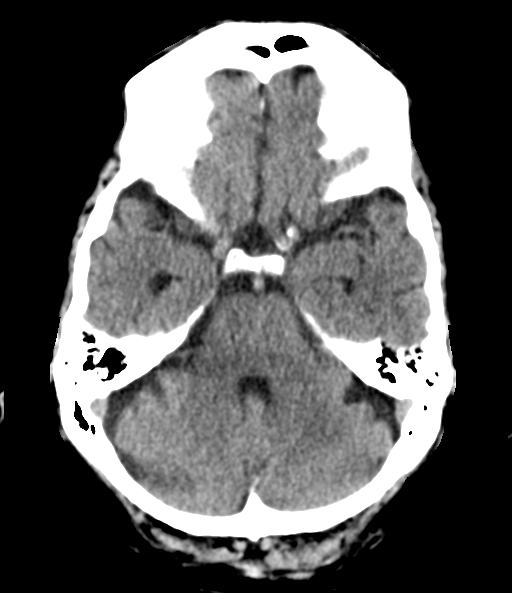
[im 14/32  brain]
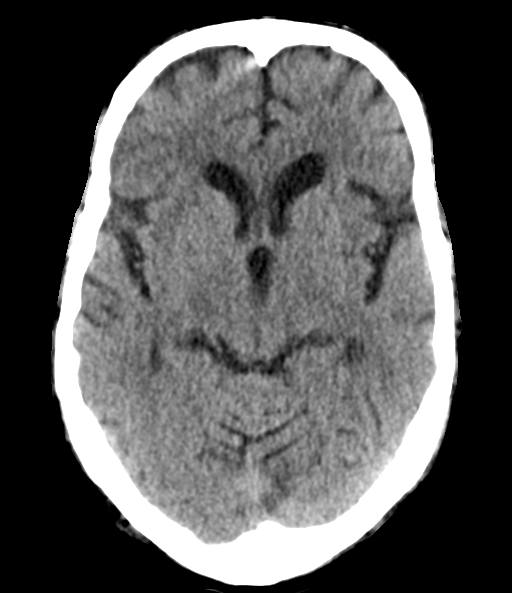
[im 18/32  brain]
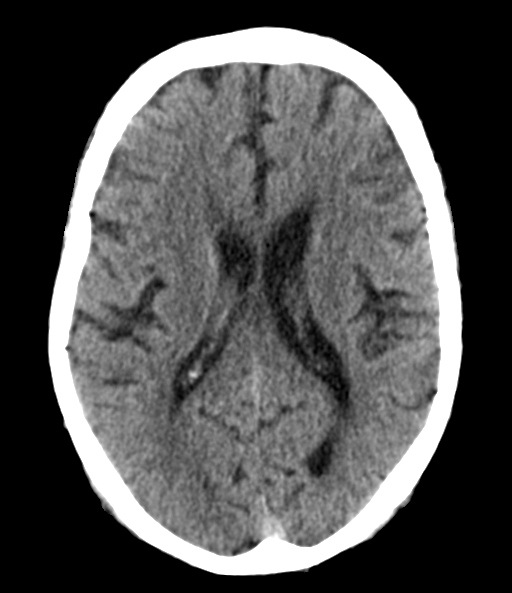
[im 18/32  bone]
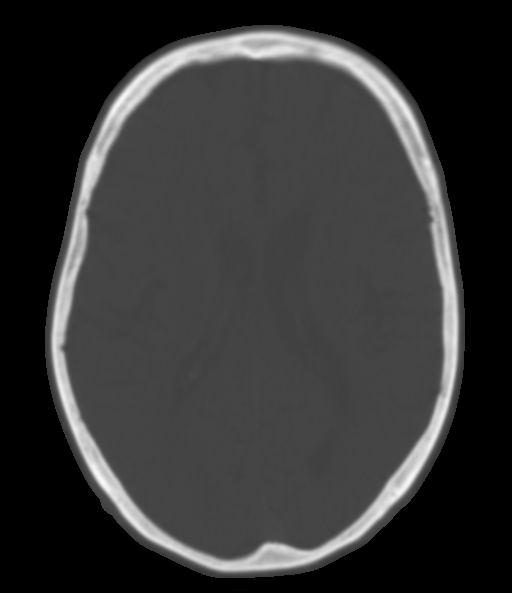
[im 22/32  brain]
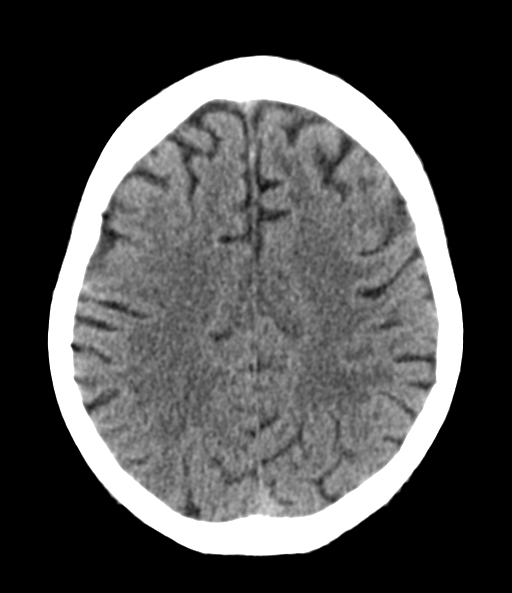
[im 25/32  brain]
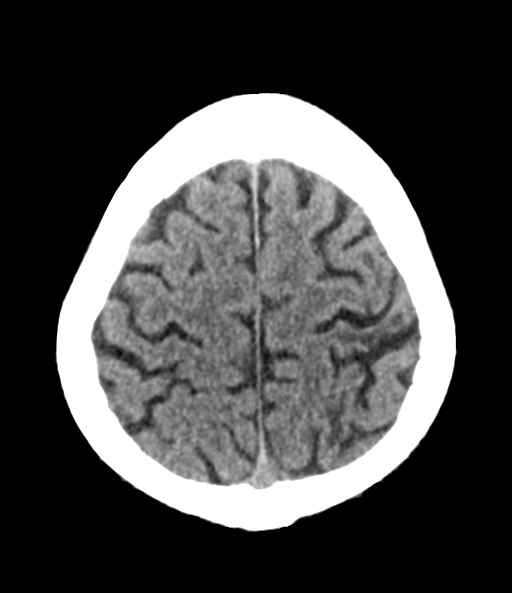
[im 29/32  brain]
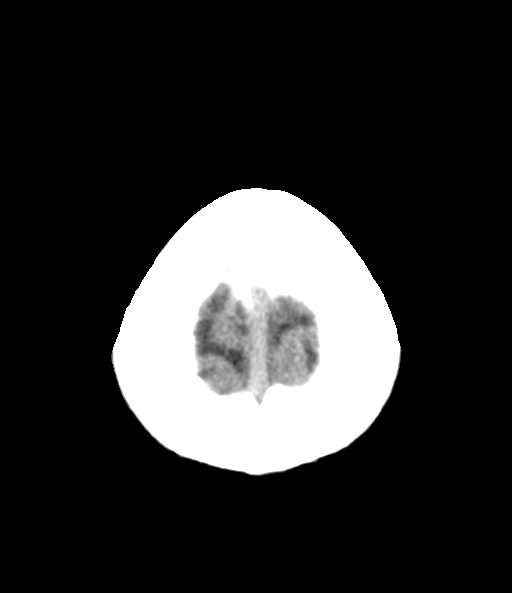

[Series 4: coronal soft tissue · coronal · 0.32mm/px · 3 of 66 slices shown]
[im 22/66  brain]
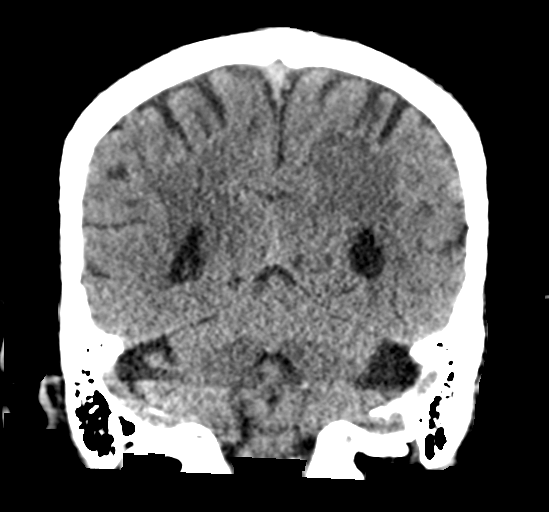
[im 29/66  brain]
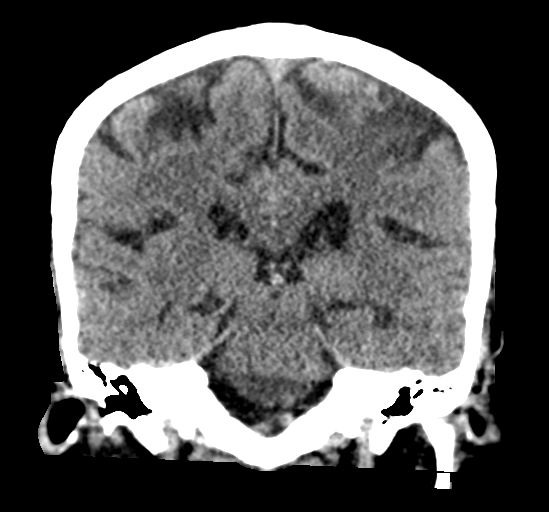
[im 37/66  brain]
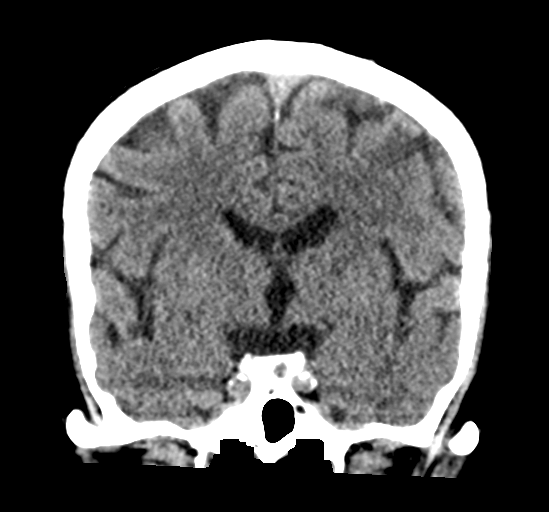

[Series 5: sagittal soft tissue · sagittal · 0.31mm/px · 3 of 51 slices shown]
[im 17/51  brain]
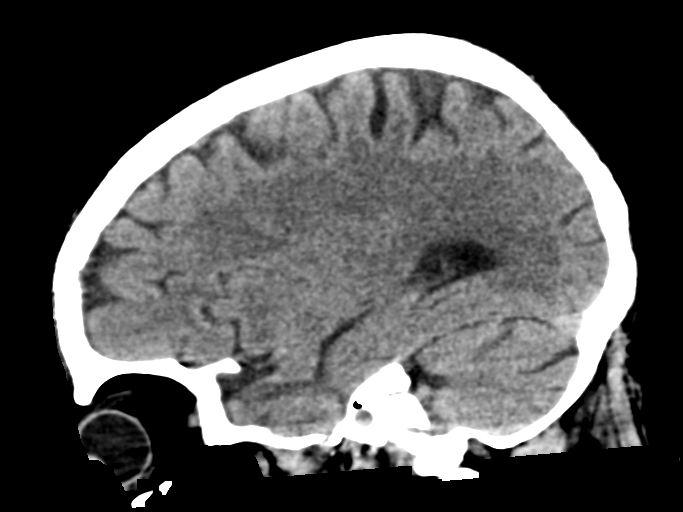
[im 26/51  brain]
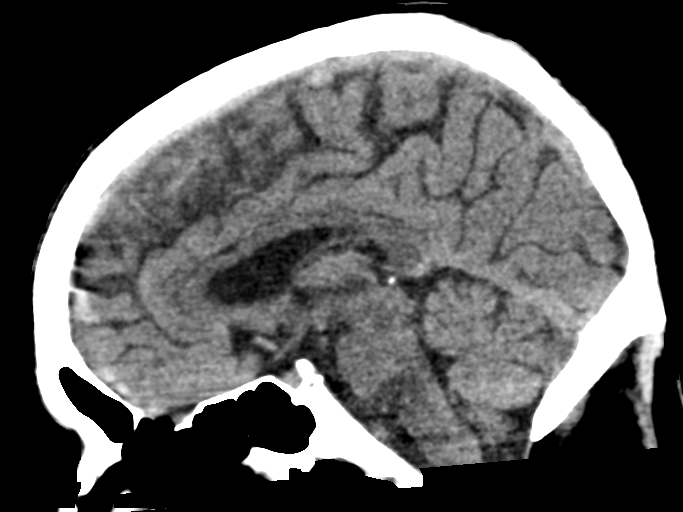
[im 34/51  brain]
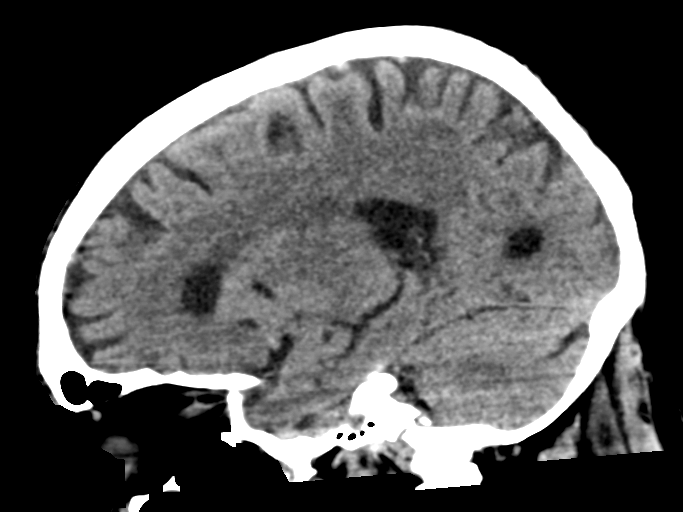

[14 of 47 positions shown; findings below may reference images not displayed]

FINDINGS: Brain: Mild cerebral atrophy. Patchy and confluent areas of
decreased attenuation are noted throughout the deep and
periventricular white matter of the cerebral hemispheres
bilaterally, compatible with chronic microvascular ischemic disease.
Well-defined areas of low attenuation are noted in the basal ganglia
bilaterally, most evident in the head of the left caudate nucleus,
indicative of old lacunar infarcts. No evidence of acute infarction,
hemorrhage, hydrocephalus, extra-axial collection or mass
lesion/mass effect.

Vascular: No hyperdense vessel or unexpected calcification.

Skull: Normal. Negative for fracture or focal lesion.

Sinuses/Orbits: No acute finding.

Other: None.
IMPRESSION: 1. No acute intracranial abnormalities.
2. Mild cerebral atrophy with chronic microvascular ischemic changes
in the cerebral white matter and old lacunar infarcts in the basal
ganglia bilaterally, as above.

## 2020-11-09 NOTE — ED Triage Notes (Addendum)
Pt via EMS from home. Pt c/o lower abdominal pain since Tuesday 01/18. Pt states he was also constipated and hasn't had a BM since Monday. Pt was seen yesterday and the day before in the ED. Pt had bloodwork, X-ray and CT of his abdomen done yesterday. Per family member, pt's speech was slurred and pt had weakness in his arms. LKW 1030. On assessment, pt having very mild expressive aphasia. No weakness in arms or legs and denies numbness or tingling in his extremities. Pt is A&Ox4 and NAD.

## 2020-11-18 ENCOUNTER — Other Ambulatory Visit: Payer: Self-pay

## 2020-11-18 ENCOUNTER — Inpatient Hospital Stay: Payer: Medicare HMO

## 2020-11-18 ENCOUNTER — Telehealth: Payer: Self-pay | Admitting: *Deleted

## 2020-11-18 ENCOUNTER — Ambulatory Visit
Admission: RE | Admit: 2020-11-18 | Discharge: 2020-11-18 | Disposition: A | Discharge status: Home / Self Care | Payer: Medicare HMO | Source: Ambulatory Visit | Attending: Nurse Practitioner | Admitting: Nurse Practitioner

## 2020-11-18 ENCOUNTER — Inpatient Hospital Stay (HOSPITAL_BASED_OUTPATIENT_CLINIC_OR_DEPARTMENT_OTHER): Payer: Medicare HMO | Admitting: Nurse Practitioner

## 2020-11-18 VITALS — BP 131/79 | HR 66 | Temp 99.0°F | Resp 18

## 2020-11-18 DIAGNOSIS — C182 Malignant neoplasm of ascending colon: Secondary | ICD-10-CM

## 2020-11-18 DIAGNOSIS — C787 Secondary malignant neoplasm of liver and intrahepatic bile duct: Secondary | ICD-10-CM | POA: Diagnosis not present

## 2020-11-18 DIAGNOSIS — R1030 Lower abdominal pain, unspecified: Secondary | ICD-10-CM | POA: Diagnosis not present

## 2020-11-18 DIAGNOSIS — R109 Unspecified abdominal pain: Secondary | ICD-10-CM | POA: Diagnosis not present

## 2020-11-18 DIAGNOSIS — K5900 Constipation, unspecified: Secondary | ICD-10-CM | POA: Diagnosis not present

## 2020-11-18 DIAGNOSIS — Z7189 Other specified counseling: Secondary | ICD-10-CM

## 2020-11-18 DIAGNOSIS — R1010 Upper abdominal pain, unspecified: Secondary | ICD-10-CM

## 2020-11-18 DIAGNOSIS — Z5112 Encounter for antineoplastic immunotherapy: Secondary | ICD-10-CM | POA: Diagnosis not present

## 2020-11-18 DIAGNOSIS — C7800 Secondary malignant neoplasm of unspecified lung: Secondary | ICD-10-CM | POA: Diagnosis not present

## 2020-11-18 DIAGNOSIS — C20 Malignant neoplasm of rectum: Secondary | ICD-10-CM | POA: Diagnosis not present

## 2020-11-18 DIAGNOSIS — Z5111 Encounter for antineoplastic chemotherapy: Secondary | ICD-10-CM | POA: Diagnosis not present

## 2020-11-18 LAB — CBC WITH DIFFERENTIAL/PLATELET
Abs Immature Granulocytes: 0.01 10*3/uL (ref 0.00–0.07)
Basophils Absolute: 0 10*3/uL (ref 0.0–0.1)
Basophils Relative: 1 %
Eosinophils Absolute: 0.5 10*3/uL (ref 0.0–0.5)
Eosinophils Relative: 12 %
HCT: 34.8 % — ABNORMAL LOW (ref 39.0–52.0)
Hemoglobin: 11.9 g/dL — ABNORMAL LOW (ref 13.0–17.0)
Immature Granulocytes: 0 %
Lymphocytes Relative: 15 %
Lymphs Abs: 0.7 10*3/uL (ref 0.7–4.0)
MCH: 34.3 pg — ABNORMAL HIGH (ref 26.0–34.0)
MCHC: 34.2 g/dL (ref 30.0–36.0)
MCV: 100.3 fL — ABNORMAL HIGH (ref 80.0–100.0)
Monocytes Absolute: 0.6 10*3/uL (ref 0.1–1.0)
Monocytes Relative: 12 %
Neutro Abs: 2.8 10*3/uL (ref 1.7–7.7)
Neutrophils Relative %: 60 %
Platelets: 164 10*3/uL (ref 150–400)
RBC: 3.47 MIL/uL — ABNORMAL LOW (ref 4.22–5.81)
RDW: 15.9 % — ABNORMAL HIGH (ref 11.5–15.5)
WBC: 4.7 10*3/uL (ref 4.0–10.5)
nRBC: 0 % (ref 0.0–0.2)

## 2020-11-18 LAB — COMPREHENSIVE METABOLIC PANEL
ALT: 16 U/L (ref 0–44)
AST: 28 U/L (ref 15–41)
Albumin: 2.6 g/dL — ABNORMAL LOW (ref 3.5–5.0)
Alkaline Phosphatase: 107 U/L (ref 38–126)
Anion gap: 6 (ref 5–15)
BUN: 10 mg/dL (ref 8–23)
CO2: 23 mmol/L (ref 22–32)
Calcium: 8 mg/dL — ABNORMAL LOW (ref 8.9–10.3)
Chloride: 104 mmol/L (ref 98–111)
Creatinine, Ser: 0.93 mg/dL (ref 0.61–1.24)
GFR, Estimated: >60 mL/min (ref >60)
Glucose, Bld: 165 mg/dL — ABNORMAL HIGH (ref 70–99)
Potassium: 3.8 mmol/L (ref 3.5–5.1)
Sodium: 133 mmol/L — ABNORMAL LOW (ref 135–145)
Total Bilirubin: 0.9 mg/dL (ref 0.3–1.2)
Total Protein: 6.5 g/dL (ref 6.5–8.1)

## 2020-11-18 MED ORDER — SODIUM CHLORIDE 0.9 % IV SOLN
INTRAVENOUS | Status: AC
  Filled 2020-11-18 (×2): qty 250

## 2020-11-18 MED ORDER — HEPARIN SOD (PORK) LOCK FLUSH 100 UNIT/ML IV SOLN
500.0000 [IU] | Freq: Once | INTRAVENOUS | Status: AC
  Administered 2020-11-18: 500 [IU] via INTRAVENOUS
  Filled 2020-11-18: qty 5

## 2020-11-18 MED ORDER — SODIUM CHLORIDE 0.9% FLUSH
10.0000 mL | Freq: Once | INTRAVENOUS | Status: AC
  Administered 2020-11-18: 10 mL via INTRAVENOUS
  Filled 2020-11-18: qty 10

## 2020-11-18 NOTE — Telephone Encounter (Signed)
Daughter said to call her phone with appointment time

## 2020-11-18 NOTE — Telephone Encounter (Signed)
Sure. Labs (cbc, cmp), [facility]. Thanks!

## 2020-11-18 NOTE — Telephone Encounter (Signed)
Maryann called requesting that patient come in to be seen today with abdominal pain that has not resolved. She reports that he is having bowel movement and that she thinks that he is having diarrhea now as he had to change his pants yesterday. She states that the pain is just above his navel and that he has been taking Tramadol of which he is now out of for the pain. She also reports that he had an episode of stroke like symptoms last week for which she called EMS and he went to ER, but was not seen after several hours and he left. He had weakness and facial drooping on one side. Please advise.

## 2020-11-18 NOTE — Telephone Encounter (Signed)
Patient's daughter, Marvia Pickles, called and given appointment times for labs and [facility] today. Verbalized understanding.

## 2020-11-18 NOTE — Progress Notes (Signed)
[facility] [place]  [phone] [phone]  Patient Care Team: Sofie Hartigan, MD as PCP - General (Family Medicine) [name], MD as Consulting Physician (Hematology and Oncology)   Name of the patient: Oscar [name]  [id number]  06-16-1941   Date of visit: 11/18/20  Diagnosis-colon cancer  Chief complaint/ Reason for visit-abdominal pain and constipation  Heme/Onc history:  Oncology History Overview Note  # MAY 2020- 3. 03/09/19 Liver biopsy. Microscopic examination shows malignant cells with glandular architecture consistent with adenocarcinoma. The malignant cells are positive for CK20 and CDX-2. These findings support the clinical impression of metastatic colon adenocarcinoma. 4. 03/10/19 R hemicolectomy. Tumor site cecum. Adenocarcinoma. Mucinous features present. G2. No tumor deposits. Invades visceral peritoneum. No tumor perforation. LVI present. PNI not identified. All margins uninvolved. 1/12 LNs. PT4apN1. Periappendiceal inflammation c/w resolving abscess. Microsatellite stable (MSS). [Dr.Mettu; [facility]]  # SEP 4th 2020 [compared to May 2020]  Interval increase in size of the metastases to the hepatic dome, The metastasis to the left hepatic lobe is unchanged; 2.  New subcentimeter hypoattenuating lesion in the inferior right hepatic lobe, incompletely characterized on CT. 3.  Postsurgical changes following right hemicolectomy.  # OCT 2020- FOLFOX +avastin; CT dec 22nd 2020- [compared to [facility] sep 9th 2020]-Liver- slight progression versus stable disease; CT scan SEP 4th 2021- Progressive disease-left lower lobe lung nodule 8 mm [previously 4 mm]; increase in size of the hepatic metastases by few millimeters.;  Soft tissue nodule adjacent to anastomotic site again increased by few millimeters.STOP FOLFOX; cont avastin  #  SEP 20th, 2021- FOLFIRI+ AVASTIN  # JAN 2022- COVID [s/p Mab infusion; pills; skin rash  resolved.]  # NGS/F-ONE-MUTATED K-RAS [G]  # PALLIATIVE CARE EVALUATION: 09/20/2019-[name]  # PAIN MANAGEMENT: NA   DIAGNOSIS: COLON CANCER  STAGE:  IV     ;  GOALS:Palliative  CURRENT/MOST RECENT THERAPY : FOLFIRI+ avastin [C]    Cancer of right colon ([place])  07/05/2019 Initial Diagnosis   Cancer of right colon ([place])   07/24/2019 - 06/25/2020 Chemotherapy   The patient had dexamethasone (DECADRON) 4 MG tablet, 8 mg, Oral, Daily, 1 of 1 cycle, Start date: --, End date: -- palonosetron (ALOXI) injection 0.25 mg, 0.25 mg, Intravenous,  Once, 23 of 25 cycles Administration: 0.25 mg (07/24/2019), 0.25 mg (08/07/2019), 0.25 mg (08/21/2019), 0.25 mg (09/04/2019), 0.25 mg (09/20/2019), 0.25 mg (10/04/2019), 0.25 mg (10/16/2019), 0.25 mg (10/30/2019), 0.25 mg (11/22/2019), 0.25 mg (12/06/2019), 0.25 mg (12/20/2019), 0.25 mg (01/03/2020), 0.25 mg (01/29/2020), 0.25 mg (02/12/2020), 0.25 mg (02/26/2020), 0.25 mg (03/11/2020), 0.25 mg (03/25/2020), 0.25 mg (04/08/2020), 0.25 mg (04/24/2020), 0.25 mg (05/08/2020), 0.25 mg (05/22/2020), 0.25 mg (06/10/2020), 0.25 mg (06/25/2020) leucovorin 800 mg in dextrose 5 % 250 mL infusion, 844 mg, Intravenous,  Once, 23 of 25 cycles Administration: 800 mg (07/24/2019), 800 mg (08/07/2019), 800 mg (08/21/2019), 800 mg (09/04/2019), 800 mg (09/20/2019), 800 mg (10/04/2019), 800 mg (10/16/2019), 800 mg (10/30/2019), 800 mg (11/22/2019), 800 mg (12/06/2019), 800 mg (12/20/2019), 800 mg (01/03/2020), 800 mg (01/29/2020), 800 mg (02/12/2020), 800 mg (02/26/2020), 800 mg (03/11/2020), 800 mg (03/25/2020), 800 mg (04/08/2020), 800 mg (04/24/2020), 800 mg (05/08/2020), 800 mg (05/22/2020), 800 mg (06/10/2020), 800 mg (06/25/2020) oxaliplatin (ELOXATIN) 180 mg in dextrose 5 % 500 mL chemo infusion, 85 mg/m2 = 180 mg, Intravenous,  Once, 23 of 25 cycles Dose modification: 178 mg (original dose 85 mg/m2, Cycle 17, Reason: Other (see comments), Comment: insurance adjusted dose ) Administration: 180 mg (07/24/2019),  180 mg  (08/07/2019), 180 mg (08/21/2019), 180 mg (09/04/2019), 180 mg (09/20/2019), 180 mg (10/04/2019), 180 mg (10/16/2019), 180 mg (10/30/2019), 180 mg (11/22/2019), 180 mg (12/06/2019), 180 mg (12/20/2019), 180 mg (01/03/2020), 180 mg (01/29/2020), 180 mg (02/12/2020), 180 mg (02/26/2020), 180 mg (03/11/2020), 180 mg (04/08/2020), 180 mg (04/24/2020), 180 mg (05/08/2020), 180 mg (05/22/2020), 180 mg (06/10/2020), 180 mg (06/25/2020) fluorouracil (ADRUCIL) 5,000 mg in sodium chloride 0.9 % 150 mL chemo infusion, 5,050 mg, Intravenous, 1 Day/Dose, 23 of 25 cycles Administration: 5,000 mg (07/24/2019), 5,000 mg (08/07/2019), 5,000 mg (08/21/2019), 5,050 mg (09/04/2019), 5,000 mg (10/04/2019), 5,000 mg (10/16/2019), 5,000 mg (11/22/2019), 5,000 mg (12/06/2019), 5,000 mg (12/20/2019), 5,000 mg (01/03/2020), 5,000 mg (01/29/2020), 5,000 mg (02/12/2020), 5,000 mg (02/26/2020), 5,000 mg (03/11/2020), 5,000 mg (03/25/2020), 5,000 mg (04/08/2020), 5,000 mg (04/24/2020), 5,000 mg (05/08/2020), 5,000 mg (05/22/2020), 5,000 mg (06/10/2020), 5,000 mg (06/25/2020) bevacizumab-bvzr (ZIRABEV) 400 mg in sodium chloride 0.9 % 100 mL chemo infusion, 5 mg/kg = 400 mg, Intravenous,  Once, 23 of 25 cycles Administration: 400 mg (08/07/2019), 400 mg (08/21/2019), 400 mg (09/04/2019), 400 mg (09/20/2019), 400 mg (10/04/2019), 400 mg (10/16/2019), 400 mg (10/30/2019), 400 mg (11/22/2019), 400 mg (12/06/2019), 400 mg (12/20/2019), 400 mg (01/03/2020), 400 mg (01/29/2020), 400 mg (02/12/2020), 400 mg (02/26/2020), 400 mg (03/11/2020), 400 mg (03/25/2020), 400 mg (04/08/2020), 400 mg (04/24/2020), 400 mg (05/08/2020), 400 mg (05/22/2020), 400 mg (06/10/2020), 400 mg (06/25/2020)  for chemotherapy treatment.    07/08/2020 -  Chemotherapy    Patient is on Treatment Plan: COLORECTAL FOLFIRI / BEVACIZUMAB Q14D      11/05/2020 Cancer Staging   Staging form: Colon and Rectum, AJCC 8th Edition - Clinical: Stage IVC (pM1c) - Signed by Cammie Sickle, MD on 11/05/2020     Interval history-patient is  80 year old male with past medical history of colon cancer status post 9 cycles FOLFIRI plus avastin chemotherapy who presents for abdominal pain.  He was seen in ER on 11/08/2020 complaining of constipation by 4 days and abdominal pain with associated nausea and vomiting.  Imaging was negative for small bowel obstruction.  Possible increased liver mets.  Symptoms thought to be secondary to gastritis and side effect of medications and constipation. Since that time, he says that he had 1 bowel movement, did not feel empty, and has had loose stool but no solid bowel movement since that time.  Still feels constipated.  Abdominal pain is crampy and located in the lower abdomen.  No nausea or vomiting.  Oral intake reduced due to symptoms.  Has minimize tramadol.  No fevers or illness.  Has taken MiraLAX daily  Review of systems- Review of Systems  Constitutional: Positive for malaise/fatigue. Negative for chills, fever and weight loss.  HENT: Negative for hearing loss, nosebleeds, sore throat and tinnitus.   Eyes: Negative for blurred vision and double vision.  Respiratory: Negative for cough, hemoptysis, shortness of breath and wheezing.   Cardiovascular: Negative for chest pain, palpitations and leg swelling.  Gastrointestinal: Positive for abdominal pain, constipation and diarrhea. Negative for blood in stool, melena, nausea and vomiting.  Genitourinary: Negative for dysuria and urgency.  Musculoskeletal: Negative for back pain, falls, joint pain and myalgias.  Skin: Negative for itching and rash.  Neurological: Negative for dizziness, tingling, sensory change, loss of consciousness, weakness and headaches.  Endo/Heme/Allergies: Negative for environmental allergies. Does not bruise/bleed easily.  Psychiatric/Behavioral: Negative for depression. The patient is not nervous/anxious and does not have insomnia.       Allergies  Allergen  Reactions  . Ace Inhibitors Swelling    Past Medical History:   Diagnosis Date  . Arthritis   . Cancer ([place])    colon cancer 02/2019 per pt   . Diabetes mellitus without complication ([place])   . H/O colon cancer, stage IV   . Hyperlipemia   . Hypertension     Past Surgical History:  Procedure Laterality Date  . IR IMAGING GUIDED PORT INSERTION  07/20/2019  . JOINT REPLACEMENT      Social History   Socioeconomic History  . Marital status: Married    Spouse name: Not on file  . Number of children: Not on file  . Years of education: Not on file  . Highest education level: Not on file  Occupational History  . Not on file  Tobacco Use  . Smoking status: Current Every Day Smoker    Packs/day: 0.25    Types: Cigarettes  . Smokeless tobacco: Never Used  . Tobacco comment: 1 to 2 cigarettes a day occasionally  Vaping Use  . Vaping Use: Never used  Substance and Sexual Activity  . Alcohol use: No  . Drug use: No  . Sexual activity: Not on file  Other Topics Concern  . Not on file  Social History Narrative   Recruitment consultant retd; lives in [place]; smoking 3cig/day; [3/4 ppd x started at 9 years]; no alcohol. Son & daughter; wife dementia [waiting for placement].    Social Determinants of Health   Financial Resource Strain: Not on file  Food Insecurity: Not on file  Transportation Needs: Not on file  Physical Activity: Not on file  Stress: Not on file  Social Connections: Not on file  Intimate Partner Violence: Not on file    Family History  Problem Relation Age of Onset  . Peptic Ulcer Disease Father      Current Outpatient Medications:  .  amLODipine (NORVASC) 5 MG tablet, Take 5 mg by mouth daily., Disp: , Rfl:  .  meloxicam (MOBIC) 15 MG tablet, Take 1 tablet (15 mg total) by mouth daily., Disp: 90 tablet, Rfl: 0 .  nystatin (MYCOSTATIN) 100000 UNIT/ML suspension, Take 5 mLs (500,000 Units total) by mouth 4 (four) times daily., Disp: 60 mL, Rfl: 0 .  ondansetron (ZOFRAN ODT) 8 MG disintegrating tablet, Take 1  tablet (8 mg total) by mouth every 8 (eight) hours as needed for nausea or vomiting., Disp: 12 tablet, Rfl: 0 .  pravastatin (PRAVACHOL) 40 MG tablet, Take 40 mg by mouth daily. , Disp: , Rfl:  .  tamsulosin (FLOMAX) 0.4 MG CAPS capsule, Take 1 capsule (0.4 mg total) by mouth daily., Disp: 30 capsule, Rfl: 11  Physical exam:  Vitals:   11/18/20 1413  BP: 131/79  Pulse: 66  Resp: 18  Temp: 99 F (37.2 C)  TempSrc: Tympanic  SpO2: 100%   Physical Exam Vitals reviewed.  Constitutional:      General: He is not in acute distress.    Appearance: He is well-developed and well-nourished.  HENT:     Head: Normocephalic and atraumatic.     Nose: Nose normal.     Mouth/Throat:     Mouth: Oropharynx is clear and moist.     Pharynx: No oropharyngeal exudate.  Eyes:     General: No scleral icterus.    Extraocular Movements: EOM normal.     Conjunctiva/sclera: Conjunctivae normal.     Pupils: Pupils are equal, round, and reactive to light.  Cardiovascular:     Rate  and Rhythm: Normal rate and regular rhythm.     Heart sounds: Normal heart sounds.  Pulmonary:     Effort: Pulmonary effort is normal.     Breath sounds: Normal breath sounds. No wheezing.  Abdominal:     General: Bowel sounds are normal. There is no distension.     Palpations: Abdomen is soft.     Tenderness: There is abdominal tenderness in the right lower quadrant, suprapubic area and left lower quadrant. There is no guarding or rebound.  Musculoskeletal:        General: No edema. Normal range of motion.     Cervical back: Normal range of motion and neck supple.  Skin:    General: Skin is warm and dry.  Neurological:     Mental Status: He is alert and oriented to person, place, and time.  Psychiatric:        Mood and Affect: Mood and affect normal.        Behavior: Behavior normal.      CMP Latest Ref Rng & Units 11/19/2020  Glucose 70 - 99 mg/dL 199(H)  BUN 8 - 23 mg/dL 8  Creatinine 0.61 - 1.24 mg/dL 0.97   Sodium 135 - 145 mmol/L 138  Potassium 3.5 - 5.1 mmol/L 3.8  Chloride 98 - 111 mmol/L 106  CO2 22 - 32 mmol/L 23  Calcium 8.9 - 10.3 mg/dL 8.2(L)  Total Protein 6.5 - 8.1 g/dL 6.2(L)  Total Bilirubin 0.3 - 1.2 mg/dL 0.6  Alkaline Phos 38 - 126 U/L 107  AST 15 - 41 U/L 31  ALT 0 - 44 U/L 14   CBC Latest Ref Rng & Units 11/18/2020  WBC 4.0 - 10.5 K/uL 4.7  Hemoglobin 13.0 - 17.0 g/dL 11.9(L)  Hematocrit 39.0 - 52.0 % 34.8(L)  Platelets 150 - 400 K/uL 164    No images are attached to the encounter.  DG Abdomen 1 View  Result Date: 11/08/2020 CLINICAL DATA:  Constipation EXAM: ABDOMEN - 1 VIEW COMPARISON:  11/07/2020 FINDINGS: The bowel gas pattern is normal. No radio-opaque calculi or other significant radiographic abnormality are seen. Bowel staples right abdomen. IMPRESSION: Negative. Electronically Signed   By: Franchot Gallo M.D.   On: 11/08/2020 07:59   CT HEAD WO CONTRAST  Result Date: 11/09/2020 CLINICAL DATA:  80 year old male with history of dizziness. EXAM: CT HEAD WITHOUT CONTRAST TECHNIQUE: Contiguous axial images were obtained from the base of the skull through the vertex without intravenous contrast. COMPARISON:  Head CT 03/06/2016. FINDINGS: Brain: Mild cerebral atrophy. Patchy and confluent areas of decreased attenuation are noted throughout the deep and periventricular white matter of the cerebral hemispheres bilaterally, compatible with chronic microvascular ischemic disease. Well-defined areas of low attenuation are noted in the basal ganglia bilaterally, most evident in the head of the left caudate nucleus, indicative of old lacunar infarcts. No evidence of acute infarction, hemorrhage, hydrocephalus, extra-axial collection or mass lesion/mass effect. Vascular: No hyperdense vessel or unexpected calcification. Skull: Normal. Negative for fracture or focal lesion. Sinuses/Orbits: No acute finding. Other: None. IMPRESSION: 1. No acute intracranial abnormalities. 2. Mild  cerebral atrophy with chronic microvascular ischemic changes in the cerebral white matter and old lacunar infarcts in the basal ganglia bilaterally, as above. Electronically Signed   By: Vinnie Langton M.D.   On: 11/09/2020 13:36   CT ABDOMEN PELVIS W CONTRAST  Result Date: 11/08/2020 CLINICAL DATA:  Lower abdominal pain, nausea and vomiting, and constipation for 4 days. Metastatic colon carcinoma, currently undergoing chemotherapy.  EXAM: CT ABDOMEN AND PELVIS WITH CONTRAST TECHNIQUE: Multidetector CT imaging of the abdomen and pelvis was performed using the standard protocol following bolus administration of intravenous contrast. CONTRAST:  122mL OMNIPAQUE IOHEXOL 300 MG/ML  SOLN COMPARISON:  09/24/2020 FINDINGS: Lower Chest: New atelectasis is seen in the posteromedial left lower lobe which partially obscures a 9 mm pulmonary nodule which was more clearly demonstrated on recent CT. New mild patchy infiltrate is seen in the inferior lingula. A new ill-defined nodular opacity is seen in the posterior right lower lobe which measures 10 mm on image 4/4, and this could represent infectious or inflammatory etiology or metastasis. Hepatobiliary: A hypovascular mass in the lateral segment of the left lobe currently measures 4.5 x 4.4 cm, compared to 3.9 x 3.6 cm previously. A smaller low-attenuation lesion in the posterior right hepatic lobe currently measures 1.4 cm compared to 0.8 cm previously. A large cyst in the anterior segment of the right lobe is unchanged. Gallbladder is unremarkable. Mild diffuse biliary ductal dilatation remains stable. Pancreas: No mass or inflammatory changes. No evidence of pancreatic ductal dilatation. Spleen: Within normal limits in size and appearance. Adrenals/Urinary Tract: No masses identified. Left lower pole renal cyst shows no significant change. No evidence of ureteral calculi or hydronephrosis. Stomach/Bowel: No evidence of obstruction, inflammatory process or abnormal fluid  collections. Surgical clips again seen in the right colon. A soft tissue nodule or lymph node is again seen adjacent to the surgical clips, which measures 1.7 x 1.5 cm and remains stable since previous study. Vascular/Lymphatic: Stable 1.7 cm right pericolonic lymph node or soft tissue density, as noted above. No other sites of lymphadenopathy identified. No abdominal aortic aneurysm. Aortic atherosclerotic calcification noted. Reproductive:  No mass or other significant abnormality. Other:  None. Musculoskeletal:  No suspicious bone lesions identified. IMPRESSION: Mild progression of liver metastases since prior exam. Stable 1.7 cm soft tissue nodule or lymph node adjacent to surgical clips in right colon. New atelectasis in posteromedial left lower lobe, which obscures a 9 mm pulmonary metastasis in this region on recent CT. New 1 cm ill-defined posterior right lower lobe nodule is nonspecific, and could represent infectious or inflammatory etiology or metastasis. Electronically Signed   By: Marlaine Hind M.D.   On: 11/08/2020 10:33   DG Chest Portable 1 View  Result Date: 11/08/2020 CLINICAL DATA:  Shortness of breath.  COVID in chemotherapy patient. EXAM: PORTABLE CHEST 1 VIEW COMPARISON:  09/24/2020 chest CT FINDINGS: Porta catheter with tip at the upper right atrium. Normal heart size and mediastinal contours. 13 mm nodule over the left mid lung. Linear opacity at the left base compatible with atelectasis. There is known pulmonary metastatic disease. IMPRESSION: No visible pneumonia. Electronically Signed   By: Monte Fantasia M.D.   On: 11/08/2020 08:46   DG Abd 2 Views  Result Date: 11/07/2020 CLINICAL DATA:  History of abdominal pain and bloating for 2 days, history of colon carcinoma with metastatic disease EXAM: ABDOMEN - 2 VIEW COMPARISON:  CT from 09/24/2020 FINDINGS: Scattered large and small bowel gas is noted. No free air is seen. No abnormal mass or abnormal calcifications are noted.  Postsurgical changes in the right colon are noted. Degenerative changes of lumbar spine are noted. IMPRESSION: No acute abnormality noted. Electronically Signed   By: Inez Catalina M.D.   On: 11/07/2020 15:43    Assessment and plan- Patient is a 80 y.o. male diagnosed with metastatic colon cancer who presents to symptom management clinic for abdominal  pain, constipation, diarrhea.  Etiology of symptoms is unclear. Previous xray and ct were negative for SBO though ongoing symptoms suspicious. Will get xray again today to re-evaluate. IV fluids in clinic today. Labs reviewed and overall stable w/o acute finding.   Xray was negative for SBO or other acute etiology. Discussed with Dr. Rogue Bussing who feels symptoms likely secondary to chemotherapy. Patient had progression in liver on recent CT scan and he will see patient to discuss possible chemo break vs change in treatment. Supportive care in the interim.   Follow up with Dr. Rogue Bussing on 2/1 for re-evaluation as scheduled.    Visit Diagnosis 1. Lower abdominal pain     Patient expressed understanding and was in agreement with this plan. He also understands that He can call clinic at any time with any questions, concerns, or complaints.   Thank you for allowing me to participate in the care of this very pleasant patient.   Beckey Rutter, DNP, AGNP-[facility]

## 2020-11-19 ENCOUNTER — Encounter: Payer: Self-pay | Admitting: Internal Medicine

## 2020-11-19 ENCOUNTER — Inpatient Hospital Stay: Payer: Medicare HMO | Admitting: Internal Medicine

## 2020-11-19 ENCOUNTER — Inpatient Hospital Stay: Payer: Medicare HMO

## 2020-11-19 ENCOUNTER — Inpatient Hospital Stay: Payer: Medicare HMO | Attending: Internal Medicine

## 2020-11-19 DIAGNOSIS — Z7189 Other specified counseling: Secondary | ICD-10-CM

## 2020-11-19 DIAGNOSIS — C182 Malignant neoplasm of ascending colon: Secondary | ICD-10-CM

## 2020-11-19 DIAGNOSIS — R109 Unspecified abdominal pain: Secondary | ICD-10-CM | POA: Diagnosis not present

## 2020-11-19 DIAGNOSIS — I959 Hypotension, unspecified: Secondary | ICD-10-CM | POA: Insufficient documentation

## 2020-11-19 DIAGNOSIS — C787 Secondary malignant neoplasm of liver and intrahepatic bile duct: Secondary | ICD-10-CM | POA: Diagnosis not present

## 2020-11-19 DIAGNOSIS — K769 Liver disease, unspecified: Secondary | ICD-10-CM | POA: Diagnosis not present

## 2020-11-19 DIAGNOSIS — Z515 Encounter for palliative care: Secondary | ICD-10-CM | POA: Diagnosis not present

## 2020-11-19 DIAGNOSIS — F17210 Nicotine dependence, cigarettes, uncomplicated: Secondary | ICD-10-CM | POA: Diagnosis not present

## 2020-11-19 DIAGNOSIS — R110 Nausea: Secondary | ICD-10-CM | POA: Diagnosis not present

## 2020-11-19 LAB — COMPREHENSIVE METABOLIC PANEL
ALT: 14 U/L (ref 0–44)
AST: 31 U/L (ref 15–41)
Albumin: 2.6 g/dL — ABNORMAL LOW (ref 3.5–5.0)
Alkaline Phosphatase: 107 U/L (ref 38–126)
Anion gap: 9 (ref 5–15)
BUN: 8 mg/dL (ref 8–23)
CO2: 23 mmol/L (ref 22–32)
Calcium: 8.2 mg/dL — ABNORMAL LOW (ref 8.9–10.3)
Chloride: 106 mmol/L (ref 98–111)
Creatinine, Ser: 0.97 mg/dL (ref 0.61–1.24)
GFR, Estimated: >60 mL/min (ref >60)
Glucose, Bld: 199 mg/dL — ABNORMAL HIGH (ref 70–99)
Potassium: 3.8 mmol/L (ref 3.5–5.1)
Sodium: 138 mmol/L (ref 135–145)
Total Bilirubin: 0.6 mg/dL (ref 0.3–1.2)
Total Protein: 6.2 g/dL — ABNORMAL LOW (ref 6.5–8.1)

## 2020-11-19 LAB — CBC WITH DIFFERENTIAL/PLATELET
Abs Immature Granulocytes: 0.02 10*3/uL (ref 0.00–0.07)
Basophils Absolute: 0 10*3/uL (ref 0.0–0.1)
Basophils Relative: 1 %
Eosinophils Absolute: 0.5 10*3/uL (ref 0.0–0.5)
Eosinophils Relative: 11 %
HCT: 34.4 % — ABNORMAL LOW (ref 39.0–52.0)
Hemoglobin: 11.7 g/dL — ABNORMAL LOW (ref 13.0–17.0)
Immature Granulocytes: 1 %
Lymphocytes Relative: 17 %
Lymphs Abs: 0.7 10*3/uL (ref 0.7–4.0)
MCH: 34 pg (ref 26.0–34.0)
MCHC: 34 g/dL (ref 30.0–36.0)
MCV: 100 fL (ref 80.0–100.0)
Monocytes Absolute: 0.6 10*3/uL (ref 0.1–1.0)
Monocytes Relative: 14 %
Neutro Abs: 2.3 10*3/uL (ref 1.7–7.7)
Neutrophils Relative %: 56 %
Platelets: 158 10*3/uL (ref 150–400)
RBC: 3.44 MIL/uL — ABNORMAL LOW (ref 4.22–5.81)
RDW: 15.4 % (ref 11.5–15.5)
WBC: 4.1 10*3/uL (ref 4.0–10.5)
nRBC: 0 % (ref 0.0–0.2)

## 2020-11-19 MED ORDER — DEXAMETHASONE SODIUM PHOSPHATE 10 MG/ML IJ SOLN
10.0000 mg | Freq: Once | INTRAMUSCULAR | Status: AC
  Administered 2020-11-19: 10 mg via INTRAVENOUS
  Filled 2020-11-19: qty 1

## 2020-11-19 MED ORDER — HEPARIN SOD (PORK) LOCK FLUSH 100 UNIT/ML IV SOLN
INTRAVENOUS | Status: AC
  Filled 2020-11-19: qty 5

## 2020-11-19 MED ORDER — SODIUM CHLORIDE 0.9% FLUSH
10.0000 mL | Freq: Once | INTRAVENOUS | Status: AC
  Administered 2020-11-19: 10 mL via INTRAVENOUS
  Filled 2020-11-19: qty 10

## 2020-11-19 MED ORDER — HEPARIN SOD (PORK) LOCK FLUSH 100 UNIT/ML IV SOLN
500.0000 [IU] | Freq: Once | INTRAVENOUS | Status: AC
  Administered 2020-11-19: 500 [IU]
  Filled 2020-11-19: qty 5

## 2020-11-19 MED ORDER — ONDANSETRON 8 MG PO TBDP: 8.0000 mg | ORAL_TABLET | Freq: Three times a day (TID) | ORAL | 0 refills | Status: DC | PRN

## 2020-11-19 MED ORDER — SODIUM CHLORIDE 0.9 % IV SOLN
Freq: Once | INTRAVENOUS | Status: AC
  Filled 2020-11-19: qty 250

## 2020-11-19 NOTE — Progress Notes (Unsigned)
Having weakness, feeling tired. Still having diarrhea. States that he takes tramadol for pain which is not listed. Wanted to see if he could get a new rx for it.

## 2020-11-19 NOTE — Progress Notes (Signed)
[facility] CONSULT NOTE  Patient Care Team: Marina Goodell, MD as PCP - General (Family Medicine) Earna Coder, MD as Consulting Physician (Hematology and Oncology)  CHIEF COMPLAINTS/PURPOSE OF CONSULTATION: Colon cancer  #  Oncology History Overview Note  # MAY 2020- 3. 03/09/19 Liver biopsy. Microscopic examination shows malignant cells with glandular architecture consistent with adenocarcinoma. The malignant cells are positive for CK20 and CDX-2. These findings support the clinical impression of metastatic colon adenocarcinoma. 4. 03/10/19 R hemicolectomy. Tumor site cecum. Adenocarcinoma. Mucinous features present. G2. No tumor deposits. Invades visceral peritoneum. No tumor perforation. LVI present. PNI not identified. All margins uninvolved. 1/12 LNs. PT4apN1. Periappendiceal inflammation c/w resolving abscess. Microsatellite stable (MSS). [Dr.Mettu; [facility]]  # SEP 4th 2020 [compared to May 2020]  Interval increase in size of the metastases to the hepatic dome, The metastasis to the left hepatic lobe is unchanged; 2.  New subcentimeter hypoattenuating lesion in the inferior right hepatic lobe, incompletely characterized on CT. 3.  Postsurgical changes following right hemicolectomy.  # OCT 2020- FOLFOX +avastin; CT dec 22nd 2020- [compared to [facility] sep 9th 2020]-Liver- slight progression versus stable disease; CT scan SEP 4th 2021- Progressive disease-left lower lobe lung nodule 8 mm [previously 4 mm]; increase in size of the hepatic metastases by few millimeters.;  Soft tissue nodule adjacent to anastomotic site again increased by few millimeters.STOP FOLFOX; cont avastin  #  SEP 20th, 2021- FOLFIRI+ AVASTIN  # JAN 2022- COVID [s/p Mab infusion; pills; skin rash resolved.]  # NGS/F-ONE-MUTATED K-RAS [G]  # PALLIATIVE CARE EVALUATION: 09/20/2019-Oscar [name]  # PAIN MANAGEMENT: NA   DIAGNOSIS: COLON CANCER  STAGE:  IV     ;  GOALS:Palliative  CURRENT/MOST RECENT  THERAPY : FOLFIRI+ avastin [C]    Cancer of right colon (HCC)  07/05/2019 Initial Diagnosis   Cancer of right colon (HCC)   07/24/2019 - 06/25/2020 Chemotherapy   The patient had dexamethasone (DECADRON) 4 MG tablet, 8 mg, Oral, Daily, 1 of 1 cycle, Start date: --, End date: -- palonosetron (ALOXI) injection 0.25 mg, 0.25 mg, Intravenous,  Once, 23 of 25 cycles Administration: 0.25 mg (07/24/2019), 0.25 mg (08/07/2019), 0.25 mg (08/21/2019), 0.25 mg (09/04/2019), 0.25 mg (09/20/2019), 0.25 mg (10/04/2019), 0.25 mg (10/16/2019), 0.25 mg (10/30/2019), 0.25 mg (11/22/2019), 0.25 mg (12/06/2019), 0.25 mg (12/20/2019), 0.25 mg (01/03/2020), 0.25 mg (01/29/2020), 0.25 mg (02/12/2020), 0.25 mg (02/26/2020), 0.25 mg (03/11/2020), 0.25 mg (03/25/2020), 0.25 mg (04/08/2020), 0.25 mg (04/24/2020), 0.25 mg (05/08/2020), 0.25 mg (05/22/2020), 0.25 mg (06/10/2020), 0.25 mg (06/25/2020) leucovorin 800 mg in dextrose 5 % 250 mL infusion, 844 mg, Intravenous,  Once, 23 of 25 cycles Administration: 800 mg (07/24/2019), 800 mg (08/07/2019), 800 mg (08/21/2019), 800 mg (09/04/2019), 800 mg (09/20/2019), 800 mg (10/04/2019), 800 mg (10/16/2019), 800 mg (10/30/2019), 800 mg (11/22/2019), 800 mg (12/06/2019), 800 mg (12/20/2019), 800 mg (01/03/2020), 800 mg (01/29/2020), 800 mg (02/12/2020), 800 mg (02/26/2020), 800 mg (03/11/2020), 800 mg (03/25/2020), 800 mg (04/08/2020), 800 mg (04/24/2020), 800 mg (05/08/2020), 800 mg (05/22/2020), 800 mg (06/10/2020), 800 mg (06/25/2020) oxaliplatin (ELOXATIN) 180 mg in dextrose 5 % 500 mL chemo infusion, 85 mg/m2 = 180 mg, Intravenous,  Once, 23 of 25 cycles Dose modification: 178 mg (original dose 85 mg/m2, Cycle 17, Reason: Other (see comments), Comment: insurance adjusted dose ) Administration: 180 mg (07/24/2019), 180 mg (08/07/2019), 180 mg (08/21/2019), 180 mg (09/04/2019), 180 mg (09/20/2019), 180 mg (10/04/2019), 180 mg (10/16/2019), 180 mg (10/30/2019), 180 mg (11/22/2019), 180 mg (12/06/2019), 180 mg (12/20/2019),  180 mg (01/03/2020),  180 mg (01/29/2020), 180 mg (02/12/2020), 180 mg (02/26/2020), 180 mg (03/11/2020), 180 mg (04/08/2020), 180 mg (04/24/2020), 180 mg (05/08/2020), 180 mg (05/22/2020), 180 mg (06/10/2020), 180 mg (06/25/2020) fluorouracil (ADRUCIL) 5,000 mg in sodium chloride 0.9 % 150 mL chemo infusion, 5,050 mg, Intravenous, 1 Day/Dose, 23 of 25 cycles Administration: 5,000 mg (07/24/2019), 5,000 mg (08/07/2019), 5,000 mg (08/21/2019), 5,050 mg (09/04/2019), 5,000 mg (10/04/2019), 5,000 mg (10/16/2019), 5,000 mg (11/22/2019), 5,000 mg (12/06/2019), 5,000 mg (12/20/2019), 5,000 mg (01/03/2020), 5,000 mg (01/29/2020), 5,000 mg (02/12/2020), 5,000 mg (02/26/2020), 5,000 mg (03/11/2020), 5,000 mg (03/25/2020), 5,000 mg (04/08/2020), 5,000 mg (04/24/2020), 5,000 mg (05/08/2020), 5,000 mg (05/22/2020), 5,000 mg (06/10/2020), 5,000 mg (06/25/2020) bevacizumab-bvzr (ZIRABEV) 400 mg in sodium chloride 0.9 % 100 mL chemo infusion, 5 mg/kg = 400 mg, Intravenous,  Once, 23 of 25 cycles Administration: 400 mg (08/07/2019), 400 mg (08/21/2019), 400 mg (09/04/2019), 400 mg (09/20/2019), 400 mg (10/04/2019), 400 mg (10/16/2019), 400 mg (10/30/2019), 400 mg (11/22/2019), 400 mg (12/06/2019), 400 mg (12/20/2019), 400 mg (01/03/2020), 400 mg (01/29/2020), 400 mg (02/12/2020), 400 mg (02/26/2020), 400 mg (03/11/2020), 400 mg (03/25/2020), 400 mg (04/08/2020), 400 mg (04/24/2020), 400 mg (05/08/2020), 400 mg (05/22/2020), 400 mg (06/10/2020), 400 mg (06/25/2020)  for chemotherapy treatment.    07/08/2020 -  Chemotherapy    Patient is on Treatment Plan: COLORECTAL FOLFIRI / BEVACIZUMAB Q14D      11/05/2020 Cancer Staging   Staging form: Colon and Rectum, AJCC 8th Edition - Clinical: Stage IVC (pM1c) - Signed by Cammie Sickle, MD on 11/05/2020     HISTORY OF PRESENTING ILLNESS:  Oscar [name] 80 y.o.  male with metastatic colon cancer to the liver currently on FOLFIRI plus Avastin chemotherapy.  In the interim patient evaluated in the emergency room for worsening abdominal pain CT  scan showed no obstruction no acute process however showed liver lesions were getting bigger.  Patient was also evaluated in the symptomatic clinic twice for worse abdominal pain-again abdominal x-rays no acute process.  Patient complains of continued weight loss.  Complaints diarrhea 5-6 loose stools a day.  Patient not using any antidiabetes.  Poor appetite.  Abdominal cramping with diarrhea.   Review of Systems  Constitutional: Positive for malaise/fatigue and weight loss. Negative for chills, diaphoresis and fever.  HENT: Negative for nosebleeds and sore throat.   Eyes: Negative for double vision.  Respiratory: Negative for cough, hemoptysis, sputum production, shortness of breath and wheezing.   Cardiovascular: Negative for chest pain, palpitations, orthopnea and leg swelling.  Gastrointestinal: Positive for abdominal pain and diarrhea. Negative for blood in stool, constipation, heartburn, melena, nausea and vomiting.  Genitourinary: Negative for dysuria, frequency and urgency.  Musculoskeletal: Positive for back pain and joint pain.  Skin: Negative.  Negative for itching and rash.  Neurological: Positive for dizziness and tingling. Negative for focal weakness, weakness and headaches.  Endo/Heme/Allergies: Does not bruise/bleed easily.  Psychiatric/Behavioral: Negative for depression. The patient is not nervous/anxious and does not have insomnia.      MEDICAL HISTORY:  Past Medical History:  Diagnosis Date   Arthritis    Cancer ([place])    colon cancer 02/2019 per pt    Diabetes mellitus without complication ([place])    H/O colon cancer, stage IV    Hyperlipemia    Hypertension     SURGICAL HISTORY: Past Surgical History:  Procedure Laterality Date   IR IMAGING GUIDED PORT INSERTION  07/20/2019   JOINT REPLACEMENT      SOCIAL  HISTORY: Social History   Socioeconomic History   Marital status: Married    Spouse name: Not on file   Number of children: Not on file    Years of education: Not on file   Highest education level: Not on file  Occupational History   Not on file  Tobacco Use   Smoking status: Current Every Day Smoker    Packs/day: 0.25    Types: Cigarettes   Smokeless tobacco: Never Used   Tobacco comment: 1 to 2 cigarettes a day occasionally  Vaping Use   Vaping Use: Never used  Substance and Sexual Activity   Alcohol use: No   Drug use: No   Sexual activity: Not on file  Other Topics Concern   Not on file  Social History Narrative   Recruitment consultant retd; lives in [place]; smoking 3cig/day; [3/4 ppd x started at 9 years]; no alcohol. Son & daughter; wife dementia [waiting for placement].    Social Determinants of Health   Financial Resource Strain: Not on file  Food Insecurity: Not on file  Transportation Needs: Not on file  Physical Activity: Not on file  Stress: Not on file  Social Connections: Not on file  Intimate Partner Violence: Not on file    FAMILY HISTORY: Family History  Problem Relation Age of Onset   Peptic Ulcer Disease Father     ALLERGIES:  is allergic to ace inhibitors.  MEDICATIONS:  Current Outpatient Medications  Medication Sig Dispense Refill   amLODipine (NORVASC) 5 MG tablet Take 5 mg by mouth daily.     meloxicam (MOBIC) 15 MG tablet Take 1 tablet (15 mg total) by mouth daily. 90 tablet 0   nystatin (MYCOSTATIN) 100000 UNIT/ML suspension Take 5 mLs (500,000 Units total) by mouth 4 (four) times daily. 60 mL 0   pravastatin (PRAVACHOL) 40 MG tablet Take 40 mg by mouth daily.      tamsulosin (FLOMAX) 0.4 MG CAPS capsule Take 1 capsule (0.4 mg total) by mouth daily. 30 capsule 11   ondansetron (ZOFRAN ODT) 8 MG disintegrating tablet Take 1 tablet (8 mg total) by mouth every 8 (eight) hours as needed for nausea or vomiting. 45 tablet 0   No current facility-administered medications for this visit.      Marland Kitchen  PHYSICAL EXAMINATION: ECOG PERFORMANCE STATUS: 0 -  Asymptomatic  Vitals:   11/19/20 0910  BP: (!) 88/55  Pulse: 80  Resp: 16  Temp: 99.5 F (37.5 C)  SpO2: 100%   Filed Weights   11/19/20 0910  Weight: 176 lb (79.8 kg)    Physical Exam HENT:     Head: Normocephalic and atraumatic.     Mouth/Throat:     Pharynx: No oropharyngeal exudate.     Comments: Whitish patches noted right side.  Eyes:     Pupils: Pupils are equal, round, and reactive to light.  Cardiovascular:     Rate and Rhythm: Normal rate and regular rhythm.  Pulmonary:     Effort: No respiratory distress.     Breath sounds: No wheezing.     Comments: Decreased breath sounds bilaterally.  No wheeze or crackles Abdominal:     General: Bowel sounds are normal. There is no distension.     Palpations: Abdomen is soft. There is no mass.     Tenderness: There is no abdominal tenderness. There is no guarding or rebound.  Musculoskeletal:        General: No tenderness. Normal range of motion.  Cervical back: Normal range of motion and neck supple.  Skin:    General: Skin is warm.  Neurological:     Mental Status: He is alert and oriented to person, place, and time.  Psychiatric:        Mood and Affect: Affect normal.    LABORATORY DATA:  I have reviewed the data as listed Lab Results  Component Value Date   WBC 4.1 11/19/2020   HGB 11.7 (L) 11/19/2020   HCT 34.4 (L) 11/19/2020   MCV 100.0 11/19/2020   PLT 158 11/19/2020   Recent Labs    06/25/20 0841 07/08/20 0843 07/22/20 0836 08/05/20 0821 11/08/20 0808 11/18/20 1354 11/19/20 0843  NA 138 139 138   < > 138 133* 138  K 3.6 3.6 3.6   < > 4.2 3.8 3.8  CL 108 108 107   < > 100 104 106  CO2 $Re'24 25 25   'zWV$ < > $R'27 23 23  'Uj$ GLUCOSE 201* 209* 195*   < > 163* 165* 199*  BUN $Re'10 9 10   'IsH$ < > $R'12 10 8  'lL$ CREATININE 0.80 0.70 0.89   < > 0.94 0.93 0.97  CALCIUM 8.0* 8.0* 8.1*   < > 8.9 8.0* 8.2*  GFRNONAA >60 >60 >60   < > >60 >60 >60  GFRAA >60 >60 >60  --   --   --   --   PROT 6.5 6.5 6.7   < > 7.3 6.5 6.2*   ALBUMIN 3.2* 3.1* 3.1*   < > 2.9* 2.6* 2.6*  AST 32 31 29   < > 43* 28 31  ALT $Re'20 19 19   'xgL$ < > $R'23 16 14  'oE$ ALKPHOS 121 102 108   < > 93 107 107  BILITOT 0.8 0.7 0.8   < > 2.0* 0.9 0.6   < > = values in this interval not displayed.    RADIOGRAPHIC STUDIES: I have personally reviewed the radiological images as listed and agreed with the findings in the report. DG Abdomen 1 View  Result Date: 11/08/2020 CLINICAL DATA:  Constipation EXAM: ABDOMEN - 1 VIEW COMPARISON:  11/07/2020 FINDINGS: The bowel gas pattern is normal. No radio-opaque calculi or other significant radiographic abnormality are seen. Bowel staples right abdomen. IMPRESSION: Negative. Electronically Signed   By: Franchot Gallo M.D.   On: 11/08/2020 07:59   CT HEAD WO CONTRAST  Result Date: 11/09/2020 CLINICAL DATA:  80 year old male with history of dizziness. EXAM: CT HEAD WITHOUT CONTRAST TECHNIQUE: Contiguous axial images were obtained from the base of the skull through the vertex without intravenous contrast. COMPARISON:  Head CT 03/06/2016. FINDINGS: Brain: Mild cerebral atrophy. Patchy and confluent areas of decreased attenuation are noted throughout the deep and periventricular white matter of the cerebral hemispheres bilaterally, compatible with chronic microvascular ischemic disease. Well-defined areas of low attenuation are noted in the basal ganglia bilaterally, most evident in the head of the left caudate nucleus, indicative of old lacunar infarcts. No evidence of acute infarction, hemorrhage, hydrocephalus, extra-axial collection or mass lesion/mass effect. Vascular: No hyperdense vessel or unexpected calcification. Skull: Normal. Negative for fracture or focal lesion. Sinuses/Orbits: No acute finding. Other: None. IMPRESSION: 1. No acute intracranial abnormalities. 2. Mild cerebral atrophy with chronic microvascular ischemic changes in the cerebral white matter and old lacunar infarcts in the basal ganglia bilaterally, as  above. Electronically Signed   By: Vinnie Langton M.D.   On: 11/09/2020 13:36   CT ABDOMEN PELVIS W CONTRAST  Result  Date: 11/08/2020 CLINICAL DATA:  Lower abdominal pain, nausea and vomiting, and constipation for 4 days. Metastatic colon carcinoma, currently undergoing chemotherapy. EXAM: CT ABDOMEN AND PELVIS WITH CONTRAST TECHNIQUE: Multidetector CT imaging of the abdomen and pelvis was performed using the standard protocol following bolus administration of intravenous contrast. CONTRAST:  168mL OMNIPAQUE IOHEXOL 300 MG/ML  SOLN COMPARISON:  09/24/2020 FINDINGS: Lower Chest: New atelectasis is seen in the posteromedial left lower lobe which partially obscures a 9 mm pulmonary nodule which was more clearly demonstrated on recent CT. New mild patchy infiltrate is seen in the inferior lingula. A new ill-defined nodular opacity is seen in the posterior right lower lobe which measures 10 mm on image 4/4, and this could represent infectious or inflammatory etiology or metastasis. Hepatobiliary: A hypovascular mass in the lateral segment of the left lobe currently measures 4.5 x 4.4 cm, compared to 3.9 x 3.6 cm previously. A smaller low-attenuation lesion in the posterior right hepatic lobe currently measures 1.4 cm compared to 0.8 cm previously. A large cyst in the anterior segment of the right lobe is unchanged. Gallbladder is unremarkable. Mild diffuse biliary ductal dilatation remains stable. Pancreas: No mass or inflammatory changes. No evidence of pancreatic ductal dilatation. Spleen: Within normal limits in size and appearance. Adrenals/Urinary Tract: No masses identified. Left lower pole renal cyst shows no significant change. No evidence of ureteral calculi or hydronephrosis. Stomach/Bowel: No evidence of obstruction, inflammatory process or abnormal fluid collections. Surgical clips again seen in the right colon. A soft tissue nodule or lymph node is again seen adjacent to the surgical clips, which  measures 1.7 x 1.5 cm and remains stable since previous study. Vascular/Lymphatic: Stable 1.7 cm right pericolonic lymph node or soft tissue density, as noted above. No other sites of lymphadenopathy identified. No abdominal aortic aneurysm. Aortic atherosclerotic calcification noted. Reproductive:  No mass or other significant abnormality. Other:  None. Musculoskeletal:  No suspicious bone lesions identified. IMPRESSION: Mild progression of liver metastases since prior exam. Stable 1.7 cm soft tissue nodule or lymph node adjacent to surgical clips in right colon. New atelectasis in posteromedial left lower lobe, which obscures a 9 mm pulmonary metastasis in this region on recent CT. New 1 cm ill-defined posterior right lower lobe nodule is nonspecific, and could represent infectious or inflammatory etiology or metastasis. Electronically Signed   By: Marlaine Hind M.D.   On: 11/08/2020 10:33   DG Chest Portable 1 View  Result Date: 11/08/2020 CLINICAL DATA:  Shortness of breath.  COVID in chemotherapy patient. EXAM: PORTABLE CHEST 1 VIEW COMPARISON:  09/24/2020 chest CT FINDINGS: Porta catheter with tip at the upper right atrium. Normal heart size and mediastinal contours. 13 mm nodule over the left mid lung. Linear opacity at the left base compatible with atelectasis. There is known pulmonary metastatic disease. IMPRESSION: No visible pneumonia. Electronically Signed   By: Monte Fantasia M.D.   On: 11/08/2020 08:46   DG Abd 2 Views  Result Date: 11/18/2020 CLINICAL DATA:  Diarrhea for 1.5 weeks. Abdominal pain for 2.5 weeks. Colon cancer. History of constipation. EXAM: ABDOMEN - 2 VIEW COMPARISON:  11/08/2020 CT FINDINGS: Upright and supine views. The upright view demonstrates no free intraperitoneal air or significant air-fluid levels. Supine views demonstrate no gaseous distention of bowel loops. Surgical sutures in the right side of the abdomen. No abnormal abdominal calcifications. No appendicolith.  IMPRESSION: No acute findings. Electronically Signed   By: Abigail Miyamoto M.D.   On: 11/18/2020 16:46   DG  Abd 2 Views  Result Date: 11/07/2020 CLINICAL DATA:  History of abdominal pain and bloating for 2 days, history of colon carcinoma with metastatic disease EXAM: ABDOMEN - 2 VIEW COMPARISON:  CT from 09/24/2020 FINDINGS: Scattered large and small bowel gas is noted. No free air is seen. No abnormal mass or abnormal calcifications are noted. Postsurgical changes in the right colon are noted. Degenerative changes of lumbar spine are noted. IMPRESSION: No acute abnormality noted. Electronically Signed   By: Inez Catalina M.D.   On: 11/07/2020 15:43    ASSESSMENT & PLAN:   Cancer of right colon ([place]) #Right-sided colon adenocarcinoma-with synchronous metastasis to liver/unresectable. Currently on FOLFIRI plus Avastin CT scan -Nov 08, 2020-[compared to DEC 2021] Mild progression of liver metastases; Stable 1.7 cm soft tissue nodule adjacent to surgical lips in right colon.     # given poor tolerance to therapy [continued diarrhea; hypotension systolic 18C; extreme fatigue]-I think it is reasonable to give patient a break from chemotherapy; also discontinue current therapy given the progression of disease.   #Given the liver predominant disease-I think is reasonable to consider liver directed therapy; Y 90.  Recommend MRI ASAP.  If patient is not a candidate for liver directed therapy for any reason, other options include regorafenib or TAS 102.   #Hypotension-clinically not sepsis; suspicion of dehydration-diarrhea/poor p.o. intake.  Recommend IV fluids.  Hold chemotherapy. HOLD amlodipine.   # Constipation/diarrhea- sec to chemo  # PN- G-1-2; sec to oxaliplatin; STABLE.  #I spoke to patient's daughter Haynes Dage and son Chrissie Noa at length regarding the above.  Discussed overall poor prognosis of metastatic colon cancer-median 2 years or so.  Also discussed patient's survival moving forward will  depend upon patient's response to subsequent therapies and the inability to tolerate further therapies.  # DISPOSITION:  #HOLD chemo today;  # IVFs over 1 hour; DEX 10 mg IVP # in 2 days- IVFs- 1 hour # follow up in 2 weeks- MD; labs- cbc/cmp/UA/ CEA; MRI liver; possible IVFs -Dr.B  # I reviewed the blood work- with the patient in detail; also reviewed the imaging independently [as summarized above]; and with the patient in detail.   # 40 minutes face-to-face with the patient discussing the above plan of care; more than 50% of time spent on prognosis/ natural history; counseling and coordination.   All questions were answered. The patient knows to call the clinic with any problems, questions or concerns.    Cammie Sickle, MD 11/20/2020 8:29 AM

## 2020-11-19 NOTE — Assessment & Plan Note (Addendum)
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 Currently on FOLFIRI plus Avastin CT scan -Nov 08, 2020-[compared to DEC 2021] Mild progression of liver metastases; Stable 1.7 cm soft tissue nodule adjacent to surgical lips in right colon.     # given poor tolerance to therapy [continued diarrhea; hypotension systolic 01U; extreme fatigue]-I think it is reasonable to give patient a break from chemotherapy; also discontinue current therapy given the progression of disease.   #Given the liver predominant disease-I think is reasonable to consider liver directed therapy; Y 90.  Recommend MRI ASAP.  If patient is not a candidate for liver directed therapy for any reason, other options include regorafenib or TAS 102.   #Hypotension-clinically not sepsis; suspicion of dehydration-diarrhea/poor p.o. intake.  Recommend IV fluids.  Hold chemotherapy. HOLD amlodipine.   # Constipation/diarrhea- sec to chemo  # PN- G-1-2; sec to oxaliplatin; STABLE.  #I spoke to patient's daughter Haynes Dage and son Chrissie Noa at length regarding the above.  Discussed overall poor prognosis of metastatic colon cancer-median 2 years or so.  Also discussed patient's survival moving forward will depend upon patient's response to subsequent therapies and the inability to tolerate further therapies.  # DISPOSITION:  #HOLD chemo today;  # IVFs over 1 hour; DEX 10 mg IVP # in 2 days- IVFs- 1 hour # follow up in 2 weeks- MD; labs- cbc/cmp/UA/ CEA; MRI liver; possible IVFs -Dr.B  # I reviewed the blood work- with the patient in detail; also reviewed the imaging independently [as summarized above]; and with the patient in detail.   # 40 minutes face-to-face with the patient discussing the above plan of care; more than 50% of time spent on prognosis/ natural history; counseling and coordination.

## 2020-11-19 NOTE — Progress Notes (Signed)
Pt evaluated by MD . No tx today. Hydration/ IV decradon only today . Pt completed without incident. Pt discharged stable. Return as scheduled.

## 2020-11-19 NOTE — Patient Instructions (Addendum)
#   STOP amlodipine [because of low blood pressure.]  # take immodium 1 tablet every 6-8 hours as needed for diarrhea.

## 2020-11-20 ENCOUNTER — Telehealth: Payer: Self-pay | Admitting: *Deleted

## 2020-11-20 LAB — CEA: CEA: 18 ng/mL — ABNORMAL HIGH (ref 0.0–4.7)

## 2020-11-20 NOTE — Telephone Encounter (Signed)
RN called pt regarding tomm schedule. He has 2 appt in [facility] . 1 at 8:30 for IVF's and the other is at 1 PM for pump removal. Pt lives in [place] and would have to make 2 trips. Pt agreed to come at 11:00 am for IVF and then remove his pump. He states that would be great as he has another appt in [place] at 2 PM.

## 2020-11-21 ENCOUNTER — Other Ambulatory Visit: Payer: Self-pay

## 2020-11-21 ENCOUNTER — Inpatient Hospital Stay: Payer: Medicare HMO

## 2020-11-21 ENCOUNTER — Inpatient Hospital Stay (HOSPITAL_BASED_OUTPATIENT_CLINIC_OR_DEPARTMENT_OTHER): Payer: Medicare HMO | Admitting: Hospice and Palliative Medicine

## 2020-11-21 VITALS — BP 150/85 | HR 68 | Temp 97.5°F | Resp 17

## 2020-11-21 DIAGNOSIS — R110 Nausea: Secondary | ICD-10-CM | POA: Diagnosis not present

## 2020-11-21 DIAGNOSIS — Z515 Encounter for palliative care: Secondary | ICD-10-CM

## 2020-11-21 DIAGNOSIS — I959 Hypotension, unspecified: Secondary | ICD-10-CM | POA: Diagnosis not present

## 2020-11-21 DIAGNOSIS — C182 Malignant neoplasm of ascending colon: Secondary | ICD-10-CM

## 2020-11-21 DIAGNOSIS — Z7189 Other specified counseling: Secondary | ICD-10-CM

## 2020-11-21 DIAGNOSIS — R109 Unspecified abdominal pain: Secondary | ICD-10-CM | POA: Diagnosis not present

## 2020-11-21 DIAGNOSIS — C787 Secondary malignant neoplasm of liver and intrahepatic bile duct: Secondary | ICD-10-CM | POA: Diagnosis not present

## 2020-11-21 DIAGNOSIS — F17210 Nicotine dependence, cigarettes, uncomplicated: Secondary | ICD-10-CM | POA: Diagnosis not present

## 2020-11-21 MED ORDER — ONDANSETRON HCL 4 MG/2ML IJ SOLN
8.0000 mg | Freq: Once | INTRAMUSCULAR | Status: AC
  Administered 2020-11-21: 8 mg via INTRAVENOUS
  Filled 2020-11-21: qty 4

## 2020-11-21 MED ORDER — HEPARIN SOD (PORK) LOCK FLUSH 100 UNIT/ML IV SOLN
500.0000 [IU] | Freq: Once | INTRAVENOUS | Status: AC | PRN
  Administered 2020-11-21: 500 [IU]
  Filled 2020-11-21: qty 5

## 2020-11-21 MED ORDER — PROCHLORPERAZINE MALEATE 10 MG PO TABS: 10.0000 mg | ORAL_TABLET | Freq: Four times a day (QID) | ORAL | 0 refills | Status: DC | PRN

## 2020-11-21 MED ORDER — SODIUM CHLORIDE 0.9 % IV SOLN: Freq: Once | INTRAVENOUS | Status: DC

## 2020-11-21 MED ORDER — SODIUM CHLORIDE 0.9 % IV SOLN
Freq: Once | INTRAVENOUS | Status: AC
  Filled 2020-11-21: qty 250

## 2020-11-21 MED ORDER — OXYCODONE HCL 5 MG PO TABS: 5.0000 mg | ORAL_TABLET | Freq: Four times a day (QID) | ORAL | 0 refills | Status: DC | PRN

## 2020-11-21 MED ORDER — SODIUM CHLORIDE 0.9% FLUSH
10.0000 mL | Freq: Once | INTRAVENOUS | Status: AC | PRN
  Administered 2020-11-21: 10 mL
  Filled 2020-11-21: qty 10

## 2020-11-21 MED ORDER — DEXAMETHASONE SODIUM PHOSPHATE 10 MG/ML IJ SOLN
10.0000 mg | Freq: Once | INTRAMUSCULAR | Status: AC
  Administered 2020-11-21: 10 mg via INTRAVENOUS
  Filled 2020-11-21: qty 1

## 2020-11-21 NOTE — Progress Notes (Signed)
Oscar [name]  [phone] [phone]   Name: Oscar [name] Date: 11/21/2020 MRN: [id number]  DOB: 18-Oct-1941  Patient Care Team: Sofie Hartigan, MD as PCP - General (Family Medicine) Cammie Sickle, MD as Consulting Physician (Hematology and Oncology)    REASON FOR CONSULTATION: Palliative Care consult requested for this 80 y.o. male with multiple medical problems including stage IV colon cancer on FOLFIRI plus Avastin chemotherapy.  Patient was referred to palliative care to help address goals and manage ongoing symptoms.  SOCIAL HISTORY:     reports that he has been smoking cigarettes. He has been smoking about 0.25 packs per day. He has never used smokeless tobacco. He reports that he does not drink alcohol and does not use drugs.   Patient is married.  He and his wife live with his stepdaughter.  His wife has advanced dementia and is followed at home by hospice.  Patient has a son and daughter and two twin stepdaughters.  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  ADVANCE DIRECTIVES:  Does not have  CODE STATUS: DNR/DNI (MOST form completed on 11/02/2019)  PAST MEDICAL HISTORY: Past Medical History:  Diagnosis Date  . Arthritis   . Cancer ([place])    colon cancer 02/2019 per pt   . Diabetes mellitus without complication ([place])   . H/O colon cancer, stage IV   . Hyperlipemia   . Hypertension     PAST SURGICAL HISTORY:  Past Surgical History:  Procedure Laterality Date  . IR IMAGING GUIDED PORT INSERTION  07/20/2019  . JOINT REPLACEMENT      HEMATOLOGY/ONCOLOGY HISTORY:  Oncology History Overview Note  # MAY 2020- 3. 03/09/19 Liver biopsy. Microscopic examination shows malignant cells with glandular architecture consistent with adenocarcinoma. The malignant cells are positive for CK20 and CDX-2. These findings support the clinical impression of metastatic colon adenocarcinoma. 4.  03/10/19 R hemicolectomy. Tumor site cecum. Adenocarcinoma. Mucinous features present. G2. No tumor deposits. Invades visceral peritoneum. No tumor perforation. LVI present. PNI not identified. All margins uninvolved. 1/12 LNs. PT4apN1. Periappendiceal inflammation c/w resolving abscess. Microsatellite stable (MSS). [Dr.Mettu; [facility]]  # SEP 4th 2020 [compared to May 2020]  Interval increase in size of the metastases to the hepatic dome, The metastasis to the left hepatic lobe is unchanged; 2.  New subcentimeter hypoattenuating lesion in the inferior right hepatic lobe, incompletely characterized on CT. 3.  Postsurgical changes following right hemicolectomy.  # OCT 2020- FOLFOX +avastin; CT dec 22nd 2020- [compared to [facility] sep 9th 2020]-Liver- slight progression versus stable disease; CT scan SEP 4th 2021- Progressive disease-left lower lobe lung nodule 8 mm [previously 4 mm]; increase in size of the hepatic metastases by few millimeters.;  Soft tissue nodule adjacent to anastomotic site again increased by few millimeters.STOP FOLFOX; cont avastin  #  SEP 20th, 2021- FOLFIRI+ AVASTIN  # JAN 2022- COVID [s/p Mab infusion; pills; skin rash resolved.]  # NGS/F-ONE-MUTATED K-RAS [G]  # PALLIATIVE CARE EVALUATION: 09/20/2019-[name]  # PAIN MANAGEMENT: NA   DIAGNOSIS: COLON CANCER  STAGE:  IV     ;  GOALS:Palliative  CURRENT/MOST RECENT THERAPY : FOLFIRI+ avastin [C]    Cancer of right colon ([place])  07/05/2019 Initial Diagnosis   Cancer of right colon ([place])   07/24/2019 - 06/25/2020 Chemotherapy   The patient had dexamethasone (DECADRON) 4 MG tablet, 8 mg, Oral, Daily, 1 of 1 cycle, Start date: --, End date: -- palonosetron (ALOXI) injection 0.25  mg, 0.25 mg, Intravenous,  Once, 23 of 25 cycles Administration: 0.25 mg (07/24/2019), 0.25 mg (08/07/2019), 0.25 mg (08/21/2019), 0.25 mg (09/04/2019), 0.25 mg (09/20/2019), 0.25 mg (10/04/2019), 0.25 mg (10/16/2019), 0.25 mg (10/30/2019), 0.25 mg (11/22/2019),  0.25 mg (12/06/2019), 0.25 mg (12/20/2019), 0.25 mg (01/03/2020), 0.25 mg (01/29/2020), 0.25 mg (02/12/2020), 0.25 mg (02/26/2020), 0.25 mg (03/11/2020), 0.25 mg (03/25/2020), 0.25 mg (04/08/2020), 0.25 mg (04/24/2020), 0.25 mg (05/08/2020), 0.25 mg (05/22/2020), 0.25 mg (06/10/2020), 0.25 mg (06/25/2020) leucovorin 800 mg in dextrose 5 % 250 mL infusion, 844 mg, Intravenous,  Once, 23 of 25 cycles Administration: 800 mg (07/24/2019), 800 mg (08/07/2019), 800 mg (08/21/2019), 800 mg (09/04/2019), 800 mg (09/20/2019), 800 mg (10/04/2019), 800 mg (10/16/2019), 800 mg (10/30/2019), 800 mg (11/22/2019), 800 mg (12/06/2019), 800 mg (12/20/2019), 800 mg (01/03/2020), 800 mg (01/29/2020), 800 mg (02/12/2020), 800 mg (02/26/2020), 800 mg (03/11/2020), 800 mg (03/25/2020), 800 mg (04/08/2020), 800 mg (04/24/2020), 800 mg (05/08/2020), 800 mg (05/22/2020), 800 mg (06/10/2020), 800 mg (06/25/2020) oxaliplatin (ELOXATIN) 180 mg in dextrose 5 % 500 mL chemo infusion, 85 mg/m2 = 180 mg, Intravenous,  Once, 23 of 25 cycles Dose modification: 178 mg (original dose 85 mg/m2, Cycle 17, Reason: Other (see comments), Comment: insurance adjusted dose ) Administration: 180 mg (07/24/2019), 180 mg (08/07/2019), 180 mg (08/21/2019), 180 mg (09/04/2019), 180 mg (09/20/2019), 180 mg (10/04/2019), 180 mg (10/16/2019), 180 mg (10/30/2019), 180 mg (11/22/2019), 180 mg (12/06/2019), 180 mg (12/20/2019), 180 mg (01/03/2020), 180 mg (01/29/2020), 180 mg (02/12/2020), 180 mg (02/26/2020), 180 mg (03/11/2020), 180 mg (04/08/2020), 180 mg (04/24/2020), 180 mg (05/08/2020), 180 mg (05/22/2020), 180 mg (06/10/2020), 180 mg (06/25/2020) fluorouracil (ADRUCIL) 5,000 mg in sodium chloride 0.9 % 150 mL chemo infusion, 5,050 mg, Intravenous, 1 Day/Dose, 23 of 25 cycles Administration: 5,000 mg (07/24/2019), 5,000 mg (08/07/2019), 5,000 mg (08/21/2019), 5,050 mg (09/04/2019), 5,000 mg (10/04/2019), 5,000 mg (10/16/2019), 5,000 mg (11/22/2019), 5,000 mg (12/06/2019), 5,000 mg (12/20/2019), 5,000 mg (01/03/2020), 5,000 mg  (01/29/2020), 5,000 mg (02/12/2020), 5,000 mg (02/26/2020), 5,000 mg (03/11/2020), 5,000 mg (03/25/2020), 5,000 mg (04/08/2020), 5,000 mg (04/24/2020), 5,000 mg (05/08/2020), 5,000 mg (05/22/2020), 5,000 mg (06/10/2020), 5,000 mg (06/25/2020) bevacizumab-bvzr (ZIRABEV) 400 mg in sodium chloride 0.9 % 100 mL chemo infusion, 5 mg/kg = 400 mg, Intravenous,  Once, 23 of 25 cycles Administration: 400 mg (08/07/2019), 400 mg (08/21/2019), 400 mg (09/04/2019), 400 mg (09/20/2019), 400 mg (10/04/2019), 400 mg (10/16/2019), 400 mg (10/30/2019), 400 mg (11/22/2019), 400 mg (12/06/2019), 400 mg (12/20/2019), 400 mg (01/03/2020), 400 mg (01/29/2020), 400 mg (02/12/2020), 400 mg (02/26/2020), 400 mg (03/11/2020), 400 mg (03/25/2020), 400 mg (04/08/2020), 400 mg (04/24/2020), 400 mg (05/08/2020), 400 mg (05/22/2020), 400 mg (06/10/2020), 400 mg (06/25/2020)  for chemotherapy treatment.    07/08/2020 -  Chemotherapy    Patient is on Treatment Plan: COLORECTAL FOLFIRI / BEVACIZUMAB Q14D      11/05/2020 Cancer Staging   Staging form: Colon and Rectum, AJCC 8th Edition - Clinical: Stage IVC (pM1c) - Signed by Cammie Sickle, MD on 11/05/2020     ALLERGIES:  is allergic to ace inhibitors.  MEDICATIONS:  Current Outpatient Medications  Medication Sig Dispense Refill  . amLODipine (NORVASC) 5 MG tablet Take 5 mg by mouth daily.    . meloxicam (MOBIC) 15 MG tablet Take 1 tablet (15 mg total) by mouth daily. 90 tablet 0  . nystatin (MYCOSTATIN) 100000 UNIT/ML suspension Take 5 mLs (500,000 Units total) by mouth 4 (four) times daily. 60 mL 0  . ondansetron (ZOFRAN ODT) 8 MG  disintegrating tablet Take 1 tablet (8 mg total) by mouth every 8 (eight) hours as needed for nausea or vomiting. 45 tablet 0  . pravastatin (PRAVACHOL) 40 MG tablet Take 40 mg by mouth daily.     . tamsulosin (FLOMAX) 0.4 MG CAPS capsule Take 1 capsule (0.4 mg total) by mouth daily. 30 capsule 11   No current facility-administered medications for this visit.    Facility-Administered Medications Ordered in Other Visits  Medication Dose Route Frequency Provider Last Rate Last Admin  . heparin lock flush 100 unit/mL  500 Units Intracatheter Once PRN Cammie Sickle, MD        VITAL SIGNS: There were no vitals taken for this visit. There were no vitals filed for this visit.  Estimated body mass index is 23.87 kg/m as calculated from the following:   Height as of 11/19/20: 6' (1.829 m).   Weight as of 11/19/20: 176 lb (79.8 kg).  LABS: CBC:    Component Value Date/Time   WBC 4.1 11/19/2020 0843   HGB 11.7 (L) 11/19/2020 0843   HGB 13.3 09/18/2013 0424   HCT 34.4 (L) 11/19/2020 0843   HCT 38.1 (L) 09/18/2013 0424   PLT 158 11/19/2020 0843   PLT 307 09/18/2013 0424   MCV 100.0 11/19/2020 0843   MCV 94 09/18/2013 0424   NEUTROABS 2.3 11/19/2020 0843   NEUTROABS 17.0 (H) 09/18/2013 0424   LYMPHSABS 0.7 11/19/2020 0843   LYMPHSABS 0.9 (L) 09/18/2013 0424   MONOABS 0.6 11/19/2020 0843   MONOABS 0.4 09/18/2013 0424   EOSABS 0.5 11/19/2020 0843   EOSABS 0.0 09/18/2013 0424   BASOSABS 0.0 11/19/2020 0843   BASOSABS 0.0 09/18/2013 0424   Comprehensive Metabolic Panel:    Component Value Date/Time   NA 138 11/19/2020 0843   NA 137 09/18/2013 0424   K 3.8 11/19/2020 0843   K 4.5 09/18/2013 0424   CL 106 11/19/2020 0843   CL 107 09/18/2013 0424   CO2 23 11/19/2020 0843   CO2 28 09/18/2013 0424   BUN 8 11/19/2020 0843   BUN 19 (H) 09/18/2013 0424   CREATININE 0.97 11/19/2020 0843   CREATININE 1.15 09/18/2013 0424   GLUCOSE 199 (H) 11/19/2020 0843   GLUCOSE 196 (H) 09/18/2013 0424   CALCIUM 8.2 (L) 11/19/2020 0843   CALCIUM 9.1 09/18/2013 0424   AST 31 11/19/2020 0843   ALT 14 11/19/2020 0843   ALKPHOS 107 11/19/2020 0843   BILITOT 0.6 11/19/2020 0843   PROT 6.2 (L) 11/19/2020 0843   ALBUMIN 2.6 (L) 11/19/2020 0843    RADIOGRAPHIC STUDIES: DG Abdomen 1 View  Result Date: 11/08/2020 CLINICAL DATA:  Constipation EXAM:  ABDOMEN - 1 VIEW COMPARISON:  11/07/2020 FINDINGS: The bowel gas pattern is normal. No radio-opaque calculi or other significant radiographic abnormality are seen. Bowel staples right abdomen. IMPRESSION: Negative. Electronically Signed   By: Franchot Gallo M.D.   On: 11/08/2020 07:59   CT HEAD WO CONTRAST  Result Date: 11/09/2020 CLINICAL DATA:  80 year old male with history of dizziness. EXAM: CT HEAD WITHOUT CONTRAST TECHNIQUE: Contiguous axial images were obtained from the base of the skull through the vertex without intravenous contrast. COMPARISON:  Head CT 03/06/2016. FINDINGS: Brain: Mild cerebral atrophy. Patchy and confluent areas of decreased attenuation are noted throughout the deep and periventricular white matter of the cerebral hemispheres bilaterally, compatible with chronic microvascular ischemic disease. Well-defined areas of low attenuation are noted in the basal ganglia bilaterally, most evident in the head of the left  caudate nucleus, indicative of old lacunar infarcts. No evidence of acute infarction, hemorrhage, hydrocephalus, extra-axial collection or mass lesion/mass effect. Vascular: No hyperdense vessel or unexpected calcification. Skull: Normal. Negative for fracture or focal lesion. Sinuses/Orbits: No acute finding. Other: None. IMPRESSION: 1. No acute intracranial abnormalities. 2. Mild cerebral atrophy with chronic microvascular ischemic changes in the cerebral white matter and old lacunar infarcts in the basal ganglia bilaterally, as above. Electronically Signed   By: Vinnie Langton M.D.   On: 11/09/2020 13:36   CT ABDOMEN PELVIS W CONTRAST  Result Date: 11/08/2020 CLINICAL DATA:  Lower abdominal pain, nausea and vomiting, and constipation for 4 days. Metastatic colon carcinoma, currently undergoing chemotherapy. EXAM: CT ABDOMEN AND PELVIS WITH CONTRAST TECHNIQUE: Multidetector CT imaging of the abdomen and pelvis was performed using the standard protocol following bolus  administration of intravenous contrast. CONTRAST:  175mL OMNIPAQUE IOHEXOL 300 MG/ML  SOLN COMPARISON:  09/24/2020 FINDINGS: Lower Chest: New atelectasis is seen in the posteromedial left lower lobe which partially obscures a 9 mm pulmonary nodule which was more clearly demonstrated on recent CT. New mild patchy infiltrate is seen in the inferior lingula. A new ill-defined nodular opacity is seen in the posterior right lower lobe which measures 10 mm on image 4/4, and this could represent infectious or inflammatory etiology or metastasis. Hepatobiliary: A hypovascular mass in the lateral segment of the left lobe currently measures 4.5 x 4.4 cm, compared to 3.9 x 3.6 cm previously. A smaller low-attenuation lesion in the posterior right hepatic lobe currently measures 1.4 cm compared to 0.8 cm previously. A large cyst in the anterior segment of the right lobe is unchanged. Gallbladder is unremarkable. Mild diffuse biliary ductal dilatation remains stable. Pancreas: No mass or inflammatory changes. No evidence of pancreatic ductal dilatation. Spleen: Within normal limits in size and appearance. Adrenals/Urinary Tract: No masses identified. Left lower pole renal cyst shows no significant change. No evidence of ureteral calculi or hydronephrosis. Stomach/Bowel: No evidence of obstruction, inflammatory process or abnormal fluid collections. Surgical clips again seen in the right colon. A soft tissue nodule or lymph node is again seen adjacent to the surgical clips, which measures 1.7 x 1.5 cm and remains stable since previous study. Vascular/Lymphatic: Stable 1.7 cm right pericolonic lymph node or soft tissue density, as noted above. No other sites of lymphadenopathy identified. No abdominal aortic aneurysm. Aortic atherosclerotic calcification noted. Reproductive:  No mass or other significant abnormality. Other:  None. Musculoskeletal:  No suspicious bone lesions identified. IMPRESSION: Mild progression of liver  metastases since prior exam. Stable 1.7 cm soft tissue nodule or lymph node adjacent to surgical clips in right colon. New atelectasis in posteromedial left lower lobe, which obscures a 9 mm pulmonary metastasis in this region on recent CT. New 1 cm ill-defined posterior right lower lobe nodule is nonspecific, and could represent infectious or inflammatory etiology or metastasis. Electronically Signed   By: Marlaine Hind M.D.   On: 11/08/2020 10:33   DG Chest Portable 1 View  Result Date: 11/08/2020 CLINICAL DATA:  Shortness of breath.  COVID in chemotherapy patient. EXAM: PORTABLE CHEST 1 VIEW COMPARISON:  09/24/2020 chest CT FINDINGS: Porta catheter with tip at the upper right atrium. Normal heart size and mediastinal contours. 13 mm nodule over the left mid lung. Linear opacity at the left base compatible with atelectasis. There is known pulmonary metastatic disease. IMPRESSION: No visible pneumonia. Electronically Signed   By: Monte Fantasia M.D.   On: 11/08/2020 08:46   DG  Abd 2 Views  Result Date: 11/18/2020 CLINICAL DATA:  Diarrhea for 1.5 weeks. Abdominal pain for 2.5 weeks. Colon cancer. History of constipation. EXAM: ABDOMEN - 2 VIEW COMPARISON:  11/08/2020 CT FINDINGS: Upright and supine views. The upright view demonstrates no free intraperitoneal air or significant air-fluid levels. Supine views demonstrate no gaseous distention of bowel loops. Surgical sutures in the right side of the abdomen. No abnormal abdominal calcifications. No appendicolith. IMPRESSION: No acute findings. Electronically Signed   By: Abigail Miyamoto M.D.   On: 11/18/2020 16:46   DG Abd 2 Views  Result Date: 11/07/2020 CLINICAL DATA:  History of abdominal pain and bloating for 2 days, history of colon carcinoma with metastatic disease EXAM: ABDOMEN - 2 VIEW COMPARISON:  CT from 09/24/2020 FINDINGS: Scattered large and small bowel gas is noted. No free air is seen. No abnormal mass or abnormal calcifications are noted.  Postsurgical changes in the right colon are noted. Degenerative changes of lumbar spine are noted. IMPRESSION: No acute abnormality noted. Electronically Signed   By: Inez Catalina M.D.   On: 11/07/2020 15:43    PERFORMANCE STATUS (ECOG) : 1 - Symptomatic but completely ambulatory  Review of Systems Unless otherwise noted, a complete review of systems is negative.  Physical Exam General: NAD, frail appearing, thin Pulmonary: Unlabored Extremities: no edema, no joint deformities Skin: no rashes Neurological: Weakness but otherwise nonfocal  IMPRESSION: Patient was an add-on to my clinic schedule today due to pain.  He was seen while receiving IV fluids.  Patient has had poor tolerance to systemic treatment and is recently on chemo holiday.  He was also felt to have disease progression.    Patient reports that he had persistent abdominal pain over the past month.  Pain is diffuse throughout the abdomen.  He has been taking tramadol which he finds helps but does not reduce the pain to a tolerable level.  Pain is keeping him awake at night.  Will trial oxycodone.  Patient also has nausea since his last treatment 1.5 weeks ago.  No vomiting.  No fever or chills.  Patient states that he was constipated and then took MiraLAX and Senokot and this relieved his constipation.  However, he has had some residual nausea since.  He is taking ondansetron for the nausea.  Will trial Compazine.  PLAN: -Continue current scope of treatment -Start oxycodone 5 mg every 6 hours as needed -Daily bowel regimen -Continue ondansetron as needed -Start Compazine 10 mg every 6 hours as needed for nausea -Follow-up MyChart visit 2 weeks   Patient expressed understanding and was in agreement with this plan. He also understands that He can call the clinic at any time with any questions, concerns, or complaints.     Time Total: 15 minutes  Visit consisted of counseling and education dealing with the complex and  emotionally intense issues of symptom management and palliative care in the setting of serious and potentially life-threatening illness.Greater than 50%  of this time was spent counseling and coordinating care related to the above assessment and plan.  Signed by: Altha Harm, PhD, NP-C

## 2020-11-21 NOTE — Progress Notes (Signed)
Here for IVF . States he has intermittent nausea. Has not picked up his zofran from pharmacy. Encouraged him to do so. Stools are not all liquid now starting to turni semi soft from taking several doses of  miralax for bad constipation. Requesting something for pain. States he had some tramadol at home which is not really helping. I requested [name] from palliative care to see pt today if he was able. Discharged from clinic. VSS.

## 2020-11-26 ENCOUNTER — Ambulatory Visit
Admission: RE | Admit: 2020-11-26 | Discharge: 2020-11-26 | Disposition: A | Discharge status: Home / Self Care | Payer: Medicare HMO | Source: Ambulatory Visit | Attending: Internal Medicine | Admitting: Internal Medicine

## 2020-11-26 ENCOUNTER — Other Ambulatory Visit: Payer: Self-pay

## 2020-11-26 DIAGNOSIS — C189 Malignant neoplasm of colon, unspecified: Secondary | ICD-10-CM | POA: Diagnosis not present

## 2020-11-26 DIAGNOSIS — K269 Duodenal ulcer, unspecified as acute or chronic, without hemorrhage or perforation: Secondary | ICD-10-CM | POA: Diagnosis not present

## 2020-11-26 DIAGNOSIS — K769 Liver disease, unspecified: Secondary | ICD-10-CM | POA: Insufficient documentation

## 2020-11-26 DIAGNOSIS — K2980 Duodenitis without bleeding: Secondary | ICD-10-CM | POA: Diagnosis not present

## 2020-11-26 DIAGNOSIS — C787 Secondary malignant neoplasm of liver and intrahepatic bile duct: Secondary | ICD-10-CM | POA: Diagnosis not present

## 2020-11-26 IMAGING — MR MR ABDOMEN WO/W CM
17 series · 48 of 48 positions shown · IV contrast (gadavist)
Comparison: CT on [DATE]

CLINICAL DATA: Metastatic colon carcinoma. Undergoing chemotherapy.

EXAM:
MRI ABDOMEN WITHOUT AND WITH CONTRAST
TECHNIQUE: Multiplanar multisequence MR imaging of the abdomen was performed
both before and after the administration of intravenous contrast.
CONTRAST:  7mL GADAVIST GADOBUTROL 1 MMOL/ML IV SOLN

[Series 3: T2 · coronal · 6.0mm · 1.19mm/px · 2 of 32 slices shown (1 of 2)]
[im 1/32]
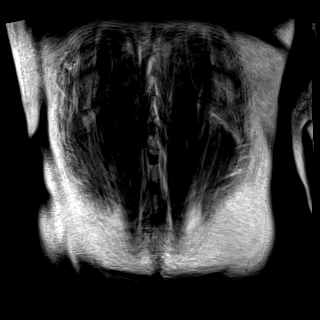
[im 32/32]
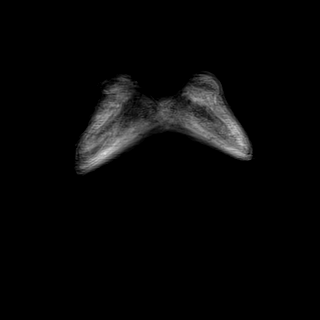

[Series 4: T2 · axial · 6.0mm · 1.19mm/px · z∈[-138,+107]mm · 2 of 35 slices shown (2 of 2)]
[im 1/35]
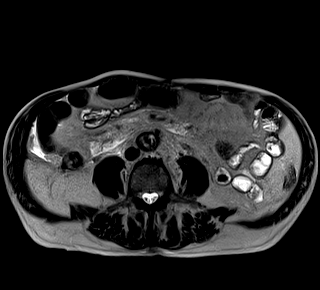
[im 35/35]
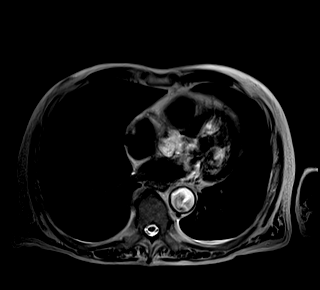

[Series 6: T2 fat-sat · axial · 6.0mm · 1.19mm/px · z∈[-138,+107]mm · 2 of 35 slices shown]
[im 1/35]
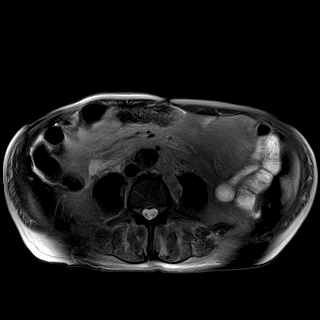
[im 35/35]
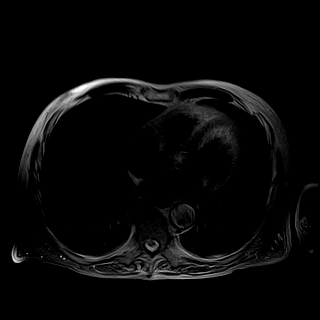

[Series 7: ax dwi_tracew · axial · 6.0mm · 1.42mm/px · z∈[-141,+111]mm · 4 of 108 slices shown]
[im 1/108]
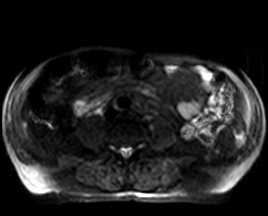
[im 36/108]
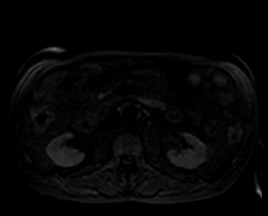
[im 72/108]
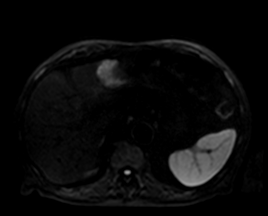
[im 108/108]
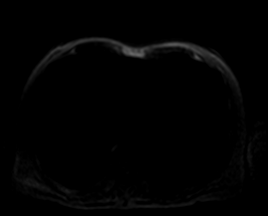

[Series 8: ax dwi_adc · axial · 6.0mm · 1.42mm/px · 1 of 36 slices shown]
[im 1/36]
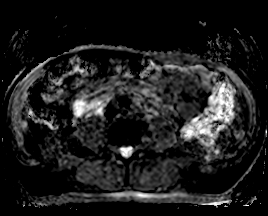

[Series 9: ax in & · axial · 3.5mm · 1.19mm/px · z∈[-140,+109]mm · 6 of 144 slices shown]
[im 1/144]
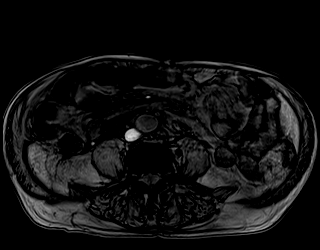
[im 29/144]
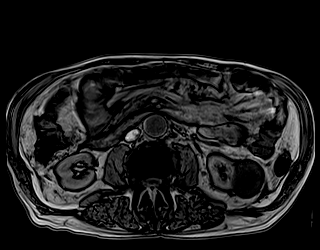
[im 58/144]
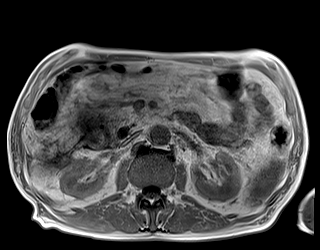
[im 86/144]
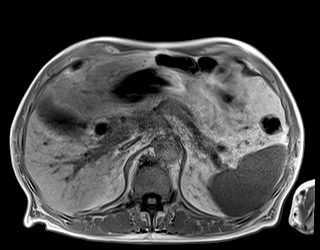
[im 115/144]
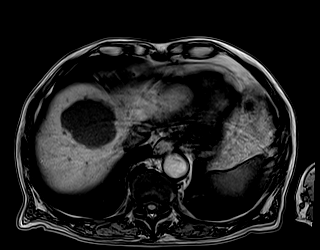
[im 144/144]
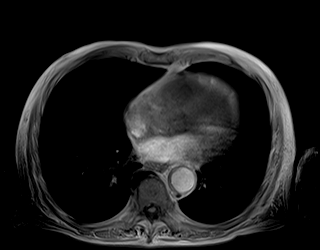

[Series 10: bSSFP · axial · 6.0mm · 0.74mm/px · 1 of 35 slices shown]
[im 1/35]
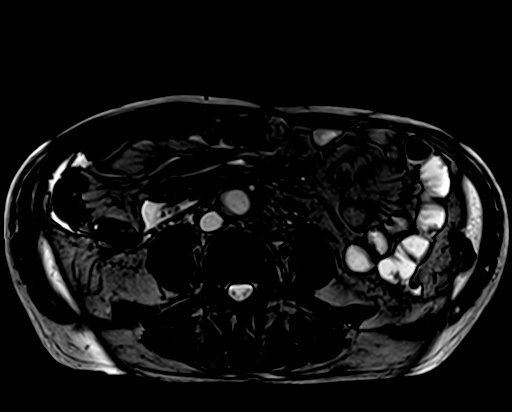

[Series 11: T1 dynamic fat-sat · axial · non-contrast · 3.5mm · 1.19mm/px · z∈[-154,+123]mm · 3 of 80 slices shown (1 of 5)]
[im 1/80]
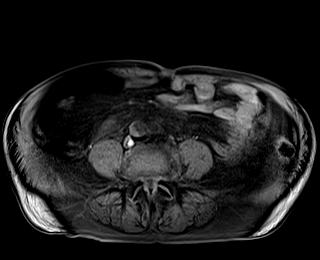
[im 40/80]
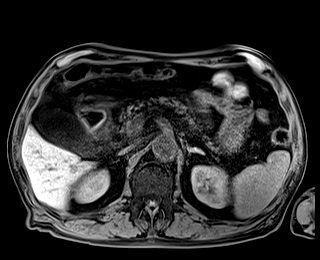
[im 80/80]
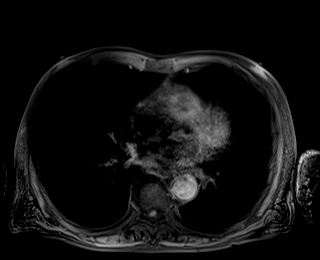

[Series 12: T1 dynamic fat-sat post-contrast · axial · 3.5mm · 1.19mm/px · z∈[-154,+123]mm · 3 of 80 slices shown (1 of 4)]
[im 1/80]
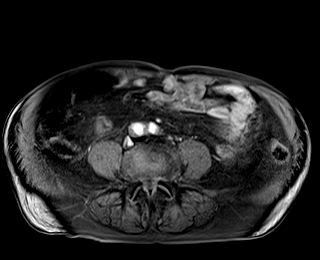
[im 40/80]
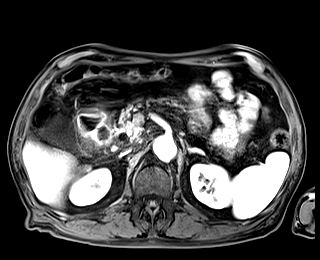
[im 80/80]
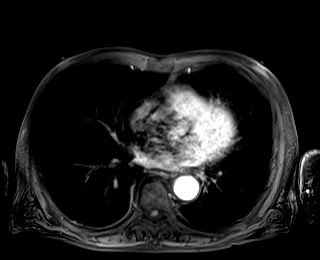

[Series 13: T1 dynamic fat-sat · axial · 3.5mm · 1.19mm/px · z∈[-154,+123]mm · 3 of 80 slices shown (2 of 5)]
[im 1/80]
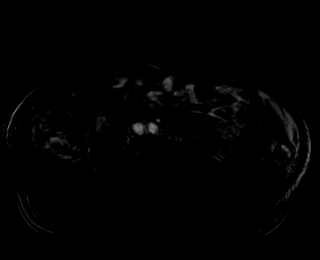
[im 40/80]
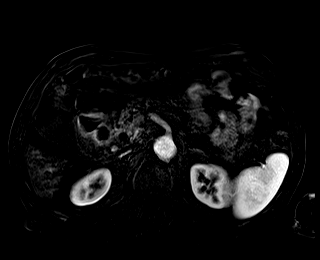
[im 80/80]
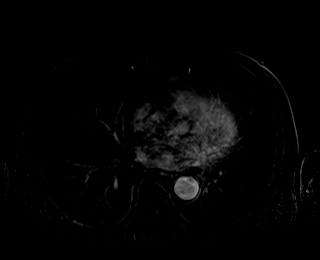

[Series 14: T1 dynamic fat-sat post-contrast · axial · 3.5mm · 1.19mm/px · z∈[-154,+123]mm · 3 of 80 slices shown (2 of 4)]
[im 1/80]
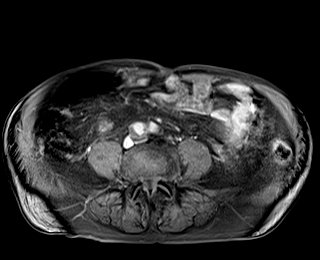
[im 40/80]
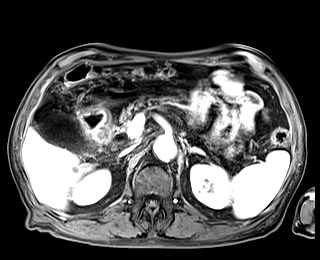
[im 80/80]
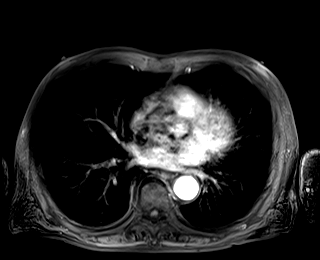

[Series 15: T1 dynamic fat-sat · axial · 3.5mm · 1.19mm/px · z∈[-154,+123]mm · 3 of 80 slices shown (3 of 5)]
[im 1/80]
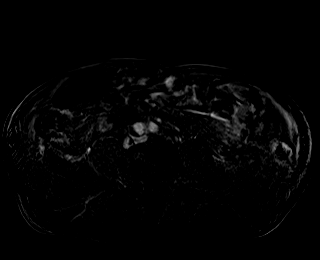
[im 40/80]
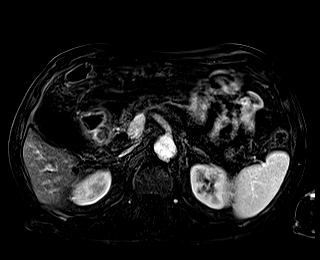
[im 80/80]
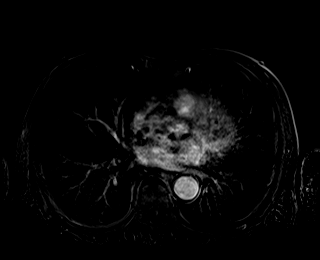

[Series 16: T1 dynamic fat-sat post-contrast · axial · 3.5mm · 1.19mm/px · z∈[-154,+123]mm · 3 of 80 slices shown (3 of 4)]
[im 1/80]
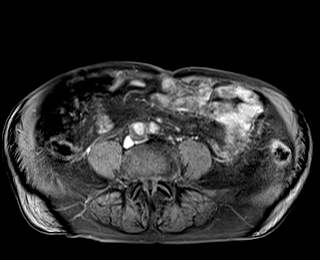
[im 40/80]
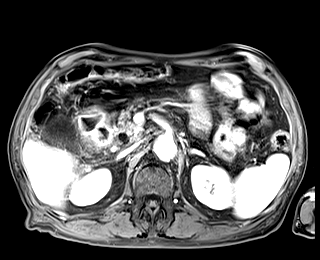
[im 80/80]
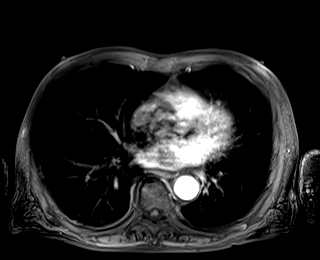

[Series 17: T1 dynamic fat-sat · axial · 3.5mm · 1.19mm/px · z∈[-154,+123]mm · 3 of 80 slices shown (4 of 5)]
[im 1/80]
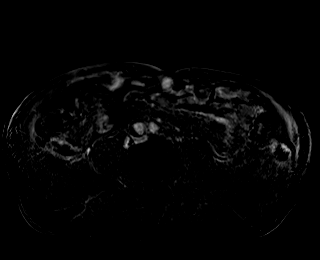
[im 40/80]
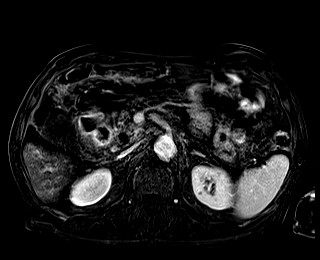
[im 80/80]
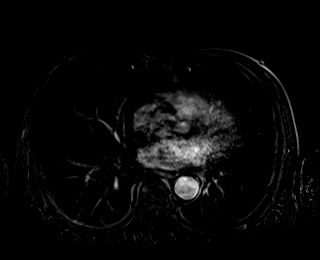

[Series 18: T1 dynamic post-contrast · coronal · 3.0mm · 1.31mm/px · 3 of 72 slices shown]
[im 1/72]
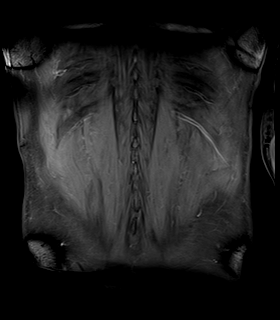
[im 36/72]
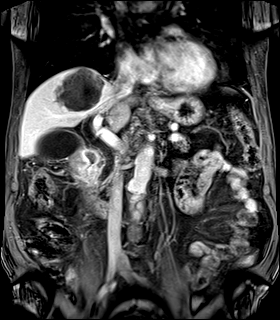
[im 72/72]
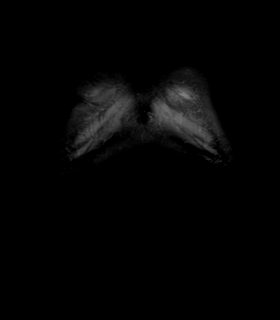

[Series 19: T1 dynamic fat-sat post-contrast · axial · 3.5mm · 1.19mm/px · z∈[-154,+123]mm · 3 of 80 slices shown (4 of 4)]
[im 1/80]
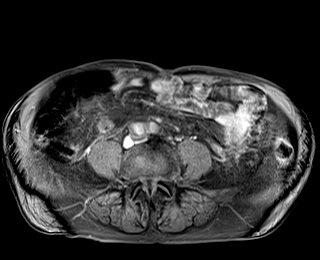
[im 40/80]
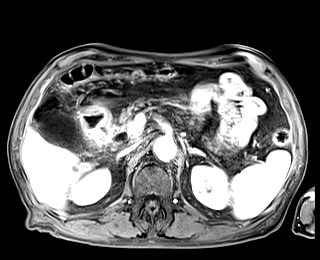
[im 80/80]
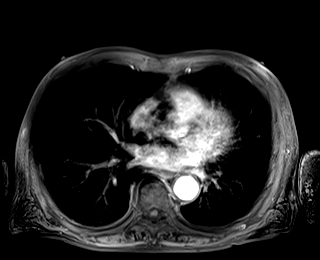

[Series 20: T1 dynamic fat-sat · axial · 3.5mm · 1.19mm/px · z∈[-154,+123]mm · 3 of 80 slices shown (5 of 5)]
[im 1/80]
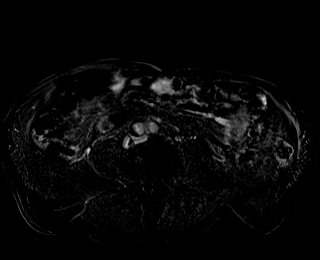
[im 40/80]
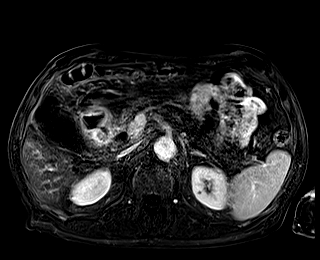
[im 80/80]
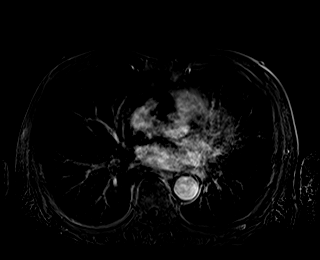

[48 of 48 positions shown; findings below may reference images not displayed]

FINDINGS: Lower chest: A sub-cm T2 hypointense nodule is seen in the posterior
left lower lobe, which appears decreased in size since previous
study.

Hepatobiliary: A hypovascular mass is again seen in segments [DATE]
measuring 4.4 x 4.2 cm on image [DATE]. A smaller hypovascular lesion
measuring 1.2 cm is seen in segment 6 on image 40/14. These are
consistent with liver metastases and show no significant change in
size compared to recent CT. A 7 cm benign-appearing cyst is again
seen in the liver dome, also without change.

Gallbladder is unremarkable. Diffuse biliary ductal dilatation is
again seen with common bile duct measuring 1.2 cm. This is also
unchanged, and there is no evidence of choledocholithiasis or other
obstructing etiology.

Pancreas: No mass or inflammatory changes. No evidence of pancreatic
ductal dilatation.

Spleen:  Within normal limits in size and appearance.

Adrenals/Urinary Tract: No masses identified. Simple left renal cyst
again seen measuring approximately 4 cm. No evidence of
hydronephrosis.

Stomach/Bowel: Wall thickening and adjacent retroperitoneal edema is
now seen involving the duodenal bulb and 2nd portion of the
duodenum. An ulcer crater containing fluid and gas is also seen in
the medial wall of the post bulbar duodenum measuring approximately
1.5 cm.

Vascular/Lymphatic: No pathologically enlarged lymph nodes
identified. No abdominal aortic aneurysm.

Other: Tiny amount of free fluid and right paracolic gutter and more
since pouch.

Musculoskeletal:  No suspicious bone lesions identified.
IMPRESSION: Stable hypovascular liver metastases. No new or progressive
metastatic disease identified.

Ulcer involving the post-bulbar duodenum, with adjacent
retroperitoneal inflammatory changes. These results will be called
to the ordering clinician or representative by the Radiologist
Assistant, and communication documented in the PACS or [REDACTED].

No significant change in diffuse biliary ductal dilatation. No
radiographic evidence of choledocholithiasis. This could be due
edema involving the descending duodenum secondary to the ulcer
described above.

Sub-cm nodule in the posterior left lower lobe, decreased in size
since previous study.

## 2020-11-26 MED ORDER — GADOBUTROL 1 MMOL/ML IV SOLN
7.0000 mL | Freq: Once | INTRAVENOUS | Status: AC | PRN
  Administered 2020-11-26: 7 mL via INTRAVENOUS

## 2020-11-27 ENCOUNTER — Telehealth: Payer: Self-pay | Admitting: *Deleted

## 2020-11-27 ENCOUNTER — Encounter: Payer: Self-pay | Admitting: Internal Medicine

## 2020-11-27 DIAGNOSIS — K269 Duodenal ulcer, unspecified as acute or chronic, without hemorrhage or perforation: Secondary | ICD-10-CM

## 2020-11-27 MED ORDER — PANTOPRAZOLE SODIUM 40 MG PO TBEC: 40.0000 mg | DELAYED_RELEASE_TABLET | Freq: Two times a day (BID) | ORAL | 3 refills | Status: DC

## 2020-11-27 MED ORDER — SUCRALFATE 1 G PO TABS: 1.0000 g | ORAL_TABLET | Freq: Three times a day (TID) | ORAL | 3 refills | Status: DC

## 2020-11-27 NOTE — Telephone Encounter (Signed)
Called report IMPRESSION: Stable hypovascular liver metastases. No new or progressive metastatic disease identified.  Ulcer involving the post-bulbar duodenum, with adjacent retroperitoneal inflammatory changes. These results will be called to the ordering clinician or representative by the Radiologist Assistant, and communication documented in the PACS or [employer].  No significant change in diffuse biliary ductal dilatation. No radiographic evidence of choledocholithiasis. This could be due edema involving the descending duodenum secondary to the ulcer described above.  Sub-cm nodule in the posterior left lower lobe, decreased in size since previous study.   Electronically Signed   By: Marlaine Hind M.D.   On: 11/27/2020 10:58

## 2020-11-27 NOTE — Addendum Note (Signed)
Addended by: Gloris Ham on: 11/27/2020 12:19 PM   Modules accepted: Orders

## 2020-11-27 NOTE — Telephone Encounter (Signed)
Per v/o Dr. Rogue Bussing. Prescription sent for Protonix 40 mg twice daily and Carafate 1 g tablet four times a day with meals at bedtime. Referral to GI asap.  GI referral initiated to Dr. Vicente Males for Duodenum ulcer. Prescriptions sent to the pharmacy.  Attempted to reach patient via phone. I left vm for patient to return my phone call. I discussed plan of care with patient's Daughter, Velta Addison. Daughter gave verbal understanding of the plan of care. She would like to be contacted with the GI apts.    Daughter wanted "to discuss care with Dr. B and inquire if the cancer treatment contributed to the gastric ulcer. Patient nephew also wants patient to start a tablet supplement made of mushrooms." patient  daughter will send details on supplements via mychart to Dr. B to review. She also inquired if patient can start back on his chemotherapy. Dr. Jacinto Reap - Please address patient's family concerns.

## 2020-11-28 ENCOUNTER — Telehealth: Payer: Self-pay | Admitting: Internal Medicine

## 2020-11-28 ENCOUNTER — Other Ambulatory Visit: Payer: Medicare HMO

## 2020-11-28 NOTE — Telephone Encounter (Signed)
On 2/09-I called patient's daughter spoke to her regarding results of the MRI.  Concern for duodenal ulcer-recommend continued PPI; Carafate as ordered.  Await GI evaluation; discussed possible need for EGD.  Continue to hold therapy for now.  Recommend keep appointment/follow-up as planned.  Plan hold treatment for now.

## 2020-11-28 NOTE — Progress Notes (Signed)
Tumor Board Documentation  Oscar [name] was presented by Dr Rogue Bussing at our Tumor Board on 11/28/2020, which included representatives from medical oncology,radiation oncology,surgical,radiology,pathology,navigation,internal medicine,palliative care,research,genetics,pharmacy,pulmonology.  Oscar [name] currently presents as a current patient,for discussion with history of the following treatments: active survellience,adjuvant chemotherapy.  Additionally, we reviewed previous medical and familial history, history of present illness, and recent lab results along with all available histopathologic and imaging studies. The tumor board considered available treatment options and made the following recommendations:   Y 90 then possible ablation to larger liver lesion. Observation vs ablation to the smaller lesion  The following procedures/referrals were also placed: No orders of the defined types were placed in this encounter.   Clinical Trial Status: not discussed   Staging used: AJCC Stage Group  AJCC Staging:       Group: Stage IV C Colon Cancer   National site-specific guidelines NCCN were discussed with respect to the case.  Tumor board is a meeting of clinicians from various specialty areas who evaluate and discuss patients for whom a multidisciplinary approach is being considered. Final determinations in the plan of care are those of the provider(s). The responsibility for follow up of recommendations given during tumor board is that of the provider.   Today's extended care, comprehensive team conference, Oscar [name] was not present for the discussion and was not examined.   Multidisciplinary Tumor Board is a multidisciplinary case peer review process.  Decisions discussed in the Multidisciplinary Tumor Board reflect the opinions of the specialists present at the conference without having examined the patient.  Ultimately, treatment and diagnostic decisions rest with the primary provider(s)  and the patient.

## 2020-11-28 NOTE — Telephone Encounter (Signed)
Thanks, Dr.Anna for working this pt in. Appreciate your timely help. GB

## 2020-11-28 NOTE — Telephone Encounter (Signed)
Oscar [name]  I can see this patient next Wednesday, my schedule has been opened up for new patients in the afternoon

## 2020-12-02 ENCOUNTER — Inpatient Hospital Stay (HOSPITAL_BASED_OUTPATIENT_CLINIC_OR_DEPARTMENT_OTHER): Payer: Medicare HMO | Admitting: Hospice and Palliative Medicine

## 2020-12-02 DIAGNOSIS — Z515 Encounter for palliative care: Secondary | ICD-10-CM

## 2020-12-02 NOTE — Progress Notes (Signed)
Was unable to reach patient for scheduled MyChart visit.  Voicemail left.  Will reschedule. 

## 2020-12-03 ENCOUNTER — Inpatient Hospital Stay: Payer: Medicare HMO | Admitting: Internal Medicine

## 2020-12-03 ENCOUNTER — Inpatient Hospital Stay: Payer: Medicare HMO

## 2020-12-03 ENCOUNTER — Encounter: Payer: Self-pay | Admitting: Internal Medicine

## 2020-12-03 DIAGNOSIS — R110 Nausea: Secondary | ICD-10-CM | POA: Diagnosis not present

## 2020-12-03 DIAGNOSIS — C787 Secondary malignant neoplasm of liver and intrahepatic bile duct: Secondary | ICD-10-CM | POA: Diagnosis not present

## 2020-12-03 DIAGNOSIS — C182 Malignant neoplasm of ascending colon: Secondary | ICD-10-CM | POA: Diagnosis not present

## 2020-12-03 DIAGNOSIS — F17210 Nicotine dependence, cigarettes, uncomplicated: Secondary | ICD-10-CM | POA: Diagnosis not present

## 2020-12-03 DIAGNOSIS — I959 Hypotension, unspecified: Secondary | ICD-10-CM | POA: Diagnosis not present

## 2020-12-03 DIAGNOSIS — Z515 Encounter for palliative care: Secondary | ICD-10-CM | POA: Diagnosis not present

## 2020-12-03 DIAGNOSIS — R109 Unspecified abdominal pain: Secondary | ICD-10-CM | POA: Diagnosis not present

## 2020-12-03 LAB — COMPREHENSIVE METABOLIC PANEL
ALT: 19 U/L (ref 0–44)
AST: 35 U/L (ref 15–41)
Albumin: 2.3 g/dL — ABNORMAL LOW (ref 3.5–5.0)
Alkaline Phosphatase: 109 U/L (ref 38–126)
Anion gap: 7 (ref 5–15)
BUN: 8 mg/dL (ref 8–23)
CO2: 25 mmol/L (ref 22–32)
Calcium: 8 mg/dL — ABNORMAL LOW (ref 8.9–10.3)
Chloride: 109 mmol/L (ref 98–111)
Creatinine, Ser: 0.81 mg/dL (ref 0.61–1.24)
GFR, Estimated: >60 mL/min (ref >60)
Glucose, Bld: 155 mg/dL — ABNORMAL HIGH (ref 70–99)
Potassium: 3.8 mmol/L (ref 3.5–5.1)
Sodium: 141 mmol/L (ref 135–145)
Total Bilirubin: 0.8 mg/dL (ref 0.3–1.2)
Total Protein: 6.1 g/dL — ABNORMAL LOW (ref 6.5–8.1)

## 2020-12-03 LAB — CBC WITH DIFFERENTIAL/PLATELET
Abs Immature Granulocytes: 0 10*3/uL (ref 0.00–0.07)
Basophils Absolute: 0.1 10*3/uL (ref 0.0–0.1)
Basophils Relative: 2 %
Eosinophils Absolute: 0.2 10*3/uL (ref 0.0–0.5)
Eosinophils Relative: 5 %
HCT: 34.9 % — ABNORMAL LOW (ref 39.0–52.0)
Hemoglobin: 11.8 g/dL — ABNORMAL LOW (ref 13.0–17.0)
Immature Granulocytes: 0 %
Lymphocytes Relative: 27 %
Lymphs Abs: 1.2 10*3/uL (ref 0.7–4.0)
MCH: 34.1 pg — ABNORMAL HIGH (ref 26.0–34.0)
MCHC: 33.8 g/dL (ref 30.0–36.0)
MCV: 100.9 fL — ABNORMAL HIGH (ref 80.0–100.0)
Monocytes Absolute: 0.5 10*3/uL (ref 0.1–1.0)
Monocytes Relative: 12 %
Neutro Abs: 2.4 10*3/uL (ref 1.7–7.7)
Neutrophils Relative %: 54 %
Platelets: 147 10*3/uL — ABNORMAL LOW (ref 150–400)
RBC: 3.46 MIL/uL — ABNORMAL LOW (ref 4.22–5.81)
RDW: 15.9 % — ABNORMAL HIGH (ref 11.5–15.5)
WBC: 4.4 10*3/uL (ref 4.0–10.5)
nRBC: 0 % (ref 0.0–0.2)

## 2020-12-03 MED ORDER — SODIUM CHLORIDE 0.9% FLUSH
10.0000 mL | INTRAVENOUS | Status: DC | PRN
  Administered 2020-12-03: 10 mL via INTRAVENOUS
  Filled 2020-12-03: qty 10

## 2020-12-03 MED ORDER — HEPARIN SOD (PORK) LOCK FLUSH 100 UNIT/ML IV SOLN
500.0000 [IU] | Freq: Once | INTRAVENOUS | Status: AC
  Administered 2020-12-03: 500 [IU] via INTRAVENOUS
  Filled 2020-12-03: qty 5

## 2020-12-03 NOTE — Progress Notes (Signed)
[facility] NOTE  Patient Care Team: Sofie Hartigan, MD as PCP - General (Family Medicine) Cammie Sickle, MD as Consulting Physician (Hematology and Oncology)  CHIEF COMPLAINTS/PURPOSE OF CONSULTATION: Colon cancer  #  Oncology History Overview Note  # MAY 2020- 3. 03/09/19 Liver biopsy. Microscopic examination shows malignant cells with glandular architecture consistent with adenocarcinoma. The malignant cells are positive for CK20 and CDX-2. These findings support the clinical impression of metastatic colon adenocarcinoma. 4. 03/10/19 R hemicolectomy. Tumor site cecum. Adenocarcinoma. Mucinous features present. G2. No tumor deposits. Invades visceral peritoneum. No tumor perforation. LVI present. PNI not identified. All margins uninvolved. 1/12 LNs. PT4apN1. Periappendiceal inflammation c/w resolving abscess. Microsatellite stable (MSS). [Dr.Mettu; [facility]]  # SEP 4th 2020 [compared to May 2020]  Interval increase in size of the metastases to the hepatic dome, The metastasis to the left hepatic lobe is unchanged; 2.  New subcentimeter hypoattenuating lesion in the inferior right hepatic lobe, incompletely characterized on CT. 3.  Postsurgical changes following right hemicolectomy.  # OCT 2020- FOLFOX +avastin; CT dec 22nd 2020- [compared to [facility] sep 9th 2020]-Liver- slight progression versus stable disease; CT scan SEP 4th 2021- Progressive disease-left lower lobe lung nodule 8 mm [previously 4 mm]; increase in size of the hepatic metastases by few millimeters.;  Soft tissue nodule adjacent to anastomotic site again increased by few millimeters.STOP FOLFOX; cont avastin  #  SEP 20th, 2021- FOLFIRI+ AVASTIN  # JAN 2022- COVID [s/p Mab infusion; pills; skin rash resolved.]  # NGS/F-ONE-MUTATED K-RAS [G]  # PALLIATIVE CARE EVALUATION: 09/20/2019-[name]  # PAIN MANAGEMENT: NA   DIAGNOSIS: COLON CANCER  STAGE:  IV     ;  GOALS:Palliative  CURRENT/MOST RECENT  THERAPY : FOLFIRI+ avastin [C]    Cancer of right colon ([place])  07/05/2019 Initial Diagnosis   Cancer of right colon ([place])   07/24/2019 - 06/25/2020 Chemotherapy   The patient had dexamethasone (DECADRON) 4 MG tablet, 8 mg, Oral, Daily, 1 of 1 cycle, Start date: --, End date: -- palonosetron (ALOXI) injection 0.25 mg, 0.25 mg, Intravenous,  Once, 23 of 25 cycles Administration: 0.25 mg (07/24/2019), 0.25 mg (08/07/2019), 0.25 mg (08/21/2019), 0.25 mg (09/04/2019), 0.25 mg (09/20/2019), 0.25 mg (10/04/2019), 0.25 mg (10/16/2019), 0.25 mg (10/30/2019), 0.25 mg (11/22/2019), 0.25 mg (12/06/2019), 0.25 mg (12/20/2019), 0.25 mg (01/03/2020), 0.25 mg (01/29/2020), 0.25 mg (02/12/2020), 0.25 mg (02/26/2020), 0.25 mg (03/11/2020), 0.25 mg (03/25/2020), 0.25 mg (04/08/2020), 0.25 mg (04/24/2020), 0.25 mg (05/08/2020), 0.25 mg (05/22/2020), 0.25 mg (06/10/2020), 0.25 mg (06/25/2020) leucovorin 800 mg in dextrose 5 % 250 mL infusion, 844 mg, Intravenous,  Once, 23 of 25 cycles Administration: 800 mg (07/24/2019), 800 mg (08/07/2019), 800 mg (08/21/2019), 800 mg (09/04/2019), 800 mg (09/20/2019), 800 mg (10/04/2019), 800 mg (10/16/2019), 800 mg (10/30/2019), 800 mg (11/22/2019), 800 mg (12/06/2019), 800 mg (12/20/2019), 800 mg (01/03/2020), 800 mg (01/29/2020), 800 mg (02/12/2020), 800 mg (02/26/2020), 800 mg (03/11/2020), 800 mg (03/25/2020), 800 mg (04/08/2020), 800 mg (04/24/2020), 800 mg (05/08/2020), 800 mg (05/22/2020), 800 mg (06/10/2020), 800 mg (06/25/2020) oxaliplatin (ELOXATIN) 180 mg in dextrose 5 % 500 mL chemo infusion, 85 mg/m2 = 180 mg, Intravenous,  Once, 23 of 25 cycles Dose modification: 178 mg (original dose 85 mg/m2, Cycle 17, Reason: Other (see comments), Comment: insurance adjusted dose ) Administration: 180 mg (07/24/2019), 180 mg (08/07/2019), 180 mg (08/21/2019), 180 mg (09/04/2019), 180 mg (09/20/2019), 180 mg (10/04/2019), 180 mg (10/16/2019), 180 mg (10/30/2019), 180 mg (11/22/2019), 180 mg (12/06/2019), 180 mg (12/20/2019),  180 mg (01/03/2020),  180 mg (01/29/2020), 180 mg (02/12/2020), 180 mg (02/26/2020), 180 mg (03/11/2020), 180 mg (04/08/2020), 180 mg (04/24/2020), 180 mg (05/08/2020), 180 mg (05/22/2020), 180 mg (06/10/2020), 180 mg (06/25/2020) fluorouracil (ADRUCIL) 5,000 mg in sodium chloride 0.9 % 150 mL chemo infusion, 5,050 mg, Intravenous, 1 Day/Dose, 23 of 25 cycles Administration: 5,000 mg (07/24/2019), 5,000 mg (08/07/2019), 5,000 mg (08/21/2019), 5,050 mg (09/04/2019), 5,000 mg (10/04/2019), 5,000 mg (10/16/2019), 5,000 mg (11/22/2019), 5,000 mg (12/06/2019), 5,000 mg (12/20/2019), 5,000 mg (01/03/2020), 5,000 mg (01/29/2020), 5,000 mg (02/12/2020), 5,000 mg (02/26/2020), 5,000 mg (03/11/2020), 5,000 mg (03/25/2020), 5,000 mg (04/08/2020), 5,000 mg (04/24/2020), 5,000 mg (05/08/2020), 5,000 mg (05/22/2020), 5,000 mg (06/10/2020), 5,000 mg (06/25/2020) bevacizumab-bvzr (ZIRABEV) 400 mg in sodium chloride 0.9 % 100 mL chemo infusion, 5 mg/kg = 400 mg, Intravenous,  Once, 23 of 25 cycles Administration: 400 mg (08/07/2019), 400 mg (08/21/2019), 400 mg (09/04/2019), 400 mg (09/20/2019), 400 mg (10/04/2019), 400 mg (10/16/2019), 400 mg (10/30/2019), 400 mg (11/22/2019), 400 mg (12/06/2019), 400 mg (12/20/2019), 400 mg (01/03/2020), 400 mg (01/29/2020), 400 mg (02/12/2020), 400 mg (02/26/2020), 400 mg (03/11/2020), 400 mg (03/25/2020), 400 mg (04/08/2020), 400 mg (04/24/2020), 400 mg (05/08/2020), 400 mg (05/22/2020), 400 mg (06/10/2020), 400 mg (06/25/2020)  for chemotherapy treatment.    07/08/2020 -  Chemotherapy    Patient is on Treatment Plan: COLORECTAL FOLFIRI / BEVACIZUMAB Q14D      11/05/2020 Cancer Staging   Staging form: Colon and Rectum, AJCC 8th Edition - Clinical: Stage IVC (pM1c) - Signed by Cammie Sickle, MD on 11/05/2020     HISTORY OF PRESENTING ILLNESS:  Oscar [name] 80 y.o.  male with metastatic colon cancer to the liver currently on FOLFIRI plus Avastin chemotherapy-is here for follow-up to review the results of his MRI liver.  Given the concerns for  peptic ulcer on MRI liver-he was started on Carafate 4 times a day; recommend Protonix twice a day.  Patient noted to have significant improvement of his pain.  He denies any nausea vomiting abdominal pain.    Review of Systems  Constitutional: Positive for malaise/fatigue. Negative for chills, diaphoresis and fever.  HENT: Negative for nosebleeds and sore throat.   Eyes: Negative for double vision.  Respiratory: Negative for cough, hemoptysis, sputum production, shortness of breath and wheezing.   Cardiovascular: Negative for chest pain, palpitations, orthopnea and leg swelling.  Gastrointestinal: Negative for blood in stool, constipation, heartburn, melena, nausea and vomiting.  Genitourinary: Negative for dysuria, frequency and urgency.  Musculoskeletal: Positive for back pain and joint pain.  Skin: Negative.  Negative for itching and rash.  Neurological: Positive for tingling. Negative for focal weakness, weakness and headaches.  Endo/Heme/Allergies: Does not bruise/bleed easily.  Psychiatric/Behavioral: Negative for depression. The patient is not nervous/anxious and does not have insomnia.      MEDICAL HISTORY:  Past Medical History:  Diagnosis Date  . Arthritis   . Cancer ([place])    colon cancer 02/2019 per pt   . Diabetes mellitus without complication ([place])   . H/O colon cancer, stage IV   . Hyperlipemia   . Hypertension     SURGICAL HISTORY: Past Surgical History:  Procedure Laterality Date  . IR IMAGING GUIDED PORT INSERTION  07/20/2019  . JOINT REPLACEMENT      SOCIAL HISTORY: Social History   Socioeconomic History  . Marital status: Married    Spouse name: Not on file  . Number of children: Not on file  . Years of education: Not on  file  . Highest education level: Not on file  Occupational History  . Not on file  Tobacco Use  . Smoking status: Current Every Day Smoker    Packs/day: 0.25    Types: Cigarettes  . Smokeless tobacco: Never Used  . Tobacco  comment: 1 to 2 cigarettes a day occasionally  Vaping Use  . Vaping Use: Never used  Substance and Sexual Activity  . Alcohol use: No  . Drug use: No  . Sexual activity: Not on file  Other Topics Concern  . Not on file  Social History Narrative   Recruitment consultant retd; lives in [place]; smoking 3cig/day; [3/4 ppd x started at 9 years]; no alcohol. Son & daughter; wife dementia [waiting for placement].    Social Determinants of Health   Financial Resource Strain: Not on file  Food Insecurity: Not on file  Transportation Needs: Not on file  Physical Activity: Not on file  Stress: Not on file  Social Connections: Not on file  Intimate Partner Violence: Not on file    FAMILY HISTORY: Family History  Problem Relation Age of Onset  . Peptic Ulcer Disease Father     ALLERGIES:  is allergic to ace inhibitors.  MEDICATIONS:  Current Outpatient Medications  Medication Sig Dispense Refill  . amLODipine (NORVASC) 5 MG tablet Take 5 mg by mouth daily.    . meloxicam (MOBIC) 15 MG tablet Take 1 tablet (15 mg total) by mouth daily. 90 tablet 0  . nystatin (MYCOSTATIN) 100000 UNIT/ML suspension Take 5 mLs (500,000 Units total) by mouth 4 (four) times daily. 60 mL 0  . ondansetron (ZOFRAN ODT) 8 MG disintegrating tablet Take 1 tablet (8 mg total) by mouth every 8 (eight) hours as needed for nausea or vomiting. 45 tablet 0  . oxyCODONE (OXY IR/ROXICODONE) 5 MG immediate release tablet Take 1 tablet (5 mg total) by mouth every 6 (six) hours as needed for severe pain. 60 tablet 0  . pantoprazole (PROTONIX) 40 MG tablet Take 1 tablet (40 mg total) by mouth 2 (two) times daily. 60 tablet 3  . pravastatin (PRAVACHOL) 40 MG tablet Take 40 mg by mouth daily.     . prochlorperazine (COMPAZINE) 10 MG tablet Take 1 tablet (10 mg total) by mouth every 6 (six) hours as needed for nausea or vomiting. 60 tablet 0  . sucralfate (CARAFATE) 1 g tablet Take 1 tablet (1 g total) by mouth 4 (four)  times daily -  with meals and at bedtime. 90 tablet 3  . tamsulosin (FLOMAX) 0.4 MG CAPS capsule Take 1 capsule (0.4 mg total) by mouth daily. 30 capsule 11   No current facility-administered medications for this visit.   Facility-Administered Medications Ordered in Other Visits  Medication Dose Route Frequency Provider Last Rate Last Admin  . sodium chloride flush (NS) 0.9 % injection 10 mL  10 mL Intravenous PRN Sindy Guadeloupe, MD   10 mL at 12/03/20 0908      .  PHYSICAL EXAMINATION: ECOG PERFORMANCE STATUS: 0 - Asymptomatic  Vitals:   12/03/20 0932  BP: 119/64  Pulse: 74  Resp: 16  Temp: 99 F (37.2 C)  SpO2: 100%   Filed Weights   12/03/20 0932  Weight: 182 lb 12.8 oz (82.9 kg)    Physical Exam HENT:     Head: Normocephalic and atraumatic.     Mouth/Throat:     Pharynx: No oropharyngeal exudate.     Comments: Whitish patches noted right side.  Eyes:  Pupils: Pupils are equal, round, and reactive to light.  Cardiovascular:     Rate and Rhythm: Normal rate and regular rhythm.  Pulmonary:     Effort: No respiratory distress.     Breath sounds: No wheezing.     Comments: Decreased breath sounds bilaterally.  No wheeze or crackles Abdominal:     General: Bowel sounds are normal. There is no distension.     Palpations: Abdomen is soft. There is no mass.     Tenderness: There is no abdominal tenderness. There is no guarding or rebound.  Musculoskeletal:        General: No tenderness. Normal range of motion.     Cervical back: Normal range of motion and neck supple.  Skin:    General: Skin is warm.  Neurological:     Mental Status: He is alert and oriented to person, place, and time.  Psychiatric:        Mood and Affect: Affect normal.    LABORATORY DATA:  I have reviewed the data as listed Lab Results  Component Value Date   WBC 4.4 12/03/2020   HGB 11.8 (L) 12/03/2020   HCT 34.9 (L) 12/03/2020   MCV 100.9 (H) 12/03/2020   PLT 147 (L) 12/03/2020    Recent Labs    06/25/20 0841 07/08/20 0843 07/22/20 0836 08/05/20 0821 11/18/20 1354 11/19/20 0843 12/03/20 0859  NA 138 139 138   < > 133* 138 141  K 3.6 3.6 3.6   < > 3.8 3.8 3.8  CL 108 108 107   < > 104 106 109  CO2 _0 < > _1 GLUCOSE 201* 209* 195*   < > 165* 199* 155*  BUN _2 < > _3 CREATININE 0.80 0.70 0.89   < > 0.93 0.97 0.81  CALCIUM 8.0* 8.0* 8.1*   < > 8.0* 8.2* 8.0*  GFRNONAA >60 >60 >60   < > >60 >60 >60  GFRAA >60 >60 >60  --   --   --   --   PROT 6.5 6.5 6.7   < > 6.5 6.2* 6.1*  ALBUMIN 3.2* 3.1* 3.1*   < > 2.6* 2.6* 2.3*  AST 32 31 29   < > 28 31 35  ALT _4 < > _5 ALKPHOS 121 102 108   < > 107 107 109  BILITOT 0.8 0.7 0.8   < > 0.9 0.6 0.8   < > = values in this interval not displayed.    RADIOGRAPHIC STUDIES: I have personally reviewed the radiological images as listed and agreed with the findings in the report. DG Abdomen 1 View  Result Date: 11/08/2020 CLINICAL DATA:  Constipation EXAM: ABDOMEN - 1 VIEW COMPARISON:  11/07/2020 FINDINGS: The bowel gas pattern is normal. No radio-opaque calculi or other significant radiographic abnormality are seen. Bowel staples right abdomen. IMPRESSION: Negative. Electronically Signed   By: Franchot Gallo M.D.   On: 11/08/2020 07:59   CT HEAD WO CONTRAST  Result Date: 11/09/2020 CLINICAL DATA:  80 year old male with history of dizziness. EXAM: CT HEAD WITHOUT CONTRAST TECHNIQUE: Contiguous axial images were obtained from the base of the skull through the vertex without intravenous contrast. COMPARISON:  Head CT 03/06/2016. FINDINGS: Brain: Mild cerebral atrophy. Patchy and confluent areas of decreased attenuation are noted throughout the deep and periventricular white matter of the cerebral hemispheres bilaterally, compatible with chronic  microvascular ischemic disease. Well-defined areas of low attenuation are noted in the basal ganglia bilaterally, most evident in the head of the  left caudate nucleus, indicative of old lacunar infarcts. No evidence of acute infarction, hemorrhage, hydrocephalus, extra-axial collection or mass lesion/mass effect. Vascular: No hyperdense vessel or unexpected calcification. Skull: Normal. Negative for fracture or focal lesion. Sinuses/Orbits: No acute finding. Other: None. IMPRESSION: 1. No acute intracranial abnormalities. 2. Mild cerebral atrophy with chronic microvascular ischemic changes in the cerebral white matter and old lacunar infarcts in the basal ganglia bilaterally, as above. Electronically Signed   By: Vinnie Langton M.D.   On: 11/09/2020 13:36   CT ABDOMEN PELVIS W CONTRAST  Result Date: 11/08/2020 CLINICAL DATA:  Lower abdominal pain, nausea and vomiting, and constipation for 4 days. Metastatic colon carcinoma, currently undergoing chemotherapy. EXAM: CT ABDOMEN AND PELVIS WITH CONTRAST TECHNIQUE: Multidetector CT imaging of the abdomen and pelvis was performed using the standard protocol following bolus administration of intravenous contrast. CONTRAST:  16m OMNIPAQUE IOHEXOL 300 MG/ML  SOLN COMPARISON:  09/24/2020 FINDINGS: Lower Chest: New atelectasis is seen in the posteromedial left lower lobe which partially obscures a 9 mm pulmonary nodule which was more clearly demonstrated on recent CT. New mild patchy infiltrate is seen in the inferior lingula. A new ill-defined nodular opacity is seen in the posterior right lower lobe which measures 10 mm on image 4/4, and this could represent infectious or inflammatory etiology or metastasis. Hepatobiliary: A hypovascular mass in the lateral segment of the left lobe currently measures 4.5 x 4.4 cm, compared to 3.9 x 3.6 cm previously. A smaller low-attenuation lesion in the posterior right hepatic lobe currently measures 1.4 cm compared to 0.8 cm previously. A large cyst in the anterior segment of the right lobe is unchanged. Gallbladder is unremarkable. Mild diffuse biliary ductal dilatation  remains stable. Pancreas: No mass or inflammatory changes. No evidence of pancreatic ductal dilatation. Spleen: Within normal limits in size and appearance. Adrenals/Urinary Tract: No masses identified. Left lower pole renal cyst shows no significant change. No evidence of ureteral calculi or hydronephrosis. Stomach/Bowel: No evidence of obstruction, inflammatory process or abnormal fluid collections. Surgical clips again seen in the right colon. A soft tissue nodule or lymph node is again seen adjacent to the surgical clips, which measures 1.7 x 1.5 cm and remains stable since previous study. Vascular/Lymphatic: Stable 1.7 cm right pericolonic lymph node or soft tissue density, as noted above. No other sites of lymphadenopathy identified. No abdominal aortic aneurysm. Aortic atherosclerotic calcification noted. Reproductive:  No mass or other significant abnormality. Other:  None. Musculoskeletal:  No suspicious bone lesions identified. IMPRESSION: Mild progression of liver metastases since prior exam. Stable 1.7 cm soft tissue nodule or lymph node adjacent to surgical clips in right colon. New atelectasis in posteromedial left lower lobe, which obscures a 9 mm pulmonary metastasis in this region on recent CT. New 1 cm ill-defined posterior right lower lobe nodule is nonspecific, and could represent infectious or inflammatory etiology or metastasis. Electronically Signed   By: JMarlaine HindM.D.   On: 11/08/2020 10:33   MR LIVER W WO CONTRAST  Result Date: 11/27/2020 CLINICAL DATA:  Metastatic colon carcinoma. Undergoing chemotherapy. EXAM: MRI ABDOMEN WITHOUT AND WITH CONTRAST TECHNIQUE: Multiplanar multisequence MR imaging of the abdomen was performed both before and after the administration of intravenous contrast. CONTRAST:  736mGADAVIST GADOBUTROL 1 MMOL/ML IV SOLN COMPARISON:  CT on 11/08/2020 FINDINGS: Lower chest: A sub-cm T2 hypointense nodule is seen  in the posterior left lower lobe, which appears  decreased in size since previous study. Hepatobiliary: A hypovascular mass is again seen in segments 2/3 measuring 4.4 x 4.2 cm on image 10/6. A smaller hypovascular lesion measuring 1.2 cm is seen in segment 6 on image 40/14. These are consistent with liver metastases and show no significant change in size compared to recent CT. A 7 cm benign-appearing cyst is again seen in the liver dome, also without change. Gallbladder is unremarkable. Diffuse biliary ductal dilatation is again seen with common bile duct measuring 1.2 cm. This is also unchanged, and there is no evidence of choledocholithiasis or other obstructing etiology. Pancreas: No mass or inflammatory changes. No evidence of pancreatic ductal dilatation. Spleen:  Within normal limits in size and appearance. Adrenals/Urinary Tract: No masses identified. Simple left renal cyst again seen measuring approximately 4 cm. No evidence of hydronephrosis. Stomach/Bowel: Wall thickening and adjacent retroperitoneal edema is now seen involving the duodenal bulb and 2nd portion of the duodenum. An ulcer crater containing fluid and gas is also seen in the medial wall of the post bulbar duodenum measuring approximately 1.5 cm. Vascular/Lymphatic: No pathologically enlarged lymph nodes identified. No abdominal aortic aneurysm. Other: Tiny amount of free fluid and right paracolic gutter and more since pouch. Musculoskeletal:  No suspicious bone lesions identified. IMPRESSION: Stable hypovascular liver metastases. No new or progressive metastatic disease identified. Ulcer involving the post-bulbar duodenum, with adjacent retroperitoneal inflammatory changes. These results will be called to the ordering clinician or representative by the Radiologist Assistant, and communication documented in the PACS or [employer]. No significant change in diffuse biliary ductal dilatation. No radiographic evidence of choledocholithiasis. This could be due edema involving the descending  duodenum secondary to the ulcer described above. Sub-cm nodule in the posterior left lower lobe, decreased in size since previous study. Electronically Signed   By: Marlaine Hind M.D.   On: 11/27/2020 10:58   DG Chest Portable 1 View  Result Date: 11/08/2020 CLINICAL DATA:  Shortness of breath.  COVID in chemotherapy patient. EXAM: PORTABLE CHEST 1 VIEW COMPARISON:  09/24/2020 chest CT FINDINGS: Porta catheter with tip at the upper right atrium. Normal heart size and mediastinal contours. 13 mm nodule over the left mid lung. Linear opacity at the left base compatible with atelectasis. There is known pulmonary metastatic disease. IMPRESSION: No visible pneumonia. Electronically Signed   By: Monte Fantasia M.D.   On: 11/08/2020 08:46   DG Abd 2 Views  Result Date: 11/18/2020 CLINICAL DATA:  Diarrhea for 1.5 weeks. Abdominal pain for 2.5 weeks. Colon cancer. History of constipation. EXAM: ABDOMEN - 2 VIEW COMPARISON:  11/08/2020 CT FINDINGS: Upright and supine views. The upright view demonstrates no free intraperitoneal air or significant air-fluid levels. Supine views demonstrate no gaseous distention of bowel loops. Surgical sutures in the right side of the abdomen. No abnormal abdominal calcifications. No appendicolith. IMPRESSION: No acute findings. Electronically Signed   By: Abigail Miyamoto M.D.   On: 11/18/2020 16:46   DG Abd 2 Views  Result Date: 11/07/2020 CLINICAL DATA:  History of abdominal pain and bloating for 2 days, history of colon carcinoma with metastatic disease EXAM: ABDOMEN - 2 VIEW COMPARISON:  CT from 09/24/2020 FINDINGS: Scattered large and small bowel gas is noted. No free air is seen. No abnormal mass or abnormal calcifications are noted. Postsurgical changes in the right colon are noted. Degenerative changes of lumbar spine are noted. IMPRESSION: No acute abnormality noted. Electronically Signed   By:  Inez Catalina M.D.   On: 11/07/2020 15:43    ASSESSMENT & PLAN:   Cancer of  right colon ([place]) #Right-sided colon adenocarcinoma-with synchronous metastasis to liver/unresectable. Currently on FOLFIRI plus Avastin; MRI FEB 2022- over stable liver disease disease; but peptic ulcer [see below]  # HOLD further chemo [sec to poor tolerance]; recommend liver directed therapy-ablation/Y 90 discussed at tumor conference on 2/10.   # Peptic ulcer disease-findings noted on MRI liver- on carafate QID; recommend protonix 40 mg BID. Awaiting GI eval on 2/16.   # PN- G-1-2; sec to oxaliplatin; STABLE.   # DISPOSITION:  # NO IVFs today. # referral to IR ASAP re: liver directed therapy.  # follow up in 4 weeks- MD; labs- cbc/cmp/UA/ CEA;-Dr.B    All questions were answered. The patient knows to call the clinic with any problems, questions or concerns.    Cammie Sickle, MD 12/03/2020 1:04 PM

## 2020-12-03 NOTE — Assessment & Plan Note (Addendum)
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 Currently on FOLFIRI plus Avastin; MRI FEB 2022- over stable liver disease disease; but peptic ulcer [see below]  # HOLD further chemo [sec to poor tolerance]; recommend liver directed therapy-ablation/Y 90 discussed at tumor conference on 2/10.   # Peptic ulcer disease-findings noted on MRI liver- on carafate QID; recommend protonix 40 mg BID. Awaiting GI eval on 2/16.   # PN- G-1-2; sec to oxaliplatin; STABLE.   # DISPOSITION:  # NO IVFs today. # referral to IR ASAP re: liver directed therapy.  # follow up in 4 weeks- MD; labs- cbc/cmp/UA/ CEA;-Dr.B

## 2020-12-04 ENCOUNTER — Telehealth: Payer: Self-pay | Admitting: Gastroenterology

## 2020-12-04 ENCOUNTER — Ambulatory Visit: Payer: Medicare HMO | Admitting: Gastroenterology

## 2020-12-04 ENCOUNTER — Encounter: Payer: Self-pay | Admitting: Gastroenterology

## 2020-12-04 ENCOUNTER — Other Ambulatory Visit: Payer: Self-pay

## 2020-12-04 ENCOUNTER — Other Ambulatory Visit: Payer: Self-pay | Admitting: Internal Medicine

## 2020-12-04 VITALS — BP 120/74 | HR 83 | Ht 72.0 in | Wt 184.0 lb

## 2020-12-04 DIAGNOSIS — K269 Duodenal ulcer, unspecified as acute or chronic, without hemorrhage or perforation: Secondary | ICD-10-CM | POA: Insufficient documentation

## 2020-12-04 DIAGNOSIS — C189 Malignant neoplasm of colon, unspecified: Secondary | ICD-10-CM

## 2020-12-04 LAB — CEA: CEA: 29.7 ng/mL — ABNORMAL HIGH (ref 0.0–4.7)

## 2020-12-04 NOTE — Telephone Encounter (Signed)
Offered to make 2 month follow up appointment, pt declined

## 2020-12-04 NOTE — Progress Notes (Signed)
Oscar Bellows MD, MRCP(U.K) [address]  [place]  [place], [place] [address]  Main: [phone]  Fax: [phone]   Gastroenterology Consultation  Referring Provider:   Dr. Rogue Bussing  primary Care Physician:  Sofie Hartigan, MD Primary Gastroenterologist:  Dr. Jonathon [name]  Reason for Consultation:     Duodenal ulcer        HPI:   Oscar [name] is a 80 y.o. y/o male who follows with Dr. Rogue Bussing at the cancer center for metastatic colon cancer with lesions in the liver.  S/p right hemicolectomy.  Diagnosed in 07/09/2019.  On 11/27/2020 underwent an MRI of the liver with and without contrast and was incidentally found to have ulcer involving the post bulbar duodenum with adjacent retroperitoneal inflammatory changes.  Stable liver metastasis noted.  Hemoglobin checked on 12/03/2020: Hemoglobin 11.8 g with an MCV of 100.9 which has been stable for over a few months.  No elevation in the BUN/creatinine ratio.  He states that 6 weeks back he had severe abdominal pain which lasted till he was commenced on Protonix and presently has no abdominal pain at all.  Denies any NSAID use at all.  Stool has always been brown.  Denies any blood in the stool or black-colored stool   Past Medical History:  Diagnosis Date  . Arthritis   . Cancer ([place])    colon cancer 02/2019 per pt   . Diabetes mellitus without complication ([place])   . H/O colon cancer, stage IV   . Hyperlipemia   . Hypertension     Past Surgical History:  Procedure Laterality Date  . IR IMAGING GUIDED PORT INSERTION  07/20/2019  . JOINT REPLACEMENT      Prior to Admission medications   Medication Sig Start Date End Date Taking? Authorizing Provider  amLODipine (NORVASC) 5 MG tablet Take 5 mg by mouth daily.    [provider]  meloxicam (MOBIC) 15 MG tablet Take 1 tablet (15 mg total) by mouth daily. 09/30/20   Cammie Sickle, MD  nystatin (MYCOSTATIN) 100000 UNIT/ML suspension Take 5 mLs (500,000  Units total) by mouth 4 (four) times daily. 11/05/20   Cammie Sickle, MD  ondansetron (ZOFRAN ODT) 8 MG disintegrating tablet Take 1 tablet (8 mg total) by mouth every 8 (eight) hours as needed for nausea or vomiting. 11/19/20   Cammie Sickle, MD  oxyCODONE (OXY IR/ROXICODONE) 5 MG immediate release tablet Take 1 tablet (5 mg total) by mouth every 6 (six) hours as needed for severe pain. 11/21/20   Borders, Kirt Boys, NP  pantoprazole (PROTONIX) 40 MG tablet Take 1 tablet (40 mg total) by mouth 2 (two) times daily. 11/27/20   Cammie Sickle, MD  pravastatin (PRAVACHOL) 40 MG tablet Take 40 mg by mouth daily.     [provider]  prochlorperazine (COMPAZINE) 10 MG tablet Take 1 tablet (10 mg total) by mouth every 6 (six) hours as needed for nausea or vomiting. 11/21/20   Borders, Kirt Boys, NP  sucralfate (CARAFATE) 1 g tablet Take 1 tablet (1 g total) by mouth 4 (four) times daily -  with meals and at bedtime. 11/27/20   Cammie Sickle, MD  tamsulosin (FLOMAX) 0.4 MG CAPS capsule Take 1 capsule (0.4 mg total) by mouth daily. 03/15/20 03/15/21  Borders, Kirt Boys, NP    Family History  Problem Relation Age of Onset  . Peptic Ulcer Disease Father      Social History   Tobacco Use  .  Smoking status: Current Every Day Smoker    Packs/day: 0.25    Types: Cigarettes  . Smokeless tobacco: Never Used  . Tobacco comment: 1 to 2 cigarettes a day occasionally  Vaping Use  . Vaping Use: Never used  Substance Use Topics  . Alcohol use: No  . Drug use: No    Allergies as of 12/04/2020 - Review Complete 12/03/2020  Allergen Reaction Noted  . Ace inhibitors Swelling 07/20/2014    Review of Systems:    All systems reviewed and negative except where noted in HPI.   Physical Exam:  There were no vitals taken for this visit. No LMP for male patient. Psych:  Alert and cooperative. Normal mood and affect. General:   Alert,  Well-developed, well-nourished, pleasant and  cooperative in NAD Head:  Normocephalic and atraumatic. Eyes:  Sclera clear, no icterus.   Conjunctiva pink. Ears:  Normal auditory acuity. Lungs:  Respirations even and unlabored.  Clear throughout to auscultation.   No wheezes, crackles, or rhonchi. No acute distress. Heart:  Regular rate and rhythm; no murmurs, clicks, rubs, or gallops. Abdomen:  Normal bowel sounds.  No bruits.  Soft, non-tender and non-distended without masses, hepatosplenomegaly or hernias noted.  No guarding or rebound tenderness.    Neurologic:  Alert and oriented x3;  grossly normal neurologically. Psych:  Alert and cooperative. Normal mood and affect.  Imaging Studies: DG Abdomen 1 View  Result Date: 11/08/2020 CLINICAL DATA:  Constipation EXAM: ABDOMEN - 1 VIEW COMPARISON:  11/07/2020 FINDINGS: The bowel gas pattern is normal. No radio-opaque calculi or other significant radiographic abnormality are seen. Bowel staples right abdomen. IMPRESSION: Negative. Electronically Signed   By: Franchot Gallo M.D.   On: 11/08/2020 07:59   CT HEAD WO CONTRAST  Result Date: 11/09/2020 CLINICAL DATA:  80 year old male with history of dizziness. EXAM: CT HEAD WITHOUT CONTRAST TECHNIQUE: Contiguous axial images were obtained from the base of the skull through the vertex without intravenous contrast. COMPARISON:  Head CT 03/06/2016. FINDINGS: Brain: Mild cerebral atrophy. Patchy and confluent areas of decreased attenuation are noted throughout the deep and periventricular white matter of the cerebral hemispheres bilaterally, compatible with chronic microvascular ischemic disease. Well-defined areas of low attenuation are noted in the basal ganglia bilaterally, most evident in the head of the left caudate nucleus, indicative of old lacunar infarcts. No evidence of acute infarction, hemorrhage, hydrocephalus, extra-axial collection or mass lesion/mass effect. Vascular: No hyperdense vessel or unexpected calcification. Skull: Normal. Negative  for fracture or focal lesion. Sinuses/Orbits: No acute finding. Other: None. IMPRESSION: 1. No acute intracranial abnormalities. 2. Mild cerebral atrophy with chronic microvascular ischemic changes in the cerebral white matter and old lacunar infarcts in the basal ganglia bilaterally, as above. Electronically Signed   By: Vinnie Langton M.D.   On: 11/09/2020 13:36   CT ABDOMEN PELVIS W CONTRAST  Result Date: 11/08/2020 CLINICAL DATA:  Lower abdominal pain, nausea and vomiting, and constipation for 4 days. Metastatic colon carcinoma, currently undergoing chemotherapy. EXAM: CT ABDOMEN AND PELVIS WITH CONTRAST TECHNIQUE: Multidetector CT imaging of the abdomen and pelvis was performed using the standard protocol following bolus administration of intravenous contrast. CONTRAST:  161mL OMNIPAQUE IOHEXOL 300 MG/ML  SOLN COMPARISON:  09/24/2020 FINDINGS: Lower Chest: New atelectasis is seen in the posteromedial left lower lobe which partially obscures a 9 mm pulmonary nodule which was more clearly demonstrated on recent CT. New mild patchy infiltrate is seen in the inferior lingula. A new ill-defined nodular opacity is seen in  the posterior right lower lobe which measures 10 mm on image 4/4, and this could represent infectious or inflammatory etiology or metastasis. Hepatobiliary: A hypovascular mass in the lateral segment of the left lobe currently measures 4.5 x 4.4 cm, compared to 3.9 x 3.6 cm previously. A smaller low-attenuation lesion in the posterior right hepatic lobe currently measures 1.4 cm compared to 0.8 cm previously. A large cyst in the anterior segment of the right lobe is unchanged. Gallbladder is unremarkable. Mild diffuse biliary ductal dilatation remains stable. Pancreas: No mass or inflammatory changes. No evidence of pancreatic ductal dilatation. Spleen: Within normal limits in size and appearance. Adrenals/Urinary Tract: No masses identified. Left lower pole renal cyst shows no significant  change. No evidence of ureteral calculi or hydronephrosis. Stomach/Bowel: No evidence of obstruction, inflammatory process or abnormal fluid collections. Surgical clips again seen in the right colon. A soft tissue nodule or lymph node is again seen adjacent to the surgical clips, which measures 1.7 x 1.5 cm and remains stable since previous study. Vascular/Lymphatic: Stable 1.7 cm right pericolonic lymph node or soft tissue density, as noted above. No other sites of lymphadenopathy identified. No abdominal aortic aneurysm. Aortic atherosclerotic calcification noted. Reproductive:  No mass or other significant abnormality. Other:  None. Musculoskeletal:  No suspicious bone lesions identified. IMPRESSION: Mild progression of liver metastases since prior exam. Stable 1.7 cm soft tissue nodule or lymph node adjacent to surgical clips in right colon. New atelectasis in posteromedial left lower lobe, which obscures a 9 mm pulmonary metastasis in this region on recent CT. New 1 cm ill-defined posterior right lower lobe nodule is nonspecific, and could represent infectious or inflammatory etiology or metastasis. Electronically Signed   By: Marlaine Hind M.D.   On: 11/08/2020 10:33   MR LIVER W WO CONTRAST  Result Date: 11/27/2020 CLINICAL DATA:  Metastatic colon carcinoma. Undergoing chemotherapy. EXAM: MRI ABDOMEN WITHOUT AND WITH CONTRAST TECHNIQUE: Multiplanar multisequence MR imaging of the abdomen was performed both before and after the administration of intravenous contrast. CONTRAST:  20mL GADAVIST GADOBUTROL 1 MMOL/ML IV SOLN COMPARISON:  CT on 11/08/2020 FINDINGS: Lower chest: A sub-cm T2 hypointense nodule is seen in the posterior left lower lobe, which appears decreased in size since previous study. Hepatobiliary: A hypovascular mass is again seen in segments 2/3 measuring 4.4 x 4.2 cm on image 10/6. A smaller hypovascular lesion measuring 1.2 cm is seen in segment 6 on image 40/14. These are consistent with  liver metastases and show no significant change in size compared to recent CT. A 7 cm benign-appearing cyst is again seen in the liver dome, also without change. Gallbladder is unremarkable. Diffuse biliary ductal dilatation is again seen with common bile duct measuring 1.2 cm. This is also unchanged, and there is no evidence of choledocholithiasis or other obstructing etiology. Pancreas: No mass or inflammatory changes. No evidence of pancreatic ductal dilatation. Spleen:  Within normal limits in size and appearance. Adrenals/Urinary Tract: No masses identified. Simple left renal cyst again seen measuring approximately 4 cm. No evidence of hydronephrosis. Stomach/Bowel: Wall thickening and adjacent retroperitoneal edema is now seen involving the duodenal bulb and 2nd portion of the duodenum. An ulcer crater containing fluid and gas is also seen in the medial wall of the post bulbar duodenum measuring approximately 1.5 cm. Vascular/Lymphatic: No pathologically enlarged lymph nodes identified. No abdominal aortic aneurysm. Other: Tiny amount of free fluid and right paracolic gutter and more since pouch. Musculoskeletal:  No suspicious bone lesions identified. IMPRESSION:  Stable hypovascular liver metastases. No new or progressive metastatic disease identified. Ulcer involving the post-bulbar duodenum, with adjacent retroperitoneal inflammatory changes. These results will be called to the ordering clinician or representative by the Radiologist Assistant, and communication documented in the PACS or [employer]. No significant change in diffuse biliary ductal dilatation. No radiographic evidence of choledocholithiasis. This could be due edema involving the descending duodenum secondary to the ulcer described above. Sub-cm nodule in the posterior left lower lobe, decreased in size since previous study. Electronically Signed   By: Marlaine Hind M.D.   On: 11/27/2020 10:58   DG Chest Portable 1 View  Result Date:  11/08/2020 CLINICAL DATA:  Shortness of breath.  COVID in chemotherapy patient. EXAM: PORTABLE CHEST 1 VIEW COMPARISON:  09/24/2020 chest CT FINDINGS: Porta catheter with tip at the upper right atrium. Normal heart size and mediastinal contours. 13 mm nodule over the left mid lung. Linear opacity at the left base compatible with atelectasis. There is known pulmonary metastatic disease. IMPRESSION: No visible pneumonia. Electronically Signed   By: Monte Fantasia M.D.   On: 11/08/2020 08:46   DG Abd 2 Views  Result Date: 11/18/2020 CLINICAL DATA:  Diarrhea for 1.5 weeks. Abdominal pain for 2.5 weeks. Colon cancer. History of constipation. EXAM: ABDOMEN - 2 VIEW COMPARISON:  11/08/2020 CT FINDINGS: Upright and supine views. The upright view demonstrates no free intraperitoneal air or significant air-fluid levels. Supine views demonstrate no gaseous distention of bowel loops. Surgical sutures in the right side of the abdomen. No abnormal abdominal calcifications. No appendicolith. IMPRESSION: No acute findings. Electronically Signed   By: Abigail Miyamoto M.D.   On: 11/18/2020 16:46   DG Abd 2 Views  Result Date: 11/07/2020 CLINICAL DATA:  History of abdominal pain and bloating for 2 days, history of colon carcinoma with metastatic disease EXAM: ABDOMEN - 2 VIEW COMPARISON:  CT from 09/24/2020 FINDINGS: Scattered large and small bowel gas is noted. No free air is seen. No abnormal mass or abnormal calcifications are noted. Postsurgical changes in the right colon are noted. Degenerative changes of lumbar spine are noted. IMPRESSION: No acute abnormality noted. Electronically Signed   By: Inez Catalina M.D.   On: 11/07/2020 15:43    Assessment and Plan:   ROSEVELT LUU is a 80 y.o. y/o male with a history of metastatic colon cancer, s/p right hemicolectomy on treatment and follows with Dr. Rogue Bussing at the cancer center.  Recently underwent an MRI of the abdomen and was incidentally found to have a lesion in  the duodenum suggestive of an ulcer with retroperitoneal changes but no evidence of perforation.  Patient is on a PPI and has been sent for my evaluation.    Plan 1.  H. pylori breath test to rule out H. pylori related ulceration. 2.  Avoid all NSAIDs 3.  Continue Protonix 40 mg twice daily and add Carafate 4 times daily 4.  Will schedule for an EGD in 2-3 weeks to evaluate the lesion.  The next 2 to 3 weeks will give ample time for the ulcer to heal.  Hemoglobin has been stable and there is no evidence of any acute bleeding.   I have discussed alternative options, risks & benefits,  which include, but are not limited to, bleeding, infection, perforation,respiratory complication & drug reaction.  The patient agrees with this plan & written consent will be obtained.     Follow up in 8 to 12 weeks   Dr Oscar Bellows MD,MRCP(U.K)

## 2020-12-05 ENCOUNTER — Encounter: Payer: Self-pay | Admitting: *Deleted

## 2020-12-05 ENCOUNTER — Ambulatory Visit
Admission: RE | Admit: 2020-12-05 | Discharge: 2020-12-05 | Disposition: A | Discharge status: Home / Self Care | Payer: Medicare HMO | Source: Ambulatory Visit | Attending: Internal Medicine | Admitting: Internal Medicine

## 2020-12-05 DIAGNOSIS — C189 Malignant neoplasm of colon, unspecified: Secondary | ICD-10-CM | POA: Diagnosis not present

## 2020-12-05 DIAGNOSIS — C787 Secondary malignant neoplasm of liver and intrahepatic bile duct: Secondary | ICD-10-CM | POA: Diagnosis not present

## 2020-12-05 HISTORY — PX: IR RADIOLOGIST EVAL & MGMT: IMG5224

## 2020-12-05 LAB — H. PYLORI BREATH TEST: H pylori Breath Test: NEGATIVE

## 2020-12-05 NOTE — Consult Note (Signed)
Chief Complaint: Patient was consulted remotely today (TeleHealth) for colon cancer metastatic to the liver at the request of Brahmanday,Govinda R.    Referring Physician(s): Brahmanday,Govinda R  History of Present Illness: QUENTION MCNEILL is a 80 y.o. 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   Patient underwent neoadjuvant chemotherapy and right hemicolectomy (May 2020).  Hepatic metastatic disease also confirmed by biopsy in May 2020.  CT imaging in September 2020 showed progression of metastatic disease.  FOLFOX + Avastin begun Oct 2020.  In Sept 2021, CT imaging showed progression of disease and he was transitioned to FOLFIRI + Avastin.   MRI 11/27/20 shows stable hepatic disease with 2 discrete lesions (Seg 2/3 4.4 x 4.2 cm and Seg 6 1.2 cm).  However, Mr. Portner also developed a peptic ulcer and has been begun on carafate and PPI.  Further chemo held for now and patient is referred for evaluation for liver directed therapy.   Of note, CT CAP from 09/24/20 also shows pulmonary metastatic disease and a peritoneal implant just caudal to the prior hemicolectomy anastomosis.    I spoke to Mr. Swamy and his daughter over the phone today.  We discussed Y90, chemoembolization, bland embolization and percutaneous ablation.  We settled on an initial plan of bland embolization followed by microwave ablation as this offers the best chance of local control of these hepatic lesions.  Y90 will remain an option if progressive disease develops.    Past Medical History:  Diagnosis Date  . Arthritis   . Cancer ([place])    colon cancer 02/2019 per pt   . Diabetes mellitus without complication ([place])   . H/O colon cancer, stage IV   . Hyperlipemia   . Hypertension     Past Surgical History:  Procedure Laterality Date  . IR IMAGING GUIDED PORT INSERTION  07/20/2019  . JOINT REPLACEMENT      Allergies: Ace inhibitors  Medications: Prior to Admission  medications   Medication Sig Start Date End Date Taking? Authorizing Provider  amLODipine (NORVASC) 5 MG tablet Take 5 mg by mouth daily.    [provider]  meloxicam (MOBIC) 15 MG tablet Take 1 tablet (15 mg total) by mouth daily. 09/30/20   Cammie Sickle, MD  nystatin (MYCOSTATIN) 100000 UNIT/ML suspension Take 5 mLs (500,000 Units total) by mouth 4 (four) times daily. 11/05/20   Cammie Sickle, MD  ondansetron (ZOFRAN ODT) 8 MG disintegrating tablet Take 1 tablet (8 mg total) by mouth every 8 (eight) hours as needed for nausea or vomiting. 11/19/20   Cammie Sickle, MD  oxyCODONE (OXY IR/ROXICODONE) 5 MG immediate release tablet Take 1 tablet (5 mg total) by mouth every 6 (six) hours as needed for severe pain. Patient not taking: Reported on 12/04/2020 11/21/20   Borders, Kirt Boys, NP  pantoprazole (PROTONIX) 40 MG tablet Take 1 tablet (40 mg total) by mouth 2 (two) times daily. 11/27/20   Cammie Sickle, MD  pravastatin (PRAVACHOL) 40 MG tablet Take 40 mg by mouth daily.     [provider]  prochlorperazine (COMPAZINE) 10 MG tablet Take 1 tablet (10 mg total) by mouth every 6 (six) hours as needed for nausea or vomiting. Patient not taking: Reported on 12/04/2020 11/21/20   Borders, Kirt Boys, NP  sucralfate (CARAFATE) 1 g tablet Take 1 tablet (1 g total) by mouth 4 (four) times daily -  with meals and at bedtime. 11/27/20   Rogue Bussing,  Elisha Headland, MD  tamsulosin (FLOMAX) 0.4 MG CAPS capsule Take 1 capsule (0.4 mg total) by mouth daily. 03/15/20 03/15/21  Borders, Kirt Boys, NP     Family History  Problem Relation Age of Onset  . Peptic Ulcer Disease Father     Social History   Socioeconomic History  . Marital status: Married    Spouse name: Not on file  . Number of children: Not on file  . Years of education: Not on file  . Highest education level: Not on file  Occupational History  . Not on file  Tobacco Use  . Smoking status: Current Every Day  Smoker    Packs/day: 0.25    Types: Cigarettes  . Smokeless tobacco: Never Used  . Tobacco comment: 1 to 2 cigarettes a day occasionally  Vaping Use  . Vaping Use: Never used  Substance and Sexual Activity  . Alcohol use: No  . Drug use: No  . Sexual activity: Not on file  Other Topics Concern  . Not on file  Social History Narrative   Recruitment consultant retd; lives in [place]; smoking 3cig/day; [3/4 ppd x started at 9 years]; no alcohol. Son & daughter; wife dementia [waiting for placement].    Social Determinants of Health   Financial Resource Strain: Not on file  Food Insecurity: Not on file  Transportation Needs: Not on file  Physical Activity: Not on file  Stress: Not on file  Social Connections: Not on file    ECOG Status: 1 - Symptomatic but completely ambulatory  Review of Systems  Review of Systems: A 12 point ROS discussed and pertinent positives are indicated in the HPI above.  All other systems are negative.  Physical Exam No direct physical exam was performed (except for noted visual exam findings with Video Visits).   Vital Signs: There were no vitals taken for this visit.  Imaging: DG Abdomen 1 View  Result Date: 11/08/2020 CLINICAL DATA:  Constipation EXAM: ABDOMEN - 1 VIEW COMPARISON:  11/07/2020 FINDINGS: The bowel gas pattern is normal. No radio-opaque calculi or other significant radiographic abnormality are seen. Bowel staples right abdomen. IMPRESSION: Negative. Electronically Signed   By: Franchot Gallo M.D.   On: 11/08/2020 07:59   CT HEAD WO CONTRAST  Result Date: 11/09/2020 CLINICAL DATA:  80 year old male with history of dizziness. EXAM: CT HEAD WITHOUT CONTRAST TECHNIQUE: Contiguous axial images were obtained from the base of the skull through the vertex without intravenous contrast. COMPARISON:  Head CT 03/06/2016. FINDINGS: Brain: Mild cerebral atrophy. Patchy and confluent areas of decreased attenuation are noted throughout the  deep and periventricular white matter of the cerebral hemispheres bilaterally, compatible with chronic microvascular ischemic disease. Well-defined areas of low attenuation are noted in the basal ganglia bilaterally, most evident in the head of the left caudate nucleus, indicative of old lacunar infarcts. No evidence of acute infarction, hemorrhage, hydrocephalus, extra-axial collection or mass lesion/mass effect. Vascular: No hyperdense vessel or unexpected calcification. Skull: Normal. Negative for fracture or focal lesion. Sinuses/Orbits: No acute finding. Other: None. IMPRESSION: 1. No acute intracranial abnormalities. 2. Mild cerebral atrophy with chronic microvascular ischemic changes in the cerebral white matter and old lacunar infarcts in the basal ganglia bilaterally, as above. Electronically Signed   By: Vinnie Langton M.D.   On: 11/09/2020 13:36   CT ABDOMEN PELVIS W CONTRAST  Result Date: 11/08/2020 CLINICAL DATA:  Lower abdominal pain, nausea and vomiting, and constipation for 4 days. Metastatic colon carcinoma, currently undergoing chemotherapy. EXAM: CT  ABDOMEN AND PELVIS WITH CONTRAST TECHNIQUE: Multidetector CT imaging of the abdomen and pelvis was performed using the standard protocol following bolus administration of intravenous contrast. CONTRAST:  165mL OMNIPAQUE IOHEXOL 300 MG/ML  SOLN COMPARISON:  09/24/2020 FINDINGS: Lower Chest: New atelectasis is seen in the posteromedial left lower lobe which partially obscures a 9 mm pulmonary nodule which was more clearly demonstrated on recent CT. New mild patchy infiltrate is seen in the inferior lingula. A new ill-defined nodular opacity is seen in the posterior right lower lobe which measures 10 mm on image 4/4, and this could represent infectious or inflammatory etiology or metastasis. Hepatobiliary: A hypovascular mass in the lateral segment of the left lobe currently measures 4.5 x 4.4 cm, compared to 3.9 x 3.6 cm previously. A smaller  low-attenuation lesion in the posterior right hepatic lobe currently measures 1.4 cm compared to 0.8 cm previously. A large cyst in the anterior segment of the right lobe is unchanged. Gallbladder is unremarkable. Mild diffuse biliary ductal dilatation remains stable. Pancreas: No mass or inflammatory changes. No evidence of pancreatic ductal dilatation. Spleen: Within normal limits in size and appearance. Adrenals/Urinary Tract: No masses identified. Left lower pole renal cyst shows no significant change. No evidence of ureteral calculi or hydronephrosis. Stomach/Bowel: No evidence of obstruction, inflammatory process or abnormal fluid collections. Surgical clips again seen in the right colon. A soft tissue nodule or lymph node is again seen adjacent to the surgical clips, which measures 1.7 x 1.5 cm and remains stable since previous study. Vascular/Lymphatic: Stable 1.7 cm right pericolonic lymph node or soft tissue density, as noted above. No other sites of lymphadenopathy identified. No abdominal aortic aneurysm. Aortic atherosclerotic calcification noted. Reproductive:  No mass or other significant abnormality. Other:  None. Musculoskeletal:  No suspicious bone lesions identified. IMPRESSION: Mild progression of liver metastases since prior exam. Stable 1.7 cm soft tissue nodule or lymph node adjacent to surgical clips in right colon. New atelectasis in posteromedial left lower lobe, which obscures a 9 mm pulmonary metastasis in this region on recent CT. New 1 cm ill-defined posterior right lower lobe nodule is nonspecific, and could represent infectious or inflammatory etiology or metastasis. Electronically Signed   By: Marlaine Hind M.D.   On: 11/08/2020 10:33   MR LIVER W WO CONTRAST  Result Date: 11/27/2020 CLINICAL DATA:  Metastatic colon carcinoma. Undergoing chemotherapy. EXAM: MRI ABDOMEN WITHOUT AND WITH CONTRAST TECHNIQUE: Multiplanar multisequence MR imaging of the abdomen was performed both before  and after the administration of intravenous contrast. CONTRAST:  80mL GADAVIST GADOBUTROL 1 MMOL/ML IV SOLN COMPARISON:  CT on 11/08/2020 FINDINGS: Lower chest: A sub-cm T2 hypointense nodule is seen in the posterior left lower lobe, which appears decreased in size since previous study. Hepatobiliary: A hypovascular mass is again seen in segments 2/3 measuring 4.4 x 4.2 cm on image 10/6. A smaller hypovascular lesion measuring 1.2 cm is seen in segment 6 on image 40/14. These are consistent with liver metastases and show no significant change in size compared to recent CT. A 7 cm benign-appearing cyst is again seen in the liver dome, also without change. Gallbladder is unremarkable. Diffuse biliary ductal dilatation is again seen with common bile duct measuring 1.2 cm. This is also unchanged, and there is no evidence of choledocholithiasis or other obstructing etiology. Pancreas: No mass or inflammatory changes. No evidence of pancreatic ductal dilatation. Spleen:  Within normal limits in size and appearance. Adrenals/Urinary Tract: No masses identified. Simple left renal cyst again  seen measuring approximately 4 cm. No evidence of hydronephrosis. Stomach/Bowel: Wall thickening and adjacent retroperitoneal edema is now seen involving the duodenal bulb and 2nd portion of the duodenum. An ulcer crater containing fluid and gas is also seen in the medial wall of the post bulbar duodenum measuring approximately 1.5 cm. Vascular/Lymphatic: No pathologically enlarged lymph nodes identified. No abdominal aortic aneurysm. Other: Tiny amount of free fluid and right paracolic gutter and more since pouch. Musculoskeletal:  No suspicious bone lesions identified. IMPRESSION: Stable hypovascular liver metastases. No new or progressive metastatic disease identified. Ulcer involving the post-bulbar duodenum, with adjacent retroperitoneal inflammatory changes. These results will be called to the ordering clinician or representative by  the Radiologist Assistant, and communication documented in the PACS or [employer]. No significant change in diffuse biliary ductal dilatation. No radiographic evidence of choledocholithiasis. This could be due edema involving the descending duodenum secondary to the ulcer described above. Sub-cm nodule in the posterior left lower lobe, decreased in size since previous study. Electronically Signed   By: Marlaine Hind M.D.   On: 11/27/2020 10:58   DG Chest Portable 1 View  Result Date: 11/08/2020 CLINICAL DATA:  Shortness of breath.  COVID in chemotherapy patient. EXAM: PORTABLE CHEST 1 VIEW COMPARISON:  09/24/2020 chest CT FINDINGS: Porta catheter with tip at the upper right atrium. Normal heart size and mediastinal contours. 13 mm nodule over the left mid lung. Linear opacity at the left base compatible with atelectasis. There is known pulmonary metastatic disease. IMPRESSION: No visible pneumonia. Electronically Signed   By: Monte Fantasia M.D.   On: 11/08/2020 08:46   DG Abd 2 Views  Result Date: 11/18/2020 CLINICAL DATA:  Diarrhea for 1.5 weeks. Abdominal pain for 2.5 weeks. Colon cancer. History of constipation. EXAM: ABDOMEN - 2 VIEW COMPARISON:  11/08/2020 CT FINDINGS: Upright and supine views. The upright view demonstrates no free intraperitoneal air or significant air-fluid levels. Supine views demonstrate no gaseous distention of bowel loops. Surgical sutures in the right side of the abdomen. No abnormal abdominal calcifications. No appendicolith. IMPRESSION: No acute findings. Electronically Signed   By: Abigail Miyamoto M.D.   On: 11/18/2020 16:46   DG Abd 2 Views  Result Date: 11/07/2020 CLINICAL DATA:  History of abdominal pain and bloating for 2 days, history of colon carcinoma with metastatic disease EXAM: ABDOMEN - 2 VIEW COMPARISON:  CT from 09/24/2020 FINDINGS: Scattered large and small bowel gas is noted. No free air is seen. No abnormal mass or abnormal calcifications are noted.  Postsurgical changes in the right colon are noted. Degenerative changes of lumbar spine are noted. IMPRESSION: No acute abnormality noted. Electronically Signed   By: Inez Catalina M.D.   On: 11/07/2020 15:43    Labs:  CBC: Recent Labs    11/08/20 0808 11/18/20 1354 11/19/20 0843 12/03/20 0859  WBC 8.5 4.7 4.1 4.4  HGB 14.8 11.9* 11.7* 11.8*  HCT 42.3 34.8* 34.4* 34.9*  PLT 188 164 158 147*    COAGS: No results for input(s): INR, APTT in the last 8760 hours.  BMP: Recent Labs    06/10/20 0858 06/25/20 0841 07/08/20 0843 07/22/20 0836 08/05/20 0821 11/08/20 0808 11/18/20 1354 11/19/20 0843 12/03/20 0859  NA 138 138 139 138   < > 138 133* 138 141  K 3.7 3.6 3.6 3.6   < > 4.2 3.8 3.8 3.8  CL 107 108 108 107   < > 100 104 106 109  CO2 25 24 25 25    < >  27 23 23 25   GLUCOSE 205* 201* 209* 195*   < > 163* 165* 199* 155*  BUN 11 10 9 10    < > 12 10 8 8   CALCIUM 8.2* 8.0* 8.0* 8.1*   < > 8.9 8.0* 8.2* 8.0*  CREATININE 0.89 0.80 0.70 0.89   < > 0.94 0.93 0.97 0.81  GFRNONAA >60 >60 >60 >60   < > >60 >60 >60 >60  GFRAA >60 >60 >60 >60  --   --   --   --   --    < > = values in this interval not displayed.    LIVER FUNCTION TESTS: Recent Labs    11/08/20 0808 11/18/20 1354 11/19/20 0843 12/03/20 0859  BILITOT 2.0* 0.9 0.6 0.8  AST 43* 28 31 35  ALT 23 16 14 19   ALKPHOS 93 107 107 109  PROT 7.3 6.5 6.2* 6.1*  ALBUMIN 2.9* 2.6* 2.6* 2.3*    TUMOR MARKERS: No results for input(s): AFPTM, CEA, CA199, CHROMGRNA in the last 8760 hours.  Assessment and Plan:  80 year-old gentleman with right-sided (cecal) colonic adenocarcinoma metastatic to the liver.  His disease is not surgically resectable and stable on FOLFIRI + Avastin.  While his liver disease remains stable, he is tolerating it poorly and is a candidate for liver directed therapy.   He has two lesions, one in the left liver (seg 2/3) measuring 4.4 x 4.2 cm, and the second in segment 6 measuring only 1.2 cm.   The larger lesion is amenable to combined embolization and microwave ablation while the smaller lesion is suitable for MWA alone.  Ablation offers the highest probability of prolonged local control.  If additional disease develops in the future, then Y90 can be considered as a palliative therapy.   1.) We will schedule for bland embolization of segment 2/3 lesion to be followed by MWA of both the seg 2/3 and seg 6 lesions 3-4 weeks later.  Procedures to be performed at [facility].   Thank you for this interesting consult.  I greatly enjoyed meeting LADARRELL CORNWALL and look forward to participating in their care.  A copy of this report was sent to the requesting provider on this date.  Electronically Signed: Criselda Peaches 12/05/2020, 11:19 AM   I spent a total of 40 Minutes  in remote  clinical consultation, greater than 50% of which was counseling/coordinating care for colon cancer metastatic to the liver.    Visit type: Audio only (telephone). Audio (no video) only due to patient preference. Alternative for in-person consultation at [facility], [address], [place], [place]. This visit type was conducted due to national recommendations for restrictions regarding the COVID-19 Pandemic (e.g. social distancing).  This format is felt to be most appropriate for this patient at this time.  All issues noted in this document were discussed and addressed.

## 2020-12-06 ENCOUNTER — Encounter: Payer: Self-pay | Admitting: Gastroenterology

## 2020-12-09 ENCOUNTER — Other Ambulatory Visit (HOSPITAL_COMMUNITY): Payer: Self-pay | Admitting: Interventional Radiology

## 2020-12-09 DIAGNOSIS — C189 Malignant neoplasm of colon, unspecified: Secondary | ICD-10-CM

## 2020-12-19 ENCOUNTER — Other Ambulatory Visit (HOSPITAL_COMMUNITY): Payer: Self-pay | Admitting: Student

## 2020-12-19 ENCOUNTER — Encounter (HOSPITAL_COMMUNITY): Payer: Self-pay

## 2020-12-19 ENCOUNTER — Other Ambulatory Visit: Payer: Self-pay

## 2020-12-19 ENCOUNTER — Other Ambulatory Visit (HOSPITAL_COMMUNITY): Payer: Self-pay | Admitting: Interventional Radiology

## 2020-12-19 ENCOUNTER — Ambulatory Visit (HOSPITAL_COMMUNITY)
Admission: RE | Admit: 2020-12-19 | Discharge: 2020-12-19 | Disposition: A | Discharge status: Home / Self Care | Payer: Medicare HMO | Source: Ambulatory Visit | Attending: Interventional Radiology | Admitting: Interventional Radiology

## 2020-12-19 DIAGNOSIS — E785 Hyperlipidemia, unspecified: Secondary | ICD-10-CM | POA: Insufficient documentation

## 2020-12-19 DIAGNOSIS — Z9660 Presence of unspecified orthopedic joint implant: Secondary | ICD-10-CM | POA: Diagnosis not present

## 2020-12-19 DIAGNOSIS — E119 Type 2 diabetes mellitus without complications: Secondary | ICD-10-CM | POA: Insufficient documentation

## 2020-12-19 DIAGNOSIS — Z79899 Other long term (current) drug therapy: Secondary | ICD-10-CM | POA: Insufficient documentation

## 2020-12-19 DIAGNOSIS — F17210 Nicotine dependence, cigarettes, uncomplicated: Secondary | ICD-10-CM | POA: Diagnosis not present

## 2020-12-19 DIAGNOSIS — C189 Malignant neoplasm of colon, unspecified: Secondary | ICD-10-CM | POA: Insufficient documentation

## 2020-12-19 DIAGNOSIS — I10 Essential (primary) hypertension: Secondary | ICD-10-CM | POA: Insufficient documentation

## 2020-12-19 DIAGNOSIS — Z888 Allergy status to other drugs, medicaments and biological substances status: Secondary | ICD-10-CM | POA: Diagnosis not present

## 2020-12-19 DIAGNOSIS — C787 Secondary malignant neoplasm of liver and intrahepatic bile duct: Secondary | ICD-10-CM | POA: Diagnosis not present

## 2020-12-19 HISTORY — PX: IR EMBO TUMOR ORGAN ISCHEMIA INFARCT INC GUIDE ROADMAPPING: IMG5449

## 2020-12-19 HISTORY — PX: IR ANGIOGRAM VISCERAL SELECTIVE: IMG657

## 2020-12-19 HISTORY — PX: IR ANGIOGRAM SELECTIVE EACH ADDITIONAL VESSEL: IMG667

## 2020-12-19 HISTORY — PX: IR US GUIDE VASC ACCESS RIGHT: IMG2390

## 2020-12-19 LAB — CBC
HCT: 38.2 % — ABNORMAL LOW (ref 39.0–52.0)
Hemoglobin: 12.4 g/dL — ABNORMAL LOW (ref 13.0–17.0)
MCH: 33.2 pg (ref 26.0–34.0)
MCHC: 32.5 g/dL (ref 30.0–36.0)
MCV: 102.4 fL — ABNORMAL HIGH (ref 80.0–100.0)
Platelets: 127 10*3/uL — ABNORMAL LOW (ref 150–400)
RBC: 3.73 MIL/uL — ABNORMAL LOW (ref 4.22–5.81)
RDW: 15.7 % — ABNORMAL HIGH (ref 11.5–15.5)
WBC: 4.4 10*3/uL (ref 4.0–10.5)
nRBC: 0 % (ref 0.0–0.2)

## 2020-12-19 LAB — COMPREHENSIVE METABOLIC PANEL
ALT: 20 U/L (ref 0–44)
AST: 34 U/L (ref 15–41)
Albumin: 3 g/dL — ABNORMAL LOW (ref 3.5–5.0)
Alkaline Phosphatase: 118 U/L (ref 38–126)
Anion gap: 9 (ref 5–15)
BUN: 9 mg/dL (ref 8–23)
CO2: 24 mmol/L (ref 22–32)
Calcium: 8.5 mg/dL — ABNORMAL LOW (ref 8.9–10.3)
Chloride: 107 mmol/L (ref 98–111)
Creatinine, Ser: 0.69 mg/dL (ref 0.61–1.24)
GFR, Estimated: >60 mL/min (ref >60)
Glucose, Bld: 122 mg/dL — ABNORMAL HIGH (ref 70–99)
Potassium: 3.9 mmol/L (ref 3.5–5.1)
Sodium: 140 mmol/L (ref 135–145)
Total Bilirubin: 1.2 mg/dL (ref 0.3–1.2)
Total Protein: 6.9 g/dL (ref 6.5–8.1)

## 2020-12-19 LAB — PROTIME-INR
INR: 1 (ref 0.8–1.2)
Prothrombin Time: 12.6 seconds (ref 11.4–15.2)

## 2020-12-19 LAB — GLUCOSE, CAPILLARY: Glucose-Capillary: 111 mg/dL — ABNORMAL HIGH (ref 70–99)

## 2020-12-19 IMAGING — XA IR EMBO TUMOR ORGAN ISCHEMIA INFARCT INC GUIDE ROADMAPPING
8 of 9 series · 13 of 24 positions shown · IV contrast (IODINE)
Comparison: none

INDICATION: 79-year-old male with right-sided colon cancer metastatic to the
liver. He presents for planned hepatic arteriogram and bland
embolization of the lesion in the left hepatic lobe as part of the
stage therapy prior to microwave embolization.

[Series 1: care single · 1 of 1 slices shown]
[im 1/1]
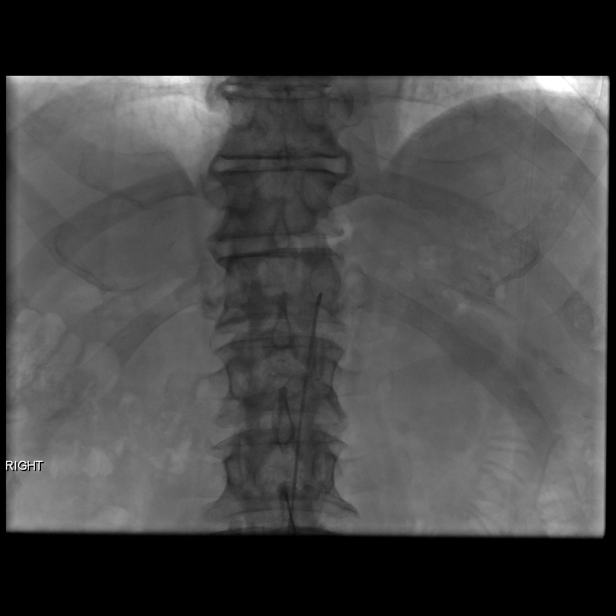

[Series 2: care body 4 · 2 of 19 frames shown (1 of 7)]
[frame 10/19]
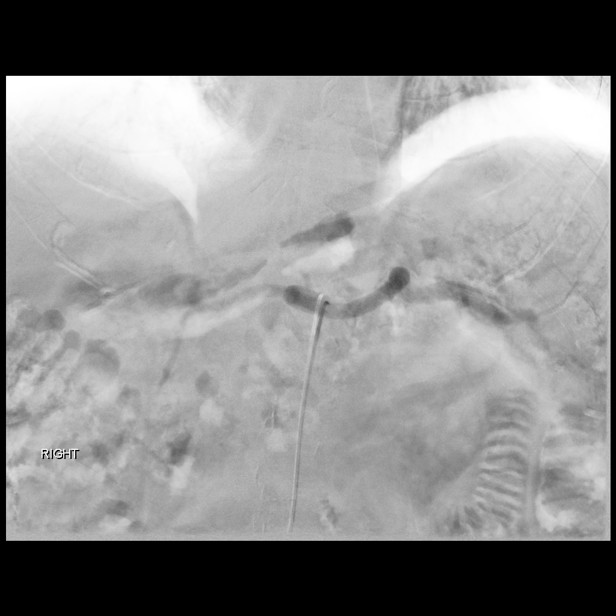
[frame 17/19]
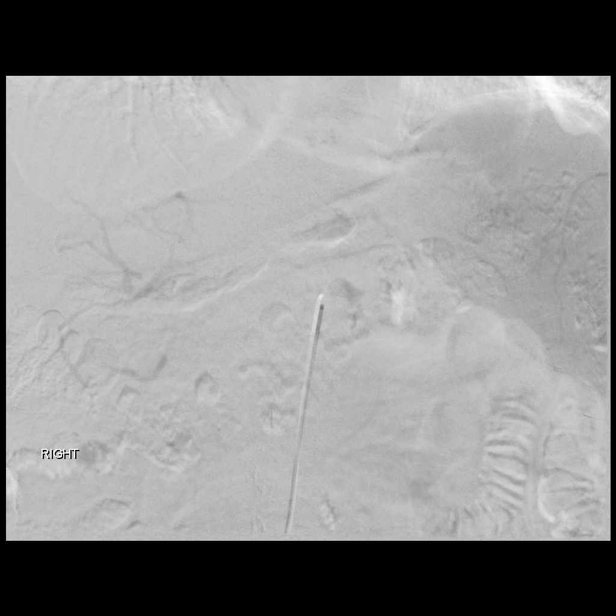

[Series 3: care body 4 · 1 of 55 frames shown (2 of 7)]
[frame 47/55]
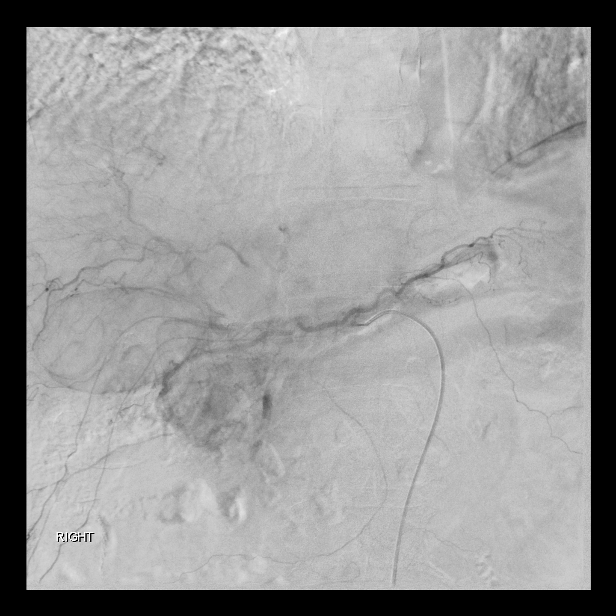

[Series 4: care body 4 · 2 of 39 frames shown (3 of 7)]
[frame 6/39]
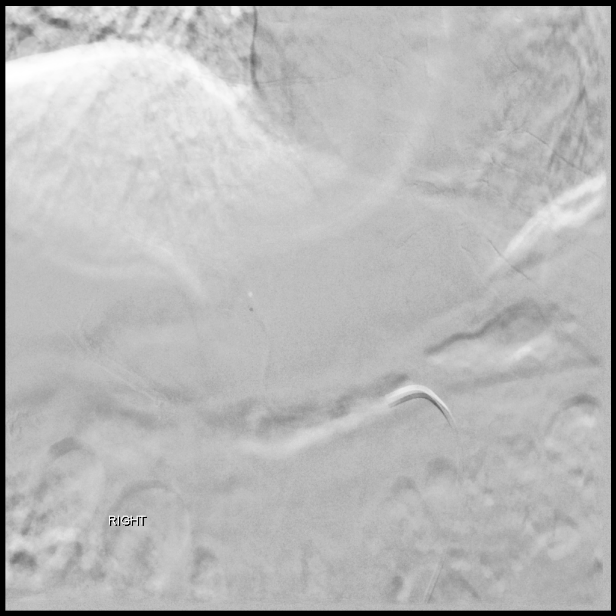
[frame 34/39]
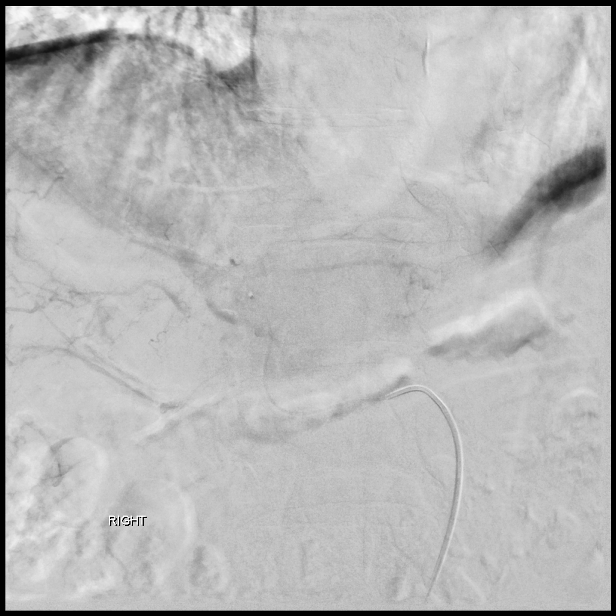

[Series 5: care body 4 · 2 of 33 frames shown (4 of 7)]
[frame 17/33]
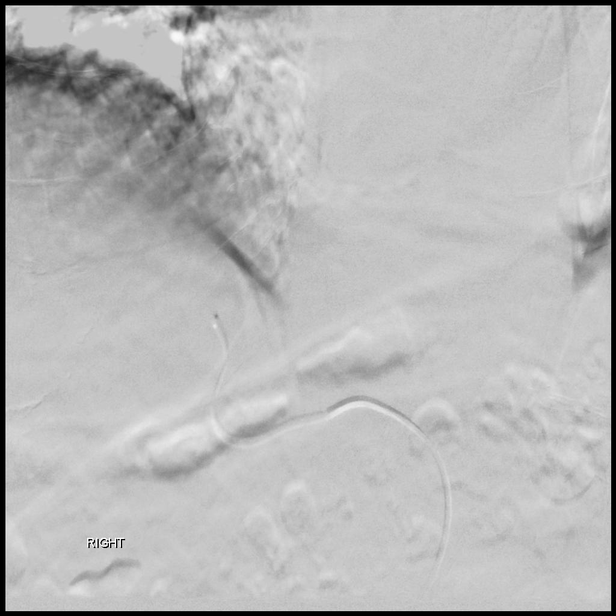
[frame 29/33]
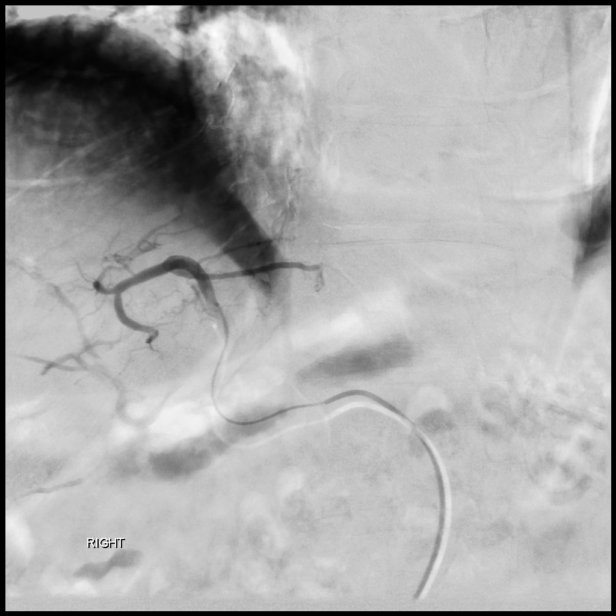

[Series 6: care body 4 · 2 of 24 frames shown (5 of 7)]
[frame 13/24]
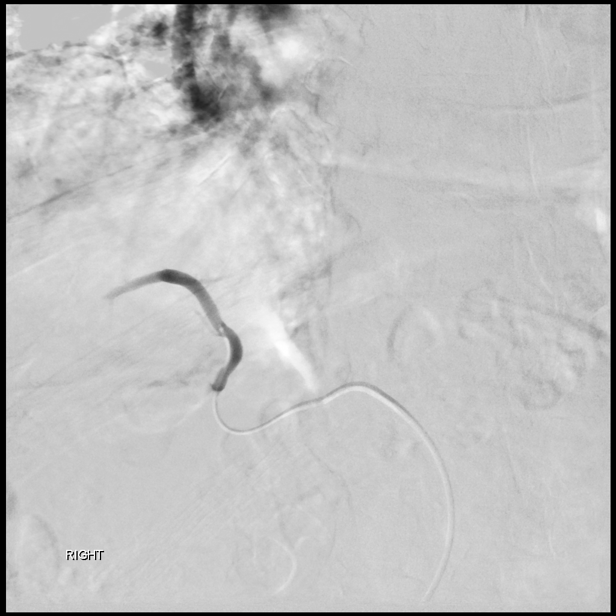
[frame 23/24]
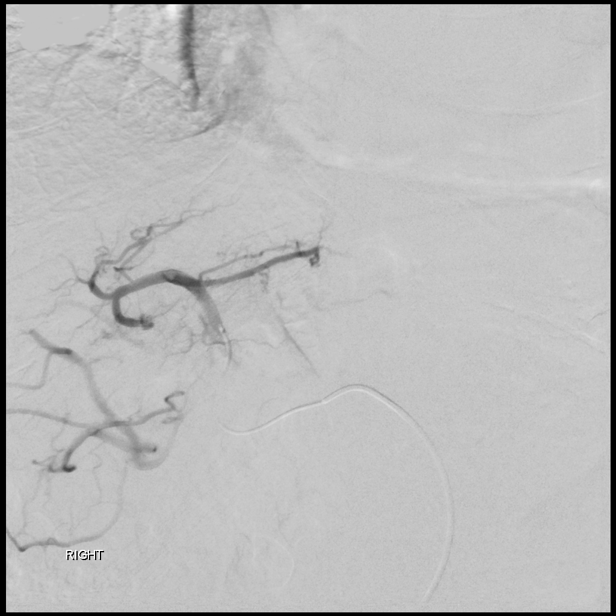

[Series 7: care body 4 · 1 of 58 frames shown (6 of 7)]
[frame 43/58]
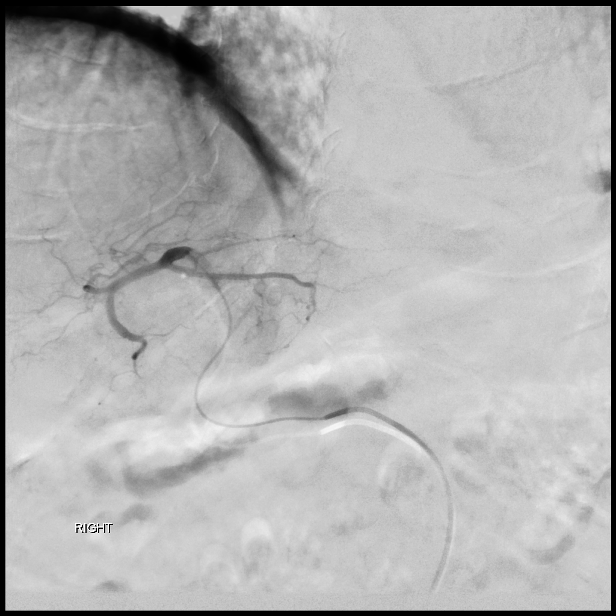

[Series 8: care body 4 · 2 of 49 frames shown (7 of 7)]
[frame 8/49]
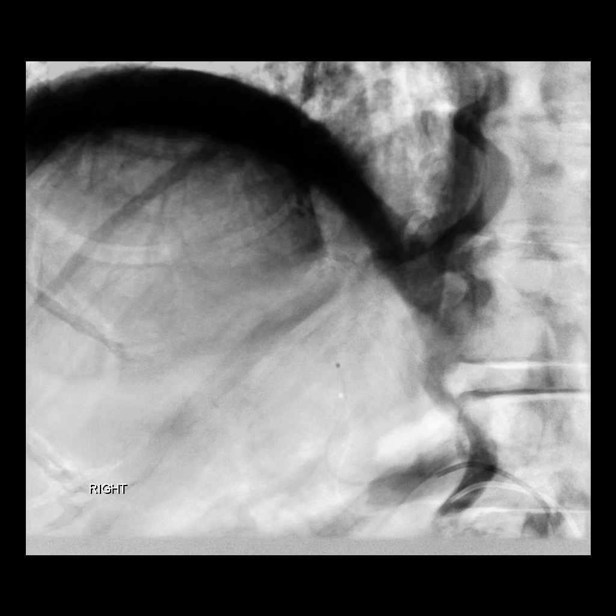
[frame 43/49]
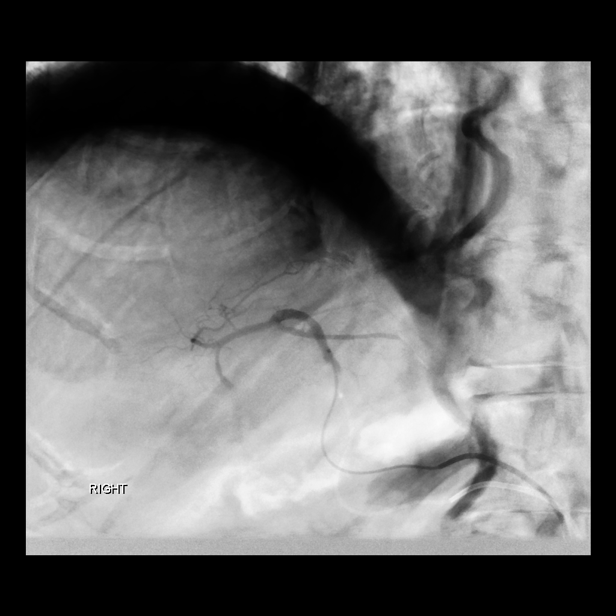

[13 of 24 positions shown; findings below may reference images not displayed]

EXAM:
IR ULTRASOUND GUIDANCE VASC ACCESS RIGHT; IR EMBO TUMOR ORGAN
ISCHEMIA INFARCT INC GUIDE ROADMAPPING; ADDITIONAL ARTERIOGRAPHY;
SELECTIVE VISCERAL ARTERIOGRAPHY

MEDICATIONS:
2 g cefoxitin. The antibiotic was administered within 1 hour of the
procedure

ANESTHESIA/SEDATION:
Moderate (conscious) sedation was employed during this procedure. A
total of Versed 2 mg and Fentanyl 100 mcg was administered
intravenously.

Moderate Sedation Time: 39 minutes. The patient's level of
consciousness and vital signs were monitored continuously by
radiology nursing throughout the procedure under my direct
supervision.

CONTRAST:  40mL OMNIPAQUE IOHEXOL 300 MG/ML SOLN, 16mL OMNIPAQUE
IOHEXOL 300 MG/ML SOLN

FLUOROSCOPY TIME:  Fluoroscopy Time: 5 minutes 18 seconds (763 mGy).

COMPLICATIONS:
None immediate.



The right common femoral artery was interrogated with ultrasound and
found to be widely patent. An image was obtained and stored for the
medical record. Local anesthesia was attained by infiltration with
1% lidocaine. A small dermatotomy was made. Under real-time
sonographic guidance, the vessel was punctured with a 21 gauge
micropuncture needle. Using standard technique, the initial micro
needle was exchanged over a 0.018 micro wire for a transitional 4
French micro sheath. The micro sheath was then exchanged over a
0.035 wire for a 5 French vascular sheath.

A C2 cobra catheter was advanced over a Bentson wire into the
abdominal aorta. The catheter was used to select the celiac axis. An
initial CT arteriogram was performed. There appears to be
conventional hepatic arterial anatomy.

The C2 cobra catheter was advanced into the common hepatic artery
over a glidewire. Additional digital subtraction arteriography was
performed. The right gastric artery arises from the proximal
gastroduodenal artery. The proper hepatic artery is relatively
short. The left hepatic artery is easily identified. The middle
hepatic artery arises from the proximal right hepatic artery.

A renegade SIAU microcatheter was then advanced coaxially through
the 5 French catheter over a Fathom 16 wire. The catheter was
advanced into the left hepatic artery. Arteriography was performed
in multiple projections. Microcatheter was advanced more distally in
the left hepatic artery. Ultimately, contrast injections demonstrate
excellent opacification of the segment 2 and segment 3 hepatic
arteries. There is faint tumor blush around the peripheral aspect of
the known mass which straddles segments 2 and 3.

The microcatheter was secured in position. Bland embolization was
then performed using approximately 50% of a vial of 40-120 micron
embospheres. Embolization was taken to near stasis. Post
embolization arteriography demonstrates significant pruning of the
tumoral vessels. The catheter system was removed. Hemostasis was
attained with the assistance of a 5 French Celt closure device.
IMPRESSION: Successful bland embolization of metastatic colon cancer within
hepatic segments 2 and 3.

## 2020-12-19 MED ORDER — SODIUM CHLORIDE 0.9 % IV SOLN: INTRAVENOUS | Status: DC

## 2020-12-19 MED ORDER — LIDOCAINE HCL (PF) 1 % IJ SOLN
INTRAMUSCULAR | Status: AC | PRN
  Administered 2020-12-19: 5 mL

## 2020-12-19 MED ORDER — LIDOCAINE HCL 1 % IJ SOLN
INTRAMUSCULAR | Status: AC
  Filled 2020-12-19: qty 20

## 2020-12-19 MED ORDER — OXYCODONE HCL 5 MG PO TABS: 5.0000 mg | ORAL_TABLET | Freq: Four times a day (QID) | ORAL | 0 refills | Status: DC | PRN

## 2020-12-19 MED ORDER — FENTANYL CITRATE (PF) 100 MCG/2ML IJ SOLN
INTRAMUSCULAR | Status: AC
  Filled 2020-12-19: qty 2

## 2020-12-19 MED ORDER — SODIUM CHLORIDE 0.9 % IV SOLN
2.0000 g | Freq: Once | INTRAVENOUS | Status: AC
  Administered 2020-12-19: 2 g via INTRAVENOUS
  Filled 2020-12-19: qty 2

## 2020-12-19 MED ORDER — IOHEXOL 300 MG/ML  SOLN
100.0000 mL | Freq: Once | INTRAMUSCULAR | Status: AC | PRN
  Administered 2020-12-19: 40 mL via INTRA_ARTERIAL

## 2020-12-19 MED ORDER — MIDAZOLAM HCL 2 MG/2ML IJ SOLN
INTRAMUSCULAR | Status: AC | PRN
  Administered 2020-12-19: 0.5 mg via INTRAVENOUS
  Administered 2020-12-19: 1 mg via INTRAVENOUS
  Administered 2020-12-19: 0.5 mg via INTRAVENOUS

## 2020-12-19 MED ORDER — ONDANSETRON HCL 8 MG PO TABS: 8.0000 mg | ORAL_TABLET | Freq: Three times a day (TID) | ORAL | 0 refills | Status: DC | PRN

## 2020-12-19 MED ORDER — MOXIFLOXACIN HCL 400 MG PO TABS: ORAL_TABLET | ORAL | 0 refills | Status: DC

## 2020-12-19 MED ORDER — IOHEXOL 300 MG/ML  SOLN
100.0000 mL | Freq: Once | INTRAMUSCULAR | Status: AC | PRN
  Administered 2020-12-19: 16 mL via INTRA_ARTERIAL

## 2020-12-19 MED ORDER — MIDAZOLAM HCL 2 MG/2ML IJ SOLN
INTRAMUSCULAR | Status: AC
  Filled 2020-12-19: qty 4

## 2020-12-19 MED ORDER — FENTANYL CITRATE (PF) 100 MCG/2ML IJ SOLN
INTRAMUSCULAR | Status: AC | PRN
  Administered 2020-12-19: 50 ug via INTRAVENOUS
  Administered 2020-12-19 (×2): 25 ug via INTRAVENOUS

## 2020-12-19 NOTE — H&P (Addendum)
Chief Complaint: Patient was seen in consultation today for liver lesion/bland embolization.  Referring Physician(s): Cammie Sickle (oncology)  Supervising Physician: Jacqulynn Cadet  Patient Status: [facility] - Out-pt  History of Present Illness: Oscar [name] is a 80 y.o. male with a past medical history of hypertension, hyperlipidemia, PUD, metastatic colon cancer, diabetes mellitus, arthritis, and current tobacco use. He was unfortunately diagnosed with metastatic colon cancer (with hepatic disease) in 02/2019. His cancer is managed by Dr. Rogue Bussing. In 02/2019, he underwent right hemicolectomy for management. He has completed multiple rounds of systemic chemotherapy. Most recent follow-up MRI revealed stable hepatic disease (two liver lesions- one in segment 2/3 and on in segment 6)- he was then referred to IR for possible liver directed therapy. He consulted with Dr. Laurence Ferrari 12/05/2020 to discuss management options of his two liver lesions. At that time, through shared decision making, patient decided to pursue bland embolization of segment 2/3 liver lesion, followed by microwave ablation of both liver lesions 3-4 weeks following this.  Patient presents today for possible image-guided mesenteric arteriogram with possible bland embolization of liver lesion (segment 2/3 lesion). Patient awake and alert laying in bed with no complaints at this time. Denies fever, chills, chest pain, dyspnea, abdominal pain, or headache.   Past Medical History:  Diagnosis Date  . Arthritis   . Cancer ([place])    colon cancer 02/2019 per pt   . Diabetes mellitus without complication ([place])   . H/O colon cancer, stage IV   . Hyperlipemia   . Hypertension     Past Surgical History:  Procedure Laterality Date  . IR IMAGING GUIDED PORT INSERTION  07/20/2019  . IR RADIOLOGIST EVAL & MGMT  12/05/2020  . JOINT REPLACEMENT      Allergies: Ace inhibitors  Medications: Prior to Admission  medications   Medication Sig Start Date End Date Taking? Authorizing Provider  amLODipine (NORVASC) 5 MG tablet Take 5 mg by mouth daily.    [provider]  meloxicam (MOBIC) 15 MG tablet Take 1 tablet (15 mg total) by mouth daily. 09/30/20   Cammie Sickle, MD  nystatin (MYCOSTATIN) 100000 UNIT/ML suspension Take 5 mLs (500,000 Units total) by mouth 4 (four) times daily. 11/05/20   Cammie Sickle, MD  ondansetron (ZOFRAN ODT) 8 MG disintegrating tablet Take 1 tablet (8 mg total) by mouth every 8 (eight) hours as needed for nausea or vomiting. 11/19/20   Cammie Sickle, MD  oxyCODONE (OXY IR/ROXICODONE) 5 MG immediate release tablet Take 1 tablet (5 mg total) by mouth every 6 (six) hours as needed for severe pain. Patient not taking: Reported on 12/04/2020 11/21/20   Borders, Kirt Boys, NP  pantoprazole (PROTONIX) 40 MG tablet Take 1 tablet (40 mg total) by mouth 2 (two) times daily. 11/27/20   Cammie Sickle, MD  pravastatin (PRAVACHOL) 40 MG tablet Take 40 mg by mouth daily.     [provider]  prochlorperazine (COMPAZINE) 10 MG tablet Take 1 tablet (10 mg total) by mouth every 6 (six) hours as needed for nausea or vomiting. Patient not taking: Reported on 12/04/2020 11/21/20   Borders, Kirt Boys, NP  sucralfate (CARAFATE) 1 g tablet Take 1 tablet (1 g total) by mouth 4 (four) times daily -  with meals and at bedtime. 11/27/20   Cammie Sickle, MD  tamsulosin (FLOMAX) 0.4 MG CAPS capsule Take 1 capsule (0.4 mg total) by mouth daily. 03/15/20 03/15/21  Borders, Kirt Boys, NP  Family History  Problem Relation Age of Onset  . Peptic Ulcer Disease Father     Social History   Socioeconomic History  . Marital status: Married    Spouse name: Not on file  . Number of children: Not on file  . Years of education: Not on file  . Highest education level: Not on file  Occupational History  . Not on file  Tobacco Use  . Smoking status: Current Every Day  Smoker    Packs/day: 0.25    Types: Cigarettes  . Smokeless tobacco: Never Used  . Tobacco comment: 1 to 2 cigarettes a day occasionally  Vaping Use  . Vaping Use: Never used  Substance and Sexual Activity  . Alcohol use: No  . Drug use: No  . Sexual activity: Not on file  Other Topics Concern  . Not on file  Social History Narrative   Recruitment consultant retd; lives in [place]; smoking 3cig/day; [3/4 ppd x started at 9 years]; no alcohol. Son & daughter; wife dementia [waiting for placement].    Social Determinants of Health   Financial Resource Strain: Not on file  Food Insecurity: Not on file  Transportation Needs: Not on file  Physical Activity: Not on file  Stress: Not on file  Social Connections: Not on file     Review of Systems: A 12 point ROS discussed and pertinent positives are indicated in the HPI above.  All other systems are negative.  Review of Systems  Constitutional: Negative for chills and fever.  Respiratory: Negative for shortness of breath and wheezing.   Cardiovascular: Negative for chest pain and palpitations.  Gastrointestinal: Negative for abdominal pain.  Neurological: Negative for headaches.  Psychiatric/Behavioral: Negative for behavioral problems and confusion.    Vital Signs: BP (!) 176/89 (BP Location: Right Arm)   Pulse 75   Temp 97.8 F (36.6 C) (Oral)   Resp 16   SpO2 100%   Physical Exam Vitals and nursing note reviewed.  Constitutional:      General: He is not in acute distress.    Appearance: Normal appearance.  Cardiovascular:     Rate and Rhythm: Normal rate and regular rhythm.     Heart sounds: Normal heart sounds. No murmur heard.   Pulmonary:     Effort: Pulmonary effort is normal. No respiratory distress.     Breath sounds: Wheezing present.  Abdominal:     Palpations: Abdomen is soft.     Tenderness: There is no abdominal tenderness.  Skin:    General: Skin is warm and dry.  Neurological:      Mental Status: He is alert and oriented to person, place, and time.      MD Evaluation Airway: WNL Heart: WNL Abdomen: WNL Chest/ Lungs: WNL ASA  Classification: 3 Mallampati/Airway Score: One   Imaging: MR LIVER W WO CONTRAST  Result Date: 11/27/2020 CLINICAL DATA:  Metastatic colon carcinoma. Undergoing chemotherapy. EXAM: MRI ABDOMEN WITHOUT AND WITH CONTRAST TECHNIQUE: Multiplanar multisequence MR imaging of the abdomen was performed both before and after the administration of intravenous contrast. CONTRAST:  93mL GADAVIST GADOBUTROL 1 MMOL/ML IV SOLN COMPARISON:  CT on 11/08/2020 FINDINGS: Lower chest: A sub-cm T2 hypointense nodule is seen in the posterior left lower lobe, which appears decreased in size since previous study. Hepatobiliary: A hypovascular mass is again seen in segments 2/3 measuring 4.4 x 4.2 cm on image 10/6. A smaller hypovascular lesion measuring 1.2 cm is seen in segment 6 on image 40/14. These are  consistent with liver metastases and show no significant change in size compared to recent CT. A 7 cm benign-appearing cyst is again seen in the liver dome, also without change. Gallbladder is unremarkable. Diffuse biliary ductal dilatation is again seen with common bile duct measuring 1.2 cm. This is also unchanged, and there is no evidence of choledocholithiasis or other obstructing etiology. Pancreas: No mass or inflammatory changes. No evidence of pancreatic ductal dilatation. Spleen:  Within normal limits in size and appearance. Adrenals/Urinary Tract: No masses identified. Simple left renal cyst again seen measuring approximately 4 cm. No evidence of hydronephrosis. Stomach/Bowel: Wall thickening and adjacent retroperitoneal edema is now seen involving the duodenal bulb and 2nd portion of the duodenum. An ulcer crater containing fluid and gas is also seen in the medial wall of the post bulbar duodenum measuring approximately 1.5 cm. Vascular/Lymphatic: No pathologically  enlarged lymph nodes identified. No abdominal aortic aneurysm. Other: Tiny amount of free fluid and right paracolic gutter and more since pouch. Musculoskeletal:  No suspicious bone lesions identified. IMPRESSION: Stable hypovascular liver metastases. No new or progressive metastatic disease identified. Ulcer involving the post-bulbar duodenum, with adjacent retroperitoneal inflammatory changes. These results will be called to the ordering clinician or representative by the Radiologist Assistant, and communication documented in the PACS or [employer]. No significant change in diffuse biliary ductal dilatation. No radiographic evidence of choledocholithiasis. This could be due edema involving the descending duodenum secondary to the ulcer described above. Sub-cm nodule in the posterior left lower lobe, decreased in size since previous study. Electronically Signed   By: Marlaine Hind M.D.   On: 11/27/2020 10:58   IR Radiologist Eval & Mgmt  Result Date: 12/05/2020 Please refer to notes tab for details about interventional procedure. (Op Note)   Labs:  CBC: Recent Labs    11/18/20 1354 11/19/20 0843 12/03/20 0859 12/19/20 0954  WBC 4.7 4.1 4.4 4.4  HGB 11.9* 11.7* 11.8* 12.4*  HCT 34.8* 34.4* 34.9* 38.2*  PLT 164 158 147* 127*    COAGS: Recent Labs    12/19/20 0954  INR 1.0    BMP: Recent Labs    06/10/20 0858 06/25/20 0841 07/08/20 0843 07/22/20 0836 08/05/20 0821 11/18/20 1354 11/19/20 0843 12/03/20 0859 12/19/20 0954  NA 138 138 139 138   < > 133* 138 141 140  K 3.7 3.6 3.6 3.6   < > 3.8 3.8 3.8 3.9  CL 107 108 108 107   < > 104 106 109 107  CO2 25 24 25 25    < > 23 23 25 24   GLUCOSE 205* 201* 209* 195*   < > 165* 199* 155* 122*  BUN 11 10 9 10    < > 10 8 8 9   CALCIUM 8.2* 8.0* 8.0* 8.1*   < > 8.0* 8.2* 8.0* 8.5*  CREATININE 0.89 0.80 0.70 0.89   < > 0.93 0.97 0.81 0.69  GFRNONAA >60 >60 >60 >60   < > >60 >60 >60 >60  GFRAA >60 >60 >60 >60  --   --   --   --    --    < > = values in this interval not displayed.    LIVER FUNCTION TESTS: Recent Labs    11/18/20 1354 11/19/20 0843 12/03/20 0859 12/19/20 0954  BILITOT 0.9 0.6 0.8 1.2  AST 28 31 35 34  ALT 16 14 19 20   ALKPHOS 107 107 109 118  PROT 6.5 6.2* 6.1* 6.9  ALBUMIN 2.6* 2.6* 2.3* 3.0*  Assessment and Plan:  History of metastatic colon cancer with two liver lesions (one in segment 2/3 and on in segment 6). Plan for image-guided mesenteric arteriogram with possible bland embolization of liver lesion (one in segment 2/3) today in IR. Patient is NPO. Afebrile and WBCs WNL. He does not take blood thinners. INR 1.0 today.  The risks and benefits of embolization were discussed with the patient including, but not limited to bleeding, infection, vascular injury, post operative pain, or contrast induced renal failure. This procedure involves the use of X-rays and because of the nature of the planned procedure, it is possible that we will have prolonged use of X-ray fluoroscopy. Potential radiation risks to you include (but are not limited to) the following: - A slightly elevated risk for cancer several years later in life. This risk is typically less than 0.5% percent. This risk is low in comparison to the normal incidence of human cancer, which is 33% for women and 50% for men according to the [employer]. - Radiation induced injury can include skin redness, resembling a rash, tissue breakdown / ulcers and hair loss (which can be temporary or permanent).  The likelihood of either of these occurring depends on the difficulty of the procedure and whether you are sensitive to radiation due to previous procedures, disease, or genetic conditions.  IF your procedure requires a prolonged use of radiation, you will be notified and given written instructions for further action.  It is your responsibility to monitor the irradiated area for the 2 weeks following the procedure and to  notify your physician if you are concerned that you have suffered a radiation induced injury.   All of the patient's questions were answered, patient is agreeable to proceed. Consent signed and in chart.   Thank you for this interesting consult.  I greatly enjoyed meeting MCGWIRE DASARO and look forward to participating in their care.  A copy of this report was sent to the requesting provider on this date.  Electronically Signed: Earley Abide, PA-C 12/19/2020, 10:22 AM   I spent a total of 40 Minutes in face to face in clinical consultation, greater than 50% of which was counseling/coordinating care for liver lesion/bland embolization.

## 2020-12-19 NOTE — Discharge Instructions (Signed)
Femoral Site Care  This sheet gives you information about how to care for yourself after your procedure. Your health care provider may also give you more specific instructions. If you have problems or questions, contact your health care provider. What can I expect after the procedure? After the procedure, it is common to have:  Bruising that usually fades within 1-2 weeks.  Tenderness at the site. Follow these instructions at home: Wound care  Follow instructions from your health care provider about how to take care of your insertion site. Make sure you: ? Wash your hands with soap and water before you change your bandage (dressing). If soap and water are not available, use hand sanitizer. ? Change your dressing as told by your health care provider. ? Leave stitches (sutures), skin glue, or adhesive strips in place. These skin closures may need to stay in place for 2 weeks or longer. If adhesive strip edges start to loosen and curl up, you may trim the loose edges. Do not remove adhesive strips completely unless your health care provider tells you to do that.  Do not take baths, swim, or use a hot tub until your health care provider approves.  You may shower 24-48 hours after the procedure or as told by your health care provider. ? Gently wash the site with plain soap and water. ? Pat the area dry with a clean towel. ? Do not rub the site. This may cause bleeding.  Do not apply powder or lotion to the site. Keep the site clean and dry.  Check your femoral site every day for signs of infection. Check for: ? Redness, swelling, or pain. ? Fluid or blood. ? Warmth. ? Pus or a bad smell. Activity  For the first 2-3 days after your procedure, or as long as directed: ? Avoid climbing stairs as much as possible. ? Do not squat.  Do not lift anything that is heavier than 10 lb (4.5 kg), or the limit that you are told, until your health care provider says that it is safe.  Rest as  directed. ? Avoid sitting for a long time without moving. Get up to take short walks every 1-2 hours.  Do not drive for 24 hours if you were given a medicine to help you relax (sedative). General instructions  Take over-the-counter and prescription medicines only as told by your health care provider.  Keep all follow-up visits as told by your health care provider. This is important. Contact a health care provider if you have:  A fever or chills.  You have redness, swelling, or pain around your insertion site. Get help right away if:  The catheter insertion area swells very fast.  You pass out.  You suddenly start to sweat or your skin gets clammy.  The catheter insertion area is bleeding, and the bleeding does not stop when you hold steady pressure on the area.  The area near or just beyond the catheter insertion site becomes pale, cool, tingly, or numb. These symptoms may represent a serious problem that is an emergency. Do not wait to see if the symptoms will go away. Get medical help right away. Call your local emergency services (911 in the [place] Do not drive yourself to the hospital. Summary  After the procedure, it is common to have bruising that usually fades within 1-2 weeks.  Check your femoral site every day for signs of infection.  Do not lift anything that is heavier than 10 lb (4.5 kg), or   the limit that you are told, until your health care provider says that it is safe. This information is not intended to replace advice given to you by your health care provider. Make sure you discuss any questions you have with your health care provider.   Moderate Conscious Sedation, Adult, Care After This sheet gives you information about how to care for yourself after your procedure. Your health care provider may also give you more specific instructions. If you have problems or questions, contact your health care provider. What can I expect after the procedure? After the  procedure, it is common to have:  Sleepiness for several hours.  Impaired judgment for several hours.  Difficulty with balance.  Vomiting if you eat too soon. Follow these instructions at home: For the time period you were told by your health care provider:  Rest.  Do not participate in activities where you could fall or become injured.  Do not drive or use machinery.  Do not drink alcohol.  Do not take sleeping pills or medicines that cause drowsiness.  Do not make important decisions or sign legal documents.  Do not take care of children on your own.      Eating and drinking  Follow the diet recommended by your health care provider.  Drink enough fluid to keep your urine pale yellow.  If you vomit: ? Drink water, juice, or soup when you can drink without vomiting. ? Make sure you have little or no nausea before eating solid foods.   General instructions  Take over-the-counter and prescription medicines only as told by your health care provider.  Have a responsible adult stay with you for the time you are told. It is important to have someone help care for you until you are awake and alert.  Do not smoke.  Keep all follow-up visits as told by your health care provider. This is important. Contact a health care provider if:  You are still sleepy or having trouble with balance after 24 hours.  You feel light-headed.  You keep feeling nauseous or you keep vomiting.  You develop a rash.  You have a fever.  You have redness or swelling around the IV site. Get help right away if:  You have trouble breathing.  You have new-onset confusion at home. Summary  After the procedure, it is common to feel sleepy, have impaired judgment, or feel nauseous if you eat too soon.  Rest after you get home. Know the things you should not do after the procedure.  Follow the diet recommended by your health care provider and drink enough fluid to keep your urine pale  yellow.  Get help right away if you have trouble breathing or new-onset confusion at home. This information is not intended to replace advice given to you by your health care provider. Make sure you discuss any questions you have with your health care provider. Document Revised: 02/02/2020 Document Reviewed: 08/31/2019 Elsevier Patient Education  2021 [employer].

## 2020-12-19 NOTE — Procedures (Signed)
Interventional Radiology Procedure Note  Procedure: Hepatic arteriogram and particle embolization of left hepatic mass.   Complications: None  Estimated Blood Loss: None  Recommendations: - DC home in 1 hr - Moxifloxacin 400 mg daily x 7 days  Signed,  Criselda Peaches, MD

## 2020-12-21 DIAGNOSIS — Z20822 Contact with and (suspected) exposure to covid-19: Secondary | ICD-10-CM | POA: Diagnosis not present

## 2020-12-24 ENCOUNTER — Other Ambulatory Visit
Admission: RE | Admit: 2020-12-24 | Discharge: 2020-12-24 | Disposition: A | Discharge status: Home / Self Care | Payer: Medicare HMO | Source: Ambulatory Visit | Attending: Gastroenterology | Admitting: Gastroenterology

## 2020-12-24 ENCOUNTER — Other Ambulatory Visit: Payer: Self-pay

## 2020-12-24 DIAGNOSIS — Z20822 Contact with and (suspected) exposure to covid-19: Secondary | ICD-10-CM | POA: Diagnosis not present

## 2020-12-24 DIAGNOSIS — Z01812 Encounter for preprocedural laboratory examination: Secondary | ICD-10-CM | POA: Diagnosis not present

## 2020-12-25 LAB — SARS CORONAVIRUS 2 (TAT 6-24 HRS): SARS Coronavirus 2: NEGATIVE

## 2020-12-26 ENCOUNTER — Encounter: Payer: Self-pay | Admitting: Gastroenterology

## 2020-12-26 ENCOUNTER — Other Ambulatory Visit: Payer: Self-pay

## 2020-12-26 ENCOUNTER — Ambulatory Visit: Payer: Medicare HMO | Admitting: Anesthesiology

## 2020-12-26 ENCOUNTER — Encounter
Admission: RE | Disposition: A | Discharge status: Home / Self Care | Payer: Self-pay | Source: Home / Self Care | Attending: Gastroenterology

## 2020-12-26 ENCOUNTER — Ambulatory Visit
Admission: RE | Admit: 2020-12-26 | Discharge: 2020-12-26 | Disposition: A | Discharge status: Home / Self Care | Payer: Medicare HMO | Attending: Gastroenterology | Admitting: Gastroenterology

## 2020-12-26 DIAGNOSIS — F17210 Nicotine dependence, cigarettes, uncomplicated: Secondary | ICD-10-CM | POA: Diagnosis not present

## 2020-12-26 DIAGNOSIS — K269 Duodenal ulcer, unspecified as acute or chronic, without hemorrhage or perforation: Secondary | ICD-10-CM | POA: Insufficient documentation

## 2020-12-26 DIAGNOSIS — Z791 Long term (current) use of non-steroidal anti-inflammatories (NSAID): Secondary | ICD-10-CM | POA: Diagnosis not present

## 2020-12-26 DIAGNOSIS — Z888 Allergy status to other drugs, medicaments and biological substances status: Secondary | ICD-10-CM | POA: Insufficient documentation

## 2020-12-26 DIAGNOSIS — I10 Essential (primary) hypertension: Secondary | ICD-10-CM | POA: Diagnosis not present

## 2020-12-26 DIAGNOSIS — K2100 Gastro-esophageal reflux disease with esophagitis, without bleeding: Secondary | ICD-10-CM | POA: Insufficient documentation

## 2020-12-26 DIAGNOSIS — E119 Type 2 diabetes mellitus without complications: Secondary | ICD-10-CM | POA: Diagnosis not present

## 2020-12-26 DIAGNOSIS — Z79899 Other long term (current) drug therapy: Secondary | ICD-10-CM | POA: Diagnosis not present

## 2020-12-26 DIAGNOSIS — K9289 Other specified diseases of the digestive system: Secondary | ICD-10-CM | POA: Diagnosis present

## 2020-12-26 DIAGNOSIS — E785 Hyperlipidemia, unspecified: Secondary | ICD-10-CM | POA: Diagnosis not present

## 2020-12-26 HISTORY — PX: ESOPHAGOGASTRODUODENOSCOPY (EGD) WITH PROPOFOL: SHX5813

## 2020-12-26 LAB — GLUCOSE, CAPILLARY: Glucose-Capillary: 118 mg/dL — ABNORMAL HIGH (ref 70–99)

## 2020-12-26 SURGERY — ESOPHAGOGASTRODUODENOSCOPY (EGD) WITH PROPOFOL
Anesthesia: General

## 2020-12-26 MED ORDER — PROPOFOL 10 MG/ML IV BOLUS
INTRAVENOUS | Status: DC | PRN
Start: 2020-12-26 — End: 2020-12-26
  Administered 2020-12-26: 70 mg via INTRAVENOUS
  Administered 2020-12-26: 30 mg via INTRAVENOUS

## 2020-12-26 MED ORDER — SODIUM CHLORIDE 0.9 % IV SOLN: INTRAVENOUS | Status: DC

## 2020-12-26 MED ORDER — LIDOCAINE HCL (CARDIAC) PF 100 MG/5ML IV SOSY
PREFILLED_SYRINGE | INTRAVENOUS | Status: DC | PRN
  Administered 2020-12-26: 100 mg via INTRAVENOUS

## 2020-12-26 NOTE — Anesthesia Preprocedure Evaluation (Signed)
Anesthesia Evaluation  Patient identified by MRN, date of birth, ID band Patient awake    Reviewed: Allergy & Precautions, H&P , NPO status , Patient's Chart, lab work & pertinent test results, reviewed documented beta blocker date and time   History of Anesthesia Complications Negative for: history of anesthetic complications  Airway Mallampati: I  TM Distance: >3 FB Neck ROM: full    Dental  (+) Dental Advidsory Given, Edentulous Upper, Edentulous Lower   Pulmonary neg shortness of breath, neg COPD, neg recent URI, Current Smoker and Patient abstained from smoking.,    Pulmonary exam normal breath sounds clear to auscultation       Cardiovascular Exercise Tolerance: Good hypertension, (-) angina(-) Past MI and (-) Cardiac Stents Normal cardiovascular exam+ dysrhythmias (palpitations) (-) Valvular Problems/Murmurs Rhythm:regular Rate:Normal     Neuro/Psych negative neurological ROS  negative psych ROS   GI/Hepatic Neg liver ROS, PUD, GERD  ,  Endo/Other  diabetes  Renal/GU negative Renal ROS  negative genitourinary   Musculoskeletal   Abdominal   Peds  Hematology negative hematology ROS (+)   Anesthesia Other Findings Past Medical History: No date: Arthritis No date: Cancer ([place])     Comment:  colon cancer 02/2019 per pt  No date: Diabetes mellitus without complication ([facility]) No date: H/O colon cancer, stage IV No date: Hyperlipemia No date: Hypertension   Reproductive/Obstetrics negative OB ROS                             Anesthesia Physical Anesthesia Plan  ASA: III  Anesthesia Plan: General   Post-op Pain Management:    Induction: Intravenous  PONV Risk Score and Plan: 1 and TIVA and Propofol infusion  Airway Management Planned: Natural Airway and Nasal Cannula  Additional Equipment:   Intra-op Plan:   Post-operative Plan:   Informed Consent: I have reviewed the  patients History and Physical, chart, labs and discussed the procedure including the risks, benefits and alternatives for the proposed anesthesia with the patient or authorized representative who has indicated his/her understanding and acceptance.     Dental Advisory Given  Plan Discussed with: Anesthesiologist, CRNA and Surgeon  Anesthesia Plan Comments:         Anesthesia Quick Evaluation

## 2020-12-26 NOTE — Transfer of Care (Signed)
Immediate Anesthesia Transfer of Care Note  Patient: Oscar [name]  Procedure(s) Performed: ESOPHAGOGASTRODUODENOSCOPY (EGD) WITH PROPOFOL (N/A )  Patient Location: PACU  Anesthesia Type:General  Level of Consciousness: sedated  Airway & Oxygen Therapy: Patient Spontanous Breathing  Post-op Assessment: Report given to RN and Post -op Vital signs reviewed and stable  Post vital signs: Reviewed and stable  Last Vitals:  Vitals Value Taken Time  BP 131/49 12/26/20 1131  Temp 36.5 C 12/26/20 1131  Pulse 70 12/26/20 1131  Resp 19 12/26/20 1131  SpO2 98 % 12/26/20 1131  Vitals shown include unvalidated device data.  Last Pain:  Vitals:   12/26/20 1014  TempSrc: Temporal  PainSc: 0-No pain         Complications: No complications documented.

## 2020-12-26 NOTE — Anesthesia Postprocedure Evaluation (Signed)
Anesthesia Post Note  Patient: Oscar [name]  Procedure(s) Performed: ESOPHAGOGASTRODUODENOSCOPY (EGD) WITH PROPOFOL (N/A )  Patient location during evaluation: Endoscopy Anesthesia Type: General Level of consciousness: awake and alert Pain management: pain level controlled Vital Signs Assessment: post-procedure vital signs reviewed and stable Respiratory status: spontaneous breathing, nonlabored ventilation, respiratory function stable and patient connected to nasal cannula oxygen Cardiovascular status: blood pressure returned to baseline and stable Postop Assessment: no apparent nausea or vomiting Anesthetic complications: no   No complications documented.   Last Vitals:  Vitals:   12/26/20 1151 12/26/20 1201  BP: (!) 161/84 (!) 154/77  Pulse: 80 80  Resp: 12 17  Temp:    SpO2: 97% 97%    Last Pain:  Vitals:   12/26/20 1201  TempSrc:   PainSc: 0-No pain                 Martha Clan

## 2020-12-26 NOTE — H&P (Signed)
Oscar Bellows, MD [address], [place], [place], [place], [address] [address], [place], [place], [place], [address] Phone: [phone]  Fax: [phone]  Primary Care Physician:  Sofie Hartigan, MD   Pre-Procedure History & Physical: HPI:  Oscar [name] is a 80 y.o. male is here for an endoscopy    Past Medical History:  Diagnosis Date  . Arthritis   . Cancer ([facility])    colon cancer 02/2019 per pt   . Diabetes mellitus without complication ([facility])   . H/O colon cancer, stage IV   . Hyperlipemia   . Hypertension     Past Surgical History:  Procedure Laterality Date  . IR ANGIOGRAM SELECTIVE EACH ADDITIONAL VESSEL  12/19/2020  . IR ANGIOGRAM VISCERAL SELECTIVE  12/19/2020  . IR EMBO TUMOR ORGAN ISCHEMIA INFARCT INC GUIDE ROADMAPPING  12/19/2020  . IR IMAGING GUIDED PORT INSERTION  07/20/2019  . IR RADIOLOGIST EVAL & MGMT  12/05/2020  . IR US GUIDE VASC ACCESS RIGHT  12/19/2020  . JOINT REPLACEMENT      Prior to Admission medications   Medication Sig Start Date End Date Taking? Authorizing Provider  meloxicam (MOBIC) 15 MG tablet Take 1 tablet (15 mg total) by mouth daily. 09/30/20  Yes Cammie Sickle, MD  moxifloxacin (AVELOX) 400 MG tablet Take one tablet by mouth once daily for 7 days. Patient taking differently: Take 400 mg by mouth daily. 12/19/20  Yes Louk, Alexandra M, PA-C  prochlorperazine (COMPAZINE) 10 MG tablet Take 1 tablet (10 mg total) by mouth every 6 (six) hours as needed for nausea or vomiting. 11/21/20  Yes Borders, Kirt Boys, NP  tamsulosin (FLOMAX) 0.4 MG CAPS capsule Take 1 capsule (0.4 mg total) by mouth daily. 03/15/20 03/15/21 Yes Borders, Kirt Boys, NP  nystatin (MYCOSTATIN) 100000 UNIT/ML suspension Take 5 mLs (500,000 Units total) by mouth 4 (four) times daily. Patient not taking: No sig reported 11/05/20   Cammie Sickle, MD  ondansetron (ZOFRAN ODT) 8 MG disintegrating tablet Take 1 tablet (8 mg total) by mouth every 8 (eight) hours as  needed for nausea or vomiting. Patient not taking: No sig reported 11/19/20   Cammie Sickle, MD  ondansetron (ZOFRAN) 8 MG tablet Take 1 tablet (8 mg total) by mouth every 8 (eight) hours as needed for nausea or vomiting. Patient not taking: Reported on 12/26/2020 12/19/20   Ronney Lion, PA-C  oxyCODONE (OXY IR/ROXICODONE) 5 MG immediate release tablet Take 1 tablet (5 mg total) by mouth every 6 (six) hours as needed for severe pain. Patient not taking: Reported on 12/26/2020 11/21/20   Borders, Kirt Boys, NP  oxyCODONE (OXY IR/ROXICODONE) 5 MG immediate release tablet Take 1 tablet (5 mg total) by mouth every 6 (six) hours as needed for moderate pain or severe pain. Patient not taking: Reported on 12/26/2020 12/19/20   Ronney Lion, PA-C  pantoprazole (PROTONIX) 40 MG tablet Take 1 tablet (40 mg total) by mouth 2 (two) times daily. Patient not taking: No sig reported 11/27/20   Cammie Sickle, MD  pravastatin (PRAVACHOL) 40 MG tablet Take 40 mg by mouth daily.     [provider]  sucralfate (CARAFATE) 1 g tablet Take 1 tablet (1 g total) by mouth 4 (four) times daily -  with meals and at bedtime. Patient not taking: Reported on 12/26/2020 11/27/20   Cammie Sickle, MD    Allergies as of 12/04/2020 - Review Complete 12/04/2020  Allergen Reaction  Noted  . Ace inhibitors Swelling 07/20/2014    Family History  Problem Relation Age of Onset  . Peptic Ulcer Disease Father     Social History   Socioeconomic History  . Marital status: Married    Spouse name: Not on file  . Number of children: Not on file  . Years of education: Not on file  . Highest education level: Not on file  Occupational History  . Not on file  Tobacco Use  . Smoking status: Current Every Day Smoker    Packs/day: 0.25    Types: Cigarettes  . Smokeless tobacco: Never Used  . Tobacco comment: 1 to 2 cigarettes a day occasionally  Vaping Use  . Vaping Use: Never used  Substance and  Sexual Activity  . Alcohol use: Yes    Comment: rarely   . Drug use: No  . Sexual activity: Not on file  Other Topics Concern  . Not on file  Social History Narrative   Recruitment consultant retd; lives in [place]; smoking 3cig/day; [3/4 ppd x started at 9 years]; no alcohol. Son & daughter; wife dementia [waiting for placement].    Social Determinants of Health   Financial Resource Strain: Not on file  Food Insecurity: Not on file  Transportation Needs: Not on file  Physical Activity: Not on file  Stress: Not on file  Social Connections: Not on file  Intimate Partner Violence: Not on file    Review of Systems: See HPI, otherwise negative ROS  Physical Exam: BP (!) 145/77   Pulse 70   Temp 98.4 F (36.9 C) (Temporal)   Resp 20   Ht 6' (1.829 m)   Wt 81.6 kg   SpO2 100%   BMI 24.41 kg/m  General:   Alert,  pleasant and cooperative in NAD Head:  Normocephalic and atraumatic. Neck:  Supple; no masses or thyromegaly. Lungs:  Clear throughout to auscultation, normal respiratory effort.    Heart:  +S1, +S2, Regular rate and rhythm, No edema. Abdomen:  Soft, nontender and nondistended. Normal bowel sounds, without guarding, and without rebound.   Neurologic:  Alert and  oriented x4;  grossly normal neurologically.  Impression/Plan: Oscar [name] is here for an endoscopy  to be performed for  evaluation of duodenal ulcer     Risks, benefits, limitations, and alternatives regarding endoscopy have been reviewed with the patient.  Questions have been answered.  All parties agreeable.   Oscar Bellows, MD  12/26/2020, 11:17 AM

## 2020-12-26 NOTE — H&P (View-Only) (Signed)
Oscar Bellows, MD [address] [place], [place], [place], [address] [address], [place], [place], [place], [address] Phone: [phone]  Fax: [phone]  Primary Care Physician:  Sofie Hartigan, MD   Pre-Procedure History & Physical: HPI:  Oscar [name] is a 80 y.o. male is here for an endoscopy    Past Medical History:  Diagnosis Date  . Arthritis   . Cancer ([facility])    colon cancer 02/2019 per pt   . Diabetes mellitus without complication ([facility])   . H/O colon cancer, stage IV   . Hyperlipemia   . Hypertension     Past Surgical History:  Procedure Laterality Date  . IR ANGIOGRAM SELECTIVE EACH ADDITIONAL VESSEL  12/19/2020  . IR ANGIOGRAM VISCERAL SELECTIVE  12/19/2020  . IR EMBO TUMOR ORGAN ISCHEMIA INFARCT INC GUIDE ROADMAPPING  12/19/2020  . IR IMAGING GUIDED PORT INSERTION  07/20/2019  . IR RADIOLOGIST EVAL & MGMT  12/05/2020  . IR US GUIDE VASC ACCESS RIGHT  12/19/2020  . JOINT REPLACEMENT      Prior to Admission medications   Medication Sig Start Date End Date Taking? Authorizing Provider  meloxicam (MOBIC) 15 MG tablet Take 1 tablet (15 mg total) by mouth daily. 09/30/20  Yes Cammie Sickle, MD  moxifloxacin (AVELOX) 400 MG tablet Take one tablet by mouth once daily for 7 days. Patient taking differently: Take 400 mg by mouth daily. 12/19/20  Yes Louk, Alexandra M, PA-C  prochlorperazine (COMPAZINE) 10 MG tablet Take 1 tablet (10 mg total) by mouth every 6 (six) hours as needed for nausea or vomiting. 11/21/20  Yes Borders, Kirt Boys, NP  tamsulosin (FLOMAX) 0.4 MG CAPS capsule Take 1 capsule (0.4 mg total) by mouth daily. 03/15/20 03/15/21 Yes Borders, Kirt Boys, NP  nystatin (MYCOSTATIN) 100000 UNIT/ML suspension Take 5 mLs (500,000 Units total) by mouth 4 (four) times daily. Patient not taking: No sig reported 11/05/20   Cammie Sickle, MD  ondansetron (ZOFRAN ODT) 8 MG disintegrating tablet Take 1 tablet (8 mg total) by mouth every 8 (eight) hours as  needed for nausea or vomiting. Patient not taking: No sig reported 11/19/20   Cammie Sickle, MD  ondansetron (ZOFRAN) 8 MG tablet Take 1 tablet (8 mg total) by mouth every 8 (eight) hours as needed for nausea or vomiting. Patient not taking: Reported on 12/26/2020 12/19/20   Ronney Lion, PA-C  oxyCODONE (OXY IR/ROXICODONE) 5 MG immediate release tablet Take 1 tablet (5 mg total) by mouth every 6 (six) hours as needed for severe pain. Patient not taking: Reported on 12/26/2020 11/21/20   Borders, Kirt Boys, NP  oxyCODONE (OXY IR/ROXICODONE) 5 MG immediate release tablet Take 1 tablet (5 mg total) by mouth every 6 (six) hours as needed for moderate pain or severe pain. Patient not taking: Reported on 12/26/2020 12/19/20   Ronney Lion, PA-C  pantoprazole (PROTONIX) 40 MG tablet Take 1 tablet (40 mg total) by mouth 2 (two) times daily. Patient not taking: No sig reported 11/27/20   Cammie Sickle, MD  pravastatin (PRAVACHOL) 40 MG tablet Take 40 mg by mouth daily.     [provider]  sucralfate (CARAFATE) 1 g tablet Take 1 tablet (1 g total) by mouth 4 (four) times daily -  with meals and at bedtime. Patient not taking: Reported on 12/26/2020 11/27/20   Cammie Sickle, MD    Allergies as of 12/04/2020 - Review Complete 12/04/2020  Allergen Reaction  Noted  . Ace inhibitors Swelling 07/20/2014    Family History  Problem Relation Age of Onset  . Peptic Ulcer Disease Father     Social History   Socioeconomic History  . Marital status: Married    Spouse name: Not on file  . Number of children: Not on file  . Years of education: Not on file  . Highest education level: Not on file  Occupational History  . Not on file  Tobacco Use  . Smoking status: Current Every Day Smoker    Packs/day: 0.25    Types: Cigarettes  . Smokeless tobacco: Never Used  . Tobacco comment: 1 to 2 cigarettes a day occasionally  Vaping Use  . Vaping Use: Never used  Substance and  Sexual Activity  . Alcohol use: Yes    Comment: rarely   . Drug use: No  . Sexual activity: Not on file  Other Topics Concern  . Not on file  Social History Narrative   Recruitment consultant retd; lives in [place]; smoking 3cig/day; [3/4 ppd x started at 9 years]; no alcohol. Son & daughter; wife dementia [waiting for placement].    Social Determinants of Health   Financial Resource Strain: Not on file  Food Insecurity: Not on file  Transportation Needs: Not on file  Physical Activity: Not on file  Stress: Not on file  Social Connections: Not on file  Intimate Partner Violence: Not on file    Review of Systems: See HPI, otherwise negative ROS  Physical Exam: BP (!) 145/77   Pulse 70   Temp 98.4 F (36.9 C) (Temporal)   Resp 20   Ht 6' (1.829 m)   Wt 81.6 kg   SpO2 100%   BMI 24.41 kg/m  General:   Alert,  pleasant and cooperative in NAD Head:  Normocephalic and atraumatic. Neck:  Supple; no masses or thyromegaly. Lungs:  Clear throughout to auscultation, normal respiratory effort.    Heart:  +S1, +S2, Regular rate and rhythm, No edema. Abdomen:  Soft, nontender and nondistended. Normal bowel sounds, without guarding, and without rebound.   Neurologic:  Alert and  oriented x4;  grossly normal neurologically.  Impression/Plan: Oscar [name] is here for an endoscopy  to be performed for  evaluation of duodenal ulcer     Risks, benefits, limitations, and alternatives regarding endoscopy have been reviewed with the patient.  Questions have been answered.  All parties agreeable.   Oscar Bellows, MD  12/26/2020, 11:17 AM

## 2020-12-26 NOTE — Op Note (Signed)
[facility] Patient Name: Oscar [name] Procedure Date: 12/26/2020 11:20 AM MRN: [id number] Account #: [id number] Date of Birth: 1941/09/20 Admit Type: Outpatient Age: 80 Room: [facility] ENDO ROOM 3 Gender: Male Note Status: Finalized Procedure:             Upper GI endoscopy Indications:           Suspected duodenal ulcer Providers:             [name] MD, MD Referring MD:          Sofie Hartigan (Referring MD) Medicines:             Monitored Anesthesia Care Complications:         No immediate complications. Procedure:             Pre-Anesthesia Assessment:                        - Prior to the procedure, a History and Physical was                         performed, and patient medications, allergies and                         sensitivities were reviewed. The patient's tolerance                         of previous anesthesia was reviewed.                        - The risks and benefits of the procedure and the                         sedation options and risks were discussed with the                         patient. All questions were answered and informed                         consent was obtained.                        - ASA Grade Assessment: III - A patient with severe                         systemic disease.                        After obtaining informed consent, the endoscope was                         passed under direct vision. Throughout the procedure,                         the patient's blood pressure, pulse, and oxygen                         saturations were monitored continuously. The Endoscope                         was introduced through the mouth, and  advanced to the                         third part of duodenum. The upper GI endoscopy was                         accomplished with ease. The patient tolerated the                         procedure well. Findings:      LA Grade A (one or more mucosal breaks less than  5 mm, not extending       between tops of 2 mucosal folds) esophagitis with no bleeding was found       in the lower third of the esophagus.      The stomach was normal.      The cardia and gastric fundus were normal on retroflexion.      One non-bleeding cratered duodenal ulcer with a clean ulcer base       (Forrest Class III) was found in the second portion of the duodenum. The       lesion was 15 mm in largest dimension. Impression:            - LA Grade A reflux esophagitis with no bleeding.                        - Normal stomach.                        - Non-bleeding duodenal ulcer with a clean ulcer base                         (Forrest Class III).                        - No specimens collected. Recommendation:        - Discharge patient to home (with escort).                        - Resume previous diet.                        - Continue present medications.                        - Stop all NSAID's, he told me today he has been                         taking Meloxicam daily for many years- advised him to                         stop                        Repet EGD in 8-10 weeks to ensurte healing of ulcer                        Ciontinue high dose PPI Procedure Code(s):     --- Professional ---  63875, Esophagogastroduodenoscopy, flexible,                         transoral; diagnostic, including collection of                         specimen(s) by brushing or washing, when performed                         (separate procedure) Diagnosis Code(s):     --- Professional ---                        K21.00, Gastro-esophageal reflux disease with                         esophagitis, without bleeding                        K26.9, Duodenal ulcer, unspecified as acute or                         chronic, without hemorrhage or perforation CPT copyright 2019 [employer]. All rights reserved. The codes documented in this report are preliminary and  upon coder review may  be revised to meet current compliance requirements. [name], MD [name] MD, MD 12/26/2020 11:30:56 AM This report has been signed electronically. Number of Addenda: 0 Note Initiated On: 12/26/2020 11:20 AM Estimated Blood Loss:  Estimated blood loss: none.      [facility]

## 2020-12-27 ENCOUNTER — Encounter: Payer: Self-pay | Admitting: Gastroenterology

## 2020-12-31 ENCOUNTER — Encounter: Payer: Self-pay | Admitting: Internal Medicine

## 2020-12-31 ENCOUNTER — Inpatient Hospital Stay (HOSPITAL_BASED_OUTPATIENT_CLINIC_OR_DEPARTMENT_OTHER): Payer: Medicare HMO | Admitting: Internal Medicine

## 2020-12-31 ENCOUNTER — Inpatient Hospital Stay: Payer: Medicare HMO | Attending: Internal Medicine

## 2020-12-31 DIAGNOSIS — G620 Drug-induced polyneuropathy: Secondary | ICD-10-CM | POA: Insufficient documentation

## 2020-12-31 DIAGNOSIS — F17210 Nicotine dependence, cigarettes, uncomplicated: Secondary | ICD-10-CM | POA: Insufficient documentation

## 2020-12-31 DIAGNOSIS — C182 Malignant neoplasm of ascending colon: Secondary | ICD-10-CM | POA: Insufficient documentation

## 2020-12-31 DIAGNOSIS — C787 Secondary malignant neoplasm of liver and intrahepatic bile duct: Secondary | ICD-10-CM | POA: Insufficient documentation

## 2020-12-31 DIAGNOSIS — K269 Duodenal ulcer, unspecified as acute or chronic, without hemorrhage or perforation: Secondary | ICD-10-CM | POA: Insufficient documentation

## 2020-12-31 LAB — URINALYSIS, COMPLETE (UACMP) WITH MICROSCOPIC
Bilirubin Urine: NEGATIVE
Glucose, UA: NEGATIVE mg/dL
Hgb urine dipstick: NEGATIVE
Ketones, ur: NEGATIVE mg/dL
Leukocytes,Ua: NEGATIVE
Nitrite: NEGATIVE
Protein, ur: NEGATIVE mg/dL
Specific Gravity, Urine: 1.023 (ref 1.005–1.030)
pH: 5 (ref 5.0–8.0)

## 2020-12-31 LAB — CBC WITH DIFFERENTIAL/PLATELET
Abs Immature Granulocytes: 0.02 10*3/uL (ref 0.00–0.07)
Basophils Absolute: 0 10*3/uL (ref 0.0–0.1)
Basophils Relative: 1 %
Eosinophils Absolute: 0.3 10*3/uL (ref 0.0–0.5)
Eosinophils Relative: 7 %
HCT: 34.2 % — ABNORMAL LOW (ref 39.0–52.0)
Hemoglobin: 11.4 g/dL — ABNORMAL LOW (ref 13.0–17.0)
Immature Granulocytes: 0 %
Lymphocytes Relative: 24 %
Lymphs Abs: 1.2 10*3/uL (ref 0.7–4.0)
MCH: 33.3 pg (ref 26.0–34.0)
MCHC: 33.3 g/dL (ref 30.0–36.0)
MCV: 100 fL (ref 80.0–100.0)
Monocytes Absolute: 0.5 10*3/uL (ref 0.1–1.0)
Monocytes Relative: 10 %
Neutro Abs: 2.9 10*3/uL (ref 1.7–7.7)
Neutrophils Relative %: 58 %
Platelets: 183 10*3/uL (ref 150–400)
RBC: 3.42 MIL/uL — ABNORMAL LOW (ref 4.22–5.81)
RDW: 13.9 % (ref 11.5–15.5)
WBC: 4.9 10*3/uL (ref 4.0–10.5)
nRBC: 0 % (ref 0.0–0.2)

## 2020-12-31 LAB — COMPREHENSIVE METABOLIC PANEL WITH GFR
ALT: 17 U/L (ref 0–44)
AST: 28 U/L (ref 15–41)
Albumin: 2.7 g/dL — ABNORMAL LOW (ref 3.5–5.0)
Alkaline Phosphatase: 118 U/L (ref 38–126)
Anion gap: 7 (ref 5–15)
BUN: 8 mg/dL (ref 8–23)
CO2: 26 mmol/L (ref 22–32)
Calcium: 8.1 mg/dL — ABNORMAL LOW (ref 8.9–10.3)
Chloride: 106 mmol/L (ref 98–111)
Creatinine, Ser: 0.77 mg/dL (ref 0.61–1.24)
GFR, Estimated: >60 mL/min
Glucose, Bld: 168 mg/dL — ABNORMAL HIGH (ref 70–99)
Potassium: 3.6 mmol/L (ref 3.5–5.1)
Sodium: 139 mmol/L (ref 135–145)
Total Bilirubin: 0.8 mg/dL (ref 0.3–1.2)
Total Protein: 6.6 g/dL (ref 6.5–8.1)

## 2020-12-31 MED ORDER — HEPARIN SOD (PORK) LOCK FLUSH 100 UNIT/ML IV SOLN
500.0000 [IU] | Freq: Once | INTRAVENOUS | Status: AC
  Administered 2020-12-31: 500 [IU] via INTRAVENOUS
  Filled 2020-12-31: qty 5

## 2020-12-31 MED ORDER — HEPARIN SOD (PORK) LOCK FLUSH 100 UNIT/ML IV SOLN
INTRAVENOUS | Status: AC
  Filled 2020-12-31: qty 5

## 2020-12-31 MED ORDER — SODIUM CHLORIDE 0.9% FLUSH
10.0000 mL | Freq: Once | INTRAVENOUS | Status: AC
  Administered 2020-12-31: 10 mL via INTRAVENOUS
  Filled 2020-12-31: qty 10

## 2020-12-31 NOTE — Assessment & Plan Note (Addendum)
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 Currently on FOLFIRI plus Avastinon HOLD; MRI FEB 2022- over stable liver disease disease; but peptic ulcer [see below]  # HOLD further chemo [sec to poor tolerance]; currently s/p liver ablation on 3/10; awaiting repeat on 3/30.  Reviewed the significance of elevated CEA; will recommend repeating a scan in approximately 6 weeks post ablation.  # Peptic ulcer disease/duodenal ulcer-NSAID related.  S/p EGD on 2/16-continue PPI.  # PN- G-1-2; sec to oxaliplatin-STABLE.   # Mediport- port flush q 2-3 months  # DISPOSITION:  # follow up in 4 weeks- MD; labs- cbc/cmp CEA/port flush- Dr.B

## 2020-12-31 NOTE — Progress Notes (Signed)
[facility] NOTE  Patient Care Team: Sofie Hartigan, MD as PCP - General (Family Medicine) Cammie Sickle, MD as Consulting Physician (Hematology and Oncology)  CHIEF COMPLAINTS/PURPOSE OF CONSULTATION: Colon cancer  #  Oncology History Overview Note  # MAY 2020- 3. 03/09/19 Liver biopsy. Microscopic examination shows malignant cells with glandular architecture consistent with adenocarcinoma. The malignant cells are positive for CK20 and CDX-2. These findings support the clinical impression of metastatic colon adenocarcinoma. 4. 03/10/19 R hemicolectomy. Tumor site cecum. Adenocarcinoma. Mucinous features present. G2. No tumor deposits. Invades visceral peritoneum. No tumor perforation. LVI present. PNI not identified. All margins uninvolved. 1/12 LNs. PT4apN1. Periappendiceal inflammation c/w resolving abscess. Microsatellite stable (MSS). [Dr.Mettu; [facility]]  # SEP 4th 2020 [compared to May 2020]  Interval increase in size of the metastases to the hepatic dome, The metastasis to the left hepatic lobe is unchanged; 2.  New subcentimeter hypoattenuating lesion in the inferior right hepatic lobe, incompletely characterized on CT. 3.  Postsurgical changes following right hemicolectomy.  # OCT 2020- FOLFOX +avastin; CT dec 22nd 2020- [compared to [facility] sep 9th 2020]-Liver- slight progression versus stable disease; CT scan SEP 4th 2021- Progressive disease-left lower lobe lung nodule 8 mm [previously 4 mm]; increase in size of the hepatic metastases by few millimeters.;  Soft tissue nodule adjacent to anastomotic site again increased by few millimeters.STOP FOLFOX; cont avastin  #  SEP 20th, 2021- FOLFIRI+ AVASTIN; HOLD FEB 2022- sec to poor toelrance [peptic ulcer]; 3/30-liver ablation  # FEB /16-EGD [Dr.Anna-duodenal ulcer-? meloxicam]  # JAN 2022- COVID [s/p Mab infusion; pills; skin rash resolved.]  # NGS/F-ONE-MUTATED K-RAS [G]  # PALLIATIVE CARE EVALUATION:  09/20/2019-Oscar [name]  # PAIN MANAGEMENT: NA   DIAGNOSIS: COLON CANCER  STAGE:  IV     ;  GOALS:Palliative  CURRENT/MOST RECENT THERAPY : FOLFIRI+ avastin [C]    Cancer of right colon ([place])  07/05/2019 Initial Diagnosis   Cancer of right colon ([place])   07/24/2019 - 06/25/2020 Chemotherapy   The patient had dexamethasone (DECADRON) 4 MG tablet, 8 mg, Oral, Daily, 1 of 1 cycle, Start date: --, End date: -- palonosetron (ALOXI) injection 0.25 mg, 0.25 mg, Intravenous,  Once, 23 of 25 cycles Administration: 0.25 mg (07/24/2019), 0.25 mg (08/07/2019), 0.25 mg (08/21/2019), 0.25 mg (09/04/2019), 0.25 mg (09/20/2019), 0.25 mg (10/04/2019), 0.25 mg (10/16/2019), 0.25 mg (10/30/2019), 0.25 mg (11/22/2019), 0.25 mg (12/06/2019), 0.25 mg (12/20/2019), 0.25 mg (01/03/2020), 0.25 mg (01/29/2020), 0.25 mg (02/12/2020), 0.25 mg (02/26/2020), 0.25 mg (03/11/2020), 0.25 mg (03/25/2020), 0.25 mg (04/08/2020), 0.25 mg (04/24/2020), 0.25 mg (05/08/2020), 0.25 mg (05/22/2020), 0.25 mg (06/10/2020), 0.25 mg (06/25/2020) leucovorin 800 mg in dextrose 5 % 250 mL infusion, 844 mg, Intravenous,  Once, 23 of 25 cycles Administration: 800 mg (07/24/2019), 800 mg (08/07/2019), 800 mg (08/21/2019), 800 mg (09/04/2019), 800 mg (09/20/2019), 800 mg (10/04/2019), 800 mg (10/16/2019), 800 mg (10/30/2019), 800 mg (11/22/2019), 800 mg (12/06/2019), 800 mg (12/20/2019), 800 mg (01/03/2020), 800 mg (01/29/2020), 800 mg (02/12/2020), 800 mg (02/26/2020), 800 mg (03/11/2020), 800 mg (03/25/2020), 800 mg (04/08/2020), 800 mg (04/24/2020), 800 mg (05/08/2020), 800 mg (05/22/2020), 800 mg (06/10/2020), 800 mg (06/25/2020) oxaliplatin (ELOXATIN) 180 mg in dextrose 5 % 500 mL chemo infusion, 85 mg/m2 = 180 mg, Intravenous,  Once, 23 of 25 cycles Dose modification: 178 mg (original dose 85 mg/m2, Cycle 17, Reason: Other (see comments), Comment: insurance adjusted dose ) Administration: 180 mg (07/24/2019), 180 mg (08/07/2019), 180 mg (08/21/2019), 180 mg (09/04/2019), 180 mg (09/20/2019),  180 mg  (10/04/2019), 180 mg (10/16/2019), 180 mg (10/30/2019), 180 mg (11/22/2019), 180 mg (12/06/2019), 180 mg (12/20/2019), 180 mg (01/03/2020), 180 mg (01/29/2020), 180 mg (02/12/2020), 180 mg (02/26/2020), 180 mg (03/11/2020), 180 mg (04/08/2020), 180 mg (04/24/2020), 180 mg (05/08/2020), 180 mg (05/22/2020), 180 mg (06/10/2020), 180 mg (06/25/2020) fluorouracil (ADRUCIL) 5,000 mg in sodium chloride 0.9 % 150 mL chemo infusion, 5,050 mg, Intravenous, 1 Day/Dose, 23 of 25 cycles Administration: 5,000 mg (07/24/2019), 5,000 mg (08/07/2019), 5,000 mg (08/21/2019), 5,050 mg (09/04/2019), 5,000 mg (10/04/2019), 5,000 mg (10/16/2019), 5,000 mg (11/22/2019), 5,000 mg (12/06/2019), 5,000 mg (12/20/2019), 5,000 mg (01/03/2020), 5,000 mg (01/29/2020), 5,000 mg (02/12/2020), 5,000 mg (02/26/2020), 5,000 mg (03/11/2020), 5,000 mg (03/25/2020), 5,000 mg (04/08/2020), 5,000 mg (04/24/2020), 5,000 mg (05/08/2020), 5,000 mg (05/22/2020), 5,000 mg (06/10/2020), 5,000 mg (06/25/2020) bevacizumab-bvzr (ZIRABEV) 400 mg in sodium chloride 0.9 % 100 mL chemo infusion, 5 mg/kg = 400 mg, Intravenous,  Once, 23 of 25 cycles Administration: 400 mg (08/07/2019), 400 mg (08/21/2019), 400 mg (09/04/2019), 400 mg (09/20/2019), 400 mg (10/04/2019), 400 mg (10/16/2019), 400 mg (10/30/2019), 400 mg (11/22/2019), 400 mg (12/06/2019), 400 mg (12/20/2019), 400 mg (01/03/2020), 400 mg (01/29/2020), 400 mg (02/12/2020), 400 mg (02/26/2020), 400 mg (03/11/2020), 400 mg (03/25/2020), 400 mg (04/08/2020), 400 mg (04/24/2020), 400 mg (05/08/2020), 400 mg (05/22/2020), 400 mg (06/10/2020), 400 mg (06/25/2020)  for chemotherapy treatment.    07/08/2020 -  Chemotherapy    Patient is on Treatment Plan: COLORECTAL FOLFIRI / BEVACIZUMAB Q14D      11/05/2020 Cancer Staging   Staging form: Colon and Rectum, AJCC 8th Edition - Clinical: Stage IVC (pM1c) - Signed by Cammie Sickle, MD on 11/05/2020     HISTORY OF PRESENTING ILLNESS:  Oscar [name] 80 y.o.  male with metastatic colon cancer to the liver  currently on FOLFIRI plus Avastin chemotherapy-is here for follow-up.  Patient's chemotherapy is on hold-because of poor tolerance.  In the interim patient has been evaluated by IR-currently status post microwave ablation x1 last week.  Again awaiting repeat microwave ablation o interim also patient underwentn 3/30.  In the interim again endoscopy noted to have a duodenal ulcer-continues to be on PPI.  Otherwise no nausea no vomiting.  Review of Systems  Constitutional: Positive for malaise/fatigue. Negative for chills, diaphoresis and fever.  HENT: Negative for nosebleeds and sore throat.   Eyes: Negative for double vision.  Respiratory: Negative for cough, hemoptysis, sputum production, shortness of breath and wheezing.   Cardiovascular: Negative for chest pain, palpitations, orthopnea and leg swelling.  Gastrointestinal: Negative for blood in stool, constipation, heartburn, melena, nausea and vomiting.  Genitourinary: Negative for dysuria, frequency and urgency.  Musculoskeletal: Positive for back pain and joint pain.  Skin: Negative.  Negative for itching and rash.  Neurological: Positive for tingling. Negative for focal weakness, weakness and headaches.  Endo/Heme/Allergies: Does not bruise/bleed easily.  Psychiatric/Behavioral: Negative for depression. The patient is not nervous/anxious and does not have insomnia.      MEDICAL HISTORY:  Past Medical History:  Diagnosis Date  . Arthritis   . Cancer ([place])    colon cancer 02/2019 per pt   . Diabetes mellitus without complication ([place])   . H/O colon cancer, stage IV   . Hyperlipemia   . Hypertension     SURGICAL HISTORY: Past Surgical History:  Procedure Laterality Date  . ESOPHAGOGASTRODUODENOSCOPY (EGD) WITH PROPOFOL N/A 12/26/2020   Procedure: ESOPHAGOGASTRODUODENOSCOPY (EGD) WITH PROPOFOL;  Surgeon: [name], MD;  Location[facility] ENDOSCOPY;  Service: Gastroenterology;  Laterality: N/A;  . IR ANGIOGRAM SELECTIVE EACH  ADDITIONAL VESSEL  12/19/2020  . IR ANGIOGRAM VISCERAL SELECTIVE  12/19/2020  . IR EMBO TUMOR ORGAN ISCHEMIA INFARCT INC GUIDE ROADMAPPING  12/19/2020  . IR IMAGING GUIDED PORT INSERTION  07/20/2019  . IR RADIOLOGIST EVAL & MGMT  12/05/2020  . IR US GUIDE VASC ACCESS RIGHT  12/19/2020  . JOINT REPLACEMENT      SOCIAL HISTORY: Social History   Socioeconomic History  . Marital status: Married    Spouse name: Not on file  . Number of children: Not on file  . Years of education: Not on file  . Highest education level: Not on file  Occupational History  . Not on file  Tobacco Use  . Smoking status: Current Every Day Smoker    Packs/day: 0.25    Types: Cigarettes  . Smokeless tobacco: Never Used  . Tobacco comment: 1 to 2 cigarettes a day occasionally  Vaping Use  . Vaping Use: Never used  Substance and Sexual Activity  . Alcohol use: Yes    Comment: rarely   . Drug use: No  . Sexual activity: Not on file  Other Topics Concern  . Not on file  Social History Narrative   Recruitment consultant retd; lives in [place]; smoking 3cig/day; [3/4 ppd x started at 9 years]; no alcohol. Son & daughter; wife dementia [waiting for placement].    Social Determinants of Health   Financial Resource Strain: Not on file  Food Insecurity: Not on file  Transportation Needs: Not on file  Physical Activity: Not on file  Stress: Not on file  Social Connections: Not on file  Intimate Partner Violence: Not on file    FAMILY HISTORY: Family History  Problem Relation Age of Onset  . Peptic Ulcer Disease Father     ALLERGIES:  is allergic to ace inhibitors.  MEDICATIONS:  Current Outpatient Medications  Medication Sig Dispense Refill  . nystatin (MYCOSTATIN) 100000 UNIT/ML suspension Take 5 mLs (500,000 Units total) by mouth 4 (four) times daily. 60 mL 0  . ondansetron (ZOFRAN ODT) 8 MG disintegrating tablet Take 1 tablet (8 mg total) by mouth every 8 (eight) hours as needed for nausea or  vomiting. 45 tablet 0  . ondansetron (ZOFRAN) 8 MG tablet Take 1 tablet (8 mg total) by mouth every 8 (eight) hours as needed for nausea or vomiting. 20 tablet 0  . oxyCODONE (OXY IR/ROXICODONE) 5 MG immediate release tablet Take 1 tablet (5 mg total) by mouth every 6 (six) hours as needed for severe pain. 60 tablet 0  . pantoprazole (PROTONIX) 40 MG tablet Take 1 tablet (40 mg total) by mouth 2 (two) times daily. 60 tablet 3  . pravastatin (PRAVACHOL) 40 MG tablet Take 40 mg by mouth daily.     . prochlorperazine (COMPAZINE) 10 MG tablet Take 1 tablet (10 mg total) by mouth every 6 (six) hours as needed for nausea or vomiting. 60 tablet 0  . sucralfate (CARAFATE) 1 g tablet Take 1 tablet (1 g total) by mouth 4 (four) times daily -  with meals and at bedtime. 90 tablet 3  . tamsulosin (FLOMAX) 0.4 MG CAPS capsule Take 1 capsule (0.4 mg total) by mouth daily. 30 capsule 11  . meloxicam (MOBIC) 15 MG tablet Take 1 tablet (15 mg total) by mouth daily. (Patient not taking: Reported on 12/31/2020) 90 tablet 0   No current facility-administered medications for this visit.      Marland Kitchen  PHYSICAL EXAMINATION:  ECOG PERFORMANCE STATUS: 0 - Asymptomatic  Vitals:   12/31/20 1000  BP: 133/65  Pulse: 66  Resp: 16  Temp: 98.5 F (36.9 C)  SpO2: 100%   Filed Weights   12/31/20 1000  Weight: 182 lb 3.2 oz (82.6 kg)    Physical Exam HENT:     Head: Normocephalic and atraumatic.     Mouth/Throat:     Pharynx: No oropharyngeal exudate.     Comments: Whitish patches noted right side.  Eyes:     Pupils: Pupils are equal, round, and reactive to light.  Cardiovascular:     Rate and Rhythm: Normal rate and regular rhythm.  Pulmonary:     Effort: No respiratory distress.     Breath sounds: No wheezing.     Comments: Decreased breath sounds bilaterally.  No wheeze or crackles Abdominal:     General: Bowel sounds are normal. There is no distension.     Palpations: Abdomen is soft. There is no mass.      Tenderness: There is no abdominal tenderness. There is no guarding or rebound.  Musculoskeletal:        General: No tenderness. Normal range of motion.     Cervical back: Normal range of motion and neck supple.  Skin:    General: Skin is warm.  Neurological:     Mental Status: He is alert and oriented to person, place, and time.  Psychiatric:        Mood and Affect: Affect normal.    LABORATORY DATA:  I have reviewed the data as listed Lab Results  Component Value Date   WBC 4.9 12/31/2020   HGB 11.4 (L) 12/31/2020   HCT 34.2 (L) 12/31/2020   MCV 100.0 12/31/2020   PLT 183 12/31/2020   Recent Labs    06/25/20 0841 07/08/20 0843 07/22/20 0836 08/05/20 0821 12/03/20 0859 12/19/20 0954 12/31/20 0928  NA 138 139 138   < > 141 140 139  K 3.6 3.6 3.6   < > 3.8 3.9 3.6  CL 108 108 107   < > 109 107 106  CO2 $Re'24 25 25   'oGK$ < > $R'25 24 26  'pU$ GLUCOSE 201* 209* 195*   < > 155* 122* 168*  BUN $Re'10 9 10   'bOL$ < > $R'8 9 8  'wJ$ CREATININE 0.80 0.70 0.89   < > 0.81 0.69 0.77  CALCIUM 8.0* 8.0* 8.1*   < > 8.0* 8.5* 8.1*  GFRNONAA >60 >60 >60   < > >60 >60 >60  GFRAA >60 >60 >60  --   --   --   --   PROT 6.5 6.5 6.7   < > 6.1* 6.9 6.6  ALBUMIN 3.2* 3.1* 3.1*   < > 2.3* 3.0* 2.7*  AST 32 31 29   < > 35 34 28  ALT $Re'20 19 19   'vzn$ < > $R'19 20 17  'Gl$ ALKPHOS 121 102 108   < > 109 118 118  BILITOT 0.8 0.7 0.8   < > 0.8 1.2 0.8   < > = values in this interval not displayed.    RADIOGRAPHIC STUDIES: I have personally reviewed the radiological images as listed and agreed with the findings in the report. IR Angiogram Visceral Selective  Result Date: 12/19/2020 INDICATION: 80 year old male with right-sided colon cancer metastatic to the liver. He presents for planned hepatic arteriogram and bland embolization of the lesion in the left hepatic lobe as part of the stage therapy prior to microwave embolization.  EXAM: IR ULTRASOUND GUIDANCE VASC ACCESS RIGHT; IR EMBO TUMOR ORGAN ISCHEMIA INFARCT INC GUIDE ROADMAPPING;  ADDITIONAL ARTERIOGRAPHY; SELECTIVE VISCERAL ARTERIOGRAPHY MEDICATIONS: 2 g cefoxitin. The antibiotic was administered within 1 hour of the procedure ANESTHESIA/SEDATION: Moderate (conscious) sedation was employed during this procedure. A total of Versed 2 mg and Fentanyl 100 mcg was administered intravenously. Moderate Sedation Time: 39 minutes. The patient's level of consciousness and vital signs were monitored continuously by radiology nursing throughout the procedure under my direct supervision. CONTRAST:  19mL OMNIPAQUE IOHEXOL 300 MG/ML SOLN, 70mL OMNIPAQUE IOHEXOL 300 MG/ML SOLN FLUOROSCOPY TIME:  Fluoroscopy Time: 5 minutes 18 seconds (763 mGy). COMPLICATIONS: None immediate. PROCEDURE: Informed consent was obtained from the patient following explanation of the procedure, risks, benefits and alternatives. The patient understands, agrees and consents for the procedure. All questions were addressed. A time out was performed prior to the initiation of the procedure. Maximal barrier sterile technique utilized including caps, mask, sterile gowns, sterile gloves, large sterile drape, hand hygiene, and Betadine prep. The right common femoral artery was interrogated with ultrasound and found to be widely patent. An image was obtained and stored for the medical record. Local anesthesia was attained by infiltration with 1% lidocaine. A small dermatotomy was made. Under real-time sonographic guidance, the vessel was punctured with a 21 gauge micropuncture needle. Using standard technique, the initial micro needle was exchanged over a 0.018 micro wire for a transitional 4 [place] micro sheath. The micro sheath was then exchanged over a 0.035 wire for a 5 French vascular sheath. A C2 cobra catheter was advanced over a Bentson wire into the abdominal aorta. The catheter was used to select the celiac axis. An initial CT arteriogram was performed. There appears to be conventional hepatic arterial anatomy. The C2 cobra  catheter was advanced into the common hepatic artery over a glidewire. Additional digital subtraction arteriography was performed. The right gastric artery arises from the proximal gastroduodenal artery. The proper hepatic artery is relatively short. The left hepatic artery is easily identified. The middle hepatic artery arises from the proximal right hepatic artery. A renegade hi Flo microcatheter was then advanced coaxially through the 5 French catheter over a Fathom 16 wire. The catheter was advanced into the left hepatic artery. Arteriography was performed in multiple projections. Microcatheter was advanced more distally in the left hepatic artery. Ultimately, contrast injections demonstrate excellent opacification of the segment 2 and segment 3 hepatic arteries. There is faint tumor blush around the peripheral aspect of the known mass which straddles segments 2 and 3. The microcatheter was secured in position. Bland embolization was then performed using approximately 50% of a vial of 40-120 micron embospheres. Embolization was taken to near stasis. Post embolization arteriography demonstrates significant pruning of the tumoral vessels. The catheter system was removed. Hemostasis was attained with the assistance of a 5 [place] Celt closure device. IMPRESSION: Successful bland embolization of metastatic colon cancer within hepatic segments 2 and 3. Electronically Signed   By: Jacqulynn Cadet M.D.   On: 12/19/2020 13:07   IR Angiogram Selective Each Additional Vessel  Result Date: 12/19/2020 INDICATION: 80 year old male with right-sided colon cancer metastatic to the liver. He presents for planned hepatic arteriogram and bland embolization of the lesion in the left hepatic lobe as part of the stage therapy prior to microwave embolization. EXAM: IR ULTRASOUND GUIDANCE VASC ACCESS RIGHT; IR EMBO TUMOR ORGAN ISCHEMIA INFARCT INC GUIDE ROADMAPPING; ADDITIONAL ARTERIOGRAPHY; SELECTIVE VISCERAL ARTERIOGRAPHY  MEDICATIONS: 2 g cefoxitin. The antibiotic was administered within 1  hour of the procedure ANESTHESIA/SEDATION: Moderate (conscious) sedation was employed during this procedure. A total of Versed 2 mg and Fentanyl 100 mcg was administered intravenously. Moderate Sedation Time: 39 minutes. The patient's level of consciousness and vital signs were monitored continuously by radiology nursing throughout the procedure under my direct supervision. CONTRAST:  73mL OMNIPAQUE IOHEXOL 300 MG/ML SOLN, 16mL OMNIPAQUE IOHEXOL 300 MG/ML SOLN FLUOROSCOPY TIME:  Fluoroscopy Time: 5 minutes 18 seconds (763 mGy). COMPLICATIONS: None immediate. PROCEDURE: Informed consent was obtained from the patient following explanation of the procedure, risks, benefits and alternatives. The patient understands, agrees and consents for the procedure. All questions were addressed. A time out was performed prior to the initiation of the procedure. Maximal barrier sterile technique utilized including caps, mask, sterile gowns, sterile gloves, large sterile drape, hand hygiene, and Betadine prep. The right common femoral artery was interrogated with ultrasound and found to be widely patent. An image was obtained and stored for the medical record. Local anesthesia was attained by infiltration with 1% lidocaine. A small dermatotomy was made. Under real-time sonographic guidance, the vessel was punctured with a 21 gauge micropuncture needle. Using standard technique, the initial micro needle was exchanged over a 0.018 micro wire for a transitional 4 [place] micro sheath. The micro sheath was then exchanged over a 0.035 wire for a 5 French vascular sheath. A C2 cobra catheter was advanced over a Bentson wire into the abdominal aorta. The catheter was used to select the celiac axis. An initial CT arteriogram was performed. There appears to be conventional hepatic arterial anatomy. The C2 cobra catheter was advanced into the common hepatic artery over a  glidewire. Additional digital subtraction arteriography was performed. The right gastric artery arises from the proximal gastroduodenal artery. The proper hepatic artery is relatively short. The left hepatic artery is easily identified. The middle hepatic artery arises from the proximal right hepatic artery. A renegade hi Flo microcatheter was then advanced coaxially through the 5 French catheter over a Fathom 16 wire. The catheter was advanced into the left hepatic artery. Arteriography was performed in multiple projections. Microcatheter was advanced more distally in the left hepatic artery. Ultimately, contrast injections demonstrate excellent opacification of the segment 2 and segment 3 hepatic arteries. There is faint tumor blush around the peripheral aspect of the known mass which straddles segments 2 and 3. The microcatheter was secured in position. Bland embolization was then performed using approximately 50% of a vial of 40-120 micron embospheres. Embolization was taken to near stasis. Post embolization arteriography demonstrates significant pruning of the tumoral vessels. The catheter system was removed. Hemostasis was attained with the assistance of a 5 [place] Celt closure device. IMPRESSION: Successful bland embolization of metastatic colon cancer within hepatic segments 2 and 3. Electronically Signed   By: Jacqulynn Cadet M.D.   On: 12/19/2020 13:07   IR US Guide Vasc Access Right  Result Date: 12/19/2020 INDICATION: 80 year old male with right-sided colon cancer metastatic to the liver. He presents for planned hepatic arteriogram and bland embolization of the lesion in the left hepatic lobe as part of the stage therapy prior to microwave embolization. EXAM: IR ULTRASOUND GUIDANCE VASC ACCESS RIGHT; IR EMBO TUMOR ORGAN ISCHEMIA INFARCT INC GUIDE ROADMAPPING; ADDITIONAL ARTERIOGRAPHY; SELECTIVE VISCERAL ARTERIOGRAPHY MEDICATIONS: 2 g cefoxitin. The antibiotic was administered within 1 hour of the  procedure ANESTHESIA/SEDATION: Moderate (conscious) sedation was employed during this procedure. A total of Versed 2 mg and Fentanyl 100 mcg was administered intravenously. Moderate Sedation Time: 39 minutes. The  patient's level of consciousness and vital signs were monitored continuously by radiology nursing throughout the procedure under my direct supervision. CONTRAST:  23mL OMNIPAQUE IOHEXOL 300 MG/ML SOLN, 53mL OMNIPAQUE IOHEXOL 300 MG/ML SOLN FLUOROSCOPY TIME:  Fluoroscopy Time: 5 minutes 18 seconds (763 mGy). COMPLICATIONS: None immediate. PROCEDURE: Informed consent was obtained from the patient following explanation of the procedure, risks, benefits and alternatives. The patient understands, agrees and consents for the procedure. All questions were addressed. A time out was performed prior to the initiation of the procedure. Maximal barrier sterile technique utilized including caps, mask, sterile gowns, sterile gloves, large sterile drape, hand hygiene, and Betadine prep. The right common femoral artery was interrogated with ultrasound and found to be widely patent. An image was obtained and stored for the medical record. Local anesthesia was attained by infiltration with 1% lidocaine. A small dermatotomy was made. Under real-time sonographic guidance, the vessel was punctured with a 21 gauge micropuncture needle. Using standard technique, the initial micro needle was exchanged over a 0.018 micro wire for a transitional 4 [place] micro sheath. The micro sheath was then exchanged over a 0.035 wire for a 5 French vascular sheath. A C2 cobra catheter was advanced over a Bentson wire into the abdominal aorta. The catheter was used to select the celiac axis. An initial CT arteriogram was performed. There appears to be conventional hepatic arterial anatomy. The C2 cobra catheter was advanced into the common hepatic artery over a glidewire. Additional digital subtraction arteriography was performed. The right  gastric artery arises from the proximal gastroduodenal artery. The proper hepatic artery is relatively short. The left hepatic artery is easily identified. The middle hepatic artery arises from the proximal right hepatic artery. A renegade hi Flo microcatheter was then advanced coaxially through the 5 French catheter over a Fathom 16 wire. The catheter was advanced into the left hepatic artery. Arteriography was performed in multiple projections. Microcatheter was advanced more distally in the left hepatic artery. Ultimately, contrast injections demonstrate excellent opacification of the segment 2 and segment 3 hepatic arteries. There is faint tumor blush around the peripheral aspect of the known mass which straddles segments 2 and 3. The microcatheter was secured in position. Bland embolization was then performed using approximately 50% of a vial of 40-120 micron embospheres. Embolization was taken to near stasis. Post embolization arteriography demonstrates significant pruning of the tumoral vessels. The catheter system was removed. Hemostasis was attained with the assistance of a 5 [place] Celt closure device. IMPRESSION: Successful bland embolization of metastatic colon cancer within hepatic segments 2 and 3. Electronically Signed   By: Jacqulynn Cadet M.D.   On: 12/19/2020 13:07   IR EMBO TUMOR ORGAN ISCHEMIA INFARCT INC GUIDE ROADMAPPING  Result Date: 12/19/2020 INDICATION: 80 year old male with right-sided colon cancer metastatic to the liver. He presents for planned hepatic arteriogram and bland embolization of the lesion in the left hepatic lobe as part of the stage therapy prior to microwave embolization. EXAM: IR ULTRASOUND GUIDANCE VASC ACCESS RIGHT; IR EMBO TUMOR ORGAN ISCHEMIA INFARCT INC GUIDE ROADMAPPING; ADDITIONAL ARTERIOGRAPHY; SELECTIVE VISCERAL ARTERIOGRAPHY MEDICATIONS: 2 g cefoxitin. The antibiotic was administered within 1 hour of the procedure ANESTHESIA/SEDATION: Moderate (conscious)  sedation was employed during this procedure. A total of Versed 2 mg and Fentanyl 100 mcg was administered intravenously. Moderate Sedation Time: 39 minutes. The patient's level of consciousness and vital signs were monitored continuously by radiology nursing throughout the procedure under my direct supervision. CONTRAST:  50mL OMNIPAQUE IOHEXOL 300 MG/ML SOLN, 37mL  OMNIPAQUE IOHEXOL 300 MG/ML SOLN FLUOROSCOPY TIME:  Fluoroscopy Time: 5 minutes 18 seconds (763 mGy). COMPLICATIONS: None immediate. PROCEDURE: Informed consent was obtained from the patient following explanation of the procedure, risks, benefits and alternatives. The patient understands, agrees and consents for the procedure. All questions were addressed. A time out was performed prior to the initiation of the procedure. Maximal barrier sterile technique utilized including caps, mask, sterile gowns, sterile gloves, large sterile drape, hand hygiene, and Betadine prep. The right common femoral artery was interrogated with ultrasound and found to be widely patent. An image was obtained and stored for the medical record. Local anesthesia was attained by infiltration with 1% lidocaine. A small dermatotomy was made. Under real-time sonographic guidance, the vessel was punctured with a 21 gauge micropuncture needle. Using standard technique, the initial micro needle was exchanged over a 0.018 micro wire for a transitional 4 [place] micro sheath. The micro sheath was then exchanged over a 0.035 wire for a 5 French vascular sheath. A C2 cobra catheter was advanced over a Bentson wire into the abdominal aorta. The catheter was used to select the celiac axis. An initial CT arteriogram was performed. There appears to be conventional hepatic arterial anatomy. The C2 cobra catheter was advanced into the common hepatic artery over a glidewire. Additional digital subtraction arteriography was performed. The right gastric artery arises from the proximal gastroduodenal  artery. The proper hepatic artery is relatively short. The left hepatic artery is easily identified. The middle hepatic artery arises from the proximal right hepatic artery. A renegade hi Flo microcatheter was then advanced coaxially through the 5 French catheter over a Fathom 16 wire. The catheter was advanced into the left hepatic artery. Arteriography was performed in multiple projections. Microcatheter was advanced more distally in the left hepatic artery. Ultimately, contrast injections demonstrate excellent opacification of the segment 2 and segment 3 hepatic arteries. There is faint tumor blush around the peripheral aspect of the known mass which straddles segments 2 and 3. The microcatheter was secured in position. Bland embolization was then performed using approximately 50% of a vial of 40-120 micron embospheres. Embolization was taken to near stasis. Post embolization arteriography demonstrates significant pruning of the tumoral vessels. The catheter system was removed. Hemostasis was attained with the assistance of a 5 [place] Celt closure device. IMPRESSION: Successful bland embolization of metastatic colon cancer within hepatic segments 2 and 3. Electronically Signed   By: Jacqulynn Cadet M.D.   On: 12/19/2020 13:07   IR Radiologist Eval & Mgmt  Result Date: 12/05/2020 Please refer to notes tab for details about interventional procedure. (Op Note)   ASSESSMENT & PLAN:   Cancer of right colon ([place]) #Right-sided colon adenocarcinoma-with synchronous metastasis to liver/unresectable. Currently on FOLFIRI plus Avastinon HOLD; MRI FEB 2022- over stable liver disease disease; but peptic ulcer [see below]  # HOLD further chemo [sec to poor tolerance]; currently s/p liver ablation on 3/10; awaiting repeat on 3/30.  Reviewed the significance of elevated CEA; will recommend repeating a scan in approximately 6 weeks post ablation.  # Peptic ulcer disease/duodenal ulcer-NSAID related.  S/p EGD on  2/16-continue PPI.  # PN- G-1-2; sec to oxaliplatin-STABLE.   # Mediport- port flush q 2-3 months  # DISPOSITION:  # follow up in 4 weeks- MD; labs- cbc/cmp CEA/port flush- Dr.B    All questions were answered. The patient knows to call the clinic with any problems, questions or concerns.    Cammie Sickle, MD 12/31/2020 11:24 AM

## 2021-01-01 LAB — CEA: CEA: 20 ng/mL — ABNORMAL HIGH (ref 0.0–4.7)

## 2021-01-03 NOTE — Patient Instructions (Addendum)
DUE TO COVID-19 ONLY ONE VISITOR IS ALLOWED TO COME WITH YOU AND STAY IN THE WAITING ROOM ONLY DURING PRE OP AND PROCEDURE DAY OF SURGERY. THE 1 VISITOR  MAY VISIT WITH YOU AFTER SURGERY IN YOUR PRIVATE ROOM DURING VISITING HOURS ONLY!  YOU NEED TO HAVE A COVID 19 TEST ON_3/26_____ @_10 :45______, THIS TEST MUST BE DONE BEFORE SURGERY,  COVID TESTING SITE [place] [id number], IT IS ON THE RIGHT GOING OUT [address] APPROXIMATELY  2 MINUTES PAST [employer] ON THE RIGHT. ONCE YOUR COVID TEST IS COMPLETED,  PLEASE BEGIN THE QUARANTINE INSTRUCTIONS AS OUTLINED IN YOUR HANDOUT.                Oscar [name]    Your procedure is scheduled on: 01/15/21   Report to [facility] Main  Entrance   Report to admitting at   6:45 AM     Call this number if you have problems the morning of surgery [phone]    Remember: Do not eat food or drink liquids :After Midnight.   BRUSH YOUR TEETH MORNING OF SURGERY AND RINSE YOUR MOUTH OUT, NO CHEWING GUM CANDY OR MINTS.     Take these medicines the morning of surgery with A SIP OF WATER: none                                You may not have any metal on your body including              piercings  Do not wear jewelry,  lotions, powders or deodorant              Men may shave face and neck.   Do not bring valuables to the hospital. [place].  Contacts, dentures or bridgework may not be worn into surgery.       Special Instructions: N/A              Please read over the following fact sheets you were given: _____________________________________________________________________             [facility] - Preparing for Surgery Before surgery, you can play an important role.  Because skin is not sterile, your skin needs to be as free of germs as possible.  You can reduce the number of germs on your skin by washing with CHG (chlorahexidine gluconate) soap  before surgery.  CHG is an antiseptic cleaner which kills germs and bonds with the skin to continue killing germs even after washing. Please DO NOT use if you have an allergy to CHG or antibacterial soaps.  If your skin becomes reddened/irritated stop using the CHG and inform your nurse when you arrive at Short Stay.   You may shave your face/neck.  Please follow these instructions carefully:  1.  Shower with CHG Soap the night before surgery and the  morning of Surgery.  2.  If you choose to wash your hair, wash your hair first as usual with your  normal  shampoo.  3.  After you shampoo, rinse your hair and body thoroughly to remove the  shampoo.  4.  Use CHG as you would any other liquid soap.  You can apply chg directly  to the skin and wash                       Gently with a scrungie or clean washcloth.  5.  Apply the CHG Soap to your body ONLY FROM THE NECK DOWN.   Do not use on face/ open                           Wound or open sores. Avoid contact with eyes, ears mouth and genitals (private parts).                       Wash face,  Genitals (private parts) with your normal soap.             6.  Wash thoroughly, paying special attention to the area where your surgery  will be performed.  7.  Thoroughly rinse your body with warm water from the neck down.  8.  DO NOT shower/wash with your normal soap after using and rinsing off  the CHG Soap.             9.  Pat yourself dry with a clean towel.            10.  Wear clean pajamas.            11.  Place clean sheets on your bed the night of your first shower and do not  sleep with pets. Day of Surgery : Do not apply any lotions/deodorants the morning of surgery.  Please wear clean clothes to the hospital/surgery center.  FAILURE TO FOLLOW THESE INSTRUCTIONS MAY RESULT IN THE CANCELLATION OF YOUR SURGERY PATIENT SIGNATURE_________________________________  NURSE  SIGNATURE__________________________________  ________________________________________________________________________

## 2021-01-06 ENCOUNTER — Other Ambulatory Visit: Payer: Self-pay

## 2021-01-06 ENCOUNTER — Encounter (HOSPITAL_COMMUNITY)
Admission: RE | Admit: 2021-01-06 | Discharge: 2021-01-06 | Disposition: A | Discharge status: Home / Self Care | Payer: Medicare HMO | Source: Ambulatory Visit | Attending: Interventional Radiology | Admitting: Interventional Radiology

## 2021-01-06 ENCOUNTER — Encounter (HOSPITAL_COMMUNITY): Payer: Self-pay

## 2021-01-06 DIAGNOSIS — Z01812 Encounter for preprocedural laboratory examination: Secondary | ICD-10-CM | POA: Diagnosis not present

## 2021-01-06 HISTORY — DX: Prediabetes: R73.03

## 2021-01-06 HISTORY — DX: Family history of other specified conditions: Z84.89

## 2021-01-06 HISTORY — DX: Myoneural disorder, unspecified: G70.9

## 2021-01-06 LAB — BASIC METABOLIC PANEL
Anion gap: 8 (ref 5–15)
BUN: 11 mg/dL (ref 8–23)
CO2: 26 mmol/L (ref 22–32)
Calcium: 8.8 mg/dL — ABNORMAL LOW (ref 8.9–10.3)
Chloride: 106 mmol/L (ref 98–111)
Creatinine, Ser: 0.64 mg/dL (ref 0.61–1.24)
GFR, Estimated: >60 mL/min (ref >60)
Glucose, Bld: 96 mg/dL (ref 70–99)
Potassium: 4.3 mmol/L (ref 3.5–5.1)
Sodium: 140 mmol/L (ref 135–145)

## 2021-01-06 LAB — HEMOGLOBIN A1C
Hgb A1c MFr Bld: 5.8 % — ABNORMAL HIGH (ref 4.8–5.6)
Mean Plasma Glucose: 119.76 mg/dL

## 2021-01-06 LAB — CBC
HCT: 40 % (ref 39.0–52.0)
Hemoglobin: 12.7 g/dL — ABNORMAL LOW (ref 13.0–17.0)
MCH: 32.5 pg (ref 26.0–34.0)
MCHC: 31.8 g/dL (ref 30.0–36.0)
MCV: 102.3 fL — ABNORMAL HIGH (ref 80.0–100.0)
Platelets: 203 10*3/uL (ref 150–400)
RBC: 3.91 MIL/uL — ABNORMAL LOW (ref 4.22–5.81)
RDW: 14.1 % (ref 11.5–15.5)
WBC: 6.1 10*3/uL (ref 4.0–10.5)
nRBC: 0 % (ref 0.0–0.2)

## 2021-01-06 NOTE — Progress Notes (Signed)
COVID Vaccine Completed:Yes Date COVID Vaccine completed:12/30/19 COVID vaccine manufacturer:   Moderna     PCP - Dr. Truddie Coco Cardiologist - no  Chest x-ray - no EKG - 11/09/20-epic Stress Test - no ECHO - no Cardiac Cath - no Pacemaker/ICD device last checked:NA  Sleep Study - yes-negative results CPAP - no  Fasting Blood Sugar - Pt doesn't test he is pre-diabetic now Checks Blood Sugar _____ times a day  Blood Thinner Instructions:NA Aspirin Instructions: Last Dose:  Anesthesia review:   Patient denies shortness of breath, fever, cough and chest pain at PAT appointment yes  Patient verbalized understanding of instructions that were given to them at the PAT appointment. Patient was also instructed that they will need to review over the PAT instructions again at home before surgery.Yes Pt had a weight loss and was taken off diabetic and blood pressure medication. He developed a slight tremor in 2019. He reports weak arms and legs but no SOB.

## 2021-01-11 ENCOUNTER — Other Ambulatory Visit (HOSPITAL_COMMUNITY)
Admission: RE | Admit: 2021-01-11 | Discharge: 2021-01-11 | Disposition: A | Discharge status: Home / Self Care | Payer: Medicare HMO | Source: Ambulatory Visit | Attending: Interventional Radiology | Admitting: Interventional Radiology

## 2021-01-11 DIAGNOSIS — Z01812 Encounter for preprocedural laboratory examination: Secondary | ICD-10-CM | POA: Insufficient documentation

## 2021-01-11 DIAGNOSIS — Z20822 Contact with and (suspected) exposure to covid-19: Secondary | ICD-10-CM | POA: Insufficient documentation

## 2021-01-11 LAB — SARS CORONAVIRUS 2 (TAT 6-24 HRS): SARS Coronavirus 2: NEGATIVE

## 2021-01-13 ENCOUNTER — Other Ambulatory Visit: Payer: Self-pay | Admitting: Radiology

## 2021-01-14 ENCOUNTER — Other Ambulatory Visit: Payer: Self-pay | Admitting: Radiology

## 2021-01-15 ENCOUNTER — Other Ambulatory Visit (HOSPITAL_COMMUNITY): Payer: Self-pay | Admitting: Physician Assistant

## 2021-01-15 ENCOUNTER — Other Ambulatory Visit: Payer: Self-pay | Admitting: Physician Assistant

## 2021-01-15 ENCOUNTER — Encounter (HOSPITAL_COMMUNITY): Payer: Self-pay | Admitting: Interventional Radiology

## 2021-01-15 ENCOUNTER — Ambulatory Visit (HOSPITAL_COMMUNITY): Payer: Medicare HMO | Admitting: Registered Nurse

## 2021-01-15 ENCOUNTER — Encounter (HOSPITAL_COMMUNITY): Payer: Self-pay

## 2021-01-15 ENCOUNTER — Observation Stay (HOSPITAL_COMMUNITY)
Admission: RE | Admit: 2021-01-15 | Discharge: 2021-01-15 | Disposition: A | Discharge status: Home / Self Care | Payer: Medicare HMO | Attending: Interventional Radiology | Admitting: Interventional Radiology

## 2021-01-15 ENCOUNTER — Encounter (HOSPITAL_COMMUNITY)
Admission: RE | Disposition: A | Discharge status: Home / Self Care | Payer: Self-pay | Source: Home / Self Care | Attending: Interventional Radiology

## 2021-01-15 ENCOUNTER — Other Ambulatory Visit: Payer: Self-pay

## 2021-01-15 ENCOUNTER — Ambulatory Visit (HOSPITAL_COMMUNITY): Payer: Medicare HMO

## 2021-01-15 ENCOUNTER — Ambulatory Visit (HOSPITAL_COMMUNITY)
Admission: RE | Admit: 2021-01-15 | Discharge: 2021-01-15 | Disposition: A | Discharge status: Home / Self Care | Payer: Medicare HMO | Source: Ambulatory Visit | Attending: Interventional Radiology | Admitting: Interventional Radiology

## 2021-01-15 DIAGNOSIS — E785 Hyperlipidemia, unspecified: Secondary | ICD-10-CM | POA: Diagnosis not present

## 2021-01-15 DIAGNOSIS — C189 Malignant neoplasm of colon, unspecified: Secondary | ICD-10-CM

## 2021-01-15 DIAGNOSIS — K7689 Other specified diseases of liver: Secondary | ICD-10-CM | POA: Diagnosis present

## 2021-01-15 DIAGNOSIS — C787 Secondary malignant neoplasm of liver and intrahepatic bile duct: Secondary | ICD-10-CM

## 2021-01-15 DIAGNOSIS — K269 Duodenal ulcer, unspecified as acute or chronic, without hemorrhage or perforation: Secondary | ICD-10-CM | POA: Diagnosis not present

## 2021-01-15 DIAGNOSIS — Z96652 Presence of left artificial knee joint: Secondary | ICD-10-CM | POA: Diagnosis not present

## 2021-01-15 DIAGNOSIS — Z79899 Other long term (current) drug therapy: Secondary | ICD-10-CM | POA: Diagnosis not present

## 2021-01-15 DIAGNOSIS — C19 Malignant neoplasm of rectosigmoid junction: Secondary | ICD-10-CM | POA: Diagnosis not present

## 2021-01-15 DIAGNOSIS — F17210 Nicotine dependence, cigarettes, uncomplicated: Secondary | ICD-10-CM | POA: Insufficient documentation

## 2021-01-15 DIAGNOSIS — Z888 Allergy status to other drugs, medicaments and biological substances status: Secondary | ICD-10-CM | POA: Diagnosis not present

## 2021-01-15 DIAGNOSIS — I10 Essential (primary) hypertension: Secondary | ICD-10-CM | POA: Diagnosis not present

## 2021-01-15 DIAGNOSIS — R911 Solitary pulmonary nodule: Secondary | ICD-10-CM | POA: Diagnosis not present

## 2021-01-15 DIAGNOSIS — Z01818 Encounter for other preprocedural examination: Secondary | ICD-10-CM

## 2021-01-15 HISTORY — PX: RADIOLOGY WITH ANESTHESIA: SHX6223

## 2021-01-15 LAB — COMPREHENSIVE METABOLIC PANEL
ALT: 25 U/L (ref 0–44)
AST: 43 U/L — ABNORMAL HIGH (ref 15–41)
Albumin: 3.5 g/dL (ref 3.5–5.0)
Alkaline Phosphatase: 176 U/L — ABNORMAL HIGH (ref 38–126)
Anion gap: 8 (ref 5–15)
BUN: 12 mg/dL (ref 8–23)
CO2: 25 mmol/L (ref 22–32)
Calcium: 8.6 mg/dL — ABNORMAL LOW (ref 8.9–10.3)
Chloride: 106 mmol/L (ref 98–111)
Creatinine, Ser: 0.82 mg/dL (ref 0.61–1.24)
GFR, Estimated: >60 mL/min (ref >60)
Glucose, Bld: 117 mg/dL — ABNORMAL HIGH (ref 70–99)
Potassium: 4 mmol/L (ref 3.5–5.1)
Sodium: 139 mmol/L (ref 135–145)
Total Bilirubin: 0.7 mg/dL (ref 0.3–1.2)
Total Protein: 7.7 g/dL (ref 6.5–8.1)

## 2021-01-15 LAB — CBC WITH DIFFERENTIAL/PLATELET
Abs Immature Granulocytes: 0.02 10*3/uL (ref 0.00–0.07)
Basophils Absolute: 0.1 10*3/uL (ref 0.0–0.1)
Basophils Relative: 1 %
Eosinophils Absolute: 0.5 10*3/uL (ref 0.0–0.5)
Eosinophils Relative: 9 %
HCT: 38.1 % — ABNORMAL LOW (ref 39.0–52.0)
Hemoglobin: 12.5 g/dL — ABNORMAL LOW (ref 13.0–17.0)
Immature Granulocytes: 0 %
Lymphocytes Relative: 21 %
Lymphs Abs: 1.1 10*3/uL (ref 0.7–4.0)
MCH: 33.3 pg (ref 26.0–34.0)
MCHC: 32.8 g/dL (ref 30.0–36.0)
MCV: 101.6 fL — ABNORMAL HIGH (ref 80.0–100.0)
Monocytes Absolute: 0.5 10*3/uL (ref 0.1–1.0)
Monocytes Relative: 9 %
Neutro Abs: 3.1 10*3/uL (ref 1.7–7.7)
Neutrophils Relative %: 60 %
Platelets: 150 10*3/uL (ref 150–400)
RBC: 3.75 MIL/uL — ABNORMAL LOW (ref 4.22–5.81)
RDW: 13.6 % (ref 11.5–15.5)
WBC: 5.2 10*3/uL (ref 4.0–10.5)
nRBC: 0 % (ref 0.0–0.2)

## 2021-01-15 LAB — PROTIME-INR
INR: 0.9 (ref 0.8–1.2)
Prothrombin Time: 12.1 seconds (ref 11.4–15.2)

## 2021-01-15 LAB — TYPE AND SCREEN
ABO/RH(D): O NEG
Antibody Screen: NEGATIVE

## 2021-01-15 LAB — ABO/RH: ABO/RH(D): O NEG

## 2021-01-15 IMAGING — CT CT GUIDANCE TISSUE ABLATION
4 of 10 series · 16 of 36 positions shown, 18 images · non-contrast
Comparison: MRI abdomen [DATE]

INDICATION: 79-year-old male with colorectal cancer metastatic to the liver. He
presents for percutaneous thermal ablation having previously
undergone transarterial bland embolization on [DATE].

EXAM:
CT-guided microwave ablation of liver lesion
TECHNIQUE: Informed written consent was obtained from the patient after a
thorough discussion of the procedural risks, benefits and
alternatives. All questions were addressed. Maximal Sterile Barrier
Technique was utilized including caps, mask, sterile gowns, sterile
gloves, sterile drape, hand hygiene and skin antiseptic. A timeout
was performed prior to the initiation of the procedure.

[Series 4: i-spiral 2.0 bf37 · axial · 0.80mm/px · z∈[-206,-68]mm · 5 of 117 slices shown, 7 images (1 of 4)]
[im 24/117  mediastinal]
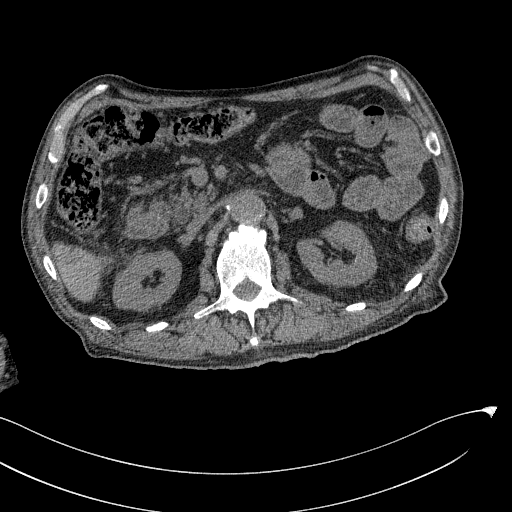
[im 24/117  lung]
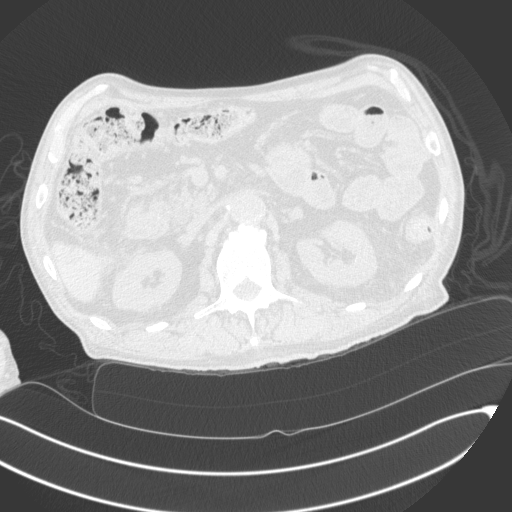
[im 47/117  lung]
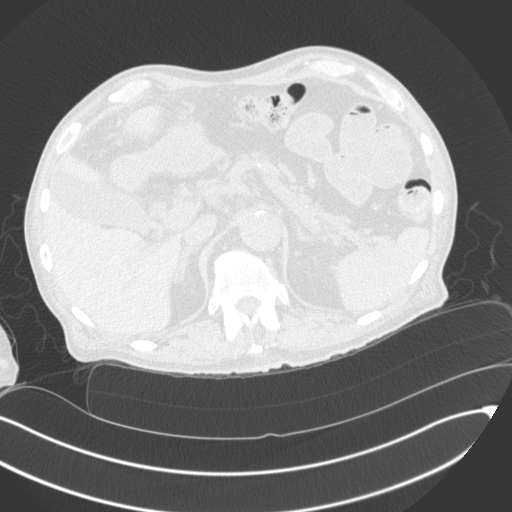
[im 54/117  lung]
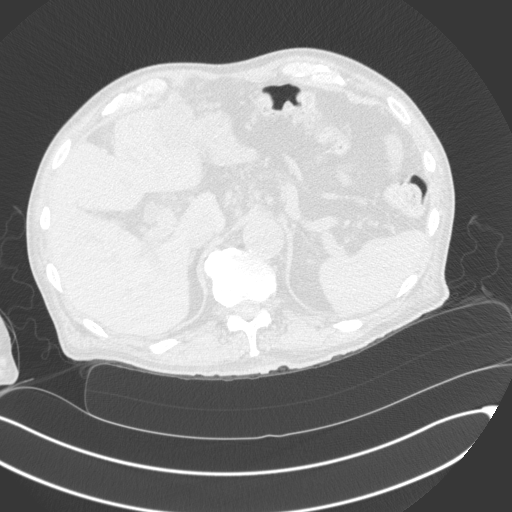
[im 70/117  lung]
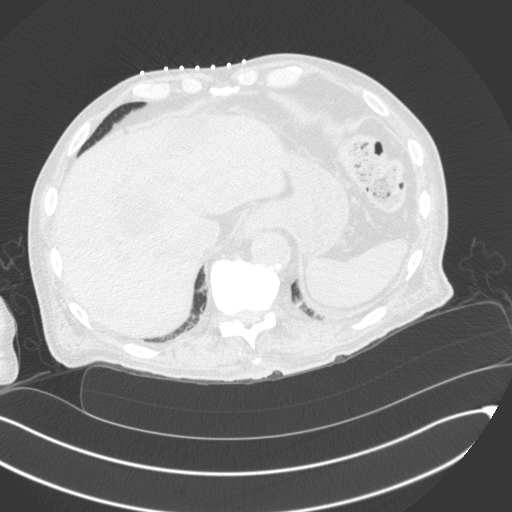
[im 93/117  mediastinal]
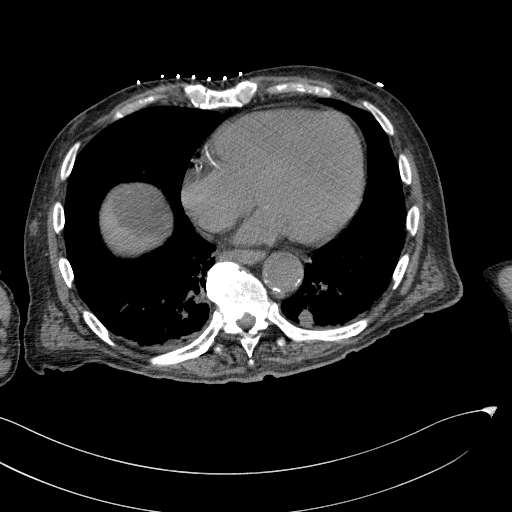
[im 93/117  lung]
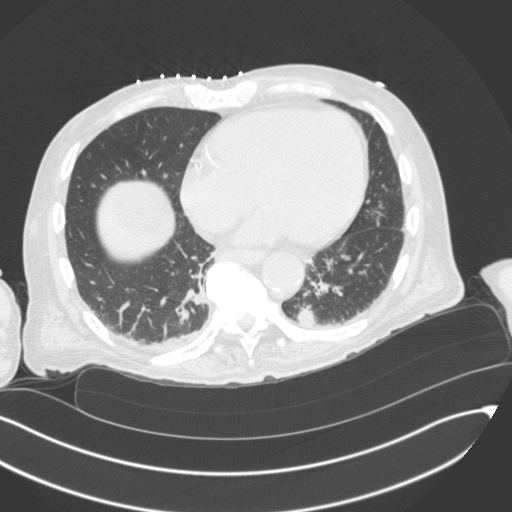

[Series 18: i-spiral 2.0 bf37 · axial · 0.80mm/px · z∈[-194,-78]mm · 4 of 116 slices shown (2 of 4)]
[im 29/116  lung]
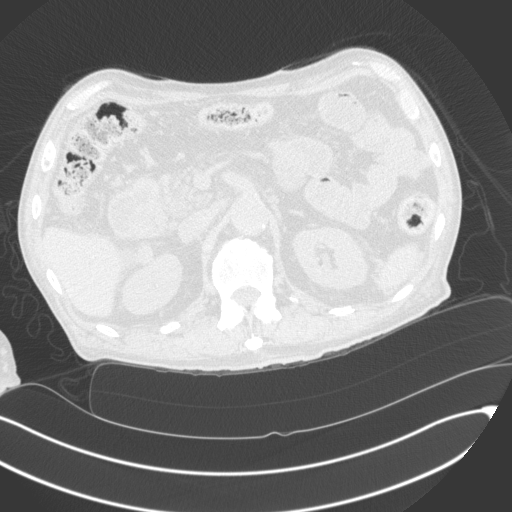
[im 53/116  lung]
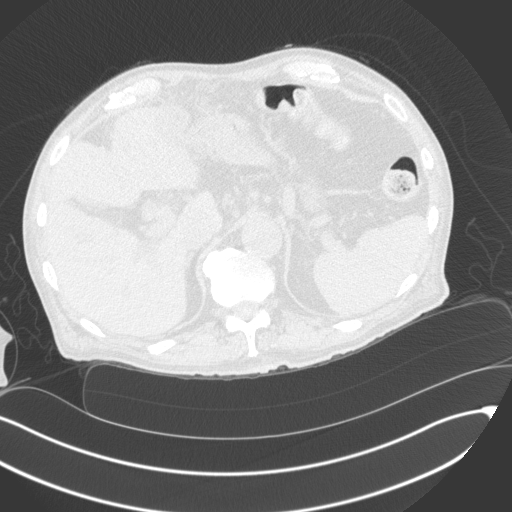
[im 58/116  lung]
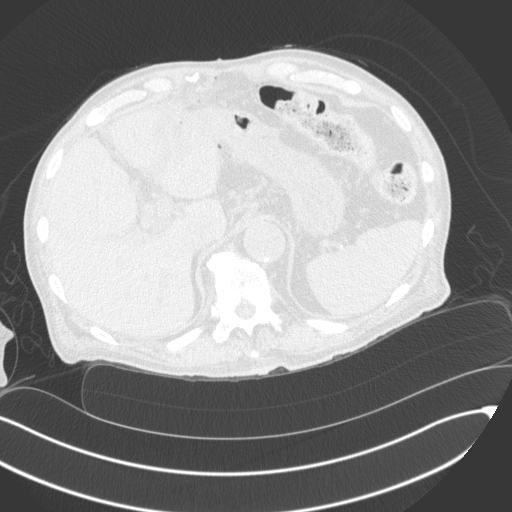
[im 87/116  lung]
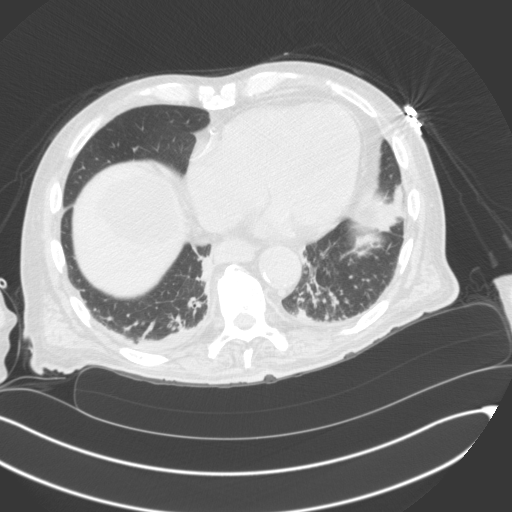

[Series 21: i-spiral 2.0 bf37 · axial · 0.80mm/px · z∈[-194,-78]mm · 4 of 116 slices shown (3 of 4)]
[im 29/116  lung]
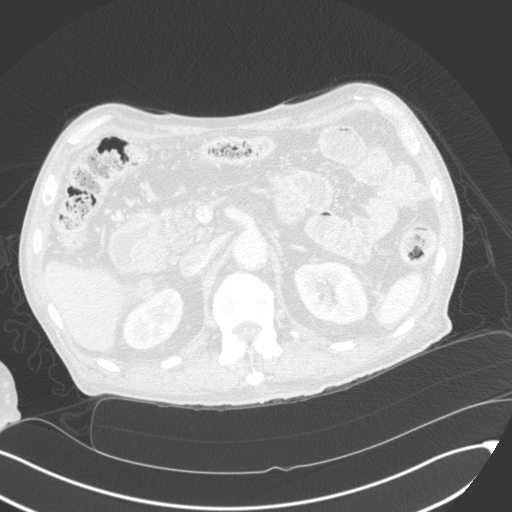
[im 53/116  lung]
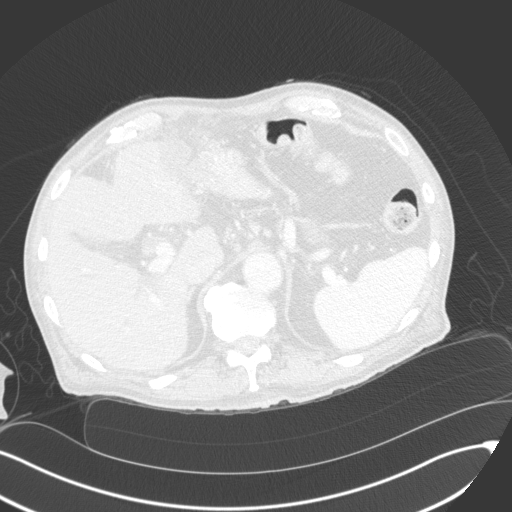
[im 58/116  lung]
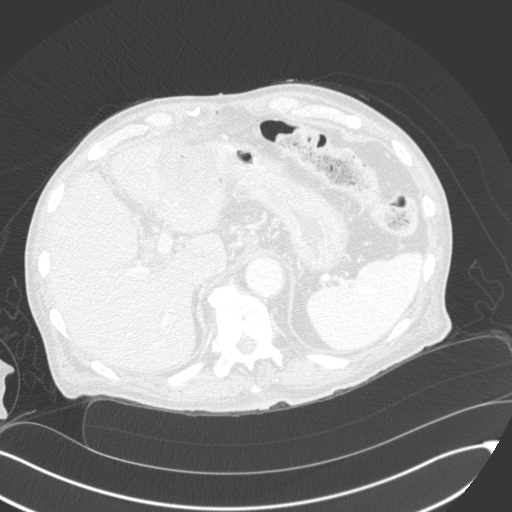
[im 87/116  lung]
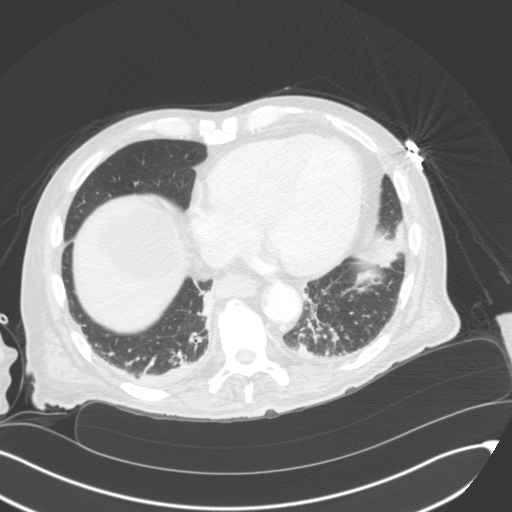

[Series 22: i-spiral 2.0 bf37 · axial · 0.80mm/px · z∈[-194,-136]mm · 3 of 116 slices shown (4 of 4)]
[im 29/116  lung]
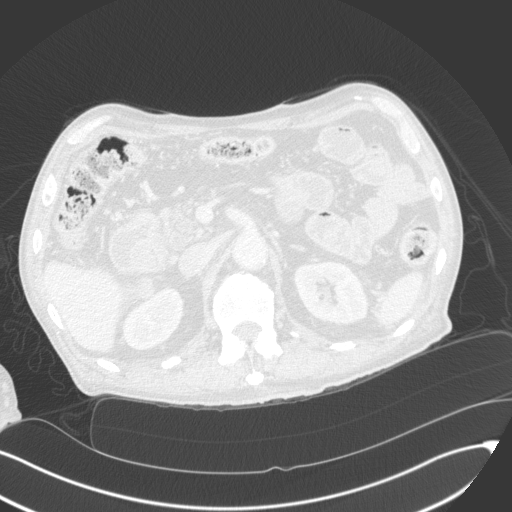
[im 53/116  lung]
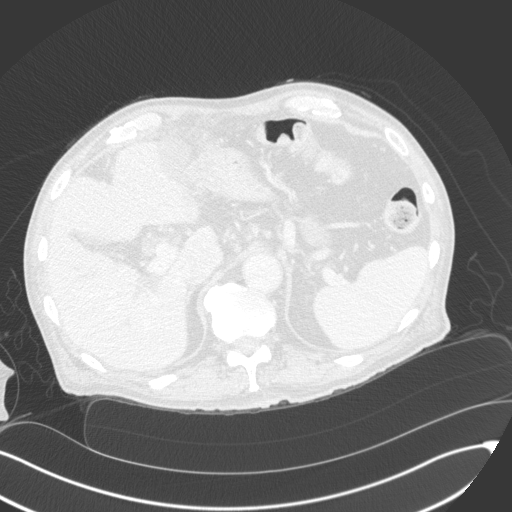
[im 58/116  lung]
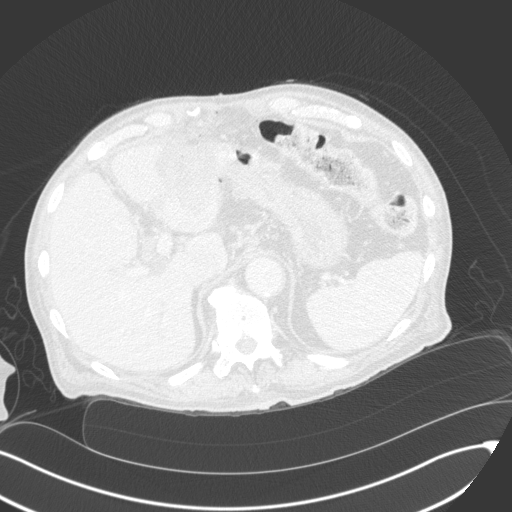

[16 of 36 positions shown; findings below may reference images not displayed]

MEDICATIONS:
2 g Ancef; The antibiotic was administered in an appropriate time
interval prior to needle puncture of the skin.

ANESTHESIA/SEDATION:
General - as administered by the Anesthesia department

FLUOROSCOPY TIME:  None.

COMPLICATIONS:
None immediate.
Limited ultrasound evaluation of the right liver was performed while
the patient was on the CT gantry. Due to relatively high positioning
of the left hepatic lobe and overall heterogeneity of the liver, the
mass was not easily visualized. Therefore, axial CT imaging was
obtained. The low-attenuation mass in the left hepatic lobe was
successfully identified. The mass is easily visible without
intravenous contrast. Under intermittent CT guidance, a total of 3
NeuWave PR XT probes were advanced into the mass in a triangular
configuration with probes oriented parallel and spaced 1-1.5 cm
apart. Follow-up axial CT imaging was performed confirming probe
placement and the absence of any complication from a probe
placement. Percutaneous ablation was then performed with each probe
powered at 65 DUSTIN for a total of 10 minutes.

Intermittent CT imaging during the course of the ablation
demonstrates a well-formed ablation zone and no evidence of
immediate complication. Following probe removal, a triple phase CT
scan was performed. No evidence of hemorrhage, pneumothorax or other
complicating feature. The ablation zone appears to entirely
encompass the targeted mass.
FINDINGS: Technically successful percutaneous microwave ablation.
IMPRESSION: Technically successful percutaneous microwave ablation.

## 2021-01-15 IMAGING — DX DG CHEST 1V
1 series · 1 of 1 positions shown · non-contrast
Comparison: [DATE]

CLINICAL DATA: Metastatic colon cancer.  Pre liver ablation.

EXAM:
CHEST  1 VIEW

[chest pa]
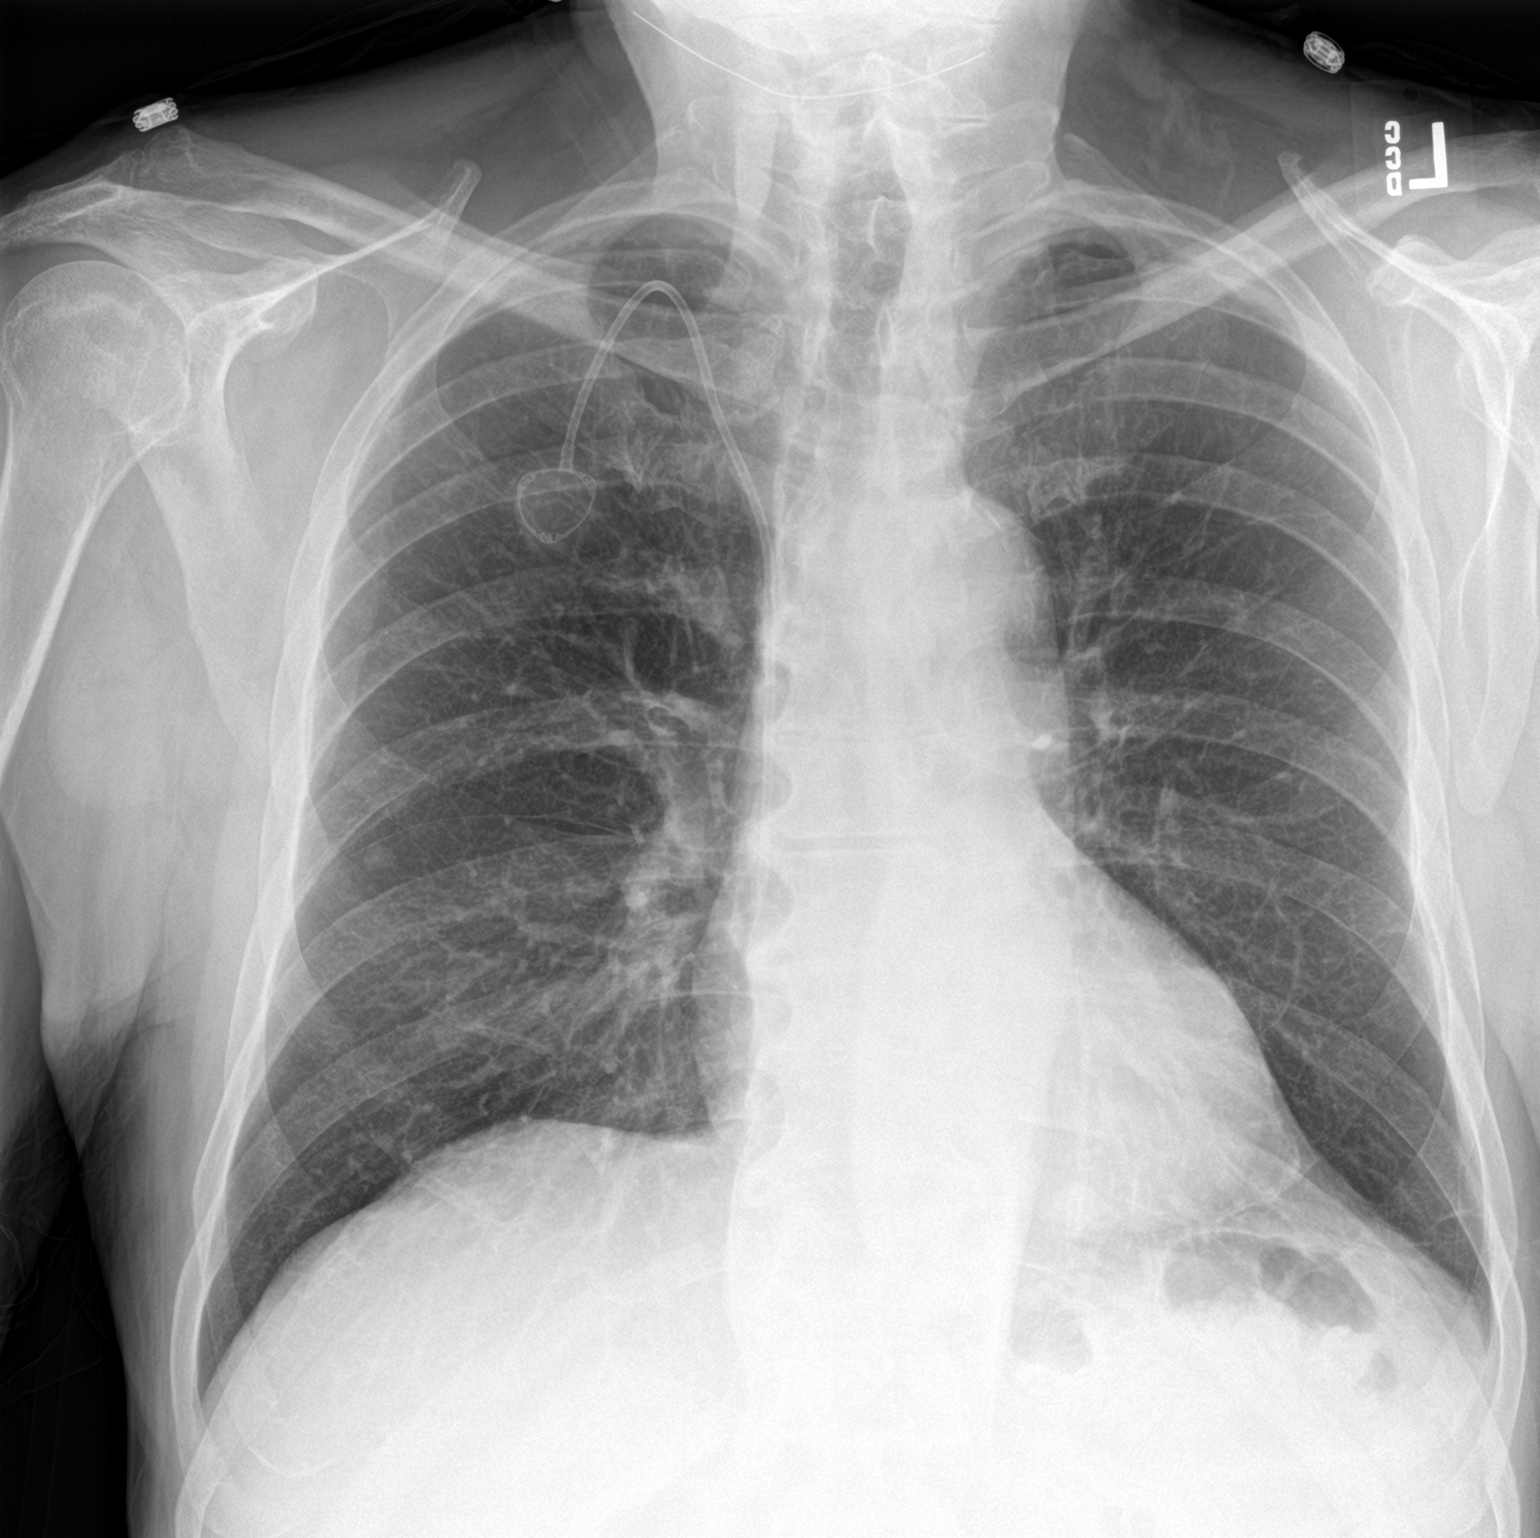

[1 of 1 positions shown; findings below may reference images not displayed]

FINDINGS: The lungs are symmetrically well expanded. Since the prior
examination, an 8 mm nodule has developed within the right mid lung
zone, indeterminate. No pneumothorax or pleural effusion. Cardiac
size within normal limits. Right internal jugular chest port is seen
with its tip at the superior cavoatrial junction. The pulmonary
vascularity is normal. No acute bone abnormality.
IMPRESSION: Interval development of an 8 mm pulmonary nodule within the right
mid lung zone, indeterminate. This would be better assessed with CT
imaging.

No radiographic evidence of acute cardiopulmonary disease.

## 2021-01-15 SURGERY — CT WITH ANESTHESIA
Anesthesia: General

## 2021-01-15 MED ORDER — HYDROCODONE-ACETAMINOPHEN 5-325 MG PO TABS: 1.0000 | ORAL_TABLET | ORAL | Status: DC | PRN

## 2021-01-15 MED ORDER — KETOROLAC TROMETHAMINE 30 MG/ML IJ SOLN
INTRAMUSCULAR | Status: AC
  Filled 2021-01-15: qty 1

## 2021-01-15 MED ORDER — OXYCODONE HCL 5 MG PO TABS
5.0000 mg | ORAL_TABLET | Freq: Once | ORAL | Status: DC | PRN
Start: 2021-01-15 — End: 2021-01-15

## 2021-01-15 MED ORDER — SUGAMMADEX SODIUM 200 MG/2ML IV SOLN
INTRAVENOUS | Status: DC | PRN
  Administered 2021-01-15: 200 mg via INTRAVENOUS

## 2021-01-15 MED ORDER — PHENYLEPHRINE 40 MCG/ML (10ML) SYRINGE FOR IV PUSH (FOR BLOOD PRESSURE SUPPORT)
PREFILLED_SYRINGE | INTRAVENOUS | Status: DC | PRN
  Administered 2021-01-15 (×3): 80 ug via INTRAVENOUS

## 2021-01-15 MED ORDER — DEXAMETHASONE SODIUM PHOSPHATE 10 MG/ML IJ SOLN
INTRAMUSCULAR | Status: DC | PRN
  Administered 2021-01-15: 8 mg via INTRAVENOUS

## 2021-01-15 MED ORDER — CHLORHEXIDINE GLUCONATE 0.12 % MT SOLN
15.0000 mL | Freq: Once | OROMUCOSAL | Status: AC
  Administered 2021-01-15: 15 mL via OROMUCOSAL
  Filled 2021-01-15: qty 15

## 2021-01-15 MED ORDER — HYDRALAZINE HCL 20 MG/ML IJ SOLN
INTRAMUSCULAR | Status: AC
  Filled 2021-01-15: qty 1

## 2021-01-15 MED ORDER — ROCURONIUM BROMIDE 10 MG/ML (PF) SYRINGE
PREFILLED_SYRINGE | INTRAVENOUS | Status: DC | PRN
  Administered 2021-01-15: 60 mg via INTRAVENOUS
  Administered 2021-01-15: 20 mg via INTRAVENOUS

## 2021-01-15 MED ORDER — MOXIFLOXACIN HCL 400 MG PO TABS: 400.0000 mg | ORAL_TABLET | Freq: Every day | ORAL | 0 refills | Status: AC

## 2021-01-15 MED ORDER — FENTANYL CITRATE (PF) 100 MCG/2ML IJ SOLN
INTRAMUSCULAR | Status: AC
  Filled 2021-01-15: qty 4

## 2021-01-15 MED ORDER — EPHEDRINE SULFATE-NACL 50-0.9 MG/10ML-% IV SOSY
PREFILLED_SYRINGE | INTRAVENOUS | Status: DC | PRN
  Administered 2021-01-15: 5 mg via INTRAVENOUS

## 2021-01-15 MED ORDER — IOHEXOL 300 MG/ML  SOLN
75.0000 mL | Freq: Once | INTRAMUSCULAR | Status: AC | PRN
  Administered 2021-01-15: 75 mL via INTRAVENOUS

## 2021-01-15 MED ORDER — ONDANSETRON HCL 4 MG PO TABS: 4.0000 mg | ORAL_TABLET | Freq: Three times a day (TID) | ORAL | 0 refills | Status: DC | PRN

## 2021-01-15 MED ORDER — FENTANYL CITRATE (PF) 100 MCG/2ML IJ SOLN
INTRAMUSCULAR | Status: AC
  Filled 2021-01-15: qty 2

## 2021-01-15 MED ORDER — ONDANSETRON HCL 4 MG/2ML IJ SOLN
INTRAMUSCULAR | Status: DC | PRN
  Administered 2021-01-15: 4 mg via INTRAVENOUS

## 2021-01-15 MED ORDER — ONDANSETRON HCL 4 MG/2ML IJ SOLN: 4.0000 mg | Freq: Once | INTRAMUSCULAR | Status: DC | PRN

## 2021-01-15 MED ORDER — LACTATED RINGERS IV SOLN: INTRAVENOUS | Status: DC

## 2021-01-15 MED ORDER — PROPOFOL 10 MG/ML IV BOLUS
INTRAVENOUS | Status: DC | PRN
  Administered 2021-01-15: 130 mg via INTRAVENOUS

## 2021-01-15 MED ORDER — FENTANYL CITRATE (PF) 100 MCG/2ML IJ SOLN
INTRAMUSCULAR | Status: DC | PRN
  Administered 2021-01-15: 25 ug via INTRAVENOUS
  Administered 2021-01-15: 50 ug via INTRAVENOUS
  Administered 2021-01-15: 25 ug via INTRAVENOUS

## 2021-01-15 MED ORDER — LABETALOL HCL 5 MG/ML IV SOLN
5.0000 mg | Freq: Once | INTRAVENOUS | Status: AC
  Administered 2021-01-15: 5 mg via INTRAVENOUS

## 2021-01-15 MED ORDER — ALBUMIN HUMAN 5 % IV SOLN: INTRAVENOUS | Status: DC | PRN

## 2021-01-15 MED ORDER — FENTANYL CITRATE (PF) 100 MCG/2ML IJ SOLN
25.0000 ug | INTRAMUSCULAR | Status: DC | PRN
Start: 2021-01-15 — End: 2021-01-15
  Administered 2021-01-15: 50 ug via INTRAVENOUS
  Administered 2021-01-15 (×2): 25 ug via INTRAVENOUS

## 2021-01-15 MED ORDER — SODIUM CHLORIDE 0.9 % IV SOLN
2.0000 g | Freq: Once | INTRAVENOUS | Status: AC
  Administered 2021-01-15: 2 g via INTRAVENOUS
  Filled 2021-01-15 (×2): qty 2

## 2021-01-15 MED ORDER — ORAL CARE MOUTH RINSE: 15.0000 mL | Freq: Once | OROMUCOSAL | Status: AC

## 2021-01-15 MED ORDER — OXYCODONE HCL 5 MG/5ML PO SOLN: 5.0000 mg | Freq: Once | ORAL | Status: DC | PRN

## 2021-01-15 MED ORDER — PANTOPRAZOLE SODIUM 40 MG IV SOLR
40.0000 mg | Freq: Once | INTRAVENOUS | Status: AC
  Administered 2021-01-15: 40 mg via INTRAVENOUS
  Filled 2021-01-15: qty 40

## 2021-01-15 MED ORDER — KETOROLAC TROMETHAMINE 30 MG/ML IJ SOLN
30.0000 mg | Freq: Once | INTRAMUSCULAR | Status: AC
  Administered 2021-01-15: 30 mg via INTRAMUSCULAR
  Filled 2021-01-15: qty 1

## 2021-01-15 MED ORDER — LIDOCAINE 2% (20 MG/ML) 5 ML SYRINGE
INTRAMUSCULAR | Status: DC | PRN
  Administered 2021-01-15: 60 mg via INTRAVENOUS

## 2021-01-15 MED ORDER — HYDRALAZINE HCL 20 MG/ML IJ SOLN
10.0000 mg | Freq: Once | INTRAMUSCULAR | Status: AC
  Administered 2021-01-15: 10 mg via INTRAVENOUS

## 2021-01-15 MED ORDER — LABETALOL HCL 5 MG/ML IV SOLN
INTRAVENOUS | Status: AC
  Filled 2021-01-15: qty 4

## 2021-01-15 MED ORDER — PHENYLEPHRINE HCL-NACL 20-0.9 MG/250ML-% IV SOLN
INTRAVENOUS | Status: DC | PRN
  Administered 2021-01-15: 25 ug/min via INTRAVENOUS

## 2021-01-15 MED ORDER — ONDANSETRON HCL 4 MG/2ML IJ SOLN: 4.0000 mg | Freq: Four times a day (QID) | INTRAMUSCULAR | Status: DC | PRN

## 2021-01-15 MED ORDER — DOCUSATE SODIUM 100 MG PO CAPS
100.0000 mg | ORAL_CAPSULE | Freq: Two times a day (BID) | ORAL | Status: DC
  Filled 2021-01-15: qty 1

## 2021-01-15 MED ORDER — APREPITANT 40 MG PO CAPS
40.0000 mg | ORAL_CAPSULE | Freq: Once | ORAL | Status: AC
  Administered 2021-01-15: 40 mg via ORAL
  Filled 2021-01-15 (×3): qty 1

## 2021-01-15 MED ORDER — OXYCODONE HCL 5 MG PO TABS: 5.0000 mg | ORAL_TABLET | Freq: Four times a day (QID) | ORAL | 0 refills | Status: DC | PRN

## 2021-01-15 NOTE — Anesthesia Postprocedure Evaluation (Signed)
Anesthesia Post Note  Patient: Oscar [name]  Procedure(s) Performed: CT WITH ANESTHESIA MICROWAVE ABLATION OF LIVER (N/A )     Patient location during evaluation: PACU Anesthesia Type: General Level of consciousness: awake and alert Pain management: pain level controlled Vital Signs Assessment: post-procedure vital signs reviewed and stable Respiratory status: spontaneous breathing, nonlabored ventilation, respiratory function stable and patient connected to nasal cannula oxygen Cardiovascular status: blood pressure returned to baseline and stable Postop Assessment: no apparent nausea or vomiting Anesthetic complications: no   No complications documented.  Last Vitals:  Vitals:   01/15/21 1400 01/15/21 1436  BP: (!) 148/81 (!) 147/81  Pulse: (!) 56 (!) 58  Resp: 12 14  Temp: (!) 36.4 C 36.4 C  SpO2: 99% 98%    Last Pain:  Vitals:   01/15/21 1400  TempSrc:   PainSc: 3                  Tiajuana Amass

## 2021-01-15 NOTE — Anesthesia Preprocedure Evaluation (Addendum)
Anesthesia Evaluation  Patient identified by MRN, date of birth, ID band Patient awake    Reviewed: Allergy & Precautions, NPO status , Patient's Chart, lab work & pertinent test results  History of Anesthesia Complications Negative for: history of anesthetic complications  Airway Mallampati: II  TM Distance: >3 FB Neck ROM: Full    Dental  (+) Lower Dentures, Upper Dentures   Pulmonary Current Smoker and Patient abstained from smoking.,    Pulmonary exam normal        Cardiovascular negative cardio ROS Normal cardiovascular exam     Neuro/Psych negative neurological ROS  negative psych ROS   GI/Hepatic PUD,  Liver mets   Colon cancer, stage 4     Endo/Other   Pre-DM   Renal/GU negative Renal ROS     Musculoskeletal  (+) Arthritis ,   Abdominal   Peds  Hematology negative hematology ROS (+)   Anesthesia Other Findings Covid test negative   Reproductive/Obstetrics                            Anesthesia Physical Anesthesia Plan  ASA: III  Anesthesia Plan: General   Post-op Pain Management:    Induction: Intravenous  PONV Risk Score and Plan: 2 and Treatment may vary due to age or medical condition, Ondansetron, Aprepitant and Dexamethasone  Airway Management Planned: Oral ETT  Additional Equipment: None  Intra-op Plan:   Post-operative Plan: Extubation in OR  Informed Consent: I have reviewed the patients History and Physical, chart, labs and discussed the procedure including the risks, benefits and alternatives for the proposed anesthesia with the patient or authorized representative who has indicated his/her understanding and acceptance.     Dental advisory given  Plan Discussed with: CRNA and Anesthesiologist  Anesthesia Plan Comments:        Anesthesia Quick Evaluation

## 2021-01-15 NOTE — H&P (Signed)
Chief Complaint: Metastatic disease to the liver  Referring Physician(s): Cammie Sickle  Supervising Physician: Jacqulynn Cadet  Patient Status: [facility] - Out-pt  History of Present Illness: Oscar [name] is a 80 y.o. male with a past medical history of hypertension, hyperlipidemia, PUD, metastatic colon cancer, diabetes mellitus, arthritis, and current tobacco use.   He was diagnosed with metastatic colon cancer (with hepatic disease) in 02/2019.   MRI done 11/26/2020 revealed stable hepatic disease (two liver lesions- one in segment 2/3 and on in segment 6)  He consulted with Dr. Laurence Ferrari 12/05/2020 to discuss management options of his two liver lesions.   He underwent bland embolization of segment 2/3 liver lesion on 12/19/2020.  He is here today for microwave ablation of both liver lesions.  He is NPO. He feels well today, No nausea/vomiting. No Fever/chills. ROS negative.   Past Medical History:  Diagnosis Date  . Arthritis   . Cancer ([place])    colon cancer 02/2019 per pt   . Family history of adverse reaction to anesthesia    cousin took all night to wake up from anesthesia  . H/O colon cancer, stage IV   . Hyperlipemia   . Neuromuscular disorder ([place])   . Pre-diabetes     Past Surgical History:  Procedure Laterality Date  . COLON SURGERY    . ESOPHAGOGASTRODUODENOSCOPY (EGD) WITH PROPOFOL N/A 12/26/2020   Procedure: ESOPHAGOGASTRODUODENOSCOPY (EGD) WITH PROPOFOL;  Surgeon: [name], MD;  Location: [facility] ENDOSCOPY;  Service: Gastroenterology;  Laterality: N/A;  . IR ANGIOGRAM SELECTIVE EACH ADDITIONAL VESSEL  12/19/2020  . IR ANGIOGRAM VISCERAL SELECTIVE  12/19/2020  . IR EMBO TUMOR ORGAN ISCHEMIA INFARCT INC GUIDE ROADMAPPING  12/19/2020  . IR IMAGING GUIDED PORT INSERTION  07/20/2019  . IR RADIOLOGIST EVAL & MGMT  12/05/2020  . IR US GUIDE VASC ACCESS RIGHT  12/19/2020  . JOINT REPLACEMENT Left 2010   Knee    Allergies: Ace  inhibitors  Medications: Prior to Admission medications   Medication Sig Start Date End Date Taking? Authorizing Provider  nystatin (MYCOSTATIN) 100000 UNIT/ML suspension Take 5 mLs (500,000 Units total) by mouth 4 (four) times daily. 11/05/20  Yes Cammie Sickle, MD  ondansetron (ZOFRAN ODT) 8 MG disintegrating tablet Take 1 tablet (8 mg total) by mouth every 8 (eight) hours as needed for nausea or vomiting. 11/19/20  Yes Cammie Sickle, MD  ondansetron (ZOFRAN) 8 MG tablet Take 1 tablet (8 mg total) by mouth every 8 (eight) hours as needed for nausea or vomiting. 12/19/20  Yes Louk, Alexandra M, PA-C  oxyCODONE (OXY IR/ROXICODONE) 5 MG immediate release tablet Take 1 tablet (5 mg total) by mouth every 6 (six) hours as needed for severe pain. 11/21/20  Yes Borders, Kirt Boys, NP  pantoprazole (PROTONIX) 40 MG tablet Take 1 tablet (40 mg total) by mouth 2 (two) times daily. 11/27/20  Yes Cammie Sickle, MD  pravastatin (PRAVACHOL) 40 MG tablet Take 40 mg by mouth daily.    Yes [provider]  prochlorperazine (COMPAZINE) 10 MG tablet Take 1 tablet (10 mg total) by mouth every 6 (six) hours as needed for nausea or vomiting. 11/21/20  Yes Borders, Kirt Boys, NP  sucralfate (CARAFATE) 1 g tablet Take 1 tablet (1 g total) by mouth 4 (four) times daily -  with meals and at bedtime. 11/27/20  Yes Cammie Sickle, MD  tamsulosin (FLOMAX) 0.4 MG CAPS capsule Take 1 capsule (0.4 mg total) by mouth daily.  03/15/20 03/15/21 Yes Borders, Kirt Boys, NP  meloxicam (MOBIC) 15 MG tablet Take 1 tablet (15 mg total) by mouth daily. Patient not taking: Reported on 12/31/2020 09/30/20   Cammie Sickle, MD     Family History  Problem Relation Age of Onset  . Peptic Ulcer Disease Father     Social History   Socioeconomic History  . Marital status: Married    Spouse name: Not on file  . Number of children: Not on file  . Years of education: Not on file  . Highest education level: Not  on file  Occupational History  . Not on file  Tobacco Use  . Smoking status: Current Every Day Smoker    Packs/day: 0.25    Years: 15.00    Pack years: 3.75    Types: Cigarettes  . Smokeless tobacco: Never Used  . Tobacco comment: 1 to 2 cigarettes a day occasionally  Vaping Use  . Vaping Use: Never used  Substance and Sexual Activity  . Alcohol use: Yes    Comment: rarely   . Drug use: No  . Sexual activity: Not on file  Other Topics Concern  . Not on file  Social History Narrative   Recruitment consultant retd; lives in [place]; smoking 3cig/day; [3/4 ppd x started at 9 years]; no alcohol. Son & daughter; wife dementia [waiting for placement].    Social Determinants of Health   Financial Resource Strain: Not on file  Food Insecurity: Not on file  Transportation Needs: Not on file  Physical Activity: Not on file  Stress: Not on file  Social Connections: Not on file     Review of Systems: A 12 point ROS discussed and pertinent positives are indicated in the HPI above.  All other systems are negative.  Review of Systems  Vital Signs: Ht 6' (1.829 m)   Wt 81.2 kg   BMI 24.28 kg/m   BP 161/80, pulse 72, temp 97.9  Physical Exam Vitals reviewed.  Constitutional:      Appearance: Normal appearance.  HENT:     Head: Normocephalic and atraumatic.  Eyes:     Extraocular Movements: Extraocular movements intact.  Cardiovascular:     Rate and Rhythm: Normal rate and regular rhythm.  Pulmonary:     Effort: Pulmonary effort is normal. No respiratory distress.     Breath sounds: Normal breath sounds.  Abdominal:     Palpations: Abdomen is soft.  Musculoskeletal:        General: Normal range of motion.     Cervical back: Normal range of motion.  Skin:    General: Skin is warm and dry.  Neurological:     General: No focal deficit present.     Mental Status: He is alert and oriented to person, place, and time.  Psychiatric:        Mood and Affect: Mood  normal.        Behavior: Behavior normal.        Thought Content: Thought content normal.        Judgment: Judgment normal.     Imaging: DG Chest 1 View  Result Date: 01/15/2021 CLINICAL DATA:  Metastatic colon cancer.  Pre liver ablation. EXAM: CHEST  1 VIEW COMPARISON:  11/08/2020 FINDINGS: The lungs are symmetrically well expanded. Since the prior examination, an 8 mm nodule has developed within the right mid lung zone, indeterminate. No pneumothorax or pleural effusion. Cardiac size within normal limits. Right internal jugular chest port is seen with  its tip at the superior cavoatrial junction. The pulmonary vascularity is normal. No acute bone abnormality. IMPRESSION: Interval development of an 8 mm pulmonary nodule within the right mid lung zone, indeterminate. This would be better assessed with CT imaging. No radiographic evidence of acute cardiopulmonary disease. Electronically Signed   By: Fidela Salisbury MD   On: 01/15/2021 07:25   IR Angiogram Visceral Selective  Result Date: 12/19/2020 INDICATION: 80 year old male with right-sided colon cancer metastatic to the liver. He presents for planned hepatic arteriogram and bland embolization of the lesion in the left hepatic lobe as part of the stage therapy prior to microwave embolization. EXAM: IR ULTRASOUND GUIDANCE VASC ACCESS RIGHT; IR EMBO TUMOR ORGAN ISCHEMIA INFARCT INC GUIDE ROADMAPPING; ADDITIONAL ARTERIOGRAPHY; SELECTIVE VISCERAL ARTERIOGRAPHY MEDICATIONS: 2 g cefoxitin. The antibiotic was administered within 1 hour of the procedure ANESTHESIA/SEDATION: Moderate (conscious) sedation was employed during this procedure. A total of Versed 2 mg and Fentanyl 100 mcg was administered intravenously. Moderate Sedation Time: 39 minutes. The patient's level of consciousness and vital signs were monitored continuously by radiology nursing throughout the procedure under my direct supervision. CONTRAST:  17mL OMNIPAQUE IOHEXOL 300 MG/ML SOLN, 72mL  OMNIPAQUE IOHEXOL 300 MG/ML SOLN FLUOROSCOPY TIME:  Fluoroscopy Time: 5 minutes 18 seconds (763 mGy). COMPLICATIONS: None immediate. PROCEDURE: Informed consent was obtained from the patient following explanation of the procedure, risks, benefits and alternatives. The patient understands, agrees and consents for the procedure. All questions were addressed. A time out was performed prior to the initiation of the procedure. Maximal barrier sterile technique utilized including caps, mask, sterile gowns, sterile gloves, large sterile drape, hand hygiene, and Betadine prep. The right common femoral artery was interrogated with ultrasound and found to be widely patent. An image was obtained and stored for the medical record. Local anesthesia was attained by infiltration with 1% lidocaine. A small dermatotomy was made. Under real-time sonographic guidance, the vessel was punctured with a 21 gauge micropuncture needle. Using standard technique, the initial micro needle was exchanged over a 0.018 micro wire for a transitional 4 [place] micro sheath. The micro sheath was then exchanged over a 0.035 wire for a 5 French vascular sheath. A C2 cobra catheter was advanced over a Bentson wire into the abdominal aorta. The catheter was used to select the celiac axis. An initial CT arteriogram was performed. There appears to be conventional hepatic arterial anatomy. The C2 cobra catheter was advanced into the common hepatic artery over a glidewire. Additional digital subtraction arteriography was performed. The right gastric artery arises from the proximal gastroduodenal artery. The proper hepatic artery is relatively short. The left hepatic artery is easily identified. The middle hepatic artery arises from the proximal right hepatic artery. A renegade hi Flo microcatheter was then advanced coaxially through the 5 French catheter over a Fathom 16 wire. The catheter was advanced into the left hepatic artery. Arteriography was performed  in multiple projections. Microcatheter was advanced more distally in the left hepatic artery. Ultimately, contrast injections demonstrate excellent opacification of the segment 2 and segment 3 hepatic arteries. There is faint tumor blush around the peripheral aspect of the known mass which straddles segments 2 and 3. The microcatheter was secured in position. Bland embolization was then performed using approximately 50% of a vial of 40-120 micron embospheres. Embolization was taken to near stasis. Post embolization arteriography demonstrates significant pruning of the tumoral vessels. The catheter system was removed. Hemostasis was attained with the assistance of a 5 [place] Celt closure device. IMPRESSION:  Successful bland embolization of metastatic colon cancer within hepatic segments 2 and 3. Electronically Signed   By: Jacqulynn Cadet M.D.   On: 12/19/2020 13:07   IR Angiogram Selective Each Additional Vessel  Result Date: 12/19/2020 INDICATION: 80 year old male with right-sided colon cancer metastatic to the liver. He presents for planned hepatic arteriogram and bland embolization of the lesion in the left hepatic lobe as part of the stage therapy prior to microwave embolization. EXAM: IR ULTRASOUND GUIDANCE VASC ACCESS RIGHT; IR EMBO TUMOR ORGAN ISCHEMIA INFARCT INC GUIDE ROADMAPPING; ADDITIONAL ARTERIOGRAPHY; SELECTIVE VISCERAL ARTERIOGRAPHY MEDICATIONS: 2 g cefoxitin. The antibiotic was administered within 1 hour of the procedure ANESTHESIA/SEDATION: Moderate (conscious) sedation was employed during this procedure. A total of Versed 2 mg and Fentanyl 100 mcg was administered intravenously. Moderate Sedation Time: 39 minutes. The patient's level of consciousness and vital signs were monitored continuously by radiology nursing throughout the procedure under my direct supervision. CONTRAST:  69mL OMNIPAQUE IOHEXOL 300 MG/ML SOLN, 76mL OMNIPAQUE IOHEXOL 300 MG/ML SOLN FLUOROSCOPY TIME:  Fluoroscopy Time: 5  minutes 18 seconds (763 mGy). COMPLICATIONS: None immediate. PROCEDURE: Informed consent was obtained from the patient following explanation of the procedure, risks, benefits and alternatives. The patient understands, agrees and consents for the procedure. All questions were addressed. A time out was performed prior to the initiation of the procedure. Maximal barrier sterile technique utilized including caps, mask, sterile gowns, sterile gloves, large sterile drape, hand hygiene, and Betadine prep. The right common femoral artery was interrogated with ultrasound and found to be widely patent. An image was obtained and stored for the medical record. Local anesthesia was attained by infiltration with 1% lidocaine. A small dermatotomy was made. Under real-time sonographic guidance, the vessel was punctured with a 21 gauge micropuncture needle. Using standard technique, the initial micro needle was exchanged over a 0.018 micro wire for a transitional 4 [place] micro sheath. The micro sheath was then exchanged over a 0.035 wire for a 5 French vascular sheath. A C2 cobra catheter was advanced over a Bentson wire into the abdominal aorta. The catheter was used to select the celiac axis. An initial CT arteriogram was performed. There appears to be conventional hepatic arterial anatomy. The C2 cobra catheter was advanced into the common hepatic artery over a glidewire. Additional digital subtraction arteriography was performed. The right gastric artery arises from the proximal gastroduodenal artery. The proper hepatic artery is relatively short. The left hepatic artery is easily identified. The middle hepatic artery arises from the proximal right hepatic artery. A renegade hi Flo microcatheter was then advanced coaxially through the 5 French catheter over a Fathom 16 wire. The catheter was advanced into the left hepatic artery. Arteriography was performed in multiple projections. Microcatheter was advanced more distally in  the left hepatic artery. Ultimately, contrast injections demonstrate excellent opacification of the segment 2 and segment 3 hepatic arteries. There is faint tumor blush around the peripheral aspect of the known mass which straddles segments 2 and 3. The microcatheter was secured in position. Bland embolization was then performed using approximately 50% of a vial of 40-120 micron embospheres. Embolization was taken to near stasis. Post embolization arteriography demonstrates significant pruning of the tumoral vessels. The catheter system was removed. Hemostasis was attained with the assistance of a 5 [place] Celt closure device. IMPRESSION: Successful bland embolization of metastatic colon cancer within hepatic segments 2 and 3. Electronically Signed   By: Jacqulynn Cadet M.D.   On: 12/19/2020 13:07   IR US Guide  Vasc Access Right  Result Date: 12/19/2020 INDICATION: 80 year old male with right-sided colon cancer metastatic to the liver. He presents for planned hepatic arteriogram and bland embolization of the lesion in the left hepatic lobe as part of the stage therapy prior to microwave embolization. EXAM: IR ULTRASOUND GUIDANCE VASC ACCESS RIGHT; IR EMBO TUMOR ORGAN ISCHEMIA INFARCT INC GUIDE ROADMAPPING; ADDITIONAL ARTERIOGRAPHY; SELECTIVE VISCERAL ARTERIOGRAPHY MEDICATIONS: 2 g cefoxitin. The antibiotic was administered within 1 hour of the procedure ANESTHESIA/SEDATION: Moderate (conscious) sedation was employed during this procedure. A total of Versed 2 mg and Fentanyl 100 mcg was administered intravenously. Moderate Sedation Time: 39 minutes. The patient's level of consciousness and vital signs were monitored continuously by radiology nursing throughout the procedure under my direct supervision. CONTRAST:  57mL OMNIPAQUE IOHEXOL 300 MG/ML SOLN, 22mL OMNIPAQUE IOHEXOL 300 MG/ML SOLN FLUOROSCOPY TIME:  Fluoroscopy Time: 5 minutes 18 seconds (763 mGy). COMPLICATIONS: None immediate. PROCEDURE: Informed  consent was obtained from the patient following explanation of the procedure, risks, benefits and alternatives. The patient understands, agrees and consents for the procedure. All questions were addressed. A time out was performed prior to the initiation of the procedure. Maximal barrier sterile technique utilized including caps, mask, sterile gowns, sterile gloves, large sterile drape, hand hygiene, and Betadine prep. The right common femoral artery was interrogated with ultrasound and found to be widely patent. An image was obtained and stored for the medical record. Local anesthesia was attained by infiltration with 1% lidocaine. A small dermatotomy was made. Under real-time sonographic guidance, the vessel was punctured with a 21 gauge micropuncture needle. Using standard technique, the initial micro needle was exchanged over a 0.018 micro wire for a transitional 4 [place] micro sheath. The micro sheath was then exchanged over a 0.035 wire for a 5 French vascular sheath. A C2 cobra catheter was advanced over a Bentson wire into the abdominal aorta. The catheter was used to select the celiac axis. An initial CT arteriogram was performed. There appears to be conventional hepatic arterial anatomy. The C2 cobra catheter was advanced into the common hepatic artery over a glidewire. Additional digital subtraction arteriography was performed. The right gastric artery arises from the proximal gastroduodenal artery. The proper hepatic artery is relatively short. The left hepatic artery is easily identified. The middle hepatic artery arises from the proximal right hepatic artery. A renegade hi Flo microcatheter was then advanced coaxially through the 5 French catheter over a Fathom 16 wire. The catheter was advanced into the left hepatic artery. Arteriography was performed in multiple projections. Microcatheter was advanced more distally in the left hepatic artery. Ultimately, contrast injections demonstrate excellent  opacification of the segment 2 and segment 3 hepatic arteries. There is faint tumor blush around the peripheral aspect of the known mass which straddles segments 2 and 3. The microcatheter was secured in position. Bland embolization was then performed using approximately 50% of a vial of 40-120 micron embospheres. Embolization was taken to near stasis. Post embolization arteriography demonstrates significant pruning of the tumoral vessels. The catheter system was removed. Hemostasis was attained with the assistance of a 5 [place] Celt closure device. IMPRESSION: Successful bland embolization of metastatic colon cancer within hepatic segments 2 and 3. Electronically Signed   By: Jacqulynn Cadet M.D.   On: 12/19/2020 13:07   IR EMBO TUMOR ORGAN ISCHEMIA INFARCT INC GUIDE ROADMAPPING  Result Date: 12/19/2020 INDICATION: 80 year old male with right-sided colon cancer metastatic to the liver. He presents for planned hepatic arteriogram and bland embolization of the  lesion in the left hepatic lobe as part of the stage therapy prior to microwave embolization. EXAM: IR ULTRASOUND GUIDANCE VASC ACCESS RIGHT; IR EMBO TUMOR ORGAN ISCHEMIA INFARCT INC GUIDE ROADMAPPING; ADDITIONAL ARTERIOGRAPHY; SELECTIVE VISCERAL ARTERIOGRAPHY MEDICATIONS: 2 g cefoxitin. The antibiotic was administered within 1 hour of the procedure ANESTHESIA/SEDATION: Moderate (conscious) sedation was employed during this procedure. A total of Versed 2 mg and Fentanyl 100 mcg was administered intravenously. Moderate Sedation Time: 39 minutes. The patient's level of consciousness and vital signs were monitored continuously by radiology nursing throughout the procedure under my direct supervision. CONTRAST:  61mL OMNIPAQUE IOHEXOL 300 MG/ML SOLN, 75mL OMNIPAQUE IOHEXOL 300 MG/ML SOLN FLUOROSCOPY TIME:  Fluoroscopy Time: 5 minutes 18 seconds (763 mGy). COMPLICATIONS: None immediate. PROCEDURE: Informed consent was obtained from the patient following  explanation of the procedure, risks, benefits and alternatives. The patient understands, agrees and consents for the procedure. All questions were addressed. A time out was performed prior to the initiation of the procedure. Maximal barrier sterile technique utilized including caps, mask, sterile gowns, sterile gloves, large sterile drape, hand hygiene, and Betadine prep. The right common femoral artery was interrogated with ultrasound and found to be widely patent. An image was obtained and stored for the medical record. Local anesthesia was attained by infiltration with 1% lidocaine. A small dermatotomy was made. Under real-time sonographic guidance, the vessel was punctured with a 21 gauge micropuncture needle. Using standard technique, the initial micro needle was exchanged over a 0.018 micro wire for a transitional 4 [place] micro sheath. The micro sheath was then exchanged over a 0.035 wire for a 5 French vascular sheath. A C2 cobra catheter was advanced over a Bentson wire into the abdominal aorta. The catheter was used to select the celiac axis. An initial CT arteriogram was performed. There appears to be conventional hepatic arterial anatomy. The C2 cobra catheter was advanced into the common hepatic artery over a glidewire. Additional digital subtraction arteriography was performed. The right gastric artery arises from the proximal gastroduodenal artery. The proper hepatic artery is relatively short. The left hepatic artery is easily identified. The middle hepatic artery arises from the proximal right hepatic artery. A renegade hi Flo microcatheter was then advanced coaxially through the 5 French catheter over a Fathom 16 wire. The catheter was advanced into the left hepatic artery. Arteriography was performed in multiple projections. Microcatheter was advanced more distally in the left hepatic artery. Ultimately, contrast injections demonstrate excellent opacification of the segment 2 and segment 3 hepatic  arteries. There is faint tumor blush around the peripheral aspect of the known mass which straddles segments 2 and 3. The microcatheter was secured in position. Bland embolization was then performed using approximately 50% of a vial of 40-120 micron embospheres. Embolization was taken to near stasis. Post embolization arteriography demonstrates significant pruning of the tumoral vessels. The catheter system was removed. Hemostasis was attained with the assistance of a 5 [place] Celt closure device. IMPRESSION: Successful bland embolization of metastatic colon cancer within hepatic segments 2 and 3. Electronically Signed   By: Jacqulynn Cadet M.D.   On: 12/19/2020 13:07    Labs:  CBC: Recent Labs    12/19/20 0954 12/31/20 0928 01/06/21 1340 01/15/21 0739  WBC 4.4 4.9 6.1 5.2  HGB 12.4* 11.4* 12.7* 12.5*  HCT 38.2* 34.2* 40.0 38.1*  PLT 127* 183 203 150    COAGS: Recent Labs    12/19/20 0954  INR 1.0    BMP: Recent Labs  06/10/20 6237 06/25/20 6283 07/08/20 1517 07/22/20 0836 08/05/20 0821 12/19/20 0954 12/31/20 0928 01/06/21 1340 01/15/21 0739  NA 138 138 139 138   < > 140 139 140 139  K 3.7 3.6 3.6 3.6   < > 3.9 3.6 4.3 4.0  CL 107 108 108 107   < > 107 106 106 106  CO2 25 24 25 25    < > 24 26 26 25   GLUCOSE 205* 201* 209* 195*   < > 122* 168* 96 117*  BUN 11 10 9 10    < > 9 8 11 12   CALCIUM 8.2* 8.0* 8.0* 8.1*   < > 8.5* 8.1* 8.8* 8.6*  CREATININE 0.89 0.80 0.70 0.89   < > 0.69 0.77 0.64 0.82  GFRNONAA >60 >60 >60 >60   < > >60 >60 >60 >60  GFRAA >60 >60 >60 >60  --   --   --   --   --    < > = values in this interval not displayed.    LIVER FUNCTION TESTS: Recent Labs    12/03/20 0859 12/19/20 0954 12/31/20 0928 01/15/21 0739  BILITOT 0.8 1.2 0.8 0.7  AST 35 34 28 43*  ALT 19 20 17 25   ALKPHOS 109 118 118 176*  PROT 6.1* 6.9 6.6 7.7  ALBUMIN 2.3* 3.0* 2.7* 3.5    TUMOR MARKERS: No results for input(s): AFPTM, CEA, CA199, CHROMGRNA in the last  8760 hours.  Assessment and Plan:  History of metastatic colon cancer with two liver lesions (one in segment 2/3 and on in segment 6).  S/P mesenteric arteriogram with bland embolization of liver lesion (one in segment 2/3) on 12/19/2020.  Will proceed with with hepatic angiography and microwave ablation of metastatic liver lesion(s) today by Dr. Laurence Ferrari.  Risks and benefits discussed with the patient including, but not limited to bleeding, infection, liver failure, bile duct injury, pneumothorax or damage to adjacent structures.  This procedure involves the use of X-rays and because of the nature of the planned procedure, it is possible that we will have prolonged use of X-ray fluoroscopy. Potential radiation risks to you include (but are not limited to) the following: - A slightly elevated risk for cancer several years later in life. This risk is typically less than 0.5% percent. This risk is low in comparison to the normal incidence of human cancer, which is 33% for women and 50% for men according to the [employer]. - Radiation induced injury can include skin redness, resembling a rash, tissue breakdown / ulcers and hair loss (which can be temporary or permanent). The likelihood of either of these occurring depends on the difficulty of the procedure and whether you are sensitive to radiation due to previous procedures, disease, or genetic conditions. IF your procedure requires a prolonged use of radiation, you will be notified and given written instructions for further action. It is your responsibility to monitor the irradiated area for the 2 weeks following the procedure and to notify your physician if you are concerned that you have suffered a radiation induced injury.  All of the patient's questions were answered, patient is agreeable to proceed. Consent signed and in chart.  All of the patient's questions were answered, patient is agreeable to proceed. Consent signed  and in chart.  Electronically Signed: Murrell Redden, PA-C   01/15/2021, 8:23 AM      I spent a total of    15 Minutes in face to face in clinical consultation,  greater than 50% of which was counseling/coordinating care for microwave ablation of liver lesion.

## 2021-01-15 NOTE — Transfer of Care (Signed)
Immediate Anesthesia Transfer of Care Note  Patient: Oscar [name]  Procedure(s) Performed: CT WITH ANESTHESIA MICROWAVE ABLATION OF LIVER (N/A )  Patient Location: PACU  Anesthesia Type:General  Level of Consciousness: awake, alert , oriented and patient cooperative  Airway & Oxygen Therapy: Patient Spontanous Breathing and Patient connected to face mask oxygen  Post-op Assessment: Report given to RN, Post -op Vital signs reviewed and stable and Patient moving all extremities  Post vital signs: Reviewed and stable  Last Vitals:  Vitals Value Taken Time  BP 180/83 01/15/21 1102  Temp    Pulse 71 01/15/21 1104  Resp 14 01/15/21 1104  SpO2 100 % 01/15/21 1104  Vitals shown include unvalidated device data.  Last Pain:  Vitals:   01/15/21 0701  TempSrc: Oral         Complications: No complications documented.

## 2021-01-15 NOTE — Anesthesia Procedure Notes (Addendum)

## 2021-01-15 NOTE — Procedures (Signed)
Interventional Radiology Procedure Note  Procedure: CT guided ablation of colon cancer metastatic to the liver.   Complications: None immediate.   Estimated Blood Loss: None.  Recommendations: - Admit for obs, possible DC today - Pain control, ADAT   Signed,  Criselda Peaches, MD

## 2021-01-15 NOTE — Discharge Summary (Signed)
Patient ID: Oscar [name] MRN: [id number] DOB/AGE: [birth year] 79 y.o.  Admit date: 01/15/2021 Discharge date: 01/15/2021  Supervising Physician: Jacqulynn Cadet  Patient Status: [facility] - In-pt  Admission Diagnoses: Metastatic disease to the liver  Discharge Diagnoses:  Active Problems:   Colon cancer metastasized to liver [facility])   Discharged Condition: good  Hospital Course:   Oscar [name] is a 80 y.o. male with a past medical history of hypertension, hyperlipidemia, PUD, metastatic colon cancer, diabetes mellitus, arthritis, and current tobacco use.   He was diagnosed with metastatic colon cancer (with hepatic disease) in 02/2019.   MRI done 11/26/2020 revealed stable hepatic disease (two liver lesions- one in segment 2/3 and on in segment 6)  He consulted with Dr. Laurence Ferrari 12/05/2020 to discuss management options of his two liver lesions.   He underwent bland embolization of segment 2/3 liver lesion on 12/19/2020.  He is here today for microwave ablation of both liver lesions.  He did very well post-procedure. He is awake and alert. Tolerating a diet.  He is stable for discharge home this afternoon.  Consults: None  Treatments: CT guided ablation of colon cancer metastatic to the liver.   Discharge Exam: Blood pressure (!) 147/81, pulse (!) 58, temperature 97.6 F (36.4 C), resp. rate 14, height 6' (1.829 m), weight 85 kg, SpO2 98 %. Awake alert Doing well Heart RRR No respiratory issues No bleeding Pain controlled No N/V  Disposition: Discharge disposition: 01-Home or Self Care     Our office will call patient with follow up  Instructions/appointment date and time.  Discharge Instructions    Call MD for:  persistant nausea and vomiting   Complete by: As directed    Call MD for:  redness, tenderness, or signs of infection (pain, swelling, redness, odor or green/yellow discharge around incision site)   Complete by: As directed    Call MD  for:  severe uncontrolled pain   Complete by: As directed    Call MD for:  temperature >100.4   Complete by: As directed    Increase activity slowly   Complete by: As directed    Remove dressing in 24 hours   Complete by: As directed      Allergies as of 01/15/2021      Reactions   Ace Inhibitors Swelling      Medication List    TAKE these medications   meloxicam 15 MG tablet Commonly known as: MOBIC Take 1 tablet (15 mg total) by mouth daily.   moxifloxacin 400 MG tablet Commonly known as: Avelox Take 1 tablet (400 mg total) by mouth daily at 8 pm for 7 days.   nystatin 100000 UNIT/ML suspension Commonly known as: MYCOSTATIN Take 5 mLs (500,000 Units total) by mouth 4 (four) times daily.   ondansetron 8 MG disintegrating tablet Commonly known as: Zofran ODT Take 1 tablet (8 mg total) by mouth every 8 (eight) hours as needed for nausea or vomiting.   ondansetron 8 MG tablet Commonly known as: Zofran Take 1 tablet (8 mg total) by mouth every 8 (eight) hours as needed for nausea or vomiting. What changed: Another medication with the same name was added. Make sure you understand how and when to take each.   ondansetron 4 MG tablet Commonly known as: Zofran Take 1 tablet (4 mg total) by mouth every 8 (eight) hours as needed for nausea or vomiting. What changed: You were already taking a medication with the same name, and this prescription  was added. Make sure you understand how and when to take each.   oxyCODONE 5 MG immediate release tablet Commonly known as: Oxy IR/ROXICODONE Take 1 tablet (5 mg total) by mouth every 6 (six) hours as needed for severe pain. What changed: Another medication with the same name was added. Make sure you understand how and when to take each.   oxyCODONE 5 MG immediate release tablet Commonly known as: Roxicodone Take 1 tablet (5 mg total) by mouth every 6 (six) hours as needed for up to 10 doses for severe pain. What changed: You were  already taking a medication with the same name, and this prescription was added. Make sure you understand how and when to take each.   pantoprazole 40 MG tablet Commonly known as: Protonix Take 1 tablet (40 mg total) by mouth 2 (two) times daily.   pravastatin 40 MG tablet Commonly known as: PRAVACHOL Take 40 mg by mouth daily.   prochlorperazine 10 MG tablet Commonly known as: COMPAZINE Take 1 tablet (10 mg total) by mouth every 6 (six) hours as needed for nausea or vomiting.   sucralfate 1 g tablet Commonly known as: Carafate Take 1 tablet (1 g total) by mouth 4 (four) times daily -  with meals and at bedtime.   tamsulosin 0.4 MG Caps capsule Commonly known as: FLOMAX Take 1 capsule (0.4 mg total) by mouth daily.         Electronically Signed: Murrell Redden, PA-C 01/15/2021, 3:56 PM

## 2021-01-15 NOTE — Interval H&P Note (Signed)
Anesthesia H&P Update: History and Physical Exam reviewed; patient is OK for planned anesthetic and procedure. ? ?

## 2021-01-15 NOTE — Sedation Documentation (Signed)
Anesthesia in to sedate and monitor. 

## 2021-01-15 NOTE — Plan of Care (Signed)

## 2021-01-16 ENCOUNTER — Encounter (HOSPITAL_COMMUNITY): Payer: Self-pay | Admitting: Interventional Radiology

## 2021-01-27 ENCOUNTER — Inpatient Hospital Stay: Payer: Medicare HMO | Attending: Hospice and Palliative Medicine | Admitting: Hospice and Palliative Medicine

## 2021-01-27 ENCOUNTER — Other Ambulatory Visit: Payer: Self-pay

## 2021-01-27 DIAGNOSIS — C182 Malignant neoplasm of ascending colon: Secondary | ICD-10-CM

## 2021-01-27 DIAGNOSIS — F17210 Nicotine dependence, cigarettes, uncomplicated: Secondary | ICD-10-CM | POA: Insufficient documentation

## 2021-01-27 DIAGNOSIS — M25511 Pain in right shoulder: Secondary | ICD-10-CM | POA: Insufficient documentation

## 2021-01-27 DIAGNOSIS — G620 Drug-induced polyneuropathy: Secondary | ICD-10-CM | POA: Insufficient documentation

## 2021-01-27 DIAGNOSIS — Z515 Encounter for palliative care: Secondary | ICD-10-CM

## 2021-01-27 DIAGNOSIS — C787 Secondary malignant neoplasm of liver and intrahepatic bile duct: Secondary | ICD-10-CM | POA: Insufficient documentation

## 2021-01-27 MED ORDER — TAMSULOSIN HCL 0.4 MG PO CAPS: 0.4000 mg | ORAL_CAPSULE | Freq: Every day | ORAL | 11 refills | Status: DC

## 2021-01-27 MED ORDER — OXYCODONE HCL 5 MG PO TABS: 5.0000 mg | ORAL_TABLET | Freq: Four times a day (QID) | ORAL | 0 refills | Status: DC | PRN

## 2021-01-27 NOTE — Progress Notes (Signed)
Virtual Visit via Telephone Note  I connected with Oscar [name] on 01/27/21 at  9:30 AM EDT by telephone and verified that I am speaking with the correct person using two identifiers.  Location: Patient: Home Provider: Clinic   I discussed the limitations, risks, security and privacy concerns of performing an evaluation and management service by telephone and the availability of in person appointments. I also discussed with the patient that there may be a patient responsible charge related to this service. The patient expressed understanding and agreed to proceed.   History of Present Illness: [name] is a 80 y.o. male with multiple medical problems including stage IV colon cancer on FOLFIRI plus Avastin chemotherapy.  Patient was referred to palliative care to help address goals and manage ongoing symptoms.   Observations/Objective: Patient underwent embolization to liver lesions on 01/15/2021.  He appears to have tolerated it well.  Patient reports that he is doing well at home.  He denies any significant changes or concerns.  He requests something other than meloxicam for management of arthritic pain.  He was taken off meloxicam due to concerns for gastric ulcers.  Will refill oxycodone.  Patient also requests refill of Flomax.  Patient reports stable performance status.  His appetite is reportedly good.  Weights are stable.  Assessment and Plan: Metastatic colon cancer -status post recent liver embolization.  Symptomatically appears to be doing well.  He has follow-up tomorrow with Dr. Rogue Bussing.  Neoplasm related pain -refill oxycodone, #60. PDMP reviewed.   Follow Up Instructions: Follow-up MyChart visit 1 to 2 months   I discussed the assessment and treatment plan with the patient. The patient was provided an opportunity to ask questions and all were answered. The patient agreed with the plan and demonstrated an understanding of the instructions.   The patient was  advised to call back or seek an in-person evaluation if the symptoms worsen or if the condition fails to improve as anticipated.  I provided 10 minutes of non-face-to-face time during this encounter.   [name], NP

## 2021-01-28 ENCOUNTER — Inpatient Hospital Stay: Payer: Medicare HMO

## 2021-01-28 ENCOUNTER — Other Ambulatory Visit: Payer: Self-pay

## 2021-01-28 ENCOUNTER — Inpatient Hospital Stay: Payer: Medicare HMO | Admitting: Internal Medicine

## 2021-01-28 DIAGNOSIS — F17210 Nicotine dependence, cigarettes, uncomplicated: Secondary | ICD-10-CM | POA: Diagnosis not present

## 2021-01-28 DIAGNOSIS — M25511 Pain in right shoulder: Secondary | ICD-10-CM | POA: Diagnosis not present

## 2021-01-28 DIAGNOSIS — C182 Malignant neoplasm of ascending colon: Secondary | ICD-10-CM

## 2021-01-28 DIAGNOSIS — G620 Drug-induced polyneuropathy: Secondary | ICD-10-CM | POA: Diagnosis not present

## 2021-01-28 DIAGNOSIS — C787 Secondary malignant neoplasm of liver and intrahepatic bile duct: Secondary | ICD-10-CM | POA: Diagnosis not present

## 2021-01-28 LAB — COMPREHENSIVE METABOLIC PANEL
ALT: 19 U/L (ref 0–44)
AST: 30 U/L (ref 15–41)
Albumin: 3.3 g/dL — ABNORMAL LOW (ref 3.5–5.0)
Alkaline Phosphatase: 119 U/L (ref 38–126)
Anion gap: 7 (ref 5–15)
BUN: 11 mg/dL (ref 8–23)
CO2: 27 mmol/L (ref 22–32)
Calcium: 8.6 mg/dL — ABNORMAL LOW (ref 8.9–10.3)
Chloride: 105 mmol/L (ref 98–111)
Creatinine, Ser: 0.75 mg/dL (ref 0.61–1.24)
GFR, Estimated: >60 mL/min (ref >60)
Glucose, Bld: 119 mg/dL — ABNORMAL HIGH (ref 70–99)
Potassium: 3.8 mmol/L (ref 3.5–5.1)
Sodium: 139 mmol/L (ref 135–145)
Total Bilirubin: 0.5 mg/dL (ref 0.3–1.2)
Total Protein: 7 g/dL (ref 6.5–8.1)

## 2021-01-28 LAB — CBC WITH DIFFERENTIAL/PLATELET
Abs Immature Granulocytes: 0.01 10*3/uL (ref 0.00–0.07)
Basophils Absolute: 0.1 10*3/uL (ref 0.0–0.1)
Basophils Relative: 1 %
Eosinophils Absolute: 0.5 10*3/uL (ref 0.0–0.5)
Eosinophils Relative: 10 %
HCT: 35.5 % — ABNORMAL LOW (ref 39.0–52.0)
Hemoglobin: 11.8 g/dL — ABNORMAL LOW (ref 13.0–17.0)
Immature Granulocytes: 0 %
Lymphocytes Relative: 22 %
Lymphs Abs: 1.2 10*3/uL (ref 0.7–4.0)
MCH: 32.6 pg (ref 26.0–34.0)
MCHC: 33.2 g/dL (ref 30.0–36.0)
MCV: 98.1 fL (ref 80.0–100.0)
Monocytes Absolute: 0.4 10*3/uL (ref 0.1–1.0)
Monocytes Relative: 7 %
Neutro Abs: 3.1 10*3/uL (ref 1.7–7.7)
Neutrophils Relative %: 60 %
Platelets: 163 10*3/uL (ref 150–400)
RBC: 3.62 MIL/uL — ABNORMAL LOW (ref 4.22–5.81)
RDW: 13.6 % (ref 11.5–15.5)
WBC: 5.3 10*3/uL (ref 4.0–10.5)
nRBC: 0 % (ref 0.0–0.2)

## 2021-01-28 MED ORDER — HEPARIN SOD (PORK) LOCK FLUSH 100 UNIT/ML IV SOLN
500.0000 [IU] | Freq: Once | INTRAVENOUS | Status: AC
Start: 2021-01-28 — End: 2021-01-28
  Administered 2021-01-28: 500 [IU] via INTRAVENOUS
  Filled 2021-01-28: qty 5

## 2021-01-28 MED ORDER — SODIUM CHLORIDE 0.9% FLUSH
10.0000 mL | INTRAVENOUS | Status: DC | PRN
  Administered 2021-01-28: 10 mL via INTRAVENOUS
  Filled 2021-01-28: qty 10

## 2021-01-28 NOTE — Progress Notes (Signed)
Port accessed, port labs drawn, patient seen MD, deaccessed. Discharged and stable.

## 2021-01-28 NOTE — Assessment & Plan Note (Addendum)
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 Currently on FOLFIRI plus Avastin on HOLD; MRI FEB 2022- over stable liver disease disease; but peptic ulcer [see below]  # HOLD further chemo [sec to poor tolerance]; currently s/p liver ablation on 3/10; s/p micorwave ablation  On 3/30;  Recommend imaging in 2 to 3 months posttreatment. will discuss with IR regarding timing of imaging.  Continue to hold systemic chemotherapy at this time.  If progressive disease noted would recommend- TAS 102 vs. Regorafenib.   # Peptic ulcer disease/duodenal ulcer-NSAID related.  S/p EGD on 2/16-continue PPI.  # PN- G-1-2; sec to oxaliplatin-stable  # Mediport- port flush q 2-3 months  # Shoulder pain-refill oxycodone Silver.[place]. ]  # DISPOSITION: de-access # follow up in 2 month- MD; labs- cbc/cmp CEA/port flush- Dr.B

## 2021-01-28 NOTE — Progress Notes (Signed)
Patient here for oncology follow-up appointment, expresses concerns of pain med refill

## 2021-01-28 NOTE — Progress Notes (Signed)
[place] NOTE  Patient Care Team: Sofie Hartigan, MD as PCP - General (Family Medicine) Cammie Sickle, MD as Consulting Physician (Hematology and Oncology)  CHIEF COMPLAINTS/PURPOSE OF CONSULTATION: Colon cancer  #  Oncology History Overview Note  # MAY 2020- 3. 03/09/19 Liver biopsy. Microscopic examination shows malignant cells with glandular architecture consistent with adenocarcinoma. The malignant cells are positive for CK20 and CDX-2. These findings support the clinical impression of metastatic colon adenocarcinoma. 4. 03/10/19 R hemicolectomy. Tumor site cecum. Adenocarcinoma. Mucinous features present. G2. No tumor deposits. Invades visceral peritoneum. No tumor perforation. LVI present. PNI not identified. All margins uninvolved. 1/12 LNs. PT4apN1. Periappendiceal inflammation c/w resolving abscess. Microsatellite stable (MSS). [Dr.Mettu; [facility]]  # SEP 4th 2020 [compared to May 2020]  Interval increase in size of the metastases to the hepatic dome, The metastasis to the left hepatic lobe is unchanged; 2.  New subcentimeter hypoattenuating lesion in the inferior right hepatic lobe, incompletely characterized on CT. 3.  Postsurgical changes following right hemicolectomy.  # OCT 2020- FOLFOX +avastin; CT dec 22nd 2020- [compared to [facility] sep 9th 2020]-Liver- slight progression versus stable disease; CT scan SEP 4th 2021- Progressive disease-left lower lobe lung nodule 8 mm [previously 4 mm]; increase in size of the hepatic metastases by few millimeters.;  Soft tissue nodule adjacent to anastomotic site again increased by few millimeters.STOP FOLFOX; cont avastin  #  SEP 20th, 2021- FOLFIRI+ AVASTIN; HOLD FEB 2022- sec to poor toelrance [peptic ulcer]; 3/30-liver ablation-   [bland embolization of segment 2/3 liver lesion on 12/19/2020;  microwave ablation of both liver lesions.  # FEB /16-EGD [Dr.Anna-duodenal ulcer-? meloxicam]  # JAN 2022- COVID [s/p Mab  infusion; pills; skin rash resolved.]  # NGS/F-ONE-MUTATED K-RAS [G]  # PALLIATIVE CARE EVALUATION: 09/20/2019-[name]  # PAIN MANAGEMENT: NA   DIAGNOSIS: COLON CANCER  STAGE:  IV     ;  GOALS:Palliative  CURRENT/MOST RECENT THERAPY : FOLFIRI+ avastin [C]    Cancer of right colon ([place])  07/05/2019 Initial Diagnosis   Cancer of right colon ([place])   07/24/2019 - 06/25/2020 Chemotherapy   The patient had dexamethasone (DECADRON) 4 MG tablet, 8 mg, Oral, Daily, 1 of 1 cycle, Start date: --, End date: -- palonosetron (ALOXI) injection 0.25 mg, 0.25 mg, Intravenous,  Once, 23 of 25 cycles Administration: 0.25 mg (07/24/2019), 0.25 mg (08/07/2019), 0.25 mg (08/21/2019), 0.25 mg (09/04/2019), 0.25 mg (09/20/2019), 0.25 mg (10/04/2019), 0.25 mg (10/16/2019), 0.25 mg (10/30/2019), 0.25 mg (11/22/2019), 0.25 mg (12/06/2019), 0.25 mg (12/20/2019), 0.25 mg (01/03/2020), 0.25 mg (01/29/2020), 0.25 mg (02/12/2020), 0.25 mg (02/26/2020), 0.25 mg (03/11/2020), 0.25 mg (03/25/2020), 0.25 mg (04/08/2020), 0.25 mg (04/24/2020), 0.25 mg (05/08/2020), 0.25 mg (05/22/2020), 0.25 mg (06/10/2020), 0.25 mg (06/25/2020) leucovorin 800 mg in dextrose 5 % 250 mL infusion, 844 mg, Intravenous,  Once, 23 of 25 cycles Administration: 800 mg (07/24/2019), 800 mg (08/07/2019), 800 mg (08/21/2019), 800 mg (09/04/2019), 800 mg (09/20/2019), 800 mg (10/04/2019), 800 mg (10/16/2019), 800 mg (10/30/2019), 800 mg (11/22/2019), 800 mg (12/06/2019), 800 mg (12/20/2019), 800 mg (01/03/2020), 800 mg (01/29/2020), 800 mg (02/12/2020), 800 mg (02/26/2020), 800 mg (03/11/2020), 800 mg (03/25/2020), 800 mg (04/08/2020), 800 mg (04/24/2020), 800 mg (05/08/2020), 800 mg (05/22/2020), 800 mg (06/10/2020), 800 mg (06/25/2020) oxaliplatin (ELOXATIN) 180 mg in dextrose 5 % 500 mL chemo infusion, 85 mg/m2 = 180 mg, Intravenous,  Once, 23 of 25 cycles Dose modification: 178 mg (original dose 85 mg/m2, Cycle 17, Reason: Other (see comments), Comment: insurance adjusted  dose ) Administration: 180 mg  (07/24/2019), 180 mg (08/07/2019), 180 mg (08/21/2019), 180 mg (09/04/2019), 180 mg (09/20/2019), 180 mg (10/04/2019), 180 mg (10/16/2019), 180 mg (10/30/2019), 180 mg (11/22/2019), 180 mg (12/06/2019), 180 mg (12/20/2019), 180 mg (01/03/2020), 180 mg (01/29/2020), 180 mg (02/12/2020), 180 mg (02/26/2020), 180 mg (03/11/2020), 180 mg (04/08/2020), 180 mg (04/24/2020), 180 mg (05/08/2020), 180 mg (05/22/2020), 180 mg (06/10/2020), 180 mg (06/25/2020) fluorouracil (ADRUCIL) 5,000 mg in sodium chloride 0.9 % 150 mL chemo infusion, 5,050 mg, Intravenous, 1 Day/Dose, 23 of 25 cycles Administration: 5,000 mg (07/24/2019), 5,000 mg (08/07/2019), 5,000 mg (08/21/2019), 5,050 mg (09/04/2019), 5,000 mg (10/04/2019), 5,000 mg (10/16/2019), 5,000 mg (11/22/2019), 5,000 mg (12/06/2019), 5,000 mg (12/20/2019), 5,000 mg (01/03/2020), 5,000 mg (01/29/2020), 5,000 mg (02/12/2020), 5,000 mg (02/26/2020), 5,000 mg (03/11/2020), 5,000 mg (03/25/2020), 5,000 mg (04/08/2020), 5,000 mg (04/24/2020), 5,000 mg (05/08/2020), 5,000 mg (05/22/2020), 5,000 mg (06/10/2020), 5,000 mg (06/25/2020) bevacizumab-bvzr (ZIRABEV) 400 mg in sodium chloride 0.9 % 100 mL chemo infusion, 5 mg/kg = 400 mg, Intravenous,  Once, 23 of 25 cycles Administration: 400 mg (08/07/2019), 400 mg (08/21/2019), 400 mg (09/04/2019), 400 mg (09/20/2019), 400 mg (10/04/2019), 400 mg (10/16/2019), 400 mg (10/30/2019), 400 mg (11/22/2019), 400 mg (12/06/2019), 400 mg (12/20/2019), 400 mg (01/03/2020), 400 mg (01/29/2020), 400 mg (02/12/2020), 400 mg (02/26/2020), 400 mg (03/11/2020), 400 mg (03/25/2020), 400 mg (04/08/2020), 400 mg (04/24/2020), 400 mg (05/08/2020), 400 mg (05/22/2020), 400 mg (06/10/2020), 400 mg (06/25/2020)  for chemotherapy treatment.    07/08/2020 -  Chemotherapy    Patient is on Treatment Plan: COLORECTAL FOLFIRI / BEVACIZUMAB Q14D      11/05/2020 Cancer Staging   Staging form: Colon and Rectum, AJCC 8th Edition - Clinical: Stage IVC (pM1c) - Signed by Cammie Sickle, MD on 11/05/2020     HISTORY OF  PRESENTING ILLNESS:  Oscar [name] 80 y.o.  male with metastatic colon cancer to the liver most recently on FOLFIRI plus Avastin chemotherapy-is here for follow-up.  Chemotherapy is currently on hold because of poor tolerance/currently s/p IR ablation of the liver lesion.  Patient had microwave ablation repeat on March 30.  Patient has had no complications with procedure.  Overall is improved after stopping chemotherapy.  Continues to have right shoulder pain needing narcotics.  Otherwise no nausea no vomiting.  No abdominal pain.  Review of Systems  Constitutional: Positive for malaise/fatigue. Negative for chills, diaphoresis and fever.  HENT: Negative for nosebleeds and sore throat.   Eyes: Negative for double vision.  Respiratory: Negative for cough, hemoptysis, sputum production, shortness of breath and wheezing.   Cardiovascular: Negative for chest pain, palpitations, orthopnea and leg swelling.  Gastrointestinal: Negative for blood in stool, constipation, heartburn, melena, nausea and vomiting.  Genitourinary: Negative for dysuria, frequency and urgency.  Musculoskeletal: Positive for back pain and joint pain.  Skin: Negative.  Negative for itching and rash.  Neurological: Positive for tingling. Negative for focal weakness, weakness and headaches.  Endo/Heme/Allergies: Does not bruise/bleed easily.  Psychiatric/Behavioral: Negative for depression. The patient is not nervous/anxious and does not have insomnia.      MEDICAL HISTORY:  Past Medical History:  Diagnosis Date  . Arthritis   . Cancer ([place])    colon cancer 02/2019 per pt   . Family history of adverse reaction to anesthesia    cousin took all night to wake up from anesthesia  . H/O colon cancer, stage IV   . Hyperlipemia   . Neuromuscular disorder ([place])   . Pre-diabetes  SURGICAL HISTORY: Past Surgical History:  Procedure Laterality Date  . COLON SURGERY    . ESOPHAGOGASTRODUODENOSCOPY (EGD) WITH PROPOFOL  N/A 12/26/2020   Procedure: ESOPHAGOGASTRODUODENOSCOPY (EGD) WITH PROPOFOL;  Surgeon: [name], MD;  Location: [facility] ENDOSCOPY;  Service: Gastroenterology;  Laterality: N/A;  . IR ANGIOGRAM SELECTIVE EACH ADDITIONAL VESSEL  12/19/2020  . IR ANGIOGRAM VISCERAL SELECTIVE  12/19/2020  . IR EMBO TUMOR ORGAN ISCHEMIA INFARCT INC GUIDE ROADMAPPING  12/19/2020  . IR IMAGING GUIDED PORT INSERTION  07/20/2019  . IR RADIOLOGIST EVAL & MGMT  12/05/2020  . IR US GUIDE VASC ACCESS RIGHT  12/19/2020  . JOINT REPLACEMENT Left 2010   Knee  . RADIOLOGY WITH ANESTHESIA N/A 01/15/2021   Procedure: CT WITH ANESTHESIA MICROWAVE ABLATION OF LIVER;  Surgeon: Criselda Peaches, MD;  Location: [facility] ORS;  Service: Anesthesiology;  Laterality: N/A;    SOCIAL HISTORY: Social History   Socioeconomic History  . Marital status: Married    Spouse name: Not on file  . Number of children: Not on file  . Years of education: Not on file  . Highest education level: Not on file  Occupational History  . Not on file  Tobacco Use  . Smoking status: Current Every Day Smoker    Packs/day: 0.25    Years: 15.00    Pack years: 3.75    Types: Cigarettes  . Smokeless tobacco: Never Used  . Tobacco comment: 1 to 2 cigarettes a day occasionally  Vaping Use  . Vaping Use: Never used  Substance and Sexual Activity  . Alcohol use: Yes    Comment: rarely   . Drug use: No  . Sexual activity: Not on file  Other Topics Concern  . Not on file  Social History Narrative   Recruitment consultant retd; lives in [place]; smoking 3cig/day; [3/4 ppd x started at 9 years]; no alcohol. Son & daughter; wife dementia [waiting for placement].    Social Determinants of Health   Financial Resource Strain: Not on file  Food Insecurity: Not on file  Transportation Needs: Not on file  Physical Activity: Not on file  Stress: Not on file  Social Connections: Not on file  Intimate Partner Violence: Not on file    FAMILY HISTORY: Family  History  Problem Relation Age of Onset  . Peptic Ulcer Disease Father     ALLERGIES:  is allergic to ace inhibitors.  MEDICATIONS:  Current Outpatient Medications  Medication Sig Dispense Refill  . Nirmatrelvir & Ritonavir 20 x 150 MG & 10 x 100MG TBPK TAKE 3 TABLETS AS DIRECTED BY MOUTH 2 TIMES DAILY FOR 5 DAYS. (Patient not taking: Reported on 01/28/2021) 30 each 0  . nystatin (MYCOSTATIN) 100000 UNIT/ML suspension Take 5 mLs (500,000 Units total) by mouth 4 (four) times daily. (Patient not taking: Reported on 01/28/2021) 60 mL 0  . ondansetron (ZOFRAN ODT) 8 MG disintegrating tablet Take 1 tablet (8 mg total) by mouth every 8 (eight) hours as needed for nausea or vomiting. 45 tablet 0  . ondansetron (ZOFRAN) 4 MG tablet Take 1 tablet (4 mg total) by mouth every 8 (eight) hours as needed for nausea or vomiting. 20 tablet 0  . ondansetron (ZOFRAN) 8 MG tablet Take 1 tablet (8 mg total) by mouth every 8 (eight) hours as needed for nausea or vomiting. 20 tablet 0  . oxyCODONE (OXY IR/ROXICODONE) 5 MG immediate release tablet Take 1 tablet (5 mg total) by mouth every 6 (six) hours as needed for severe pain. [place]  tablet 0  . pantoprazole (PROTONIX) 40 MG tablet Take 1 tablet (40 mg total) by mouth 2 (two) times daily. (Patient not taking: Reported on 01/28/2021) 60 tablet 3  . pravastatin (PRAVACHOL) 40 MG tablet Take 40 mg by mouth daily.     . prochlorperazine (COMPAZINE) 10 MG tablet Take 1 tablet (10 mg total) by mouth every 6 (six) hours as needed for nausea or vomiting. (Patient not taking: Reported on 01/28/2021) 60 tablet 0  . sucralfate (CARAFATE) 1 g tablet Take 1 tablet (1 g total) by mouth 4 (four) times daily -  with meals and at bedtime. 90 tablet 3  . tamsulosin (FLOMAX) 0.4 MG CAPS capsule Take 1 capsule (0.4 mg total) by mouth daily. 30 capsule 11   No current facility-administered medications for this visit.   Facility-Administered Medications Ordered in Other Visits  Medication  Dose Route Frequency Provider Last Rate Last Admin  . sodium chloride flush (NS) 0.9 % injection 10 mL  10 mL Intravenous PRN Cammie Sickle, MD   10 mL at 01/28/21 1013      .  PHYSICAL EXAMINATION: ECOG PERFORMANCE STATUS: 0 - Asymptomatic  There were no vitals filed for this visit. There were no vitals filed for this visit.  Physical Exam HENT:     Head: Normocephalic and atraumatic.     Mouth/Throat:     Pharynx: No oropharyngeal exudate.     Comments: Whitish patches noted right side.  Eyes:     Pupils: Pupils are equal, round, and reactive to light.  Cardiovascular:     Rate and Rhythm: Normal rate and regular rhythm.  Pulmonary:     Effort: No respiratory distress.     Breath sounds: No wheezing.     Comments: Decreased breath sounds bilaterally.  No wheeze or crackles Abdominal:     General: Bowel sounds are normal. There is no distension.     Palpations: Abdomen is soft. There is no mass.     Tenderness: There is no abdominal tenderness. There is no guarding or rebound.  Musculoskeletal:        General: No tenderness. Normal range of motion.     Cervical back: Normal range of motion and neck supple.  Skin:    General: Skin is warm.  Neurological:     Mental Status: He is alert and oriented to person, place, and time.  Psychiatric:        Mood and Affect: Affect normal.    LABORATORY DATA:  I have reviewed the data as listed Lab Results  Component Value Date   WBC 5.3 01/28/2021   HGB 11.8 (L) 01/28/2021   HCT 35.5 (L) 01/28/2021   MCV 98.1 01/28/2021   PLT 163 01/28/2021   Recent Labs    06/25/20 0841 07/08/20 0843 07/22/20 0836 08/05/20 0821 12/31/20 0928 01/06/21 1340 01/15/21 0739 01/28/21 1002  NA 138 139 138   < > 139 140 139 139  K 3.6 3.6 3.6   < > 3.6 4.3 4.0 3.8  CL 108 108 107   < > 106 106 106 105  CO2 _0 < > _1 GLUCOSE 201* 209* 195*   < > 168* 96 117* 119*  BUN _2 < > _3 CREATININE 0.80  0.70 0.89   < > 0.77 0.64 0.82 0.75  CALCIUM 8.0* 8.0* 8.1*   < > 8.1* 8.8* 8.6* 8.6*  GFRNONAA >  60 >60 >60   < > >60 >60 >60 >60  GFRAA >60 >60 >60  --   --   --   --   --   PROT 6.5 6.5 6.7   < > 6.6  --  7.7 7.0  ALBUMIN 3.2* 3.1* 3.1*   < > 2.7*  --  3.5 3.3*  AST 32 31 29   < > 28  --  43* 30  ALT _0 < > 17  --  25 19  ALKPHOS 121 102 108   < > 118  --  176* 119  BILITOT 0.8 0.7 0.8   < > 0.8  --  0.7 0.5   < > = values in this interval not displayed.    RADIOGRAPHIC STUDIES: I have personally reviewed the radiological images as listed and agreed with the findings in the report. DG Chest 1 View  Result Date: 01/15/2021 CLINICAL DATA:  Metastatic colon cancer.  Pre liver ablation. EXAM: CHEST  1 VIEW COMPARISON:  11/08/2020 FINDINGS: The lungs are symmetrically well expanded. Since the prior examination, an 8 mm nodule has developed within the right mid lung zone, indeterminate. No pneumothorax or pleural effusion. Cardiac size within normal limits. Right internal jugular chest port is seen with its tip at the superior cavoatrial junction. The pulmonary vascularity is normal. No acute bone abnormality. IMPRESSION: Interval development of an 8 mm pulmonary nodule within the right mid lung zone, indeterminate. This would be better assessed with CT imaging. No radiographic evidence of acute cardiopulmonary disease. Electronically Signed   By: Fidela Salisbury MD   On: 01/15/2021 07:25   CT GUIDE TISSUE ABLATION  Result Date: 01/15/2021 INDICATION: 80 year old male with colorectal cancer metastatic to the liver. He presents for percutaneous thermal ablation having previously undergone transarterial bland embolization on 12/19/2020. EXAM: CT-guided microwave ablation of liver lesion COMPARISON:  MRI abdomen 11/26/2020 MEDICATIONS: 2 g Ancef; The antibiotic was administered in an appropriate time interval prior to needle puncture of the skin. ANESTHESIA/SEDATION: General - as administered by  the Anesthesia department FLUOROSCOPY TIME:  None. COMPLICATIONS: None immediate. TECHNIQUE: Informed written consent was obtained from the patient after a thorough discussion of the procedural risks, benefits and alternatives. All questions were addressed. Maximal Sterile Barrier Technique was utilized including caps, mask, sterile gowns, sterile gloves, sterile drape, hand hygiene and skin antiseptic. A timeout was performed prior to the initiation of the procedure. Limited ultrasound evaluation of the right liver was performed while the patient was on the CT gantry. Due to relatively high positioning of the left hepatic lobe and overall heterogeneity of the liver, the mass was not easily visualized. Therefore, axial CT imaging was obtained. The low-attenuation mass in the left hepatic lobe was successfully identified. The mass is easily visible without intravenous contrast. Under intermittent CT guidance, a total of 3 NeuWave PR XT probes were advanced into the mass in a triangular configuration with probes oriented parallel and spaced 1-1.5 cm apart. Follow-up axial CT imaging was performed confirming probe placement and the absence of any complication from a probe placement. Percutaneous ablation was then performed with each probe powered at 65 watts for a total of 10 minutes. Intermittent CT imaging during the course of the ablation demonstrates a well-formed ablation zone and no evidence of immediate complication. Following probe removal, a triple phase CT scan was performed. No evidence of hemorrhage, pneumothorax or other complicating feature. The ablation zone appears to entirely encompass the targeted  mass. FINDINGS: Technically successful percutaneous microwave ablation. IMPRESSION: Technically successful percutaneous microwave ablation. Electronically Signed   By: Jacqulynn Cadet M.D.   On: 01/15/2021 15:58    ASSESSMENT & PLAN:   Cancer of right colon ([place]) #Right-sided colon  adenocarcinoma-with synchronous metastasis to liver/unresectable. Currently on FOLFIRI plus Avastin on HOLD; MRI FEB 2022- over stable liver disease disease; but peptic ulcer [see below]  # HOLD further chemo [sec to poor tolerance]; currently s/p liver ablation on 3/10; s/p micorwave ablation  On 3/30;  Recommend imaging in 2 to 3 months posttreatment. will discuss with IR regarding timing of imaging.  Continue to hold systemic chemotherapy at this time.  If progressive disease noted would recommend- TAS 102 vs. Regorafenib.   # Peptic ulcer disease/duodenal ulcer-NSAID related.  S/p EGD on 2/16-continue PPI.  # PN- G-1-2; sec to oxaliplatin-stable  # Mediport- port flush q 2-3 months  # Shoulder pain-refill oxycodone Silver.[place]. ]  # DISPOSITION: de-access # follow up in 2 month- MD; labs- cbc/cmp CEA/port flush- Dr.B    All questions were answered. The patient knows to call the clinic with any problems, questions or concerns.    Cammie Sickle, MD 01/28/2021 1:08 PM

## 2021-01-29 LAB — CEA: CEA: 9.8 ng/mL — ABNORMAL HIGH (ref 0.0–4.7)

## 2021-02-11 ENCOUNTER — Encounter: Payer: Self-pay | Admitting: *Deleted

## 2021-02-11 ENCOUNTER — Ambulatory Visit
Admission: RE | Admit: 2021-02-11 | Discharge: 2021-02-11 | Disposition: A | Discharge status: Home / Self Care | Payer: Medicare HMO | Source: Ambulatory Visit | Attending: Physician Assistant | Admitting: Physician Assistant

## 2021-02-11 DIAGNOSIS — C189 Malignant neoplasm of colon, unspecified: Secondary | ICD-10-CM | POA: Diagnosis not present

## 2021-02-11 DIAGNOSIS — C787 Secondary malignant neoplasm of liver and intrahepatic bile duct: Secondary | ICD-10-CM | POA: Diagnosis not present

## 2021-02-11 DIAGNOSIS — Z9889 Other specified postprocedural states: Secondary | ICD-10-CM | POA: Diagnosis not present

## 2021-02-11 HISTORY — PX: IR RADIOLOGIST EVAL & MGMT: IMG5224

## 2021-02-11 NOTE — Progress Notes (Signed)
Chief Complaint: Patient was consulted remotely today (TeleHealth) for colon cancer metastatic to the liver at the request of Ardis Rowan.    Referring Physician(s): Charlaine Dalton, MD  History of Present Illness: Oscar [name] is a 80 y.o. 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   Patient underwent neoadjuvant chemotherapy and right hemicolectomy (May 2020).  Hepatic metastatic disease also confirmed by biopsy in May 2020.  CT imaging in September 2020 showed progression of metastatic disease.  FOLFOX + Avastin begun Oct 2020.  In Sept 2021, CT imaging showed progression of disease and he was transitioned to FOLFIRI + Avastin.   MRI 11/27/20 shows stable hepatic disease with 2 discrete lesions (Seg 2/3 4.4 x 4.2 cm and Seg 6 1.2 cm).  However, Oscar [name] also developed a peptic ulcer and has been begun on carafate and PPI.  Further chemo held for now and patient is referred for evaluation for liver directed therapy.   Of note, CT CAP from 09/24/20 also shows pulmonary metastatic disease and a peritoneal implant just caudal to the prior hemicolectomy anastomosis.    Oscar [name] underwent bland embolization of the dominant lesion in the left hepatic lobe on 12/19/2020 followed by percutaneous thermal ablation on 01/15/2021.  The smaller lesion in hepatic segment 6 was not well visualized at that time and was not targeted.  Oscar [name] reports that he has fully recovered.  He has had no pain, nausea, vomiting or other symptoms.  His appetite is good.  His bowels are regular.  He has no active complaints.  Past Medical History:  Diagnosis Date  . Arthritis   . Cancer ([place])    colon cancer 02/2019 per pt   . Family history of adverse reaction to anesthesia    cousin took all night to wake up from anesthesia  . H/O colon cancer, stage IV   . Hyperlipemia   . Neuromuscular disorder ([place])   . Pre-diabetes     Past Surgical History:   Procedure Laterality Date  . COLON SURGERY    . ESOPHAGOGASTRODUODENOSCOPY (EGD) WITH PROPOFOL N/A 12/26/2020   Procedure: ESOPHAGOGASTRODUODENOSCOPY (EGD) WITH PROPOFOL;  Surgeon: [name], MD;  Location: [facility] ENDOSCOPY;  Service: Gastroenterology;  Laterality: N/A;  . IR ANGIOGRAM SELECTIVE EACH ADDITIONAL VESSEL  12/19/2020  . IR ANGIOGRAM VISCERAL SELECTIVE  12/19/2020  . IR EMBO TUMOR ORGAN ISCHEMIA INFARCT INC GUIDE ROADMAPPING  12/19/2020  . IR IMAGING GUIDED PORT INSERTION  07/20/2019  . IR RADIOLOGIST EVAL & MGMT  12/05/2020  . IR US GUIDE VASC ACCESS RIGHT  12/19/2020  . JOINT REPLACEMENT Left 2010   Knee  . RADIOLOGY WITH ANESTHESIA N/A 01/15/2021   Procedure: CT WITH ANESTHESIA MICROWAVE ABLATION OF LIVER;  Surgeon: Criselda Peaches, MD;  Location: [facility] ORS;  Service: Anesthesiology;  Laterality: N/A;    Allergies: Ace inhibitors  Medications: Prior to Admission medications   Medication Sig Start Date End Date Taking? Authorizing Provider  Nirmatrelvir & Ritonavir 20 x 150 MG & 10 x 100MG  TBPK TAKE 3 TABLETS AS DIRECTED BY MOUTH 2 TIMES DAILY FOR 5 DAYS. Patient not taking: Reported on 01/28/2021 10/22/20 10/22/21  Verlon Au, NP  nystatin (MYCOSTATIN) 100000 UNIT/ML suspension Take 5 mLs (500,000 Units total) by mouth 4 (four) times daily. Patient not taking: Reported on 01/28/2021 11/05/20   Cammie Sickle, MD  ondansetron (ZOFRAN ODT) 8 MG disintegrating tablet Take 1 tablet (8 mg total) by mouth every 8 (  eight) hours as needed for nausea or vomiting. 11/19/20   Cammie Sickle, MD  ondansetron (ZOFRAN) 4 MG tablet Take 1 tablet (4 mg total) by mouth every 8 (eight) hours as needed for nausea or vomiting. 01/15/21   Ardis Rowan, PA-C  ondansetron (ZOFRAN) 8 MG tablet Take 1 tablet (8 mg total) by mouth every 8 (eight) hours as needed for nausea or vomiting. 12/19/20   Louk, Bea Graff, PA-C  oxyCODONE (OXY IR/ROXICODONE) 5 MG immediate release tablet Take 1  tablet (5 mg total) by mouth every 6 (six) hours as needed for severe pain. 01/27/21   Borders, Kirt Boys, NP  pantoprazole (PROTONIX) 40 MG tablet Take 1 tablet (40 mg total) by mouth 2 (two) times daily. Patient not taking: Reported on 01/28/2021 11/27/20   Cammie Sickle, MD  pravastatin (PRAVACHOL) 40 MG tablet Take 40 mg by mouth daily.     [provider]  prochlorperazine (COMPAZINE) 10 MG tablet Take 1 tablet (10 mg total) by mouth every 6 (six) hours as needed for nausea or vomiting. Patient not taking: Reported on 01/28/2021 11/21/20   Borders, Kirt Boys, NP  sucralfate (CARAFATE) 1 g tablet Take 1 tablet (1 g total) by mouth 4 (four) times daily -  with meals and at bedtime. 11/27/20   Cammie Sickle, MD  tamsulosin (FLOMAX) 0.4 MG CAPS capsule Take 1 capsule (0.4 mg total) by mouth daily. 01/27/21 01/27/22  Borders, Kirt Boys, NP     Family History  Problem Relation Age of Onset  . Peptic Ulcer Disease Father     Social History   Socioeconomic History  . Marital status: Married    Spouse name: Not on file  . Number of children: Not on file  . Years of education: Not on file  . Highest education level: Not on file  Occupational History  . Not on file  Tobacco Use  . Smoking status: Current Every Day Smoker    Packs/day: 0.25    Years: 15.00    Pack years: 3.75    Types: Cigarettes  . Smokeless tobacco: Never Used  . Tobacco comment: 1 to 2 cigarettes a day occasionally  Vaping Use  . Vaping Use: Never used  Substance and Sexual Activity  . Alcohol use: Yes    Comment: rarely   . Drug use: No  . Sexual activity: Not on file  Other Topics Concern  . Not on file  Social History Narrative   Recruitment consultant retd; lives in [place]; smoking 3cig/day; [3/4 ppd x started at 9 years]; no alcohol. Son & daughter; wife dementia [waiting for placement].    Social Determinants of Health   Financial Resource Strain: Not on file  Food Insecurity: Not  on file  Transportation Needs: Not on file  Physical Activity: Not on file  Stress: Not on file  Social Connections: Not on file    ECOG Status: 0 - Asymptomatic  Review of Systems  Review of Systems: A 12 point ROS discussed and pertinent positives are indicated in the HPI above.  All other systems are negative.  Physical Exam No direct physical exam was performed (except for noted visual exam findings with Video Visits).   Vital Signs: There were no vitals taken for this visit.  Imaging: DG Chest 1 View  Result Date: 01/15/2021 CLINICAL DATA:  Metastatic colon cancer.  Pre liver ablation. EXAM: CHEST  1 VIEW COMPARISON:  11/08/2020 FINDINGS: The lungs are symmetrically well expanded. Since the  prior examination, an 8 mm nodule has developed within the right mid lung zone, indeterminate. No pneumothorax or pleural effusion. Cardiac size within normal limits. Right internal jugular chest port is seen with its tip at the superior cavoatrial junction. The pulmonary vascularity is normal. No acute bone abnormality. IMPRESSION: Interval development of an 8 mm pulmonary nodule within the right mid lung zone, indeterminate. This would be better assessed with CT imaging. No radiographic evidence of acute cardiopulmonary disease. Electronically Signed   By: Fidela Salisbury MD   On: 01/15/2021 07:25   CT GUIDE TISSUE ABLATION  Result Date: 01/15/2021 INDICATION: 80 year old male with colorectal cancer metastatic to the liver. He presents for percutaneous thermal ablation having previously undergone transarterial bland embolization on 12/19/2020. EXAM: CT-guided microwave ablation of liver lesion COMPARISON:  MRI abdomen 11/26/2020 MEDICATIONS: 2 g Ancef; The antibiotic was administered in an appropriate time interval prior to needle puncture of the skin. ANESTHESIA/SEDATION: General - as administered by the Anesthesia department FLUOROSCOPY TIME:  None. COMPLICATIONS: None immediate. TECHNIQUE:  Informed written consent was obtained from the patient after a thorough discussion of the procedural risks, benefits and alternatives. All questions were addressed. Maximal Sterile Barrier Technique was utilized including caps, mask, sterile gowns, sterile gloves, sterile drape, hand hygiene and skin antiseptic. A timeout was performed prior to the initiation of the procedure. Limited ultrasound evaluation of the right liver was performed while the patient was on the CT gantry. Due to relatively high positioning of the left hepatic lobe and overall heterogeneity of the liver, the mass was not easily visualized. Therefore, axial CT imaging was obtained. The low-attenuation mass in the left hepatic lobe was successfully identified. The mass is easily visible without intravenous contrast. Under intermittent CT guidance, a total of 3 NeuWave PR XT probes were advanced into the mass in a triangular configuration with probes oriented parallel and spaced 1-1.5 cm apart. Follow-up axial CT imaging was performed confirming probe placement and the absence of any complication from a probe placement. Percutaneous ablation was then performed with each probe powered at 65 watts for a total of 10 minutes. Intermittent CT imaging during the course of the ablation demonstrates a well-formed ablation zone and no evidence of immediate complication. Following probe removal, a triple phase CT scan was performed. No evidence of hemorrhage, pneumothorax or other complicating feature. The ablation zone appears to entirely encompass the targeted mass. FINDINGS: Technically successful percutaneous microwave ablation. IMPRESSION: Technically successful percutaneous microwave ablation. Electronically Signed   By: Jacqulynn Cadet M.D.   On: 01/15/2021 15:58    Labs:  CBC: Recent Labs    12/31/20 0928 01/06/21 1340 01/15/21 0739 01/28/21 1002  WBC 4.9 6.1 5.2 5.3  HGB 11.4* 12.7* 12.5* 11.8*  HCT 34.2* 40.0 38.1* 35.5*  PLT 183  203 150 163    COAGS: Recent Labs    12/19/20 0954 01/15/21 0739  INR 1.0 0.9    BMP: Recent Labs    06/10/20 0858 06/25/20 0841 07/08/20 0843 07/22/20 0836 08/05/20 0821 12/31/20 0928 01/06/21 1340 01/15/21 0739 01/28/21 1002  NA 138 138 139 138   < > 139 140 139 139  K 3.7 3.6 3.6 3.6   < > 3.6 4.3 4.0 3.8  CL 107 108 108 107   < > 106 106 106 105  CO2 25 24 25 25    < > 26 26 25 27   GLUCOSE 205* 201* 209* 195*   < > 168* 96 117* 119*  BUN 11 10  9 10   < > 8 11 12 11   CALCIUM 8.2* 8.0* 8.0* 8.1*   < > 8.1* 8.8* 8.6* 8.6*  CREATININE 0.89 0.80 0.70 0.89   < > 0.77 0.64 0.82 0.75  GFRNONAA >60 >60 >60 >60   < > >60 >60 >60 >60  GFRAA >60 >60 >60 >60  --   --   --   --   --    < > = values in this interval not displayed.    LIVER FUNCTION TESTS: Recent Labs    12/19/20 0954 12/31/20 0928 01/15/21 0739 01/28/21 1002  BILITOT 1.2 0.8 0.7 0.5  AST 34 28 43* 30  ALT 20 17 25 19   ALKPHOS 118 118 176* 119  PROT 6.9 6.6 7.7 7.0  ALBUMIN 3.0* 2.7* 3.5 3.3*    TUMOR MARKERS: No results for input(s): AFPTM, CEA, CA199, CHROMGRNA in the last 8760 hours.  Assessment and Plan:  80 year old male doing extremely well 3 weeks status post percutaneous thermal ablation of colon cancer metastatic to the left hemiliver.  He is completely recovered from the procedure and has no clinical symptoms.    1.)  Resume full activity.  2.)  MRI ABD with contrast early May to assess treated lesion in the left hemiliver and the untreated lesion in segment 6 with accompanying clinic visit.    Electronically Signed: Criselda Peaches 02/11/2021, 11:17 AM   I spent a total of 10 Minutes in remote  clinical consultation, greater than 50% of which was counseling/coordinating care for colon cancer metastatic to the liver.    Visit type: Audio only (telephone). Audio (no video) only due to patient preference. Alternative for in-person consultation at [facility], [address]  [place], [place], [place]. This visit type was conducted due to national recommendations for restrictions regarding the COVID-19 Pandemic (e.g. social distancing).  This format is felt to be most appropriate for this patient at this time.  All issues noted in this document were discussed and addressed.

## 2021-02-12 ENCOUNTER — Other Ambulatory Visit: Payer: Self-pay | Admitting: Interventional Radiology

## 2021-02-12 DIAGNOSIS — C787 Secondary malignant neoplasm of liver and intrahepatic bile duct: Secondary | ICD-10-CM

## 2021-02-12 DIAGNOSIS — C189 Malignant neoplasm of colon, unspecified: Secondary | ICD-10-CM

## 2021-02-20 ENCOUNTER — Telehealth: Payer: Self-pay

## 2021-02-20 NOTE — Telephone Encounter (Signed)
Called patient to schedule a repeat EGD. Patient declined at this time. Would like to wait until after MRI results are back. Will call once he is ready to schedule.

## 2021-02-24 ENCOUNTER — Other Ambulatory Visit: Payer: Self-pay

## 2021-02-24 ENCOUNTER — Inpatient Hospital Stay: Payer: Medicare HMO | Attending: Hospice and Palliative Medicine | Admitting: Hospice and Palliative Medicine

## 2021-02-24 DIAGNOSIS — C182 Malignant neoplasm of ascending colon: Secondary | ICD-10-CM

## 2021-02-24 NOTE — Progress Notes (Signed)
Virtual Visit via Telephone Note  I connected with Oscar [name] on 02/24/21 at  9:30 AM EDT by telephone and verified that I am speaking with the correct person using two identifiers.  Location: Patient: Home Provider: Home   I discussed the limitations, risks, security and privacy concerns of performing an evaluation and management service by telephone and the availability of in person appointments. I also discussed with the patient that there may be a patient responsible charge related to this service. The patient expressed understanding and agreed to proceed.   History of Present Illness: Oscar [name] is a 80 y.o. male with multiple medical problems including stage IV colon cancer s/p FOLFIRI plus Avastin chemotherapy now on surveillance.  Patient was referred to palliative care to help address goals and manage ongoing symptoms.   Observations/Objective: Patient underwent embolization to liver lesions on 01/15/2021.  He is pending MRI abdomen on 5/12.   Patient denies any symptomatic complaints. Overall, he feels good. Appetite is stable. Performance status is stable. Patient has follow up in June with Dr. Rogue Bussing.   No issues with meds or need for refills today.   Assessment and Plan: Metastatic colon cancer -status post liver embolization.  Symptomatically appears to be doing well.  He has follow-up in June with Dr. Rogue Bussing.  Neoplasm related pain -continue oxycodone  Follow Up Instructions: Follow-up MyChart visit 2 months   I discussed the assessment and treatment plan with the patient. The patient was provided an opportunity to ask questions and all were answered. The patient agreed with the plan and demonstrated an understanding of the instructions.   The patient was advised to call back or seek an in-person evaluation if the symptoms worsen or if the condition fails to improve as anticipated.  I provided 22minutes of non-face-to-face time during this  encounter.   [name], NP

## 2021-02-27 ENCOUNTER — Ambulatory Visit
Admission: RE | Admit: 2021-02-27 | Discharge: 2021-02-27 | Disposition: A | Discharge status: Home / Self Care | Payer: Medicare HMO | Source: Ambulatory Visit | Attending: Interventional Radiology | Admitting: Interventional Radiology

## 2021-02-27 ENCOUNTER — Telehealth: Payer: Self-pay

## 2021-02-27 ENCOUNTER — Other Ambulatory Visit: Payer: Self-pay

## 2021-02-27 DIAGNOSIS — I700 Atherosclerosis of aorta: Secondary | ICD-10-CM | POA: Diagnosis not present

## 2021-02-27 DIAGNOSIS — K7689 Other specified diseases of liver: Secondary | ICD-10-CM | POA: Diagnosis not present

## 2021-02-27 DIAGNOSIS — C189 Malignant neoplasm of colon, unspecified: Secondary | ICD-10-CM | POA: Diagnosis not present

## 2021-02-27 DIAGNOSIS — C787 Secondary malignant neoplasm of liver and intrahepatic bile duct: Secondary | ICD-10-CM | POA: Insufficient documentation

## 2021-02-27 IMAGING — MR MR ABDOMEN WO/W CM
18 series · 48 of 48 positions shown · IV contrast (7.5ml Gadavist)
Comparison: CT guided microwave ablation, [DATE], MR abdomen,
[DATE], CT abdomen pelvis, [DATE], [DATE]

CLINICAL DATA: Follow-up microwave ablation of colorectal
metastases

EXAM:
MRI ABDOMEN WITHOUT AND WITH CONTRAST
TECHNIQUE: Multiplanar multisequence MR imaging of the abdomen was performed
both before and after the administration of intravenous contrast.
CONTRAST:  7.5mL GADAVIST GADOBUTROL 1 MMOL/ML IV SOLN

[Series 3: T2 · coronal · 6.5mm · 1.19mm/px · 2 of 30 slices shown (1 of 2)]
[im 1/30]
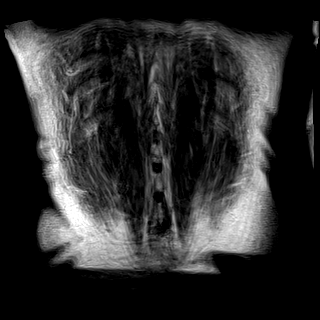
[im 30/30]
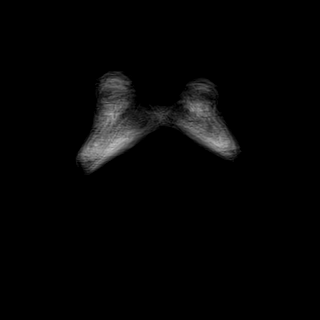

[Series 4: T2 · axial · 6.5mm · 1.19mm/px · z∈[-123,+134]mm · 2 of 34 slices shown (2 of 2)]
[im 1/34]
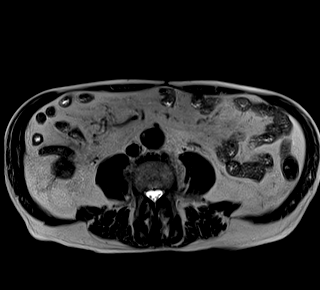
[im 34/34]
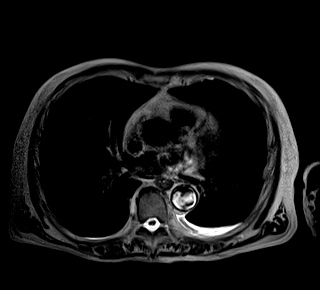

[Series 6: T2 fat-sat · axial · 6.0mm · 1.19mm/px · z∈[-117,+128]mm · 2 of 35 slices shown]
[im 1/35]
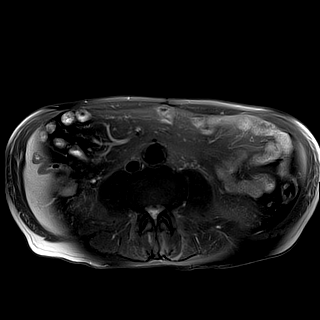
[im 35/35]
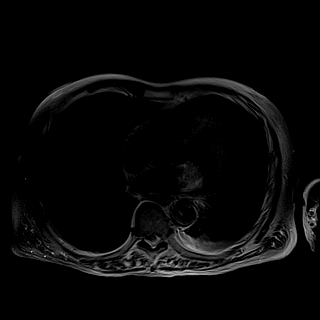

[Series 7: ax dwi_tracew · axial · 6.0mm · 1.42mm/px · z∈[-121,+131]mm · 4 of 108 slices shown]
[im 1/108]
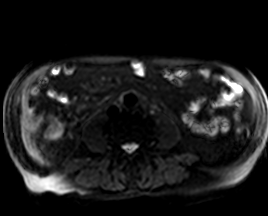
[im 36/108]
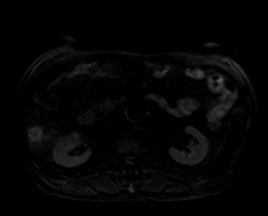
[im 72/108]
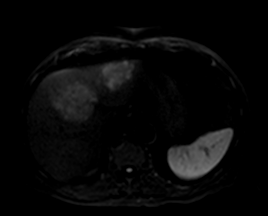
[im 108/108]
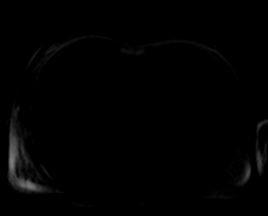

[Series 8: ax dwi_adc · axial · 6.0mm · 1.42mm/px · 1 of 36 slices shown]
[im 1/36]
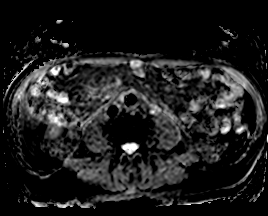

[Series 9: T1 · axial · 3.0mm · 1.19mm/px · z∈[-110,+127]mm · 3 of 80 slices shown (1 of 2)]
[im 1/80]
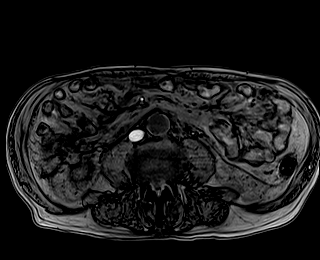
[im 40/80]
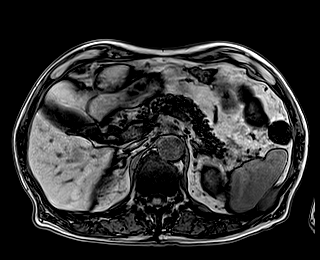
[im 80/80]
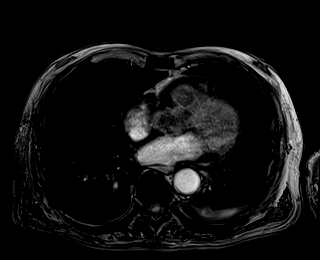

[Series 10: T1 · axial · 3.0mm · 1.19mm/px · z∈[-110,+127]mm · 3 of 80 slices shown (2 of 2)]
[im 1/80]
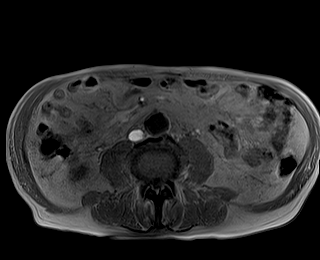
[im 40/80]
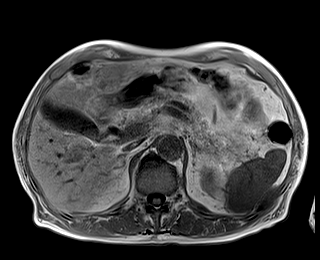
[im 80/80]
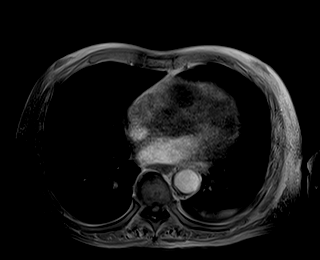

[Series 11: bSSFP · axial · 6.5mm · 0.74mm/px · 1 of 34 slices shown]
[im 1/34]
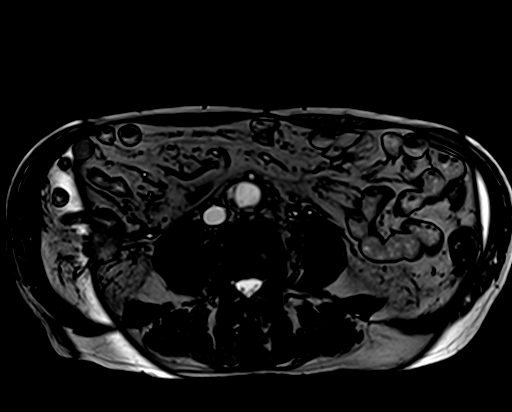

[Series 12: T1 dynamic fat-sat · axial · non-contrast · 3.0mm · 1.19mm/px · z∈[-113,+124]mm · 3 of 80 slices shown (1 of 5)]
[im 1/80]
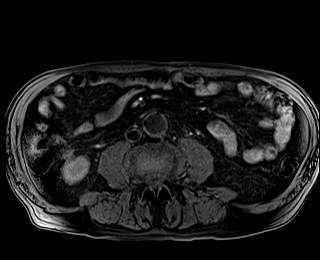
[im 40/80]
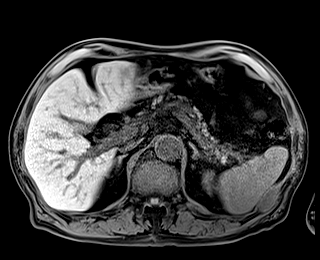
[im 80/80]
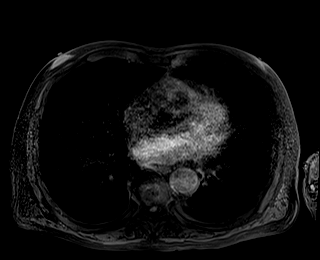

[Series 13: T1 dynamic fat-sat post-contrast · axial · 3.0mm · 1.19mm/px · z∈[-113,+124]mm · 3 of 80 slices shown (1 of 4)]
[im 1/80]
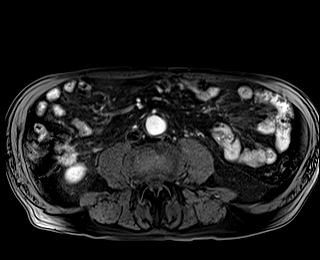
[im 40/80]
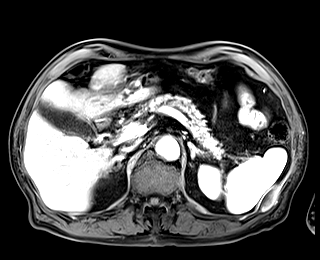
[im 80/80]
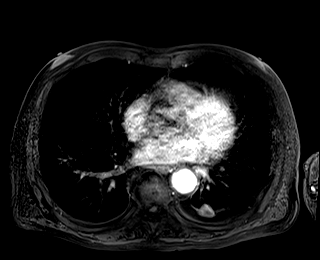

[Series 14: T1 dynamic fat-sat · axial · 3.0mm · 1.19mm/px · z∈[-113,+124]mm · 3 of 80 slices shown (2 of 5)]
[im 1/80]
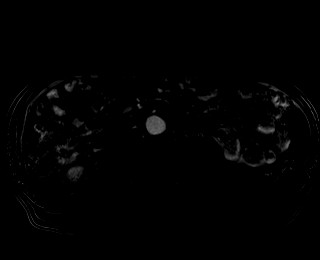
[im 40/80]
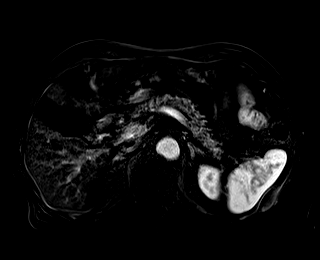
[im 80/80]
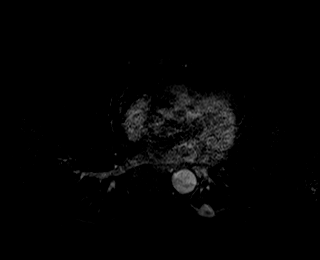

[Series 15: T1 dynamic fat-sat post-contrast · axial · 3.0mm · 1.19mm/px · z∈[-113,+124]mm · 3 of 80 slices shown (2 of 4)]
[im 1/80]
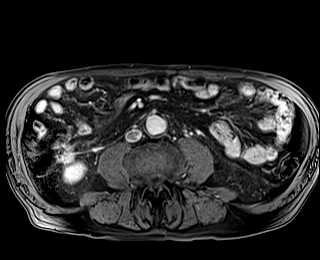
[im 40/80]
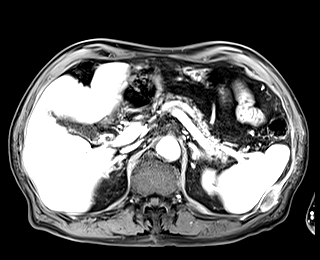
[im 80/80]
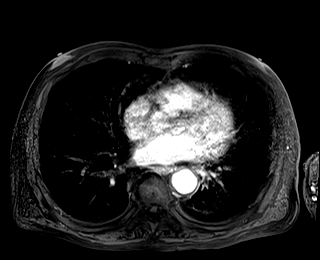

[Series 16: T1 dynamic fat-sat · axial · 3.0mm · 1.19mm/px · z∈[-113,+124]mm · 3 of 80 slices shown (3 of 5)]
[im 1/80]
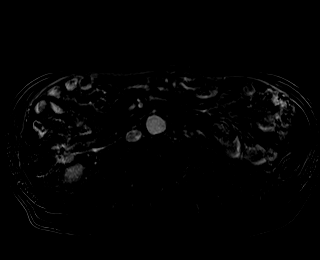
[im 40/80]
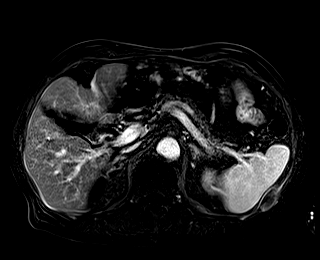
[im 80/80]
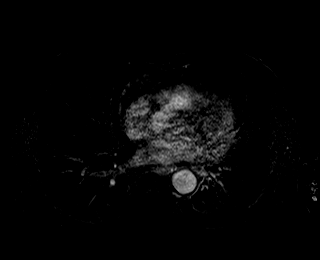

[Series 17: T1 dynamic fat-sat post-contrast · axial · 3.0mm · 1.19mm/px · z∈[-113,+124]mm · 3 of 80 slices shown (3 of 4)]
[im 1/80]
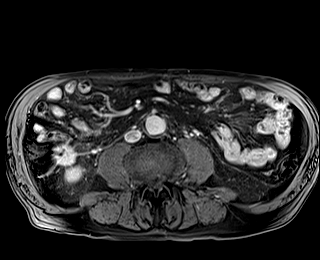
[im 40/80]
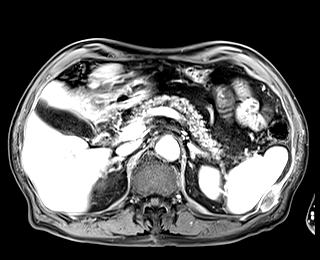
[im 80/80]
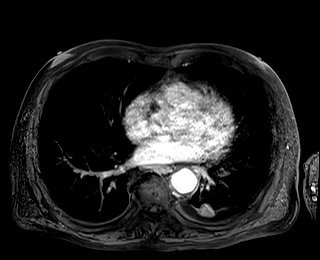

[Series 18: T1 dynamic fat-sat · axial · 3.0mm · 1.19mm/px · z∈[-113,+124]mm · 3 of 80 slices shown (4 of 5)]
[im 1/80]
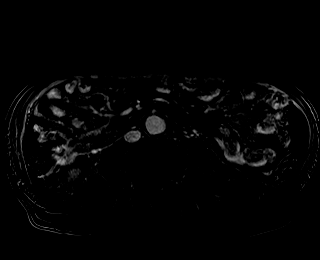
[im 40/80]
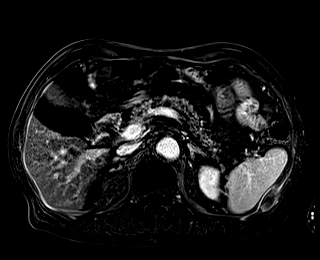
[im 80/80]
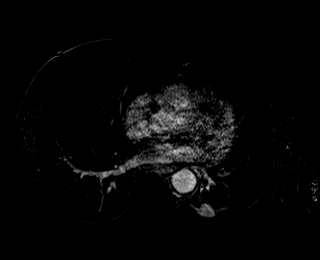

[Series 19: T1 dynamic post-contrast · coronal · 3.0mm · 1.31mm/px · 3 of 72 slices shown]
[im 1/72]
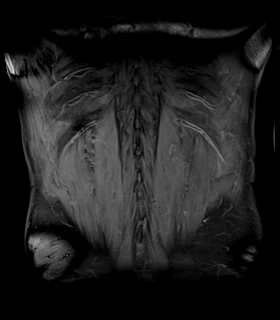
[im 36/72]
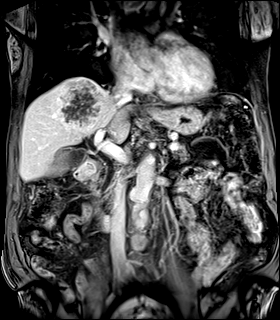
[im 72/72]
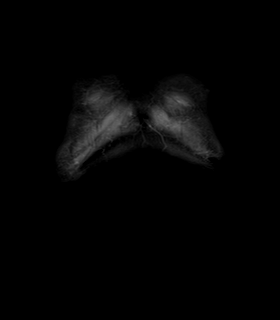

[Series 20: T1 dynamic fat-sat post-contrast · axial · 3.0mm · 1.19mm/px · z∈[-113,+124]mm · 3 of 80 slices shown (4 of 4)]
[im 1/80]
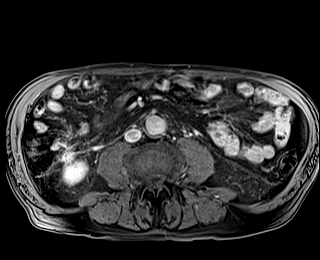
[im 40/80]
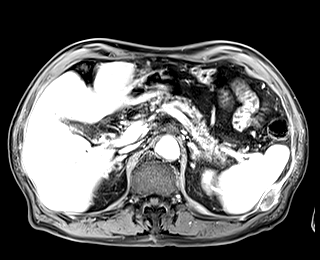
[im 80/80]
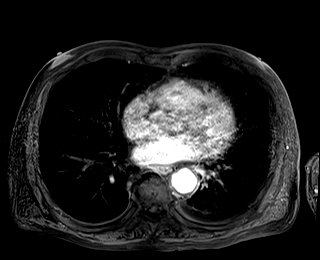

[Series 21: T1 dynamic fat-sat · axial · 3.0mm · 1.19mm/px · z∈[-113,+124]mm · 3 of 80 slices shown (5 of 5)]
[im 1/80]
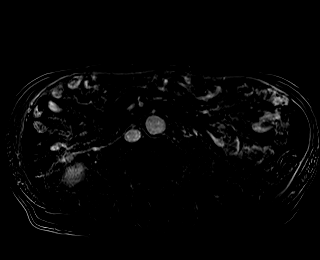
[im 40/80]
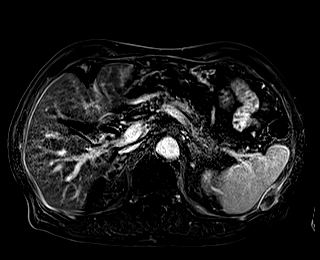
[im 80/80]
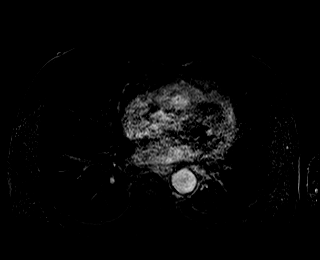

[48 of 48 positions shown; findings below may reference images not displayed]

FINDINGS: Lower chest: No acute findings.

Hepatobiliary: Redemonstrated lesion of the liver dome measuring
approximately 7.0 x 6.8 cm. Although not changed in size and
previously described as a simple cyst, this lesion demonstrates new
internal complexity and substantial heterogeneous peripheral
contrast enhancement not seen on prior examination (series 20, image
22). Ablation site of the anterior left lobe of the liver, hepatic
segment II/III demonstrates no residual contrast enhancement (series
17, image 30). A rim enhancing, internally hypoenhancing lesion of
the inferior right lobe of the liver, hepatic segment VI, has
substantially increased in size, measuring 3.3 x 2.8 cm, previously
1.1 cm (series 17, image 45). There is a new metastatic lesion just
inferior, in the tip of the right lobe of the liver, hepatic segment
VI, measuring 2.4 x 2.2 cm (series 17, image 54).

Pancreas: No mass, inflammatory changes, or other parenchymal
abnormality identified.

Spleen:  Within normal limits in size and appearance.

Adrenals/Urinary Tract: No masses identified. No evidence of
hydronephrosis.

Stomach/Bowel: Visualized portions within the abdomen are
unremarkable.

Vascular/Lymphatic: No pathologically enlarged lymph nodes
identified. Aortic atherosclerosis.

Other:  None.

Musculoskeletal: No suspicious bone lesions identified.
IMPRESSION: 1. Ablation site of the anterior left lobe of the liver demonstrates
no residual contrast enhancement.
2. Marked interval enlargement of a rim enhancing, internally
hypoenhancing metastatic lesion of the inferior right lobe of the
liver, hepatic segment VI, measuring 3.3 x 2.8 cm, previously
cm.
3. New metastatic lesion just inferior, in the tip of the right lobe
of the liver, hepatic segment VI, measuring 2.4 x 2.2 cm.
4. Redemonstrated lesion of the liver dome measuring approximately
7.0 x 6.8 cm. Although not changed in size and previously described
as a simple cyst, this lesion demonstrates new internal complexity
and substantial heterogeneous peripheral contrast enhancement not
seen on prior examination, concerning for metastatic lesion. Note
that this lesion was not present on examination dated [DATE] and
developed on examination dated [DATE], not typical in behavior
for a benign liver cyst.

Aortic Atherosclerosis ([1Y]-[1Y]).

## 2021-02-27 MED ORDER — GADOBUTROL 1 MMOL/ML IV SOLN
7.5000 mL | Freq: Once | INTRAVENOUS | Status: AC | PRN
  Administered 2021-02-27: 7.5 mL via INTRAVENOUS

## 2021-02-27 NOTE — Telephone Encounter (Signed)
Patient says he had a MRI done today. Wants Dr. Vicente Males to take a look at the results. Says ulcers we're seen on the scan previously. Wants to know if Dr. Vicente Males needs to see him based on results?

## 2021-02-27 NOTE — Telephone Encounter (Signed)
Returned patients call. Informed patient of Dr. Georgeann Oppenheim comments. The MRI has not resulted yet. Pt will be contacted once results are available. Pt verbalized understanding.

## 2021-03-04 ENCOUNTER — Encounter: Payer: Self-pay | Admitting: *Deleted

## 2021-03-04 ENCOUNTER — Other Ambulatory Visit: Payer: Self-pay

## 2021-03-04 ENCOUNTER — Ambulatory Visit
Admission: RE | Admit: 2021-03-04 | Discharge: 2021-03-04 | Disposition: A | Discharge status: Home / Self Care | Payer: Medicare HMO | Source: Ambulatory Visit | Attending: Interventional Radiology | Admitting: Interventional Radiology

## 2021-03-04 ENCOUNTER — Other Ambulatory Visit: Payer: Self-pay | Admitting: Internal Medicine

## 2021-03-04 DIAGNOSIS — C189 Malignant neoplasm of colon, unspecified: Secondary | ICD-10-CM

## 2021-03-04 DIAGNOSIS — C787 Secondary malignant neoplasm of liver and intrahepatic bile duct: Secondary | ICD-10-CM | POA: Diagnosis not present

## 2021-03-04 DIAGNOSIS — K269 Duodenal ulcer, unspecified as acute or chronic, without hemorrhage or perforation: Secondary | ICD-10-CM

## 2021-03-04 HISTORY — PX: IR RADIOLOGIST EVAL & MGMT: IMG5224

## 2021-03-04 NOTE — Progress Notes (Signed)
Chief Complaint: Patient was seen in follow-up remotely today (TeleHealth) for colon cancer metastatic to the liver at the request of Adaleen Hulgan K.    Referring Physician(s): Dr. Charlaine Dalton  History of Present Illness: Oscar [name] is a 80 y.o. 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   Patient underwent neoadjuvant chemotherapy and right hemicolectomy (May 2020).  Hepatic metastatic disease also confirmed by biopsy in May 2020.  CT imaging in September 2020 showed progression of metastatic disease.  FOLFOX + Avastin begun Oct 2020.  In Sept 2021, CT imaging showed progression of disease and he was transitioned to FOLFIRI + Avastin.   MRI 11/27/20 shows stable hepatic disease with 2 discrete lesions (Seg 2/3 4.4 x 4.2 cm and Seg 6 1.2 cm).  However, [name] also developed a peptic ulcer and has been begun on carafate and PPI.  Further chemo held for now and patient is referred for evaluation for liver directed therapy.   Of note, CT CAP from 09/24/20 also shows pulmonary metastatic disease and a peritoneal implant just caudal to the prior hemicolectomy anastomosis.    [name] underwent bland embolization of the dominant lesion in the left hepatic lobe on 12/19/2020 followed by percutaneous thermal ablation on 01/15/2021.  The smaller lesion in hepatic segment 6 was not well visualized at that time and was not targeted.  MRI dated 02/27/21:  1. Ablation site of the anterior left lobe of the liver demonstrates no residual contrast enhancement. 2. Marked interval enlargement of a rim enhancing,metastatic lesion of the inferior right lobe of the liver, hepatic segment VI, measuring 3.3 x 2.8 cm, previously 1.1 cm. 3. New metastatic lesion just inferior, in the tip of the right lobe of the liver, hepatic segment VI, measuring 2.4 x 2.2 cm. 4. Redemonstrated lesion of the liver dome measuring approximately 7.0 x 6.8 cm. Although not  changed in size and previously described as a simple cyst, this lesion demonstrates new internal complexity and substantial heterogeneous peripheral contrast enhancement not seen on prior examination, concerning for metastatic lesion.  Note that this lesion was not present on examination dated 04/16/2018 and developed on examination dated 03/06/2019, not typical in behavior for a benign liver cyst.  [name] is feeling well.  His appetite remains good.  He denies abdominal pain, nausea, vomiting or other systemic symptoms.  Past Medical History:  Diagnosis Date  . Arthritis   . Cancer ([place])    colon cancer 02/2019 per pt   . Family history of adverse reaction to anesthesia    cousin took all night to wake up from anesthesia  . H/O colon cancer, stage IV   . Hyperlipemia   . Neuromuscular disorder ([place])   . Pre-diabetes     Past Surgical History:  Procedure Laterality Date  . COLON SURGERY    . ESOPHAGOGASTRODUODENOSCOPY (EGD) WITH PROPOFOL N/A 12/26/2020   Procedure: ESOPHAGOGASTRODUODENOSCOPY (EGD) WITH PROPOFOL;  Surgeon: [name], MD;  Location: [facility] ENDOSCOPY;  Service: Gastroenterology;  Laterality: N/A;  . IR ANGIOGRAM SELECTIVE EACH ADDITIONAL VESSEL  12/19/2020  . IR ANGIOGRAM VISCERAL SELECTIVE  12/19/2020  . IR EMBO TUMOR ORGAN ISCHEMIA INFARCT INC GUIDE ROADMAPPING  12/19/2020  . IR IMAGING GUIDED PORT INSERTION  07/20/2019  . IR RADIOLOGIST EVAL & MGMT  12/05/2020  . IR RADIOLOGIST EVAL & MGMT  02/11/2021  . IR US GUIDE VASC ACCESS RIGHT  12/19/2020  . JOINT REPLACEMENT Left 2010   Knee  .  RADIOLOGY WITH ANESTHESIA N/A 01/15/2021   Procedure: CT WITH ANESTHESIA MICROWAVE ABLATION OF LIVER;  Surgeon: Criselda Peaches, MD;  Location: [facility] ORS;  Service: Anesthesiology;  Laterality: N/A;    Allergies: Ace inhibitors  Medications: Prior to Admission medications   Medication Sig Start Date End Date Taking? Authorizing Provider  ondansetron (ZOFRAN) 4 MG tablet Take 1  tablet (4 mg total) by mouth every 8 (eight) hours as needed for nausea or vomiting. Patient not taking: Reported on 02/24/2021 01/15/21   Ardis Rowan, PA-C  ondansetron (ZOFRAN) 8 MG tablet Take 1 tablet (8 mg total) by mouth every 8 (eight) hours as needed for nausea or vomiting. Patient not taking: Reported on 02/24/2021 12/19/20   Ronney Lion, PA-C  oxyCODONE (OXY IR/ROXICODONE) 5 MG immediate release tablet Take 1 tablet (5 mg total) by mouth every 6 (six) hours as needed for severe pain. 01/27/21   Borders, Kirt Boys, NP  pantoprazole (PROTONIX) 40 MG tablet Take 1 tablet (40 mg total) by mouth 2 (two) times daily. Patient not taking: No sig reported 11/27/20   Cammie Sickle, MD  pravastatin (PRAVACHOL) 40 MG tablet Take 40 mg by mouth daily.     [provider]  prochlorperazine (COMPAZINE) 10 MG tablet Take 1 tablet (10 mg total) by mouth every 6 (six) hours as needed for nausea or vomiting. Patient not taking: No sig reported 11/21/20   Borders, Kirt Boys, NP  sucralfate (CARAFATE) 1 g tablet Take 1 tablet (1 g total) by mouth 4 (four) times daily -  with meals and at bedtime. 11/27/20   Cammie Sickle, MD  tamsulosin (FLOMAX) 0.4 MG CAPS capsule Take 1 capsule (0.4 mg total) by mouth daily. 01/27/21 01/27/22  Borders, Kirt Boys, NP     Family History  Problem Relation Age of Onset  . Peptic Ulcer Disease Father     Social History   Socioeconomic History  . Marital status: Married    Spouse name: Not on file  . Number of children: Not on file  . Years of education: Not on file  . Highest education level: Not on file  Occupational History  . Not on file  Tobacco Use  . Smoking status: Current Every Day Smoker    Packs/day: 0.25    Years: 15.00    Pack years: 3.75    Types: Cigarettes  . Smokeless tobacco: Never Used  . Tobacco comment: 1 to 2 cigarettes a day occasionally  Vaping Use  . Vaping Use: Never used  Substance and Sexual Activity  .  Alcohol use: Yes    Comment: rarely   . Drug use: No  . Sexual activity: Not on file  Other Topics Concern  . Not on file  Social History Narrative   Recruitment consultant retd; lives in [place]; smoking 3cig/day; [3/4 ppd x started at 9 years]; no alcohol. Son & daughter; wife dementia [waiting for placement].    Social Determinants of Health   Financial Resource Strain: Not on file  Food Insecurity: Not on file  Transportation Needs: Not on file  Physical Activity: Not on file  Stress: Not on file  Social Connections: Not on file    ECOG Status: 0 - Asymptomatic  Review of Systems  Review of Systems: A 12 point ROS discussed and pertinent positives are indicated in the HPI above.  All other systems are negative.  Physical Exam No direct physical exam was performed (except for noted visual exam findings with  Video Visits).   Vital Signs: There were no vitals taken for this visit.  Imaging: MR ABDOMEN WWO CONTRAST  Result Date: 02/28/2021 CLINICAL DATA:  Follow-up microwave ablation of colorectal metastases EXAM: MRI ABDOMEN WITHOUT AND WITH CONTRAST TECHNIQUE: Multiplanar multisequence MR imaging of the abdomen was performed both before and after the administration of intravenous contrast. CONTRAST:  7.49mL GADAVIST GADOBUTROL 1 MMOL/ML IV SOLN COMPARISON:  CT guided microwave ablation, 01/15/2021, MR abdomen, 11/26/2020, CT abdomen pelvis, 03/06/2019, 04/16/2018 FINDINGS: Lower chest: No acute findings. Hepatobiliary: Redemonstrated lesion of the liver dome measuring approximately 7.0 x 6.8 cm. Although not changed in size and previously described as a simple cyst, this lesion demonstrates new internal complexity and substantial heterogeneous peripheral contrast enhancement not seen on prior examination (series 20, image 22). Ablation site of the anterior left lobe of the liver, hepatic segment II/III demonstrates no residual contrast enhancement (series 17, image 30). A  rim enhancing, internally hypoenhancing lesion of the inferior right lobe of the liver, hepatic segment VI, has substantially increased in size, measuring 3.3 x 2.8 cm, previously 1.1 cm (series 17, image 45). There is a new metastatic lesion just inferior, in the tip of the right lobe of the liver, hepatic segment VI, measuring 2.4 x 2.2 cm (series 17, image 54). Pancreas: No mass, inflammatory changes, or other parenchymal abnormality identified. Spleen:  Within normal limits in size and appearance. Adrenals/Urinary Tract: No masses identified. No evidence of hydronephrosis. Stomach/Bowel: Visualized portions within the abdomen are unremarkable. Vascular/Lymphatic: No pathologically enlarged lymph nodes identified. Aortic atherosclerosis. Other:  None. Musculoskeletal: No suspicious bone lesions identified. IMPRESSION: 1. Ablation site of the anterior left lobe of the liver demonstrates no residual contrast enhancement. 2. Marked interval enlargement of a rim enhancing, internally hypoenhancing metastatic lesion of the inferior right lobe of the liver, hepatic segment VI, measuring 3.3 x 2.8 cm, previously 1.1 cm. 3. New metastatic lesion just inferior, in the tip of the right lobe of the liver, hepatic segment VI, measuring 2.4 x 2.2 cm. 4. Redemonstrated lesion of the liver dome measuring approximately 7.0 x 6.8 cm. Although not changed in size and previously described as a simple cyst, this lesion demonstrates new internal complexity and substantial heterogeneous peripheral contrast enhancement not seen on prior examination, concerning for metastatic lesion. Note that this lesion was not present on examination dated 04/16/2018 and developed on examination dated 03/06/2019, not typical in behavior for a benign liver cyst. Aortic Atherosclerosis (ICD10-I70.0). Electronically Signed   By: Eddie Candle M.D.   On: 02/28/2021 08:38   IR Radiologist Eval & Mgmt  Result Date: 02/11/2021 Please refer to notes tab  for details about interventional procedure. (Op Note)   Labs:  CBC: Recent Labs    12/31/20 0928 01/06/21 1340 01/15/21 0739 01/28/21 1002  WBC 4.9 6.1 5.2 5.3  HGB 11.4* 12.7* 12.5* 11.8*  HCT 34.2* 40.0 38.1* 35.5*  PLT 183 203 150 163    COAGS: Recent Labs    12/19/20 0954 01/15/21 0739  INR 1.0 0.9    BMP: Recent Labs    06/10/20 0858 06/25/20 0841 07/08/20 0843 07/22/20 0836 08/05/20 0821 12/31/20 0928 01/06/21 1340 01/15/21 0739 01/28/21 1002  NA 138 138 139 138   < > 139 140 139 139  K 3.7 3.6 3.6 3.6   < > 3.6 4.3 4.0 3.8  CL 107 108 108 107   < > 106 106 106 105  CO2 25 24 25 25    < > 26  26 25 27   GLUCOSE 205* 201* 209* 195*   < > 168* 96 117* 119*  BUN 11 10 9 10    < > 8 11 12 11   CALCIUM 8.2* 8.0* 8.0* 8.1*   < > 8.1* 8.8* 8.6* 8.6*  CREATININE 0.89 0.80 0.70 0.89   < > 0.77 0.64 0.82 0.75  GFRNONAA >60 >60 >60 >60   < > >60 >60 >60 >60  GFRAA >60 >60 >60 >60  --   --   --   --   --    < > = values in this interval not displayed.    LIVER FUNCTION TESTS: Recent Labs    12/19/20 0954 12/31/20 0928 01/15/21 0739 01/28/21 1002  BILITOT 1.2 0.8 0.7 0.5  AST 34 28 43* 30  ALT 20 17 25 19   ALKPHOS 118 118 176* 119  PROT 6.9 6.6 7.7 7.0  ALBUMIN 3.0* 2.7* 3.5 3.3*    TUMOR MARKERS: No results for input(s): AFPTM, CEA, CA199, CHROMGRNA in the last 8760 hours.  Assessment and Plan:  Unfortunately, most recent MRI imaging demonstrates progression of metastatic disease in the liver.  Specifically, there has been enlargement of the untreated small lesion in hepatic segment 6, there is a new lesion also inferiorly in hepatic segment 6, and there has been a significant interval change in the cystic lesion in the more superior aspect of the right hemiliver which was previously classified as a simple cyst.  I reviewed all of his prior imaging dating back over the last few years and it appears that this lesion initially appeared as a more solid lesion  which became simple and cystic over time.  This likely represented a treated metastatic lesion now with evidence of recurrent active disease.  The most recently treated lesion in the left hepatic lobe appears well treated without evidence of active disease.  Mr. Castiglia likely would benefit from resumption of systemic chemotherapy.  Additionally, given the bulk of disease in his right hepatic lobe I think he would also be an excellent candidate for concurrent transarterial radioembolization with Y90.  I will begin the process of getting him scheduled for a pre-Y 90 mapping procedure to be followed by right lobar transarterial radioembolization.  1.)  Please schedule for pre-Y 90 mapping to be followed by right lobar transarterial radioembolization.  Electronically Signed: Criselda Peaches 03/04/2021, 2:17 PM   I spent a total of  25 Minutes in remote  clinical consultation, greater than 50% of which was counseling/coordinating care for colon cancer m.    Visit type: Audio only (telephone). Audio (no video) only due to patient preference. Alternative for in-person consultation at [facility], [address], [place], [place]. This visit type was conducted due to national recommendations for restrictions regarding the COVID-19 Pandemic (e.g. social distancing).  This format is felt to be most appropriate for this patient at this time.  All issues noted in this document were discussed and addressed.

## 2021-03-07 ENCOUNTER — Other Ambulatory Visit (HOSPITAL_COMMUNITY): Payer: Self-pay | Admitting: Interventional Radiology

## 2021-03-07 DIAGNOSIS — C189 Malignant neoplasm of colon, unspecified: Secondary | ICD-10-CM

## 2021-03-11 ENCOUNTER — Other Ambulatory Visit: Payer: Self-pay | Admitting: Internal Medicine

## 2021-03-13 ENCOUNTER — Other Ambulatory Visit: Payer: Medicare HMO

## 2021-03-13 ENCOUNTER — Telehealth: Payer: Self-pay | Admitting: Internal Medicine

## 2021-03-13 NOTE — Telephone Encounter (Signed)
On 5/26-spoke to patient regarding recent MRI that showed new liver lesions.  Patient awaiting Y 90 with intervention radiology in June.  Discussed the need for starting systemic therapy; patient wants to wait until Y 90 for follow-up/discussed regarding systemic therapy.  Patient candidate for TAS 102/regorafenib.   Follow up as planned for now.   GB

## 2021-03-13 NOTE — Progress Notes (Signed)
Patient not discussed 

## 2021-03-20 ENCOUNTER — Other Ambulatory Visit: Payer: Self-pay | Admitting: Radiology

## 2021-03-21 ENCOUNTER — Other Ambulatory Visit: Payer: Self-pay | Admitting: Radiology

## 2021-03-24 ENCOUNTER — Encounter (HOSPITAL_COMMUNITY): Payer: Self-pay

## 2021-03-24 ENCOUNTER — Other Ambulatory Visit (HOSPITAL_COMMUNITY): Payer: Self-pay | Admitting: Interventional Radiology

## 2021-03-24 ENCOUNTER — Ambulatory Visit (HOSPITAL_COMMUNITY)
Admission: RE | Admit: 2021-03-24 | Discharge: 2021-03-24 | Disposition: A | Discharge status: Home / Self Care | Payer: Medicare HMO | Source: Ambulatory Visit | Attending: Interventional Radiology | Admitting: Interventional Radiology

## 2021-03-24 ENCOUNTER — Encounter (HOSPITAL_COMMUNITY)
Admission: RE | Admit: 2021-03-24 | Discharge: 2021-03-24 | Disposition: A | Discharge status: Home / Self Care | Payer: Medicare HMO | Source: Ambulatory Visit | Attending: Interventional Radiology | Admitting: Interventional Radiology

## 2021-03-24 ENCOUNTER — Other Ambulatory Visit: Payer: Self-pay

## 2021-03-24 DIAGNOSIS — Z79899 Other long term (current) drug therapy: Secondary | ICD-10-CM | POA: Insufficient documentation

## 2021-03-24 DIAGNOSIS — C189 Malignant neoplasm of colon, unspecified: Secondary | ICD-10-CM

## 2021-03-24 DIAGNOSIS — I700 Atherosclerosis of aorta: Secondary | ICD-10-CM | POA: Diagnosis not present

## 2021-03-24 DIAGNOSIS — C787 Secondary malignant neoplasm of liver and intrahepatic bile duct: Secondary | ICD-10-CM

## 2021-03-24 HISTORY — PX: IR ANGIOGRAM VISCERAL SELECTIVE: IMG657

## 2021-03-24 HISTORY — PX: IR US GUIDE VASC ACCESS RIGHT: IMG2390

## 2021-03-24 HISTORY — PX: IR EMBO ARTERIAL NOT HEMORR HEMANG INC GUIDE ROADMAPPING: IMG5448

## 2021-03-24 HISTORY — PX: IR ANGIOGRAM SELECTIVE EACH ADDITIONAL VESSEL: IMG667

## 2021-03-24 LAB — CBC WITH DIFFERENTIAL/PLATELET
Abs Immature Granulocytes: 0.02 10*3/uL (ref 0.00–0.07)
Basophils Absolute: 0.1 10*3/uL (ref 0.0–0.1)
Basophils Relative: 1 %
Eosinophils Absolute: 0.8 10*3/uL — ABNORMAL HIGH (ref 0.0–0.5)
Eosinophils Relative: 13 %
HCT: 38.5 % — ABNORMAL LOW (ref 39.0–52.0)
Hemoglobin: 12.7 g/dL — ABNORMAL LOW (ref 13.0–17.0)
Immature Granulocytes: 0 %
Lymphocytes Relative: 21 %
Lymphs Abs: 1.2 10*3/uL (ref 0.7–4.0)
MCH: 32.2 pg (ref 26.0–34.0)
MCHC: 33 g/dL (ref 30.0–36.0)
MCV: 97.5 fL (ref 80.0–100.0)
Monocytes Absolute: 0.6 10*3/uL (ref 0.1–1.0)
Monocytes Relative: 10 %
Neutro Abs: 3.1 10*3/uL (ref 1.7–7.7)
Neutrophils Relative %: 55 %
Platelets: 153 10*3/uL (ref 150–400)
RBC: 3.95 MIL/uL — ABNORMAL LOW (ref 4.22–5.81)
RDW: 14.6 % (ref 11.5–15.5)
WBC: 5.8 10*3/uL (ref 4.0–10.5)
nRBC: 0 % (ref 0.0–0.2)

## 2021-03-24 LAB — COMPREHENSIVE METABOLIC PANEL
ALT: 17 U/L (ref 0–44)
AST: 28 U/L (ref 15–41)
Albumin: 3.9 g/dL (ref 3.5–5.0)
Alkaline Phosphatase: 110 U/L (ref 38–126)
Anion gap: 7 (ref 5–15)
BUN: 13 mg/dL (ref 8–23)
CO2: 27 mmol/L (ref 22–32)
Calcium: 9.1 mg/dL (ref 8.9–10.3)
Chloride: 107 mmol/L (ref 98–111)
Creatinine, Ser: 0.89 mg/dL (ref 0.61–1.24)
GFR, Estimated: >60 mL/min (ref >60)
Glucose, Bld: 126 mg/dL — ABNORMAL HIGH (ref 70–99)
Potassium: 4 mmol/L (ref 3.5–5.1)
Sodium: 141 mmol/L (ref 135–145)
Total Bilirubin: 0.9 mg/dL (ref 0.3–1.2)
Total Protein: 7.5 g/dL (ref 6.5–8.1)

## 2021-03-24 LAB — PROTIME-INR
INR: 1.1 (ref 0.8–1.2)
Prothrombin Time: 13.7 seconds (ref 11.4–15.2)

## 2021-03-24 IMAGING — NM NM MISC PROCEDURE
3 series · 18 of 18 positions shown · non-contrast
Comparison: MRI [DATE]

CLINICAL DATA: History of colon cancer with unresectable liver
metastases.

EXAM:
NUCLEAR MEDICINE LIVER SCAN; ULTRASOUND MISCELLANEOUS SOFT TISSUE
TECHNIQUE: Abdominal images were obtained in multiple projections after
intrahepatic arterial injection of radiopharmaceutical. SPECT
imaging was performed. Lung shunt calculation was performed.
RADIOPHARMACEUTICALS:  [J0] MAA TECHNETIUM TO 99M ALBUMIN
AGGREGATED

[Series 1: ss-mc_(id)_tra · 4.1mm · 4.14mm/px · 6 of 128 frames shown]
[frame 11/128]
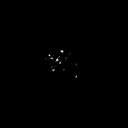
[frame 32/128]
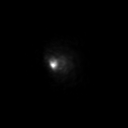
[frame 54/128]
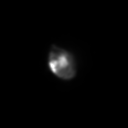
[frame 75/128]
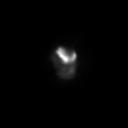
[frame 96/128]
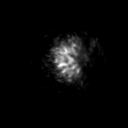
[frame 118/128]
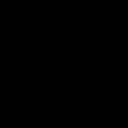

[Series 1: ss-mc_(id)_cor · 4.1mm · 4.14mm/px · 6 of 128 frames shown]
[frame 11/128]
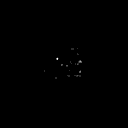
[frame 32/128]
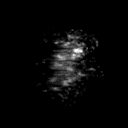
[frame 54/128]
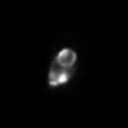
[frame 75/128]
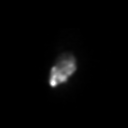
[frame 96/128]
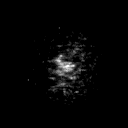
[frame 118/128]
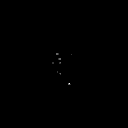

[Series 2: ss-mc · 4.14mm/px · 6 of 64 frames shown]
[frame 6/64]
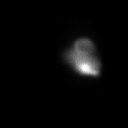
[frame 16/64]
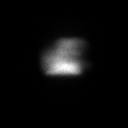
[frame 27/64]
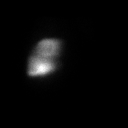
[frame 38/64]
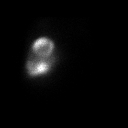
[frame 48/64]
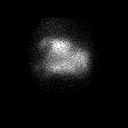
[frame 59/64]
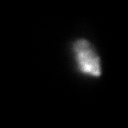

[18 of 18 positions shown; findings below may reference images not displayed]

FINDINGS: The injected microaggregated albumin localizes within the liver. No
evidence of activity within the stomach, duodenum, or bowel.

Calculated shunt fraction to the lungs equals 5.8%.
IMPRESSION: 1. No significant extrahepatic radiotracer activity following
intrahepatic arterial injection of MAA.
2. Lung shunt fraction equals 5.8%

## 2021-03-24 IMAGING — XA IR EMBO ARTERIAL NOT [PERSON_NAME] INC GUIDE ROADMAPPING
9 of 11 series · 13 of 24 positions shown · IV contrast (IODINE)
Comparison: none

INDICATION: 79-year-old male with right-sided colon cancer metastatic to the
liver. He has multifocal right hepatic metastatic lesions which have
been progressive on recent imaging. He presents today for hepatic
arteriography with possible embolization and technetium MAA
injection prior to definitive [AGE] radio therapy.

[Series 1: ir (id) (id)/(id) · 1 of 1 slices shown]
[im 1/1]
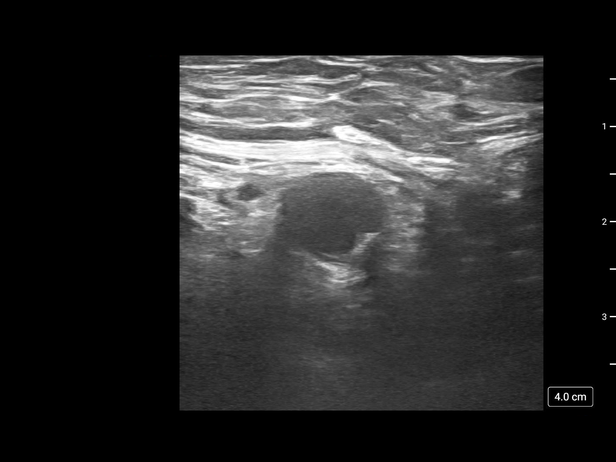

[Series 1: care body 4 · 1 of 17 frames shown (1 of 7)]
[frame 11/17]
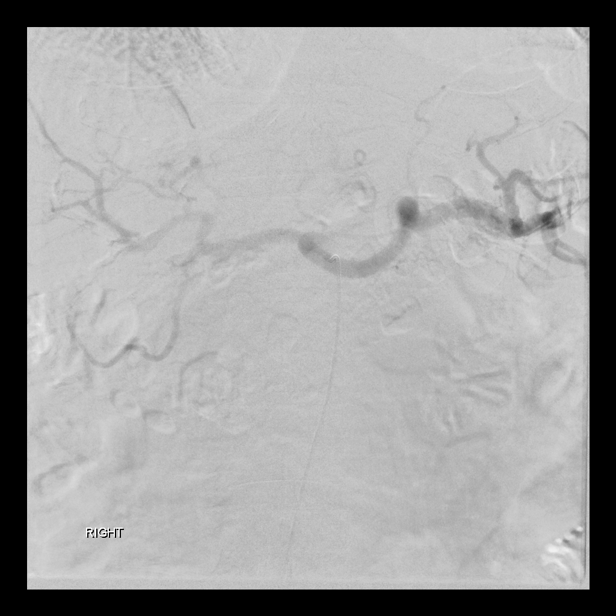

[Series 2: care body 4 · 1 of 59 frames shown (2 of 7)]
[frame 30/59]
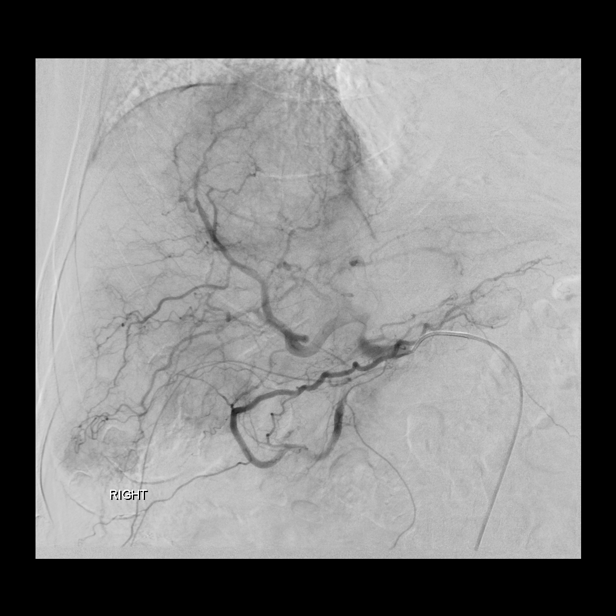

[Series 3: care body 4 · 2 of 34 frames shown (3 of 7)]
[frame 6/34]
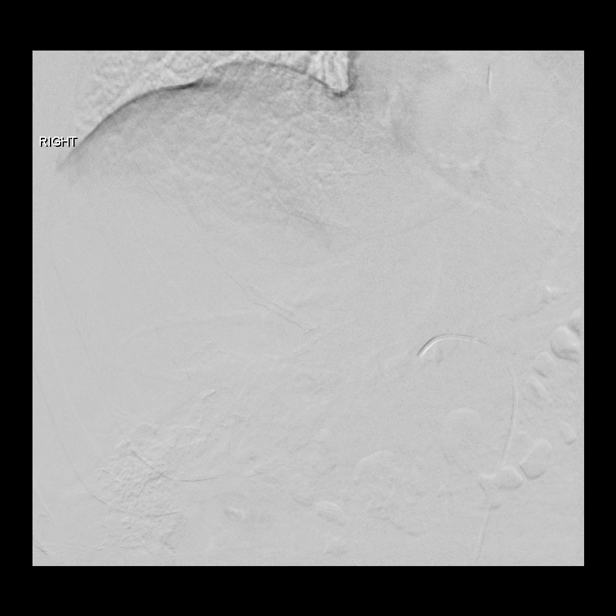
[frame 32/34]
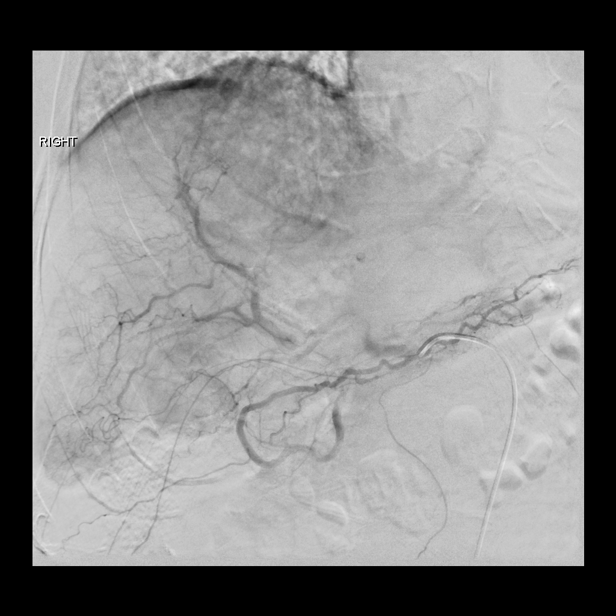

[Series 4: care body 4 · 1 of 37 frames shown (4 of 7)]
[frame 36/37]
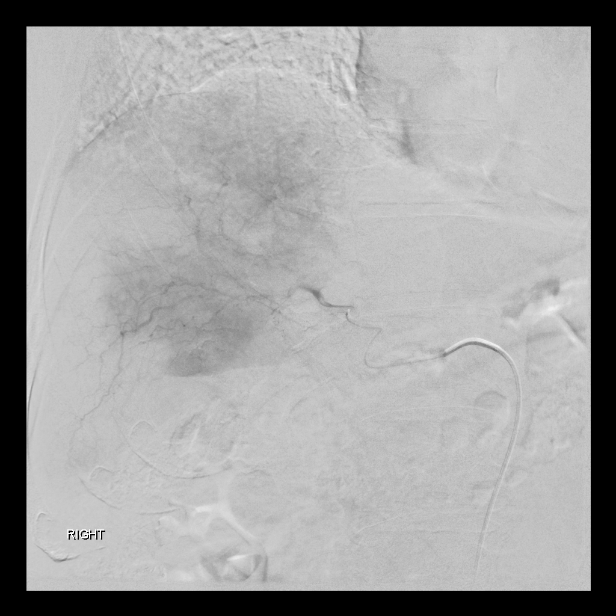

[Series 5: care body 4 · 2 of 48 frames shown (5 of 7)]
[frame 41/48]
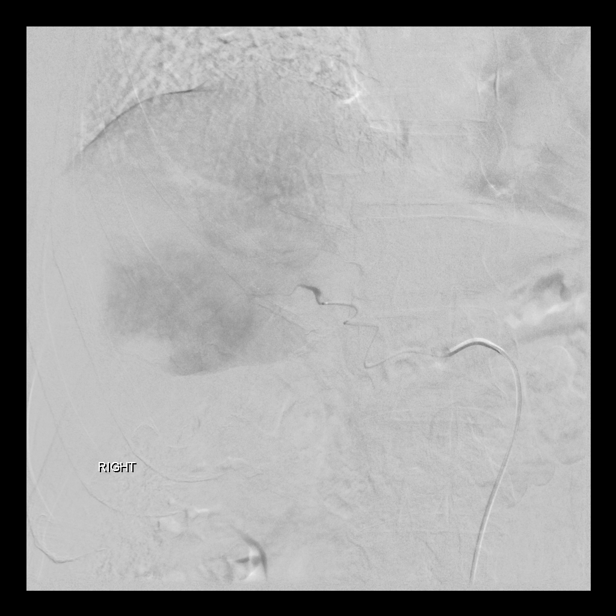
[frame 44/48]
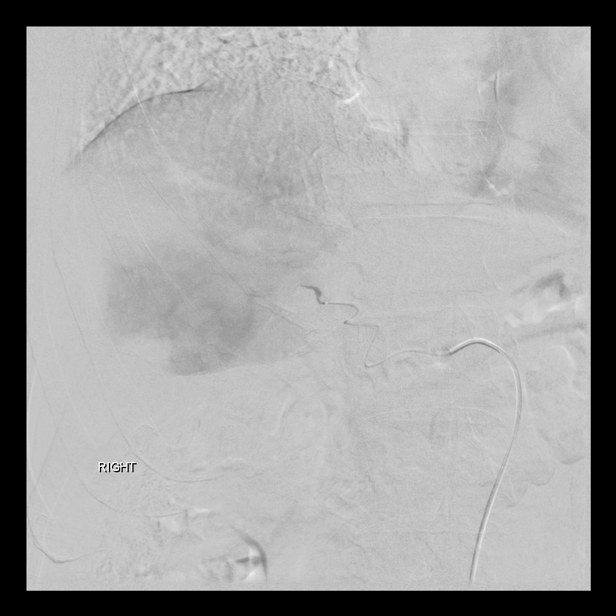

[Series 6: care body 4 · 1 of 47 frames shown (6 of 7)]
[frame 42/47]
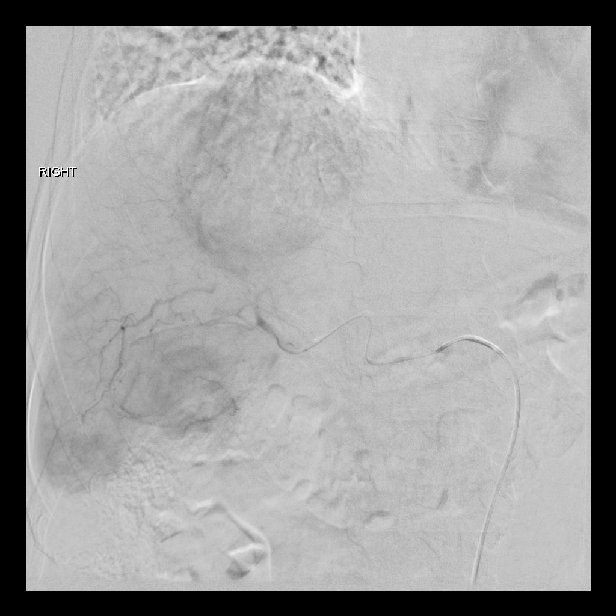

[Series 7: care body 4 · 1 of 15 frames shown (7 of 7)]
[frame 9/15]
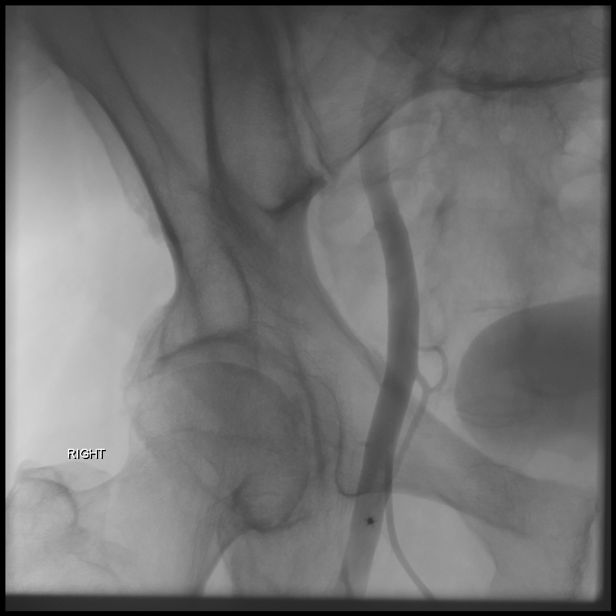

[Series 300: ld dsa body · 3 of 8 slices shown]
[im 1/8]
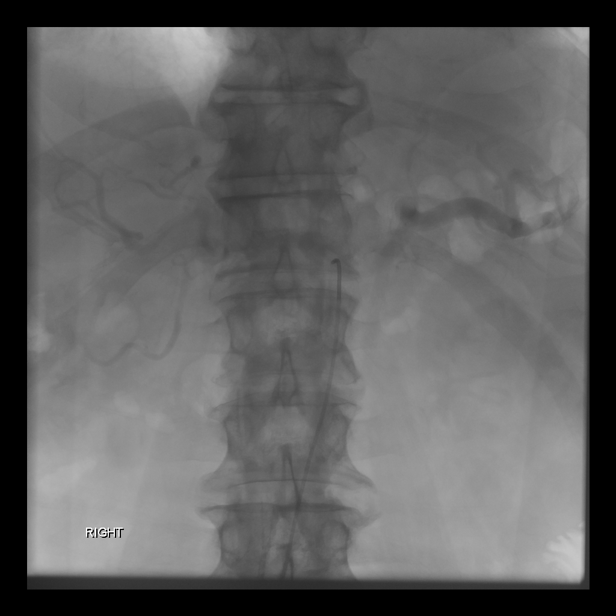
[im 5/8]
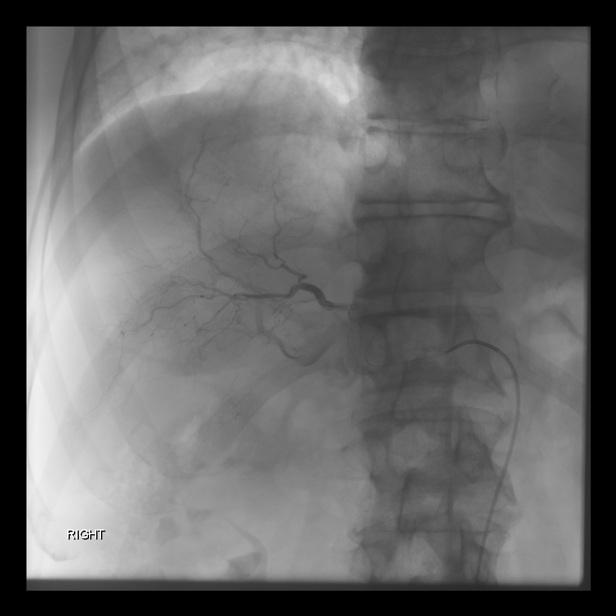
[im 8/8]
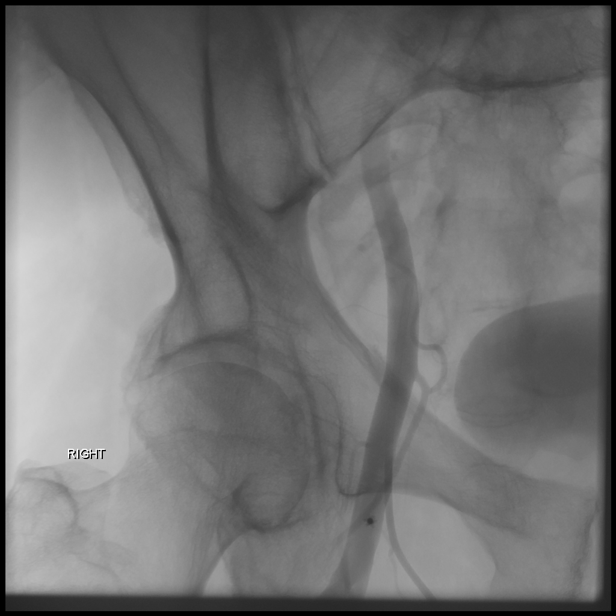

[13 of 24 positions shown; findings below may reference images not displayed]

EXAM:
IR EMBO ARTERIAL NOT HEMORR JOE INC GUIDE ROADMAPPING; IR
ULTRASOUND GUIDANCE VASC ACCESS RIGHT; SELECTIVE VISCERAL
ARTERIOGRAPHY; ADDITIONAL ARTERIOGRAPHY

MEDICATIONS:
None.

ANESTHESIA/SEDATION:
Moderate (conscious) sedation was employed during this procedure. A
total of Versed 2.5 mg and Fentanyl 100 mcg was administered
intravenously.

Moderate Sedation Time: 47 minutes. The patient's level of
consciousness and vital signs were monitored continuously by
radiology nursing throughout the procedure under my direct
supervision.

CONTRAST:  70mL OMNIPAQUE IOHEXOL 300 MG/ML  SOLN

FLUOROSCOPY TIME:  Fluoroscopy Time: 9 minutes 12 seconds (556 mGy).

COMPLICATIONS:
None immediate.



The right common femoral artery was interrogated with ultrasound and
found to be widely patent. An image was obtained and stored for the
medical record. Local anesthesia was attained by infiltration with
1% lidocaine. A small dermatotomy was made. Under real-time
sonographic guidance, the vessel was punctured with a 21 gauge
micropuncture needle. Using standard technique, the initial micro
needle was exchanged over a 0.018 micro wire for a transitional 4
French micro sheath. The micro sheath was then exchanged over a
0.035 wire for a 5 French vascular sheath.

A C2 cobra catheter was advanced over a Bentson wire into the
abdominal aorta and used to select the celiac artery. A celiac
arteriogram was performed. Conventional hepatic arterial anatomy.
The C2 cobra catheter was advanced over a Glidewire into the common
hepatic artery. Additional arteriography was performed in multiple
obliquities. Contrast injection confirms 3 large arterially
enhancing lesions in the right hemi liver consistent with the recent
prior MRI. No evidence of lesion in the left hepatic lobe.

A renegade STC microcatheter was advanced coaxially through the 5
French sheath using a Fathom 16 wire. The catheter was advanced into
the first branch artery arising from the right hepatic artery, this
appears to be the segment [DATE] branch. Contrast injection was
performed. However, there was reflux into the main right hepatic
artery. Therefore, the microcatheter was advanced more distally in
the segment [DATE] branch. Repeat contrast injection was performed. No
definitive supply of the arterially enhancing lesions arising from
this branch artery. Microcatheter was brought back and advanced into
the right main hepatic artery. Arteriography was performed.
Excellent visualization of the tumor blush of all 3 lesions. No
visceral branch arteries. No evidence of other arterial variance.
Technetium macro aggregated albumin was then injected. The catheter
system was removed and appropriately disposed of.

A limited right common femoral arteriogram was performed confirming
common femoral arterial access away from the previously placed Celt
ACD closure device. Hemostasis was attained again with the aid of a
second Celt ACD.
IMPRESSION: 1. Successful planning hepatic arteriogram prior to [AGE] trans
arterial radioembolization. Patient is a candidate for right semi
lobar embolization sparing segments 5 and 7.
2. Technetium macro gated albumin injection into the segment [DATE]
hepatic artery.

## 2021-03-24 IMAGING — NM NM LIVER IMG SPECT
2 series · 12 of 12 positions shown · non-contrast
Comparison: MRI [DATE]

CLINICAL DATA: History of colon cancer with unresectable liver
metastases.

EXAM:
NUCLEAR MEDICINE LIVER SCAN; ULTRASOUND MISCELLANEOUS SOFT TISSUE
TECHNIQUE: Abdominal images were obtained in multiple projections after
intrahepatic arterial injection of radiopharmaceutical. SPECT
imaging was performed. Lung shunt calculation was performed.
RADIOPHARMACEUTICALS:  [J0] MAA TECHNETIUM TO 99M ALBUMIN
AGGREGATED

[Series 1: ss-mc_(id)_cor · 4.1mm · 4.14mm/px · 6 of 128 frames shown]
[frame 11/128]
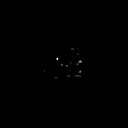
[frame 32/128]
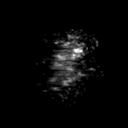
[frame 54/128]
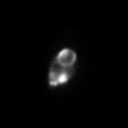
[frame 75/128]
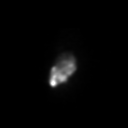
[frame 96/128]
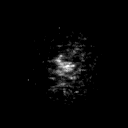
[frame 118/128]
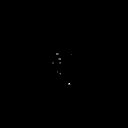

[Series 1: ss-mc_(id)_tra · 4.1mm · 4.14mm/px · 6 of 128 frames shown]
[frame 11/128]
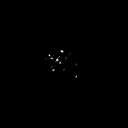
[frame 32/128]
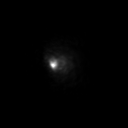
[frame 54/128]
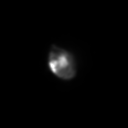
[frame 75/128]
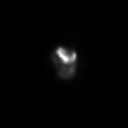
[frame 96/128]
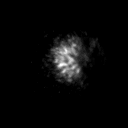
[frame 118/128]
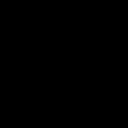

[12 of 12 positions shown; findings below may reference images not displayed]

FINDINGS: The injected microaggregated albumin localizes within the liver. No
evidence of activity within the stomach, duodenum, or bowel.

Calculated shunt fraction to the lungs equals 5.8%.
IMPRESSION: 1. No significant extrahepatic radiotracer activity following
intrahepatic arterial injection of MAA.
2. Lung shunt fraction equals 5.8%

## 2021-03-24 MED ORDER — TECHNETIUM TO 99M ALBUMIN AGGREGATED
4.4000 | Freq: Once | INTRAVENOUS | Status: AC | PRN
  Administered 2021-03-24: 4.4 via INTRAVENOUS

## 2021-03-24 MED ORDER — MIDAZOLAM HCL 2 MG/2ML IJ SOLN
INTRAMUSCULAR | Status: AC
  Filled 2021-03-24: qty 2

## 2021-03-24 MED ORDER — FENTANYL CITRATE (PF) 100 MCG/2ML IJ SOLN
INTRAMUSCULAR | Status: AC | PRN
  Administered 2021-03-24: 50 ug via INTRAVENOUS
  Administered 2021-03-24 (×2): 25 ug via INTRAVENOUS

## 2021-03-24 MED ORDER — LIDOCAINE HCL 1 % IJ SOLN
INTRAMUSCULAR | Status: AC
  Filled 2021-03-24: qty 20

## 2021-03-24 MED ORDER — SODIUM CHLORIDE 0.9 % IV SOLN: INTRAVENOUS | Status: DC

## 2021-03-24 MED ORDER — FENTANYL CITRATE (PF) 100 MCG/2ML IJ SOLN
INTRAMUSCULAR | Status: AC
  Filled 2021-03-24: qty 2

## 2021-03-24 MED ORDER — LIDOCAINE HCL 1 % IJ SOLN
INTRAMUSCULAR | Status: AC | PRN
  Administered 2021-03-24: 10 mL via INTRADERMAL

## 2021-03-24 MED ORDER — IOHEXOL 300 MG/ML  SOLN
100.0000 mL | Freq: Once | INTRAMUSCULAR | Status: AC | PRN
  Administered 2021-03-24: 70 mL via INTRA_ARTERIAL

## 2021-03-24 MED ORDER — MIDAZOLAM HCL 2 MG/2ML IJ SOLN
INTRAMUSCULAR | Status: AC | PRN
  Administered 2021-03-24: 1 mg via INTRAVENOUS
  Administered 2021-03-24 (×2): 0.5 mg via INTRAVENOUS

## 2021-03-24 NOTE — Sedation Documentation (Signed)
Patient transported to [facility] via stretcher by this RN.

## 2021-03-24 NOTE — H&P (Signed)
Referring Physician(s): [id number]  Supervising Physician: Jacqulynn Cadet  Patient Status:  [facility]  Chief Complaint: Metastatic colon cancer to liver   Subjective: Patient familiar to IR service from Port-A-Cath placement in 2020, bland embolization of segment 2 and 3 liver lesions on 12/19/2020, microwave ablation of left liver mass on 01/15/2021.  He has a history of metastatic colon cancer to the liver with recent MRI of the abdomen revealing:  1. Ablation site of the anterior left lobe of the liver demonstrates no residual contrast enhancement. 2. Marked interval enlargement of a rim enhancing, internally hypoenhancing metastatic lesion of the inferior right lobe of the liver, hepatic segment VI, measuring 3.3 x 2.8 cm, previously 1.1 cm. 3. New metastatic lesion just inferior, in the tip of the right lobe of the liver, hepatic segment VI, measuring 2.4 x 2.2 cm. 4. Redemonstrated lesion of the liver dome measuring approximately 7.0 x 6.8 cm. Although not changed in size and previously described as a simple cyst, this lesion demonstrates new internal complexity and substantial heterogeneous peripheral contrast enhancement not seen on prior examination, concerning for metastatic lesion. Note that this lesion was not present on examination dated 04/16/2018 and developed on examination dated 03/06/2019, not typical in behavior for a benign liver cyst.  Aortic Atherosclerosis   Following recent discussions with Dr. Laurence Ferrari he was deemed an appropriate candidate for hepatic Y 90 radioembolization and presents today for hepatic/visceral arterial roadmapping with possible embolization and Y 90 test dosing.  He denies fever, headache, chest pain, dyspnea, cough, abdominal pain, back pain, nausea, vomiting or bleeding.  He does some occasional right shoulder pain.  Past Medical History:  Diagnosis Date  . Arthritis   . Cancer ([place])    colon cancer 02/2019 per pt   . Family  history of adverse reaction to anesthesia    cousin took all night to wake up from anesthesia  . H/O colon cancer, stage IV   . Hyperlipemia   . Neuromuscular disorder ([place])   . Pre-diabetes    Past Surgical History:  Procedure Laterality Date  . COLON SURGERY    . ESOPHAGOGASTRODUODENOSCOPY (EGD) WITH PROPOFOL N/A 12/26/2020   Procedure: ESOPHAGOGASTRODUODENOSCOPY (EGD) WITH PROPOFOL;  Surgeon: [name], MD;  Location: [facility] ENDOSCOPY;  Service: Gastroenterology;  Laterality: N/A;  . IR ANGIOGRAM SELECTIVE EACH ADDITIONAL VESSEL  12/19/2020  . IR ANGIOGRAM VISCERAL SELECTIVE  12/19/2020  . IR EMBO TUMOR ORGAN ISCHEMIA INFARCT INC GUIDE ROADMAPPING  12/19/2020  . IR IMAGING GUIDED PORT INSERTION  07/20/2019  . IR RADIOLOGIST EVAL & MGMT  12/05/2020  . IR RADIOLOGIST EVAL & MGMT  02/11/2021  . IR RADIOLOGIST EVAL & MGMT  03/04/2021  . IR US GUIDE VASC ACCESS RIGHT  12/19/2020  . JOINT REPLACEMENT Left 2010   Knee  . RADIOLOGY WITH ANESTHESIA N/A 01/15/2021   Procedure: CT WITH ANESTHESIA MICROWAVE ABLATION OF LIVER;  Surgeon: Criselda Peaches, MD;  Location: [facility] ORS;  Service: Anesthesiology;  Laterality: N/A;     Allergies: Ace inhibitors  Medications: Prior to Admission medications   Medication Sig Start Date End Date Taking? Authorizing Provider  oxyCODONE (OXY IR/ROXICODONE) 5 MG immediate release tablet Take 1 tablet (5 mg total) by mouth every 6 (six) hours as needed for severe pain. 01/27/21  Yes Borders, Kirt Boys, NP  pravastatin (PRAVACHOL) 40 MG tablet Take 40 mg by mouth daily.    Yes [provider]  sucralfate (CARAFATE) 1 g tablet TAKE 1 TABLET (1 G  TOTAL) BY MOUTH 4 (FOUR) TIMES DAILY - WITH MEALS AND AT BEDTIME. 03/05/21  Yes Cammie Sickle, MD  tamsulosin (FLOMAX) 0.4 MG CAPS capsule Take 1 capsule (0.4 mg total) by mouth daily. 01/27/21 01/27/22 Yes Borders, Kirt Boys, NP  ondansetron (ZOFRAN) 4 MG tablet Take 1 tablet (4 mg total) by mouth every 8 (eight) hours  as needed for nausea or vomiting. Patient not taking: Reported on 02/24/2021 01/15/21   Ardis Rowan, PA-C  ondansetron (ZOFRAN) 8 MG tablet Take 1 tablet (8 mg total) by mouth every 8 (eight) hours as needed for nausea or vomiting. Patient not taking: Reported on 02/24/2021 12/19/20   Ronney Lion, PA-C  pantoprazole (PROTONIX) 40 MG tablet Take 1 tablet (40 mg total) by mouth 2 (two) times daily. Patient not taking: No sig reported 11/27/20   Cammie Sickle, MD  prochlorperazine (COMPAZINE) 10 MG tablet Take 1 tablet (10 mg total) by mouth every 6 (six) hours as needed for nausea or vomiting. Patient not taking: No sig reported 11/21/20   Borders, Kirt Boys, NP     Vital Signs: Blood pressure 148/83, heart rate 66, respirations 18, temp 98.3 ,O2 sat 100% room air  Ht 6' (1.829 m)   Wt 180 lb (81.6 kg)   BMI 24.41 kg/m   Physical Exam awake, alert.  Chest clear to auscultation bilaterally.  Heart with normal rate, occasional ectopy noted.  Abdomen soft, positive bowel sounds, nontender.  No lower extremity edema.  Imaging: No results found.  Labs:  CBC: Recent Labs    12/31/20 0928 01/06/21 1340 01/15/21 0739 01/28/21 1002  WBC 4.9 6.1 5.2 5.3  HGB 11.4* 12.7* 12.5* 11.8*  HCT 34.2* 40.0 38.1* 35.5*  PLT 183 203 150 163    COAGS: Recent Labs    12/19/20 0954 01/15/21 0739  INR 1.0 0.9    BMP: Recent Labs    06/10/20 0858 06/25/20 0841 07/08/20 0843 07/22/20 0836 08/05/20 0821 12/31/20 0928 01/06/21 1340 01/15/21 0739 01/28/21 1002  NA 138 138 139 138   < > 139 140 139 139  K 3.7 3.6 3.6 3.6   < > 3.6 4.3 4.0 3.8  CL 107 108 108 107   < > 106 106 106 105  CO2 25 24 25 25    < > 26 26 25 27   GLUCOSE 205* 201* 209* 195*   < > 168* 96 117* 119*  BUN 11 10 9 10    < > 8 11 12 11   CALCIUM 8.2* 8.0* 8.0* 8.1*   < > 8.1* 8.8* 8.6* 8.6*  CREATININE 0.89 0.80 0.70 0.89   < > 0.77 0.64 0.82 0.75  GFRNONAA >60 >60 >60 >60   < > >60 >60 >60 >60  GFRAA >60  >60 >60 >60  --   --   --   --   --    < > = values in this interval not displayed.    LIVER FUNCTION TESTS: Recent Labs    12/19/20 0954 12/31/20 0928 01/15/21 0739 01/28/21 1002  BILITOT 1.2 0.8 0.7 0.5  AST 34 28 43* 30  ALT 20 17 25 19   ALKPHOS 118 118 176* 119  PROT 6.9 6.6 7.7 7.0  ALBUMIN 3.0* 2.7* 3.5 3.3*    Assessment and Plan: Patient familiar to IR service from Port-A-Cath placement in 2020, bland embolization of segment 2 and 3 liver lesions on 12/19/2020, microwave ablation of left liver mass on 01/15/2021.  He has a history  of metastatic colon cancer to the liver with recent MRI of the abdomen revealing:  1. Ablation site of the anterior left lobe of the liver demonstrates no residual contrast enhancement. 2. Marked interval enlargement of a rim enhancing, internally hypoenhancing metastatic lesion of the inferior right lobe of the liver, hepatic segment VI, measuring 3.3 x 2.8 cm, previously 1.1 cm. 3. New metastatic lesion just inferior, in the tip of the right lobe of the liver, hepatic segment VI, measuring 2.4 x 2.2 cm. 4. Redemonstrated lesion of the liver dome measuring approximately 7.0 x 6.8 cm. Although not changed in size and previously described as a simple cyst, this lesion demonstrates new internal complexity and substantial heterogeneous peripheral contrast enhancement not seen on prior examination, concerning for metastatic lesion. Note that this lesion was not present on examination dated 04/16/2018 and developed on examination dated 03/06/2019, not typical in behavior for a benign liver cyst.  Aortic Atherosclerosis   Following recent discussions with Dr. Laurence Ferrari he was deemed an appropriate candidate for hepatic Y 90 radioembolization and presents today for hepatic/visceral arterial roadmapping with possible embolization and Y 90 test dosing.Risks and benefits of procedure were discussed with the patient including, but not limited to  bleeding, infection, vascular injury or contrast induced renal failure.  This interventional procedure involves the use of X-rays and because of the nature of the planned procedure, it is possible that we will have prolonged use of X-ray fluoroscopy.  Potential radiation risks to you include (but are not limited to) the following: - A slightly elevated risk for cancer  several years later in life. This risk is typically less than 0.5% percent. This risk is low in comparison to the normal incidence of human cancer, which is 33% for women and 50% for men according to the [employer]. - Radiation induced injury can include skin redness, resembling a rash, tissue breakdown / ulcers and hair loss (which can be temporary or permanent).   The likelihood of either of these occurring depends on the difficulty of the procedure and whether you are sensitive to radiation due to previous procedures, disease, or genetic conditions.   IF your procedure requires a prolonged use of radiation, you will be notified and given written instructions for further action.  It is your responsibility to monitor the irradiated area for the 2 weeks following the procedure and to notify your physician if you are concerned that you have suffered a radiation induced injury.    All of the patient's questions were answered, patient is agreeable to proceed.  Consent signed and in chart.      Electronically Signed: D. Rowe Robert, PA-C 03/24/2021, 8:42 AM   I spent a total of 25 minutes at the the patient's bedside AND on the patient's hospital floor or unit, greater than 50% of which was counseling/coordinating care for visceral/hepatic arteriogram with possible embolization/test Y 90 dosing

## 2021-03-24 NOTE — Discharge Instructions (Signed)
Please call Interventional Radiology clinic [phone] with any questions or concerns.  You may remove your dressing and shower tomorrow.   Hepatic Artery Radioembolization, Care After This sheet gives you information about how to care for yourself after your procedure. Your health care provider may also give you more specific instructions. If you have problems or questions, contact your health care provider. What can I expect after the procedure? After the procedure, it is common to have:  A slight fever for 1-2 weeks. If your fever gets worse, tell your health care provider.  Tiredness (fatigue).  Loss of appetite. This should gradually improve after about 1 week.  Abdominal pain on your right side.  Soreness and tenderness in your groin area where the needle and catheter were placed (puncture site). Follow these instructions at home: Puncture site care  Follow instructions from your health care provider about how to take care of the puncture site. Make sure you: ? Wash your hands with soap and water for at least 20 seconds before and after you change your bandage (dressing). If soap and water are not available, use hand sanitizer. ? Change your dressing as told by your health care provider. ? Leave stitches (sutures), skin glue, or adhesive strips in place. These skin closures may need to stay in place for 2 weeks or longer. If adhesive strip edges start to loosen and curl up, you may trim the loose edges. Do not remove adhesive strips completely unless your health care provider tells you to do that.  Check your puncture site every day for signs of infection. Check for: ? More redness, swelling, or pain. ? Fluid or blood. ? Warmth. ? Pus or a bad smell.   Activity  Rest as told by your health care provider.  Avoid sitting for a long time without moving. Get up to take short walks every 1-2 hours. This is important to improve blood flow and breathing. Ask for help if you feel  weak or unsteady.  Return to your normal activities as told by your health care provider. Ask your health care provider what activities are safe for you.  If you were given a sedative during the procedure, it can affect you for several hours. Do not drive or operate machinery until your health care provider says that it is safe.  Do not lift anything that is heavier than 10 lb (4.5 kg), or the limit that you are told, until your health care provider says that it is safe. Medicines  Take over-the-counter and prescription medicines only as told by your health care provider.  Ask your health care provider if the medicine prescribed to you: ? Requires you to avoid driving or using machinery. ? Can cause constipation. You may need to take these actions to prevent or treat constipation:  Drink enough fluid to keep your urine pale yellow.  Take over-the-counter or prescription medicines.  Eat foods that are high in fiber, such as beans, whole grains, and fresh fruits and vegetables.  Limit foods that are high in fat and processed sugars, such as fried or sweet foods. Radiation precautions For up to a week after your procedure, there will be a small amount of radioactivity near your liver. This is not especially dangerous to other people. However, as told by your health care provider, you should follow these precautions for 7 days:  Do not come in close contact with people.  Do not sleep in the same bed as someone else.  Do not hold  children or babies.  Do not have contact with pregnant women. General instructions  Eat frequent, small meals until your appetite returns. Follow instructions from your health care provider about eating or drinking restrictions.  Do not take baths, swim, or use a hot tub until your health care provider approves. You may take showers. Wash your puncture site with mild soap and water, and pat the area dry.  Wear compression stockings as told by your health  care provider. These stockings help to prevent blood clots and reduce swelling in your legs.  Keep all follow-up visits as told by your health care provider. This is important. You may need to have blood tests and imaging tests done.   Contact a health care provider if:  You have any of these signs of infection: ? More redness, swelling, or pain around your puncture site. ? Fluid or blood coming from your puncture site. ? Warmth coming from your puncture site. ? Pus or a bad smell coming from your puncture site.  You have pain that: ? Gets worse. ? Does not get better with medicine. ? Feels like very bad heartburn. ? Is in the middle of your abdomen, above your belly button.  You have any signs of infection or liver failure, such as: ? Your skin or the white parts of your eyes turn yellow (jaundice). ? The color of your urine changes to dark brown. ? The color of your stool (feces) changes to light yellow. ? Your abdominal measurement (girth) increases in a short period of time. ? You gain more than 5 lb (2.3 kg) in a short period of time. Get help right away if you:  Have a fever that lasts longer than 2 weeks or is higher than what your health care provider told you to expect.  Develop any of the following in your legs: ? Pain. ? Swelling. ? Skin that is cold or pale or turns blue.  Have chest pain.  Have blood in your vomit, saliva, or stool.  Have trouble breathing. These symptoms may represent a serious problem that is an emergency. Do not wait to see if the symptoms will go away. Get medical help right away. Call your local emergency services (911 in the [place] Do not drive yourself to the hospital. Summary  After the procedure, it is common to have a slight fever for 1-2 weeks, tiredness, loss of appetite, abdominal pain on the right side, and groin tenderness where the catheter was placed.  For up to a week after your procedure, as told by your health care provider,  do not come in close contact with people.  Follow instructions from your health care provider about how to take care of the puncture site.  Contact a health care provider if you have any signs of infection.  Get help right away if you develop pain or swelling in your legs or if your legs feel cool or look pale. This information is not intended to replace advice given to you by your health care provider. Make sure you discuss any questions you have with your health care provider. Document Revised: 07/24/2019 Document Reviewed: 07/24/2019 Elsevier Patient Education  2021 [place] Y-90 Radioembolization Discharge Instructions  You have been given a radioactive material during your procedure.  While it is safe for you to be discharged home from the hospital, you need to proceed directly home.    Do not use public transportation, including air travel, lasting more than 2 hours for  1 week.  Avoid crowded public places for 1 week.  Adult visitors should try to avoid close contact with you for 1 week.    Children and pregnant females should not visit or have close contact with you for 1 week.  Items that you touch are not radioactive.  Do not sleep in the same bed as your partner for 1 week, and a condom should be used for sexual activity during the first 24 hours.  Your blood may be radioactive and caution should be used if any bleeding occurs during the recovery period.  Body fluids may be radioactive for 24 hours.  Wash your hands after voiding.  Men should sit to urinate.  Dispose of any soiled materials (flush down toilet or place in trash at home) during the first day.  Drink 6 to 8 glasses of fluids per day for 5 days to hydrate yourself.  If you need to see a doctor during the first week, you must let them know that you were treated with yttrium-90 microspheres, and will be slightly radioactive.  They can call Interventional Radiology [phone] with any  questions.   Moderate Conscious Sedation, Adult, Care After This sheet gives you information about how to care for yourself after your procedure. Your health care provider may also give you more specific instructions. If you have problems or questions, contact your health care provider. What can I expect after the procedure? After the procedure, it is common to have:  Sleepiness for several hours.  Impaired judgment for several hours.  Difficulty with balance.  Vomiting if you eat too soon. Follow these instructions at home: For the time period you were told by your health care provider:  Rest.  Do not participate in activities where you could fall or become injured.  Do not drive or use machinery.  Do not drink alcohol.  Do not take sleeping pills or medicines that cause drowsiness.  Do not make important decisions or sign legal documents.  Do not take care of children on your own.    _  Eating and drinking  Follow the diet recommended by your health care provider.  Drink enough fluid to keep your urine pale yellow.  If you vomit: ? Drink water, juice, or soup when you can drink without vomiting. ? Make sure you have little or no nausea before eating solid foods.   General instructions  Take over-the-counter and prescription medicines only as told by your health care provider.  Have a responsible adult stay with you for the time you are told. It is important to have someone help care for you until you are awake and alert.  Do not smoke.  Keep all follow-up visits as told by your health care provider. This is important. Contact a health care provider if:  You are still sleepy or having trouble with balance after 24 hours.  You feel light-headed.  You keep feeling nauseous or you keep vomiting.  You develop a rash.  You have a fever.  You have redness or swelling around the IV site. Get help right away if:  You have trouble breathing.  You have  new-onset confusion at home. Summary  After the procedure, it is common to feel sleepy, have impaired judgment, or feel nauseous if you eat too soon.  Rest after you get home. Know the things you should not do after the procedure.  Follow the diet recommended by your health care provider and drink enough fluid to keep your urine  pale yellow.  Get help right away if you have trouble breathing or new-onset confusion at home. This information is not intended to replace advice given to you by your health care provider. Make sure you discuss any questions you have with your health care provider. Document Revised: 02/02/2020 Document Reviewed: 08/31/2019 Elsevier Patient Education  2021 [employer].

## 2021-03-24 NOTE — Procedures (Signed)
Interventional Radiology Procedure Note  Procedure: Pre Y90 planning study with Tc-MAA infusion.   Complications: None  Estimated Blood Loss: None  Recommendations: - To [facility] for lung-shunt study - DC home in 1 hr   Signed,  Criselda Peaches, MD

## 2021-04-01 ENCOUNTER — Encounter: Payer: Self-pay | Admitting: Internal Medicine

## 2021-04-01 ENCOUNTER — Inpatient Hospital Stay: Payer: Medicare HMO | Attending: Internal Medicine

## 2021-04-01 ENCOUNTER — Inpatient Hospital Stay (HOSPITAL_BASED_OUTPATIENT_CLINIC_OR_DEPARTMENT_OTHER): Payer: Medicare HMO | Admitting: Internal Medicine

## 2021-04-01 ENCOUNTER — Other Ambulatory Visit: Payer: Self-pay

## 2021-04-01 VITALS — BP 139/71 | HR 61 | Temp 96.6°F | Resp 20 | Ht 72.0 in | Wt 186.0 lb

## 2021-04-01 DIAGNOSIS — K279 Peptic ulcer, site unspecified, unspecified as acute or chronic, without hemorrhage or perforation: Secondary | ICD-10-CM | POA: Insufficient documentation

## 2021-04-01 DIAGNOSIS — C787 Secondary malignant neoplasm of liver and intrahepatic bile duct: Secondary | ICD-10-CM | POA: Insufficient documentation

## 2021-04-01 DIAGNOSIS — M25519 Pain in unspecified shoulder: Secondary | ICD-10-CM | POA: Insufficient documentation

## 2021-04-01 DIAGNOSIS — T451X5A Adverse effect of antineoplastic and immunosuppressive drugs, initial encounter: Secondary | ICD-10-CM | POA: Diagnosis not present

## 2021-04-01 DIAGNOSIS — Z95828 Presence of other vascular implants and grafts: Secondary | ICD-10-CM

## 2021-04-01 DIAGNOSIS — C182 Malignant neoplasm of ascending colon: Secondary | ICD-10-CM

## 2021-04-01 DIAGNOSIS — Z9221 Personal history of antineoplastic chemotherapy: Secondary | ICD-10-CM | POA: Diagnosis not present

## 2021-04-01 DIAGNOSIS — F17210 Nicotine dependence, cigarettes, uncomplicated: Secondary | ICD-10-CM | POA: Insufficient documentation

## 2021-04-01 DIAGNOSIS — Z79891 Long term (current) use of opiate analgesic: Secondary | ICD-10-CM | POA: Insufficient documentation

## 2021-04-01 DIAGNOSIS — G8929 Other chronic pain: Secondary | ICD-10-CM | POA: Insufficient documentation

## 2021-04-01 DIAGNOSIS — G620 Drug-induced polyneuropathy: Secondary | ICD-10-CM | POA: Diagnosis not present

## 2021-04-01 LAB — CBC WITH DIFFERENTIAL/PLATELET
Abs Immature Granulocytes: 0.02 10*3/uL (ref 0.00–0.07)
Basophils Absolute: 0.1 10*3/uL (ref 0.0–0.1)
Basophils Relative: 1 %
Eosinophils Absolute: 0.7 10*3/uL — ABNORMAL HIGH (ref 0.0–0.5)
Eosinophils Relative: 11 %
HCT: 35 % — ABNORMAL LOW (ref 39.0–52.0)
Hemoglobin: 12.1 g/dL — ABNORMAL LOW (ref 13.0–17.0)
Immature Granulocytes: 0 %
Lymphocytes Relative: 20 %
Lymphs Abs: 1.2 10*3/uL (ref 0.7–4.0)
MCH: 32.9 pg (ref 26.0–34.0)
MCHC: 34.6 g/dL (ref 30.0–36.0)
MCV: 95.1 fL (ref 80.0–100.0)
Monocytes Absolute: 0.5 10*3/uL (ref 0.1–1.0)
Monocytes Relative: 8 %
Neutro Abs: 3.6 10*3/uL (ref 1.7–7.7)
Neutrophils Relative %: 60 %
Platelets: 148 10*3/uL — ABNORMAL LOW (ref 150–400)
RBC: 3.68 MIL/uL — ABNORMAL LOW (ref 4.22–5.81)
RDW: 14.1 % (ref 11.5–15.5)
WBC: 6.1 10*3/uL (ref 4.0–10.5)
nRBC: 0 % (ref 0.0–0.2)

## 2021-04-01 LAB — COMPREHENSIVE METABOLIC PANEL
ALT: 16 U/L (ref 0–44)
AST: 27 U/L (ref 15–41)
Albumin: 3.3 g/dL — ABNORMAL LOW (ref 3.5–5.0)
Alkaline Phosphatase: 111 U/L (ref 38–126)
Anion gap: 8 (ref 5–15)
BUN: 13 mg/dL (ref 8–23)
CO2: 25 mmol/L (ref 22–32)
Calcium: 8.5 mg/dL — ABNORMAL LOW (ref 8.9–10.3)
Chloride: 102 mmol/L (ref 98–111)
Creatinine, Ser: 0.88 mg/dL (ref 0.61–1.24)
GFR, Estimated: >60 mL/min (ref >60)
Glucose, Bld: 216 mg/dL — ABNORMAL HIGH (ref 70–99)
Potassium: 3.6 mmol/L (ref 3.5–5.1)
Sodium: 135 mmol/L (ref 135–145)
Total Bilirubin: 0.7 mg/dL (ref 0.3–1.2)
Total Protein: 6.9 g/dL (ref 6.5–8.1)

## 2021-04-01 MED ORDER — HEPARIN SOD (PORK) LOCK FLUSH 100 UNIT/ML IV SOLN
500.0000 [IU] | Freq: Once | INTRAVENOUS | Status: AC
  Administered 2021-04-01: 500 [IU] via INTRAVENOUS
  Filled 2021-04-01: qty 5

## 2021-04-01 MED ORDER — HEPARIN SOD (PORK) LOCK FLUSH 100 UNIT/ML IV SOLN
INTRAVENOUS | Status: AC
  Filled 2021-04-01: qty 5

## 2021-04-01 MED ORDER — SODIUM CHLORIDE 0.9% FLUSH
10.0000 mL | Freq: Once | INTRAVENOUS | Status: AC
  Administered 2021-04-01: 10 mL via INTRAVENOUS
  Filled 2021-04-01: qty 10

## 2021-04-01 NOTE — Progress Notes (Signed)
[place] NOTE  Patient Care Team: Sofie Hartigan, MD as PCP - General (Family Medicine) Cammie Sickle, MD as Consulting Physician (Hematology and Oncology)  CHIEF COMPLAINTS/PURPOSE OF CONSULTATION: Colon cancer  #  Oncology History Overview Note  # MAY 2020- 3. 03/09/19 Liver biopsy. Microscopic examination shows malignant cells with glandular architecture consistent with adenocarcinoma. The malignant cells are positive for CK20 and CDX-2. These findings support the clinical impression of metastatic colon adenocarcinoma. 4. 03/10/19 R hemicolectomy. Tumor site cecum. Adenocarcinoma. Mucinous features present. G2. No tumor deposits. Invades visceral peritoneum. No tumor perforation. LVI present. PNI not identified. All margins uninvolved. 1/12 LNs. PT4apN1. Periappendiceal inflammation c/w resolving abscess. Microsatellite stable (MSS). [Dr.Mettu; [facility]]  # SEP 4th 2020 [compared to May 2020]  Interval increase in size of the metastases to the hepatic dome, The metastasis to the left hepatic lobe is unchanged; 2.  New subcentimeter hypoattenuating lesion in the inferior right hepatic lobe, incompletely characterized on CT. 3.  Postsurgical changes following right hemicolectomy.  # OCT 2020- FOLFOX +avastin; CT dec 22nd 2020- [compared to [facility] sep 9th 2020]-Liver- slight progression versus stable disease; CT scan SEP 4th 2021- Progressive disease-left lower lobe lung nodule 8 mm [previously 4 mm]; increase in size of the hepatic metastases by few millimeters.;  Soft tissue nodule adjacent to anastomotic site again increased by few millimeters.STOP FOLFOX; cont avastin  #  SEP 20th, 2021- FOLFIRI+ AVASTIN; HOLD FEB 2022- sec to poor toelrance [peptic ulcer]; 3/30-liver ablation-   [bland embolization of segment 2/3 liver lesion on 12/19/2020;  microwave ablation of both liver lesions.  # FEB /16-EGD [Dr.Anna-duodenal ulcer-? meloxicam]  # JAN 2022- COVID [s/p Mab  infusion; pills; skin rash resolved.]  # NGS/F-ONE-MUTATED K-RAS [G]  # PALLIATIVE CARE EVALUATION: 09/20/2019-[name]  # PAIN MANAGEMENT: NA   DIAGNOSIS: COLON CANCER  STAGE:  IV     ;  GOALS:Palliative  CURRENT/MOST RECENT THERAPY : FOLFIRI+ avastin [C]    Cancer of right colon ([place])  07/05/2019 Initial Diagnosis   Cancer of right colon ([place])   07/24/2019 - 06/25/2020 Chemotherapy   The patient had dexamethasone (DECADRON) 4 MG tablet, 8 mg, Oral, Daily, 1 of 1 cycle, Start date: --, End date: -- palonosetron (ALOXI) injection 0.25 mg, 0.25 mg, Intravenous,  Once, 23 of 25 cycles Administration: 0.25 mg (07/24/2019), 0.25 mg (08/07/2019), 0.25 mg (08/21/2019), 0.25 mg (09/04/2019), 0.25 mg (09/20/2019), 0.25 mg (10/04/2019), 0.25 mg (10/16/2019), 0.25 mg (10/30/2019), 0.25 mg (11/22/2019), 0.25 mg (12/06/2019), 0.25 mg (12/20/2019), 0.25 mg (01/03/2020), 0.25 mg (01/29/2020), 0.25 mg (02/12/2020), 0.25 mg (02/26/2020), 0.25 mg (03/11/2020), 0.25 mg (03/25/2020), 0.25 mg (04/08/2020), 0.25 mg (04/24/2020), 0.25 mg (05/08/2020), 0.25 mg (05/22/2020), 0.25 mg (06/10/2020), 0.25 mg (06/25/2020) leucovorin 800 mg in dextrose 5 % 250 mL infusion, 844 mg, Intravenous,  Once, 23 of 25 cycles Administration: 800 mg (07/24/2019), 800 mg (08/07/2019), 800 mg (08/21/2019), 800 mg (09/04/2019), 800 mg (09/20/2019), 800 mg (10/04/2019), 800 mg (10/16/2019), 800 mg (10/30/2019), 800 mg (11/22/2019), 800 mg (12/06/2019), 800 mg (12/20/2019), 800 mg (01/03/2020), 800 mg (01/29/2020), 800 mg (02/12/2020), 800 mg (02/26/2020), 800 mg (03/11/2020), 800 mg (03/25/2020), 800 mg (04/08/2020), 800 mg (04/24/2020), 800 mg (05/08/2020), 800 mg (05/22/2020), 800 mg (06/10/2020), 800 mg (06/25/2020) oxaliplatin (ELOXATIN) 180 mg in dextrose 5 % 500 mL chemo infusion, 85 mg/m2 = 180 mg, Intravenous,  Once, 23 of 25 cycles Dose modification: 178 mg (original dose 85 mg/m2, Cycle 17, Reason: Other (see comments), Comment: insurance adjusted  dose ) Administration: 180 mg  (07/24/2019), 180 mg (08/07/2019), 180 mg (08/21/2019), 180 mg (09/04/2019), 180 mg (09/20/2019), 180 mg (10/04/2019), 180 mg (10/16/2019), 180 mg (10/30/2019), 180 mg (11/22/2019), 180 mg (12/06/2019), 180 mg (12/20/2019), 180 mg (01/03/2020), 180 mg (01/29/2020), 180 mg (02/12/2020), 180 mg (02/26/2020), 180 mg (03/11/2020), 180 mg (04/08/2020), 180 mg (04/24/2020), 180 mg (05/08/2020), 180 mg (05/22/2020), 180 mg (06/10/2020), 180 mg (06/25/2020) fluorouracil (ADRUCIL) 5,000 mg in sodium chloride 0.9 % 150 mL chemo infusion, 5,050 mg, Intravenous, 1 Day/Dose, 23 of 25 cycles Administration: 5,000 mg (07/24/2019), 5,000 mg (08/07/2019), 5,000 mg (08/21/2019), 5,050 mg (09/04/2019), 5,000 mg (10/04/2019), 5,000 mg (10/16/2019), 5,000 mg (11/22/2019), 5,000 mg (12/06/2019), 5,000 mg (12/20/2019), 5,000 mg (01/03/2020), 5,000 mg (01/29/2020), 5,000 mg (02/12/2020), 5,000 mg (02/26/2020), 5,000 mg (03/11/2020), 5,000 mg (03/25/2020), 5,000 mg (04/08/2020), 5,000 mg (04/24/2020), 5,000 mg (05/08/2020), 5,000 mg (05/22/2020), 5,000 mg (06/10/2020), 5,000 mg (06/25/2020) bevacizumab-bvzr (ZIRABEV) 400 mg in sodium chloride 0.9 % 100 mL chemo infusion, 5 mg/kg = 400 mg, Intravenous,  Once, 23 of 25 cycles Administration: 400 mg (08/07/2019), 400 mg (08/21/2019), 400 mg (09/04/2019), 400 mg (09/20/2019), 400 mg (10/04/2019), 400 mg (10/16/2019), 400 mg (10/30/2019), 400 mg (11/22/2019), 400 mg (12/06/2019), 400 mg (12/20/2019), 400 mg (01/03/2020), 400 mg (01/29/2020), 400 mg (02/12/2020), 400 mg (02/26/2020), 400 mg (03/11/2020), 400 mg (03/25/2020), 400 mg (04/08/2020), 400 mg (04/24/2020), 400 mg (05/08/2020), 400 mg (05/22/2020), 400 mg (06/10/2020), 400 mg (06/25/2020)   for chemotherapy treatment.     07/08/2020 -  Chemotherapy    Patient is on Treatment Plan: COLORECTAL FOLFIRI / BEVACIZUMAB Q14D       11/05/2020 Cancer Staging   Staging form: Colon and Rectum, AJCC 8th Edition - Clinical: Stage IVC (pM1c) - Signed by Cammie Sickle, MD on 11/05/2020       HISTORY OF PRESENTING ILLNESS:  Caro Hight 80 y.o.  male with metastatic colon cancer to the liver most recently is status post IR ablation of the liver lesion.  However on follow-up MRI noted to have progression of disease in the liver.  Patient is currently awaiting Y 90 treatment with IR.  Patient denies any abdominal pain nausea vomiting.  Denies any fevers or chills.  Denies any skin rash.  Continues to have chronic joint pain shoulder pain.  Not any worse.  Weight is stable.   Review of Systems  Constitutional:  Positive for malaise/fatigue. Negative for chills, diaphoresis and fever.  HENT:  Negative for nosebleeds and sore throat.   Eyes:  Negative for double vision.  Respiratory:  Negative for cough, hemoptysis, sputum production, shortness of breath and wheezing.   Cardiovascular:  Negative for chest pain, palpitations, orthopnea and leg swelling.  Gastrointestinal:  Negative for blood in stool, constipation, heartburn, melena, nausea and vomiting.  Genitourinary:  Negative for dysuria, frequency and urgency.  Musculoskeletal:  Positive for back pain and joint pain.  Skin: Negative.  Negative for itching and rash.  Neurological:  Positive for tingling. Negative for focal weakness, weakness and headaches.  Endo/Heme/Allergies:  Does not bruise/bleed easily.  Psychiatric/Behavioral:  Negative for depression. The patient is not nervous/anxious and does not have insomnia.     MEDICAL HISTORY:  Past Medical History:  Diagnosis Date   Arthritis    Cancer ([place])    colon cancer 02/2019 per pt    Family history of adverse reaction to anesthesia    cousin took all night to wake up from anesthesia   H/O colon cancer, stage IV  Hyperlipemia    Neuromuscular disorder ([place])    Pre-diabetes     SURGICAL HISTORY: Past Surgical History:  Procedure Laterality Date   COLON SURGERY     ESOPHAGOGASTRODUODENOSCOPY (EGD) WITH PROPOFOL N/A 12/26/2020   Procedure:  ESOPHAGOGASTRODUODENOSCOPY (EGD) WITH PROPOFOL;  Surgeon: [name], MD;  Location: [facility] ENDOSCOPY;  Service: Gastroenterology;  Laterality: N/A;   IR ANGIOGRAM SELECTIVE EACH ADDITIONAL VESSEL  12/19/2020   IR ANGIOGRAM SELECTIVE EACH ADDITIONAL VESSEL  03/24/2021   IR ANGIOGRAM SELECTIVE EACH ADDITIONAL VESSEL  03/24/2021   IR ANGIOGRAM SELECTIVE EACH ADDITIONAL VESSEL  04/04/2021   IR ANGIOGRAM SELECTIVE EACH ADDITIONAL VESSEL  04/04/2021   IR ANGIOGRAM SELECTIVE EACH ADDITIONAL VESSEL  04/04/2021   IR ANGIOGRAM VISCERAL SELECTIVE  12/19/2020   IR ANGIOGRAM VISCERAL SELECTIVE  03/24/2021   IR ANGIOGRAM VISCERAL SELECTIVE  04/04/2021   IR ANGIOGRAM VISCERAL SELECTIVE  04/04/2021   IR EMBO ARTERIAL NOT HEMORR HEMANG INC GUIDE ROADMAPPING  03/24/2021   IR EMBO TUMOR ORGAN ISCHEMIA INFARCT INC GUIDE ROADMAPPING  12/19/2020   IR EMBO TUMOR ORGAN ISCHEMIA INFARCT INC GUIDE ROADMAPPING  04/04/2021   IR IMAGING GUIDED PORT INSERTION  07/20/2019   IR RADIOLOGIST EVAL & MGMT  12/05/2020   IR RADIOLOGIST EVAL & MGMT  02/11/2021   IR RADIOLOGIST EVAL & MGMT  03/04/2021   IR US GUIDE VASC ACCESS RIGHT  12/19/2020   IR US GUIDE VASC ACCESS RIGHT  03/24/2021   IR US GUIDE VASC ACCESS RIGHT  04/04/2021   JOINT REPLACEMENT Left 2010   Knee   RADIOLOGY WITH ANESTHESIA N/A 01/15/2021   Procedure: CT WITH ANESTHESIA MICROWAVE ABLATION OF LIVER;  Surgeon: Criselda Peaches, MD;  Location: [facility] ORS;  Service: Anesthesiology;  Laterality: N/A;    SOCIAL HISTORY: Social History   Socioeconomic History   Marital status: Married    Spouse name: Not on file   Number of children: Not on file   Years of education: Not on file   Highest education level: Not on file  Occupational History   Not on file  Tobacco Use   Smoking status: Every Day    Packs/day: 0.25    Years: 15.00    Pack years: 3.75    Types: Cigarettes   Smokeless tobacco: Never   Tobacco comments:    1 to 2 cigarettes a day occasionally  Vaping Use   Vaping  Use: Never used  Substance and Sexual Activity   Alcohol use: Yes    Comment: rarely    Drug use: No   Sexual activity: Not on file  Other Topics Concern   Not on file  Social History Narrative   Recruitment consultant retd; lives in [place]; smoking 3cig/day; [3/4 ppd x started at 9 years]; no alcohol. Son & daughter; wife dementia [waiting for placement].    Social Determinants of Health   Financial Resource Strain: Not on file  Food Insecurity: Not on file  Transportation Needs: Not on file  Physical Activity: Not on file  Stress: Not on file  Social Connections: Not on file  Intimate Partner Violence: Not on file    FAMILY HISTORY: Family History  Problem Relation Age of Onset   Peptic Ulcer Disease Father     ALLERGIES:  is allergic to ace inhibitors.  MEDICATIONS:  Current Outpatient Medications  Medication Sig Dispense Refill   pravastatin (PRAVACHOL) 40 MG tablet Take 40 mg by mouth daily.      sucralfate (CARAFATE) 1 g tablet TAKE 1  TABLET (1 G TOTAL) BY MOUTH 4 (FOUR) TIMES DAILY - WITH MEALS AND AT BEDTIME. 90 tablet 3   tamsulosin (FLOMAX) 0.4 MG CAPS capsule Take 1 capsule (0.4 mg total) by mouth daily. 30 capsule 11   ondansetron (ZOFRAN) 4 MG tablet Take 1 tablet (4 mg total) by mouth every 8 (eight) hours as needed for nausea or vomiting. (Patient not taking: No sig reported) 20 tablet 0   ondansetron (ZOFRAN) 8 MG tablet Take 1 tablet (8 mg total) by mouth every 8 (eight) hours as needed for nausea or vomiting. (Patient not taking: No sig reported) 20 tablet 0   oxyCODONE (OXY IR/ROXICODONE) 5 MG immediate release tablet Take 1 tablet (5 mg total) by mouth every 6 (six) hours as needed for severe pain. (Patient not taking: Reported on 04/01/2021) 60 tablet 0   pantoprazole (PROTONIX) 40 MG tablet Take 1 tablet (40 mg total) by mouth 2 (two) times daily. (Patient not taking: No sig reported) 60 tablet 3   prochlorperazine (COMPAZINE) 10 MG tablet Take 1  tablet (10 mg total) by mouth every 6 (six) hours as needed for nausea or vomiting. (Patient not taking: No sig reported) 60 tablet 0   No current facility-administered medications for this visit.      Marland Kitchen  PHYSICAL EXAMINATION: ECOG PERFORMANCE STATUS: 0 - Asymptomatic  Vitals:   04/01/21 1010  BP: 139/71  Pulse: 61  Resp: 20  Temp: (!) 96.6 F (35.9 C)   Filed Weights   04/01/21 1010  Weight: 186 lb (84.4 kg)    Physical Exam HENT:     Head: Normocephalic and atraumatic.     Mouth/Throat:     Pharynx: No oropharyngeal exudate.     Comments: Whitish patches noted right side.  Eyes:     Pupils: Pupils are equal, round, and reactive to light.  Cardiovascular:     Rate and Rhythm: Normal rate and regular rhythm.  Pulmonary:     Effort: No respiratory distress.     Breath sounds: No wheezing.     Comments: Decreased breath sounds bilaterally.  No wheeze or crackles Abdominal:     General: Bowel sounds are normal. There is no distension.     Palpations: Abdomen is soft. There is no mass.     Tenderness: There is no abdominal tenderness. There is no guarding or rebound.  Musculoskeletal:        General: No tenderness. Normal range of motion.     Cervical back: Normal range of motion and neck supple.  Skin:    General: Skin is warm.  Neurological:     Mental Status: He is alert and oriented to person, place, and time.  Psychiatric:        Mood and Affect: Affect normal.   LABORATORY DATA:  I have reviewed the data as listed Lab Results  Component Value Date   WBC 5.6 04/04/2021   HGB 11.7 (L) 04/04/2021   HCT 35.6 (L) 04/04/2021   MCV 97.3 04/04/2021   PLT 161 04/04/2021   Recent Labs    06/25/20 0841 07/08/20 0843 07/22/20 0836 08/05/20 0821 03/24/21 0825 04/01/21 1002 04/04/21 0811  NA 138 139 138   < > 141 135 142  K 3.6 3.6 3.6   < > 4.0 3.6 3.6  CL 108 108 107   < > 107 102 110  CO2 $Re'24 25 25   'ara$ < > $R'27 25 26  'DF$ GLUCOSE 201* 209* 195*   < > 126*  216*  121*  BUN $Re'10 9 10   'unI$ < > $R'13 13 15  'PT$ CREATININE 0.80 0.70 0.89   < > 0.89 0.88 0.86  CALCIUM 8.0* 8.0* 8.1*   < > 9.1 8.5* 8.9  GFRNONAA >60 >60 >60   < > >60 >60 >60  GFRAA >60 >60 >60  --   --   --   --   PROT 6.5 6.5 6.7   < > 7.5 6.9 7.0  ALBUMIN 3.2* 3.1* 3.1*   < > 3.9 3.3* 3.6  AST 32 31 29   < > $R'28 27 28  'xI$ ALT $'20 19 19   'e$ < > $R'17 16 16  'kc$ ALKPHOS 121 102 108   < > 110 111 116  BILITOT 0.8 0.7 0.8   < > 0.9 0.7 0.8   < > = values in this interval not displayed.    RADIOGRAPHIC STUDIES: I have personally reviewed the radiological images as listed and agreed with the findings in the report. NM LIVER TUMOR LOC IMFLAM SPECT 1 DAY  Result Date: 04/04/2021 CLINICAL DATA:  History of colon cancer with unresectable liver metastasis. EXAM: NUCLEAR MEDICINE SPECIAL MED RAD PHYSICS CONS; NUCLEAR MEDICINE RADIO PHARM THERAPY INTRA ARTERIAL; NUCLEAR MEDICINE TREATMENT PROCEDURE; NUCLEAR MEDICINE LIVER SCAN TECHNIQUE: In conjunction with the interventional radiologist a Y- Microsphere dose was calculated utilizing body surface area formulation. Calculated dose equal 35.1 mCi. Pre therapy MAA liver SPECT scan and CTA were evaluated. Utilizing a microcatheter system, the hepatic artery was selected and Y-90 microspheres were delivered in fractionated aliquots. Radiopharmaceutical was delivered by the interventional radiologist and nuclear radiologist. The patient tolerated procedure well. No adverse effects were noted. Bremsstrahlung planar and SPECT imaging of the abdomen following intrahepatic arterial delivery of Y-90 microsphere was performed. RADIOPHARMACEUTICALS:  36.2 MCI Y-90 MICROSPHERES INSTILLED VIA INTRAHEPATIC ARTERY CATHETER COMPARISON:  02/27/2021 FINDINGS: Y - 90 microspheres therapy as above. First therapy the right hepatic lobe. Bremsstrahlung planar and SPECT imaging of the abdomen following intrahepatic arterial delivery of Y-59microsphere demonstrates radioactivity localized to the right  hepatic lobe. No evidence of extrahepatic activity. IMPRESSION: Successful Y - 90 microsphere delivery for treatment of unresectable liver metastasis. First therapy to the right lobe. Bremssstrahlung scan demonstrates activity localized to right hepatic lobe with no extrahepatic activity identified. Electronically Signed   By: Kerby Moors M.D.   On: 04/04/2021 14:55   NM LIVER TUMOR LOC IMFLAM SPECT 1 DAY  Result Date: 03/24/2021 CLINICAL DATA:  History of colon cancer with unresectable liver metastases. EXAM: NUCLEAR MEDICINE LIVER SCAN; ULTRASOUND MISCELLANEOUS SOFT TISSUE TECHNIQUE: Abdominal images were obtained in multiple projections after intrahepatic arterial injection of radiopharmaceutical. SPECT imaging was performed. Lung shunt calculation was performed. RADIOPHARMACEUTICALS:  4.95millicurie MAA TECHNETIUM TO 107M ALBUMIN AGGREGATED COMPARISON:  MRI 02/27/21 FINDINGS: The injected microaggregated albumin localizes within the liver. No evidence of activity within the stomach, duodenum, or bowel. Calculated shunt fraction to the lungs equals 5.8%. IMPRESSION: 1. No significant extrahepatic radiotracer activity following intrahepatic arterial injection of MAA. 2. Lung shunt fraction equals 5.8% Electronically Signed   By: Kerby Moors M.D.   On: 03/24/2021 14:26   IR Angiogram Visceral Selective  Result Date: 04/04/2021 INDICATION: 80 year old male with right-sided colon adenocarcinoma metastatic to the liver. He has multifocal disease in the right hepatic lobe and presents today for right lobar trans arterial radio embolization with Y90. EXAM: IR EMBO TUMOR ORGAN ISCHEMIA INFARCT INC GUIDE ROADMAPPING; SELECTIVE VISCERAL ARTERIOGRAPHY; ADDITIONAL ARTERIOGRAPHY; IR ULTRASOUND GUIDANCE VASC  ACCESS RIGHT MEDICATIONS: 3.375 g Zosyn; 12.5 mg Benadryl. The antibiotic was administered within 1 hour of the procedure ANESTHESIA/SEDATION: Moderate (conscious) sedation was employed during this procedure. A  total of Versed 4 mg and Fentanyl 100 mcg was administered intravenously. Moderate Sedation Time: 52 minutes. The patient's level of consciousness and vital signs were monitored continuously by radiology nursing throughout the procedure under my direct supervision. CONTRAST:  50 mL Omnipaque 300 FLUOROSCOPY TIME:  Fluoroscopy Time: 5 minutes 12 seconds (511 mGy). COMPLICATIONS: None immediate. PROCEDURE: Informed consent was obtained from the patient following explanation of the procedure, risks, benefits and alternatives. The patient understands, agrees and consents for the procedure. All questions were addressed. A time out was performed prior to the initiation of the procedure. Maximal barrier sterile technique utilized including caps, mask, sterile gowns, sterile gloves, large sterile drape, hand hygiene, and Betadine prep. The right common femoral artery was interrogated with ultrasound and found to be widely patent. An image was obtained and stored for the medical record. Local anesthesia was attained by infiltration with 1% lidocaine. A small dermatotomy was made. Under real-time sonographic guidance, the vessel was punctured with a 21 gauge micropuncture needle. Using standard technique, the initial micro needle was exchanged over a 0.018 micro wire for a transitional 4 [place] micro sheath. The micro sheath was then exchanged over a 0.035 wire for a 5 French vascular sheath. A C2 cobra catheter was advanced over a Bentson wire into the abdominal aorta and used to select the superior mesenteric artery. A superior mesenteric arteriogram was performed. No evidence of accessory or replaced right hepatic artery. The portal vein remains patent. The C2 cobra catheter was next advanced into the celiac artery. A celiac arteriogram was performed to provide a map for catheterization of the common hepatic artery. The C2 catheter was then advanced over a Glidewire into the common hepatic artery. Arteriography was then  performed in multiple obliquities. The origins of the left hepatic artery, segment 5/7 hepatic artery and main right hepatic artery were identified. A 2.5 French Cook Cantata microcatheter was advanced over a Fathom 16 wire in used to select the left hepatic artery. Arteriography was performed. No definite evidence of tumoral blush. The microcatheter was next advanced beyond the segment 5/7 branch and into the dominant right hepatic artery. Contrast injection was performed confirming on precise location of the microcatheter tip as well as multifocal tumoral blush consistent with known multifocal hepatic metastatic disease. The microcatheter was secured in place. Trans arterial radio embolization was then performed using our standard protocol. Resin Y 90 labeled microspheres were injected in aliquots. Antegrade flow was assessed multiple times throughout the duration of the injection confirming persistent antegrade flow and absence of reflux. Once the entire Y 90 dose was administered, the microcatheter was brought back into the 5 [place] base catheter and the catheter system removed as a unit and appropriately disposed of. Hemostasis was then attained with the assistance of a Celt arterial closure device. IMPRESSION: Successful right lobar trans arterial radio embolization with Y90 sparing hepatic segments 5 and 7. Signed, Criselda Peaches, MD, [facility] Electronically Signed   By: Jacqulynn Cadet M.D.   On: 04/04/2021 11:58   IR Angiogram Visceral Selective  Result Date: 04/04/2021 INDICATION: 80 year old male with right-sided colon adenocarcinoma metastatic to the liver. He has multifocal disease in the right hepatic lobe and presents today for right lobar trans arterial radio embolization with Y90. EXAM: IR EMBO TUMOR  ORGAN ISCHEMIA INFARCT INC GUIDE ROADMAPPING; SELECTIVE VISCERAL ARTERIOGRAPHY; ADDITIONAL ARTERIOGRAPHY; IR ULTRASOUND  GUIDANCE VASC ACCESS RIGHT MEDICATIONS: 3.375 g Zosyn; 12.5 mg Benadryl. The antibiotic was administered within 1 hour of the procedure ANESTHESIA/SEDATION: Moderate (conscious) sedation was employed during this procedure. A total of Versed 4 mg and Fentanyl 100 mcg was administered intravenously. Moderate Sedation Time: 52 minutes. The patient's level of consciousness and vital signs were monitored continuously by radiology nursing throughout the procedure under my direct supervision. CONTRAST:  50 mL Omnipaque 300 FLUOROSCOPY TIME:  Fluoroscopy Time: 5 minutes 12 seconds (511 mGy). COMPLICATIONS: None immediate. PROCEDURE: Informed consent was obtained from the patient following explanation of the procedure, risks, benefits and alternatives. The patient understands, agrees and consents for the procedure. All questions were addressed. A time out was performed prior to the initiation of the procedure. Maximal barrier sterile technique utilized including caps, mask, sterile gowns, sterile gloves, large sterile drape, hand hygiene, and Betadine prep. The right common femoral artery was interrogated with ultrasound and found to be widely patent. An image was obtained and stored for the medical record. Local anesthesia was attained by infiltration with 1% lidocaine. A small dermatotomy was made. Under real-time sonographic guidance, the vessel was punctured with a 21 gauge micropuncture needle. Using standard technique, the initial micro needle was exchanged over a 0.018 micro wire for a transitional 4 [place] micro sheath. The micro sheath was then exchanged over a 0.035 wire for a 5 French vascular sheath. A C2 cobra catheter was advanced over a Bentson wire into the abdominal aorta and used to select the superior mesenteric artery. A superior mesenteric arteriogram was performed. No evidence of accessory or replaced right hepatic artery. The portal vein remains patent. The C2 cobra catheter was next advanced into the  celiac artery. A celiac arteriogram was performed to provide a map for catheterization of the common hepatic artery. The C2 catheter was then advanced over a Glidewire into the common hepatic artery. Arteriography was then performed in multiple obliquities. The origins of the left hepatic artery, segment 5/7 hepatic artery and main right hepatic artery were identified. A 2.5 French Cook Cantata microcatheter was advanced over a Fathom 16 wire in used to select the left hepatic artery. Arteriography was performed. No definite evidence of tumoral blush. The microcatheter was next advanced beyond the segment 5/7 branch and into the dominant right hepatic artery. Contrast injection was performed confirming on precise location of the microcatheter tip as well as multifocal tumoral blush consistent with known multifocal hepatic metastatic disease. The microcatheter was secured in place. Trans arterial radio embolization was then performed using our standard protocol. Resin Y 90 labeled microspheres were injected in aliquots. Antegrade flow was assessed multiple times throughout the duration of the injection confirming persistent antegrade flow and absence of reflux. Once the entire Y 90 dose was administered, the microcatheter was brought back into the 5 [place] base catheter and the catheter system removed as a unit and appropriately disposed of. Hemostasis was then attained with the assistance of a Celt arterial closure device. IMPRESSION: Successful right lobar trans arterial radio embolization with Y90 sparing hepatic segments 5 and 7. Signed, Criselda Peaches, MD, [facility] Radiology Electronically Signed   By: Jacqulynn Cadet M.D.   On: 04/04/2021 11:58   IR Angiogram Visceral Selective  Result Date: 03/24/2021 INDICATION: 80 year old male with right-sided colon cancer metastatic to the liver. He has multifocal right hepatic metastatic lesions which have  been progressive on recent imaging. He presents today for hepatic arteriography with possible embolization and technetium MAA injection prior to definitive Y 90 radio therapy. EXAM: IR EMBO ARTERIAL NOT HEMORR HEMANG INC GUIDE ROADMAPPING; IR ULTRASOUND GUIDANCE VASC ACCESS RIGHT; SELECTIVE VISCERAL ARTERIOGRAPHY; ADDITIONAL ARTERIOGRAPHY MEDICATIONS: None. ANESTHESIA/SEDATION: Moderate (conscious) sedation was employed during this procedure. A total of Versed 2.5 mg and Fentanyl 100 mcg was administered intravenously. Moderate Sedation Time: 47 minutes. The patient's level of consciousness and vital signs were monitored continuously by radiology nursing throughout the procedure under my direct supervision. CONTRAST:  40mL OMNIPAQUE IOHEXOL 300 MG/ML  SOLN FLUOROSCOPY TIME:  Fluoroscopy Time: 9 minutes 12 seconds (556 mGy). COMPLICATIONS: None immediate. PROCEDURE: Informed consent was obtained from the patient following explanation of the procedure, risks, benefits and alternatives. The patient understands, agrees and consents for the procedure. All questions were addressed. A time out was performed prior to the initiation of the procedure. Maximal barrier sterile technique utilized including caps, mask, sterile gowns, sterile gloves, large sterile drape, hand hygiene, and Betadine prep. The right common femoral artery was interrogated with ultrasound and found to be widely patent. An image was obtained and stored for the medical record. Local anesthesia was attained by infiltration with 1% lidocaine. A small dermatotomy was made. Under real-time sonographic guidance, the vessel was punctured with a 21 gauge micropuncture needle. Using standard technique, the initial micro needle was exchanged over a 0.018 micro wire for a transitional 4 [place] micro sheath. The micro sheath was then exchanged over a 0.035 wire for a 5 French vascular sheath. A C2 cobra catheter was advanced over a Bentson wire into the abdominal  aorta and used to select the celiac artery. A celiac arteriogram was performed. Conventional hepatic arterial anatomy. The C2 cobra catheter was advanced over a Glidewire into the common hepatic artery. Additional arteriography was performed in multiple obliquities. Contrast injection confirms 3 large arterially enhancing lesions in the right hemi liver consistent with the recent prior MRI. No evidence of lesion in the left hepatic lobe. A renegade STC microcatheter was advanced coaxially through the 5 French sheath using a Fathom 16 wire. The catheter was advanced into the first branch artery arising from the right hepatic artery, this appears to be the segment 5/7 branch. Contrast injection was performed. However, there was reflux into the main right hepatic artery. Therefore, the microcatheter was advanced more distally in the segment 5/7 branch. Repeat contrast injection was performed. No definitive supply of the arterially enhancing lesions arising from this branch artery. Microcatheter was brought back and advanced into the right main hepatic artery. Arteriography was performed. Excellent visualization of the tumor blush of all 3 lesions. No visceral branch arteries. No evidence of other arterial variance. Technetium macro aggregated albumin was then injected. The catheter system was removed and appropriately disposed of. A limited right common femoral arteriogram was performed confirming common femoral arterial access away from the previously placed Celt ACD closure device. Hemostasis was attained again with the aid of a second Celt ACD. IMPRESSION: 1. Successful planning hepatic arteriogram prior to Y 90 trans arterial radioembolization. Patient is a candidate for right semi lobar embolization sparing segments 5 and 7. 2. Technetium macro gated albumin injection into the segment 8/6 hepatic artery. Electronically Signed   By: Jacqulynn Cadet M.D.   On: 03/24/2021 11:19   IR Angiogram Selective Each  Additional Vessel  Result Date: 04/04/2021 INDICATION: 80 year old male with right-sided colon adenocarcinoma metastatic to the liver. He has multifocal  disease in the right hepatic lobe and presents today for right lobar trans arterial radio embolization with Y90. EXAM: IR EMBO TUMOR ORGAN ISCHEMIA INFARCT INC GUIDE ROADMAPPING; SELECTIVE VISCERAL ARTERIOGRAPHY; ADDITIONAL ARTERIOGRAPHY; IR ULTRASOUND GUIDANCE VASC ACCESS RIGHT MEDICATIONS: 3.375 g Zosyn; 12.5 mg Benadryl. The antibiotic was administered within 1 hour of the procedure ANESTHESIA/SEDATION: Moderate (conscious) sedation was employed during this procedure. A total of Versed 4 mg and Fentanyl 100 mcg was administered intravenously. Moderate Sedation Time: 52 minutes. The patient's level of consciousness and vital signs were monitored continuously by radiology nursing throughout the procedure under my direct supervision. CONTRAST:  50 mL Omnipaque 300 FLUOROSCOPY TIME:  Fluoroscopy Time: 5 minutes 12 seconds (511 mGy). COMPLICATIONS: None immediate. PROCEDURE: Informed consent was obtained from the patient following explanation of the procedure, risks, benefits and alternatives. The patient understands, agrees and consents for the procedure. All questions were addressed. A time out was performed prior to the initiation of the procedure. Maximal barrier sterile technique utilized including caps, mask, sterile gowns, sterile gloves, large sterile drape, hand hygiene, and Betadine prep. The right common femoral artery was interrogated with ultrasound and found to be widely patent. An image was obtained and stored for the medical record. Local anesthesia was attained by infiltration with 1% lidocaine. A small dermatotomy was made. Under real-time sonographic guidance, the vessel was punctured with a 21 gauge micropuncture needle. Using standard technique, the initial micro needle was exchanged over a 0.018 micro wire for a transitional 4 [place] micro  sheath. The micro sheath was then exchanged over a 0.035 wire for a 5 French vascular sheath. A C2 cobra catheter was advanced over a Bentson wire into the abdominal aorta and used to select the superior mesenteric artery. A superior mesenteric arteriogram was performed. No evidence of accessory or replaced right hepatic artery. The portal vein remains patent. The C2 cobra catheter was next advanced into the celiac artery. A celiac arteriogram was performed to provide a map for catheterization of the common hepatic artery. The C2 catheter was then advanced over a Glidewire into the common hepatic artery. Arteriography was then performed in multiple obliquities. The origins of the left hepatic artery, segment 5/7 hepatic artery and main right hepatic artery were identified. A 2.5 French Cook Cantata microcatheter was advanced over a Fathom 16 wire in used to select the left hepatic artery. Arteriography was performed. No definite evidence of tumoral blush. The microcatheter was next advanced beyond the segment 5/7 branch and into the dominant right hepatic artery. Contrast injection was performed confirming on precise location of the microcatheter tip as well as multifocal tumoral blush consistent with known multifocal hepatic metastatic disease. The microcatheter was secured in place. Trans arterial radio embolization was then performed using our standard protocol. Resin Y 90 labeled microspheres were injected in aliquots. Antegrade flow was assessed multiple times throughout the duration of the injection confirming persistent antegrade flow and absence of reflux. Once the entire Y 90 dose was administered, the microcatheter was brought back into the 5 [place] base catheter and the catheter system removed as a unit and appropriately disposed of. Hemostasis was then attained with the assistance of a Celt arterial closure device. IMPRESSION: Successful right lobar trans arterial radio embolization with Y90 sparing  hepatic segments 5 and 7. Signed, Criselda Peaches, MD, [place] [facility] [facility] Radiology Electronically Signed   By: Jacqulynn Cadet M.D.   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 He has multifocal disease in the right hepatic lobe and presents today for right lobar trans arterial radio embolization with Y90. EXAM: IR EMBO TUMOR ORGAN ISCHEMIA INFARCT INC GUIDE ROADMAPPING; SELECTIVE VISCERAL ARTERIOGRAPHY; ADDITIONAL ARTERIOGRAPHY; IR ULTRASOUND GUIDANCE VASC ACCESS RIGHT MEDICATIONS: 3.375 g Zosyn; 12.5 mg Benadryl. The antibiotic was administered within 1 hour of the procedure ANESTHESIA/SEDATION: Moderate (conscious) sedation was employed during this procedure. A total of Versed 4 mg and Fentanyl 100 mcg was administered intravenously. Moderate Sedation Time: 52 minutes. The patient's level of consciousness and vital signs were monitored continuously by radiology nursing throughout the procedure under my direct supervision. CONTRAST:  50 mL Omnipaque 300 FLUOROSCOPY TIME:  Fluoroscopy Time: 5 minutes 12 seconds (511 mGy). COMPLICATIONS: None immediate. PROCEDURE: Informed consent was obtained from the patient following explanation of the procedure, risks, benefits and alternatives. The patient understands, agrees and consents for the procedure. All questions were addressed. A time out was performed prior to the initiation of the procedure. Maximal barrier sterile technique utilized including caps, mask, sterile gowns, sterile gloves, large sterile drape, hand hygiene, and Betadine prep. The right common femoral artery was interrogated with ultrasound and found to be widely patent. An image was obtained and stored for the medical record. Local anesthesia was attained by infiltration with 1% lidocaine. A small  dermatotomy was made. Under real-time sonographic guidance, the vessel was punctured with a 21 gauge micropuncture needle. Using standard technique, the initial micro needle was exchanged over a 0.018 micro wire for a transitional 4 [place] micro sheath. The micro sheath was then exchanged over a 0.035 wire for a 5 French vascular sheath. A C2 cobra catheter was advanced over a Bentson wire into the abdominal aorta and used to select the superior mesenteric artery. A superior mesenteric arteriogram was performed. No evidence of accessory or replaced right hepatic artery. The portal vein remains patent. The C2 cobra catheter was next advanced into the celiac artery. A celiac arteriogram was performed to provide a map for catheterization of the common hepatic artery. The C2 catheter was then advanced over a Glidewire into the common hepatic artery. Arteriography was then performed in multiple obliquities. The origins of the left hepatic artery, segment 5/7 hepatic artery and main right hepatic artery were identified. A 2.5 French Cook Cantata microcatheter was advanced over a Fathom 16 wire in used to select the left hepatic artery. Arteriography was performed. No definite evidence of tumoral blush. The microcatheter was next advanced beyond the segment 5/7 branch and into the dominant right hepatic artery. Contrast injection was performed confirming on precise location of the microcatheter tip as well as multifocal tumoral blush consistent with known multifocal hepatic metastatic disease. The microcatheter was secured in place. Trans arterial radio embolization was then performed using our standard protocol. Resin Y 90 labeled microspheres were injected in aliquots. Antegrade flow was assessed multiple times throughout the duration of the injection confirming persistent antegrade flow and absence of reflux. Once the entire Y 90 dose was administered, the microcatheter was brought back into the 5 [place] base catheter  and the catheter system removed as a unit and appropriately disposed of. Hemostasis was then attained with the assistance of a Celt arterial closure device. IMPRESSION: Successful right lobar trans arterial radio embolization with Y90 sparing hepatic segments 5 and 7. Signed, Criselda Peaches, MD, [place] [facility] Radiology Electronically Signed   By: Myrle Sheng  Laurence Ferrari M.D.   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 He has multifocal disease in the right hepatic lobe and presents today for right lobar trans arterial radio embolization with Y90. EXAM: IR EMBO TUMOR ORGAN ISCHEMIA INFARCT INC GUIDE ROADMAPPING; SELECTIVE VISCERAL ARTERIOGRAPHY; ADDITIONAL ARTERIOGRAPHY; IR ULTRASOUND GUIDANCE VASC ACCESS RIGHT MEDICATIONS: 3.375 g Zosyn; 12.5 mg Benadryl. The antibiotic was administered within 1 hour of the procedure ANESTHESIA/SEDATION: Moderate (conscious) sedation was employed during this procedure. A total of Versed 4 mg and Fentanyl 100 mcg was administered intravenously. Moderate Sedation Time: 52 minutes. The patient's level of consciousness and vital signs were monitored continuously by radiology nursing throughout the procedure under my direct supervision. CONTRAST:  50 mL Omnipaque 300 FLUOROSCOPY TIME:  Fluoroscopy Time: 5 minutes 12 seconds (511 mGy). COMPLICATIONS: None immediate. PROCEDURE: Informed consent was obtained from the patient following explanation of the procedure, risks, benefits and alternatives. The patient understands, agrees and consents for the procedure. All questions were addressed. A time out was performed prior to the initiation of the procedure. Maximal barrier sterile technique utilized including caps, mask, sterile gowns, sterile gloves, large sterile drape, hand hygiene, and  Betadine prep. The right common femoral artery was interrogated with ultrasound and found to be widely patent. An image was obtained and stored for the medical record. Local anesthesia was attained by infiltration with 1% lidocaine. A small dermatotomy was made. Under real-time sonographic guidance, the vessel was punctured with a 21 gauge micropuncture needle. Using standard technique, the initial micro needle was exchanged over a 0.018 micro wire for a transitional 4 [place] micro sheath. The micro sheath was then exchanged over a 0.035 wire for a 5 French vascular sheath. A C2 cobra catheter was advanced over a Bentson wire into the abdominal aorta and used to select the superior mesenteric artery. A superior mesenteric arteriogram was performed. No evidence of accessory or replaced right hepatic artery. The portal vein remains patent. The C2 cobra catheter was next advanced into the celiac artery. A celiac arteriogram was performed to provide a map for catheterization of the common hepatic artery. The C2 catheter was then advanced over a Glidewire into the common hepatic artery. Arteriography was then performed in multiple obliquities. The origins of the left hepatic artery, segment 5/7 hepatic artery and main right hepatic artery were identified. A 2.5 French Cook Cantata microcatheter was advanced over a Fathom 16 wire in used to select the left hepatic artery. Arteriography was performed. No definite evidence of tumoral blush. The microcatheter was next advanced beyond the segment 5/7 branch and into the dominant right hepatic artery. Contrast injection was performed confirming on precise location of the microcatheter tip as well as multifocal tumoral blush consistent with known multifocal hepatic metastatic disease. The microcatheter was secured in place. Trans arterial radio embolization was then performed using our standard protocol. Resin Y 90 labeled microspheres were injected in aliquots. Antegrade flow  was assessed multiple times throughout the duration of the injection confirming persistent antegrade flow and absence of reflux. Once the entire Y 90 dose was administered, the microcatheter was brought back into the 5 [place] base catheter and the catheter system removed as a unit and appropriately disposed of. Hemostasis was then attained with the assistance of a Celt arterial closure device. IMPRESSION: Successful right lobar trans arterial radio embolization with Y90 sparing hepatic segments 5 and 7. Signed, Antonietta Jewel.  Laurence Ferrari, MD, [place] [facility] Radiology Electronically Signed   By: Jacqulynn Cadet M.D.   On: 04/04/2021 11:58   IR Angiogram Selective Each Additional Vessel  Result Date: 03/24/2021 INDICATION: 80 year old male with right-sided colon cancer metastatic to the liver. He has multifocal right hepatic metastatic lesions which have been progressive on recent imaging. He presents today for hepatic arteriography with possible embolization and technetium MAA injection prior to definitive Y 90 radio therapy. EXAM: IR EMBO ARTERIAL NOT HEMORR HEMANG INC GUIDE ROADMAPPING; IR ULTRASOUND GUIDANCE VASC ACCESS RIGHT; SELECTIVE VISCERAL ARTERIOGRAPHY; ADDITIONAL ARTERIOGRAPHY MEDICATIONS: None. ANESTHESIA/SEDATION: Moderate (conscious) sedation was employed during this procedure. A total of Versed 2.5 mg and Fentanyl 100 mcg was administered intravenously. Moderate Sedation Time: 47 minutes. The patient's level of consciousness and vital signs were monitored continuously by radiology nursing throughout the procedure under my direct supervision. CONTRAST:  72mL OMNIPAQUE IOHEXOL 300 MG/ML  SOLN FLUOROSCOPY TIME:  Fluoroscopy Time: 9 minutes 12 seconds (556 mGy). COMPLICATIONS: None immediate. PROCEDURE: Informed consent was obtained from the patient following explanation of the procedure, risks, benefits and alternatives. The patient understands, agrees and  consents for the procedure. All questions were addressed. A time out was performed prior to the initiation of the procedure. Maximal barrier sterile technique utilized including caps, mask, sterile gowns, sterile gloves, large sterile drape, hand hygiene, and Betadine prep. The right common femoral artery was interrogated with ultrasound and found to be widely patent. An image was obtained and stored for the medical record. Local anesthesia was attained by infiltration with 1% lidocaine. A small dermatotomy was made. Under real-time sonographic guidance, the vessel was punctured with a 21 gauge micropuncture needle. Using standard technique, the initial micro needle was exchanged over a 0.018 micro wire for a transitional 4 [place] micro sheath. The micro sheath was then exchanged over a 0.035 wire for a 5 French vascular sheath. A C2 cobra catheter was advanced over a Bentson wire into the abdominal aorta and used to select the celiac artery. A celiac arteriogram was performed. Conventional hepatic arterial anatomy. The C2 cobra catheter was advanced over a Glidewire into the common hepatic artery. Additional arteriography was performed in multiple obliquities. Contrast injection confirms 3 large arterially enhancing lesions in the right hemi liver consistent with the recent prior MRI. No evidence of lesion in the left hepatic lobe. A renegade STC microcatheter was advanced coaxially through the 5 [place] sheath using a Fathom 16 wire. The catheter was advanced into the first branch artery arising from the right hepatic artery, this appears to be the segment 5/7 branch. Contrast injection was performed. However, there was reflux into the main right hepatic artery. Therefore, the microcatheter was advanced more distally in the segment 5/7 branch. Repeat contrast injection was performed. No definitive supply of the arterially enhancing lesions arising from this branch artery. Microcatheter was brought back and advanced  into the right main hepatic artery. Arteriography was performed. Excellent visualization of the tumor blush of all 3 lesions. No visceral branch arteries. No evidence of other arterial variance. Technetium macro aggregated albumin was then injected. The catheter system was removed and appropriately disposed of. A limited right common femoral arteriogram was performed confirming common femoral arterial access away from the previously placed Celt ACD closure device. Hemostasis was attained again with the aid of a second Celt ACD. IMPRESSION: 1. Successful planning hepatic arteriogram prior to Y 90 trans arterial radioembolization. Patient is a candidate for right semi lobar embolization sparing  segments 5 and 7. 2. Technetium macro gated albumin injection into the segment 8/6 hepatic artery. Electronically Signed   By: Jacqulynn Cadet M.D.   On: 03/24/2021 11:19   IR Angiogram Selective Each Additional Vessel  Result Date: 03/24/2021 INDICATION: 80 year old male with right-sided colon cancer metastatic to the liver. He has multifocal right hepatic metastatic lesions which have been progressive on recent imaging. He presents today for hepatic arteriography with possible embolization and technetium MAA injection prior to definitive Y 90 radio therapy. EXAM: IR EMBO ARTERIAL NOT HEMORR HEMANG INC GUIDE ROADMAPPING; IR ULTRASOUND GUIDANCE VASC ACCESS RIGHT; SELECTIVE VISCERAL ARTERIOGRAPHY; ADDITIONAL ARTERIOGRAPHY MEDICATIONS: None. ANESTHESIA/SEDATION: Moderate (conscious) sedation was employed during this procedure. A total of Versed 2.5 mg and Fentanyl 100 mcg was administered intravenously. Moderate Sedation Time: 47 minutes. The patient's level of consciousness and vital signs were monitored continuously by radiology nursing throughout the procedure under my direct supervision. CONTRAST:  39mL OMNIPAQUE IOHEXOL 300 MG/ML  SOLN FLUOROSCOPY TIME:  Fluoroscopy Time: 9 minutes 12 seconds (556 mGy). COMPLICATIONS:  None immediate. PROCEDURE: Informed consent was obtained from the patient following explanation of the procedure, risks, benefits and alternatives. The patient understands, agrees and consents for the procedure. All questions were addressed. A time out was performed prior to the initiation of the procedure. Maximal barrier sterile technique utilized including caps, mask, sterile gowns, sterile gloves, large sterile drape, hand hygiene, and Betadine prep. The right common femoral artery was interrogated with ultrasound and found to be widely patent. An image was obtained and stored for the medical record. Local anesthesia was attained by infiltration with 1% lidocaine. A small dermatotomy was made. Under real-time sonographic guidance, the vessel was punctured with a 21 gauge micropuncture needle. Using standard technique, the initial micro needle was exchanged over a 0.018 micro wire for a transitional 4 [place] micro sheath. The micro sheath was then exchanged over a 0.035 wire for a 5 French vascular sheath. A C2 cobra catheter was advanced over a Bentson wire into the abdominal aorta and used to select the celiac artery. A celiac arteriogram was performed. Conventional hepatic arterial anatomy. The C2 cobra catheter was advanced over a Glidewire into the common hepatic artery. Additional arteriography was performed in multiple obliquities. Contrast injection confirms 3 large arterially enhancing lesions in the right hemi liver consistent with the recent prior MRI. No evidence of lesion in the left hepatic lobe. A renegade STC microcatheter was advanced coaxially through the 5 French sheath using a Fathom 16 wire. The catheter was advanced into the first branch artery arising from the right hepatic artery, this appears to be the segment 5/7 branch. Contrast injection was performed. However, there was reflux into the main right hepatic artery. Therefore, the microcatheter was advanced more distally in the segment  5/7 branch. Repeat contrast injection was performed. No definitive supply of the arterially enhancing lesions arising from this branch artery. Microcatheter was brought back and advanced into the right main hepatic artery. Arteriography was performed. Excellent visualization of the tumor blush of all 3 lesions. No visceral branch arteries. No evidence of other arterial variance. Technetium macro aggregated albumin was then injected. The catheter system was removed and appropriately disposed of. A limited right common femoral arteriogram was performed confirming common femoral arterial access away from the previously placed Celt ACD closure device. Hemostasis was attained again with the aid of a second Celt ACD. IMPRESSION: 1. Successful planning hepatic arteriogram prior to Y 90 trans arterial radioembolization. Patient is a candidate  for right semi lobar embolization sparing segments 5 and 7. 2. Technetium macro gated albumin injection into the segment 8/6 hepatic artery. Electronically Signed   By: Jacqulynn Cadet M.D.   On: 03/24/2021 11:19   NM Special Med Rad Physics Cons  Result Date: 04/04/2021 CLINICAL DATA:  History of colon cancer with unresectable liver metastasis. EXAM: NUCLEAR MEDICINE SPECIAL MED RAD PHYSICS CONS; NUCLEAR MEDICINE RADIO PHARM THERAPY INTRA ARTERIAL; NUCLEAR MEDICINE TREATMENT PROCEDURE; NUCLEAR MEDICINE LIVER SCAN TECHNIQUE: In conjunction with the interventional radiologist a Y- Microsphere dose was calculated utilizing body surface area formulation. Calculated dose equal 35.1 mCi. Pre therapy MAA liver SPECT scan and CTA were evaluated. Utilizing a microcatheter system, the hepatic artery was selected and Y-90 microspheres were delivered in fractionated aliquots. Radiopharmaceutical was delivered by the interventional radiologist and nuclear radiologist. The patient tolerated procedure well. No adverse effects were noted. Bremsstrahlung planar and SPECT imaging of the abdomen  following intrahepatic arterial delivery of Y-90 microsphere was performed. RADIOPHARMACEUTICALS:  36.2 MCI Y-90 MICROSPHERES INSTILLED VIA INTRAHEPATIC ARTERY CATHETER COMPARISON:  02/27/2021 FINDINGS: Y - 90 microspheres therapy as above. First therapy the right hepatic lobe. Bremsstrahlung planar and SPECT imaging of the abdomen following intrahepatic arterial delivery of Y-59microsphere demonstrates radioactivity localized to the right hepatic lobe. No evidence of extrahepatic activity. IMPRESSION: Successful Y - 90 microsphere delivery for treatment of unresectable liver metastasis. First therapy to the right lobe. Bremssstrahlung scan demonstrates activity localized to right hepatic lobe with no extrahepatic activity identified. Electronically Signed   By: Kerby Moors M.D.   On: 04/04/2021 14:55   NM Special Treatment Procedure  Result Date: 04/04/2021 CLINICAL DATA:  History of colon cancer with unresectable liver metastasis. EXAM: NUCLEAR MEDICINE SPECIAL MED RAD PHYSICS CONS; NUCLEAR MEDICINE RADIO PHARM THERAPY INTRA ARTERIAL; NUCLEAR MEDICINE TREATMENT PROCEDURE; NUCLEAR MEDICINE LIVER SCAN TECHNIQUE: In conjunction with the interventional radiologist a Y- Microsphere dose was calculated utilizing body surface area formulation. Calculated dose equal 35.1 mCi. Pre therapy MAA liver SPECT scan and CTA were evaluated. Utilizing a microcatheter system, the hepatic artery was selected and Y-90 microspheres were delivered in fractionated aliquots. Radiopharmaceutical was delivered by the interventional radiologist and nuclear radiologist. The patient tolerated procedure well. No adverse effects were noted. Bremsstrahlung planar and SPECT imaging of the abdomen following intrahepatic arterial delivery of Y-90 microsphere was performed. RADIOPHARMACEUTICALS:  36.2 MCI Y-90 MICROSPHERES INSTILLED VIA INTRAHEPATIC ARTERY CATHETER COMPARISON:  02/27/2021 FINDINGS: Y - 90 microspheres therapy as above. First  therapy the right hepatic lobe. Bremsstrahlung planar and SPECT imaging of the abdomen following intrahepatic arterial delivery of Y-80microsphere demonstrates radioactivity localized to the right hepatic lobe. No evidence of extrahepatic activity. IMPRESSION: Successful Y - 90 microsphere delivery for treatment of unresectable liver metastasis. First therapy to the right lobe. Bremssstrahlung scan demonstrates activity localized to right hepatic lobe with no extrahepatic activity identified. Electronically Signed   By: Kerby Moors M.D.   On: 04/04/2021 14:55   IR US Guide Vasc Access Right  Result Date: 04/04/2021 INDICATION: 80 year old male with right-sided colon adenocarcinoma metastatic to the liver. He has multifocal disease in the right hepatic lobe and presents today for right lobar trans arterial radio embolization with Y90. EXAM: IR EMBO TUMOR ORGAN ISCHEMIA INFARCT INC GUIDE ROADMAPPING; SELECTIVE VISCERAL ARTERIOGRAPHY; ADDITIONAL ARTERIOGRAPHY; IR ULTRASOUND GUIDANCE VASC ACCESS RIGHT MEDICATIONS: 3.375 g Zosyn; 12.5 mg Benadryl. The antibiotic was administered within 1 hour of the procedure ANESTHESIA/SEDATION: Moderate (conscious) sedation was employed during this procedure. A total of  Versed 4 mg and Fentanyl 100 mcg was administered intravenously. Moderate Sedation Time: 52 minutes. The patient's level of consciousness and vital signs were monitored continuously by radiology nursing throughout the procedure under my direct supervision. CONTRAST:  50 mL Omnipaque 300 FLUOROSCOPY TIME:  Fluoroscopy Time: 5 minutes 12 seconds (511 mGy). COMPLICATIONS: None immediate. PROCEDURE: Informed consent was obtained from the patient following explanation of the procedure, risks, benefits and alternatives. The patient understands, agrees and consents for the procedure. All questions were addressed. A time out was performed prior to the initiation of the procedure. Maximal barrier sterile technique  utilized including caps, mask, sterile gowns, sterile gloves, large sterile drape, hand hygiene, and Betadine prep. The right common femoral artery was interrogated with ultrasound and found to be widely patent. An image was obtained and stored for the medical record. Local anesthesia was attained by infiltration with 1% lidocaine. A small dermatotomy was made. Under real-time sonographic guidance, the vessel was punctured with a 21 gauge micropuncture needle. Using standard technique, the initial micro needle was exchanged over a 0.018 micro wire for a transitional 4 [place] micro sheath. The micro sheath was then exchanged over a 0.035 wire for a 5 French vascular sheath. A C2 cobra catheter was advanced over a Bentson wire into the abdominal aorta and used to select the superior mesenteric artery. A superior mesenteric arteriogram was performed. No evidence of accessory or replaced right hepatic artery. The portal vein remains patent. The C2 cobra catheter was next advanced into the celiac artery. A celiac arteriogram was performed to provide a map for catheterization of the common hepatic artery. The C2 catheter was then advanced over a Glidewire into the common hepatic artery. Arteriography was then performed in multiple obliquities. The origins of the left hepatic artery, segment 5/7 hepatic artery and main right hepatic artery were identified. A 2.5 French Cook Cantata microcatheter was advanced over a Fathom 16 wire in used to select the left hepatic artery. Arteriography was performed. No definite evidence of tumoral blush. The microcatheter was next advanced beyond the segment 5/7 branch and into the dominant right hepatic artery. Contrast injection was performed confirming on precise location of the microcatheter tip as well as multifocal tumoral blush consistent with known multifocal hepatic metastatic disease. The microcatheter was secured in place. Trans arterial radio embolization was then performed  using our standard protocol. Resin Y 90 labeled microspheres were injected in aliquots. Antegrade flow was assessed multiple times throughout the duration of the injection confirming persistent antegrade flow and absence of reflux. Once the entire Y 90 dose was administered, the microcatheter was brought back into the 5 [place] base catheter and the catheter system removed as a unit and appropriately disposed of. Hemostasis was then attained with the assistance of a Celt arterial closure device. IMPRESSION: Successful right lobar trans arterial radio embolization with Y90 sparing hepatic segments 5 and 7. Signed, Criselda Peaches, MD, [facility]iology Electronically Signed   By: Jacqulynn Cadet M.D.   On: 04/04/2021 11:58   IR US Guide Vasc Access Right  Result Date: 03/24/2021 INDICATION: 80 year old male with right-sided colon cancer metastatic to the liver. He has multifocal right hepatic metastatic lesions which have been progressive on recent imaging. He presents today for hepatic arteriography with possible embolization and technetium MAA injection prior to definitive Y 90 radio therapy. EXAM: IR EMBO ARTERIAL NOT HEMORR HEMANG INC GUIDE ROADMAPPING; IR ULTRASOUND GUIDANCE VASC ACCESS RIGHT; SELECTIVE VISCERAL ARTERIOGRAPHY; ADDITIONAL  ARTERIOGRAPHY MEDICATIONS: None. ANESTHESIA/SEDATION: Moderate (conscious) sedation was employed during this procedure. A total of Versed 2.5 mg and Fentanyl 100 mcg was administered intravenously. Moderate Sedation Time: 47 minutes. The patient's level of consciousness and vital signs were monitored continuously by radiology nursing throughout the procedure under my direct supervision. CONTRAST:  58mL OMNIPAQUE IOHEXOL 300 MG/ML  SOLN FLUOROSCOPY TIME:  Fluoroscopy Time: 9 minutes 12 seconds (556 mGy). COMPLICATIONS: None immediate. PROCEDURE: Informed consent was obtained from the patient following explanation  of the procedure, risks, benefits and alternatives. The patient understands, agrees and consents for the procedure. All questions were addressed. A time out was performed prior to the initiation of the procedure. Maximal barrier sterile technique utilized including caps, mask, sterile gowns, sterile gloves, large sterile drape, hand hygiene, and Betadine prep. The right common femoral artery was interrogated with ultrasound and found to be widely patent. An image was obtained and stored for the medical record. Local anesthesia was attained by infiltration with 1% lidocaine. A small dermatotomy was made. Under real-time sonographic guidance, the vessel was punctured with a 21 gauge micropuncture needle. Using standard technique, the initial micro needle was exchanged over a 0.018 micro wire for a transitional 4 [place] micro sheath. The micro sheath was then exchanged over a 0.035 wire for a 5 French vascular sheath. A C2 cobra catheter was advanced over a Bentson wire into the abdominal aorta and used to select the celiac artery. A celiac arteriogram was performed. Conventional hepatic arterial anatomy. The C2 cobra catheter was advanced over a Glidewire into the common hepatic artery. Additional arteriography was performed in multiple obliquities. Contrast injection confirms 3 large arterially enhancing lesions in the right hemi liver consistent with the recent prior MRI. No evidence of lesion in the left hepatic lobe. A renegade STC microcatheter was advanced coaxially through the 5 French sheath using a Fathom 16 wire. The catheter was advanced into the first branch artery arising from the right hepatic artery, this appears to be the segment 5/7 branch. Contrast injection was performed. However, there was reflux into the main right hepatic artery. Therefore, the microcatheter was advanced more distally in the segment 5/7 branch. Repeat contrast injection was performed. No definitive supply of the arterially  enhancing lesions arising from this branch artery. Microcatheter was brought back and advanced into the right main hepatic artery. Arteriography was performed. Excellent visualization of the tumor blush of all 3 lesions. No visceral branch arteries. No evidence of other arterial variance. Technetium macro aggregated albumin was then injected. The catheter system was removed and appropriately disposed of. A limited right common femoral arteriogram was performed confirming common femoral arterial access away from the previously placed Celt ACD closure device. Hemostasis was attained again with the aid of a second Celt ACD. IMPRESSION: 1. Successful planning hepatic arteriogram prior to Y 90 trans arterial radioembolization. Patient is a candidate for right semi lobar embolization sparing segments 5 and 7. 2. Technetium macro gated albumin injection into the segment 8/6 hepatic artery. Electronically Signed   By: Jacqulynn Cadet M.D.   On: 03/24/2021 11:19   IR EMBO ARTERIAL NOT HEMORR HEMANG INC GUIDE ROADMAPPING  Result Date: 03/24/2021 INDICATION: 80 year old male with right-sided colon cancer metastatic to the liver. He has multifocal right hepatic metastatic lesions which have been progressive on recent imaging. He presents today for hepatic arteriography with possible embolization and technetium MAA injection prior to definitive Y 90 radio therapy. EXAM: IR EMBO ARTERIAL NOT HEMORR HEMANG INC GUIDE ROADMAPPING; IR  ULTRASOUND GUIDANCE VASC ACCESS RIGHT; SELECTIVE VISCERAL ARTERIOGRAPHY; ADDITIONAL ARTERIOGRAPHY MEDICATIONS: None. ANESTHESIA/SEDATION: Moderate (conscious) sedation was employed during this procedure. A total of Versed 2.5 mg and Fentanyl 100 mcg was administered intravenously. Moderate Sedation Time: 47 minutes. The patient's level of consciousness and vital signs were monitored continuously by radiology nursing throughout the procedure under my direct supervision. CONTRAST:  78mL OMNIPAQUE  IOHEXOL 300 MG/ML  SOLN FLUOROSCOPY TIME:  Fluoroscopy Time: 9 minutes 12 seconds (556 mGy). COMPLICATIONS: None immediate. PROCEDURE: Informed consent was obtained from the patient following explanation of the procedure, risks, benefits and alternatives. The patient understands, agrees and consents for the procedure. All questions were addressed. A time out was performed prior to the initiation of the procedure. Maximal barrier sterile technique utilized including caps, mask, sterile gowns, sterile gloves, large sterile drape, hand hygiene, and Betadine prep. The right common femoral artery was interrogated with ultrasound and found to be widely patent. An image was obtained and stored for the medical record. Local anesthesia was attained by infiltration with 1% lidocaine. A small dermatotomy was made. Under real-time sonographic guidance, the vessel was punctured with a 21 gauge micropuncture needle. Using standard technique, the initial micro needle was exchanged over a 0.018 micro wire for a transitional 4 [place] micro sheath. The micro sheath was then exchanged over a 0.035 wire for a 5 French vascular sheath. A C2 cobra catheter was advanced over a Bentson wire into the abdominal aorta and used to select the celiac artery. A celiac arteriogram was performed. Conventional hepatic arterial anatomy. The C2 cobra catheter was advanced over a Glidewire into the common hepatic artery. Additional arteriography was performed in multiple obliquities. Contrast injection confirms 3 large arterially enhancing lesions in the right hemi liver consistent with the recent prior MRI. No evidence of lesion in the left hepatic lobe. A renegade STC microcatheter was advanced coaxially through the 5 French sheath using a Fathom 16 wire. The catheter was advanced into the first branch artery arising from the right hepatic artery, this appears to be the segment 5/7 branch. Contrast injection was performed. However, there was reflux  into the main right hepatic artery. Therefore, the microcatheter was advanced more distally in the segment 5/7 branch. Repeat contrast injection was performed. No definitive supply of the arterially enhancing lesions arising from this branch artery. Microcatheter was brought back and advanced into the right main hepatic artery. Arteriography was performed. Excellent visualization of the tumor blush of all 3 lesions. No visceral branch arteries. No evidence of other arterial variance. Technetium macro aggregated albumin was then injected. The catheter system was removed and appropriately disposed of. A limited right common femoral arteriogram was performed confirming common femoral arterial access away from the previously placed Celt ACD closure device. Hemostasis was attained again with the aid of a second Celt ACD. IMPRESSION: 1. Successful planning hepatic arteriogram prior to Y 90 trans arterial radioembolization. Patient is a candidate for right semi lobar embolization sparing segments 5 and 7. 2. Technetium macro gated albumin injection into the segment 8/6 hepatic artery. Electronically Signed   By: Jacqulynn Cadet M.D.   On: 03/24/2021 11:19   IR EMBO TUMOR ORGAN ISCHEMIA INFARCT INC GUIDE ROADMAPPING  Result Date: 04/04/2021 INDICATION: 80 year old male with right-sided colon adenocarcinoma metastatic to the liver. He has multifocal disease in the right hepatic lobe and presents today for right lobar trans arterial radio embolization with Y90. EXAM: IR EMBO TUMOR ORGAN ISCHEMIA INFARCT INC GUIDE ROADMAPPING; SELECTIVE VISCERAL ARTERIOGRAPHY; ADDITIONAL ARTERIOGRAPHY;  IR ULTRASOUND GUIDANCE VASC ACCESS RIGHT MEDICATIONS: 3.375 g Zosyn; 12.5 mg Benadryl. The antibiotic was administered within 1 hour of the procedure ANESTHESIA/SEDATION: Moderate (conscious) sedation was employed during this procedure. A total of Versed 4 mg and Fentanyl 100 mcg was administered intravenously. Moderate Sedation Time: 52  minutes. The patient's level of consciousness and vital signs were monitored continuously by radiology nursing throughout the procedure under my direct supervision. CONTRAST:  50 mL Omnipaque 300 FLUOROSCOPY TIME:  Fluoroscopy Time: 5 minutes 12 seconds (511 mGy). COMPLICATIONS: None immediate. PROCEDURE: Informed consent was obtained from the patient following explanation of the procedure, risks, benefits and alternatives. The patient understands, agrees and consents for the procedure. All questions were addressed. A time out was performed prior to the initiation of the procedure. Maximal barrier sterile technique utilized including caps, mask, sterile gowns, sterile gloves, large sterile drape, hand hygiene, and Betadine prep. The right common femoral artery was interrogated with ultrasound and found to be widely patent. An image was obtained and stored for the medical record. Local anesthesia was attained by infiltration with 1% lidocaine. A small dermatotomy was made. Under real-time sonographic guidance, the vessel was punctured with a 21 gauge micropuncture needle. Using standard technique, the initial micro needle was exchanged over a 0.018 micro wire for a transitional 4 [place] micro sheath. The micro sheath was then exchanged over a 0.035 wire for a 5 French vascular sheath. A C2 cobra catheter was advanced over a Bentson wire into the abdominal aorta and used to select the superior mesenteric artery. A superior mesenteric arteriogram was performed. No evidence of accessory or replaced right hepatic artery. The portal vein remains patent. The C2 cobra catheter was next advanced into the celiac artery. A celiac arteriogram was performed to provide a map for catheterization of the common hepatic artery. The C2 catheter was then advanced over a Glidewire into the common hepatic artery. Arteriography was then performed in multiple obliquities. The origins of the left hepatic artery, segment 5/7 hepatic artery  and main right hepatic artery were identified. A 2.5 French Cook Cantata microcatheter was advanced over a Fathom 16 wire in used to select the left hepatic artery. Arteriography was performed. No definite evidence of tumoral blush. The microcatheter was next advanced beyond the segment 5/7 branch and into the dominant right hepatic artery. Contrast injection was performed confirming on precise location of the microcatheter tip as well as multifocal tumoral blush consistent with known multifocal hepatic metastatic disease. The microcatheter was secured in place. Trans arterial radio embolization was then performed using our standard protocol. Resin Y 90 labeled microspheres were injected in aliquots. Antegrade flow was assessed multiple times throughout the duration of the injection confirming persistent antegrade flow and absence of reflux. Once the entire Y 90 dose was administered, the microcatheter was brought back into the 5 [place] base catheter and the catheter system removed as a unit and appropriately disposed of. Hemostasis was then attained with the assistance of a Celt arterial closure device. IMPRESSION: Successful right lobar trans arterial radio embolization with Y90 sparing hepatic segments 5 and 7. Signed, Criselda Peaches, MD, [facility] [facility] Radiology Electronically Signed   By: Jacqulynn Cadet M.D.   On: 04/04/2021 11:58   NM Radio Pharm Therapy Intraarterial  Result Date: 04/04/2021 CLINICAL DATA:  History of colon cancer with unresectable liver metastasis. EXAM: NUCLEAR MEDICINE SPECIAL MED RAD PHYSICS CONS; NUCLEAR MEDICINE RADIO PHARM THERAPY INTRA ARTERIAL; NUCLEAR MEDICINE TREATMENT PROCEDURE; NUCLEAR  MEDICINE LIVER SCAN TECHNIQUE: In conjunction with the interventional radiologist a Y- Microsphere dose was calculated utilizing body surface area formulation. Calculated dose equal 35.1 mCi. Pre therapy MAA liver SPECT scan and CTA  were evaluated. Utilizing a microcatheter system, the hepatic artery was selected and Y-90 microspheres were delivered in fractionated aliquots. Radiopharmaceutical was delivered by the interventional radiologist and nuclear radiologist. The patient tolerated procedure well. No adverse effects were noted. Bremsstrahlung planar and SPECT imaging of the abdomen following intrahepatic arterial delivery of Y-90 microsphere was performed. RADIOPHARMACEUTICALS:  36.2 MCI Y-90 MICROSPHERES INSTILLED VIA INTRAHEPATIC ARTERY CATHETER COMPARISON:  02/27/2021 FINDINGS: Y - 90 microspheres therapy as above. First therapy the right hepatic lobe. Bremsstrahlung planar and SPECT imaging of the abdomen following intrahepatic arterial delivery of Y-31microsphere demonstrates radioactivity localized to the right hepatic lobe. No evidence of extrahepatic activity. IMPRESSION: Successful Y - 90 microsphere delivery for treatment of unresectable liver metastasis. First therapy to the right lobe. Bremssstrahlung scan demonstrates activity localized to right hepatic lobe with no extrahepatic activity identified. Electronically Signed   By: Kerby Moors M.D.   On: 04/04/2021 14:55   NM FUSION  Result Date: 03/24/2021 CLINICAL DATA:  History of colon cancer with unresectable liver metastases. EXAM: NUCLEAR MEDICINE LIVER SCAN; ULTRASOUND MISCELLANEOUS SOFT TISSUE TECHNIQUE: Abdominal images were obtained in multiple projections after intrahepatic arterial injection of radiopharmaceutical. SPECT imaging was performed. Lung shunt calculation was performed. RADIOPHARMACEUTICALS:  4.3millicurie MAA TECHNETIUM TO 58M ALBUMIN AGGREGATED COMPARISON:  MRI 02/27/21 FINDINGS: The injected microaggregated albumin localizes within the liver. No evidence of activity within the stomach, duodenum, or bowel. Calculated shunt fraction to the lungs equals 5.8%. IMPRESSION: 1. No significant extrahepatic radiotracer activity following intrahepatic  arterial injection of MAA. 2. Lung shunt fraction equals 5.8% Electronically Signed   By: Kerby Moors M.D.   On: 03/24/2021 14:26     ASSESSMENT & PLAN:   Cancer of right colon ([place]) #Right-sided colon adenocarcinoma-with synchronous metastasis to liver/unresectable. Currently on FOLFIRI plus Avastin on HOLD; MRI FEB 2022- over stable liver disease disease; but peptic ulcer [see below];   # s/p Ablation- MAY 2022- Multiple lvier lesions/progression- awaiting Y 90 on 06/17...  # HOLD further chemo [sec to poor tolerance]; currently s/p liver ablation on 3/10; s/p micorwave ablation  On 3/30;  Recommend imaging in 2 to 3 months posttreatment. will discuss with IR regarding timing of imaging.  Continue to hold systemic chemotherapy at this time.  If progressive disease noted would recommend- TAS 102 vs. Regorafenib.   # Peptic ulcer disease/duodenal ulcer-NSAID related.  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 ]  # DISPOSITION:   # follow up in 2nd week of July- MD; labs- cbc/cmp CEA/port flush- Dr.B  All questions were answered. The patient knows to call the clinic with any problems, questions or concerns.    Cammie Sickle, MD 04/13/2021 8:31 PM

## 2021-04-01 NOTE — Assessment & Plan Note (Addendum)
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  Most recently s/p  FOLFIRI plus Avastin on HOLD-given local therapies to liver.  s/p Ablation- MAY 2022 MRI- Multiple lvier lesions/progression- awaiting Y 90 on 06/17.   #Discussed given the progressive disease noted on imaging-would recommend systemic therapy with TAS 102/ regorafenib.  We will plan to start prescription the next visit.  # Peptic ulcer disease/duodenal ulcer-NSAID related.  S/p EGD on 2/16-continue PPI.- Dr.Anna.  Discussed that MRI is not a useful test to assess peptic ulcer disease.  # PN- G-1-2; sec to oxaliplatin-stable  # Mediport- port flush q 2-3 months  # Shoulder pain-refill oxycodone Silver.Hawking ]  # DISPOSITION:  # follow up in 2nd week of July- MD; labs- cbc/cmp CEA/port flush- Dr.B  # I reviewed the blood work- with the patient in detail; also reviewed the imaging independently [as summarized above]; and with the patient in detail.

## 2021-04-02 LAB — CEA: CEA: 38.4 ng/mL — ABNORMAL HIGH (ref 0.0–4.7)

## 2021-04-03 ENCOUNTER — Other Ambulatory Visit: Payer: Self-pay | Admitting: Radiology

## 2021-04-03 NOTE — H&P (Signed)
HPI:  The patient has had a H&P performed within the last 30 days, all history, medications, and exam have been reviewed. The patient denies any interval changes since the H&P.  Medications: Prior to Admission medications   Medication Sig Start Date End Date Taking? Authorizing Provider  ondansetron (ZOFRAN) 4 MG tablet Take 1 tablet (4 mg total) by mouth every 8 (eight) hours as needed for nausea or vomiting. Patient not taking: No sig reported 01/15/21   Ardis Rowan, PA-C  ondansetron [facility]) 8 MG tablet Take 1 tablet (8 mg total) by mouth every 8 (eight) hours as needed for nausea or vomiting. Patient not taking: No sig reported 12/19/20   Louk, Bea Graff, PA-C  oxyCODONE (OXY IR/ROXICODONE) 5 MG immediate release tablet Take 1 tablet (5 mg total) by mouth every 6 (six) hours as needed for severe pain. Patient not taking: Reported on 04/01/2021 01/27/21   Borders, Kirt Boys, NP  pantoprazole (PROTONIX) 40 MG tablet Take 1 tablet (40 mg total) by mouth 2 (two) times daily. Patient not taking: No sig reported 11/27/20   Cammie Sickle, MD  pravastatin (PRAVACHOL) 40 MG tablet Take 40 mg by mouth daily.     [provider]  prochlorperazine (COMPAZINE) 10 MG tablet Take 1 tablet (10 mg total) by mouth every 6 (six) hours as needed for nausea or vomiting. Patient not taking: No sig reported 11/21/20   Borders, Kirt Boys, NP  sucralfate (CARAFATE) 1 g tablet TAKE 1 TABLET (1 G TOTAL) BY MOUTH 4 (FOUR) TIMES DAILY - WITH MEALS AND AT BEDTIME. 03/05/21   Cammie Sickle, MD  tamsulosin (FLOMAX) 0.4 MG CAPS capsule Take 1 capsule (0.4 mg total) by mouth daily. 01/27/21 01/27/22  Borders, Kirt Boys, NP     Vital Signs: BP (!) 152/78   Pulse 74   Temp 97.9 F (36.6 C) (Oral)   Resp 20   SpO2 99%   Physical Exam Vitals reviewed.  Constitutional:      General: He is not in acute distress.    Appearance: Normal appearance. He is not ill-appearing.  HENT:     Head:  Normocephalic and atraumatic.     Mouth/Throat:     Mouth: Mucous membranes are moist.  Cardiovascular:     Rate and Rhythm: Normal rate and regular rhythm.     Pulses: Normal pulses.     Heart sounds: Normal heart sounds.  Pulmonary:     Effort: Pulmonary effort is normal.     Breath sounds: Normal breath sounds.  Abdominal:     General: Abdomen is flat. Bowel sounds are normal.     Palpations: Abdomen is soft.  Musculoskeletal:     Cervical back: Neck supple.  Skin:    General: Skin is warm and dry.     Coloration: Skin is not jaundiced or pale.  Neurological:     Mental Status: He is alert and oriented to person, place, and time.  Psychiatric:        Mood and Affect: Mood normal.        Behavior: Behavior normal.        Judgment: Judgment normal.    Mallampati Score:  MD Evaluation Airway: WNL Heart: WNL Abdomen: WNL Chest/ Lungs: WNL ASA  Classification: 3 Mallampati/Airway Score: One  Labs:  CBC: Recent Labs    01/28/21 1002 03/24/21 0825 04/01/21 1002 04/04/21 0811  WBC 5.3 5.8 6.1 5.6  HGB 11.8* 12.7* 12.1* 11.7*  HCT 35.5* 38.5* 35.0*  35.6*  PLT 163 153 148* 161    COAGS: Recent Labs    12/19/20 0954 01/15/21 0739 03/24/21 0825 04/04/21 0811  INR 1.0 0.9 1.1 1.2  APTT  --   --   --  40*    BMP: Recent Labs    06/10/20 0858 06/25/20 0841 07/08/20 0843 07/22/20 0836 08/05/20 0821 01/28/21 1002 03/24/21 0825 04/01/21 1002 04/04/21 0811  NA 138 138 139 138   < > 139 141 135 142  K 3.7 3.6 3.6 3.6   < > 3.8 4.0 3.6 3.6  CL 107 108 108 107   < > 105 107 102 110  CO2 25 24 25 25    < > 27 27 25 26   GLUCOSE 205* 201* 209* 195*   < > 119* 126* 216* 121*  BUN 11 10 9 10    < > 11 13 13 15   CALCIUM 8.2* 8.0* 8.0* 8.1*   < > 8.6* 9.1 8.5* 8.9  CREATININE 0.89 0.80 0.70 0.89   < > 0.75 0.89 0.88 0.86  GFRNONAA >60 >60 >60 >60   < > >60 >60 >60 >60  GFRAA >60 >60 >60 >60  --   --   --   --   --    < > = values in this interval not displayed.     LIVER FUNCTION TESTS: Recent Labs    01/28/21 1002 03/24/21 0825 04/01/21 1002 04/04/21 0811  BILITOT 0.5 0.9 0.7 0.8  AST 30 28 27 28   ALT 19 17 16 16   ALKPHOS 119 110 111 116  PROT 7.0 7.5 6.9 7.0  ALBUMIN 3.3* 3.9 3.3* 3.6    Assessment/Plan:  80 year old male with history of metastatic colon cancer to the liver well-known to IR service for previous Port-A-Cath placement, bland embolization of liver lesions on 12/19/2020, microwave ablation of left liver mass on 01/15/2021, hepatic arterial road mapping with possible embolization and Y 90 test dosing on 03/24/2021.   After recent consultation with Dr. Laurence Ferrari, patient was deemed an appropriate candidate for Y 90 radioembolization. Patient presents today to [facility] IR for the procedure. N.p.o. since midnight VSS Not on anticoagulation or antiplatelet treatment. INR 1.2  APTT elevated, 40  CBC stable  CMP stable, LFT normal    Risks and benefits discussed with the patient including, but not limited to bleeding, infection, vascular injury, post procedural pain, nausea, vomiting and fatigue, contrast induced renal failure, liver failure, radiation injury to the bowel, radiation induced cholecystitis, neutropenia and possible need for additional procedures.  All of the patient's questions were answered, patient is agreeable to proceed. Consent signed and in chart.    Signed: Tera Mater 04/04/2021, 8:46 AM

## 2021-04-04 ENCOUNTER — Ambulatory Visit (HOSPITAL_COMMUNITY)
Admission: RE | Admit: 2021-04-04 | Discharge: 2021-04-04 | Disposition: A | Discharge status: Home / Self Care | Payer: Medicare HMO | Source: Ambulatory Visit | Attending: Interventional Radiology | Admitting: Interventional Radiology

## 2021-04-04 ENCOUNTER — Encounter (HOSPITAL_COMMUNITY): Payer: Self-pay

## 2021-04-04 ENCOUNTER — Encounter (HOSPITAL_COMMUNITY)
Admission: RE | Admit: 2021-04-04 | Discharge: 2021-04-04 | Disposition: A | Discharge status: Home / Self Care | Payer: Medicare HMO | Source: Ambulatory Visit | Attending: Interventional Radiology | Admitting: Interventional Radiology

## 2021-04-04 ENCOUNTER — Other Ambulatory Visit: Payer: Self-pay

## 2021-04-04 ENCOUNTER — Other Ambulatory Visit (HOSPITAL_COMMUNITY): Payer: Self-pay | Admitting: Interventional Radiology

## 2021-04-04 DIAGNOSIS — C189 Malignant neoplasm of colon, unspecified: Secondary | ICD-10-CM | POA: Insufficient documentation

## 2021-04-04 DIAGNOSIS — C787 Secondary malignant neoplasm of liver and intrahepatic bile duct: Secondary | ICD-10-CM

## 2021-04-04 DIAGNOSIS — Z79899 Other long term (current) drug therapy: Secondary | ICD-10-CM | POA: Diagnosis not present

## 2021-04-04 DIAGNOSIS — Z85038 Personal history of other malignant neoplasm of large intestine: Secondary | ICD-10-CM | POA: Diagnosis not present

## 2021-04-04 HISTORY — PX: IR ANGIOGRAM SELECTIVE EACH ADDITIONAL VESSEL: IMG667

## 2021-04-04 HISTORY — PX: IR EMBO TUMOR ORGAN ISCHEMIA INFARCT INC GUIDE ROADMAPPING: IMG5449

## 2021-04-04 HISTORY — PX: IR ANGIOGRAM VISCERAL SELECTIVE: IMG657

## 2021-04-04 HISTORY — PX: IR US GUIDE VASC ACCESS RIGHT: IMG2390

## 2021-04-04 LAB — COMPREHENSIVE METABOLIC PANEL
ALT: 16 U/L (ref 0–44)
AST: 28 U/L (ref 15–41)
Albumin: 3.6 g/dL (ref 3.5–5.0)
Alkaline Phosphatase: 116 U/L (ref 38–126)
Anion gap: 6 (ref 5–15)
BUN: 15 mg/dL (ref 8–23)
CO2: 26 mmol/L (ref 22–32)
Calcium: 8.9 mg/dL (ref 8.9–10.3)
Chloride: 110 mmol/L (ref 98–111)
Creatinine, Ser: 0.86 mg/dL (ref 0.61–1.24)
GFR, Estimated: >60 mL/min (ref >60)
Glucose, Bld: 121 mg/dL — ABNORMAL HIGH (ref 70–99)
Potassium: 3.6 mmol/L (ref 3.5–5.1)
Sodium: 142 mmol/L (ref 135–145)
Total Bilirubin: 0.8 mg/dL (ref 0.3–1.2)
Total Protein: 7 g/dL (ref 6.5–8.1)

## 2021-04-04 LAB — CBC
HCT: 35.6 % — ABNORMAL LOW (ref 39.0–52.0)
Hemoglobin: 11.7 g/dL — ABNORMAL LOW (ref 13.0–17.0)
MCH: 32 pg (ref 26.0–34.0)
MCHC: 32.9 g/dL (ref 30.0–36.0)
MCV: 97.3 fL (ref 80.0–100.0)
Platelets: 161 10*3/uL (ref 150–400)
RBC: 3.66 MIL/uL — ABNORMAL LOW (ref 4.22–5.81)
RDW: 14.4 % (ref 11.5–15.5)
WBC: 5.6 10*3/uL (ref 4.0–10.5)
nRBC: 0 % (ref 0.0–0.2)

## 2021-04-04 LAB — PROTIME-INR
INR: 1.2 (ref 0.8–1.2)
Prothrombin Time: 15 seconds (ref 11.4–15.2)

## 2021-04-04 LAB — APTT: aPTT: 40 seconds — ABNORMAL HIGH (ref 24–36)

## 2021-04-04 IMAGING — NM NM SPECIAL TREATMENT PROCEDURE
3 series · 18 of 18 positions shown · non-contrast
Comparison: [DATE]

CLINICAL DATA: History of colon cancer with unresectable liver
metastasis.

EXAM:
NUCLEAR MEDICINE SPECIAL MED RAD PHYSICS CONS; NUCLEAR MEDICINE
RADIO PHARM THERAPY INTRA ARTERIAL; NUCLEAR MEDICINE TREATMENT
PROCEDURE; NUCLEAR MEDICINE LIVER SCAN
TECHNIQUE: In conjunction with the interventional radiologist a Y- Microsphere
dose was calculated utilizing body surface area formulation.
Calculated dose equal 35.1 mCi. Pre therapy MAA liver SPECT scan and
CTA were evaluated. Utilizing a microcatheter system, the hepatic
artery was selected and Y-90 microspheres were delivered in
fractionated aliquots. Radiopharmaceutical was delivered by the
interventional radiologist and nuclear radiologist.
The patient tolerated procedure well. No adverse effects were noted.
Bremsstrahlung planar and SPECT imaging of the abdomen following
intrahepatic arterial delivery of Y-90 microsphere was performed.
RADIOPHARMACEUTICALS:  36.2 MCI Y-90 MICROSPHERES INSTILLED VIA
INTRAHEPATIC ARTERY CATHETER

[Series 1: spect - (id) _(id)_cor · 4.1mm · 4.14mm/px · 6 of 128 frames shown]
[frame 11/128]
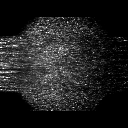
[frame 32/128]
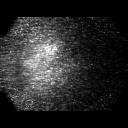
[frame 54/128]
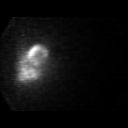
[frame 75/128]
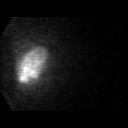
[frame 96/128]
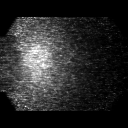
[frame 118/128]
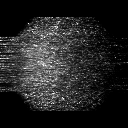

[Series 1: spect - (id) _(id)_tra · 4.1mm · 4.14mm/px · 6 of 128 frames shown]
[frame 11/128]
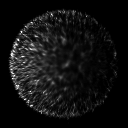
[frame 32/128]
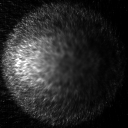
[frame 54/128]
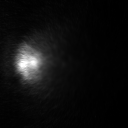
[frame 75/128]
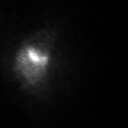
[frame 96/128]
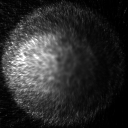
[frame 118/128]
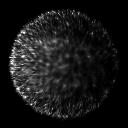

[Series 2: y-90 spect · 4.14mm/px · 6 of 64 frames shown]
[frame 6/64]
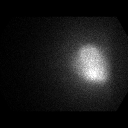
[frame 16/64]
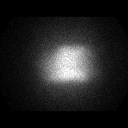
[frame 27/64]
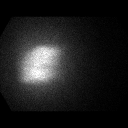
[frame 38/64]
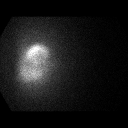
[frame 48/64]
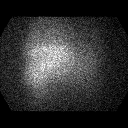
[frame 59/64]
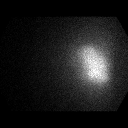

[18 of 18 positions shown; findings below may reference images not displayed]

FINDINGS: [AGE] microspheres therapy as above. First therapy the right
hepatic lobe.

Bremsstrahlung planar and SPECT imaging of the abdomen following
intrahepatic arterial delivery of [0C] demonstrates
radioactivity localized to the right hepatic lobe. No evidence of
extrahepatic activity.
IMPRESSION: Successful [AGE] microsphere delivery for treatment of unresectable
liver metastasis. First therapy to the right lobe.

Bremssstrahlung scan demonstrates activity localized to right
hepatic lobe with no extrahepatic activity identified.

## 2021-04-04 IMAGING — NM NM LIVER IMG SPECT
3 series · 18 of 18 positions shown · non-contrast
Comparison: [DATE]

CLINICAL DATA: History of colon cancer with unresectable liver
metastasis.

EXAM:
NUCLEAR MEDICINE SPECIAL MED RAD PHYSICS CONS; NUCLEAR MEDICINE
RADIO PHARM THERAPY INTRA ARTERIAL; NUCLEAR MEDICINE TREATMENT
PROCEDURE; NUCLEAR MEDICINE LIVER SCAN
TECHNIQUE: In conjunction with the interventional radiologist a Y- Microsphere
dose was calculated utilizing body surface area formulation.
Calculated dose equal 35.1 mCi. Pre therapy MAA liver SPECT scan and
CTA were evaluated. Utilizing a microcatheter system, the hepatic
artery was selected and Y-90 microspheres were delivered in
fractionated aliquots. Radiopharmaceutical was delivered by the
interventional radiologist and nuclear radiologist.
The patient tolerated procedure well. No adverse effects were noted.
Bremsstrahlung planar and SPECT imaging of the abdomen following
intrahepatic arterial delivery of Y-90 microsphere was performed.
RADIOPHARMACEUTICALS:  36.2 MCI Y-90 MICROSPHERES INSTILLED VIA
INTRAHEPATIC ARTERY CATHETER

[Series 1: spect - (id) _(id)_cor · 4.1mm · 4.14mm/px · 6 of 128 frames shown]
[frame 11/128]
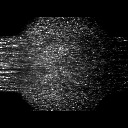
[frame 32/128]
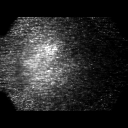
[frame 54/128]
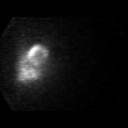
[frame 75/128]
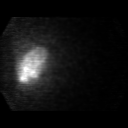
[frame 96/128]
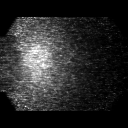
[frame 118/128]
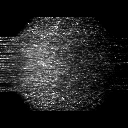

[Series 1: spect - (id) _(id)_tra · 4.1mm · 4.14mm/px · 6 of 128 frames shown]
[frame 11/128]
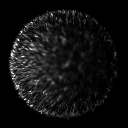
[frame 32/128]
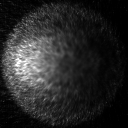
[frame 54/128]
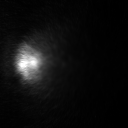
[frame 75/128]
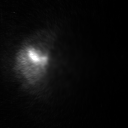
[frame 96/128]
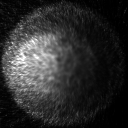
[frame 118/128]
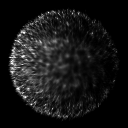

[Series 2: y-90 spect · 4.14mm/px · 6 of 64 frames shown]
[frame 6/64]
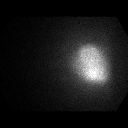
[frame 16/64]
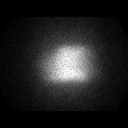
[frame 27/64]
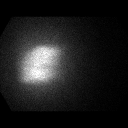
[frame 38/64]
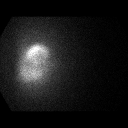
[frame 48/64]
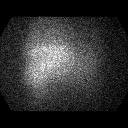
[frame 59/64]
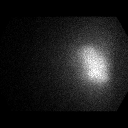

[18 of 18 positions shown; findings below may reference images not displayed]

FINDINGS: [AGE] microspheres therapy as above. First therapy the right
hepatic lobe.

Bremsstrahlung planar and SPECT imaging of the abdomen following
intrahepatic arterial delivery of [0C] demonstrates
radioactivity localized to the right hepatic lobe. No evidence of
extrahepatic activity.
IMPRESSION: Successful [AGE] microsphere delivery for treatment of unresectable
liver metastasis. First therapy to the right lobe.

Bremssstrahlung scan demonstrates activity localized to right
hepatic lobe with no extrahepatic activity identified.

## 2021-04-04 IMAGING — XA IR EMBO TUMOR ORGAN ISCHEMIA INFARCT INC GUIDE ROADMAPPING
7 of 8 series · 11 of 24 positions shown · IV contrast (IODINE)
Comparison: none

Addendum:
INDICATION: 79-year-old male with right-sided colon adenocarcinoma metastatic to
the liver. He has multifocal disease in the right hepatic lobe and
presents today for right lobar trans arterial radio embolization
with [AGE].

[Series 1: care body 4 · 2 of 23 frames shown (1 of 6)]
[frame 4/23]
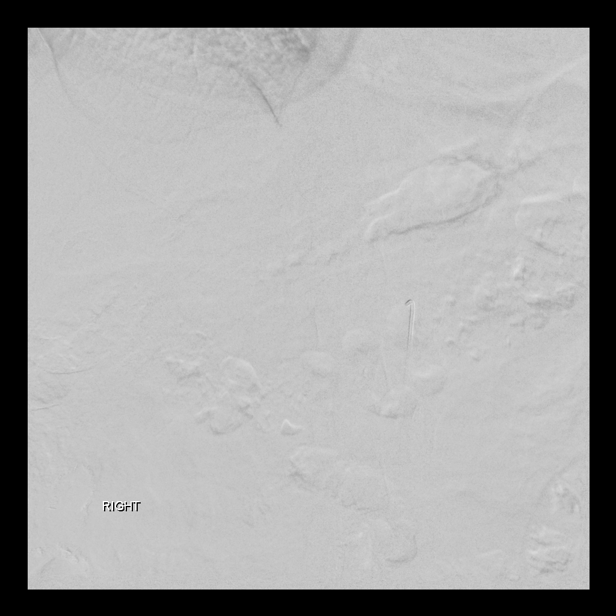
[frame 20/23]
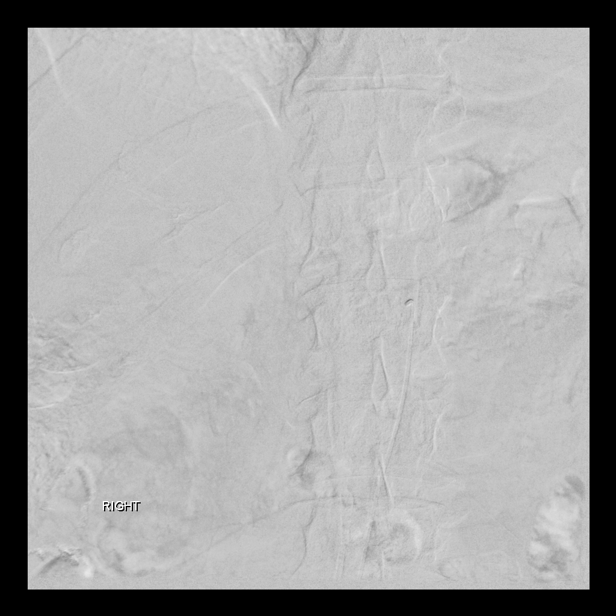

[Series 2: care body 4 · 2 of 20 frames shown (2 of 6)]
[frame 11/20]
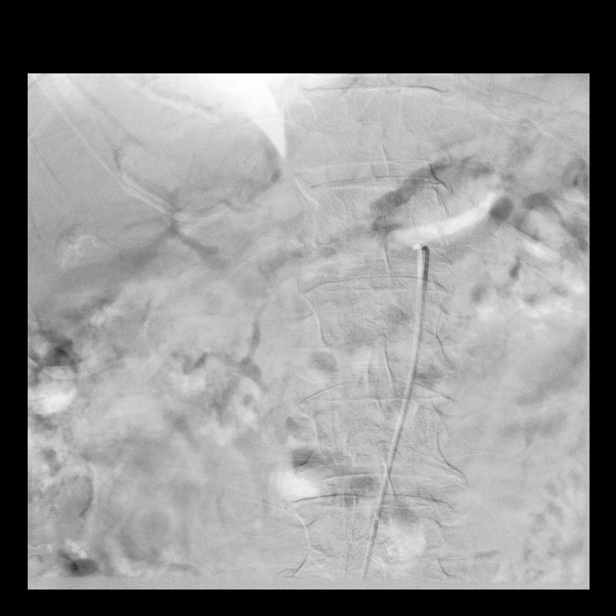
[frame 18/20]
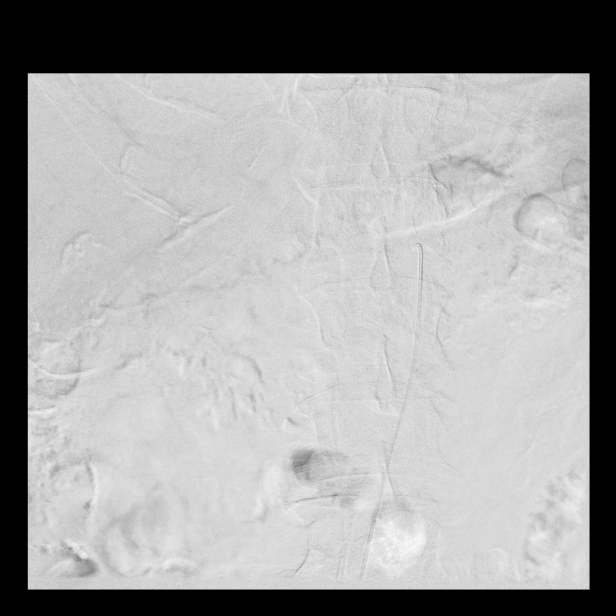

[Series 3: care body 4 · 1 of 24 frames shown (3 of 6)]
[frame 21/24]
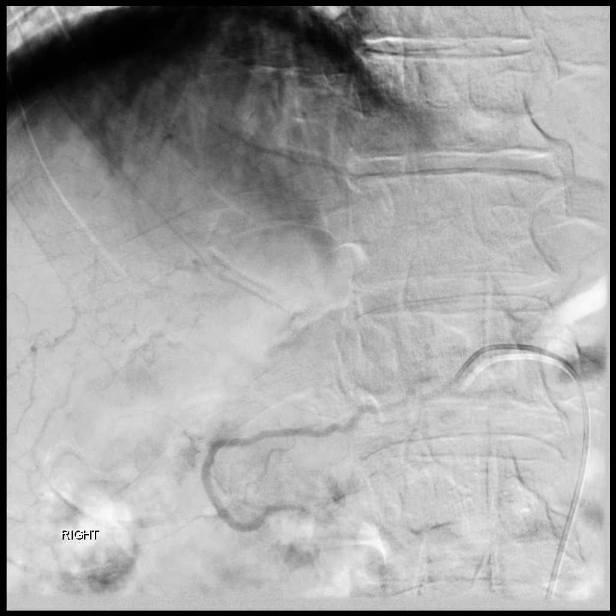

[Series 4: care body 4 · 2 of 59 frames shown (4 of 6)]
[frame 30/59]
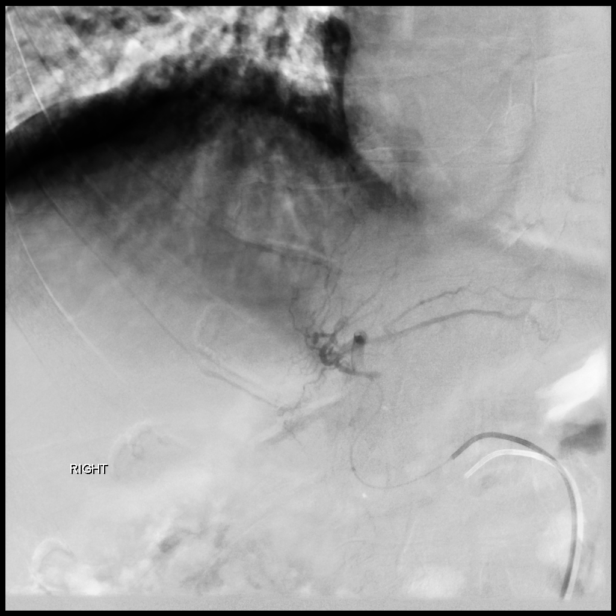
[frame 51/59]
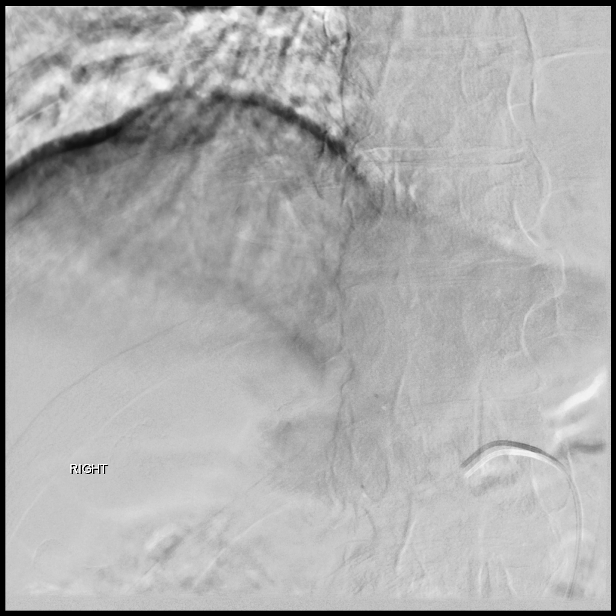

[Series 5: care body 4 · 1 of 51 frames shown (5 of 6)]
[frame 38/51]
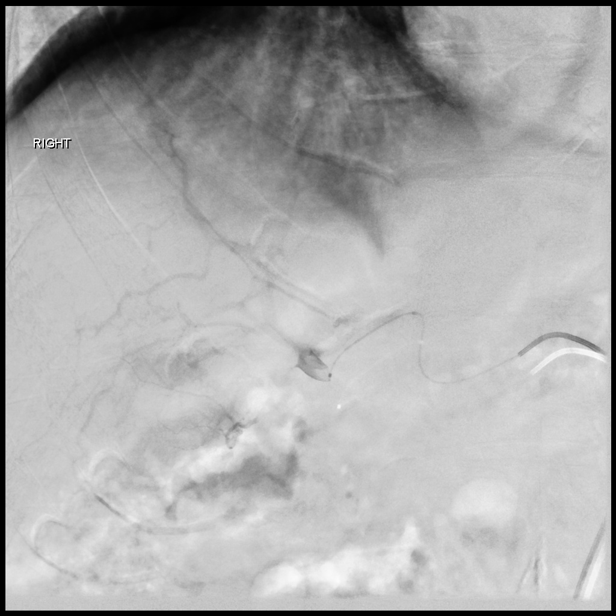

[Series 6: fl - angio · 2 of 44 frames shown]
[frame 7/44]
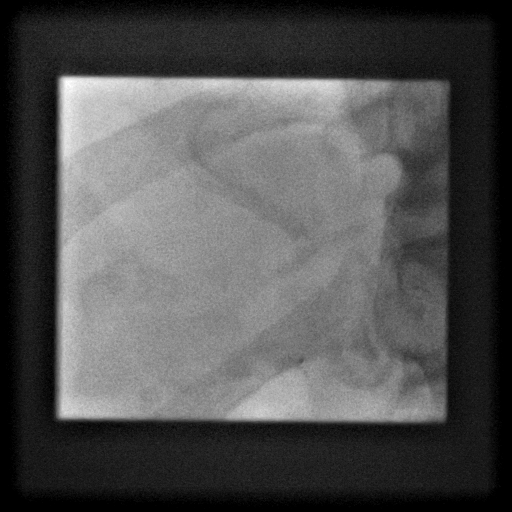
[frame 25/44]
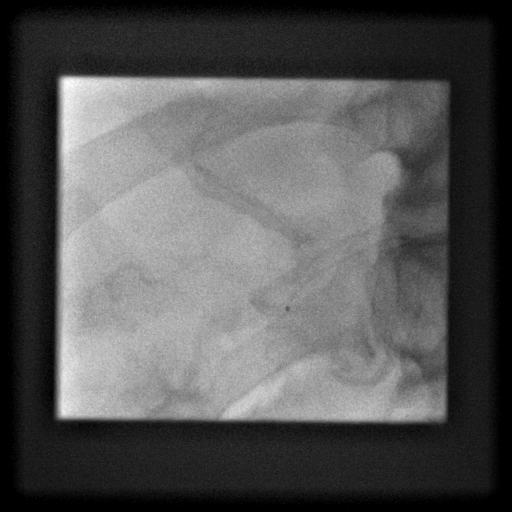

[Series 7: care body 4 · 1 of 3 frames shown (6 of 6)]
[frame 2/3]
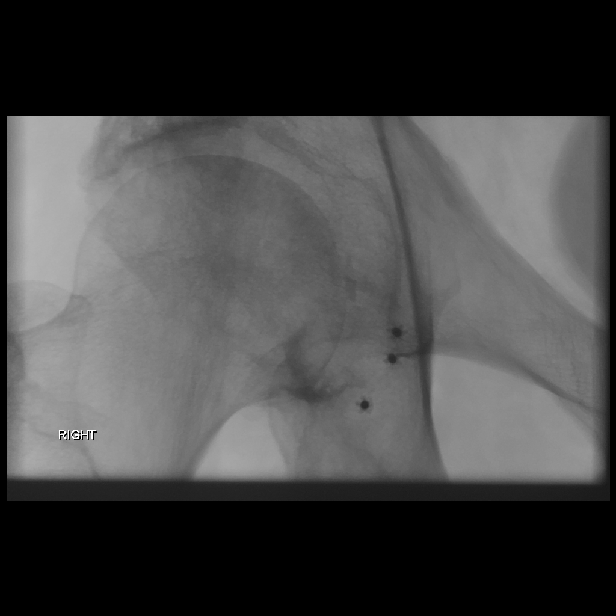

[11 of 24 positions shown; findings below may reference images not displayed]

EXAM:
IR EMBO TUMOR ORGAN ISCHEMIA INFARCT INC GUIDE ROADMAPPING;
SELECTIVE VISCERAL ARTERIOGRAPHY; ADDITIONAL ARTERIOGRAPHY; IR
ULTRASOUND GUIDANCE VASC ACCESS RIGHT

MEDICATIONS:
3.375 g Zosyn; 12.5 mg Benadryl. The antibiotic was administered
within 1 hour of the procedure

ANESTHESIA/SEDATION:
Moderate (conscious) sedation was employed during this procedure. A
total of Versed 4 mg and Fentanyl 100 mcg was administered
intravenously.

Moderate Sedation Time: 52 minutes. The patient's level of
consciousness and vital signs were monitored continuously by
radiology nursing throughout the procedure under my direct
supervision.

CONTRAST:  50 mL Omnipaque 300

FLUOROSCOPY TIME:  Fluoroscopy Time: 5 minutes 12 seconds (511 mGy).

COMPLICATIONS:
None immediate.



The right common femoral artery was interrogated with ultrasound and
found to be widely patent. An image was obtained and stored for the
medical record. Local anesthesia was attained by infiltration with
1% lidocaine. A small dermatotomy was made. Under real-time
sonographic guidance, the vessel was punctured with a 21 gauge
micropuncture needle. Using standard technique, the initial micro
needle was exchanged over a 0.018 micro wire for a transitional 4
French micro sheath. The micro sheath was then exchanged over a
0.035 wire for a 5 French vascular sheath.

A C2 cobra catheter was advanced over a Bentson wire into the
abdominal aorta and used to select the superior mesenteric artery. A
superior mesenteric arteriogram was performed. No evidence of
accessory or replaced right hepatic artery. The portal vein remains
patent.

The C2 cobra catheter was next advanced into the celiac artery. A
celiac arteriogram was performed to provide a map for
catheterization of the common hepatic artery. The C2 catheter was
then advanced over a Glidewire into the common hepatic artery.
Arteriography was then performed in multiple obliquities. The
origins of the left hepatic artery, segment [DATE] hepatic artery and
main right hepatic artery were identified.

A 2.5 MEMIN Cantata microcatheter was advanced over a Fathom
16 wire in used to select the left hepatic artery. Arteriography was
performed. No definite evidence of tumoral blush.

The microcatheter was next advanced beyond the segment [DATE] branch
and into the dominant right hepatic artery. Contrast injection was
performed confirming on precise location of the microcatheter tip as
well as multifocal tumoral blush consistent with known multifocal
hepatic metastatic disease. The microcatheter was secured in place.

Trans arterial radio embolization was then performed using our
standard protocol. Resin [AGE] labeled microspheres were injected in
aliquots. Antegrade flow was assessed multiple times throughout the
duration of the injection confirming persistent antegrade flow and
absence of reflux. Once the entire [AGE] dose was administered, the
microcatheter was brought back into the 5 French base catheter and
the catheter system removed as a unit and appropriately disposed of.

Hemostasis was then attained with the assistance of a Celt arterial
closure device.
IMPRESSION: Successful right lobar trans arterial radio embolization with [AGE]
sparing hepatic segments 5 and 7.

*** End of Addendum ***
EXAM:
IR EMBO TUMOR ORGAN ISCHEMIA INFARCT INC GUIDE ROADMAPPING;
SELECTIVE VISCERAL ARTERIOGRAPHY; ADDITIONAL ARTERIOGRAPHY; IR
ULTRASOUND GUIDANCE VASC ACCESS RIGHT

MEDICATIONS:
3.375 g Zosyn; 12.5 mg Benadryl. The antibiotic was administered
within 1 hour of the procedure

ANESTHESIA/SEDATION:
Moderate (conscious) sedation was employed during this procedure. A
total of Versed 4 mg and Fentanyl 100 mcg was administered
intravenously.

Moderate Sedation Time: 52 minutes. The patient's level of
consciousness and vital signs were monitored continuously by
radiology nursing throughout the procedure under my direct
supervision.

CONTRAST:  50 mL Omnipaque 300

FLUOROSCOPY TIME:  Fluoroscopy Time: 5 minutes 12 seconds (511 mGy).

COMPLICATIONS:
None immediate.



The right common femoral artery was interrogated with ultrasound and
found to be widely patent. An image was obtained and stored for the
medical record. Local anesthesia was attained by infiltration with
1% lidocaine. A small dermatotomy was made. Under real-time
sonographic guidance, the vessel was punctured with a 21 gauge
micropuncture needle. Using standard technique, the initial micro
needle was exchanged over a 0.018 micro wire for a transitional 4
French micro sheath. The micro sheath was then exchanged over a
0.035 wire for a 5 French vascular sheath.

A C2 cobra catheter was advanced over a Bentson wire into the
abdominal aorta and used to select the superior mesenteric artery. A
superior mesenteric arteriogram was performed. No evidence of
accessory or replaced right hepatic artery. The portal vein remains
patent.

The C2 cobra catheter was next advanced into the celiac artery. A
celiac arteriogram was performed to provide a map for
catheterization of the common hepatic artery. The C2 catheter was
then advanced over a Glidewire into the common hepatic artery.
Arteriography was then performed in multiple obliquities. The
origins of the left hepatic artery, segment [DATE] hepatic artery and
main right hepatic artery were identified.

A 2.5 MEMIN Cantata microcatheter was advanced over a Fathom
16 wire in used to select the left hepatic artery. Arteriography was
performed. No definite evidence of tumoral blush.

The microcatheter was next advanced beyond the segment [DATE] branch
and into the dominant right hepatic artery. Contrast injection was
performed confirming on precise location of the microcatheter tip as
well as multifocal tumoral blush consistent with known multifocal
hepatic metastatic disease. The microcatheter was secured in place.

Trans arterial radio embolization was then performed using our
standard protocol. Resin [AGE] labeled microspheres were injected in
aliquots. Antegrade flow was assessed multiple times throughout the
duration of the injection confirming persistent antegrade flow and
absence of reflux. Once the entire [AGE] dose was administered, the
microcatheter was brought back into the 5 French base catheter and
the catheter system removed as a unit and appropriately disposed of.

Hemostasis was then attained with the assistance of a Celt arterial
closure device.
IMPRESSION: Successful right lobar trans arterial radio embolization with [AGE]
sparing hepatic segments 5 and 7.

## 2021-04-04 IMAGING — NM NM RADIO PHARM THERAPY INTRA ARTERIAL
3 series · 18 of 18 positions shown · non-contrast
Comparison: [DATE]

CLINICAL DATA: History of colon cancer with unresectable liver
metastasis.

EXAM:
NUCLEAR MEDICINE SPECIAL MED RAD PHYSICS CONS; NUCLEAR MEDICINE
RADIO PHARM THERAPY INTRA ARTERIAL; NUCLEAR MEDICINE TREATMENT
PROCEDURE; NUCLEAR MEDICINE LIVER SCAN
TECHNIQUE: In conjunction with the interventional radiologist a Y- Microsphere
dose was calculated utilizing body surface area formulation.
Calculated dose equal 35.1 mCi. Pre therapy MAA liver SPECT scan and
CTA were evaluated. Utilizing a microcatheter system, the hepatic
artery was selected and Y-90 microspheres were delivered in
fractionated aliquots. Radiopharmaceutical was delivered by the
interventional radiologist and nuclear radiologist.
The patient tolerated procedure well. No adverse effects were noted.
Bremsstrahlung planar and SPECT imaging of the abdomen following
intrahepatic arterial delivery of Y-90 microsphere was performed.
RADIOPHARMACEUTICALS:  36.2 MCI Y-90 MICROSPHERES INSTILLED VIA
INTRAHEPATIC ARTERY CATHETER

[Series 1: spect - (id) _(id)_cor · 4.1mm · 4.14mm/px · 6 of 128 frames shown]
[frame 11/128]
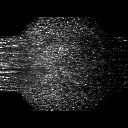
[frame 32/128]
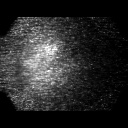
[frame 54/128]
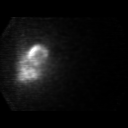
[frame 75/128]
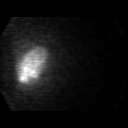
[frame 96/128]
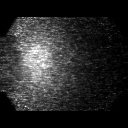
[frame 118/128]
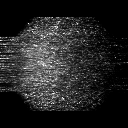

[Series 1: spect - (id) _(id)_tra · 4.1mm · 4.14mm/px · 6 of 128 frames shown]
[frame 11/128]
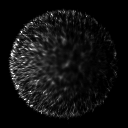
[frame 32/128]
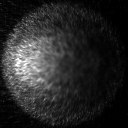
[frame 54/128]
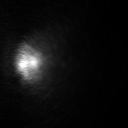
[frame 75/128]
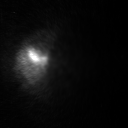
[frame 96/128]
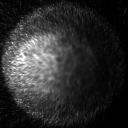
[frame 118/128]
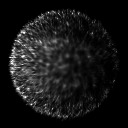

[Series 2: y-90 spect · 4.14mm/px · 6 of 64 frames shown]
[frame 6/64]
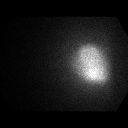
[frame 16/64]
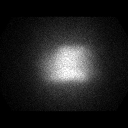
[frame 27/64]
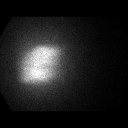
[frame 38/64]
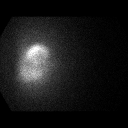
[frame 48/64]
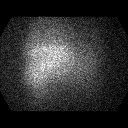
[frame 59/64]
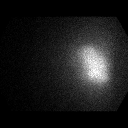

[18 of 18 positions shown; findings below may reference images not displayed]

FINDINGS: [AGE] microspheres therapy as above. First therapy the right
hepatic lobe.

Bremsstrahlung planar and SPECT imaging of the abdomen following
intrahepatic arterial delivery of [0C] demonstrates
radioactivity localized to the right hepatic lobe. No evidence of
extrahepatic activity.
IMPRESSION: Successful [AGE] microsphere delivery for treatment of unresectable
liver metastasis. First therapy to the right lobe.

Bremssstrahlung scan demonstrates activity localized to right
hepatic lobe with no extrahepatic activity identified.

## 2021-04-04 MED ORDER — LIDOCAINE HCL 1 % IJ SOLN
INTRAMUSCULAR | Status: AC | PRN
  Administered 2021-04-04: 10 mL via INTRADERMAL

## 2021-04-04 MED ORDER — ONDANSETRON HCL 4 MG/2ML IJ SOLN
4.0000 mg | Freq: Once | INTRAMUSCULAR | Status: AC
  Administered 2021-04-04: 4 mg via INTRAVENOUS
  Filled 2021-04-04: qty 2

## 2021-04-04 MED ORDER — PANTOPRAZOLE SODIUM 40 MG IV SOLR
40.0000 mg | Freq: Once | INTRAVENOUS | Status: AC
  Administered 2021-04-04: 40 mg via INTRAVENOUS
  Filled 2021-04-04: qty 40

## 2021-04-04 MED ORDER — FENTANYL CITRATE (PF) 100 MCG/2ML IJ SOLN
INTRAMUSCULAR | Status: AC | PRN
  Administered 2021-04-04 (×2): 50 ug via INTRAVENOUS

## 2021-04-04 MED ORDER — MIDAZOLAM HCL 2 MG/2ML IJ SOLN
INTRAMUSCULAR | Status: AC | PRN
  Administered 2021-04-04: 1 mg via INTRAVENOUS
  Administered 2021-04-04 (×4): 0.5 mg via INTRAVENOUS
  Administered 2021-04-04: 1 mg via INTRAVENOUS
  Administered 2021-04-04: 0.5 mg via INTRAVENOUS

## 2021-04-04 MED ORDER — DEXAMETHASONE SODIUM PHOSPHATE 10 MG/ML IJ SOLN
8.0000 mg | Freq: Once | INTRAMUSCULAR | Status: AC
  Administered 2021-04-04: 8 mg via INTRAVENOUS
  Filled 2021-04-04: qty 1

## 2021-04-04 MED ORDER — PIPERACILLIN-TAZOBACTAM 3.375 G IVPB 30 MIN
3.3750 g | Freq: Once | INTRAVENOUS | Status: AC
  Administered 2021-04-04: 3.375 g via INTRAVENOUS
  Filled 2021-04-04: qty 50

## 2021-04-04 MED ORDER — YTTRIUM 90 INJECTION
36.2000 | INJECTION | Freq: Once | INTRAVENOUS | Status: AC
  Administered 2021-04-04: 36.2 via INTRA_ARTERIAL

## 2021-04-04 MED ORDER — HYDRALAZINE HCL 20 MG/ML IJ SOLN
INTRAMUSCULAR | Status: AC
  Filled 2021-04-04: qty 1

## 2021-04-04 MED ORDER — PIPERACILLIN-TAZOBACTAM 3.375 G IVPB
INTRAVENOUS | Status: AC
  Filled 2021-04-04: qty 50

## 2021-04-04 MED ORDER — SODIUM CHLORIDE 0.9 % IV SOLN: INTRAVENOUS | Status: DC

## 2021-04-04 MED ORDER — DIPHENHYDRAMINE HCL 50 MG/ML IJ SOLN
INTRAMUSCULAR | Status: AC | PRN
  Administered 2021-04-04: 12.5 mg via INTRAVENOUS

## 2021-04-04 MED ORDER — DIPHENHYDRAMINE HCL 50 MG/ML IJ SOLN
INTRAMUSCULAR | Status: AC
  Filled 2021-04-04: qty 1

## 2021-04-04 MED ORDER — HEPARIN SOD (PORK) LOCK FLUSH 100 UNIT/ML IV SOLN
500.0000 [IU] | INTRAVENOUS | Status: DC | PRN
  Filled 2021-04-04: qty 5

## 2021-04-04 MED ORDER — FENTANYL CITRATE (PF) 100 MCG/2ML IJ SOLN
INTRAMUSCULAR | Status: AC
  Filled 2021-04-04: qty 2

## 2021-04-04 MED ORDER — HYDRALAZINE HCL 20 MG/ML IJ SOLN
10.0000 mg | INTRAMUSCULAR | Status: DC | PRN
  Administered 2021-04-04: 10 mg via INTRAVENOUS

## 2021-04-04 MED ORDER — LIDOCAINE HCL 1 % IJ SOLN
INTRAMUSCULAR | Status: AC
  Filled 2021-04-04: qty 20

## 2021-04-04 MED ORDER — MIDAZOLAM HCL 2 MG/2ML IJ SOLN
INTRAMUSCULAR | Status: AC
  Filled 2021-04-04: qty 4

## 2021-04-04 NOTE — Progress Notes (Signed)
Patient BP decreased to 168/92.  Monte Fantasia, PA notified who states ok to discharge patient.  Patient asymptomatic.  Patient and family educated on risks of associated with elevated blood pressure as well as encouraged to notify PCP today.  Patient and Carolann (dtr) verbalized understanding.

## 2021-04-04 NOTE — Procedures (Signed)
Interventional Radiology Procedure Note  Procedure: Right lobar TARE with Y90  Complications: None  Estimated Blood Loss: None  Recommendations: - Bedrest x 1 hr - DC home   Signed,  Sherral Dirocco K. Marleny Faller, MD   

## 2021-04-04 NOTE — Discharge Instructions (Addendum)
PLEASE FOLLOW UP WITH PCP TODAY CONCERNING ELEVATED BLOOD PRESSURE  Interventional radiology phone numbers [phone] After hours [phone]   Post Y-90 Radioembolization Discharge Instructions  You have been given a radioactive material during your procedure.  While it is safe for you to be discharged home from the hospital, you need to proceed directly home.    Do not use public transportation, including air travel, lasting more than 2 hours for 1 week.  Avoid crowded public places for 1 week.  Adult visitors should try to avoid close contact with you for 1 week.    Children and pregnant females should not visit or have close contact with you for 1 week.  Items that you touch are not radioactive.  Do not sleep in the same bed as your partner for 1 week, and a condom should be used for sexual activity during the first 24 hours.  Your blood may be radioactive and caution should be used if any bleeding occurs during the recovery period.  Body fluids may be radioactive for 24 hours.  Wash your hands after voiding.  Men should sit to urinate.  Dispose of any soiled materials (flush down toilet or place in trash at home) during the first day.  Drink 6 to 8 glasses of fluids per day for 5 days to hydrate yourself.  If you need to see a doctor during the first week, you must let them know that you were treated with yttrium-90 microspheres, and will be slightly radioactive.  They can call Interventional Radiology [phone] with any questions.

## 2021-04-04 NOTE — Progress Notes (Signed)
Patient BP elevated 200/80 post procedure.  Monte Fantasia, PA notified.  Orders given for Hydralazine 10mg  IV and recheck BP in 20  minutes

## 2021-04-13 ENCOUNTER — Encounter: Payer: Self-pay | Admitting: Internal Medicine

## 2021-04-23 ENCOUNTER — Other Ambulatory Visit: Payer: Self-pay | Admitting: Interventional Radiology

## 2021-04-23 DIAGNOSIS — C787 Secondary malignant neoplasm of liver and intrahepatic bile duct: Secondary | ICD-10-CM

## 2021-04-28 ENCOUNTER — Inpatient Hospital Stay: Payer: Medicare HMO | Admitting: Internal Medicine

## 2021-04-28 ENCOUNTER — Inpatient Hospital Stay: Payer: Medicare HMO

## 2021-04-28 ENCOUNTER — Other Ambulatory Visit: Payer: Self-pay

## 2021-04-28 ENCOUNTER — Telehealth: Payer: Self-pay | Admitting: Pharmacy Technician

## 2021-04-28 ENCOUNTER — Encounter: Payer: Self-pay | Admitting: Internal Medicine

## 2021-04-28 ENCOUNTER — Inpatient Hospital Stay: Payer: Medicare HMO | Attending: Hospice and Palliative Medicine | Admitting: Hospice and Palliative Medicine

## 2021-04-28 DIAGNOSIS — C182 Malignant neoplasm of ascending colon: Secondary | ICD-10-CM | POA: Diagnosis not present

## 2021-04-28 DIAGNOSIS — G893 Neoplasm related pain (acute) (chronic): Secondary | ICD-10-CM

## 2021-04-28 DIAGNOSIS — Z79891 Long term (current) use of opiate analgesic: Secondary | ICD-10-CM

## 2021-04-28 DIAGNOSIS — C187 Malignant neoplasm of sigmoid colon: Secondary | ICD-10-CM | POA: Diagnosis present

## 2021-04-28 DIAGNOSIS — Z95828 Presence of other vascular implants and grafts: Secondary | ICD-10-CM

## 2021-04-28 DIAGNOSIS — C787 Secondary malignant neoplasm of liver and intrahepatic bile duct: Secondary | ICD-10-CM | POA: Insufficient documentation

## 2021-04-28 DIAGNOSIS — Z5112 Encounter for antineoplastic immunotherapy: Secondary | ICD-10-CM | POA: Diagnosis not present

## 2021-04-28 DIAGNOSIS — Z515 Encounter for palliative care: Secondary | ICD-10-CM | POA: Diagnosis not present

## 2021-04-28 LAB — COMPREHENSIVE METABOLIC PANEL
ALT: 22 U/L (ref 0–44)
AST: 36 U/L (ref 15–41)
Albumin: 3 g/dL — ABNORMAL LOW (ref 3.5–5.0)
Alkaline Phosphatase: 232 U/L — ABNORMAL HIGH (ref 38–126)
Anion gap: 7 (ref 5–15)
BUN: 12 mg/dL (ref 8–23)
CO2: 24 mmol/L (ref 22–32)
Calcium: 8.6 mg/dL — ABNORMAL LOW (ref 8.9–10.3)
Chloride: 107 mmol/L (ref 98–111)
Creatinine, Ser: 0.83 mg/dL (ref 0.61–1.24)
GFR, Estimated: >60 mL/min (ref >60)
Glucose, Bld: 207 mg/dL — ABNORMAL HIGH (ref 70–99)
Potassium: 3.8 mmol/L (ref 3.5–5.1)
Sodium: 138 mmol/L (ref 135–145)
Total Bilirubin: 0.9 mg/dL (ref 0.3–1.2)
Total Protein: 6.8 g/dL (ref 6.5–8.1)

## 2021-04-28 LAB — CBC WITH DIFFERENTIAL/PLATELET
Abs Immature Granulocytes: 0.02 10*3/uL (ref 0.00–0.07)
Basophils Absolute: 0.1 10*3/uL (ref 0.0–0.1)
Basophils Relative: 1 %
Eosinophils Absolute: 0.3 10*3/uL (ref 0.0–0.5)
Eosinophils Relative: 5 %
HCT: 34.4 % — ABNORMAL LOW (ref 39.0–52.0)
Hemoglobin: 11.5 g/dL — ABNORMAL LOW (ref 13.0–17.0)
Immature Granulocytes: 0 %
Lymphocytes Relative: 13 %
Lymphs Abs: 0.7 10*3/uL (ref 0.7–4.0)
MCH: 32.9 pg (ref 26.0–34.0)
MCHC: 33.4 g/dL (ref 30.0–36.0)
MCV: 98.3 fL (ref 80.0–100.0)
Monocytes Absolute: 0.5 10*3/uL (ref 0.1–1.0)
Monocytes Relative: 10 %
Neutro Abs: 3.6 10*3/uL (ref 1.7–7.7)
Neutrophils Relative %: 71 %
Platelets: 146 10*3/uL — ABNORMAL LOW (ref 150–400)
RBC: 3.5 MIL/uL — ABNORMAL LOW (ref 4.22–5.81)
RDW: 14 % (ref 11.5–15.5)
WBC: 5.1 10*3/uL (ref 4.0–10.5)
nRBC: 0 % (ref 0.0–0.2)

## 2021-04-28 MED ORDER — OXYCODONE HCL 5 MG PO TABS: 5.0000 mg | ORAL_TABLET | Freq: Four times a day (QID) | ORAL | 0 refills | Status: DC | PRN

## 2021-04-28 MED ORDER — HEPARIN SOD (PORK) LOCK FLUSH 100 UNIT/ML IV SOLN
INTRAVENOUS | Status: AC
  Filled 2021-04-28: qty 5

## 2021-04-28 MED ORDER — SODIUM CHLORIDE 0.9% FLUSH
10.0000 mL | Freq: Once | INTRAVENOUS | Status: AC
  Administered 2021-04-28: 10 mL via INTRAVENOUS
  Filled 2021-04-28: qty 10

## 2021-04-28 MED ORDER — HEPARIN SOD (PORK) LOCK FLUSH 100 UNIT/ML IV SOLN
500.0000 [IU] | Freq: Once | INTRAVENOUS | Status: AC
  Administered 2021-04-28: 500 [IU] via INTRAVENOUS
  Filled 2021-04-28: qty 5

## 2021-04-28 NOTE — Progress Notes (Signed)
Virtual Visit via Telephone Note  I connected with Oscar [name] on 04/28/21 at  9:30 AM EDT by telephone and verified that I am speaking with the correct person using two identifiers.  Location: Patient: Home Provider: Clinic   I discussed the limitations, risks, security and privacy concerns of performing an evaluation and management service by telephone and the availability of in person appointments. I also discussed with the patient that there may be a patient responsible charge related to this service. The patient expressed understanding and agreed to proceed.   History of Present Illness: [name] is a 80 y.o. male with multiple medical problems including stage IV colon cancer s/p FOLFIRI plus Avastin chemotherapy now on surveillance.  Patient was referred to palliative care to help address goals and manage ongoing symptoms.   Observations/Objective: Patient underwent embolization to liver lesions on 01/15/2021.  He is status post Y 90 to liver lesions on 04/04/2021.  Patient reports that he is doing well.  He denies any significant changes or concerns.  He continues to have occasional abdominal pain for which he takes oxycodone as needed.  He did request refill of oxycodone today and says it is controlling his pain well.  Assessment and Plan: Metastatic colon cancer -status post liver embolization/Y 90.  Symptomatically appears to be doing well.  He has follow-up i later today with Dr. Rogue Bussing.  Neoplasm related pain -refill oxycodone  Follow Up Instructions: Follow-up MyChart visit 2 months   I discussed the assessment and treatment plan with the patient. The patient was provided an opportunity to ask questions and all were answered. The patient agreed with the plan and demonstrated an understanding of the instructions.   The patient was advised to call back or seek an in-person evaluation if the symptoms worsen or if the condition fails to improve as  anticipated.  I provided 5 minutes of non-face-to-face time during this encounter.   [name], NP

## 2021-04-28 NOTE — Assessment & Plan Note (Addendum)
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  Most recently s/p  FOLFIRI plus Avastin on HOLD-given local therapies to liver. s/p Ablation- MAY 2022 MRI- Multiple lvier lesions/progression-patient underwent s/p Y 90 on 06/17.  Awaiting reevaluation with IR tomorrow.  #In general given the progressive systemic disease-I would recommend initiation of oral TAS-102 (35 mg/m2 twice daily on days 1-5 and 8-12 every 28 days)  combined with intravenous zirabev/bevacizumab (5 mg/kg on days 1 and 15) until progression.  Addition of progression of bevacizumab has increased PFS compared to monotherapy.  Patient understand treatments are palliative not curative.  # PN- G-1-2; sec to oxaliplatin-STABLE.   # Mediport- port flush q 2-3 months  # Shoulder pain-refill oxycodone Silver.[place] ]; STABLE.  # DISPOSITION:  # follow up in 2weeks-- MD; labs- cbc/cmp CEA/UA; zirabev-Dr.B .

## 2021-04-28 NOTE — Progress Notes (Signed)
[place] NOTE  Patient Care Team: Sofie Hartigan, MD as PCP - General (Family Medicine) Cammie Sickle, MD as Consulting Physician (Hematology and Oncology)  CHIEF COMPLAINTS/PURPOSE OF CONSULTATION: Colon cancer  #  Oncology History Overview Note  # MAY 2020- 3. 03/09/19 Liver biopsy. Microscopic examination shows malignant cells with glandular architecture consistent with adenocarcinoma. The malignant cells are positive for CK20 and CDX-2. These findings support the clinical impression of metastatic colon adenocarcinoma. 4. 03/10/19 R hemicolectomy. Tumor site cecum. Adenocarcinoma. Mucinous features present. G2. No tumor deposits. Invades visceral peritoneum. No tumor perforation. LVI present. PNI not identified. All margins uninvolved. 1/12 LNs. PT4apN1. Periappendiceal inflammation c/w resolving abscess. Microsatellite stable (MSS). [Dr.Mettu; [facility]]  # SEP 4th 2020 [compared to May 2020]  Interval increase in size of the metastases to the hepatic dome, The metastasis to the left hepatic lobe is unchanged; 2.  New subcentimeter hypoattenuating lesion in the inferior right hepatic lobe, incompletely characterized on CT. 3.  Postsurgical changes following right hemicolectomy.  # OCT 2020- FOLFOX +avastin; CT dec 22nd 2020- [compared to [facility] sep 9th 2020]-Liver- slight progression versus stable disease; CT scan SEP 4th 2021- Progressive disease-left lower lobe lung nodule 8 mm [previously 4 mm]; increase in size of the hepatic metastases by few millimeters.;  Soft tissue nodule adjacent to anastomotic site again increased by few millimeters.STOP FOLFOX; cont avastin  #  SEP 20th, 2021- FOLFIRI+ AVASTIN; HOLD FEB 2022- sec to poor toelrance [peptic ulcer]; 3/30-liver ablation-   [bland embolization of segment 2/3 liver lesion on 12/19/2020;  microwave ablation of both liver lesions.  # FEB /16-EGD [Dr.Anna-duodenal ulcer-? meloxicam]  # JAN 2022- COVID [s/p Mab  infusion; pills; skin rash resolved.]  # NGS/F-ONE-MUTATED K-RAS [G]  # PALLIATIVE CARE EVALUATION: 09/20/2019-[name]  # PAIN MANAGEMENT: NA   DIAGNOSIS: COLON CANCER  STAGE:  IV     ;  GOALS:Palliative  CURRENT/MOST RECENT THERAPY : FOLFIRI+ avastin [C]    Cancer of right colon ([place])  07/05/2019 Initial Diagnosis   Cancer of right colon ([place])   07/24/2019 - 06/25/2020 Chemotherapy   The patient had dexamethasone (DECADRON) 4 MG tablet, 8 mg, Oral, Daily, 1 of 1 cycle, Start date: --, End date: -- palonosetron (ALOXI) injection 0.25 mg, 0.25 mg, Intravenous,  Once, 23 of 25 cycles Administration: 0.25 mg (07/24/2019), 0.25 mg (08/07/2019), 0.25 mg (08/21/2019), 0.25 mg (09/04/2019), 0.25 mg (09/20/2019), 0.25 mg (10/04/2019), 0.25 mg (10/16/2019), 0.25 mg (10/30/2019), 0.25 mg (11/22/2019), 0.25 mg (12/06/2019), 0.25 mg (12/20/2019), 0.25 mg (01/03/2020), 0.25 mg (01/29/2020), 0.25 mg (02/12/2020), 0.25 mg (02/26/2020), 0.25 mg (03/11/2020), 0.25 mg (03/25/2020), 0.25 mg (04/08/2020), 0.25 mg (04/24/2020), 0.25 mg (05/08/2020), 0.25 mg (05/22/2020), 0.25 mg (06/10/2020), 0.25 mg (06/25/2020) leucovorin 800 mg in dextrose 5 % 250 mL infusion, 844 mg, Intravenous,  Once, 23 of 25 cycles Administration: 800 mg (07/24/2019), 800 mg (08/07/2019), 800 mg (08/21/2019), 800 mg (09/04/2019), 800 mg (09/20/2019), 800 mg (10/04/2019), 800 mg (10/16/2019), 800 mg (10/30/2019), 800 mg (11/22/2019), 800 mg (12/06/2019), 800 mg (12/20/2019), 800 mg (01/03/2020), 800 mg (01/29/2020), 800 mg (02/12/2020), 800 mg (02/26/2020), 800 mg (03/11/2020), 800 mg (03/25/2020), 800 mg (04/08/2020), 800 mg (04/24/2020), 800 mg (05/08/2020), 800 mg (05/22/2020), 800 mg (06/10/2020), 800 mg (06/25/2020) oxaliplatin (ELOXATIN) 180 mg in dextrose 5 % 500 mL chemo infusion, 85 mg/m2 = 180 mg, Intravenous,  Once, 23 of 25 cycles Dose modification: 178 mg (original dose 85 mg/m2, Cycle 17, Reason: Other (see comments), Comment: insurance adjusted  dose ) Administration: 180 mg  (07/24/2019), 180 mg (08/07/2019), 180 mg (08/21/2019), 180 mg (09/04/2019), 180 mg (09/20/2019), 180 mg (10/04/2019), 180 mg (10/16/2019), 180 mg (10/30/2019), 180 mg (11/22/2019), 180 mg (12/06/2019), 180 mg (12/20/2019), 180 mg (01/03/2020), 180 mg (01/29/2020), 180 mg (02/12/2020), 180 mg (02/26/2020), 180 mg (03/11/2020), 180 mg (04/08/2020), 180 mg (04/24/2020), 180 mg (05/08/2020), 180 mg (05/22/2020), 180 mg (06/10/2020), 180 mg (06/25/2020) fluorouracil (ADRUCIL) 5,000 mg in sodium chloride 0.9 % 150 mL chemo infusion, 5,050 mg, Intravenous, 1 Day/Dose, 23 of 25 cycles Administration: 5,000 mg (07/24/2019), 5,000 mg (08/07/2019), 5,000 mg (08/21/2019), 5,050 mg (09/04/2019), 5,000 mg (10/04/2019), 5,000 mg (10/16/2019), 5,000 mg (11/22/2019), 5,000 mg (12/06/2019), 5,000 mg (12/20/2019), 5,000 mg (01/03/2020), 5,000 mg (01/29/2020), 5,000 mg (02/12/2020), 5,000 mg (02/26/2020), 5,000 mg (03/11/2020), 5,000 mg (03/25/2020), 5,000 mg (04/08/2020), 5,000 mg (04/24/2020), 5,000 mg (05/08/2020), 5,000 mg (05/22/2020), 5,000 mg (06/10/2020), 5,000 mg (06/25/2020) bevacizumab-bvzr (ZIRABEV) 400 mg in sodium chloride 0.9 % 100 mL chemo infusion, 5 mg/kg = 400 mg, Intravenous,  Once, 23 of 25 cycles Administration: 400 mg (08/07/2019), 400 mg (08/21/2019), 400 mg (09/04/2019), 400 mg (09/20/2019), 400 mg (10/04/2019), 400 mg (10/16/2019), 400 mg (10/30/2019), 400 mg (11/22/2019), 400 mg (12/06/2019), 400 mg (12/20/2019), 400 mg (01/03/2020), 400 mg (01/29/2020), 400 mg (02/12/2020), 400 mg (02/26/2020), 400 mg (03/11/2020), 400 mg (03/25/2020), 400 mg (04/08/2020), 400 mg (04/24/2020), 400 mg (05/08/2020), 400 mg (05/22/2020), 400 mg (06/10/2020), 400 mg (06/25/2020)   for chemotherapy treatment.     07/08/2020 -  Chemotherapy    Patient is on Treatment Plan: COLORECTAL FOLFIRI / BEVACIZUMAB Q14D       11/05/2020 Cancer Staging   Staging form: Colon and Rectum, AJCC 8th Edition - Clinical: Stage IVC (pM1c) - Signed by Cammie Sickle, MD on 11/05/2020       HISTORY OF PRESENTING ILLNESS:  Oscar [name] 80 y.o.  male with metastatic colon cancer to the liver most recently noted to have progressive disease in the liver on MRI ; currently s/p Y 90 treatment-last treatment on June 17.  Patient denies any worsening abdominal pain nausea vomiting.  No fever chills.  Continues her chronic shoulder pain not any worse.   Review of Systems  Constitutional:  Positive for malaise/fatigue. Negative for chills, diaphoresis and fever.  HENT:  Negative for nosebleeds and sore throat.   Eyes:  Negative for double vision.  Respiratory:  Negative for cough, hemoptysis, sputum production, shortness of breath and wheezing.   Cardiovascular:  Negative for chest pain, palpitations, orthopnea and leg swelling.  Gastrointestinal:  Negative for blood in stool, constipation, heartburn, melena, nausea and vomiting.  Genitourinary:  Negative for dysuria, frequency and urgency.  Musculoskeletal:  Positive for back pain and joint pain.  Skin: Negative.  Negative for itching and rash.  Neurological:  Positive for tingling. Negative for focal weakness, weakness and headaches.  Endo/Heme/Allergies:  Does not bruise/bleed easily.  Psychiatric/Behavioral:  Negative for depression. The patient is not nervous/anxious and does not have insomnia.     MEDICAL HISTORY:  Past Medical History:  Diagnosis Date  . Arthritis   . Cancer ([place])    colon cancer 02/2019 per pt   . Family history of adverse reaction to anesthesia    cousin took all night to wake up from anesthesia  . H/O colon cancer, stage IV   . Hyperlipemia   . Neuromuscular disorder ([place])   . Pre-diabetes     SURGICAL HISTORY: Past Surgical History:  Procedure Laterality Date  .  COLON SURGERY    . ESOPHAGOGASTRODUODENOSCOPY (EGD) WITH PROPOFOL N/A 12/26/2020   Procedure: ESOPHAGOGASTRODUODENOSCOPY (EGD) WITH PROPOFOL;  Surgeon: [name], MD;  Location[facility] END[facility]COPY;  Service: Gastroenterology;   Laterality: N/A;  . IR ANGIOGRAM SELECTIVE EACH ADDITIONAL VESSEL  12/19/2020  . IR ANGIOGRAM SELECTIVE EACH ADDITIONAL VESSEL  03/24/2021  . IR ANGIOGRAM SELECTIVE EACH ADDITIONAL VESSEL  03/24/2021  . IR ANGIOGRAM SELECTIVE EACH ADDITIONAL VESSEL  04/04/2021  . IR ANGIOGRAM SELECTIVE EACH ADDITIONAL VESSEL  04/04/2021  . IR ANGIOGRAM SELECTIVE EACH ADDITIONAL VESSEL  04/04/2021  . IR ANGIOGRAM VISCERAL SELECTIVE  12/19/2020  . IR ANGIOGRAM VISCERAL SELECTIVE  03/24/2021  . IR ANGIOGRAM VISCERAL SELECTIVE  04/04/2021  . IR ANGIOGRAM VISCERAL SELECTIVE  04/04/2021  . IR EMBO ARTERIAL NOT HEMORR HEMANG INC GUIDE ROADMAPPING  03/24/2021  . IR EMBO TUMOR ORGAN ISCHEMIA INFARCT INC GUIDE ROADMAPPING  12/19/2020  . IR EMBO TUMOR ORGAN ISCHEMIA INFARCT INC GUIDE ROADMAPPING  04/04/2021  . IR IMAGING GUIDED PORT INSERTION  07/20/2019  . IR RADIOLOGIST EVAL & MGMT  12/05/2020  . IR RADIOLOGIST EVAL & MGMT  02/11/2021  . IR RADIOLOGIST EVAL & MGMT  03/04/2021  . IR US GUIDE VASC ACCESS RIGHT  12/19/2020  . IR US GUIDE VASC ACCESS RIGHT  03/24/2021  . IR US GUIDE VASC ACCESS RIGHT  04/04/2021  . JOINT REPLACEMENT Left 2010   Knee  . RADIOLOGY WITH ANESTHESIA N/A 01/15/2021   Procedure: CT WITH ANESTHESIA MICROWAVE ABLATION OF LIVER;  Surgeon: Criselda Peaches, MD;  Location: [facility] ORS;  Service: Anesthesiology;  Laterality: N/A;    SOCIAL HISTORY: Social History   Socioeconomic History  . Marital status: Married    Spouse name: Not on file  . Number of children: Not on file  . Years of education: Not on file  . Highest education level: Not on file  Occupational History  . Not on file  Tobacco Use  . Smoking status: Every Day    Packs/day: 0.25    Years: 15.00    Pack years: 3.75    Types: Cigarettes  . Smokeless tobacco: Never  . Tobacco comments:    1 to 2 cigarettes a day occasionally  Vaping Use  . Vaping Use: Never used  Substance and Sexual Activity  . Alcohol use: Yes    Comment: rarely   . Drug  use: No  . Sexual activity: Not on file  Other Topics Concern  . Not on file  Social History Narrative   Recruitment consultant retd; lives in [place]; smoking 3cig/day; [3/4 ppd x started at 9 years]; no alcohol. Son & daughter; wife dementia [waiting for placement].    Social Determinants of Health   Financial Resource Strain: Not on file  Food Insecurity: Not on file  Transportation Needs: Not on file  Physical Activity: Not on file  Stress: Not on file  Social Connections: Not on file  Intimate Partner Violence: Not on file    FAMILY HISTORY: Family History  Problem Relation Age of Onset  . Peptic Ulcer Disease Father     ALLERGIES:  is allergic to ace inhibitors.  MEDICATIONS:  Current Outpatient Medications  Medication Sig Dispense Refill  . oxyCODONE (OXY IR/ROXICODONE) 5 MG immediate release tablet Take 1 tablet (5 mg total) by mouth every 6 (six) hours as needed for severe pain. 60 tablet 0  . pravastatin (PRAVACHOL) 40 MG tablet Take 40 mg by mouth daily.     . sucralfate (CARAFATE) 1  g tablet TAKE 1 TABLET (1 G TOTAL) BY MOUTH 4 (FOUR) TIMES DAILY - WITH MEALS AND AT BEDTIME. 90 tablet 3  . tamsulosin (FLOMAX) 0.4 MG CAPS capsule Take 1 capsule (0.4 mg total) by mouth daily. 30 capsule 11  . ondansetron (ZOFRAN) 4 MG tablet Take 1 tablet (4 mg total) by mouth every 8 (eight) hours as needed for nausea or vomiting. (Patient not taking: No sig reported) 20 tablet 0  . ondansetron (ZOFRAN) 8 MG tablet Take 1 tablet (8 mg total) by mouth every 8 (eight) hours as needed for nausea or vomiting. (Patient not taking: No sig reported) 20 tablet 0  . pantoprazole (PROTONIX) 40 MG tablet Take 1 tablet (40 mg total) by mouth 2 (two) times daily. (Patient not taking: No sig reported) 60 tablet 3  . prochlorperazine (COMPAZINE) 10 MG tablet Take 1 tablet (10 mg total) by mouth every 6 (six) hours as needed for nausea or vomiting. (Patient not taking: No sig reported) 60  tablet 0   No current facility-administered medications for this visit.      Marland Kitchen  PHYSICAL EXAMINATION: ECOG PERFORMANCE STATUS: 0 - Asymptomatic  Vitals:   04/28/21 1025  BP: 126/80  Pulse: 71  Resp: 18  Temp: 98 F (36.7 C)  SpO2: 100%   Filed Weights   04/28/21 1025  Weight: 185 lb 6.4 oz (84.1 kg)    Physical Exam HENT:     Head: Normocephalic and atraumatic.     Mouth/Throat:     Pharynx: No oropharyngeal exudate.     Comments: Whitish patches noted right side.  Eyes:     Pupils: Pupils are equal, round, and reactive to light.  Cardiovascular:     Rate and Rhythm: Normal rate and regular rhythm.  Pulmonary:     Effort: No respiratory distress.     Breath sounds: No wheezing.     Comments: Decreased breath sounds bilaterally.  No wheeze or crackles Abdominal:     General: Bowel sounds are normal. There is no distension.     Palpations: Abdomen is soft. There is no mass.     Tenderness: There is no abdominal tenderness. There is no guarding or rebound.  Musculoskeletal:        General: No tenderness. Normal range of motion.     Cervical back: Normal range of motion and neck supple.  Skin:    General: Skin is warm.  Neurological:     Mental Status: He is alert and oriented to person, place, and time.  Psychiatric:        Mood and Affect: Affect normal.   LABORATORY DATA:  I have reviewed the data as listed Lab Results  Component Value Date   WBC 5.1 04/28/2021   HGB 11.5 (L) 04/28/2021   HCT 34.4 (L) 04/28/2021   MCV 98.3 04/28/2021   PLT 146 (L) 04/28/2021   Recent Labs    06/25/20 0841 07/08/20 0843 07/22/20 0836 08/05/20 0821 04/01/21 1002 04/04/21 0811 04/28/21 1019  NA 138 139 138   < > 135 142 138  K 3.6 3.6 3.6   < > 3.6 3.6 3.8  CL 108 108 107   < > 102 110 107  CO2 $Re'24 25 25   'ZEw$ < > $R'25 26 24  'kO$ GLUCOSE 201* 209* 195*   < > 216* 121* 207*  BUN $Re'10 9 10   'vYG$ < > $R'13 15 12  'ii$ CREATININE 0.80 0.70 0.89   < > 0.88 0.86 0.83  CALCIUM 8.0* 8.0*  8.1*   < > 8.5* 8.9 8.6*  GFRNONAA >60 >60 >60   < > >60 >60 >60  GFRAA >60 >60 >60  --   --   --   --   PROT 6.5 6.5 6.7   < > 6.9 7.0 6.8  ALBUMIN 3.2* 3.1* 3.1*   < > 3.3* 3.6 3.0*  AST 32 31 29   < > 27 28 36  ALT $Re'20 19 19   'KYj$ < > $R'16 16 22  'xH$ ALKPHOS 121 102 108   < > 111 116 232*  BILITOT 0.8 0.7 0.8   < > 0.7 0.8 0.9   < > = values in this interval not displayed.    RADIOGRAPHIC STUDIES: I have personally reviewed the radiological images as listed and agreed with the findings in the report. NM LIVER TUMOR LOC IMFLAM SPECT 1 DAY  Result Date: 04/04/2021 CLINICAL DATA:  History of colon cancer with unresectable liver metastasis. EXAM: NUCLEAR MEDICINE SPECIAL MED RAD PHYSICS CONS; NUCLEAR MEDICINE RADIO PHARM THERAPY INTRA ARTERIAL; NUCLEAR MEDICINE TREATMENT PROCEDURE; NUCLEAR MEDICINE LIVER SCAN TECHNIQUE: In conjunction with the interventional radiologist a Y- Microsphere dose was calculated utilizing body surface area formulation. Calculated dose equal 35.1 mCi. Pre therapy MAA liver SPECT scan and CTA were evaluated. Utilizing a microcatheter system, the hepatic artery was selected and Y-90 microspheres were delivered in fractionated aliquots. Radiopharmaceutical was delivered by the interventional radiologist and nuclear radiologist. The patient tolerated procedure well. No adverse effects were noted. Bremsstrahlung planar and SPECT imaging of the abdomen following intrahepatic arterial delivery of Y-90 microsphere was performed. RADIOPHARMACEUTICALS:  36.2 MCI Y-90 MICROSPHERES INSTILLED VIA INTRAHEPATIC ARTERY CATHETER COMPARISON:  02/27/2021 FINDINGS: Y - 90 microspheres therapy as above. First therapy the right hepatic lobe. Bremsstrahlung planar and SPECT imaging of the abdomen following intrahepatic arterial delivery of Y-53microsphere demonstrates radioactivity localized to the right hepatic lobe. No evidence of extrahepatic activity. IMPRESSION: Successful Y - 90 microsphere delivery  for treatment of unresectable liver metastasis. First therapy to the right lobe. Bremssstrahlung scan demonstrates activity localized to right hepatic lobe with no extrahepatic activity identified. Electronically Signed   By: Kerby Moors M.D.   On: 04/04/2021 14:55   IR Angiogram Visceral Selective  Result Date: 04/04/2021 INDICATION: 80 year old male with right-sided colon adenocarcinoma metastatic to the liver. He has multifocal disease in the right hepatic lobe and presents today for right lobar trans arterial radio embolization with Y90. EXAM: IR EMBO TUMOR ORGAN ISCHEMIA INFARCT INC GUIDE ROADMAPPING; SELECTIVE VISCERAL ARTERIOGRAPHY; ADDITIONAL ARTERIOGRAPHY; IR ULTRASOUND GUIDANCE VASC ACCESS RIGHT MEDICATIONS: 3.375 g Zosyn; 12.5 mg Benadryl. The antibiotic was administered within 1 hour of the procedure ANESTHESIA/SEDATION: Moderate (conscious) sedation was employed during this procedure. A total of Versed 4 mg and Fentanyl 100 mcg was administered intravenously. Moderate Sedation Time: 52 minutes. The patient's level of consciousness and vital signs were monitored continuously by radiology nursing throughout the procedure under my direct supervision. CONTRAST:  50 mL Omnipaque 300 FLUOROSCOPY TIME:  Fluoroscopy Time: 5 minutes 12 seconds (511 mGy). COMPLICATIONS: None immediate. PROCEDURE: Informed consent was obtained from the patient following explanation of the procedure, risks, benefits and alternatives. The patient understands, agrees and consents for the procedure. All questions were addressed. A time out was performed prior to the initiation of the procedure. Maximal barrier sterile technique utilized including caps, mask, sterile gowns, sterile gloves, large sterile drape, hand hygiene, and Betadine prep. The right common femoral artery was interrogated with  ultrasound and found to be widely patent. An image was obtained and stored for the medical record. Local anesthesia was attained by  infiltration with 1% lidocaine. A small dermatotomy was made. Under real-time sonographic guidance, the vessel was punctured with a 21 gauge micropuncture needle. Using standard technique, the initial micro needle was exchanged over a 0.018 micro wire for a transitional 4 [place] micro sheath. The micro sheath was then exchanged over a 0.035 wire for a 5 French vascular sheath. A C2 cobra catheter was advanced over a Bentson wire into the abdominal aorta and used to select the superior mesenteric artery. A superior mesenteric arteriogram was performed. No evidence of accessory or replaced right hepatic artery. The portal vein remains patent. The C2 cobra catheter was next advanced into the celiac artery. A celiac arteriogram was performed to provide a map for catheterization of the common hepatic artery. The C2 catheter was then advanced over a Glidewire into the common hepatic artery. Arteriography was then performed in multiple obliquities. The origins of the left hepatic artery, segment 5/7 hepatic artery and main right hepatic artery were identified. A 2.5 French Cook Cantata microcatheter was advanced over a Fathom 16 wire in used to select the left hepatic artery. Arteriography was performed. No definite evidence of tumoral blush. The microcatheter was next advanced beyond the segment 5/7 branch and into the dominant right hepatic artery. Contrast injection was performed confirming on precise location of the microcatheter tip as well as multifocal tumoral blush consistent with known multifocal hepatic metastatic disease. The microcatheter was secured in place. Trans arterial radio embolization was then performed using our standard protocol. Resin Y 90 labeled microspheres were injected in aliquots. Antegrade flow was assessed multiple times throughout the duration of the injection confirming persistent antegrade flow and absence of reflux. Once the entire Y 90 dose was administered, the microcatheter was  brought back into the 5 [place] base catheter and the catheter system removed as a unit and appropriately disposed of. Hemostasis was then attained with the assistance of a Celt arterial closure device. IMPRESSION: Successful right lobar trans arterial radio embolization with Y90 sparing hepatic segments 5 and 7. Signed, Criselda Peaches, MD, [facility] Electronically Signed   By: Jacqulynn Cadet M.D.   On: 04/04/2021 11:58   IR Angiogram Visceral Selective  Result Date: 04/04/2021 INDICATION: 80 year old male with right-sided colon adenocarcinoma metastatic to the liver. He has multifocal disease in the right hepatic lobe and presents today for right lobar trans arterial radio embolization with Y90. EXAM: IR EMBO TUMOR ORGAN ISCHEMIA INFARCT INC GUIDE ROADMAPPING; SELECTIVE VISCERAL ARTERIOGRAPHY; ADDITIONAL ARTERIOGRAPHY; IR ULTRASOUND GUIDANCE VASC ACCESS RIGHT MEDICATIONS: 3.375 g Zosyn; 12.5 mg Benadryl. The antibiotic was administered within 1 hour of the procedure ANESTHESIA/SEDATION: Moderate (conscious) sedation was employed during this procedure. A total of Versed 4 mg and Fentanyl 100 mcg was administered intravenously. Moderate Sedation Time: 52 minutes. The patient's level of consciousness and vital signs were monitored continuously by radiology nursing throughout the procedure under my direct supervision. CONTRAST:  50 mL Omnipaque 300 FLUOROSCOPY TIME:  Fluoroscopy Time: 5 minutes 12 seconds (511 mGy). COMPLICATIONS: None immediate. PROCEDURE: Informed consent was obtained from the patient following explanation of the procedure, risks, benefits and alternatives. The patient understands, agrees and consents for the procedure. All questions were addressed. A time out was performed prior to the initiation of the procedure. Maximal barrier sterile technique utilized including caps, mask, sterile gowns, sterile gloves, large  sterile  drape, hand hygiene, and Betadine prep. The right common femoral artery was interrogated with ultrasound and found to be widely patent. An image was obtained and stored for the medical record. Local anesthesia was attained by infiltration with 1% lidocaine. A small dermatotomy was made. Under real-time sonographic guidance, the vessel was punctured with a 21 gauge micropuncture needle. Using standard technique, the initial micro needle was exchanged over a 0.018 micro wire for a transitional 4 [place] micro sheath. The micro sheath was then exchanged over a 0.035 wire for a 5 French vascular sheath. A C2 cobra catheter was advanced over a Bentson wire into the abdominal aorta and used to select the superior mesenteric artery. A superior mesenteric arteriogram was performed. No evidence of accessory or replaced right hepatic artery. The portal vein remains patent. The C2 cobra catheter was next advanced into the celiac artery. A celiac arteriogram was performed to provide a map for catheterization of the common hepatic artery. The C2 catheter was then advanced over a Glidewire into the common hepatic artery. Arteriography was then performed in multiple obliquities. The origins of the left hepatic artery, segment 5/7 hepatic artery and main right hepatic artery were identified. A 2.5 French Cook Cantata microcatheter was advanced over a Fathom 16 wire in used to select the left hepatic artery. Arteriography was performed. No definite evidence of tumoral blush. The microcatheter was next advanced beyond the segment 5/7 branch and into the dominant right hepatic artery. Contrast injection was performed confirming on precise location of the microcatheter tip as well as multifocal tumoral blush consistent with known multifocal hepatic metastatic disease. The microcatheter was secured in place. Trans arterial radio embolization was then performed using our standard protocol. Resin Y 90 labeled microspheres were injected in  aliquots. Antegrade flow was assessed multiple times throughout the duration of the injection confirming persistent antegrade flow and absence of reflux. Once the entire Y 90 dose was administered, the microcatheter was brought back into the 5 [place] base catheter and the catheter system removed as a unit and appropriately disposed of. Hemostasis was then attained with the assistance of a Celt arterial closure device. IMPRESSION: Successful right lobar trans arterial radio embolization with Y90 sparing hepatic segments 5 and 7. Signed, Criselda Peaches, MD, [place] [facility] Electronically Signed   By: Jacqulynn Cadet M.D.   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 He has multifocal disease in the right hepatic lobe and presents today for right lobar trans arterial radio embolization with Y90. EXAM: IR EMBO TUMOR ORGAN ISCHEMIA INFARCT INC GUIDE ROADMAPPING; SELECTIVE VISCERAL ARTERIOGRAPHY; ADDITIONAL ARTERIOGRAPHY; IR ULTRASOUND GUIDANCE VASC ACCESS RIGHT MEDICATIONS: 3.375 g Zosyn; 12.5 mg Benadryl. The antibiotic was administered within 1 hour of the procedure ANESTHESIA/SEDATION: Moderate (conscious) sedation was employed during this procedure. A total of Versed 4 mg and Fentanyl 100 mcg was administered intravenously. Moderate Sedation Time: 52 minutes. The patient's level of consciousness and vital signs were monitored continuously by radiology nursing throughout the procedure under my direct supervision. CONTRAST:  50 mL Omnipaque 300 FLUOROSCOPY TIME:  Fluoroscopy Time: 5 minutes 12 seconds (511 mGy). COMPLICATIONS: None immediate. PROCEDURE: Informed consent was obtained from the patient following explanation of the procedure, risks, benefits and alternatives. The patient understands, agrees and  consents for the procedure. All questions were addressed. A time out was performed prior to  the initiation of the procedure. Maximal barrier sterile technique utilized including caps, mask, sterile gowns, sterile gloves, large sterile drape, hand hygiene, and Betadine prep. The right common femoral artery was interrogated with ultrasound and found to be widely patent. An image was obtained and stored for the medical record. Local anesthesia was attained by infiltration with 1% lidocaine. A small dermatotomy was made. Under real-time sonographic guidance, the vessel was punctured with a 21 gauge micropuncture needle. Using standard technique, the initial micro needle was exchanged over a 0.018 micro wire for a transitional 4 [place] micro sheath. The micro sheath was then exchanged over a 0.035 wire for a 5 French vascular sheath. A C2 cobra catheter was advanced over a Bentson wire into the abdominal aorta and used to select the superior mesenteric artery. A superior mesenteric arteriogram was performed. No evidence of accessory or replaced right hepatic artery. The portal vein remains patent. The C2 cobra catheter was next advanced into the celiac artery. A celiac arteriogram was performed to provide a map for catheterization of the common hepatic artery. The C2 catheter was then advanced over a Glidewire into the common hepatic artery. Arteriography was then performed in multiple obliquities. The origins of the left hepatic artery, segment 5/7 hepatic artery and main right hepatic artery were identified. A 2.5 French Cook Cantata microcatheter was advanced over a Fathom 16 wire in used to select the left hepatic artery. Arteriography was performed. No definite evidence of tumoral blush. The microcatheter was next advanced beyond the segment 5/7 branch and into the dominant right hepatic artery. Contrast injection was performed confirming on precise location of the microcatheter tip as well as multifocal tumoral  blush consistent with known multifocal hepatic metastatic disease. The microcatheter was secured in place. Trans arterial radio embolization was then performed using our standard protocol. Resin Y 90 labeled microspheres were injected in aliquots. Antegrade flow was assessed multiple times throughout the duration of the injection confirming persistent antegrade flow and absence of reflux. Once the entire Y 90 dose was administered, the microcatheter was brought back into the 5 [place] base catheter and the catheter system removed as a unit and appropriately disposed of. Hemostasis was then attained with the assistance of a Celt arterial closure device. IMPRESSION: Successful right lobar trans arterial radio embolization with Y90 sparing hepatic segments 5 and 7. Signed, Criselda Peaches, MD, [facility] Radiology Electronically Signed   By: Jacqulynn Cadet M.D.   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 He has multifocal disease in the right hepatic lobe and presents today for right lobar trans arterial radio embolization with Y90. EXAM: IR EMBO TUMOR ORGAN ISCHEMIA INFARCT INC GUIDE ROADMAPPING; SELECTIVE VISCERAL ARTERIOGRAPHY; ADDITIONAL ARTERIOGRAPHY; IR ULTRASOUND GUIDANCE VASC ACCESS RIGHT MEDICATIONS: 3.375 g Zosyn; 12.5 mg Benadryl. The antibiotic was administered within 1 hour of the procedure ANESTHESIA/SEDATION: Moderate (conscious) sedation was employed during this procedure. A total of Versed 4 mg and Fentanyl 100 mcg was administered intravenously. Moderate Sedation Time: 52 minutes. The patient's level of consciousness and vital signs were monitored continuously by radiology nursing throughout the procedure under my direct supervision. CONTRAST:  50 mL Omnipaque 300 FLUOROSCOPY TIME:  Fluoroscopy  Time: 5 minutes 12 seconds (511 mGy). COMPLICATIONS: None immediate. PROCEDURE: Informed consent was obtained from the patient following explanation of the procedure, risks, benefits and alternatives. The patient understands,  agrees and consents for the procedure. All questions were addressed. A time out was performed prior to the initiation of the procedure. Maximal barrier sterile technique utilized including caps, mask, sterile gowns, sterile gloves, large sterile drape, hand hygiene, and Betadine prep. The right common femoral artery was interrogated with ultrasound and found to be widely patent. An image was obtained and stored for the medical record. Local anesthesia was attained by infiltration with 1% lidocaine. A small dermatotomy was made. Under real-time sonographic guidance, the vessel was punctured with a 21 gauge micropuncture needle. Using standard technique, the initial micro needle was exchanged over a 0.018 micro wire for a transitional 4 [place] micro sheath. The micro sheath was then exchanged over a 0.035 wire for a 5 French vascular sheath. A C2 cobra catheter was advanced over a Bentson wire into the abdominal aorta and used to select the superior mesenteric artery. A superior mesenteric arteriogram was performed. No evidence of accessory or replaced right hepatic artery. The portal vein remains patent. The C2 cobra catheter was next advanced into the celiac artery. A celiac arteriogram was performed to provide a map for catheterization of the common hepatic artery. The C2 catheter was then advanced over a Glidewire into the common hepatic artery. Arteriography was then performed in multiple obliquities. The origins of the left hepatic artery, segment 5/7 hepatic artery and main right hepatic artery were identified. A 2.5 French Cook Cantata microcatheter was advanced over a Fathom 16 wire in used to select the left hepatic artery. Arteriography was performed. No definite evidence of tumoral  blush. The microcatheter was next advanced beyond the segment 5/7 branch and into the dominant right hepatic artery. Contrast injection was performed confirming on precise location of the microcatheter tip as well as multifocal tumoral blush consistent with known multifocal hepatic metastatic disease. The microcatheter was secured in place. Trans arterial radio embolization was then performed using our standard protocol. Resin Y 90 labeled microspheres were injected in aliquots. Antegrade flow was assessed multiple times throughout the duration of the injection confirming persistent antegrade flow and absence of reflux. Once the entire Y 90 dose was administered, the microcatheter was brought back into the 5 [place] base catheter and the catheter system removed as a unit and appropriately disposed of. Hemostasis was then attained with the assistance of a Celt arterial closure device. IMPRESSION: Successful right lobar trans arterial radio embolization with Y90 sparing hepatic segments 5 and 7. Signed, Criselda Peaches, MD, [place] [facility] Radiology Electronically Signed   By: Jacqulynn Cadet M.D.   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 He has multifocal disease in the right hepatic lobe and presents today for right lobar trans arterial radio embolization with Y90. EXAM: IR EMBO TUMOR ORGAN ISCHEMIA INFARCT INC GUIDE ROADMAPPING; SELECTIVE VISCERAL ARTERIOGRAPHY; ADDITIONAL ARTERIOGRAPHY; IR ULTRASOUND GUIDANCE VASC ACCESS RIGHT MEDICATIONS: 3.375 g Zosyn; 12.5 mg Benadryl. The antibiotic was administered within 1 hour of the procedure ANESTHESIA/SEDATION: Moderate (conscious) sedation was employed during this procedure. A total of Versed 4 mg and Fentanyl 100 mcg was administered intravenously. Moderate Sedation  Time: 52 minutes. The patient's level of consciousness and vital signs were monitored continuously by radiology nursing throughout the procedure under my direct supervision. CONTRAST:  50 mL Omnipaque 300 FLUOROSCOPY TIME:  Fluoroscopy Time: 5 minutes 12 seconds (511 mGy). COMPLICATIONS: None immediate. PROCEDURE: Informed consent  was obtained from the patient following explanation of the procedure, risks, benefits and alternatives. The patient understands, agrees and consents for the procedure. All questions were addressed. A time out was performed prior to the initiation of the procedure. Maximal barrier sterile technique utilized including caps, mask, sterile gowns, sterile gloves, large sterile drape, hand hygiene, and Betadine prep. The right common femoral artery was interrogated with ultrasound and found to be widely patent. An image was obtained and stored for the medical record. Local anesthesia was attained by infiltration with 1% lidocaine. A small dermatotomy was made. Under real-time sonographic guidance, the vessel was punctured with a 21 gauge micropuncture needle. Using standard technique, the initial micro needle was exchanged over a 0.018 micro wire for a transitional 4 [place] micro sheath. The micro sheath was then exchanged over a 0.035 wire for a 5 French vascular sheath. A C2 cobra catheter was advanced over a Bentson wire into the abdominal aorta and used to select the superior mesenteric artery. A superior mesenteric arteriogram was performed. No evidence of accessory or replaced right hepatic artery. The portal vein remains patent. The C2 cobra catheter was next advanced into the celiac artery. A celiac arteriogram was performed to provide a map for catheterization of the common hepatic artery. The C2 catheter was then advanced over a Glidewire into the common hepatic artery. Arteriography was then performed in multiple obliquities. The origins of the left hepatic artery, segment 5/7 hepatic  artery and main right hepatic artery were identified. A 2.5 French Cook Cantata microcatheter was advanced over a Fathom 16 wire in used to select the left hepatic artery. Arteriography was performed. No definite evidence of tumoral blush. The microcatheter was next advanced beyond the segment 5/7 branch and into the dominant right hepatic artery. Contrast injection was performed confirming on precise location of the microcatheter tip as well as multifocal tumoral blush consistent with known multifocal hepatic metastatic disease. The microcatheter was secured in place. Trans arterial radio embolization was then performed using our standard protocol. Resin Y 90 labeled microspheres were injected in aliquots. Antegrade flow was assessed multiple times throughout the duration of the injection confirming persistent antegrade flow and absence of reflux. Once the entire Y 90 dose was administered, the microcatheter was brought back into the 5 [place] base catheter and the catheter system removed as a unit and appropriately disposed of. Hemostasis was then attained with the assistance of a Celt arterial closure device. IMPRESSION: Successful right lobar trans arterial radio embolization with Y90 sparing hepatic segments 5 and 7. Signed, Criselda Peaches, MD, [place] [facility] Radiology Electronically Signed   By: Jacqulynn Cadet M.D.   On: 04/04/2021 11:58   NM Special Med Rad Physics Cons  Result Date: 04/04/2021 CLINICAL DATA:  History of colon cancer with unresectable liver metastasis. EXAM: NUCLEAR MEDICINE SPECIAL MED RAD PHYSICS CONS; NUCLEAR MEDICINE RADIO PHARM THERAPY INTRA ARTERIAL; NUCLEAR MEDICINE TREATMENT PROCEDURE; NUCLEAR MEDICINE LIVER SCAN TECHNIQUE: In conjunction with the interventional radiologist a Y- Microsphere dose was calculated utilizing body surface area formulation. Calculated dose equal 35.1 mCi. Pre therapy MAA liver SPECT scan and CTA  were evaluated. Utilizing a microcatheter system, the hepatic artery was selected and Y-90 microspheres were delivered in fractionated aliquots. Radiopharmaceutical was delivered by the interventional radiologist and nuclear radiologist. The patient tolerated procedure well. No adverse effects were noted. Bremsstrahlung planar and SPECT imaging of the abdomen following intrahepatic arterial delivery of Y-90 microsphere was performed. RADIOPHARMACEUTICALS:  36.2  MCI Y-90 MICROSPHERES INSTILLED VIA INTRAHEPATIC ARTERY CATHETER COMPARISON:  02/27/2021 FINDINGS: Y - 90 microspheres therapy as above. First therapy the right hepatic lobe. Bremsstrahlung planar and SPECT imaging of the abdomen following intrahepatic arterial delivery of Y-40microsphere demonstrates radioactivity localized to the right hepatic lobe. No evidence of extrahepatic activity. IMPRESSION: Successful Y - 90 microsphere delivery for treatment of unresectable liver metastasis. First therapy to the right lobe. Bremssstrahlung scan demonstrates activity localized to right hepatic lobe with no extrahepatic activity identified. Electronically Signed   By: Kerby Moors M.D.   On: 04/04/2021 14:55   NM Special Treatment Procedure  Result Date: 04/04/2021 CLINICAL DATA:  History of colon cancer with unresectable liver metastasis. EXAM: NUCLEAR MEDICINE SPECIAL MED RAD PHYSICS CONS; NUCLEAR MEDICINE RADIO PHARM THERAPY INTRA ARTERIAL; NUCLEAR MEDICINE TREATMENT PROCEDURE; NUCLEAR MEDICINE LIVER SCAN TECHNIQUE: In conjunction with the interventional radiologist a Y- Microsphere dose was calculated utilizing body surface area formulation. Calculated dose equal 35.1 mCi. Pre therapy MAA liver SPECT scan and CTA were evaluated. Utilizing a microcatheter system, the hepatic artery was selected and Y-90 microspheres were delivered in fractionated aliquots. Radiopharmaceutical was delivered by the interventional radiologist and nuclear radiologist. The  patient tolerated procedure well. No adverse effects were noted. Bremsstrahlung planar and SPECT imaging of the abdomen following intrahepatic arterial delivery of Y-90 microsphere was performed. RADIOPHARMACEUTICALS:  36.2 MCI Y-90 MICROSPHERES INSTILLED VIA INTRAHEPATIC ARTERY CATHETER COMPARISON:  02/27/2021 FINDINGS: Y - 90 microspheres therapy as above. First therapy the right hepatic lobe. Bremsstrahlung planar and SPECT imaging of the abdomen following intrahepatic arterial delivery of Y-10microsphere demonstrates radioactivity localized to the right hepatic lobe. No evidence of extrahepatic activity. IMPRESSION: Successful Y - 90 microsphere delivery for treatment of unresectable liver metastasis. First therapy to the right lobe. Bremssstrahlung scan demonstrates activity localized to right hepatic lobe with no extrahepatic activity identified. Electronically Signed   By: Kerby Moors M.D.   On: 04/04/2021 14:55   IR US Guide Vasc Access Right  Result Date: 04/04/2021 INDICATION: 80 year old male with right-sided colon adenocarcinoma metastatic to the liver. He has multifocal disease in the right hepatic lobe and presents today for right lobar trans arterial radio embolization with Y90. EXAM: IR EMBO TUMOR ORGAN ISCHEMIA INFARCT INC GUIDE ROADMAPPING; SELECTIVE VISCERAL ARTERIOGRAPHY; ADDITIONAL ARTERIOGRAPHY; IR ULTRASOUND GUIDANCE VASC ACCESS RIGHT MEDICATIONS: 3.375 g Zosyn; 12.5 mg Benadryl. The antibiotic was administered within 1 hour of the procedure ANESTHESIA/SEDATION: Moderate (conscious) sedation was employed during this procedure. A total of Versed 4 mg and Fentanyl 100 mcg was administered intravenously. Moderate Sedation Time: 52 minutes. The patient's level of consciousness and vital signs were monitored continuously by radiology nursing throughout the procedure under my direct supervision. CONTRAST:  50 mL Omnipaque 300 FLUOROSCOPY TIME:  Fluoroscopy Time: 5 minutes 12 seconds (511  mGy). COMPLICATIONS: None immediate. PROCEDURE: Informed consent was obtained from the patient following explanation of the procedure, risks, benefits and alternatives. The patient understands, agrees and consents for the procedure. All questions were addressed. A time out was performed prior to the initiation of the procedure. Maximal barrier sterile technique utilized including caps, mask, sterile gowns, sterile gloves, large sterile drape, hand hygiene, and Betadine prep. The right common femoral artery was interrogated with ultrasound and found to be widely patent. An image was obtained and stored for the medical record. Local anesthesia was attained by infiltration with 1% lidocaine. A small dermatotomy was made. Under real-time sonographic guidance, the vessel was punctured with a 21 gauge micropuncture needle. Using  standard technique, the initial micro needle was exchanged over a 0.018 micro wire for a transitional 4 [place] micro sheath. The micro sheath was then exchanged over a 0.035 wire for a 5 French vascular sheath. A C2 cobra catheter was advanced over a Bentson wire into the abdominal aorta and used to select the superior mesenteric artery. A superior mesenteric arteriogram was performed. No evidence of accessory or replaced right hepatic artery. The portal vein remains patent. The C2 cobra catheter was next advanced into the celiac artery. A celiac arteriogram was performed to provide a map for catheterization of the common hepatic artery. The C2 catheter was then advanced over a Glidewire into the common hepatic artery. Arteriography was then performed in multiple obliquities. The origins of the left hepatic artery, segment 5/7 hepatic artery and main right hepatic artery were identified. A 2.5 French Cook Cantata microcatheter was advanced over a Fathom 16 wire in used to select the left hepatic artery. Arteriography was performed. No definite evidence of tumoral blush. The microcatheter was next  advanced beyond the segment 5/7 branch and into the dominant right hepatic artery. Contrast injection was performed confirming on precise location of the microcatheter tip as well as multifocal tumoral blush consistent with known multifocal hepatic metastatic disease. The microcatheter was secured in place. Trans arterial radio embolization was then performed using our standard protocol. Resin Y 90 labeled microspheres were injected in aliquots. Antegrade flow was assessed multiple times throughout the duration of the injection confirming persistent antegrade flow and absence of reflux. Once the entire Y 90 dose was administered, the microcatheter was brought back into the 5 [place] base catheter and the catheter system removed as a unit and appropriately disposed of. Hemostasis was then attained with the assistance of a Celt arterial closure device. IMPRESSION: Successful right lobar trans arterial radio embolization with Y90 sparing hepatic segments 5 and 7. Signed, Criselda Peaches, MD, [place] [facility] Radiology Electronically Signed   By: Jacqulynn Cadet M.D.   On: 04/04/2021 11:58   IR EMBO TUMOR ORGAN ISCHEMIA INFARCT INC GUIDE ROADMAPPING  Result Date: 04/04/2021 INDICATION: 80 year old male with right-sided colon adenocarcinoma metastatic to the liver. He has multifocal disease in the right hepatic lobe and presents today for right lobar trans arterial radio embolization with Y90. EXAM: IR EMBO TUMOR ORGAN ISCHEMIA INFARCT INC GUIDE ROADMAPPING; SELECTIVE VISCERAL ARTERIOGRAPHY; ADDITIONAL ARTERIOGRAPHY; IR ULTRASOUND GUIDANCE VASC ACCESS RIGHT MEDICATIONS: 3.375 g Zosyn; 12.5 mg Benadryl. The antibiotic was administered within 1 hour of the procedure ANESTHESIA/SEDATION: Moderate (conscious) sedation was employed during this procedure. A total of Versed 4 mg and Fentanyl 100 mcg was administered intravenously. Moderate Sedation Time: 52 minutes. The  patient's level of consciousness and vital signs were monitored continuously by radiology nursing throughout the procedure under my direct supervision. CONTRAST:  50 mL Omnipaque 300 FLUOROSCOPY TIME:  Fluoroscopy Time: 5 minutes 12 seconds (511 mGy). COMPLICATIONS: None immediate. PROCEDURE: Informed consent was obtained from the patient following explanation of the procedure, risks, benefits and alternatives. The patient understands, agrees and consents for the procedure. All questions were addressed. A time out was performed prior to the initiation of the procedure. Maximal barrier sterile technique utilized including caps, mask, sterile gowns, sterile gloves, large sterile drape, hand hygiene, and Betadine prep. The right common femoral artery was interrogated with ultrasound and found to be widely patent. An image was obtained and stored for the medical record. Local anesthesia was attained by infiltration with 1% lidocaine.  A small dermatotomy was made. Under real-time sonographic guidance, the vessel was punctured with a 21 gauge micropuncture needle. Using standard technique, the initial micro needle was exchanged over a 0.018 micro wire for a transitional 4 [place] micro sheath. The micro sheath was then exchanged over a 0.035 wire for a 5 French vascular sheath. A C2 cobra catheter was advanced over a Bentson wire into the abdominal aorta and used to select the superior mesenteric artery. A superior mesenteric arteriogram was performed. No evidence of accessory or replaced right hepatic artery. The portal vein remains patent. The C2 cobra catheter was next advanced into the celiac artery. A celiac arteriogram was performed to provide a map for catheterization of the common hepatic artery. The C2 catheter was then advanced over a Glidewire into the common hepatic artery. Arteriography was then performed in multiple obliquities. The origins of the left hepatic artery, segment 5/7 hepatic artery and main right  hepatic artery were identified. A 2.5 French Cook Cantata microcatheter was advanced over a Fathom 16 wire in used to select the left hepatic artery. Arteriography was performed. No definite evidence of tumoral blush. The microcatheter was next advanced beyond the segment 5/7 branch and into the dominant right hepatic artery. Contrast injection was performed confirming on precise location of the microcatheter tip as well as multifocal tumoral blush consistent with known multifocal hepatic metastatic disease. The microcatheter was secured in place. Trans arterial radio embolization was then performed using our standard protocol. Resin Y 90 labeled microspheres were injected in aliquots. Antegrade flow was assessed multiple times throughout the duration of the injection confirming persistent antegrade flow and absence of reflux. Once the entire Y 90 dose was administered, the microcatheter was brought back into the 5 [place] base catheter and the catheter system removed as a unit and appropriately disposed of. Hemostasis was then attained with the assistance of a Celt arterial closure device. IMPRESSION: Successful right lobar trans arterial radio embolization with Y90 sparing hepatic segments 5 and 7. Signed, Criselda Peaches, MD, [facility] Radiology Electronically Signed   By: Jacqulynn Cadet M.D.   On: 04/04/2021 11:58   NM Radio Pharm Therapy Intraarterial  Result Date: 04/04/2021 CLINICAL DATA:  History of colon cancer with unresectable liver metastasis. EXAM: NUCLEAR MEDICINE SPECIAL MED RAD PHYSICS CONS; NUCLEAR MEDICINE RADIO PHARM THERAPY INTRA ARTERIAL; NUCLEAR MEDICINE TREATMENT PROCEDURE; NUCLEAR MEDICINE LIVER SCAN TECHNIQUE: In conjunction with the interventional radiologist a Y- Microsphere dose was calculated utilizing body surface area formulation. Calculated dose equal 35.1 mCi. Pre therapy MAA liver SPECT scan and CTA were evaluated.  Utilizing a microcatheter system, the hepatic artery was selected and Y-90 microspheres were delivered in fractionated aliquots. Radiopharmaceutical was delivered by the interventional radiologist and nuclear radiologist. The patient tolerated procedure well. No adverse effects were noted. Bremsstrahlung planar and SPECT imaging of the abdomen following intrahepatic arterial delivery of Y-90 microsphere was performed. RADIOPHARMACEUTICALS:  36.2 MCI Y-90 MICROSPHERES INSTILLED VIA INTRAHEPATIC ARTERY CATHETER COMPARISON:  02/27/2021 FINDINGS: Y - 90 microspheres therapy as above. First therapy the right hepatic lobe. Bremsstrahlung planar and SPECT imaging of the abdomen following intrahepatic arterial delivery of Y-43microsphere demonstrates radioactivity localized to the right hepatic lobe. No evidence of extrahepatic activity. IMPRESSION: Successful Y - 90 microsphere delivery for treatment of unresectable liver metastasis. First therapy to the right lobe. Bremssstrahlung scan demonstrates activity localized to right hepatic lobe with no extrahepatic activity identified. Electronically Signed   By: Queen Slough.D.  On: 04/04/2021 14:55     ASSESSMENT & PLAN:   Cancer of right colon ([place]) #Right-sided colon adenocarcinoma-with synchronous metastasis to liver/unresectable.  Most recently s/p  FOLFIRI plus Avastin on HOLD-given local therapies to liver. s/p Ablation- MAY 2022 MRI- Multiple lvier lesions/progression-patient underwent s/p Y 90 on 06/17.  Awaiting reevaluation with IR tomorrow.  #In general given the progressive systemic disease-I would recommend initiation of oral TAS-102 (35 mg/m2 twice daily on days 1-5 and 8-12 every 28 days)  combined with intravenous zirabev/bevacizumab (5 mg/kg on days 1 and 15) until progression.  Addition of progression of bevacizumab has increased PFS compared to monotherapy.  Patient understand treatments are palliative not curative.  # PN- G-1-2; sec to  oxaliplatin-STABLE.   # Mediport- port flush q 2-3 months  # Shoulder pain-refill oxycodone Silver.[place] ]; STABLE.  # DISPOSITION:   # follow up in 2weeks-- MD; labs- cbc/cmp CEA/UA; zirabev-Dr.B .    All questions were answered. The patient knows to call the clinic with any problems, questions or concerns.    Cammie Sickle, MD 04/28/2021 11:28 AM

## 2021-04-28 NOTE — Telephone Encounter (Signed)
Oral Oncology Patient Advocate Encounter   Received notification from [facility] that prior authorization for [place] is required.   PA submitted on CoverMyMeds Key [id number]  Status is pending   Oral Oncology Clinic will continue to follow.  [place] [facility] [place] Phone [phone] Fax [phone] 05/07/2021 11:20 AM

## 2021-04-29 ENCOUNTER — Encounter: Payer: Self-pay | Admitting: *Deleted

## 2021-04-29 ENCOUNTER — Ambulatory Visit
Admission: RE | Admit: 2021-04-29 | Discharge: 2021-04-29 | Disposition: A | Discharge status: Home / Self Care | Payer: Medicare HMO | Source: Ambulatory Visit | Attending: Interventional Radiology | Admitting: Interventional Radiology

## 2021-04-29 DIAGNOSIS — Z9889 Other specified postprocedural states: Secondary | ICD-10-CM | POA: Diagnosis not present

## 2021-04-29 DIAGNOSIS — C189 Malignant neoplasm of colon, unspecified: Secondary | ICD-10-CM

## 2021-04-29 DIAGNOSIS — C787 Secondary malignant neoplasm of liver and intrahepatic bile duct: Secondary | ICD-10-CM | POA: Diagnosis not present

## 2021-04-29 HISTORY — PX: IR RADIOLOGIST EVAL & MGMT: IMG5224

## 2021-04-29 LAB — CEA: CEA: 34 ng/mL — ABNORMAL HIGH (ref 0.0–4.7)

## 2021-04-29 NOTE — Progress Notes (Signed)
Chief Complaint: Patient was consulted remotely today (TeleHealth) for colon cancer metastatic to the liver at the request of Cici Rodriges K.    Referring Physician(s): Dr. Charlaine Dalton  History of Present Illness: DEANDREW HOECKER is a 80 y.o. 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   Patient underwent neoadjuvant chemotherapy and right hemicolectomy (May 2020).  Hepatic metastatic disease also confirmed by biopsy in May 2020.  CT imaging in September 2020 showed progression of metastatic disease.  FOLFOX + Avastin begun Oct 2020.  In Sept 2021, CT imaging showed progression of disease and he was transitioned to FOLFIRI + Avastin.   MRI 11/27/20 shows stable hepatic disease with 2 discrete lesions (Seg 2/3 4.4 x 4.2 cm and Seg 6 1.2 cm).  However, Mr. Keisler also developed a peptic ulcer and has been begun on carafate and PPI.  Further chemo held for now and patient is referred for evaluation for liver directed therapy.   Of note, CT CAP from 09/24/20 also shows pulmonary metastatic disease and a peritoneal implant just caudal to the prior hemicolectomy anastomosis.     Mr. Zieger underwent bland embolization of the dominant lesion in the left hepatic lobe on 12/19/2020 followed by percutaneous thermal ablation on 01/15/2021.  The smaller lesion in hepatic segment 6 was not well visualized at that time and was not targeted.   MRI dated 02/27/21:  1. Ablation site of the anterior left lobe of the liver demonstrates no residual contrast enhancement. 2. Marked interval enlargement of a rim enhancing,metastatic lesion of the inferior right lobe of the liver, hepatic segment VI, measuring 3.3 x 2.8 cm, previously 1.1 cm. 3. New metastatic lesion just inferior, in the tip of the right lobe of the liver, hepatic segment VI, measuring 2.4 x 2.2 cm. 4. Redemonstrated lesion of the liver dome measuring approximately 7.0 x 6.8 cm. Although not changed in  size and previously described as a simple cyst, this lesion demonstrates new internal complexity and substantial heterogeneous peripheral contrast enhancement not seen on prior examination, concerning for metastatic lesion.  Note that this lesion was not present on examination dated 04/16/2018 and developed on examination dated 03/06/2019, not typical in behavior for a benign liver cyst.  04/04/21 - Right lobar TARE with Y90 resin microspheres.    Mr. Stecher reports that he is feeling well.  He was tired and fatigued for several days following his Y 90 treatment but that has since resolved.  His appetite remains good.  He denies abdominal pain, nausea, vomiting or other systemic symptoms.  Past Medical History:  Diagnosis Date   Arthritis    Cancer ([place])    colon cancer 02/2019 per pt    Family history of adverse reaction to anesthesia    cousin took all night to wake up from anesthesia   H/O colon cancer, stage IV    Hyperlipemia    Neuromuscular disorder ([place])    Pre-diabetes     Past Surgical History:  Procedure Laterality Date   COLON SURGERY     ESOPHAGOGASTRODUODENOSCOPY (EGD) WITH PROPOFOL N/A 12/26/2020   Procedure: ESOPHAGOGASTRODUODENOSCOPY (EGD) WITH PROPOFOL;  Surgeon: [name], MD;  Location: [facility] ENDOSCOPY;  Service: Gastroenterology;  Laterality: N/A;   IR ANGIOGRAM SELECTIVE EACH ADDITIONAL VESSEL  12/19/2020   IR ANGIOGRAM SELECTIVE EACH ADDITIONAL VESSEL  03/24/2021   IR ANGIOGRAM SELECTIVE EACH ADDITIONAL VESSEL  03/24/2021   IR ANGIOGRAM SELECTIVE EACH ADDITIONAL VESSEL  04/04/2021   IR ANGIOGRAM  SELECTIVE EACH ADDITIONAL VESSEL  04/04/2021   IR ANGIOGRAM SELECTIVE EACH ADDITIONAL VESSEL  04/04/2021   IR ANGIOGRAM VISCERAL SELECTIVE  12/19/2020   IR ANGIOGRAM VISCERAL SELECTIVE  03/24/2021   IR ANGIOGRAM VISCERAL SELECTIVE  04/04/2021   IR ANGIOGRAM VISCERAL SELECTIVE  04/04/2021   IR EMBO ARTERIAL NOT HEMORR HEMANG INC GUIDE ROADMAPPING  03/24/2021   IR EMBO TUMOR ORGAN  ISCHEMIA INFARCT INC GUIDE ROADMAPPING  12/19/2020   IR EMBO TUMOR ORGAN ISCHEMIA INFARCT INC GUIDE ROADMAPPING  04/04/2021   IR IMAGING GUIDED PORT INSERTION  07/20/2019   IR RADIOLOGIST EVAL & MGMT  12/05/2020   IR RADIOLOGIST EVAL & MGMT  02/11/2021   IR RADIOLOGIST EVAL & MGMT  03/04/2021   IR US GUIDE VASC ACCESS RIGHT  12/19/2020   IR US GUIDE VASC ACCESS RIGHT  03/24/2021   IR US GUIDE VASC ACCESS RIGHT  04/04/2021   JOINT REPLACEMENT Left 2010   Knee   RADIOLOGY WITH ANESTHESIA N/A 01/15/2021   Procedure: CT WITH ANESTHESIA MICROWAVE ABLATION OF LIVER;  Surgeon: Criselda Peaches, MD;  Location: [facility] ORS;  Service: Anesthesiology;  Laterality: N/A;    Allergies: Ace inhibitors  Medications: Prior to Admission medications   Medication Sig Start Date End Date Taking? Authorizing Provider  ondansetron (ZOFRAN) 4 MG tablet Take 1 tablet (4 mg total) by mouth every 8 (eight) hours as needed for nausea or vomiting. Patient not taking: No sig reported 01/15/21   Ardis Rowan, PA-C  ondansetron [facility]) 8 MG tablet Take 1 tablet (8 mg total) by mouth every 8 (eight) hours as needed for nausea or vomiting. Patient not taking: No sig reported 12/19/20   Louk, Bea Graff, PA-C  oxyCODONE (OXY IR/ROXICODONE) 5 MG immediate release tablet Take 1 tablet (5 mg total) by mouth every 6 (six) hours as needed for severe pain. 04/28/21   Borders, Kirt Boys, NP  pantoprazole (PROTONIX) 40 MG tablet Take 1 tablet (40 mg total) by mouth 2 (two) times daily. Patient not taking: No sig reported 11/27/20   Cammie Sickle, MD  pravastatin (PRAVACHOL) 40 MG tablet Take 40 mg by mouth daily.     [provider]  prochlorperazine (COMPAZINE) 10 MG tablet Take 1 tablet (10 mg total) by mouth every 6 (six) hours as needed for nausea or vomiting. Patient not taking: No sig reported 11/21/20   Borders, Kirt Boys, NP  sucralfate (CARAFATE) 1 g tablet TAKE 1 TABLET (1 G TOTAL) BY MOUTH 4 (FOUR) TIMES DAILY -  WITH MEALS AND AT BEDTIME. 03/05/21   Cammie Sickle, MD  tamsulosin (FLOMAX) 0.4 MG CAPS capsule Take 1 capsule (0.4 mg total) by mouth daily. 01/27/21 01/27/22  Borders, Kirt Boys, NP     Family History  Problem Relation Age of Onset   Peptic Ulcer Disease Father     Social History   Socioeconomic History   Marital status: Married    Spouse name: Not on file   Number of children: Not on file   Years of education: Not on file   Highest education level: Not on file  Occupational History   Not on file  Tobacco Use   Smoking status: Every Day    Packs/day: 0.25    Years: 15.00    Pack years: 3.75    Types: Cigarettes   Smokeless tobacco: Never   Tobacco comments:    1 to 2 cigarettes a day occasionally  Vaping Use   Vaping Use: Never used  Substance and Sexual Activity   Alcohol use: Yes    Comment: rarely    Drug use: No   Sexual activity: Not on file  Other Topics Concern   Not on file  Social History Narrative   Recruitment consultant retd; lives in [place]; smoking 3cig/day; [3/4 ppd x started at 9 years]; no alcohol. Son & daughter; wife dementia [waiting for placement].    Social Determinants of Health   Financial Resource Strain: Not on file  Food Insecurity: Not on file  Transportation Needs: Not on file  Physical Activity: Not on file  Stress: Not on file  Social Connections: Not on file    ECOG Status: 0 - Asymptomatic  Review of Systems  Review of Systems: A 12 point ROS discussed and pertinent positives are indicated in the HPI above.  All other systems are negative.  Physical Exam No direct physical exam was performed (except for noted visual exam findings with Video Visits).   Vital Signs: There were no vitals taken for this visit.  Imaging: NM LIVER TUMOR LOC IMFLAM SPECT 1 DAY  Result Date: 04/04/2021 CLINICAL DATA:  History of colon cancer with unresectable liver metastasis. EXAM: NUCLEAR MEDICINE SPECIAL MED RAD PHYSICS  CONS; NUCLEAR MEDICINE RADIO PHARM THERAPY INTRA ARTERIAL; NUCLEAR MEDICINE TREATMENT PROCEDURE; NUCLEAR MEDICINE LIVER SCAN TECHNIQUE: In conjunction with the interventional radiologist a Y- Microsphere dose was calculated utilizing body surface area formulation. Calculated dose equal 35.1 mCi. Pre therapy MAA liver SPECT scan and CTA were evaluated. Utilizing a microcatheter system, the hepatic artery was selected and Y-90 microspheres were delivered in fractionated aliquots. Radiopharmaceutical was delivered by the interventional radiologist and nuclear radiologist. The patient tolerated procedure well. No adverse effects were noted. Bremsstrahlung planar and SPECT imaging of the abdomen following intrahepatic arterial delivery of Y-90 microsphere was performed. RADIOPHARMACEUTICALS:  36.2 MCI Y-90 MICROSPHERES INSTILLED VIA INTRAHEPATIC ARTERY CATHETER COMPARISON:  02/27/2021 FINDINGS: Y - 90 microspheres therapy as above. First therapy the right hepatic lobe. Bremsstrahlung planar and SPECT imaging of the abdomen following intrahepatic arterial delivery of Y-47microsphere demonstrates radioactivity localized to the right hepatic lobe. No evidence of extrahepatic activity. IMPRESSION: Successful Y - 90 microsphere delivery for treatment of unresectable liver metastasis. First therapy to the right lobe. Bremssstrahlung scan demonstrates activity localized to right hepatic lobe with no extrahepatic activity identified. Electronically Signed   By: Kerby Moors M.D.   On: 04/04/2021 14:55   IR Angiogram Visceral Selective  Result Date: 04/04/2021 INDICATION: 80 year old male with right-sided colon adenocarcinoma metastatic to the liver. He has multifocal disease in the right hepatic lobe and presents today for right lobar trans arterial radio embolization with Y90. EXAM: IR EMBO TUMOR ORGAN ISCHEMIA INFARCT INC GUIDE ROADMAPPING; SELECTIVE VISCERAL ARTERIOGRAPHY; ADDITIONAL ARTERIOGRAPHY; IR ULTRASOUND  GUIDANCE VASC ACCESS RIGHT MEDICATIONS: 3.375 g Zosyn; 12.5 mg Benadryl. The antibiotic was administered within 1 hour of the procedure ANESTHESIA/SEDATION: Moderate (conscious) sedation was employed during this procedure. A total of Versed 4 mg and Fentanyl 100 mcg was administered intravenously. Moderate Sedation Time: 52 minutes. The patient's level of consciousness and vital signs were monitored continuously by radiology nursing throughout the procedure under my direct supervision. CONTRAST:  50 mL Omnipaque 300 FLUOROSCOPY TIME:  Fluoroscopy Time: 5 minutes 12 seconds (511 mGy). COMPLICATIONS: None immediate. PROCEDURE: Informed consent was obtained from the patient following explanation of the procedure, risks, benefits and alternatives. The patient understands, agrees and consents for the procedure. All questions were addressed. A time out was performed prior  to the initiation of the procedure. Maximal barrier sterile technique utilized including caps, mask, sterile gowns, sterile gloves, large sterile drape, hand hygiene, and Betadine prep. The right common femoral artery was interrogated with ultrasound and found to be widely patent. An image was obtained and stored for the medical record. Local anesthesia was attained by infiltration with 1% lidocaine. A small dermatotomy was made. Under real-time sonographic guidance, the vessel was punctured with a 21 gauge micropuncture needle. Using standard technique, the initial micro needle was exchanged over a 0.018 micro wire for a transitional 4 [place] micro sheath. The micro sheath was then exchanged over a 0.035 wire for a 5 French vascular sheath. A C2 cobra catheter was advanced over a Bentson wire into the abdominal aorta and used to select the superior mesenteric artery. A superior mesenteric arteriogram was performed. No evidence of accessory or replaced right hepatic artery. The portal vein remains patent. The C2 cobra catheter was next advanced into the  celiac artery. A celiac arteriogram was performed to provide a map for catheterization of the common hepatic artery. The C2 catheter was then advanced over a Glidewire into the common hepatic artery. Arteriography was then performed in multiple obliquities. The origins of the left hepatic artery, segment 5/7 hepatic artery and main right hepatic artery were identified. A 2.5 French Cook Cantata microcatheter was advanced over a Fathom 16 wire in used to select the left hepatic artery. Arteriography was performed. No definite evidence of tumoral blush. The microcatheter was next advanced beyond the segment 5/7 branch and into the dominant right hepatic artery. Contrast injection was performed confirming on precise location of the microcatheter tip as well as multifocal tumoral blush consistent with known multifocal hepatic metastatic disease. The microcatheter was secured in place. Trans arterial radio embolization was then performed using our standard protocol. Resin Y 90 labeled microspheres were injected in aliquots. Antegrade flow was assessed multiple times throughout the duration of the injection confirming persistent antegrade flow and absence of reflux. Once the entire Y 90 dose was administered, the microcatheter was brought back into the 5 [place] base catheter and the catheter system removed as a unit and appropriately disposed of. Hemostasis was then attained with the assistance of a Celt arterial closure device. IMPRESSION: Successful right lobar trans arterial radio embolization with Y90 sparing hepatic segments 5 and 7. Signed, Criselda Peaches, MD, [facility] [facility] Rad[facility] Electronically Signed   By: Jacqulynn Cadet M.D.   On: 04/04/2021 11:58   IR Angiogram Visceral Selective  Result Date: 04/04/2021 INDICATION: 80 year old male with right-sided colon adenocarcinoma metastatic to the liver. He has multifocal disease in the right hepatic lobe  and presents today for right lobar trans arterial radio embolization with Y90. EXAM: IR EMBO TUMOR ORGAN ISCHEMIA INFARCT INC GUIDE ROADMAPPING; SELECTIVE VISCERAL ARTERIOGRAPHY; ADDITIONAL ARTERIOGRAPHY; IR ULTRASOUND GUIDANCE VASC ACCESS RIGHT MEDICATIONS: 3.375 g Zosyn; 12.5 mg Benadryl. The antibiotic was administered within 1 hour of the procedure ANESTHESIA/SEDATION: Moderate (conscious) sedation was employed during this procedure. A total of Versed 4 mg and Fentanyl 100 mcg was administered intravenously. Moderate Sedation Time: 52 minutes. The patient's level of consciousness and vital signs were monitored continuously by radiology nursing throughout the procedure under my direct supervision. CONTRAST:  50 mL Omnipaque 300 FLUOROSCOPY TIME:  Fluoroscopy Time: 5 minutes 12 seconds (511 mGy). COMPLICATIONS: None immediate. PROCEDURE: Informed consent was obtained from the patient following explanation of the procedure, risks, benefits and alternatives. The patient understands, agrees  and consents for the procedure. All questions were addressed. A time out was performed prior to the initiation of the procedure. Maximal barrier sterile technique utilized including caps, mask, sterile gowns, sterile gloves, large sterile drape, hand hygiene, and Betadine prep. The right common femoral artery was interrogated with ultrasound and found to be widely patent. An image was obtained and stored for the medical record. Local anesthesia was attained by infiltration with 1% lidocaine. A small dermatotomy was made. Under real-time sonographic guidance, the vessel was punctured with a 21 gauge micropuncture needle. Using standard technique, the initial micro needle was exchanged over a 0.018 micro wire for a transitional 4 [place] micro sheath. The micro sheath was then exchanged over a 0.035 wire for a 5 French vascular sheath. A C2 cobra catheter was advanced over a Bentson wire into the abdominal aorta and used to select  the superior mesenteric artery. A superior mesenteric arteriogram was performed. No evidence of accessory or replaced right hepatic artery. The portal vein remains patent. The C2 cobra catheter was next advanced into the celiac artery. A celiac arteriogram was performed to provide a map for catheterization of the common hepatic artery. The C2 catheter was then advanced over a Glidewire into the common hepatic artery. Arteriography was then performed in multiple obliquities. The origins of the left hepatic artery, segment 5/7 hepatic artery and main right hepatic artery were identified. A 2.5 French Cook Cantata microcatheter was advanced over a Fathom 16 wire in used to select the left hepatic artery. Arteriography was performed. No definite evidence of tumoral blush. The microcatheter was next advanced beyond the segment 5/7 branch and into the dominant right hepatic artery. Contrast injection was performed confirming on precise location of the microcatheter tip as well as multifocal tumoral blush consistent with known multifocal hepatic metastatic disease. The microcatheter was secured in place. Trans arterial radio embolization was then performed using our standard protocol. Resin Y 90 labeled microspheres were injected in aliquots. Antegrade flow was assessed multiple times throughout the duration of the injection confirming persistent antegrade flow and absence of reflux. Once the entire Y 90 dose was administered, the microcatheter was brought back into the 5 [place] base catheter and the catheter system removed as a unit and appropriately disposed of. Hemostasis was then attained with the assistance of a Celt arterial closure device. IMPRESSION: Successful right lobar trans arterial radio embolization with Y90 sparing hepatic segments 5 and 7. Signed, Criselda Peaches, MD, [place] [facility] Radiology Electronically Signed   By: Jacqulynn Cadet M.D.   On:  04/04/2021 11:58   IR Angiogram Selective Each Additional Vessel  Result Date: 04/04/2021 INDICATION: 80 year old male with right-sided colon adenocarcinoma metastatic to the liver. He has multifocal disease in the right hepatic lobe and presents today for right lobar trans arterial radio embolization with Y90. EXAM: IR EMBO TUMOR ORGAN ISCHEMIA INFARCT INC GUIDE ROADMAPPING; SELECTIVE VISCERAL ARTERIOGRAPHY; ADDITIONAL ARTERIOGRAPHY; IR ULTRASOUND GUIDANCE VASC ACCESS RIGHT MEDICATIONS: 3.375 g Zosyn; 12.5 mg Benadryl. The antibiotic was administered within 1 hour of the procedure ANESTHESIA/SEDATION: Moderate (conscious) sedation was employed during this procedure. A total of Versed 4 mg and Fentanyl 100 mcg was administered intravenously. Moderate Sedation Time: 52 minutes. The patient's level of consciousness and vital signs were monitored continuously by radiology nursing throughout the procedure under my direct supervision. CONTRAST:  50 mL Omnipaque 300 FLUOROSCOPY TIME:  Fluoroscopy Time: 5 minutes 12 seconds (511 mGy). COMPLICATIONS: None immediate. PROCEDURE: Informed consent was  obtained from the patient following explanation of the procedure, risks, benefits and alternatives. The patient understands, agrees and consents for the procedure. All questions were addressed. A time out was performed prior to the initiation of the procedure. Maximal barrier sterile technique utilized including caps, mask, sterile gowns, sterile gloves, large sterile drape, hand hygiene, and Betadine prep. The right common femoral artery was interrogated with ultrasound and found to be widely patent. An image was obtained and stored for the medical record. Local anesthesia was attained by infiltration with 1% lidocaine. A small dermatotomy was made. Under real-time sonographic guidance, the vessel was punctured with a 21 gauge micropuncture needle. Using standard technique, the initial micro needle was exchanged over a 0.018  micro wire for a transitional 4 [place] micro sheath. The micro sheath was then exchanged over a 0.035 wire for a 5 French vascular sheath. A C2 cobra catheter was advanced over a Bentson wire into the abdominal aorta and used to select the superior mesenteric artery. A superior mesenteric arteriogram was performed. No evidence of accessory or replaced right hepatic artery. The portal vein remains patent. The C2 cobra catheter was next advanced into the celiac artery. A celiac arteriogram was performed to provide a map for catheterization of the common hepatic artery. The C2 catheter was then advanced over a Glidewire into the common hepatic artery. Arteriography was then performed in multiple obliquities. The origins of the left hepatic artery, segment 5/7 hepatic artery and main right hepatic artery were identified. A 2.5 French Cook Cantata microcatheter was advanced over a Fathom 16 wire in used to select the left hepatic artery. Arteriography was performed. No definite evidence of tumoral blush. The microcatheter was next advanced beyond the segment 5/7 branch and into the dominant right hepatic artery. Contrast injection was performed confirming on precise location of the microcatheter tip as well as multifocal tumoral blush consistent with known multifocal hepatic metastatic disease. The microcatheter was secured in place. Trans arterial radio embolization was then performed using our standard protocol. Resin Y 90 labeled microspheres were injected in aliquots. Antegrade flow was assessed multiple times throughout the duration of the injection confirming persistent antegrade flow and absence of reflux. Once the entire Y 90 dose was administered, the microcatheter was brought back into the 5 [place] base catheter and the catheter system removed as a unit and appropriately disposed of. Hemostasis was then attained with the assistance of a Celt arterial closure device. IMPRESSION: Successful right lobar trans  arterial radio embolization with Y90 sparing hepatic segments 5 and 7. Signed, Criselda Peaches, MD, [place] [facility]iology Electronically Signed   By: Jacqulynn Cadet M.D.   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 He has multifocal disease in the right hepatic lobe and presents today for right lobar trans arterial radio embolization with Y90. EXAM: IR EMBO TUMOR ORGAN ISCHEMIA INFARCT INC GUIDE ROADMAPPING; SELECTIVE VISCERAL ARTERIOGRAPHY; ADDITIONAL ARTERIOGRAPHY; IR ULTRASOUND GUIDANCE VASC ACCESS RIGHT MEDICATIONS: 3.375 g Zosyn; 12.5 mg Benadryl. The antibiotic was administered within 1 hour of the procedure ANESTHESIA/SEDATION: Moderate (conscious) sedation was employed during this procedure. A total of Versed 4 mg and Fentanyl 100 mcg was administered intravenously. Moderate Sedation Time: 52 minutes. The patient's level of consciousness and vital signs were monitored continuously by radiology nursing throughout the procedure under my direct supervision. CONTRAST:  50 mL Omnipaque 300 FLUOROSCOPY  TIME:  Fluoroscopy Time: 5 minutes 12 seconds (511 mGy). COMPLICATIONS: None immediate. PROCEDURE: Informed consent was obtained from the patient following explanation of the procedure, risks, benefits and alternatives. The patient understands, agrees and consents for the procedure. All questions were addressed. A time out was performed prior to the initiation of the procedure. Maximal barrier sterile technique utilized including caps, mask, sterile gowns, sterile gloves, large sterile drape, hand hygiene, and Betadine prep. The right common femoral artery was interrogated with ultrasound and found to be widely patent. An image was obtained and stored for the medical record. Local anesthesia was attained by  infiltration with 1% lidocaine. A small dermatotomy was made. Under real-time sonographic guidance, the vessel was punctured with a 21 gauge micropuncture needle. Using standard technique, the initial micro needle was exchanged over a 0.018 micro wire for a transitional 4 [place] micro sheath. The micro sheath was then exchanged over a 0.035 wire for a 5 French vascular sheath. A C2 cobra catheter was advanced over a Bentson wire into the abdominal aorta and used to select the superior mesenteric artery. A superior mesenteric arteriogram was performed. No evidence of accessory or replaced right hepatic artery. The portal vein remains patent. The C2 cobra catheter was next advanced into the celiac artery. A celiac arteriogram was performed to provide a map for catheterization of the common hepatic artery. The C2 catheter was then advanced over a Glidewire into the common hepatic artery. Arteriography was then performed in multiple obliquities. The origins of the left hepatic artery, segment 5/7 hepatic artery and main right hepatic artery were identified. A 2.5 French Cook Cantata microcatheter was advanced over a Fathom 16 wire in used to select the left hepatic artery. Arteriography was performed. No definite evidence of tumoral blush. The microcatheter was next advanced beyond the segment 5/7 branch and into the dominant right hepatic artery. Contrast injection was performed confirming on precise location of the microcatheter tip as well as multifocal tumoral blush consistent with known multifocal hepatic metastatic disease. The microcatheter was secured in place. Trans arterial radio embolization was then performed using our standard protocol. Resin Y 90 labeled microspheres were injected in aliquots. Antegrade flow was assessed multiple times throughout the duration of the injection confirming persistent antegrade flow and absence of reflux. Once the entire Y 90 dose was administered, the microcatheter was  brought back into the 5 [place] base catheter and the catheter system removed as a unit and appropriately disposed of. Hemostasis was then attained with the assistance of a Celt arterial closure device. IMPRESSION: Successful right lobar trans arterial radio embolization with Y90 sparing hepatic segments 5 and 7. Signed, Criselda Peaches, MD, [facility] [facility] Radiology Electronically Signed   By: Jacqulynn Cadet M.D.   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 He has multifocal disease in the right hepatic lobe and presents today for right lobar trans arterial radio embolization with Y90. EXAM: IR EMBO TUMOR ORGAN ISCHEMIA INFARCT INC GUIDE ROADMAPPING; SELECTIVE VISCERAL ARTERIOGRAPHY; ADDITIONAL ARTERIOGRAPHY; IR ULTRASOUND GUIDANCE VASC ACCESS RIGHT MEDICATIONS: 3.375 g Zosyn; 12.5 mg Benadryl. The antibiotic was administered within 1 hour of the procedure ANESTHESIA/SEDATION: Moderate (conscious) sedation was employed during this procedure. A total of Versed 4 mg and Fentanyl 100 mcg was administered intravenously. Moderate Sedation Time: 52 minutes. The patient's level of consciousness and vital signs were monitored continuously  by radiology nursing throughout the procedure under my direct supervision. CONTRAST:  50 mL Omnipaque 300 FLUOROSCOPY TIME:  Fluoroscopy Time: 5 minutes 12 seconds (511 mGy). COMPLICATIONS: None immediate. PROCEDURE: Informed consent was obtained from the patient following explanation of the procedure, risks, benefits and alternatives. The patient understands, agrees and consents for the procedure. All questions were addressed. A time out was performed prior to the initiation of the procedure. Maximal barrier sterile technique utilized including caps, mask, sterile gowns, sterile gloves,  large sterile drape, hand hygiene, and Betadine prep. The right common femoral artery was interrogated with ultrasound and found to be widely patent. An image was obtained and stored for the medical record. Local anesthesia was attained by infiltration with 1% lidocaine. A small dermatotomy was made. Under real-time sonographic guidance, the vessel was punctured with a 21 gauge micropuncture needle. Using standard technique, the initial micro needle was exchanged over a 0.018 micro wire for a transitional 4 [place] micro sheath. The micro sheath was then exchanged over a 0.035 wire for a 5 French vascular sheath. A C2 cobra catheter was advanced over a Bentson wire into the abdominal aorta and used to select the superior mesenteric artery. A superior mesenteric arteriogram was performed. No evidence of accessory or replaced right hepatic artery. The portal vein remains patent. The C2 cobra catheter was next advanced into the celiac artery. A celiac arteriogram was performed to provide a map for catheterization of the common hepatic artery. The C2 catheter was then advanced over a Glidewire into the common hepatic artery. Arteriography was then performed in multiple obliquities. The origins of the left hepatic artery, segment 5/7 hepatic artery and main right hepatic artery were identified. A 2.5 French Cook Cantata microcatheter was advanced over a Fathom 16 wire in used to select the left hepatic artery. Arteriography was performed. No definite evidence of tumoral blush. The microcatheter was next advanced beyond the segment 5/7 branch and into the dominant right hepatic artery. Contrast injection was performed confirming on precise location of the microcatheter tip as well as multifocal tumoral blush consistent with known multifocal hepatic metastatic disease. The microcatheter was secured in place. Trans arterial radio embolization was then performed using our standard protocol. Resin Y 90 labeled microspheres  were injected in aliquots. Antegrade flow was assessed multiple times throughout the duration of the injection confirming persistent antegrade flow and absence of reflux. Once the entire Y 90 dose was administered, the microcatheter was brought back into the 5 [place] base catheter and the catheter system removed as a unit and appropriately disposed of. Hemostasis was then attained with the assistance of a Celt arterial closure device. IMPRESSION: Successful right lobar trans arterial radio embolization with Y90 sparing hepatic segments 5 and 7. Signed, Criselda Peaches, MD, [place] [facility] Radiology Electronically Signed   By: Jacqulynn Cadet M.D.   On: 04/04/2021 11:58   NM Special Med Rad Physics Cons  Result Date: 04/04/2021 CLINICAL DATA:  History of colon cancer with unresectable liver metastasis. EXAM: NUCLEAR MEDICINE SPECIAL MED RAD PHYSICS CONS; NUCLEAR MEDICINE RADIO PHARM THERAPY INTRA ARTERIAL; NUCLEAR MEDICINE TREATMENT PROCEDURE; NUCLEAR MEDICINE LIVER SCAN TECHNIQUE: In conjunction with the interventional radiologist a Y- Microsphere dose was calculated utilizing body surface area formulation. Calculated dose equal 35.1 mCi. Pre therapy MAA liver SPECT scan and CTA were evaluated. Utilizing a microcatheter system, the hepatic artery was selected and Y-90 microspheres were delivered in fractionated aliquots. Radiopharmaceutical was delivered by the interventional radiologist  and nuclear radiologist. The patient tolerated procedure well. No adverse effects were noted. Bremsstrahlung planar and SPECT imaging of the abdomen following intrahepatic arterial delivery of Y-90 microsphere was performed. RADIOPHARMACEUTICALS:  36.2 MCI Y-90 MICROSPHERES INSTILLED VIA INTRAHEPATIC ARTERY CATHETER COMPARISON:  02/27/2021 FINDINGS: Y - 90 microspheres therapy as above. First therapy the right hepatic lobe. Bremsstrahlung planar and SPECT imaging of the  abdomen following intrahepatic arterial delivery of Y-63microsphere demonstrates radioactivity localized to the right hepatic lobe. No evidence of extrahepatic activity. IMPRESSION: Successful Y - 90 microsphere delivery for treatment of unresectable liver metastasis. First therapy to the right lobe. Bremssstrahlung scan demonstrates activity localized to right hepatic lobe with no extrahepatic activity identified. Electronically Signed   By: Kerby Moors M.D.   On: 04/04/2021 14:55   NM Special Treatment Procedure  Result Date: 04/04/2021 CLINICAL DATA:  History of colon cancer with unresectable liver metastasis. EXAM: NUCLEAR MEDICINE SPECIAL MED RAD PHYSICS CONS; NUCLEAR MEDICINE RADIO PHARM THERAPY INTRA ARTERIAL; NUCLEAR MEDICINE TREATMENT PROCEDURE; NUCLEAR MEDICINE LIVER SCAN TECHNIQUE: In conjunction with the interventional radiologist a Y- Microsphere dose was calculated utilizing body surface area formulation. Calculated dose equal 35.1 mCi. Pre therapy MAA liver SPECT scan and CTA were evaluated. Utilizing a microcatheter system, the hepatic artery was selected and Y-90 microspheres were delivered in fractionated aliquots. Radiopharmaceutical was delivered by the interventional radiologist and nuclear radiologist. The patient tolerated procedure well. No adverse effects were noted. Bremsstrahlung planar and SPECT imaging of the abdomen following intrahepatic arterial delivery of Y-90 microsphere was performed. RADIOPHARMACEUTICALS:  36.2 MCI Y-90 MICROSPHERES INSTILLED VIA INTRAHEPATIC ARTERY CATHETER COMPARISON:  02/27/2021 FINDINGS: Y - 90 microspheres therapy as above. First therapy the right hepatic lobe. Bremsstrahlung planar and SPECT imaging of the abdomen following intrahepatic arterial delivery of Y-77microsphere demonstrates radioactivity localized to the right hepatic lobe. No evidence of extrahepatic activity. IMPRESSION: Successful Y - 90 microsphere delivery for treatment of  unresectable liver metastasis. First therapy to the right lobe. Bremssstrahlung scan demonstrates activity localized to right hepatic lobe with no extrahepatic activity identified. Electronically Signed   By: Kerby Moors M.D.   On: 04/04/2021 14:55   IR US Guide Vasc Access Right  Result Date: 04/04/2021 INDICATION: 80 year old male with right-sided colon adenocarcinoma metastatic to the liver. He has multifocal disease in the right hepatic lobe and presents today for right lobar trans arterial radio embolization with Y90. EXAM: IR EMBO TUMOR ORGAN ISCHEMIA INFARCT INC GUIDE ROADMAPPING; SELECTIVE VISCERAL ARTERIOGRAPHY; ADDITIONAL ARTERIOGRAPHY; IR ULTRASOUND GUIDANCE VASC ACCESS RIGHT MEDICATIONS: 3.375 g Zosyn; 12.5 mg Benadryl. The antibiotic was administered within 1 hour of the procedure ANESTHESIA/SEDATION: Moderate (conscious) sedation was employed during this procedure. A total of Versed 4 mg and Fentanyl 100 mcg was administered intravenously. Moderate Sedation Time: 52 minutes. The patient's level of consciousness and vital signs were monitored continuously by radiology nursing throughout the procedure under my direct supervision. CONTRAST:  50 mL Omnipaque 300 FLUOROSCOPY TIME:  Fluoroscopy Time: 5 minutes 12 seconds (511 mGy). COMPLICATIONS: None immediate. PROCEDURE: Informed consent was obtained from the patient following explanation of the procedure, risks, benefits and alternatives. The patient understands, agrees and consents for the procedure. All questions were addressed. A time out was performed prior to the initiation of the procedure. Maximal barrier sterile technique utilized including caps, mask, sterile gowns, sterile gloves, large sterile drape, hand hygiene, and Betadine prep. The right common femoral artery was interrogated with ultrasound and found to be widely patent. An image was obtained and stored  for the medical record. Local anesthesia was attained by infiltration with 1%  lidocaine. A small dermatotomy was made. Under real-time sonographic guidance, the vessel was punctured with a 21 gauge micropuncture needle. Using standard technique, the initial micro needle was exchanged over a 0.018 micro wire for a transitional 4 [place] micro sheath. The micro sheath was then exchanged over a 0.035 wire for a 5 French vascular sheath. A C2 cobra catheter was advanced over a Bentson wire into the abdominal aorta and used to select the superior mesenteric artery. A superior mesenteric arteriogram was performed. No evidence of accessory or replaced right hepatic artery. The portal vein remains patent. The C2 cobra catheter was next advanced into the celiac artery. A celiac arteriogram was performed to provide a map for catheterization of the common hepatic artery. The C2 catheter was then advanced over a Glidewire into the common hepatic artery. Arteriography was then performed in multiple obliquities. The origins of the left hepatic artery, segment 5/7 hepatic artery and main right hepatic artery were identified. A 2.5 French Cook Cantata microcatheter was advanced over a Fathom 16 wire in used to select the left hepatic artery. Arteriography was performed. No definite evidence of tumoral blush. The microcatheter was next advanced beyond the segment 5/7 branch and into the dominant right hepatic artery. Contrast injection was performed confirming on precise location of the microcatheter tip as well as multifocal tumoral blush consistent with known multifocal hepatic metastatic disease. The microcatheter was secured in place. Trans arterial radio embolization was then performed using our standard protocol. Resin Y 90 labeled microspheres were injected in aliquots. Antegrade flow was assessed multiple times throughout the duration of the injection confirming persistent antegrade flow and absence of reflux. Once the entire Y 90 dose was administered, the microcatheter was brought back into the 5  [place] base catheter and the catheter system removed as a unit and appropriately disposed of. Hemostasis was then attained with the assistance of a Celt arterial closure device. IMPRESSION: Successful right lobar trans arterial radio embolization with Y90 sparing hepatic segments 5 and 7. Signed, Criselda Peaches, MD, [place] [facility] Radiology Electronically Signed   By: Jacqulynn Cadet M.D.   On: 04/04/2021 11:58   IR EMBO TUMOR ORGAN ISCHEMIA INFARCT INC GUIDE ROADMAPPING  Result Date: 04/04/2021 INDICATION: 80 year old male with right-sided colon adenocarcinoma metastatic to the liver. He has multifocal disease in the right hepatic lobe and presents today for right lobar trans arterial radio embolization with Y90. EXAM: IR EMBO TUMOR ORGAN ISCHEMIA INFARCT INC GUIDE ROADMAPPING; SELECTIVE VISCERAL ARTERIOGRAPHY; ADDITIONAL ARTERIOGRAPHY; IR ULTRASOUND GUIDANCE VASC ACCESS RIGHT MEDICATIONS: 3.375 g Zosyn; 12.5 mg Benadryl. The antibiotic was administered within 1 hour of the procedure ANESTHESIA/SEDATION: Moderate (conscious) sedation was employed during this procedure. A total of Versed 4 mg and Fentanyl 100 mcg was administered intravenously. Moderate Sedation Time: 52 minutes. The patient's level of consciousness and vital signs were monitored continuously by radiology nursing throughout the procedure under my direct supervision. CONTRAST:  50 mL Omnipaque 300 FLUOROSCOPY TIME:  Fluoroscopy Time: 5 minutes 12 seconds (511 mGy). COMPLICATIONS: None immediate. PROCEDURE: Informed consent was obtained from the patient following explanation of the procedure, risks, benefits and alternatives. The patient understands, agrees and consents for the procedure. All questions were addressed. A time out was performed prior to the initiation of the procedure. Maximal barrier sterile technique utilized including caps, mask, sterile gowns, sterile gloves, large  sterile drape, hand hygiene, and Betadine prep.  The right common femoral artery was interrogated with ultrasound and found to be widely patent. An image was obtained and stored for the medical record. Local anesthesia was attained by infiltration with 1% lidocaine. A small dermatotomy was made. Under real-time sonographic guidance, the vessel was punctured with a 21 gauge micropuncture needle. Using standard technique, the initial micro needle was exchanged over a 0.018 micro wire for a transitional 4 [place] micro sheath. The micro sheath was then exchanged over a 0.035 wire for a 5 French vascular sheath. A C2 cobra catheter was advanced over a Bentson wire into the abdominal aorta and used to select the superior mesenteric artery. A superior mesenteric arteriogram was performed. No evidence of accessory or replaced right hepatic artery. The portal vein remains patent. The C2 cobra catheter was next advanced into the celiac artery. A celiac arteriogram was performed to provide a map for catheterization of the common hepatic artery. The C2 catheter was then advanced over a Glidewire into the common hepatic artery. Arteriography was then performed in multiple obliquities. The origins of the left hepatic artery, segment 5/7 hepatic artery and main right hepatic artery were identified. A 2.5 French Cook Cantata microcatheter was advanced over a Fathom 16 wire in used to select the left hepatic artery. Arteriography was performed. No definite evidence of tumoral blush. The microcatheter was next advanced beyond the segment 5/7 branch and into the dominant right hepatic artery. Contrast injection was performed confirming on precise location of the microcatheter tip as well as multifocal tumoral blush consistent with known multifocal hepatic metastatic disease. The microcatheter was secured in place. Trans arterial radio embolization was then performed using our standard protocol. Resin Y 90 labeled microspheres were  injected in aliquots. Antegrade flow was assessed multiple times throughout the duration of the injection confirming persistent antegrade flow and absence of reflux. Once the entire Y 90 dose was administered, the microcatheter was brought back into the 5 [place] base catheter and the catheter system removed as a unit and appropriately disposed of. Hemostasis was then attained with the assistance of a Celt arterial closure device. IMPRESSION: Successful right lobar trans arterial radio embolization with Y90 sparing hepatic segments 5 and 7. Signed, Criselda Peaches, MD, [place] [facility] Radiology Electronically Signed   By: Jacqulynn Cadet M.D.   On: 04/04/2021 11:58   NM Radio Pharm Therapy Intraarterial  Result Date: 04/04/2021 CLINICAL DATA:  History of colon cancer with unresectable liver metastasis. EXAM: NUCLEAR MEDICINE SPECIAL MED RAD PHYSICS CONS; NUCLEAR MEDICINE RADIO PHARM THERAPY INTRA ARTERIAL; NUCLEAR MEDICINE TREATMENT PROCEDURE; NUCLEAR MEDICINE LIVER SCAN TECHNIQUE: In conjunction with the interventional radiologist a Y- Microsphere dose was calculated utilizing body surface area formulation. Calculated dose equal 35.1 mCi. Pre therapy MAA liver SPECT scan and CTA were evaluated. Utilizing a microcatheter system, the hepatic artery was selected and Y-90 microspheres were delivered in fractionated aliquots. Radiopharmaceutical was delivered by the interventional radiologist and nuclear radiologist. The patient tolerated procedure well. No adverse effects were noted. Bremsstrahlung planar and SPECT imaging of the abdomen following intrahepatic arterial delivery of Y-90 microsphere was performed. RADIOPHARMACEUTICALS:  36.2 MCI Y-90 MICROSPHERES INSTILLED VIA INTRAHEPATIC ARTERY CATHETER COMPARISON:  02/27/2021 FINDINGS: Y - 90 microspheres therapy as above. First therapy the right hepatic lobe. Bremsstrahlung planar and SPECT imaging of the  abdomen following intrahepatic arterial delivery of Y-56microsphere demonstrates radioactivity localized to the right hepatic lobe. No evidence of extrahepatic activity. IMPRESSION: Successful Y - 90 microsphere delivery for  treatment of unresectable liver metastasis. First therapy to the right lobe. Bremssstrahlung scan demonstrates activity localized to right hepatic lobe with no extrahepatic activity identified. Electronically Signed   By: Kerby Moors M.D.   On: 04/04/2021 14:55    Labs:  CBC: Recent Labs    03/24/21 0825 04/01/21 1002 04/04/21 0811 04/28/21 1019  WBC 5.8 6.1 5.6 5.1  HGB 12.7* 12.1* 11.7* 11.5*  HCT 38.5* 35.0* 35.6* 34.4*  PLT 153 148* 161 146*    COAGS: Recent Labs    12/19/20 0954 01/15/21 0739 03/24/21 0825 04/04/21 0811  INR 1.0 0.9 1.1 1.2  APTT  --   --   --  40*    BMP: Recent Labs    06/10/20 0858 06/25/20 0841 07/08/20 0843 07/22/20 0836 08/05/20 0821 03/24/21 0825 04/01/21 1002 04/04/21 0811 04/28/21 1019  NA 138 138 139 138   < > 141 135 142 138  K 3.7 3.6 3.6 3.6   < > 4.0 3.6 3.6 3.8  CL 107 108 108 107   < > 107 102 110 107  CO2 25 24 25 25    < > 27 25 26 24   GLUCOSE 205* 201* 209* 195*   < > 126* 216* 121* 207*  BUN 11 10 9 10    < > 13 13 15 12   CALCIUM 8.2* 8.0* 8.0* 8.1*   < > 9.1 8.5* 8.9 8.6*  CREATININE 0.89 0.80 0.70 0.89   < > 0.89 0.88 0.86 0.83  GFRNONAA >60 >60 >60 >60   < > >60 >60 >60 >60  GFRAA >60 >60 >60 >60  --   --   --   --   --    < > = values in this interval not displayed.    LIVER FUNCTION TESTS: Recent Labs    03/24/21 0825 04/01/21 1002 04/04/21 0811 04/28/21 1019  BILITOT 0.9 0.7 0.8 0.9  AST 28 27 28  36  ALT 17 16 16 22   ALKPHOS 110 111 116 232*  PROT 7.5 6.9 7.0 6.8  ALBUMIN 3.9 3.3* 3.6 3.0*    TUMOR MARKERS: No results for input(s): AFPTM, CEA, CA199, CHROMGRNA in the last 8760 hours.  Assessment and Plan:  80 year old male doing well and completely recovered 3 weeks status  post right lobar transarterial radioembolization with Y 90 for colon cancer metastatic to the liver.  His recovery was uneventful.  We will plan on pursuing active surveillance.  1.)  MRI liver with gadolinium contrast in late September 2022 with accompanying clinic visit.    Electronically Signed: Criselda Peaches 04/29/2021, 10:29 AM   I spent a total of  15 Minutes in remote  clinical consultation, greater than 50% of which was counseling/coordinating care for colon cancer metastatic to the liver.    Visit type: Audio only (telephone). Audio (no video) only due to patient preference. Alternative for in-person consultation at [facility], [address], [place], [place]. This visit type was conducted due to national recommendations for restrictions regarding the COVID-19 Pandemic (e.g. social distancing).  This format is felt to be most appropriate for this patient at this time.  All issues noted in this document were discussed and addressed.

## 2021-05-07 ENCOUNTER — Telehealth: Payer: Self-pay

## 2021-05-07 ENCOUNTER — Telehealth: Payer: Self-pay | Admitting: Pharmacy Technician

## 2021-05-07 ENCOUNTER — Other Ambulatory Visit (HOSPITAL_COMMUNITY): Payer: Self-pay

## 2021-05-07 DIAGNOSIS — C182 Malignant neoplasm of ascending colon: Secondary | ICD-10-CM

## 2021-05-07 MED ORDER — LONSURF 15-6.14 MG PO TABS: 35.0000 mg/m2 | ORAL_TABLET | Freq: Two times a day (BID) | ORAL | Status: DC

## 2021-05-07 NOTE — Telephone Encounter (Signed)
Oral Oncology Patient Advocate Encounter  Met patient at appointment on 9/45/03 to complete application for [employer] Patient Assistance program in an effort to reduce patient's out of pocket expense for Lonsurf to $0.    Application completed and faxed to [phone].   [employer] patient assistance phone number for follow up is [phone].   This encounter will be updated until final determination.   [place] Patient [name] Phone [phone] Fax [phone] 05/07/2021 4:30 PM

## 2021-05-07 NOTE — Telephone Encounter (Signed)
Oral Oncology Patient Advocate Encounter  Prior Authorization for [name] has been approved.    PA# [id number] Effective dates: 05/07/21 through 10/18/21  Patients co-pay is $3357.49.    Oral Oncology Clinic will continue to follow.   [place] Patient [place] Phone [phone] Fax [phone] 05/07/2021 11:59 AM

## 2021-05-07 NOTE — Telephone Encounter (Signed)
Oral Oncology Pharmacist Encounter  Received new prescription for Lonsurf (trifluridine and tipiracil) for the palliative treatment of metastatic colon adenocarcinoma in conjunction with Zirabev (bevacizumab), planned duration until disease progression or unacceptable drug toxicity.  Labs from 04/28/21 assessed, no relevant lab abnormalities.   Current medication list in Epic reviewed, no DDIs with Lonsurf (trifluridine and tipiracil) identified:  Evaluated chart and no patient barriers to medication adherence identified.   Oral Oncology Clinic will continue to follow for insurance authorization, copayment issues, initial counseling and start date.  Patient agreed to treatment on 04/28/21 per MD documentation.  Benn Moulder, PharmD Pharmacy Resident  05/07/2021 11:14 AM

## 2021-05-12 ENCOUNTER — Inpatient Hospital Stay: Payer: Medicare HMO

## 2021-05-12 ENCOUNTER — Inpatient Hospital Stay: Payer: Medicare HMO | Admitting: Pharmacist

## 2021-05-12 ENCOUNTER — Other Ambulatory Visit: Payer: Self-pay

## 2021-05-12 ENCOUNTER — Inpatient Hospital Stay: Payer: Medicare HMO | Admitting: Internal Medicine

## 2021-05-12 DIAGNOSIS — Z5112 Encounter for antineoplastic immunotherapy: Secondary | ICD-10-CM | POA: Diagnosis not present

## 2021-05-12 DIAGNOSIS — C182 Malignant neoplasm of ascending colon: Secondary | ICD-10-CM

## 2021-05-12 DIAGNOSIS — C787 Secondary malignant neoplasm of liver and intrahepatic bile duct: Secondary | ICD-10-CM | POA: Diagnosis not present

## 2021-05-12 DIAGNOSIS — Z7189 Other specified counseling: Secondary | ICD-10-CM

## 2021-05-12 LAB — COMPREHENSIVE METABOLIC PANEL
ALT: 16 U/L (ref 0–44)
AST: 36 U/L (ref 15–41)
Albumin: 3 g/dL — ABNORMAL LOW (ref 3.5–5.0)
Alkaline Phosphatase: 208 U/L — ABNORMAL HIGH (ref 38–126)
Anion gap: 6 (ref 5–15)
BUN: 12 mg/dL (ref 8–23)
CO2: 24 mmol/L (ref 22–32)
Calcium: 8.3 mg/dL — ABNORMAL LOW (ref 8.9–10.3)
Chloride: 107 mmol/L (ref 98–111)
Creatinine, Ser: 0.86 mg/dL (ref 0.61–1.24)
GFR, Estimated: >60 mL/min (ref >60)
Glucose, Bld: 197 mg/dL — ABNORMAL HIGH (ref 70–99)
Potassium: 3.6 mmol/L (ref 3.5–5.1)
Sodium: 137 mmol/L (ref 135–145)
Total Bilirubin: 0.9 mg/dL (ref 0.3–1.2)
Total Protein: 6.7 g/dL (ref 6.5–8.1)

## 2021-05-12 LAB — CBC WITH DIFFERENTIAL/PLATELET
Abs Immature Granulocytes: 0.01 10*3/uL (ref 0.00–0.07)
Basophils Absolute: 0.1 10*3/uL (ref 0.0–0.1)
Basophils Relative: 1 %
Eosinophils Absolute: 0.4 10*3/uL (ref 0.0–0.5)
Eosinophils Relative: 7 %
HCT: 35.2 % — ABNORMAL LOW (ref 39.0–52.0)
Hemoglobin: 11.6 g/dL — ABNORMAL LOW (ref 13.0–17.0)
Immature Granulocytes: 0 %
Lymphocytes Relative: 15 %
Lymphs Abs: 0.8 10*3/uL (ref 0.7–4.0)
MCH: 32.4 pg (ref 26.0–34.0)
MCHC: 33 g/dL (ref 30.0–36.0)
MCV: 98.3 fL (ref 80.0–100.0)
Monocytes Absolute: 0.5 10*3/uL (ref 0.1–1.0)
Monocytes Relative: 9 %
Neutro Abs: 3.6 10*3/uL (ref 1.7–7.7)
Neutrophils Relative %: 68 %
Platelets: 158 10*3/uL (ref 150–400)
RBC: 3.58 MIL/uL — ABNORMAL LOW (ref 4.22–5.81)
RDW: 13.9 % (ref 11.5–15.5)
WBC: 5.3 10*3/uL (ref 4.0–10.5)
nRBC: 0 % (ref 0.0–0.2)

## 2021-05-12 LAB — URINALYSIS, COMPLETE (UACMP) WITH MICROSCOPIC
Glucose, UA: NEGATIVE mg/dL
Hgb urine dipstick: NEGATIVE
Ketones, ur: 5 mg/dL — AB
Leukocytes,Ua: NEGATIVE
Nitrite: NEGATIVE
Protein, ur: 100 mg/dL — AB
Specific Gravity, Urine: 1.03 (ref 1.005–1.030)
pH: 5 (ref 5.0–8.0)

## 2021-05-12 MED ORDER — HEPARIN SOD (PORK) LOCK FLUSH 100 UNIT/ML IV SOLN
500.0000 [IU] | Freq: Once | INTRAVENOUS | Status: DC | PRN
  Filled 2021-05-12: qty 5

## 2021-05-12 MED ORDER — SODIUM CHLORIDE 0.9 % IV SOLN
Freq: Once | INTRAVENOUS | Status: AC
  Filled 2021-05-12: qty 250

## 2021-05-12 MED ORDER — SODIUM CHLORIDE 0.9 % IV SOLN
5.0000 mg/kg | Freq: Once | INTRAVENOUS | Status: AC
  Administered 2021-05-12: 400 mg via INTRAVENOUS
  Filled 2021-05-12: qty 16

## 2021-05-12 MED ORDER — SODIUM CHLORIDE 0.9% FLUSH
10.0000 mL | Freq: Once | INTRAVENOUS | Status: AC
  Administered 2021-05-12: 10 mL via INTRAVENOUS
  Filled 2021-05-12: qty 10

## 2021-05-12 MED ORDER — HEPARIN SOD (PORK) LOCK FLUSH 100 UNIT/ML IV SOLN
INTRAVENOUS | Status: AC
  Filled 2021-05-12: qty 5

## 2021-05-12 MED ORDER — HEPARIN SOD (PORK) LOCK FLUSH 100 UNIT/ML IV SOLN
500.0000 [IU] | Freq: Once | INTRAVENOUS | Status: AC
  Administered 2021-05-12: 500 [IU] via INTRAVENOUS
  Filled 2021-05-12: qty 5

## 2021-05-12 NOTE — Assessment & Plan Note (Addendum)
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  Most recently s/p  FOLFIRI plus Avastin on HOLD-given local therapies to liver. s/p Ablation- MAY 2022 MRI- Multiple lvier lesions/progression-patient underwent s/p Y 90 on 06/17; awaiting repeat MRI in late sep, 2022 [IR].    # Proceed with start of oral TAS-102 (35 mg/m2 twice daily on days 1-5 and 8-12 every 28 days)  combined with intravenous zirabev/bevacizumab (5 mg/kg on days 1 and 15) until progression.   Patient understand treatments are palliative not curative.  While awaiting formal prescription will start with; sample at lower dose.  Discussed with Ebony Hail.  Patient again educated about the schedule of the oral therapy.  Again reviewed the potential side effects including but not limited to nausea vomiting diarrhea sores in the mouth neutropenia etc.  # PN- G-1-2; sec to oxaliplatin-STABLE.   # Mediport- functioning- STABLE.   # Shoulder pain-refill oxycodone prn[[name] ]- STABLE.   # DISPOSITION:  # zirabev today.  # follow up in 2weeks-- MD; labs- cbc/cmp CEA/UA; zirabev-Dr.B

## 2021-05-12 NOTE — Progress Notes (Signed)
[facility] NOTE  Patient Care Team: Sofie Hartigan, MD as PCP - General (Family Medicine) Cammie Sickle, MD as Consulting Physician (Hematology and Oncology)  CHIEF COMPLAINTS/PURPOSE OF CONSULTATION: Colon cancer  #  Oncology History Overview Note  # MAY 2020- 3. 03/09/19 Liver biopsy. Microscopic examination shows malignant cells with glandular architecture consistent with adenocarcinoma. The malignant cells are positive for CK20 and CDX-2. These findings support the clinical impression of metastatic colon adenocarcinoma. 4. 03/10/19 R hemicolectomy. Tumor site cecum. Adenocarcinoma. Mucinous features present. G2. No tumor deposits. Invades visceral peritoneum. No tumor perforation. LVI present. PNI not identified. All margins uninvolved. 1/12 LNs. PT4apN1. Periappendiceal inflammation c/w resolving abscess. Microsatellite stable (MSS). [Dr.Mettu; [facility]]  # SEP 4th 2020 [compared to May 2020]  Interval increase in size of the metastases to the hepatic dome, The metastasis to the left hepatic lobe is unchanged; 2.  New subcentimeter hypoattenuating lesion in the inferior right hepatic lobe, incompletely characterized on CT. 3.  Postsurgical changes following right hemicolectomy.  # OCT 2020- FOLFOX +avastin; CT dec 22nd 2020- [compared to [facility] sep 9th 2020]-Liver- slight progression versus stable disease; CT scan SEP 4th 2021- Progressive disease-left lower lobe lung nodule 8 mm [previously 4 mm]; increase in size of the hepatic metastases by few millimeters.;  Soft tissue nodule adjacent to anastomotic site again increased by few millimeters.STOP FOLFOX; cont avastin  #  SEP 20th, 2021- FOLFIRI+ AVASTIN; HOLD FEB 2022- sec to poor toelrance [peptic ulcer]; 3/30-liver ablation-   [bland embolization of segment 2/3 liver lesion on 12/19/2020;  microwave ablation of both liver lesions.  # FEB /16-EGD [Dr.Anna-duodenal ulcer-? meloxicam]  # JAN 2022- COVID [s/p Mab  infusion; pills; skin rash resolved.]  # NGS/F-ONE-MUTATED K-RAS [G]  # PALLIATIVE CARE EVALUATION: 09/20/2019-[name]  # PAIN MANAGEMENT: NA   DIAGNOSIS: COLON CANCER  STAGE:  IV     ;  GOALS:Palliative  CURRENT/MOST RECENT THERAPY : FOLFIRI+ avastin [C]    Cancer of right colon ([place])  07/05/2019 Initial Diagnosis   Cancer of right colon ([place])   07/24/2019 - 06/25/2020 Chemotherapy   The patient had dexamethasone (DECADRON) 4 MG tablet, 8 mg, Oral, Daily, 1 of 1 cycle, Start date: --, End date: -- palonosetron (ALOXI) injection 0.25 mg, 0.25 mg, Intravenous,  Once, 23 of 25 cycles Administration: 0.25 mg (07/24/2019), 0.25 mg (08/07/2019), 0.25 mg (08/21/2019), 0.25 mg (09/04/2019), 0.25 mg (09/20/2019), 0.25 mg (10/04/2019), 0.25 mg (10/16/2019), 0.25 mg (10/30/2019), 0.25 mg (11/22/2019), 0.25 mg (12/06/2019), 0.25 mg (12/20/2019), 0.25 mg (01/03/2020), 0.25 mg (01/29/2020), 0.25 mg (02/12/2020), 0.25 mg (02/26/2020), 0.25 mg (03/11/2020), 0.25 mg (03/25/2020), 0.25 mg (04/08/2020), 0.25 mg (04/24/2020), 0.25 mg (05/08/2020), 0.25 mg (05/22/2020), 0.25 mg (06/10/2020), 0.25 mg (06/25/2020) leucovorin 800 mg in dextrose 5 % 250 mL infusion, 844 mg, Intravenous,  Once, 23 of 25 cycles Administration: 800 mg (07/24/2019), 800 mg (08/07/2019), 800 mg (08/21/2019), 800 mg (09/04/2019), 800 mg (09/20/2019), 800 mg (10/04/2019), 800 mg (10/16/2019), 800 mg (10/30/2019), 800 mg (11/22/2019), 800 mg (12/06/2019), 800 mg (12/20/2019), 800 mg (01/03/2020), 800 mg (01/29/2020), 800 mg (02/12/2020), 800 mg (02/26/2020), 800 mg (03/11/2020), 800 mg (03/25/2020), 800 mg (04/08/2020), 800 mg (04/24/2020), 800 mg (05/08/2020), 800 mg (05/22/2020), 800 mg (06/10/2020), 800 mg (06/25/2020) oxaliplatin (ELOXATIN) 180 mg in dextrose 5 % 500 mL chemo infusion, 85 mg/m2 = 180 mg, Intravenous,  Once, 23 of 25 cycles Dose modification: 178 mg (original dose 85 mg/m2, Cycle 17, Reason: Other (see comments), Comment: insurance adjusted  dose ) Administration: 180 mg  (07/24/2019), 180 mg (08/07/2019), 180 mg (08/21/2019), 180 mg (09/04/2019), 180 mg (09/20/2019), 180 mg (10/04/2019), 180 mg (10/16/2019), 180 mg (10/30/2019), 180 mg (11/22/2019), 180 mg (12/06/2019), 180 mg (12/20/2019), 180 mg (01/03/2020), 180 mg (01/29/2020), 180 mg (02/12/2020), 180 mg (02/26/2020), 180 mg (03/11/2020), 180 mg (04/08/2020), 180 mg (04/24/2020), 180 mg (05/08/2020), 180 mg (05/22/2020), 180 mg (06/10/2020), 180 mg (06/25/2020) fluorouracil (ADRUCIL) 5,000 mg in sodium chloride 0.9 % 150 mL chemo infusion, 5,050 mg, Intravenous, 1 Day/Dose, 23 of 25 cycles Administration: 5,000 mg (07/24/2019), 5,000 mg (08/07/2019), 5,000 mg (08/21/2019), 5,050 mg (09/04/2019), 5,000 mg (10/04/2019), 5,000 mg (10/16/2019), 5,000 mg (11/22/2019), 5,000 mg (12/06/2019), 5,000 mg (12/20/2019), 5,000 mg (01/03/2020), 5,000 mg (01/29/2020), 5,000 mg (02/12/2020), 5,000 mg (02/26/2020), 5,000 mg (03/11/2020), 5,000 mg (03/25/2020), 5,000 mg (04/08/2020), 5,000 mg (04/24/2020), 5,000 mg (05/08/2020), 5,000 mg (05/22/2020), 5,000 mg (06/10/2020), 5,000 mg (06/25/2020) bevacizumab-bvzr (ZIRABEV) 400 mg in sodium chloride 0.9 % 100 mL chemo infusion, 5 mg/kg = 400 mg, Intravenous,  Once, 23 of 25 cycles Administration: 400 mg (08/07/2019), 400 mg (08/21/2019), 400 mg (09/04/2019), 400 mg (09/20/2019), 400 mg (10/04/2019), 400 mg (10/16/2019), 400 mg (10/30/2019), 400 mg (11/22/2019), 400 mg (12/06/2019), 400 mg (12/20/2019), 400 mg (01/03/2020), 400 mg (01/29/2020), 400 mg (02/12/2020), 400 mg (02/26/2020), 400 mg (03/11/2020), 400 mg (03/25/2020), 400 mg (04/08/2020), 400 mg (04/24/2020), 400 mg (05/08/2020), 400 mg (05/22/2020), 400 mg (06/10/2020), 400 mg (06/25/2020)   for chemotherapy treatment.     07/08/2020 -  Chemotherapy    Patient is on Treatment Plan: COLORECTAL FOLFIRI / BEVACIZUMAB Q14D       11/05/2020 Cancer Staging   Staging form: Colon and Rectum, AJCC 8th Edition - Clinical: Stage IVC (pM1c) - Signed by Cammie Sickle, MD on 11/05/2020       HISTORY OF PRESENTING ILLNESS:  Oscar [name] 80 y.o.  male with metastatic colon cancer to the liver most recently noted to have progressive disease in the liver on MRI ; currently s/p Y 90 treatment-last treatment on June 17-is here for follow-up/proceed with bevacizumab plus Lonsurf.  In the interim patient was evaluated by IR posttreatment.  Patient denies any worsening abdominal pain nausea vomiting.  No fever chills.  Continues her chronic shoulder pain not any worse.  Improved with oxycodone as needed.   Review of Systems  Constitutional:  Positive for malaise/fatigue. Negative for chills, diaphoresis and fever.  HENT:  Negative for nosebleeds and sore throat.   Eyes:  Negative for double vision.  Respiratory:  Negative for cough, hemoptysis, sputum production, shortness of breath and wheezing.   Cardiovascular:  Negative for chest pain, palpitations, orthopnea and leg swelling.  Gastrointestinal:  Negative for blood in stool, constipation, heartburn, melena, nausea and vomiting.  Genitourinary:  Negative for dysuria, frequency and urgency.  Musculoskeletal:  Positive for back pain and joint pain.  Skin: Negative.  Negative for itching and rash.  Neurological:  Positive for tingling. Negative for focal weakness, weakness and headaches.  Endo/Heme/Allergies:  Does not bruise/bleed easily.  Psychiatric/Behavioral:  Negative for depression. The patient is not nervous/anxious and does not have insomnia.     MEDICAL HISTORY:  Past Medical History:  Diagnosis Date   Arthritis    Cancer ([place])    colon cancer 02/2019 per pt    Family history of adverse reaction to anesthesia    cousin took all night to wake up from anesthesia   H/O colon cancer, stage IV    Hyperlipemia  Neuromuscular disorder ([place])    Pre-diabetes     SURGICAL HISTORY: Past Surgical History:  Procedure Laterality Date   COLON SURGERY     ESOPHAGOGASTRODUODENOSCOPY (EGD) WITH PROPOFOL N/A 12/26/2020    Procedure: ESOPHAGOGASTRODUODENOSCOPY (EGD) WITH PROPOFOL;  Surgeon: [name], MD;  Location[facility] ENDOSCOPY;  Service: Gastroenterology;  Laterality: N/A;   IR ANGIOGRAM SELECTIVE EACH ADDITIONAL VESSEL  12/19/2020   IR ANGIOGRAM SELECTIVE EACH ADDITIONAL VESSEL  03/24/2021   IR ANGIOGRAM SELECTIVE EACH ADDITIONAL VESSEL  03/24/2021   IR ANGIOGRAM SELECTIVE EACH ADDITIONAL VESSEL  04/04/2021   IR ANGIOGRAM SELECTIVE EACH ADDITIONAL VESSEL  04/04/2021   IR ANGIOGRAM SELECTIVE EACH ADDITIONAL VESSEL  04/04/2021   IR ANGIOGRAM VISCERAL SELECTIVE  12/19/2020   IR ANGIOGRAM VISCERAL SELECTIVE  03/24/2021   IR ANGIOGRAM VISCERAL SELECTIVE  04/04/2021   IR ANGIOGRAM VISCERAL SELECTIVE  04/04/2021   IR EMBO ARTERIAL NOT HEMORR HEMANG INC GUIDE ROADMAPPING  03/24/2021   IR EMBO TUMOR ORGAN ISCHEMIA INFARCT INC GUIDE ROADMAPPING  12/19/2020   IR EMBO TUMOR ORGAN ISCHEMIA INFARCT INC GUIDE ROADMAPPING  04/04/2021   IR IMAGING GUIDED PORT INSERTION  07/20/2019   IR RADIOLOGIST EVAL & MGMT  12/05/2020   IR RADIOLOGIST EVAL & MGMT  02/11/2021   IR RADIOLOGIST EVAL & MGMT  03/04/2021   IR RADIOLOGIST EVAL & MGMT  04/29/2021   IR US GUIDE VASC ACCESS RIGHT  12/19/2020   IR US GUIDE VASC ACCESS RIGHT  03/24/2021   IR US GUIDE VASC ACCESS RIGHT  04/04/2021   JOINT REPLACEMENT Left 2010   Knee   RADIOLOGY WITH ANESTHESIA N/A 01/15/2021   Procedure: CT WITH ANESTHESIA MICROWAVE ABLATION OF LIVER;  Surgeon: Criselda Peaches, MD;  Location: [facility] ORS;  Service: Anesthesiology;  Laterality: N/A;    SOCIAL HISTORY: Social History   Socioeconomic History   Marital status: Married    Spouse name: Not on file   Number of children: Not on file   Years of education: Not on file   Highest education level: Not on file  Occupational History   Not on file  Tobacco Use   Smoking status: Every Day    Packs/day: 0.25    Years: 15.00    Pack years: 3.75    Types: Cigarettes   Smokeless tobacco: Never   Tobacco comments:    1 to 2  cigarettes a day occasionally  Vaping Use   Vaping Use: Never used  Substance and Sexual Activity   Alcohol use: Yes    Comment: rarely    Drug use: No   Sexual activity: Not on file  Other Topics Concern   Not on file  Social History Narrative   Recruitment consultant retd; lives in [place]; smoking 3cig/day; [3/4 ppd x started at 9 years]; no alcohol. Son & daughter; wife dementia [waiting for placement].    Social Determinants of Health   Financial Resource Strain: Not on file  Food Insecurity: Not on file  Transportation Needs: Not on file  Physical Activity: Not on file  Stress: Not on file  Social Connections: Not on file  Intimate Partner Violence: Not on file    FAMILY HISTORY: Family History  Problem Relation Age of Onset   Peptic Ulcer Disease Father     ALLERGIES:  is allergic to ace inhibitors.  MEDICATIONS:  Current Outpatient Medications  Medication Sig Dispense Refill   amLODipine (NORVASC) 5 MG tablet Take 5 mg by mouth daily.     oxyCODONE (OXY IR/ROXICODONE)  5 MG immediate release tablet Take 1 tablet (5 mg total) by mouth every 6 (six) hours as needed for severe pain. 60 tablet 0   pravastatin (PRAVACHOL) 40 MG tablet Take 40 mg by mouth daily.      sucralfate (CARAFATE) 1 g tablet TAKE 1 TABLET (1 G TOTAL) BY MOUTH 4 (FOUR) TIMES DAILY - WITH MEALS AND AT BEDTIME. 90 tablet 3   tamsulosin (FLOMAX) 0.4 MG CAPS capsule Take 1 capsule (0.4 mg total) by mouth daily. 30 capsule 11   ondansetron (ZOFRAN) 4 MG tablet Take 1 tablet (4 mg total) by mouth every 8 (eight) hours as needed for nausea or vomiting. (Patient not taking: No sig reported) 20 tablet 0   ondansetron (ZOFRAN) 8 MG tablet Take 1 tablet (8 mg total) by mouth every 8 (eight) hours as needed for nausea or vomiting. (Patient not taking: No sig reported) 20 tablet 0   pantoprazole (PROTONIX) 40 MG tablet Take 1 tablet (40 mg total) by mouth 2 (two) times daily. (Patient not taking: No sig  reported) 60 tablet 3   prochlorperazine (COMPAZINE) 10 MG tablet Take 1 tablet (10 mg total) by mouth every 6 (six) hours as needed for nausea or vomiting. (Patient not taking: No sig reported) 60 tablet 0   trifluridine-tipiracil (LONSURF) 15-6.14 MG tablet Take 5 tablets (75 mg of trifluridine total) by mouth 2 (two) times daily after a meal. 1 hr after AM & PM meals on days 1-5, 8-12. Repeat every 28day (Patient not taking: Reported on 05/12/2021) 100 tablet    No current facility-administered medications for this visit.   Facility-Administered Medications Ordered in Other Visits  Medication Dose Route Frequency Provider Last Rate Last Admin   heparin lock flush 100 unit/mL  500 Units Intravenous Once Cammie Sickle, MD          .  PHYSICAL EXAMINATION: ECOG PERFORMANCE STATUS: 0 - Asymptomatic  Vitals:   05/12/21 0839  BP: 101/61  Pulse: 73  Resp: 20  Temp: 98 F (36.7 C)   Filed Weights   05/12/21 0839  Weight: 184 lb 9.6 oz (83.7 kg)    Physical Exam HENT:     Head: Normocephalic and atraumatic.     Mouth/Throat:     Pharynx: No oropharyngeal exudate.     Comments: Whitish patches noted right side.  Eyes:     Pupils: Pupils are equal, round, and reactive to light.  Cardiovascular:     Rate and Rhythm: Normal rate and regular rhythm.  Pulmonary:     Effort: No respiratory distress.     Breath sounds: No wheezing.     Comments: Decreased breath sounds bilaterally.  No wheeze or crackles Abdominal:     General: Bowel sounds are normal. There is no distension.     Palpations: Abdomen is soft. There is no mass.     Tenderness: There is no abdominal tenderness. There is no guarding or rebound.  Musculoskeletal:        General: No tenderness. Normal range of motion.     Cervical back: Normal range of motion and neck supple.  Skin:    General: Skin is warm.  Neurological:     Mental Status: He is alert and oriented to person, place, and time.  Psychiatric:         Mood and Affect: Affect normal.   LABORATORY DATA:  I have reviewed the data as listed Lab Results  Component Value Date   WBC 5.3 05/12/2021  HGB 11.6 (L) 05/12/2021   HCT 35.2 (L) 05/12/2021   MCV 98.3 05/12/2021   PLT 158 05/12/2021   Recent Labs    06/25/20 0841 07/08/20 0843 07/22/20 0836 08/05/20 0821 04/04/21 0811 04/28/21 1019 05/12/21 0824  NA 138 139 138   < > 142 138 137  K 3.6 3.6 3.6   < > 3.6 3.8 3.6  CL 108 108 107   < > 110 107 107  CO2 $Re'24 25 25   'RCW$ < > $R'26 24 24  'Pu$ GLUCOSE 201* 209* 195*   < > 121* 207* 197*  BUN $Re'10 9 10   'tOc$ < > $R'15 12 12  'gQ$ CREATININE 0.80 0.70 0.89   < > 0.86 0.83 0.86  CALCIUM 8.0* 8.0* 8.1*   < > 8.9 8.6* 8.3*  GFRNONAA >60 >60 >60   < > >60 >60 >60  GFRAA >60 >60 >60  --   --   --   --   PROT 6.5 6.5 6.7   < > 7.0 6.8 6.7  ALBUMIN 3.2* 3.1* 3.1*   < > 3.6 3.0* 3.0*  AST 32 31 29   < > 28 36 36  ALT $Re'20 19 19   'uJh$ < > $R'16 22 16  'rH$ ALKPHOS 121 102 108   < > 116 232* 208*  BILITOT 0.8 0.7 0.8   < > 0.8 0.9 0.9   < > = values in this interval not displayed.    RADIOGRAPHIC STUDIES: I have personally reviewed the radiological images as listed and agreed with the findings in the report. IR Radiologist Eval & Mgmt  Result Date: 04/29/2021 Please refer to notes tab for details about interventional procedure. (Op Note)    ASSESSMENT & PLAN:   Cancer of right colon ([place]) #Right-sided colon adenocarcinoma-with synchronous metastasis to liver/unresectable.  Most recently s/p  FOLFIRI plus Avastin on HOLD-given local therapies to liver. s/p Ablation- MAY 2022 MRI- Multiple lvier lesions/progression-patient underwent s/p Y 90 on 06/17; awaiting repeat MRI in late sep, 2022 [IR].    # Proceed with start of oral TAS-102 (35 mg/m2 twice daily on days 1-5 and 8-12 every 28 days)  combined with intravenous zirabev/bevacizumab (5 mg/kg on days 1 and 15) until progression.   Patient understand treatments are palliative not curative.  While awaiting formal  prescription will start with; sample at lower dose.  Discussed with Ebony Hail.  Patient again educated about the schedule of the oral therapy.  Again reviewed the potential side effects including but not limited to nausea vomiting diarrhea sores in the mouth neutropenia etc.  # PN- G-1-2; sec to oxaliplatin-STABLE.   # Mediport- functioning- STABLE.   # Shoulder pain-refill oxycodone prn[[name] ]- STABLE.   # DISPOSITION:   # zirabev today.  # follow up in 2weeks-- MD; labs- cbc/cmp CEA/UA; zirabev-Dr.B  All questions were answered. The patient knows to call the clinic with any problems, questions or concerns.    Cammie Sickle, MD 05/12/2021 9:18 AM

## 2021-05-12 NOTE — Progress Notes (Signed)
0942: Per Dr. Rogue Bussing proceed with Noah Charon prior to Urine Protein results.   1020: Urine protein 100, Dr. Rogue Bussing aware.

## 2021-05-12 NOTE — Progress Notes (Signed)
[facility]  [phone] [phone]  Patient Care Team: Sofie Hartigan, MD as PCP - General (Family Medicine) Cammie Sickle, MD as Consulting Physician (Hematology and Oncology)   Name of the patient: Oscar [name]  [id number]  12-13-1940   Date of visit: 05/12/21  HPI: Patient is a 80 y.o. male with metastatic colon adenocarcinoma. Planned treatment with Lonsurf and Zirabev (bevacizumab). Patient to start treatment today, 05/12/2021.  Reason for Consult: Lonsurf oral chemotherapy education.   PAST MEDICAL HISTORY: Past Medical History:  Diagnosis Date   Arthritis    Cancer ([place])    colon cancer 02/2019 per pt    Family history of adverse reaction to anesthesia    cousin took all night to wake up from anesthesia   H/O colon cancer, stage IV    Hyperlipemia    Neuromuscular disorder [facility])    Pre-diabetes     HEMATOLOGY/ONCOLOGY HISTORY:  Oncology History Overview Note  # MAY 2020- 3. 03/09/19 Liver biopsy. Microscopic examination shows malignant cells with glandular architecture consistent with adenocarcinoma. The malignant cells are positive for CK20 and CDX-2. These findings support the clinical impression of metastatic colon adenocarcinoma. 4. 03/10/19 R hemicolectomy. Tumor site cecum. Adenocarcinoma. Mucinous features present. G2. No tumor deposits. Invades visceral peritoneum. No tumor perforation. LVI present. PNI not identified. All margins uninvolved. 1/12 LNs. PT4apN1. Periappendiceal inflammation c/w resolving abscess. Microsatellite stable (MSS). [Dr.Mettu; [facility]]  # SEP 4th 2020 [compared to May 2020]  Interval increase in size of the metastases to the hepatic dome, The metastasis to the left hepatic lobe is unchanged; 2.  New subcentimeter hypoattenuating lesion in the inferior right hepatic lobe, incompletely characterized on CT. 3.  Postsurgical changes following right  hemicolectomy.  # OCT 2020- FOLFOX +avastin; CT dec 22nd 2020- [compared to [facility] sep 9th 2020]-Liver- slight progression versus stable disease; CT scan SEP 4th 2021- Progressive disease-left lower lobe lung nodule 8 mm [previously 4 mm]; increase in size of the hepatic metastases by few millimeters.;  Soft tissue nodule adjacent to anastomotic site again increased by few millimeters.STOP FOLFOX; cont avastin  #  SEP 20th, 2021- FOLFIRI+ AVASTIN; HOLD FEB 2022- sec to poor toelrance [peptic ulcer]; 3/30-liver ablation-   [bland embolization of segment 2/3 liver lesion on 12/19/2020;  microwave ablation of both liver lesions.  # FEB /16-EGD [Dr.Anna-duodenal ulcer-? meloxicam]  # JAN 2022- COVID [s/p Mab infusion; pills; skin rash resolved.]  # NGS/F-ONE-MUTATED K-RAS [G]  # PALLIATIVE CARE EVALUATION: 09/20/2019-[name]  # PAIN MANAGEMENT: NA   DIAGNOSIS: COLON CANCER  STAGE:  IV     ;  GOALS:Palliative  CURRENT/MOST RECENT THERAPY : FOLFIRI+ avastin [C]    Cancer of right colon ([place])  07/05/2019 Initial Diagnosis   Cancer of right colon ([place])   07/24/2019 - 06/25/2020 Chemotherapy   The patient had dexamethasone (DECADRON) 4 MG tablet, 8 mg, Oral, Daily, 1 of 1 cycle, Start date: --, End date: -- palonosetron (ALOXI) injection 0.25 mg, 0.25 mg, Intravenous,  Once, 23 of 25 cycles Administration: 0.25 mg (07/24/2019), 0.25 mg (08/07/2019), 0.25 mg (08/21/2019), 0.25 mg (09/04/2019), 0.25 mg (09/20/2019), 0.25 mg (10/04/2019), 0.25 mg (10/16/2019), 0.25 mg (10/30/2019), 0.25 mg (11/22/2019), 0.25 mg (12/06/2019), 0.25 mg (12/20/2019), 0.25 mg (01/03/2020), 0.25 mg (01/29/2020), 0.25 mg (02/12/2020), 0.25 mg (02/26/2020), 0.25 mg (03/11/2020), 0.25 mg (03/25/2020), 0.25 mg (04/08/2020), 0.25 mg (04/24/2020), 0.25 mg (05/08/2020), 0.25 mg (05/22/2020), 0.25 mg (06/10/2020), 0.25 mg (06/25/2020) leucovorin 800 mg in dextrose  5 % 250 mL infusion, 844 mg, Intravenous,  Once, 23 of 25 cycles Administration: 800 mg  (07/24/2019), 800 mg (08/07/2019), 800 mg (08/21/2019), 800 mg (09/04/2019), 800 mg (09/20/2019), 800 mg (10/04/2019), 800 mg (10/16/2019), 800 mg (10/30/2019), 800 mg (11/22/2019), 800 mg (12/06/2019), 800 mg (12/20/2019), 800 mg (01/03/2020), 800 mg (01/29/2020), 800 mg (02/12/2020), 800 mg (02/26/2020), 800 mg (03/11/2020), 800 mg (03/25/2020), 800 mg (04/08/2020), 800 mg (04/24/2020), 800 mg (05/08/2020), 800 mg (05/22/2020), 800 mg (06/10/2020), 800 mg (06/25/2020) oxaliplatin (ELOXATIN) 180 mg in dextrose 5 % 500 mL chemo infusion, 85 mg/m2 = 180 mg, Intravenous,  Once, 23 of 25 cycles Dose modification: 178 mg (original dose 85 mg/m2, Cycle 17, Reason: Other (see comments), Comment: insurance adjusted dose ) Administration: 180 mg (07/24/2019), 180 mg (08/07/2019), 180 mg (08/21/2019), 180 mg (09/04/2019), 180 mg (09/20/2019), 180 mg (10/04/2019), 180 mg (10/16/2019), 180 mg (10/30/2019), 180 mg (11/22/2019), 180 mg (12/06/2019), 180 mg (12/20/2019), 180 mg (01/03/2020), 180 mg (01/29/2020), 180 mg (02/12/2020), 180 mg (02/26/2020), 180 mg (03/11/2020), 180 mg (04/08/2020), 180 mg (04/24/2020), 180 mg (05/08/2020), 180 mg (05/22/2020), 180 mg (06/10/2020), 180 mg (06/25/2020) fluorouracil (ADRUCIL) 5,000 mg in sodium chloride 0.9 % 150 mL chemo infusion, 5,050 mg, Intravenous, 1 Day/Dose, 23 of 25 cycles Administration: 5,000 mg (07/24/2019), 5,000 mg (08/07/2019), 5,000 mg (08/21/2019), 5,050 mg (09/04/2019), 5,000 mg (10/04/2019), 5,000 mg (10/16/2019), 5,000 mg (11/22/2019), 5,000 mg (12/06/2019), 5,000 mg (12/20/2019), 5,000 mg (01/03/2020), 5,000 mg (01/29/2020), 5,000 mg (02/12/2020), 5,000 mg (02/26/2020), 5,000 mg (03/11/2020), 5,000 mg (03/25/2020), 5,000 mg (04/08/2020), 5,000 mg (04/24/2020), 5,000 mg (05/08/2020), 5,000 mg (05/22/2020), 5,000 mg (06/10/2020), 5,000 mg (06/25/2020) bevacizumab-bvzr (ZIRABEV) 400 mg in sodium chloride 0.9 % 100 mL chemo infusion, 5 mg/kg = 400 mg, Intravenous,  Once, 23 of 25 cycles Administration: 400 mg (08/07/2019), 400 mg  (08/21/2019), 400 mg (09/04/2019), 400 mg (09/20/2019), 400 mg (10/04/2019), 400 mg (10/16/2019), 400 mg (10/30/2019), 400 mg (11/22/2019), 400 mg (12/06/2019), 400 mg (12/20/2019), 400 mg (01/03/2020), 400 mg (01/29/2020), 400 mg (02/12/2020), 400 mg (02/26/2020), 400 mg (03/11/2020), 400 mg (03/25/2020), 400 mg (04/08/2020), 400 mg (04/24/2020), 400 mg (05/08/2020), 400 mg (05/22/2020), 400 mg (06/10/2020), 400 mg (06/25/2020)   for chemotherapy treatment.     07/08/2020 -  Chemotherapy    Patient is on Treatment Plan: COLORECTAL FOLFIRI / BEVACIZUMAB Q14D       11/05/2020 Cancer Staging   Staging form: Colon and Rectum, AJCC 8th Edition - Clinical: Stage IVC (pM1c) - Signed by Cammie Sickle, MD on 11/05/2020      ALLERGIES:  is allergic to ace inhibitors.  MEDICATIONS:  Current Outpatient Medications  Medication Sig Dispense Refill   amLODipine (NORVASC) 5 MG tablet Take 5 mg by mouth daily.     ondansetron (ZOFRAN) 4 MG tablet Take 1 tablet (4 mg total) by mouth every 8 (eight) hours as needed for nausea or vomiting. (Patient not taking: No sig reported) 20 tablet 0   ondansetron (ZOFRAN) 8 MG tablet Take 1 tablet (8 mg total) by mouth every 8 (eight) hours as needed for nausea or vomiting. (Patient not taking: No sig reported) 20 tablet 0   oxyCODONE (OXY IR/ROXICODONE) 5 MG immediate release tablet Take 1 tablet (5 mg total) by mouth every 6 (six) hours as needed for severe pain. 60 tablet 0   pantoprazole (PROTONIX) 40 MG tablet Take 1 tablet (40 mg total) by mouth 2 (two) times daily. (Patient not taking: No sig reported) 60 tablet 3   pravastatin (  PRAVACHOL) 40 MG tablet Take 40 mg by mouth daily.      prochlorperazine (COMPAZINE) 10 MG tablet Take 1 tablet (10 mg total) by mouth every 6 (six) hours as needed for nausea or vomiting. (Patient not taking: No sig reported) 60 tablet 0   sucralfate (CARAFATE) 1 g tablet TAKE 1 TABLET (1 G TOTAL) BY MOUTH 4 (FOUR) TIMES DAILY - WITH MEALS AND AT  BEDTIME. 90 tablet 3   tamsulosin (FLOMAX) 0.4 MG CAPS capsule Take 1 capsule (0.4 mg total) by mouth daily. 30 capsule 11   trifluridine-tipiracil (LONSURF) 15-6.14 MG tablet Take 5 tablets (75 mg of trifluridine total) by mouth 2 (two) times daily after a meal. 1 hr after AM & PM meals on days 1-5, 8-12. Repeat every 28day (Patient not taking: Reported on 05/12/2021) 100 tablet    No current facility-administered medications for this visit.   Facility-Administered Medications Ordered in Other Visits  Medication Dose Route Frequency Provider Last Rate Last Admin   heparin lock flush 100 unit/mL  500 Units Intravenous Once Cammie Sickle, MD        VITAL SIGNS: There were no vitals taken for this visit. There were no vitals filed for this visit.  Estimated body mass index is 25.04 kg/m as calculated from the following:   Height as of an earlier encounter on 05/12/21: 6' (1.829 m).   Weight as of an earlier encounter on 05/12/21: 83.7 kg (184 lb 9.6 oz).  LABS: CBC:    Component Value Date/Time   WBC 5.3 05/12/2021 0824   HGB 11.6 (L) 05/12/2021 0824   HGB 13.3 09/18/2013 0424   HCT 35.2 (L) 05/12/2021 0824   HCT 38.1 (L) 09/18/2013 0424   PLT 158 05/12/2021 0824   PLT 307 09/18/2013 0424   MCV 98.3 05/12/2021 0824   MCV 94 09/18/2013 0424   NEUTROABS 3.6 05/12/2021 0824   NEUTROABS 17.0 (H) 09/18/2013 0424   LYMPHSABS 0.8 05/12/2021 0824   LYMPHSABS 0.9 (L) 09/18/2013 0424   MONOABS 0.5 05/12/2021 0824   MONOABS 0.4 09/18/2013 0424   EOSABS 0.4 05/12/2021 0824   EOSABS 0.0 09/18/2013 0424   BASOSABS 0.1 05/12/2021 0824   BASOSABS 0.0 09/18/2013 0424   Comprehensive Metabolic Panel:    Component Value Date/Time   NA 137 05/12/2021 0824   NA 137 09/18/2013 0424   K 3.6 05/12/2021 0824   K 4.5 09/18/2013 0424   CL 107 05/12/2021 0824   CL 107 09/18/2013 0424   CO2 24 05/12/2021 0824   CO2 28 09/18/2013 0424   BUN 12 05/12/2021 0824   BUN 19 (H) 09/18/2013 0424    CREATININE 0.86 05/12/2021 0824   CREATININE 1.15 09/18/2013 0424   GLUCOSE 197 (H) 05/12/2021 0824   GLUCOSE 196 (H) 09/18/2013 0424   CALCIUM 8.3 (L) 05/12/2021 0824   CALCIUM 9.1 09/18/2013 0424   AST 36 05/12/2021 0824   ALT 16 05/12/2021 0824   ALKPHOS 208 (H) 05/12/2021 0824   BILITOT 0.9 05/12/2021 0824   PROT 6.7 05/12/2021 0824   ALBUMIN 3.0 (L) 05/12/2021 0824     Present during today's visit: Patient only  Assessment and Plan: Start plan: Patient to start [place] today, took first dose during education visit.   Patient Education I spoke with patient for overview of new oral chemotherapy medication: Lonsurf (trifluridine and tipiracil) for the palliative treatment of metastatic colon adenocarcinoma in conjunction with Zirabev (bevacizumab), planned duration until disease progression or unacceptable drug toxicity.  Administration: Counseled patient on administration, dosing, side effects, monitoring, drug-food interactions, safe handling, storage, and disposal. Patient will take 5 tablets (75 mg of trifluridine total) by mouth 2 (two) times daily after a meal. 1 hr after AM & PM meals on days 1-5, 8-12. Repeat every 28day.  **Waiting for manufacture assistance approval, patient started with samples in clinic today,05/12/2021. Due to pending approval, patients dose is slightly lower, Take 3 tablets (60 mg of trifluridine total) by mouth 2 (two) times daily after a meal. 1 hr after AM & PM meals on days 1-5, 8-12. Repeat every 28day. He will increase to planned dose once approval is obtained.  Side Effects: Side effects include but not limited to: nausea/vomiting, diarrhea, decrease in hemoglobin and white blood cells, and fatigue.    Adherence: After discussion with patient one patient barriers to medication adherence identified. Patient mentioned concern about forgetting his Friday evening dose due to busy schedule, suggested that he set an alarm to remember that specific  dose. Also suggested patient keep his medication and calendar where he typically sits down to eat as a reminder to take his medication. Reviewed with patient importance of keeping a medication schedule and plan for any missed doses.  Mr. Hires voiced understanding and appreciation. All questions answered. Medication handout and calendar provided.  Provided patient with [facility] phone number. Patient knows to call the office with questions or concerns. Oral Chemotherapy Navigation Clinic will continue to follow.  Patient expressed understanding and was in agreement with this plan. He also understands that He can call clinic at any time with any questions, concerns, or complaints.   Medication Access Issues: Manufacture application approval pending, will continue to follow  Follow-up plan:  Will follow up with patient once he receives manufacturer approval with regular dose. Will also follow up with patient in 2 weeks at infusion appointment.  Thank you for allowing me to participate in the care of this patient.   Time Total: 15 minutes  Visit consisted of counseling and education on dealing with issues of symptom management in the setting of serious and potentially life-threatening illness.Greater than 50%  of this time was spent counseling and coordinating care related to the above assessment and plan.  Signed by: Darl Pikes, PharmD, BCPS, Salley Slaughter, CPP Hematology/Oncology Clinical Pharmacist Practitioner [facility]/HP/AP [facility] [phone]  05/12/2021 9:33 AM

## 2021-05-12 NOTE — Telephone Encounter (Signed)
Called [facility] to check the status of patients application.  Rep stated that the case worker will be working on the benefits investigation and contacting the patient regarding his application.  Will follow up in a couple days to check status.  [facility] Patient [place] Phone [phone] Fax [phone] 05/12/2021 10:37 AM

## 2021-05-12 NOTE — Patient Instructions (Signed)
[facility] ONCOLOGY  Discharge Instructions: Thank you for choosing [facility] to provide your oncology and hematology care.  If you have a lab appointment with the [facility], please go directly to the [facility] and check in at the registration area.  Wear comfortable clothing and clothing appropriate for easy access to any Portacath or PICC line.   We strive to give you quality time with your provider. You may need to reschedule your appointment if you arrive late (15 or more minutes).  Arriving late affects you and other patients whose appointments are after yours.  Also, if you miss three or more appointments without notifying the office, you may be dismissed from the clinic at the provider's discretion.      For prescription refill requests, have your pharmacy contact our office and allow 72 hours for refills to be completed.    Today you received the following chemotherapy and/or immunotherapy agents Zirabev       To help prevent nausea and vomiting after your treatment, we encourage you to take your nausea medication as directed.  BELOW ARE SYMPTOMS THAT SHOULD BE REPORTED IMMEDIATELY: *FEVER GREATER THAN 100.4 F (38 C) OR HIGHER *CHILLS OR SWEATING *NAUSEA AND VOMITING THAT IS NOT CONTROLLED WITH YOUR NAUSEA MEDICATION *UNUSUAL SHORTNESS OF BREATH *UNUSUAL BRUISING OR BLEEDING *URINARY PROBLEMS (pain or burning when urinating, or frequent urination) *BOWEL PROBLEMS (unusual diarrhea, constipation, pain near the anus) TENDERNESS IN MOUTH AND THROAT WITH OR WITHOUT PRESENCE OF ULCERS (sore throat, sores in mouth, or a toothache) UNUSUAL RASH, SWELLING OR PAIN  UNUSUAL VAGINAL DISCHARGE OR ITCHING   Items with * indicate a potential emergency and should be followed up as soon as possible or go to the Emergency Department if any problems should occur.  Please show the CHEMOTHERAPY ALERT CARD or IMMUNOTHERAPY ALERT CARD at check-in  to the Emergency Department and triage nurse.  Should you have questions after your visit or need to cancel or reschedule your appointment, please contact [facility]  [phone] and follow the prompts.  Office hours are 8:00 a.m. to 4:30 p.m. Monday - Friday. Please note that voicemails left after 4:00 p.m. may not be returned until the following business day.  We are closed weekends and major holidays. You have access to a nurse at all times for urgent questions. Please call the main number to the clinic [phone] and follow the prompts.  For any non-urgent questions, you may also contact your provider using MyChart. We now offer e-Visits for anyone 25 and older to request care online for non-urgent symptoms. For details visit [email or web address].   Also download the MyChart app! Go to the app store, search "MyChart", open the app, select , and log in with your MyChart username and password.  Due to Covid, a mask is required upon entering the hospital/clinic. If you do not have a mask, one will be given to you upon arrival. For doctor visits, patients may have 1 support person aged 58 or older with them. For treatment visits, patients cannot have anyone with them due to current Covid guidelines and our immunocompromised population.

## 2021-05-13 LAB — CEA: CEA: 38.8 ng/mL — ABNORMAL HIGH (ref 0.0–4.7)

## 2021-05-14 NOTE — Telephone Encounter (Signed)
Called [facility] to check the status of the patients application.  Case worker tried calling patient on 7/26 and had to leave a message.  I called the patient and patients daughter answered, I gave her the phone number to [facility] and she will have her father call them.  [facility] Patient [place] Phone [phone] Fax [phone] 05/14/2021 11:42 AM

## 2021-05-15 NOTE — Telephone Encounter (Signed)
Called to check status of application today and Oscar [name] has requested the patient send in his tax return to prove income eligibility.  I called and spoke to the patient and he will have his daughter fax over the info to them today or tomorrow morning.  [facility] Patient [place] Phone [phone] Fax [phone] 05/15/2021 3:43 PM

## 2021-05-16 NOTE — Addendum Note (Signed)
Addended by: Cammie Sickle on: 05/16/2021 07:39 PM   Modules accepted: Orders

## 2021-05-21 ENCOUNTER — Telehealth: Payer: Self-pay | Admitting: Pharmacy Technician

## 2021-05-21 NOTE — Telephone Encounter (Signed)
Oral Oncology Patient Advocate Encounter   Was successful in securing patient a $5,000 grant from [place] to provide copayment coverage for Lonsurf.  This will keep the out of pocket expense at $0.     I will let the patient know of the approval.    The billing information is as follows and has been shared with [facility].   Member ID: [id number] Group ID: [id number] RxBin: [id number] PCN: [id number] Dates of Eligibility: 05/19/21 through 05/19/22  Fund name:  [facility] Patient [place] Phone [phone] Fax [phone] 05/21/2021 10:27 AM

## 2021-05-21 NOTE — Telephone Encounter (Signed)
Patient stopped by on 05/20/21 to drop off financial documents.  I faxed the documents to [facility] [phone]).  [facility] Patient [place] Phone [phone] Fax [phone] 05/21/2021 10:14 AM

## 2021-05-26 ENCOUNTER — Inpatient Hospital Stay: Payer: Medicare HMO | Admitting: Internal Medicine

## 2021-05-26 ENCOUNTER — Other Ambulatory Visit: Payer: Self-pay

## 2021-05-26 ENCOUNTER — Inpatient Hospital Stay: Payer: Medicare HMO | Attending: Hospice and Palliative Medicine

## 2021-05-26 ENCOUNTER — Encounter: Payer: Self-pay | Admitting: Internal Medicine

## 2021-05-26 ENCOUNTER — Inpatient Hospital Stay: Payer: Medicare HMO | Admitting: Pharmacist

## 2021-05-26 ENCOUNTER — Inpatient Hospital Stay: Payer: Medicare HMO

## 2021-05-26 DIAGNOSIS — C182 Malignant neoplasm of ascending colon: Secondary | ICD-10-CM

## 2021-05-26 DIAGNOSIS — C787 Secondary malignant neoplasm of liver and intrahepatic bile duct: Secondary | ICD-10-CM

## 2021-05-26 DIAGNOSIS — C189 Malignant neoplasm of colon, unspecified: Secondary | ICD-10-CM

## 2021-05-26 DIAGNOSIS — Z7189 Other specified counseling: Secondary | ICD-10-CM

## 2021-05-26 DIAGNOSIS — Z5112 Encounter for antineoplastic immunotherapy: Secondary | ICD-10-CM | POA: Diagnosis not present

## 2021-05-26 DIAGNOSIS — Z95828 Presence of other vascular implants and grafts: Secondary | ICD-10-CM

## 2021-05-26 LAB — URINALYSIS, COMPLETE (UACMP) WITH MICROSCOPIC
Bilirubin Urine: NEGATIVE
Glucose, UA: NEGATIVE mg/dL
Hgb urine dipstick: NEGATIVE
Ketones, ur: NEGATIVE mg/dL
Leukocytes,Ua: NEGATIVE
Nitrite: NEGATIVE
Protein, ur: NEGATIVE mg/dL
Specific Gravity, Urine: 1.026 (ref 1.005–1.030)
pH: 5 (ref 5.0–8.0)

## 2021-05-26 LAB — COMPREHENSIVE METABOLIC PANEL
ALT: 15 U/L (ref 0–44)
AST: 35 U/L (ref 15–41)
Albumin: 3.1 g/dL — ABNORMAL LOW (ref 3.5–5.0)
Alkaline Phosphatase: 193 U/L — ABNORMAL HIGH (ref 38–126)
Anion gap: 7 (ref 5–15)
BUN: 15 mg/dL (ref 8–23)
CO2: 24 mmol/L (ref 22–32)
Calcium: 8.2 mg/dL — ABNORMAL LOW (ref 8.9–10.3)
Chloride: 105 mmol/L (ref 98–111)
Creatinine, Ser: 0.81 mg/dL (ref 0.61–1.24)
GFR, Estimated: >60 mL/min (ref >60)
Glucose, Bld: 181 mg/dL — ABNORMAL HIGH (ref 70–99)
Potassium: 3.6 mmol/L (ref 3.5–5.1)
Sodium: 136 mmol/L (ref 135–145)
Total Bilirubin: 1 mg/dL (ref 0.3–1.2)
Total Protein: 7.1 g/dL (ref 6.5–8.1)

## 2021-05-26 LAB — PROTEIN / CREATININE RATIO, URINE
Creatinine, Urine: 258 mg/dL
Protein Creatinine Ratio: 0.05 mg/mg{Cre} (ref 0.00–0.15)
Total Protein, Urine: 14 mg/dL

## 2021-05-26 LAB — CBC WITH DIFFERENTIAL/PLATELET
Abs Immature Granulocytes: 0.01 10*3/uL (ref 0.00–0.07)
Basophils Absolute: 0 10*3/uL (ref 0.0–0.1)
Basophils Relative: 1 %
Eosinophils Absolute: 0.1 10*3/uL (ref 0.0–0.5)
Eosinophils Relative: 4 %
HCT: 33.2 % — ABNORMAL LOW (ref 39.0–52.0)
Hemoglobin: 10.9 g/dL — ABNORMAL LOW (ref 13.0–17.0)
Immature Granulocytes: 0 %
Lymphocytes Relative: 19 %
Lymphs Abs: 0.5 10*3/uL — ABNORMAL LOW (ref 0.7–4.0)
MCH: 31.6 pg (ref 26.0–34.0)
MCHC: 32.8 g/dL (ref 30.0–36.0)
MCV: 96.2 fL (ref 80.0–100.0)
Monocytes Absolute: 0.1 10*3/uL (ref 0.1–1.0)
Monocytes Relative: 3 %
Neutro Abs: 2 10*3/uL (ref 1.7–7.7)
Neutrophils Relative %: 73 %
Platelets: 108 10*3/uL — ABNORMAL LOW (ref 150–400)
RBC: 3.45 MIL/uL — ABNORMAL LOW (ref 4.22–5.81)
RDW: 13.4 % (ref 11.5–15.5)
WBC: 2.7 10*3/uL — ABNORMAL LOW (ref 4.0–10.5)
nRBC: 0 % (ref 0.0–0.2)

## 2021-05-26 MED ORDER — SODIUM CHLORIDE 0.9% FLUSH
10.0000 mL | Freq: Once | INTRAVENOUS | Status: AC
  Administered 2021-05-26: 10 mL via INTRAVENOUS
  Filled 2021-05-26: qty 10

## 2021-05-26 MED ORDER — HEPARIN SOD (PORK) LOCK FLUSH 100 UNIT/ML IV SOLN
500.0000 [IU] | Freq: Once | INTRAVENOUS | Status: DC
  Administered 2021-05-26: 500 [IU] via INTRAVENOUS
  Filled 2021-05-26: qty 5

## 2021-05-26 MED ORDER — HEPARIN SOD (PORK) LOCK FLUSH 100 UNIT/ML IV SOLN
INTRAVENOUS | Status: AC
  Filled 2021-05-26: qty 5

## 2021-05-26 MED ORDER — SODIUM CHLORIDE 0.9 % IV SOLN
5.0000 mg/kg | Freq: Once | INTRAVENOUS | Status: AC
  Administered 2021-05-26: 400 mg via INTRAVENOUS
  Filled 2021-05-26: qty 16

## 2021-05-26 MED ORDER — SODIUM CHLORIDE 0.9 % IV SOLN
Freq: Once | INTRAVENOUS | Status: AC
  Filled 2021-05-26: qty 250

## 2021-05-26 NOTE — Telephone Encounter (Addendum)
Oral Oncology Patient Advocate Encounter  Received notification from [facility] Patient Assistance program that patient has been successfully enrolled into their program to receive Lonsurf from the manufacturer at $0 out of pocket until 10/18/21.    Specialty Pharmacy that will dispense medication is [facility].  Patient knows to call the office with questions or concerns.   Oral Oncology Clinic will continue to follow.  [facility] Patient [place] Phone [phone] Fax [phone] 05/26/2021 8:46 AM

## 2021-05-26 NOTE — Progress Notes (Signed)
[facility] NOTE  Patient Care Team: Sofie Hartigan, MD as PCP - General (Family Medicine) Cammie Sickle, MD as Consulting Physician (Hematology and Oncology)  CHIEF COMPLAINTS/PURPOSE OF CONSULTATION: Colon cancer  #  Oncology History Overview Note  # MAY 2020- 3. 03/09/19 Liver biopsy. Microscopic examination shows malignant cells with glandular architecture consistent with adenocarcinoma. The malignant cells are positive for CK20 and CDX-2. These findings support the clinical impression of metastatic colon adenocarcinoma. 4. 03/10/19 R hemicolectomy. Tumor site cecum. Adenocarcinoma. Mucinous features present. G2. No tumor deposits. Invades visceral peritoneum. No tumor perforation. LVI present. PNI not identified. All margins uninvolved. 1/12 LNs. PT4apN1. Periappendiceal inflammation c/w resolving abscess. Microsatellite stable (MSS). [Dr.Mettu; [facility]]  # SEP 4th 2020 [compared to May 2020]  Interval increase in size of the metastases to the hepatic dome, The metastasis to the left hepatic lobe is unchanged; 2.  New subcentimeter hypoattenuating lesion in the inferior right hepatic lobe, incompletely characterized on CT. 3.  Postsurgical changes following right hemicolectomy.  # OCT 2020- FOLFOX +avastin; CT dec 22nd 2020- [compared to [facility] sep 9th 2020]-Liver- slight progression versus stable disease; CT scan SEP 4th 2021- Progressive disease-left lower lobe lung nodule 8 mm [previously 4 mm]; increase in size of the hepatic metastases by few millimeters.;  Soft tissue nodule adjacent to anastomotic site again increased by few millimeters.STOP FOLFOX; cont avastin  #  SEP 20th, 2021- FOLFIRI+ AVASTIN; HOLD FEB 2022- sec to poor tolerance [peptic ulcer]; 3/30-liver ablation-   [bland embolization of segment 2/3 liver lesion on 12/19/2020;  microwave ablation of both liver lesions.  # July 25th, 2022-   # FEB /16-EGD [Dr.Anna-duodenal ulcer-? meloxicam]  #  JAN 2022- COVID [s/p Mab infusion; pills; skin rash resolved.]  # NGS/F-ONE-MUTATED K-RAS [G]  # PALLIATIVE CARE EVALUATION: 09/20/2019-[name]  # PAIN MANAGEMENT: NA   DIAGNOSIS: COLON CANCER  STAGE:  IV     ;  GOALS:Palliative  CURRENT/MOST RECENT THERAPY : FOLFIRI+ avastin [C]    Cancer of right colon ([place])  07/05/2019 Initial Diagnosis   Cancer of right colon ([place])   07/24/2019 - 06/25/2020 Chemotherapy   The patient had dexamethasone (DECADRON) 4 MG tablet, 8 mg, Oral, Daily, 1 of 1 cycle, Start date: --, End date: -- palonosetron (ALOXI) injection 0.25 mg, 0.25 mg, Intravenous,  Once, 23 of 25 cycles Administration: 0.25 mg (07/24/2019), 0.25 mg (08/07/2019), 0.25 mg (08/21/2019), 0.25 mg (09/04/2019), 0.25 mg (09/20/2019), 0.25 mg (10/04/2019), 0.25 mg (10/16/2019), 0.25 mg (10/30/2019), 0.25 mg (11/22/2019), 0.25 mg (12/06/2019), 0.25 mg (12/20/2019), 0.25 mg (01/03/2020), 0.25 mg (01/29/2020), 0.25 mg (02/12/2020), 0.25 mg (02/26/2020), 0.25 mg (03/11/2020), 0.25 mg (03/25/2020), 0.25 mg (04/08/2020), 0.25 mg (04/24/2020), 0.25 mg (05/08/2020), 0.25 mg (05/22/2020), 0.25 mg (06/10/2020), 0.25 mg (06/25/2020) leucovorin 800 mg in dextrose 5 % 250 mL infusion, 844 mg, Intravenous,  Once, 23 of 25 cycles Administration: 800 mg (07/24/2019), 800 mg (08/07/2019), 800 mg (08/21/2019), 800 mg (09/04/2019), 800 mg (09/20/2019), 800 mg (10/04/2019), 800 mg (10/16/2019), 800 mg (10/30/2019), 800 mg (11/22/2019), 800 mg (12/06/2019), 800 mg (12/20/2019), 800 mg (01/03/2020), 800 mg (01/29/2020), 800 mg (02/12/2020), 800 mg (02/26/2020), 800 mg (03/11/2020), 800 mg (03/25/2020), 800 mg (04/08/2020), 800 mg (04/24/2020), 800 mg (05/08/2020), 800 mg (05/22/2020), 800 mg (06/10/2020), 800 mg (06/25/2020) oxaliplatin (ELOXATIN) 180 mg in dextrose 5 % 500 mL chemo infusion, 85 mg/m2 = 180 mg, Intravenous,  Once, 23 of 25 cycles Dose modification: 178 mg (original dose 85 mg/m2, Cycle 17, Reason:  Other (see comments), Comment: insurance adjusted dose  ) Administration: 180 mg (07/24/2019), 180 mg (08/07/2019), 180 mg (08/21/2019), 180 mg (09/04/2019), 180 mg (09/20/2019), 180 mg (10/04/2019), 180 mg (10/16/2019), 180 mg (10/30/2019), 180 mg (11/22/2019), 180 mg (12/06/2019), 180 mg (12/20/2019), 180 mg (01/03/2020), 180 mg (01/29/2020), 180 mg (02/12/2020), 180 mg (02/26/2020), 180 mg (03/11/2020), 180 mg (04/08/2020), 180 mg (04/24/2020), 180 mg (05/08/2020), 180 mg (05/22/2020), 180 mg (06/10/2020), 180 mg (06/25/2020) fluorouracil (ADRUCIL) 5,000 mg in sodium chloride 0.9 % 150 mL chemo infusion, 5,050 mg, Intravenous, 1 Day/Dose, 23 of 25 cycles Administration: 5,000 mg (07/24/2019), 5,000 mg (08/07/2019), 5,000 mg (08/21/2019), 5,050 mg (09/04/2019), 5,000 mg (10/04/2019), 5,000 mg (10/16/2019), 5,000 mg (11/22/2019), 5,000 mg (12/06/2019), 5,000 mg (12/20/2019), 5,000 mg (01/03/2020), 5,000 mg (01/29/2020), 5,000 mg (02/12/2020), 5,000 mg (02/26/2020), 5,000 mg (03/11/2020), 5,000 mg (03/25/2020), 5,000 mg (04/08/2020), 5,000 mg (04/24/2020), 5,000 mg (05/08/2020), 5,000 mg (05/22/2020), 5,000 mg (06/10/2020), 5,000 mg (06/25/2020) bevacizumab-bvzr (ZIRABEV) 400 mg in sodium chloride 0.9 % 100 mL chemo infusion, 5 mg/kg = 400 mg, Intravenous,  Once, 23 of 25 cycles Administration: 400 mg (08/07/2019), 400 mg (08/21/2019), 400 mg (09/04/2019), 400 mg (09/20/2019), 400 mg (10/04/2019), 400 mg (10/16/2019), 400 mg (10/30/2019), 400 mg (11/22/2019), 400 mg (12/06/2019), 400 mg (12/20/2019), 400 mg (01/03/2020), 400 mg (01/29/2020), 400 mg (02/12/2020), 400 mg (02/26/2020), 400 mg (03/11/2020), 400 mg (03/25/2020), 400 mg (04/08/2020), 400 mg (04/24/2020), 400 mg (05/08/2020), 400 mg (05/22/2020), 400 mg (06/10/2020), 400 mg (06/25/2020)   for chemotherapy treatment.     07/08/2020 -  Chemotherapy    Patient is on Treatment Plan: COLORECTAL FOLFIRI / BEVACIZUMAB Q14D       11/05/2020 Cancer Staging   Staging form: Colon and Rectum, AJCC 8th Edition - Clinical: Stage IVC (pM1c) - Signed by Cammie Sickle, MD  on 11/05/2020      HISTORY OF PRESENTING ILLNESS:  Oscar [name] 80 y.o.  male with metastatic colon cancer to the liver most recently noted to have progressive disease in the liver on MRI -currently on bevacizumab plus Lonsurf.  Patient denies any significant nausea vomiting.  No fevers or chills.  No chest pain or shortness of breath or cough.  No abdominal pain.  No diarrhea.   Review of Systems  Constitutional:  Positive for malaise/fatigue. Negative for chills, diaphoresis and fever.  HENT:  Negative for nosebleeds and sore throat.   Eyes:  Negative for double vision.  Respiratory:  Negative for cough, hemoptysis, sputum production, shortness of breath and wheezing.   Cardiovascular:  Negative for chest pain, palpitations, orthopnea and leg swelling.  Gastrointestinal:  Negative for blood in stool, constipation, heartburn, melena, nausea and vomiting.  Genitourinary:  Negative for dysuria, frequency and urgency.  Musculoskeletal:  Positive for back pain and joint pain.  Skin: Negative.  Negative for itching and rash.  Neurological:  Positive for tingling. Negative for focal weakness, weakness and headaches.  Endo/Heme/Allergies:  Does not bruise/bleed easily.  Psychiatric/Behavioral:  Negative for depression. The patient is not nervous/anxious and does not have insomnia.     MEDICAL HISTORY:  Past Medical History:  Diagnosis Date   Arthritis    Cancer ([place])    colon cancer 02/2019 per pt    Family history of adverse reaction to anesthesia    cousin took all night to wake up from anesthesia   H/O colon cancer, stage IV    Hyperlipemia    Neuromuscular disorder ([place])    Pre-diabetes     SURGICAL  HISTORY: Past Surgical History:  Procedure Laterality Date   COLON SURGERY     ESOPHAGOGASTRODUODENOSCOPY (EGD) WITH PROPOFOL N/A 12/26/2020   Procedure: ESOPHAGOGASTRODUODENOSCOPY (EGD) WITH PROPOFOL;  Surgeon: [name], MD;  Location: [facility] ENDOSCOPY;  Service:  Gastroenterology;  Laterality: N/A;   IR ANGIOGRAM SELECTIVE EACH ADDITIONAL VESSEL  12/19/2020   IR ANGIOGRAM SELECTIVE EACH ADDITIONAL VESSEL  03/24/2021   IR ANGIOGRAM SELECTIVE EACH ADDITIONAL VESSEL  03/24/2021   IR ANGIOGRAM SELECTIVE EACH ADDITIONAL VESSEL  04/04/2021   IR ANGIOGRAM SELECTIVE EACH ADDITIONAL VESSEL  04/04/2021   IR ANGIOGRAM SELECTIVE EACH ADDITIONAL VESSEL  04/04/2021   IR ANGIOGRAM VISCERAL SELECTIVE  12/19/2020   IR ANGIOGRAM VISCERAL SELECTIVE  03/24/2021   IR ANGIOGRAM VISCERAL SELECTIVE  04/04/2021   IR ANGIOGRAM VISCERAL SELECTIVE  04/04/2021   IR EMBO ARTERIAL NOT HEMORR HEMANG INC GUIDE ROADMAPPING  03/24/2021   IR EMBO TUMOR ORGAN ISCHEMIA INFARCT INC GUIDE ROADMAPPING  12/19/2020   IR EMBO TUMOR ORGAN ISCHEMIA INFARCT INC GUIDE ROADMAPPING  04/04/2021   IR IMAGING GUIDED PORT INSERTION  07/20/2019   IR RADIOLOGIST EVAL & MGMT  12/05/2020   IR RADIOLOGIST EVAL & MGMT  02/11/2021   IR RADIOLOGIST EVAL & MGMT  03/04/2021   IR RADIOLOGIST EVAL & MGMT  04/29/2021   IR US GUIDE VASC ACCESS RIGHT  12/19/2020   IR US GUIDE VASC ACCESS RIGHT  03/24/2021   IR US GUIDE VASC ACCESS RIGHT  04/04/2021   JOINT REPLACEMENT Left 2010   Knee   RADIOLOGY WITH ANESTHESIA N/A 01/15/2021   Procedure: CT WITH ANESTHESIA MICROWAVE ABLATION OF LIVER;  Surgeon: Criselda Peaches, MD;  Location: [facility] ORS;  Service: Anesthesiology;  Laterality: N/A;    SOCIAL HISTORY: Social History   Socioeconomic History   Marital status: Married    Spouse name: Not on file   Number of children: Not on file   Years of education: Not on file   Highest education level: Not on file  Occupational History   Not on file  Tobacco Use   Smoking status: Every Day    Packs/day: 0.25    Years: 15.00    Pack years: 3.75    Types: Cigarettes   Smokeless tobacco: Never   Tobacco comments:    1 to 2 cigarettes a day occasionally  Vaping Use   Vaping Use: Never used  Substance and Sexual Activity   Alcohol use: Yes     Comment: rarely    Drug use: No   Sexual activity: Not on file  Other Topics Concern   Not on file  Social History Narrative   Recruitment consultant retd; lives in [place]; smoking 3cig/day; [3/4 ppd x started at 9 years]; no alcohol. Son & daughter; wife dementia [waiting for placement].    Social Determinants of Health   Financial Resource Strain: Not on file  Food Insecurity: Not on file  Transportation Needs: Not on file  Physical Activity: Not on file  Stress: Not on file  Social Connections: Not on file  Intimate Partner Violence: Not on file    FAMILY HISTORY: Family History  Problem Relation Age of Onset   Peptic Ulcer Disease Father     ALLERGIES:  is allergic to ace inhibitors.  MEDICATIONS:  Current Outpatient Medications  Medication Sig Dispense Refill   amLODipine (NORVASC) 5 MG tablet Take 5 mg by mouth daily.     oxyCODONE (OXY IR/ROXICODONE) 5 MG immediate release tablet Take 1 tablet (5 mg total) by  mouth every 6 (six) hours as needed for severe pain. 60 tablet 0   pravastatin (PRAVACHOL) 40 MG tablet Take 40 mg by mouth daily.      sucralfate (CARAFATE) 1 g tablet TAKE 1 TABLET (1 G TOTAL) BY MOUTH 4 (FOUR) TIMES DAILY - WITH MEALS AND AT BEDTIME. 90 tablet 3   tamsulosin (FLOMAX) 0.4 MG CAPS capsule Take 1 capsule (0.4 mg total) by mouth daily. 30 capsule 11   trifluridine-tipiracil (LONSURF) 15-6.14 MG tablet Take 5 tablets (75 mg of trifluridine total) by mouth 2 (two) times daily after a meal. 1 hr after AM & PM meals on days 1-5, 8-12. Repeat every 28day 100 tablet    ondansetron (ZOFRAN) 4 MG tablet Take 1 tablet (4 mg total) by mouth every 8 (eight) hours as needed for nausea or vomiting. (Patient not taking: No sig reported) 20 tablet 0   ondansetron (ZOFRAN) 8 MG tablet Take 1 tablet (8 mg total) by mouth every 8 (eight) hours as needed for nausea or vomiting. (Patient not taking: No sig reported) 20 tablet 0   pantoprazole (PROTONIX) 40 MG  tablet Take 1 tablet (40 mg total) by mouth 2 (two) times daily. (Patient not taking: No sig reported) 60 tablet 3   prochlorperazine (COMPAZINE) 10 MG tablet Take 1 tablet (10 mg total) by mouth every 6 (six) hours as needed for nausea or vomiting. (Patient not taking: No sig reported) 60 tablet 0   No current facility-administered medications for this visit.      Marland Kitchen  PHYSICAL EXAMINATION: ECOG PERFORMANCE STATUS: 0 - Asymptomatic  Vitals:   05/26/21 0855  BP: 132/71  Pulse: 66  Resp: 16  Temp: 98 F (36.7 C)  SpO2: 99%   Filed Weights   05/26/21 0855  Weight: 182 lb (82.6 kg)    Physical Exam HENT:     Head: Normocephalic and atraumatic.     Mouth/Throat:     Pharynx: No oropharyngeal exudate.     Comments: Whitish patches noted right side.  Eyes:     Pupils: Pupils are equal, round, and reactive to light.  Cardiovascular:     Rate and Rhythm: Normal rate and regular rhythm.  Pulmonary:     Effort: No respiratory distress.     Breath sounds: No wheezing.     Comments: Decreased breath sounds bilaterally.  No wheeze or crackles Abdominal:     General: Bowel sounds are normal. There is no distension.     Palpations: Abdomen is soft. There is no mass.     Tenderness: There is no abdominal tenderness. There is no guarding or rebound.  Musculoskeletal:        General: No tenderness. Normal range of motion.     Cervical back: Normal range of motion and neck supple.  Skin:    General: Skin is warm.  Neurological:     Mental Status: He is alert and oriented to person, place, and time.  Psychiatric:        Mood and Affect: Affect normal.   LABORATORY DATA:  I have reviewed the data as listed Lab Results  Component Value Date   WBC 2.7 (L) 05/26/2021   HGB 10.9 (L) 05/26/2021   HCT 33.2 (L) 05/26/2021   MCV 96.2 05/26/2021   PLT 108 (L) 05/26/2021   Recent Labs    06/25/20 0841 07/08/20 0843 07/22/20 0836 08/05/20 0821 04/28/21 1019 05/12/21 0824  05/26/21 0822  NA 138 139 138   < > 138  137 136  K 3.6 3.6 3.6   < > 3.8 3.6 3.6  CL 108 108 107   < > 107 107 105  CO2 $Re'24 25 25   'ept$ < > $R'24 24 24  'fI$ GLUCOSE 201* 209* 195*   < > 207* 197* 181*  BUN $Re'10 9 10   'Yaf$ < > $R'12 12 15  'fR$ CREATININE 0.80 0.70 0.89   < > 0.83 0.86 0.81  CALCIUM 8.0* 8.0* 8.1*   < > 8.6* 8.3* 8.2*  GFRNONAA >60 >60 >60   < > >60 >60 >60  GFRAA >60 >60 >60  --   --   --   --   PROT 6.5 6.5 6.7   < > 6.8 6.7 7.1  ALBUMIN 3.2* 3.1* 3.1*   < > 3.0* 3.0* 3.1*  AST 32 31 29   < > 36 36 35  ALT $Re'20 19 19   'QUZ$ < > $R'22 16 15  'qY$ ALKPHOS 121 102 108   < > 232* 208* 193*  BILITOT 0.8 0.7 0.8   < > 0.9 0.9 1.0   < > = values in this interval not displayed.    RADIOGRAPHIC STUDIES: I have personally reviewed the radiological images as listed and agreed with the findings in the report. IR Radiologist Eval & Mgmt  Result Date: 04/29/2021 Please refer to notes tab for details about interventional procedure. (Op Note)    ASSESSMENT & PLAN:   Cancer of right colon ([place]) #Right-sided colon adenocarcinoma-with synchronous metastasis to liver/unresectable.  Most recently s/p  FOLFIRI plus Avastin on HOLD-given local therapies to liver. s/p Ablation- MAY 2022 MRI- Multiple lvier lesions/progression-patient underwent s/p Y 90 on 06/17; awaiting repeat MRI in late sep, 2022 [IR].  Currently on oral TAS-102 (35 mg/m2 twice daily on days 1-5 and 8-12 every 28 days].   # proceed with cycle #1 day-15- Zirabev. Labs today reviewed;  acceptable for treatment today; except UA- 100 protein.   # UA protein: 100- check Urine protein ratio today;   # PN- G-1-2; sec to oxaliplatin-STABLE.   # Mediport- functioning- STABLE.   # Shoulder pain-refill oxycodone prn [place] ]- STABLE.   # DISPOSITION:   # zirabev today.  # follow up in 2weeks-- MD; labs- cbc/cmp CEA/UA; zirabev-Dr.B   All questions were answered. The patient knows to call the clinic with any problems, questions or concerns.    Cammie Sickle, MD 05/26/2021 10:13 PM

## 2021-05-26 NOTE — Progress Notes (Signed)
Received a secure chat message from Nira Conn that per Dr Rogue Bussing it is ok to proceed with today's treatment and use UA form 7/25.   If pt does void while here I am to send the urine.

## 2021-05-26 NOTE — Progress Notes (Signed)
[name]  [phone] [phone]  Patient Care Team: Sofie Hartigan, MD as PCP - General (Family Medicine) Cammie Sickle, MD as Consulting Physician (Hematology and Oncology)   Name of the patient: Oscar [name]  [id number]  [birth year]   Date of visit: 05/26/21  HPI: Patient is a 80 y.o. male with metastatic colon adenocarcinoma. Planned treatment with Lonsurf and Zirabev (bevacizumab). Patient to start treatment today, 05/12/2021.   Reason for Consult: Oral chemotherapy follow-up for Lonsurf (trifluridine-tipiracil) therapy.   PAST MEDICAL HISTORY: Past Medical History:  Diagnosis Date   Arthritis    Cancer ([place])    colon cancer 02/2019 per pt    Family history of adverse reaction to anesthesia    cousin took all night to wake up from anesthesia   H/O colon cancer, stage IV    Hyperlipemia    Neuromuscular disorder [facility])    Pre-diabetes     HEMATOLOGY/ONCOLOGY HISTORY:  Oncology History Overview Note  # MAY 2020- 3. 03/09/19 Liver biopsy. Microscopic examination shows malignant cells with glandular architecture consistent with adenocarcinoma. The malignant cells are positive for CK20 and CDX-2. These findings support the clinical impression of metastatic colon adenocarcinoma. 4. 03/10/19 R hemicolectomy. Tumor site cecum. Adenocarcinoma. Mucinous features present. G2. No tumor deposits. Invades visceral peritoneum. No tumor perforation. LVI present. PNI not identified. All margins uninvolved. 1/12 LNs. PT4apN1. Periappendiceal inflammation c/w resolving abscess. Microsatellite stable (MSS). [Dr.Mettu; [facility]]  # SEP 4th 2020 [compared to May 2020]  Interval increase in size of the metastases to the hepatic dome, The metastasis to the left hepatic lobe is unchanged; 2.  New subcentimeter hypoattenuating lesion in the inferior right hepatic lobe, incompletely characterized on CT. 3.  Postsurgical  changes following right hemicolectomy.  # OCT 2020- FOLFOX +avastin; CT dec 22nd 2020- [compared to [facility] sep 9th 2020]-Liver- slight progression versus stable disease; CT scan SEP 4th 2021- Progressive disease-left lower lobe lung nodule 8 mm [previously 4 mm]; increase in size of the hepatic metastases by few millimeters.;  Soft tissue nodule adjacent to anastomotic site again increased by few millimeters.STOP FOLFOX; cont avastin  #  SEP 20th, 2021- FOLFIRI+ AVASTIN; HOLD FEB 2022- sec to poor toelrance [peptic ulcer]; 3/30-liver ablation-   [bland embolization of segment 2/3 liver lesion on 12/19/2020;  microwave ablation of both liver lesions.  # FEB /16-EGD [Dr.Anna-duodenal ulcer-? meloxicam]  # JAN 2022- COVID [s/p Mab infusion; pills; skin rash resolved.]  # NGS/F-ONE-MUTATED K-RAS [G]  # PALLIATIVE CARE EVALUATION: 09/20/2019-[name]  # PAIN MANAGEMENT: NA   DIAGNOSIS: COLON CANCER  STAGE:  IV     ;  GOALS:Palliative  CURRENT/MOST RECENT THERAPY : FOLFIRI+ avastin [C]    Cancer of right colon ([place])  07/05/2019 Initial Diagnosis   Cancer of right colon ([place])   07/24/2019 - 06/25/2020 Chemotherapy   The patient had dexamethasone (DECADRON) 4 MG tablet, 8 mg, Oral, Daily, 1 of 1 cycle, Start date: --, End date: -- palonosetron (ALOXI) injection 0.25 mg, 0.25 mg, Intravenous,  Once, 23 of 25 cycles Administration: 0.25 mg (07/24/2019), 0.25 mg (08/07/2019), 0.25 mg (08/21/2019), 0.25 mg (09/04/2019), 0.25 mg (09/20/2019), 0.25 mg (10/04/2019), 0.25 mg (10/16/2019), 0.25 mg (10/30/2019), 0.25 mg (11/22/2019), 0.25 mg (12/06/2019), 0.25 mg (12/20/2019), 0.25 mg (01/03/2020), 0.25 mg (01/29/2020), 0.25 mg (02/12/2020), 0.25 mg (02/26/2020), 0.25 mg (03/11/2020), 0.25 mg (03/25/2020), 0.25 mg (04/08/2020), 0.25 mg (04/24/2020), 0.25 mg (05/08/2020), 0.25 mg (05/22/2020), 0.25 mg (06/10/2020), 0.25 mg (06/25/2020) leucovorin  800 mg in dextrose 5 % 250 mL infusion, 844 mg, Intravenous,  Once, 23 of 25  cycles Administration: 800 mg (07/24/2019), 800 mg (08/07/2019), 800 mg (08/21/2019), 800 mg (09/04/2019), 800 mg (09/20/2019), 800 mg (10/04/2019), 800 mg (10/16/2019), 800 mg (10/30/2019), 800 mg (11/22/2019), 800 mg (12/06/2019), 800 mg (12/20/2019), 800 mg (01/03/2020), 800 mg (01/29/2020), 800 mg (02/12/2020), 800 mg (02/26/2020), 800 mg (03/11/2020), 800 mg (03/25/2020), 800 mg (04/08/2020), 800 mg (04/24/2020), 800 mg (05/08/2020), 800 mg (05/22/2020), 800 mg (06/10/2020), 800 mg (06/25/2020) oxaliplatin (ELOXATIN) 180 mg in dextrose 5 % 500 mL chemo infusion, 85 mg/m2 = 180 mg, Intravenous,  Once, 23 of 25 cycles Dose modification: 178 mg (original dose 85 mg/m2, Cycle 17, Reason: Other (see comments), Comment: insurance adjusted dose ) Administration: 180 mg (07/24/2019), 180 mg (08/07/2019), 180 mg (08/21/2019), 180 mg (09/04/2019), 180 mg (09/20/2019), 180 mg (10/04/2019), 180 mg (10/16/2019), 180 mg (10/30/2019), 180 mg (11/22/2019), 180 mg (12/06/2019), 180 mg (12/20/2019), 180 mg (01/03/2020), 180 mg (01/29/2020), 180 mg (02/12/2020), 180 mg (02/26/2020), 180 mg (03/11/2020), 180 mg (04/08/2020), 180 mg (04/24/2020), 180 mg (05/08/2020), 180 mg (05/22/2020), 180 mg (06/10/2020), 180 mg (06/25/2020) fluorouracil (ADRUCIL) 5,000 mg in sodium chloride 0.9 % 150 mL chemo infusion, 5,050 mg, Intravenous, 1 Day/Dose, 23 of 25 cycles Administration: 5,000 mg (07/24/2019), 5,000 mg (08/07/2019), 5,000 mg (08/21/2019), 5,050 mg (09/04/2019), 5,000 mg (10/04/2019), 5,000 mg (10/16/2019), 5,000 mg (11/22/2019), 5,000 mg (12/06/2019), 5,000 mg (12/20/2019), 5,000 mg (01/03/2020), 5,000 mg (01/29/2020), 5,000 mg (02/12/2020), 5,000 mg (02/26/2020), 5,000 mg (03/11/2020), 5,000 mg (03/25/2020), 5,000 mg (04/08/2020), 5,000 mg (04/24/2020), 5,000 mg (05/08/2020), 5,000 mg (05/22/2020), 5,000 mg (06/10/2020), 5,000 mg (06/25/2020) bevacizumab-bvzr (ZIRABEV) 400 mg in sodium chloride 0.9 % 100 mL chemo infusion, 5 mg/kg = 400 mg, Intravenous,  Once, 23 of 25 cycles Administration:  400 mg (08/07/2019), 400 mg (08/21/2019), 400 mg (09/04/2019), 400 mg (09/20/2019), 400 mg (10/04/2019), 400 mg (10/16/2019), 400 mg (10/30/2019), 400 mg (11/22/2019), 400 mg (12/06/2019), 400 mg (12/20/2019), 400 mg (01/03/2020), 400 mg (01/29/2020), 400 mg (02/12/2020), 400 mg (02/26/2020), 400 mg (03/11/2020), 400 mg (03/25/2020), 400 mg (04/08/2020), 400 mg (04/24/2020), 400 mg (05/08/2020), 400 mg (05/22/2020), 400 mg (06/10/2020), 400 mg (06/25/2020)   for chemotherapy treatment.     07/08/2020 -  Chemotherapy    Patient is on Treatment Plan: COLORECTAL FOLFIRI / BEVACIZUMAB Q14D       11/05/2020 Cancer Staging   Staging form: Colon and Rectum, AJCC 8th Edition - Clinical: Stage IVC (pM1c) - Signed by Cammie Sickle, MD on 11/05/2020      ALLERGIES:  is allergic to ace inhibitors.  MEDICATIONS:  Current Outpatient Medications  Medication Sig Dispense Refill   amLODipine (NORVASC) 5 MG tablet Take 5 mg by mouth daily.     ondansetron (ZOFRAN) 4 MG tablet Take 1 tablet (4 mg total) by mouth every 8 (eight) hours as needed for nausea or vomiting. (Patient not taking: No sig reported) 20 tablet 0   ondansetron (ZOFRAN) 8 MG tablet Take 1 tablet (8 mg total) by mouth every 8 (eight) hours as needed for nausea or vomiting. (Patient not taking: No sig reported) 20 tablet 0   oxyCODONE (OXY IR/ROXICODONE) 5 MG immediate release tablet Take 1 tablet (5 mg total) by mouth every 6 (six) hours as needed for severe pain. 60 tablet 0   pantoprazole (PROTONIX) 40 MG tablet Take 1 tablet (40 mg total) by mouth 2 (two) times daily. (Patient not taking: No sig reported) 60 tablet  3   pravastatin (PRAVACHOL) 40 MG tablet Take 40 mg by mouth daily.      prochlorperazine (COMPAZINE) 10 MG tablet Take 1 tablet (10 mg total) by mouth every 6 (six) hours as needed for nausea or vomiting. (Patient not taking: No sig reported) 60 tablet 0   sucralfate (CARAFATE) 1 g tablet TAKE 1 TABLET (1 G TOTAL) BY MOUTH 4 (FOUR) TIMES  DAILY - WITH MEALS AND AT BEDTIME. 90 tablet 3   tamsulosin (FLOMAX) 0.4 MG CAPS capsule Take 1 capsule (0.4 mg total) by mouth daily. 30 capsule 11   trifluridine-tipiracil (LONSURF) 15-6.14 MG tablet Take 5 tablets (75 mg of trifluridine total) by mouth 2 (two) times daily after a meal. 1 hr after AM & PM meals on days 1-5, 8-12. Repeat every 28day 100 tablet    No current facility-administered medications for this visit.    VITAL SIGNS: There were no vitals taken for this visit. There were no vitals filed for this visit.  Estimated body mass index is 24.68 kg/m as calculated from the following:   Height as of 05/12/21: 6' (1.829 m).   Weight as of an earlier encounter on 05/26/21: 82.6 kg (182 lb).  LABS: CBC:    Component Value Date/Time   WBC 2.7 (L) 05/26/2021 0822   HGB 10.9 (L) 05/26/2021 0822   HGB 13.3 09/18/2013 0424   HCT 33.2 (L) 05/26/2021 0822   HCT 38.1 (L) 09/18/2013 0424   PLT 108 (L) 05/26/2021 0822   PLT 307 09/18/2013 0424   MCV 96.2 05/26/2021 0822   MCV 94 09/18/2013 0424   NEUTROABS 2.0 05/26/2021 0822   NEUTROABS 17.0 (H) 09/18/2013 0424   LYMPHSABS 0.5 (L) 05/26/2021 0822   LYMPHSABS 0.9 (L) 09/18/2013 0424   MONOABS 0.1 05/26/2021 0822   MONOABS 0.4 09/18/2013 0424   EOSABS 0.1 05/26/2021 0822   EOSABS 0.0 09/18/2013 0424   BASOSABS 0.0 05/26/2021 0822   BASOSABS 0.0 09/18/2013 0424   Comprehensive Metabolic Panel:    Component Value Date/Time   NA 136 05/26/2021 0822   NA 137 09/18/2013 0424   K 3.6 05/26/2021 0822   K 4.5 09/18/2013 0424   CL 105 05/26/2021 0822   CL 107 09/18/2013 0424   CO2 24 05/26/2021 0822   CO2 28 09/18/2013 0424   BUN 15 05/26/2021 0822   BUN 19 (H) 09/18/2013 0424   CREATININE 0.81 05/26/2021 0822   CREATININE 1.15 09/18/2013 0424   GLUCOSE 181 (H) 05/26/2021 0822   GLUCOSE 196 (H) 09/18/2013 0424   CALCIUM 8.2 (L) 05/26/2021 0822   CALCIUM 9.1 09/18/2013 0424   AST 35 05/26/2021 0822   ALT 15 05/26/2021  0822   ALKPHOS 193 (H) 05/26/2021 0822   BILITOT 1.0 05/26/2021 0822   PROT 7.1 05/26/2021 0822   ALBUMIN 3.1 (L) 05/26/2021 0822     Present during today's visit: Patient only  Assessment and Plan: Continue Lonsurf, he will resume is Lonsurf on 06/09/21. For Cycle 2 he will increase his dose from $RemoveB'60mg'nREWiToV$  to $R'75mg'Pc$ , patient is aware of the dose increase.    Oral Chemotherapy Side Effect/Intolerance:  No reported diarrhea or nausea, he reports feeling fine since starting his Lonsurf.  Oral Chemotherapy Adherence: No reported missed doses No patient barriers to medication adherence identified.   New medications: None reported  Medication Access Issues: patient was approved for [place] manufacturer assistance, we is waiting for a call from them about setting up medication delivery. I asked Mr. Elisabeth Cara  to let me know when his Frankey Poot is going to be delivered.   Patient expressed understanding and was in agreement with this plan. He also understands that He can call clinic at any time with any questions, concerns, or complaints.   Follow-up plan: f/u in 2 weeks at the start of cycle 2  Thank you for allowing me to participate in the care of this very pleasant patient.   Time Total: 10 mins  Visit consisted of counseling and education on dealing with issues of symptom management in the setting of serious and potentially life-threatening illness.Greater than 50%  of this time was spent counseling and coordinating care related to the above assessment and plan.  Signed by: Darl Pikes, PharmD, BCPS, Salley Slaughter, CPP Hematology/Oncology Clinical Pharmacist Practitioner [facility]/HP/AP [facility] [phone]  05/26/2021 9:16 AM

## 2021-05-26 NOTE — Patient Instructions (Signed)
[facility]  Discharge Instructions: Thank you for choosing [facility] to provide your oncology and hematology care.  If you have a lab appointment with the [facility], please go directly to the [facility] and check in at the registration area.  Wear comfortable clothing and clothing appropriate for easy access to any Portacath or PICC line.   We strive to give you quality time with your provider. You may need to reschedule your appointment if you arrive late (15 or more minutes).  Arriving late affects you and other patients whose appointments are after yours.  Also, if you miss three or more appointments without notifying the office, you may be dismissed from the clinic at the provider's discretion.      For prescription refill requests, have your pharmacy contact our office and allow 72 hours for refills to be completed.    Today you received the following chemotherapy and/or immunotherapy agents ZIRABEV      To help prevent nausea and vomiting after your treatment, we encourage you to take your nausea medication as directed.  BELOW ARE SYMPTOMS THAT SHOULD BE REPORTED IMMEDIATELY: *FEVER GREATER THAN 100.4 F (38 C) OR HIGHER *CHILLS OR SWEATING *NAUSEA AND VOMITING THAT IS NOT CONTROLLED WITH YOUR NAUSEA MEDICATION *UNUSUAL SHORTNESS OF BREATH *UNUSUAL BRUISING OR BLEEDING *URINARY PROBLEMS (pain or burning when urinating, or frequent urination) *BOWEL PROBLEMS (unusual diarrhea, constipation, pain near the anus) TENDERNESS IN MOUTH AND THROAT WITH OR WITHOUT PRESENCE OF ULCERS (sore throat, sores in mouth, or a toothache) UNUSUAL RASH, SWELLING OR PAIN  UNUSUAL VAGINAL DISCHARGE OR ITCHING   Items with * indicate a potential emergency and should be followed up as soon as possible or go to the Emergency Department if any problems should occur.  Please show the CHEMOTHERAPY ALERT CARD or IMMUNOTHERAPY ALERT CARD at check-in to  the Emergency Department and triage nurse.  Should you have questions after your visit or need to cancel or reschedule your appointment, please contact [facility]  [phone] and follow the prompts.  Office hours are 8:00 a.m. to 4:30 p.m. Monday - Friday. Please note that voicemails left after 4:00 p.m. may not be returned until the following business day.  We are closed weekends and major holidays. You have access to a nurse at all times for urgent questions. Please call the main number to the clinic [phone] and follow the prompts.  For any non-urgent questions, you may also contact your provider using MyChart. We now offer e-Visits for anyone 62 and older to request care online for non-urgent symptoms. For details visit [email or web address].   Also download the MyChart app! Go to the app store, search "MyChart", open the app, select , and log in with your MyChart username and password.  Due to Covid, a mask is required upon entering the hospital/clinic. If you do not have a mask, one will be given to you upon arrival. For doctor visits, patients may have 1 support person aged 66 or older with them. For treatment visits, patients cannot have anyone with them due to current Covid guidelines and our immunocompromised population.   Bevacizumab injection What is this medication? BEVACIZUMAB (be va SIZ yoo mab) is a monoclonal antibody. It is used to treatmany types of cancer. This medicine may be used for other purposes; ask your health care provider orpharmacist if you have questions. COMMON BRAND NAME(S): Avastin, MVASI, Zirabev What should I tell my care team  before I take this medication? They need to know if you have any of these conditions: diabetes heart disease high blood pressure history of coughing up blood prior anthracycline chemotherapy (e.g., doxorubicin, daunorubicin, epirubicin) recent or ongoing radiation therapy recent  or planning to have surgery stroke an unusual or allergic reaction to bevacizumab, hamster proteins, mouse proteins, other medicines, foods, dyes, or preservatives pregnant or trying to get pregnant breast-feeding How should I use this medication? This medicine is for infusion into a vein. It is given by a health careprofessional in a hospital or clinic setting. Talk to your pediatrician regarding the use of this medicine in children.Special care may be needed. Overdosage: If you think you have taken too much of this medicine contact apoison control center or emergency room at once. NOTE: This medicine is only for you. Do not share this medicine with others. What if I miss a dose? It is important not to miss your dose. Call your doctor or health careprofessional if you are unable to keep an appointment. What may interact with this medication? Interactions are not expected. This list may not describe all possible interactions. Give your health care provider a list of all the medicines, herbs, non-prescription drugs, or dietary supplements you use. Also tell them if you smoke, drink alcohol, or use illegaldrugs. Some items may interact with your medicine. What should I watch for while using this medication? Your condition will be monitored carefully while you are receiving this medicine. You will need important blood work and urine testing done while youare taking this medicine. This medicine may increase your risk to bruise or bleed. Call your doctor orhealth care professional if you notice any unusual bleeding. Before having surgery, talk to your health care provider to make sure it is ok. This drug can increase the risk of poor healing of your surgical site or wound. You will need to stop this drug for 28 days before surgery. After surgery, wait at least 28 days before restarting this drug. Make sure the surgical site or wound is healed enough before restarting this drug. Talk to your health  careprovider if questions. Do not become pregnant while taking this medicine or for 6 months after stopping it. Women should inform their doctor if they wish to become pregnant or think they might be pregnant. There is a potential for serious side effects to an unborn child. Talk to your health care professional or pharmacist for more information. Do not breast-feed an infant while taking this medicine andfor 6 months after the last dose. This medicine has caused ovarian failure in some women. This medicine may interfere with the ability to have a child. You should talk to your doctor orhealth care professional if you are concerned about your fertility. What side effects may I notice from receiving this medication? Side effects that you should report to your doctor or health care professionalas soon as possible: allergic reactions like skin rash, itching or hives, swelling of the face, lips, or tongue chest pain or chest tightness chills coughing up blood high fever seizures severe constipation signs and symptoms of bleeding such as bloody or black, tarry stools; red or dark-brown urine; spitting up blood or brown material that looks like coffee grounds; red spots on the skin; unusual bruising or bleeding from the eye, gums, or nose signs and symptoms of a blood clot such as breathing problems; chest pain; severe, sudden headache; pain, swelling, warmth in the leg signs and symptoms of a stroke like changes in  vision; confusion; trouble speaking or understanding; severe headaches; sudden numbness or weakness of the face, arm or leg; trouble walking; dizziness; loss of balance or coordination stomach pain sweating swelling of legs or ankles vomiting weight gain Side effects that usually do not require medical attention (report to yourdoctor or health care professional if they continue or are bothersome): back pain changes in taste decreased appetite dry skin nausea tiredness This list may  not describe all possible side effects. Call your doctor for medical advice about side effects. You may report side effects to FDA at1-800-FDA-1088. Where should I keep my medication? This drug is given in a hospital or clinic and will not be stored at home. NOTE: This sheet is a summary. It may not cover all possible information. If you have questions about this medicine, talk to your doctor, pharmacist, orhealth care provider.  2022 Elsevier/Gold Standard (2019-08-02 10:50:46)

## 2021-05-26 NOTE — Assessment & Plan Note (Addendum)
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  Most recently s/p  FOLFIRI plus Avastin on HOLD-given local therapies to liver. s/p Ablation- MAY 2022 MRI- Multiple lvier lesions/progression-patient underwent s/p Y 90 on 06/17; awaiting repeat MRI in late sep, 2022 [IR].  Currently on oral TAS-102 (35 mg/m2 twice daily on days 1-5 and 8-12 every 28 days].   # proceed with cycle #1 day-15- Zirabev. Labs today reviewed;  acceptable for treatment today; except UA- 100 protein.   # UA protein: 100- check Urine protein ratio today;   # PN- G-1-2; sec to oxaliplatin-STABLE.   # Mediport- functioning- STABLE.   # Shoulder pain-refill oxycodone prn [place] ]- STABLE.   # DISPOSITION:  # zirabev today.  # follow up in 2weeks-- MD; labs- cbc/cmp CEA/UA; zirabev-Dr.B

## 2021-05-27 LAB — CEA: CEA: 59.1 ng/mL — ABNORMAL HIGH (ref 0.0–4.7)

## 2021-05-30 ENCOUNTER — Telehealth: Payer: Self-pay | Admitting: Pharmacist

## 2021-05-30 NOTE — Telephone Encounter (Signed)
Oral Chemotherapy Pharmacist Encounter   Mr. Haberle called to let me know his Lonsurf from [facility] Patient Assistance had arrived yesterday. He knows this will be for cycle 2 of his treatment and that he will start it on 06/09/21. Reviewed the dosing change with Mr. Pelly that will occur with cycle 2 ('60mg'$ /dose to '75mg'$ /dose). He stated his understanding of the plan and knows to call with any questions.   Darl Pikes, PharmD, BCPS, BCOP, CPP Hematology/Oncology Clinical Pharmacist [facility]/HP/AP [facility] [phone]  05/30/2021 10:59 AM

## 2021-06-09 ENCOUNTER — Encounter: Payer: Self-pay | Admitting: Internal Medicine

## 2021-06-09 ENCOUNTER — Inpatient Hospital Stay: Payer: Medicare HMO

## 2021-06-09 ENCOUNTER — Inpatient Hospital Stay: Payer: Medicare HMO | Admitting: Internal Medicine

## 2021-06-09 DIAGNOSIS — Z5112 Encounter for antineoplastic immunotherapy: Secondary | ICD-10-CM | POA: Diagnosis not present

## 2021-06-09 DIAGNOSIS — C787 Secondary malignant neoplasm of liver and intrahepatic bile duct: Secondary | ICD-10-CM | POA: Diagnosis not present

## 2021-06-09 DIAGNOSIS — K269 Duodenal ulcer, unspecified as acute or chronic, without hemorrhage or perforation: Secondary | ICD-10-CM | POA: Diagnosis not present

## 2021-06-09 DIAGNOSIS — C182 Malignant neoplasm of ascending colon: Secondary | ICD-10-CM | POA: Diagnosis not present

## 2021-06-09 DIAGNOSIS — Z7189 Other specified counseling: Secondary | ICD-10-CM

## 2021-06-09 LAB — COMPREHENSIVE METABOLIC PANEL
ALT: 16 U/L (ref 0–44)
AST: 34 U/L (ref 15–41)
Albumin: 2.9 g/dL — ABNORMAL LOW (ref 3.5–5.0)
Alkaline Phosphatase: 192 U/L — ABNORMAL HIGH (ref 38–126)
Anion gap: 6 (ref 5–15)
BUN: 9 mg/dL (ref 8–23)
CO2: 25 mmol/L (ref 22–32)
Calcium: 8 mg/dL — ABNORMAL LOW (ref 8.9–10.3)
Chloride: 105 mmol/L (ref 98–111)
Creatinine, Ser: 0.89 mg/dL (ref 0.61–1.24)
GFR, Estimated: >60 mL/min (ref >60)
Glucose, Bld: 211 mg/dL — ABNORMAL HIGH (ref 70–99)
Potassium: 3.6 mmol/L (ref 3.5–5.1)
Sodium: 136 mmol/L (ref 135–145)
Total Bilirubin: 1.1 mg/dL (ref 0.3–1.2)
Total Protein: 7.2 g/dL (ref 6.5–8.1)

## 2021-06-09 LAB — CBC WITH DIFFERENTIAL/PLATELET
Eosinophils Absolute: 0 10*3/uL (ref 0.0–0.5)
Eosinophils Relative: 3 %
HCT: 30.7 % — ABNORMAL LOW (ref 39.0–52.0)
Hemoglobin: 10.2 g/dL — ABNORMAL LOW (ref 13.0–17.0)
Lymphocytes Relative: 48 %
Lymphs Abs: 0.6 10*3/uL — ABNORMAL LOW (ref 0.7–4.0)
MCH: 33 pg (ref 26.0–34.0)
MCHC: 33.2 g/dL (ref 30.0–36.0)
MCV: 99.4 fL (ref 80.0–100.0)
Monocytes Absolute: 0.2 10*3/uL (ref 0.1–1.0)
Monocytes Relative: 20 %
Neutro Abs: 0.3 10*3/uL — CL (ref 1.7–7.7)
Neutrophils Relative %: 29 %
Platelets: 164 10*3/uL (ref 150–400)
RBC: 3.09 MIL/uL — ABNORMAL LOW (ref 4.22–5.81)
RDW: 16.9 % — ABNORMAL HIGH (ref 11.5–15.5)
WBC: 1.2 10*3/uL — CL (ref 4.0–10.5)
nRBC: 0 % (ref 0.0–0.2)

## 2021-06-09 LAB — URINALYSIS, COMPLETE (UACMP) WITH MICROSCOPIC
Glucose, UA: NEGATIVE mg/dL
Hgb urine dipstick: NEGATIVE
Leukocytes,Ua: NEGATIVE
Nitrite: POSITIVE — AB
Protein, ur: 30 mg/dL — AB
Specific Gravity, Urine: >1.03 — ABNORMAL HIGH (ref 1.005–1.030)
pH: 5 (ref 5.0–8.0)

## 2021-06-09 MED ORDER — SODIUM CHLORIDE 0.9 % IV SOLN
5.0000 mg/kg | Freq: Once | INTRAVENOUS | Status: AC
  Administered 2021-06-09: 400 mg via INTRAVENOUS
  Filled 2021-06-09: qty 16

## 2021-06-09 MED ORDER — SODIUM CHLORIDE 0.9 % IV SOLN
Freq: Once | INTRAVENOUS | Status: AC
  Filled 2021-06-09: qty 250

## 2021-06-09 MED ORDER — HEPARIN SOD (PORK) LOCK FLUSH 100 UNIT/ML IV SOLN
500.0000 [IU] | Freq: Once | INTRAVENOUS | Status: AC | PRN
  Filled 2021-06-09: qty 5

## 2021-06-09 MED ORDER — HEPARIN SOD (PORK) LOCK FLUSH 100 UNIT/ML IV SOLN
INTRAVENOUS | Status: AC
  Administered 2021-06-09: 500 [IU]
  Filled 2021-06-09: qty 5

## 2021-06-09 MED ORDER — SODIUM CHLORIDE 0.9% FLUSH
10.0000 mL | INTRAVENOUS | Status: DC | PRN
  Administered 2021-06-09: 10 mL via INTRAVENOUS
  Filled 2021-06-09: qty 10

## 2021-06-09 MED ORDER — SUCRALFATE 1 G PO TABS: 1.0000 g | ORAL_TABLET | Freq: Three times a day (TID) | ORAL | 3 refills | Status: DC

## 2021-06-09 NOTE — Progress Notes (Signed)
[facility] NOTE  Patient Care Team: Sofie Hartigan, MD as PCP - General (Family Medicine) Cammie Sickle, MD as Consulting Physician (Hematology and Oncology)  CHIEF COMPLAINTS/PURPOSE OF CONSULTATION: Colon cancer  #  Oncology History Overview Note  # MAY 2020- 3. 03/09/19 Liver biopsy. Microscopic examination shows malignant cells with glandular architecture consistent with adenocarcinoma. The malignant cells are positive for CK20 and CDX-2. These findings support the clinical impression of metastatic colon adenocarcinoma. 4. 03/10/19 R hemicolectomy. Tumor site cecum. Adenocarcinoma. Mucinous features present. G2. No tumor deposits. Invades visceral peritoneum. No tumor perforation. LVI present. PNI not identified. All margins uninvolved. 1/12 LNs. PT4apN1. Periappendiceal inflammation c/w resolving abscess. Microsatellite stable (MSS). [Dr.Mettu; [facility]]  # SEP 4th 2020 [compared to May 2020]  Interval increase in size of the metastases to the hepatic dome, The metastasis to the left hepatic lobe is unchanged; 2.  New subcentimeter hypoattenuating lesion in the inferior right hepatic lobe, incompletely characterized on CT. 3.  Postsurgical changes following right hemicolectomy.  # OCT 2020- FOLFOX +avastin; CT dec 22nd 2020- [compared to [facility] sep 9th 2020]-Liver- slight progression versus stable disease; CT scan SEP 4th 2021- Progressive disease-left lower lobe lung nodule 8 mm [previously 4 mm]; increase in size of the hepatic metastases by few millimeters.;  Soft tissue nodule adjacent to anastomotic site again increased by few millimeters.STOP FOLFOX; cont avastin  #  SEP 20th, 2021- FOLFIRI+ AVASTIN; HOLD FEB 2022- sec to poor tolerance [peptic ulcer]; 3/30-liver ablation-   [bland embolization of segment 2/3 liver lesion on 12/19/2020;  microwave ablation of both liver lesions.  # July 25th, 2022-   # FEB /16-EGD [Dr.Anna-duodenal ulcer-? meloxicam]  #  JAN 2022- COVID [s/p Mab infusion; pills; skin rash resolved.]  # NGS/F-ONE-MUTATED K-RAS [G]  # PALLIATIVE CARE EVALUATION: 09/20/2019-[name]  # PAIN MANAGEMENT: NA   DIAGNOSIS: COLON CANCER  STAGE:  IV     ;  GOALS:Palliative  CURRENT/MOST RECENT THERAPY : FOLFIRI+ avastin [C]    Cancer of right colon ([place])  07/05/2019 Initial Diagnosis   Cancer of right colon ([place])   07/24/2019 - 06/25/2020 Chemotherapy   The patient had dexamethasone (DECADRON) 4 MG tablet, 8 mg, Oral, Daily, 1 of 1 cycle, Start date: --, End date: -- palonosetron (ALOXI) injection 0.25 mg, 0.25 mg, Intravenous,  Once, 23 of 25 cycles Administration: 0.25 mg (07/24/2019), 0.25 mg (08/07/2019), 0.25 mg (08/21/2019), 0.25 mg (09/04/2019), 0.25 mg (09/20/2019), 0.25 mg (10/04/2019), 0.25 mg (10/16/2019), 0.25 mg (10/30/2019), 0.25 mg (11/22/2019), 0.25 mg (12/06/2019), 0.25 mg (12/20/2019), 0.25 mg (01/03/2020), 0.25 mg (01/29/2020), 0.25 mg (02/12/2020), 0.25 mg (02/26/2020), 0.25 mg (03/11/2020), 0.25 mg (03/25/2020), 0.25 mg (04/08/2020), 0.25 mg (04/24/2020), 0.25 mg (05/08/2020), 0.25 mg (05/22/2020), 0.25 mg (06/10/2020), 0.25 mg (06/25/2020) leucovorin 800 mg in dextrose 5 % 250 mL infusion, 844 mg, Intravenous,  Once, 23 of 25 cycles Administration: 800 mg (07/24/2019), 800 mg (08/07/2019), 800 mg (08/21/2019), 800 mg (09/04/2019), 800 mg (09/20/2019), 800 mg (10/04/2019), 800 mg (10/16/2019), 800 mg (10/30/2019), 800 mg (11/22/2019), 800 mg (12/06/2019), 800 mg (12/20/2019), 800 mg (01/03/2020), 800 mg (01/29/2020), 800 mg (02/12/2020), 800 mg (02/26/2020), 800 mg (03/11/2020), 800 mg (03/25/2020), 800 mg (04/08/2020), 800 mg (04/24/2020), 800 mg (05/08/2020), 800 mg (05/22/2020), 800 mg (06/10/2020), 800 mg (06/25/2020) oxaliplatin (ELOXATIN) 180 mg in dextrose 5 % 500 mL chemo infusion, 85 mg/m2 = 180 mg, Intravenous,  Once, 23 of 25 cycles Dose modification: 178 mg (original dose 85 mg/m2, Cycle 17, Reason:  Other (see comments), Comment: insurance adjusted dose  ) Administration: 180 mg (07/24/2019), 180 mg (08/07/2019), 180 mg (08/21/2019), 180 mg (09/04/2019), 180 mg (09/20/2019), 180 mg (10/04/2019), 180 mg (10/16/2019), 180 mg (10/30/2019), 180 mg (11/22/2019), 180 mg (12/06/2019), 180 mg (12/20/2019), 180 mg (01/03/2020), 180 mg (01/29/2020), 180 mg (02/12/2020), 180 mg (02/26/2020), 180 mg (03/11/2020), 180 mg (04/08/2020), 180 mg (04/24/2020), 180 mg (05/08/2020), 180 mg (05/22/2020), 180 mg (06/10/2020), 180 mg (06/25/2020) fluorouracil (ADRUCIL) 5,000 mg in sodium chloride 0.9 % 150 mL chemo infusion, 5,050 mg, Intravenous, 1 Day/Dose, 23 of 25 cycles Administration: 5,000 mg (07/24/2019), 5,000 mg (08/07/2019), 5,000 mg (08/21/2019), 5,050 mg (09/04/2019), 5,000 mg (10/04/2019), 5,000 mg (10/16/2019), 5,000 mg (11/22/2019), 5,000 mg (12/06/2019), 5,000 mg (12/20/2019), 5,000 mg (01/03/2020), 5,000 mg (01/29/2020), 5,000 mg (02/12/2020), 5,000 mg (02/26/2020), 5,000 mg (03/11/2020), 5,000 mg (03/25/2020), 5,000 mg (04/08/2020), 5,000 mg (04/24/2020), 5,000 mg (05/08/2020), 5,000 mg (05/22/2020), 5,000 mg (06/10/2020), 5,000 mg (06/25/2020) bevacizumab-bvzr (ZIRABEV) 400 mg in sodium chloride 0.9 % 100 mL chemo infusion, 5 mg/kg = 400 mg, Intravenous,  Once, 23 of 25 cycles Administration: 400 mg (08/07/2019), 400 mg (08/21/2019), 400 mg (09/04/2019), 400 mg (09/20/2019), 400 mg (10/04/2019), 400 mg (10/16/2019), 400 mg (10/30/2019), 400 mg (11/22/2019), 400 mg (12/06/2019), 400 mg (12/20/2019), 400 mg (01/03/2020), 400 mg (01/29/2020), 400 mg (02/12/2020), 400 mg (02/26/2020), 400 mg (03/11/2020), 400 mg (03/25/2020), 400 mg (04/08/2020), 400 mg (04/24/2020), 400 mg (05/08/2020), 400 mg (05/22/2020), 400 mg (06/10/2020), 400 mg (06/25/2020)   for chemotherapy treatment.     07/08/2020 -  Chemotherapy    Patient is on Treatment Plan: COLORECTAL FOLFIRI / BEVACIZUMAB Q14D       11/05/2020 Cancer Staging   Staging form: Colon and Rectum, AJCC 8th Edition - Clinical: Stage IVC (pM1c) - Signed by Cammie Sickle, MD  on 11/05/2020     HISTORY OF PRESENTING ILLNESS:  Oscar [name] 80 y.o.  male with metastatic colon cancer to the liver- -currently on bevacizumab plus Lonsurf is here for follow-up.  No nausea no vomiting.  No fevers or chills.  No sores in the mouth.  No worsening abdominal pain or diarrhea.   Review of Systems  Constitutional:  Positive for malaise/fatigue. Negative for chills, diaphoresis and fever.  HENT:  Negative for nosebleeds and sore throat.   Eyes:  Negative for double vision.  Respiratory:  Negative for cough, hemoptysis, sputum production, shortness of breath and wheezing.   Cardiovascular:  Negative for chest pain, palpitations, orthopnea and leg swelling.  Gastrointestinal:  Negative for blood in stool, constipation, heartburn, melena, nausea and vomiting.  Genitourinary:  Negative for dysuria, frequency and urgency.  Musculoskeletal:  Positive for back pain and joint pain.  Skin: Negative.  Negative for itching and rash.  Neurological:  Positive for tingling. Negative for focal weakness, weakness and headaches.  Endo/Heme/Allergies:  Does not bruise/bleed easily.  Psychiatric/Behavioral:  Negative for depression. The patient is not nervous/anxious and does not have insomnia.     MEDICAL HISTORY:  Past Medical History:  Diagnosis Date  . Arthritis   . Cancer ([place])    colon cancer 02/2019 per pt   . Family history of adverse reaction to anesthesia    cousin took all night to wake up from anesthesia  . H/O colon cancer, stage IV   . Hyperlipemia   . Neuromuscular disorder ([place])   . Pre-diabetes     SURGICAL HISTORY: Past Surgical History:  Procedure Laterality Date  . COLON SURGERY    .  ESOPHAGOGASTRODUODENOSCOPY (EGD) WITH PROPOFOL N/A 12/26/2020   Procedure: ESOPHAGOGASTRODUODENOSCOPY (EGD) WITH PROPOFOL;  Surgeon: [name], MD;  Location[facility] ENDOSCOPY;  Service: Gastroenterology;  Laterality: N/A;  . IR ANGIOGRAM SELECTIVE EACH ADDITIONAL VESSEL   12/19/2020  . IR ANGIOGRAM SELECTIVE EACH ADDITIONAL VESSEL  03/24/2021  . IR ANGIOGRAM SELECTIVE EACH ADDITIONAL VESSEL  03/24/2021  . IR ANGIOGRAM SELECTIVE EACH ADDITIONAL VESSEL  04/04/2021  . IR ANGIOGRAM SELECTIVE EACH ADDITIONAL VESSEL  04/04/2021  . IR ANGIOGRAM SELECTIVE EACH ADDITIONAL VESSEL  04/04/2021  . IR ANGIOGRAM VISCERAL SELECTIVE  12/19/2020  . IR ANGIOGRAM VISCERAL SELECTIVE  03/24/2021  . IR ANGIOGRAM VISCERAL SELECTIVE  04/04/2021  . IR ANGIOGRAM VISCERAL SELECTIVE  04/04/2021  . IR EMBO ARTERIAL NOT HEMORR HEMANG INC GUIDE ROADMAPPING  03/24/2021  . IR EMBO TUMOR ORGAN ISCHEMIA INFARCT INC GUIDE ROADMAPPING  12/19/2020  . IR EMBO TUMOR ORGAN ISCHEMIA INFARCT INC GUIDE ROADMAPPING  04/04/2021  . IR IMAGING GUIDED PORT INSERTION  07/20/2019  . IR RADIOLOGIST EVAL & MGMT  12/05/2020  . IR RADIOLOGIST EVAL & MGMT  02/11/2021  . IR RADIOLOGIST EVAL & MGMT  03/04/2021  . IR RADIOLOGIST EVAL & MGMT  04/29/2021  . IR US GUIDE VASC ACCESS RIGHT  12/19/2020  . IR US GUIDE VASC ACCESS RIGHT  03/24/2021  . IR US GUIDE VASC ACCESS RIGHT  04/04/2021  . JOINT REPLACEMENT Left 2010   Knee  . RADIOLOGY WITH ANESTHESIA N/A 01/15/2021   Procedure: CT WITH ANESTHESIA MICROWAVE ABLATION OF LIVER;  Surgeon: Criselda Peaches, MD;  Location: [facility] ORS;  Service: Anesthesiology;  Laterality: N/A;    SOCIAL HISTORY: Social History   Socioeconomic History  . Marital status: Married    Spouse name: Not on file  . Number of children: Not on file  . Years of education: Not on file  . Highest education level: Not on file  Occupational History  . Not on file  Tobacco Use  . Smoking status: Every Day    Packs/day: 0.25    Years: 15.00    Pack years: 3.75    Types: Cigarettes  . Smokeless tobacco: Never  . Tobacco comments:    1 to 2 cigarettes a day occasionally  Vaping Use  . Vaping Use: Never used  Substance and Sexual Activity  . Alcohol use: Yes    Comment: rarely   . Drug use: No  . Sexual activity:  Not on file  Other Topics Concern  . Not on file  Social History Narrative   Recruitment consultant retd; lives in [place]; smoking 3cig/day; [3/4 ppd x started at 9 years]; no alcohol. Son & daughter; wife dementia [waiting for placement].    Social Determinants of Health   Financial Resource Strain: Not on file  Food Insecurity: Not on file  Transportation Needs: Not on file  Physical Activity: Not on file  Stress: Not on file  Social Connections: Not on file  Intimate Partner Violence: Not on file    FAMILY HISTORY: Family History  Problem Relation Age of Onset  . Peptic Ulcer Disease Father     ALLERGIES:  is allergic to ace inhibitors.  MEDICATIONS:  Current Outpatient Medications  Medication Sig Dispense Refill  . amLODipine (NORVASC) 5 MG tablet Take 5 mg by mouth daily.    Marland Kitchen oxyCODONE (OXY IR/ROXICODONE) 5 MG immediate release tablet Take 1 tablet (5 mg total) by mouth every 6 (six) hours as needed for severe pain. 60 tablet 0  . tamsulosin (  FLOMAX) 0.4 MG CAPS capsule Take 1 capsule (0.4 mg total) by mouth daily. 30 capsule 11  . trifluridine-tipiracil (LONSURF) 15-6.14 MG tablet Take 5 tablets (75 mg of trifluridine total) by mouth 2 (two) times daily after a meal. 1 hr after AM & PM meals on days 1-5, 8-12. Repeat every 28day 100 tablet   . ondansetron (ZOFRAN) 4 MG tablet Take 1 tablet (4 mg total) by mouth every 8 (eight) hours as needed for nausea or vomiting. (Patient not taking: No sig reported) 20 tablet 0  . ondansetron (ZOFRAN) 8 MG tablet Take 1 tablet (8 mg total) by mouth every 8 (eight) hours as needed for nausea or vomiting. (Patient not taking: No sig reported) 20 tablet 0  . pravastatin (PRAVACHOL) 40 MG tablet Take 40 mg by mouth daily.  (Patient not taking: Reported on 06/09/2021)    . prochlorperazine (COMPAZINE) 10 MG tablet Take 1 tablet (10 mg total) by mouth every 6 (six) hours as needed for nausea or vomiting. (Patient not taking: No sig  reported) 60 tablet 0  . sucralfate (CARAFATE) 1 g tablet Take 1 tablet (1 g total) by mouth 4 (four) times daily -  with meals and at bedtime. 90 tablet 3   No current facility-administered medications for this visit.   Facility-Administered Medications Ordered in Other Visits  Medication Dose Route Frequency Provider Last Rate Last Admin  . bevacizumab-bvzr (ZIRABEV) 400 mg in sodium chloride 0.9 % 100 mL chemo infusion  5 mg/kg (Treatment Plan Recorded) Intravenous Once Charlaine Dalton R, MD      . heparin lock flush 100 unit/mL  500 Units Intracatheter Once PRN Charlaine Dalton R, MD      . sodium chloride flush (NS) 0.9 % injection 10 mL  10 mL Intravenous PRN Cammie Sickle, MD   10 mL at 06/09/21 0841      .  PHYSICAL EXAMINATION: ECOG PERFORMANCE STATUS: 0 - Asymptomatic  Vitals:   06/09/21 0851  BP: (!) 144/88  Pulse: 78  Resp: 18  Temp: 98.3 F (36.8 C)  SpO2: 100%   Filed Weights   06/09/21 0851  Weight: 183 lb (83 kg)    Physical Exam HENT:     Head: Normocephalic and atraumatic.     Mouth/Throat:     Pharynx: No oropharyngeal exudate.     Comments: Whitish patches noted right side.  Eyes:     Pupils: Pupils are equal, round, and reactive to light.  Cardiovascular:     Rate and Rhythm: Normal rate and regular rhythm.  Pulmonary:     Effort: No respiratory distress.     Breath sounds: No wheezing.     Comments: Decreased breath sounds bilaterally.  No wheeze or crackles Abdominal:     General: Bowel sounds are normal. There is no distension.     Palpations: Abdomen is soft. There is no mass.     Tenderness: There is no abdominal tenderness. There is no guarding or rebound.  Musculoskeletal:        General: No tenderness. Normal range of motion.     Cervical back: Normal range of motion and neck supple.  Skin:    General: Skin is warm.  Neurological:     Mental Status: He is alert and oriented to person, place, and time.  Psychiatric:         Mood and Affect: Affect normal.   LABORATORY DATA:  I have reviewed the data as listed Lab Results  Component Value  Date   WBC 1.2 (LL) 06/09/2021   HGB 10.2 (L) 06/09/2021   HCT 30.7 (L) 06/09/2021   MCV 99.4 06/09/2021   PLT 164 06/09/2021   Recent Labs    06/25/20 0841 07/08/20 0843 07/22/20 0836 08/05/20 0821 05/12/21 0824 05/26/21 0822 06/09/21 0830  NA 138 139 138   < > 137 136 136  K 3.6 3.6 3.6   < > 3.6 3.6 3.6  CL 108 108 107   < > 107 105 105  CO2 $Re'24 25 25   'phX$ < > $R'24 24 25  'xE$ GLUCOSE 201* 209* 195*   < > 197* 181* 211*  BUN $Re'10 9 10   'cIM$ < > $R'12 15 9  'Si$ CREATININE 0.80 0.70 0.89   < > 0.86 0.81 0.89  CALCIUM 8.0* 8.0* 8.1*   < > 8.3* 8.2* 8.0*  GFRNONAA >60 >60 >60   < > >60 >60 >60  GFRAA >60 >60 >60  --   --   --   --   PROT 6.5 6.5 6.7   < > 6.7 7.1 7.2  ALBUMIN 3.2* 3.1* 3.1*   < > 3.0* 3.1* 2.9*  AST 32 31 29   < > 36 35 34  ALT $Re'20 19 19   'hOs$ < > $R'16 15 16  'AN$ ALKPHOS 121 102 108   < > 208* 193* 192*  BILITOT 0.8 0.7 0.8   < > 0.9 1.0 1.1   < > = values in this interval not displayed.    RADIOGRAPHIC STUDIES: I have personally reviewed the radiological images as listed and agreed with the findings in the report. No results found.   ASSESSMENT & PLAN:   Cancer of right colon ([place]) #Right-sided colon adenocarcinoma-with synchronous metastasis to liver/unresectable.  Most recently s/p  FOLFIRI plus Avastin on HOLD-given local therapies to liver. s/p Ablation- MAY 2022 MRI- Multiple lvier lesions/progression-patient underwent s/p Y 90 on 06/17; awaiting repeat MRI in late sep, 2022 [IR].  Currently on oral TAS-102 (35 mg/m2 twice daily on days 1-5 and 8-12 every 28 days].   # proceed with cycle #2 day-1- Zirabev. Labs today reviewed;  acceptable for treatment today; except UA- 100 protein. HOLD TAS-102 sec to severe neutropenia [ANC 400].    # UA protein: 100- check Urine protein ratio today; Protein Creatinine Ratio: 0.05  # PN- G-1-2; sec to oxaliplatin-STABLE.    # Mediport- functioning- STABLE. .   # Shoulder pain-refill oxycodone prn Silver.Hawking ]- STABLE.    *will re: TAS # DISPOSITION:   # zirabev today.  # in 1 week- CBC # follow up in 2weeks-- MD; labs- cbc/cmp CEA/UA; zirabev-Dr.B   All questions were answered. The patient knows to call the clinic with any problems, questions or concerns.    Cammie Sickle, MD 06/09/2021 10:21 AM

## 2021-06-09 NOTE — Assessment & Plan Note (Addendum)
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  Most recently s/p  FOLFIRI plus Avastin on HOLD-given local therapies to liver. s/p Ablation- MAY 2022 MRI- Multiple lvier lesions/progression-patient underwent s/p Y 90 on 06/17; awaiting repeat MRI in late sep, 2022 [IR].  Currently on oral TAS-102 (35 mg/m2 twice daily on days 1-5 and 8-12 every 28 days].   # proceed with cycle #2 day-1- Zirabev. Labs today reviewed;  acceptable for treatment today; except UA- 100 protein. HOLD TAS-102 sec to severe neutropenia [ANC 400].    # UA protein: 100- check Urine protein ratio today; Protein Creatinine Ratio: 0.05  # PN- G-1-2; sec to oxaliplatin-STABLE.   # Mediport- functioning- STABLE. .   # Shoulder pain-refill oxycodone prn Silver.Hawking ]- STABLE.    *will re: TAS # DISPOSITION:  # zirabev today.  # in 1 week- CBC # follow up in 2weeks-- MD; labs- cbc/cmp CEA/UA; zirabev-Dr.B

## 2021-06-09 NOTE — Patient Instructions (Signed)
#  Do not start chemo pill today; repeat blood work in 1 week; further instructions to start the pill based upon the blood work

## 2021-06-09 NOTE — Progress Notes (Signed)
ANC: 300. Also, today's urine protein lab is still in process and has not resulted. MD, Dr. Rogue Bussing, notified and aware. Per MD order: reference 05/26/2021 urine protein result and proceed with scheduled Zirabev treatment today.

## 2021-06-09 NOTE — Patient Instructions (Signed)
[facility] ONCOLOGY   Discharge Instructions: Thank you for choosing [facility] to provide your oncology and hematology care.  If you have a lab appointment with the [facility], please go directly to the [facility] and check in at the registration area.  Wear comfortable clothing and clothing appropriate for easy access to any Portacath or PICC line.   We strive to give you quality time with your provider. You may need to reschedule your appointment if you arrive late (15 or more minutes).  Arriving late affects you and other patients whose appointments are after yours.  Also, if you miss three or more appointments without notifying the office, you may be dismissed from the clinic at the provider's discretion.      For prescription refill requests, have your pharmacy contact our office and allow 72 hours for refills to be completed.    Today you received the following chemotherapy and/or immunotherapy agents: Zirabev.      To help prevent nausea and vomiting after your treatment, we encourage you to take your nausea medication as directed.  BELOW ARE SYMPTOMS THAT SHOULD BE REPORTED IMMEDIATELY: *FEVER GREATER THAN 100.4 F (38 C) OR HIGHER *CHILLS OR SWEATING *NAUSEA AND VOMITING THAT IS NOT CONTROLLED WITH YOUR NAUSEA MEDICATION *UNUSUAL SHORTNESS OF BREATH *UNUSUAL BRUISING OR BLEEDING *URINARY PROBLEMS (pain or burning when urinating, or frequent urination) *BOWEL PROBLEMS (unusual diarrhea, constipation, pain near the anus) TENDERNESS IN MOUTH AND THROAT WITH OR WITHOUT PRESENCE OF ULCERS (sore throat, sores in mouth, or a toothache) UNUSUAL RASH, SWELLING OR PAIN  UNUSUAL VAGINAL DISCHARGE OR ITCHING   Items with * indicate a potential emergency and should be followed up as soon as possible or go to the Emergency Department if any problems should occur.  Please show the CHEMOTHERAPY ALERT CARD or IMMUNOTHERAPY ALERT CARD at check-in  to the Emergency Department and triage nurse.  Should you have questions after your visit or need to cancel or reschedule your appointment, please contact [facility]  [phone] and follow the prompts.  Office hours are 8:00 a.m. to 4:30 p.m. Monday - Friday. Please note that voicemails left after 4:00 p.m. may not be returned until the following business day.  We are closed weekends and major holidays. You have access to a nurse at all times for urgent questions. Please call the main number to the clinic [phone] and follow the prompts.  For any non-urgent questions, you may also contact your provider using MyChart. We now offer e-Visits for anyone 61 and older to request care online for non-urgent symptoms. For details visit [email or web address].   Also download the MyChart app! Go to the app store, search "MyChart", open the app, select [facility], and log in with your MyChart username and password.  Due to Covid, a mask is required upon entering the hospital/clinic. If you do not have a mask, one will be given to you upon arrival. For doctor visits, patients may have 1 support person aged 18 or older with them. For treatment visits, patients cannot have anyone with them due to current Covid guidelines and our immunocompromised population.

## 2021-06-10 LAB — CEA: CEA: 91.6 ng/mL — ABNORMAL HIGH (ref 0.0–4.7)

## 2021-06-12 ENCOUNTER — Other Ambulatory Visit: Payer: Self-pay | Admitting: *Deleted

## 2021-06-12 DIAGNOSIS — D701 Agranulocytosis secondary to cancer chemotherapy: Secondary | ICD-10-CM

## 2021-06-13 ENCOUNTER — Other Ambulatory Visit: Payer: Self-pay | Admitting: Interventional Radiology

## 2021-06-13 DIAGNOSIS — C787 Secondary malignant neoplasm of liver and intrahepatic bile duct: Secondary | ICD-10-CM

## 2021-06-16 ENCOUNTER — Inpatient Hospital Stay: Payer: Medicare HMO

## 2021-06-16 ENCOUNTER — Other Ambulatory Visit: Payer: Self-pay

## 2021-06-16 DIAGNOSIS — T451X5A Adverse effect of antineoplastic and immunosuppressive drugs, initial encounter: Secondary | ICD-10-CM

## 2021-06-16 DIAGNOSIS — Z5112 Encounter for antineoplastic immunotherapy: Secondary | ICD-10-CM | POA: Diagnosis not present

## 2021-06-16 DIAGNOSIS — C182 Malignant neoplasm of ascending colon: Secondary | ICD-10-CM | POA: Diagnosis not present

## 2021-06-16 DIAGNOSIS — C787 Secondary malignant neoplasm of liver and intrahepatic bile duct: Secondary | ICD-10-CM | POA: Diagnosis not present

## 2021-06-16 LAB — CBC WITH DIFFERENTIAL/PLATELET
Abs Immature Granulocytes: 0.01 10*3/uL (ref 0.00–0.07)
Basophils Absolute: 0.1 10*3/uL (ref 0.0–0.1)
Basophils Relative: 2 %
Eosinophils Absolute: 0 10*3/uL (ref 0.0–0.5)
Eosinophils Relative: 1 %
HCT: 35.3 % — ABNORMAL LOW (ref 39.0–52.0)
Hemoglobin: 11.7 g/dL — ABNORMAL LOW (ref 13.0–17.0)
Immature Granulocytes: 0 %
Lymphocytes Relative: 19 %
Lymphs Abs: 0.6 10*3/uL — ABNORMAL LOW (ref 0.7–4.0)
MCH: 33.3 pg (ref 26.0–34.0)
MCHC: 33.1 g/dL (ref 30.0–36.0)
MCV: 100.6 fL — ABNORMAL HIGH (ref 80.0–100.0)
Monocytes Absolute: 0.4 10*3/uL (ref 0.1–1.0)
Monocytes Relative: 13 %
Neutro Abs: 1.9 10*3/uL (ref 1.7–7.7)
Neutrophils Relative %: 65 %
Platelets: 149 10*3/uL — ABNORMAL LOW (ref 150–400)
RBC: 3.51 MIL/uL — ABNORMAL LOW (ref 4.22–5.81)
RDW: 17.5 % — ABNORMAL HIGH (ref 11.5–15.5)
WBC: 3 10*3/uL — ABNORMAL LOW (ref 4.0–10.5)
nRBC: 0 % (ref 0.0–0.2)

## 2021-06-25 ENCOUNTER — Inpatient Hospital Stay: Payer: Medicare HMO

## 2021-06-25 ENCOUNTER — Inpatient Hospital Stay: Payer: Medicare HMO | Admitting: Internal Medicine

## 2021-06-25 ENCOUNTER — Other Ambulatory Visit: Payer: Self-pay

## 2021-06-25 ENCOUNTER — Inpatient Hospital Stay: Payer: Medicare HMO | Attending: Hospice and Palliative Medicine

## 2021-06-25 ENCOUNTER — Encounter: Payer: Self-pay | Admitting: Internal Medicine

## 2021-06-25 DIAGNOSIS — C182 Malignant neoplasm of ascending colon: Secondary | ICD-10-CM

## 2021-06-25 DIAGNOSIS — Z5112 Encounter for antineoplastic immunotherapy: Secondary | ICD-10-CM | POA: Diagnosis not present

## 2021-06-25 DIAGNOSIS — C787 Secondary malignant neoplasm of liver and intrahepatic bile duct: Secondary | ICD-10-CM | POA: Diagnosis not present

## 2021-06-25 DIAGNOSIS — Z7189 Other specified counseling: Secondary | ICD-10-CM

## 2021-06-25 LAB — COMPREHENSIVE METABOLIC PANEL
ALT: 17 U/L (ref 0–44)
AST: 35 U/L (ref 15–41)
Albumin: 3.1 g/dL — ABNORMAL LOW (ref 3.5–5.0)
Alkaline Phosphatase: 173 U/L — ABNORMAL HIGH (ref 38–126)
Anion gap: 4 — ABNORMAL LOW (ref 5–15)
BUN: 13 mg/dL (ref 8–23)
CO2: 26 mmol/L (ref 22–32)
Calcium: 8.3 mg/dL — ABNORMAL LOW (ref 8.9–10.3)
Chloride: 105 mmol/L (ref 98–111)
Creatinine, Ser: 0.84 mg/dL (ref 0.61–1.24)
GFR, Estimated: >60 mL/min (ref >60)
Glucose, Bld: 195 mg/dL — ABNORMAL HIGH (ref 70–99)
Potassium: 3.7 mmol/L (ref 3.5–5.1)
Sodium: 135 mmol/L (ref 135–145)
Total Bilirubin: 0.7 mg/dL (ref 0.3–1.2)
Total Protein: 7.7 g/dL (ref 6.5–8.1)

## 2021-06-25 LAB — URINALYSIS, COMPLETE (UACMP) WITH MICROSCOPIC
Glucose, UA: 100 mg/dL — AB
Hgb urine dipstick: NEGATIVE
Leukocytes,Ua: NEGATIVE
Nitrite: POSITIVE — AB
Specific Gravity, Urine: >1.03 — ABNORMAL HIGH (ref 1.005–1.030)
pH: 5 (ref 5.0–8.0)

## 2021-06-25 LAB — CBC WITH DIFFERENTIAL/PLATELET
Abs Immature Granulocytes: 0.01 10*3/uL (ref 0.00–0.07)
Basophils Absolute: 0.1 10*3/uL (ref 0.0–0.1)
Basophils Relative: 2 %
Eosinophils Absolute: 0.2 10*3/uL (ref 0.0–0.5)
Eosinophils Relative: 6 %
HCT: 33.2 % — ABNORMAL LOW (ref 39.0–52.0)
Hemoglobin: 11.1 g/dL — ABNORMAL LOW (ref 13.0–17.0)
Immature Granulocytes: 0 %
Lymphocytes Relative: 16 %
Lymphs Abs: 0.6 10*3/uL — ABNORMAL LOW (ref 0.7–4.0)
MCH: 33.7 pg (ref 26.0–34.0)
MCHC: 33.4 g/dL (ref 30.0–36.0)
MCV: 100.9 fL — ABNORMAL HIGH (ref 80.0–100.0)
Monocytes Absolute: 0.3 10*3/uL (ref 0.1–1.0)
Monocytes Relative: 8 %
Neutro Abs: 2.6 10*3/uL (ref 1.7–7.7)
Neutrophils Relative %: 68 %
Platelets: 141 10*3/uL — ABNORMAL LOW (ref 150–400)
RBC: 3.29 MIL/uL — ABNORMAL LOW (ref 4.22–5.81)
RDW: 17.8 % — ABNORMAL HIGH (ref 11.5–15.5)
WBC: 3.8 10*3/uL — ABNORMAL LOW (ref 4.0–10.5)
nRBC: 0 % (ref 0.0–0.2)

## 2021-06-25 MED ORDER — HEPARIN SOD (PORK) LOCK FLUSH 100 UNIT/ML IV SOLN
500.0000 [IU] | Freq: Once | INTRAVENOUS | Status: AC
  Filled 2021-06-25: qty 5

## 2021-06-25 MED ORDER — SODIUM CHLORIDE 0.9% FLUSH
10.0000 mL | Freq: Once | INTRAVENOUS | Status: AC
  Administered 2021-06-25: 10 mL via INTRAVENOUS
  Filled 2021-06-25: qty 10

## 2021-06-25 MED ORDER — SODIUM CHLORIDE 0.9 % IV SOLN
Freq: Once | INTRAVENOUS | Status: AC
  Filled 2021-06-25: qty 250

## 2021-06-25 MED ORDER — HEPARIN SOD (PORK) LOCK FLUSH 100 UNIT/ML IV SOLN
500.0000 [IU] | Freq: Once | INTRAVENOUS | Status: DC | PRN
Start: 2021-06-25 — End: 2021-06-25
  Filled 2021-06-25: qty 5

## 2021-06-25 MED ORDER — HEPARIN SOD (PORK) LOCK FLUSH 100 UNIT/ML IV SOLN
INTRAVENOUS | Status: AC
  Administered 2021-06-25: 500 [IU] via INTRAVENOUS
  Filled 2021-06-25: qty 5

## 2021-06-25 MED ORDER — SODIUM CHLORIDE 0.9 % IV SOLN
5.0000 mg/kg | Freq: Once | INTRAVENOUS | Status: AC
  Administered 2021-06-25: 400 mg via INTRAVENOUS
  Filled 2021-06-25: qty 16

## 2021-06-25 NOTE — Patient Instructions (Signed)
#  Do not start your cancer pill; until next visit.

## 2021-06-25 NOTE — Assessment & Plan Note (Addendum)
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  Most recently s/p  FOLFIRI plus Avastin on HOLD-given local therapies to liver. s/p Ablation- MAY 2022 MRI- Multiple lvier lesions/progression-patient underwent s/p Y 90 on 06/17; awaiting repeat MRI in late sep, 2022 [IR].  Currently on oral TAS-102 (35 mg/m2 twice daily on days 1-5 and 8-12 every 28 days].   # proceed with cycle #2 day-15 - Zirabev. Labs today reviewed;  acceptable for treatment today; except UA- 100 protein. HOLD cycle #2 of TAS 102 [given the recent neutropenia]; will start back with cycle #3/in 12 days.  # UA protein: 100- check Urine protein ratio today; Protein Creatinine Ratio: 0.05- STABLE  # PN- G-1-2; sec to oxaliplatin- STABLE.   # Mediport- functioning- STABLE.   # Shoulder pain-refill oxycodone prn [[name] ]-STABLE.  Okay to  # DISPOSITION:  # zirabev today.  # follow up on 9/19-- MD; labs- cbc/cmp CEA/UA; zirabev-Dr.B

## 2021-06-25 NOTE — Patient Instructions (Signed)
[facility] [place] [facility]  Discharge Instructions: Thank you for choosing [facility] to provide your oncology and hematology care.  If you have a lab appointment with the Cancer Center, please go directly to the Cancer Center and check in at the registration area.  Wear comfortable clothing and clothing appropriate for easy access to any Portacath or PICC line.   We strive to give you quality time with your provider. You may need to reschedule your appointment if you arrive late (15 or more minutes).  Arriving late affects you and other patients whose appointments are after yours.  Also, if you miss three or more appointments without notifying the office, you may be dismissed from the clinic at the provider's discretion.      For prescription refill requests, have your pharmacy contact our office and allow 72 hours for refills to be completed.    Today you received the following chemotherapy and/or immunotherapy agents Avastin    To help prevent nausea and vomiting after your treatment, we encourage you to take your nausea medication as directed.  BELOW ARE SYMPTOMS THAT SHOULD BE REPORTED IMMEDIATELY: *FEVER GREATER THAN 100.4 F (38 C) OR HIGHER *CHILLS OR SWEATING *NAUSEA AND VOMITING THAT IS NOT CONTROLLED WITH YOUR NAUSEA MEDICATION *UNUSUAL SHORTNESS OF BREATH *UNUSUAL BRUISING OR BLEEDING *URINARY PROBLEMS (pain or burning when urinating, or frequent urination) *BOWEL PROBLEMS (unusual diarrhea, constipation, pain near the anus) TENDERNESS IN MOUTH AND THROAT WITH OR WITHOUT PRESENCE OF ULCERS (sore throat, sores in mouth, or a toothache) UNUSUAL RASH, SWELLING OR PAIN  UNUSUAL VAGINAL DISCHARGE OR ITCHING   Items with * indicate a potential emergency and should be followed up as soon as possible or go to the Emergency Department if any problems should occur.  Please show the CHEMOTHERAPY ALERT CARD or IMMUNOTHERAPY ALERT CARD at check-in to  the Emergency Department and triage nurse.  Should you have questions after your visit or need to cancel or reschedule your appointment, please contact [facility] [place] [facility]  [phone] and follow the prompts.  Office hours are 8:00 a.m. to 4:30 p.m. Monday - Friday. Please note that voicemails left after 4:00 p.m. may not be returned until the following business day.  We are closed weekends and major holidays. You have access to a nurse at all times for urgent questions. Please call the main number to the clinic [phone] and follow the prompts.  For any non-urgent questions, you may also contact your provider using MyChart. We now offer e-Visits for anyone 18 and older to request care online for non-urgent symptoms. For details visit [email or web address].   Also download the MyChart app! Go to the app store, search "MyChart", open the app, select [facility], and log in with your MyChart username and password.  Due to Covid, a mask is required upon entering the hospital/clinic. If you do not have a mask, one will be given to you upon arrival. For doctor visits, patients may have 1 support person aged 18 or older with them. For treatment visits, patients cannot have anyone with them due to current Covid guidelines and our immunocompromised population.  

## 2021-06-25 NOTE — Progress Notes (Signed)
Pt in for follow up and treatment today.  Weight down 3 lbs since 06/09/21 but pt states no change in appetite.

## 2021-06-25 NOTE — Progress Notes (Signed)
[facility] NOTE  Patient Care Team: Sofie Hartigan, MD as PCP - General (Family Medicine) Cammie Sickle, MD as Consulting Physician (Hematology and Oncology)  CHIEF COMPLAINTS/PURPOSE OF CONSULTATION: Colon cancer  #  Oncology History Overview Note  # MAY 2020- 3. 03/09/19 Liver biopsy. Microscopic examination shows malignant cells with glandular architecture consistent with adenocarcinoma. The malignant cells are positive for CK20 and CDX-2. These findings support the clinical impression of metastatic colon adenocarcinoma. 4. 03/10/19 R hemicolectomy. Tumor site cecum. Adenocarcinoma. Mucinous features present. G2. No tumor deposits. Invades visceral peritoneum. No tumor perforation. LVI present. PNI not identified. All margins uninvolved. 1/12 LNs. PT4apN1. Periappendiceal inflammation c/w resolving abscess. Microsatellite stable (MSS). [Dr.Mettu; [facility]]  # SEP 4th 2020 [compared to May 2020]  Interval increase in size of the metastases to the hepatic dome, The metastasis to the left hepatic lobe is unchanged; 2.  New subcentimeter hypoattenuating lesion in the inferior right hepatic lobe, incompletely characterized on CT. 3.  Postsurgical changes following right hemicolectomy.  # OCT 2020- FOLFOX +avastin; CT dec 22nd 2020- [compared to [facility] sep 9th 2020]-Liver- slight progression versus stable disease; CT scan SEP 4th 2021- Progressive disease-left lower lobe lung nodule 8 mm [previously 4 mm]; increase in size of the hepatic metastases by few millimeters.;  Soft tissue nodule adjacent to anastomotic site again increased by few millimeters.STOP FOLFOX; cont avastin  #  SEP 20th, 2021- FOLFIRI+ AVASTIN; HOLD FEB 2022- sec to poor tolerance [peptic ulcer]; 3/30-liver ablation-   [bland embolization of segment 2/3 liver lesion on 12/19/2020;  microwave ablation of both liver lesions.  # July 25th, 2022-   # FEB /16-EGD [Dr.Anna-duodenal ulcer-? meloxicam]  #  JAN 2022- COVID [s/p Mab infusion; pills; skin rash resolved.]  # NGS/F-ONE-MUTATED K-RAS [G]  # PALLIATIVE CARE EVALUATION: 09/20/2019-[name]  # PAIN MANAGEMENT: NA   DIAGNOSIS: COLON CANCER  STAGE:  IV     ;  GOALS:Palliative  CURRENT/MOST RECENT THERAPY : FOLFIRI+ avastin [C]    Cancer of right colon ([place])  07/05/2019 Initial Diagnosis   Cancer of right colon ([place])   07/24/2019 - 06/25/2020 Chemotherapy   The patient had dexamethasone (DECADRON) 4 MG tablet, 8 mg, Oral, Daily, 1 of 1 cycle, Start date: --, End date: -- palonosetron (ALOXI) injection 0.25 mg, 0.25 mg, Intravenous,  Once, 23 of 25 cycles Administration: 0.25 mg (07/24/2019), 0.25 mg (08/07/2019), 0.25 mg (08/21/2019), 0.25 mg (09/04/2019), 0.25 mg (09/20/2019), 0.25 mg (10/04/2019), 0.25 mg (10/16/2019), 0.25 mg (10/30/2019), 0.25 mg (11/22/2019), 0.25 mg (12/06/2019), 0.25 mg (12/20/2019), 0.25 mg (01/03/2020), 0.25 mg (01/29/2020), 0.25 mg (02/12/2020), 0.25 mg (02/26/2020), 0.25 mg (03/11/2020), 0.25 mg (03/25/2020), 0.25 mg (04/08/2020), 0.25 mg (04/24/2020), 0.25 mg (05/08/2020), 0.25 mg (05/22/2020), 0.25 mg (06/10/2020), 0.25 mg (06/25/2020) leucovorin 800 mg in dextrose 5 % 250 mL infusion, 844 mg, Intravenous,  Once, 23 of 25 cycles Administration: 800 mg (07/24/2019), 800 mg (08/07/2019), 800 mg (08/21/2019), 800 mg (09/04/2019), 800 mg (09/20/2019), 800 mg (10/04/2019), 800 mg (10/16/2019), 800 mg (10/30/2019), 800 mg (11/22/2019), 800 mg (12/06/2019), 800 mg (12/20/2019), 800 mg (01/03/2020), 800 mg (01/29/2020), 800 mg (02/12/2020), 800 mg (02/26/2020), 800 mg (03/11/2020), 800 mg (03/25/2020), 800 mg (04/08/2020), 800 mg (04/24/2020), 800 mg (05/08/2020), 800 mg (05/22/2020), 800 mg (06/10/2020), 800 mg (06/25/2020) oxaliplatin (ELOXATIN) 180 mg in dextrose 5 % 500 mL chemo infusion, 85 mg/m2 = 180 mg, Intravenous,  Once, 23 of 25 cycles Dose modification: 178 mg (original dose 85 mg/m2, Cycle 17, Reason:  Other (see comments), Comment: insurance adjusted dose  ) Administration: 180 mg (07/24/2019), 180 mg (08/07/2019), 180 mg (08/21/2019), 180 mg (09/04/2019), 180 mg (09/20/2019), 180 mg (10/04/2019), 180 mg (10/16/2019), 180 mg (10/30/2019), 180 mg (11/22/2019), 180 mg (12/06/2019), 180 mg (12/20/2019), 180 mg (01/03/2020), 180 mg (01/29/2020), 180 mg (02/12/2020), 180 mg (02/26/2020), 180 mg (03/11/2020), 180 mg (04/08/2020), 180 mg (04/24/2020), 180 mg (05/08/2020), 180 mg (05/22/2020), 180 mg (06/10/2020), 180 mg (06/25/2020) fluorouracil (ADRUCIL) 5,000 mg in sodium chloride 0.9 % 150 mL chemo infusion, 5,050 mg, Intravenous, 1 Day/Dose, 23 of 25 cycles Administration: 5,000 mg (07/24/2019), 5,000 mg (08/07/2019), 5,000 mg (08/21/2019), 5,050 mg (09/04/2019), 5,000 mg (10/04/2019), 5,000 mg (10/16/2019), 5,000 mg (11/22/2019), 5,000 mg (12/06/2019), 5,000 mg (12/20/2019), 5,000 mg (01/03/2020), 5,000 mg (01/29/2020), 5,000 mg (02/12/2020), 5,000 mg (02/26/2020), 5,000 mg (03/11/2020), 5,000 mg (03/25/2020), 5,000 mg (04/08/2020), 5,000 mg (04/24/2020), 5,000 mg (05/08/2020), 5,000 mg (05/22/2020), 5,000 mg (06/10/2020), 5,000 mg (06/25/2020) bevacizumab-bvzr (ZIRABEV) 400 mg in sodium chloride 0.9 % 100 mL chemo infusion, 5 mg/kg = 400 mg, Intravenous,  Once, 23 of 25 cycles Administration: 400 mg (08/07/2019), 400 mg (08/21/2019), 400 mg (09/04/2019), 400 mg (09/20/2019), 400 mg (10/04/2019), 400 mg (10/16/2019), 400 mg (10/30/2019), 400 mg (11/22/2019), 400 mg (12/06/2019), 400 mg (12/20/2019), 400 mg (01/03/2020), 400 mg (01/29/2020), 400 mg (02/12/2020), 400 mg (02/26/2020), 400 mg (03/11/2020), 400 mg (03/25/2020), 400 mg (04/08/2020), 400 mg (04/24/2020), 400 mg (05/08/2020), 400 mg (05/22/2020), 400 mg (06/10/2020), 400 mg (06/25/2020)   for chemotherapy treatment.     07/08/2020 -  Chemotherapy    Patient is on Treatment Plan: COLORECTAL FOLFIRI / BEVACIZUMAB Q14D       11/05/2020 Cancer Staging   Staging form: Colon and Rectum, AJCC 8th Edition - Clinical: Stage IVC (pM1c) - Signed by Cammie Sickle, MD  on 11/05/2020     HISTORY OF PRESENTING ILLNESS: Alone.  Ambulating independently. Oscar [name] 80 y.o.  male with metastatic colon cancer to the liver- -currently on bevacizumab plus Lonsurf is here for follow-up.  Lonsurf was held 2 weeks ago because of severe neutropenia.   Patient denies any nausea vomiting but no fever chills.  No sores in the mouth.  Review of Systems  Constitutional:  Positive for malaise/fatigue. Negative for chills, diaphoresis and fever.  HENT:  Negative for nosebleeds and sore throat.   Eyes:  Negative for double vision.  Respiratory:  Negative for cough, hemoptysis, sputum production, shortness of breath and wheezing.   Cardiovascular:  Negative for chest pain, palpitations, orthopnea and leg swelling.  Gastrointestinal:  Negative for blood in stool, constipation, heartburn, melena, nausea and vomiting.  Genitourinary:  Negative for dysuria, frequency and urgency.  Musculoskeletal:  Positive for back pain and joint pain.  Skin: Negative.  Negative for itching and rash.  Neurological:  Positive for tingling. Negative for focal weakness, weakness and headaches.  Endo/Heme/Allergies:  Does not bruise/bleed easily.  Psychiatric/Behavioral:  Negative for depression. The patient is not nervous/anxious and does not have insomnia.     MEDICAL HISTORY:  Past Medical History:  Diagnosis Date  . Arthritis   . Cancer ([place])    colon cancer 02/2019 per pt   . Family history of adverse reaction to anesthesia    cousin took all night to wake up from anesthesia  . H/O colon cancer, stage IV   . Hyperlipemia   . Neuromuscular disorder ([place])   . Pre-diabetes     SURGICAL HISTORY: Past Surgical History:  Procedure Laterality Date  .  COLON SURGERY    . ESOPHAGOGASTRODUODENOSCOPY (EGD) WITH PROPOFOL N/A 12/26/2020   Procedure: ESOPHAGOGASTRODUODENOSCOPY (EGD) WITH PROPOFOL;  Surgeon: [name], MD;  Location: [facility] ENDOSCOPY;  Service: Gastroenterology;   Laterality: N/A;  . IR ANGIOGRAM SELECTIVE EACH ADDITIONAL VESSEL  12/19/2020  . IR ANGIOGRAM SELECTIVE EACH ADDITIONAL VESSEL  03/24/2021  . IR ANGIOGRAM SELECTIVE EACH ADDITIONAL VESSEL  03/24/2021  . IR ANGIOGRAM SELECTIVE EACH ADDITIONAL VESSEL  04/04/2021  . IR ANGIOGRAM SELECTIVE EACH ADDITIONAL VESSEL  04/04/2021  . IR ANGIOGRAM SELECTIVE EACH ADDITIONAL VESSEL  04/04/2021  . IR ANGIOGRAM VISCERAL SELECTIVE  12/19/2020  . IR ANGIOGRAM VISCERAL SELECTIVE  03/24/2021  . IR ANGIOGRAM VISCERAL SELECTIVE  04/04/2021  . IR ANGIOGRAM VISCERAL SELECTIVE  04/04/2021  . IR EMBO ARTERIAL NOT HEMORR HEMANG INC GUIDE ROADMAPPING  03/24/2021  . IR EMBO TUMOR ORGAN ISCHEMIA INFARCT INC GUIDE ROADMAPPING  12/19/2020  . IR EMBO TUMOR ORGAN ISCHEMIA INFARCT INC GUIDE ROADMAPPING  04/04/2021  . IR IMAGING GUIDED PORT INSERTION  07/20/2019  . IR RADIOLOGIST EVAL & MGMT  12/05/2020  . IR RADIOLOGIST EVAL & MGMT  02/11/2021  . IR RADIOLOGIST EVAL & MGMT  03/04/2021  . IR RADIOLOGIST EVAL & MGMT  04/29/2021  . IR US GUIDE VASC ACCESS RIGHT  12/19/2020  . IR US GUIDE VASC ACCESS RIGHT  03/24/2021  . IR US GUIDE VASC ACCESS RIGHT  04/04/2021  . JOINT REPLACEMENT Left 2010   Knee  . RADIOLOGY WITH ANESTHESIA N/A 01/15/2021   Procedure: CT WITH ANESTHESIA MICROWAVE ABLATION OF LIVER;  Surgeon: Criselda Peaches, MD;  Location: [facility] ORS;  Service: Anesthesiology;  Laterality: N/A;    SOCIAL HISTORY: Social History   Socioeconomic History  . Marital status: Married    Spouse name: Not on file  . Number of children: Not on file  . Years of education: Not on file  . Highest education level: Not on file  Occupational History  . Not on file  Tobacco Use  . Smoking status: Every Day    Packs/day: 0.25    Years: 15.00    Pack years: 3.75    Types: Cigarettes  . Smokeless tobacco: Never  . Tobacco comments:    1 to 2 cigarettes a day occasionally  Vaping Use  . Vaping Use: Never used  Substance and Sexual Activity  . Alcohol  use: Yes    Comment: rarely   . Drug use: No  . Sexual activity: Not on file  Other Topics Concern  . Not on file  Social History Narrative   Recruitment consultant retd; lives in [place]; smoking 3cig/day; [3/4 ppd x started at 9 years]; no alcohol. Son & daughter; wife dementia [waiting for placement].    Social Determinants of Health   Financial Resource Strain: Not on file  Food Insecurity: Not on file  Transportation Needs: Not on file  Physical Activity: Not on file  Stress: Not on file  Social Connections: Not on file  Intimate Partner Violence: Not on file    FAMILY HISTORY: Family History  Problem Relation Age of Onset  . Peptic Ulcer Disease Father     ALLERGIES:  is allergic to ace inhibitors.  MEDICATIONS:  Current Outpatient Medications  Medication Sig Dispense Refill  . amLODipine (NORVASC) 5 MG tablet Take 5 mg by mouth daily.    Marland Kitchen oxyCODONE (OXY IR/ROXICODONE) 5 MG immediate release tablet Take 1 tablet (5 mg total) by mouth every 6 (six) hours as needed for severe pain.  60 tablet 0  . sucralfate (CARAFATE) 1 g tablet Take 1 tablet (1 g total) by mouth 4 (four) times daily -  with meals and at bedtime. 90 tablet 3  . ondansetron (ZOFRAN) 4 MG tablet Take 1 tablet (4 mg total) by mouth every 8 (eight) hours as needed for nausea or vomiting. (Patient not taking: No sig reported) 20 tablet 0  . ondansetron (ZOFRAN) 8 MG tablet Take 1 tablet (8 mg total) by mouth every 8 (eight) hours as needed for nausea or vomiting. (Patient not taking: No sig reported) 20 tablet 0  . pravastatin (PRAVACHOL) 40 MG tablet Take 40 mg by mouth daily.  (Patient not taking: No sig reported)    . prochlorperazine (COMPAZINE) 10 MG tablet Take 1 tablet (10 mg total) by mouth every 6 (six) hours as needed for nausea or vomiting. (Patient not taking: No sig reported) 60 tablet 0  . tamsulosin (FLOMAX) 0.4 MG CAPS capsule Take 1 capsule (0.4 mg total) by mouth daily. (Patient not  taking: Reported on 06/25/2021) 30 capsule 11  . trifluridine-tipiracil (LONSURF) 15-6.14 MG tablet Take 5 tablets (75 mg of trifluridine total) by mouth 2 (two) times daily after a meal. 1 hr after AM & PM meals on days 1-5, 8-12. Repeat every 28day (Patient not taking: Reported on 06/25/2021) 100 tablet    No current facility-administered medications for this visit.   Facility-Administered Medications Ordered in Other Visits  Medication Dose Route Frequency Provider Last Rate Last Admin  . heparin lock flush 100 unit/mL  500 Units Intracatheter Once PRN Cammie Sickle, MD          .  PHYSICAL EXAMINATION: ECOG PERFORMANCE STATUS: 0 - Asymptomatic  Vitals:   06/25/21 1130  BP: 139/78  Pulse: 62  Resp: 16  Temp: 98 F (36.7 C)  SpO2: 100%   Filed Weights   06/25/21 1130  Weight: 180 lb (81.6 kg)    Physical Exam HENT:     Head: Normocephalic and atraumatic.     Mouth/Throat:     Pharynx: No oropharyngeal exudate.     Comments: Whitish patches noted right side.  Eyes:     Pupils: Pupils are equal, round, and reactive to light.  Cardiovascular:     Rate and Rhythm: Normal rate and regular rhythm.  Pulmonary:     Effort: No respiratory distress.     Breath sounds: No wheezing.     Comments: Decreased breath sounds bilaterally.  No wheeze or crackles Abdominal:     General: Bowel sounds are normal. There is no distension.     Palpations: Abdomen is soft. There is no mass.     Tenderness: no abdominal tenderness There is no guarding or rebound.  Musculoskeletal:        General: No tenderness. Normal range of motion.     Cervical back: Normal range of motion and neck supple.  Skin:    General: Skin is warm.  Neurological:     Mental Status: He is alert and oriented to person, place, and time.  Psychiatric:        Mood and Affect: Affect normal.   LABORATORY DATA:  I have reviewed the data as listed Lab Results  Component Value Date   WBC 3.8 (L) 06/25/2021    HGB 11.1 (L) 06/25/2021   HCT 33.2 (L) 06/25/2021   MCV 100.9 (H) 06/25/2021   PLT 141 (L) 06/25/2021   Recent Labs    07/08/20 0843 07/22/20 0836 08/05/20  5694 05/26/21 0822 06/09/21 0830 06/25/21 1024  NA 139 138   < > 136 136 135  K 3.6 3.6   < > 3.6 3.6 3.7  CL 108 107   < > 105 105 105  CO2 25 25   < > _0 GLUCOSE 209* 195*   < > 181* 211* 195*  BUN 9 10   < > _1 CREATININE 0.70 0.89   < > 0.81 0.89 0.84  CALCIUM 8.0* 8.1*   < > 8.2* 8.0* 8.3*  GFRNONAA >60 >60   < > >60 >60 >60  GFRAA >60 >60  --   --   --   --   PROT 6.5 6.7   < > 7.1 7.2 7.7  ALBUMIN 3.1* 3.1*   < > 3.1* 2.9* 3.1*  AST 31 29   < > 35 34 35  ALT 19 19   < > _2 ALKPHOS 102 108   < > 193* 192* 173*  BILITOT 0.7 0.8   < > 1.0 1.1 0.7   < > = values in this interval not displayed.    RADIOGRAPHIC STUDIES: I have personally reviewed the radiological images as listed and agreed with the findings in the report. No results found.   ASSESSMENT & PLAN:   Cancer of right colon ([place]) #Right-sided colon adenocarcinoma-with synchronous metastasis to liver/unresectable.  Most recently s/p  FOLFIRI plus Avastin on HOLD-given local therapies to liver. s/p Ablation- MAY 2022 MRI- Multiple lvier lesions/progression-patient underwent s/p Y 90 on 06/17; awaiting repeat MRI in late sep, 2022 [IR].  Currently on oral TAS-102 (35 mg/m2 twice daily on days 1-5 and 8-12 every 28 days].   # proceed with cycle #2 day-15 - Zirabev. Labs today reviewed;  acceptable for treatment today; except UA- 100 protein. HOLD cycle #2 of TAS 102 [given the recent neutropenia]; will start back with cycle #3/in 12 days.  # UA protein: 100- check Urine protein ratio today; Protein Creatinine Ratio: 0.05- STABLE  # PN- G-1-2; sec to oxaliplatin- STABLE.   # Mediport- functioning- STABLE.   # Shoulder pain-refill oxycodone prn [[name] ]-STABLE.  Okay to  # DISPOSITION:   # zirabev today.  # follow up on 9/19-- MD;  labs- cbc/cmp CEA/UA; zirabev-Dr.B   All questions were answered. The patient knows to call the clinic with any problems, questions or concerns.    Cammie Sickle, MD 06/25/2021 1:43 PM

## 2021-06-26 LAB — CEA: CEA: 84.1 ng/mL — ABNORMAL HIGH (ref 0.0–4.7)

## 2021-06-30 ENCOUNTER — Inpatient Hospital Stay (HOSPITAL_BASED_OUTPATIENT_CLINIC_OR_DEPARTMENT_OTHER): Payer: Medicare HMO | Admitting: Hospice and Palliative Medicine

## 2021-06-30 DIAGNOSIS — Z515 Encounter for palliative care: Secondary | ICD-10-CM | POA: Diagnosis not present

## 2021-06-30 DIAGNOSIS — G893 Neoplasm related pain (acute) (chronic): Secondary | ICD-10-CM

## 2021-06-30 DIAGNOSIS — C182 Malignant neoplasm of ascending colon: Secondary | ICD-10-CM | POA: Diagnosis not present

## 2021-06-30 MED ORDER — OXYCODONE HCL 5 MG PO TABS: 5.0000 mg | ORAL_TABLET | Freq: Four times a day (QID) | ORAL | 0 refills | Status: DC | PRN

## 2021-06-30 NOTE — Progress Notes (Signed)
Virtual Visit via Telephone Note  I connected with Oscar [name] on 06/30/21 at 10:30 AM EDT by telephone and verified that I am speaking with the correct person using two identifiers.  Location: Patient: Home Provider: Clinic   I discussed the limitations, risks, security and privacy concerns of performing an evaluation and management service by telephone and the availability of in person appointments. I also discussed with the patient that there may be a patient responsible charge related to this service. The patient expressed understanding and agreed to proceed.   History of Present Illness: [name] is a 80 y.o. male with multiple medical problems including stage IV colon cancer s/p FOLFIRI plus Avastin chemotherapy now on oral TAS-102.  Patient underwent embolization to liver lesions on 01/15/2021.  He is status post Y 90 to liver lesions on 04/04/2021.Patient was referred to palliative care to help address goals and manage ongoing symptoms.   Observations/Objective: Spoke with patient by phone.  He reports doing well.  He denies any significant changes or concerns.  He reports stable performance status and still golfing regularly.  Appetite is good.  Patient continues to endorse occasional abdominal pain but says that it is completely relieved when he takes oxycodone.  He is not utilizing oxycodone daily but when he does take it he often takes it once or twice a day.  No adverse effects or constipation reported.  Patient did request refill of oxycodone.  Assessment and Plan: Metastatic colon cancer -status post liver embolization/Y 90.  Symptomatically appears to be doing well.  Patient is pending repeat MRI later this month.  Neoplasm related pain -refill oxycodone #60  Follow Up Instructions: Follow-up MyChart visit 1-2 months   I discussed the assessment and treatment plan with the patient. The patient was provided an opportunity to ask questions and all were answered.  The patient agreed with the plan and demonstrated an understanding of the instructions.   The patient was advised to call back or seek an in-person evaluation if the symptoms worsen or if the condition fails to improve as anticipated.  I provided 5 minutes of non-face-to-face time during this encounter.   [name], NP

## 2021-07-07 ENCOUNTER — Inpatient Hospital Stay: Payer: Medicare HMO

## 2021-07-07 ENCOUNTER — Other Ambulatory Visit: Payer: Self-pay

## 2021-07-07 ENCOUNTER — Inpatient Hospital Stay: Payer: Medicare HMO | Admitting: Internal Medicine

## 2021-07-07 DIAGNOSIS — C787 Secondary malignant neoplasm of liver and intrahepatic bile duct: Secondary | ICD-10-CM | POA: Diagnosis not present

## 2021-07-07 DIAGNOSIS — C182 Malignant neoplasm of ascending colon: Secondary | ICD-10-CM | POA: Diagnosis not present

## 2021-07-07 DIAGNOSIS — Z7189 Other specified counseling: Secondary | ICD-10-CM

## 2021-07-07 DIAGNOSIS — Z5112 Encounter for antineoplastic immunotherapy: Secondary | ICD-10-CM | POA: Diagnosis not present

## 2021-07-07 LAB — URINALYSIS, COMPLETE (UACMP) WITH MICROSCOPIC
Glucose, UA: NEGATIVE mg/dL
Hgb urine dipstick: NEGATIVE
Ketones, ur: 15 mg/dL — AB
Leukocytes,Ua: NEGATIVE
Nitrite: POSITIVE — AB
Protein, ur: 30 mg/dL — AB
Specific Gravity, Urine: >1.03 — ABNORMAL HIGH (ref 1.005–1.030)
pH: 5.5 (ref 5.0–8.0)

## 2021-07-07 LAB — CBC WITH DIFFERENTIAL/PLATELET
Abs Immature Granulocytes: 0.01 10*3/uL (ref 0.00–0.07)
Basophils Absolute: 0.1 10*3/uL (ref 0.0–0.1)
Basophils Relative: 1 %
Eosinophils Absolute: 0.5 10*3/uL (ref 0.0–0.5)
Eosinophils Relative: 12 %
HCT: 36.1 % — ABNORMAL LOW (ref 39.0–52.0)
Hemoglobin: 11.9 g/dL — ABNORMAL LOW (ref 13.0–17.0)
Immature Granulocytes: 0 %
Lymphocytes Relative: 17 %
Lymphs Abs: 0.7 10*3/uL (ref 0.7–4.0)
MCH: 33.5 pg (ref 26.0–34.0)
MCHC: 33 g/dL (ref 30.0–36.0)
MCV: 101.7 fL — ABNORMAL HIGH (ref 80.0–100.0)
Monocytes Absolute: 0.4 10*3/uL (ref 0.1–1.0)
Monocytes Relative: 8 %
Neutro Abs: 2.6 10*3/uL (ref 1.7–7.7)
Neutrophils Relative %: 62 %
Platelets: 117 10*3/uL — ABNORMAL LOW (ref 150–400)
RBC: 3.55 MIL/uL — ABNORMAL LOW (ref 4.22–5.81)
RDW: 16.9 % — ABNORMAL HIGH (ref 11.5–15.5)
WBC: 4.2 10*3/uL (ref 4.0–10.5)
nRBC: 0 % (ref 0.0–0.2)

## 2021-07-07 LAB — COMPREHENSIVE METABOLIC PANEL
ALT: 16 U/L (ref 0–44)
AST: 35 U/L (ref 15–41)
Albumin: 3 g/dL — ABNORMAL LOW (ref 3.5–5.0)
Alkaline Phosphatase: 162 U/L — ABNORMAL HIGH (ref 38–126)
Anion gap: 7 (ref 5–15)
BUN: 14 mg/dL (ref 8–23)
CO2: 26 mmol/L (ref 22–32)
Calcium: 8.4 mg/dL — ABNORMAL LOW (ref 8.9–10.3)
Chloride: 104 mmol/L (ref 98–111)
Creatinine, Ser: 0.9 mg/dL (ref 0.61–1.24)
GFR, Estimated: >60 mL/min (ref >60)
Glucose, Bld: 194 mg/dL — ABNORMAL HIGH (ref 70–99)
Potassium: 3.8 mmol/L (ref 3.5–5.1)
Sodium: 137 mmol/L (ref 135–145)
Total Bilirubin: 1.1 mg/dL (ref 0.3–1.2)
Total Protein: 7.7 g/dL (ref 6.5–8.1)

## 2021-07-07 MED ORDER — HEPARIN SOD (PORK) LOCK FLUSH 100 UNIT/ML IV SOLN
500.0000 [IU] | Freq: Once | INTRAVENOUS | Status: AC | PRN
  Filled 2021-07-07: qty 5

## 2021-07-07 MED ORDER — SODIUM CHLORIDE 0.9 % IV SOLN
Freq: Once | INTRAVENOUS | Status: AC
  Filled 2021-07-07: qty 250

## 2021-07-07 MED ORDER — SODIUM CHLORIDE 0.9 % IV SOLN
5.0000 mg/kg | Freq: Once | INTRAVENOUS | Status: AC
  Administered 2021-07-07: 400 mg via INTRAVENOUS
  Filled 2021-07-07: qty 16

## 2021-07-07 MED ORDER — HEPARIN SOD (PORK) LOCK FLUSH 100 UNIT/ML IV SOLN
INTRAVENOUS | Status: AC
  Administered 2021-07-07: 500 [IU]
  Filled 2021-07-07: qty 5

## 2021-07-07 NOTE — Assessment & Plan Note (Addendum)
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   s/p Ablation- MAY 2022 MRI- Multiple lvier lesions/progression-patient underwent s/p Y 90 on 06/17; awaiting repeat MRI in late sep, 2022 [IR].  Currently on oral TAS-102 (*35 mg/m2 twice daily on days 1-5 and 8-12 every 28 days].    # proceed with cycle #3 day-1 - Zirabev. Labs today reviewed;  acceptable for treatment today; except UA- 100 protein. Proceed with  Edward Jolly the recent neutropenia]. Dose reduced to 4 [14x4=60] pills BID- M-F  # UA protein: 100- check Urine protein ratio today; Protein Creatinine Ratio: 0.05- STABLE  # PN- G-1-2; sec to oxaliplatin- STABLE  # Mediport- functioning- STABLE.   # Shoulder pain-refill oxycodone prn [facility].[place] ]- STABLE Okay to  # DISPOSITION:  # zirabev today.  # follow up in 2 weeks-  MD; labs- cbc/cmp CEA/UA; zirabev-Dr.B -----------------------------------   #Take 4 pills twice a day-Monday through Friday; weekends off x2 weeks. Discussed with Ebony Hail.

## 2021-07-07 NOTE — Patient Instructions (Signed)
[facility]  Discharge Instructions: Thank you for choosing [facility] to provide your oncology and hematology care.  If you have a lab appointment with the [facility], please go directly to the [facility] and check in at the registration area.  Wear comfortable clothing and clothing appropriate for easy access to any Portacath or PICC line.   We strive to give you quality time with your provider. You may need to reschedule your appointment if you arrive late (15 or more minutes).  Arriving late affects you and other patients whose appointments are after yours.  Also, if you miss three or more appointments without notifying the office, you may be dismissed from the clinic at the provider's discretion.      For prescription refill requests, have your pharmacy contact our office and allow 72 hours for refills to be completed.    Today you received the following chemotherapy and/or immunotherapy agents Zirabev      To help prevent nausea and vomiting after your treatment, we encourage you to take your nausea medication as directed.  BELOW ARE SYMPTOMS THAT SHOULD BE REPORTED IMMEDIATELY: *FEVER GREATER THAN 100.4 F (38 C) OR HIGHER *CHILLS OR SWEATING *NAUSEA AND VOMITING THAT IS NOT CONTROLLED WITH YOUR NAUSEA MEDICATION *UNUSUAL SHORTNESS OF BREATH *UNUSUAL BRUISING OR BLEEDING *URINARY PROBLEMS (pain or burning when urinating, or frequent urination) *BOWEL PROBLEMS (unusual diarrhea, constipation, pain near the anus) TENDERNESS IN MOUTH AND THROAT WITH OR WITHOUT PRESENCE OF ULCERS (sore throat, sores in mouth, or a toothache) UNUSUAL RASH, SWELLING OR PAIN  UNUSUAL VAGINAL DISCHARGE OR ITCHING   Items with * indicate a potential emergency and should be followed up as soon as possible or go to the Emergency Department if any problems should occur.  Please show the CHEMOTHERAPY ALERT CARD or IMMUNOTHERAPY ALERT CARD at check-in to  the Emergency Department and triage nurse.  Should you have questions after your visit or need to cancel or reschedule your appointment, please contact [facility]  [phone] and follow the prompts.  Office hours are 8:00 a.m. to 4:30 p.m. Monday - Friday. Please note that voicemails left after 4:00 p.m. may not be returned until the following business day.  We are closed weekends and major holidays. You have access to a nurse at all times for urgent questions. Please call the main number to the clinic [phone] and follow the prompts.  For any non-urgent questions, you may also contact your provider using MyChart. We now offer e-Visits for anyone 61 and older to request care online for non-urgent symptoms. For details visit [email or web address].   Also download the MyChart app! Go to the app store, search "MyChart", open the app, select [facility], and log in with your MyChart username and password.  Due to Covid, a mask is required upon entering the hospital/clinic. If you do not have a mask, one will be given to you upon arrival. For doctor visits, patients may have 1 support person aged 58 or older with them. For treatment visits, patients cannot have anyone with them due to current Covid guidelines and our immunocompromised population.

## 2021-07-07 NOTE — Progress Notes (Signed)
[facility] NOTE  Patient Care Team: Sofie Hartigan, MD as PCP - General (Family Medicine) Cammie Sickle, MD as Consulting Physician (Hematology and Oncology)  CHIEF COMPLAINTS/PURPOSE OF CONSULTATION: Colon cancer  #  Oncology History Overview Note  # MAY 2020- 3. 03/09/19 Liver biopsy. Microscopic examination shows malignant cells with glandular architecture consistent with adenocarcinoma. The malignant cells are positive for CK20 and CDX-2. These findings support the clinical impression of metastatic colon adenocarcinoma. 4. 03/10/19 R hemicolectomy. Tumor site cecum. Adenocarcinoma. Mucinous features present. G2. No tumor deposits. Invades visceral peritoneum. No tumor perforation. LVI present. PNI not identified. All margins uninvolved. 1/12 LNs. PT4apN1. Periappendiceal inflammation c/w resolving abscess. Microsatellite stable (MSS). [Dr.Mettu; [facility]]  # SEP 4th 2020 [compared to May 2020]  Interval increase in size of the metastases to the hepatic dome, The metastasis to the left hepatic lobe is unchanged; 2.  New subcentimeter hypoattenuating lesion in the inferior right hepatic lobe, incompletely characterized on CT. 3.  Postsurgical changes following right hemicolectomy.  # OCT 2020- FOLFOX +avastin; CT dec 22nd 2020- [compared to [facility] sep 9th 2020]-Liver- slight progression versus stable disease; CT scan SEP 4th 2021- Progressive disease-left lower lobe lung nodule 8 mm [previously 4 mm]; increase in size of the hepatic metastases by few millimeters.;  Soft tissue nodule adjacent to anastomotic site again increased by few millimeters.STOP FOLFOX; cont avastin  #  SEP 20th, 2021- FOLFIRI+ AVASTIN; HOLD FEB 2022- sec to poor tolerance [peptic ulcer]; 3/30-liver ablation-   [bland embolization of segment 2/3 liver lesion on 12/19/2020;  microwave ablation of both liver lesions.  # July 25th, 2022-   # FEB /16-EGD [Dr.Anna-duodenal ulcer-? meloxicam]  #  JAN 2022- COVID [s/p Mab infusion; pills; skin rash resolved.]  # NGS/F-ONE-MUTATED K-RAS [G]  # PALLIATIVE CARE EVALUATION: 09/20/2019-Oscar [name]  # PAIN MANAGEMENT: NA   DIAGNOSIS: COLON CANCER  STAGE:  IV     ;  GOALS:Palliative  CURRENT/MOST RECENT THERAPY : FOLFIRI+ avastin [C]    Cancer of right colon ([place])  07/05/2019 Initial Diagnosis   Cancer of right colon ([place])   07/24/2019 - 06/25/2020 Chemotherapy   The patient had dexamethasone (DECADRON) 4 MG tablet, 8 mg, Oral, Daily, 1 of 1 cycle, Start date: --, End date: -- palonosetron (ALOXI) injection 0.25 mg, 0.25 mg, Intravenous,  Once, 23 of 25 cycles Administration: 0.25 mg (07/24/2019), 0.25 mg (08/07/2019), 0.25 mg (08/21/2019), 0.25 mg (09/04/2019), 0.25 mg (09/20/2019), 0.25 mg (10/04/2019), 0.25 mg (10/16/2019), 0.25 mg (10/30/2019), 0.25 mg (11/22/2019), 0.25 mg (12/06/2019), 0.25 mg (12/20/2019), 0.25 mg (01/03/2020), 0.25 mg (01/29/2020), 0.25 mg (02/12/2020), 0.25 mg (02/26/2020), 0.25 mg (03/11/2020), 0.25 mg (03/25/2020), 0.25 mg (04/08/2020), 0.25 mg (04/24/2020), 0.25 mg (05/08/2020), 0.25 mg (05/22/2020), 0.25 mg (06/10/2020), 0.25 mg (06/25/2020) leucovorin 800 mg in dextrose 5 % 250 mL infusion, 844 mg, Intravenous,  Once, 23 of 25 cycles Administration: 800 mg (07/24/2019), 800 mg (08/07/2019), 800 mg (08/21/2019), 800 mg (09/04/2019), 800 mg (09/20/2019), 800 mg (10/04/2019), 800 mg (10/16/2019), 800 mg (10/30/2019), 800 mg (11/22/2019), 800 mg (12/06/2019), 800 mg (12/20/2019), 800 mg (01/03/2020), 800 mg (01/29/2020), 800 mg (02/12/2020), 800 mg (02/26/2020), 800 mg (03/11/2020), 800 mg (03/25/2020), 800 mg (04/08/2020), 800 mg (04/24/2020), 800 mg (05/08/2020), 800 mg (05/22/2020), 800 mg (06/10/2020), 800 mg (06/25/2020) oxaliplatin (ELOXATIN) 180 mg in dextrose 5 % 500 mL chemo infusion, 85 mg/m2 = 180 mg, Intravenous,  Once, 23 of 25 cycles Dose modification: 178 mg (original dose 85 mg/m2, Cycle 17, Reason:  Other (see comments), Comment: insurance adjusted dose  ) Administration: 180 mg (07/24/2019), 180 mg (08/07/2019), 180 mg (08/21/2019), 180 mg (09/04/2019), 180 mg (09/20/2019), 180 mg (10/04/2019), 180 mg (10/16/2019), 180 mg (10/30/2019), 180 mg (11/22/2019), 180 mg (12/06/2019), 180 mg (12/20/2019), 180 mg (01/03/2020), 180 mg (01/29/2020), 180 mg (02/12/2020), 180 mg (02/26/2020), 180 mg (03/11/2020), 180 mg (04/08/2020), 180 mg (04/24/2020), 180 mg (05/08/2020), 180 mg (05/22/2020), 180 mg (06/10/2020), 180 mg (06/25/2020) fluorouracil (ADRUCIL) 5,000 mg in sodium chloride 0.9 % 150 mL chemo infusion, 5,050 mg, Intravenous, 1 Day/Dose, 23 of 25 cycles Administration: 5,000 mg (07/24/2019), 5,000 mg (08/07/2019), 5,000 mg (08/21/2019), 5,050 mg (09/04/2019), 5,000 mg (10/04/2019), 5,000 mg (10/16/2019), 5,000 mg (11/22/2019), 5,000 mg (12/06/2019), 5,000 mg (12/20/2019), 5,000 mg (01/03/2020), 5,000 mg (01/29/2020), 5,000 mg (02/12/2020), 5,000 mg (02/26/2020), 5,000 mg (03/11/2020), 5,000 mg (03/25/2020), 5,000 mg (04/08/2020), 5,000 mg (04/24/2020), 5,000 mg (05/08/2020), 5,000 mg (05/22/2020), 5,000 mg (06/10/2020), 5,000 mg (06/25/2020) bevacizumab-bvzr (ZIRABEV) 400 mg in sodium chloride 0.9 % 100 mL chemo infusion, 5 mg/kg = 400 mg, Intravenous,  Once, 23 of 25 cycles Administration: 400 mg (08/07/2019), 400 mg (08/21/2019), 400 mg (09/04/2019), 400 mg (09/20/2019), 400 mg (10/04/2019), 400 mg (10/16/2019), 400 mg (10/30/2019), 400 mg (11/22/2019), 400 mg (12/06/2019), 400 mg (12/20/2019), 400 mg (01/03/2020), 400 mg (01/29/2020), 400 mg (02/12/2020), 400 mg (02/26/2020), 400 mg (03/11/2020), 400 mg (03/25/2020), 400 mg (04/08/2020), 400 mg (04/24/2020), 400 mg (05/08/2020), 400 mg (05/22/2020), 400 mg (06/10/2020), 400 mg (06/25/2020)   for chemotherapy treatment.     07/08/2020 -  Chemotherapy    Patient is on Treatment Plan: COLORECTAL FOLFIRI / BEVACIZUMAB Q14D       11/05/2020 Cancer Staging   Staging form: Colon and Rectum, AJCC 8th Edition - Clinical: Stage IVC (pM1c) - Signed by Cammie Sickle, MD  on 11/05/2020     HISTORY OF PRESENTING ILLNESS: Alone.  Ambulating independently. Oscar [name] 80 y.o.  male with metastatic colon cancer to the liver- -currently on bevacizumab plus Lonsurf is here for follow-up.  Lonsurf was held 4 weeks ago because of severe neutropenia [at the time patient was taking 3 pills twice a day-M-F] .  No nausea no vomiting no fever no chills.  No sores in the mouth.  No diarrhea.  Review of Systems  Constitutional:  Positive for malaise/fatigue. Negative for chills, diaphoresis and fever.  HENT:  Negative for nosebleeds and sore throat.   Eyes:  Negative for double vision.  Respiratory:  Negative for cough, hemoptysis, sputum production, shortness of breath and wheezing.   Cardiovascular:  Negative for chest pain, palpitations, orthopnea and leg swelling.  Gastrointestinal:  Negative for blood in stool, constipation, heartburn, melena, nausea and vomiting.  Genitourinary:  Negative for dysuria, frequency and urgency.  Musculoskeletal:  Positive for back pain and joint pain.  Skin: Negative.  Negative for itching and rash.  Neurological:  Positive for tingling. Negative for focal weakness, weakness and headaches.  Endo/Heme/Allergies:  Does not bruise/bleed easily.  Psychiatric/Behavioral:  Negative for depression. The patient is not nervous/anxious and does not have insomnia.     MEDICAL HISTORY:  Past Medical History:  Diagnosis Date   Arthritis    Cancer ([place])    colon cancer 02/2019 per pt    Family history of adverse reaction to anesthesia    cousin took all night to wake up from anesthesia   H/O colon cancer, stage IV    Hyperlipemia    Neuromuscular disorder ([place])    Pre-diabetes  SURGICAL HISTORY: Past Surgical History:  Procedure Laterality Date   COLON SURGERY     ESOPHAGOGASTRODUODENOSCOPY (EGD) WITH PROPOFOL N/A 12/26/2020   Procedure: ESOPHAGOGASTRODUODENOSCOPY (EGD) WITH PROPOFOL;  Surgeon: [name], MD;  Location[facility]  ENDOSCOPY;  Service: Gastroenterology;  Laterality: N/A;   IR ANGIOGRAM SELECTIVE EACH ADDITIONAL VESSEL  12/19/2020   IR ANGIOGRAM SELECTIVE EACH ADDITIONAL VESSEL  03/24/2021   IR ANGIOGRAM SELECTIVE EACH ADDITIONAL VESSEL  03/24/2021   IR ANGIOGRAM SELECTIVE EACH ADDITIONAL VESSEL  04/04/2021   IR ANGIOGRAM SELECTIVE EACH ADDITIONAL VESSEL  04/04/2021   IR ANGIOGRAM SELECTIVE EACH ADDITIONAL VESSEL  04/04/2021   IR ANGIOGRAM VISCERAL SELECTIVE  12/19/2020   IR ANGIOGRAM VISCERAL SELECTIVE  03/24/2021   IR ANGIOGRAM VISCERAL SELECTIVE  04/04/2021   IR ANGIOGRAM VISCERAL SELECTIVE  04/04/2021   IR EMBO ARTERIAL NOT HEMORR HEMANG INC GUIDE ROADMAPPING  03/24/2021   IR EMBO TUMOR ORGAN ISCHEMIA INFARCT INC GUIDE ROADMAPPING  12/19/2020   IR EMBO TUMOR ORGAN ISCHEMIA INFARCT INC GUIDE ROADMAPPING  04/04/2021   IR IMAGING GUIDED PORT INSERTION  07/20/2019   IR RADIOLOGIST EVAL & MGMT  12/05/2020   IR RADIOLOGIST EVAL & MGMT  02/11/2021   IR RADIOLOGIST EVAL & MGMT  03/04/2021   IR RADIOLOGIST EVAL & MGMT  04/29/2021   IR US GUIDE VASC ACCESS RIGHT  12/19/2020   IR US GUIDE VASC ACCESS RIGHT  03/24/2021   IR US GUIDE VASC ACCESS RIGHT  04/04/2021   JOINT REPLACEMENT Left 2010   Knee   RADIOLOGY WITH ANESTHESIA N/A 01/15/2021   Procedure: CT WITH ANESTHESIA MICROWAVE ABLATION OF LIVER;  Surgeon: Criselda Peaches, MD;  Location: [facility];  Service: Anesthesiology;  Laterality: N/A;    SOCIAL HISTORY: Social History   Socioeconomic History   Marital status: Married    Spouse name: Not on file   Number of children: Not on file   Years of education: Not on file   Highest education level: Not on file  Occupational History   Not on file  Tobacco Use   Smoking status: Every Day    Packs/day: 0.25    Years: 15.00    Pack years: 3.75    Types: Cigarettes   Smokeless tobacco: Never   Tobacco comments:    1 to 2 cigarettes a day occasionally  Vaping Use   Vaping Use: Never used  Substance and Sexual Activity    Alcohol use: Yes    Comment: rarely    Drug use: No   Sexual activity: Not on file  Other Topics Concern   Not on file  Social History Narrative   Recruitment consultant retd; lives in [place]; smoking 3cig/day; [3/4 ppd x started at 9 years]; no alcohol. Son & daughter; wife dementia [waiting for placement].    Social Determinants of Health   Financial Resource Strain: Not on file  Food Insecurity: Not on file  Transportation Needs: Not on file  Physical Activity: Not on file  Stress: Not on file  Social Connections: Not on file  Intimate Partner Violence: Not on file    FAMILY HISTORY: Family History  Problem Relation Age of Onset   Peptic Ulcer Disease Father     ALLERGIES:  is allergic to ace inhibitors.  MEDICATIONS:  Current Outpatient Medications  Medication Sig Dispense Refill   amLODipine (NORVASC) 5 MG tablet Take 5 mg by mouth daily.     ondansetron (ZOFRAN) 4 MG tablet Take 1 tablet (4 mg total) by mouth every  8 (eight) hours as needed for nausea or vomiting. (Patient not taking: No sig reported) 20 tablet 0   ondansetron (ZOFRAN) 8 MG tablet Take 1 tablet (8 mg total) by mouth every 8 (eight) hours as needed for nausea or vomiting. (Patient not taking: No sig reported) 20 tablet 0   oxyCODONE (OXY IR/ROXICODONE) 5 MG immediate release tablet Take 1 tablet (5 mg total) by mouth every 6 (six) hours as needed for severe pain. 60 tablet 0   pravastatin (PRAVACHOL) 40 MG tablet Take 40 mg by mouth daily.  (Patient not taking: No sig reported)     prochlorperazine (COMPAZINE) 10 MG tablet Take 1 tablet (10 mg total) by mouth every 6 (six) hours as needed for nausea or vomiting. (Patient not taking: No sig reported) 60 tablet 0   sucralfate (CARAFATE) 1 g tablet Take 1 tablet (1 g total) by mouth 4 (four) times daily -  with meals and at bedtime. 90 tablet 3   tamsulosin (FLOMAX) 0.4 MG CAPS capsule Take 1 capsule (0.4 mg total) by mouth daily. (Patient not taking:  Reported on 06/25/2021) 30 capsule 11   trifluridine-tipiracil (LONSURF) 15-6.14 MG tablet Take 5 tablets (75 mg of trifluridine total) by mouth 2 (two) times daily after a meal. 1 hr after AM & PM meals on days 1-5, 8-12. Repeat every 28day (Patient not taking: Reported on 06/25/2021) 100 tablet    No current facility-administered medications for this visit.      Marland Kitchen  PHYSICAL EXAMINATION: ECOG PERFORMANCE STATUS: 0 - Asymptomatic  Vitals:   07/07/21 0918  BP: 129/72  Pulse: 68  Resp: 20  Temp: (!) 97.2 F (36.2 C)   Filed Weights   07/07/21 0918  Weight: 181 lb 8 oz (82.3 kg)    Physical Exam HENT:     Head: Normocephalic and atraumatic.     Mouth/Throat:     Pharynx: No oropharyngeal exudate.     Comments: Whitish patches noted right side.  Eyes:     Pupils: Pupils are equal, round, and reactive to light.  Cardiovascular:     Rate and Rhythm: Normal rate and regular rhythm.  Pulmonary:     Effort: No respiratory distress.     Breath sounds: No wheezing.     Comments: Decreased breath sounds bilaterally.  No wheeze or crackles Abdominal:     General: Bowel sounds are normal. There is no distension.     Palpations: Abdomen is soft. There is no mass.     Tenderness: There is no abdominal tenderness. There is no guarding or rebound.  Musculoskeletal:        General: No tenderness. Normal range of motion.     Cervical back: Normal range of motion and neck supple.  Skin:    General: Skin is warm.  Neurological:     Mental Status: He is alert and oriented to person, place, and time.  Psychiatric:        Mood and Affect: Affect normal.   LABORATORY DATA:  I have reviewed the data as listed Lab Results  Component Value Date   WBC 4.2 07/07/2021   HGB 11.9 (L) 07/07/2021   HCT 36.1 (L) 07/07/2021   MCV 101.7 (H) 07/07/2021   PLT 117 (L) 07/07/2021   Recent Labs    07/08/20 0843 07/22/20 0836 08/05/20 0821 06/09/21 0830 06/25/21 1024 07/07/21 0906  NA 139 138    < > 136 135 137  K 3.6 3.6   < > 3.6  3.7 3.8  CL 108 107   < > 105 105 104  CO2 25 25   < > _0 GLUCOSE 209* 195*   < > 211* 195* 194*  BUN 9 10   < > _1 CREATININE 0.70 0.89   < > 0.89 0.84 0.90  CALCIUM 8.0* 8.1*   < > 8.0* 8.3* 8.4*  GFRNONAA >60 >60   < > >60 >60 >60  GFRAA >60 >60  --   --   --   --   PROT 6.5 6.7   < > 7.2 7.7 7.7  ALBUMIN 3.1* 3.1*   < > 2.9* 3.1* 3.0*  AST 31 29   < > 34 35 35  ALT 19 19   < > _2 ALKPHOS 102 108   < > 192* 173* 162*  BILITOT 0.7 0.8   < > 1.1 0.7 1.1   < > = values in this interval not displayed.    RADIOGRAPHIC STUDIES: I have personally reviewed the radiological images as listed and agreed with the findings in the report. No results found.   ASSESSMENT & PLAN:   Cancer of right colon ([place]) #Right-sided colon adenocarcinoma-with synchronous metastasis to liver/unresectable.   s/p Ablation- MAY 2022 MRI- Multiple lvier lesions/progression-patient underwent s/p Y 90 on 06/17; awaiting repeat MRI in late sep, 2022 [IR].  Currently on oral TAS-102 (35 mg/m2 twice daily on days 1-5 and 8-12 every 28 days].  So what I said  # proceed with cycle #3 day-1 - Zirabev. Labs today reviewed;  acceptable for treatment today; except UA- 100 protein. Proceed with  Edward Jolly the recent neutropenia]. Dose reduced to 3 pills BID- M-F  # UA protein: 100- check Urine protein ratio today; Protein Creatinine Ratio: 0.05- STABLE  # PN- G-1-2; sec to oxaliplatin- STABLE  # Mediport- functioning- STABLE.   # Shoulder pain-refill oxycodone prn [place] ]- STABLE Okay to  # DISPOSITION:   # zirabev today.  # follow up in 2 weeks-  MD; labs- cbc/cmp CEA/UA; zirabev-Dr.B -----------------------------------   #Take 3 pills twice a day-Monday through Friday; weekends off x2 weeks.    All questions were answered. The patient knows to call the clinic with any problems, questions or concerns.    Cammie Sickle, MD 07/07/2021 9:46  AM

## 2021-07-07 NOTE — Patient Instructions (Signed)
#  Take 3 pills twice a day-Monday through Friday; weekends off x2 weeks.

## 2021-07-08 LAB — CEA: CEA: 87.6 ng/mL — ABNORMAL HIGH (ref 0.0–4.7)

## 2021-07-17 ENCOUNTER — Other Ambulatory Visit: Payer: Self-pay | Admitting: *Deleted

## 2021-07-17 DIAGNOSIS — C182 Malignant neoplasm of ascending colon: Secondary | ICD-10-CM

## 2021-07-18 ENCOUNTER — Ambulatory Visit
Admission: RE | Admit: 2021-07-18 | Discharge: 2021-07-18 | Disposition: A | Discharge status: Home / Self Care | Payer: Medicare HMO | Source: Ambulatory Visit | Attending: Interventional Radiology | Admitting: Interventional Radiology

## 2021-07-18 ENCOUNTER — Other Ambulatory Visit: Payer: Self-pay

## 2021-07-18 DIAGNOSIS — C189 Malignant neoplasm of colon, unspecified: Secondary | ICD-10-CM | POA: Insufficient documentation

## 2021-07-18 DIAGNOSIS — C787 Secondary malignant neoplasm of liver and intrahepatic bile duct: Secondary | ICD-10-CM | POA: Diagnosis not present

## 2021-07-18 DIAGNOSIS — N289 Disorder of kidney and ureter, unspecified: Secondary | ICD-10-CM | POA: Diagnosis not present

## 2021-07-18 DIAGNOSIS — K769 Liver disease, unspecified: Secondary | ICD-10-CM | POA: Diagnosis not present

## 2021-07-18 IMAGING — MR MR ABDOMEN WO/W CM
18 series · 48 of 48 positions shown · IV contrast (gadavist)
Comparison: MRI abdomen dated [DATE]; CT chest abdomen and
pelvis dated [DATE]

CLINICAL DATA: MRI liver with colon cancer mets, follow-up ablation

EXAM:
MRI ABDOMEN WITHOUT AND WITH CONTRAST
TECHNIQUE: Multiplanar multisequence MR imaging of the abdomen was performed
both before and after the administration of intravenous contrast.
CONTRAST:  7.5mL GADAVIST GADOBUTROL 1 MMOL/ML IV SOLN

[Series 3: T2 · coronal · 6.0mm · 1.22mm/px · 2 of 34 slices shown (1 of 2)]
[im 1/34]
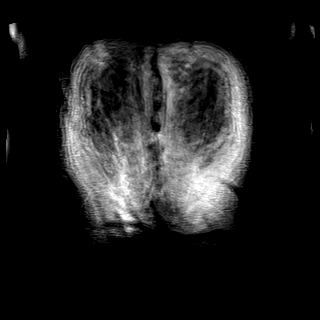
[im 34/34]
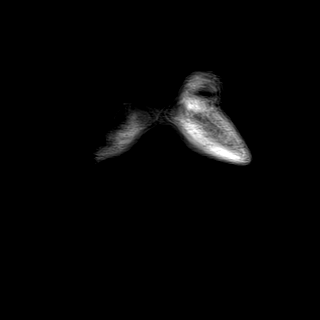

[Series 4: T2 · axial · 6.0mm · 1.25mm/px · z∈[-249,+10]mm · 2 of 37 slices shown (2 of 2)]
[im 1/37]
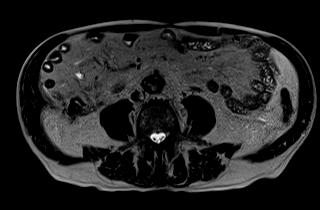
[im 37/37]
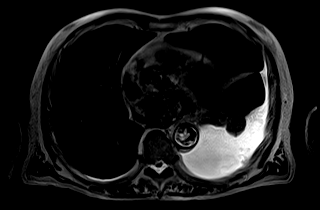

[Series 6: T2 fat-sat · axial · 6.0mm · 1.25mm/px · z∈[-312,+12]mm · 2 of 46 slices shown]
[im 1/46]
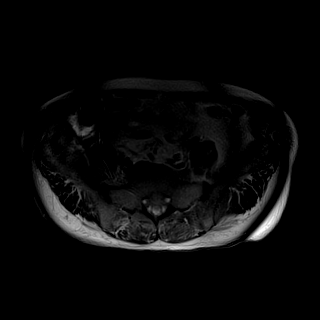
[im 46/46]
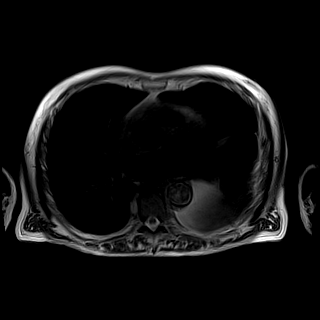

[Series 7: ax dwi_tracew · axial · 6.0mm · 1.49mm/px · z∈[-285,-4]mm · 4 of 120 slices shown]
[im 1/120]
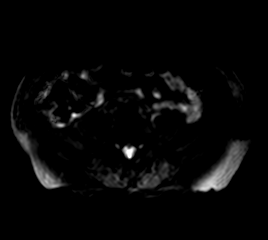
[im 40/120]
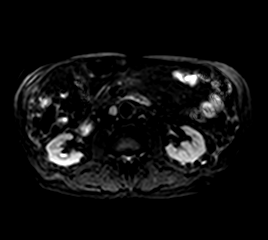
[im 80/120]
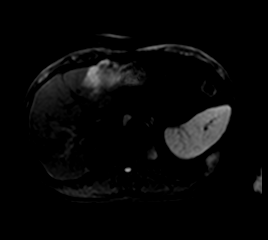
[im 120/120]
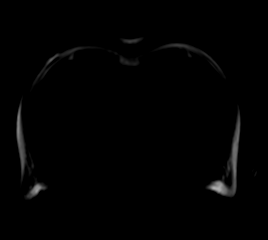

[Series 8: ax dwi_adc · axial · 6.0mm · 1.49mm/px · 1 of 40 slices shown]
[im 1/40]
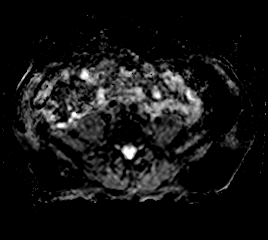

[Series 9: T1 · axial · 3.0mm · 1.19mm/px · z∈[-256,+5]mm · 3 of 88 slices shown (1 of 2)]
[im 1/88]
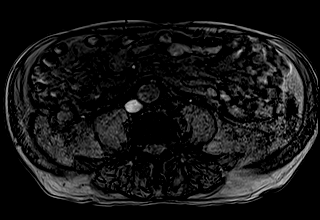
[im 44/88]
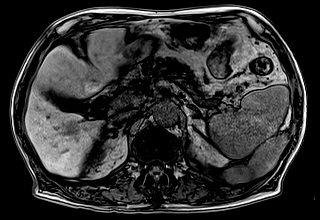
[im 88/88]
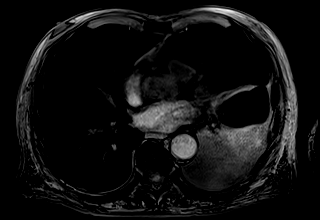

[Series 10: T1 · axial · 3.0mm · 1.19mm/px · z∈[-256,+5]mm · 3 of 88 slices shown (2 of 2)]
[im 1/88]
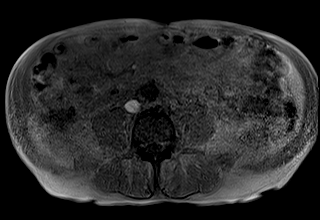
[im 44/88]
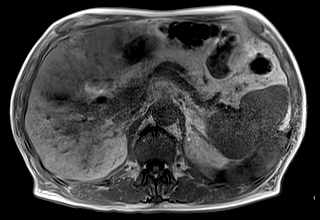
[im 88/88]
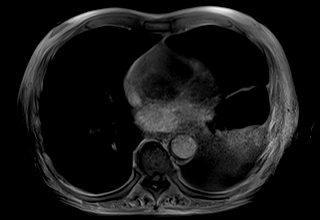

[Series 11: bSSFP · axial · 6.0mm · 0.74mm/px · 1 of 37 slices shown]
[im 1/37]
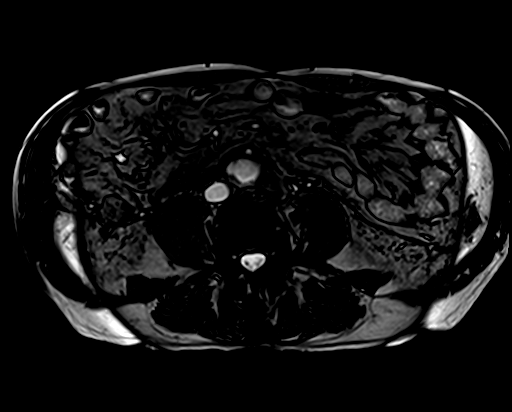

[Series 12: T1 dynamic fat-sat · axial · non-contrast · 3.0mm · 1.19mm/px · z∈[-256,+5]mm · 3 of 88 slices shown (1 of 5)]
[im 1/88]
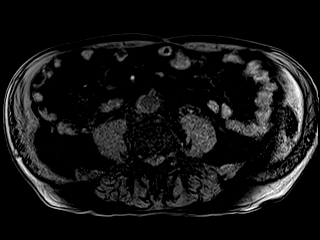
[im 44/88]
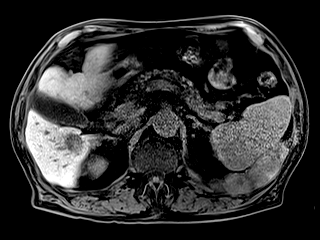
[im 88/88]
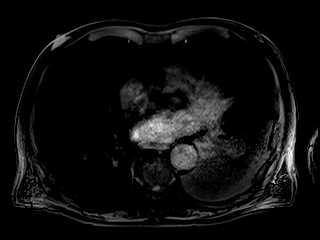

[Series 13: T1 dynamic fat-sat post-contrast · axial · 3.0mm · 1.19mm/px · z∈[-256,+5]mm · 3 of 88 slices shown (1 of 4)]
[im 1/88]
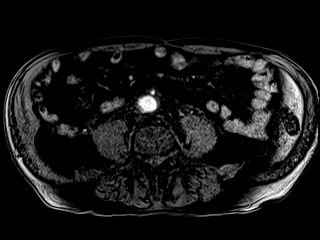
[im 44/88]
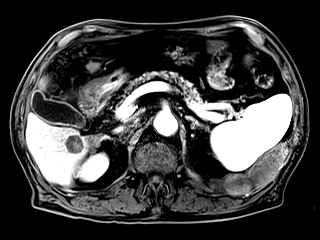
[im 88/88]
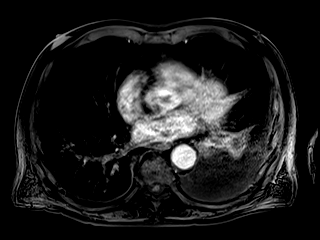

[Series 14: T1 dynamic fat-sat · axial · 3.0mm · 1.19mm/px · z∈[-256,+5]mm · 3 of 88 slices shown (2 of 5)]
[im 1/88]
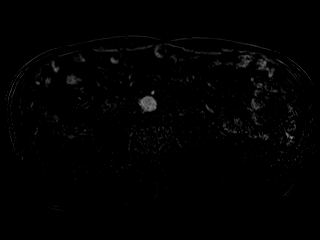
[im 44/88]
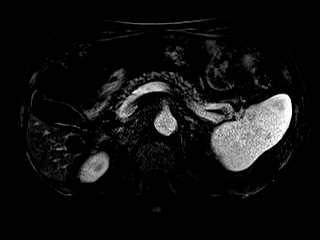
[im 88/88]
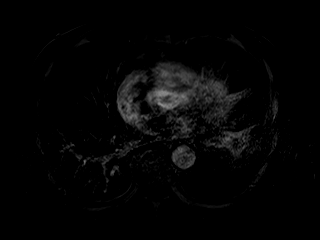

[Series 15: T1 dynamic fat-sat post-contrast · axial · 3.0mm · 1.19mm/px · z∈[-256,+5]mm · 3 of 88 slices shown (2 of 4)]
[im 1/88]
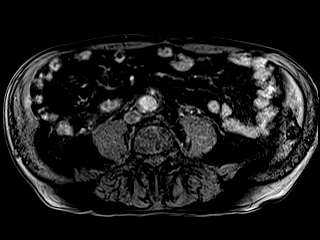
[im 44/88]
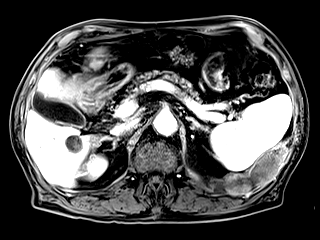
[im 88/88]
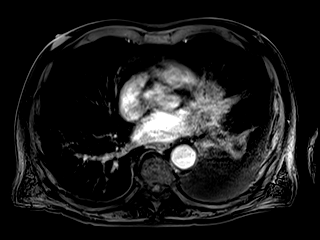

[Series 16: T1 dynamic fat-sat · axial · 3.0mm · 1.19mm/px · z∈[-256,+5]mm · 3 of 88 slices shown (3 of 5)]
[im 1/88]
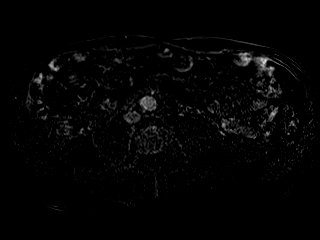
[im 44/88]
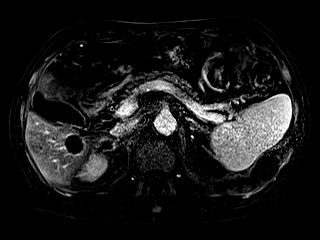
[im 88/88]
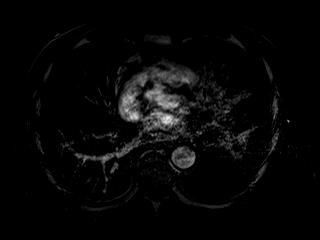

[Series 17: T1 dynamic fat-sat post-contrast · axial · 3.0mm · 1.19mm/px · z∈[-256,+5]mm · 3 of 88 slices shown (3 of 4)]
[im 1/88]
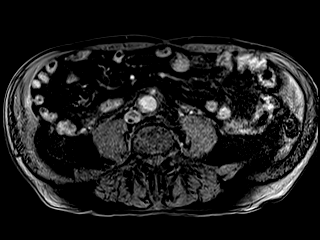
[im 44/88]
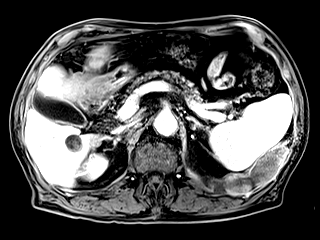
[im 88/88]
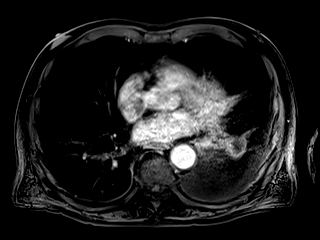

[Series 18: T1 dynamic fat-sat · axial · 3.0mm · 1.19mm/px · z∈[-256,+5]mm · 3 of 88 slices shown (4 of 5)]
[im 1/88]
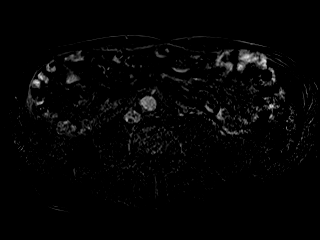
[im 44/88]
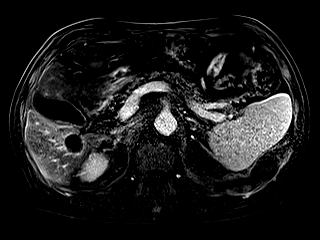
[im 88/88]
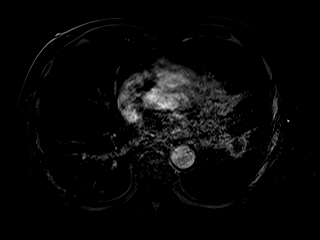

[Series 19: T1 dynamic post-contrast · coronal · 3.0mm · 1.31mm/px · 3 of 72 slices shown]
[im 1/72]
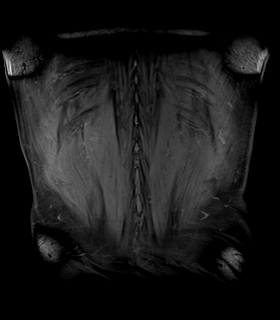
[im 36/72]
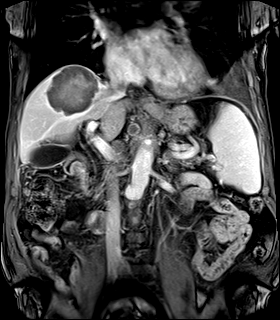
[im 72/72]
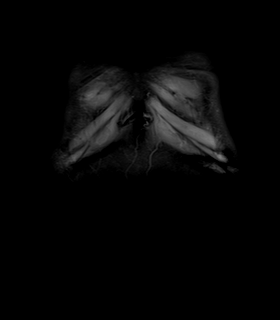

[Series 20: T1 dynamic fat-sat post-contrast · axial · 3.0mm · 1.19mm/px · z∈[-256,+5]mm · 3 of 88 slices shown (4 of 4)]
[im 1/88]
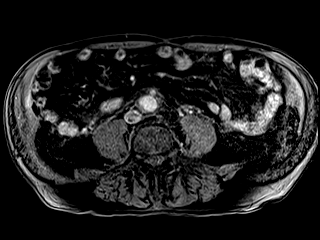
[im 44/88]
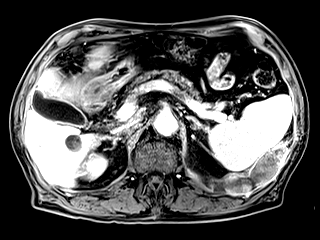
[im 88/88]
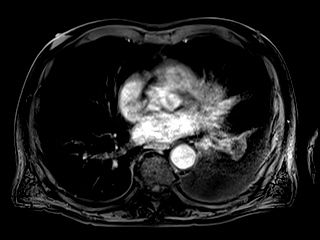

[Series 21: T1 dynamic fat-sat · axial · 3.0mm · 1.19mm/px · z∈[-256,+5]mm · 3 of 88 slices shown (5 of 5)]
[im 1/88]
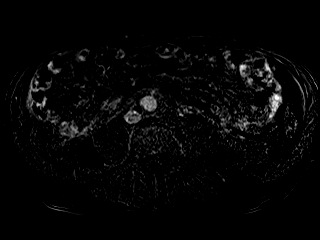
[im 44/88]
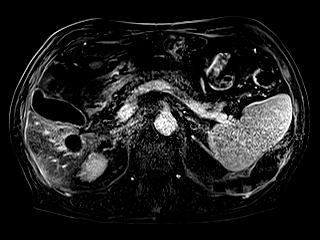
[im 88/88]
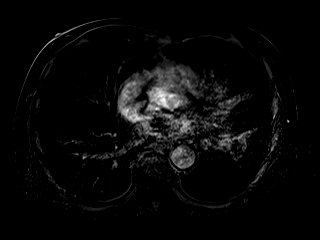

[48 of 48 positions shown; findings below may reference images not displayed]

FINDINGS: Lower chest: New moderate left pleural effusion with multiple new
pleural nodules. Reference nodule of the left pleura measuring 5.6 x
2.5 cm on series 4, image 20.

Hepatobiliary: Lesion at the liver dome measuring approximately
x 7.0 cm on series 20, image 20, previously measured 7.0 x 6.8 cm,
interval resolution of heterogeneous peripheral contrast
enhancement, internal T1 hyperintensity which likely represents
blood products. Ablation site of the anterior left lobe of the liver
in segment II/III demonstrates no residual contrast enhancement,
series 20 image 35. Lesion of hepatic segment VI on series 20, image
46 is decreased in size compared to prior and demonstrates no
residual contrast enhancement, measures 2.3 x 1.9 cm, previously
x 2.2 cm. Lesion in segment VI on series 20, image 54 is decreased
in size and demonstrates no residual contrast enhancement, measures
1.2 cm, previously 1.9 x 1.6 cm. Image normal gallbladder. Unchanged
dilation of the common bile duct measuring up to 9 mm.

Pancreas: No mass, inflammatory changes, or other parenchymal
abnormality identified.

Spleen:  Within normal limits in size and appearance.

Adrenals/Urinary Tract: Heterogeneous solid lesion of the upper pole
of the right kidney measuring 2.8 x 1.7 cm on series 20, image 55,
previously measured 1.7 x 1.4 cm. No evidence of hydronephrosis.

Stomach/Bowel: Visualized portions demonstrate no evidence of
obstruction or wall thickening.Peritoneal implant of the right lower
quadrant measuring 3.2 x 2.4 cm seen just inferior to area of right
hemicolectomy.

Vascular/Lymphatic: New enlarged periportal and retroperitoneal
lymph nodes. Reference periportal lymph node measuring 1.5 cm in
short axis on series 15, image 41, previously measured 5 mm. New
enlarged pericaval lymph node measuring 1.8 cm in short axis on
series 15, image 49.

Other: Soft tissue nodule of the right lower quadrant measuring
x 2.4 cm seen adjacent to several small bowel loops.

Musculoskeletal: No suspicious bone lesions identified.
IMPRESSION: Treated lesions of the hepatic dome and segment VI demonstrate no
residual contrast enhancement.

New moderate left pleural effusion with multiple new large pleural
nodules, concerning for pleural metastatic disease.

New enlarged periportal and retroperitoneal lymph nodes, findings
are concerning for progressive metastatic disease.

Increased size of heterogeneous solid lesion of the upper pole of
the right kidney, concerning for additional site of metastatic
disease.

Peritoneal implant of the right lower quadrant seen just inferior to
area of right hemicolectomy is increased in size when compared with
prior CT. Comparison with prior MRI is limited due to differences in
scan field-of-view, but possibly slightly increased in size.

## 2021-07-18 MED ORDER — GADOBUTROL 1 MMOL/ML IV SOLN
7.5000 mL | Freq: Once | INTRAVENOUS | Status: AC | PRN
  Administered 2021-07-18: 7.5 mL via INTRAVENOUS

## 2021-07-21 ENCOUNTER — Inpatient Hospital Stay: Payer: Medicare HMO

## 2021-07-21 ENCOUNTER — Inpatient Hospital Stay: Payer: Medicare HMO | Attending: Hospice and Palliative Medicine

## 2021-07-21 ENCOUNTER — Encounter: Payer: Self-pay | Admitting: Internal Medicine

## 2021-07-21 ENCOUNTER — Inpatient Hospital Stay: Payer: Medicare HMO | Admitting: Internal Medicine

## 2021-07-21 DIAGNOSIS — C182 Malignant neoplasm of ascending colon: Secondary | ICD-10-CM | POA: Diagnosis not present

## 2021-07-21 DIAGNOSIS — Z7189 Other specified counseling: Secondary | ICD-10-CM

## 2021-07-21 DIAGNOSIS — F17210 Nicotine dependence, cigarettes, uncomplicated: Secondary | ICD-10-CM | POA: Insufficient documentation

## 2021-07-21 DIAGNOSIS — Z5112 Encounter for antineoplastic immunotherapy: Secondary | ICD-10-CM | POA: Insufficient documentation

## 2021-07-21 DIAGNOSIS — C787 Secondary malignant neoplasm of liver and intrahepatic bile duct: Secondary | ICD-10-CM | POA: Diagnosis not present

## 2021-07-21 LAB — CBC WITH DIFFERENTIAL/PLATELET
Abs Immature Granulocytes: 0.01 10*3/uL (ref 0.00–0.07)
Basophils Absolute: 0 10*3/uL (ref 0.0–0.1)
Basophils Relative: 1 %
Eosinophils Absolute: 0.1 10*3/uL (ref 0.0–0.5)
Eosinophils Relative: 6 %
HCT: 32 % — ABNORMAL LOW (ref 39.0–52.0)
Hemoglobin: 10.8 g/dL — ABNORMAL LOW (ref 13.0–17.0)
Immature Granulocytes: 1 %
Lymphocytes Relative: 27 %
Lymphs Abs: 0.5 10*3/uL — ABNORMAL LOW (ref 0.7–4.0)
MCH: 34.2 pg — ABNORMAL HIGH (ref 26.0–34.0)
MCHC: 33.8 g/dL (ref 30.0–36.0)
MCV: 101.3 fL — ABNORMAL HIGH (ref 80.0–100.0)
Monocytes Absolute: 0.1 10*3/uL (ref 0.1–1.0)
Monocytes Relative: 4 %
Neutro Abs: 1.2 10*3/uL — ABNORMAL LOW (ref 1.7–7.7)
Neutrophils Relative %: 61 %
Platelets: 73 10*3/uL — ABNORMAL LOW (ref 150–400)
RBC: 3.16 MIL/uL — ABNORMAL LOW (ref 4.22–5.81)
RDW: 15.3 % (ref 11.5–15.5)
Smear Review: NORMAL
WBC: 1.9 10*3/uL — ABNORMAL LOW (ref 4.0–10.5)
nRBC: 0 % (ref 0.0–0.2)

## 2021-07-21 LAB — URINALYSIS, COMPLETE (UACMP) WITH MICROSCOPIC
Bacteria, UA: NONE SEEN
Bilirubin Urine: NEGATIVE
Glucose, UA: NEGATIVE mg/dL
Ketones, ur: NEGATIVE mg/dL
Leukocytes,Ua: NEGATIVE
Nitrite: NEGATIVE
Protein, ur: 30 mg/dL — AB
Specific Gravity, Urine: 1.026 (ref 1.005–1.030)
pH: 5 (ref 5.0–8.0)

## 2021-07-21 LAB — COMPREHENSIVE METABOLIC PANEL
ALT: 18 U/L (ref 0–44)
AST: 37 U/L (ref 15–41)
Albumin: 2.8 g/dL — ABNORMAL LOW (ref 3.5–5.0)
Alkaline Phosphatase: 147 U/L — ABNORMAL HIGH (ref 38–126)
Anion gap: 4 — ABNORMAL LOW (ref 5–15)
BUN: 14 mg/dL (ref 8–23)
CO2: 25 mmol/L (ref 22–32)
Calcium: 8.1 mg/dL — ABNORMAL LOW (ref 8.9–10.3)
Chloride: 109 mmol/L (ref 98–111)
Creatinine, Ser: 0.73 mg/dL (ref 0.61–1.24)
GFR, Estimated: >60 mL/min (ref >60)
Glucose, Bld: 197 mg/dL — ABNORMAL HIGH (ref 70–99)
Potassium: 3.7 mmol/L (ref 3.5–5.1)
Sodium: 138 mmol/L (ref 135–145)
Total Bilirubin: 1.1 mg/dL (ref 0.3–1.2)
Total Protein: 7.3 g/dL (ref 6.5–8.1)

## 2021-07-21 MED ORDER — SODIUM CHLORIDE 0.9% FLUSH
10.0000 mL | Freq: Once | INTRAVENOUS | Status: AC
  Administered 2021-07-21: 10 mL via INTRAVENOUS
  Filled 2021-07-21: qty 10

## 2021-07-21 MED ORDER — HEPARIN SOD (PORK) LOCK FLUSH 100 UNIT/ML IV SOLN
500.0000 [IU] | Freq: Once | INTRAVENOUS | Status: AC | PRN
  Administered 2021-07-21: 500 [IU]
  Filled 2021-07-21: qty 5

## 2021-07-21 MED ORDER — SODIUM CHLORIDE 0.9 % IV SOLN
Freq: Once | INTRAVENOUS | Status: AC
  Filled 2021-07-21: qty 250

## 2021-07-21 MED ORDER — SODIUM CHLORIDE 0.9 % IV SOLN
5.0000 mg/kg | Freq: Once | INTRAVENOUS | Status: AC
  Administered 2021-07-21: 400 mg via INTRAVENOUS
  Filled 2021-07-21: qty 16

## 2021-07-21 NOTE — Patient Instructions (Signed)
[facility]  Discharge Instructions: Thank you for choosing [facility] to provide your oncology and hematology care.  If you have a lab appointment with the [facility], please go directly to the [facility] and check in at the registration area.  Wear comfortable clothing and clothing appropriate for easy access to any Portacath or PICC line.   We strive to give you quality time with your provider. You may need to reschedule your appointment if you arrive late (15 or more minutes).  Arriving late affects you and other patients whose appointments are after yours.  Also, if you miss three or more appointments without notifying the office, you may be dismissed from the clinic at the provider's discretion.      For prescription refill requests, have your pharmacy contact our office and allow 72 hours for refills to be completed.    Today you received the following chemotherapy and/or immunotherapy agents: Oscar [name]      To help prevent nausea and vomiting after your treatment, we encourage you to take your nausea medication as directed.  BELOW ARE SYMPTOMS THAT SHOULD BE REPORTED IMMEDIATELY: *FEVER GREATER THAN 100.4 F (38 C) OR HIGHER *CHILLS OR SWEATING *NAUSEA AND VOMITING THAT IS NOT CONTROLLED WITH YOUR NAUSEA MEDICATION *UNUSUAL SHORTNESS OF BREATH *UNUSUAL BRUISING OR BLEEDING *URINARY PROBLEMS (pain or burning when urinating, or frequent urination) *BOWEL PROBLEMS (unusual diarrhea, constipation, pain near the anus) TENDERNESS IN MOUTH AND THROAT WITH OR WITHOUT PRESENCE OF ULCERS (sore throat, sores in mouth, or a toothache) UNUSUAL RASH, SWELLING OR PAIN  UNUSUAL VAGINAL DISCHARGE OR ITCHING   Items with * indicate a potential emergency and should be followed up as soon as possible or go to the Emergency Department if any problems should occur.  Please show the CHEMOTHERAPY ALERT CARD or IMMUNOTHERAPY ALERT CARD at check-in to  the Emergency Department and triage nurse.  Should you have questions after your visit or need to cancel or reschedule your appointment, please contact [facility]  [phone] and follow the prompts.  Office hours are 8:00 a.m. to 4:30 p.m. Monday - Friday. Please note that voicemails left after 4:00 p.m. may not be returned until the following business day.  We are closed weekends and major holidays. You have access to a nurse at all times for urgent questions. Please call the main number to the clinic [phone] and follow the prompts.  For any non-urgent questions, you may also contact your provider using MyChart. We now offer e-Visits for anyone 48 and older to request care online for non-urgent symptoms. For details visit [email or web address].   Also download the MyChart app! Go to the app store, search "MyChart", open the app, select [facility], and log in with your MyChart username and password.  Due to Covid, a mask is required upon entering the hospital/clinic. If you do not have a mask, one will be given to you upon arrival. For doctor visits, patients may have 1 support person aged 28 or older with them. For treatment visits, patients cannot have anyone with them due to current Covid guidelines and our immunocompromised population. Bevacizumab injection What is this medication? BEVACIZUMAB (be va SIZ yoo mab) is a monoclonal antibody. It is used to treat many types of cancer. This medicine may be used for other purposes; ask your health care provider or pharmacist if you have questions. COMMON BRAND NAME(S): Avastin, MVASI, Zirabev What should I tell my care team  before I take this medication? They need to know if you have any of these conditions: diabetes heart disease high blood pressure history of coughing up blood prior anthracycline chemotherapy (e.g., doxorubicin, daunorubicin, epirubicin) recent or ongoing radiation therapy recent or  planning to have surgery stroke an unusual or allergic reaction to bevacizumab, hamster proteins, mouse proteins, other medicines, foods, dyes, or preservatives pregnant or trying to get pregnant breast-feeding How should I use this medication? This medicine is for infusion into a vein. It is given by a health care professional in a hospital or clinic setting. Talk to your pediatrician regarding the use of this medicine in children. Special care may be needed. Overdosage: If you think you have taken too much of this medicine contact a poison control center or emergency room at once. NOTE: This medicine is only for you. Do not share this medicine with others. What if I miss a dose? It is important not to miss your dose. Call your doctor or health care professional if you are unable to keep an appointment. What may interact with this medication? Interactions are not expected. This list may not describe all possible interactions. Give your health care provider a list of all the medicines, herbs, non-prescription drugs, or dietary supplements you use. Also tell them if you smoke, drink alcohol, or use illegal drugs. Some items may interact with your medicine. What should I watch for while using this medication? Your condition will be monitored carefully while you are receiving this medicine. You will need important blood work and urine testing done while you are taking this medicine. This medicine may increase your risk to bruise or bleed. Call your doctor or health care professional if you notice any unusual bleeding. Before having surgery, talk to your health care provider to make sure it is ok. This drug can increase the risk of poor healing of your surgical site or wound. You will need to stop this drug for 28 days before surgery. After surgery, wait at least 28 days before restarting this drug. Make sure the surgical site or wound is healed enough before restarting this drug. Talk to your health  care provider if questions. Do not become pregnant while taking this medicine or for 6 months after stopping it. Women should inform their doctor if they wish to become pregnant or think they might be pregnant. There is a potential for serious side effects to an unborn child. Talk to your health care professional or pharmacist for more information. Do not breast-feed an infant while taking this medicine and for 6 months after the last dose. This medicine has caused ovarian failure in some women. This medicine may interfere with the ability to have a child. You should talk to your doctor or health care professional if you are concerned about your fertility. What side effects may I notice from receiving this medication? Side effects that you should report to your doctor or health care professional as soon as possible: allergic reactions like skin rash, itching or hives, swelling of the face, lips, or tongue chest pain or chest tightness chills coughing up blood high fever seizures severe constipation signs and symptoms of bleeding such as bloody or black, tarry stools; red or dark-brown urine; spitting up blood or brown material that looks like coffee grounds; red spots on the skin; unusual bruising or bleeding from the eye, gums, or nose signs and symptoms of a blood clot such as breathing problems; chest pain; severe, sudden headache; pain, swelling, warmth in  the leg signs and symptoms of a stroke like changes in vision; confusion; trouble speaking or understanding; severe headaches; sudden numbness or weakness of the face, arm or leg; trouble walking; dizziness; loss of balance or coordination stomach pain sweating swelling of legs or ankles vomiting weight gain Side effects that usually do not require medical attention (report to your doctor or health care professional if they continue or are bothersome): back pain changes in taste decreased appetite dry skin nausea tiredness This list  may not describe all possible side effects. Call your doctor for medical advice about side effects. You may report side effects to FDA at 1-800-FDA-1088. Where should I keep my medication? This drug is given in a hospital or clinic and will not be stored at home. NOTE: This sheet is a summary. It may not cover all possible information. If you have questions about this medicine, talk to your doctor, pharmacist, or health care provider.  2022 Elsevier/Gold Standard (2019-08-02 10:50:46)

## 2021-07-21 NOTE — Assessment & Plan Note (Signed)
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   s/p Ablation- MAY 2022 MRI- Multiple lvier lesions/progression-patient underwent s/p Y 90 on 06/17; awaiting repeat MRI in late sep, 2022 [IR].  Currently on oral TAS-102 (*35 mg/m2 twice daily on days 1-5 and 8-12 every 28 days].    # proceed with cycle #3 day-15 - Zirabev. Labs today reviewed;  acceptable for treatment today; except UA- 100 protein. S/p L[place]f [ WBC- 1.9; platelets- 73; Hb 10.8]. continue with current dose reduced to 4 [14x4=60] pills BID- M-F  # UA protein: 100- check Urine protein ratio today; Protein Creatinine Ratio: 0.05-STABLE.  # Hypoalbuminemia/malnutrition: recommend Ensure/proetin intake. :    # PN- G-1-2; sec to oxaliplatin- STABLE>  # Mediport- functioning- STABLE.   # Shoulder pain-refill oxycodone prn Silver.[place] ]- STABLE.   # DISPOSITION:  # zirabev today.  # follow up in 2 weeks-  MD; labs- cbc/cmp CEA/UA; zirabev-Dr.B

## 2021-07-21 NOTE — Progress Notes (Signed)
[facility] NOTE  Patient Care Team: Sofie Hartigan, MD as PCP - General (Family Medicine) Cammie Sickle, MD as Consulting Physician (Hematology and Oncology)  CHIEF COMPLAINTS/PURPOSE OF CONSULTATION: Colon cancer  #  Oncology History Overview Note  # MAY 2020- 3. 03/09/19 Liver biopsy. Microscopic examination shows malignant cells with glandular architecture consistent with adenocarcinoma. The malignant cells are positive for CK20 and CDX-2. These findings support the clinical impression of metastatic colon adenocarcinoma. 4. 03/10/19 R hemicolectomy. Tumor site cecum. Adenocarcinoma. Mucinous features present. G2. No tumor deposits. Invades visceral peritoneum. No tumor perforation. LVI present. PNI not identified. All margins uninvolved. 1/12 LNs. PT4apN1. Periappendiceal inflammation c/w resolving abscess. Microsatellite stable (MSS). [Dr.Mettu; [facility]]  # SEP 4th 2020 [compared to May 2020]  Interval increase in size of the metastases to the hepatic dome, The metastasis to the left hepatic lobe is unchanged; 2.  New subcentimeter hypoattenuating lesion in the inferior right hepatic lobe, incompletely characterized on CT. 3.  Postsurgical changes following right hemicolectomy.  # OCT 2020- FOLFOX +avastin; CT dec 22nd 2020- [compared to [facility] sep 9th 2020]-Liver- slight progression versus stable disease; CT scan SEP 4th 2021- Progressive disease-left lower lobe lung nodule 8 mm [previously 4 mm]; increase in size of the hepatic metastases by few millimeters.;  Soft tissue nodule adjacent to anastomotic site again increased by few millimeters.STOP FOLFOX; cont avastin  #  SEP 20th, 2021- FOLFIRI+ AVASTIN; HOLD FEB 2022- sec to poor tolerance [peptic ulcer]; 3/30-liver ablation-   [bland embolization of segment 2/3 liver lesion on 12/19/2020;  microwave ablation of both liver lesions.  # July 25th, 2022-   # FEB /16-EGD [Dr.Anna-duodenal ulcer-? meloxicam]  #  JAN 2022- COVID [s/p Mab infusion; pills; skin rash resolved.]  # NGS/F-ONE-MUTATED K-RAS [G]  # PALLIATIVE CARE EVALUATION: 09/20/2019-[name]  # PAIN MANAGEMENT: NA   DIAGNOSIS: COLON CANCER  STAGE:  IV     ;  GOALS:Palliative  CURRENT/MOST RECENT THERAPY : FOLFIRI+ avastin [C]    Cancer of right colon ([place])  07/05/2019 Initial Diagnosis   Cancer of right colon ([place])   07/24/2019 - 06/25/2020 Chemotherapy   The patient had dexamethasone (DECADRON) 4 MG tablet, 8 mg, Oral, Daily, 1 of 1 cycle, Start date: --, End date: -- palonosetron (ALOXI) injection 0.25 mg, 0.25 mg, Intravenous,  Once, 23 of 25 cycles Administration: 0.25 mg (07/24/2019), 0.25 mg (08/07/2019), 0.25 mg (08/21/2019), 0.25 mg (09/04/2019), 0.25 mg (09/20/2019), 0.25 mg (10/04/2019), 0.25 mg (10/16/2019), 0.25 mg (10/30/2019), 0.25 mg (11/22/2019), 0.25 mg (12/06/2019), 0.25 mg (12/20/2019), 0.25 mg (01/03/2020), 0.25 mg (01/29/2020), 0.25 mg (02/12/2020), 0.25 mg (02/26/2020), 0.25 mg (03/11/2020), 0.25 mg (03/25/2020), 0.25 mg (04/08/2020), 0.25 mg (04/24/2020), 0.25 mg (05/08/2020), 0.25 mg (05/22/2020), 0.25 mg (06/10/2020), 0.25 mg (06/25/2020) leucovorin 800 mg in dextrose 5 % 250 mL infusion, 844 mg, Intravenous,  Once, 23 of 25 cycles Administration: 800 mg (07/24/2019), 800 mg (08/07/2019), 800 mg (08/21/2019), 800 mg (09/04/2019), 800 mg (09/20/2019), 800 mg (10/04/2019), 800 mg (10/16/2019), 800 mg (10/30/2019), 800 mg (11/22/2019), 800 mg (12/06/2019), 800 mg (12/20/2019), 800 mg (01/03/2020), 800 mg (01/29/2020), 800 mg (02/12/2020), 800 mg (02/26/2020), 800 mg (03/11/2020), 800 mg (03/25/2020), 800 mg (04/08/2020), 800 mg (04/24/2020), 800 mg (05/08/2020), 800 mg (05/22/2020), 800 mg (06/10/2020), 800 mg (06/25/2020) oxaliplatin (ELOXATIN) 180 mg in dextrose 5 % 500 mL chemo infusion, 85 mg/m2 = 180 mg, Intravenous,  Once, 23 of 25 cycles Dose modification: 178 mg (original dose 85 mg/m2, Cycle 17, Reason:  Other (see comments), Comment: insurance adjusted dose  ) Administration: 180 mg (07/24/2019), 180 mg (08/07/2019), 180 mg (08/21/2019), 180 mg (09/04/2019), 180 mg (09/20/2019), 180 mg (10/04/2019), 180 mg (10/16/2019), 180 mg (10/30/2019), 180 mg (11/22/2019), 180 mg (12/06/2019), 180 mg (12/20/2019), 180 mg (01/03/2020), 180 mg (01/29/2020), 180 mg (02/12/2020), 180 mg (02/26/2020), 180 mg (03/11/2020), 180 mg (04/08/2020), 180 mg (04/24/2020), 180 mg (05/08/2020), 180 mg (05/22/2020), 180 mg (06/10/2020), 180 mg (06/25/2020) fluorouracil (ADRUCIL) 5,000 mg in sodium chloride 0.9 % 150 mL chemo infusion, 5,050 mg, Intravenous, 1 Day/Dose, 23 of 25 cycles Administration: 5,000 mg (07/24/2019), 5,000 mg (08/07/2019), 5,000 mg (08/21/2019), 5,050 mg (09/04/2019), 5,000 mg (10/04/2019), 5,000 mg (10/16/2019), 5,000 mg (11/22/2019), 5,000 mg (12/06/2019), 5,000 mg (12/20/2019), 5,000 mg (01/03/2020), 5,000 mg (01/29/2020), 5,000 mg (02/12/2020), 5,000 mg (02/26/2020), 5,000 mg (03/11/2020), 5,000 mg (03/25/2020), 5,000 mg (04/08/2020), 5,000 mg (04/24/2020), 5,000 mg (05/08/2020), 5,000 mg (05/22/2020), 5,000 mg (06/10/2020), 5,000 mg (06/25/2020) bevacizumab-bvzr (ZIRABEV) 400 mg in sodium chloride 0.9 % 100 mL chemo infusion, 5 mg/kg = 400 mg, Intravenous,  Once, 23 of 25 cycles Administration: 400 mg (08/07/2019), 400 mg (08/21/2019), 400 mg (09/04/2019), 400 mg (09/20/2019), 400 mg (10/04/2019), 400 mg (10/16/2019), 400 mg (10/30/2019), 400 mg (11/22/2019), 400 mg (12/06/2019), 400 mg (12/20/2019), 400 mg (01/03/2020), 400 mg (01/29/2020), 400 mg (02/12/2020), 400 mg (02/26/2020), 400 mg (03/11/2020), 400 mg (03/25/2020), 400 mg (04/08/2020), 400 mg (04/24/2020), 400 mg (05/08/2020), 400 mg (05/22/2020), 400 mg (06/10/2020), 400 mg (06/25/2020)   for chemotherapy treatment.     07/08/2020 -  Chemotherapy   Patient is on Treatment Plan : COLORECTAL FOLFIRI / BEVACIZUMAB Q14D     11/05/2020 Cancer Staging   Staging form: Colon and Rectum, AJCC 8th Edition - Clinical: Stage IVC (pM1c) - Signed by Cammie Sickle, MD on  11/05/2020     HISTORY OF PRESENTING ILLNESS: Alone.  Ambulating independently. Oscar [name] 80 y.o.  male with metastatic colon cancer to the liver- -currently on bevacizumab plus Lonsurf is here for follow-up.  Patient currently on Lonsurf-currently on cycle #2; currently on 2 weeks break.  No fever no chills no diarrhea.  No nausea no vomiting no fever no chills.  No sores in the mouth.    Review of Systems  Constitutional:  Positive for malaise/fatigue. Negative for chills, diaphoresis and fever.  HENT:  Negative for nosebleeds and sore throat.   Eyes:  Negative for double vision.  Respiratory:  Negative for cough, hemoptysis, sputum production, shortness of breath and wheezing.   Cardiovascular:  Negative for chest pain, palpitations, orthopnea and leg swelling.  Gastrointestinal:  Negative for blood in stool, constipation, heartburn, melena, nausea and vomiting.  Genitourinary:  Negative for dysuria, frequency and urgency.  Musculoskeletal:  Positive for back pain and joint pain.  Skin: Negative.  Negative for itching and rash.  Neurological:  Positive for tingling. Negative for focal weakness, weakness and headaches.  Endo/Heme/Allergies:  Does not bruise/bleed easily.  Psychiatric/Behavioral:  Negative for depression. The patient is not nervous/anxious and does not have insomnia.     MEDICAL HISTORY:  Past Medical History:  Diagnosis Date  . Arthritis   . Cancer ([place])    colon cancer 02/2019 per pt   . Family history of adverse reaction to anesthesia    cousin took all night to wake up from anesthesia  . H/O colon cancer, stage IV   . Hyperlipemia   . Neuromuscular disorder ([place])   . Pre-diabetes     SURGICAL HISTORY:  Past Surgical History:  Procedure Laterality Date  . COLON SURGERY    . ESOPHAGOGASTRODUODENOSCOPY (EGD) WITH PROPOFOL N/A 12/26/2020   Procedure: ESOPHAGOGASTRODUODENOSCOPY (EGD) WITH PROPOFOL;  Surgeon: [name], MD;  Location[facility] ENDOSCOPY;   Service: Gastroenterology;  Laterality: N/A;  . IR ANGIOGRAM SELECTIVE EACH ADDITIONAL VESSEL  12/19/2020  . IR ANGIOGRAM SELECTIVE EACH ADDITIONAL VESSEL  03/24/2021  . IR ANGIOGRAM SELECTIVE EACH ADDITIONAL VESSEL  03/24/2021  . IR ANGIOGRAM SELECTIVE EACH ADDITIONAL VESSEL  04/04/2021  . IR ANGIOGRAM SELECTIVE EACH ADDITIONAL VESSEL  04/04/2021  . IR ANGIOGRAM SELECTIVE EACH ADDITIONAL VESSEL  04/04/2021  . IR ANGIOGRAM VISCERAL SELECTIVE  12/19/2020  . IR ANGIOGRAM VISCERAL SELECTIVE  03/24/2021  . IR ANGIOGRAM VISCERAL SELECTIVE  04/04/2021  . IR ANGIOGRAM VISCERAL SELECTIVE  04/04/2021  . IR EMBO ARTERIAL NOT HEMORR HEMANG INC GUIDE ROADMAPPING  03/24/2021  . IR EMBO TUMOR ORGAN ISCHEMIA INFARCT INC GUIDE ROADMAPPING  12/19/2020  . IR EMBO TUMOR ORGAN ISCHEMIA INFARCT INC GUIDE ROADMAPPING  04/04/2021  . IR IMAGING GUIDED PORT INSERTION  07/20/2019  . IR RADIOLOGIST EVAL & MGMT  12/05/2020  . IR RADIOLOGIST EVAL & MGMT  02/11/2021  . IR RADIOLOGIST EVAL & MGMT  03/04/2021  . IR RADIOLOGIST EVAL & MGMT  04/29/2021  . IR US GUIDE VASC ACCESS RIGHT  12/19/2020  . IR US GUIDE VASC ACCESS RIGHT  03/24/2021  . IR US GUIDE VASC ACCESS RIGHT  04/04/2021  . JOINT REPLACEMENT Left 2010   Knee  . RADIOLOGY WITH ANESTHESIA N/A 01/15/2021   Procedure: CT WITH ANESTHESIA MICROWAVE ABLATION OF LIVER;  Surgeon: Criselda Peaches, MD;  Location: [facility] ORS;  Service: Anesthesiology;  Laterality: N/A;    SOCIAL HISTORY: Social History   Socioeconomic History  . Marital status: Married    Spouse name: Not on file  . Number of children: Not on file  . Years of education: Not on file  . Highest education level: Not on file  Occupational History  . Not on file  Tobacco Use  . Smoking status: Every Day    Packs/day: 0.25    Years: 15.00    Pack years: 3.75    Types: Cigarettes  . Smokeless tobacco: Never  . Tobacco comments:    1 to 2 cigarettes a day occasionally  Vaping Use  . Vaping Use: Never used  Substance and  Sexual Activity  . Alcohol use: Yes    Comment: rarely   . Drug use: No  . Sexual activity: Not on file  Other Topics Concern  . Not on file  Social History Narrative   Recruitment consultant retd; lives in [place]; smoking 3cig/day; [3/4 ppd x started at 9 years]; no alcohol. Son & daughter; wife dementia [waiting for placement].    Social Determinants of Health   Financial Resource Strain: Not on file  Food Insecurity: Not on file  Transportation Needs: Not on file  Physical Activity: Not on file  Stress: Not on file  Social Connections: Not on file  Intimate Partner Violence: Not on file    FAMILY HISTORY: Family History  Problem Relation Age of Onset  . Peptic Ulcer Disease Father     ALLERGIES:  is allergic to ace inhibitors.  MEDICATIONS:  Current Outpatient Medications  Medication Sig Dispense Refill  . amLODipine (NORVASC) 5 MG tablet Take 5 mg by mouth daily.    Marland Kitchen oxyCODONE (OXY IR/ROXICODONE) 5 MG immediate release tablet Take 1 tablet (5 mg total) by mouth  every 6 (six) hours as needed for severe pain. 60 tablet 0  . tamsulosin (FLOMAX) 0.4 MG CAPS capsule Take 1 capsule (0.4 mg total) by mouth daily. 30 capsule 11  . ondansetron (ZOFRAN) 4 MG tablet Take 1 tablet (4 mg total) by mouth every 8 (eight) hours as needed for nausea or vomiting. (Patient not taking: No sig reported) 20 tablet 0  . ondansetron (ZOFRAN) 8 MG tablet Take 1 tablet (8 mg total) by mouth every 8 (eight) hours as needed for nausea or vomiting. (Patient not taking: No sig reported) 20 tablet 0  . pravastatin (PRAVACHOL) 40 MG tablet Take 40 mg by mouth daily.  (Patient not taking: No sig reported)    . prochlorperazine (COMPAZINE) 10 MG tablet Take 1 tablet (10 mg total) by mouth every 6 (six) hours as needed for nausea or vomiting. (Patient not taking: No sig reported) 60 tablet 0  . sucralfate (CARAFATE) 1 g tablet Take 1 tablet (1 g total) by mouth 4 (four) times daily -  with meals  and at bedtime. (Patient not taking: Reported on 07/21/2021) 90 tablet 3  . trifluridine-tipiracil (LONSURF) 15-6.14 MG tablet Take 5 tablets (75 mg of trifluridine total) by mouth 2 (two) times daily after a meal. 1 hr after AM & PM meals on days 1-5, 8-12. Repeat every 28day (Patient not taking: No sig reported) 100 tablet    No current facility-administered medications for this visit.      Marland Kitchen  PHYSICAL EXAMINATION: ECOG PERFORMANCE STATUS: 0 - Asymptomatic  Vitals:   07/21/21 0844  BP: (!) 155/80  Pulse: 67  Resp: 20  Temp: 97.8 F (36.6 C)  SpO2: 100%   Filed Weights   07/21/21 0844  Weight: 184 lb 6.4 oz (83.6 kg)    Physical Exam HENT:     Head: Normocephalic and atraumatic.     Mouth/Throat:     Pharynx: No oropharyngeal exudate.     Comments: Whitish patches noted right side.  Eyes:     Pupils: Pupils are equal, round, and reactive to light.  Cardiovascular:     Rate and Rhythm: Normal rate and regular rhythm.  Pulmonary:     Effort: No respiratory distress.     Breath sounds: No wheezing.     Comments: Decreased breath sounds bilaterally.  No wheeze or crackles Abdominal:     General: Bowel sounds are normal. There is no distension.     Palpations: Abdomen is soft. There is no mass.     Tenderness: There is no abdominal tenderness. There is no guarding or rebound.  Musculoskeletal:        General: No tenderness. Normal range of motion.     Cervical back: Normal range of motion and neck supple.  Skin:    General: Skin is warm.  Neurological:     Mental Status: He is alert and oriented to person, place, and time.  Psychiatric:        Mood and Affect: Affect normal.   LABORATORY DATA:  I have reviewed the data as listed Lab Results  Component Value Date   WBC 1.9 (L) 07/21/2021   HGB 10.8 (L) 07/21/2021   HCT 32.0 (L) 07/21/2021   MCV 101.3 (H) 07/21/2021   PLT 73 (L) 07/21/2021   Recent Labs    07/22/20 0836 08/05/20 0821 06/25/21 1024  07/07/21 0906 07/21/21 0832  NA 138   < > 135 137 138  K 3.6   < > 3.7 3.8 3.7  CL 107   < > 105 104 109  CO2 25   < > $R'26 26 25  'KS$ GLUCOSE 195*   < > 195* 194* 197*  BUN 10   < > $R'13 14 14  'fc$ CREATININE 0.89   < > 0.84 0.90 0.73  CALCIUM 8.1*   < > 8.3* 8.4* 8.1*  GFRNONAA >60   < > >60 >60 >60  GFRAA >60  --   --   --   --   PROT 6.7   < > 7.7 7.7 7.3  ALBUMIN 3.1*   < > 3.1* 3.0* 2.8*  AST 29   < > 35 35 37  ALT 19   < > $R'17 16 18  'tm$ ALKPHOS 108   < > 173* 162* 147*  BILITOT 0.8   < > 0.7 1.1 1.1   < > = values in this interval not displayed.    RADIOGRAPHIC STUDIES: I have personally reviewed the radiological images as listed and agreed with the findings in the report. MR LIVER W WO CONTRAST  Result Date: 07/20/2021 CLINICAL DATA:  MRI liver with colon cancer mets, follow-up ablation EXAM: MRI ABDOMEN WITHOUT AND WITH CONTRAST TECHNIQUE: Multiplanar multisequence MR imaging of the abdomen was performed both before and after the administration of intravenous contrast. CONTRAST:  7.73mL GADAVIST GADOBUTROL 1 MMOL/ML IV SOLN COMPARISON:  MRI abdomen dated Feb 27, 2021; CT chest abdomen and pelvis dated November 09, 2019 FINDINGS: Lower chest: New moderate left pleural effusion with multiple new pleural nodules. Reference nodule of the left pleura measuring 5.6 x 2.5 cm on series 4, image 20. Hepatobiliary: Lesion at the liver dome measuring approximately 7.6 x 7.0 cm on series 20, image 20, previously measured 7.0 x 6.8 cm, interval resolution of heterogeneous peripheral contrast enhancement, internal T1 hyperintensity which likely represents blood products. Ablation site of the anterior left lobe of the liver in segment II/III demonstrates no residual contrast enhancement, series 20 image 35. Lesion of hepatic segment VI on series 20, image 46 is decreased in size compared to prior and demonstrates no residual contrast enhancement, measures 2.3 x 1.9 cm, previously 3.1 x 2.2 cm. Lesion in segment VI  on series 20, image 54 is decreased in size and demonstrates no residual contrast enhancement, measures 1.2 cm, previously 1.9 x 1.6 cm. Image normal gallbladder. Unchanged dilation of the common bile duct measuring up to 9 mm. Pancreas: No mass, inflammatory changes, or other parenchymal abnormality identified. Spleen:  Within normal limits in size and appearance. Adrenals/Urinary Tract: Heterogeneous solid lesion of the upper pole of the right kidney measuring 2.8 x 1.7 cm on series 20, image 55, previously measured 1.7 x 1.4 cm. No evidence of hydronephrosis. Stomach/Bowel: Visualized portions demonstrate no evidence of obstruction or wall thickening.Peritoneal implant of the right lower quadrant measuring 3.2 x 2.4 cm seen just inferior to area of right hemicolectomy. Vascular/Lymphatic: New enlarged periportal and retroperitoneal lymph nodes. Reference periportal lymph node measuring 1.5 cm in short axis on series 15, image 41, previously measured 5 mm. New enlarged pericaval lymph node measuring 1.8 cm in short axis on series 15, image 49. Other: Soft tissue nodule of the right lower quadrant measuring 3.2 x 2.4 cm seen adjacent to several small bowel loops. Musculoskeletal: No suspicious bone lesions identified. IMPRESSION: Treated lesions of the hepatic dome and segment VI demonstrate no residual contrast enhancement. New moderate left pleural effusion with multiple new large pleural nodules, concerning for pleural metastatic disease. New enlarged periportal  and retroperitoneal lymph nodes, findings are concerning for progressive metastatic disease. Increased size of heterogeneous solid lesion of the upper pole of the right kidney, concerning for additional site of metastatic disease. Peritoneal implant of the right lower quadrant seen just inferior to area of right hemicolectomy is increased in size when compared with prior CT. Comparison with prior MRI is limited due to differences in scan field-of-view,  but possibly slightly increased in size. Electronically Signed   By: Yetta Glassman M.D.   On: 07/20/2021 14:47     ASSESSMENT & PLAN:   Cancer of right colon ([place]) #Right-sided colon adenocarcinoma-with synchronous metastasis to liver/unresectable.   s/p Ablation- MAY 2022 MRI- Multiple lvier lesions/progression-patient underwent s/p Y 90 on 06/17; awaiting repeat MRI in late sep, 2022 [IR].  Currently on oral TAS-102 (*35 mg/m2 twice daily on days 1-5 and 8-12 every 28 days].    # proceed with cycle #3 day-15 - Zirabev. Labs today reviewed;  acceptable for treatment today; except UA- 100 protein. S/p Lonsurf [ WBC- 1.9; platelets- 73; Hb 10.8]. continue with current dose reduced to 4 [14x4=60] pills BID- M-F  # UA protein: 100- check Urine protein ratio today; Protein Creatinine Ratio: 0.05-STABLE.  # Hypoalbuminemia/malnutrition: recommend Ensure/proetin intake. :    # PN- G-1-2; sec to oxaliplatin- STABLE>  # Mediport- functioning- STABLE.   # Shoulder pain-refill oxycodone prn Silver.[place] ]- STABLE.   # DISPOSITION:   # zirabev today.  # follow up in 2 weeks-  MD; labs- cbc/cmp CEA/UA; zirabev-Dr.B    All questions were answered. The patient knows to call the clinic with any problems, questions or concerns.    Cammie Sickle, MD 07/21/2021 4:28 PM

## 2021-07-22 ENCOUNTER — Encounter: Payer: Self-pay | Admitting: *Deleted

## 2021-07-22 ENCOUNTER — Ambulatory Visit
Admission: RE | Admit: 2021-07-22 | Discharge: 2021-07-22 | Disposition: A | Discharge status: Home / Self Care | Payer: Medicare HMO | Source: Ambulatory Visit | Attending: Interventional Radiology | Admitting: Interventional Radiology

## 2021-07-22 ENCOUNTER — Other Ambulatory Visit: Payer: Self-pay

## 2021-07-22 DIAGNOSIS — Z9889 Other specified postprocedural states: Secondary | ICD-10-CM | POA: Diagnosis not present

## 2021-07-22 DIAGNOSIS — C189 Malignant neoplasm of colon, unspecified: Secondary | ICD-10-CM | POA: Diagnosis not present

## 2021-07-22 DIAGNOSIS — C787 Secondary malignant neoplasm of liver and intrahepatic bile duct: Secondary | ICD-10-CM | POA: Diagnosis not present

## 2021-07-22 HISTORY — PX: IR RADIOLOGIST EVAL & MGMT: IMG5224

## 2021-07-22 LAB — CEA: CEA: 89.1 ng/mL — ABNORMAL HIGH (ref 0.0–4.7)

## 2021-07-22 NOTE — Progress Notes (Signed)
Chief Complaint: Patient was consulted remotely today (TeleHealth) for right sided colon cancer metastatic to the liver at the request of Azyah Flett K.    Referring Physician(s): Dr. Charlaine Dalton  History of Present Illness: Oscar [name] is a 80 y.o. 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   Patient underwent neoadjuvant chemotherapy and right hemicolectomy (May 2020).  Hepatic metastatic disease also confirmed by biopsy in May 2020.  CT imaging in September 2020 showed progression of metastatic disease.  FOLFOX + Avastin begun Oct 2020.  In Sept 2021, CT imaging showed progression of disease and he was transitioned to FOLFIRI + Avastin.   MRI 11/27/20 shows stable hepatic disease with 2 discrete lesions (Seg 2/3 4.4 x 4.2 cm and Seg 6 1.2 cm).  However, Oscar [name] also developed a peptic ulcer and has been begun on carafate and PPI.  Further chemo held for now and patient is referred for evaluation for liver directed therapy.   Of note, CT CAP from 09/24/20 also shows pulmonary metastatic disease and a peritoneal implant just caudal to the prior hemicolectomy anastomosis.     Oscar [name] underwent bland embolization of the dominant lesion in the left hepatic lobe on 12/19/2020 followed by percutaneous thermal ablation on 01/15/2021.  The smaller lesion in hepatic segment 6 was not well visualized at that time and was not targeted.   MRI dated 02/27/21:  1. Ablation site of the anterior left lobe of the liver demonstrates no residual contrast enhancement. 2. Marked interval enlargement of a rim enhancing,metastatic lesion of the inferior right lobe of the liver, hepatic segment VI, measuring 3.3 x 2.8 cm, previously 1.1 cm. 3. New metastatic lesion just inferior, in the tip of the right lobe of the liver, hepatic segment VI, measuring 2.4 x 2.2 cm. 4. Redemonstrated lesion of the liver dome measuring approximately 7.0 x 6.8 cm. Although not  changed in size and previously described as a simple cyst, this lesion demonstrates new internal complexity and substantial heterogeneous peripheral contrast enhancement not seen on prior examination, concerning for metastatic lesion.  Note that this lesion was not present on examination dated 04/16/2018 and developed on examination dated 03/06/2019, not typical in behavior for a benign liver cyst.   04/04/21 - Right lobar TARE with Y90 resin microspheres.   MRI 07/20/21 1.) Treated lesions of the hepatic dome and segment VI demonstrate no residual contrast enhancement. 2.)  Evidence of progressive extrahepatic metastatic disease including multiple new large left pleural nodules, likely malignant left pleural effusion, and large periportal and retroperitoneal lymph nodes, right upper pole perinephric mass and currently has peritoneal implant in the right lower quadrant.   Oscar [name] reports that he is feeling well.  He was tired and fatigued for several days following his Y 90 treatment but that has since resolved.  His appetite remains good.  He denies abdominal pain, nausea, vomiting or other systemic symptoms.  Past Medical History:  Diagnosis Date   Arthritis    Cancer ([place])    colon cancer 02/2019 per pt    Family history of adverse reaction to anesthesia    cousin took all night to wake up from anesthesia   H/O colon cancer, stage IV    Hyperlipemia    Neuromuscular disorder ([place])    Pre-diabetes     Past Surgical History:  Procedure Laterality Date   COLON SURGERY     ESOPHAGOGASTRODUODENOSCOPY (EGD) WITH PROPOFOL N/A 12/26/2020   Procedure:  ESOPHAGOGASTRODUODENOSCOPY (EGD) WITH PROPOFOL;  Surgeon: [name], MD;  Location: [facility] END[facility]COPY;  Service: Gastroenterology;  Laterality: N/A;   IR ANGIOGRAM SELECTIVE EACH ADDITIONAL VESSEL  12/19/2020   IR ANGIOGRAM SELECTIVE EACH ADDITIONAL VESSEL  03/24/2021   IR ANGIOGRAM SELECTIVE EACH ADDITIONAL VESSEL  03/24/2021   IR ANGIOGRAM  SELECTIVE EACH ADDITIONAL VESSEL  04/04/2021   IR ANGIOGRAM SELECTIVE EACH ADDITIONAL VESSEL  04/04/2021   IR ANGIOGRAM SELECTIVE EACH ADDITIONAL VESSEL  04/04/2021   IR ANGIOGRAM VISCERAL SELECTIVE  12/19/2020   IR ANGIOGRAM VISCERAL SELECTIVE  03/24/2021   IR ANGIOGRAM VISCERAL SELECTIVE  04/04/2021   IR ANGIOGRAM VISCERAL SELECTIVE  04/04/2021   IR EMBO ARTERIAL NOT HEMORR HEMANG INC GUIDE ROADMAPPING  03/24/2021   IR EMBO TUMOR ORGAN ISCHEMIA INFARCT INC GUIDE ROADMAPPING  12/19/2020   IR EMBO TUMOR ORGAN ISCHEMIA INFARCT INC GUIDE ROADMAPPING  04/04/2021   IR IMAGING GUIDED PORT INSERTION  07/20/2019   IR RADIOLOGIST EVAL & MGMT  12/05/2020   IR RADIOLOGIST EVAL & MGMT  02/11/2021   IR RADIOLOGIST EVAL & MGMT  03/04/2021   IR RADIOLOGIST EVAL & MGMT  04/29/2021   IR RADIOLOGIST EVAL & MGMT  07/22/2021   IR US GUIDE VASC ACCESS RIGHT  12/19/2020   IR US GUIDE VASC ACCESS RIGHT  03/24/2021   IR US GUIDE VASC ACCESS RIGHT  04/04/2021   JOINT REPLACEMENT Left 2010   Knee   RADIOLOGY WITH ANESTHESIA N/A 01/15/2021   Procedure: CT WITH ANESTHESIA MICROWAVE ABLATION OF LIVER;  Surgeon: Criselda Peaches, MD;  Location: [facility] ORS;  Service: Anesthesiology;  Laterality: N/A;    Allergies: Ace inhibitors  Medications: Prior to Admission medications   Medication Sig Start Date End Date Taking? Authorizing Provider  amLODipine (NORVASC) 5 MG tablet Take 5 mg by mouth daily.    [provider]  ondansetron (ZOFRAN) 4 MG tablet Take 1 tablet (4 mg total) by mouth every 8 (eight) hours as needed for nausea or vomiting. Patient not taking: No sig reported 01/15/21   Ardis Rowan, PA-C  ondansetron [facility]) 8 MG tablet Take 1 tablet (8 mg total) by mouth every 8 (eight) hours as needed for nausea or vomiting. Patient not taking: No sig reported 12/19/20   Louk, Bea Graff, PA-C  oxyCODONE (OXY IR/ROXICODONE) 5 MG immediate release tablet Take 1 tablet (5 mg total) by mouth every 6 (six) hours as needed  for severe pain. 06/30/21   Borders, Kirt Boys, NP  pravastatin (PRAVACHOL) 40 MG tablet Take 40 mg by mouth daily.  Patient not taking: No sig reported    [provider]  prochlorperazine (COMPAZINE) 10 MG tablet Take 1 tablet (10 mg total) by mouth every 6 (six) hours as needed for nausea or vomiting. Patient not taking: No sig reported 11/21/20   Borders, Kirt Boys, NP  sucralfate (CARAFATE) 1 g tablet Take 1 tablet (1 g total) by mouth 4 (four) times daily -  with meals and at bedtime. Patient not taking: Reported on 07/21/2021 06/09/21   Cammie Sickle, MD  tamsulosin (FLOMAX) 0.4 MG CAPS capsule Take 1 capsule (0.4 mg total) by mouth daily. 01/27/21 01/27/22  Borders, Kirt Boys, NP  trifluridine-tipiracil (LONSURF) 15-6.14 MG tablet Take 5 tablets (75 mg of trifluridine total) by mouth 2 (two) times daily after a meal. 1 hr after AM & PM meals on days 1-5, 8-12. Repeat every 28day Patient not taking: No sig reported 05/07/21   Cammie Sickle, MD  Family History  Problem Relation Age of Onset   Peptic Ulcer Disease Father     Social History   Socioeconomic History   Marital status: Married    Spouse name: Not on file   Number of children: Not on file   Years of education: Not on file   Highest education level: Not on file  Occupational History   Not on file  Tobacco Use   Smoking status: Every Day    Packs/day: 0.25    Years: 15.00    Pack years: 3.75    Types: Cigarettes   Smokeless tobacco: Never   Tobacco comments:    1 to 2 cigarettes a day occasionally  Vaping Use   Vaping Use: Never used  Substance and Sexual Activity   Alcohol use: Yes    Comment: rarely    Drug use: No   Sexual activity: Not on file  Other Topics Concern   Not on file  Social History Narrative   Recruitment consultant retd; lives in [place]; smoking 3cig/day; [3/4 ppd x started at 9 years]; no alcohol. Son & daughter; wife dementia [waiting for placement].    Social  Determinants of Health   Financial Resource Strain: Not on file  Food Insecurity: Not on file  Transportation Needs: Not on file  Physical Activity: Not on file  Stress: Not on file  Social Connections: Not on file    ECOG Status: 1 - Symptomatic but completely ambulatory  Review of Systems  Review of Systems: A 12 point ROS discussed and pertinent positives are indicated in the HPI above.  All other systems are negative.  Physical Exam No direct physical exam was performed (except for noted visual exam findings with Video Visits).   Vital Signs: There were no vitals taken for this visit.  Imaging: MR LIVER W WO CONTRAST  Result Date: 07/20/2021 CLINICAL DATA:  MRI liver with colon cancer mets, follow-up ablation EXAM: MRI ABDOMEN WITHOUT AND WITH CONTRAST TECHNIQUE: Multiplanar multisequence MR imaging of the abdomen was performed both before and after the administration of intravenous contrast. CONTRAST:  7.79mL GADAVIST GADOBUTROL 1 MMOL/ML IV SOLN COMPARISON:  MRI abdomen dated Feb 27, 2021; CT chest abdomen and pelvis dated November 09, 2019 FINDINGS: Lower chest: New moderate left pleural effusion with multiple new pleural nodules. Reference nodule of the left pleura measuring 5.6 x 2.5 cm on series 4, image 20. Hepatobiliary: Lesion at the liver dome measuring approximately 7.6 x 7.0 cm on series 20, image 20, previously measured 7.0 x 6.8 cm, interval resolution of heterogeneous peripheral contrast enhancement, internal T1 hyperintensity which likely represents blood products. Ablation site of the anterior left lobe of the liver in segment II/III demonstrates no residual contrast enhancement, series 20 image 35. Lesion of hepatic segment VI on series 20, image 46 is decreased in size compared to prior and demonstrates no residual contrast enhancement, measures 2.3 x 1.9 cm, previously 3.1 x 2.2 cm. Lesion in segment VI on series 20, image 54 is decreased in size and demonstrates no  residual contrast enhancement, measures 1.2 cm, previously 1.9 x 1.6 cm. Image normal gallbladder. Unchanged dilation of the common bile duct measuring up to 9 mm. Pancreas: No mass, inflammatory changes, or other parenchymal abnormality identified. Spleen:  Within normal limits in size and appearance. Adrenals/Urinary Tract: Heterogeneous solid lesion of the upper pole of the right kidney measuring 2.8 x 1.7 cm on series 20, image 55, previously measured 1.7 x 1.4 cm. No evidence of hydronephrosis.  Stomach/Bowel: Visualized portions demonstrate no evidence of obstruction or wall thickening.Peritoneal implant of the right lower quadrant measuring 3.2 x 2.4 cm seen just inferior to area of right hemicolectomy. Vascular/Lymphatic: New enlarged periportal and retroperitoneal lymph nodes. Reference periportal lymph node measuring 1.5 cm in short axis on series 15, image 41, previously measured 5 mm. New enlarged pericaval lymph node measuring 1.8 cm in short axis on series 15, image 49. Other: Soft tissue nodule of the right lower quadrant measuring 3.2 x 2.4 cm seen adjacent to several small bowel loops. Musculoskeletal: No suspicious bone lesions identified. IMPRESSION: Treated lesions of the hepatic dome and segment VI demonstrate no residual contrast enhancement. New moderate left pleural effusion with multiple new large pleural nodules, concerning for pleural metastatic disease. New enlarged periportal and retroperitoneal lymph nodes, findings are concerning for progressive metastatic disease. Increased size of heterogeneous solid lesion of the upper pole of the right kidney, concerning for additional site of metastatic disease. Peritoneal implant of the right lower quadrant seen just inferior to area of right hemicolectomy is increased in size when compared with prior CT. Comparison with prior MRI is limited due to differences in scan field-of-view, but possibly slightly increased in size. Electronically Signed    By: Yetta Glassman M.D.   On: 07/20/2021 14:47   IR Radiologist Eval & Mgmt  Result Date: 07/22/2021 Please refer to notes tab for details about interventional procedure. (Op Note)   Labs:  CBC: Recent Labs    06/16/21 1011 06/25/21 1024 07/07/21 0906 07/21/21 0832  WBC 3.0* 3.8* 4.2 1.9*  HGB 11.7* 11.1* 11.9* 10.8*  HCT 35.3* 33.2* 36.1* 32.0*  PLT 149* 141* 117* 73*    COAGS: Recent Labs    12/19/20 0954 01/15/21 0739 03/24/21 0825 04/04/21 0811  INR 1.0 0.9 1.1 1.2  APTT  --   --   --  40*    BMP: Recent Labs    06/09/21 0830 06/25/21 1024 07/07/21 0906 07/21/21 0832  NA 136 135 137 138  K 3.6 3.7 3.8 3.7  CL 105 105 104 109  CO2 25 26 26 25   GLUCOSE 211* 195* 194* 197*  BUN 9 13 14 14   CALCIUM 8.0* 8.3* 8.4* 8.1*  CREATININE 0.89 0.84 0.90 0.73  GFRNONAA >60 >60 >60 >60    LIVER FUNCTION TESTS: Recent Labs    06/09/21 0830 06/25/21 1024 07/07/21 0906 07/21/21 0832  BILITOT 1.1 0.7 1.1 1.1  AST 34 35 35 37  ALT 16 17 16 18   ALKPHOS 192* 173* 162* 147*  PROT 7.2 7.7 7.7 7.3  ALBUMIN 2.9* 3.1* 3.0* 2.8*    TUMOR MARKERS: No results for input(s): AFPTM, CEA, CA199, CHROMGRNA in the last 8760 hours.  Assessment and Plan:  80 year old male with cecal cancer metastatic to the liver now status post combined bland embolization after microwave ablation of left hepatic lesion as well as transarterial radio embolization of multifocal disease in the right hepatic lobe.  Follow-up MRI from this week demonstrates successful local directed therapies without evidence of residual enhancement in any of the treated lesions.  Unfortunately, there is evidence of progressive extrahepatic metastatic disease including new left pleural nodules and likely malignant pleural effusion, retroperitoneal adenopathy, a right upper pole perinephric implants, and the right lower quadrant peritoneal implant.  I discussed these findings with Oscar [name] today over the  telephone.  He has a follow-up appointment with Dr. Rogue Bussing in approximately 2 weeks.  1.)  Follow-up MRI abdomen with gadolinium contrast  in 3 months with accompanying clinic visit.    Electronically Signed: Criselda Peaches 07/22/2021, 4:34 PM   I spent a total of  25 Minutes in remote  clinical consultation, greater than 50% of which was counseling/coordinating care for colon cancer metastatic to the liver.    Visit type: Audio only (telephone). Audio (no video) only due to patient preference. Alternative for in-person consultation at [facility], [address], [place], [place]. This visit type was conducted due to national recommendations for restrictions regarding the COVID-19 Pandemic (e.g. social distancing).  This format is felt to be most appropriate for this patient at this time.  All issues noted in this document were discussed and addressed.

## 2021-07-25 ENCOUNTER — Encounter: Payer: Self-pay | Admitting: Internal Medicine

## 2021-07-30 ENCOUNTER — Inpatient Hospital Stay (HOSPITAL_BASED_OUTPATIENT_CLINIC_OR_DEPARTMENT_OTHER): Payer: Medicare HMO | Admitting: Hospice and Palliative Medicine

## 2021-07-30 ENCOUNTER — Other Ambulatory Visit: Payer: Self-pay

## 2021-07-30 DIAGNOSIS — Z515 Encounter for palliative care: Secondary | ICD-10-CM

## 2021-07-30 DIAGNOSIS — C182 Malignant neoplasm of ascending colon: Secondary | ICD-10-CM | POA: Diagnosis not present

## 2021-07-30 NOTE — Progress Notes (Signed)
Virtual Visit via Telephone Note  I connected with Oscar [name] on 07/30/21 at  2:15 PM EDT by telephone and verified that I am speaking with the correct person using two identifiers.  Location: Patient: Home Provider: Clinic   I discussed the limitations, risks, security and privacy concerns of performing an evaluation and management service by telephone and the availability of in person appointments. I also discussed with the patient that there may be a patient responsible charge related to this service. The patient expressed understanding and agreed to proceed.   History of Present Illness: [name] is a 80 y.o. male with multiple medical problems including stage IV colon cancer s/p FOLFIRI plus Avastin chemotherapy now on oral TAS-102.  Patient underwent embolization to liver lesions on 01/15/2021.  He is status post Y 90 to liver lesions on 04/04/2021.Patient was referred to palliative care to help address goals and manage ongoing symptoms.   Observations/Objective: I spoke with patient by phone.  He reports that he is doing well.  He denies any significant changes or symptomatic complaints.  No issues with medications nor need for refills today.  Unfortunately, MRI on 07/18/2021 revealed new pleural nodules and enlarging periportal and retroperitoneal lymph nodes and increasing size of right kidney mass concerning for progressive metastatic disease.  Assessment and Plan: Metastatic colon cancer -status post liver embolization/Y 90.  Symptomatically appears to be doing well.  MRI suggestive of disease progression.  We will follow  Neoplasm related pain -continue oxycodone as needed  Follow Up Instructions: Follow-up MyChart visit 1 month   I discussed the assessment and treatment plan with the patient. The patient was provided an opportunity to ask questions and all were answered. The patient agreed with the plan and demonstrated an understanding of the instructions.    The patient was advised to call back or seek an in-person evaluation if the symptoms worsen or if the condition fails to improve as anticipated.  I provided 5 minutes of non-face-to-face time during this encounter.   [name], NP

## 2021-08-04 ENCOUNTER — Other Ambulatory Visit: Payer: Self-pay

## 2021-08-04 ENCOUNTER — Inpatient Hospital Stay: Payer: Medicare HMO | Admitting: Internal Medicine

## 2021-08-04 ENCOUNTER — Inpatient Hospital Stay: Payer: Medicare HMO

## 2021-08-04 ENCOUNTER — Encounter: Payer: Self-pay | Admitting: Internal Medicine

## 2021-08-04 DIAGNOSIS — C182 Malignant neoplasm of ascending colon: Secondary | ICD-10-CM | POA: Diagnosis not present

## 2021-08-04 DIAGNOSIS — F17210 Nicotine dependence, cigarettes, uncomplicated: Secondary | ICD-10-CM | POA: Diagnosis not present

## 2021-08-04 DIAGNOSIS — Z7189 Other specified counseling: Secondary | ICD-10-CM

## 2021-08-04 DIAGNOSIS — C787 Secondary malignant neoplasm of liver and intrahepatic bile duct: Secondary | ICD-10-CM | POA: Diagnosis not present

## 2021-08-04 DIAGNOSIS — Z5112 Encounter for antineoplastic immunotherapy: Secondary | ICD-10-CM | POA: Diagnosis not present

## 2021-08-04 LAB — CBC WITH DIFFERENTIAL/PLATELET
Abs Immature Granulocytes: 0 10*3/uL (ref 0.00–0.07)
Basophils Absolute: 0 10*3/uL (ref 0.0–0.1)
Basophils Relative: 1 %
Eosinophils Absolute: 0 10*3/uL (ref 0.0–0.5)
Eosinophils Relative: 3 %
HCT: 31.1 % — ABNORMAL LOW (ref 39.0–52.0)
Hemoglobin: 10.5 g/dL — ABNORMAL LOW (ref 13.0–17.0)
Immature Granulocytes: 0 %
Lymphocytes Relative: 48 %
Lymphs Abs: 0.6 10*3/uL — ABNORMAL LOW (ref 0.7–4.0)
MCH: 35.1 pg — ABNORMAL HIGH (ref 26.0–34.0)
MCHC: 33.8 g/dL (ref 30.0–36.0)
MCV: 104 fL — ABNORMAL HIGH (ref 80.0–100.0)
Monocytes Absolute: 0.2 10*3/uL (ref 0.1–1.0)
Monocytes Relative: 18 %
Neutro Abs: 0.4 10*3/uL — CL (ref 1.7–7.7)
Neutrophils Relative %: 30 %
Platelets: 149 10*3/uL — ABNORMAL LOW (ref 150–400)
RBC: 2.99 MIL/uL — ABNORMAL LOW (ref 4.22–5.81)
RDW: 16.7 % — ABNORMAL HIGH (ref 11.5–15.5)
WBC: 1.2 10*3/uL — CL (ref 4.0–10.5)
nRBC: 0 % (ref 0.0–0.2)

## 2021-08-04 LAB — COMPREHENSIVE METABOLIC PANEL
ALT: 18 U/L (ref 0–44)
AST: 35 U/L (ref 15–41)
Albumin: 3 g/dL — ABNORMAL LOW (ref 3.5–5.0)
Alkaline Phosphatase: 161 U/L — ABNORMAL HIGH (ref 38–126)
Anion gap: 6 (ref 5–15)
BUN: 11 mg/dL (ref 8–23)
CO2: 24 mmol/L (ref 22–32)
Calcium: 8.1 mg/dL — ABNORMAL LOW (ref 8.9–10.3)
Chloride: 106 mmol/L (ref 98–111)
Creatinine, Ser: 0.82 mg/dL (ref 0.61–1.24)
GFR, Estimated: >60 mL/min (ref >60)
Glucose, Bld: 190 mg/dL — ABNORMAL HIGH (ref 70–99)
Potassium: 3.5 mmol/L (ref 3.5–5.1)
Sodium: 136 mmol/L (ref 135–145)
Total Bilirubin: 1 mg/dL (ref 0.3–1.2)
Total Protein: 7.4 g/dL (ref 6.5–8.1)

## 2021-08-04 LAB — URINALYSIS, COMPLETE (UACMP) WITH MICROSCOPIC
Bacteria, UA: NONE SEEN
Bilirubin Urine: NEGATIVE
Glucose, UA: NEGATIVE mg/dL
Hgb urine dipstick: NEGATIVE
Ketones, ur: 5 mg/dL — AB
Leukocytes,Ua: NEGATIVE
Nitrite: NEGATIVE
Protein, ur: 30 mg/dL — AB
Specific Gravity, Urine: 1.027 (ref 1.005–1.030)
pH: 5 (ref 5.0–8.0)

## 2021-08-04 MED ORDER — SODIUM CHLORIDE 0.9% FLUSH
10.0000 mL | Freq: Once | INTRAVENOUS | Status: AC
  Administered 2021-08-04: 10 mL via INTRAVENOUS
  Filled 2021-08-04: qty 10

## 2021-08-04 MED ORDER — SODIUM CHLORIDE 0.9 % IV SOLN
5.0000 mg/kg | Freq: Once | INTRAVENOUS | Status: AC
  Administered 2021-08-04: 400 mg via INTRAVENOUS
  Filled 2021-08-04: qty 16

## 2021-08-04 MED ORDER — SODIUM CHLORIDE 0.9 % IV SOLN
Freq: Once | INTRAVENOUS | Status: AC
  Filled 2021-08-04: qty 250

## 2021-08-04 MED ORDER — HEPARIN SOD (PORK) LOCK FLUSH 100 UNIT/ML IV SOLN
INTRAVENOUS | Status: AC
  Administered 2021-08-04: 500 [IU]
  Filled 2021-08-04: qty 5

## 2021-08-04 NOTE — Patient Instructions (Signed)
#  Hold starting the chemo pills for the next 2 weeks.  Will reevaluate at next visit-give recommendations regarding restarting.

## 2021-08-04 NOTE — Progress Notes (Signed)
Oral chemo is supposed to be delivered today by 12 noon. Denies any real side effects. Gained some weight. Energy is fair. Denies pain. Mild tingling in fingers. MD aware of critical WBC 1.2 ANC 0.4.

## 2021-08-04 NOTE — Progress Notes (Signed)
On October 17 I spoke to patient's stepdaughter Haynes Dage that patient MRI liver Ordered by interventional radiology showed progress to disease around the lungs/pool effusion/retroperitoneal lymph nodes/pre-portal  apathy and the omental nodule. However liver lesions are stable status post IR best local therapy.  Too early to assess response to current therapy. Again response rates are are 20-30% also discussed patient life expecta ncy is six months to one year.   Also updated regarding ongoing neutropenia/need to hold chemo pills

## 2021-08-04 NOTE — Progress Notes (Signed)
[facility] NOTE  Patient Care Team: Sofie Hartigan, MD as PCP - General (Family Medicine) Cammie Sickle, MD as Consulting Physician (Hematology and Oncology)  CHIEF COMPLAINTS/PURPOSE OF CONSULTATION: Colon cancer  #  Oncology History Overview Note  # MAY 2020- 3. 03/09/19 Liver biopsy. Microscopic examination shows malignant cells with glandular architecture consistent with adenocarcinoma. The malignant cells are positive for CK20 and CDX-2. These findings support the clinical impression of metastatic colon adenocarcinoma. 4. 03/10/19 R hemicolectomy. Tumor site cecum. Adenocarcinoma. Mucinous features present. G2. No tumor deposits. Invades visceral peritoneum. No tumor perforation. LVI present. PNI not identified. All margins uninvolved. 1/12 LNs. PT4apN1. Periappendiceal inflammation c/w resolving abscess. Microsatellite stable (MSS). [Dr.Mettu; [facility]]  # SEP 4th 2020 [compared to May 2020]  Interval increase in size of the metastases to the hepatic dome, The metastasis to the left hepatic lobe is unchanged; 2.  New subcentimeter hypoattenuating lesion in the inferior right hepatic lobe, incompletely characterized on CT. 3.  Postsurgical changes following right hemicolectomy.  # OCT 2020- FOLFOX +avastin; CT dec 22nd 2020- [compared to [facility] sep 9th 2020]-Liver- slight progression versus stable disease; CT scan SEP 4th 2021- Progressive disease-left lower lobe lung nodule 8 mm [previously 4 mm]; increase in size of the hepatic metastases by few millimeters.;  Soft tissue nodule adjacent to anastomotic site again increased by few millimeters.STOP FOLFOX; cont avastin  #  SEP 20th, 2021- FOLFIRI+ AVASTIN; HOLD FEB 2022- sec to poor tolerance [peptic ulcer]; 3/30-liver ablation-   [bland embolization of segment 2/3 liver lesion on 12/19/2020;  microwave ablation of both liver lesions.  # July 25th, 2022-   # FEB /16-EGD [Dr.Anna-duodenal ulcer-? meloxicam]  #  JAN 2022- COVID [s/p Mab infusion; pills; skin rash resolved.]  # NGS/F-ONE-MUTATED K-RAS [G]  # PALLIATIVE CARE EVALUATION: 09/20/2019-[name]  # PAIN MANAGEMENT: NA   DIAGNOSIS: COLON CANCER  STAGE:  IV     ;  GOALS:Palliative  CURRENT/MOST RECENT THERAPY : FOLFIRI+ avastin [C]    Cancer of right colon ([place])  07/05/2019 Initial Diagnosis   Cancer of right colon ([place])   07/24/2019 - 06/25/2020 Chemotherapy   The patient had dexamethasone (DECADRON) 4 MG tablet, 8 mg, Oral, Daily, 1 of 1 cycle, Start date: --, End date: -- palonosetron (ALOXI) injection 0.25 mg, 0.25 mg, Intravenous,  Once, 23 of 25 cycles Administration: 0.25 mg (07/24/2019), 0.25 mg (08/07/2019), 0.25 mg (08/21/2019), 0.25 mg (09/04/2019), 0.25 mg (09/20/2019), 0.25 mg (10/04/2019), 0.25 mg (10/16/2019), 0.25 mg (10/30/2019), 0.25 mg (11/22/2019), 0.25 mg (12/06/2019), 0.25 mg (12/20/2019), 0.25 mg (01/03/2020), 0.25 mg (01/29/2020), 0.25 mg (02/12/2020), 0.25 mg (02/26/2020), 0.25 mg (03/11/2020), 0.25 mg (03/25/2020), 0.25 mg (04/08/2020), 0.25 mg (04/24/2020), 0.25 mg (05/08/2020), 0.25 mg (05/22/2020), 0.25 mg (06/10/2020), 0.25 mg (06/25/2020) leucovorin 800 mg in dextrose 5 % 250 mL infusion, 844 mg, Intravenous,  Once, 23 of 25 cycles Administration: 800 mg (07/24/2019), 800 mg (08/07/2019), 800 mg (08/21/2019), 800 mg (09/04/2019), 800 mg (09/20/2019), 800 mg (10/04/2019), 800 mg (10/16/2019), 800 mg (10/30/2019), 800 mg (11/22/2019), 800 mg (12/06/2019), 800 mg (12/20/2019), 800 mg (01/03/2020), 800 mg (01/29/2020), 800 mg (02/12/2020), 800 mg (02/26/2020), 800 mg (03/11/2020), 800 mg (03/25/2020), 800 mg (04/08/2020), 800 mg (04/24/2020), 800 mg (05/08/2020), 800 mg (05/22/2020), 800 mg (06/10/2020), 800 mg (06/25/2020) oxaliplatin (ELOXATIN) 180 mg in dextrose 5 % 500 mL chemo infusion, 85 mg/m2 = 180 mg, Intravenous,  Once, 23 of 25 cycles Dose modification: 178 mg (original dose 85 mg/m2, Cycle 17, Reason:  Other (see comments), Comment: insurance adjusted dose  ) Administration: 180 mg (07/24/2019), 180 mg (08/07/2019), 180 mg (08/21/2019), 180 mg (09/04/2019), 180 mg (09/20/2019), 180 mg (10/04/2019), 180 mg (10/16/2019), 180 mg (10/30/2019), 180 mg (11/22/2019), 180 mg (12/06/2019), 180 mg (12/20/2019), 180 mg (01/03/2020), 180 mg (01/29/2020), 180 mg (02/12/2020), 180 mg (02/26/2020), 180 mg (03/11/2020), 180 mg (04/08/2020), 180 mg (04/24/2020), 180 mg (05/08/2020), 180 mg (05/22/2020), 180 mg (06/10/2020), 180 mg (06/25/2020) fluorouracil (ADRUCIL) 5,000 mg in sodium chloride 0.9 % 150 mL chemo infusion, 5,050 mg, Intravenous, 1 Day/Dose, 23 of 25 cycles Administration: 5,000 mg (07/24/2019), 5,000 mg (08/07/2019), 5,000 mg (08/21/2019), 5,050 mg (09/04/2019), 5,000 mg (10/04/2019), 5,000 mg (10/16/2019), 5,000 mg (11/22/2019), 5,000 mg (12/06/2019), 5,000 mg (12/20/2019), 5,000 mg (01/03/2020), 5,000 mg (01/29/2020), 5,000 mg (02/12/2020), 5,000 mg (02/26/2020), 5,000 mg (03/11/2020), 5,000 mg (03/25/2020), 5,000 mg (04/08/2020), 5,000 mg (04/24/2020), 5,000 mg (05/08/2020), 5,000 mg (05/22/2020), 5,000 mg (06/10/2020), 5,000 mg (06/25/2020) bevacizumab-bvzr (ZIRABEV) 400 mg in sodium chloride 0.9 % 100 mL chemo infusion, 5 mg/kg = 400 mg, Intravenous,  Once, 23 of 25 cycles Administration: 400 mg (08/07/2019), 400 mg (08/21/2019), 400 mg (09/04/2019), 400 mg (09/20/2019), 400 mg (10/04/2019), 400 mg (10/16/2019), 400 mg (10/30/2019), 400 mg (11/22/2019), 400 mg (12/06/2019), 400 mg (12/20/2019), 400 mg (01/03/2020), 400 mg (01/29/2020), 400 mg (02/12/2020), 400 mg (02/26/2020), 400 mg (03/11/2020), 400 mg (03/25/2020), 400 mg (04/08/2020), 400 mg (04/24/2020), 400 mg (05/08/2020), 400 mg (05/22/2020), 400 mg (06/10/2020), 400 mg (06/25/2020)   for chemotherapy treatment.     07/08/2020 -  Chemotherapy   Patient is on Treatment Plan : COLORECTAL FOLFIRI / BEVACIZUMAB Q14D     11/05/2020 Cancer Staging   Staging form: Colon and Rectum, AJCC 8th Edition - Clinical: Stage IVC (pM1c) - Signed by Cammie Sickle, MD on  11/05/2020     HISTORY OF PRESENTING ILLNESS: Alone.  Ambulating independently.  Caro Hight 80 y.o.  male with metastatic colon cancer to the liver- -currently on bevacizumab plus Lonsurf is here for follow-up.  Patient currently on Lonsurf-currently on cycle #2; currently on 2 weeks break.  No fever no chills no diarrhea.  No nausea no vomiting no fever no chills.  No sores in the mouth.    Review of Systems  Constitutional:  Positive for malaise/fatigue. Negative for chills, diaphoresis and fever.  HENT:  Negative for nosebleeds and sore throat.   Eyes:  Negative for double vision.  Respiratory:  Negative for cough, hemoptysis, sputum production, shortness of breath and wheezing.   Cardiovascular:  Negative for chest pain, palpitations, orthopnea and leg swelling.  Gastrointestinal:  Negative for blood in stool, constipation, heartburn, melena, nausea and vomiting.  Genitourinary:  Negative for dysuria, frequency and urgency.  Musculoskeletal:  Positive for back pain and joint pain.  Skin: Negative.  Negative for itching and rash.  Neurological:  Positive for tingling. Negative for focal weakness, weakness and headaches.  Endo/Heme/Allergies:  Does not bruise/bleed easily.  Psychiatric/Behavioral:  Negative for depression. The patient is not nervous/anxious and does not have insomnia.     MEDICAL HISTORY:  Past Medical History:  Diagnosis Date   Arthritis    Cancer ([place])    colon cancer 02/2019 per pt    Family history of adverse reaction to anesthesia    cousin took all night to wake up from anesthesia   H/O colon cancer, stage IV    Hyperlipemia    Neuromuscular disorder ([place])    Pre-diabetes     SURGICAL  HISTORY: Past Surgical History:  Procedure Laterality Date   COLON SURGERY     ESOPHAGOGASTRODUODENOSCOPY (EGD) WITH PROPOFOL N/A 12/26/2020   Procedure: ESOPHAGOGASTRODUODENOSCOPY (EGD) WITH PROPOFOL;  Surgeon: [name], MD;  Location[facility];   Service: Gastroenterology;  Laterality: N/A;   IR ANGIOGRAM SELECTIVE EACH ADDITIONAL VESSEL  12/19/2020   IR ANGIOGRAM SELECTIVE EACH ADDITIONAL VESSEL  03/24/2021   IR ANGIOGRAM SELECTIVE EACH ADDITIONAL VESSEL  03/24/2021   IR ANGIOGRAM SELECTIVE EACH ADDITIONAL VESSEL  04/04/2021   IR ANGIOGRAM SELECTIVE EACH ADDITIONAL VESSEL  04/04/2021   IR ANGIOGRAM SELECTIVE EACH ADDITIONAL VESSEL  04/04/2021   IR ANGIOGRAM VISCERAL SELECTIVE  12/19/2020   IR ANGIOGRAM VISCERAL SELECTIVE  03/24/2021   IR ANGIOGRAM VISCERAL SELECTIVE  04/04/2021   IR ANGIOGRAM VISCERAL SELECTIVE  04/04/2021   IR EMBO ARTERIAL NOT HEMORR HEMANG INC GUIDE ROADMAPPING  03/24/2021   IR EMBO TUMOR ORGAN ISCHEMIA INFARCT INC GUIDE ROADMAPPING  12/19/2020   IR EMBO TUMOR ORGAN ISCHEMIA INFARCT INC GUIDE ROADMAPPING  04/04/2021   IR IMAGING GUIDED PORT INSERTION  07/20/2019   IR RADIOLOGIST EVAL & MGMT  12/05/2020   IR RADIOLOGIST EVAL & MGMT  02/11/2021   IR RADIOLOGIST EVAL & MGMT  03/04/2021   IR RADIOLOGIST EVAL & MGMT  04/29/2021   IR RADIOLOGIST EVAL & MGMT  07/22/2021   IR US GUIDE VASC ACCESS RIGHT  12/19/2020   IR US GUIDE VASC ACCESS RIGHT  03/24/2021   IR US GUIDE VASC ACCESS RIGHT  04/04/2021   JOINT REPLACEMENT Left 2010   Knee   RADIOLOGY WITH ANESTHESIA N/A 01/15/2021   Procedure: CT WITH ANESTHESIA MICROWAVE ABLATION OF LIVER;  Surgeon: Criselda Peaches, MD;  Location: [facility] ORS;  Service: Anesthesiology;  Laterality: N/A;    SOCIAL HISTORY: Social History   Socioeconomic History   Marital status: Married    Spouse name: Not on file   Number of children: Not on file   Years of education: Not on file   Highest education level: Not on file  Occupational History   Not on file  Tobacco Use   Smoking status: Every Day    Packs/day: 0.25    Years: 15.00    Pack years: 3.75    Types: Cigarettes   Smokeless tobacco: Never   Tobacco comments:    1 to 2 cigarettes a day occasionally  Vaping Use   Vaping Use: Never used   Substance and Sexual Activity   Alcohol use: Yes    Comment: rarely    Drug use: No   Sexual activity: Not on file  Other Topics Concern   Not on file  Social History Narrative   Recruitment consultant retd; lives in [place]; smoking 3cig/day; [3/4 ppd x started at 9 years]; no alcohol. Son & daughter; wife dementia [waiting for placement].    Social Determinants of Health   Financial Resource Strain: Not on file  Food Insecurity: Not on file  Transportation Needs: Not on file  Physical Activity: Not on file  Stress: Not on file  Social Connections: Not on file  Intimate Partner Violence: Not on file    FAMILY HISTORY: Family History  Problem Relation Age of Onset   Peptic Ulcer Disease Father     ALLERGIES:  is allergic to ace inhibitors.  MEDICATIONS:  Current Outpatient Medications  Medication Sig Dispense Refill   amLODipine (NORVASC) 5 MG tablet Take 5 mg by mouth daily.     oxyCODONE (OXY IR/ROXICODONE) 5 MG immediate  release tablet Take 1 tablet (5 mg total) by mouth every 6 (six) hours as needed for severe pain. 60 tablet 0   tamsulosin (FLOMAX) 0.4 MG CAPS capsule Take 1 capsule (0.4 mg total) by mouth daily. 30 capsule 11   trifluridine-tipiracil (LONSURF) 15-6.14 MG tablet Take 5 tablets (75 mg of trifluridine total) by mouth 2 (two) times daily after a meal. 1 hr after AM & PM meals on days 1-5, 8-12. Repeat every 28day 100 tablet    ondansetron (ZOFRAN) 4 MG tablet Take 1 tablet (4 mg total) by mouth every 8 (eight) hours as needed for nausea or vomiting. (Patient not taking: No sig reported) 20 tablet 0   ondansetron (ZOFRAN) 8 MG tablet Take 1 tablet (8 mg total) by mouth every 8 (eight) hours as needed for nausea or vomiting. (Patient not taking: No sig reported) 20 tablet 0   pravastatin (PRAVACHOL) 40 MG tablet Take 40 mg by mouth daily.  (Patient not taking: No sig reported)     prochlorperazine (COMPAZINE) 10 MG tablet Take 1 tablet (10 mg total)  by mouth every 6 (six) hours as needed for nausea or vomiting. (Patient not taking: No sig reported) 60 tablet 0   sucralfate (CARAFATE) 1 g tablet Take 1 tablet (1 g total) by mouth 4 (four) times daily -  with meals and at bedtime. (Patient not taking: Reported on 07/21/2021) 90 tablet 3   No current facility-administered medications for this visit.      Marland Kitchen  PHYSICAL EXAMINATION: ECOG PERFORMANCE STATUS: 0 - Asymptomatic  Vitals:   08/04/21 0912  BP: 128/71  Pulse: 73  Resp: 17  Temp: 98.1 F (36.7 C)  SpO2: 100%   Filed Weights   08/04/21 0912  Weight: 184 lb 12.8 oz (83.8 kg)    Physical Exam HENT:     Head: Normocephalic and atraumatic.     Mouth/Throat:     Pharynx: No oropharyngeal exudate.  Eyes:     Pupils: Pupils are equal, round, and reactive to light.  Cardiovascular:     Rate and Rhythm: Normal rate and regular rhythm.  Pulmonary:     Effort: No respiratory distress.     Breath sounds: No wheezing.     Comments: Decreased breath sounds bilaterally.  No wheeze or crackles Abdominal:     General: Bowel sounds are normal. There is no distension.     Palpations: Abdomen is soft. There is no mass.     Tenderness: There is no abdominal tenderness. There is no guarding or rebound.  Musculoskeletal:        General: No tenderness. Normal range of motion.     Cervical back: Normal range of motion and neck supple.  Skin:    General: Skin is warm.  Neurological:     Mental Status: He is alert and oriented to person, place, and time.  Psychiatric:        Mood and Affect: Affect normal.   LABORATORY DATA:  I have reviewed the data as listed Lab Results  Component Value Date   WBC 1.2 (LL) 08/04/2021   HGB 10.5 (L) 08/04/2021   HCT 31.1 (L) 08/04/2021   MCV 104.0 (H) 08/04/2021   PLT 149 (L) 08/04/2021   Recent Labs    07/07/21 0906 07/21/21 0832 08/04/21 0854  NA 137 138 136  K 3.8 3.7 3.5  CL 104 109 106  CO2 $Re'26 25 24  'iRJ$ GLUCOSE 194* 197* 190*  BUN  14 14 11  CREATININE 0.90 0.73 0.82  CALCIUM 8.4* 8.1* 8.1*  GFRNONAA >60 >60 >60  PROT 7.7 7.3 7.4  ALBUMIN 3.0* 2.8* 3.0*  AST 35 37 35  ALT $Re'16 18 18  'Czg$ ALKPHOS 162* 147* 161*  BILITOT 1.1 1.1 1.0    RADIOGRAPHIC STUDIES: I have personally reviewed the radiological images as listed and agreed with the findings in the report. MR LIVER W WO CONTRAST  Result Date: 07/20/2021 CLINICAL DATA:  MRI liver with colon cancer mets, follow-up ablation EXAM: MRI ABDOMEN WITHOUT AND WITH CONTRAST TECHNIQUE: Multiplanar multisequence MR imaging of the abdomen was performed both before and after the administration of intravenous contrast. CONTRAST:  7.54mL GADAVIST GADOBUTROL 1 MMOL/ML IV SOLN COMPARISON:  MRI abdomen dated Feb 27, 2021; CT chest abdomen and pelvis dated November 09, 2019 FINDINGS: Lower chest: New moderate left pleural effusion with multiple new pleural nodules. Reference nodule of the left pleura measuring 5.6 x 2.5 cm on series 4, image 20. Hepatobiliary: Lesion at the liver dome measuring approximately 7.6 x 7.0 cm on series 20, image 20, previously measured 7.0 x 6.8 cm, interval resolution of heterogeneous peripheral contrast enhancement, internal T1 hyperintensity which likely represents blood products. Ablation site of the anterior left lobe of the liver in segment II/III demonstrates no residual contrast enhancement, series 20 image 35. Lesion of hepatic segment VI on series 20, image 46 is decreased in size compared to prior and demonstrates no residual contrast enhancement, measures 2.3 x 1.9 cm, previously 3.1 x 2.2 cm. Lesion in segment VI on series 20, image 54 is decreased in size and demonstrates no residual contrast enhancement, measures 1.2 cm, previously 1.9 x 1.6 cm. Image normal gallbladder. Unchanged dilation of the common bile duct measuring up to 9 mm. Pancreas: No mass, inflammatory changes, or other parenchymal abnormality identified. Spleen:  Within normal limits in size and  appearance. Adrenals/Urinary Tract: Heterogeneous solid lesion of the upper pole of the right kidney measuring 2.8 x 1.7 cm on series 20, image 55, previously measured 1.7 x 1.4 cm. No evidence of hydronephrosis. Stomach/Bowel: Visualized portions demonstrate no evidence of obstruction or wall thickening.Peritoneal implant of the right lower quadrant measuring 3.2 x 2.4 cm seen just inferior to area of right hemicolectomy. Vascular/Lymphatic: New enlarged periportal and retroperitoneal lymph nodes. Reference periportal lymph node measuring 1.5 cm in short axis on series 15, image 41, previously measured 5 mm. New enlarged pericaval lymph node measuring 1.8 cm in short axis on series 15, image 49. Other: Soft tissue nodule of the right lower quadrant measuring 3.2 x 2.4 cm seen adjacent to several small bowel loops. Musculoskeletal: No suspicious bone lesions identified. IMPRESSION: Treated lesions of the hepatic dome and segment VI demonstrate no residual contrast enhancement. New moderate left pleural effusion with multiple new large pleural nodules, concerning for pleural metastatic disease. New enlarged periportal and retroperitoneal lymph nodes, findings are concerning for progressive metastatic disease. Increased size of heterogeneous solid lesion of the upper pole of the right kidney, concerning for additional site of metastatic disease. Peritoneal implant of the right lower quadrant seen just inferior to area of right hemicolectomy is increased in size when compared with prior CT. Comparison with prior MRI is limited due to differences in scan field-of-view, but possibly slightly increased in size. Electronically Signed   By: Yetta Glassman M.D.   On: 07/20/2021 14:47   IR Radiologist Eval & Mgmt  Result Date: 07/22/2021 Please refer to notes tab for details about interventional procedure. (Op Note)  ASSESSMENT & PLAN:   Cancer of right colon ([place]) #Right-sided colon adenocarcinoma-with  synchronous metastasis to liver/unresectable.   s/p Ablation- MAY 2022 MRI- Multiple lvier lesions/progression-patient underwent s/p Y 90 on 06/17; ON 10/02- mri-MRI liver- Treated lesions of the hepatic dome and segment VI demonstrate no residual contrast enhancement. New moderate left pleural effusion with multiple new large pleural nodules, concerning for pleural metastatic disease. New enlarged periportal and retroperitoneal lymph nodes, findings are concerning for progressive metastatic disease. Increased size of heterogeneous solid lesion of the upper pole of the right kidney, concerning for additional site of metastatic Disease. Peritoneal implant of the right lower quadrant seen just inferior to area of right hemicolectomy is increased in size when compared with prior CT. Comparison with prior MRI is limited due to differences in scan field-of-view, but possibly slightly increased in size.     #  Currently on oral TAS-102 (*35 mg/m2 twice daily on days 1-5 and 8-12 every 28 days].    # proceed with cycle #4 day-1 - Zirabev. Labs today reviewed;  acceptable for treatment today; except UA- 100; Currently on dose reduced to 4 [14x4=60] pills BID- M-F protein. S/p Lonsurf [ [place]- 0.4platelets- 149; Hb 10.8].   # UA protein: 100- check Urine protein ratio today; Protein Creatinine Ratio: 0.05- STABLE.   # Hypoalbuminemia/malnutrition: recommend Ensure/proetin intake- STABLE.   # PN- G-1-2; sec to oxaliplatin-   # Mediport- functioning-STABLE>  # Shoulder pain-refill oxycodone prn Silver.[place] ]- STABLE>  # DISPOSITION:   # zirabev today.  # follow up in 2 weeks-  MD; labs- cbc/cmp CEA/UA; zirabev-Dr.B     All questions were answered. The patient knows to call the clinic with any problems, questions or concerns.    Cammie Sickle, MD 08/04/2021 12:55 PM

## 2021-08-04 NOTE — Assessment & Plan Note (Addendum)
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   s/p Ablation- MAY 2022 MRI- Multiple lvier lesions/progression-patient underwent s/p Y 90 on 06/17; ON 10/02- mri-MRI liver- Treated lesions of the hepatic dome and segment VI demonstrate no residual contrast enhancement. New moderate left pleural effusion with multiple new large pleural nodules, concerning for pleural metastatic disease. New enlarged periportal and retroperitoneal lymph nodes, findings are concerning for progressive metastatic disease. Increased size of heterogeneous solid lesion of the upper pole of the right kidney, concerning for additional site of metastatic Disease. Peritoneal implant of the right lower quadrant seen just inferior to area of right hemicolectomy is increased in size when compared with prior CT. Comparison with prior MRI is limited due to differences in scan field-of-view, but possibly slightly increased in size.   #  Currently on oral TAS-102 (*35 mg/m2 twice daily on days 1-5 and 8-12 every 28 days].    # proceed with cycle #4 day-1 - Zirabev. Labs today reviewed;  acceptable for treatment today; except UA- 100; Currently on dose reduced to 4 [14x4=60] pills BID- M-F protein. S/p Lonsurf [ [place]- 0.4platelets- 149; Hb 10.8].   # UA protein: 100- check Urine protein ratio today; Protein Creatinine Ratio: 0.05- STABLE.   # Hypoalbuminemia/malnutrition: recommend Ensure/proetin intake- STABLE.   # PN- G-1-2; sec to oxaliplatin-   # Mediport- functioning-STABLE>  # Shoulder pain-refill oxycodone prn Silver.[place] ]- STABLE>  # DISPOSITION:  # zirabev today.  # follow up in 2 weeks-  MD; labs- cbc/cmp CEA/UA; zirabev-Dr.B

## 2021-08-05 LAB — CEA: CEA: 108 ng/mL — ABNORMAL HIGH (ref 0.0–4.7)

## 2021-08-11 ENCOUNTER — Encounter: Payer: Self-pay | Admitting: Internal Medicine

## 2021-08-18 ENCOUNTER — Inpatient Hospital Stay: Payer: Medicare HMO

## 2021-08-18 ENCOUNTER — Encounter: Payer: Self-pay | Admitting: Internal Medicine

## 2021-08-18 ENCOUNTER — Inpatient Hospital Stay: Payer: Medicare HMO | Admitting: Internal Medicine

## 2021-08-18 ENCOUNTER — Other Ambulatory Visit: Payer: Self-pay

## 2021-08-18 DIAGNOSIS — F17210 Nicotine dependence, cigarettes, uncomplicated: Secondary | ICD-10-CM | POA: Diagnosis not present

## 2021-08-18 DIAGNOSIS — Z5112 Encounter for antineoplastic immunotherapy: Secondary | ICD-10-CM | POA: Diagnosis not present

## 2021-08-18 DIAGNOSIS — C182 Malignant neoplasm of ascending colon: Secondary | ICD-10-CM

## 2021-08-18 DIAGNOSIS — Z7189 Other specified counseling: Secondary | ICD-10-CM

## 2021-08-18 DIAGNOSIS — C787 Secondary malignant neoplasm of liver and intrahepatic bile duct: Secondary | ICD-10-CM | POA: Diagnosis not present

## 2021-08-18 LAB — URINALYSIS, COMPLETE (UACMP) WITH MICROSCOPIC
Bacteria, UA: NONE SEEN
Glucose, UA: NEGATIVE mg/dL
Ketones, ur: 5 mg/dL — AB
Leukocytes,Ua: NEGATIVE
Nitrite: NEGATIVE
Protein, ur: 30 mg/dL — AB
Specific Gravity, Urine: 1.027 (ref 1.005–1.030)
pH: 5 (ref 5.0–8.0)

## 2021-08-18 LAB — CBC WITH DIFFERENTIAL/PLATELET
Abs Immature Granulocytes: 0.01 10*3/uL (ref 0.00–0.07)
Basophils Absolute: 0 10*3/uL (ref 0.0–0.1)
Basophils Relative: 1 %
Eosinophils Absolute: 0.2 10*3/uL (ref 0.0–0.5)
Eosinophils Relative: 5 %
HCT: 33.5 % — ABNORMAL LOW (ref 39.0–52.0)
Hemoglobin: 11.3 g/dL — ABNORMAL LOW (ref 13.0–17.0)
Immature Granulocytes: 0 %
Lymphocytes Relative: 20 %
Lymphs Abs: 0.7 10*3/uL (ref 0.7–4.0)
MCH: 35.8 pg — ABNORMAL HIGH (ref 26.0–34.0)
MCHC: 33.7 g/dL (ref 30.0–36.0)
MCV: 106 fL — ABNORMAL HIGH (ref 80.0–100.0)
Monocytes Absolute: 0.4 10*3/uL (ref 0.1–1.0)
Monocytes Relative: 11 %
Neutro Abs: 2.1 10*3/uL (ref 1.7–7.7)
Neutrophils Relative %: 63 %
Platelets: 115 10*3/uL — ABNORMAL LOW (ref 150–400)
RBC: 3.16 MIL/uL — ABNORMAL LOW (ref 4.22–5.81)
RDW: 16.5 % — ABNORMAL HIGH (ref 11.5–15.5)
WBC: 3.4 10*3/uL — ABNORMAL LOW (ref 4.0–10.5)
nRBC: 0 % (ref 0.0–0.2)

## 2021-08-18 LAB — COMPREHENSIVE METABOLIC PANEL
ALT: 21 U/L (ref 0–44)
AST: 41 U/L (ref 15–41)
Albumin: 2.9 g/dL — ABNORMAL LOW (ref 3.5–5.0)
Alkaline Phosphatase: 167 U/L — ABNORMAL HIGH (ref 38–126)
Anion gap: 7 (ref 5–15)
BUN: 12 mg/dL (ref 8–23)
CO2: 25 mmol/L (ref 22–32)
Calcium: 8.1 mg/dL — ABNORMAL LOW (ref 8.9–10.3)
Chloride: 103 mmol/L (ref 98–111)
Creatinine, Ser: 0.88 mg/dL (ref 0.61–1.24)
GFR, Estimated: >60 mL/min (ref >60)
Glucose, Bld: 217 mg/dL — ABNORMAL HIGH (ref 70–99)
Potassium: 3.8 mmol/L (ref 3.5–5.1)
Sodium: 135 mmol/L (ref 135–145)
Total Bilirubin: 0.7 mg/dL (ref 0.3–1.2)
Total Protein: 7.4 g/dL (ref 6.5–8.1)

## 2021-08-18 MED ORDER — HEPARIN SOD (PORK) LOCK FLUSH 100 UNIT/ML IV SOLN
500.0000 [IU] | Freq: Once | INTRAVENOUS | Status: DC | PRN
  Filled 2021-08-18: qty 5

## 2021-08-18 MED ORDER — SODIUM CHLORIDE 0.9 % IV SOLN
Freq: Once | INTRAVENOUS | Status: AC
Start: 2021-08-18 — End: 2021-08-18
  Filled 2021-08-18: qty 250

## 2021-08-18 MED ORDER — SODIUM CHLORIDE 0.9 % IV SOLN
5.0000 mg/kg | Freq: Once | INTRAVENOUS | Status: AC
  Administered 2021-08-18: 400 mg via INTRAVENOUS
  Filled 2021-08-18: qty 16

## 2021-08-18 NOTE — Assessment & Plan Note (Addendum)
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   s/p Ablation- MAY 2022 MRI- Multiple lvier lesions/progression-patient underwent s/p Y 90 on 06/17; ON 10/02- mri-MRI liver- Treated lesions of the hepatic dome and segment VI demonstrate no residual contrast enhancement. New moderate left pleural effusion with multiple new large pleural nodules, concerning for pleural metastatic disease. New enlarged periportal and retroperitoneal lymph nodes, findings are concerning for progressive metastatic disease. Increased size of heterogeneous solid lesion of the upper pole of the right kidney, concerning for additional site of metastatic Disease. Peritoneal implant of the right lower quadrant seen just inferior to area of right hemicolectomy is increased in size when compared with prior CT. Comparison with prior MRI is limited due to differences in scan field-of-view, but possibly slightly increased in size.  #  Currently on oral TAS-102 (*35 mg/m2 twice daily on days 1-5 and 8-12 every 28 days].    # proceed with cycle #4 day-15 - Zirabev. Labs today reviewed;  acceptable for treatment today; except UA- 100; Currently on dose reduced to 4 [14x4=60] pills BID- M-F . Proceed with Lonsurf today [delayed by 2 weeks sec to neutropenia]. Will plan imaging again in end of Nov, 2022.   # UA protein: 100- check Urine protein ratio today; Protein Creatinine Ratio: 0.05- STABLE.   # Hypoalbuminemia/malnutrition: recommend Ensure/proetin intake- STABLE.   # PN- G-1-2; sec to oxaliplatin-   # Mediport- functioning-STABLE>  # Shoulder pain-refill oxycodone prn Silver.[place] ]- STABLE>  # DISPOSITION:  # zirabev today.  # follow up in 2 weeks-  MD; labs- cbc/cmp CEA/UA; zirabev-Dr.B

## 2021-08-18 NOTE — Progress Notes (Signed)
[facility] NOTE  Patient Care Team: Oscar Hartigan, MD as PCP - General (Family Medicine) Oscar Sickle, MD as Consulting Physician (Hematology and Oncology)  CHIEF COMPLAINTS/PURPOSE OF CONSULTATION: Colon cancer  #  Oncology History Overview Note  # MAY 2020- 3. 03/09/19 Liver biopsy. Microscopic examination shows malignant cells with glandular architecture consistent with adenocarcinoma. The malignant cells are positive for CK20 and CDX-2. These findings support the clinical impression of metastatic colon adenocarcinoma. 4. 03/10/19 R hemicolectomy. Tumor site cecum. Adenocarcinoma. Mucinous features present. G2. No tumor deposits. Invades visceral peritoneum. No tumor perforation. LVI present. PNI not identified. All margins uninvolved. 1/12 LNs. PT4apN1. Periappendiceal inflammation c/w resolving abscess. Microsatellite stable (MSS). [Dr.Mettu; [facility]]  # SEP 4th 2020 [compared to May 2020]  Interval increase in size of the metastases to the hepatic dome, The metastasis to the left hepatic lobe is unchanged; 2.  New subcentimeter hypoattenuating lesion in the inferior right hepatic lobe, incompletely characterized on CT. 3.  Postsurgical changes following right hemicolectomy.  # OCT 2020- FOLFOX +avastin; CT dec 22nd 2020- [compared to [facility] sep 9th 2020]-Liver- slight progression versus stable disease; CT scan SEP 4th 2021- Progressive disease-left lower lobe lung nodule 8 mm [previously 4 mm]; increase in size of the hepatic metastases by few millimeters.;  Soft tissue nodule adjacent to anastomotic site again increased by few millimeters.STOP FOLFOX; cont avastin  #  SEP 20th, 2021- FOLFIRI+ AVASTIN; HOLD FEB 2022- sec to poor tolerance [peptic ulcer]; 3/30-liver ablation-   [bland embolization of segment 2/3 liver lesion on 12/19/2020;  microwave ablation of both liver lesions.  # July 25th, 2022-   # FEB /16-EGD [Dr.Anna-duodenal ulcer-? meloxicam]  #  JAN 2022- COVID [s/p Mab infusion; pills; skin rash resolved.]  # NGS/F-ONE-MUTATED K-RAS [G]  # PALLIATIVE CARE EVALUATION: 09/20/2019-Oscar [name]  # PAIN MANAGEMENT: NA   DIAGNOSIS: COLON CANCER  STAGE:  IV     ;  GOALS:Palliative  CURRENT/MOST RECENT THERAPY : FOLFIRI+ avastin [C]    Cancer of right colon ([place])  07/05/2019 Initial Diagnosis   Cancer of right colon ([place])   07/24/2019 - 06/25/2020 Chemotherapy   The patient had dexamethasone (DECADRON) 4 MG tablet, 8 mg, Oral, Daily, 1 of 1 cycle, Start date: --, End date: -- palonosetron (ALOXI) injection 0.25 mg, 0.25 mg, Intravenous,  Once, 23 of 25 cycles Administration: 0.25 mg (07/24/2019), 0.25 mg (08/07/2019), 0.25 mg (08/21/2019), 0.25 mg (09/04/2019), 0.25 mg (09/20/2019), 0.25 mg (10/04/2019), 0.25 mg (10/16/2019), 0.25 mg (10/30/2019), 0.25 mg (11/22/2019), 0.25 mg (12/06/2019), 0.25 mg (12/20/2019), 0.25 mg (01/03/2020), 0.25 mg (01/29/2020), 0.25 mg (02/12/2020), 0.25 mg (02/26/2020), 0.25 mg (03/11/2020), 0.25 mg (03/25/2020), 0.25 mg (04/08/2020), 0.25 mg (04/24/2020), 0.25 mg (05/08/2020), 0.25 mg (05/22/2020), 0.25 mg (06/10/2020), 0.25 mg (06/25/2020) leucovorin 800 mg in dextrose 5 % 250 mL infusion, 844 mg, Intravenous,  Once, 23 of 25 cycles Administration: 800 mg (07/24/2019), 800 mg (08/07/2019), 800 mg (08/21/2019), 800 mg (09/04/2019), 800 mg (09/20/2019), 800 mg (10/04/2019), 800 mg (10/16/2019), 800 mg (10/30/2019), 800 mg (11/22/2019), 800 mg (12/06/2019), 800 mg (12/20/2019), 800 mg (01/03/2020), 800 mg (01/29/2020), 800 mg (02/12/2020), 800 mg (02/26/2020), 800 mg (03/11/2020), 800 mg (03/25/2020), 800 mg (04/08/2020), 800 mg (04/24/2020), 800 mg (05/08/2020), 800 mg (05/22/2020), 800 mg (06/10/2020), 800 mg (06/25/2020) oxaliplatin (ELOXATIN) 180 mg in dextrose 5 % 500 mL chemo infusion, 85 mg/m2 = 180 mg, Intravenous,  Once, 23 of 25 cycles Dose modification: 178 mg (original dose 85 mg/m2, Cycle 17, Reason:  Other (see comments), Comment: insurance adjusted dose  ) Administration: 180 mg (07/24/2019), 180 mg (08/07/2019), 180 mg (08/21/2019), 180 mg (09/04/2019), 180 mg (09/20/2019), 180 mg (10/04/2019), 180 mg (10/16/2019), 180 mg (10/30/2019), 180 mg (11/22/2019), 180 mg (12/06/2019), 180 mg (12/20/2019), 180 mg (01/03/2020), 180 mg (01/29/2020), 180 mg (02/12/2020), 180 mg (02/26/2020), 180 mg (03/11/2020), 180 mg (04/08/2020), 180 mg (04/24/2020), 180 mg (05/08/2020), 180 mg (05/22/2020), 180 mg (06/10/2020), 180 mg (06/25/2020) fluorouracil (ADRUCIL) 5,000 mg in sodium chloride 0.9 % 150 mL chemo infusion, 5,050 mg, Intravenous, 1 Day/Dose, 23 of 25 cycles Administration: 5,000 mg (07/24/2019), 5,000 mg (08/07/2019), 5,000 mg (08/21/2019), 5,050 mg (09/04/2019), 5,000 mg (10/04/2019), 5,000 mg (10/16/2019), 5,000 mg (11/22/2019), 5,000 mg (12/06/2019), 5,000 mg (12/20/2019), 5,000 mg (01/03/2020), 5,000 mg (01/29/2020), 5,000 mg (02/12/2020), 5,000 mg (02/26/2020), 5,000 mg (03/11/2020), 5,000 mg (03/25/2020), 5,000 mg (04/08/2020), 5,000 mg (04/24/2020), 5,000 mg (05/08/2020), 5,000 mg (05/22/2020), 5,000 mg (06/10/2020), 5,000 mg (06/25/2020) bevacizumab-bvzr (ZIRABEV) 400 mg in sodium chloride 0.9 % 100 mL chemo infusion, 5 mg/kg = 400 mg, Intravenous,  Once, 23 of 25 cycles Administration: 400 mg (08/07/2019), 400 mg (08/21/2019), 400 mg (09/04/2019), 400 mg (09/20/2019), 400 mg (10/04/2019), 400 mg (10/16/2019), 400 mg (10/30/2019), 400 mg (11/22/2019), 400 mg (12/06/2019), 400 mg (12/20/2019), 400 mg (01/03/2020), 400 mg (01/29/2020), 400 mg (02/12/2020), 400 mg (02/26/2020), 400 mg (03/11/2020), 400 mg (03/25/2020), 400 mg (04/08/2020), 400 mg (04/24/2020), 400 mg (05/08/2020), 400 mg (05/22/2020), 400 mg (06/10/2020), 400 mg (06/25/2020)   for chemotherapy treatment.     07/08/2020 -  Chemotherapy   Patient is on Treatment Plan : COLORECTAL FOLFIRI / BEVACIZUMAB Q14D     11/05/2020 Cancer Staging   Staging form: Colon and Rectum, AJCC 8th Edition - Clinical: Stage IVC (pM1c) - Signed by Oscar Coder, MD on  11/05/2020      HISTORY OF PRESENTING ILLNESS: Alone.  Ambulating independently.  Oscar [name] 80 y.o.  male with metastatic colon cancer to the liver- -currently on bevacizumab plus Lonsurf is here for follow-up.  Patient currently on Lonsurf-currently s/p cycle #2; however cycle #3 was held because of neutropenia 2 weeks ago.  No fever no chills no diarrhea.  No nausea no vomiting.  No sores in the mouth.    Review of Systems  Constitutional:  Positive for malaise/fatigue. Negative for chills, diaphoresis and fever.  HENT:  Negative for nosebleeds and sore throat.   Eyes:  Negative for double vision.  Respiratory:  Negative for cough, hemoptysis, sputum production, shortness of breath and wheezing.   Cardiovascular:  Negative for chest pain, palpitations, orthopnea and leg swelling.  Gastrointestinal:  Negative for blood in stool, constipation, heartburn, melena, nausea and vomiting.  Genitourinary:  Negative for dysuria, frequency and urgency.  Musculoskeletal:  Positive for back pain and joint pain.  Skin: Negative.  Negative for itching and rash.  Neurological:  Positive for tingling. Negative for focal weakness, weakness and headaches.  Endo/Heme/Allergies:  Does not bruise/bleed easily.  Psychiatric/Behavioral:  Negative for depression. The patient is not nervous/anxious and does not have insomnia.     MEDICAL HISTORY:  Past Medical History:  Diagnosis Date   Arthritis    Cancer (HCC)    colon cancer 02/2019 per pt    Family history of adverse reaction to anesthesia    cousin took all night to wake up from anesthesia   H/O colon cancer, stage IV    Hyperlipemia    Neuromuscular disorder (HCC)    Pre-diabetes  SURGICAL HISTORY: Past Surgical History:  Procedure Laterality Date   COLON SURGERY     ESOPHAGOGASTRODUODENOSCOPY (EGD) WITH PROPOFOL N/A 12/26/2020   Procedure: ESOPHAGOGASTRODUODENOSCOPY (EGD) WITH PROPOFOL;  Surgeon: [name], MD;  [facility]  ENDOSCOPY;  Service: Gastroenterology;  Laterality: N/A;   IR ANGIOGRAM SELECTIVE EACH ADDITIONAL VESSEL  12/19/2020   IR ANGIOGRAM SELECTIVE EACH ADDITIONAL VESSEL  03/24/2021   IR ANGIOGRAM SELECTIVE EACH ADDITIONAL VESSEL  03/24/2021   IR ANGIOGRAM SELECTIVE EACH ADDITIONAL VESSEL  04/04/2021   IR ANGIOGRAM SELECTIVE EACH ADDITIONAL VESSEL  04/04/2021   IR ANGIOGRAM SELECTIVE EACH ADDITIONAL VESSEL  04/04/2021   IR ANGIOGRAM VISCERAL SELECTIVE  12/19/2020   IR ANGIOGRAM VISCERAL SELECTIVE  03/24/2021   IR ANGIOGRAM VISCERAL SELECTIVE  04/04/2021   IR ANGIOGRAM VISCERAL SELECTIVE  04/04/2021   IR EMBO ARTERIAL NOT HEMORR HEMANG INC GUIDE ROADMAPPING  03/24/2021   IR EMBO TUMOR ORGAN ISCHEMIA INFARCT INC GUIDE ROADMAPPING  12/19/2020   IR EMBO TUMOR ORGAN ISCHEMIA INFARCT INC GUIDE ROADMAPPING  04/04/2021   IR IMAGING GUIDED PORT INSERTION  07/20/2019   IR RADIOLOGIST EVAL & MGMT  12/05/2020   IR RADIOLOGIST EVAL & MGMT  02/11/2021   IR RADIOLOGIST EVAL & MGMT  03/04/2021   IR RADIOLOGIST EVAL & MGMT  04/29/2021   IR RADIOLOGIST EVAL & MGMT  07/22/2021   IR US GUIDE VASC ACCESS RIGHT  12/19/2020   IR US GUIDE VASC ACCESS RIGHT  03/24/2021   IR US GUIDE VASC ACCESS RIGHT  04/04/2021   JOINT REPLACEMENT Left 2010   Knee   RADIOLOGY WITH ANESTHESIA N/A 01/15/2021   Procedure: CT WITH ANESTHESIA MICROWAVE ABLATION OF LIVER;  Surgeon: Criselda Peaches, MD;  Location: [facility];  Service: Anesthesiology;  Laterality: N/A;    SOCIAL HISTORY: Social History   Socioeconomic History   Marital status: Married    Spouse name: Not on file   Number of children: Not on file   Years of education: Not on file   Highest education level: Not on file  Occupational History   Not on file  Tobacco Use   Smoking status: Every Day    Packs/day: 0.25    Years: 15.00    Pack years: 3.75    Types: Cigarettes   Smokeless tobacco: Never   Tobacco comments:    1 to 2 cigarettes a day occasionally  Vaping Use   Vaping Use: Never  used  Substance and Sexual Activity   Alcohol use: Yes    Comment: rarely    Drug use: No   Sexual activity: Not on file  Other Topics Concern   Not on file  Social History Narrative   Recruitment consultant retd; lives in [place]; smoking 3cig/day; [3/4 ppd x started at 9 years]; no alcohol. Son & daughter; wife dementia [waiting for placement].    Social Determinants of Health   Financial Resource Strain: Not on file  Food Insecurity: Not on file  Transportation Needs: Not on file  Physical Activity: Not on file  Stress: Not on file  Social Connections: Not on file  Intimate Partner Violence: Not on file    FAMILY HISTORY: Family History  Problem Relation Age of Onset   Peptic Ulcer Disease Father     ALLERGIES:  is allergic to ace inhibitors.  MEDICATIONS:  Current Outpatient Medications  Medication Sig Dispense Refill   amLODipine (NORVASC) 5 MG tablet Take 5 mg by mouth daily.     oxyCODONE (OXY IR/ROXICODONE) 5 MG  immediate release tablet Take 1 tablet (5 mg total) by mouth every 6 (six) hours as needed for severe pain. 60 tablet 0   sucralfate (CARAFATE) 1 g tablet Take 1 tablet (1 g total) by mouth 4 (four) times daily -  with meals and at bedtime. 90 tablet 3   tamsulosin (FLOMAX) 0.4 MG CAPS capsule Take 1 capsule (0.4 mg total) by mouth daily. 30 capsule 11   trifluridine-tipiracil (LONSURF) 15-6.14 MG tablet Take 5 tablets (75 mg of trifluridine total) by mouth 2 (two) times daily after a meal. 1 hr after AM & PM meals on days 1-5, 8-12. Repeat every 28day 100 tablet    ondansetron (ZOFRAN) 4 MG tablet Take 1 tablet (4 mg total) by mouth every 8 (eight) hours as needed for nausea or vomiting. (Patient not taking: No sig reported) 20 tablet 0   ondansetron (ZOFRAN) 8 MG tablet Take 1 tablet (8 mg total) by mouth every 8 (eight) hours as needed for nausea or vomiting. (Patient not taking: No sig reported) 20 tablet 0   pravastatin (PRAVACHOL) 40 MG tablet  Take 40 mg by mouth daily.  (Patient not taking: No sig reported)     prochlorperazine (COMPAZINE) 10 MG tablet Take 1 tablet (10 mg total) by mouth every 6 (six) hours as needed for nausea or vomiting. (Patient not taking: No sig reported) 60 tablet 0   No current facility-administered medications for this visit.   Facility-Administered Medications Ordered in Other Visits  Medication Dose Route Frequency Provider Last Rate Last Admin   bevacizumab-bvzr (ZIRABEV) 400 mg in sodium chloride 0.9 % 100 mL chemo infusion  5 mg/kg (Treatment Plan Recorded) Intravenous Once Charlaine Dalton R, MD       heparin lock flush 100 unit/mL  500 Units Intracatheter Once PRN Oscar Sickle, MD          .  PHYSICAL EXAMINATION: ECOG PERFORMANCE STATUS: 0 - Asymptomatic  Vitals:   08/18/21 0846  BP: 138/74  Pulse: 75  Resp: 20  Temp: 97.8 F (36.6 C)  SpO2: 100%   Filed Weights   08/18/21 0846  Weight: 186 lb 6.4 oz (84.6 kg)    Physical Exam HENT:     Head: Normocephalic and atraumatic.     Mouth/Throat:     Pharynx: No oropharyngeal exudate.  Eyes:     Pupils: Pupils are equal, round, and reactive to light.  Cardiovascular:     Rate and Rhythm: Normal rate and regular rhythm.  Pulmonary:     Effort: No respiratory distress.     Breath sounds: No wheezing.     Comments: Decreased breath sounds bilaterally.  No wheeze or crackles Abdominal:     General: Bowel sounds are normal. There is no distension.     Palpations: Abdomen is soft. There is no mass.     Tenderness: There is no abdominal tenderness. There is no guarding or rebound.  Musculoskeletal:        General: No tenderness. Normal range of motion.     Cervical back: Normal range of motion and neck supple.  Skin:    General: Skin is warm.  Neurological:     Mental Status: He is alert and oriented to person, place, and time.  Psychiatric:        Mood and Affect: Affect normal.   LABORATORY DATA:  I have  reviewed the data as listed Lab Results  Component Value Date   WBC 3.4 (L) 08/18/2021   HGB  11.3 (L) 08/18/2021   HCT 33.5 (L) 08/18/2021   MCV 106.0 (H) 08/18/2021   PLT 115 (L) 08/18/2021   Recent Labs    07/21/21 0832 08/04/21 0854 08/18/21 0831  NA 138 136 135  K 3.7 3.5 3.8  CL 109 106 103  CO2 $Re'25 24 25  'Sqd$ GLUCOSE 197* 190* 217*  BUN $Re'14 11 12  'HKl$ CREATININE 0.73 0.82 0.88  CALCIUM 8.1* 8.1* 8.1*  GFRNONAA >60 >60 >60  PROT 7.3 7.4 7.4  ALBUMIN 2.8* 3.0* 2.9*  AST 37 35 41  ALT $Re'18 18 21  'AQJ$ ALKPHOS 147* 161* 167*  BILITOT 1.1 1.0 0.7    RADIOGRAPHIC STUDIES: I have personally reviewed the radiological images as listed and agreed with the findings in the report. IR Radiologist Eval & Mgmt  Result Date: 07/22/2021 Please refer to notes tab for details about interventional procedure. (Op Note)    ASSESSMENT & PLAN:   Cancer of right colon ([place]) #Right-sided colon adenocarcinoma-with synchronous metastasis to liver/unresectable.   s/p Ablation- MAY 2022 MRI- Multiple lvier lesions/progression-patient underwent s/p Y 90 on 06/17; ON 10/02- mri-MRI liver- Treated lesions of the hepatic dome and segment VI demonstrate no residual contrast enhancement. New moderate left pleural effusion with multiple new large pleural nodules, concerning for pleural metastatic disease. New enlarged periportal and retroperitoneal lymph nodes, findings are concerning for progressive metastatic disease. Increased size of heterogeneous solid lesion of the upper pole of the right kidney, concerning for additional site of metastatic Disease. Peritoneal implant of the right lower quadrant seen just inferior to area of right hemicolectomy is increased in size when compared with prior CT. Comparison with prior MRI is limited due to differences in scan field-of-view, but possibly slightly increased in size.    #  Currently on oral TAS-102 (*35 mg/m2 twice daily on days 1-5 and 8-12 every 28 days].    #  proceed with cycle #4 day-15 - Zirabev. Labs today reviewed;  acceptable for treatment today; except UA- 100; Currently on dose reduced to 4 [14x4=60] pills BID- M-F . Proceed with Lonsurf today [delayed by 2 weeks sec to neutropenia]. Will plan imaging again in end of Nov, 2022.   # UA protein: 100- check Urine protein ratio today; Protein Creatinine Ratio: 0.05- STABLE.   # Hypoalbuminemia/malnutrition: recommend Ensure/proetin intake- STABLE.   # PN- G-1-2; sec to oxaliplatin-   # Mediport- functioning-STABLE>  # Shoulder pain-refill oxycodone prn [place] ]- STABLE>  # DISPOSITION:   # zirabev today.  # follow up in 2 weeks-  MD; labs- cbc/cmp CEA/UA; zirabev-Dr.B     All questions were answered. The patient knows to call the clinic with any problems, questions or concerns.    Oscar Sickle, MD 08/18/2021 9:34 AM

## 2021-08-18 NOTE — Patient Instructions (Addendum)
Bevacizumab injection What is this medication? BEVACIZUMAB (be va SIZ yoo mab) is a monoclonal antibody. It is used to treat many types of cancer. This medicine may be used for other purposes; ask your health care provider or pharmacist if you have questions. COMMON BRAND NAME(S): Avastin, MVASI, Noah Charon What should I tell my care team before I take this medication? They need to know if you have any of these conditions: diabetes heart disease high blood pressure history of coughing up blood prior anthracycline chemotherapy (e.g., doxorubicin, daunorubicin, epirubicin) recent or ongoing radiation therapy recent or planning to have surgery stroke an unusual or allergic reaction to bevacizumab, hamster proteins, mouse proteins, other medicines, foods, dyes, or preservatives pregnant or trying to get pregnant breast-feeding How should I use this medication? This medicine is for infusion into a vein. It is given by a health care professional in a hospital or clinic setting. Talk to your pediatrician regarding the use of this medicine in children. Special care may be needed. Overdosage: If you think you have taken too much of this medicine contact a poison control center or emergency room at once. NOTE: This medicine is only for you. Do not share this medicine with others. What if I miss a dose? It is important not to miss your dose. Call your doctor or health care professional if you are unable to keep an appointment. What may interact with this medication? Interactions are not expected. This list may not describe all possible interactions. Give your health care provider a list of all the medicines, herbs, non-prescription drugs, or dietary supplements you use. Also tell them if you smoke, drink alcohol, or use illegal drugs. Some items may interact with your medicine. What should I watch for while using this medication? Your condition will be monitored carefully while you are receiving this  medicine. You will need important blood work and urine testing done while you are taking this medicine. This medicine may increase your risk to bruise or bleed. Call your doctor or health care professional if you notice any unusual bleeding. Before having surgery, talk to your health care provider to make sure it is ok. This drug can increase the risk of poor healing of your surgical site or wound. You will need to stop this drug for 28 days before surgery. After surgery, wait at least 28 days before restarting this drug. Make sure the surgical site or wound is healed enough before restarting this drug. Talk to your health care provider if questions. Do not become pregnant while taking this medicine or for 6 months after stopping it. Women should inform their doctor if they wish to become pregnant or think they might be pregnant. There is a potential for serious side effects to an unborn child. Talk to your health care professional or pharmacist for more information. Do not breast-feed an infant while taking this medicine and for 6 months after the last dose. This medicine has caused ovarian failure in some women. This medicine may interfere with the ability to have a child. You should talk to your doctor or health care professional if you are concerned about your fertility. What side effects may I notice from receiving this medication? Side effects that you should report to your doctor or health care professional as soon as possible: allergic reactions like skin rash, itching or hives, swelling of the face, lips, or tongue chest pain or chest tightness chills coughing up blood high fever seizures severe constipation signs and symptoms of bleeding such  as bloody or black, tarry stools; red or dark-brown urine; spitting up blood or brown material that looks like coffee grounds; red spots on the skin; unusual bruising or bleeding from the eye, gums, or nose signs and symptoms of a blood clot such as  breathing problems; chest pain; severe, sudden headache; pain, swelling, warmth in the leg signs and symptoms of a stroke like changes in vision; confusion; trouble speaking or understanding; severe headaches; sudden numbness or weakness of the face, arm or leg; trouble walking; dizziness; loss of balance or coordination stomach pain sweating swelling of legs or ankles vomiting weight gain Side effects that usually do not require medical attention (report to your doctor or health care professional if they continue or are bothersome): back pain changes in taste decreased appetite dry skin nausea tiredness This list may not describe all possible side effects. Call your doctor for medical advice about side effects. You may report side effects to FDA at 1-800-FDA-1088. Where should I keep my medication? This drug is given in a hospital or clinic and will not be stored at home. NOTE: This sheet is a summary. It may not cover all possible information. If you have questions about this medicine, talk to your doctor, pharmacist, or health care provider.  2022 Elsevier/Gold Standard (2019-08-02 10:50:46) [place]  Discharge Instructions: Thank you for choosing [facility] to provide your oncology and hematology care.  If you have a lab appointment with the [facility], please go directly to the [facility] and check in at the registration area.  Wear comfortable clothing and clothing appropriate for easy access to any Portacath or PICC line.   We strive to give you quality time with your provider. You may need to reschedule your appointment if you arrive late (15 or more minutes).  Arriving late affects you and other patients whose appointments are after yours.  Also, if you miss three or more appointments without notifying the office, you may be dismissed from the clinic at the provider's discretion.      For prescription refill requests,  have your pharmacy contact our office and allow 72 hours for refills to be completed.    Today you received the following chemotherapy and/or immunotherapy agents: Noah Charon       To help prevent nausea and vomiting after your treatment, we encourage you to take your nausea medication as directed.  BELOW ARE SYMPTOMS THAT SHOULD BE REPORTED IMMEDIATELY: *FEVER GREATER THAN 100.4 F (38 C) OR HIGHER *CHILLS OR SWEATING *NAUSEA AND VOMITING THAT IS NOT CONTROLLED WITH YOUR NAUSEA MEDICATION *UNUSUAL SHORTNESS OF BREATH *UNUSUAL BRUISING OR BLEEDING *URINARY PROBLEMS (pain or burning when urinating, or frequent urination) *BOWEL PROBLEMS (unusual diarrhea, constipation, pain near the anus) TENDERNESS IN MOUTH AND THROAT WITH OR WITHOUT PRESENCE OF ULCERS (sore throat, sores in mouth, or a toothache) UNUSUAL RASH, SWELLING OR PAIN  UNUSUAL VAGINAL DISCHARGE OR ITCHING   Items with * indicate a potential emergency and should be followed up as soon as possible or go to the Emergency Department if any problems should occur.  Please show the CHEMOTHERAPY ALERT CARD or IMMUNOTHERAPY ALERT CARD at check-in to the Emergency Department and triage nurse.  Should you have questions after your visit or need to cancel or reschedule your appointment, please contact [facility]  [phone] and follow the prompts.  Office hours are 8:00 a.m. to 4:30 p.m. Monday - Friday. Please note that voicemails left  after 4:00 p.m. may not be returned until the following business day.  We are closed weekends and major holidays. You have access to a nurse at all times for urgent questions. Please call the main number to the clinic [phone] and follow the prompts.  For any non-urgent questions, you may also contact your provider using MyChart. We now offer e-Visits for anyone 72 and older to request care online for non-urgent symptoms. For details visit [email or web address].    Also download the MyChart app! Go to the app store, search "MyChart", open the app, select [facility], and log in with your MyChart username and password.  Due to Covid, a mask is required upon entering the hospital/clinic. If you do not have a mask, one will be given to you upon arrival. For doctor visits, patients may have 1 support person aged 68 or older with them. For treatment visits, patients cannot have anyone with them due to current Covid guidelines and our immunocompromised population.

## 2021-08-19 LAB — CEA: CEA: 108 ng/mL — ABNORMAL HIGH (ref 0.0–4.7)

## 2021-08-29 ENCOUNTER — Other Ambulatory Visit: Payer: Self-pay

## 2021-08-29 ENCOUNTER — Inpatient Hospital Stay: Payer: Medicare HMO | Attending: Hospice and Palliative Medicine | Admitting: Hospice and Palliative Medicine

## 2021-08-29 DIAGNOSIS — Z515 Encounter for palliative care: Secondary | ICD-10-CM

## 2021-08-29 DIAGNOSIS — F17210 Nicotine dependence, cigarettes, uncomplicated: Secondary | ICD-10-CM | POA: Insufficient documentation

## 2021-08-29 DIAGNOSIS — Z5112 Encounter for antineoplastic immunotherapy: Secondary | ICD-10-CM | POA: Insufficient documentation

## 2021-08-29 DIAGNOSIS — C787 Secondary malignant neoplasm of liver and intrahepatic bile duct: Secondary | ICD-10-CM | POA: Insufficient documentation

## 2021-08-29 DIAGNOSIS — C182 Malignant neoplasm of ascending colon: Secondary | ICD-10-CM | POA: Insufficient documentation

## 2021-08-29 NOTE — Progress Notes (Signed)
I did not reach patient for scheduled virtual visit.  Voicemail left.  Will reschedule.

## 2021-09-01 ENCOUNTER — Inpatient Hospital Stay (HOSPITAL_BASED_OUTPATIENT_CLINIC_OR_DEPARTMENT_OTHER): Payer: Medicare HMO | Admitting: Internal Medicine

## 2021-09-01 ENCOUNTER — Inpatient Hospital Stay: Payer: Medicare HMO

## 2021-09-01 ENCOUNTER — Encounter: Payer: Self-pay | Admitting: Internal Medicine

## 2021-09-01 ENCOUNTER — Other Ambulatory Visit: Payer: Self-pay

## 2021-09-01 ENCOUNTER — Other Ambulatory Visit (HOSPITAL_COMMUNITY): Payer: Self-pay

## 2021-09-01 DIAGNOSIS — C182 Malignant neoplasm of ascending colon: Secondary | ICD-10-CM

## 2021-09-01 DIAGNOSIS — C787 Secondary malignant neoplasm of liver and intrahepatic bile duct: Secondary | ICD-10-CM | POA: Diagnosis not present

## 2021-09-01 DIAGNOSIS — Z7189 Other specified counseling: Secondary | ICD-10-CM

## 2021-09-01 DIAGNOSIS — Z5112 Encounter for antineoplastic immunotherapy: Secondary | ICD-10-CM | POA: Diagnosis not present

## 2021-09-01 DIAGNOSIS — F17210 Nicotine dependence, cigarettes, uncomplicated: Secondary | ICD-10-CM | POA: Diagnosis not present

## 2021-09-01 LAB — CBC WITH DIFFERENTIAL/PLATELET
Abs Immature Granulocytes: 0 10*3/uL (ref 0.00–0.07)
Basophils Absolute: 0 10*3/uL (ref 0.0–0.1)
Basophils Relative: 1 %
Eosinophils Absolute: 0.2 10*3/uL (ref 0.0–0.5)
Eosinophils Relative: 9 %
HCT: 30.9 % — ABNORMAL LOW (ref 39.0–52.0)
Hemoglobin: 10.6 g/dL — ABNORMAL LOW (ref 13.0–17.0)
Immature Granulocytes: 0 %
Lymphocytes Relative: 29 %
Lymphs Abs: 0.6 10*3/uL — ABNORMAL LOW (ref 0.7–4.0)
MCH: 35.9 pg — ABNORMAL HIGH (ref 26.0–34.0)
MCHC: 34.3 g/dL (ref 30.0–36.0)
MCV: 104.7 fL — ABNORMAL HIGH (ref 80.0–100.0)
Monocytes Absolute: 0.1 10*3/uL (ref 0.1–1.0)
Monocytes Relative: 5 %
Neutro Abs: 1.1 10*3/uL — ABNORMAL LOW (ref 1.7–7.7)
Neutrophils Relative %: 56 %
Platelets: 72 10*3/uL — ABNORMAL LOW (ref 150–400)
RBC: 2.95 MIL/uL — ABNORMAL LOW (ref 4.22–5.81)
RDW: 14.8 % (ref 11.5–15.5)
WBC: 2 10*3/uL — ABNORMAL LOW (ref 4.0–10.5)
nRBC: 0 % (ref 0.0–0.2)

## 2021-09-01 LAB — URINALYSIS, COMPLETE (UACMP) WITH MICROSCOPIC
Bilirubin Urine: NEGATIVE
Glucose, UA: NEGATIVE mg/dL
Ketones, ur: 5 mg/dL — AB
Leukocytes,Ua: NEGATIVE
Nitrite: NEGATIVE
Protein, ur: 30 mg/dL — AB
Specific Gravity, Urine: 1.029 (ref 1.005–1.030)
pH: 5 (ref 5.0–8.0)

## 2021-09-01 LAB — COMPREHENSIVE METABOLIC PANEL
ALT: 17 U/L (ref 0–44)
AST: 37 U/L (ref 15–41)
Albumin: 3 g/dL — ABNORMAL LOW (ref 3.5–5.0)
Alkaline Phosphatase: 149 U/L — ABNORMAL HIGH (ref 38–126)
Anion gap: 6 (ref 5–15)
BUN: 14 mg/dL (ref 8–23)
CO2: 26 mmol/L (ref 22–32)
Calcium: 8.2 mg/dL — ABNORMAL LOW (ref 8.9–10.3)
Chloride: 104 mmol/L (ref 98–111)
Creatinine, Ser: 0.81 mg/dL (ref 0.61–1.24)
GFR, Estimated: >60 mL/min (ref >60)
Glucose, Bld: 204 mg/dL — ABNORMAL HIGH (ref 70–99)
Potassium: 3.7 mmol/L (ref 3.5–5.1)
Sodium: 136 mmol/L (ref 135–145)
Total Bilirubin: 0.8 mg/dL (ref 0.3–1.2)
Total Protein: 7.6 g/dL (ref 6.5–8.1)

## 2021-09-01 MED ORDER — SODIUM CHLORIDE 0.9% FLUSH
10.0000 mL | Freq: Once | INTRAVENOUS | Status: AC
  Administered 2021-09-01: 10 mL via INTRAVENOUS
  Filled 2021-09-01: qty 10

## 2021-09-01 MED ORDER — SODIUM CHLORIDE 0.9 % IV SOLN
5.0000 mg/kg | Freq: Once | INTRAVENOUS | Status: AC
  Administered 2021-09-01: 400 mg via INTRAVENOUS
  Filled 2021-09-01: qty 16

## 2021-09-01 MED ORDER — SODIUM CHLORIDE 0.9 % IV SOLN
Freq: Once | INTRAVENOUS | Status: AC
  Filled 2021-09-01: qty 250

## 2021-09-01 MED ORDER — OXYCODONE HCL 5 MG PO TABS: 5.0000 mg | ORAL_TABLET | Freq: Two times a day (BID) | ORAL | 0 refills | Status: DC | PRN

## 2021-09-01 MED ORDER — HEPARIN SOD (PORK) LOCK FLUSH 100 UNIT/ML IV SOLN
500.0000 [IU] | Freq: Once | INTRAVENOUS | Status: AC | PRN
  Administered 2021-09-01: 500 [IU]
  Filled 2021-09-01: qty 5

## 2021-09-01 NOTE — Progress Notes (Signed)
[facility] NOTE  Patient Care Team: Sofie Hartigan, MD as PCP - General (Family Medicine) Cammie Sickle, MD as Consulting Physician (Hematology and Oncology)  CHIEF COMPLAINTS/PURPOSE OF CONSULTATION: Colon cancer  #  Oncology History Overview Note  # MAY 2020- 3. 03/09/19 Liver biopsy. Microscopic examination shows malignant cells with glandular architecture consistent with adenocarcinoma. The malignant cells are positive for CK20 and CDX-2. These findings support the clinical impression of metastatic colon adenocarcinoma. 4. 03/10/19 R hemicolectomy. Tumor site cecum. Adenocarcinoma. Mucinous features present. G2. No tumor deposits. Invades visceral peritoneum. No tumor perforation. LVI present. PNI not identified. All margins uninvolved. 1/12 LNs. PT4apN1. Periappendiceal inflammation c/w resolving abscess. Microsatellite stable (MSS). [Dr.Mettu; [facility]]  # SEP 4th 2020 [compared to May 2020]  Interval increase in size of the metastases to the hepatic dome, The metastasis to the left hepatic lobe is unchanged; 2.  New subcentimeter hypoattenuating lesion in the inferior right hepatic lobe, incompletely characterized on CT. 3.  Postsurgical changes following right hemicolectomy.  # OCT 2020- FOLFOX +avastin; CT dec 22nd 2020- [compared to [facility] sep 9th 2020]-Liver- slight progression versus stable disease; CT scan SEP 4th 2021- Progressive disease-left lower lobe lung nodule 8 mm [previously 4 mm]; increase in size of the hepatic metastases by few millimeters.;  Soft tissue nodule adjacent to anastomotic site again increased by few millimeters.STOP FOLFOX; cont avastin  #  SEP 20th, 2021- FOLFIRI+ AVASTIN; HOLD FEB 2022- sec to poor tolerance [peptic ulcer]; 3/30-liver ablation-   [bland embolization of segment 2/3 liver lesion on 12/19/2020;  microwave ablation of both liver lesions.  # July 25th, 2022-   # FEB /16-EGD [Dr.Anna-duodenal ulcer-? meloxicam]  #  JAN 2022- COVID [s/p Mab infusion; pills; skin rash resolved.]  # NGS/F-ONE-MUTATED K-RAS [G]  # PALLIATIVE CARE EVALUATION: 09/20/2019-[name]  # PAIN MANAGEMENT: NA   DIAGNOSIS: COLON CANCER  STAGE:  IV     ;  GOALS:Palliative  CURRENT/MOST RECENT THERAPY : FOLFIRI+ avastin [C]    Cancer of right colon ([place])  07/05/2019 Initial Diagnosis   Cancer of right colon ([place])   07/24/2019 - 06/25/2020 Chemotherapy   The patient had dexamethasone (DECADRON) 4 MG tablet, 8 mg, Oral, Daily, 1 of 1 cycle, Start date: --, End date: -- palonosetron (ALOXI) injection 0.25 mg, 0.25 mg, Intravenous,  Once, 23 of 25 cycles Administration: 0.25 mg (07/24/2019), 0.25 mg (08/07/2019), 0.25 mg (08/21/2019), 0.25 mg (09/04/2019), 0.25 mg (09/20/2019), 0.25 mg (10/04/2019), 0.25 mg (10/16/2019), 0.25 mg (10/30/2019), 0.25 mg (11/22/2019), 0.25 mg (12/06/2019), 0.25 mg (12/20/2019), 0.25 mg (01/03/2020), 0.25 mg (01/29/2020), 0.25 mg (02/12/2020), 0.25 mg (02/26/2020), 0.25 mg (03/11/2020), 0.25 mg (03/25/2020), 0.25 mg (04/08/2020), 0.25 mg (04/24/2020), 0.25 mg (05/08/2020), 0.25 mg (05/22/2020), 0.25 mg (06/10/2020), 0.25 mg (06/25/2020) leucovorin 800 mg in dextrose 5 % 250 mL infusion, 844 mg, Intravenous,  Once, 23 of 25 cycles Administration: 800 mg (07/24/2019), 800 mg (08/07/2019), 800 mg (08/21/2019), 800 mg (09/04/2019), 800 mg (09/20/2019), 800 mg (10/04/2019), 800 mg (10/16/2019), 800 mg (10/30/2019), 800 mg (11/22/2019), 800 mg (12/06/2019), 800 mg (12/20/2019), 800 mg (01/03/2020), 800 mg (01/29/2020), 800 mg (02/12/2020), 800 mg (02/26/2020), 800 mg (03/11/2020), 800 mg (03/25/2020), 800 mg (04/08/2020), 800 mg (04/24/2020), 800 mg (05/08/2020), 800 mg (05/22/2020), 800 mg (06/10/2020), 800 mg (06/25/2020) oxaliplatin (ELOXATIN) 180 mg in dextrose 5 % 500 mL chemo infusion, 85 mg/m2 = 180 mg, Intravenous,  Once, 23 of 25 cycles Dose modification: 178 mg (original dose 85 mg/m2, Cycle 17, Reason:  Other (see comments), Comment: insurance adjusted dose  ) Administration: 180 mg (07/24/2019), 180 mg (08/07/2019), 180 mg (08/21/2019), 180 mg (09/04/2019), 180 mg (09/20/2019), 180 mg (10/04/2019), 180 mg (10/16/2019), 180 mg (10/30/2019), 180 mg (11/22/2019), 180 mg (12/06/2019), 180 mg (12/20/2019), 180 mg (01/03/2020), 180 mg (01/29/2020), 180 mg (02/12/2020), 180 mg (02/26/2020), 180 mg (03/11/2020), 180 mg (04/08/2020), 180 mg (04/24/2020), 180 mg (05/08/2020), 180 mg (05/22/2020), 180 mg (06/10/2020), 180 mg (06/25/2020) fluorouracil (ADRUCIL) 5,000 mg in sodium chloride 0.9 % 150 mL chemo infusion, 5,050 mg, Intravenous, 1 Day/Dose, 23 of 25 cycles Administration: 5,000 mg (07/24/2019), 5,000 mg (08/07/2019), 5,000 mg (08/21/2019), 5,050 mg (09/04/2019), 5,000 mg (10/04/2019), 5,000 mg (10/16/2019), 5,000 mg (11/22/2019), 5,000 mg (12/06/2019), 5,000 mg (12/20/2019), 5,000 mg (01/03/2020), 5,000 mg (01/29/2020), 5,000 mg (02/12/2020), 5,000 mg (02/26/2020), 5,000 mg (03/11/2020), 5,000 mg (03/25/2020), 5,000 mg (04/08/2020), 5,000 mg (04/24/2020), 5,000 mg (05/08/2020), 5,000 mg (05/22/2020), 5,000 mg (06/10/2020), 5,000 mg (06/25/2020) bevacizumab-bvzr (ZIRABEV) 400 mg in sodium chloride 0.9 % 100 mL chemo infusion, 5 mg/kg = 400 mg, Intravenous,  Once, 23 of 25 cycles Administration: 400 mg (08/07/2019), 400 mg (08/21/2019), 400 mg (09/04/2019), 400 mg (09/20/2019), 400 mg (10/04/2019), 400 mg (10/16/2019), 400 mg (10/30/2019), 400 mg (11/22/2019), 400 mg (12/06/2019), 400 mg (12/20/2019), 400 mg (01/03/2020), 400 mg (01/29/2020), 400 mg (02/12/2020), 400 mg (02/26/2020), 400 mg (03/11/2020), 400 mg (03/25/2020), 400 mg (04/08/2020), 400 mg (04/24/2020), 400 mg (05/08/2020), 400 mg (05/22/2020), 400 mg (06/10/2020), 400 mg (06/25/2020)   for chemotherapy treatment.     07/08/2020 -  Chemotherapy   Patient is on Treatment Plan : COLORECTAL FOLFIRI / BEVACIZUMAB Q14D     11/05/2020 Cancer Staging   Staging form: Colon and Rectum, AJCC 8th Edition - Clinical: Stage IVC (pM1c) - Signed by Cammie Sickle, MD on  11/05/2020      HISTORY OF PRESENTING ILLNESS: Alone.  Ambulating independently.  Oscar [name] 80 y.o.  male with metastatic colon cancer to the liver- -currently on bevacizumab plus Lonsurf is here for follow-up.  Denies any nausea vomiting.  No fever no chills. No nausea no vomiting.  No sores in the mouth.  No diarrhea.  Review of Systems  Constitutional:  Positive for malaise/fatigue. Negative for chills, diaphoresis and fever.  HENT:  Negative for nosebleeds and sore throat.   Eyes:  Negative for double vision.  Respiratory:  Negative for cough, hemoptysis, sputum production, shortness of breath and wheezing.   Cardiovascular:  Negative for chest pain, palpitations, orthopnea and leg swelling.  Gastrointestinal:  Negative for blood in stool, constipation, heartburn, melena, nausea and vomiting.  Genitourinary:  Negative for dysuria, frequency and urgency.  Musculoskeletal:  Positive for back pain and joint pain.  Skin: Negative.  Negative for itching and rash.  Neurological:  Positive for tingling. Negative for focal weakness, weakness and headaches.  Endo/Heme/Allergies:  Does not bruise/bleed easily.  Psychiatric/Behavioral:  Negative for depression. The patient is not nervous/anxious and does not have insomnia.     MEDICAL HISTORY:  Past Medical History:  Diagnosis Date  . Arthritis   . Cancer ([place])    colon cancer 02/2019 per pt   . Family history of adverse reaction to anesthesia    cousin took all night to wake up from anesthesia  . H/O colon cancer, stage IV   . Hyperlipemia   . Neuromuscular disorder ([place])   . Pre-diabetes     SURGICAL HISTORY: Past Surgical History:  Procedure Laterality Date  . COLON SURGERY    .  ESOPHAGOGASTRODUODENOSCOPY (EGD) WITH PROPOFOL N/A 12/26/2020   Procedure: ESOPHAGOGASTRODUODENOSCOPY (EGD) WITH PROPOFOL;  Surgeon: [name], MD;  Location: [facility] ENDOSCOPY;  Service: Gastroenterology;  Laterality: N/A;  . IR ANGIOGRAM  SELECTIVE EACH ADDITIONAL VESSEL  12/19/2020  . IR ANGIOGRAM SELECTIVE EACH ADDITIONAL VESSEL  03/24/2021  . IR ANGIOGRAM SELECTIVE EACH ADDITIONAL VESSEL  03/24/2021  . IR ANGIOGRAM SELECTIVE EACH ADDITIONAL VESSEL  04/04/2021  . IR ANGIOGRAM SELECTIVE EACH ADDITIONAL VESSEL  04/04/2021  . IR ANGIOGRAM SELECTIVE EACH ADDITIONAL VESSEL  04/04/2021  . IR ANGIOGRAM VISCERAL SELECTIVE  12/19/2020  . IR ANGIOGRAM VISCERAL SELECTIVE  03/24/2021  . IR ANGIOGRAM VISCERAL SELECTIVE  04/04/2021  . IR ANGIOGRAM VISCERAL SELECTIVE  04/04/2021  . IR EMBO ARTERIAL NOT HEMORR HEMANG INC GUIDE ROADMAPPING  03/24/2021  . IR EMBO TUMOR ORGAN ISCHEMIA INFARCT INC GUIDE ROADMAPPING  12/19/2020  . IR EMBO TUMOR ORGAN ISCHEMIA INFARCT INC GUIDE ROADMAPPING  04/04/2021  . IR IMAGING GUIDED PORT INSERTION  07/20/2019  . IR RADIOLOGIST EVAL & MGMT  12/05/2020  . IR RADIOLOGIST EVAL & MGMT  02/11/2021  . IR RADIOLOGIST EVAL & MGMT  03/04/2021  . IR RADIOLOGIST EVAL & MGMT  04/29/2021  . IR RADIOLOGIST EVAL & MGMT  07/22/2021  . IR US GUIDE VASC ACCESS RIGHT  12/19/2020  . IR US GUIDE VASC ACCESS RIGHT  03/24/2021  . IR US GUIDE VASC ACCESS RIGHT  04/04/2021  . JOINT REPLACEMENT Left 2010   Knee  . RADIOLOGY WITH ANESTHESIA N/A 01/15/2021   Procedure: CT WITH ANESTHESIA MICROWAVE ABLATION OF LIVER;  Surgeon: Criselda Peaches, MD;  Location: [facility] ORS;  Service: Anesthesiology;  Laterality: N/A;    SOCIAL HISTORY: Social History   Socioeconomic History  . Marital status: Married    Spouse name: Not on file  . Number of children: Not on file  . Years of education: Not on file  . Highest education level: Not on file  Occupational History  . Not on file  Tobacco Use  . Smoking status: Every Day    Packs/day: 0.25    Years: 15.00    Pack years: 3.75    Types: Cigarettes  . Smokeless tobacco: Never  . Tobacco comments:    1 to 2 cigarettes a day occasionally  Vaping Use  . Vaping Use: Never used  Substance and Sexual Activity  .  Alcohol use: Yes    Comment: rarely   . Drug use: No  . Sexual activity: Not on file  Other Topics Concern  . Not on file  Social History Narrative   Recruitment consultant retd; lives in [place]; smoking 3cig/day; [3/4 ppd x started at 9 years]; no alcohol. Son & daughter; wife dementia [waiting for placement].    Social Determinants of Health   Financial Resource Strain: Not on file  Food Insecurity: Not on file  Transportation Needs: Not on file  Physical Activity: Not on file  Stress: Not on file  Social Connections: Not on file  Intimate Partner Violence: Not on file    FAMILY HISTORY: Family History  Problem Relation Age of Onset  . Peptic Ulcer Disease Father     ALLERGIES:  is allergic to ace inhibitors.  MEDICATIONS:  Current Outpatient Medications  Medication Sig Dispense Refill  . amLODipine (NORVASC) 5 MG tablet Take 5 mg by mouth daily.    . sucralfate (CARAFATE) 1 g tablet Take 1 tablet (1 g total) by mouth 4 (four) times daily -  with meals and  at bedtime. 90 tablet 3  . tamsulosin (FLOMAX) 0.4 MG CAPS capsule Take 1 capsule (0.4 mg total) by mouth daily. 30 capsule 11  . trifluridine-tipiracil (LONSURF) 15-6.14 MG tablet Take 5 tablets (75 mg of trifluridine total) by mouth 2 (two) times daily after a meal. 1 hr after AM & PM meals on days 1-5, 8-12. Repeat every 28day 100 tablet   . ondansetron (ZOFRAN) 4 MG tablet Take 1 tablet (4 mg total) by mouth every 8 (eight) hours as needed for nausea or vomiting. (Patient not taking: No sig reported) 20 tablet 0  . ondansetron (ZOFRAN) 8 MG tablet Take 1 tablet (8 mg total) by mouth every 8 (eight) hours as needed for nausea or vomiting. (Patient not taking: No sig reported) 20 tablet 0  . oxyCODONE (OXY IR/ROXICODONE) 5 MG immediate release tablet Take 1 tablet (5 mg total) by mouth every 12 (twelve) hours as needed for severe pain. 60 tablet 0  . pravastatin (PRAVACHOL) 40 MG tablet Take 40 mg by mouth daily.   (Patient not taking: No sig reported)    . prochlorperazine (COMPAZINE) 10 MG tablet Take 1 tablet (10 mg total) by mouth every 6 (six) hours as needed for nausea or vomiting. (Patient not taking: No sig reported) 60 tablet 0   No current facility-administered medications for this visit.      Marland Kitchen  PHYSICAL EXAMINATION: ECOG PERFORMANCE STATUS: 0 - Asymptomatic  Vitals:   09/01/21 0935  BP: 124/65  Pulse: 62  Resp: 16  Temp: 97.8 F (36.6 C)   Filed Weights   09/01/21 0935  Weight: 183 lb 3.2 oz (83.1 kg)    Physical Exam HENT:     Head: Normocephalic and atraumatic.     Mouth/Throat:     Pharynx: No oropharyngeal exudate.  Eyes:     Pupils: Pupils are equal, round, and reactive to light.  Cardiovascular:     Rate and Rhythm: Normal rate and regular rhythm.  Pulmonary:     Effort: No respiratory distress.     Breath sounds: No wheezing.     Comments: Decreased breath sounds bilaterally.  No wheeze or crackles Abdominal:     General: Bowel sounds are normal. There is no distension.     Palpations: Abdomen is soft. There is no mass.     Tenderness: There is no abdominal tenderness. There is no guarding or rebound.  Musculoskeletal:        General: No tenderness. Normal range of motion.     Cervical back: Normal range of motion and neck supple.  Skin:    General: Skin is warm.  Neurological:     Mental Status: He is alert and oriented to person, place, and time.  Psychiatric:        Mood and Affect: Affect normal.   LABORATORY DATA:  I have reviewed the data as listed Lab Results  Component Value Date   WBC 2.0 (L) 09/01/2021   HGB 10.6 (L) 09/01/2021   HCT 30.9 (L) 09/01/2021   MCV 104.7 (H) 09/01/2021   PLT 72 (L) 09/01/2021   Recent Labs    08/04/21 0854 08/18/21 0831 09/01/21 0847  NA 136 135 136  K 3.5 3.8 3.7  CL 106 103 104  CO2 _0 GLUCOSE 190* 217* 204*  BUN _1 CREATININE 0.82 0.88 0.81  CALCIUM 8.1* 8.1* 8.2*  GFRNONAA >60  >60 >60  PROT 7.4 7.4 7.6  ALBUMIN 3.0* 2.9* 3.0*  AST 35 41 37  ALT _0 ALKPHOS 161* 167* 149*  BILITOT 1.0 0.7 0.8    RADIOGRAPHIC STUDIES: I have personally reviewed the radiological images as listed and agreed with the findings in the report. No results found.   ASSESSMENT & PLAN:   Cancer of right colon ([place]) #Right-sided colon adenocarcinoma-with synchronous metastasis to liver/unresectable.   s/p Ablation- MAY 2022 MRI- Multiple lvier lesions/progression-patient underwent s/p Y 90 on 06/17; ON 10/02- mri-MRI liver- Treated lesions of the hepatic dome and segment VI demonstrate no residual contrast enhancement. New moderate left pleural effusion with multiple new large pleural nodules, concerning for pleural metastatic disease. New enlarged periportal and retroperitoneal lymph nodes, findings are concerning for progressive metastatic disease. Increased size of heterogeneous solid lesion of the upper pole of the right kidney, concerning for additional site of metastatic Disease. Peritoneal implant of the right lower quadrant seen just inferior to area of right hemicolectomy is increased in size when compared with prior CT. Comparison with prior MRI is limited due to differences in scan field-of-view, but possibly slightly increased in size. Currently on oral TAS-102 (*35 mg/m2 twice daily on days 1-5 and 8-12 every 28 days]+ BEV q 2 W.    # proceed with cycle # 5 Zirabev. Labs today reviewed;  acceptable for treatment today; except UA- 30; Currently on dose reduced to 4 [14x4=60] pills BID- M-F.  Will plan imaging again in end of Nov- [place], 2022.   # UA protein: 100- check Urine protein ratio today; Protein Creatinine Ratio: 0.05- STABLE.   # Hypoalbuminemia/malnutrition: recommend Ensure/proetin intake- STABLE.   # PN- G-1-2; sec to oxaliplatin-   # Mediport- functioning-STABLE>  # Shoulder pain-refill oxycodone prn [place] ]- STABLE>  # DISPOSITION:   # zirabev today.  #  follow up in 2 weeks-  MD; labs- cbc/cmp CEA/UA; zirabev-Dr.B      All questions were answered. The patient knows to call the clinic with any problems, questions or concerns.    Cammie Sickle, MD 09/01/2021 12:36 PM

## 2021-09-01 NOTE — Patient Instructions (Signed)
CANCER CENTER [facility]  Discharge Instructions: Thank you for choosing [facility] to provide your oncology and hematology care.  If you have a lab appointment with the Cancer Center, please go directly to the Cancer Center and check in at the registration area.  Wear comfortable clothing and clothing appropriate for easy access to any Portacath or PICC line.   We strive to give you quality time with your provider. You may need to reschedule your appointment if you arrive late (15 or more minutes).  Arriving late affects you and other patients whose appointments are after yours.  Also, if you miss three or more appointments without notifying the office, you may be dismissed from the clinic at the provider's discretion.      For prescription refill requests, have your pharmacy contact our office and allow 72 hours for refills to be completed.    Today you received the following chemotherapy and/or immunotherapy agents Avastin    To help prevent nausea and vomiting after your treatment, we encourage you to take your nausea medication as directed.  BELOW ARE SYMPTOMS THAT SHOULD BE REPORTED IMMEDIATELY: *FEVER GREATER THAN 100.4 F (38 C) OR HIGHER *CHILLS OR SWEATING *NAUSEA AND VOMITING THAT IS NOT CONTROLLED WITH YOUR NAUSEA MEDICATION *UNUSUAL SHORTNESS OF BREATH *UNUSUAL BRUISING OR BLEEDING *URINARY PROBLEMS (pain or burning when urinating, or frequent urination) *BOWEL PROBLEMS (unusual diarrhea, constipation, pain near the anus) TENDERNESS IN MOUTH AND THROAT WITH OR WITHOUT PRESENCE OF ULCERS (sore throat, sores in mouth, or a toothache) UNUSUAL RASH, SWELLING OR PAIN  UNUSUAL VAGINAL DISCHARGE OR ITCHING   Items with * indicate a potential emergency and should be followed up as soon as possible or go to the Emergency Department if any problems should occur.  Please show the CHEMOTHERAPY ALERT CARD or IMMUNOTHERAPY ALERT CARD at check-in to  the Emergency Department and triage nurse.  Should you have questions after your visit or need to cancel or reschedule your appointment, please contact CANCER CENTER  [facility]  [phone] and follow the prompts.  Office hours are 8:00 a.m. to 4:30 p.m. Monday - Friday. Please note that voicemails left after 4:00 p.m. may not be returned until the following business day.  We are closed weekends and major holidays. You have access to a nurse at all times for urgent questions. Please call the main number to the clinic [phone] and follow the prompts.  For any non-urgent questions, you may also contact your provider using MyChart. We now offer e-Visits for anyone 18 and older to request care online for non-urgent symptoms. For details visit [email or web address].   Also download the MyChart app! Go to the app store, search "MyChart", open the app, select [facility], and log in with your MyChart username and password.  Due to Covid, a mask is required upon entering the hospital/clinic. If you do not have a mask, one will be given to you upon arrival. For doctor visits, patients may have 1 support person aged 18 or older with them. For treatment visits, patients cannot have anyone with them due to current Covid guidelines and our immunocompromised population.  

## 2021-09-01 NOTE — Assessment & Plan Note (Addendum)
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   s/p Ablation- MAY 2022 MRI- Multiple lvier lesions/progression-patient underwent s/p Y 90 on 06/17; ON 10/02- mri-MRI liver- Treated lesions of the hepatic dome and segment VI demonstrate no residual contrast enhancement. New moderate left pleural effusion with multiple new large pleural nodules, concerning for pleural metastatic disease. New enlarged periportal and retroperitoneal lymph nodes, findings are concerning for progressive metastatic disease. Increased size of heterogeneous solid lesion of the upper pole of the right kidney, concerning for additional site of metastatic Disease. Peritoneal implant of the right lower quadrant seen just inferior to area of right hemicolectomy is increased in size when compared with prior CT. Comparison with prior MRI is limited due to differences in scan field-of-view, but possibly slightly increased in size. Currently on oral TAS-102 (*35 mg/m2 twice daily on days 1-5 and 8-12 every 28 days]+ BEV q 2 W.    # proceed with cycle # 5 Zirabev. Labs today reviewed;  acceptable for treatment today; except UA- 30; Currently on dose reduced to 4 [14x4=60] pills BID- M-F.  Will plan imaging again in end of Nov- [place], 2022.   # UA protein: 100- check Urine protein ratio today; Protein Creatinine Ratio: 0.05- STABLE.   # Hypoalbuminemia/malnutrition: recommend Ensure/proetin intake- STABLE.   # PN- G-1-2; sec to oxaliplatin-   # Mediport- functioning-STABLE>  # Shoulder pain-refill oxycodone prn Silver.Hawking ]- STABLE>  # DISPOSITION:  # zirabev today.  # follow up in 2 weeks-  MD; labs- cbc/cmp CEA/UA; zirabev-Dr.B

## 2021-09-01 NOTE — Progress Notes (Signed)
Patient had an episode of dizziness 2 weeks ago but thinks it is because he got to hot.  Is interested in getting flu shot today if possible.

## 2021-09-02 LAB — CEA: CEA: 107 ng/mL — ABNORMAL HIGH (ref 0.0–4.7)

## 2021-09-15 ENCOUNTER — Inpatient Hospital Stay: Payer: Medicare HMO | Admitting: Internal Medicine

## 2021-09-15 ENCOUNTER — Inpatient Hospital Stay: Payer: Medicare HMO

## 2021-09-15 ENCOUNTER — Other Ambulatory Visit: Payer: Self-pay | Admitting: Internal Medicine

## 2021-09-15 ENCOUNTER — Telehealth: Payer: Self-pay | Admitting: *Deleted

## 2021-09-15 DIAGNOSIS — C182 Malignant neoplasm of ascending colon: Secondary | ICD-10-CM

## 2021-09-15 MED ORDER — LONSURF 15-6.14 MG PO TABS: ORAL_TABLET | ORAL | 3 refills | Status: DC

## 2021-09-15 NOTE — Telephone Encounter (Signed)
His WBC/ANC have been low with treatment. Since he is not feeling well, I would have him hold his Lonsurf until he returns to clinic.

## 2021-09-15 NOTE — Telephone Encounter (Signed)
He is resch till 12/8 and pt wants to know if he needs to take his Lonsurf pills now or wait till he sees you. Please advise.

## 2021-09-15 NOTE — Telephone Encounter (Signed)
Patient notified

## 2021-09-15 NOTE — Telephone Encounter (Signed)
Patient called reporting that he has sore throat and runny nose since yesterday and feels he should reschedule his appointment for today. Please return his call

## 2021-09-15 NOTE — Telephone Encounter (Signed)
They are going more upgrades ill call once completed

## 2021-09-15 NOTE — Progress Notes (Signed)
Lonsurf-Prescription sent.

## 2021-09-16 MED ORDER — LONSURF 15-6.14 MG PO TABS: 60.0000 mg | ORAL_TABLET | Freq: Two times a day (BID) | ORAL | 3 refills | Status: DC

## 2021-09-16 NOTE — Addendum Note (Signed)
Addended by: Darl Pikes on: 09/16/2021 03:02 PM   Modules accepted: Orders

## 2021-09-25 ENCOUNTER — Other Ambulatory Visit: Payer: Self-pay

## 2021-09-25 ENCOUNTER — Inpatient Hospital Stay: Payer: Medicare HMO | Admitting: Internal Medicine

## 2021-09-25 ENCOUNTER — Inpatient Hospital Stay: Payer: Medicare HMO | Attending: Hospice and Palliative Medicine

## 2021-09-25 ENCOUNTER — Inpatient Hospital Stay: Payer: Medicare HMO

## 2021-09-25 ENCOUNTER — Encounter: Payer: Self-pay | Admitting: Internal Medicine

## 2021-09-25 DIAGNOSIS — C182 Malignant neoplasm of ascending colon: Secondary | ICD-10-CM

## 2021-09-25 DIAGNOSIS — C787 Secondary malignant neoplasm of liver and intrahepatic bile duct: Secondary | ICD-10-CM | POA: Insufficient documentation

## 2021-09-25 DIAGNOSIS — Z5112 Encounter for antineoplastic immunotherapy: Secondary | ICD-10-CM | POA: Insufficient documentation

## 2021-09-25 DIAGNOSIS — Z7189 Other specified counseling: Secondary | ICD-10-CM

## 2021-09-25 DIAGNOSIS — Z79899 Other long term (current) drug therapy: Secondary | ICD-10-CM | POA: Insufficient documentation

## 2021-09-25 LAB — COMPREHENSIVE METABOLIC PANEL
ALT: 16 U/L (ref 0–44)
AST: 34 U/L (ref 15–41)
Albumin: 2.7 g/dL — ABNORMAL LOW (ref 3.5–5.0)
Alkaline Phosphatase: 165 U/L — ABNORMAL HIGH (ref 38–126)
Anion gap: 10 (ref 5–15)
BUN: 10 mg/dL (ref 8–23)
CO2: 24 mmol/L (ref 22–32)
Calcium: 8.2 mg/dL — ABNORMAL LOW (ref 8.9–10.3)
Chloride: 102 mmol/L (ref 98–111)
Creatinine, Ser: 0.86 mg/dL (ref 0.61–1.24)
GFR, Estimated: >60 mL/min (ref >60)
Glucose, Bld: 188 mg/dL — ABNORMAL HIGH (ref 70–99)
Potassium: 3.5 mmol/L (ref 3.5–5.1)
Sodium: 136 mmol/L (ref 135–145)
Total Bilirubin: 0.8 mg/dL (ref 0.3–1.2)
Total Protein: 7.5 g/dL (ref 6.5–8.1)

## 2021-09-25 LAB — URINALYSIS, COMPLETE (UACMP) WITH MICROSCOPIC
Bacteria, UA: NONE SEEN
Glucose, UA: NEGATIVE mg/dL
Ketones, ur: 15 mg/dL — AB
Leukocytes,Ua: NEGATIVE
Nitrite: POSITIVE — AB
Protein, ur: 30 mg/dL — AB
Specific Gravity, Urine: >1.03 — ABNORMAL HIGH (ref 1.005–1.030)
pH: 5.5 (ref 5.0–8.0)

## 2021-09-25 LAB — CBC WITH DIFFERENTIAL/PLATELET
Abs Immature Granulocytes: 0.01 10*3/uL (ref 0.00–0.07)
Basophils Absolute: 0 10*3/uL (ref 0.0–0.1)
Basophils Relative: 1 %
Eosinophils Absolute: 0.1 10*3/uL (ref 0.0–0.5)
Eosinophils Relative: 2 %
HCT: 31.5 % — ABNORMAL LOW (ref 39.0–52.0)
Hemoglobin: 10.6 g/dL — ABNORMAL LOW (ref 13.0–17.0)
Immature Granulocytes: 0 %
Lymphocytes Relative: 20 %
Lymphs Abs: 0.7 10*3/uL (ref 0.7–4.0)
MCH: 36.2 pg — ABNORMAL HIGH (ref 26.0–34.0)
MCHC: 33.7 g/dL (ref 30.0–36.0)
MCV: 107.5 fL — ABNORMAL HIGH (ref 80.0–100.0)
Monocytes Absolute: 0.4 10*3/uL (ref 0.1–1.0)
Monocytes Relative: 10 %
Neutro Abs: 2.6 10*3/uL (ref 1.7–7.7)
Neutrophils Relative %: 67 %
Platelets: 123 10*3/uL — ABNORMAL LOW (ref 150–400)
RBC: 2.93 MIL/uL — ABNORMAL LOW (ref 4.22–5.81)
RDW: 16.7 % — ABNORMAL HIGH (ref 11.5–15.5)
WBC: 3.8 10*3/uL — ABNORMAL LOW (ref 4.0–10.5)
nRBC: 0 % (ref 0.0–0.2)

## 2021-09-25 MED ORDER — SODIUM CHLORIDE 0.9 % IV SOLN
5.0000 mg/kg | Freq: Once | INTRAVENOUS | Status: AC
  Administered 2021-09-25: 400 mg via INTRAVENOUS
  Filled 2021-09-25: qty 16

## 2021-09-25 MED ORDER — HEPARIN SOD (PORK) LOCK FLUSH 100 UNIT/ML IV SOLN
500.0000 [IU] | Freq: Once | INTRAVENOUS | Status: AC | PRN
  Filled 2021-09-25: qty 5

## 2021-09-25 MED ORDER — SODIUM CHLORIDE 0.9 % IV SOLN
Freq: Once | INTRAVENOUS | Status: AC
Start: 2021-09-25 — End: 2021-09-25
  Filled 2021-09-25: qty 250

## 2021-09-25 MED ORDER — HEPARIN SOD (PORK) LOCK FLUSH 100 UNIT/ML IV SOLN
INTRAVENOUS | Status: AC
  Administered 2021-09-25: 500 [IU]
  Filled 2021-09-25: qty 5

## 2021-09-25 NOTE — Progress Notes (Signed)
[facility] NOTE  Patient Care Team: Sofie Hartigan, MD as PCP - General (Family Medicine) Cammie Sickle, MD as Consulting Physician (Hematology and Oncology)  CHIEF COMPLAINTS/PURPOSE OF CONSULTATION: Colon cancer  #  Oncology History Overview Note  # MAY 2020- 3. 03/09/19 Liver biopsy. Microscopic examination shows malignant cells with glandular architecture consistent with adenocarcinoma. The malignant cells are positive for CK20 and CDX-2. These findings support the clinical impression of metastatic colon adenocarcinoma. 4. 03/10/19 R hemicolectomy. Tumor site cecum. Adenocarcinoma. Mucinous features present. G2. No tumor deposits. Invades visceral peritoneum. No tumor perforation. LVI present. PNI not identified. All margins uninvolved. 1/12 LNs. PT4apN1. Periappendiceal inflammation c/w resolving abscess. Microsatellite stable (MSS). [Dr.Mettu; [facility]]  # SEP 4th 2020 [compared to May 2020]  Interval increase in size of the metastases to the hepatic dome, The metastasis to the left hepatic lobe is unchanged; 2.  New subcentimeter hypoattenuating lesion in the inferior right hepatic lobe, incompletely characterized on CT. 3.  Postsurgical changes following right hemicolectomy.  # OCT 2020- FOLFOX +avastin; CT dec 22nd 2020- [compared to [facility] sep 9th 2020]-Liver- slight progression versus stable disease; CT scan SEP 4th 2021- Progressive disease-left lower lobe lung nodule 8 mm [previously 4 mm]; increase in size of the hepatic metastases by few millimeters.;  Soft tissue nodule adjacent to anastomotic site again increased by few millimeters.STOP FOLFOX; cont avastin  #  SEP 20th, 2021- FOLFIRI+ AVASTIN; HOLD FEB 2022- sec to poor tolerance [peptic ulcer]; 3/30-liver ablation-   [bland embolization of segment 2/3 liver lesion on 12/19/2020;  microwave ablation of both liver lesions.  # July 25th, 2022-   # FEB /16-EGD [Dr.Anna-duodenal ulcer-? meloxicam]  #  JAN 2022- COVID [s/p Mab infusion; pills; skin rash resolved.]  # NGS/F-ONE-MUTATED K-RAS [G]  # PALLIATIVE CARE EVALUATION: 09/20/2019-[name]  # PAIN MANAGEMENT: NA   DIAGNOSIS: COLON CANCER  STAGE:  IV     ;  GOALS:Palliative  CURRENT/MOST RECENT THERAPY : FOLFIRI+ avastin [C]    Cancer of right colon ([place])  07/05/2019 Initial Diagnosis   Cancer of right colon ([place])   07/24/2019 - 06/25/2020 Chemotherapy   The patient had dexamethasone (DECADRON) 4 MG tablet, 8 mg, Oral, Daily, 1 of 1 cycle, Start date: --, End date: -- palonosetron (ALOXI) injection 0.25 mg, 0.25 mg, Intravenous,  Once, 23 of 25 cycles Administration: 0.25 mg (07/24/2019), 0.25 mg (08/07/2019), 0.25 mg (08/21/2019), 0.25 mg (09/04/2019), 0.25 mg (09/20/2019), 0.25 mg (10/04/2019), 0.25 mg (10/16/2019), 0.25 mg (10/30/2019), 0.25 mg (11/22/2019), 0.25 mg (12/06/2019), 0.25 mg (12/20/2019), 0.25 mg (01/03/2020), 0.25 mg (01/29/2020), 0.25 mg (02/12/2020), 0.25 mg (02/26/2020), 0.25 mg (03/11/2020), 0.25 mg (03/25/2020), 0.25 mg (04/08/2020), 0.25 mg (04/24/2020), 0.25 mg (05/08/2020), 0.25 mg (05/22/2020), 0.25 mg (06/10/2020), 0.25 mg (06/25/2020) leucovorin 800 mg in dextrose 5 % 250 mL infusion, 844 mg, Intravenous,  Once, 23 of 25 cycles Administration: 800 mg (07/24/2019), 800 mg (08/07/2019), 800 mg (08/21/2019), 800 mg (09/04/2019), 800 mg (09/20/2019), 800 mg (10/04/2019), 800 mg (10/16/2019), 800 mg (10/30/2019), 800 mg (11/22/2019), 800 mg (12/06/2019), 800 mg (12/20/2019), 800 mg (01/03/2020), 800 mg (01/29/2020), 800 mg (02/12/2020), 800 mg (02/26/2020), 800 mg (03/11/2020), 800 mg (03/25/2020), 800 mg (04/08/2020), 800 mg (04/24/2020), 800 mg (05/08/2020), 800 mg (05/22/2020), 800 mg (06/10/2020), 800 mg (06/25/2020) oxaliplatin (ELOXATIN) 180 mg in dextrose 5 % 500 mL chemo infusion, 85 mg/m2 = 180 mg, Intravenous,  Once, 23 of 25 cycles Dose modification: 178 mg (original dose 85 mg/m2, Cycle 17, Reason:  Other (see comments), Comment: insurance adjusted dose  ) Administration: 180 mg (07/24/2019), 180 mg (08/07/2019), 180 mg (08/21/2019), 180 mg (09/04/2019), 180 mg (09/20/2019), 180 mg (10/04/2019), 180 mg (10/16/2019), 180 mg (10/30/2019), 180 mg (11/22/2019), 180 mg (12/06/2019), 180 mg (12/20/2019), 180 mg (01/03/2020), 180 mg (01/29/2020), 180 mg (02/12/2020), 180 mg (02/26/2020), 180 mg (03/11/2020), 180 mg (04/08/2020), 180 mg (04/24/2020), 180 mg (05/08/2020), 180 mg (05/22/2020), 180 mg (06/10/2020), 180 mg (06/25/2020) fluorouracil (ADRUCIL) 5,000 mg in sodium chloride 0.9 % 150 mL chemo infusion, 5,050 mg, Intravenous, 1 Day/Dose, 23 of 25 cycles Administration: 5,000 mg (07/24/2019), 5,000 mg (08/07/2019), 5,000 mg (08/21/2019), 5,050 mg (09/04/2019), 5,000 mg (10/04/2019), 5,000 mg (10/16/2019), 5,000 mg (11/22/2019), 5,000 mg (12/06/2019), 5,000 mg (12/20/2019), 5,000 mg (01/03/2020), 5,000 mg (01/29/2020), 5,000 mg (02/12/2020), 5,000 mg (02/26/2020), 5,000 mg (03/11/2020), 5,000 mg (03/25/2020), 5,000 mg (04/08/2020), 5,000 mg (04/24/2020), 5,000 mg (05/08/2020), 5,000 mg (05/22/2020), 5,000 mg (06/10/2020), 5,000 mg (06/25/2020) bevacizumab-bvzr (ZIRABEV) 400 mg in sodium chloride 0.9 % 100 mL chemo infusion, 5 mg/kg = 400 mg, Intravenous,  Once, 23 of 25 cycles Administration: 400 mg (08/07/2019), 400 mg (08/21/2019), 400 mg (09/04/2019), 400 mg (09/20/2019), 400 mg (10/04/2019), 400 mg (10/16/2019), 400 mg (10/30/2019), 400 mg (11/22/2019), 400 mg (12/06/2019), 400 mg (12/20/2019), 400 mg (01/03/2020), 400 mg (01/29/2020), 400 mg (02/12/2020), 400 mg (02/26/2020), 400 mg (03/11/2020), 400 mg (03/25/2020), 400 mg (04/08/2020), 400 mg (04/24/2020), 400 mg (05/08/2020), 400 mg (05/22/2020), 400 mg (06/10/2020), 400 mg (06/25/2020)   for chemotherapy treatment.     07/08/2020 -  Chemotherapy   Patient is on Treatment Plan : COLORECTAL FOLFIRI / BEVACIZUMAB Q14D     11/05/2020 Cancer Staging   Staging form: Colon and Rectum, AJCC 8th Edition - Clinical: Stage IVC (pM1c) - Signed by Cammie Sickle, MD on  11/05/2020      HISTORY OF PRESENTING ILLNESS: Alone.  Ambulating independently.  Oscar [name] 80 y.o.  male with metastatic colon cancer to the liver- -currently on bevacizumab plus Lonsurf is here for follow-up.  Interim had a flu- resolved. Held lonsurf.   Denies any nausea vomiting.  No fever no chills. No nausea no vomiting.  No sores in the mouth.  No diarrhea.  Review of Systems  Constitutional:  Positive for malaise/fatigue. Negative for chills, diaphoresis and fever.  HENT:  Negative for nosebleeds and sore throat.   Eyes:  Negative for double vision.  Respiratory:  Negative for cough, hemoptysis, sputum production, shortness of breath and wheezing.   Cardiovascular:  Negative for chest pain, palpitations, orthopnea and leg swelling.  Gastrointestinal:  Negative for blood in stool, constipation, heartburn, melena, nausea and vomiting.  Genitourinary:  Negative for dysuria, frequency and urgency.  Musculoskeletal:  Positive for back pain and joint pain.  Skin: Negative.  Negative for itching and rash.  Neurological:  Positive for tingling. Negative for focal weakness, weakness and headaches.  Endo/Heme/Allergies:  Does not bruise/bleed easily.  Psychiatric/Behavioral:  Negative for depression. The patient is not nervous/anxious and does not have insomnia.     MEDICAL HISTORY:  Past Medical History:  Diagnosis Date   Arthritis    Cancer ([place])    colon cancer 02/2019 per pt    Family history of adverse reaction to anesthesia    cousin took all night to wake up from anesthesia   H/O colon cancer, stage IV    Hyperlipemia    Neuromuscular disorder ([place])    Pre-diabetes     SURGICAL HISTORY: Past Surgical History:  Procedure Laterality Date   COLON SURGERY     ESOPHAGOGASTRODUODENOSCOPY (EGD) WITH PROPOFOL N/A 12/26/2020   Procedure: ESOPHAGOGASTRODUODENOSCOPY (EGD) WITH PROPOFOL;  Surgeon: [name], MD;  Location: [facility] ENDOSCOPY;  Service: Gastroenterology;   Laterality: N/A;   IR ANGIOGRAM SELECTIVE EACH ADDITIONAL VESSEL  12/19/2020   IR ANGIOGRAM SELECTIVE EACH ADDITIONAL VESSEL  03/24/2021   IR ANGIOGRAM SELECTIVE EACH ADDITIONAL VESSEL  03/24/2021   IR ANGIOGRAM SELECTIVE EACH ADDITIONAL VESSEL  04/04/2021   IR ANGIOGRAM SELECTIVE EACH ADDITIONAL VESSEL  04/04/2021   IR ANGIOGRAM SELECTIVE EACH ADDITIONAL VESSEL  04/04/2021   IR ANGIOGRAM VISCERAL SELECTIVE  12/19/2020   IR ANGIOGRAM VISCERAL SELECTIVE  03/24/2021   IR ANGIOGRAM VISCERAL SELECTIVE  04/04/2021   IR ANGIOGRAM VISCERAL SELECTIVE  04/04/2021   IR EMBO ARTERIAL NOT HEMORR HEMANG INC GUIDE ROADMAPPING  03/24/2021   IR EMBO TUMOR ORGAN ISCHEMIA INFARCT INC GUIDE ROADMAPPING  12/19/2020   IR EMBO TUMOR ORGAN ISCHEMIA INFARCT INC GUIDE ROADMAPPING  04/04/2021   IR IMAGING GUIDED PORT INSERTION  07/20/2019   IR RADIOLOGIST EVAL & MGMT  12/05/2020   IR RADIOLOGIST EVAL & MGMT  02/11/2021   IR RADIOLOGIST EVAL & MGMT  03/04/2021   IR RADIOLOGIST EVAL & MGMT  04/29/2021   IR RADIOLOGIST EVAL & MGMT  07/22/2021   IR US GUIDE VASC ACCESS RIGHT  12/19/2020   IR US GUIDE VASC ACCESS RIGHT  03/24/2021   IR US GUIDE VASC ACCESS RIGHT  04/04/2021   JOINT REPLACEMENT Left 2010   Knee   RADIOLOGY WITH ANESTHESIA N/A 01/15/2021   Procedure: CT WITH ANESTHESIA MICROWAVE ABLATION OF LIVER;  Surgeon: Criselda Peaches, MD;  Location: [facility] ORS;  Service: Anesthesiology;  Laterality: N/A;    SOCIAL HISTORY: Social History   Socioeconomic History   Marital status: Married    Spouse name: Not on file   Number of children: Not on file   Years of education: Not on file   Highest education level: Not on file  Occupational History   Not on file  Tobacco Use   Smoking status: Every Day    Packs/day: 0.25    Years: 15.00    Pack years: 3.75    Types: Cigarettes   Smokeless tobacco: Never   Tobacco comments:    1 to 2 cigarettes a day occasionally  Vaping Use   Vaping Use: Never used  Substance and Sexual Activity    Alcohol use: Yes    Comment: rarely    Drug use: No   Sexual activity: Not on file  Other Topics Concern   Not on file  Social History Narrative   Recruitment consultant retd; lives in [place]; smoking 3cig/day; [3/4 ppd x started at 9 years]; no alcohol. Son & daughter; wife dementia [waiting for placement].    Social Determinants of Health   Financial Resource Strain: Not on file  Food Insecurity: Not on file  Transportation Needs: Not on file  Physical Activity: Not on file  Stress: Not on file  Social Connections: Not on file  Intimate Partner Violence: Not on file    FAMILY HISTORY: Family History  Problem Relation Age of Onset   Peptic Ulcer Disease Father     ALLERGIES:  is allergic to ace inhibitors.  MEDICATIONS:  Current Outpatient Medications  Medication Sig Dispense Refill   amLODipine (NORVASC) 5 MG tablet Take 5 mg by mouth daily.     oxyCODONE (OXY IR/ROXICODONE) 5 MG immediate release tablet Take 1 tablet (  5 mg total) by mouth every 12 (twelve) hours as needed for severe pain. 60 tablet 0   sucralfate (CARAFATE) 1 g tablet Take 1 tablet (1 g total) by mouth 4 (four) times daily -  with meals and at bedtime. 90 tablet 3   tamsulosin (FLOMAX) 0.4 MG CAPS capsule Take 1 capsule (0.4 mg total) by mouth daily. 30 capsule 11   ondansetron (ZOFRAN) 4 MG tablet Take 1 tablet (4 mg total) by mouth every 8 (eight) hours as needed for nausea or vomiting. (Patient not taking: Reported on 02/24/2021) 20 tablet 0   ondansetron (ZOFRAN) 8 MG tablet Take 1 tablet (8 mg total) by mouth every 8 (eight) hours as needed for nausea or vomiting. (Patient not taking: Reported on 02/24/2021) 20 tablet 0   pravastatin (PRAVACHOL) 40 MG tablet Take 40 mg by mouth daily.  (Patient not taking: Reported on 06/09/2021)     prochlorperazine (COMPAZINE) 10 MG tablet Take 1 tablet (10 mg total) by mouth every 6 (six) hours as needed for nausea or vomiting. (Patient not taking: Reported on  01/28/2021) 60 tablet 0   trifluridine-tipiracil (LONSURF) 15-6.14 MG tablet Take 4 tablets (60 mg of trifluridine total) by mouth 2 (two) times daily after a meal. Take with in 1 hr after AM & PM meals on days 1-5, 8-12. Repeat every 28 day. (Patient not taking: Reported on 09/25/2021) 80 tablet 3   No current facility-administered medications for this visit.   Facility-Administered Medications Ordered in Other Visits  Medication Dose Route Frequency Provider Last Rate Last Admin   heparin lock flush 100 UNIT/ML injection            heparin lock flush 100 unit/mL  500 Units Intracatheter Once PRN Earna Coder, MD          .  PHYSICAL EXAMINATION: ECOG PERFORMANCE STATUS: 0 - Asymptomatic  Vitals:   09/25/21 0858  BP: (!) 151/79  Pulse: 75  Resp: 16  Temp: 98.5 F (36.9 C)  SpO2: 100%   Filed Weights   09/25/21 0858  Weight: 182 lb (82.6 kg)    Physical Exam HENT:     Head: Normocephalic and atraumatic.     Mouth/Throat:     Pharynx: No oropharyngeal exudate.  Eyes:     Pupils: Pupils are equal, round, and reactive to light.  Cardiovascular:     Rate and Rhythm: Normal rate and regular rhythm.  Pulmonary:     Effort: No respiratory distress.     Breath sounds: No wheezing.     Comments: Decreased breath sounds bilaterally.  No wheeze or crackles Abdominal:     General: Bowel sounds are normal. There is no distension.     Palpations: Abdomen is soft. There is no mass.     Tenderness: There is no abdominal tenderness. There is no guarding or rebound.  Musculoskeletal:        General: No tenderness. Normal range of motion.     Cervical back: Normal range of motion and neck supple.  Skin:    General: Skin is warm.  Neurological:     Mental Status: He is alert and oriented to person, place, and time.  Psychiatric:        Mood and Affect: Affect normal.   LABORATORY DATA:  I have reviewed the data as listed Lab Results  Component Value Date   WBC 3.8 (L)  09/25/2021   HGB 10.6 (L) 09/25/2021   HCT 31.5 (L) 09/25/2021  MCV 107.5 (H) 09/25/2021   PLT 123 (L) 09/25/2021   Recent Labs    08/18/21 0831 09/01/21 0847 09/25/21 0829  NA 135 136 136  K 3.8 3.7 3.5  CL 103 104 102  CO2 $Re'25 26 24  'FtC$ GLUCOSE 217* 204* 188*  BUN $Re'12 14 10  'hAL$ CREATININE 0.88 0.81 0.86  CALCIUM 8.1* 8.2* 8.2*  GFRNONAA >60 >60 >60  PROT 7.4 7.6 7.5  ALBUMIN 2.9* 3.0* 2.7*  AST 41 37 34  ALT $Re'21 17 16  'KRR$ ALKPHOS 167* 149* 165*  BILITOT 0.7 0.8 0.8    RADIOGRAPHIC STUDIES: I have personally reviewed the radiological images as listed and agreed with the findings in the report. No results found.   ASSESSMENT & PLAN:   Cancer of right colon ([place]) #Right-sided colon adenocarcinoma-with synchronous metastasis to liver/unresectable.   s/p Ablation- MAY 2022 MRI- Multiple lvier lesions/progression-patient underwent s/p Y 90 on 06/17; ON 10/02- mri-MRI liver- Treated lesions of the hepatic dome and segment VI demonstrate no residual contrast enhancement. New moderate left pleural effusion with multiple new large pleural nodules, concerning for pleural metastatic disease. New enlarged periportal and retroperitoneal lymph nodes, findings are concerning for progressive metastatic disease. Increased size of heterogeneous solid lesion of the upper pole of the right kidney, concerning for additional site of metastatic Disease. Peritoneal implant of the right lower quadrant seen just inferior to area of right hemicolectomy is increased in size when compared with prior CT. Comparison with prior MRI is limited due to differences in scan field-of-view, but possibly slightly increased in size. Currently on oral TAS-102 (*35 mg/m2 twice daily on days 1-5 and 8-12 every 28 days]+ BEV q 2 W.    # proceed with cycle # 6 Zirabev. Labs today reviewed;  acceptable for treatment today; except UA- 30; Currently on dose reduced to 4 [14x4=60] pills BID- M-F.  Start Lonsurf on dec 12th.   # UA  protein: 100- check Urine protein ratio today; Protein Creatinine Ratio: 0.05- STABLE.   # Hypoalbuminemia/malnutrition: recommend Ensure/proetin intake- STABLE.   # PN- G-1-2; sec to oxaliplatin-   # Mediport- functioning-STABLE>  # Shoulder pain-refill oxycodone prn Silver.Hawking ]- STABLE  # DISPOSITION:   # zirabev today.  # follow up on dec 27th- MD; labs- cbc/cmp CEA/UA; zirabev; prior-CT CAP-Dr.B      All questions were answered. The patient knows to call the clinic with any problems, questions or concerns.    Cammie Sickle, MD 09/25/2021 10:08 AM

## 2021-09-25 NOTE — Assessment & Plan Note (Addendum)
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   s/p Ablation- MAY 2022 MRI- Multiple lvier lesions/progression-patient underwent s/p Y 90 on 06/17; ON 10/02- mri-MRI liver- Treated lesions of the hepatic dome and segment VI demonstrate no residual contrast enhancement. New moderate left pleural effusion with multiple new large pleural nodules, concerning for pleural metastatic disease. New enlarged periportal and retroperitoneal lymph nodes, findings are concerning for progressive metastatic disease. Increased size of heterogeneous solid lesion of the upper pole of the right kidney, concerning for additional site of metastatic Disease. Peritoneal implant of the right lower quadrant seen just inferior to area of right hemicolectomy is increased in size when compared with prior CT. Comparison with prior MRI is limited due to differences in scan field-of-view, but possibly slightly increased in size. Currently on oral TAS-102 (*35 mg/m2 twice daily on days 1-5 and 8-12 every 28 days]+ BEV q 2 W.    # proceed with cycle # 6 Zirabev. Labs today reviewed;  acceptable for treatment today; except UA- 30; Currently on dose reduced to 4 [14x4=60] pills BID- M-F.  Start Lonsurf on dec 12th.   # UA protein: 100- check Urine protein ratio today; Protein Creatinine Ratio: 0.05- STABLE.   # Hypoalbuminemia/malnutrition: recommend Ensure/proetin intake- STABLE.   # PN- G-1-2; sec to oxaliplatin-   # Mediport- functioning-STABLE>  # Shoulder pain-refill oxycodone prn Silver.Hawking ]- STABLE  # DISPOSITION:  # zirabev today.  # follow up on dec 27th- MD; labs- cbc/cmp CEA/UA; zirabev; prior-CT CAP-Dr.B

## 2021-09-25 NOTE — Progress Notes (Signed)
Pt in for follow up, states he had flu 2 weeks ago and missed last appointment.  Pt reports [facility] on hold.

## 2021-09-25 NOTE — Patient Instructions (Signed)
[facility] [place]   Discharge Instructions: Thank you for choosing [facility] to provide your oncology and hematology care.  If you have a lab appointment with the [facility], please go directly to the [facility] and check in at the registration area.  Wear comfortable clothing and clothing appropriate for easy access to any Portacath or PICC line.   We strive to give you quality time with your provider. You may need to reschedule your appointment if you arrive late (15 or more minutes).  Arriving late affects you and other patients whose appointments are after yours.  Also, if you miss three or more appointments without notifying the office, you may be dismissed from the clinic at the provider's discretion.      For prescription refill requests, have your pharmacy contact our office and allow 72 hours for refills to be completed.    Today you received the following chemotherapy and/or immunotherapy agents - Noah Charon      To help prevent nausea and vomiting after your treatment, we encourage you to take your nausea medication as directed.  BELOW ARE SYMPTOMS THAT SHOULD BE REPORTED IMMEDIATELY: *FEVER GREATER THAN 100.4 F (38 C) OR HIGHER *CHILLS OR SWEATING *NAUSEA AND VOMITING THAT IS NOT CONTROLLED WITH YOUR NAUSEA MEDICATION *UNUSUAL SHORTNESS OF BREATH *UNUSUAL BRUISING OR BLEEDING *URINARY PROBLEMS (pain or burning when urinating, or frequent urination) *BOWEL PROBLEMS (unusual diarrhea, constipation, pain near the anus) TENDERNESS IN MOUTH AND THROAT WITH OR WITHOUT PRESENCE OF ULCERS (sore throat, sores in mouth, or a toothache) UNUSUAL RASH, SWELLING OR PAIN  UNUSUAL VAGINAL DISCHARGE OR ITCHING   Items with * indicate a potential emergency and should be followed up as soon as possible or go to the Emergency Department if any problems should occur.  Please show the CHEMOTHERAPY ALERT CARD or IMMUNOTHERAPY ALERT CARD at check-in to  the Emergency Department and triage nurse.  Should you have questions after your visit or need to cancel or reschedule your appointment, please contact [facility] [place] [facility] [place]  [phone] and follow the prompts.  Office hours are 8:00 a.m. to 4:30 p.m. Monday - Friday. Please note that voicemails left after 4:00 p.m. may not be returned until the following business day.  We are closed weekends and major holidays. You have access to a nurse at all times for urgent questions. Please call the main number to the clinic [phone] and follow the prompts.  For any non-urgent questions, you may also contact your provider using MyChart. We now offer e-Visits for anyone 13 and older to request care online for non-urgent symptoms. For details visit [email or web address].   Also download the MyChart app! Go to the app store, search "MyChart", open the app, select [facility], and log in with your MyChart username and password.  Due to Covid, a mask is required upon entering the hospital/clinic. If you do not have a mask, one will be given to you upon arrival. For doctor visits, patients may have 1 support person aged 19 or older with them. For treatment visits, patients cannot have anyone with them due to current Covid guidelines and our immunocompromised population.   Bevacizumab injection What is this medication? BEVACIZUMAB (be va SIZ yoo mab) is a monoclonal antibody. It is used to treat many types of cancer. This medicine may be used for other purposes; ask your health care provider or pharmacist if you have questions. COMMON BRAND NAME(S): Alymsys, Avastin, MVASI, Zirabev What should  I tell my care team before I take this medication? They need to know if you have any of these conditions: diabetes heart disease high blood pressure history of coughing up blood prior anthracycline chemotherapy (e.g., doxorubicin, daunorubicin, epirubicin) recent or ongoing radiation  therapy recent or planning to have surgery stroke an unusual or allergic reaction to bevacizumab, hamster proteins, mouse proteins, other medicines, foods, dyes, or preservatives pregnant or trying to get pregnant breast-feeding How should I use this medication? This medicine is for infusion into a vein. It is given by a health care professional in a hospital or clinic setting. Talk to your pediatrician regarding the use of this medicine in children. Special care may be needed. Overdosage: If you think you have taken too much of this medicine contact a poison control center or emergency room at once. NOTE: This medicine is only for you. Do not share this medicine with others. What if I miss a dose? It is important not to miss your dose. Call your doctor or health care professional if you are unable to keep an appointment. What may interact with this medication? Interactions are not expected. This list may not describe all possible interactions. Give your health care provider a list of all the medicines, herbs, non-prescription drugs, or dietary supplements you use. Also tell them if you smoke, drink alcohol, or use illegal drugs. Some items may interact with your medicine. What should I watch for while using this medication? Your condition will be monitored carefully while you are receiving this medicine. You will need important blood work and urine testing done while you are taking this medicine. This medicine may increase your risk to bruise or bleed. Call your doctor or health care professional if you notice any unusual bleeding. Before having surgery, talk to your health care provider to make sure it is ok. This drug can increase the risk of poor healing of your surgical site or wound. You will need to stop this drug for 28 days before surgery. After surgery, wait at least 28 days before restarting this drug. Make sure the surgical site or wound is healed enough before restarting this drug.  Talk to your health care provider if questions. Do not become pregnant while taking this medicine or for 6 months after stopping it. Women should inform their doctor if they wish to become pregnant or think they might be pregnant. There is a potential for serious side effects to an unborn child. Talk to your health care professional or pharmacist for more information. Do not breast-feed an infant while taking this medicine and for 6 months after the last dose. This medicine has caused ovarian failure in some women. This medicine may interfere with the ability to have a child. You should talk to your doctor or health care professional if you are concerned about your fertility. What side effects may I notice from receiving this medication? Side effects that you should report to your doctor or health care professional as soon as possible: allergic reactions like skin rash, itching or hives, swelling of the face, lips, or tongue chest pain or chest tightness chills coughing up blood high fever seizures severe constipation signs and symptoms of bleeding such as bloody or black, tarry stools; red or dark-brown urine; spitting up blood or brown material that looks like coffee grounds; red spots on the skin; unusual bruising or bleeding from the eye, gums, or nose signs and symptoms of a blood clot such as breathing problems; chest pain; severe, sudden  headache; pain, swelling, warmth in the leg signs and symptoms of a stroke like changes in vision; confusion; trouble speaking or understanding; severe headaches; sudden numbness or weakness of the face, arm or leg; trouble walking; dizziness; loss of balance or coordination stomach pain sweating swelling of legs or ankles vomiting weight gain Side effects that usually do not require medical attention (report to your doctor or health care professional if they continue or are bothersome): back pain changes in taste decreased appetite dry  skin nausea tiredness This list may not describe all possible side effects. Call your doctor for medical advice about side effects. You may report side effects to FDA at 1-800-FDA-1088. Where should I keep my medication? This drug is given in a hospital or clinic and will not be stored at home. NOTE: This sheet is a summary. It may not cover all possible information. If you have questions about this medicine, talk to your doctor, pharmacist, or health care provider.  2022 Elsevier/Gold Standard (2021-06-24 00:00:00)

## 2021-09-25 NOTE — Progress Notes (Signed)
Pt received zirabev infusion in clinic today. Tolerated well. No complaints at d/c.

## 2021-09-26 LAB — CEA: CEA: 105 ng/mL — ABNORMAL HIGH (ref 0.0–4.7)

## 2021-10-03 ENCOUNTER — Inpatient Hospital Stay (HOSPITAL_BASED_OUTPATIENT_CLINIC_OR_DEPARTMENT_OTHER): Payer: Medicare HMO | Admitting: Hospice and Palliative Medicine

## 2021-10-03 ENCOUNTER — Other Ambulatory Visit: Payer: Self-pay

## 2021-10-03 DIAGNOSIS — Z515 Encounter for palliative care: Secondary | ICD-10-CM | POA: Diagnosis not present

## 2021-10-03 DIAGNOSIS — C182 Malignant neoplasm of ascending colon: Secondary | ICD-10-CM | POA: Diagnosis not present

## 2021-10-03 NOTE — Progress Notes (Signed)
Virtual Visit via Telephone Note  I connected with Oscar [name] on 10/03/21 at 10:30 AM EST by telephone and verified that I am speaking with the correct person using two identifiers.  Location: Patient: Home Provider: Clinic   I discussed the limitations, risks, security and privacy concerns of performing an evaluation and management service by telephone and the availability of in person appointments. I also discussed with the patient that there may be a patient responsible charge related to this service. The patient expressed understanding and agreed to proceed.   History of Present Illness: [name] is a 80 y.o. male with multiple medical problems including stage IV colon cancer s/p FOLFIRI plus Avastin chemotherapy now on oral TAS-102.  Patient underwent embolization to liver lesions on 01/15/2021.  He is status post Y 90 to liver lesions on 04/04/2021.Patient was referred to palliative care to help address goals and manage ongoing symptoms.   Observations/Objective: I spoke with patient by phone.    Patient reports that he is doing well without significant changes or concerns.  He denies any significant symptomatic complaints at present.  He does have chronic fatigue but is still quite functional.  He reports he is going bowling this afternoon.  Appetite is reportedly good.  No issues with medications nor need for refills.  Assessment and Plan: Metastatic colon cancer -status post liver embolization/Y 90.  Symptomatically appears to be doing well.  MRI suggestive of disease progression.  We will follow  Neoplasm related pain -continue oxycodone as needed  Follow Up Instructions: Follow-up MyChart visit 1 to 2 months   I discussed the assessment and treatment plan with the patient. The patient was provided an opportunity to ask questions and all were answered. The patient agreed with the plan and demonstrated an understanding of the instructions.   The patient was advised  to call back or seek an in-person evaluation if the symptoms worsen or if the condition fails to improve as anticipated.  I provided 5 minutes of non-face-to-face time during this encounter.   [name], NP

## 2021-10-06 ENCOUNTER — Ambulatory Visit
Admission: RE | Admit: 2021-10-06 | Discharge: 2021-10-06 | Disposition: A | Discharge status: Home / Self Care | Payer: Medicare HMO | Source: Ambulatory Visit | Attending: Internal Medicine | Admitting: Internal Medicine

## 2021-10-06 ENCOUNTER — Other Ambulatory Visit: Payer: Self-pay

## 2021-10-06 DIAGNOSIS — K7689 Other specified diseases of liver: Secondary | ICD-10-CM | POA: Diagnosis not present

## 2021-10-06 DIAGNOSIS — N2881 Hypertrophy of kidney: Secondary | ICD-10-CM | POA: Diagnosis not present

## 2021-10-06 DIAGNOSIS — C787 Secondary malignant neoplasm of liver and intrahepatic bile duct: Secondary | ICD-10-CM | POA: Diagnosis not present

## 2021-10-06 DIAGNOSIS — C182 Malignant neoplasm of ascending colon: Secondary | ICD-10-CM | POA: Diagnosis not present

## 2021-10-06 DIAGNOSIS — I2510 Atherosclerotic heart disease of native coronary artery without angina pectoris: Secondary | ICD-10-CM | POA: Diagnosis not present

## 2021-10-06 DIAGNOSIS — C189 Malignant neoplasm of colon, unspecified: Secondary | ICD-10-CM | POA: Diagnosis not present

## 2021-10-06 DIAGNOSIS — C7800 Secondary malignant neoplasm of unspecified lung: Secondary | ICD-10-CM | POA: Diagnosis not present

## 2021-10-06 DIAGNOSIS — I700 Atherosclerosis of aorta: Secondary | ICD-10-CM | POA: Diagnosis not present

## 2021-10-06 IMAGING — CT CT CHEST-ABD-PELV W/ CM
2 of 5 series · 11 of 36 positions shown, 12 images · IV contrast (omnipaque)
Comparison: MR abdomen, [DATE], CT abdomen pelvis, [DATE],
CT chest abdomen pelvis, [DATE]

CLINICAL DATA: Metastatic colon cancer restaging, liver and lung
metastases, status post liver ablation and radioembolization.

EXAM:
CT CHEST, ABDOMEN, AND PELVIS WITH CONTRAST
TECHNIQUE: Multidetector CT imaging of the chest, abdomen and pelvis was
performed following the standard protocol during bolus
administration of intravenous contrast.
CONTRAST:  85mL OMNIPAQUE IOHEXOL 300 MG/ML SOLN, additional oral
enteric contrast

[Series 2: axials cap 5.00 · axial · 0.75mm/px · z∈[-1499,-919]mm · 8 of 143 slices shown, 9 images]
[im 15/143  mediastinal]
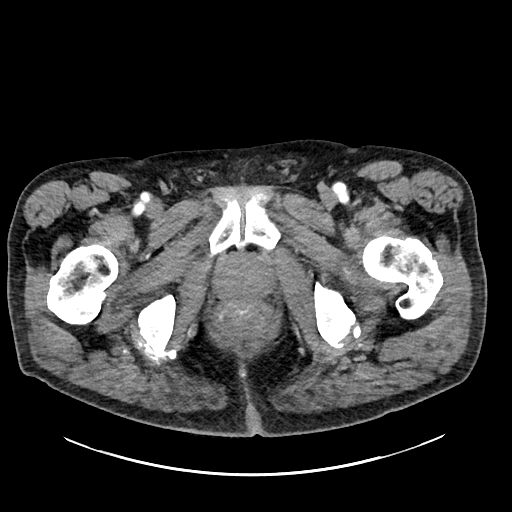
[im 15/143  bone]
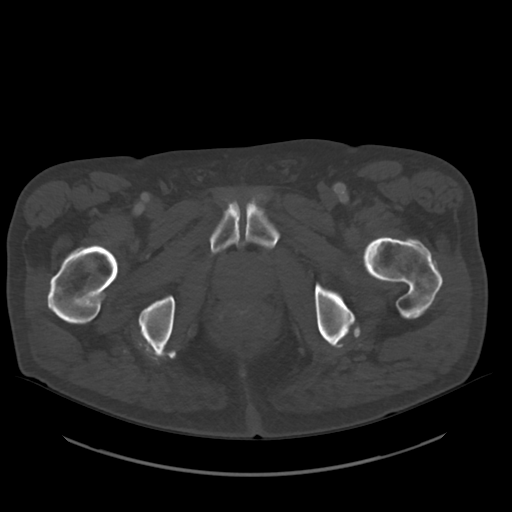
[im 29/143  mediastinal]
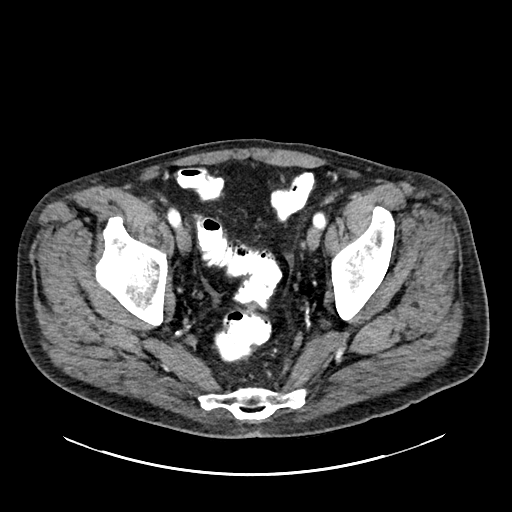
[im 43/143  mediastinal]
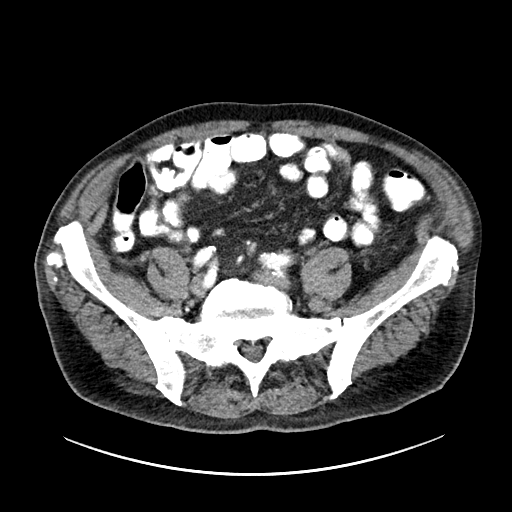
[im 57/143  mediastinal]
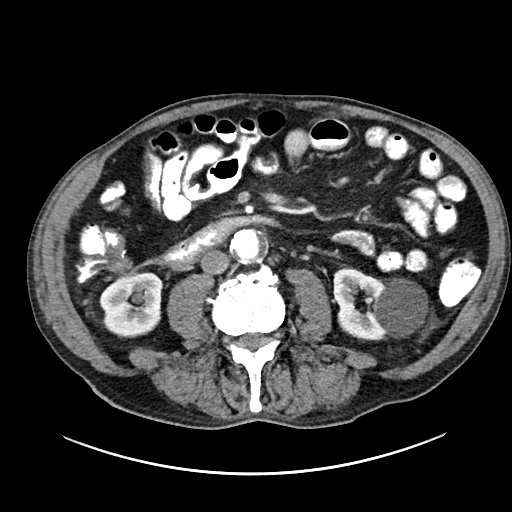
[im 86/143  mediastinal]
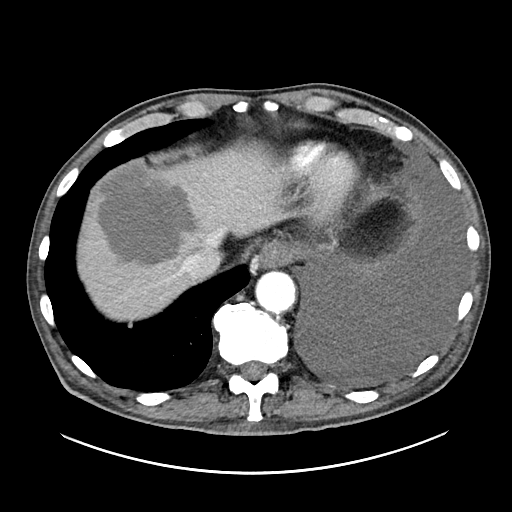
[im 100/143  mediastinal]
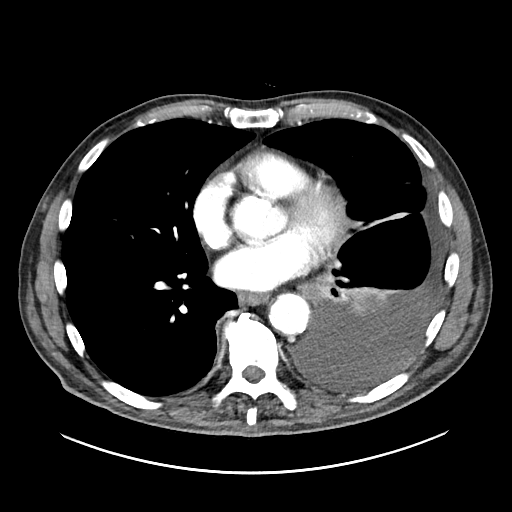
[im 114/143  mediastinal]
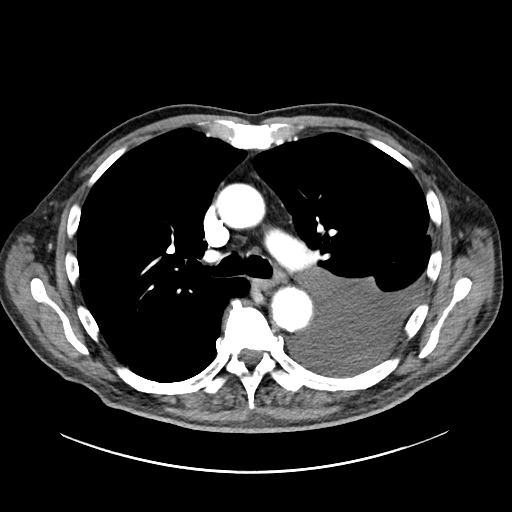
[im 128/143  mediastinal]
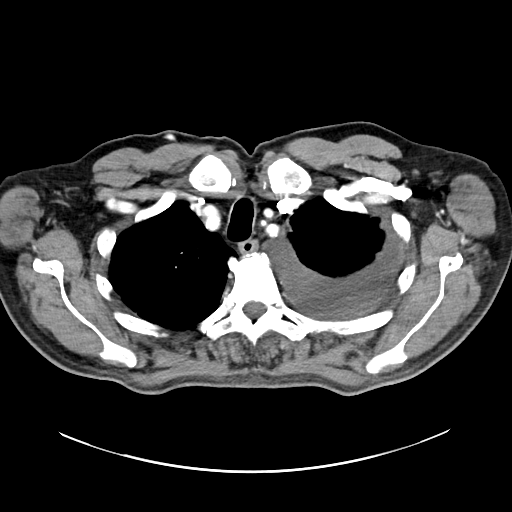

[Series 4: coronals cap 2.00 cor · coronal · 0.75mm/px · 3 of 169 slices shown]
[im 34/169  mediastinal]
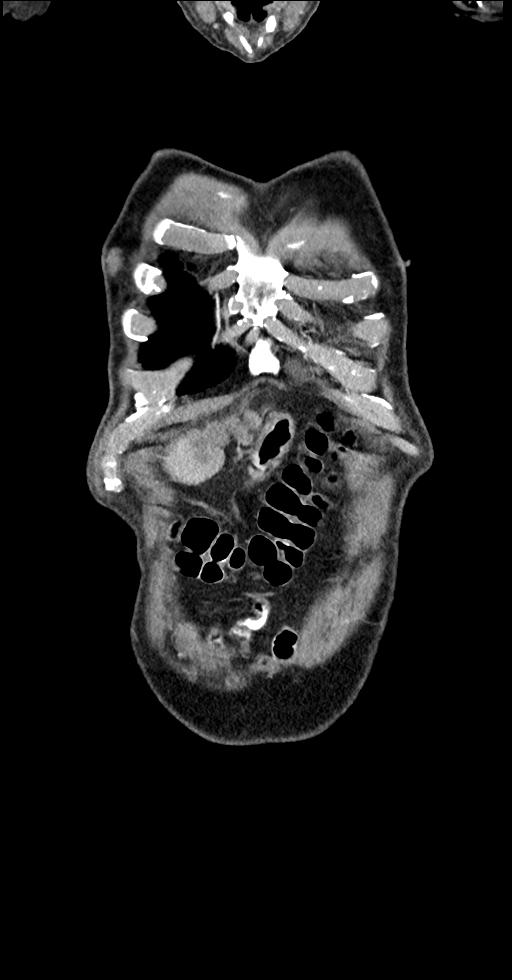
[im 68/169  mediastinal]
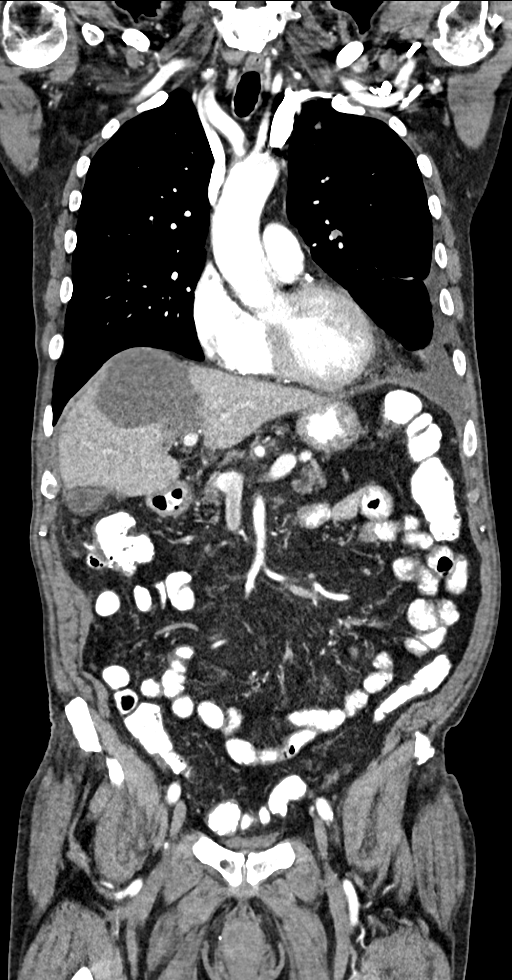
[im 101/169  mediastinal]
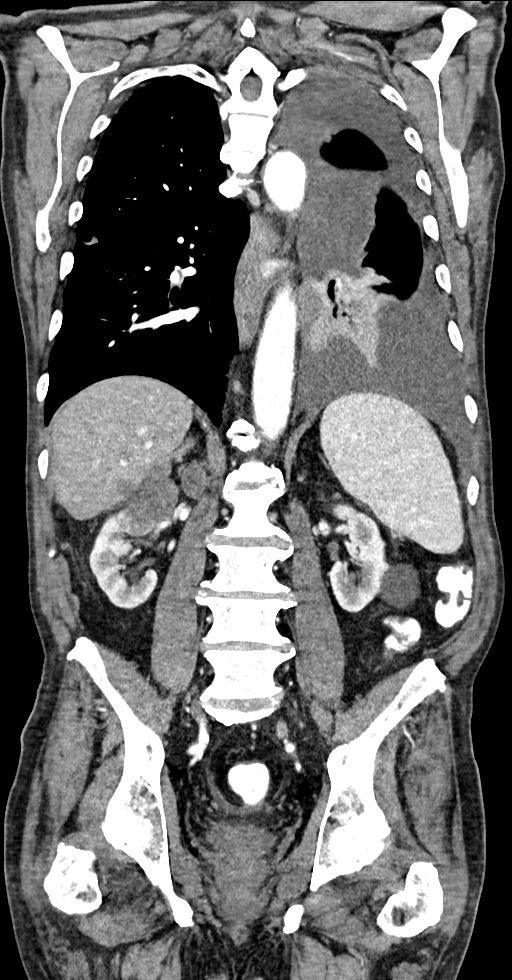

[11 of 36 positions shown; findings below may reference images not displayed]

FINDINGS: CT CHEST FINDINGS

Cardiovascular: Right chest port catheter. Aortic atherosclerosis.
Normal heart size. Three-vessel coronary artery calcifications. No
pericardial effusion.

Mediastinum/Nodes: In comparison to most recent prior dedicated
imaging of the chest dated [DATE], there are new enlarged left
and right hilar lymph nodes, largest left hilar node measuring 1.8 x
1.5 cm (series 2, image 36). Thyroid gland, trachea, and esophagus
demonstrate no significant findings.

Lungs/Pleura: In comparison to most recent prior dedicated imaging
of the chest dated [DATE], there are multiple new and enlarged
bilateral pulmonary nodules, for example a nodule of the anterior
left upper lobe measuring 1.2 x 1.1 cm, previously no greater than
0.4 cm (series 3, image 83) and a new nodule of the right lung base
abutting the right hemidiaphragm measuring 0.8 x 0.8 cm (series 3,
image 145). There is a moderate left pleural effusion and associated
atelectasis or consolidation, as seen on prior MR dated [DATE],
with pleural soft tissue nodules about the dependent left lung base,
slightly enlarged compared to prior examination, the larger of two
measuring 6.5 x 3.0 cm, previously 5.7 x 2.6 cm (series 2, image
68).

Musculoskeletal: No chest wall mass or suspicious bone lesions
identified.

CT ABDOMEN PELVIS FINDINGS

Hepatobiliary: In comparison to prior MR dated [DATE], there is
an unchanged ablation lesion of the anterior liver dome measuring
8.0 x 7.1 cm (series 2, image 58). Unchanged treated metastasis of
the anterior left lobe of the liver measuring 5.8 x 5.6 cm (series
2, image 64). Unchanged treated metastasis of the inferior right
lobe of the liver, hepatic segment VI, measuring 2.3 x 2.1 cm
(series 2, image 73). No evidence of residual contrast enhancement
on this single phase examination. There is a new hypodense lesion of
the peripheral liver dome, hepatic segment VII, measuring 0.9 x
cm (series 2, image 62). No gallstones, gallbladder wall thickening,
or biliary dilatation.

Pancreas: Unremarkable. No pancreatic ductal dilatation or
surrounding inflammatory changes.

Spleen: Normal in size without significant abnormality.

Adrenals/Urinary Tract: Adrenal glands are unremarkable. Interval
enlargement of a soft tissue implant involving the anterior superior
pole of the right kidney and adjacent adrenal gland, largest
component measuring 3.5 x 2.6 cm, previously 2.6 x 1.9 cm on MR
dated [DATE] (series 2, image 75). Small nonobstructive calculus
of the lateral midportion of the left kidney. No right-sided
calculi, ureteral calculi, or hydronephrosis. Bladder is
unremarkable.

Stomach/Bowel: Stomach is within normal limits. Status post partial
right hemicolectomy. Slight interval enlargement of a spiculated
soft tissue mass in the right lower quadrant near the ileocecal
anastomosis, measuring 3.4 x 3.0 cm, previously 3.2 x 2.8 cm (series
2, image 91).

Vascular/Lymphatic: Aortic atherosclerosis. Interval enlargement of
portacaval and right retroperitoneal lymph nodes, largest portacaval
node measuring 3.0 x 2.8 cm, previously 2.0 x 1.6 cm (series 2,
image 69).

Reproductive: No mass or other abnormality.

Other: No abdominal wall hernia or abnormality. No abdominopelvic
ascites.

Musculoskeletal: No acute or significant osseous findings.
IMPRESSION: 1. In comparison to most recent prior dedicated imaging of the chest
dated [DATE], there are multiple new and enlarged bilateral
pulmonary nodules.
2. There is a moderate left pleural effusion and associated
atelectasis or consolidation, as seen on prior MR dated [DATE],
with slight enlargement of pleural metastatic nodules.
3. Interval enlargement of a soft tissue implant involving the
anterior superior pole of the right kidney and adjacent adrenal
gland.
4. Interval enlargement of portacaval and right retroperitoneal
lymph nodes.
5. Multiple unchanged liver metastases following ablation and
radioembolization. There is no evidence of residual contrast
enhancement on this single phase CT examination.
6. There is however a new hypodense lesion of the peripheral liver
dome, hepatic segment VII, measuring 0.9 cm and suspicious for a new
hepatic metastasis.
7. Overall constellation of findings is consistent with worsened
pulmonary, pleural, nodal, and soft tissue metastatic disease, both
in comparison to prior CT examination of the chest dated [DATE]
and MR examination of the abdomen dated [DATE].
8. Coronary artery disease.

Aortic Atherosclerosis ([6U]-[6U]).

## 2021-10-06 MED ORDER — IOHEXOL 300 MG/ML  SOLN
85.0000 mL | Freq: Once | INTRAMUSCULAR | Status: AC | PRN
  Administered 2021-10-06: 16:00:00 85 mL via INTRAVENOUS

## 2021-10-14 ENCOUNTER — Telehealth: Payer: Self-pay | Admitting: *Deleted

## 2021-10-14 ENCOUNTER — Other Ambulatory Visit: Payer: Self-pay

## 2021-10-14 ENCOUNTER — Inpatient Hospital Stay (HOSPITAL_BASED_OUTPATIENT_CLINIC_OR_DEPARTMENT_OTHER): Payer: Medicare HMO | Admitting: Oncology

## 2021-10-14 ENCOUNTER — Inpatient Hospital Stay: Payer: Medicare HMO

## 2021-10-14 ENCOUNTER — Inpatient Hospital Stay: Payer: Medicare HMO | Admitting: Internal Medicine

## 2021-10-14 DIAGNOSIS — C182 Malignant neoplasm of ascending colon: Secondary | ICD-10-CM

## 2021-10-14 DIAGNOSIS — U071 COVID-19: Secondary | ICD-10-CM | POA: Diagnosis not present

## 2021-10-14 MED ORDER — NIRMATRELVIR/RITONAVIR (PAXLOVID)TABLET: 3.0000 | ORAL_TABLET | Freq: Two times a day (BID) | ORAL | 0 refills | Status: AC

## 2021-10-14 NOTE — Telephone Encounter (Signed)
Dr. Jacinto Reap, Patient did home COVID test this morning that resulted positive.  He reports having cough, runny nose, and sore throat for 2 days.  No fever or headache.

## 2021-10-14 NOTE — Telephone Encounter (Signed)
Patient called reporting that he has several family members in the house who have COVID right now and is asking if he should reschedule his appointment for today. Of note he sounds like he has nasal congestion also. Please return his call regarding appointment

## 2021-10-14 NOTE — Telephone Encounter (Signed)
FYI in case you need to talk with pt

## 2021-10-14 NOTE — Telephone Encounter (Signed)
Dr. B, when should we get the appts from today r/s?

## 2021-10-14 NOTE — Progress Notes (Signed)
Outpatient Oral COVID Treatment Note  I connected with Oscar [name] on 10/14/2021/3:27 PM by telephone and verified that I am speaking with the correct person using two identifiers.  I discussed the limitations, risks, security, and privacy concerns of performing an evaluation and management service by telephone and the availability of in person appointments. I also discussed with the patient that there may be a patient responsible charge related to this service. The patient expressed understanding and agreed to proceed.  Patient location: Home Provider location: Clinic   Diagnosis: COVID-19 infection  Purpose of visit: Discussion of potential use of Molnupiravir or Paxlovid, a new treatment for mild to moderate COVID-19 viral infection in non-hospitalized patients.   Subjective: Patient is a 80 y.o. male who has been diagnosed with COVID 19 viral infection.  Their symptoms began on 10/12/21 with fever, cough and congestion.    Past Medical History:  Diagnosis Date   Arthritis    Cancer ([place])    colon cancer 02/2019 per pt    Family history of adverse reaction to anesthesia    cousin took all night to wake up from anesthesia   H/O colon cancer, stage IV    Hyperlipemia    Neuromuscular disorder (HCC)    Pre-diabetes     Allergies  Allergen Reactions   Ace Inhibitors Swelling     Current Outpatient Medications:    amLODipine (NORVASC) 5 MG tablet, Take 5 mg by mouth daily. (Patient not taking: Reported on 10/03/2021), Disp: , Rfl:    ondansetron (ZOFRAN) 4 MG tablet, Take 1 tablet (4 mg total) by mouth every 8 (eight) hours as needed for nausea or vomiting. (Patient not taking: Reported on 02/24/2021), Disp: 20 tablet, Rfl: 0   ondansetron (ZOFRAN) 8 MG tablet, Take 1 tablet (8 mg total) by mouth every 8 (eight) hours as needed for nausea or vomiting. (Patient not taking: Reported on 02/24/2021), Disp: 20 tablet, Rfl: 0   oxyCODONE (OXY IR/ROXICODONE) 5 MG immediate release tablet,  Take 1 tablet (5 mg total) by mouth every 12 (twelve) hours as needed for severe pain., Disp: 60 tablet, Rfl: 0   pravastatin (PRAVACHOL) 40 MG tablet, Take 40 mg by mouth daily.  (Patient not taking: Reported on 06/09/2021), Disp: , Rfl:    prochlorperazine (COMPAZINE) 10 MG tablet, Take 1 tablet (10 mg total) by mouth every 6 (six) hours as needed for nausea or vomiting. (Patient not taking: Reported on 01/28/2021), Disp: 60 tablet, Rfl: 0   sucralfate (CARAFATE) 1 g tablet, Take 1 tablet (1 g total) by mouth 4 (four) times daily -  with meals and at bedtime., Disp: 90 tablet, Rfl: 3   tamsulosin (FLOMAX) 0.4 MG CAPS capsule, Take 1 capsule (0.4 mg total) by mouth daily., Disp: 30 capsule, Rfl: 11   trifluridine-tipiracil (LONSURF) 15-6.14 MG tablet, Take 4 tablets (60 mg of trifluridine total) by mouth 2 (two) times daily after a meal. Take with in 1 hr after AM & PM meals on days 1-5, 8-12. Repeat every 28 day. (Patient not taking: Reported on 09/25/2021), Disp: 80 tablet, Rfl: 3  Objective: Patient appears/sounds well.  They are in no apparent distress.  Breathing is non labored.  Mood and behavior are normal.  Laboratory Data:  Recent Results (from the past 2160 hour(s))  CEA     Status: Abnormal   Collection Time: 07/21/21  8:32 AM  Result Value Ref Range   CEA 89.1 (H) 0.0 - 4.7 ng/mL    Comment: (NOTE)  Nonsmokers          <3.9                             Smokers             <5.6 [employer] Electrochemiluminescence Immunoassay (ECLIA) Values obtained with different assay methods or kits cannot be used interchangeably.  Results cannot be interpreted as absolute evidence of the presence or absence of malignant disease. Performed At: [facility] [place], [place] [id number] Rush Farmer MD [id number]   Urinalysis, Complete w Microscopic     Status: Abnormal   Collection Time: 07/21/21  8:32 AM  Result Value Ref Range    Color, Urine AMBER (A) YELLOW    Comment: BIOCHEMICALS MAY BE AFFECTED BY COLOR   APPearance CLEAR (A) CLEAR   Specific Gravity, Urine 1.026 1.005 - 1.030   pH 5.0 5.0 - 8.0   Glucose, UA NEGATIVE NEGATIVE mg/dL   Hgb urine dipstick MODERATE (A) NEGATIVE   Bilirubin Urine NEGATIVE NEGATIVE   Ketones, ur NEGATIVE NEGATIVE mg/dL   Protein, ur 30 (A) NEGATIVE mg/dL   Nitrite NEGATIVE NEGATIVE   Leukocytes,Ua NEGATIVE NEGATIVE   RBC / HPF 0-5 0 - 5 RBC/hpf   WBC, UA 0-5 0 - 5 WBC/hpf   Bacteria, UA NONE SEEN NONE SEEN   Squamous Epithelial / LPF 0-5 0 - 5   Mucus PRESENT    Hyaline Casts, UA PRESENT     Comment: Performed at [facility], [address] [place], [place] [address]  Comprehensive metabolic panel     Status: Abnormal   Collection Time: 07/21/21  8:32 AM  Result Value Ref Range   Sodium 138 135 - 145 mmol/L   Potassium 3.7 3.5 - 5.1 mmol/L   Chloride 109 98 - 111 mmol/L   CO2 25 22 - 32 mmol/L   Glucose, Bld 197 (H) 70 - 99 mg/dL    Comment: Glucose reference range applies only to samples taken after fasting for at least 8 hours.   BUN 14 8 - 23 mg/dL   Creatinine, Ser 0.73 0.61 - 1.24 mg/dL   Calcium 8.1 (L) 8.9 - 10.3 mg/dL   Total Protein 7.3 6.5 - 8.1 g/dL   Albumin 2.8 (L) 3.5 - 5.0 g/dL   AST 37 15 - 41 U/L   ALT 18 0 - 44 U/L   Alkaline Phosphatase 147 (H) 38 - 126 U/L   Total Bilirubin 1.1 0.3 - 1.2 mg/dL   GFR, Estimated >60 >60 mL/min    Comment: (NOTE) Calculated using the CKD-EPI Creatinine Equation (2021)    Anion gap 4 (L) 5 - 15    Comment: Performed at [facility], [address] [place], [place] [address]  CBC with Differential     Status: Abnormal   Collection Time: 07/21/21  8:32 AM  Result Value Ref Range   WBC 1.9 (L) 4.0 - 10.5 K/uL   RBC 3.16 (L) 4.22 - 5.81 MIL/uL   Hemoglobin 10.8 (L) 13.0 - 17.0 g/dL   HCT 32.0 (L) 39.0 - 52.0 %   MCV 101.3 (H) 80.0 - 100.0 fL   MCH 34.2 (H) 26.0 - 34.0 pg   MCHC 33.8 30.0 -  36.0 g/dL   RDW 15.3 11.5 - 15.5 %   Platelets 73 (L) 150 - 400 K/uL    Comment: SPECIMEN CHECKED FOR CLOTS   nRBC 0.0  0.0 - 0.2 %   Neutrophils Relative % 61 %   Neutro Abs 1.2 (L) 1.7 - 7.7 K/uL   Lymphocytes Relative 27 %   Lymphs Abs 0.5 (L) 0.7 - 4.0 K/uL   Monocytes Relative 4 %   Monocytes Absolute 0.1 0.1 - 1.0 K/uL   Eosinophils Relative 6 %   Eosinophils Absolute 0.1 0.0 - 0.5 K/uL   Basophils Relative 1 %   Basophils Absolute 0.0 0.0 - 0.1 K/uL   WBC Morphology DIFF CONFIRMED BY MANUAL    RBC Morphology UNREMARKABLE    Smear Review Normal platelet morphology     Comment: PLATELETS APPEAR DECREASED   Immature Granulocytes 1 %   Abs Immature Granulocytes 0.01 0.00 - 0.07 K/uL    Comment: Performed at [facility], [address] [place], [place] [address]  CEA     Status: Abnormal   Collection Time: 08/04/21  8:54 AM  Result Value Ref Range   CEA 108.0 (H) 0.0 - 4.7 ng/mL    Comment: (NOTE)                             Nonsmokers          <3.9                             Smokers             <5.6 [employer] Electrochemiluminescence Immunoassay (ECLIA) Values obtained with different assay methods or kits cannot be used interchangeably.  Results cannot be interpreted as absolute evidence of the presence or absence of malignant disease. Performed At: [facility] [place], [place] [id number] Rush Farmer MD [id number]   Urinalysis, Complete w Microscopic     Status: Abnormal   Collection Time: 08/04/21  8:54 AM  Result Value Ref Range   Color, Urine AMBER (A) YELLOW    Comment: BIOCHEMICALS MAY BE AFFECTED BY COLOR   APPearance HAZY (A) CLEAR   Specific Gravity, Urine 1.027 1.005 - 1.030   pH 5.0 5.0 - 8.0   Glucose, UA NEGATIVE NEGATIVE mg/dL   Hgb urine dipstick NEGATIVE NEGATIVE   Bilirubin Urine NEGATIVE NEGATIVE   Ketones, ur 5 (A) NEGATIVE mg/dL   Protein, ur 30 (A) NEGATIVE mg/dL   Nitrite NEGATIVE NEGATIVE    Leukocytes,Ua NEGATIVE NEGATIVE   RBC / HPF 0-5 0 - 5 RBC/hpf   WBC, UA 0-5 0 - 5 WBC/hpf   Bacteria, UA NONE SEEN NONE SEEN   Squamous Epithelial / LPF 0-5 0 - 5   Mucus PRESENT    Granular Casts, UA PRESENT     Comment: Performed at [facility], [address] [place], [place] [address]  Comprehensive metabolic panel     Status: Abnormal   Collection Time: 08/04/21  8:54 AM  Result Value Ref Range   Sodium 136 135 - 145 mmol/L   Potassium 3.5 3.5 - 5.1 mmol/L   Chloride 106 98 - 111 mmol/L   CO2 24 22 - 32 mmol/L   Glucose, Bld 190 (H) 70 - 99 mg/dL    Comment: Glucose reference range applies only to samples taken after fasting for at least 8 hours.   BUN 11 8 - 23 mg/dL   Creatinine, Ser 0.82 0.61 - 1.24 mg/dL   Calcium 8.1 (L) 8.9 - 10.3 mg/dL   Total Protein 7.4 6.5 -  8.1 g/dL   Albumin 3.0 (L) 3.5 - 5.0 g/dL   AST 35 15 - 41 U/L   ALT 18 0 - 44 U/L   Alkaline Phosphatase 161 (H) 38 - 126 U/L   Total Bilirubin 1.0 0.3 - 1.2 mg/dL   GFR, Estimated >60 >60 mL/min    Comment: (NOTE) Calculated using the CKD-EPI Creatinine Equation (2021)    Anion gap 6 5 - 15    Comment: Performed at [facility], [address] [place], [place] [address]  CBC with Differential     Status: Abnormal   Collection Time: 08/04/21  8:54 AM  Result Value Ref Range   WBC 1.2 (LL) 4.0 - 10.5 K/uL    Comment: REPEATED TO VERIFY THIS CRITICAL RESULT HAS VERIFIED AND BEEN CALLED TO DR Rogue Bussing BY KIM ROOS ON 10 17 2022 AT 0922, AND HAS BEEN READ BACK.     RBC 2.99 (L) 4.22 - 5.81 MIL/uL   Hemoglobin 10.5 (L) 13.0 - 17.0 g/dL   HCT 31.1 (L) 39.0 - 52.0 %   MCV 104.0 (H) 80.0 - 100.0 fL   MCH 35.1 (H) 26.0 - 34.0 pg   MCHC 33.8 30.0 - 36.0 g/dL   RDW 16.7 (H) 11.5 - 15.5 %   Platelets 149 (L) 150 - 400 K/uL   nRBC 0.0 0.0 - 0.2 %   Neutrophils Relative % 30 %   Neutro Abs 0.4 (LL) 1.7 - 7.7 K/uL    Comment: REPEATED TO VERIFY THIS CRITICAL RESULT HAS VERIFIED AND BEEN  CALLED TO DR Rogue Bussing BY KIM ROOS ON 10 17 2022 AT 0922, AND HAS BEEN READ BACK.     Lymphocytes Relative 48 %   Lymphs Abs 0.6 (L) 0.7 - 4.0 K/uL   Monocytes Relative 18 %   Monocytes Absolute 0.2 0.1 - 1.0 K/uL   Eosinophils Relative 3 %   Eosinophils Absolute 0.0 0.0 - 0.5 K/uL   Basophils Relative 1 %   Basophils Absolute 0.0 0.0 - 0.1 K/uL   Immature Granulocytes 0 %   Abs Immature Granulocytes 0.00 0.00 - 0.07 K/uL    Comment: Performed at [facility], [address] [place], [place] [address]  CEA     Status: Abnormal   Collection Time: 08/18/21  8:31 AM  Result Value Ref Range   CEA 108.0 (H) 0.0 - 4.7 ng/mL    Comment: (NOTE)                             Nonsmokers          <3.9                             Smokers             <5.6 [employer] Electrochemiluminescence Immunoassay (ECLIA) Values obtained with different assay methods or kits cannot be used interchangeably.  Results cannot be interpreted as absolute evidence of the presence or absence of malignant disease. Performed At: [facility] [address] [place], [place] [id number] Rush Farmer MD [id number]   Urinalysis, Complete w Microscopic     Status: Abnormal   Collection Time: 08/18/21  8:31 AM  Result Value Ref Range   Color, Urine AMBER (A) YELLOW    Comment: BIOCHEMICALS MAY BE AFFECTED BY COLOR   APPearance HAZY (A) CLEAR   Specific Gravity, Urine 1.027 1.005 -  1.030   pH 5.0 5.0 - 8.0   Glucose, UA NEGATIVE NEGATIVE mg/dL   Hgb urine dipstick SMALL (A) NEGATIVE   Bilirubin Urine SMALL (A) NEGATIVE   Ketones, ur 5 (A) NEGATIVE mg/dL   Protein, ur 30 (A) NEGATIVE mg/dL   Nitrite NEGATIVE NEGATIVE   Leukocytes,Ua NEGATIVE NEGATIVE   RBC / HPF 0-5 0 - 5 RBC/hpf   WBC, UA 0-5 0 - 5 WBC/hpf   Bacteria, UA NONE SEEN NONE SEEN   Squamous Epithelial / LPF 0-5 0 - 5   Mucus PRESENT    Hyaline Casts, UA PRESENT    Cellular Cast, UA PRESENT     Comment: Performed at  [facility], [address] [place], [place] [address]  Comprehensive metabolic panel     Status: Abnormal   Collection Time: 08/18/21  8:31 AM  Result Value Ref Range   Sodium 135 135 - 145 mmol/L   Potassium 3.8 3.5 - 5.1 mmol/L   Chloride 103 98 - 111 mmol/L   CO2 25 22 - 32 mmol/L   Glucose, Bld 217 (H) 70 - 99 mg/dL    Comment: Glucose reference range applies only to samples taken after fasting for at least 8 hours.   BUN 12 8 - 23 mg/dL   Creatinine, Ser 0.88 0.61 - 1.24 mg/dL   Calcium 8.1 (L) 8.9 - 10.3 mg/dL   Total Protein 7.4 6.5 - 8.1 g/dL   Albumin 2.9 (L) 3.5 - 5.0 g/dL   AST 41 15 - 41 U/L   ALT 21 0 - 44 U/L   Alkaline Phosphatase 167 (H) 38 - 126 U/L   Total Bilirubin 0.7 0.3 - 1.2 mg/dL   GFR, Estimated >60 >60 mL/min    Comment: (NOTE) Calculated using the CKD-EPI Creatinine Equation (2021)    Anion gap 7 5 - 15    Comment: Performed at [facility], [address] [place], [place] [address]  CBC with Differential     Status: Abnormal   Collection Time: 08/18/21  8:31 AM  Result Value Ref Range   WBC 3.4 (L) 4.0 - 10.5 K/uL   RBC 3.16 (L) 4.22 - 5.81 MIL/uL   Hemoglobin 11.3 (L) 13.0 - 17.0 g/dL   HCT 33.5 (L) 39.0 - 52.0 %   MCV 106.0 (H) 80.0 - 100.0 fL   MCH 35.8 (H) 26.0 - 34.0 pg   MCHC 33.7 30.0 - 36.0 g/dL   RDW 16.5 (H) 11.5 - 15.5 %   Platelets 115 (L) 150 - 400 K/uL   nRBC 0.0 0.0 - 0.2 %   Neutrophils Relative % 63 %   Neutro Abs 2.1 1.7 - 7.7 K/uL   Lymphocytes Relative 20 %   Lymphs Abs 0.7 0.7 - 4.0 K/uL   Monocytes Relative 11 %   Monocytes Absolute 0.4 0.1 - 1.0 K/uL   Eosinophils Relative 5 %   Eosinophils Absolute 0.2 0.0 - 0.5 K/uL   Basophils Relative 1 %   Basophils Absolute 0.0 0.0 - 0.1 K/uL   Immature Granulocytes 0 %   Abs Immature Granulocytes 0.01 0.00 - 0.07 K/uL    Comment: Performed at [facility], [address] [place], [place] [address]  CEA     Status: Abnormal   Collection Time:  09/01/21  8:47 AM  Result Value Ref Range   CEA 107.0 (H) 0.0 - 4.7 ng/mL    Comment: (NOTE)  Nonsmokers          <3.9                             Smokers             <5.6 [employer] Electrochemiluminescence Immunoassay (ECLIA) Values obtained with different assay methods or kits cannot be used interchangeably.  Results cannot be interpreted as absolute evidence of the presence or absence of malignant disease. Performed At: [facility] [place], [place] [id number] Rush Farmer MD [id number]   Urinalysis, Complete w Microscopic     Status: Abnormal   Collection Time: 09/01/21  8:47 AM  Result Value Ref Range   Color, Urine AMBER (A) YELLOW    Comment: BIOCHEMICALS MAY BE AFFECTED BY COLOR   APPearance HAZY (A) CLEAR   Specific Gravity, Urine 1.029 1.005 - 1.030   pH 5.0 5.0 - 8.0   Glucose, UA NEGATIVE NEGATIVE mg/dL   Hgb urine dipstick MODERATE (A) NEGATIVE   Bilirubin Urine NEGATIVE NEGATIVE   Ketones, ur 5 (A) NEGATIVE mg/dL   Protein, ur 30 (A) NEGATIVE mg/dL   Nitrite NEGATIVE NEGATIVE   Leukocytes,Ua NEGATIVE NEGATIVE   RBC / HPF 0-5 0 - 5 RBC/hpf   WBC, UA 0-5 0 - 5 WBC/hpf   Bacteria, UA RARE (A) NONE SEEN   Squamous Epithelial / LPF 0-5 0 - 5   Mucus PRESENT    Hyaline Casts, UA PRESENT     Comment: Performed at [facility], [address] [place], [place] [address]  Comprehensive metabolic panel     Status: Abnormal   Collection Time: 09/01/21  8:47 AM  Result Value Ref Range   Sodium 136 135 - 145 mmol/L   Potassium 3.7 3.5 - 5.1 mmol/L   Chloride 104 98 - 111 mmol/L   CO2 26 22 - 32 mmol/L   Glucose, Bld 204 (H) 70 - 99 mg/dL    Comment: Glucose reference range applies only to samples taken after fasting for at least 8 hours.   BUN 14 8 - 23 mg/dL   Creatinine, Ser 0.81 0.61 - 1.24 mg/dL   Calcium 8.2 (L) 8.9 - 10.3 mg/dL   Total Protein 7.6 6.5 - 8.1 g/dL   Albumin 3.0 (L) 3.5 -  5.0 g/dL   AST 37 15 - 41 U/L   ALT 17 0 - 44 U/L   Alkaline Phosphatase 149 (H) 38 - 126 U/L   Total Bilirubin 0.8 0.3 - 1.2 mg/dL   GFR, Estimated >60 >60 mL/min    Comment: (NOTE) Calculated using the CKD-EPI Creatinine Equation (2021)    Anion gap 6 5 - 15    Comment: Performed at [facility], [address] [place], [place] [address]  CBC with Differential     Status: Abnormal   Collection Time: 09/01/21  8:47 AM  Result Value Ref Range   WBC 2.0 (L) 4.0 - 10.5 K/uL   RBC 2.95 (L) 4.22 - 5.81 MIL/uL   Hemoglobin 10.6 (L) 13.0 - 17.0 g/dL   HCT 30.9 (L) 39.0 - 52.0 %   MCV 104.7 (H) 80.0 - 100.0 fL   MCH 35.9 (H) 26.0 - 34.0 pg   MCHC 34.3 30.0 - 36.0 g/dL   RDW 14.8 11.5 - 15.5 %   Platelets 72 (L) 150 - 400 K/uL   nRBC 0.0 0.0 - 0.2 %   Neutrophils Relative %  56 %   Neutro Abs 1.1 (L) 1.7 - 7.7 K/uL   Lymphocytes Relative 29 %   Lymphs Abs 0.6 (L) 0.7 - 4.0 K/uL   Monocytes Relative 5 %   Monocytes Absolute 0.1 0.1 - 1.0 K/uL   Eosinophils Relative 9 %   Eosinophils Absolute 0.2 0.0 - 0.5 K/uL   Basophils Relative 1 %   Basophils Absolute 0.0 0.0 - 0.1 K/uL   Immature Granulocytes 0 %   Abs Immature Granulocytes 0.00 0.00 - 0.07 K/uL    Comment: Performed at [facility], [address] [place], [place] [address]  CEA     Status: Abnormal   Collection Time: 09/25/21  8:29 AM  Result Value Ref Range   CEA 105.0 (H) 0.0 - 4.7 ng/mL    Comment: (NOTE)                             Nonsmokers          <3.9                             Smokers             <5.6 [employer] Electrochemiluminescence Immunoassay (ECLIA) Values obtained with different assay methods or kits cannot be used interchangeably.  Results cannot be interpreted as absolute evidence of the presence or absence of malignant disease. Performed At: [facility] [place], [place] [id number] Rush Farmer MD [id number]   Urinalysis, Complete w Microscopic      Status: Abnormal   Collection Time: 09/25/21  8:29 AM  Result Value Ref Range   Color, Urine AMBER (A) YELLOW    Comment: AMBER BIOCHEMICALS MAY BE AFFECTED BY COLOR    APPearance CLEAR CLEAR    Comment: CLEAR   Specific Gravity, Urine >1.030 (H) 1.005 - 1.030   pH 5.5 5.0 - 8.0   Glucose, UA NEGATIVE NEGATIVE mg/dL   Hgb urine dipstick SMALL (A) NEGATIVE   Bilirubin Urine MODERATE (A) NEGATIVE   Ketones, ur 15 (A) NEGATIVE mg/dL   Protein, ur 30 (A) NEGATIVE mg/dL   Nitrite POSITIVE (A) NEGATIVE   Leukocytes,Ua NEGATIVE NEGATIVE   Squamous Epithelial / LPF 0-5 0 - 5   WBC, UA 0-5 0 - 5 WBC/hpf   RBC / HPF 0-5 0 - 5 RBC/hpf   Bacteria, UA NONE SEEN NONE SEEN   Mucus PRESENT    Hyaline Casts, UA PRESENT     Comment: Performed at [facility], [address] [place], [place] [address]  Comprehensive metabolic panel     Status: Abnormal   Collection Time: 09/25/21  8:29 AM  Result Value Ref Range   Sodium 136 135 - 145 mmol/L   Potassium 3.5 3.5 - 5.1 mmol/L   Chloride 102 98 - 111 mmol/L   CO2 24 22 - 32 mmol/L   Glucose, Bld 188 (H) 70 - 99 mg/dL    Comment: Glucose reference range applies only to samples taken after fasting for at least 8 hours.   BUN 10 8 - 23 mg/dL   Creatinine, Ser 0.86 0.61 - 1.24 mg/dL   Calcium 8.2 (L) 8.9 - 10.3 mg/dL   Total Protein 7.5 6.5 - 8.1 g/dL   Albumin 2.7 (L) 3.5 - 5.0 g/dL   AST 34 15 - 41 U/L   ALT 16 0 - 44 U/L  Alkaline Phosphatase 165 (H) 38 - 126 U/L   Total Bilirubin 0.8 0.3 - 1.2 mg/dL   GFR, Estimated >60 >60 mL/min    Comment: (NOTE) Calculated using the CKD-EPI Creatinine Equation (2021)    Anion gap 10 5 - 15    Comment: Performed at [facility], [address] [place], [place] [address]  CBC with Differential     Status: Abnormal   Collection Time: 09/25/21  8:29 AM  Result Value Ref Range   WBC 3.8 (L) 4.0 - 10.5 K/uL   RBC 2.93 (L) 4.22 - 5.81 MIL/uL   Hemoglobin 10.6 (L) 13.0 - 17.0 g/dL    HCT 31.5 (L) 39.0 - 52.0 %   MCV 107.5 (H) 80.0 - 100.0 fL   MCH 36.2 (H) 26.0 - 34.0 pg   MCHC 33.7 30.0 - 36.0 g/dL   RDW 16.7 (H) 11.5 - 15.5 %   Platelets 123 (L) 150 - 400 K/uL   nRBC 0.0 0.0 - 0.2 %   Neutrophils Relative % 67 %   Neutro Abs 2.6 1.7 - 7.7 K/uL   Lymphocytes Relative 20 %   Lymphs Abs 0.7 0.7 - 4.0 K/uL   Monocytes Relative 10 %   Monocytes Absolute 0.4 0.1 - 1.0 K/uL   Eosinophils Relative 2 %   Eosinophils Absolute 0.1 0.0 - 0.5 K/uL   Basophils Relative 1 %   Basophils Absolute 0.0 0.0 - 0.1 K/uL   Immature Granulocytes 0 %   Abs Immature Granulocytes 0.01 0.00 - 0.07 K/uL    Comment: Performed at [facility], [address] [place], [place] [address]     Assessment: 80 y.o. male with mild/moderate COVID 19 viral infection diagnosed on 10/14/21 at high risk for progression to severe COVID 19.  Plan:  This patient is a 80 y.o. male that meets the following criteria for Emergency Use Authorization of: Paxlovid 1. Age >12 yr AND > 40 kg 2. SARS-COV-2 positive test 3. Symptom onset < 5 days 4. Mild-to-moderate COVID disease with high risk for severe progression to hospitalization or death  I have spoken and communicated the following to the patient or parent/caregiver regarding: Paxlovid is an unapproved drug that is authorized for use under an Emergency Use Authorization.  There are no adequate, approved, available products for the treatment of COVID-19 in adults who have mild-to-moderate COVID-19 and are at high risk for progressing to severe COVID-19, including hospitalization or death. Other therapeutics are currently authorized. For additional information on all products authorized for treatment or prevention of COVID-19, please see [email or web address].  There are benefits and risks of taking this treatment as outlined in the Fact Sheet for  Patients and Caregivers.  Fact Sheet for Patients and Caregivers was reviewed with patient. A hard copy will be provided to patient from pharmacy prior to the patient receiving treatment. Patients should continue to self-isolate and use infection control measures (e.g., wear mask, isolate, social distance, avoid sharing personal items, clean and disinfect high touch surfaces, and frequent handwashing) according to CDC guidelines.  The patient or parent/caregiver has the option to accept or refuse treatment. Patient medication history was reviewed for potential drug interactions:Interaction with home meds: Oxycodone Patients GFR was calculated to be >60, and they were therefore prescribed Normal dose (GFR>60) - nirmatrelvir 150mg  tab (2 tablet) by mouth twice daily AND ritonavir 100mg  tab (1 tablet) by mouth twice daily  After reviewing above information with the patient, the patient agrees to receive Paxlovid.  Follow up instructions:    Take prescription BID x 5 days as directed Reach out to pharmacist for counseling on medication if desired For concerns regarding further COVID symptoms please follow up with your PCP or urgent care For urgent or life-threatening issues, seek care at your local emergency department  The patient was provided an opportunity to ask questions, and all were answered. The patient agreed with the plan and demonstrated an understanding of the instructions.   I provided 15 minutes of non face-to-face telephone visit time during this encounter, and > 50% was spent counseling as documented under my assessment & plan.  Jacquelin Hawking, NP 10/14/2021 /3:27 PM

## 2021-10-14 NOTE — Telephone Encounter (Signed)
Pt and pt's daughter have been contacted and agreed to virtual visit this afternoon with NP. Appointment scheduled.

## 2021-10-15 ENCOUNTER — Encounter: Payer: Self-pay | Admitting: Internal Medicine

## 2021-10-15 NOTE — Telephone Encounter (Signed)
Pt's daughter called and informed that per Dr. Burlene Arnt, pt needs to stop taking cancer pills for 2 weeks. Daughter verbalized understanding. Scheduling message sent to reschedule 12/27 appointments out by 2 weeks, as requested by MD.

## 2021-10-28 ENCOUNTER — Inpatient Hospital Stay: Payer: Medicare HMO

## 2021-10-28 ENCOUNTER — Other Ambulatory Visit: Payer: Self-pay

## 2021-10-28 ENCOUNTER — Inpatient Hospital Stay: Payer: Medicare HMO | Admitting: Pharmacist

## 2021-10-28 ENCOUNTER — Inpatient Hospital Stay: Payer: Medicare HMO | Attending: Internal Medicine | Admitting: Internal Medicine

## 2021-10-28 ENCOUNTER — Other Ambulatory Visit (HOSPITAL_COMMUNITY): Payer: Self-pay

## 2021-10-28 ENCOUNTER — Encounter: Payer: Self-pay | Admitting: Internal Medicine

## 2021-10-28 ENCOUNTER — Telehealth: Payer: Self-pay | Admitting: Pharmacist

## 2021-10-28 DIAGNOSIS — E876 Hypokalemia: Secondary | ICD-10-CM | POA: Diagnosis not present

## 2021-10-28 DIAGNOSIS — Z66 Do not resuscitate: Secondary | ICD-10-CM | POA: Diagnosis not present

## 2021-10-28 DIAGNOSIS — Z79899 Other long term (current) drug therapy: Secondary | ICD-10-CM | POA: Diagnosis not present

## 2021-10-28 DIAGNOSIS — J90 Pleural effusion, not elsewhere classified: Secondary | ICD-10-CM | POA: Insufficient documentation

## 2021-10-28 DIAGNOSIS — R1031 Right lower quadrant pain: Secondary | ICD-10-CM | POA: Diagnosis not present

## 2021-10-28 DIAGNOSIS — E871 Hypo-osmolality and hyponatremia: Secondary | ICD-10-CM | POA: Insufficient documentation

## 2021-10-28 DIAGNOSIS — R1011 Right upper quadrant pain: Secondary | ICD-10-CM | POA: Insufficient documentation

## 2021-10-28 DIAGNOSIS — C7800 Secondary malignant neoplasm of unspecified lung: Secondary | ICD-10-CM | POA: Diagnosis not present

## 2021-10-28 DIAGNOSIS — E8809 Other disorders of plasma-protein metabolism, not elsewhere classified: Secondary | ICD-10-CM | POA: Diagnosis not present

## 2021-10-28 DIAGNOSIS — C787 Secondary malignant neoplasm of liver and intrahepatic bile duct: Secondary | ICD-10-CM | POA: Insufficient documentation

## 2021-10-28 DIAGNOSIS — C182 Malignant neoplasm of ascending colon: Secondary | ICD-10-CM

## 2021-10-28 DIAGNOSIS — F17210 Nicotine dependence, cigarettes, uncomplicated: Secondary | ICD-10-CM | POA: Diagnosis not present

## 2021-10-28 DIAGNOSIS — K769 Liver disease, unspecified: Secondary | ICD-10-CM | POA: Diagnosis not present

## 2021-10-28 DIAGNOSIS — G893 Neoplasm related pain (acute) (chronic): Secondary | ICD-10-CM | POA: Insufficient documentation

## 2021-10-28 LAB — CBC WITH DIFFERENTIAL/PLATELET
Abs Immature Granulocytes: 0 10*3/uL (ref 0.00–0.07)
Basophils Absolute: 0 10*3/uL (ref 0.0–0.1)
Basophils Relative: 1 %
Eosinophils Absolute: 0.1 10*3/uL (ref 0.0–0.5)
Eosinophils Relative: 3 %
HCT: 29.4 % — ABNORMAL LOW (ref 39.0–52.0)
Hemoglobin: 10 g/dL — ABNORMAL LOW (ref 13.0–17.0)
Immature Granulocytes: 0 %
Lymphocytes Relative: 32 %
Lymphs Abs: 0.5 10*3/uL — ABNORMAL LOW (ref 0.7–4.0)
MCH: 37.5 pg — ABNORMAL HIGH (ref 26.0–34.0)
MCHC: 34 g/dL (ref 30.0–36.0)
MCV: 110.1 fL — ABNORMAL HIGH (ref 80.0–100.0)
Monocytes Absolute: 0.3 10*3/uL (ref 0.1–1.0)
Monocytes Relative: 19 %
Neutro Abs: 0.7 10*3/uL — ABNORMAL LOW (ref 1.7–7.7)
Neutrophils Relative %: 45 %
Platelets: 163 10*3/uL (ref 150–400)
RBC: 2.67 MIL/uL — ABNORMAL LOW (ref 4.22–5.81)
RDW: 15.9 % — ABNORMAL HIGH (ref 11.5–15.5)
WBC: 1.6 10*3/uL — ABNORMAL LOW (ref 4.0–10.5)
nRBC: 0 % (ref 0.0–0.2)

## 2021-10-28 LAB — COMPREHENSIVE METABOLIC PANEL
ALT: 17 U/L (ref 0–44)
AST: 35 U/L (ref 15–41)
Albumin: 2.9 g/dL — ABNORMAL LOW (ref 3.5–5.0)
Alkaline Phosphatase: 155 U/L — ABNORMAL HIGH (ref 38–126)
Anion gap: 5 (ref 5–15)
BUN: 11 mg/dL (ref 8–23)
CO2: 24 mmol/L (ref 22–32)
Calcium: 7.9 mg/dL — ABNORMAL LOW (ref 8.9–10.3)
Chloride: 104 mmol/L (ref 98–111)
Creatinine, Ser: 0.84 mg/dL (ref 0.61–1.24)
GFR, Estimated: >60 mL/min (ref >60)
Glucose, Bld: 225 mg/dL — ABNORMAL HIGH (ref 70–99)
Potassium: 3.8 mmol/L (ref 3.5–5.1)
Sodium: 133 mmol/L — ABNORMAL LOW (ref 135–145)
Total Bilirubin: 0.9 mg/dL (ref 0.3–1.2)
Total Protein: 7.5 g/dL (ref 6.5–8.1)

## 2021-10-28 MED ORDER — OXYCODONE HCL 5 MG PO TABS: 5.0000 mg | ORAL_TABLET | Freq: Two times a day (BID) | ORAL | 0 refills | Status: DC | PRN

## 2021-10-28 MED ORDER — REGORAFENIB 40 MG PO TABS
ORAL_TABLET | ORAL | 0 refills | Status: DC
  Filled 2021-10-28: qty 63, fill #0

## 2021-10-28 NOTE — Progress Notes (Signed)
Had covid during Christmas.  Having rt sided flank pain.  Would like results of latest scan.

## 2021-10-28 NOTE — Progress Notes (Signed)
Unable to reach the daughter over the phone. I left a voicemail for her discussing the progress of disease noted on the CT and the plan for new treatment option.  Encouraged to call us back to discuss further. GB

## 2021-10-28 NOTE — Telephone Encounter (Signed)
Oral Oncology Pharmacist Encounter   Received notification from [facility] that prior authorization for [place] is required.   PA submitted on CMM Key [id number] Status is pending   Oral Oncology Clinic will continue to follow.   Darl Pikes, PharmD, BCPS, [facility] Hematology/Oncology Clinical Pharmacist [facility] [place] [facility] [phone]  10/28/2021 10:15 PM

## 2021-10-28 NOTE — Telephone Encounter (Signed)
Oral Oncology Pharmacist Encounter   Prior Authorization for Oscar [name] has been approved.     PA# [id number] Effective dates: 10/28/21 through 04/26/22   Oral Oncology Clinic will continue to follow.   Darl Pikes, PharmD, BCPS. BCOP Hematology/Oncology Clinical Pharmacist [facility]/HP/AP Oral Chemotherapy Navigation Clinic [phone]  10/28/2021 10:32 PM

## 2021-10-28 NOTE — Patient Instructions (Signed)
#  Discontinue/stop taking Lonsurf-chemo pills.

## 2021-10-28 NOTE — Assessment & Plan Note (Addendum)
#  Right-sided colon adenocarcinoma-with synchronous metastasis to liver/unresectabls;  Currently on oral TAS-102 (*35 mg/m2 twice daily on days 1-5 and 8-12 every 28 days]+ BEV q 2 W. CT scan dec 2022-progression of disease noted with increasing lung nodules/left moderate pleural effusion; new liver lesion; soft tissue implant anterior to the right kidney ~ 5cm in size.   #Discontinue current therapy- TAS 102+BEV-given the progression of disease.  #Discussed the unfortunate progression of disease as noted in above scans.  Discussed the option of treating with regorafenib on palliative reasons.  Again understands that the response rates are quite modest.  Also discussed the potential side effects including but not limited to diarrhea fatigue nausea vomiting and hand-foot syndrome.  Recommend dose escalation as part of the Re-DOS study.  Discussed with Ebony Hail.  # Right upper quadrant pain-secondary to malignancy.  Worse--refill oxycodone prn   # UA protein: 100- check Urine protein ratio today; Protein Creatinine Ratio: 0.05- STABLE.   # Hypoalbuminemia/malnutrition: recommend Ensure/proetin intake- STABLE.   # PN- G-1-2; sec to oxaliplatin-   # Mediport- functioning-STABLE>  #Prognosis: Discussed with patient the prognosis unfortunately poor given the progressive disease s/p multiple lines of therapy.  Discussed that life expectancy is in the order of few-many months depending upon response to therapy or no reponse to therapy.  I will reach out to patient's daughter  # DISPOSITION:  # HOLD zirabev today. De-access # follow up in 3 weeks- MD; labs- cbc/cmp CEA/UA; -Dr.B  # I reviewed the blood work- with the patient in detail; also reviewed the imaging independently [as summarized above]; and with the patient in detail.

## 2021-10-28 NOTE — Progress Notes (Signed)
[place]  Telephone:([phone] [phone]  Patient Care Team: Sofie Hartigan, MD as PCP - General (Family Medicine) Cammie Sickle, MD as Consulting Physician (Hematology and Oncology)   Name of the patient: Oscar [name]  [id number]  [birth year]   Date of visit: 10/28/21  HPI: Patient is a 81 y.o. male with progressive metastatic colon adenocarcinoma. Planned treatment with Stivarga (regorafenib).   Reason for Consult: Stivarga (regorafenib) oral chemotherapy education.   PAST MEDICAL HISTORY: Past Medical History:  Diagnosis Date   Arthritis    Cancer ([place])    colon cancer 02/2019 per pt    Family history of adverse reaction to anesthesia    cousin took all night to wake up from anesthesia   H/O colon cancer, stage IV    Hyperlipemia    Neuromuscular disorder [facility])    Pre-diabetes     HEMATOLOGY/ONCOLOGY HISTORY:  Oncology History Overview Note  # MAY 2020- 3. 03/09/19 Liver biopsy. Microscopic examination shows malignant cells with glandular architecture consistent with adenocarcinoma. The malignant cells are positive for CK20 and CDX-2. These findings support the clinical impression of metastatic colon adenocarcinoma. 4. 03/10/19 R hemicolectomy. Tumor site cecum. Adenocarcinoma. Mucinous features present. G2. No tumor deposits. Invades visceral peritoneum. No tumor perforation. LVI present. PNI not identified. All margins uninvolved. 1/12 LNs. PT4apN1. Periappendiceal inflammation c/w resolving abscess. Microsatellite stable (MSS). [Dr.Mettu; [facility]]  # SEP 4th 2020 [compared to May 2020]  Interval increase in size of the metastases to the hepatic dome, The metastasis to the left hepatic lobe is unchanged; 2.  New subcentimeter hypoattenuating lesion in the inferior right hepatic lobe, incompletely characterized on CT. 3.  Postsurgical changes following right hemicolectomy.  # OCT 2020- FOLFOX +avastin; CT dec  22nd 2020- [compared to [facility] sep 9th 2020]-Liver- slight progression versus stable disease; CT scan SEP 4th 2021- Progressive disease-left lower lobe lung nodule 8 mm [previously 4 mm]; increase in size of the hepatic metastases by few millimeters.;  Soft tissue nodule adjacent to anastomotic site again increased by few millimeters.STOP FOLFOX; cont avastin  #  SEP 20th, 2021- FOLFIRI+ AVASTIN; HOLD FEB 2022- sec to poor tolerance [peptic ulcer]; 3/30-liver ablation-   [bland embolization of segment 2/3 liver lesion on 12/19/2020;  microwave ablation of both liver lesions.  # ?July 25th, 2022- BEV _ TAS 102.MAY 2022- s/p Liver ablation  # DEC 2022-progressive disease-lung and liver/abdomen; START Regorefenib.   # FEB /16-EGD [Dr.Anna-duodenal ulcer-? meloxicam]  # JAN 2022- COVID [s/p Mab infusion; pills; skin rash resolved.]  # NGS/F-ONE-MUTATED K-RAS [G]  # PALLIATIVE CARE EVALUATION: 09/20/2019-[name]  # PAIN MANAGEMENT: NA   DIAGNOSIS: COLON CANCER  STAGE:  IV     ;  GOALS:Palliative      Cancer of right colon ([place])  07/05/2019 Initial Diagnosis   Cancer of right colon ([place])   07/24/2019 - 06/25/2020 Chemotherapy   The patient had dexamethasone (DECADRON) 4 MG tablet, 8 mg, Oral, Daily, 1 of 1 cycle, Start date: --, End date: -- palonosetron (ALOXI) injection 0.25 mg, 0.25 mg, Intravenous,  Once, 23 of 25 cycles Administration: 0.25 mg (07/24/2019), 0.25 mg (08/07/2019), 0.25 mg (08/21/2019), 0.25 mg (09/04/2019), 0.25 mg (09/20/2019), 0.25 mg (10/04/2019), 0.25 mg (10/16/2019), 0.25 mg (10/30/2019), 0.25 mg (11/22/2019), 0.25 mg (12/06/2019), 0.25 mg (12/20/2019), 0.25 mg (01/03/2020), 0.25 mg (01/29/2020), 0.25 mg (02/12/2020), 0.25 mg (02/26/2020), 0.25 mg (03/11/2020), 0.25 mg (03/25/2020), 0.25 mg (04/08/2020), 0.25 mg (04/24/2020), 0.25 mg (05/08/2020), 0.25 mg (05/22/2020),  0.25 mg (06/10/2020), 0.25 mg (06/25/2020) leucovorin 800 mg in dextrose 5 % 250 mL infusion, 844 mg, Intravenous,  Once, 23 of 25  cycles Administration: 800 mg (07/24/2019), 800 mg (08/07/2019), 800 mg (08/21/2019), 800 mg (09/04/2019), 800 mg (09/20/2019), 800 mg (10/04/2019), 800 mg (10/16/2019), 800 mg (10/30/2019), 800 mg (11/22/2019), 800 mg (12/06/2019), 800 mg (12/20/2019), 800 mg (01/03/2020), 800 mg (01/29/2020), 800 mg (02/12/2020), 800 mg (02/26/2020), 800 mg (03/11/2020), 800 mg (03/25/2020), 800 mg (04/08/2020), 800 mg (04/24/2020), 800 mg (05/08/2020), 800 mg (05/22/2020), 800 mg (06/10/2020), 800 mg (06/25/2020) oxaliplatin (ELOXATIN) 180 mg in dextrose 5 % 500 mL chemo infusion, 85 mg/m2 = 180 mg, Intravenous,  Once, 23 of 25 cycles Dose modification: 178 mg (original dose 85 mg/m2, Cycle 17, Reason: Other (see comments), Comment: insurance adjusted dose ) Administration: 180 mg (07/24/2019), 180 mg (08/07/2019), 180 mg (08/21/2019), 180 mg (09/04/2019), 180 mg (09/20/2019), 180 mg (10/04/2019), 180 mg (10/16/2019), 180 mg (10/30/2019), 180 mg (11/22/2019), 180 mg (12/06/2019), 180 mg (12/20/2019), 180 mg (01/03/2020), 180 mg (01/29/2020), 180 mg (02/12/2020), 180 mg (02/26/2020), 180 mg (03/11/2020), 180 mg (04/08/2020), 180 mg (04/24/2020), 180 mg (05/08/2020), 180 mg (05/22/2020), 180 mg (06/10/2020), 180 mg (06/25/2020) fluorouracil (ADRUCIL) 5,000 mg in sodium chloride 0.9 % 150 mL chemo infusion, 5,050 mg, Intravenous, 1 Day/Dose, 23 of 25 cycles Administration: 5,000 mg (07/24/2019), 5,000 mg (08/07/2019), 5,000 mg (08/21/2019), 5,050 mg (09/04/2019), 5,000 mg (10/04/2019), 5,000 mg (10/16/2019), 5,000 mg (11/22/2019), 5,000 mg (12/06/2019), 5,000 mg (12/20/2019), 5,000 mg (01/03/2020), 5,000 mg (01/29/2020), 5,000 mg (02/12/2020), 5,000 mg (02/26/2020), 5,000 mg (03/11/2020), 5,000 mg (03/25/2020), 5,000 mg (04/08/2020), 5,000 mg (04/24/2020), 5,000 mg (05/08/2020), 5,000 mg (05/22/2020), 5,000 mg (06/10/2020), 5,000 mg (06/25/2020) bevacizumab-bvzr (ZIRABEV) 400 mg in sodium chloride 0.9 % 100 mL chemo infusion, 5 mg/kg = 400 mg, Intravenous,  Once, 23 of 25 cycles Administration:  400 mg (08/07/2019), 400 mg (08/21/2019), 400 mg (09/04/2019), 400 mg (09/20/2019), 400 mg (10/04/2019), 400 mg (10/16/2019), 400 mg (10/30/2019), 400 mg (11/22/2019), 400 mg (12/06/2019), 400 mg (12/20/2019), 400 mg (01/03/2020), 400 mg (01/29/2020), 400 mg (02/12/2020), 400 mg (02/26/2020), 400 mg (03/11/2020), 400 mg (03/25/2020), 400 mg (04/08/2020), 400 mg (04/24/2020), 400 mg (05/08/2020), 400 mg (05/22/2020), 400 mg (06/10/2020), 400 mg (06/25/2020)   for chemotherapy treatment.     07/08/2020 -  Chemotherapy   Patient is on Treatment Plan : COLORECTAL FOLFIRI / BEVACIZUMAB Q14D     11/05/2020 Cancer Staging   Staging form: Colon and Rectum, AJCC 8th Edition - Clinical: Stage IVC (pM1c) - Signed by Cammie Sickle, MD on 11/05/2020      ALLERGIES:  is allergic to ace inhibitors.  MEDICATIONS:  Current Outpatient Medications  Medication Sig Dispense Refill   amLODipine (NORVASC) 5 MG tablet Take 5 mg by mouth daily. (Patient not taking: Reported on 10/03/2021)     ondansetron (ZOFRAN) 4 MG tablet Take 1 tablet (4 mg total) by mouth every 8 (eight) hours as needed for nausea or vomiting. (Patient not taking: Reported on 02/24/2021) 20 tablet 0   ondansetron (ZOFRAN) 8 MG tablet Take 1 tablet (8 mg total) by mouth every 8 (eight) hours as needed for nausea or vomiting. (Patient not taking: Reported on 02/24/2021) 20 tablet 0   oxyCODONE (OXY IR/ROXICODONE) 5 MG immediate release tablet Take 1 tablet (5 mg total) by mouth every 12 (twelve) hours as needed for severe pain. 60 tablet 0   pravastatin (PRAVACHOL) 40 MG tablet Take 40 mg by mouth daily.  (Patient not  taking: Reported on 06/09/2021)     prochlorperazine (COMPAZINE) 10 MG tablet Take 1 tablet (10 mg total) by mouth every 6 (six) hours as needed for nausea or vomiting. (Patient not taking: Reported on 01/28/2021) 60 tablet 0   sucralfate (CARAFATE) 1 g tablet Take 1 tablet (1 g total) by mouth 4 (four) times daily -  with meals and at bedtime. 90 tablet 3    tamsulosin (FLOMAX) 0.4 MG CAPS capsule Take 1 capsule (0.4 mg total) by mouth daily. 30 capsule 11   trifluridine-tipiracil (LONSURF) 15-6.14 MG tablet Take 4 tablets (60 mg of trifluridine total) by mouth 2 (two) times daily after a meal. Take with in 1 hr after AM & PM meals on days 1-5, 8-12. Repeat every 28 day. (Patient not taking: Reported on 09/25/2021) 80 tablet 3   No current facility-administered medications for this visit.    VITAL SIGNS: There were no vitals taken for this visit. There were no vitals filed for this visit.  Estimated body mass index is 23.68 kg/m as calculated from the following:   Height as of an earlier encounter on 10/28/21: 6' (1.829 m).   Weight as of an earlier encounter on 10/28/21: 79.2 kg (174 lb 9.6 oz).  LABS: CBC:    Component Value Date/Time   WBC 1.6 (L) 10/28/2021 1014   HGB 10.0 (L) 10/28/2021 1014   HGB 13.3 09/18/2013 0424   HCT 29.4 (L) 10/28/2021 1014   HCT 38.1 (L) 09/18/2013 0424   PLT 163 10/28/2021 1014   PLT 307 09/18/2013 0424   MCV 110.1 (H) 10/28/2021 1014   MCV 94 09/18/2013 0424   NEUTROABS 0.7 (L) 10/28/2021 1014   NEUTROABS 17.0 (H) 09/18/2013 0424   LYMPHSABS 0.5 (L) 10/28/2021 1014   LYMPHSABS 0.9 (L) 09/18/2013 0424   MONOABS 0.3 10/28/2021 1014   MONOABS 0.4 09/18/2013 0424   EOSABS 0.1 10/28/2021 1014   EOSABS 0.0 09/18/2013 0424   BASOSABS 0.0 10/28/2021 1014   BASOSABS 0.0 09/18/2013 0424   Comprehensive Metabolic Panel:    Component Value Date/Time   NA 133 (L) 10/28/2021 1014   NA 137 09/18/2013 0424   K 3.8 10/28/2021 1014   K 4.5 09/18/2013 0424   CL 104 10/28/2021 1014   CL 107 09/18/2013 0424   CO2 24 10/28/2021 1014   CO2 28 09/18/2013 0424   BUN 11 10/28/2021 1014   BUN 19 (H) 09/18/2013 0424   CREATININE 0.84 10/28/2021 1014   CREATININE 1.15 09/18/2013 0424   GLUCOSE 225 (H) 10/28/2021 1014   GLUCOSE 196 (H) 09/18/2013 0424   CALCIUM 7.9 (L) 10/28/2021 1014   CALCIUM 9.1 09/18/2013  0424   AST 35 10/28/2021 1014   ALT 17 10/28/2021 1014   ALKPHOS 155 (H) 10/28/2021 1014   BILITOT 0.9 10/28/2021 1014   PROT 7.5 10/28/2021 1014   ALBUMIN 2.9 (L) 10/28/2021 1014     Present during today's visit: Patient only  Start plan: pending insurance approval an medication delivery  CMP from 10/28/20 assessed, no relevant lab abnormalities. BP from 10/28/21 slightly low, continue to monitor. Prescription dose and frequency assessed.   Current medication list in Epic reviewed, no DDIs with regorafenib identified:   Patient Education I spoke with patient for overview of new oral chemotherapy medication: regorafenib   Administration: Counseled patient on administration, dosing, side effects, monitoring, drug-food interactions, safe handling, storage, and disposal. Patient will take 2 tablets (63m) by mouth daily for 7 days, then 3 tablets (1260m  daily for 7 days, then 4 tablets (132m) daily for 7 days, then hold for 7 days.Take with low fat meal (breakfast). - Dosing using the ReDose study escalation  Side Effects: Side effects include but not limited to: diarrhea, hand-foot syndrome, nausea, fatigue, mouth sores, decreased wbc/hgb/plt, HTN.    Adherence: After discussion with patient no patient barriers to medication adherence identified.  Reviewed with patient importance of keeping a medication schedule and plan for any missed doses.  Mr. SStowersvoiced understanding and appreciation. All questions answered. Medication handout provided.  [facility]. Patient knows to call the office with questions or concerns. Oral Chemotherapy Navigation Clinic will continue to follow.  Patient expressed understanding and was in agreement with this plan. He also understands that He can call clinic at any time with any questions, concerns, or complaints.   Medication Access Issues: Pending insurance approval, grant to cover cost  obtained  Follow-up plan: RTC in 3 weeks  Thank you for allowing me to participate in the care of this patient.   Time Total: 20 mins  Visit consisted of counseling and education on dealing with issues of symptom management in the setting of serious and potentially life-threatening illness.Greater than 50%  of this time was spent counseling and coordinating care related to the above assessment and plan.  Signed by: ADarl Pikes PharmD, BCPS, BSalley Slaughter CPP Hematology/Oncology Clinical Pharmacist Practitioner /DB/AP [phone] 10/28/2021 4:09 PM

## 2021-10-28 NOTE — Progress Notes (Signed)
[place] NOTE  Patient Care Team: Sofie Hartigan, MD as PCP - General (Family Medicine) Cammie Sickle, MD as Consulting Physician (Hematology and Oncology)  CHIEF COMPLAINTS/PURPOSE OF CONSULTATION: Colon cancer  #  Oncology History Overview Note  # MAY 2020- 3. 03/09/19 Liver biopsy. Microscopic examination shows malignant cells with glandular architecture consistent with adenocarcinoma. The malignant cells are positive for CK20 and CDX-2. These findings support the clinical impression of metastatic colon adenocarcinoma. 4. 03/10/19 R hemicolectomy. Tumor site cecum. Adenocarcinoma. Mucinous features present. G2. No tumor deposits. Invades visceral peritoneum. No tumor perforation. LVI present. PNI not identified. All margins uninvolved. 1/12 LNs. PT4apN1. Periappendiceal inflammation c/w resolving abscess. Microsatellite stable (MSS). [Dr.Mettu; [facility]]  # SEP 4th 2020 [compared to May 2020]  Interval increase in size of the metastases to the hepatic dome, The metastasis to the left hepatic lobe is unchanged; 2.  New subcentimeter hypoattenuating lesion in the inferior right hepatic lobe, incompletely characterized on CT. 3.  Postsurgical changes following right hemicolectomy.  # OCT 2020- FOLFOX +avastin; CT dec 22nd 2020- [compared to [facility] sep 9th 2020]-Liver- slight progression versus stable disease; CT scan SEP 4th 2021- Progressive disease-left lower lobe lung nodule 8 mm [previously 4 mm]; increase in size of the hepatic metastases by few millimeters.;  Soft tissue nodule adjacent to anastomotic site again increased by few millimeters.STOP FOLFOX; cont avastin  #  SEP 20th, 2021- FOLFIRI+ AVASTIN; HOLD FEB 2022- sec to poor tolerance [peptic ulcer]; 3/30-liver ablation-   [bland embolization of segment 2/3 liver lesion on 12/19/2020;  microwave ablation of both liver lesions.  # ?July 25th, 2022- BEV _ TAS 102.MAY 2022- s/p Liver ablation  # DEC  2022-progressive disease-lung and liver/abdomen; START Regorefenib.   # FEB /16-EGD [Dr.Anna-duodenal ulcer-? meloxicam]  # JAN 2022- COVID [s/p Mab infusion; pills; skin rash resolved.]  # NGS/F-ONE-MUTATED K-RAS [G]  # PALLIATIVE CARE EVALUATION: 09/20/2019-[name]  # PAIN MANAGEMENT: NA   DIAGNOSIS: COLON CANCER  STAGE:  IV     ;  GOALS:Palliative      Cancer of right colon ([place])  07/05/2019 Initial Diagnosis   Cancer of right colon ([place])   07/24/2019 - 06/25/2020 Chemotherapy   The patient had dexamethasone (DECADRON) 4 MG tablet, 8 mg, Oral, Daily, 1 of 1 cycle, Start date: --, End date: -- palonosetron (ALOXI) injection 0.25 mg, 0.25 mg, Intravenous,  Once, 23 of 25 cycles Administration: 0.25 mg (07/24/2019), 0.25 mg (08/07/2019), 0.25 mg (08/21/2019), 0.25 mg (09/04/2019), 0.25 mg (09/20/2019), 0.25 mg (10/04/2019), 0.25 mg (10/16/2019), 0.25 mg (10/30/2019), 0.25 mg (11/22/2019), 0.25 mg (12/06/2019), 0.25 mg (12/20/2019), 0.25 mg (01/03/2020), 0.25 mg (01/29/2020), 0.25 mg (02/12/2020), 0.25 mg (02/26/2020), 0.25 mg (03/11/2020), 0.25 mg (03/25/2020), 0.25 mg (04/08/2020), 0.25 mg (04/24/2020), 0.25 mg (05/08/2020), 0.25 mg (05/22/2020), 0.25 mg (06/10/2020), 0.25 mg (06/25/2020) leucovorin 800 mg in dextrose 5 % 250 mL infusion, 844 mg, Intravenous,  Once, 23 of 25 cycles Administration: 800 mg (07/24/2019), 800 mg (08/07/2019), 800 mg (08/21/2019), 800 mg (09/04/2019), 800 mg (09/20/2019), 800 mg (10/04/2019), 800 mg (10/16/2019), 800 mg (10/30/2019), 800 mg (11/22/2019), 800 mg (12/06/2019), 800 mg (12/20/2019), 800 mg (01/03/2020), 800 mg (01/29/2020), 800 mg (02/12/2020), 800 mg (02/26/2020), 800 mg (03/11/2020), 800 mg (03/25/2020), 800 mg (04/08/2020), 800 mg (04/24/2020), 800 mg (05/08/2020), 800 mg (05/22/2020), 800 mg (06/10/2020), 800 mg (06/25/2020) oxaliplatin (ELOXATIN) 180 mg in dextrose 5 % 500 mL chemo infusion, 85 mg/m2 = 180 mg, Intravenous,  Once, 23 of 25 cycles  Dose modification: 178 mg (original dose 85 mg/m2,  Cycle 17, Reason: Other (see comments), Comment: insurance adjusted dose ) Administration: 180 mg (07/24/2019), 180 mg (08/07/2019), 180 mg (08/21/2019), 180 mg (09/04/2019), 180 mg (09/20/2019), 180 mg (10/04/2019), 180 mg (10/16/2019), 180 mg (10/30/2019), 180 mg (11/22/2019), 180 mg (12/06/2019), 180 mg (12/20/2019), 180 mg (01/03/2020), 180 mg (01/29/2020), 180 mg (02/12/2020), 180 mg (02/26/2020), 180 mg (03/11/2020), 180 mg (04/08/2020), 180 mg (04/24/2020), 180 mg (05/08/2020), 180 mg (05/22/2020), 180 mg (06/10/2020), 180 mg (06/25/2020) fluorouracil (ADRUCIL) 5,000 mg in sodium chloride 0.9 % 150 mL chemo infusion, 5,050 mg, Intravenous, 1 Day/Dose, 23 of 25 cycles Administration: 5,000 mg (07/24/2019), 5,000 mg (08/07/2019), 5,000 mg (08/21/2019), 5,050 mg (09/04/2019), 5,000 mg (10/04/2019), 5,000 mg (10/16/2019), 5,000 mg (11/22/2019), 5,000 mg (12/06/2019), 5,000 mg (12/20/2019), 5,000 mg (01/03/2020), 5,000 mg (01/29/2020), 5,000 mg (02/12/2020), 5,000 mg (02/26/2020), 5,000 mg (03/11/2020), 5,000 mg (03/25/2020), 5,000 mg (04/08/2020), 5,000 mg (04/24/2020), 5,000 mg (05/08/2020), 5,000 mg (05/22/2020), 5,000 mg (06/10/2020), 5,000 mg (06/25/2020) bevacizumab-bvzr (ZIRABEV) 400 mg in sodium chloride 0.9 % 100 mL chemo infusion, 5 mg/kg = 400 mg, Intravenous,  Once, 23 of 25 cycles Administration: 400 mg (08/07/2019), 400 mg (08/21/2019), 400 mg (09/04/2019), 400 mg (09/20/2019), 400 mg (10/04/2019), 400 mg (10/16/2019), 400 mg (10/30/2019), 400 mg (11/22/2019), 400 mg (12/06/2019), 400 mg (12/20/2019), 400 mg (01/03/2020), 400 mg (01/29/2020), 400 mg (02/12/2020), 400 mg (02/26/2020), 400 mg (03/11/2020), 400 mg (03/25/2020), 400 mg (04/08/2020), 400 mg (04/24/2020), 400 mg (05/08/2020), 400 mg (05/22/2020), 400 mg (06/10/2020), 400 mg (06/25/2020)   for chemotherapy treatment.     07/08/2020 -  Chemotherapy   Patient is on Treatment Plan : COLORECTAL FOLFIRI / BEVACIZUMAB Q14D     11/05/2020 Cancer Staging   Staging form: Colon and Rectum, AJCC 8th  Edition - Clinical: Stage IVC (pM1c) - Signed by Cammie Sickle, MD on 11/05/2020      HISTORY OF PRESENTING ILLNESS: Alone.  Ambulating independently.  Oscar [name] 81 y.o.  male with metastatic colon cancer to the liver- -currently on bevacizumab plus Lonsurf is here for follow-up/review results of the CT scan.  Patient complains of mild to moderate pain in the right upper quadrant/right flank.  He has been taking oxycodone once or twice a day.  No fever no chills.  No nausea or vomiting.  No sores in the mouth.  No diarrhea.  Review of Systems  Constitutional:  Positive for malaise/fatigue. Negative for chills, diaphoresis and fever.  HENT:  Negative for nosebleeds and sore throat.   Eyes:  Negative for double vision.  Respiratory:  Negative for cough, hemoptysis, sputum production, shortness of breath and wheezing.   Cardiovascular:  Negative for chest pain, palpitations, orthopnea and leg swelling.  Gastrointestinal:  Negative for blood in stool, constipation, heartburn, melena, nausea and vomiting.  Genitourinary:  Negative for dysuria, frequency and urgency.  Musculoskeletal:  Positive for back pain and joint pain.  Skin: Negative.  Negative for itching and rash.  Neurological:  Positive for tingling. Negative for focal weakness, weakness and headaches.  Endo/Heme/Allergies:  Does not bruise/bleed easily.  Psychiatric/Behavioral:  Negative for depression. The patient is not nervous/anxious and does not have insomnia.     MEDICAL HISTORY:  Past Medical History:  Diagnosis Date   Arthritis    Cancer ([place])    colon cancer 02/2019 per pt    Family history of adverse reaction to anesthesia    cousin took all night to wake up from anesthesia  H/O colon cancer, stage IV    Hyperlipemia    Neuromuscular disorder ([place])    Pre-diabetes     SURGICAL HISTORY: Past Surgical History:  Procedure Laterality Date   COLON SURGERY     ESOPHAGOGASTRODUODENOSCOPY  (EGD) WITH PROPOFOL N/A 12/26/2020   Procedure: ESOPHAGOGASTRODUODENOSCOPY (EGD) WITH PROPOFOL;  Surgeon: [name], MD;  Location: [facility] END[facility]COPY;  Service: Gastroenterology;  Laterality: N/A;   IR ANGIOGRAM SELECTIVE EACH ADDITIONAL VESSEL  12/19/2020   IR ANGIOGRAM SELECTIVE EACH ADDITIONAL VESSEL  03/24/2021   IR ANGIOGRAM SELECTIVE EACH ADDITIONAL VESSEL  03/24/2021   IR ANGIOGRAM SELECTIVE EACH ADDITIONAL VESSEL  04/04/2021   IR ANGIOGRAM SELECTIVE EACH ADDITIONAL VESSEL  04/04/2021   IR ANGIOGRAM SELECTIVE EACH ADDITIONAL VESSEL  04/04/2021   IR ANGIOGRAM VISCERAL SELECTIVE  12/19/2020   IR ANGIOGRAM VISCERAL SELECTIVE  03/24/2021   IR ANGIOGRAM VISCERAL SELECTIVE  04/04/2021   IR ANGIOGRAM VISCERAL SELECTIVE  04/04/2021   IR EMBO ARTERIAL NOT HEMORR HEMANG INC GUIDE ROADMAPPING  03/24/2021   IR EMBO TUMOR ORGAN ISCHEMIA INFARCT INC GUIDE ROADMAPPING  12/19/2020   IR EMBO TUMOR ORGAN ISCHEMIA INFARCT INC GUIDE ROADMAPPING  04/04/2021   IR IMAGING GUIDED PORT INSERTION  07/20/2019   IR RADIOLOGIST EVAL & MGMT  12/05/2020   IR RADIOLOGIST EVAL & MGMT  02/11/2021   IR RADIOLOGIST EVAL & MGMT  03/04/2021   IR RADIOLOGIST EVAL & MGMT  04/29/2021   IR RADIOLOGIST EVAL & MGMT  07/22/2021   IR US GUIDE VASC ACCESS RIGHT  12/19/2020   IR US GUIDE VASC ACCESS RIGHT  03/24/2021   IR US GUIDE VASC ACCESS RIGHT  04/04/2021   JOINT REPLACEMENT Left 2010   Knee   RADIOLOGY WITH ANESTHESIA N/A 01/15/2021   Procedure: CT WITH ANESTHESIA MICROWAVE ABLATION OF LIVER;  Surgeon: Criselda Peaches, MD;  Location: [facility] ORS;  Service: Anesthesiology;  Laterality: N/A;    SOCIAL HISTORY: Social History   Socioeconomic History   Marital status: Married    Spouse name: Not on file   Number of children: Not on file   Years of education: Not on file   Highest education level: Not on file  Occupational History   Not on file  Tobacco Use   Smoking status: Every Day    Packs/day: 0.25    Years:  15.00    Pack years: 3.75    Types: Cigarettes   Smokeless tobacco: Never   Tobacco comments:    1 to 2 cigarettes a day occasionally  Vaping Use   Vaping Use: Never used  Substance and Sexual Activity   Alcohol use: Yes    Comment: rarely    Drug use: No   Sexual activity: Not on file  Other Topics Concern   Not on file  Social History Narrative   Recruitment consultant retd; lives in [place]; smoking 3cig/day; [3/4 ppd x started at 9 years]; no alcohol. Son & daughter; wife dementia [waiting for placement].    Social Determinants of Health   Financial Resource Strain: Not on file  Food Insecurity: Not on file  Transportation Needs: Not on file  Physical Activity: Not on file  Stress: Not on file  Social Connections: Not on file  Intimate Partner Violence: Not on file    FAMILY HISTORY: Family History  Problem Relation Age of Onset   Peptic Ulcer Disease Father     ALLERGIES:  is allergic to ace inhibitors.  MEDICATIONS:  Current Outpatient Medications  Medication Sig Dispense  Refill   sucralfate (CARAFATE) 1 g tablet Take 1 tablet (1 g total) by mouth 4 (four) times daily -  with meals and at bedtime. 90 tablet 3   tamsulosin (FLOMAX) 0.4 MG CAPS capsule Take 1 capsule (0.4 mg total) by mouth daily. 30 capsule 11   amLODipine (NORVASC) 5 MG tablet Take 5 mg by mouth daily. (Patient not taking: Reported on 10/03/2021)     ondansetron (ZOFRAN) 4 MG tablet Take 1 tablet (4 mg total) by mouth every 8 (eight) hours as needed for nausea or vomiting. (Patient not taking: Reported on 02/24/2021) 20 tablet 0   ondansetron (ZOFRAN) 8 MG tablet Take 1 tablet (8 mg total) by mouth every 8 (eight) hours as needed for nausea or vomiting. (Patient not taking: Reported on 02/24/2021) 20 tablet 0   oxyCODONE (OXY IR/ROXICODONE) 5 MG immediate release tablet Take 1 tablet (5 mg total) by mouth every 12 (twelve) hours as needed for severe pain. 60 tablet 0    pravastatin (PRAVACHOL) 40 MG tablet Take 40 mg by mouth daily.  (Patient not taking: Reported on 06/09/2021)     prochlorperazine (COMPAZINE) 10 MG tablet Take 1 tablet (10 mg total) by mouth every 6 (six) hours as needed for nausea or vomiting. (Patient not taking: Reported on 01/28/2021) 60 tablet 0   trifluridine-tipiracil (LONSURF) 15-6.14 MG tablet Take 4 tablets (60 mg of trifluridine total) by mouth 2 (two) times daily after a meal. Take with in 1 hr after AM & PM meals on days 1-5, 8-12. Repeat every 28 day. (Patient not taking: Reported on 09/25/2021) 80 tablet 3   No current facility-administered medications for this visit.      Marland Kitchen  PHYSICAL EXAMINATION: ECOG PERFORMANCE STATUS: 0 - Asymptomatic  Vitals:   10/28/21 1046  BP: (!) 109/99  Pulse: 66  Temp: 97.9 F (36.6 C)  SpO2: 100%   Filed Weights   10/28/21 1046  Weight: 174 lb 9.6 oz (79.2 kg)    Physical Exam HENT:     Head: Normocephalic and atraumatic.     Mouth/Throat:     Pharynx: No oropharyngeal exudate.  Eyes:     Pupils: Pupils are equal, round, and reactive to light.  Cardiovascular:     Rate and Rhythm: Normal rate and regular rhythm.  Pulmonary:     Effort: No respiratory distress.     Breath sounds: No wheezing.     Comments: Decreased breath sounds bilaterally.  No wheeze or crackles Abdominal:     General: Bowel sounds are normal. There is no distension.     Palpations: Abdomen is soft. There is no mass.     Tenderness: There is no abdominal tenderness. There is no guarding or rebound.  Musculoskeletal:        General: No tenderness. Normal range of motion.     Cervical back: Normal range of motion and neck supple.  Skin:    General: Skin is warm.  Neurological:     Mental Status: He is alert and oriented to person, place, and time.  Psychiatric:        Mood and Affect: Affect normal.   LABORATORY DATA:  I have reviewed the data as listed Lab Results  Component Value Date   WBC 1.6  (L) 10/28/2021   HGB 10.0 (L) 10/28/2021   HCT 29.4 (L) 10/28/2021   MCV 110.1 (H) 10/28/2021   PLT 163 10/28/2021   Recent Labs    09/01/21 0847 09/25/21 0829 10/28/21 1014  NA 136 136 133*  K 3.7 3.5 3.8  CL 104 102 104  CO2 _0 GLUCOSE 204* 188* 225*  BUN _1 CREATININE 0.81 0.86 0.84  CALCIUM 8.2* 8.2* 7.9*  GFRNONAA >60 >60 >60  PROT 7.6 7.5 7.5  ALBUMIN 3.0* 2.7* 2.9*  AST 37 34 35  ALT _2 ALKPHOS 149* 165* 155*  BILITOT 0.8 0.8 0.9    RADIOGRAPHIC STUDIES: I have personally reviewed the radiological images as listed and agreed with the findings in the report. CT CHEST ABDOMEN PELVIS W CONTRAST  Result Date: 10/07/2021 CLINICAL DATA:  Metastatic colon cancer restaging, liver and lung metastases, status post liver ablation and radioembolization. EXAM: CT CHEST, ABDOMEN, AND PELVIS WITH CONTRAST TECHNIQUE: Multidetector CT imaging of the chest, abdomen and pelvis was performed following the standard protocol during bolus administration of intravenous contrast. CONTRAST:  28m OMNIPAQUE IOHEXOL 300 MG/ML SOLN, additional oral enteric contrast COMPARISON:  MR abdomen, 07/18/2021, CT abdomen pelvis, 11/08/2020, CT chest abdomen pelvis, 09/24/2020 FINDINGS: CT CHEST FINDINGS Cardiovascular: Right chest port catheter. Aortic atherosclerosis. Normal heart size. Three-vessel coronary artery calcifications. No pericardial effusion. Mediastinum/Nodes: In comparison to most recent prior dedicated imaging of the chest dated 09/24/2020, there are new enlarged left and right hilar lymph nodes, largest left hilar node measuring 1.8 x 1.5 cm (series 2, image 36). Thyroid gland, trachea, and esophagus demonstrate no significant findings. Lungs/Pleura: In comparison to most recent prior dedicated imaging of the chest dated 09/24/2020, there are multiple new and enlarged bilateral pulmonary nodules, for example a nodule of the anterior left upper lobe measuring 1.2 x 1.1 cm,  previously no greater than 0.4 cm (series 3, image 83) and a new nodule of the right lung base abutting the right hemidiaphragm measuring 0.8 x 0.8 cm (series 3, image 145). There is a moderate left pleural effusion and associated atelectasis or consolidation, as seen on prior MR dated 07/18/2021, with pleural soft tissue nodules about the dependent left lung base, slightly enlarged compared to prior examination, the larger of two measuring 6.5 x 3.0 cm, previously 5.7 x 2.6 cm (series 2, image 68). Musculoskeletal: No chest wall mass or suspicious bone lesions identified. CT ABDOMEN PELVIS FINDINGS Hepatobiliary: In comparison to prior MR dated 07/18/2021, there is an unchanged ablation lesion of the anterior liver dome measuring 8.0 x 7.1 cm (series 2, image 58). Unchanged treated metastasis of the anterior left lobe of the liver measuring 5.8 x 5.6 cm (series 2, image 64). Unchanged treated metastasis of the inferior right lobe of the liver, hepatic segment VI, measuring 2.3 x 2.1 cm (series 2, image 73). No evidence of residual contrast enhancement on this single phase examination. There is a new hypodense lesion of the peripheral liver dome, hepatic segment VII, measuring 0.9 x 0.8 cm (series 2, image 62). No gallstones, gallbladder wall thickening, or biliary dilatation. Pancreas: Unremarkable. No pancreatic ductal dilatation or surrounding inflammatory changes. Spleen: Normal in size without significant abnormality. Adrenals/Urinary Tract: Adrenal glands are unremarkable. Interval enlargement of a soft tissue implant involving the anterior superior pole of the right kidney and adjacent adrenal gland, largest component measuring 3.5 x 2.6 cm, previously 2.6 x 1.9 cm on MR dated 07/18/2021 (series 2, image 75). Small nonobstructive calculus of the lateral midportion of the left kidney. No right-sided calculi, ureteral calculi, or hydronephrosis. Bladder is unremarkable. Stomach/Bowel: Stomach is within normal  limits. Status post partial right hemicolectomy. Slight interval enlargement of a spiculated  soft tissue mass in the right lower quadrant near the ileocecal anastomosis, measuring 3.4 x 3.0 cm, previously 3.2 x 2.8 cm (series 2, image 91). Vascular/Lymphatic: Aortic atherosclerosis. Interval enlargement of portacaval and right retroperitoneal lymph nodes, largest portacaval node measuring 3.0 x 2.8 cm, previously 2.0 x 1.6 cm (series 2, image 69). Reproductive: No mass or other abnormality. Other: No abdominal wall hernia or abnormality. No abdominopelvic ascites. Musculoskeletal: No acute or significant osseous findings. IMPRESSION: 1. In comparison to most recent prior dedicated imaging of the chest dated 09/24/2020, there are multiple new and enlarged bilateral pulmonary nodules. 2. There is a moderate left pleural effusion and associated atelectasis or consolidation, as seen on prior MR dated 07/18/2021, with slight enlargement of pleural metastatic nodules. 3. Interval enlargement of a soft tissue implant involving the anterior superior pole of the right kidney and adjacent adrenal gland. 4. Interval enlargement of portacaval and right retroperitoneal lymph nodes. 5. Multiple unchanged liver metastases following ablation and radioembolization. There is no evidence of residual contrast enhancement on this single phase CT examination. 6. There is however a new hypodense lesion of the peripheral liver dome, hepatic segment VII, measuring 0.9 cm and suspicious for a new hepatic metastasis. 7. Overall constellation of findings is consistent with worsened pulmonary, pleural, nodal, and soft tissue metastatic disease, both in comparison to prior CT examination of the chest dated 09/24/2020 and MR examination of the abdomen dated 07/18/2021. 8. Coronary artery disease. Aortic Atherosclerosis (ICD10-I70.0). Electronically Signed   By: Delanna Ahmadi M.D.   On: 10/07/2021 15:47     ASSESSMENT & PLAN:   Cancer of  right colon ([place]) #Right-sided colon adenocarcinoma-with synchronous metastasis to liver/unresectabls;  Currently on oral TAS-102 (*35 mg/m2 twice daily on days 1-5 and 8-12 every 28 days]+ BEV q 2 W. CT scan dec 2022-progression of disease noted with increasing lung nodules/left moderate pleural effusion; new liver lesion; soft tissue implant anterior to the right kidney ~ 5cm in size.   #Discontinue current therapy- TAS 102+BEV-given the progression of disease.  #Discussed the unfortunate progression of disease as noted in above scans.  Discussed the option of treating with regorafenib on palliative reasons.  Again understands that the response rates are quite modest.  Also discussed the potential side effects including but not limited to diarrhea fatigue nausea vomiting and hand-foot syndrome.  Recommend dose escalation as part of the Re-DOS study.  Discussed with Ebony Hail.  # Right upper quadrant pain-secondary to malignancy.  Worse--refill oxycodone prn   # UA protein: 100- check Urine protein ratio today; Protein Creatinine Ratio: 0.05- STABLE.   # Hypoalbuminemia/malnutrition: recommend Ensure/proetin intake- STABLE.   # PN- G-1-2; sec to oxaliplatin-   # Mediport- functioning-STABLE>  #Prognosis: Discussed with patient the prognosis unfortunately poor given the progressive disease s/p multiple lines of therapy.  Discussed that life expectancy is in the order of few-many months depending upon response to therapy or no reponse to therapy.  I will reach out to patient's daughter  # DISPOSITION:   # HOLD zirabev today. De-access # follow up in 3 weeks- MD; labs- cbc/cmp CEA/UA; -Dr.B  # I reviewed the blood work- with the patient in detail; also reviewed the imaging independently [as summarized above]; and with the patient in detail.        All questions were answered. The patient knows to call the clinic with any problems, questions or concerns.    Cammie Sickle,  MD 10/28/2021 4:00 PM

## 2021-10-29 ENCOUNTER — Telehealth: Payer: Self-pay | Admitting: Internal Medicine

## 2021-10-29 ENCOUNTER — Other Ambulatory Visit: Payer: Self-pay | Admitting: Interventional Radiology

## 2021-10-29 ENCOUNTER — Other Ambulatory Visit (HOSPITAL_COMMUNITY): Payer: Self-pay

## 2021-10-29 ENCOUNTER — Encounter: Payer: Self-pay | Admitting: Internal Medicine

## 2021-10-29 DIAGNOSIS — C189 Malignant neoplasm of colon, unspecified: Secondary | ICD-10-CM

## 2021-10-29 DIAGNOSIS — C787 Secondary malignant neoplasm of liver and intrahepatic bile duct: Secondary | ICD-10-CM

## 2021-10-29 LAB — CEA: CEA: 119 ng/mL — ABNORMAL HIGH (ref 0.0–4.7)

## 2021-10-29 NOTE — Telephone Encounter (Signed)
I spoke to patient's daughter regarding unfortunate progression of disease; and the fact at best modest response from any further lines of therapy.  Given his performance status of 1- recommend Stivarga.  Discussed that the life expectancy is anywhere between few- many months-depending upon response to therapy/tolerance to therapy.  If further progression of disease is noted/or worsening performance status hospice would be recommended.  GB

## 2021-10-31 ENCOUNTER — Telehealth: Payer: Self-pay | Admitting: Pharmacy Technician

## 2021-10-31 NOTE — Telephone Encounter (Signed)
Oral Oncology Patient Advocate Encounter  Met patient in [place] to complete application for [employer] [place] PAF in an effort to reduce patient's out of pocket expense for Stivarga to $0.    Application completed and faxed to [phone].   [employer] patient assistance phone number for follow up is [phone].   This encounter will be updated until final determination.   [place] Patient [place] Phone [phone] Fax [phone] 10/31/2021 3:10 PM

## 2021-11-03 ENCOUNTER — Other Ambulatory Visit (HOSPITAL_COMMUNITY): Payer: Self-pay

## 2021-11-04 ENCOUNTER — Encounter: Payer: Self-pay | Admitting: Pharmacist

## 2021-11-06 NOTE — Progress Notes (Signed)
Oral Chemotherapy Pharmacist Encounter   Patient stopped by the office on 11/04/21 to pick up samples of Stivarga (regorafenib). He started the regorafenib on that day 11/04/21. Patient was provided with calendar to help him with the dose escalation.   He will take 2 tablets (80mg ) by mouth daily for 7 days, then 3 tablets (120mg ) daily for 7 days, then 4 tablets (160mg ) daily for 7 days, then hold for 7 days.Take with low fat meal (breakfast).   Darl Pikes, PharmD, BCPS, BCOP, CPP Hematology/Oncology Clinical Pharmacist Onward/DB/AP Oral [facility] [phone]  11/06/2021 11:59 AM

## 2021-11-09 ENCOUNTER — Encounter: Payer: Self-pay | Admitting: Internal Medicine

## 2021-11-10 ENCOUNTER — Inpatient Hospital Stay (HOSPITAL_BASED_OUTPATIENT_CLINIC_OR_DEPARTMENT_OTHER): Payer: Medicare HMO | Admitting: Hospice and Palliative Medicine

## 2021-11-10 ENCOUNTER — Other Ambulatory Visit: Payer: Self-pay

## 2021-11-10 ENCOUNTER — Inpatient Hospital Stay: Payer: Medicare HMO

## 2021-11-10 VITALS — BP 128/68 | HR 67 | Temp 98.6°F | Resp 14 | Wt 174.4 lb

## 2021-11-10 DIAGNOSIS — C787 Secondary malignant neoplasm of liver and intrahepatic bile duct: Secondary | ICD-10-CM | POA: Diagnosis not present

## 2021-11-10 DIAGNOSIS — Z66 Do not resuscitate: Secondary | ICD-10-CM | POA: Diagnosis not present

## 2021-11-10 DIAGNOSIS — G893 Neoplasm related pain (acute) (chronic): Secondary | ICD-10-CM | POA: Diagnosis not present

## 2021-11-10 DIAGNOSIS — Z95828 Presence of other vascular implants and grafts: Secondary | ICD-10-CM

## 2021-11-10 DIAGNOSIS — C182 Malignant neoplasm of ascending colon: Secondary | ICD-10-CM

## 2021-11-10 DIAGNOSIS — C7800 Secondary malignant neoplasm of unspecified lung: Secondary | ICD-10-CM | POA: Diagnosis not present

## 2021-11-10 DIAGNOSIS — E8809 Other disorders of plasma-protein metabolism, not elsewhere classified: Secondary | ICD-10-CM | POA: Diagnosis not present

## 2021-11-10 DIAGNOSIS — R1011 Right upper quadrant pain: Secondary | ICD-10-CM | POA: Diagnosis not present

## 2021-11-10 DIAGNOSIS — J90 Pleural effusion, not elsewhere classified: Secondary | ICD-10-CM | POA: Diagnosis not present

## 2021-11-10 DIAGNOSIS — K769 Liver disease, unspecified: Secondary | ICD-10-CM | POA: Diagnosis not present

## 2021-11-10 LAB — COMPREHENSIVE METABOLIC PANEL
ALT: 18 U/L (ref 0–44)
AST: 40 U/L (ref 15–41)
Albumin: 2.6 g/dL — ABNORMAL LOW (ref 3.5–5.0)
Alkaline Phosphatase: 170 U/L — ABNORMAL HIGH (ref 38–126)
Anion gap: 8 (ref 5–15)
BUN: 15 mg/dL (ref 8–23)
CO2: 22 mmol/L (ref 22–32)
Calcium: 8 mg/dL — ABNORMAL LOW (ref 8.9–10.3)
Chloride: 103 mmol/L (ref 98–111)
Creatinine, Ser: 0.94 mg/dL (ref 0.61–1.24)
GFR, Estimated: >60 mL/min (ref >60)
Glucose, Bld: 288 mg/dL — ABNORMAL HIGH (ref 70–99)
Potassium: 3.4 mmol/L — ABNORMAL LOW (ref 3.5–5.1)
Sodium: 133 mmol/L — ABNORMAL LOW (ref 135–145)
Total Bilirubin: 1.2 mg/dL (ref 0.3–1.2)
Total Protein: 8 g/dL (ref 6.5–8.1)

## 2021-11-10 LAB — CBC WITH DIFFERENTIAL/PLATELET
Abs Immature Granulocytes: 0.02 10*3/uL (ref 0.00–0.07)
Basophils Absolute: 0 10*3/uL (ref 0.0–0.1)
Basophils Relative: 1 %
Eosinophils Absolute: 0.1 10*3/uL (ref 0.0–0.5)
Eosinophils Relative: 2 %
HCT: 31.8 % — ABNORMAL LOW (ref 39.0–52.0)
Hemoglobin: 10.8 g/dL — ABNORMAL LOW (ref 13.0–17.0)
Immature Granulocytes: 0 %
Lymphocytes Relative: 11 %
Lymphs Abs: 0.7 10*3/uL (ref 0.7–4.0)
MCH: 37.1 pg — ABNORMAL HIGH (ref 26.0–34.0)
MCHC: 34 g/dL (ref 30.0–36.0)
MCV: 109.3 fL — ABNORMAL HIGH (ref 80.0–100.0)
Monocytes Absolute: 0.6 10*3/uL (ref 0.1–1.0)
Monocytes Relative: 10 %
Neutro Abs: 4.7 10*3/uL (ref 1.7–7.7)
Neutrophils Relative %: 76 %
Platelets: 161 10*3/uL (ref 150–400)
RBC: 2.91 MIL/uL — ABNORMAL LOW (ref 4.22–5.81)
RDW: 14.6 % (ref 11.5–15.5)
WBC: 6.2 10*3/uL (ref 4.0–10.5)
nRBC: 0 % (ref 0.0–0.2)

## 2021-11-10 MED ORDER — OXYCODONE HCL 5 MG PO TABS: 5.0000 mg | ORAL_TABLET | Freq: Four times a day (QID) | ORAL | 0 refills | Status: DC | PRN

## 2021-11-10 MED ORDER — SODIUM CHLORIDE 0.9% FLUSH
10.0000 mL | Freq: Once | INTRAVENOUS | Status: AC
  Administered 2021-11-10: 10 mL via INTRAVENOUS
  Filled 2021-11-10: qty 10

## 2021-11-10 MED ORDER — HEPARIN SOD (PORK) LOCK FLUSH 100 UNIT/ML IV SOLN
500.0000 [IU] | Freq: Once | INTRAVENOUS | Status: AC
  Administered 2021-11-10: 500 [IU] via INTRAVENOUS
  Filled 2021-11-10: qty 5

## 2021-11-10 NOTE — Progress Notes (Signed)
Symptom Management [facility] Telephone:([phone]) [phone] Fax:([phone]) [phone]  Patient Care Team: Sofie Hartigan, MD as PCP - General (Family Medicine) Cammie Sickle, MD as Consulting Physician (Hematology and Oncology)   Name of the patient: Oscar [name]  [id number]  04-21-1941   Date of visit: 11/10/21  Reason for Consult:  oseph Goldammer is a 81 y.o. male with multiple medical problems including stage IV colon cancer s/p FOLFIRI plus Avastin, oral TAS-102 plus bevacizumab.  Patient underwent embolization to liver lesions on 01/15/2021.  He is status post Y 90 to liver lesions on 04/04/2021.  CT scan December 2022 revealed disease progression with increasing lung nodule/left moderate pleural effusion and new liver lesion/soft tissue implant right kidney.  Patient last saw Dr. Rogue Bussing 10/28/2021.  Decision was made to discontinue TS 102+ Bev given disease progression.  Patient was started on regorafenib (started on 11/04/2021).    Patient now presents to [facility] for evaluation of nausea, vomiting, and abdominal pain, which started after patient began the regorafenib.  Patient said that symptoms lasted 2 days prior to his self discontinuing regorafenib and subsequently improved daily.  Today, he denies nausea or vomiting.  He reports that he is able to tolerate keeping down food and fluids.  He continues to have right upper quadrant abdominal pain but says that this is also improving.  He is taking oxycodone once or twice a day and says that that helps.  He also endorses constipation but took a Dulcolax yesterday and had a small bowel movement this morning  Denies any neurologic complaints. Denies recent fevers or illnesses. Denies any easy bleeding or bruising. Reports good appetite and denies weight loss. Denies chest pain. Denies any nausea, vomiting, constipation, or diarrhea. Denies urinary complaints. Patient offers no further specific  complaints today.  PAST MEDICAL HISTORY: Past Medical History:  Diagnosis Date   Arthritis    Cancer ([place])    colon cancer 02/2019 per pt    Family history of adverse reaction to anesthesia    cousin took all night to wake up from anesthesia   H/O colon cancer, stage IV    Hyperlipemia    Neuromuscular disorder ([place])    Pre-diabetes     PAST SURGICAL HISTORY:  Past Surgical History:  Procedure Laterality Date   COLON SURGERY     ESOPHAGOGASTRODUODENOSCOPY (EGD) WITH PROPOFOL N/A 12/26/2020   Procedure: ESOPHAGOGASTRODUODENOSCOPY (EGD) WITH PROPOFOL;  Surgeon: [name], MD;  Location: [facility] ENDOSCOPY;  Service: Gastroenterology;  Laterality: N/A;   IR ANGIOGRAM SELECTIVE EACH ADDITIONAL VESSEL  12/19/2020   IR ANGIOGRAM SELECTIVE EACH ADDITIONAL VESSEL  03/24/2021   IR ANGIOGRAM SELECTIVE EACH ADDITIONAL VESSEL  03/24/2021   IR ANGIOGRAM SELECTIVE EACH ADDITIONAL VESSEL  04/04/2021   IR ANGIOGRAM SELECTIVE EACH ADDITIONAL VESSEL  04/04/2021   IR ANGIOGRAM SELECTIVE EACH ADDITIONAL VESSEL  04/04/2021   IR ANGIOGRAM VISCERAL SELECTIVE  12/19/2020   IR ANGIOGRAM VISCERAL SELECTIVE  03/24/2021   IR ANGIOGRAM VISCERAL SELECTIVE  04/04/2021   IR ANGIOGRAM VISCERAL SELECTIVE  04/04/2021   IR EMBO ARTERIAL NOT HEMORR HEMANG INC GUIDE ROADMAPPING  03/24/2021   IR EMBO TUMOR ORGAN ISCHEMIA INFARCT INC GUIDE ROADMAPPING  12/19/2020   IR EMBO TUMOR ORGAN ISCHEMIA INFARCT INC GUIDE ROADMAPPING  04/04/2021   IR IMAGING GUIDED PORT INSERTION  07/20/2019   IR RADIOLOGIST EVAL & MGMT  12/05/2020   IR RADIOLOGIST EVAL & MGMT  02/11/2021   IR RADIOLOGIST EVAL & MGMT  03/04/2021   IR RADIOLOGIST EVAL & MGMT  04/29/2021   IR RADIOLOGIST EVAL & MGMT  07/22/2021   IR US GUIDE VASC ACCESS RIGHT  12/19/2020   IR US GUIDE VASC ACCESS RIGHT  03/24/2021   IR US GUIDE VASC ACCESS RIGHT  04/04/2021   JOINT REPLACEMENT Left 2010   Knee   RADIOLOGY WITH ANESTHESIA N/A 01/15/2021   Procedure: CT WITH ANESTHESIA MICROWAVE ABLATION OF  LIVER;  Surgeon: Criselda Peaches, MD;  Location: [facility] ORS;  Service: Anesthesiology;  Laterality: N/A;    HEMATOLOGY/ONCOLOGY HISTORY:  Oncology History Overview Note  # MAY 2020- 3. 03/09/19 Liver biopsy. Microscopic examination shows malignant cells with glandular architecture consistent with adenocarcinoma. The malignant cells are positive for CK20 and CDX-2. These findings support the clinical impression of metastatic colon adenocarcinoma. 4. 03/10/19 R hemicolectomy. Tumor site cecum. Adenocarcinoma. Mucinous features present. G2. No tumor deposits. Invades visceral peritoneum. No tumor perforation. LVI present. PNI not identified. All margins uninvolved. 1/12 LNs. PT4apN1. Periappendiceal inflammation c/w resolving abscess. Microsatellite stable (MSS). [Dr.Mettu; [facility]]  # SEP 4th 2020 [compared to May 2020]  Interval increase in size of the metastases to the hepatic dome, The metastasis to the left hepatic lobe is unchanged; 2.  New subcentimeter hypoattenuating lesion in the inferior right hepatic lobe, incompletely characterized on CT. 3.  Postsurgical changes following right hemicolectomy.  # OCT 2020- FOLFOX +avastin; CT dec 22nd 2020- [compared to [facility] sep 9th 2020]-Liver- slight progression versus stable disease; CT scan SEP 4th 2021- Progressive disease-left lower lobe lung nodule 8 mm [previously 4 mm]; increase in size of the hepatic metastases by few millimeters.;  Soft tissue nodule adjacent to anastomotic site again increased by few millimeters.STOP FOLFOX; cont avastin  #  SEP 20th, 2021- FOLFIRI+ AVASTIN; HOLD FEB 2022- sec to poor tolerance [peptic ulcer]; 3/30-liver ablation-   [bland embolization of segment 2/3 liver lesion on 12/19/2020;  microwave ablation of both liver lesions.  # ?July 25th, 2022- BEV _ TAS 102.MAY 2022- s/p Liver ablation  # DEC 2022-progressive disease-lung and liver/abdomen; START Regorefenib.   # FEB /16-EGD [Dr.Anna-duodenal ulcer-? meloxicam]  #  JAN 2022- COVID [s/p Mab infusion; pills; skin rash resolved.]  # NGS/F-ONE-MUTATED K-RAS [G]  # PALLIATIVE CARE EVALUATION: 09/20/2019-[name]  # PAIN MANAGEMENT: NA   DIAGNOSIS: COLON CANCER  STAGE:  IV     ;  GOALS:Palliative      Cancer of right colon ([name])  07/05/2019 Initial Diagnosis   Cancer of right colon ([place])   07/24/2019 - 06/25/2020 Chemotherapy   The patient had dexamethasone (DECADRON) 4 MG tablet, 8 mg, Oral, Daily, 1 of 1 cycle, Start date: --, End date: -- palonosetron (ALOXI) injection 0.25 mg, 0.25 mg, Intravenous,  Once, 23 of 25 cycles Administration: 0.25 mg (07/24/2019), 0.25 mg (08/07/2019), 0.25 mg (08/21/2019), 0.25 mg (09/04/2019), 0.25 mg (09/20/2019), 0.25 mg (10/04/2019), 0.25 mg (10/16/2019), 0.25 mg (10/30/2019), 0.25 mg (11/22/2019), 0.25 mg (12/06/2019), 0.25 mg (12/20/2019), 0.25 mg (01/03/2020), 0.25 mg (01/29/2020), 0.25 mg (02/12/2020), 0.25 mg (02/26/2020), 0.25 mg (03/11/2020), 0.25 mg (03/25/2020), 0.25 mg (04/08/2020), 0.25 mg (04/24/2020), 0.25 mg (05/08/2020), 0.25 mg (05/22/2020), 0.25 mg (06/10/2020), 0.25 mg (06/25/2020) leucovorin 800 mg in dextrose 5 % 250 mL infusion, 844 mg, Intravenous,  Once, 23 of 25 cycles Administration: 800 mg (07/24/2019), 800 mg (08/07/2019), 800 mg (08/21/2019), 800 mg (09/04/2019), 800 mg (09/20/2019), 800 mg (10/04/2019), 800 mg (10/16/2019), 800 mg (10/30/2019), 800 mg (11/22/2019), 800 mg (12/06/2019), 800 mg (12/20/2019), 800 mg (  01/03/2020), 800 mg (01/29/2020), 800 mg (02/12/2020), 800 mg (02/26/2020), 800 mg (03/11/2020), 800 mg (03/25/2020), 800 mg (04/08/2020), 800 mg (04/24/2020), 800 mg (05/08/2020), 800 mg (05/22/2020), 800 mg (06/10/2020), 800 mg (06/25/2020) oxaliplatin (ELOXATIN) 180 mg in dextrose 5 % 500 mL chemo infusion, 85 mg/m2 = 180 mg, Intravenous,  Once, 23 of 25 cycles Dose modification: 178 mg (original dose 85 mg/m2, Cycle 17, Reason: Other (see comments), Comment: insurance adjusted dose ) Administration: 180 mg (07/24/2019), 180 mg  (08/07/2019), 180 mg (08/21/2019), 180 mg (09/04/2019), 180 mg (09/20/2019), 180 mg (10/04/2019), 180 mg (10/16/2019), 180 mg (10/30/2019), 180 mg (11/22/2019), 180 mg (12/06/2019), 180 mg (12/20/2019), 180 mg (01/03/2020), 180 mg (01/29/2020), 180 mg (02/12/2020), 180 mg (02/26/2020), 180 mg (03/11/2020), 180 mg (04/08/2020), 180 mg (04/24/2020), 180 mg (05/08/2020), 180 mg (05/22/2020), 180 mg (06/10/2020), 180 mg (06/25/2020) fluorouracil (ADRUCIL) 5,000 mg in sodium chloride 0.9 % 150 mL chemo infusion, 5,050 mg, Intravenous, 1 Day/Dose, 23 of 25 cycles Administration: 5,000 mg (07/24/2019), 5,000 mg (08/07/2019), 5,000 mg (08/21/2019), 5,050 mg (09/04/2019), 5,000 mg (10/04/2019), 5,000 mg (10/16/2019), 5,000 mg (11/22/2019), 5,000 mg (12/06/2019), 5,000 mg (12/20/2019), 5,000 mg (01/03/2020), 5,000 mg (01/29/2020), 5,000 mg (02/12/2020), 5,000 mg (02/26/2020), 5,000 mg (03/11/2020), 5,000 mg (03/25/2020), 5,000 mg (04/08/2020), 5,000 mg (04/24/2020), 5,000 mg (05/08/2020), 5,000 mg (05/22/2020), 5,000 mg (06/10/2020), 5,000 mg (06/25/2020) bevacizumab-bvzr (ZIRABEV) 400 mg in sodium chloride 0.9 % 100 mL chemo infusion, 5 mg/kg = 400 mg, Intravenous,  Once, 23 of 25 cycles Administration: 400 mg (08/07/2019), 400 mg (08/21/2019), 400 mg (09/04/2019), 400 mg (09/20/2019), 400 mg (10/04/2019), 400 mg (10/16/2019), 400 mg (10/30/2019), 400 mg (11/22/2019), 400 mg (12/06/2019), 400 mg (12/20/2019), 400 mg (01/03/2020), 400 mg (01/29/2020), 400 mg (02/12/2020), 400 mg (02/26/2020), 400 mg (03/11/2020), 400 mg (03/25/2020), 400 mg (04/08/2020), 400 mg (04/24/2020), 400 mg (05/08/2020), 400 mg (05/22/2020), 400 mg (06/10/2020), 400 mg (06/25/2020)   for chemotherapy treatment.     07/08/2020 -  Chemotherapy   Patient is on Treatment Plan : COLORECTAL FOLFIRI / BEVACIZUMAB Q14D     11/05/2020 Cancer Staging   Staging form: Colon and Rectum, AJCC 8th Edition - Clinical: Stage IVC (pM1c) - Signed by Cammie Sickle, MD on 11/05/2020      ALLERGIES:  is allergic to  ace inhibitors.  MEDICATIONS:  Current Outpatient Medications  Medication Sig Dispense Refill   amLODipine (NORVASC) 5 MG tablet Take 5 mg by mouth daily. (Patient not taking: Reported on 10/03/2021)     ondansetron (ZOFRAN) 4 MG tablet Take 1 tablet (4 mg total) by mouth every 8 (eight) hours as needed for nausea or vomiting. (Patient not taking: Reported on 02/24/2021) 20 tablet 0   ondansetron (ZOFRAN) 8 MG tablet Take 1 tablet (8 mg total) by mouth every 8 (eight) hours as needed for nausea or vomiting. (Patient not taking: Reported on 02/24/2021) 20 tablet 0   oxyCODONE (OXY IR/ROXICODONE) 5 MG immediate release tablet Take 1 tablet (5 mg total) by mouth every 12 (twelve) hours as needed for severe pain. 60 tablet 0   pravastatin (PRAVACHOL) 40 MG tablet Take 40 mg by mouth daily.  (Patient not taking: Reported on 06/09/2021)     prochlorperazine (COMPAZINE) 10 MG tablet Take 1 tablet (10 mg total) by mouth every 6 (six) hours as needed for nausea or vomiting. (Patient not taking: Reported on 01/28/2021) 60 tablet 0   regorafenib (STIVARGA) 40 MG tablet Take 2 tablets (69m) by mouth daily for 7 days, then 3 tablets (1269m  daily for 7 days, then 4 tablets (185m) daily for 7 days, then hold for 7 days.Take with low fat meal (breakfast). 63 tablet 0   sucralfate (CARAFATE) 1 g tablet Take 1 tablet (1 g total) by mouth 4 (four) times daily -  with meals and at bedtime. 90 tablet 3   tamsulosin (FLOMAX) 0.4 MG CAPS capsule Take 1 capsule (0.4 mg total) by mouth daily. 30 capsule 11   No current facility-administered medications for this visit.    VITAL SIGNS: There were no vitals taken for this visit. There were no vitals filed for this visit.  Estimated body mass index is 23.68 kg/m as calculated from the following:   Height as of 10/28/21: 6' (1.829 m).   Weight as of 10/28/21: 174 lb 9.6 oz (79.2 kg).  LABS: CBC:    Component Value Date/Time   WBC 1.6 (L) 10/28/2021 1014   HGB 10.0 (L)  10/28/2021 1014   HGB 13.3 09/18/2013 0424   HCT 29.4 (L) 10/28/2021 1014   HCT 38.1 (L) 09/18/2013 0424   PLT 163 10/28/2021 1014   PLT 307 09/18/2013 0424   MCV 110.1 (H) 10/28/2021 1014   MCV 94 09/18/2013 0424   NEUTROABS 0.7 (L) 10/28/2021 1014   NEUTROABS 17.0 (H) 09/18/2013 0424   LYMPHSABS 0.5 (L) 10/28/2021 1014   LYMPHSABS 0.9 (L) 09/18/2013 0424   MONOABS 0.3 10/28/2021 1014   MONOABS 0.4 09/18/2013 0424   EOSABS 0.1 10/28/2021 1014   EOSABS 0.0 09/18/2013 0424   BASOSABS 0.0 10/28/2021 1014   BASOSABS 0.0 09/18/2013 0424   Comprehensive Metabolic Panel:    Component Value Date/Time   NA 133 (L) 10/28/2021 1014   NA 137 09/18/2013 0424   K 3.8 10/28/2021 1014   K 4.5 09/18/2013 0424   CL 104 10/28/2021 1014   CL 107 09/18/2013 0424   CO2 24 10/28/2021 1014   CO2 28 09/18/2013 0424   BUN 11 10/28/2021 1014   BUN 19 (H) 09/18/2013 0424   CREATININE 0.84 10/28/2021 1014   CREATININE 1.15 09/18/2013 0424   GLUCOSE 225 (H) 10/28/2021 1014   GLUCOSE 196 (H) 09/18/2013 0424   CALCIUM 7.9 (L) 10/28/2021 1014   CALCIUM 9.1 09/18/2013 0424   AST 35 10/28/2021 1014   ALT 17 10/28/2021 1014   ALKPHOS 155 (H) 10/28/2021 1014   BILITOT 0.9 10/28/2021 1014   PROT 7.5 10/28/2021 1014   ALBUMIN 2.9 (L) 10/28/2021 1014    RADIOGRAPHIC STUDIES: No results found.  PERFORMANCE STATUS (ECOG) : 1 - Symptomatic but completely ambulatory  Review of Systems Unless otherwise noted, a complete review of systems is negative.  Physical Exam General: NAD Cardiovascular: regular rate and rhythm Pulmonary: clear ant fields Abdomen: soft, nontender, + bowel sounds GU: no suprapubic tenderness Extremities: no edema, no joint deformities Skin: no rashes Neurological: Weakness but otherwise nonfocal  Assessment and Plan- Patient is a 81y.o. male with multiple medical problems including stage IV colon cancer status post multiple lines of previous treatment most recently on  regorafenib who subsequently developed nausea, vomiting, and abdominal pain  Stage IV colon cancer -disease progression on recent imaging.  Patient is pending MRI later this week ordered by IR.  It appears that he has poor tolerance with onset of GI symptoms on regorafenib.  Discussed with Dr. B patient instructed to hold regorafenib until patient is seen by Dr. BRogue Bussingnext week.   Nausea and vomiting -patient reports that this has resolved.  He declined  IV fluids today.  Labs grossly unchanged from baseline showed borderline hyponatremia/hypokalemia.  This should improve now that he is appetite is better.  Push oral fluids.  Patient was not taking antiemetics and we discussed use as needed.  Abdominal pain -likely secondary to liver metastasis +/- regorafenib.  Hold regorafenib for now.  We will liberalize oxycodone to every 6 hours as needed.  Case and plan discussed with Dr. Jacinto Reap.   Patient expressed understanding and was in agreement with this plan. He also understands that He can call clinic at any time with any questions, concerns, or complaints.   Thank you for allowing me to participate in the care of this very pleasant patient.   Time Total: 20 minutes  Visit consisted of counseling and education dealing with the complex and emotionally intense issues of symptom management in the setting of serious illness.Greater than 50%  of this time was spent counseling and coordinating care related to the above assessment and plan.  Signed by: Altha Harm, PhD, NP-C

## 2021-11-10 NOTE — Progress Notes (Signed)
Pt reports that he is currently experiencing right lower quadrant abdominal pain, radiating to his back. States that this pain started Thursday, along with nausea and vomiting. Rates pain 4/10. He started stated new chemo pill last Tuesday, but stopped taking it Thursday do to these symptoms. States that nausea and vomiting have resolved but abdominal pain persists.

## 2021-11-10 NOTE — Telephone Encounter (Signed)
Pt contacted and agreeable to [facility]. Appointments scheduled.

## 2021-11-14 ENCOUNTER — Ambulatory Visit
Admission: RE | Admit: 2021-11-14 | Discharge: 2021-11-14 | Disposition: A | Discharge status: Home / Self Care | Payer: Medicare HMO | Source: Ambulatory Visit | Attending: Interventional Radiology | Admitting: Interventional Radiology

## 2021-11-14 ENCOUNTER — Other Ambulatory Visit: Payer: Self-pay

## 2021-11-14 DIAGNOSIS — C189 Malignant neoplasm of colon, unspecified: Secondary | ICD-10-CM | POA: Diagnosis not present

## 2021-11-14 DIAGNOSIS — K8689 Other specified diseases of pancreas: Secondary | ICD-10-CM | POA: Diagnosis not present

## 2021-11-14 DIAGNOSIS — K7689 Other specified diseases of liver: Secondary | ICD-10-CM | POA: Diagnosis not present

## 2021-11-14 DIAGNOSIS — C787 Secondary malignant neoplasm of liver and intrahepatic bile duct: Secondary | ICD-10-CM | POA: Diagnosis not present

## 2021-11-14 IMAGING — MR MR ABDOMEN WO/W CM
16 of 18 series · 42 of 48 positions shown · IV contrast (gadavist)
Comparison: [DATE] MRI abdomen. [DATE] CT chest, abdomen
and pelvis.

CLINICAL DATA: Metastatic colon cancer. History of right
hemicolectomy. History of bland embolization of liver metastases
[DATE], microwave ablation of left liver metastasis [DATE]
and Y-90 radioembolization of segments 6 and 8 right liver lesions
[DATE]. Restaging.

EXAM:
MRI ABDOMEN WITHOUT AND WITH CONTRAST
TECHNIQUE: Multiplanar multisequence MR imaging of the abdomen was performed
both before and after the administration of intravenous contrast.
CONTRAST:  7.5mL GADAVIST GADOBUTROL 1 MMOL/ML IV SOLN

[Series 3: T2 · coronal · 6.0mm · 1.19mm/px · 2 of 32 slices shown (1 of 2)]
[im 1/32]
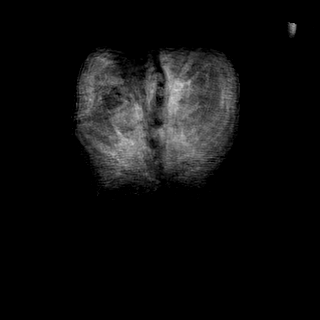
[im 32/32]
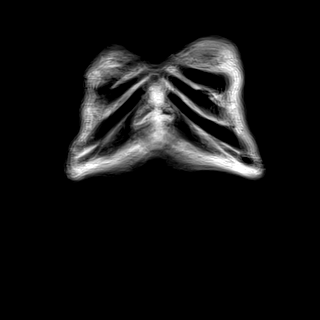

[Series 4: T2 · axial · 6.0mm · 1.19mm/px · z∈[-185,+52]mm · 2 of 34 slices shown (2 of 2)]
[im 1/34]
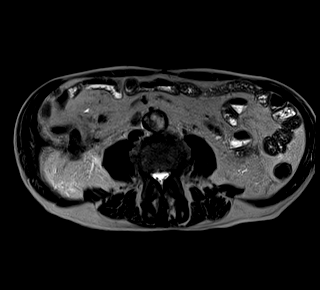
[im 34/34]
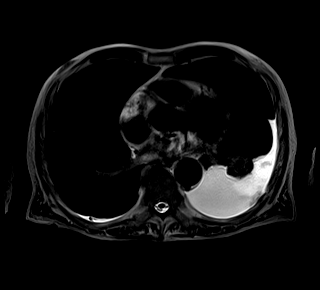

[Series 6: T2 fat-sat · axial · 6.0mm · 1.19mm/px · z∈[-191,+47]mm · 2 of 34 slices shown]
[im 1/34]
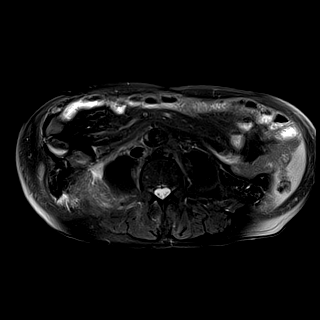
[im 34/34]
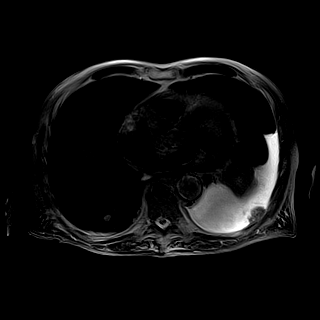

[Series 7: ax dwi_tracew · axial · 6.0mm · 1.42mm/px · z∈[-191,+47]mm · 4 of 102 slices shown]
[im 1/102]
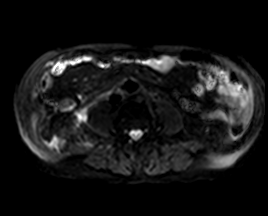
[im 34/102]
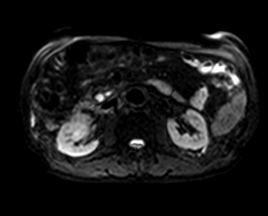
[im 68/102]
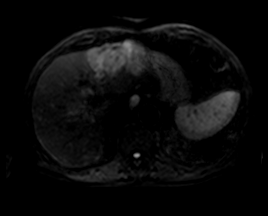
[im 102/102]
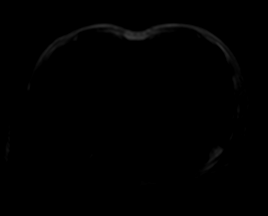

[Series 8: ax dwi_adc · axial · 6.0mm · 1.42mm/px · 1 of 34 slices shown]
[im 1/34]
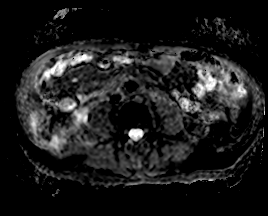

[Series 9: T1 · axial · 3.0mm · 1.19mm/px · z∈[-185,+52]mm · 3 of 80 slices shown (1 of 2)]
[im 1/80]
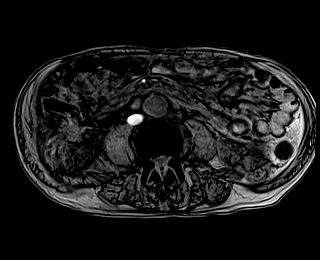
[im 40/80]
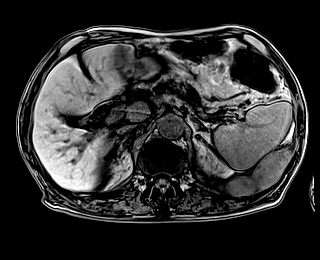
[im 80/80]
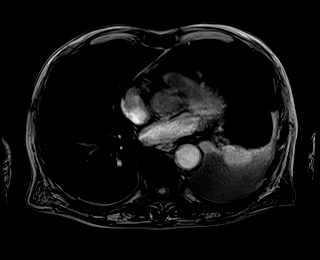

[Series 10: T1 · axial · 3.0mm · 1.19mm/px · z∈[-185,+52]mm · 3 of 80 slices shown (2 of 2)]
[im 1/80]
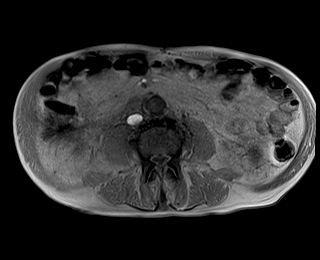
[im 40/80]
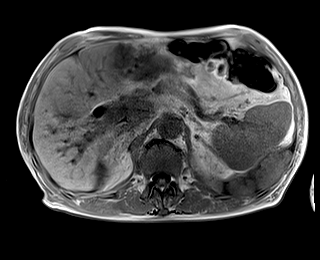
[im 80/80]
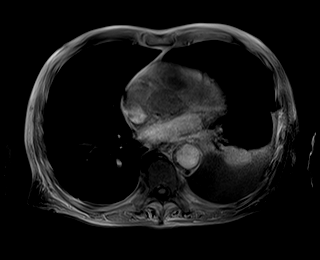

[Series 11: bSSFP · axial · 6.0mm · 0.74mm/px · 1 of 34 slices shown]
[im 1/34]
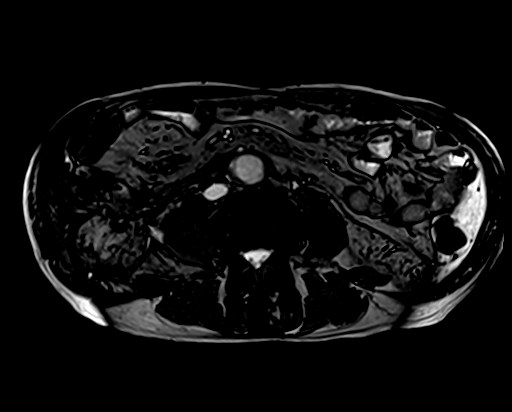

[Series 12: T1 dynamic fat-sat · axial · non-contrast · 3.0mm · 1.19mm/px · z∈[-185,+52]mm · 3 of 80 slices shown (1 of 4)]
[im 1/80]
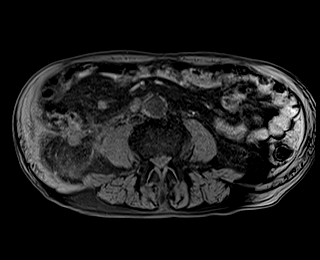
[im 40/80]
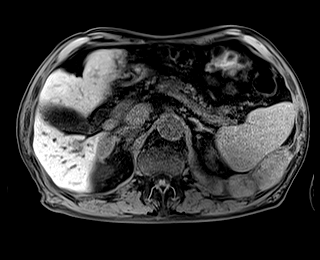
[im 80/80]
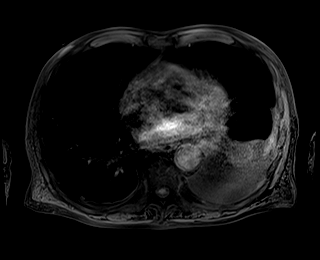

[Series 13: T1 dynamic fat-sat post-contrast · axial · 3.0mm · 1.19mm/px · z∈[-185,+52]mm · 3 of 80 slices shown (1 of 3)]
[im 1/80]
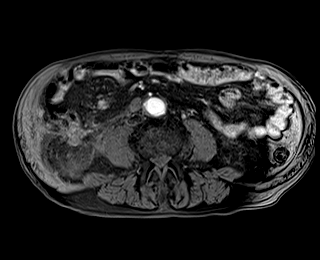
[im 40/80]
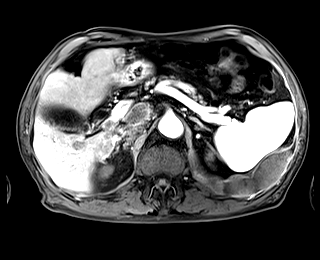
[im 80/80]
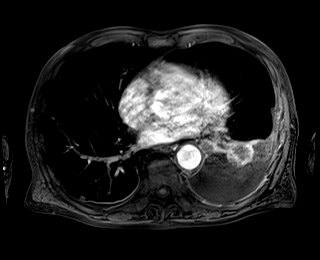

[Series 14: T1 dynamic fat-sat · axial · 3.0mm · 1.19mm/px · z∈[-185,+52]mm · 3 of 80 slices shown (2 of 4)]
[im 1/80]
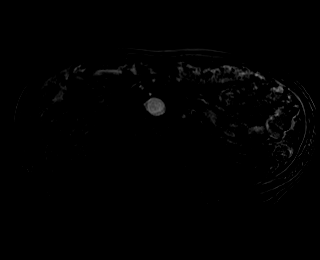
[im 40/80]
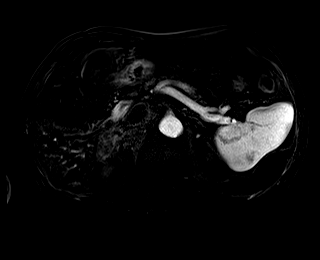
[im 80/80]
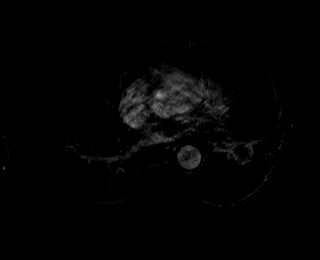

[Series 15: T1 dynamic fat-sat post-contrast · axial · 3.0mm · 1.19mm/px · z∈[-185,+52]mm · 3 of 80 slices shown (2 of 3)]
[im 1/80]
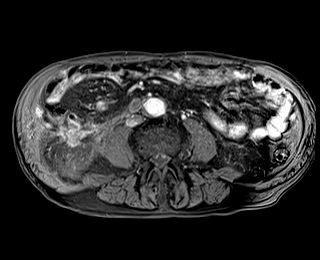
[im 40/80]
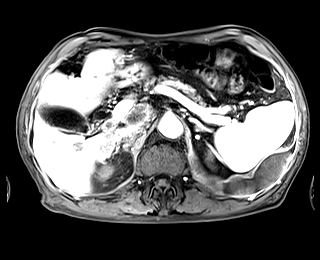
[im 80/80]
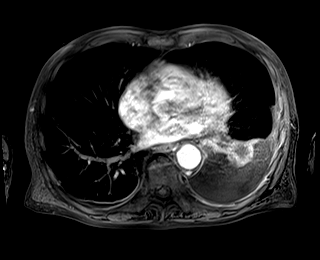

[Series 16: T1 dynamic fat-sat · axial · 3.0mm · 1.19mm/px · z∈[-185,+52]mm · 3 of 80 slices shown (3 of 4)]
[im 1/80]
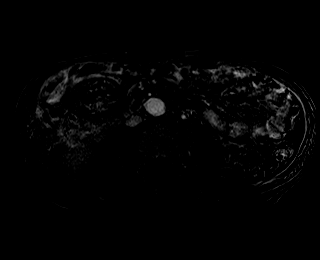
[im 40/80]
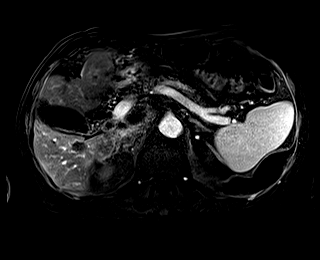
[im 80/80]
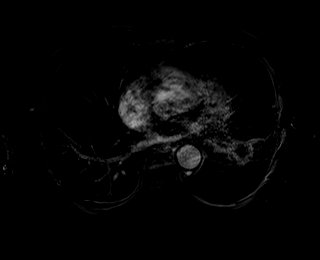

[Series 17: T1 dynamic fat-sat post-contrast · axial · 3.0mm · 1.19mm/px · z∈[-185,+52]mm · 3 of 80 slices shown (3 of 3)]
[im 1/80]
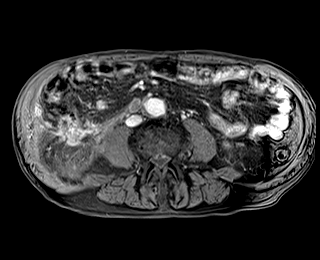
[im 40/80]
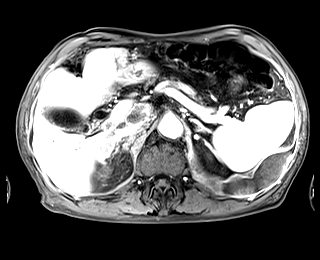
[im 80/80]
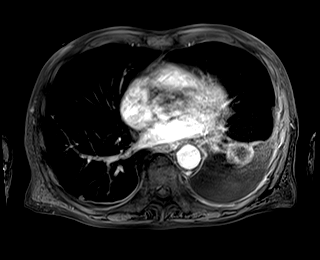

[Series 18: T1 dynamic fat-sat · axial · 3.0mm · 1.19mm/px · z∈[-185,+52]mm · 3 of 80 slices shown (4 of 4)]
[im 1/80]
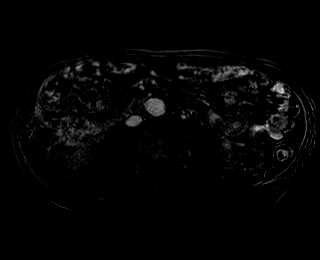
[im 40/80]
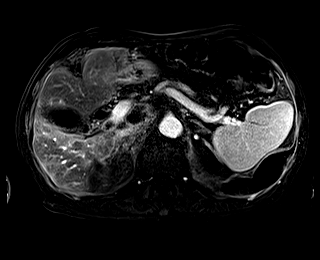
[im 80/80]
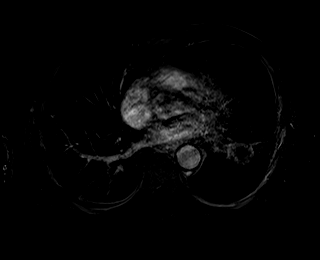

[Series 19: T1 dynamic post-contrast · coronal · 3.0mm · 1.31mm/px · 3 of 72 slices shown]
[im 1/72]
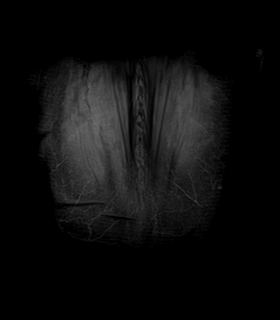
[im 36/72]
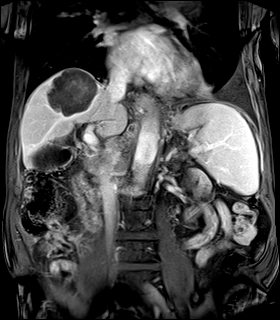
[im 72/72]
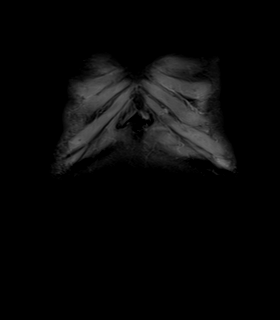

[42 of 48 positions shown; findings below may reference images not displayed]

FINDINGS: Lower chest: Moderate left pleural effusion is similar to prior MRI.
Multifocal bulky left pleural masses are not substantially changed.
Representative 6.2 x 3.1 cm basilar left pleural mass (series
4/image 17), previously 6.0 x 3.0 cm on [DATE] MRI.
Representative 3.7 x 2.5 cm basilar left pleural mass (series
4/image 19), previously 3.6 x 2.5 cm. Representative 3.1 x 2.3 cm
posterior left pleural mass (series 4/image 7), previously 3.0 x
cm. Multiple scattered pulmonary nodules at both lung bases, new and
increased since [DATE] MRI and probably increased since
[DATE] chest CT, for example a 1.3 cm basilar right lower lobe
pulmonary nodule (series 4/image 6), previously 0.8 cm on [DATE]
chest CT and new since [DATE] MRI.

Hepatobiliary: Numerous (at least 8) small hypoenhancing liver
masses scattered throughout the liver are new or increased since
[DATE] MRI, the largest measuring 1.0 x 1.0 cm in the segment 7
right liver (series 15/image 21), increased from 0.5 x 0.4 cm on
[DATE] MRI. Representative 0.7 x 0.5 cm lesion in the segment 6
right liver (series 15/image 44), increased from 0.3 x 0.3 cm on
[DATE] MRI. Representative new 0.7 x 0.6 cm in the peripheral
segment 8 right liver (series 15/image 19).

Irregular 8.3 x 7.4 cm segment 8 right liver mass (series 20/image
15), previously 8.6 x 7.4 cm using similar measurement technique,
not substantially changed in size, however with increased
heterogeneous peripheral enhancement most prominent anteriorly
(series 21/image 14).

Irregular 8.3 x 6.0 cm anterior left liver mass (series 20/image
27), increased from 6.9 x 4.3 cm, with increased heterogeneous
peripheral enhancement on the subtraction sequences.

Ablated 2.3 x 2.0 cm segment 6 right liver mass (series 20/image
44), previously 2.3 x 2.2 cm, not appreciably changed, with similar
thin relatively uniform mural enhancement.

Far inferior right liver 2.3 x 1.9 cm capsular mass with thick
irregular peripheral enhancement (series 20/image 55), previously
1.9 x 1.6 cm, mildly increased.

No hepatic steatosis.

Normal gallbladder with no cholelithiasis. Mild central intrahepatic
biliary ductal dilatation is mildly increased from [DATE] MRI.
Dilated common bile duct with diameter 14 mm, increased from 9 mm on
[DATE] MRI. No evidence of choledocholithiasis. No discrete
obstructing biliary mass.

Pancreas: No pancreatic mass. Generalized pancreatic duct dilation
up to 5 mm diameter, increased from 3 mm on [DATE] MRI. No
pancreas divisum.

Spleen: Normal size. No mass.

Adrenals/Urinary Tract: Heterogeneous enhancing 4.6 x 2.5 cm right
adrenal mass (series 13/image 44), increased from 3.0 x 1.6 cm on
[DATE] MRI. No left adrenal mass. No hydronephrosis.
Infiltrative anterior upper right renal cortical 4.5 x 4.4 cm
heterogeneous enhancing mass (series 13/image 53), increased from
3.6 x 3.1 cm on [DATE] MRI. Exophytic simple 4.7 cm lateral
lower left renal cyst.

Stomach/Bowel: Normal non-distended stomach. Partially visualized
postsurgical changes from subtotal right hemicolectomy with
ileocolic anastomosis in the right abdomen. No dilated small or
large bowel loops.

Vascular/Lymphatic: Atherosclerotic abdominal aorta with stable
cm infrarenal abdominal aortic aneurysm. The anterior upper right
renal cortical mass either increasingly encases or invades the right
renal vein. Enlarged necrotic pathologic retroperitoneal lymph nodes
have increased since [DATE] MRI. Representative 2.7 cm
portacaval node (series 20/image 39), increased from 1.5 cm.
Representative 2.3 cm retrocaval node (series 20/image 46),
increased from 1.7 cm. Representative 1.6 cm aortocaval node (series
20/image 63), increased from 1.4 cm.

Other: Trace perihepatic ascites is similar. No focal fluid
collections.

Enhancing 1.0 cm right peritoneal soft tissue mass near the inferior
right liver (series 20/image 59), increased from 0.6 cm on
[DATE] MRI.

Enhancing 2.1 cm anterior right peritoneal soft tissue mass (series
20/image 67), increased from 1.4 cm on [DATE] MRI.

Heterogeneous enhancing 3.8 x 3.2 cm posterior right peritoneal mass
(series 22/image 70), mildly increased from 3.4 x 2.8 cm on
[DATE] MRI.

Musculoskeletal: No aggressive appearing focal osseous lesions.
IMPRESSION: 1. Significant interval progression of widespread metastatic disease
since [DATE] MRI.
2. Numerous (at least 8) new or enlarging small liver metastases.
Recurrent viable malignancy within previously treated large segment
8 and anterior left liver metastases.
3. Progressive metastatic retroperitoneal lymphadenopathy.
4. Progressive multifocal right peritoneal metastases.
5. Progressive right adrenal and upper right renal metastases. The
upper right renal metastasis either increasingly encases or invades
the right renal vein.
6. Progressive pulmonary metastases at the lung bases.
7. Multifocal bulky left pleural metastases and moderate left
pleural effusion, not substantially changed.
8. Mild central intrahepatic and more prominent extrahepatic biliary
ductal dilatation and mild pancreatic duct dilation, increased from
[DATE] MRI. No discrete obstructing biliary mass. No
choledocholithiasis. Correlate with serum bilirubin levels and with
any opioid analgesic use.
9. Stable trace perihepatic ascites.

## 2021-11-14 MED ORDER — GADOBUTROL 1 MMOL/ML IV SOLN
7.5000 mL | Freq: Once | INTRAVENOUS | Status: AC | PRN
  Administered 2021-11-14: 7.5 mL via INTRAVENOUS

## 2021-11-17 ENCOUNTER — Encounter: Payer: Self-pay | Admitting: Internal Medicine

## 2021-11-18 ENCOUNTER — Encounter: Payer: Self-pay | Admitting: Internal Medicine

## 2021-11-18 ENCOUNTER — Inpatient Hospital Stay (HOSPITAL_BASED_OUTPATIENT_CLINIC_OR_DEPARTMENT_OTHER): Payer: Medicare HMO | Admitting: Internal Medicine

## 2021-11-18 ENCOUNTER — Inpatient Hospital Stay: Payer: Medicare HMO | Admitting: Pharmacist

## 2021-11-18 ENCOUNTER — Other Ambulatory Visit: Payer: Self-pay

## 2021-11-18 ENCOUNTER — Other Ambulatory Visit (HOSPITAL_COMMUNITY): Payer: Self-pay

## 2021-11-18 ENCOUNTER — Inpatient Hospital Stay: Payer: Medicare HMO

## 2021-11-18 ENCOUNTER — Inpatient Hospital Stay (HOSPITAL_BASED_OUTPATIENT_CLINIC_OR_DEPARTMENT_OTHER): Payer: Medicare HMO | Admitting: Hospice and Palliative Medicine

## 2021-11-18 DIAGNOSIS — J90 Pleural effusion, not elsewhere classified: Secondary | ICD-10-CM | POA: Diagnosis not present

## 2021-11-18 DIAGNOSIS — C189 Malignant neoplasm of colon, unspecified: Secondary | ICD-10-CM

## 2021-11-18 DIAGNOSIS — C182 Malignant neoplasm of ascending colon: Secondary | ICD-10-CM | POA: Diagnosis not present

## 2021-11-18 DIAGNOSIS — C787 Secondary malignant neoplasm of liver and intrahepatic bile duct: Secondary | ICD-10-CM

## 2021-11-18 DIAGNOSIS — K769 Liver disease, unspecified: Secondary | ICD-10-CM | POA: Diagnosis not present

## 2021-11-18 DIAGNOSIS — R1011 Right upper quadrant pain: Secondary | ICD-10-CM | POA: Diagnosis not present

## 2021-11-18 DIAGNOSIS — Z515 Encounter for palliative care: Secondary | ICD-10-CM

## 2021-11-18 DIAGNOSIS — E8809 Other disorders of plasma-protein metabolism, not elsewhere classified: Secondary | ICD-10-CM | POA: Diagnosis not present

## 2021-11-18 DIAGNOSIS — C7800 Secondary malignant neoplasm of unspecified lung: Secondary | ICD-10-CM | POA: Diagnosis not present

## 2021-11-18 DIAGNOSIS — Z66 Do not resuscitate: Secondary | ICD-10-CM | POA: Diagnosis not present

## 2021-11-18 DIAGNOSIS — G893 Neoplasm related pain (acute) (chronic): Secondary | ICD-10-CM | POA: Diagnosis not present

## 2021-11-18 LAB — CBC WITH DIFFERENTIAL/PLATELET
Abs Immature Granulocytes: 0.08 10*3/uL — ABNORMAL HIGH (ref 0.00–0.07)
Basophils Absolute: 0 10*3/uL (ref 0.0–0.1)
Basophils Relative: 1 %
Eosinophils Absolute: 0.3 10*3/uL (ref 0.0–0.5)
Eosinophils Relative: 5 %
HCT: 34.6 % — ABNORMAL LOW (ref 39.0–52.0)
Hemoglobin: 11.4 g/dL — ABNORMAL LOW (ref 13.0–17.0)
Immature Granulocytes: 2 %
Lymphocytes Relative: 17 %
Lymphs Abs: 0.9 10*3/uL (ref 0.7–4.0)
MCH: 36.3 pg — ABNORMAL HIGH (ref 26.0–34.0)
MCHC: 32.9 g/dL (ref 30.0–36.0)
MCV: 110.2 fL — ABNORMAL HIGH (ref 80.0–100.0)
Monocytes Absolute: 0.6 10*3/uL (ref 0.1–1.0)
Monocytes Relative: 11 %
Neutro Abs: 3.6 10*3/uL (ref 1.7–7.7)
Neutrophils Relative %: 64 %
Platelets: 197 10*3/uL (ref 150–400)
RBC: 3.14 MIL/uL — ABNORMAL LOW (ref 4.22–5.81)
RDW: 14 % (ref 11.5–15.5)
WBC: 5.5 10*3/uL (ref 4.0–10.5)
nRBC: 0 % (ref 0.0–0.2)

## 2021-11-18 LAB — COMPREHENSIVE METABOLIC PANEL
ALT: 17 U/L (ref 0–44)
AST: 36 U/L (ref 15–41)
Albumin: 2.7 g/dL — ABNORMAL LOW (ref 3.5–5.0)
Alkaline Phosphatase: 186 U/L — ABNORMAL HIGH (ref 38–126)
Anion gap: 8 (ref 5–15)
BUN: 9 mg/dL (ref 8–23)
CO2: 28 mmol/L (ref 22–32)
Calcium: 8.6 mg/dL — ABNORMAL LOW (ref 8.9–10.3)
Chloride: 103 mmol/L (ref 98–111)
Creatinine, Ser: 0.87 mg/dL (ref 0.61–1.24)
GFR, Estimated: >60 mL/min (ref >60)
Glucose, Bld: 117 mg/dL — ABNORMAL HIGH (ref 70–99)
Potassium: 3.5 mmol/L (ref 3.5–5.1)
Sodium: 139 mmol/L (ref 135–145)
Total Bilirubin: 0.3 mg/dL (ref 0.3–1.2)
Total Protein: 8.4 g/dL — ABNORMAL HIGH (ref 6.5–8.1)

## 2021-11-18 LAB — URINALYSIS, COMPLETE (UACMP) WITH MICROSCOPIC
Bacteria, UA: NONE SEEN
Glucose, UA: NEGATIVE mg/dL
Ketones, ur: NEGATIVE mg/dL
Leukocytes,Ua: NEGATIVE
Nitrite: NEGATIVE
Specific Gravity, Urine: 1.02 (ref 1.005–1.030)
pH: 6.5 (ref 5.0–8.0)

## 2021-11-18 NOTE — Progress Notes (Signed)
[place]  [phone] [phone]   Name: Oscar [name] Date: 11/18/2021 MRN: [id number]  DOB: Feb 17, 1941  Patient Care Team: Sofie Hartigan, MD as PCP - General (Family Medicine) Cammie Sickle, MD as Consulting Physician (Hematology and Oncology)    REASON FOR CONSULTATION: Palliative Care consult requested for this 81 y.o. male with multiple medical problems including stage IV colon cancer.  Patient was referred to palliative care to help address goals and manage ongoing symptoms.  SOCIAL HISTORY:     reports that he has been smoking cigarettes. He has a 3.75 pack-year smoking history. He has never used smokeless tobacco. He reports current alcohol use. He reports that he does not use drugs.   Patient is married.  He and his wife live with his stepdaughter.  His wife has advanced dementia and is followed at home by hospice.  Patient has a son and daughter and two twin stepdaughters.  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  ADVANCE DIRECTIVES:  Does not have  CODE STATUS: DNR/DNI (MOST form completed on 11/02/2019)  PAST MEDICAL HISTORY: Past Medical History:  Diagnosis Date   Arthritis    Cancer ([place])    colon cancer 02/2019 per pt    Family history of adverse reaction to anesthesia    cousin took all night to wake up from anesthesia   H/O colon cancer, stage IV    Hyperlipemia    Neuromuscular disorder ([place])    Pre-diabetes     PAST SURGICAL HISTORY:  Past Surgical History:  Procedure Laterality Date   COLON SURGERY     ESOPHAGOGASTRODUODENOSCOPY (EGD) WITH PROPOFOL N/A 12/26/2020   Procedure: ESOPHAGOGASTRODUODENOSCOPY (EGD) WITH PROPOFOL;  Surgeon: [name], MD;  Location: [facility] ENDOSCOPY;  Service: Gastroenterology;  Laterality: N/A;   IR ANGIOGRAM SELECTIVE EACH ADDITIONAL VESSEL  12/19/2020   IR ANGIOGRAM SELECTIVE EACH ADDITIONAL VESSEL  03/24/2021   IR ANGIOGRAM  SELECTIVE EACH ADDITIONAL VESSEL  03/24/2021   IR ANGIOGRAM SELECTIVE EACH ADDITIONAL VESSEL  04/04/2021   IR ANGIOGRAM SELECTIVE EACH ADDITIONAL VESSEL  04/04/2021   IR ANGIOGRAM SELECTIVE EACH ADDITIONAL VESSEL  04/04/2021   IR ANGIOGRAM VISCERAL SELECTIVE  12/19/2020   IR ANGIOGRAM VISCERAL SELECTIVE  03/24/2021   IR ANGIOGRAM VISCERAL SELECTIVE  04/04/2021   IR ANGIOGRAM VISCERAL SELECTIVE  04/04/2021   IR EMBO ARTERIAL NOT HEMORR HEMANG INC GUIDE ROADMAPPING  03/24/2021   IR EMBO TUMOR ORGAN ISCHEMIA INFARCT INC GUIDE ROADMAPPING  12/19/2020   IR EMBO TUMOR ORGAN ISCHEMIA INFARCT INC GUIDE ROADMAPPING  04/04/2021   IR IMAGING GUIDED PORT INSERTION  07/20/2019   IR RADIOLOGIST EVAL & MGMT  12/05/2020   IR RADIOLOGIST EVAL & MGMT  02/11/2021   IR RADIOLOGIST EVAL & MGMT  03/04/2021   IR RADIOLOGIST EVAL & MGMT  04/29/2021   IR RADIOLOGIST EVAL & MGMT  07/22/2021   IR US GUIDE VASC ACCESS RIGHT  12/19/2020   IR US GUIDE VASC ACCESS RIGHT  03/24/2021   IR US GUIDE VASC ACCESS RIGHT  04/04/2021   JOINT REPLACEMENT Left 2010   Knee   RADIOLOGY WITH ANESTHESIA N/A 01/15/2021   Procedure: CT WITH ANESTHESIA MICROWAVE ABLATION OF LIVER;  Surgeon: Criselda Peaches, MD;  Location: [facility] ORS;  Service: Anesthesiology;  Laterality: N/A;    HEMATOLOGY/ONCOLOGY HISTORY:  Oncology History Overview Note  # MAY 2020- 3. 03/09/19 Liver biopsy. Microscopic examination shows malignant cells with glandular architecture consistent with adenocarcinoma.  The malignant cells are positive for CK20 and CDX-2. These findings support the clinical impression of metastatic colon adenocarcinoma. 4. 03/10/19 R hemicolectomy. Tumor site cecum. Adenocarcinoma. Mucinous features present. G2. No tumor deposits. Invades visceral peritoneum. No tumor perforation. LVI present. PNI not identified. All margins uninvolved. 1/12 LNs. PT4apN1. Periappendiceal inflammation c/w resolving abscess. Microsatellite stable (MSS). [Dr.Mettu; [facility]]  # SEP 4th 2020  [compared to May 2020]  Interval increase in size of the metastases to the hepatic dome, The metastasis to the left hepatic lobe is unchanged; 2.  New subcentimeter hypoattenuating lesion in the inferior right hepatic lobe, incompletely characterized on CT. 3.  Postsurgical changes following right hemicolectomy.  # OCT 2020- FOLFOX +avastin; CT dec 22nd 2020- [compared to [facility] sep 9th 2020]-Liver- slight progression versus stable disease; CT scan SEP 4th 2021- Progressive disease-left lower lobe lung nodule 8 mm [previously 4 mm]; increase in size of the hepatic metastases by few millimeters.;  Soft tissue nodule adjacent to anastomotic site again increased by few millimeters.STOP FOLFOX; cont avastin  #  SEP 20th, 2021- FOLFIRI+ AVASTIN; HOLD FEB 2022- sec to poor tolerance [peptic ulcer]; 3/30-liver ablation-   [bland embolization of segment 2/3 liver lesion on 12/19/2020;  microwave ablation of both liver lesions.  # ?July 25th, 2022- BEV _ TAS 102.MAY 2022- s/p Liver ablation  # DEC 2022-progressive disease-lung and liver/abdomen; START Regorefenib.   # FEB /16-EGD [Dr.Anna-duodenal ulcer-? meloxicam]  # JAN 2022- COVID [s/p Mab infusion; pills; skin rash resolved.]  # NGS/F-ONE-MUTATED K-RAS [G]  # PALLIATIVE CARE EVALUATION: 09/20/2019-[name]  # PAIN MANAGEMENT: NA   DIAGNOSIS: COLON CANCER  STAGE:  IV     ;  GOALS:Palliative      Cancer of right colon ([place])  07/05/2019 Initial Diagnosis   Cancer of right colon ([place])   07/24/2019 - 06/25/2020 Chemotherapy   The patient had dexamethasone (DECADRON) 4 MG tablet, 8 mg, Oral, Daily, 1 of 1 cycle, Start date: --, End date: -- palonosetron (ALOXI) injection 0.25 mg, 0.25 mg, Intravenous,  Once, 23 of 25 cycles Administration: 0.25 mg (07/24/2019), 0.25 mg (08/07/2019), 0.25 mg (08/21/2019), 0.25 mg (09/04/2019), 0.25 mg (09/20/2019), 0.25 mg (10/04/2019), 0.25 mg (10/16/2019), 0.25 mg (10/30/2019), 0.25 mg (11/22/2019), 0.25 mg (12/06/2019),  0.25 mg (12/20/2019), 0.25 mg (01/03/2020), 0.25 mg (01/29/2020), 0.25 mg (02/12/2020), 0.25 mg (02/26/2020), 0.25 mg (03/11/2020), 0.25 mg (03/25/2020), 0.25 mg (04/08/2020), 0.25 mg (04/24/2020), 0.25 mg (05/08/2020), 0.25 mg (05/22/2020), 0.25 mg (06/10/2020), 0.25 mg (06/25/2020) leucovorin 800 mg in dextrose 5 % 250 mL infusion, 844 mg, Intravenous,  Once, 23 of 25 cycles Administration: 800 mg (07/24/2019), 800 mg (08/07/2019), 800 mg (08/21/2019), 800 mg (09/04/2019), 800 mg (09/20/2019), 800 mg (10/04/2019), 800 mg (10/16/2019), 800 mg (10/30/2019), 800 mg (11/22/2019), 800 mg (12/06/2019), 800 mg (12/20/2019), 800 mg (01/03/2020), 800 mg (01/29/2020), 800 mg (02/12/2020), 800 mg (02/26/2020), 800 mg (03/11/2020), 800 mg (03/25/2020), 800 mg (04/08/2020), 800 mg (04/24/2020), 800 mg (05/08/2020), 800 mg (05/22/2020), 800 mg (06/10/2020), 800 mg (06/25/2020) oxaliplatin (ELOXATIN) 180 mg in dextrose 5 % 500 mL chemo infusion, 85 mg/m2 = 180 mg, Intravenous,  Once, 23 of 25 cycles Dose modification: 178 mg (original dose 85 mg/m2, Cycle 17, Reason: Other (see comments), Comment: insurance adjusted dose ) Administration: 180 mg (07/24/2019), 180 mg (08/07/2019), 180 mg (08/21/2019), 180 mg (09/04/2019), 180 mg (09/20/2019), 180 mg (10/04/2019), 180 mg (10/16/2019), 180 mg (10/30/2019), 180 mg (11/22/2019), 180 mg (12/06/2019), 180 mg (12/20/2019), 180 mg (01/03/2020), 180 mg (01/29/2020), 180 mg (02/12/2020),  180 mg (02/26/2020), 180 mg (03/11/2020), 180 mg (04/08/2020), 180 mg (04/24/2020), 180 mg (05/08/2020), 180 mg (05/22/2020), 180 mg (06/10/2020), 180 mg (06/25/2020) fluorouracil (ADRUCIL) 5,000 mg in sodium chloride 0.9 % 150 mL chemo infusion, 5,050 mg, Intravenous, 1 Day/Dose, 23 of 25 cycles Administration: 5,000 mg (07/24/2019), 5,000 mg (08/07/2019), 5,000 mg (08/21/2019), 5,050 mg (09/04/2019), 5,000 mg (10/04/2019), 5,000 mg (10/16/2019), 5,000 mg (11/22/2019), 5,000 mg (12/06/2019), 5,000 mg (12/20/2019), 5,000 mg (01/03/2020), 5,000 mg (01/29/2020), 5,000 mg  (02/12/2020), 5,000 mg (02/26/2020), 5,000 mg (03/11/2020), 5,000 mg (03/25/2020), 5,000 mg (04/08/2020), 5,000 mg (04/24/2020), 5,000 mg (05/08/2020), 5,000 mg (05/22/2020), 5,000 mg (06/10/2020), 5,000 mg (06/25/2020) bevacizumab-bvzr (ZIRABEV) 400 mg in sodium chloride 0.9 % 100 mL chemo infusion, 5 mg/kg = 400 mg, Intravenous,  Once, 23 of 25 cycles Administration: 400 mg (08/07/2019), 400 mg (08/21/2019), 400 mg (09/04/2019), 400 mg (09/20/2019), 400 mg (10/04/2019), 400 mg (10/16/2019), 400 mg (10/30/2019), 400 mg (11/22/2019), 400 mg (12/06/2019), 400 mg (12/20/2019), 400 mg (01/03/2020), 400 mg (01/29/2020), 400 mg (02/12/2020), 400 mg (02/26/2020), 400 mg (03/11/2020), 400 mg (03/25/2020), 400 mg (04/08/2020), 400 mg (04/24/2020), 400 mg (05/08/2020), 400 mg (05/22/2020), 400 mg (06/10/2020), 400 mg (06/25/2020)  for chemotherapy treatment.    07/08/2020 -  Chemotherapy   Patient is on Treatment Plan : COLORECTAL FOLFIRI / BEVACIZUMAB Q14D     11/05/2020 Cancer Staging   Staging form: Colon and Rectum, AJCC 8th Edition - Clinical: Stage IVC (pM1c) - Signed by Cammie Sickle, MD on 11/05/2020     ALLERGIES:  is allergic to ace inhibitors.  MEDICATIONS:  Current Outpatient Medications  Medication Sig Dispense Refill   amLODipine (NORVASC) 5 MG tablet Take 5 mg by mouth daily. (Patient not taking: Reported on 10/03/2021)     ondansetron (ZOFRAN) 4 MG tablet Take 1 tablet (4 mg total) by mouth every 8 (eight) hours as needed for nausea or vomiting. (Patient not taking: Reported on 02/24/2021) 20 tablet 0   ondansetron (ZOFRAN) 8 MG tablet Take 1 tablet (8 mg total) by mouth every 8 (eight) hours as needed for nausea or vomiting. (Patient not taking: Reported on 02/24/2021) 20 tablet 0   oxyCODONE (OXY IR/ROXICODONE) 5 MG immediate release tablet Take 1 tablet (5 mg total) by mouth every 6 (six) hours as needed for severe pain. 60 tablet 0   pravastatin (PRAVACHOL) 40 MG tablet Take 40 mg by mouth daily.  (Patient not taking:  Reported on 06/09/2021)     prochlorperazine (COMPAZINE) 10 MG tablet Take 1 tablet (10 mg total) by mouth every 6 (six) hours as needed for nausea or vomiting. (Patient not taking: Reported on 01/28/2021) 60 tablet 0   regorafenib (STIVARGA) 40 MG tablet Take 2 tablets (5m) by mouth daily for 7 days, then 3 tablets (123m daily for 7 days, then 4 tablets (16076mdaily for 7 days, then hold for 7 days.Take with low fat meal (breakfast). (Patient not taking: Reported on 11/18/2021) 63 tablet 0   sucralfate (CARAFATE) 1 g tablet Take 1 tablet (1 g total) by mouth 4 (four) times daily -  with meals and at bedtime. (Patient not taking: Reported on 11/18/2021) 90 tablet 3   tamsulosin (FLOMAX) 0.4 MG CAPS capsule Take 1 capsule (0.4 mg total) by mouth daily. 30 capsule 11   No current facility-administered medications for this visit.    VITAL SIGNS: There were no vitals taken for this visit. There were no vitals filed for this visit.  Estimated body mass index is 22.81  kg/m as calculated from the following:   Height as of 10/28/21: 6' (1.829 m).   Weight as of an earlier encounter on 11/18/21: 168 lb 3.2 oz (76.3 kg).  LABS: CBC:    Component Value Date/Time   WBC 5.5 11/18/2021 1420   HGB 11.4 (L) 11/18/2021 1420   HGB 13.3 09/18/2013 0424   HCT 34.6 (L) 11/18/2021 1420   HCT 38.1 (L) 09/18/2013 0424   PLT 197 11/18/2021 1420   PLT 307 09/18/2013 0424   MCV 110.2 (H) 11/18/2021 1420   MCV 94 09/18/2013 0424   NEUTROABS 3.6 11/18/2021 1420   NEUTROABS 17.0 (H) 09/18/2013 0424   LYMPHSABS 0.9 11/18/2021 1420   LYMPHSABS 0.9 (L) 09/18/2013 0424   MONOABS 0.6 11/18/2021 1420   MONOABS 0.4 09/18/2013 0424   EOSABS 0.3 11/18/2021 1420   EOSABS 0.0 09/18/2013 0424   BASOSABS 0.0 11/18/2021 1420   BASOSABS 0.0 09/18/2013 0424   Comprehensive Metabolic Panel:    Component Value Date/Time   NA 139 11/18/2021 1420   NA 137 09/18/2013 0424   K 3.5 11/18/2021 1420   K 4.5 09/18/2013 0424    CL 103 11/18/2021 1420   CL 107 09/18/2013 0424   CO2 28 11/18/2021 1420   CO2 28 09/18/2013 0424   BUN 9 11/18/2021 1420   BUN 19 (H) 09/18/2013 0424   CREATININE 0.87 11/18/2021 1420   CREATININE 1.15 09/18/2013 0424   GLUCOSE 117 (H) 11/18/2021 1420   GLUCOSE 196 (H) 09/18/2013 0424   CALCIUM 8.6 (L) 11/18/2021 1420   CALCIUM 9.1 09/18/2013 0424   AST 36 11/18/2021 1420   ALT 17 11/18/2021 1420   ALKPHOS 186 (H) 11/18/2021 1420   BILITOT 0.3 11/18/2021 1420   PROT 8.4 (H) 11/18/2021 1420   ALBUMIN 2.7 (L) 11/18/2021 1420    RADIOGRAPHIC STUDIES: MR ABDOMEN WWO CONTRAST  Result Date: 11/15/2021 CLINICAL DATA:  Metastatic colon cancer. History of right hemicolectomy. History of bland embolization of liver metastases 12/19/2020, microwave ablation of left liver metastasis 01/15/2021 and Y-90 radioembolization of segments 6 and 8 right liver lesions 04/04/2021. Restaging. EXAM: MRI ABDOMEN WITHOUT AND WITH CONTRAST TECHNIQUE: Multiplanar multisequence MR imaging of the abdomen was performed both before and after the administration of intravenous contrast. CONTRAST:  7.47m GADAVIST GADOBUTROL 1 MMOL/ML IV SOLN COMPARISON:  07/18/2021 MRI abdomen. 10/06/2021 CT chest, abdomen and pelvis. FINDINGS: Lower chest: Moderate left pleural effusion is similar to prior MRI. Multifocal bulky left pleural masses are not substantially changed. Representative 6.2 x 3.1 cm basilar left pleural mass (series 4/image 17), previously 6.0 x 3.0 cm on 07/18/2021 MRI. Representative 3.7 x 2.5 cm basilar left pleural mass (series 4/image 19), previously 3.6 x 2.5 cm. Representative 3.1 x 2.3 cm posterior left pleural mass (series 4/image 7), previously 3.0 x 2.5 cm. Multiple scattered pulmonary nodules at both lung bases, new and increased since 07/18/2021 MRI and probably increased since 10/06/2021 chest CT, for example a 1.3 cm basilar right lower lobe pulmonary nodule (series 4/image 6), previously 0.8 cm on  10/06/2021 chest CT and new since 07/18/2021 MRI. Hepatobiliary: Numerous (at least 8) small hypoenhancing liver masses scattered throughout the liver are new or increased since 07/18/2021 MRI, the largest measuring 1.0 x 1.0 cm in the segment 7 right liver (series 15/image 21), increased from 0.5 x 0.4 cm on 07/18/2021 MRI. Representative 0.7 x 0.5 cm lesion in the segment 6 right liver (series 15/image 44), increased from 0.3 x 0.3 cm on 07/18/2021 MRI.  Representative new 0.7 x 0.6 cm in the peripheral segment 8 right liver (series 15/image 19). Irregular 8.3 x 7.4 cm segment 8 right liver mass (series 20/image 15), previously 8.6 x 7.4 cm using similar measurement technique, not substantially changed in size, however with increased heterogeneous peripheral enhancement most prominent anteriorly (series 21/image 14). Irregular 8.3 x 6.0 cm anterior left liver mass (series 20/image 27), increased from 6.9 x 4.3 cm, with increased heterogeneous peripheral enhancement on the subtraction sequences. Ablated 2.3 x 2.0 cm segment 6 right liver mass (series 20/image 44), previously 2.3 x 2.2 cm, not appreciably changed, with similar thin relatively uniform mural enhancement. Far inferior right liver 2.3 x 1.9 cm capsular mass with thick irregular peripheral enhancement (series 20/image 55), previously 1.9 x 1.6 cm, mildly increased. No hepatic steatosis. Normal gallbladder with no cholelithiasis. Mild central intrahepatic biliary ductal dilatation is mildly increased from 07/18/2021 MRI. Dilated common bile duct with diameter 14 mm, increased from 9 mm on 07/18/2021 MRI. No evidence of choledocholithiasis. No discrete obstructing biliary mass. Pancreas: No pancreatic mass. Generalized pancreatic duct dilation up to 5 mm diameter, increased from 3 mm on 07/18/2021 MRI. No pancreas divisum. Spleen: Normal size. No mass. Adrenals/Urinary Tract: Heterogeneous enhancing 4.6 x 2.5 cm right adrenal mass (series 13/image 44),  increased from 3.0 x 1.6 cm on 07/18/2021 MRI. No left adrenal mass. No hydronephrosis. Infiltrative anterior upper right renal cortical 4.5 x 4.4 cm heterogeneous enhancing mass (series 13/image 53), increased from 3.6 x 3.1 cm on 07/18/2021 MRI. Exophytic simple 4.7 cm lateral lower left renal cyst. Stomach/Bowel: Normal non-distended stomach. Partially visualized postsurgical changes from subtotal right hemicolectomy with ileocolic anastomosis in the right abdomen. No dilated small or large bowel loops. Vascular/Lymphatic: Atherosclerotic abdominal aorta with stable 3.3 cm infrarenal abdominal aortic aneurysm. The anterior upper right renal cortical mass either increasingly encases or invades the right renal vein. Enlarged necrotic pathologic retroperitoneal lymph nodes have increased since 07/18/2021 MRI. Representative 2.7 cm portacaval node (series 20/image 39), increased from 1.5 cm. Representative 2.3 cm retrocaval node (series 20/image 46), increased from 1.7 cm. Representative 1.6 cm aortocaval node (series 20/image 63), increased from 1.4 cm. Other: Trace perihepatic ascites is similar. No focal fluid collections. Enhancing 1.0 cm right peritoneal soft tissue mass near the inferior right liver (series 20/image 59), increased from 0.6 cm on 07/18/2021 MRI. Enhancing 2.1 cm anterior right peritoneal soft tissue mass (series 20/image 67), increased from 1.4 cm on 07/18/2021 MRI. Heterogeneous enhancing 3.8 x 3.2 cm posterior right peritoneal mass (series 22/image 70), mildly increased from 3.4 x 2.8 cm on 07/18/2021 MRI. Musculoskeletal: No aggressive appearing focal osseous lesions. IMPRESSION: 1. Significant interval progression of widespread metastatic disease since 07/18/2021 MRI. 2. Numerous (at least 8) new or enlarging small liver metastases. Recurrent viable malignancy within previously treated large segment 8 and anterior left liver metastases. 3. Progressive metastatic retroperitoneal  lymphadenopathy. 4. Progressive multifocal right peritoneal metastases. 5. Progressive right adrenal and upper right renal metastases. The upper right renal metastasis either increasingly encases or invades the right renal vein. 6. Progressive pulmonary metastases at the lung bases. 7. Multifocal bulky left pleural metastases and moderate left pleural effusion, not substantially changed. 8. Mild central intrahepatic and more prominent extrahepatic biliary ductal dilatation and mild pancreatic duct dilation, increased from 07/18/2021 MRI. No discrete obstructing biliary mass. No choledocholithiasis. Correlate with serum bilirubin levels and with any opioid analgesic use. 9. Stable trace perihepatic ascites. Electronically Signed   By: Janina Mayo.D.  On: 11/15/2021 16:24     PERFORMANCE STATUS (ECOG) : 1 - Symptomatic but completely ambulatory  Review of Systems Unless otherwise noted, a complete review of systems is negative.  Physical Exam General: NAD, frail appearing, thin Pulmonary: Unlabored Extremities: no edema, no joint deformities Skin: no rashes Neurological: Weakness but otherwise nonfocal  IMPRESSION: Follow-up visit.  Patient saw Dr. Rogue Bussing 10/28/2021.  Decision was made to discontinue TS 102+ Bev given disease progression.  Patient was started on regorafenib (started on 11/04/2021).    MRI on 11/14/2021 revealed significant interval progression of widespread metastatic disease.  Patient saw Dr. Rogue Bussing today with plan to continue Ciro Backer but prognosis is felt to be quite limited, only likely a few months.  In the event of decline or further disease progression, hospice has been discussed as a recommendation.  Patient says that his family are aware.  He had some questions about what to expect as the cancer progresses and he approaches end-of-life.  MOST form previously completed and patient is appropriately a DNR/DNI.  PLAN: -Continue current scope of  treatment -DNR/DNI -RTC 1 month  Case and plan discussed with Dr. Rogue Bussing  Patient expressed understanding and was in agreement with this plan. He also understands that He can call the clinic at any time with any questions, concerns, or complaints.     Time Total: 15 minutes  Visit consisted of counseling and education dealing with the complex and emotionally intense issues of symptom management and palliative care in the setting of serious and potentially life-threatening illness.Greater than 50%  of this time was spent counseling and coordinating care related to the above assessment and plan.  Signed by: Altha Harm, PhD, NP-C

## 2021-11-18 NOTE — Progress Notes (Signed)
[facility] NOTE  Patient Care Team: Sofie Hartigan, MD as PCP - General (Family Medicine) Cammie Sickle, MD as Consulting Physician (Hematology and Oncology)  CHIEF COMPLAINTS/PURPOSE OF CONSULTATION: Colon cancer  #  Oncology History Overview Note  # MAY 2020- 3. 03/09/19 Liver biopsy. Microscopic examination shows malignant cells with glandular architecture consistent with adenocarcinoma. The malignant cells are positive for CK20 and CDX-2. These findings support the clinical impression of metastatic colon adenocarcinoma. 4. 03/10/19 R hemicolectomy. Tumor site cecum. Adenocarcinoma. Mucinous features present. G2. No tumor deposits. Invades visceral peritoneum. No tumor perforation. LVI present. PNI not identified. All margins uninvolved. 1/12 LNs. PT4apN1. Periappendiceal inflammation c/w resolving abscess. Microsatellite stable (MSS). [Dr.Mettu; [facility]]  # SEP 4th 2020 [compared to May 2020]  Interval increase in size of the metastases to the hepatic dome, The metastasis to the left hepatic lobe is unchanged; 2.  New subcentimeter hypoattenuating lesion in the inferior right hepatic lobe, incompletely characterized on CT. 3.  Postsurgical changes following right hemicolectomy.  # OCT 2020- FOLFOX +avastin; CT dec 22nd 2020- [compared to [facility] sep 9th 2020]-Liver- slight progression versus stable disease; CT scan SEP 4th 2021- Progressive disease-left lower lobe lung nodule 8 mm [previously 4 mm]; increase in size of the hepatic metastases by few millimeters.;  Soft tissue nodule adjacent to anastomotic site again increased by few millimeters.STOP FOLFOX; cont avastin  #  SEP 20th, 2021- FOLFIRI+ AVASTIN; HOLD FEB 2022- sec to poor tolerance [peptic ulcer]; 3/30-liver ablation-   [bland embolization of segment 2/3 liver lesion on 12/19/2020;  microwave ablation of both liver lesions.  # ?July 25th, 2022- BEV _ TAS 102.MAY 2022- s/p Liver ablation  # DEC  2022-progressive disease-lung and liver/abdomen; JAN 31st 2023-START Regorefenib.   # FEB /16-EGD [Dr.Anna-duodenal ulcer-? meloxicam]  # JAN 2022- COVID [s/p Mab infusion; pills; skin rash resolved.]  # NGS/F-ONE-MUTATED K-RAS [G]  # PALLIATIVE CARE EVALUATION: 09/20/2019-[name]  # PAIN MANAGEMENT: NA   DIAGNOSIS: COLON CANCER  STAGE:  IV     ;  GOALS:Palliative      Cancer of right colon ([place])  07/05/2019 Initial Diagnosis   Cancer of right colon ([place])   07/24/2019 - 06/25/2020 Chemotherapy   The patient had dexamethasone (DECADRON) 4 MG tablet, 8 mg, Oral, Daily, 1 of 1 cycle, Start date: --, End date: -- palonosetron (ALOXI) injection 0.25 mg, 0.25 mg, Intravenous,  Once, 23 of 25 cycles Administration: 0.25 mg (07/24/2019), 0.25 mg (08/07/2019), 0.25 mg (08/21/2019), 0.25 mg (09/04/2019), 0.25 mg (09/20/2019), 0.25 mg (10/04/2019), 0.25 mg (10/16/2019), 0.25 mg (10/30/2019), 0.25 mg (11/22/2019), 0.25 mg (12/06/2019), 0.25 mg (12/20/2019), 0.25 mg (01/03/2020), 0.25 mg (01/29/2020), 0.25 mg (02/12/2020), 0.25 mg (02/26/2020), 0.25 mg (03/11/2020), 0.25 mg (03/25/2020), 0.25 mg (04/08/2020), 0.25 mg (04/24/2020), 0.25 mg (05/08/2020), 0.25 mg (05/22/2020), 0.25 mg (06/10/2020), 0.25 mg (06/25/2020) leucovorin 800 mg in dextrose 5 % 250 mL infusion, 844 mg, Intravenous,  Once, 23 of 25 cycles Administration: 800 mg (07/24/2019), 800 mg (08/07/2019), 800 mg (08/21/2019), 800 mg (09/04/2019), 800 mg (09/20/2019), 800 mg (10/04/2019), 800 mg (10/16/2019), 800 mg (10/30/2019), 800 mg (11/22/2019), 800 mg (12/06/2019), 800 mg (12/20/2019), 800 mg (01/03/2020), 800 mg (01/29/2020), 800 mg (02/12/2020), 800 mg (02/26/2020), 800 mg (03/11/2020), 800 mg (03/25/2020), 800 mg (04/08/2020), 800 mg (04/24/2020), 800 mg (05/08/2020), 800 mg (05/22/2020), 800 mg (06/10/2020), 800 mg (06/25/2020) oxaliplatin (ELOXATIN) 180 mg in dextrose 5 % 500 mL chemo infusion, 85 mg/m2 = 180 mg, Intravenous,  Once, 23 of  25 cycles Dose modification: 178 mg (original  dose 85 mg/m2, Cycle 17, Reason: Other (see comments), Comment: insurance adjusted dose ) Administration: 180 mg (07/24/2019), 180 mg (08/07/2019), 180 mg (08/21/2019), 180 mg (09/04/2019), 180 mg (09/20/2019), 180 mg (10/04/2019), 180 mg (10/16/2019), 180 mg (10/30/2019), 180 mg (11/22/2019), 180 mg (12/06/2019), 180 mg (12/20/2019), 180 mg (01/03/2020), 180 mg (01/29/2020), 180 mg (02/12/2020), 180 mg (02/26/2020), 180 mg (03/11/2020), 180 mg (04/08/2020), 180 mg (04/24/2020), 180 mg (05/08/2020), 180 mg (05/22/2020), 180 mg (06/10/2020), 180 mg (06/25/2020) fluorouracil (ADRUCIL) 5,000 mg in sodium chloride 0.9 % 150 mL chemo infusion, 5,050 mg, Intravenous, 1 Day/Dose, 23 of 25 cycles Administration: 5,000 mg (07/24/2019), 5,000 mg (08/07/2019), 5,000 mg (08/21/2019), 5,050 mg (09/04/2019), 5,000 mg (10/04/2019), 5,000 mg (10/16/2019), 5,000 mg (11/22/2019), 5,000 mg (12/06/2019), 5,000 mg (12/20/2019), 5,000 mg (01/03/2020), 5,000 mg (01/29/2020), 5,000 mg (02/12/2020), 5,000 mg (02/26/2020), 5,000 mg (03/11/2020), 5,000 mg (03/25/2020), 5,000 mg (04/08/2020), 5,000 mg (04/24/2020), 5,000 mg (05/08/2020), 5,000 mg (05/22/2020), 5,000 mg (06/10/2020), 5,000 mg (06/25/2020) bevacizumab-bvzr (ZIRABEV) 400 mg in sodium chloride 0.9 % 100 mL chemo infusion, 5 mg/kg = 400 mg, Intravenous,  Once, 23 of 25 cycles Administration: 400 mg (08/07/2019), 400 mg (08/21/2019), 400 mg (09/04/2019), 400 mg (09/20/2019), 400 mg (10/04/2019), 400 mg (10/16/2019), 400 mg (10/30/2019), 400 mg (11/22/2019), 400 mg (12/06/2019), 400 mg (12/20/2019), 400 mg (01/03/2020), 400 mg (01/29/2020), 400 mg (02/12/2020), 400 mg (02/26/2020), 400 mg (03/11/2020), 400 mg (03/25/2020), 400 mg (04/08/2020), 400 mg (04/24/2020), 400 mg (05/08/2020), 400 mg (05/22/2020), 400 mg (06/10/2020), 400 mg (06/25/2020)   for chemotherapy treatment.     07/08/2020 -  Chemotherapy   Patient is on Treatment Plan : COLORECTAL FOLFIRI / BEVACIZUMAB Q14D     11/05/2020 Cancer Staging   Staging form: Colon and Rectum,  AJCC 8th Edition - Clinical: Stage IVC (pM1c) - Signed by Cammie Sickle, MD on 11/05/2020      HISTORY OF PRESENTING ILLNESS: Alone.  Ambulating independently.  Oscar [name] 81 y.o.  male with metastatic colon cancer to the liver-s/p multiple lines of therapy is here for follow-up/review results of MRI.  Patient started to work approximately 3 weeks ago.  He took for 2 days-noted to have worsening abdominal pain nausea vomiting.  He was evaluated in the symptom management clinic.  Patient was started on oxycodone.  Patient is taking 5 mg oxycodone up to 3 a day.  States his pain is better controlled.  Nausea vomiting is resolved. No fever no chills.  No sores in the mouth.  No diarrhea.  Review of Systems  Constitutional:  Positive for malaise/fatigue. Negative for chills, diaphoresis and fever.  HENT:  Negative for nosebleeds and sore throat.   Eyes:  Negative for double vision.  Respiratory:  Negative for cough, hemoptysis, sputum production, shortness of breath and wheezing.   Cardiovascular:  Negative for chest pain, palpitations, orthopnea and leg swelling.  Gastrointestinal:  Negative for blood in stool, constipation, heartburn, melena, nausea and vomiting.  Genitourinary:  Negative for dysuria, frequency and urgency.  Musculoskeletal:  Positive for back pain and joint pain.  Skin: Negative.  Negative for itching and rash.  Neurological:  Positive for tingling. Negative for focal weakness, weakness and headaches.  Endo/Heme/Allergies:  Does not bruise/bleed easily.  Psychiatric/Behavioral:  Negative for depression. The patient is not nervous/anxious and does not have insomnia.     MEDICAL HISTORY:  Past Medical History:  Diagnosis Date   Arthritis    Cancer ([place])  colon cancer 02/2019 per pt    Family history of adverse reaction to anesthesia    cousin took all night to wake up from anesthesia   H/O colon cancer, stage IV    Hyperlipemia    Neuromuscular  disorder ([place])    Pre-diabetes     SURGICAL HISTORY: Past Surgical History:  Procedure Laterality Date   COLON SURGERY     ESOPHAGOGASTRODUODENOSCOPY (EGD) WITH PROPOFOL N/A 12/26/2020   Procedure: ESOPHAGOGASTRODUODENOSCOPY (EGD) WITH PROPOFOL;  Surgeon: [name], MD;  Location: [facility] ENDOSCOPY;  Service: Gastroenterology;  Laterality: N/A;   IR ANGIOGRAM SELECTIVE EACH ADDITIONAL VESSEL  12/19/2020   IR ANGIOGRAM SELECTIVE EACH ADDITIONAL VESSEL  03/24/2021   IR ANGIOGRAM SELECTIVE EACH ADDITIONAL VESSEL  03/24/2021   IR ANGIOGRAM SELECTIVE EACH ADDITIONAL VESSEL  04/04/2021   IR ANGIOGRAM SELECTIVE EACH ADDITIONAL VESSEL  04/04/2021   IR ANGIOGRAM SELECTIVE EACH ADDITIONAL VESSEL  04/04/2021   IR ANGIOGRAM VISCERAL SELECTIVE  12/19/2020   IR ANGIOGRAM VISCERAL SELECTIVE  03/24/2021   IR ANGIOGRAM VISCERAL SELECTIVE  04/04/2021   IR ANGIOGRAM VISCERAL SELECTIVE  04/04/2021   IR EMBO ARTERIAL NOT HEMORR HEMANG INC GUIDE ROADMAPPING  03/24/2021   IR EMBO TUMOR ORGAN ISCHEMIA INFARCT INC GUIDE ROADMAPPING  12/19/2020   IR EMBO TUMOR ORGAN ISCHEMIA INFARCT INC GUIDE ROADMAPPING  04/04/2021   IR IMAGING GUIDED PORT INSERTION  07/20/2019   IR RADIOLOGIST EVAL & MGMT  12/05/2020   IR RADIOLOGIST EVAL & MGMT  02/11/2021   IR RADIOLOGIST EVAL & MGMT  03/04/2021   IR RADIOLOGIST EVAL & MGMT  04/29/2021   IR RADIOLOGIST EVAL & MGMT  07/22/2021   IR US GUIDE VASC ACCESS RIGHT  12/19/2020   IR US GUIDE VASC ACCESS RIGHT  03/24/2021   IR US GUIDE VASC ACCESS RIGHT  04/04/2021   JOINT REPLACEMENT Left 2010   Knee   RADIOLOGY WITH ANESTHESIA N/A 01/15/2021   Procedure: CT WITH ANESTHESIA MICROWAVE ABLATION OF LIVER;  Surgeon: Criselda Peaches, MD;  Location: [facility] ORS;  Service: Anesthesiology;  Laterality: N/A;    SOCIAL HISTORY: Social History   Socioeconomic History   Marital status: Married    Spouse name: Not on file   Number of children: Not on file   Years of education: Not on file   Highest education level:  Not on file  Occupational History   Not on file  Tobacco Use   Smoking status: Every Day    Packs/day: 0.25    Years: 15.00    Pack years: 3.75    Types: Cigarettes   Smokeless tobacco: Never   Tobacco comments:    1 to 2 cigarettes a day occasionally  Vaping Use   Vaping Use: Never used  Substance and Sexual Activity   Alcohol use: Yes    Comment: rarely    Drug use: No   Sexual activity: Not on file  Other Topics Concern   Not on file  Social History Narrative   Recruitment consultant retd; lives in [place]; smoking 3cig/day; [3/4 ppd x started at 9 years]; no alcohol. Son & daughter; wife dementia [waiting for placement].    Social Determinants of Health   Financial Resource Strain: Not on file  Food Insecurity: Not on file  Transportation Needs: Not on file  Physical Activity: Not on file  Stress: Not on file  Social Connections: Not on file  Intimate Partner Violence: Not on file    FAMILY HISTORY: Family History  Problem Relation Age  of Onset   Peptic Ulcer Disease Father     ALLERGIES:  is allergic to ace inhibitors.  MEDICATIONS:  Current Outpatient Medications  Medication Sig Dispense Refill   oxyCODONE (OXY IR/ROXICODONE) 5 MG immediate release tablet Take 1 tablet (5 mg total) by mouth every 6 (six) hours as needed for severe pain. 60 tablet 0   tamsulosin (FLOMAX) 0.4 MG CAPS capsule Take 1 capsule (0.4 mg total) by mouth daily. 30 capsule 11   amLODipine (NORVASC) 5 MG tablet Take 5 mg by mouth daily. (Patient not taking: Reported on 10/03/2021)     ondansetron (ZOFRAN) 4 MG tablet Take 1 tablet (4 mg total) by mouth every 8 (eight) hours as needed for nausea or vomiting. (Patient not taking: Reported on 02/24/2021) 20 tablet 0   ondansetron (ZOFRAN) 8 MG tablet Take 1 tablet (8 mg total) by mouth every 8 (eight) hours as needed for nausea or vomiting. (Patient not taking: Reported on 02/24/2021) 20 tablet 0   pravastatin (PRAVACHOL) 40 MG tablet  Take 40 mg by mouth daily.  (Patient not taking: Reported on 06/09/2021)     prochlorperazine (COMPAZINE) 10 MG tablet Take 1 tablet (10 mg total) by mouth every 6 (six) hours as needed for nausea or vomiting. (Patient not taking: Reported on 01/28/2021) 60 tablet 0   regorafenib (STIVARGA) 40 MG tablet Take 2 tablets ($RemoveBe'80mg'NxLnaMkdk$ ) by mouth daily for 7 days, then 3 tablets ($RemoveBe'120mg'xZIokODsa$ ) daily for 7 days, then 4 tablets ($RemoveBe'160mg'JsxcIcdxZ$ ) daily for 7 days, then hold for 7 days.Take with low fat meal (breakfast). (Patient not taking: Reported on 11/18/2021) 63 tablet 0   sucralfate (CARAFATE) 1 g tablet Take 1 tablet (1 g total) by mouth 4 (four) times daily -  with meals and at bedtime. (Patient not taking: Reported on 11/18/2021) 90 tablet 3   No current facility-administered medications for this visit.      Marland Kitchen  PHYSICAL EXAMINATION: ECOG PERFORMANCE STATUS: 0 - Asymptomatic  Vitals:   11/18/21 1442  BP: (!) 150/81  Pulse: 60  Resp: 18  Temp: (!) 97.5 F (36.4 C)   Filed Weights   11/18/21 1442  Weight: 168 lb 3.2 oz (76.3 kg)    Physical Exam HENT:     Head: Normocephalic and atraumatic.     Mouth/Throat:     Pharynx: No oropharyngeal exudate.  Eyes:     Pupils: Pupils are equal, round, and reactive to light.  Cardiovascular:     Rate and Rhythm: Normal rate and regular rhythm.  Pulmonary:     Effort: No respiratory distress.     Breath sounds: No wheezing.     Comments: Decreased breath sounds bilaterally.  No wheeze or crackles Abdominal:     General: Bowel sounds are normal. There is no distension.     Palpations: Abdomen is soft. There is no mass.     Tenderness: There is no abdominal tenderness. There is no guarding or rebound.  Musculoskeletal:        General: No tenderness. Normal range of motion.     Cervical back: Normal range of motion and neck supple.  Skin:    General: Skin is warm.  Neurological:     Mental Status: He is alert and oriented to person, place, and time.  Psychiatric:         Mood and Affect: Affect normal.   LABORATORY DATA:  I have reviewed the data as listed Lab Results  Component Value Date   WBC 5.5 11/18/2021  HGB 11.4 (L) 11/18/2021   HCT 34.6 (L) 11/18/2021   MCV 110.2 (H) 11/18/2021   PLT 197 11/18/2021   Recent Labs    10/28/21 1014 11/10/21 1053 11/18/21 1420  NA 133* 133* 139  K 3.8 3.4* 3.5  CL 104 103 103  CO2 $Re'24 22 28  'uYS$ GLUCOSE 225* 288* 117*  BUN $Re'11 15 9  'vqc$ CREATININE 0.84 0.94 0.87  CALCIUM 7.9* 8.0* 8.6*  GFRNONAA >60 >60 >60  PROT 7.5 8.0 8.4*  ALBUMIN 2.9* 2.6* 2.7*  AST 35 40 36  ALT $Re'17 18 17  'cdx$ ALKPHOS 155* 170* 186*  BILITOT 0.9 1.2 0.3    RADIOGRAPHIC STUDIES: I have personally reviewed the radiological images as listed and agreed with the findings in the report. MR ABDOMEN WWO CONTRAST  Result Date: 11/15/2021 CLINICAL DATA:  Metastatic colon cancer. History of right hemicolectomy. History of bland embolization of liver metastases 12/19/2020, microwave ablation of left liver metastasis 01/15/2021 and Y-90 radioembolization of segments 6 and 8 right liver lesions 04/04/2021. Restaging. EXAM: MRI ABDOMEN WITHOUT AND WITH CONTRAST TECHNIQUE: Multiplanar multisequence MR imaging of the abdomen was performed both before and after the administration of intravenous contrast. CONTRAST:  7.5mL GADAVIST GADOBUTROL 1 MMOL/ML IV SOLN COMPARISON:  07/18/2021 MRI abdomen. 10/06/2021 CT chest, abdomen and pelvis. FINDINGS: Lower chest: Moderate left pleural effusion is similar to prior MRI. Multifocal bulky left pleural masses are not substantially changed. Representative 6.2 x 3.1 cm basilar left pleural mass (series 4/image 17), previously 6.0 x 3.0 cm on 07/18/2021 MRI. Representative 3.7 x 2.5 cm basilar left pleural mass (series 4/image 19), previously 3.6 x 2.5 cm. Representative 3.1 x 2.3 cm posterior left pleural mass (series 4/image 7), previously 3.0 x 2.5 cm. Multiple scattered pulmonary nodules at both lung bases, new and  increased since 07/18/2021 MRI and probably increased since 10/06/2021 chest CT, for example a 1.3 cm basilar right lower lobe pulmonary nodule (series 4/image 6), previously 0.8 cm on 10/06/2021 chest CT and new since 07/18/2021 MRI. Hepatobiliary: Numerous (at least 8) small hypoenhancing liver masses scattered throughout the liver are new or increased since 07/18/2021 MRI, the largest measuring 1.0 x 1.0 cm in the segment 7 right liver (series 15/image 21), increased from 0.5 x 0.4 cm on 07/18/2021 MRI. Representative 0.7 x 0.5 cm lesion in the segment 6 right liver (series 15/image 44), increased from 0.3 x 0.3 cm on 07/18/2021 MRI. Representative new 0.7 x 0.6 cm in the peripheral segment 8 right liver (series 15/image 19). Irregular 8.3 x 7.4 cm segment 8 right liver mass (series 20/image 15), previously 8.6 x 7.4 cm using similar measurement technique, not substantially changed in size, however with increased heterogeneous peripheral enhancement most prominent anteriorly (series 21/image 14). Irregular 8.3 x 6.0 cm anterior left liver mass (series 20/image 27), increased from 6.9 x 4.3 cm, with increased heterogeneous peripheral enhancement on the subtraction sequences. Ablated 2.3 x 2.0 cm segment 6 right liver mass (series 20/image 44), previously 2.3 x 2.2 cm, not appreciably changed, with similar thin relatively uniform mural enhancement. Far inferior right liver 2.3 x 1.9 cm capsular mass with thick irregular peripheral enhancement (series 20/image 55), previously 1.9 x 1.6 cm, mildly increased. No hepatic steatosis. Normal gallbladder with no cholelithiasis. Mild central intrahepatic biliary ductal dilatation is mildly increased from 07/18/2021 MRI. Dilated common bile duct with diameter 14 mm, increased from 9 mm on 07/18/2021 MRI. No evidence of choledocholithiasis. No discrete obstructing biliary mass. Pancreas: No pancreatic mass. Generalized pancreatic  duct dilation up to 5 mm diameter, increased  from 3 mm on 07/18/2021 MRI. No pancreas divisum. Spleen: Normal size. No mass. Adrenals/Urinary Tract: Heterogeneous enhancing 4.6 x 2.5 cm right adrenal mass (series 13/image 44), increased from 3.0 x 1.6 cm on 07/18/2021 MRI. No left adrenal mass. No hydronephrosis. Infiltrative anterior upper right renal cortical 4.5 x 4.4 cm heterogeneous enhancing mass (series 13/image 53), increased from 3.6 x 3.1 cm on 07/18/2021 MRI. Exophytic simple 4.7 cm lateral lower left renal cyst. Stomach/Bowel: Normal non-distended stomach. Partially visualized postsurgical changes from subtotal right hemicolectomy with ileocolic anastomosis in the right abdomen. No dilated small or large bowel loops. Vascular/Lymphatic: Atherosclerotic abdominal aorta with stable 3.3 cm infrarenal abdominal aortic aneurysm. The anterior upper right renal cortical mass either increasingly encases or invades the right renal vein. Enlarged necrotic pathologic retroperitoneal lymph nodes have increased since 07/18/2021 MRI. Representative 2.7 cm portacaval node (series 20/image 39), increased from 1.5 cm. Representative 2.3 cm retrocaval node (series 20/image 46), increased from 1.7 cm. Representative 1.6 cm aortocaval node (series 20/image 63), increased from 1.4 cm. Other: Trace perihepatic ascites is similar. No focal fluid collections. Enhancing 1.0 cm right peritoneal soft tissue mass near the inferior right liver (series 20/image 59), increased from 0.6 cm on 07/18/2021 MRI. Enhancing 2.1 cm anterior right peritoneal soft tissue mass (series 20/image 67), increased from 1.4 cm on 07/18/2021 MRI. Heterogeneous enhancing 3.8 x 3.2 cm posterior right peritoneal mass (series 22/image 70), mildly increased from 3.4 x 2.8 cm on 07/18/2021 MRI. Musculoskeletal: No aggressive appearing focal osseous lesions. IMPRESSION: 1. Significant interval progression of widespread metastatic disease since 07/18/2021 MRI. 2. Numerous (at least 8) new or enlarging  small liver metastases. Recurrent viable malignancy within previously treated large segment 8 and anterior left liver metastases. 3. Progressive metastatic retroperitoneal lymphadenopathy. 4. Progressive multifocal right peritoneal metastases. 5. Progressive right adrenal and upper right renal metastases. The upper right renal metastasis either increasingly encases or invades the right renal vein. 6. Progressive pulmonary metastases at the lung bases. 7. Multifocal bulky left pleural metastases and moderate left pleural effusion, not substantially changed. 8. Mild central intrahepatic and more prominent extrahepatic biliary ductal dilatation and mild pancreatic duct dilation, increased from 07/18/2021 MRI. No discrete obstructing biliary mass. No choledocholithiasis. Correlate with serum bilirubin levels and with any opioid analgesic use. 9. Stable trace perihepatic ascites. Electronically Signed   By: Ilona Sorrel M.D.   On: 11/15/2021 16:24     ASSESSMENT & PLAN:   Cancer of right colon ([place]) #Right-sided colon adenocarcinoma-with synchronous metastasis to liver/unresectabls;  Currently on oral TAS-102 (*35 mg/m2 twice daily on days 1-5 and 8-12 every 28 days]+ BEV q 2 W. CT scan dec 2022-progression of disease noted with increasing lung nodules/left moderate pleural effusion; new liver lesion; soft tissue implant anterior to the right kidney ~ 5cm in size. MRI JAN 2023- Numerous (at least 8) new or enlarging small liver metastases; Recurrent viable malignancy within previously treated large segment 8 and anterior left liver metastases;  Progressive metastatic retroperitoneal lymphadenopathy;  Progressive multifocal right peritoneal metastases; Progressive right adrenal and upper right renal metastases. The upper right renal metastasis either increasingly encases or invades the right renal vein; Progressive pulmonary metastases at the lung bases;  Multifocal bulky left pleural metastases and moderate  left pleural effusion, not substantially changed.  #I discussed option of discontinuation of Stivarga given patient's poor tolerance [see below] vs-restarting Stivarga at 2 pills a day without any dose escalation.  Patient  interested in restarting Stivarga at this time.  Discussed that response rates are quite modest; I think is reasonable to discontinue if patient is having too many side effects.  Monitor for side effects closely.  Discussed with Ebony Hail; patient s/p evaluation with pharmacy.  # Right upper quadrant pain-secondary to malignancy-s/p evaluation with [name] palliative care; continue oxycodone 5 mg up to 3 pills a day as needed.  Patient can call for refills.  Discussed with [name].  # Mediport- functioning-STABLE>  #Prognosis: I again reviewed with the patient the prognosis unfortunately poor given the progressive disease s/p multiple lines of therapy.  Discussed that life expectancy is in the order of few-many months depending upon response to therapy or no reponse to therapy.   # DISPOSITION:   # follow up in 3 weeks-  MD; labs- cbc/cmp CEA--Dr.B       All questions were answered. The patient knows to call the clinic with any problems, questions or concerns.    Cammie Sickle, MD 11/18/2021 3:45 PM

## 2021-11-18 NOTE — Patient Instructions (Signed)
#  Start taking 2 pills a day; do not increase the dose until the next visit.

## 2021-11-18 NOTE — Progress Notes (Signed)
[place]  Telephone:([phone] [phone]  Patient Care Team: Sofie Hartigan, MD as PCP - General (Family Medicine) Cammie Sickle, MD as Consulting Physician (Hematology and Oncology)   Name of the patient: Oscar [name]  [id number]  1941/05/25   Date of visit: 11/18/21  HPI: Patient is a 81 y.o. male with progressive metastatic colon adenocarcinoma. Planned treatment with Stivarga (regorafenib).  Reason for Consult: Oral chemotherapy follow-up for regorafenib therapy.   PAST MEDICAL HISTORY: Past Medical History:  Diagnosis Date   Arthritis    Cancer ([facility])    colon cancer 02/2019 per pt    Family history of adverse reaction to anesthesia    cousin took all night to wake up from anesthesia   H/O colon cancer, stage IV    Hyperlipemia    Neuromuscular disorder [facility])    Pre-diabetes     HEMATOLOGY/ONCOLOGY HISTORY:  Oncology History Overview Note  # MAY 2020- 3. 03/09/19 Liver biopsy. Microscopic examination shows malignant cells with glandular architecture consistent with adenocarcinoma. The malignant cells are positive for CK20 and CDX-2. These findings support the clinical impression of metastatic colon adenocarcinoma. 4. 03/10/19 R hemicolectomy. Tumor site cecum. Adenocarcinoma. Mucinous features present. G2. No tumor deposits. Invades visceral peritoneum. No tumor perforation. LVI present. PNI not identified. All margins uninvolved. 1/12 LNs. PT4apN1. Periappendiceal inflammation c/w resolving abscess. Microsatellite stable (MSS). [Dr.Mettu; [facility]]  # SEP 4th 2020 [compared to May 2020]  Interval increase in size of the metastases to the hepatic dome, The metastasis to the left hepatic lobe is unchanged; 2.  New subcentimeter hypoattenuating lesion in the inferior right hepatic lobe, incompletely characterized on CT. 3.  Postsurgical changes following right hemicolectomy.  # OCT 2020- FOLFOX +avastin; CT dec  22nd 2020- [compared to [facility] sep 9th 2020]-Liver- slight progression versus stable disease; CT scan SEP 4th 2021- Progressive disease-left lower lobe lung nodule 8 mm [previously 4 mm]; increase in size of the hepatic metastases by few millimeters.;  Soft tissue nodule adjacent to anastomotic site again increased by few millimeters.STOP FOLFOX; cont avastin  #  SEP 20th, 2021- FOLFIRI+ AVASTIN; HOLD FEB 2022- sec to poor tolerance [peptic ulcer]; 3/30-liver ablation-   [bland embolization of segment 2/3 liver lesion on 12/19/2020;  microwave ablation of both liver lesions.  # ?July 25th, 2022- BEV _ TAS 102.MAY 2022- s/p Liver ablation  # DEC 2022-progressive disease-lung and liver/abdomen; JAN 31st 2023-START Regorefenib.   # FEB /16-EGD [Dr.Anna-duodenal ulcer-? meloxicam]  # JAN 2022- COVID [s/p Mab infusion; pills; skin rash resolved.]  # NGS/F-ONE-MUTATED K-RAS [G]  # PALLIATIVE CARE EVALUATION: 09/20/2019-[name]  # PAIN MANAGEMENT: NA   DIAGNOSIS: COLON CANCER  STAGE:  IV     ;  GOALS:Palliative      Cancer of right colon ([place])  07/05/2019 Initial Diagnosis   Cancer of right colon ([place])   07/24/2019 - 06/25/2020 Chemotherapy   The patient had dexamethasone (DECADRON) 4 MG tablet, 8 mg, Oral, Daily, 1 of 1 cycle, Start date: --, End date: -- palonosetron (ALOXI) injection 0.25 mg, 0.25 mg, Intravenous,  Once, 23 of 25 cycles Administration: 0.25 mg (07/24/2019), 0.25 mg (08/07/2019), 0.25 mg (08/21/2019), 0.25 mg (09/04/2019), 0.25 mg (09/20/2019), 0.25 mg (10/04/2019), 0.25 mg (10/16/2019), 0.25 mg (10/30/2019), 0.25 mg (11/22/2019), 0.25 mg (12/06/2019), 0.25 mg (12/20/2019), 0.25 mg (01/03/2020), 0.25 mg (01/29/2020), 0.25 mg (02/12/2020), 0.25 mg (02/26/2020), 0.25 mg (03/11/2020), 0.25 mg (03/25/2020), 0.25 mg (04/08/2020), 0.25 mg (04/24/2020), 0.25 mg (05/08/2020), 0.25  mg (05/22/2020), 0.25 mg (06/10/2020), 0.25 mg (06/25/2020) leucovorin 800 mg in dextrose 5 % 250 mL infusion, 844 mg, Intravenous,   Once, 23 of 25 cycles Administration: 800 mg (07/24/2019), 800 mg (08/07/2019), 800 mg (08/21/2019), 800 mg (09/04/2019), 800 mg (09/20/2019), 800 mg (10/04/2019), 800 mg (10/16/2019), 800 mg (10/30/2019), 800 mg (11/22/2019), 800 mg (12/06/2019), 800 mg (12/20/2019), 800 mg (01/03/2020), 800 mg (01/29/2020), 800 mg (02/12/2020), 800 mg (02/26/2020), 800 mg (03/11/2020), 800 mg (03/25/2020), 800 mg (04/08/2020), 800 mg (04/24/2020), 800 mg (05/08/2020), 800 mg (05/22/2020), 800 mg (06/10/2020), 800 mg (06/25/2020) oxaliplatin (ELOXATIN) 180 mg in dextrose 5 % 500 mL chemo infusion, 85 mg/m2 = 180 mg, Intravenous,  Once, 23 of 25 cycles Dose modification: 178 mg (original dose 85 mg/m2, Cycle 17, Reason: Other (see comments), Comment: insurance adjusted dose ) Administration: 180 mg (07/24/2019), 180 mg (08/07/2019), 180 mg (08/21/2019), 180 mg (09/04/2019), 180 mg (09/20/2019), 180 mg (10/04/2019), 180 mg (10/16/2019), 180 mg (10/30/2019), 180 mg (11/22/2019), 180 mg (12/06/2019), 180 mg (12/20/2019), 180 mg (01/03/2020), 180 mg (01/29/2020), 180 mg (02/12/2020), 180 mg (02/26/2020), 180 mg (03/11/2020), 180 mg (04/08/2020), 180 mg (04/24/2020), 180 mg (05/08/2020), 180 mg (05/22/2020), 180 mg (06/10/2020), 180 mg (06/25/2020) fluorouracil (ADRUCIL) 5,000 mg in sodium chloride 0.9 % 150 mL chemo infusion, 5,050 mg, Intravenous, 1 Day/Dose, 23 of 25 cycles Administration: 5,000 mg (07/24/2019), 5,000 mg (08/07/2019), 5,000 mg (08/21/2019), 5,050 mg (09/04/2019), 5,000 mg (10/04/2019), 5,000 mg (10/16/2019), 5,000 mg (11/22/2019), 5,000 mg (12/06/2019), 5,000 mg (12/20/2019), 5,000 mg (01/03/2020), 5,000 mg (01/29/2020), 5,000 mg (02/12/2020), 5,000 mg (02/26/2020), 5,000 mg (03/11/2020), 5,000 mg (03/25/2020), 5,000 mg (04/08/2020), 5,000 mg (04/24/2020), 5,000 mg (05/08/2020), 5,000 mg (05/22/2020), 5,000 mg (06/10/2020), 5,000 mg (06/25/2020) bevacizumab-bvzr (ZIRABEV) 400 mg in sodium chloride 0.9 % 100 mL chemo infusion, 5 mg/kg = 400 mg, Intravenous,  Once, 23 of 25  cycles Administration: 400 mg (08/07/2019), 400 mg (08/21/2019), 400 mg (09/04/2019), 400 mg (09/20/2019), 400 mg (10/04/2019), 400 mg (10/16/2019), 400 mg (10/30/2019), 400 mg (11/22/2019), 400 mg (12/06/2019), 400 mg (12/20/2019), 400 mg (01/03/2020), 400 mg (01/29/2020), 400 mg (02/12/2020), 400 mg (02/26/2020), 400 mg (03/11/2020), 400 mg (03/25/2020), 400 mg (04/08/2020), 400 mg (04/24/2020), 400 mg (05/08/2020), 400 mg (05/22/2020), 400 mg (06/10/2020), 400 mg (06/25/2020)   for chemotherapy treatment.     07/08/2020 -  Chemotherapy   Patient is on Treatment Plan : COLORECTAL FOLFIRI / BEVACIZUMAB Q14D     11/05/2020 Cancer Staging   Staging form: Colon and Rectum, AJCC 8th Edition - Clinical: Stage IVC (pM1c) - Signed by Cammie Sickle, MD on 11/05/2020      ALLERGIES:  is allergic to ace inhibitors.  MEDICATIONS:  Current Outpatient Medications  Medication Sig Dispense Refill   amLODipine (NORVASC) 5 MG tablet Take 5 mg by mouth daily. (Patient not taking: Reported on 10/03/2021)     ondansetron (ZOFRAN) 4 MG tablet Take 1 tablet (4 mg total) by mouth every 8 (eight) hours as needed for nausea or vomiting. (Patient not taking: Reported on 02/24/2021) 20 tablet 0   ondansetron (ZOFRAN) 8 MG tablet Take 1 tablet (8 mg total) by mouth every 8 (eight) hours as needed for nausea or vomiting. (Patient not taking: Reported on 02/24/2021) 20 tablet 0   oxyCODONE (OXY IR/ROXICODONE) 5 MG immediate release tablet Take 1 tablet (5 mg total) by mouth every 6 (six) hours as needed for severe pain. 60 tablet 0   pravastatin (PRAVACHOL) 40 MG tablet Take 40 mg by mouth daily.  (  Patient not taking: Reported on 06/09/2021)     prochlorperazine (COMPAZINE) 10 MG tablet Take 1 tablet (10 mg total) by mouth every 6 (six) hours as needed for nausea or vomiting. (Patient not taking: Reported on 01/28/2021) 60 tablet 0   regorafenib (STIVARGA) 40 MG tablet Take 2 tablets ($RemoveBe'80mg'PgvnxTZDZ$ ) by mouth daily for 7 days, then 3 tablets ($RemoveBe'120mg'QHOqkAmQn$ )  daily for 7 days, then 4 tablets ($RemoveBe'160mg'ZxMUvOpiM$ ) daily for 7 days, then hold for 7 days.Take with low fat meal (breakfast). (Patient not taking: Reported on 11/18/2021) 63 tablet 0   sucralfate (CARAFATE) 1 g tablet Take 1 tablet (1 g total) by mouth 4 (four) times daily -  with meals and at bedtime. (Patient not taking: Reported on 11/18/2021) 90 tablet 3   tamsulosin (FLOMAX) 0.4 MG CAPS capsule Take 1 capsule (0.4 mg total) by mouth daily. 30 capsule 11   No current facility-administered medications for this visit.    VITAL SIGNS: There were no vitals taken for this visit. There were no vitals filed for this visit.  Estimated body mass index is 22.81 kg/m as calculated from the following:   Height as of 10/28/21: 6' (1.829 m).   Weight as of an earlier encounter on 11/18/21: 76.3 kg (168 lb 3.2 oz).  LABS: CBC:    Component Value Date/Time   WBC 5.5 11/18/2021 1420   HGB 11.4 (L) 11/18/2021 1420   HGB 13.3 09/18/2013 0424   HCT 34.6 (L) 11/18/2021 1420   HCT 38.1 (L) 09/18/2013 0424   PLT 197 11/18/2021 1420   PLT 307 09/18/2013 0424   MCV 110.2 (H) 11/18/2021 1420   MCV 94 09/18/2013 0424   NEUTROABS 3.6 11/18/2021 1420   NEUTROABS 17.0 (H) 09/18/2013 0424   LYMPHSABS 0.9 11/18/2021 1420   LYMPHSABS 0.9 (L) 09/18/2013 0424   MONOABS 0.6 11/18/2021 1420   MONOABS 0.4 09/18/2013 0424   EOSABS 0.3 11/18/2021 1420   EOSABS 0.0 09/18/2013 0424   BASOSABS 0.0 11/18/2021 1420   BASOSABS 0.0 09/18/2013 0424   Comprehensive Metabolic Panel:    Component Value Date/Time   NA 139 11/18/2021 1420   NA 137 09/18/2013 0424   K 3.5 11/18/2021 1420   K 4.5 09/18/2013 0424   CL 103 11/18/2021 1420   CL 107 09/18/2013 0424   CO2 28 11/18/2021 1420   CO2 28 09/18/2013 0424   BUN 9 11/18/2021 1420   BUN 19 (H) 09/18/2013 0424   CREATININE 0.87 11/18/2021 1420   CREATININE 1.15 09/18/2013 0424   GLUCOSE 117 (H) 11/18/2021 1420   GLUCOSE 196 (H) 09/18/2013 0424   CALCIUM 8.6 (L) 11/18/2021  1420   CALCIUM 9.1 09/18/2013 0424   AST 36 11/18/2021 1420   ALT 17 11/18/2021 1420   ALKPHOS 186 (H) 11/18/2021 1420   BILITOT 0.3 11/18/2021 1420   PROT 8.4 (H) 11/18/2021 1420   ALBUMIN 2.7 (L) 11/18/2021 1420     Present during today's visit: patient only  Assessment and Plan: Patient had nausea and vomiting after taking 3 days of regorafenib, then stopped the medication He will restart the regorafenib at 2 tablets ($RemoveBe'80mg'FxDWBPRNP$ ) daily for 21 days on/7days off He was instructed to take ondansetron 30-60 mins prior to his regorafenib as nausea prophylaxis. He also has prochlorperazine to use as needed.   Medication Access Issues: No issues, he has medication on hand to get restarted, he is still pending manufacturuer assistance  Patient expressed understanding and was in agreement with this plan. He also understands  that He can call clinic at any time with any questions, concerns, or complaints.   Follow-up plan: RTC in 3 weeks  Thank you for allowing me to participate in the care of this very pleasant patient.   Time Total: 10 mins  Visit consisted of counseling and education on dealing with issues of symptom management in the setting of serious and potentially life-threatening illness.Greater than 50%  of this time was spent counseling and coordinating care related to the above assessment and plan.  Signed by: Darl Pikes, PharmD, BCPS, Salley Slaughter, CPP Hematology/Oncology Clinical Pharmacist Practitioner [place]/DB/AP Oral [facility] [phone]  11/18/2021 4:36 PM

## 2021-11-18 NOTE — Progress Notes (Signed)
Patient still has right lower quadrant pain that is tolerable with pain medication regimen.  Has a 6 lb wt loss despite having a good appetite.  Not taken [facility] for about 2 weeks and the nausea vomiting has improved.  Was getting sick with the 3 days he did take [place].

## 2021-11-18 NOTE — Assessment & Plan Note (Addendum)
#  Right-sided colon adenocarcinoma-with synchronous metastasis to liver/unresectabls;  Currently on oral TAS-102 (*35 mg/m2 twice daily on days 1-5 and 8-12 every 28 days]+ BEV q 2 W. CT scan dec 2022-progression of disease noted with increasing lung nodules/left moderate pleural effusion; new liver lesion; soft tissue implant anterior to the right kidney ~ 5cm in size. MRI JAN 2023- Numerous (at least 8) new or enlarging small liver metastases; Recurrent viable malignancy within previously treated large segment 8 and anterior left liver metastases;  Progressive metastatic retroperitoneal lymphadenopathy;  Progressive multifocal right peritoneal metastases; Progressive right adrenal and upper right renal metastases. The upper right renal metastasis either increasingly encases or invades the right renal vein; Progressive pulmonary metastases at the lung bases;  Multifocal bulky left pleural metastases and moderate left pleural effusion, not substantially changed.  #I discussed option of discontinuation of Stivarga given patient's poor tolerance [see below] vs-restarting Stivarga at 2 pills a day without any dose escalation.  Patient interested in restarting Stivarga at this time.  Discussed that response rates are quite modest; I think is reasonable to discontinue if patient is having too many side effects.  Monitor for side effects closely.  Discussed with Ebony Hail; patient s/p evaluation with pharmacy.  # Right upper quadrant pain-secondary to malignancy-s/p evaluation with [name] palliative care; continue oxycodone 5 mg up to 3 pills a day as needed.  Patient can call for refills.  Discussed with [name].  # Mediport- functioning-STABLE>  #Prognosis: I again reviewed with the patient the prognosis unfortunately poor given the progressive disease s/p multiple lines of therapy.  Discussed that life expectancy is in the order of few-many months depending upon response to therapy or no reponse to therapy.   #  DISPOSITION:  # follow up in 3 weeks-  MD; labs- cbc/cmp CEA--Dr.B

## 2021-11-19 ENCOUNTER — Encounter: Payer: Self-pay | Admitting: *Deleted

## 2021-11-19 ENCOUNTER — Ambulatory Visit
Admission: RE | Admit: 2021-11-19 | Discharge: 2021-11-19 | Disposition: A | Discharge status: Home / Self Care | Payer: Medicare HMO | Source: Ambulatory Visit | Attending: Interventional Radiology | Admitting: Interventional Radiology

## 2021-11-19 DIAGNOSIS — C189 Malignant neoplasm of colon, unspecified: Secondary | ICD-10-CM | POA: Diagnosis not present

## 2021-11-19 DIAGNOSIS — C787 Secondary malignant neoplasm of liver and intrahepatic bile duct: Secondary | ICD-10-CM | POA: Diagnosis not present

## 2021-11-19 DIAGNOSIS — Z9889 Other specified postprocedural states: Secondary | ICD-10-CM | POA: Diagnosis not present

## 2021-11-19 HISTORY — PX: IR RADIOLOGIST EVAL & MGMT: IMG5224

## 2021-11-19 LAB — CEA: CEA: 177 ng/mL — ABNORMAL HIGH (ref 0.0–4.7)

## 2021-11-19 NOTE — Progress Notes (Signed)
Chief Complaint: Patient was consulted remotely today (TeleHealth) for right sided colon cancer metastatic to the liver at the request of [name]    Referring Physician(s): Dr. Charlaine Dalton  History of Present Illness: Oscar [name] is a 81 y.o. 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   Patient underwent neoadjuvant chemotherapy and right hemicolectomy (May 2020).  Hepatic metastatic disease also confirmed by biopsy in May 2020.  CT imaging in September 2020 showed progression of metastatic disease.  FOLFOX + Avastin begun Oct 2020.  In Sept 2021, CT imaging showed progression of disease and he was transitioned to FOLFIRI + Avastin.   MRI 11/27/20 shows stable hepatic disease with 2 discrete lesions (Seg 2/3 4.4 x 4.2 cm and Seg 6 1.2 cm).  However, [name] also developed a peptic ulcer and has been begun on carafate and PPI.  Further chemo held for now and patient is referred for evaluation for liver directed therapy.   Of note, CT CAP from 09/24/20 also shows pulmonary metastatic disease and a peritoneal implant just caudal to the prior hemicolectomy anastomosis.     [name] underwent bland embolization of the dominant lesion in the left hepatic lobe on 12/19/2020 followed by percutaneous thermal ablation on 01/15/2021.  The smaller lesion in hepatic segment 6 was not well visualized at that time and was not targeted.   MRI dated 02/27/21:  1. Ablation site of the anterior left lobe of the liver demonstrates no residual contrast enhancement. 2. Marked interval enlargement of a rim enhancing,metastatic lesion of the inferior right lobe of the liver, hepatic segment VI, measuring 3.3 x 2.8 cm, previously 1.1 cm. 3. New metastatic lesion just inferior, in the tip of the right lobe of the liver, hepatic segment VI, measuring 2.4 x 2.2 cm. 4. Redemonstrated lesion of the liver dome measuring approximately 7.0 x 6.8 cm. Although not  changed in size and previously described as a simple cyst, this lesion demonstrates new internal complexity and substantial heterogeneous peripheral contrast enhancement not seen on prior examination, concerning for metastatic lesion.  Note that this lesion was not present on examination dated 04/16/2018 and developed on examination dated 03/06/2019, not typical in behavior for a benign liver cyst.   04/04/21 - Right lobar TARE with Y90 resin microspheres.    MRI 07/20/21 1.) Treated lesions of the hepatic dome and segment VI demonstrate no residual contrast enhancement. 2.)  Evidence of progressive extrahepatic metastatic disease including multiple new large left pleural nodules, likely malignant left pleural effusion, and large periportal and retroperitoneal lymph nodes, right upper pole perinephric mass and currently has peritoneal implant in the right lower quadrant.  MRI 11/15/21 1. Significant interval progression of widespread metastatic disease since 07/18/2021 MRI.   [name] reports that he is feeling well.  His pain is relatively well controlled.  He understands the severity of these new imaging findings and has had conversations with Dr. Rogue Bussing about his overall prognosis.  His medication regimen was recently adjusted.  Past Medical History:  Diagnosis Date   Arthritis    Cancer ([place])    colon cancer 02/2019 per pt    Family history of adverse reaction to anesthesia    cousin took all night to wake up from anesthesia   H/O colon cancer, stage IV    Hyperlipemia    Neuromuscular disorder ([name])    Pre-diabetes     Past Surgical History:  Procedure Laterality Date  COLON SURGERY     ESOPHAGOGASTRODUODENOSCOPY (EGD) WITH PROPOFOL N/A 12/26/2020   Procedure: ESOPHAGOGASTRODUODENOSCOPY (EGD) WITH PROPOFOL;  Surgeon: [name], MD;  Location: [facility] ENDOSCOPY;  Service: Gastroenterology;  Laterality: N/A;   IR ANGIOGRAM SELECTIVE EACH ADDITIONAL VESSEL  12/19/2020   IR  ANGIOGRAM SELECTIVE EACH ADDITIONAL VESSEL  03/24/2021   IR ANGIOGRAM SELECTIVE EACH ADDITIONAL VESSEL  03/24/2021   IR ANGIOGRAM SELECTIVE EACH ADDITIONAL VESSEL  04/04/2021   IR ANGIOGRAM SELECTIVE EACH ADDITIONAL VESSEL  04/04/2021   IR ANGIOGRAM SELECTIVE EACH ADDITIONAL VESSEL  04/04/2021   IR ANGIOGRAM VISCERAL SELECTIVE  12/19/2020   IR ANGIOGRAM VISCERAL SELECTIVE  03/24/2021   IR ANGIOGRAM VISCERAL SELECTIVE  04/04/2021   IR ANGIOGRAM VISCERAL SELECTIVE  04/04/2021   IR EMBO ARTERIAL NOT HEMORR HEMANG INC GUIDE ROADMAPPING  03/24/2021   IR EMBO TUMOR ORGAN ISCHEMIA INFARCT INC GUIDE ROADMAPPING  12/19/2020   IR EMBO TUMOR ORGAN ISCHEMIA INFARCT INC GUIDE ROADMAPPING  04/04/2021   IR IMAGING GUIDED PORT INSERTION  07/20/2019   IR RADIOLOGIST EVAL & MGMT  12/05/2020   IR RADIOLOGIST EVAL & MGMT  02/11/2021   IR RADIOLOGIST EVAL & MGMT  03/04/2021   IR RADIOLOGIST EVAL & MGMT  04/29/2021   IR RADIOLOGIST EVAL & MGMT  07/22/2021   IR US GUIDE VASC ACCESS RIGHT  12/19/2020   IR US GUIDE VASC ACCESS RIGHT  03/24/2021   IR US GUIDE VASC ACCESS RIGHT  04/04/2021   JOINT REPLACEMENT Left 2010   Knee   RADIOLOGY WITH ANESTHESIA N/A 01/15/2021   Procedure: CT WITH ANESTHESIA MICROWAVE ABLATION OF LIVER;  Surgeon: Criselda Peaches, MD;  Location: [facility] ORS;  Service: Anesthesiology;  Laterality: N/A;    Allergies: Ace inhibitors  Medications: Prior to Admission medications   Medication Sig Start Date End Date Taking? Authorizing Provider  amLODipine (NORVASC) 5 MG tablet Take 5 mg by mouth daily. Patient not taking: Reported on 10/03/2021    [provider]  ondansetron (ZOFRAN) 4 MG tablet Take 1 tablet (4 mg total) by mouth every 8 (eight) hours as needed for nausea or vomiting. Patient not taking: Reported on 02/24/2021 01/15/21   Ardis Rowan, PA-C  ondansetron (ZOFRAN) 8 MG tablet Take 1 tablet (8 mg total) by mouth every 8 (eight) hours as needed for nausea or vomiting. Patient not taking:  Reported on 02/24/2021 12/19/20   Ronney Lion, PA-C  oxyCODONE (OXY IR/ROXICODONE) 5 MG immediate release tablet Take 1 tablet (5 mg total) by mouth every 6 (six) hours as needed for severe pain. 11/10/21   Borders, Kirt Boys, NP  pravastatin (PRAVACHOL) 40 MG tablet Take 40 mg by mouth daily.  Patient not taking: Reported on 06/09/2021    [provider]  prochlorperazine (COMPAZINE) 10 MG tablet Take 1 tablet (10 mg total) by mouth every 6 (six) hours as needed for nausea or vomiting. Patient not taking: Reported on 01/28/2021 11/21/20   Borders, Kirt Boys, NP  regorafenib (STIVARGA) 40 MG tablet Take 2 tablets (80mg ) by mouth daily for 7 days, then 3 tablets (120mg ) daily for 7 days, then 4 tablets (160mg ) daily for 7 days, then hold for 7 days.Take with low fat meal (breakfast). Patient not taking: Reported on 11/18/2021 10/28/21   Cammie Sickle, MD  sucralfate (CARAFATE) 1 g tablet Take 1 tablet (1 g total) by mouth 4 (four) times daily -  with meals and at bedtime. Patient not taking: Reported on 11/18/2021 06/09/21   Charlaine Dalton  R, MD  tamsulosin (FLOMAX) 0.4 MG CAPS capsule Take 1 capsule (0.4 mg total) by mouth daily. 01/27/21 01/27/22  Borders, Kirt Boys, NP     Family History  Problem Relation Age of Onset   Peptic Ulcer Disease Father     Social History   Socioeconomic History   Marital status: Married    Spouse name: Not on file   Number of children: Not on file   Years of education: Not on file   Highest education level: Not on file  Occupational History   Not on file  Tobacco Use   Smoking status: Every Day    Packs/day: 0.25    Years: 15.00    Pack years: 3.75    Types: Cigarettes   Smokeless tobacco: Never   Tobacco comments:    1 to 2 cigarettes a day occasionally  Vaping Use   Vaping Use: Never used  Substance and Sexual Activity   Alcohol use: Yes    Comment: rarely    Drug use: No   Sexual activity: Not on file  Other Topics Concern   Not  on file  Social History Narrative   Recruitment consultant retd; lives in [place]; smoking 3cig/day; [3/4 ppd x started at 9 years]; no alcohol. Son & daughter; wife dementia [waiting for placement].    Social Determinants of Health   Financial Resource Strain: Not on file  Food Insecurity: Not on file  Transportation Needs: Not on file  Physical Activity: Not on file  Stress: Not on file  Social Connections: Not on file    ECOG Status: 1 - Symptomatic but completely ambulatory  Review of Systems  Review of Systems: A 12 point ROS discussed and pertinent positives are indicated in the HPI above.  All other systems are negative.  Physical Exam No direct physical exam was performed (except for noted visual exam findings with Video Visits).    Vital Signs: There were no vitals taken for this visit.  Imaging: MR ABDOMEN WWO CONTRAST  Result Date: 11/15/2021 CLINICAL DATA:  Metastatic colon cancer. History of right hemicolectomy. History of bland embolization of liver metastases 12/19/2020, microwave ablation of left liver metastasis 01/15/2021 and Y-90 radioembolization of segments 6 and 8 right liver lesions 04/04/2021. Restaging. EXAM: MRI ABDOMEN WITHOUT AND WITH CONTRAST TECHNIQUE: Multiplanar multisequence MR imaging of the abdomen was performed both before and after the administration of intravenous contrast. CONTRAST:  7.79mL GADAVIST GADOBUTROL 1 MMOL/ML IV SOLN COMPARISON:  07/18/2021 MRI abdomen. 10/06/2021 CT chest, abdomen and pelvis. FINDINGS: Lower chest: Moderate left pleural effusion is similar to prior MRI. Multifocal bulky left pleural masses are not substantially changed. Representative 6.2 x 3.1 cm basilar left pleural mass (series 4/image 17), previously 6.0 x 3.0 cm on 07/18/2021 MRI. Representative 3.7 x 2.5 cm basilar left pleural mass (series 4/image 19), previously 3.6 x 2.5 cm. Representative 3.1 x 2.3 cm posterior left pleural mass (series 4/image 7),  previously 3.0 x 2.5 cm. Multiple scattered pulmonary nodules at both lung bases, new and increased since 07/18/2021 MRI and probably increased since 10/06/2021 chest CT, for example a 1.3 cm basilar right lower lobe pulmonary nodule (series 4/image 6), previously 0.8 cm on 10/06/2021 chest CT and new since 07/18/2021 MRI. Hepatobiliary: Numerous (at least 8) small hypoenhancing liver masses scattered throughout the liver are new or increased since 07/18/2021 MRI, the largest measuring 1.0 x 1.0 cm in the segment 7 right liver (series 15/image 21), increased from 0.5 x 0.4 cm on 07/18/2021  MRI. Representative 0.7 x 0.5 cm lesion in the segment 6 right liver (series 15/image 44), increased from 0.3 x 0.3 cm on 07/18/2021 MRI. Representative new 0.7 x 0.6 cm in the peripheral segment 8 right liver (series 15/image 19). Irregular 8.3 x 7.4 cm segment 8 right liver mass (series 20/image 15), previously 8.6 x 7.4 cm using similar measurement technique, not substantially changed in size, however with increased heterogeneous peripheral enhancement most prominent anteriorly (series 21/image 14). Irregular 8.3 x 6.0 cm anterior left liver mass (series 20/image 27), increased from 6.9 x 4.3 cm, with increased heterogeneous peripheral enhancement on the subtraction sequences. Ablated 2.3 x 2.0 cm segment 6 right liver mass (series 20/image 44), previously 2.3 x 2.2 cm, not appreciably changed, with similar thin relatively uniform mural enhancement. Far inferior right liver 2.3 x 1.9 cm capsular mass with thick irregular peripheral enhancement (series 20/image 55), previously 1.9 x 1.6 cm, mildly increased. No hepatic steatosis. Normal gallbladder with no cholelithiasis. Mild central intrahepatic biliary ductal dilatation is mildly increased from 07/18/2021 MRI. Dilated common bile duct with diameter 14 mm, increased from 9 mm on 07/18/2021 MRI. No evidence of choledocholithiasis. No discrete obstructing biliary mass. Pancreas:  No pancreatic mass. Generalized pancreatic duct dilation up to 5 mm diameter, increased from 3 mm on 07/18/2021 MRI. No pancreas divisum. Spleen: Normal size. No mass. Adrenals/Urinary Tract: Heterogeneous enhancing 4.6 x 2.5 cm right adrenal mass (series 13/image 44), increased from 3.0 x 1.6 cm on 07/18/2021 MRI. No left adrenal mass. No hydronephrosis. Infiltrative anterior upper right renal cortical 4.5 x 4.4 cm heterogeneous enhancing mass (series 13/image 53), increased from 3.6 x 3.1 cm on 07/18/2021 MRI. Exophytic simple 4.7 cm lateral lower left renal cyst. Stomach/Bowel: Normal non-distended stomach. Partially visualized postsurgical changes from subtotal right hemicolectomy with ileocolic anastomosis in the right abdomen. No dilated small or large bowel loops. Vascular/Lymphatic: Atherosclerotic abdominal aorta with stable 3.3 cm infrarenal abdominal aortic aneurysm. The anterior upper right renal cortical mass either increasingly encases or invades the right renal vein. Enlarged necrotic pathologic retroperitoneal lymph nodes have increased since 07/18/2021 MRI. Representative 2.7 cm portacaval node (series 20/image 39), increased from 1.5 cm. Representative 2.3 cm retrocaval node (series 20/image 46), increased from 1.7 cm. Representative 1.6 cm aortocaval node (series 20/image 63), increased from 1.4 cm. Other: Trace perihepatic ascites is similar. No focal fluid collections. Enhancing 1.0 cm right peritoneal soft tissue mass near the inferior right liver (series 20/image 59), increased from 0.6 cm on 07/18/2021 MRI. Enhancing 2.1 cm anterior right peritoneal soft tissue mass (series 20/image 67), increased from 1.4 cm on 07/18/2021 MRI. Heterogeneous enhancing 3.8 x 3.2 cm posterior right peritoneal mass (series 22/image 70), mildly increased from 3.4 x 2.8 cm on 07/18/2021 MRI. Musculoskeletal: No aggressive appearing focal osseous lesions. IMPRESSION: 1. Significant interval progression of  widespread metastatic disease since 07/18/2021 MRI. 2. Numerous (at least 8) new or enlarging small liver metastases. Recurrent viable malignancy within previously treated large segment 8 and anterior left liver metastases. 3. Progressive metastatic retroperitoneal lymphadenopathy. 4. Progressive multifocal right peritoneal metastases. 5. Progressive right adrenal and upper right renal metastases. The upper right renal metastasis either increasingly encases or invades the right renal vein. 6. Progressive pulmonary metastases at the lung bases. 7. Multifocal bulky left pleural metastases and moderate left pleural effusion, not substantially changed. 8. Mild central intrahepatic and more prominent extrahepatic biliary ductal dilatation and mild pancreatic duct dilation, increased from 07/18/2021 MRI. No discrete obstructing biliary mass. No choledocholithiasis.  Correlate with serum bilirubin levels and with any opioid analgesic use. 9. Stable trace perihepatic ascites. Electronically Signed   By: Ilona Sorrel M.D.   On: 11/15/2021 16:24    Labs:  CBC: Recent Labs    09/25/21 0829 10/28/21 1014 11/10/21 1053 11/18/21 1420  WBC 3.8* 1.6* 6.2 5.5  HGB 10.6* 10.0* 10.8* 11.4*  HCT 31.5* 29.4* 31.8* 34.6*  PLT 123* 163 161 197    COAGS: Recent Labs    12/19/20 0954 01/15/21 0739 03/24/21 0825 04/04/21 0811  INR 1.0 0.9 1.1 1.2  APTT  --   --   --  40*    BMP: Recent Labs    09/25/21 0829 10/28/21 1014 11/10/21 1053 11/18/21 1420  NA 136 133* 133* 139  K 3.5 3.8 3.4* 3.5  CL 102 104 103 103  CO2 24 24 22 28   GLUCOSE 188* 225* 288* 117*  BUN 10 11 15 9   CALCIUM 8.2* 7.9* 8.0* 8.6*  CREATININE 0.86 0.84 0.94 0.87  GFRNONAA >60 >60 >60 >60    LIVER FUNCTION TESTS: Recent Labs    09/25/21 0829 10/28/21 1014 11/10/21 1053 11/18/21 1420  BILITOT 0.8 0.9 1.2 0.3  AST 34 35 40 36  ALT 16 17 18 17   ALKPHOS 165* 155* 170* 186*  PROT 7.5 7.5 8.0 8.4*  ALBUMIN 2.7* 2.9* 2.6*  2.7*    TUMOR MARKERS: No results for input(s): AFPTM, CEA, CA199, CHROMGRNA in the last 8760 hours.  Assessment and Plan:  Unfortunately, Mr. Honeyman most recent surveillance imaging demonstrates significant interval progression of widespread metastatic disease both within and external to the liver.  This represents a relatively rapid change compared to his prior MRI from 07/18/2021.  Due to the extent of extrahepatic disease, further liver directed therapy is unlikely to yield any significant clinical benefit.  Mr. Latin will continue to follow-up with Dr. Rogue Bussing for systemic therapy.  At this time, we will not schedule any further follow-up for Mr. Rajan in order to minimize the number of appointments and physicians he needs to keep track of.  He is understanding and appreciative of this.  He knows that we are always available if his circumstances should change and additional liver directed therapy could be of some benefit in the future.    Electronically Signed: Criselda Peaches 11/19/2021, 10:56 AM   I spent a total of  15 Minutes in remote  clinical consultation, greater than 50% of which was counseling/coordinating care for metastatic colon cancer.    Visit type: Audio only (telephone). Audio (no video) only due to patient preference. Alternative for in-person consultation at [facility], [address] [place], [place], [place]. This visit type was conducted due to national recommendations for restrictions regarding the COVID-19 Pandemic (e.g. social distancing).  This format is felt to be most appropriate for this patient at this time.  All issues noted in this document were discussed and addressed.

## 2021-11-25 ENCOUNTER — Encounter: Payer: Self-pay | Admitting: Internal Medicine

## 2021-11-25 ENCOUNTER — Other Ambulatory Visit: Payer: Self-pay | Admitting: *Deleted

## 2021-11-25 ENCOUNTER — Other Ambulatory Visit: Payer: Self-pay | Admitting: Hospice and Palliative Medicine

## 2021-11-25 MED ORDER — OXYCODONE HCL 5 MG PO TABS: 5.0000 mg | ORAL_TABLET | Freq: Four times a day (QID) | ORAL | 0 refills | Status: DC | PRN

## 2021-11-25 MED ORDER — OXYCODONE HCL 5 MG PO TABS
5.0000 mg | ORAL_TABLET | Freq: Four times a day (QID) | ORAL | 0 refills | Status: DC | PRN
Start: 2021-11-25 — End: 2021-11-25

## 2021-11-25 MED ORDER — SENNA 8.6 MG PO TABS: 1.0000 | ORAL_TABLET | Freq: Every day | ORAL | 0 refills | Status: DC

## 2021-11-25 MED ORDER — POLYETHYLENE GLYCOL 3350 17 GM/SCOOP PO POWD: 1.0000 | Freq: Once | ORAL | 0 refills | Status: AC

## 2021-11-25 NOTE — Telephone Encounter (Signed)
Patient also requesting medicine for constipation stating he has not had BM in 4 days and he has tried Dulcolax with no results

## 2021-12-03 NOTE — Telephone Encounter (Signed)
Oral Oncology Patient Advocate Encounter  Received notification on 11/25/21 from [facility] that patient has been successfully enrolled into their program to receive Stivarga from the manufacturer at $0 out of pocket until 10/18/22.    I called and spoke with patient.  He knows we will have to re-apply.   Specialty Pharmacy that will dispense medication is [facility].  Patient knows to call the office with questions or concerns.   Oral Oncology Clinic will continue to follow.  [place] [facility] [place] Phone [phone] Fax [phone] 12/03/2021 8:52 AM

## 2021-12-04 ENCOUNTER — Telehealth: Payer: Medicare HMO | Admitting: Hospice and Palliative Medicine

## 2021-12-08 ENCOUNTER — Inpatient Hospital Stay: Payer: Medicare HMO | Attending: Internal Medicine

## 2021-12-08 ENCOUNTER — Inpatient Hospital Stay: Payer: Medicare HMO | Admitting: Internal Medicine

## 2021-12-08 ENCOUNTER — Inpatient Hospital Stay: Payer: Medicare HMO | Admitting: Pharmacist

## 2021-12-08 ENCOUNTER — Other Ambulatory Visit: Payer: Self-pay

## 2021-12-08 ENCOUNTER — Inpatient Hospital Stay (HOSPITAL_BASED_OUTPATIENT_CLINIC_OR_DEPARTMENT_OTHER): Payer: Medicare HMO | Admitting: Hospice and Palliative Medicine

## 2021-12-08 ENCOUNTER — Encounter: Payer: Self-pay | Admitting: Internal Medicine

## 2021-12-08 DIAGNOSIS — C7901 Secondary malignant neoplasm of right kidney and renal pelvis: Secondary | ICD-10-CM | POA: Insufficient documentation

## 2021-12-08 DIAGNOSIS — C182 Malignant neoplasm of ascending colon: Secondary | ICD-10-CM | POA: Insufficient documentation

## 2021-12-08 DIAGNOSIS — Z515 Encounter for palliative care: Secondary | ICD-10-CM

## 2021-12-08 DIAGNOSIS — C786 Secondary malignant neoplasm of retroperitoneum and peritoneum: Secondary | ICD-10-CM | POA: Diagnosis not present

## 2021-12-08 DIAGNOSIS — J90 Pleural effusion, not elsewhere classified: Secondary | ICD-10-CM | POA: Insufficient documentation

## 2021-12-08 DIAGNOSIS — C772 Secondary and unspecified malignant neoplasm of intra-abdominal lymph nodes: Secondary | ICD-10-CM | POA: Diagnosis not present

## 2021-12-08 DIAGNOSIS — C7800 Secondary malignant neoplasm of unspecified lung: Secondary | ICD-10-CM | POA: Insufficient documentation

## 2021-12-08 DIAGNOSIS — F17210 Nicotine dependence, cigarettes, uncomplicated: Secondary | ICD-10-CM | POA: Diagnosis not present

## 2021-12-08 DIAGNOSIS — Z79899 Other long term (current) drug therapy: Secondary | ICD-10-CM | POA: Diagnosis not present

## 2021-12-08 DIAGNOSIS — C782 Secondary malignant neoplasm of pleura: Secondary | ICD-10-CM | POA: Insufficient documentation

## 2021-12-08 DIAGNOSIS — G893 Neoplasm related pain (acute) (chronic): Secondary | ICD-10-CM | POA: Diagnosis not present

## 2021-12-08 DIAGNOSIS — C787 Secondary malignant neoplasm of liver and intrahepatic bile duct: Secondary | ICD-10-CM | POA: Insufficient documentation

## 2021-12-08 LAB — CBC WITH DIFFERENTIAL/PLATELET
Abs Immature Granulocytes: 0.03 10*3/uL (ref 0.00–0.07)
Basophils Absolute: 0.1 10*3/uL (ref 0.0–0.1)
Basophils Relative: 1 %
Eosinophils Absolute: 0.5 10*3/uL (ref 0.0–0.5)
Eosinophils Relative: 7 %
HCT: 38.5 % — ABNORMAL LOW (ref 39.0–52.0)
Hemoglobin: 12.6 g/dL — ABNORMAL LOW (ref 13.0–17.0)
Immature Granulocytes: 0 %
Lymphocytes Relative: 14 %
Lymphs Abs: 1 10*3/uL (ref 0.7–4.0)
MCH: 34.5 pg — ABNORMAL HIGH (ref 26.0–34.0)
MCHC: 32.7 g/dL (ref 30.0–36.0)
MCV: 105.5 fL — ABNORMAL HIGH (ref 80.0–100.0)
Monocytes Absolute: 0.6 10*3/uL (ref 0.1–1.0)
Monocytes Relative: 8 %
Neutro Abs: 5 10*3/uL (ref 1.7–7.7)
Neutrophils Relative %: 70 %
Platelets: 186 10*3/uL (ref 150–400)
RBC: 3.65 MIL/uL — ABNORMAL LOW (ref 4.22–5.81)
RDW: 13.4 % (ref 11.5–15.5)
WBC: 7.2 10*3/uL (ref 4.0–10.5)
nRBC: 0 % (ref 0.0–0.2)

## 2021-12-08 LAB — COMPREHENSIVE METABOLIC PANEL
ALT: 19 U/L (ref 0–44)
AST: 41 U/L (ref 15–41)
Albumin: 2.6 g/dL — ABNORMAL LOW (ref 3.5–5.0)
Alkaline Phosphatase: 191 U/L — ABNORMAL HIGH (ref 38–126)
Anion gap: 5 (ref 5–15)
BUN: 12 mg/dL (ref 8–23)
CO2: 26 mmol/L (ref 22–32)
Calcium: 8.3 mg/dL — ABNORMAL LOW (ref 8.9–10.3)
Chloride: 106 mmol/L (ref 98–111)
Creatinine, Ser: 0.86 mg/dL (ref 0.61–1.24)
GFR, Estimated: >60 mL/min (ref >60)
Glucose, Bld: 127 mg/dL — ABNORMAL HIGH (ref 70–99)
Potassium: 4.1 mmol/L (ref 3.5–5.1)
Sodium: 137 mmol/L (ref 135–145)
Total Bilirubin: 0.6 mg/dL (ref 0.3–1.2)
Total Protein: 8.9 g/dL — ABNORMAL HIGH (ref 6.5–8.1)

## 2021-12-08 MED ORDER — REGORAFENIB 40 MG PO TABS: 80.0000 mg | ORAL_TABLET | Freq: Every day | ORAL | 1 refills | Status: DC

## 2021-12-08 MED ORDER — OXYCODONE HCL 5 MG PO TABS: 5.0000 mg | ORAL_TABLET | Freq: Four times a day (QID) | ORAL | 0 refills | Status: DC | PRN

## 2021-12-08 MED ORDER — PREDNISONE 20 MG PO TABS: 20.0000 mg | ORAL_TABLET | Freq: Every day | ORAL | 1 refills | Status: DC

## 2021-12-08 NOTE — Progress Notes (Signed)
Patient reports constipation has improved with taking Miralax and Sennakot.  Is down 8 lbs in the last 3 weeks that he relates to the relief in constipation.    Is taking Stivarga 2 tabs QD since last MD visit.

## 2021-12-08 NOTE — Progress Notes (Signed)
[facility] NOTE  Patient Care Team: Sofie Hartigan, MD as PCP - General (Family Medicine) Cammie Sickle, MD as Consulting Physician (Hematology and Oncology)  CHIEF COMPLAINTS/PURPOSE OF CONSULTATION: Colon cancer  #  Oncology History Overview Note  # MAY 2020- 3. 03/09/19 Liver biopsy. Microscopic examination shows malignant cells with glandular architecture consistent with adenocarcinoma. The malignant cells are positive for CK20 and CDX-2. These findings support the clinical impression of metastatic colon adenocarcinoma. 4. 03/10/19 R hemicolectomy. Tumor site cecum. Adenocarcinoma. Mucinous features present. G2. No tumor deposits. Invades visceral peritoneum. No tumor perforation. LVI present. PNI not identified. All margins uninvolved. 1/12 LNs. PT4apN1. Periappendiceal inflammation c/w resolving abscess. Microsatellite stable (MSS). [Dr.Mettu; [facility]]  # SEP 4th 2020 [compared to May 2020]  Interval increase in size of the metastases to the hepatic dome, The metastasis to the left hepatic lobe is unchanged; 2.  New subcentimeter hypoattenuating lesion in the inferior right hepatic lobe, incompletely characterized on CT. 3.  Postsurgical changes following right hemicolectomy.  # OCT 2020- FOLFOX +avastin; CT dec 22nd 2020- [compared to [facility] sep 9th 2020]-Liver- slight progression versus stable disease; CT scan SEP 4th 2021- Progressive disease-left lower lobe lung nodule 8 mm [previously 4 mm]; increase in size of the hepatic metastases by few millimeters.;  Soft tissue nodule adjacent to anastomotic site again increased by few millimeters.STOP FOLFOX; cont avastin  #  SEP 20th, 2021- FOLFIRI+ AVASTIN; HOLD FEB 2022- sec to poor tolerance [peptic ulcer]; 3/30-liver ablation-   [bland embolization of segment 2/3 liver lesion on 12/19/2020;  microwave ablation of both liver lesions.  # ?July 25th, 2022- BEV _ TAS 102.MAY 2022- s/p Liver ablation  # DEC  2022-progressive disease-lung and liver/abdomen; JAN 31st 2023-START Regorefenib.   # FEB /16-EGD [Dr.Anna-duodenal ulcer-? meloxicam]  # JAN 2022- COVID [s/p Mab infusion; pills; skin rash resolved.]  # NGS/F-ONE-MUTATED K-RAS [G]  # PALLIATIVE CARE EVALUATION: 09/20/2019-[name]  # PAIN MANAGEMENT: NA   DIAGNOSIS: COLON CANCER  STAGE:  IV     ;  GOALS:Palliative      Cancer of right colon ([place])  07/05/2019 Initial Diagnosis   Cancer of right colon ([place])   07/24/2019 - 06/25/2020 Chemotherapy   The patient had dexamethasone (DECADRON) 4 MG tablet, 8 mg, Oral, Daily, 1 of 1 cycle, Start date: --, End date: -- palonosetron (ALOXI) injection 0.25 mg, 0.25 mg, Intravenous,  Once, 23 of 25 cycles Administration: 0.25 mg (07/24/2019), 0.25 mg (08/07/2019), 0.25 mg (08/21/2019), 0.25 mg (09/04/2019), 0.25 mg (09/20/2019), 0.25 mg (10/04/2019), 0.25 mg (10/16/2019), 0.25 mg (10/30/2019), 0.25 mg (11/22/2019), 0.25 mg (12/06/2019), 0.25 mg (12/20/2019), 0.25 mg (01/03/2020), 0.25 mg (01/29/2020), 0.25 mg (02/12/2020), 0.25 mg (02/26/2020), 0.25 mg (03/11/2020), 0.25 mg (03/25/2020), 0.25 mg (04/08/2020), 0.25 mg (04/24/2020), 0.25 mg (05/08/2020), 0.25 mg (05/22/2020), 0.25 mg (06/10/2020), 0.25 mg (06/25/2020) leucovorin 800 mg in dextrose 5 % 250 mL infusion, 844 mg, Intravenous,  Once, 23 of 25 cycles Administration: 800 mg (07/24/2019), 800 mg (08/07/2019), 800 mg (08/21/2019), 800 mg (09/04/2019), 800 mg (09/20/2019), 800 mg (10/04/2019), 800 mg (10/16/2019), 800 mg (10/30/2019), 800 mg (11/22/2019), 800 mg (12/06/2019), 800 mg (12/20/2019), 800 mg (01/03/2020), 800 mg (01/29/2020), 800 mg (02/12/2020), 800 mg (02/26/2020), 800 mg (03/11/2020), 800 mg (03/25/2020), 800 mg (04/08/2020), 800 mg (04/24/2020), 800 mg (05/08/2020), 800 mg (05/22/2020), 800 mg (06/10/2020), 800 mg (06/25/2020) oxaliplatin (ELOXATIN) 180 mg in dextrose 5 % 500 mL chemo infusion, 85 mg/m2 = 180 mg, Intravenous,  Once, 23 of  25 cycles Dose modification: 178 mg (original  dose 85 mg/m2, Cycle 17, Reason: Other (see comments), Comment: insurance adjusted dose ) Administration: 180 mg (07/24/2019), 180 mg (08/07/2019), 180 mg (08/21/2019), 180 mg (09/04/2019), 180 mg (09/20/2019), 180 mg (10/04/2019), 180 mg (10/16/2019), 180 mg (10/30/2019), 180 mg (11/22/2019), 180 mg (12/06/2019), 180 mg (12/20/2019), 180 mg (01/03/2020), 180 mg (01/29/2020), 180 mg (02/12/2020), 180 mg (02/26/2020), 180 mg (03/11/2020), 180 mg (04/08/2020), 180 mg (04/24/2020), 180 mg (05/08/2020), 180 mg (05/22/2020), 180 mg (06/10/2020), 180 mg (06/25/2020) fluorouracil (ADRUCIL) 5,000 mg in sodium chloride 0.9 % 150 mL chemo infusion, 5,050 mg, Intravenous, 1 Day/Dose, 23 of 25 cycles Administration: 5,000 mg (07/24/2019), 5,000 mg (08/07/2019), 5,000 mg (08/21/2019), 5,050 mg (09/04/2019), 5,000 mg (10/04/2019), 5,000 mg (10/16/2019), 5,000 mg (11/22/2019), 5,000 mg (12/06/2019), 5,000 mg (12/20/2019), 5,000 mg (01/03/2020), 5,000 mg (01/29/2020), 5,000 mg (02/12/2020), 5,000 mg (02/26/2020), 5,000 mg (03/11/2020), 5,000 mg (03/25/2020), 5,000 mg (04/08/2020), 5,000 mg (04/24/2020), 5,000 mg (05/08/2020), 5,000 mg (05/22/2020), 5,000 mg (06/10/2020), 5,000 mg (06/25/2020) bevacizumab-bvzr (ZIRABEV) 400 mg in sodium chloride 0.9 % 100 mL chemo infusion, 5 mg/kg = 400 mg, Intravenous,  Once, 23 of 25 cycles Administration: 400 mg (08/07/2019), 400 mg (08/21/2019), 400 mg (09/04/2019), 400 mg (09/20/2019), 400 mg (10/04/2019), 400 mg (10/16/2019), 400 mg (10/30/2019), 400 mg (11/22/2019), 400 mg (12/06/2019), 400 mg (12/20/2019), 400 mg (01/03/2020), 400 mg (01/29/2020), 400 mg (02/12/2020), 400 mg (02/26/2020), 400 mg (03/11/2020), 400 mg (03/25/2020), 400 mg (04/08/2020), 400 mg (04/24/2020), 400 mg (05/08/2020), 400 mg (05/22/2020), 400 mg (06/10/2020), 400 mg (06/25/2020)   for chemotherapy treatment.     07/08/2020 -  Chemotherapy   Patient is on Treatment Plan : COLORECTAL FOLFIRI / BEVACIZUMAB Q14D     11/05/2020 Cancer Staging   Staging form: Colon and Rectum,  AJCC 8th Edition - Clinical: Stage IVC (pM1c) - Signed by Earna Coder, MD on 11/05/2020      HISTORY OF PRESENTING ILLNESS: with son.  Ambulating independently.  Pricilla Handler 81 y.o.  male with metastatic colon cancer to the liver-s/p multiple lines of therapy on stivarga [2 pills a day ]x 3 weeks [3 week on and 1 week off]  Constipation improved on laxatives.  Abdominal pain-currently controlled on pain medication. Patient is taking 5 mg oxycodone up to 3 a day.    Nausea vomiting is resolved. No fever no chills.  No sores in the mouth.  No rash on palms and soles.  Review of Systems  Constitutional:  Positive for malaise/fatigue. Negative for chills, diaphoresis and fever.  HENT:  Negative for nosebleeds and sore throat.   Eyes:  Negative for double vision.  Respiratory:  Negative for cough, hemoptysis, sputum production, shortness of breath and wheezing.   Cardiovascular:  Negative for chest pain, palpitations, orthopnea and leg swelling.  Gastrointestinal:  Negative for blood in stool, constipation, heartburn, melena, nausea and vomiting.  Genitourinary:  Negative for dysuria, frequency and urgency.  Musculoskeletal:  Positive for back pain and joint pain.  Skin: Negative.  Negative for itching and rash.  Neurological:  Positive for tingling. Negative for focal weakness, weakness and headaches.  Endo/Heme/Allergies:  Does not bruise/bleed easily.  Psychiatric/Behavioral:  Negative for depression. The patient is not nervous/anxious and does not have insomnia.     MEDICAL HISTORY:  Past Medical History:  Diagnosis Date   Arthritis    Cancer (HCC)    colon cancer 02/2019 per pt    Family history of adverse reaction to anesthesia  cousin took all night to wake up from anesthesia   H/O colon cancer, stage IV    Hyperlipemia    Neuromuscular disorder ([place])    Pre-diabetes     SURGICAL HISTORY: Past Surgical History:  Procedure Laterality Date   COLON SURGERY      ESOPHAGOGASTRODUODENOSCOPY (EGD) WITH PROPOFOL N/A 12/26/2020   Procedure: ESOPHAGOGASTRODUODENOSCOPY (EGD) WITH PROPOFOL;  Surgeon: [name], MD;  Location: [facility] ENDOSCOPY;  Service: Gastroenterology;  Laterality: N/A;   IR ANGIOGRAM SELECTIVE EACH ADDITIONAL VESSEL  12/19/2020   IR ANGIOGRAM SELECTIVE EACH ADDITIONAL VESSEL  03/24/2021   IR ANGIOGRAM SELECTIVE EACH ADDITIONAL VESSEL  03/24/2021   IR ANGIOGRAM SELECTIVE EACH ADDITIONAL VESSEL  04/04/2021   IR ANGIOGRAM SELECTIVE EACH ADDITIONAL VESSEL  04/04/2021   IR ANGIOGRAM SELECTIVE EACH ADDITIONAL VESSEL  04/04/2021   IR ANGIOGRAM VISCERAL SELECTIVE  12/19/2020   IR ANGIOGRAM VISCERAL SELECTIVE  03/24/2021   IR ANGIOGRAM VISCERAL SELECTIVE  04/04/2021   IR ANGIOGRAM VISCERAL SELECTIVE  04/04/2021   IR EMBO ARTERIAL NOT HEMORR HEMANG INC GUIDE ROADMAPPING  03/24/2021   IR EMBO TUMOR ORGAN ISCHEMIA INFARCT INC GUIDE ROADMAPPING  12/19/2020   IR EMBO TUMOR ORGAN ISCHEMIA INFARCT INC GUIDE ROADMAPPING  04/04/2021   IR IMAGING GUIDED PORT INSERTION  07/20/2019   IR RADIOLOGIST EVAL & MGMT  12/05/2020   IR RADIOLOGIST EVAL & MGMT  02/11/2021   IR RADIOLOGIST EVAL & MGMT  03/04/2021   IR RADIOLOGIST EVAL & MGMT  04/29/2021   IR RADIOLOGIST EVAL & MGMT  07/22/2021   IR RADIOLOGIST EVAL & MGMT  11/19/2021   IR US GUIDE VASC ACCESS RIGHT  12/19/2020   IR US GUIDE VASC ACCESS RIGHT  03/24/2021   IR US GUIDE VASC ACCESS RIGHT  04/04/2021   JOINT REPLACEMENT Left 2010   Knee   RADIOLOGY WITH ANESTHESIA N/A 01/15/2021   Procedure: CT WITH ANESTHESIA MICROWAVE ABLATION OF LIVER;  Surgeon: Criselda Peaches, MD;  Location: [facility] ORS;  Service: Anesthesiology;  Laterality: N/A;    SOCIAL HISTORY: Social History   Socioeconomic History   Marital status: Married    Spouse name: Not on file   Number of children: Not on file   Years of education: Not on file   Highest education level: Not on file  Occupational History   Not on file  Tobacco Use   Smoking status:  Every Day    Packs/day: 0.25    Years: 15.00    Pack years: 3.75    Types: Cigarettes   Smokeless tobacco: Never   Tobacco comments:    1 to 2 cigarettes a day occasionally  Vaping Use   Vaping Use: Never used  Substance and Sexual Activity   Alcohol use: Yes    Comment: rarely    Drug use: No   Sexual activity: Not on file  Other Topics Concern   Not on file  Social History Narrative   Recruitment consultant retd; lives in [place]; smoking 3cig/day; [3/4 ppd x started at 9 years]; no alcohol. Son & daughter; wife dementia [waiting for placement].    Social Determinants of Health   Financial Resource Strain: Not on file  Food Insecurity: Not on file  Transportation Needs: Not on file  Physical Activity: Not on file  Stress: Not on file  Social Connections: Not on file  Intimate Partner Violence: Not on file    FAMILY HISTORY: Family History  Problem Relation Age of Onset   Peptic Ulcer Disease Father  ALLERGIES:  is allergic to ace inhibitors.  MEDICATIONS:  Current Outpatient Medications  Medication Sig Dispense Refill   polyethylene glycol powder (MIRALAX) 17 GM/SCOOP powder Take by mouth as needed.     predniSONE (DELTASONE) 20 MG tablet Take 1 tablet (20 mg total) by mouth daily with breakfast. 30 tablet 1   regorafenib (STIVARGA) 40 MG tablet Take 2 tablets ($RemoveBe'80mg'EmgkYDHCI$ ) by mouth daily for 7 days, then 3 tablets ($RemoveBe'120mg'yUkMbWAMJ$ ) daily for 7 days, then 4 tablets ($RemoveBe'160mg'MqvHRMgHU$ ) daily for 7 days, then hold for 7 days.Take with low fat meal (breakfast). 63 tablet 0   senna (SENOKOT) 8.6 MG TABS tablet Take 1 tablet (8.6 mg total) by mouth daily. 120 tablet 0   tamsulosin (FLOMAX) 0.4 MG CAPS capsule Take 1 capsule (0.4 mg total) by mouth daily. 30 capsule 11   amLODipine (NORVASC) 5 MG tablet Take 5 mg by mouth daily. (Patient not taking: Reported on 10/03/2021)     ondansetron (ZOFRAN) 4 MG tablet Take 1 tablet (4 mg total) by mouth every 8 (eight) hours as needed for nausea or  vomiting. (Patient not taking: Reported on 02/24/2021) 20 tablet 0   ondansetron (ZOFRAN) 8 MG tablet Take 1 tablet (8 mg total) by mouth every 8 (eight) hours as needed for nausea or vomiting. (Patient not taking: Reported on 02/24/2021) 20 tablet 0   oxyCODONE (OXY IR/ROXICODONE) 5 MG immediate release tablet Take 1 tablet (5 mg total) by mouth every 6 (six) hours as needed for severe pain. 60 tablet 0   pravastatin (PRAVACHOL) 40 MG tablet Take 40 mg by mouth daily.  (Patient not taking: Reported on 06/09/2021)     prochlorperazine (COMPAZINE) 10 MG tablet Take 1 tablet (10 mg total) by mouth every 6 (six) hours as needed for nausea or vomiting. (Patient not taking: Reported on 01/28/2021) 60 tablet 0   sucralfate (CARAFATE) 1 g tablet Take 1 tablet (1 g total) by mouth 4 (four) times daily -  with meals and at bedtime. (Patient not taking: Reported on 11/18/2021) 90 tablet 3   No current facility-administered medications for this visit.      Marland Kitchen  PHYSICAL EXAMINATION: ECOG PERFORMANCE STATUS: 0 - Asymptomatic  Vitals:   12/08/21 0900  BP: (!) 149/81  Pulse: 69  Resp: 18  Temp: (!) 97.4 F (36.3 C)   Filed Weights   12/08/21 0900  Weight: 160 lb 12.8 oz (72.9 kg)    Physical Exam HENT:     Head: Normocephalic and atraumatic.     Mouth/Throat:     Pharynx: No oropharyngeal exudate.  Eyes:     Pupils: Pupils are equal, round, and reactive to light.  Cardiovascular:     Rate and Rhythm: Normal rate and regular rhythm.  Pulmonary:     Effort: No respiratory distress.     Breath sounds: No wheezing.     Comments: Decreased breath sounds bilaterally.  No wheeze or crackles Abdominal:     General: Bowel sounds are normal. There is no distension.     Palpations: Abdomen is soft. There is no mass.     Tenderness: There is no abdominal tenderness. There is no guarding or rebound.  Musculoskeletal:        General: No tenderness. Normal range of motion.     Cervical back: Normal range  of motion and neck supple.  Skin:    General: Skin is warm.  Neurological:     Mental Status: He is alert and oriented to person, place,  and time.  Psychiatric:        Mood and Affect: Affect normal.   LABORATORY DATA:  I have reviewed the data as listed Lab Results  Component Value Date   WBC 7.2 12/08/2021   HGB 12.6 (L) 12/08/2021   HCT 38.5 (L) 12/08/2021   MCV 105.5 (H) 12/08/2021   PLT 186 12/08/2021   Recent Labs    11/10/21 1053 11/18/21 1420 12/08/21 0906  NA 133* 139 137  K 3.4* 3.5 4.1  CL 103 103 106  CO2 $Re'22 28 26  'ktP$ GLUCOSE 288* 117* 127*  BUN $Re'15 9 12  'SzV$ CREATININE 0.94 0.87 0.86  CALCIUM 8.0* 8.6* 8.3*  GFRNONAA >60 >60 >60  PROT 8.0 8.4* 8.9*  ALBUMIN 2.6* 2.7* 2.6*  AST 40 36 41  ALT $Re'18 17 19  'cag$ ALKPHOS 170* 186* 191*  BILITOT 1.2 0.3 0.6    RADIOGRAPHIC STUDIES: I have personally reviewed the radiological images as listed and agreed with the findings in the report. MR ABDOMEN WWO CONTRAST  Result Date: 11/15/2021 CLINICAL DATA:  Metastatic colon cancer. History of right hemicolectomy. History of bland embolization of liver metastases 12/19/2020, microwave ablation of left liver metastasis 01/15/2021 and Y-90 radioembolization of segments 6 and 8 right liver lesions 04/04/2021. Restaging. EXAM: MRI ABDOMEN WITHOUT AND WITH CONTRAST TECHNIQUE: Multiplanar multisequence MR imaging of the abdomen was performed both before and after the administration of intravenous contrast. CONTRAST:  7.46mL GADAVIST GADOBUTROL 1 MMOL/ML IV SOLN COMPARISON:  07/18/2021 MRI abdomen. 10/06/2021 CT chest, abdomen and pelvis. FINDINGS: Lower chest: Moderate left pleural effusion is similar to prior MRI. Multifocal bulky left pleural masses are not substantially changed. Representative 6.2 x 3.1 cm basilar left pleural mass (series 4/image 17), previously 6.0 x 3.0 cm on 07/18/2021 MRI. Representative 3.7 x 2.5 cm basilar left pleural mass (series 4/image 19), previously 3.6 x 2.5 cm.  Representative 3.1 x 2.3 cm posterior left pleural mass (series 4/image 7), previously 3.0 x 2.5 cm. Multiple scattered pulmonary nodules at both lung bases, new and increased since 07/18/2021 MRI and probably increased since 10/06/2021 chest CT, for example a 1.3 cm basilar right lower lobe pulmonary nodule (series 4/image 6), previously 0.8 cm on 10/06/2021 chest CT and new since 07/18/2021 MRI. Hepatobiliary: Numerous (at least 8) small hypoenhancing liver masses scattered throughout the liver are new or increased since 07/18/2021 MRI, the largest measuring 1.0 x 1.0 cm in the segment 7 right liver (series 15/image 21), increased from 0.5 x 0.4 cm on 07/18/2021 MRI. Representative 0.7 x 0.5 cm lesion in the segment 6 right liver (series 15/image 44), increased from 0.3 x 0.3 cm on 07/18/2021 MRI. Representative new 0.7 x 0.6 cm in the peripheral segment 8 right liver (series 15/image 19). Irregular 8.3 x 7.4 cm segment 8 right liver mass (series 20/image 15), previously 8.6 x 7.4 cm using similar measurement technique, not substantially changed in size, however with increased heterogeneous peripheral enhancement most prominent anteriorly (series 21/image 14). Irregular 8.3 x 6.0 cm anterior left liver mass (series 20/image 27), increased from 6.9 x 4.3 cm, with increased heterogeneous peripheral enhancement on the subtraction sequences. Ablated 2.3 x 2.0 cm segment 6 right liver mass (series 20/image 44), previously 2.3 x 2.2 cm, not appreciably changed, with similar thin relatively uniform mural enhancement. Far inferior right liver 2.3 x 1.9 cm capsular mass with thick irregular peripheral enhancement (series 20/image 55), previously 1.9 x 1.6 cm, mildly increased. No hepatic steatosis. Normal gallbladder with no cholelithiasis. Mild  central intrahepatic biliary ductal dilatation is mildly increased from 07/18/2021 MRI. Dilated common bile duct with diameter 14 mm, increased from 9 mm on 07/18/2021 MRI. No  evidence of choledocholithiasis. No discrete obstructing biliary mass. Pancreas: No pancreatic mass. Generalized pancreatic duct dilation up to 5 mm diameter, increased from 3 mm on 07/18/2021 MRI. No pancreas divisum. Spleen: Normal size. No mass. Adrenals/Urinary Tract: Heterogeneous enhancing 4.6 x 2.5 cm right adrenal mass (series 13/image 44), increased from 3.0 x 1.6 cm on 07/18/2021 MRI. No left adrenal mass. No hydronephrosis. Infiltrative anterior upper right renal cortical 4.5 x 4.4 cm heterogeneous enhancing mass (series 13/image 53), increased from 3.6 x 3.1 cm on 07/18/2021 MRI. Exophytic simple 4.7 cm lateral lower left renal cyst. Stomach/Bowel: Normal non-distended stomach. Partially visualized postsurgical changes from subtotal right hemicolectomy with ileocolic anastomosis in the right abdomen. No dilated small or large bowel loops. Vascular/Lymphatic: Atherosclerotic abdominal aorta with stable 3.3 cm infrarenal abdominal aortic aneurysm. The anterior upper right renal cortical mass either increasingly encases or invades the right renal vein. Enlarged necrotic pathologic retroperitoneal lymph nodes have increased since 07/18/2021 MRI. Representative 2.7 cm portacaval node (series 20/image 39), increased from 1.5 cm. Representative 2.3 cm retrocaval node (series 20/image 46), increased from 1.7 cm. Representative 1.6 cm aortocaval node (series 20/image 63), increased from 1.4 cm. Other: Trace perihepatic ascites is similar. No focal fluid collections. Enhancing 1.0 cm right peritoneal soft tissue mass near the inferior right liver (series 20/image 59), increased from 0.6 cm on 07/18/2021 MRI. Enhancing 2.1 cm anterior right peritoneal soft tissue mass (series 20/image 67), increased from 1.4 cm on 07/18/2021 MRI. Heterogeneous enhancing 3.8 x 3.2 cm posterior right peritoneal mass (series 22/image 70), mildly increased from 3.4 x 2.8 cm on 07/18/2021 MRI. Musculoskeletal: No aggressive appearing  focal osseous lesions. IMPRESSION: 1. Significant interval progression of widespread metastatic disease since 07/18/2021 MRI. 2. Numerous (at least 8) new or enlarging small liver metastases. Recurrent viable malignancy within previously treated large segment 8 and anterior left liver metastases. 3. Progressive metastatic retroperitoneal lymphadenopathy. 4. Progressive multifocal right peritoneal metastases. 5. Progressive right adrenal and upper right renal metastases. The upper right renal metastasis either increasingly encases or invades the right renal vein. 6. Progressive pulmonary metastases at the lung bases. 7. Multifocal bulky left pleural metastases and moderate left pleural effusion, not substantially changed. 8. Mild central intrahepatic and more prominent extrahepatic biliary ductal dilatation and mild pancreatic duct dilation, increased from 07/18/2021 MRI. No discrete obstructing biliary mass. No choledocholithiasis. Correlate with serum bilirubin levels and with any opioid analgesic use. 9. Stable trace perihepatic ascites. Electronically Signed   By: Ilona Sorrel M.D.   On: 11/15/2021 16:24   IR Radiologist Eval & Mgmt  Result Date: 11/19/2021 Please refer to notes tab for details about interventional procedure. (Op Note)    ASSESSMENT & PLAN:   Cancer of right colon ([place]) #Right-sided colon adenocarcinoma-with synchronous metastasis to liver/unresectabls;  CT scan dec 2022-progression of disease noted with increasing lung nodules/left moderate pleural effusion; new liver lesion; soft tissue implant anterior to the right kidney ~ 5cm in size. MRI JAN 2023- Numerous (at least 8) new or enlarging small liver metastases; Recurrent viable malignancy within previously treated large segment 8 and anterior left liver metastases;  Progressive metastatic retroperitoneal lymphadenopathy;  Progressive multifocal right peritoneal metastases; Progressive right adrenal and upper right renal metastases.  The upper right renal metastasis either increasingly encases or invades the right renal vein; Progressive pulmonary metastases at the  lung bases;  Multifocal bulky left pleural metastases and moderate left pleural effusion, not substantially changed.  #Patient currently on Stivarga 2 pills a day-3 weeks on 1 week off.  Hold off any dose escalation given ongoing fatigue/poor tolerance in general.  Discussed with Ebony Hail.  # Right upper quadrant pain-secondary to malignancy-s/p evaluation with [name] palliative care; continue oxycodone 5 mg up to 3 pills a day as needed.  STABLE.  #Weight loss: Recommend low-dose prednisone 20 mg a day; we discussed the potential side effects including but not limited to leg swelling/steroid myopathy; which is extremely rare given small dose of therapy.  # Mediport- functioning-STABLE>  #Prognosis/counseling: Again discussed the prognosis with the patient/son.  Again reviewed that patient has stage IV cancer which unfortunately cannot be cured.  Discussed that the overall very limited/modest response to Stivarga-ongoing side effects.  Clinically do not see any obvious response disease.  In fact suspicious of progressive disease-given patient's ongoing weight loss/rising alkaline phosphatase.  With regards to patient's son regarding alternative options-mushroom therapy/marijuana smoking.  Discussed that I am not aware of any clinical protocols of treating patients with mushroom therapy.  We will be happy to refer patient to tertiary care center for further evaluation of clinical trials.  He declined.  Discussed that if patient has significant side effects or continues to have declining performance status-hospice would be recommended.  # DISPOSITION:   # follow up in 3 weeks-  MD; labs- cbc/cmp CEA--Dr.B       All questions were answered. The patient knows to call the clinic with any problems, questions or concerns.    Cammie Sickle, MD 12/08/2021 1:02  PM

## 2021-12-08 NOTE — Assessment & Plan Note (Addendum)
#  Right-sided colon adenocarcinoma-with synchronous metastasis to liver/unresectabls;  CT scan dec 2022-progression of disease noted with increasing lung nodules/left moderate pleural effusion; new liver lesion; soft tissue implant anterior to the right kidney ~ 5cm in size. MRI JAN 2023- Numerous (at least 8) new or enlarging small liver metastases; Recurrent viable malignancy within previously treated large segment 8 and anterior left liver metastases;  Progressive metastatic retroperitoneal lymphadenopathy;  Progressive multifocal right peritoneal metastases; Progressive right adrenal and upper right renal metastases. The upper right renal metastasis either increasingly encases or invades the right renal vein; Progressive pulmonary metastases at the lung bases;  Multifocal bulky left pleural metastases and moderate left pleural effusion, not substantially changed.  #Patient currently on Stivarga 2 pills a day-3 weeks on 1 week off.  Hold off any dose escalation given ongoing fatigue/poor tolerance in general.  Discussed with Ebony Hail.  # Right upper quadrant pain-secondary to malignancy-s/p evaluation with [name] palliative care; continue oxycodone 5 mg up to 3 pills a day as needed.  STABLE.  #Weight loss: Recommend low-dose prednisone 20 mg a day; we discussed the potential side effects including but not limited to leg swelling/steroid myopathy; which is extremely rare given small dose of therapy.  # Mediport- functioning-STABLE>  #Prognosis/counseling: Again discussed the prognosis with the patient/son.  Again reviewed that patient has stage IV cancer which unfortunately cannot be cured.  Discussed that the overall very limited/modest response to Stivarga-ongoing side effects.  Clinically do not see any obvious response disease.  In fact suspicious of progressive disease-given patient's ongoing weight loss/rising alkaline phosphatase.  With regards to patient's son regarding alternative options-mushroom  therapy/marijuana smoking.  Discussed that I am not aware of any clinical protocols of treating patients with mushroom therapy.  We will be happy to refer patient to tertiary care center for further evaluation of clinical trials.  He declined.  Discussed that if patient has significant side effects or continues to have declining performance status-hospice would be recommended.  # DISPOSITION:  # follow up in 3 weeks-  MD; labs- cbc/cmp CEA--Dr.B

## 2021-12-08 NOTE — Progress Notes (Signed)
[facility]  [phone] [phone]  Patient Care Team: Sofie Hartigan, MD as PCP - General (Family Medicine) Cammie Sickle, MD as Consulting Physician (Hematology and Oncology)   Name of the patient: Oscar [name]  [id number]  [birth year]   Date of visit: 12/08/21  HPI: Patient is a 81 y.o. male with progressive metastatic colon adenocarcinoma. Planned treatment with Stivarga (regorafenib).  Reason for Consult: Oral chemotherapy follow-up for regorafenib therapy.   PAST MEDICAL HISTORY: Past Medical History:  Diagnosis Date   Arthritis    Cancer ([place])    colon cancer 02/2019 per pt    Family history of adverse reaction to anesthesia    cousin took all night to wake up from anesthesia   H/O colon cancer, stage IV    Hyperlipemia    Neuromuscular disorder [facility])    Pre-diabetes     HEMATOLOGY/ONCOLOGY HISTORY:  Oncology History Overview Note  # MAY 2020- 3. 03/09/19 Liver biopsy. Microscopic examination shows malignant cells with glandular architecture consistent with adenocarcinoma. The malignant cells are positive for CK20 and CDX-2. These findings support the clinical impression of metastatic colon adenocarcinoma. 4. 03/10/19 R hemicolectomy. Tumor site cecum. Adenocarcinoma. Mucinous features present. G2. No tumor deposits. Invades visceral peritoneum. No tumor perforation. LVI present. PNI not identified. All margins uninvolved. 1/12 LNs. PT4apN1. Periappendiceal inflammation c/w resolving abscess. Microsatellite stable (MSS). [Dr.Mettu; [facility]]  # SEP 4th 2020 [compared to May 2020]  Interval increase in size of the metastases to the hepatic dome, The metastasis to the left hepatic lobe is unchanged; 2.  New subcentimeter hypoattenuating lesion in the inferior right hepatic lobe, incompletely characterized on CT. 3.  Postsurgical changes following right hemicolectomy.  # OCT 2020- FOLFOX +avastin; CT dec  22nd 2020- [compared to [facility] sep 9th 2020]-Liver- slight progression versus stable disease; CT scan SEP 4th 2021- Progressive disease-left lower lobe lung nodule 8 mm [previously 4 mm]; increase in size of the hepatic metastases by few millimeters.;  Soft tissue nodule adjacent to anastomotic site again increased by few millimeters.STOP FOLFOX; cont avastin  #  SEP 20th, 2021- FOLFIRI+ AVASTIN; HOLD FEB 2022- sec to poor tolerance [peptic ulcer]; 3/30-liver ablation-   [bland embolization of segment 2/3 liver lesion on 12/19/2020;  microwave ablation of both liver lesions.  # ?July 25th, 2022- BEV _ TAS 102.MAY 2022- s/p Liver ablation  # DEC 2022-progressive disease-lung and liver/abdomen; JAN 31st 2023-START Regorefenib.   # FEB /16-EGD [Dr.Anna-duodenal ulcer-? meloxicam]  # JAN 2022- COVID [s/p Mab infusion; pills; skin rash resolved.]  # NGS/F-ONE-MUTATED K-RAS [G]  # PALLIATIVE CARE EVALUATION: 09/20/2019-[name]  # PAIN MANAGEMENT: NA   DIAGNOSIS: COLON CANCER  STAGE:  IV     ;  GOALS:Palliative      Cancer of right colon ([place])  07/05/2019 Initial Diagnosis   Cancer of right colon ([place])   07/24/2019 - 06/25/2020 Chemotherapy   The patient had dexamethasone (DECADRON) 4 MG tablet, 8 mg, Oral, Daily, 1 of 1 cycle, Start date: --, End date: -- palonosetron (ALOXI) injection 0.25 mg, 0.25 mg, Intravenous,  Once, 23 of 25 cycles Administration: 0.25 mg (07/24/2019), 0.25 mg (08/07/2019), 0.25 mg (08/21/2019), 0.25 mg (09/04/2019), 0.25 mg (09/20/2019), 0.25 mg (10/04/2019), 0.25 mg (10/16/2019), 0.25 mg (10/30/2019), 0.25 mg (11/22/2019), 0.25 mg (12/06/2019), 0.25 mg (12/20/2019), 0.25 mg (01/03/2020), 0.25 mg (01/29/2020), 0.25 mg (02/12/2020), 0.25 mg (02/26/2020), 0.25 mg (03/11/2020), 0.25 mg (03/25/2020), 0.25 mg (04/08/2020), 0.25 mg (04/24/2020), 0.25 mg (05/08/2020), 0.25  mg (05/22/2020), 0.25 mg (06/10/2020), 0.25 mg (06/25/2020) leucovorin 800 mg in dextrose 5 % 250 mL infusion, 844 mg, Intravenous,   Once, 23 of 25 cycles Administration: 800 mg (07/24/2019), 800 mg (08/07/2019), 800 mg (08/21/2019), 800 mg (09/04/2019), 800 mg (09/20/2019), 800 mg (10/04/2019), 800 mg (10/16/2019), 800 mg (10/30/2019), 800 mg (11/22/2019), 800 mg (12/06/2019), 800 mg (12/20/2019), 800 mg (01/03/2020), 800 mg (01/29/2020), 800 mg (02/12/2020), 800 mg (02/26/2020), 800 mg (03/11/2020), 800 mg (03/25/2020), 800 mg (04/08/2020), 800 mg (04/24/2020), 800 mg (05/08/2020), 800 mg (05/22/2020), 800 mg (06/10/2020), 800 mg (06/25/2020) oxaliplatin (ELOXATIN) 180 mg in dextrose 5 % 500 mL chemo infusion, 85 mg/m2 = 180 mg, Intravenous,  Once, 23 of 25 cycles Dose modification: 178 mg (original dose 85 mg/m2, Cycle 17, Reason: Other (see comments), Comment: insurance adjusted dose ) Administration: 180 mg (07/24/2019), 180 mg (08/07/2019), 180 mg (08/21/2019), 180 mg (09/04/2019), 180 mg (09/20/2019), 180 mg (10/04/2019), 180 mg (10/16/2019), 180 mg (10/30/2019), 180 mg (11/22/2019), 180 mg (12/06/2019), 180 mg (12/20/2019), 180 mg (01/03/2020), 180 mg (01/29/2020), 180 mg (02/12/2020), 180 mg (02/26/2020), 180 mg (03/11/2020), 180 mg (04/08/2020), 180 mg (04/24/2020), 180 mg (05/08/2020), 180 mg (05/22/2020), 180 mg (06/10/2020), 180 mg (06/25/2020) fluorouracil (ADRUCIL) 5,000 mg in sodium chloride 0.9 % 150 mL chemo infusion, 5,050 mg, Intravenous, 1 Day/Dose, 23 of 25 cycles Administration: 5,000 mg (07/24/2019), 5,000 mg (08/07/2019), 5,000 mg (08/21/2019), 5,050 mg (09/04/2019), 5,000 mg (10/04/2019), 5,000 mg (10/16/2019), 5,000 mg (11/22/2019), 5,000 mg (12/06/2019), 5,000 mg (12/20/2019), 5,000 mg (01/03/2020), 5,000 mg (01/29/2020), 5,000 mg (02/12/2020), 5,000 mg (02/26/2020), 5,000 mg (03/11/2020), 5,000 mg (03/25/2020), 5,000 mg (04/08/2020), 5,000 mg (04/24/2020), 5,000 mg (05/08/2020), 5,000 mg (05/22/2020), 5,000 mg (06/10/2020), 5,000 mg (06/25/2020) bevacizumab-bvzr (ZIRABEV) 400 mg in sodium chloride 0.9 % 100 mL chemo infusion, 5 mg/kg = 400 mg, Intravenous,  Once, 23 of 25  cycles Administration: 400 mg (08/07/2019), 400 mg (08/21/2019), 400 mg (09/04/2019), 400 mg (09/20/2019), 400 mg (10/04/2019), 400 mg (10/16/2019), 400 mg (10/30/2019), 400 mg (11/22/2019), 400 mg (12/06/2019), 400 mg (12/20/2019), 400 mg (01/03/2020), 400 mg (01/29/2020), 400 mg (02/12/2020), 400 mg (02/26/2020), 400 mg (03/11/2020), 400 mg (03/25/2020), 400 mg (04/08/2020), 400 mg (04/24/2020), 400 mg (05/08/2020), 400 mg (05/22/2020), 400 mg (06/10/2020), 400 mg (06/25/2020)   for chemotherapy treatment.     07/08/2020 -  Chemotherapy   Patient is on Treatment Plan : COLORECTAL FOLFIRI / BEVACIZUMAB Q14D     11/05/2020 Cancer Staging   Staging form: Colon and Rectum, AJCC 8th Edition - Clinical: Stage IVC (pM1c) - Signed by Cammie Sickle, MD on 11/05/2020      ALLERGIES:  is allergic to ace inhibitors.  MEDICATIONS:  Current Outpatient Medications  Medication Sig Dispense Refill   amLODipine (NORVASC) 5 MG tablet Take 5 mg by mouth daily. (Patient not taking: Reported on 10/03/2021)     ondansetron (ZOFRAN) 4 MG tablet Take 1 tablet (4 mg total) by mouth every 8 (eight) hours as needed for nausea or vomiting. (Patient not taking: Reported on 02/24/2021) 20 tablet 0   ondansetron (ZOFRAN) 8 MG tablet Take 1 tablet (8 mg total) by mouth every 8 (eight) hours as needed for nausea or vomiting. (Patient not taking: Reported on 02/24/2021) 20 tablet 0   oxyCODONE (OXY IR/ROXICODONE) 5 MG immediate release tablet Take 1 tablet (5 mg total) by mouth every 6 (six) hours as needed for severe pain. 60 tablet 0   polyethylene glycol powder (MIRALAX) 17 GM/SCOOP powder Take by mouth as needed.  pravastatin (PRAVACHOL) 40 MG tablet Take 40 mg by mouth daily.  (Patient not taking: Reported on 06/09/2021)     predniSONE (DELTASONE) 20 MG tablet Take 1 tablet (20 mg total) by mouth daily with breakfast. 30 tablet 1   prochlorperazine (COMPAZINE) 10 MG tablet Take 1 tablet (10 mg total) by mouth every 6 (six) hours as  needed for nausea or vomiting. (Patient not taking: Reported on 01/28/2021) 60 tablet 0   regorafenib (STIVARGA) 40 MG tablet Take 2 tablets (80mg ) by mouth daily for 7 days, then 3 tablets (120mg ) daily for 7 days, then 4 tablets (160mg ) daily for 7 days, then hold for 7 days.Take with low fat meal (breakfast). 63 tablet 0   senna (SENOKOT) 8.6 MG TABS tablet Take 1 tablet (8.6 mg total) by mouth daily. 120 tablet 0   sucralfate (CARAFATE) 1 g tablet Take 1 tablet (1 g total) by mouth 4 (four) times daily -  with meals and at bedtime. (Patient not taking: Reported on 11/18/2021) 90 tablet 3   tamsulosin (FLOMAX) 0.4 MG CAPS capsule Take 1 capsule (0.4 mg total) by mouth daily. 30 capsule 11   No current facility-administered medications for this visit.    VITAL SIGNS: There were no vitals taken for this visit. There were no vitals filed for this visit.  Estimated body mass index is 21.81 kg/m as calculated from the following:   Height as of 10/28/21: 6' (1.829 m).   Weight as of an earlier encounter on 12/08/21: 72.9 kg (160 lb 12.8 oz).  LABS: CBC:    Component Value Date/Time   WBC 7.2 12/08/2021 0906   HGB 12.6 (L) 12/08/2021 0906   HGB 13.3 09/18/2013 0424   HCT 38.5 (L) 12/08/2021 0906   HCT 38.1 (L) 09/18/2013 0424   PLT 186 12/08/2021 0906   PLT 307 09/18/2013 0424   MCV 105.5 (H) 12/08/2021 0906   MCV 94 09/18/2013 0424   NEUTROABS 5.0 12/08/2021 0906   NEUTROABS 17.0 (H) 09/18/2013 0424   LYMPHSABS 1.0 12/08/2021 0906   LYMPHSABS 0.9 (L) 09/18/2013 0424   MONOABS 0.6 12/08/2021 0906   MONOABS 0.4 09/18/2013 0424   EOSABS 0.5 12/08/2021 0906   EOSABS 0.0 09/18/2013 0424   BASOSABS 0.1 12/08/2021 0906   BASOSABS 0.0 09/18/2013 0424   Comprehensive Metabolic Panel:    Component Value Date/Time   NA 137 12/08/2021 0906   NA 137 09/18/2013 0424   K 4.1 12/08/2021 0906   K 4.5 09/18/2013 0424   CL 106 12/08/2021 0906   CL 107 09/18/2013 0424   CO2 26 12/08/2021 0906    CO2 28 09/18/2013 0424   BUN 12 12/08/2021 0906   BUN 19 (H) 09/18/2013 0424   CREATININE 0.86 12/08/2021 0906   CREATININE 1.15 09/18/2013 0424   GLUCOSE 127 (H) 12/08/2021 0906   GLUCOSE 196 (H) 09/18/2013 0424   CALCIUM 8.3 (L) 12/08/2021 0906   CALCIUM 9.1 09/18/2013 0424   AST 41 12/08/2021 0906   ALT 19 12/08/2021 0906   ALKPHOS 191 (H) 12/08/2021 0906   BILITOT 0.6 12/08/2021 0906   PROT 8.9 (H) 12/08/2021 0906   ALBUMIN 2.6 (L) 12/08/2021 0906     Present during today's visit: patient and his son  Assessment and Plan: Continue regorafenib 80mg , 21 days on/7 days off Tomorrow 12/09/21 will be day 21, he will hold his regorafenib for 7 days then resume on 12/17/21 Patient's son did mention a beach trip around the time his cycle is  to start and wondered if he should delay restarting. Left this choice up to the patient and he elected to stay on schedule. He knows to call for his appts to be adjusted if he changes his mind Updated medication calendar placed in the mail to Mr. Elisabeth Cara today   Oral Chemotherapy Side Effect/Intolerance:  Fatigue: present, but no worse than when on [place], he is able to keep up with his normal daily activites Nausea: patient took his ondansetron prior his first restart dose of regorafenib, but then stopped taking it as a premedication. He has not needed to use any prn anti-emetics Constipation: (likely related to pain medication) he is using miralax/senna prn. He reported today that he was starting th have to strain to have a bowel movement again. Suggested the start using his Miralax daily to prevent himself from getting backed up again  No reported nausea, diarrhea, or hand-foot syndrome  Oral Chemotherapy Adherence: no missed doses reported No patient barriers to medication adherence identified.   New medications: none reported  Medication Access Issues: No issues, patient was approved for manufacturer assistance until 10/18/22. He states he  has plenty of medication on hand. [facility] today  Patient expressed understanding and was in agreement with this plan. He also understands that He can call clinic at any time with any questions, concerns, or complaints.   Follow-up plan: RTC in 3 weeks  Thank you for allowing me to participate in the care of this very pleasant patient.   Time Total: 15 mins  Visit consisted of counseling and education on dealing with issues of symptom management in the setting of serious and potentially life-threatening illness.Greater than 50%  of this time was spent counseling and coordinating care related to the above assessment and plan.  Signed by: Darl Pikes, PharmD, BCPS, Salley Slaughter, CPP Hematology/Oncology Clinical Pharmacist Practitioner [place]/DB/AP [facility] [phone]  12/08/2021 12:51 PM

## 2021-12-08 NOTE — Progress Notes (Signed)
[place]  [phone] [phone]   Name: Oscar [name] Date: 12/08/2021 MRN: [id number]  DOB: [birth year]  Patient Care Team: Sofie Hartigan, MD as PCP - General (Family Medicine) Cammie Sickle, MD as Consulting Physician (Hematology and Oncology)    REASON FOR CONSULTATION: Palliative Care consult requested for this 81 y.o. male with multiple medical problems including stage IV colon cancer.  Patient was referred to palliative care to help address goals and manage ongoing symptoms.  Patient saw Dr. Rogue Bussing 10/28/2021.  Decision was made to discontinue TS 102+ Bev given disease progression.  Patient was started on regorafenib (started on 11/04/2021).    MRI on 11/14/2021 revealed significant interval progression of widespread metastatic disease.  Plan is to continue [place] but prognosis is felt to be quite limited, only likely a few months.  In the event of decline or further disease progression, hospice has been discussed as a recommendation.  SOCIAL HISTORY:     reports that he has been smoking cigarettes. He has a 3.75 pack-year smoking history. He has never used smokeless tobacco. He reports current alcohol use. He reports that he does not use drugs.   Patient is married.  He and his wife live with his stepdaughter.  His wife has advanced dementia and is followed at home by hospice.  Patient has a son and daughter and two twin stepdaughters.  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  ADVANCE DIRECTIVES:  Does not have  CODE STATUS: DNR/DNI (MOST form completed on 11/02/2019)  PAST MEDICAL HISTORY: Past Medical History:  Diagnosis Date   Arthritis    Cancer ([place])    colon cancer 02/2019 per pt    Family history of adverse reaction to anesthesia    cousin took all night to wake up from anesthesia   H/O colon cancer, stage IV    Hyperlipemia    Neuromuscular disorder ([place])     Pre-diabetes     PAST SURGICAL HISTORY:  Past Surgical History:  Procedure Laterality Date   COLON SURGERY     ESOPHAGOGASTRODUODENOSCOPY (EGD) WITH PROPOFOL N/A 12/26/2020   Procedure: ESOPHAGOGASTRODUODENOSCOPY (EGD) WITH PROPOFOL;  Surgeon: [name], MD;  Location: [facility] ENDOSCOPY;  Service: Gastroenterology;  Laterality: N/A;   IR ANGIOGRAM SELECTIVE EACH ADDITIONAL VESSEL  12/19/2020   IR ANGIOGRAM SELECTIVE EACH ADDITIONAL VESSEL  03/24/2021   IR ANGIOGRAM SELECTIVE EACH ADDITIONAL VESSEL  03/24/2021   IR ANGIOGRAM SELECTIVE EACH ADDITIONAL VESSEL  04/04/2021   IR ANGIOGRAM SELECTIVE EACH ADDITIONAL VESSEL  04/04/2021   IR ANGIOGRAM SELECTIVE EACH ADDITIONAL VESSEL  04/04/2021   IR ANGIOGRAM VISCERAL SELECTIVE  12/19/2020   IR ANGIOGRAM VISCERAL SELECTIVE  03/24/2021   IR ANGIOGRAM VISCERAL SELECTIVE  04/04/2021   IR ANGIOGRAM VISCERAL SELECTIVE  04/04/2021   IR EMBO ARTERIAL NOT HEMORR HEMANG INC GUIDE ROADMAPPING  03/24/2021   IR EMBO TUMOR ORGAN ISCHEMIA INFARCT INC GUIDE ROADMAPPING  12/19/2020   IR EMBO TUMOR ORGAN ISCHEMIA INFARCT INC GUIDE ROADMAPPING  04/04/2021   IR IMAGING GUIDED PORT INSERTION  07/20/2019   IR RADIOLOGIST EVAL & MGMT  12/05/2020   IR RADIOLOGIST EVAL & MGMT  02/11/2021   IR RADIOLOGIST EVAL & MGMT  03/04/2021   IR RADIOLOGIST EVAL & MGMT  04/29/2021   IR RADIOLOGIST EVAL & MGMT  07/22/2021   IR RADIOLOGIST EVAL & MGMT  11/19/2021   IR US GUIDE VASC ACCESS RIGHT  12/19/2020   IR US GUIDE  VASC ACCESS RIGHT  03/24/2021   IR US GUIDE VASC ACCESS RIGHT  04/04/2021   JOINT REPLACEMENT Left 2010   Knee   RADIOLOGY WITH ANESTHESIA N/A 01/15/2021   Procedure: CT WITH ANESTHESIA MICROWAVE ABLATION OF LIVER;  Surgeon: Criselda Peaches, MD;  Location: [facility] ORS;  Service: Anesthesiology;  Laterality: N/A;    HEMATOLOGY/ONCOLOGY HISTORY:  Oncology History Overview Note  # MAY 2020- 3. 03/09/19 Liver biopsy. Microscopic examination shows malignant cells with glandular architecture  consistent with adenocarcinoma. The malignant cells are positive for CK20 and CDX-2. These findings support the clinical impression of metastatic colon adenocarcinoma. 4. 03/10/19 R hemicolectomy. Tumor site cecum. Adenocarcinoma. Mucinous features present. G2. No tumor deposits. Invades visceral peritoneum. No tumor perforation. LVI present. PNI not identified. All margins uninvolved. 1/12 LNs. PT4apN1. Periappendiceal inflammation c/w resolving abscess. Microsatellite stable (MSS). [Dr.Mettu; [facility]]  # SEP 4th 2020 [compared to May 2020]  Interval increase in size of the metastases to the hepatic dome, The metastasis to the left hepatic lobe is unchanged; 2.  New subcentimeter hypoattenuating lesion in the inferior right hepatic lobe, incompletely characterized on CT. 3.  Postsurgical changes following right hemicolectomy.  # OCT 2020- FOLFOX +avastin; CT dec 22nd 2020- [compared to [facility] sep 9th 2020]-Liver- slight progression versus stable disease; CT scan SEP 4th 2021- Progressive disease-left lower lobe lung nodule 8 mm [previously 4 mm]; increase in size of the hepatic metastases by few millimeters.;  Soft tissue nodule adjacent to anastomotic site again increased by few millimeters.STOP FOLFOX; cont avastin  #  SEP 20th, 2021- FOLFIRI+ AVASTIN; HOLD FEB 2022- sec to poor tolerance [peptic ulcer]; 3/30-liver ablation-   [bland embolization of segment 2/3 liver lesion on 12/19/2020;  microwave ablation of both liver lesions.  # ?July 25th, 2022- BEV _ TAS 102.MAY 2022- s/p Liver ablation  # DEC 2022-progressive disease-lung and liver/abdomen; JAN 31st 2023-START Regorefenib.   # FEB /16-EGD [Dr.Anna-duodenal ulcer-? meloxicam]  # JAN 2022- COVID [s/p Mab infusion; pills; skin rash resolved.]  # NGS/F-ONE-MUTATED K-RAS [G]  # PALLIATIVE CARE EVALUATION: 09/20/2019-[name]  # PAIN MANAGEMENT: NA   DIAGNOSIS: COLON CANCER  STAGE:  IV     ;  GOALS:Palliative      Cancer of right colon ([place])   07/05/2019 Initial Diagnosis   Cancer of right colon ([place])   07/24/2019 - 06/25/2020 Chemotherapy   The patient had dexamethasone (DECADRON) 4 MG tablet, 8 mg, Oral, Daily, 1 of 1 cycle, Start date: --, End date: -- palonosetron (ALOXI) injection 0.25 mg, 0.25 mg, Intravenous,  Once, 23 of 25 cycles Administration: 0.25 mg (07/24/2019), 0.25 mg (08/07/2019), 0.25 mg (08/21/2019), 0.25 mg (09/04/2019), 0.25 mg (09/20/2019), 0.25 mg (10/04/2019), 0.25 mg (10/16/2019), 0.25 mg (10/30/2019), 0.25 mg (11/22/2019), 0.25 mg (12/06/2019), 0.25 mg (12/20/2019), 0.25 mg (01/03/2020), 0.25 mg (01/29/2020), 0.25 mg (02/12/2020), 0.25 mg (02/26/2020), 0.25 mg (03/11/2020), 0.25 mg (03/25/2020), 0.25 mg (04/08/2020), 0.25 mg (04/24/2020), 0.25 mg (05/08/2020), 0.25 mg (05/22/2020), 0.25 mg (06/10/2020), 0.25 mg (06/25/2020) leucovorin 800 mg in dextrose 5 % 250 mL infusion, 844 mg, Intravenous,  Once, 23 of 25 cycles Administration: 800 mg (07/24/2019), 800 mg (08/07/2019), 800 mg (08/21/2019), 800 mg (09/04/2019), 800 mg (09/20/2019), 800 mg (10/04/2019), 800 mg (10/16/2019), 800 mg (10/30/2019), 800 mg (11/22/2019), 800 mg (12/06/2019), 800 mg (12/20/2019), 800 mg (01/03/2020), 800 mg (01/29/2020), 800 mg (02/12/2020), 800 mg (02/26/2020), 800 mg (03/11/2020), 800 mg (03/25/2020), 800 mg (04/08/2020), 800 mg (04/24/2020), 800 mg (05/08/2020), 800 mg (05/22/2020), 800 mg (06/10/2020), 800  mg (06/25/2020) oxaliplatin (ELOXATIN) 180 mg in dextrose 5 % 500 mL chemo infusion, 85 mg/m2 = 180 mg, Intravenous,  Once, 23 of 25 cycles Dose modification: 178 mg (original dose 85 mg/m2, Cycle 17, Reason: Other (see comments), Comment: insurance adjusted dose ) Administration: 180 mg (07/24/2019), 180 mg (08/07/2019), 180 mg (08/21/2019), 180 mg (09/04/2019), 180 mg (09/20/2019), 180 mg (10/04/2019), 180 mg (10/16/2019), 180 mg (10/30/2019), 180 mg (11/22/2019), 180 mg (12/06/2019), 180 mg (12/20/2019), 180 mg (01/03/2020), 180 mg (01/29/2020), 180 mg (02/12/2020), 180 mg (02/26/2020), 180 mg  (03/11/2020), 180 mg (04/08/2020), 180 mg (04/24/2020), 180 mg (05/08/2020), 180 mg (05/22/2020), 180 mg (06/10/2020), 180 mg (06/25/2020) fluorouracil (ADRUCIL) 5,000 mg in sodium chloride 0.9 % 150 mL chemo infusion, 5,050 mg, Intravenous, 1 Day/Dose, 23 of 25 cycles Administration: 5,000 mg (07/24/2019), 5,000 mg (08/07/2019), 5,000 mg (08/21/2019), 5,050 mg (09/04/2019), 5,000 mg (10/04/2019), 5,000 mg (10/16/2019), 5,000 mg (11/22/2019), 5,000 mg (12/06/2019), 5,000 mg (12/20/2019), 5,000 mg (01/03/2020), 5,000 mg (01/29/2020), 5,000 mg (02/12/2020), 5,000 mg (02/26/2020), 5,000 mg (03/11/2020), 5,000 mg (03/25/2020), 5,000 mg (04/08/2020), 5,000 mg (04/24/2020), 5,000 mg (05/08/2020), 5,000 mg (05/22/2020), 5,000 mg (06/10/2020), 5,000 mg (06/25/2020) bevacizumab-bvzr (ZIRABEV) 400 mg in sodium chloride 0.9 % 100 mL chemo infusion, 5 mg/kg = 400 mg, Intravenous,  Once, 23 of 25 cycles Administration: 400 mg (08/07/2019), 400 mg (08/21/2019), 400 mg (09/04/2019), 400 mg (09/20/2019), 400 mg (10/04/2019), 400 mg (10/16/2019), 400 mg (10/30/2019), 400 mg (11/22/2019), 400 mg (12/06/2019), 400 mg (12/20/2019), 400 mg (01/03/2020), 400 mg (01/29/2020), 400 mg (02/12/2020), 400 mg (02/26/2020), 400 mg (03/11/2020), 400 mg (03/25/2020), 400 mg (04/08/2020), 400 mg (04/24/2020), 400 mg (05/08/2020), 400 mg (05/22/2020), 400 mg (06/10/2020), 400 mg (06/25/2020)  for chemotherapy treatment.    07/08/2020 -  Chemotherapy   Patient is on Treatment Plan : COLORECTAL FOLFIRI / BEVACIZUMAB Q14D     11/05/2020 Cancer Staging   Staging form: Colon and Rectum, AJCC 8th Edition - Clinical: Stage IVC (pM1c) - Signed by Cammie Sickle, MD on 11/05/2020     ALLERGIES:  is allergic to ace inhibitors.  MEDICATIONS:  Current Outpatient Medications  Medication Sig Dispense Refill   amLODipine (NORVASC) 5 MG tablet Take 5 mg by mouth daily. (Patient not taking: Reported on 10/03/2021)     ondansetron (ZOFRAN) 4 MG tablet Take 1 tablet (4 mg total) by mouth every 8  (eight) hours as needed for nausea or vomiting. (Patient not taking: Reported on 02/24/2021) 20 tablet 0   ondansetron (ZOFRAN) 8 MG tablet Take 1 tablet (8 mg total) by mouth every 8 (eight) hours as needed for nausea or vomiting. (Patient not taking: Reported on 02/24/2021) 20 tablet 0   oxyCODONE (OXY IR/ROXICODONE) 5 MG immediate release tablet Take 1 tablet (5 mg total) by mouth every 6 (six) hours as needed for severe pain. 60 tablet 0   polyethylene glycol powder (MIRALAX) 17 GM/SCOOP powder Take by mouth as needed.     pravastatin (PRAVACHOL) 40 MG tablet Take 40 mg by mouth daily.  (Patient not taking: Reported on 06/09/2021)     prochlorperazine (COMPAZINE) 10 MG tablet Take 1 tablet (10 mg total) by mouth every 6 (six) hours as needed for nausea or vomiting. (Patient not taking: Reported on 01/28/2021) 60 tablet 0   regorafenib (STIVARGA) 40 MG tablet Take 2 tablets ($RemoveBe'80mg'agEmSbFbh$ ) by mouth daily for 7 days, then 3 tablets ($RemoveBe'120mg'omvHuGEWo$ ) daily for 7 days, then 4 tablets ($RemoveBe'160mg'vJwAUxbOM$ ) daily for 7 days, then hold for 7 days.Take with low  fat meal (breakfast). 63 tablet 0   senna (SENOKOT) 8.6 MG TABS tablet Take 1 tablet (8.6 mg total) by mouth daily. 120 tablet 0   sucralfate (CARAFATE) 1 g tablet Take 1 tablet (1 g total) by mouth 4 (four) times daily -  with meals and at bedtime. (Patient not taking: Reported on 11/18/2021) 90 tablet 3   tamsulosin (FLOMAX) 0.4 MG CAPS capsule Take 1 capsule (0.4 mg total) by mouth daily. 30 capsule 11   No current facility-administered medications for this visit.    VITAL SIGNS: There were no vitals taken for this visit. There were no vitals filed for this visit.  Estimated body mass index is 21.81 kg/m as calculated from the following:   Height as of 10/28/21: 6' (1.829 m).   Weight as of an earlier encounter on 12/08/21: 160 lb 12.8 oz (72.9 kg).  LABS: CBC:    Component Value Date/Time   WBC 7.2 12/08/2021 0906   HGB 12.6 (L) 12/08/2021 0906   HGB 13.3 09/18/2013 0424    HCT 38.5 (L) 12/08/2021 0906   HCT 38.1 (L) 09/18/2013 0424   PLT 186 12/08/2021 0906   PLT 307 09/18/2013 0424   MCV 105.5 (H) 12/08/2021 0906   MCV 94 09/18/2013 0424   NEUTROABS 5.0 12/08/2021 0906   NEUTROABS 17.0 (H) 09/18/2013 0424   LYMPHSABS 1.0 12/08/2021 0906   LYMPHSABS 0.9 (L) 09/18/2013 0424   MONOABS 0.6 12/08/2021 0906   MONOABS 0.4 09/18/2013 0424   EOSABS 0.5 12/08/2021 0906   EOSABS 0.0 09/18/2013 0424   BASOSABS 0.1 12/08/2021 0906   BASOSABS 0.0 09/18/2013 0424   Comprehensive Metabolic Panel:    Component Value Date/Time   NA 137 12/08/2021 0906   NA 137 09/18/2013 0424   K 4.1 12/08/2021 0906   K 4.5 09/18/2013 0424   CL 106 12/08/2021 0906   CL 107 09/18/2013 0424   CO2 26 12/08/2021 0906   CO2 28 09/18/2013 0424   BUN 12 12/08/2021 0906   BUN 19 (H) 09/18/2013 0424   CREATININE 0.86 12/08/2021 0906   CREATININE 1.15 09/18/2013 0424   GLUCOSE 127 (H) 12/08/2021 0906   GLUCOSE 196 (H) 09/18/2013 0424   CALCIUM 8.3 (L) 12/08/2021 0906   CALCIUM 9.1 09/18/2013 0424   AST 41 12/08/2021 0906   ALT 19 12/08/2021 0906   ALKPHOS 191 (H) 12/08/2021 0906   BILITOT 0.6 12/08/2021 0906   PROT 8.9 (H) 12/08/2021 0906   ALBUMIN 2.6 (L) 12/08/2021 0906    RADIOGRAPHIC STUDIES: MR ABDOMEN WWO CONTRAST  Result Date: 11/15/2021 CLINICAL DATA:  Metastatic colon cancer. History of right hemicolectomy. History of bland embolization of liver metastases 12/19/2020, microwave ablation of left liver metastasis 01/15/2021 and Y-90 radioembolization of segments 6 and 8 right liver lesions 04/04/2021. Restaging. EXAM: MRI ABDOMEN WITHOUT AND WITH CONTRAST TECHNIQUE: Multiplanar multisequence MR imaging of the abdomen was performed both before and after the administration of intravenous contrast. CONTRAST:  7.20mL GADAVIST GADOBUTROL 1 MMOL/ML IV SOLN COMPARISON:  07/18/2021 MRI abdomen. 10/06/2021 CT chest, abdomen and pelvis. FINDINGS: Lower chest: Moderate left pleural  effusion is similar to prior MRI. Multifocal bulky left pleural masses are not substantially changed. Representative 6.2 x 3.1 cm basilar left pleural mass (series 4/image 17), previously 6.0 x 3.0 cm on 07/18/2021 MRI. Representative 3.7 x 2.5 cm basilar left pleural mass (series 4/image 19), previously 3.6 x 2.5 cm. Representative 3.1 x 2.3 cm posterior left pleural mass (series 4/image 7), previously 3.0 x  2.5 cm. Multiple scattered pulmonary nodules at both lung bases, new and increased since 07/18/2021 MRI and probably increased since 10/06/2021 chest CT, for example a 1.3 cm basilar right lower lobe pulmonary nodule (series 4/image 6), previously 0.8 cm on 10/06/2021 chest CT and new since 07/18/2021 MRI. Hepatobiliary: Numerous (at least 8) small hypoenhancing liver masses scattered throughout the liver are new or increased since 07/18/2021 MRI, the largest measuring 1.0 x 1.0 cm in the segment 7 right liver (series 15/image 21), increased from 0.5 x 0.4 cm on 07/18/2021 MRI. Representative 0.7 x 0.5 cm lesion in the segment 6 right liver (series 15/image 44), increased from 0.3 x 0.3 cm on 07/18/2021 MRI. Representative new 0.7 x 0.6 cm in the peripheral segment 8 right liver (series 15/image 19). Irregular 8.3 x 7.4 cm segment 8 right liver mass (series 20/image 15), previously 8.6 x 7.4 cm using similar measurement technique, not substantially changed in size, however with increased heterogeneous peripheral enhancement most prominent anteriorly (series 21/image 14). Irregular 8.3 x 6.0 cm anterior left liver mass (series 20/image 27), increased from 6.9 x 4.3 cm, with increased heterogeneous peripheral enhancement on the subtraction sequences. Ablated 2.3 x 2.0 cm segment 6 right liver mass (series 20/image 44), previously 2.3 x 2.2 cm, not appreciably changed, with similar thin relatively uniform mural enhancement. Far inferior right liver 2.3 x 1.9 cm capsular mass with thick irregular peripheral  enhancement (series 20/image 55), previously 1.9 x 1.6 cm, mildly increased. No hepatic steatosis. Normal gallbladder with no cholelithiasis. Mild central intrahepatic biliary ductal dilatation is mildly increased from 07/18/2021 MRI. Dilated common bile duct with diameter 14 mm, increased from 9 mm on 07/18/2021 MRI. No evidence of choledocholithiasis. No discrete obstructing biliary mass. Pancreas: No pancreatic mass. Generalized pancreatic duct dilation up to 5 mm diameter, increased from 3 mm on 07/18/2021 MRI. No pancreas divisum. Spleen: Normal size. No mass. Adrenals/Urinary Tract: Heterogeneous enhancing 4.6 x 2.5 cm right adrenal mass (series 13/image 44), increased from 3.0 x 1.6 cm on 07/18/2021 MRI. No left adrenal mass. No hydronephrosis. Infiltrative anterior upper right renal cortical 4.5 x 4.4 cm heterogeneous enhancing mass (series 13/image 53), increased from 3.6 x 3.1 cm on 07/18/2021 MRI. Exophytic simple 4.7 cm lateral lower left renal cyst. Stomach/Bowel: Normal non-distended stomach. Partially visualized postsurgical changes from subtotal right hemicolectomy with ileocolic anastomosis in the right abdomen. No dilated small or large bowel loops. Vascular/Lymphatic: Atherosclerotic abdominal aorta with stable 3.3 cm infrarenal abdominal aortic aneurysm. The anterior upper right renal cortical mass either increasingly encases or invades the right renal vein. Enlarged necrotic pathologic retroperitoneal lymph nodes have increased since 07/18/2021 MRI. Representative 2.7 cm portacaval node (series 20/image 39), increased from 1.5 cm. Representative 2.3 cm retrocaval node (series 20/image 46), increased from 1.7 cm. Representative 1.6 cm aortocaval node (series 20/image 63), increased from 1.4 cm. Other: Trace perihepatic ascites is similar. No focal fluid collections. Enhancing 1.0 cm right peritoneal soft tissue mass near the inferior right liver (series 20/image 59), increased from 0.6 cm on  07/18/2021 MRI. Enhancing 2.1 cm anterior right peritoneal soft tissue mass (series 20/image 67), increased from 1.4 cm on 07/18/2021 MRI. Heterogeneous enhancing 3.8 x 3.2 cm posterior right peritoneal mass (series 22/image 70), mildly increased from 3.4 x 2.8 cm on 07/18/2021 MRI. Musculoskeletal: No aggressive appearing focal osseous lesions. IMPRESSION: 1. Significant interval progression of widespread metastatic disease since 07/18/2021 MRI. 2. Numerous (at least 8) new or enlarging small liver metastases. Recurrent viable malignancy within  previously treated large segment 8 and anterior left liver metastases. 3. Progressive metastatic retroperitoneal lymphadenopathy. 4. Progressive multifocal right peritoneal metastases. 5. Progressive right adrenal and upper right renal metastases. The upper right renal metastasis either increasingly encases or invades the right renal vein. 6. Progressive pulmonary metastases at the lung bases. 7. Multifocal bulky left pleural metastases and moderate left pleural effusion, not substantially changed. 8. Mild central intrahepatic and more prominent extrahepatic biliary ductal dilatation and mild pancreatic duct dilation, increased from 07/18/2021 MRI. No discrete obstructing biliary mass. No choledocholithiasis. Correlate with serum bilirubin levels and with any opioid analgesic use. 9. Stable trace perihepatic ascites. Electronically Signed   By: Ilona Sorrel M.D.   On: 11/15/2021 16:24   IR Radiologist Eval & Mgmt  Result Date: 11/19/2021 Please refer to notes tab for details about interventional procedure. (Op Note)    PERFORMANCE STATUS (ECOG) : 1 - Symptomatic but completely ambulatory  Review of Systems Unless otherwise noted, a complete review of systems is negative.  Physical Exam General: NAD, frail appearing, thin Pulmonary: Unlabored Extremities: no edema, no joint deformities Skin: no rashes Neurological: Weakness but otherwise  nonfocal  IMPRESSION: Follow-up visit.    Patient reports he is doing reasonably well and denies significant changes or concerns today.  He did have some constipation but this improved with MiraLAX/Senokot.  He reports stable pain taking oxycodone 3 times daily.  Patient request refill of oxycodone today.  Patient says he is taking a beach trip in the next week with family.  MOST form previously completed and patient is appropriately a DNR/DNI.  PLAN: -Continue current scope of treatment -Refill oxycodone #60 -DNR/DNI -RTC 1 month   Patient expressed understanding and was in agreement with this plan. He also understands that He can call the clinic at any time with any questions, concerns, or complaints.     Time Total: 15 minutes  Visit consisted of counseling and education dealing with the complex and emotionally intense issues of symptom management and palliative care in the setting of serious and potentially life-threatening illness.Greater than 50%  of this time was spent counseling and coordinating care related to the above assessment and plan.  Signed by: Altha Harm, PhD, NP-C

## 2021-12-09 LAB — CEA: CEA: 301 ng/mL — ABNORMAL HIGH (ref 0.0–4.7)

## 2021-12-15 ENCOUNTER — Encounter: Payer: Self-pay | Admitting: Internal Medicine

## 2021-12-17 ENCOUNTER — Telehealth: Payer: Self-pay

## 2021-12-17 NOTE — Telephone Encounter (Signed)
Pt sent a mychart message requesting a handicap placard. Form was filled out, signed by Dr. B, copy sent to scan. ? ?Pt notified, he ask that it be mailed to him, done. ?

## 2021-12-17 NOTE — Telephone Encounter (Signed)
Form filled out, dr. B to sign. ?

## 2021-12-29 ENCOUNTER — Other Ambulatory Visit: Payer: Self-pay

## 2021-12-29 ENCOUNTER — Inpatient Hospital Stay: Payer: Medicare HMO | Attending: Internal Medicine

## 2021-12-29 ENCOUNTER — Encounter: Payer: Self-pay | Admitting: Internal Medicine

## 2021-12-29 ENCOUNTER — Inpatient Hospital Stay: Payer: Medicare HMO | Admitting: Internal Medicine

## 2021-12-29 DIAGNOSIS — J90 Pleural effusion, not elsewhere classified: Secondary | ICD-10-CM | POA: Insufficient documentation

## 2021-12-29 DIAGNOSIS — G893 Neoplasm related pain (acute) (chronic): Secondary | ICD-10-CM | POA: Insufficient documentation

## 2021-12-29 DIAGNOSIS — C7800 Secondary malignant neoplasm of unspecified lung: Secondary | ICD-10-CM | POA: Diagnosis not present

## 2021-12-29 DIAGNOSIS — C182 Malignant neoplasm of ascending colon: Secondary | ICD-10-CM | POA: Diagnosis not present

## 2021-12-29 DIAGNOSIS — C787 Secondary malignant neoplasm of liver and intrahepatic bile duct: Secondary | ICD-10-CM | POA: Diagnosis not present

## 2021-12-29 DIAGNOSIS — C786 Secondary malignant neoplasm of retroperitoneum and peritoneum: Secondary | ICD-10-CM | POA: Insufficient documentation

## 2021-12-29 DIAGNOSIS — C782 Secondary malignant neoplasm of pleura: Secondary | ICD-10-CM | POA: Insufficient documentation

## 2021-12-29 DIAGNOSIS — F17210 Nicotine dependence, cigarettes, uncomplicated: Secondary | ICD-10-CM | POA: Insufficient documentation

## 2021-12-29 LAB — COMPREHENSIVE METABOLIC PANEL
ALT: 19 U/L (ref 0–44)
AST: 45 U/L — ABNORMAL HIGH (ref 15–41)
Albumin: 2.4 g/dL — ABNORMAL LOW (ref 3.5–5.0)
Alkaline Phosphatase: 180 U/L — ABNORMAL HIGH (ref 38–126)
Anion gap: 6 (ref 5–15)
BUN: 16 mg/dL (ref 8–23)
CO2: 25 mmol/L (ref 22–32)
Calcium: 8 mg/dL — ABNORMAL LOW (ref 8.9–10.3)
Chloride: 102 mmol/L (ref 98–111)
Creatinine, Ser: 1.22 mg/dL (ref 0.61–1.24)
GFR, Estimated: >60 mL/min (ref >60)
Glucose, Bld: 232 mg/dL — ABNORMAL HIGH (ref 70–99)
Potassium: 3.8 mmol/L (ref 3.5–5.1)
Sodium: 133 mmol/L — ABNORMAL LOW (ref 135–145)
Total Bilirubin: 1.5 mg/dL — ABNORMAL HIGH (ref 0.3–1.2)
Total Protein: 8.4 g/dL — ABNORMAL HIGH (ref 6.5–8.1)

## 2021-12-29 LAB — CBC WITH DIFFERENTIAL/PLATELET
Abs Immature Granulocytes: 0.03 10*3/uL (ref 0.00–0.07)
Basophils Absolute: 0 10*3/uL (ref 0.0–0.1)
Basophils Relative: 1 %
Eosinophils Absolute: 0.3 10*3/uL (ref 0.0–0.5)
Eosinophils Relative: 3 %
HCT: 36.4 % — ABNORMAL LOW (ref 39.0–52.0)
Hemoglobin: 11.8 g/dL — ABNORMAL LOW (ref 13.0–17.0)
Immature Granulocytes: 0 %
Lymphocytes Relative: 12 %
Lymphs Abs: 1 10*3/uL (ref 0.7–4.0)
MCH: 34 pg (ref 26.0–34.0)
MCHC: 32.4 g/dL (ref 30.0–36.0)
MCV: 104.9 fL — ABNORMAL HIGH (ref 80.0–100.0)
Monocytes Absolute: 0.8 10*3/uL (ref 0.1–1.0)
Monocytes Relative: 10 %
Neutro Abs: 6.2 10*3/uL (ref 1.7–7.7)
Neutrophils Relative %: 74 %
Platelets: 182 10*3/uL (ref 150–400)
RBC: 3.47 MIL/uL — ABNORMAL LOW (ref 4.22–5.81)
RDW: 13.6 % (ref 11.5–15.5)
WBC: 8.4 10*3/uL (ref 4.0–10.5)
nRBC: 0 % (ref 0.0–0.2)

## 2021-12-29 MED ORDER — OXYCODONE HCL 5 MG PO TABS
5.0000 mg | ORAL_TABLET | Freq: Four times a day (QID) | ORAL | 0 refills | Status: DC | PRN
Start: 2021-12-29 — End: 2022-01-21

## 2021-12-29 NOTE — Assessment & Plan Note (Addendum)
#  Right-sided colon adenocarcinoma-with synchronous metastasis to liver/unresectabls;  CT scan dec 2022-progression of disease noted with increasing lung nodules/left moderate pleural effusion; new liver lesion; soft tissue implant anterior to the right kidney ~ 5cm in size. MRI JAN 2023- Numerous (at least 8) new or enlarging small liver metastases; Recurrent viable malignancy within previously treated large segment 8 and anterior left liver metastases;  Progressive metastatic retroperitoneal lymphadenopathy;  Progressive multifocal right peritoneal metastases; Progressive right adrenal and upper right renal metastases. The upper right renal metastasis either increasingly encases or invades the right renal vein; Progressive pulmonary metastases at the lung bases;  Multifocal bulky left pleural metastases and moderate left pleural effusion, not substantially changed. ? ?#Patient currently on Stivarga 2 pills a day-3 weeks on 1 week off.  Hold off any dose escalation given ongoing fatigue/poor tolerance in general.  dsicussed that CEA has beein rising; will plan imaging in 1-2 months.  ? ?# Right upper quadrant pain-secondary to malignancy-s/p evaluation with [name]; continue oxycodone 5 mg up to 3-4 pills a day as needed.  STABLE. ? ?#Weight loss: wants to hold off low-dose prednisone 20 mg a day;  As eating "good"- weight stable. ? ?# Mediport- functioning-STABLE> ? ?# DISPOSITION: ? ?# follow up in 4 weeks-  MD; labs- cbc/cmp CEA--Dr.B ?

## 2021-12-29 NOTE — Progress Notes (Signed)
[place] NOTE  Patient Care Team: Sofie Hartigan, MD as PCP - General (Family Medicine) Cammie Sickle, MD as Consulting Physician (Hematology and Oncology)  CHIEF COMPLAINTS/PURPOSE OF CONSULTATION: Colon cancer  #  Oncology History Overview Note  # MAY 2020- 3. 03/09/19 Liver biopsy. Microscopic examination shows malignant cells with glandular architecture consistent with adenocarcinoma. The malignant cells are positive for CK20 and CDX-2. These findings support the clinical impression of metastatic colon adenocarcinoma. 4. 03/10/19 R hemicolectomy. Tumor site cecum. Adenocarcinoma. Mucinous features present. G2. No tumor deposits. Invades visceral peritoneum. No tumor perforation. LVI present. PNI not identified. All margins uninvolved. 1/12 LNs. PT4apN1. Periappendiceal inflammation c/w resolving abscess. Microsatellite stable (MSS). [Dr.Mettu; [facility]]  # SEP 4th 2020 [compared to May 2020]  Interval increase in size of the metastases to the hepatic dome, The metastasis to the left hepatic lobe is unchanged; 2.  New subcentimeter hypoattenuating lesion in the inferior right hepatic lobe, incompletely characterized on CT. 3.  Postsurgical changes following right hemicolectomy.  # OCT 2020- FOLFOX +avastin; CT dec 22nd 2020- [compared to [facility] sep 9th 2020]-Liver- slight progression versus stable disease; CT scan SEP 4th 2021- Progressive disease-left lower lobe lung nodule 8 mm [previously 4 mm]; increase in size of the hepatic metastases by few millimeters.;  Soft tissue nodule adjacent to anastomotic site again increased by few millimeters.STOP FOLFOX; cont avastin  #  SEP 20th, 2021- FOLFIRI+ AVASTIN; HOLD FEB 2022- sec to poor tolerance [peptic ulcer]; 3/30-liver ablation-   [bland embolization of segment 2/3 liver lesion on 12/19/2020;  microwave ablation of both liver lesions.  # ?July 25th, 2022- BEV _ TAS 102.MAY 2022- s/p Liver ablation  # DEC  2022-progressive disease-lung and liver/abdomen; JAN 31st 2023-START Regorefenib.   # FEB /16-EGD [Dr.Anna-duodenal ulcer-? meloxicam]  # JAN 2022- COVID [s/p Mab infusion; pills; skin rash resolved.]  # NGS/F-ONE-MUTATED K-RAS [G]       Cancer of right colon ([place])  07/05/2019 Initial Diagnosis   Cancer of right colon ([place])   07/24/2019 - 06/25/2020 Chemotherapy   The patient had dexamethasone (DECADRON) 4 MG tablet, 8 mg, Oral, Daily, 1 of 1 cycle, Start date: --, End date: -- palonosetron (ALOXI) injection 0.25 mg, 0.25 mg, Intravenous,  Once, 23 of 25 cycles Administration: 0.25 mg (07/24/2019), 0.25 mg (08/07/2019), 0.25 mg (08/21/2019), 0.25 mg (09/04/2019), 0.25 mg (09/20/2019), 0.25 mg (10/04/2019), 0.25 mg (10/16/2019), 0.25 mg (10/30/2019), 0.25 mg (11/22/2019), 0.25 mg (12/06/2019), 0.25 mg (12/20/2019), 0.25 mg (01/03/2020), 0.25 mg (01/29/2020), 0.25 mg (02/12/2020), 0.25 mg (02/26/2020), 0.25 mg (03/11/2020), 0.25 mg (03/25/2020), 0.25 mg (04/08/2020), 0.25 mg (04/24/2020), 0.25 mg (05/08/2020), 0.25 mg (05/22/2020), 0.25 mg (06/10/2020), 0.25 mg (06/25/2020) leucovorin 800 mg in dextrose 5 % 250 mL infusion, 844 mg, Intravenous,  Once, 23 of 25 cycles Administration: 800 mg (07/24/2019), 800 mg (08/07/2019), 800 mg (08/21/2019), 800 mg (09/04/2019), 800 mg (09/20/2019), 800 mg (10/04/2019), 800 mg (10/16/2019), 800 mg (10/30/2019), 800 mg (11/22/2019), 800 mg (12/06/2019), 800 mg (12/20/2019), 800 mg (01/03/2020), 800 mg (01/29/2020), 800 mg (02/12/2020), 800 mg (02/26/2020), 800 mg (03/11/2020), 800 mg (03/25/2020), 800 mg (04/08/2020), 800 mg (04/24/2020), 800 mg (05/08/2020), 800 mg (05/22/2020), 800 mg (06/10/2020), 800 mg (06/25/2020) oxaliplatin (ELOXATIN) 180 mg in dextrose 5 % 500 mL chemo infusion, 85 mg/m2 = 180 mg, Intravenous,  Once, 23 of 25 cycles Dose modification: 178 mg (original dose 85 mg/m2, Cycle 17, Reason: Other (see comments), Comment: insurance adjusted dose ) Administration: 180 mg (07/24/2019), 180  mg  (08/07/2019), 180 mg (08/21/2019), 180 mg (09/04/2019), 180 mg (09/20/2019), 180 mg (10/04/2019), 180 mg (10/16/2019), 180 mg (10/30/2019), 180 mg (11/22/2019), 180 mg (12/06/2019), 180 mg (12/20/2019), 180 mg (01/03/2020), 180 mg (01/29/2020), 180 mg (02/12/2020), 180 mg (02/26/2020), 180 mg (03/11/2020), 180 mg (04/08/2020), 180 mg (04/24/2020), 180 mg (05/08/2020), 180 mg (05/22/2020), 180 mg (06/10/2020), 180 mg (06/25/2020) fluorouracil (ADRUCIL) 5,000 mg in sodium chloride 0.9 % 150 mL chemo infusion, 5,050 mg, Intravenous, 1 Day/Dose, 23 of 25 cycles Administration: 5,000 mg (07/24/2019), 5,000 mg (08/07/2019), 5,000 mg (08/21/2019), 5,050 mg (09/04/2019), 5,000 mg (10/04/2019), 5,000 mg (10/16/2019), 5,000 mg (11/22/2019), 5,000 mg (12/06/2019), 5,000 mg (12/20/2019), 5,000 mg (01/03/2020), 5,000 mg (01/29/2020), 5,000 mg (02/12/2020), 5,000 mg (02/26/2020), 5,000 mg (03/11/2020), 5,000 mg (03/25/2020), 5,000 mg (04/08/2020), 5,000 mg (04/24/2020), 5,000 mg (05/08/2020), 5,000 mg (05/22/2020), 5,000 mg (06/10/2020), 5,000 mg (06/25/2020) bevacizumab-bvzr (ZIRABEV) 400 mg in sodium chloride 0.9 % 100 mL chemo infusion, 5 mg/kg = 400 mg, Intravenous,  Once, 23 of 25 cycles Administration: 400 mg (08/07/2019), 400 mg (08/21/2019), 400 mg (09/04/2019), 400 mg (09/20/2019), 400 mg (10/04/2019), 400 mg (10/16/2019), 400 mg (10/30/2019), 400 mg (11/22/2019), 400 mg (12/06/2019), 400 mg (12/20/2019), 400 mg (01/03/2020), 400 mg (01/29/2020), 400 mg (02/12/2020), 400 mg (02/26/2020), 400 mg (03/11/2020), 400 mg (03/25/2020), 400 mg (04/08/2020), 400 mg (04/24/2020), 400 mg (05/08/2020), 400 mg (05/22/2020), 400 mg (06/10/2020), 400 mg (06/25/2020)   for chemotherapy treatment.     07/08/2020 -  Chemotherapy   Patient is on Treatment Plan : COLORECTAL FOLFIRI / BEVACIZUMAB Q14D     11/05/2020 Cancer Staging   Staging form: Colon and Rectum, AJCC 8th Edition - Clinical: Stage IVC (pM1c) - Signed by Cammie Sickle, MD on 11/05/2020      HISTORY OF PRESENTING  ILLNESS: Alone ambulating independently.  Oscar [name] 81 y.o.  male with metastatic colon cancer to the liver-s/p multiple lines of therapy on stivarga [2 pills a day ]x 3 weeks [3 week on and 1 week off].  Denies any worsening constipation.  Abdominal pain is currently improved on oxycodone 5 mg up to 3 to 4 pills a day.  No nausea no vomiting.  Chronic mild fatigue not any worse.  No fever no chills.  No sores in the mouth.  No rash on palms and soles.  Review of Systems  Constitutional:  Positive for malaise/fatigue. Negative for chills, diaphoresis and fever.  HENT:  Negative for nosebleeds and sore throat.   Eyes:  Negative for double vision.  Respiratory:  Negative for cough, hemoptysis, sputum production, shortness of breath and wheezing.   Cardiovascular:  Negative for chest pain, palpitations, orthopnea and leg swelling.  Gastrointestinal:  Negative for blood in stool, constipation, heartburn, melena, nausea and vomiting.  Genitourinary:  Negative for dysuria, frequency and urgency.  Musculoskeletal:  Positive for back pain and joint pain.  Skin: Negative.  Negative for itching and rash.  Neurological:  Positive for tingling. Negative for focal weakness, weakness and headaches.  Endo/Heme/Allergies:  Does not bruise/bleed easily.  Psychiatric/Behavioral:  Negative for depression. The patient is not nervous/anxious and does not have insomnia.     MEDICAL HISTORY:  Past Medical History:  Diagnosis Date   Arthritis    Cancer ([place])    colon cancer 02/2019 per pt    Family history of adverse reaction to anesthesia    cousin took all night to wake up from anesthesia   H/O colon cancer, stage IV    Hyperlipemia  Neuromuscular disorder ([place])    Pre-diabetes     SURGICAL HISTORY: Past Surgical History:  Procedure Laterality Date   COLON SURGERY     ESOPHAGOGASTRODUODENOSCOPY (EGD) WITH PROPOFOL N/A 12/26/2020   Procedure: ESOPHAGOGASTRODUODENOSCOPY (EGD) WITH PROPOFOL;   Surgeon: [name], MD;  Location: [facility] ENDOSCOPY;  Service: Gastroenterology;  Laterality: N/A;   IR ANGIOGRAM SELECTIVE EACH ADDITIONAL VESSEL  12/19/2020   IR ANGIOGRAM SELECTIVE EACH ADDITIONAL VESSEL  03/24/2021   IR ANGIOGRAM SELECTIVE EACH ADDITIONAL VESSEL  03/24/2021   IR ANGIOGRAM SELECTIVE EACH ADDITIONAL VESSEL  04/04/2021   IR ANGIOGRAM SELECTIVE EACH ADDITIONAL VESSEL  04/04/2021   IR ANGIOGRAM SELECTIVE EACH ADDITIONAL VESSEL  04/04/2021   IR ANGIOGRAM VISCERAL SELECTIVE  12/19/2020   IR ANGIOGRAM VISCERAL SELECTIVE  03/24/2021   IR ANGIOGRAM VISCERAL SELECTIVE  04/04/2021   IR ANGIOGRAM VISCERAL SELECTIVE  04/04/2021   IR EMBO ARTERIAL NOT HEMORR HEMANG INC GUIDE ROADMAPPING  03/24/2021   IR EMBO TUMOR ORGAN ISCHEMIA INFARCT INC GUIDE ROADMAPPING  12/19/2020   IR EMBO TUMOR ORGAN ISCHEMIA INFARCT INC GUIDE ROADMAPPING  04/04/2021   IR IMAGING GUIDED PORT INSERTION  07/20/2019   IR RADIOLOGIST EVAL & MGMT  12/05/2020   IR RADIOLOGIST EVAL & MGMT  02/11/2021   IR RADIOLOGIST EVAL & MGMT  03/04/2021   IR RADIOLOGIST EVAL & MGMT  04/29/2021   IR RADIOLOGIST EVAL & MGMT  07/22/2021   IR RADIOLOGIST EVAL & MGMT  11/19/2021   IR US GUIDE VASC ACCESS RIGHT  12/19/2020   IR US GUIDE VASC ACCESS RIGHT  03/24/2021   IR US GUIDE VASC ACCESS RIGHT  04/04/2021   JOINT REPLACEMENT Left 2010   Knee   RADIOLOGY WITH ANESTHESIA N/A 01/15/2021   Procedure: CT WITH ANESTHESIA MICROWAVE ABLATION OF LIVER;  Surgeon: Criselda Peaches, MD;  Location: [facility] ORS;  Service: Anesthesiology;  Laterality: N/A;    SOCIAL HISTORY: Social History   Socioeconomic History   Marital status: Married    Spouse name: Not on file   Number of children: Not on file   Years of education: Not on file   Highest education level: Not on file  Occupational History   Not on file  Tobacco Use   Smoking status: Every Day    Packs/day: 0.25    Years: 15.00    Pack years: 3.75    Types: Cigarettes   Smokeless tobacco: Never   Tobacco  comments:    1 to 2 cigarettes a day occasionally  Vaping Use   Vaping Use: Never used  Substance and Sexual Activity   Alcohol use: Yes    Comment: rarely    Drug use: No   Sexual activity: Not on file  Other Topics Concern   Not on file  Social History Narrative   Recruitment consultant retd; lives in [place]; smoking 3cig/day; [3/4 ppd x started at 9 years]; no alcohol. Son & daughter; wife dementia [waiting for placement].    Social Determinants of Health   Financial Resource Strain: Not on file  Food Insecurity: Not on file  Transportation Needs: Not on file  Physical Activity: Not on file  Stress: Not on file  Social Connections: Not on file  Intimate Partner Violence: Not on file    FAMILY HISTORY: Family History  Problem Relation Age of Onset   Peptic Ulcer Disease Father     ALLERGIES:  is allergic to ace inhibitors.  MEDICATIONS:  Current Outpatient Medications  Medication Sig Dispense Refill  regorafenib (STIVARGA) 40 MG tablet Take 2 tablets (80 mg total) by mouth daily with breakfast. Take for 21 days, then hold for 7 days. Repeat every 28 days. 42 tablet 1   tamsulosin (FLOMAX) 0.4 MG CAPS capsule Take 1 capsule (0.4 mg total) by mouth daily. 30 capsule 11   amLODipine (NORVASC) 5 MG tablet Take 5 mg by mouth daily. (Patient not taking: Reported on 10/03/2021)     ondansetron (ZOFRAN) 4 MG tablet Take 1 tablet (4 mg total) by mouth every 8 (eight) hours as needed for nausea or vomiting. (Patient not taking: Reported on 02/24/2021) 20 tablet 0   ondansetron (ZOFRAN) 8 MG tablet Take 1 tablet (8 mg total) by mouth every 8 (eight) hours as needed for nausea or vomiting. (Patient not taking: Reported on 02/24/2021) 20 tablet 0   oxyCODONE (OXY IR/ROXICODONE) 5 MG immediate release tablet Take 1 tablet (5 mg total) by mouth every 6 (six) hours as needed for severe pain. 60 tablet 0   polyethylene glycol powder (GLYCOLAX/MIRALAX) 17 GM/SCOOP powder Take by mouth  as needed. (Patient not taking: Reported on 12/29/2021)     pravastatin (PRAVACHOL) 40 MG tablet Take 40 mg by mouth daily.  (Patient not taking: Reported on 06/09/2021)     predniSONE (DELTASONE) 20 MG tablet Take 1 tablet (20 mg total) by mouth daily with breakfast. (Patient not taking: Reported on 12/29/2021) 30 tablet 1   prochlorperazine (COMPAZINE) 10 MG tablet Take 1 tablet (10 mg total) by mouth every 6 (six) hours as needed for nausea or vomiting. (Patient not taking: Reported on 01/28/2021) 60 tablet 0   senna (SENOKOT) 8.6 MG TABS tablet Take 1 tablet (8.6 mg total) by mouth daily. (Patient not taking: Reported on 12/29/2021) 120 tablet 0   sucralfate (CARAFATE) 1 g tablet Take 1 tablet (1 g total) by mouth 4 (four) times daily -  with meals and at bedtime. (Patient not taking: Reported on 11/18/2021) 90 tablet 3   No current facility-administered medications for this visit.      Marland Kitchen  PHYSICAL EXAMINATION: ECOG PERFORMANCE STATUS: 0 - Asymptomatic  Vitals:   12/29/21 0932  BP: 106/63  Pulse: 85  Resp: 18  Temp: 98.9 F (37.2 C)  SpO2: 100%   Filed Weights   12/29/21 0932  Weight: 160 lb 3.2 oz (72.7 kg)    Physical Exam HENT:     Head: Normocephalic and atraumatic.     Mouth/Throat:     Pharynx: No oropharyngeal exudate.  Eyes:     Pupils: Pupils are equal, round, and reactive to light.  Cardiovascular:     Rate and Rhythm: Normal rate and regular rhythm.  Pulmonary:     Effort: No respiratory distress.     Breath sounds: No wheezing.     Comments: Decreased breath sounds bilaterally.  No wheeze or crackles Abdominal:     General: Bowel sounds are normal. There is no distension.     Palpations: Abdomen is soft. There is no mass.     Tenderness: There is no abdominal tenderness. There is no guarding or rebound.  Musculoskeletal:        General: No tenderness. Normal range of motion.     Cervical back: Normal range of motion and neck supple.  Skin:    General: Skin  is warm.  Neurological:     Mental Status: He is alert and oriented to person, place, and time.  Psychiatric:        Mood and  Affect: Affect normal.   LABORATORY DATA:  I have reviewed the data as listed Lab Results  Component Value Date   WBC 8.4 12/29/2021   HGB 11.8 (L) 12/29/2021   HCT 36.4 (L) 12/29/2021   MCV 104.9 (H) 12/29/2021   PLT 182 12/29/2021   Recent Labs    11/18/21 1420 12/08/21 0906 12/29/21 0916  NA 139 137 133*  K 3.5 4.1 3.8  CL 103 106 102  CO2 _0 GLUCOSE 117* 127* 232*  BUN _1 CREATININE 0.87 0.86 1.22  CALCIUM 8.6* 8.3* 8.0*  GFRNONAA >60 >60 >60  PROT 8.4* 8.9* 8.4*  ALBUMIN 2.7* 2.6* 2.4*  AST 36 41 45*  ALT _2 ALKPHOS 186* 191* 180*  BILITOT 0.3 0.6 1.5*    RADIOGRAPHIC STUDIES: I have personally reviewed the radiological images as listed and agreed with the findings in the report. No results found.   Latest Reference Range & Units Most Recent 06/09/21 08:30 06/25/21 10:24 07/07/21 09:06 07/21/21 08:32 08/04/21 08:54 08/18/21 08:31 09/01/21 08:47 09/25/21 08:29 10/28/21 10:14 11/18/21 14:20 12/08/21 09:06  CEA 0.0 - 4.7 ng/mL 301.0 (H) 12/08/21 09:06 91.6 (H) 84.1 (H) 87.6 (H) 89.1 (H) 108.0 (H) 108.0 (H) 107.0 (H) 105.0 (H) 119.0 (H) 177.0 (H) 301.0 (H)  (H): Data is abnormally high  ASSESSMENT & PLAN:   Cancer of right colon ([facility]) #Right-sided colon adenocarcinoma-with synchronous metastasis to liver/unresectabls;  CT scan dec 2022-progression of disease noted with increasing lung nodules/left moderate pleural effusion; new liver lesion; soft tissue implant anterior to the right kidney ~ 5cm in size. MRI JAN 2023- Numerous (at least 8) new or enlarging small liver metastases; Recurrent viable malignancy within previously treated large segment 8 and anterior left liver metastases;  Progressive metastatic retroperitoneal lymphadenopathy;  Progressive multifocal right peritoneal metastases; Progressive right adrenal and  upper right renal metastases. The upper right renal metastasis either increasingly encases or invades the right renal vein; Progressive pulmonary metastases at the lung bases;  Multifocal bulky left pleural metastases and moderate left pleural effusion, not substantially changed.  #Patient currently on Stivarga 2 pills a day-3 weeks on 1 week off.  Hold off any dose escalation given ongoing fatigue/poor tolerance in general.  dsicussed that CEA has beein rising; will plan imaging in 1-2 months.   # Right upper quadrant pain-secondary to malignancy-s/p evaluation with [name]; continue oxycodone 5 mg up to 3-4 pills a day as needed.  STABLE.  #Weight loss: wants to hold off low-dose prednisone 20 mg a day;  As eating "good"- weight stable.  # Mediport- functioning-STABLE>  # DISPOSITION:   # follow up in 4 weeks-  MD; labs- cbc/cmp CEA--Dr.B       All questions were answered. The patient knows to call the clinic with any problems, questions or concerns.    Cammie Sickle, MD 12/29/2021 9:54 AM

## 2021-12-30 LAB — CEA: CEA: 291 ng/mL — ABNORMAL HIGH (ref 0.0–4.7)

## 2022-01-20 ENCOUNTER — Other Ambulatory Visit: Payer: Self-pay | Admitting: *Deleted

## 2022-01-20 MED ORDER — TAMSULOSIN HCL 0.4 MG PO CAPS: 0.4000 mg | ORAL_CAPSULE | Freq: Every day | ORAL | 1 refills | Status: DC

## 2022-01-21 ENCOUNTER — Inpatient Hospital Stay: Payer: Medicare HMO | Attending: Internal Medicine | Admitting: Hospice and Palliative Medicine

## 2022-01-21 DIAGNOSIS — C182 Malignant neoplasm of ascending colon: Secondary | ICD-10-CM | POA: Insufficient documentation

## 2022-01-21 DIAGNOSIS — Z5111 Encounter for antineoplastic chemotherapy: Secondary | ICD-10-CM | POA: Insufficient documentation

## 2022-01-21 DIAGNOSIS — Z5112 Encounter for antineoplastic immunotherapy: Secondary | ICD-10-CM | POA: Insufficient documentation

## 2022-01-21 DIAGNOSIS — C7971 Secondary malignant neoplasm of right adrenal gland: Secondary | ICD-10-CM | POA: Insufficient documentation

## 2022-01-21 DIAGNOSIS — G893 Neoplasm related pain (acute) (chronic): Secondary | ICD-10-CM | POA: Diagnosis not present

## 2022-01-21 DIAGNOSIS — C782 Secondary malignant neoplasm of pleura: Secondary | ICD-10-CM | POA: Insufficient documentation

## 2022-01-21 DIAGNOSIS — Z452 Encounter for adjustment and management of vascular access device: Secondary | ICD-10-CM | POA: Insufficient documentation

## 2022-01-21 DIAGNOSIS — R5383 Other fatigue: Secondary | ICD-10-CM | POA: Insufficient documentation

## 2022-01-21 DIAGNOSIS — Z79899 Other long term (current) drug therapy: Secondary | ICD-10-CM | POA: Insufficient documentation

## 2022-01-21 DIAGNOSIS — F17210 Nicotine dependence, cigarettes, uncomplicated: Secondary | ICD-10-CM | POA: Insufficient documentation

## 2022-01-21 DIAGNOSIS — Z515 Encounter for palliative care: Secondary | ICD-10-CM | POA: Diagnosis not present

## 2022-01-21 DIAGNOSIS — C786 Secondary malignant neoplasm of retroperitoneum and peritoneum: Secondary | ICD-10-CM | POA: Insufficient documentation

## 2022-01-21 DIAGNOSIS — C7800 Secondary malignant neoplasm of unspecified lung: Secondary | ICD-10-CM | POA: Insufficient documentation

## 2022-01-21 DIAGNOSIS — J90 Pleural effusion, not elsewhere classified: Secondary | ICD-10-CM | POA: Insufficient documentation

## 2022-01-21 DIAGNOSIS — C787 Secondary malignant neoplasm of liver and intrahepatic bile duct: Secondary | ICD-10-CM | POA: Insufficient documentation

## 2022-01-21 MED ORDER — OXYCODONE HCL 5 MG PO TABS: 5.0000 mg | ORAL_TABLET | Freq: Four times a day (QID) | ORAL | 0 refills | Status: DC | PRN

## 2022-01-21 NOTE — Progress Notes (Signed)
Virtual Visit via Telephone Note ? ?I connected with Oscar [name] on 01/21/22 at  1:00 PM EDT by telephone and verified that I am speaking with the correct person using two identifiers. ? ?Location: ?Patient: Home ?Provider: Clinic ?  ?I discussed the limitations, risks, security and privacy concerns of performing an evaluation and management service by telephone and the availability of in person appointments. I also discussed with the patient that there may be a patient responsible charge related to this service. The patient expressed understanding and agreed to proceed. ? ? ?History of Present Illness: ?Oscar [name] is a 81 y.o. male with multiple medical problems including stage IV colon cancer.  Patient was referred to palliative care to help address goals and manage ongoing symptoms. ?  ?Observations/Objective: ?I called and spoke with patient by phone.  Patient reports he is doing reasonably well without significant changes or concerns.  He is on current treatment with Stivarga and seems to be tolerating okay.  Patient endorses chronic but stable pain, well managed on oxycodone.  Patient does request refill of oxycodone.   ? ?Assessment and Plan: ?Stage IV colon cancer -on treatment with Stivarga.  Patient has follow-up with Dr. Rogue Bussing next week ? ?Neoplasm related pain -refill oxycodone.  PDMP reviewed. ? ?Follow Up Instructions: ?Follow-up telephone visit 2 months ?  ?I discussed the assessment and treatment plan with the patient. The patient was provided an opportunity to ask questions and all were answered. The patient agreed with the plan and demonstrated an understanding of the instructions. ?  ?The patient was advised to call back or seek an in-person evaluation if the symptoms worsen or if the condition fails to improve as anticipated. ? ?I provided 5 minutes of non-face-to-face time during this encounter. ? ? ?[name], NP ? ? ?

## 2022-01-26 ENCOUNTER — Inpatient Hospital Stay: Payer: Medicare HMO

## 2022-01-26 ENCOUNTER — Encounter: Payer: Self-pay | Admitting: Internal Medicine

## 2022-01-26 ENCOUNTER — Inpatient Hospital Stay: Payer: Medicare HMO | Admitting: Internal Medicine

## 2022-01-26 DIAGNOSIS — C182 Malignant neoplasm of ascending colon: Secondary | ICD-10-CM | POA: Diagnosis not present

## 2022-01-26 DIAGNOSIS — C786 Secondary malignant neoplasm of retroperitoneum and peritoneum: Secondary | ICD-10-CM | POA: Diagnosis not present

## 2022-01-26 DIAGNOSIS — C7800 Secondary malignant neoplasm of unspecified lung: Secondary | ICD-10-CM | POA: Diagnosis not present

## 2022-01-26 DIAGNOSIS — C782 Secondary malignant neoplasm of pleura: Secondary | ICD-10-CM | POA: Diagnosis not present

## 2022-01-26 DIAGNOSIS — Z5112 Encounter for antineoplastic immunotherapy: Secondary | ICD-10-CM | POA: Diagnosis not present

## 2022-01-26 DIAGNOSIS — C7971 Secondary malignant neoplasm of right adrenal gland: Secondary | ICD-10-CM | POA: Diagnosis not present

## 2022-01-26 DIAGNOSIS — R5383 Other fatigue: Secondary | ICD-10-CM | POA: Diagnosis not present

## 2022-01-26 DIAGNOSIS — Z79899 Other long term (current) drug therapy: Secondary | ICD-10-CM | POA: Diagnosis not present

## 2022-01-26 DIAGNOSIS — Z452 Encounter for adjustment and management of vascular access device: Secondary | ICD-10-CM | POA: Diagnosis not present

## 2022-01-26 DIAGNOSIS — C787 Secondary malignant neoplasm of liver and intrahepatic bile duct: Secondary | ICD-10-CM | POA: Diagnosis not present

## 2022-01-26 DIAGNOSIS — G893 Neoplasm related pain (acute) (chronic): Secondary | ICD-10-CM | POA: Diagnosis not present

## 2022-01-26 DIAGNOSIS — Z5111 Encounter for antineoplastic chemotherapy: Secondary | ICD-10-CM | POA: Diagnosis not present

## 2022-01-26 DIAGNOSIS — J90 Pleural effusion, not elsewhere classified: Secondary | ICD-10-CM | POA: Diagnosis not present

## 2022-01-26 DIAGNOSIS — F17210 Nicotine dependence, cigarettes, uncomplicated: Secondary | ICD-10-CM | POA: Diagnosis not present

## 2022-01-26 LAB — COMPREHENSIVE METABOLIC PANEL
ALT: 17 U/L (ref 0–44)
AST: 41 U/L (ref 15–41)
Albumin: 2.3 g/dL — ABNORMAL LOW (ref 3.5–5.0)
Alkaline Phosphatase: 181 U/L — ABNORMAL HIGH (ref 38–126)
Anion gap: 5 (ref 5–15)
BUN: 11 mg/dL (ref 8–23)
CO2: 27 mmol/L (ref 22–32)
Calcium: 8 mg/dL — ABNORMAL LOW (ref 8.9–10.3)
Chloride: 103 mmol/L (ref 98–111)
Creatinine, Ser: 0.89 mg/dL (ref 0.61–1.24)
GFR, Estimated: >60 mL/min (ref >60)
Glucose, Bld: 154 mg/dL — ABNORMAL HIGH (ref 70–99)
Potassium: 3.5 mmol/L (ref 3.5–5.1)
Sodium: 135 mmol/L (ref 135–145)
Total Bilirubin: 1.1 mg/dL (ref 0.3–1.2)
Total Protein: 8.4 g/dL — ABNORMAL HIGH (ref 6.5–8.1)

## 2022-01-26 LAB — CBC WITH DIFFERENTIAL/PLATELET
Abs Immature Granulocytes: 0.04 10*3/uL (ref 0.00–0.07)
Basophils Absolute: 0.1 10*3/uL (ref 0.0–0.1)
Basophils Relative: 1 %
Eosinophils Absolute: 0.4 10*3/uL (ref 0.0–0.5)
Eosinophils Relative: 4 %
HCT: 37.3 % — ABNORMAL LOW (ref 39.0–52.0)
Hemoglobin: 11.9 g/dL — ABNORMAL LOW (ref 13.0–17.0)
Immature Granulocytes: 0 %
Lymphocytes Relative: 10 %
Lymphs Abs: 1 10*3/uL (ref 0.7–4.0)
MCH: 31.7 pg (ref 26.0–34.0)
MCHC: 31.9 g/dL (ref 30.0–36.0)
MCV: 99.5 fL (ref 80.0–100.0)
Monocytes Absolute: 0.9 10*3/uL (ref 0.1–1.0)
Monocytes Relative: 9 %
Neutro Abs: 7.4 10*3/uL (ref 1.7–7.7)
Neutrophils Relative %: 76 %
Platelets: 181 10*3/uL (ref 150–400)
RBC: 3.75 MIL/uL — ABNORMAL LOW (ref 4.22–5.81)
RDW: 14.8 % (ref 11.5–15.5)
WBC: 9.8 10*3/uL (ref 4.0–10.5)
nRBC: 0 % (ref 0.0–0.2)

## 2022-01-26 MED ORDER — REGORAFENIB 40 MG PO TABS: 80.0000 mg | ORAL_TABLET | Freq: Every day | ORAL | 1 refills | Status: DC

## 2022-01-26 NOTE — Assessment & Plan Note (Addendum)
#  Right-sided colon adenocarcinoma-with synchronous metastasis to liver/unresectabls;  CT scan dec 2022-progression of disease noted with increasing lung nodules/left moderate pleural effusion; new liver lesion; soft tissue implant anterior to the right kidney ~ 5cm in size. MRI JAN 2023- Numerous (at least 8) new or enlarging small liver metastases; Recurrent viable malignancy within previously treated large segment 8 and anterior left liver metastases;  Progressive metastatic retroperitoneal lymphadenopathy;  Progressive multifocal right peritoneal metastases; Progressive right adrenal and upper right renal metastases. The upper right renal metastasis either increasingly encases or invades the right renal vein; Progressive pulmonary metastases at the lung bases;  Multifocal bulky left pleural metastases and moderate left pleural effusion, not substantially changed. ? ?#Patient currently on Stivarga 2 pills a day-3 weeks on 1 week off.  Hold off any dose escalation given ongoing fatigue/poor tolerance in general.  dsicussed that CEA has beein rising; will plan imaging in 1 month; will order today.  If progressive disease noted- ? Candidate for Kras inhibitor [pt has Kras G12V; but sotorasib for K ras G12C]; await repeat scans.   ? ?# Right upper quadrant pain-secondary to malignancy-s/p evaluation with [name]; continue oxycodone 5 mg up to 3-4 pills a day as needed.  STABLE. ? ?#Weight loss: wants to hold off low-dose prednisone 20 mg a day; recommend starting prednisone.  ? ?# Mediport- functioning-STABLE> ? ?# DISPOSITION: ? ?# follow up in 4 weeks-  MD; labs- cbc/cmp CEA; CT CAP prior---Dr.B ? ?Addendum: Spoke to patient'son- that dostarlimab is approved only for MSI high patient's.  Patient's tumor is MSI stable/enhancing not a candidate for immunotherapy. Discussed with the son my concerns for progressive disease while on current therapy/regorafenib; awaiting repeat CT scans. As per the son'  wishes-we will make a referral to [facility] for possibility of clinical trials.  ? ?Please send referral to Dr. Marcelino Freestone seen]/or any other earliest available doctor;[facility] re: colon cancer.  ?

## 2022-01-26 NOTE — Progress Notes (Signed)
Patient has a decrease in appetite and change in taste with 5 lb wt loss since 12/29/21, has not tried drinking a meal replacement shake.  Afraid to eat to much for fear of abdominal pain would get worse.   ? ?Abdominal pain 2/10 today and has not taken a pain med yet.   ? ?Constipation with last good bowel movement 2 days ago.    ?

## 2022-01-26 NOTE — Progress Notes (Signed)
[name] ?CONSULT NOTE ? ?Patient Care Team: ?Sofie Hartigan, MD as PCP - General (Family Medicine) ?Cammie Sickle, MD as Consulting Physician (Hematology and Oncology) ? ?CHIEF COMPLAINTS/PURPOSE OF CONSULTATION: Colon cancer ? ?#  ?Oncology History Overview Note  ?# MAY 2020- 3. 03/09/19 Liver biopsy. Microscopic examination shows malignant cells with glandular architecture consistent with adenocarcinoma. The malignant cells are positive for CK20 and CDX-2. These findings support the clinical impression of metastatic colon adenocarcinoma. ?4. 03/10/19 R hemicolectomy. Tumor site cecum. Adenocarcinoma. Mucinous features present. G2. No tumor deposits. Invades visceral peritoneum. No tumor perforation. LVI present. PNI not identified. All margins uninvolved. 1/12 LNs. PT4apN1. Periappendiceal inflammation c/w resolving abscess. Microsatellite stable (MSS). [Dr.Mettu; [facility]] ? ?# SEP 4th 2020 [compared to May 2020]  Interval increase in size of the metastases to the hepatic dome, The metastasis to the left hepatic lobe is unchanged; 2.  New subcentimeter hypoattenuating lesion in the inferior right hepatic lobe, incompletely characterized on CT. 3.  Postsurgical changes following right hemicolectomy. ? ?# OCT 2020- FOLFOX +avastin; CT dec 22nd 2020- [compared to [facility] sep 9th 2020]-Liver- slight progression versus stable disease; CT scan SEP 4th 2021- Progressive disease-left lower lobe lung nodule 8 mm [previously 4 mm]; increase in size of the hepatic metastases by few millimeters.;  Soft tissue nodule adjacent to anastomotic site again increased by few millimeters.STOP FOLFOX; cont avastin ? ?#  SEP 20th, 2021- FOLFIRI+ AVASTIN; HOLD FEB 2022- sec to poor tolerance [peptic ulcer]; 3/30-liver ablation-   [bland embolization of segment 2/3 liver lesion on 12/19/2020;  microwave ablation of both liver lesions. ? ?# ?July 25th, 2022- BEV _ TAS 102.MAY 2022- s/p Liver ablation ? ?# DEC  2022-progressive disease-lung and liver/abdomen; JAN 31st 2023-START Regorefenib.  ? ?# FEB /16-EGD [Dr.Anna-duodenal ulcer-? meloxicam] ? ?# JAN 2022- COVID [s/p Mab infusion; pills; skin rash resolved.] ? ?# NGS/F-ONE-MUTATED K-RAS [G] ? ? ? ? ?  ?Cancer of right colon ([place])  ?07/05/2019 Initial Diagnosis  ? Cancer of right colon [facility]) ?  ?07/24/2019 - 06/25/2020 Chemotherapy  ? The patient had dexamethasone (DECADRON) 4 MG tablet, 8 mg, Oral, Daily, 1 of 1 cycle, Start date: --, End date: -- ?palonosetron (ALOXI) injection 0.25 mg, 0.25 mg, Intravenous,  Once, 23 of 25 cycles ?Administration: 0.25 mg (07/24/2019), 0.25 mg (08/07/2019), 0.25 mg (08/21/2019), 0.25 mg (09/04/2019), 0.25 mg (09/20/2019), 0.25 mg (10/04/2019), 0.25 mg (10/16/2019), 0.25 mg (10/30/2019), 0.25 mg (11/22/2019), 0.25 mg (12/06/2019), 0.25 mg (12/20/2019), 0.25 mg (01/03/2020), 0.25 mg (01/29/2020), 0.25 mg (02/12/2020), 0.25 mg (02/26/2020), 0.25 mg (03/11/2020), 0.25 mg (03/25/2020), 0.25 mg (04/08/2020), 0.25 mg (04/24/2020), 0.25 mg (05/08/2020), 0.25 mg (05/22/2020), 0.25 mg (06/10/2020), 0.25 mg (06/25/2020) ?leucovorin 800 mg in dextrose 5 % 250 mL infusion, 844 mg, Intravenous,  Once, 23 of 25 cycles ?Administration: 800 mg (07/24/2019), 800 mg (08/07/2019), 800 mg (08/21/2019), 800 mg (09/04/2019), 800 mg (09/20/2019), 800 mg (10/04/2019), 800 mg (10/16/2019), 800 mg (10/30/2019), 800 mg (11/22/2019), 800 mg (12/06/2019), 800 mg (12/20/2019), 800 mg (01/03/2020), 800 mg (01/29/2020), 800 mg (02/12/2020), 800 mg (02/26/2020), 800 mg (03/11/2020), 800 mg (03/25/2020), 800 mg (04/08/2020), 800 mg (04/24/2020), 800 mg (05/08/2020), 800 mg (05/22/2020), 800 mg (06/10/2020), 800 mg (06/25/2020) ?oxaliplatin (ELOXATIN) 180 mg in dextrose 5 % 500 mL chemo infusion, 85 mg/m2 = 180 mg, Intravenous,  Once, 23 of 25 cycles ?Dose modification: 178 mg (original dose 85 mg/m2, Cycle 17, Reason: Other (see comments), Comment: insurance adjusted dose ) ?Administration: 180 mg (07/24/2019), 180  mg  (08/07/2019), 180 mg (08/21/2019), 180 mg (09/04/2019), 180 mg (09/20/2019), 180 mg (10/04/2019), 180 mg (10/16/2019), 180 mg (10/30/2019), 180 mg (11/22/2019), 180 mg (12/06/2019), 180 mg (12/20/2019), 180 mg (01/03/2020), 180 mg (01/29/2020), 180 mg (02/12/2020), 180 mg (02/26/2020), 180 mg (03/11/2020), 180 mg (04/08/2020), 180 mg (04/24/2020), 180 mg (05/08/2020), 180 mg (05/22/2020), 180 mg (06/10/2020), 180 mg (06/25/2020) ?fluorouracil (ADRUCIL) 5,000 mg in sodium chloride 0.9 % 150 mL chemo infusion, 5,050 mg, Intravenous, 1 Day/Dose, 23 of 25 cycles ?Administration: 5,000 mg (07/24/2019), 5,000 mg (08/07/2019), 5,000 mg (08/21/2019), 5,050 mg (09/04/2019), 5,000 mg (10/04/2019), 5,000 mg (10/16/2019), 5,000 mg (11/22/2019), 5,000 mg (12/06/2019), 5,000 mg (12/20/2019), 5,000 mg (01/03/2020), 5,000 mg (01/29/2020), 5,000 mg (02/12/2020), 5,000 mg (02/26/2020), 5,000 mg (03/11/2020), 5,000 mg (03/25/2020), 5,000 mg (04/08/2020), 5,000 mg (04/24/2020), 5,000 mg (05/08/2020), 5,000 mg (05/22/2020), 5,000 mg (06/10/2020), 5,000 mg (06/25/2020) ?bevacizumab-bvzr (ZIRABEV) 400 mg in sodium chloride 0.9 % 100 mL chemo infusion, 5 mg/kg = 400 mg, Intravenous,  Once, 23 of 25 cycles ?Administration: 400 mg (08/07/2019), 400 mg (08/21/2019), 400 mg (09/04/2019), 400 mg (09/20/2019), 400 mg (10/04/2019), 400 mg (10/16/2019), 400 mg (10/30/2019), 400 mg (11/22/2019), 400 mg (12/06/2019), 400 mg (12/20/2019), 400 mg (01/03/2020), 400 mg (01/29/2020), 400 mg (02/12/2020), 400 mg (02/26/2020), 400 mg (03/11/2020), 400 mg (03/25/2020), 400 mg (04/08/2020), 400 mg (04/24/2020), 400 mg (05/08/2020), 400 mg (05/22/2020), 400 mg (06/10/2020), 400 mg (06/25/2020) ? ? for chemotherapy treatment.  ? ?  ?07/08/2020 -  Chemotherapy  ? Patient is on Treatment Plan : COLORECTAL FOLFIRI / BEVACIZUMAB Q14D  ?   ?11/05/2020 Cancer Staging  ? Staging form: Colon and Rectum, AJCC 8th Edition ?- Clinical: Stage IVC (pM1c) - Signed by Cammie Sickle, MD on 11/05/2020 ? ?  ? ?HISTORY OF PRESENTING ILLNESS:  Alone ambulating independently. ? ?Oscar [name] 81 y.o.  male with metastatic colon cancer to the liver-s/p multiple lines of therapy on stivarga [2 pills a day ]x 3 weeks [3 week on and 1 week off]. ? ?Continues to complain of fatigue.  Denies any nausea or vomiting.  Continues to have abdominal pain for which he has been taking oxycodone 5 mg up to 3 to 4 pills a day. No fever no chills.  No sores in the mouth.  No rash on palms and soles. ? ?Review of Systems  ?Constitutional:  Positive for malaise/fatigue. Negative for chills, diaphoresis and fever.  ?HENT:  Negative for nosebleeds and sore throat.   ?Eyes:  Negative for double vision.  ?Respiratory:  Negative for cough, hemoptysis, sputum production, shortness of breath and wheezing.   ?Cardiovascular:  Negative for chest pain, palpitations, orthopnea and leg swelling.  ?Gastrointestinal:  Negative for blood in stool, constipation, heartburn, melena, nausea and vomiting.  ?Genitourinary:  Negative for dysuria, frequency and urgency.  ?Musculoskeletal:  Positive for back pain and joint pain.  ?Skin: Negative.  Negative for itching and rash.  ?Neurological:  Positive for tingling. Negative for focal weakness, weakness and headaches.  ?Endo/Heme/Allergies:  Does not bruise/bleed easily.  ?Psychiatric/Behavioral:  Negative for depression. The patient is not nervous/anxious and does not have insomnia.    ? ?MEDICAL HISTORY:  ?Past Medical History:  ?Diagnosis Date  ?? Arthritis   ?? Cancer [facility])   ? colon cancer 02/2019 per pt   ?? Family history of adverse reaction to anesthesia   ? cousin took all night to wake up from anesthesia  ?? H/O colon cancer, stage IV   ?? Hyperlipemia   ??  Neuromuscular disorder ([place])   ?? Pre-diabetes   ? ? ?SURGICAL HISTORY: ?Past Surgical History:  ?Procedure Laterality Date  ?? COLON SURGERY    ?? ESOPHAGOGASTRODUODENOSCOPY (EGD) WITH PROPOFOL N/A 12/26/2020  ? Procedure: ESOPHAGOGASTRODUODENOSCOPY (EGD) WITH PROPOFOL;  Surgeon:  [name], MD;  Location: [facility] ENDOSCOPY;  Service: Gastroenterology;  Laterality: N/A;  ?? IR ANGIOGRAM SELECTIVE EACH ADDITIONAL VESSEL  12/19/2020  ?? IR ANGIOGRAM SELECTIVE EACH ADDITIONAL VESSEL  03/24/2021  ??

## 2022-01-27 ENCOUNTER — Telehealth: Payer: Self-pay | Admitting: Internal Medicine

## 2022-01-27 LAB — CEA: CEA: 649 ng/mL — ABNORMAL HIGH (ref 0.0–4.7)

## 2022-01-27 NOTE — Telephone Encounter (Signed)
Spoke to patient'son- that dostarlimab is approved only for MSI high patient's.  Patient's tumor is MSI stable/enhancing not a candidate for immunotherapy. ? ?Discussed with the son my concerns for progressive disease while on current therapy/regorafenib; awaiting repeat CT scans. ? ?As per the son' wishes-we will make a referral to [facility] for possibility of clinical trials.  ? ?Please send referral to Dr. Mettu/or any other earliest available doctor;[facility] re: colon cancer.  ? ?GB ?

## 2022-01-28 ENCOUNTER — Other Ambulatory Visit: Payer: Self-pay

## 2022-01-28 ENCOUNTER — Other Ambulatory Visit: Payer: Self-pay | Admitting: Internal Medicine

## 2022-01-28 DIAGNOSIS — C182 Malignant neoplasm of ascending colon: Secondary | ICD-10-CM

## 2022-01-28 NOTE — Progress Notes (Signed)
I spoke to pt' son, Lennette Bihari re: using FOLFIRI + avastin; last used in JAN 2022. ? ?Lennette Bihari will speak to Patient regarding the plan.  Also await evaluation at [facility].  ?

## 2022-01-28 NOTE — Telephone Encounter (Signed)
Referral/demo/ins/notes/labs faxed to [facility]  ?Phone [phone] ?Fax [phone] ?

## 2022-01-29 ENCOUNTER — Telehealth: Payer: Self-pay | Admitting: Internal Medicine

## 2022-01-29 NOTE — Telephone Encounter (Signed)
On 4/12-I spoke with patient and daughter regarding the rising CEA progression of disease.  Recommend starting the patient on systemic chemotherapy with FOLFIRI + avastin next week.  Patient will not need to go on Stivarga. ? ?Ebony Hail please cancel the prescription. ? ?Please cancel the patient appointment in 1 month.  Please re-schedule if the scans sooner [next 1-2 weeks]  ? ?Please schedule early next week/okay to double book- MD; labs-CBC CMP; UA; FOLFIRI+avastin; day 3-pump off. ? ?Thanks ?GB ? ? ? ? ?

## 2022-01-29 NOTE — Telephone Encounter (Signed)
Confirmed with MD that he spoke to [facility] and will keep patient as scheduled for 4/21; pump off on 4/24.  ?

## 2022-01-30 NOTE — Telephone Encounter (Signed)
Called [employer] to inactivate patient's Stivarga patient assistance ?

## 2022-02-02 ENCOUNTER — Other Ambulatory Visit: Payer: Self-pay | Admitting: Internal Medicine

## 2022-02-05 NOTE — Telephone Encounter (Signed)
I called [facility], spoke with Corene Cornea, they tried calling an leaving messages for the pt 3 times with no return call. I gave Corene Cornea the pt's home # as well as pts contact person info, daughter carolann. Corene Cornea states he will try to reach pt at these #'s, if no response, he will call us to let us know. ?

## 2022-02-06 ENCOUNTER — Inpatient Hospital Stay: Payer: Medicare HMO

## 2022-02-06 ENCOUNTER — Encounter: Payer: Self-pay | Admitting: Internal Medicine

## 2022-02-06 ENCOUNTER — Inpatient Hospital Stay: Payer: Medicare HMO | Admitting: Internal Medicine

## 2022-02-06 DIAGNOSIS — C182 Malignant neoplasm of ascending colon: Secondary | ICD-10-CM

## 2022-02-06 DIAGNOSIS — Z7189 Other specified counseling: Secondary | ICD-10-CM

## 2022-02-06 DIAGNOSIS — Z5111 Encounter for antineoplastic chemotherapy: Secondary | ICD-10-CM | POA: Diagnosis not present

## 2022-02-06 DIAGNOSIS — C7971 Secondary malignant neoplasm of right adrenal gland: Secondary | ICD-10-CM | POA: Diagnosis not present

## 2022-02-06 DIAGNOSIS — C787 Secondary malignant neoplasm of liver and intrahepatic bile duct: Secondary | ICD-10-CM | POA: Diagnosis not present

## 2022-02-06 DIAGNOSIS — C7800 Secondary malignant neoplasm of unspecified lung: Secondary | ICD-10-CM | POA: Diagnosis not present

## 2022-02-06 DIAGNOSIS — Z452 Encounter for adjustment and management of vascular access device: Secondary | ICD-10-CM | POA: Diagnosis not present

## 2022-02-06 DIAGNOSIS — Z5112 Encounter for antineoplastic immunotherapy: Secondary | ICD-10-CM | POA: Diagnosis not present

## 2022-02-06 DIAGNOSIS — C782 Secondary malignant neoplasm of pleura: Secondary | ICD-10-CM | POA: Diagnosis not present

## 2022-02-06 DIAGNOSIS — C786 Secondary malignant neoplasm of retroperitoneum and peritoneum: Secondary | ICD-10-CM | POA: Diagnosis not present

## 2022-02-06 LAB — COMPREHENSIVE METABOLIC PANEL
ALT: 16 U/L (ref 0–44)
AST: 39 U/L (ref 15–41)
Albumin: 2.3 g/dL — ABNORMAL LOW (ref 3.5–5.0)
Alkaline Phosphatase: 195 U/L — ABNORMAL HIGH (ref 38–126)
Anion gap: 8 (ref 5–15)
BUN: 9 mg/dL (ref 8–23)
CO2: 26 mmol/L (ref 22–32)
Calcium: 8.2 mg/dL — ABNORMAL LOW (ref 8.9–10.3)
Chloride: 102 mmol/L (ref 98–111)
Creatinine, Ser: 1.05 mg/dL (ref 0.61–1.24)
GFR, Estimated: >60 mL/min (ref >60)
Glucose, Bld: 186 mg/dL — ABNORMAL HIGH (ref 70–99)
Potassium: 3.6 mmol/L (ref 3.5–5.1)
Sodium: 136 mmol/L (ref 135–145)
Total Bilirubin: 1 mg/dL (ref 0.3–1.2)
Total Protein: 8.6 g/dL — ABNORMAL HIGH (ref 6.5–8.1)

## 2022-02-06 LAB — URINALYSIS, COMPLETE (UACMP) WITH MICROSCOPIC
Bacteria, UA: NONE SEEN
Bilirubin Urine: NEGATIVE
Glucose, UA: NEGATIVE mg/dL
Ketones, ur: NEGATIVE mg/dL
Leukocytes,Ua: NEGATIVE
Nitrite: NEGATIVE
Protein, ur: 100 mg/dL — AB
Specific Gravity, Urine: 1.019 (ref 1.005–1.030)
pH: 5 (ref 5.0–8.0)

## 2022-02-06 LAB — CBC WITH DIFFERENTIAL/PLATELET
Abs Immature Granulocytes: 0.02 10*3/uL (ref 0.00–0.07)
Basophils Absolute: 0.1 10*3/uL (ref 0.0–0.1)
Basophils Relative: 1 %
Eosinophils Absolute: 0.5 10*3/uL (ref 0.0–0.5)
Eosinophils Relative: 7 %
HCT: 36.5 % — ABNORMAL LOW (ref 39.0–52.0)
Hemoglobin: 11.8 g/dL — ABNORMAL LOW (ref 13.0–17.0)
Immature Granulocytes: 0 %
Lymphocytes Relative: 13 %
Lymphs Abs: 0.9 10*3/uL (ref 0.7–4.0)
MCH: 32.3 pg (ref 26.0–34.0)
MCHC: 32.3 g/dL (ref 30.0–36.0)
MCV: 100 fL (ref 80.0–100.0)
Monocytes Absolute: 0.5 10*3/uL (ref 0.1–1.0)
Monocytes Relative: 8 %
Neutro Abs: 4.9 10*3/uL (ref 1.7–7.7)
Neutrophils Relative %: 71 %
Platelets: 189 10*3/uL (ref 150–400)
RBC: 3.65 MIL/uL — ABNORMAL LOW (ref 4.22–5.81)
RDW: 15.3 % (ref 11.5–15.5)
WBC: 6.8 10*3/uL (ref 4.0–10.5)
nRBC: 0 % (ref 0.0–0.2)

## 2022-02-06 MED ORDER — DIPHENOXYLATE-ATROPINE 2.5-0.025 MG PO TABS: 1.0000 | ORAL_TABLET | Freq: Four times a day (QID) | ORAL | 0 refills | Status: DC | PRN

## 2022-02-06 MED ORDER — SODIUM CHLORIDE 0.9 % IV SOLN
5.0000 mg/kg | Freq: Once | INTRAVENOUS | Status: AC
  Administered 2022-02-06: 400 mg via INTRAVENOUS
  Filled 2022-02-06: qty 16

## 2022-02-06 MED ORDER — SODIUM CHLORIDE 0.9 % IV SOLN
340.0000 mg | Freq: Once | INTRAVENOUS | Status: AC
  Administered 2022-02-06: 340 mg via INTRAVENOUS
  Filled 2022-02-06: qty 15

## 2022-02-06 MED ORDER — SODIUM CHLORIDE 0.9 % IV SOLN
4800.0000 mg | INTRAVENOUS | Status: DC
  Administered 2022-02-06: 4800 mg via INTRAVENOUS
  Filled 2022-02-06: qty 96

## 2022-02-06 MED ORDER — PALONOSETRON HCL INJECTION 0.25 MG/5ML
0.2500 mg | Freq: Once | INTRAVENOUS | Status: AC
  Administered 2022-02-06: 0.25 mg via INTRAVENOUS
  Filled 2022-02-06: qty 5

## 2022-02-06 MED ORDER — DIPHENOXYLATE-ATROPINE 2.5-0.025 MG PO TABS
1.0000 | ORAL_TABLET | Freq: Once | ORAL | Status: AC
  Administered 2022-02-06: 1 via ORAL
  Filled 2022-02-06: qty 1

## 2022-02-06 MED ORDER — SODIUM CHLORIDE 0.9 % IV SOLN
Freq: Once | INTRAVENOUS | Status: AC
  Filled 2022-02-06: qty 250

## 2022-02-06 MED ORDER — SODIUM CHLORIDE 0.9 % IV SOLN
10.0000 mg | Freq: Once | INTRAVENOUS | Status: AC
  Administered 2022-02-06: 10 mg via INTRAVENOUS
  Filled 2022-02-06: qty 10

## 2022-02-06 NOTE — Progress Notes (Signed)
UProtein 100. Per MD ok to proceed. ?

## 2022-02-06 NOTE — Progress Notes (Signed)
[name] ?CONSULT NOTE ? ?Patient Care Team: ?Sofie Hartigan, MD as PCP - General (Family Medicine) ?Cammie Sickle, MD as Consulting Physician (Hematology and Oncology) ? ?CHIEF COMPLAINTS/PURPOSE OF CONSULTATION: Colon cancer ? ?#  ?Oncology History Overview Note  ?# MAY 2020- 3. 03/09/19 Liver biopsy. Microscopic examination shows malignant cells with glandular architecture consistent with adenocarcinoma. The malignant cells are positive for CK20 and CDX-2. These findings support the clinical impression of metastatic colon adenocarcinoma. ?4. 03/10/19 R hemicolectomy. Tumor site cecum. Adenocarcinoma. Mucinous features present. G2. No tumor deposits. Invades visceral peritoneum. No tumor perforation. LVI present. PNI not identified. All margins uninvolved. 1/12 LNs. PT4apN1. Periappendiceal inflammation c/w resolving abscess. Microsatellite stable (MSS). [Dr.Mettu; [facility]] ? ?# SEP 4th 2020 [compared to May 2020]  Interval increase in size of the metastases to the hepatic dome, The metastasis to the left hepatic lobe is unchanged; 2.  New subcentimeter hypoattenuating lesion in the inferior right hepatic lobe, incompletely characterized on CT. 3.  Postsurgical changes following right hemicolectomy. ? ?# OCT 2020- FOLFOX +avastin; CT dec 22nd 2020- [compared to [facility] sep 9th 2020]-Liver- slight progression versus stable disease; CT scan SEP 4th 2021- Progressive disease-left lower lobe lung nodule 8 mm [previously 4 mm]; increase in size of the hepatic metastases by few millimeters.;  Soft tissue nodule adjacent to anastomotic site again increased by few millimeters.STOP FOLFOX; cont avastin ? ?#  SEP 20th, 2021- FOLFIRI+ AVASTIN; HOLD FEB 2022- sec to poor tolerance [peptic ulcer]; 3/30-liver ablation-   [bland embolization of segment 2/3 liver lesion on 12/19/2020;  microwave ablation of both liver lesions. ? ?# ?July 25th, 2022- BEV _ TAS 102.MAY 2022- s/p Liver ablation ? ?# DEC  2022-progressive disease-lung and liver/abdomen; JAN 31st 2023-START Regorefenib.  ? ?# FEB /16-EGD [Dr.Anna-duodenal ulcer-? meloxicam] ? ?# JAN 2022- COVID [s/p Mab infusion; pills; skin rash resolved.] ? ?# NGS/F-ONE-MUTATED K-RAS [G] ? ? ? ? ?  ?Cancer of right colon ([place])  ?07/05/2019 Initial Diagnosis  ? Cancer of right colon [facility]) ?  ?07/24/2019 - 06/25/2020 Chemotherapy  ? The patient had dexamethasone (DECADRON) 4 MG tablet, 8 mg, Oral, Daily, 1 of 1 cycle, Start date: --, End date: -- ?palonosetron (ALOXI) injection 0.25 mg, 0.25 mg, Intravenous,  Once, 23 of 25 cycles ?Administration: 0.25 mg (07/24/2019), 0.25 mg (08/07/2019), 0.25 mg (08/21/2019), 0.25 mg (09/04/2019), 0.25 mg (09/20/2019), 0.25 mg (10/04/2019), 0.25 mg (10/16/2019), 0.25 mg (10/30/2019), 0.25 mg (11/22/2019), 0.25 mg (12/06/2019), 0.25 mg (12/20/2019), 0.25 mg (01/03/2020), 0.25 mg (01/29/2020), 0.25 mg (02/12/2020), 0.25 mg (02/26/2020), 0.25 mg (03/11/2020), 0.25 mg (03/25/2020), 0.25 mg (04/08/2020), 0.25 mg (04/24/2020), 0.25 mg (05/08/2020), 0.25 mg (05/22/2020), 0.25 mg (06/10/2020), 0.25 mg (06/25/2020) ?leucovorin 800 mg in dextrose 5 % 250 mL infusion, 844 mg, Intravenous,  Once, 23 of 25 cycles ?Administration: 800 mg (07/24/2019), 800 mg (08/07/2019), 800 mg (08/21/2019), 800 mg (09/04/2019), 800 mg (09/20/2019), 800 mg (10/04/2019), 800 mg (10/16/2019), 800 mg (10/30/2019), 800 mg (11/22/2019), 800 mg (12/06/2019), 800 mg (12/20/2019), 800 mg (01/03/2020), 800 mg (01/29/2020), 800 mg (02/12/2020), 800 mg (02/26/2020), 800 mg (03/11/2020), 800 mg (03/25/2020), 800 mg (04/08/2020), 800 mg (04/24/2020), 800 mg (05/08/2020), 800 mg (05/22/2020), 800 mg (06/10/2020), 800 mg (06/25/2020) ?oxaliplatin (ELOXATIN) 180 mg in dextrose 5 % 500 mL chemo infusion, 85 mg/m2 = 180 mg, Intravenous,  Once, 23 of 25 cycles ?Dose modification: 178 mg (original dose 85 mg/m2, Cycle 17, Reason: Other (see comments), Comment: insurance adjusted dose ) ?Administration: 180 mg (07/24/2019), 180  mg  (08/07/2019), 180 mg (08/21/2019), 180 mg (09/04/2019), 180 mg (09/20/2019), 180 mg (10/04/2019), 180 mg (10/16/2019), 180 mg (10/30/2019), 180 mg (11/22/2019), 180 mg (12/06/2019), 180 mg (12/20/2019), 180 mg (01/03/2020), 180 mg (01/29/2020), 180 mg (02/12/2020), 180 mg (02/26/2020), 180 mg (03/11/2020), 180 mg (04/08/2020), 180 mg (04/24/2020), 180 mg (05/08/2020), 180 mg (05/22/2020), 180 mg (06/10/2020), 180 mg (06/25/2020) ?fluorouracil (ADRUCIL) 5,000 mg in sodium chloride 0.9 % 150 mL chemo infusion, 5,050 mg, Intravenous, 1 Day/Dose, 23 of 25 cycles ?Administration: 5,000 mg (07/24/2019), 5,000 mg (08/07/2019), 5,000 mg (08/21/2019), 5,050 mg (09/04/2019), 5,000 mg (10/04/2019), 5,000 mg (10/16/2019), 5,000 mg (11/22/2019), 5,000 mg (12/06/2019), 5,000 mg (12/20/2019), 5,000 mg (01/03/2020), 5,000 mg (01/29/2020), 5,000 mg (02/12/2020), 5,000 mg (02/26/2020), 5,000 mg (03/11/2020), 5,000 mg (03/25/2020), 5,000 mg (04/08/2020), 5,000 mg (04/24/2020), 5,000 mg (05/08/2020), 5,000 mg (05/22/2020), 5,000 mg (06/10/2020), 5,000 mg (06/25/2020) ?bevacizumab-bvzr (ZIRABEV) 400 mg in sodium chloride 0.9 % 100 mL chemo infusion, 5 mg/kg = 400 mg, Intravenous,  Once, 23 of 25 cycles ?Administration: 400 mg (08/07/2019), 400 mg (08/21/2019), 400 mg (09/04/2019), 400 mg (09/20/2019), 400 mg (10/04/2019), 400 mg (10/16/2019), 400 mg (10/30/2019), 400 mg (11/22/2019), 400 mg (12/06/2019), 400 mg (12/20/2019), 400 mg (01/03/2020), 400 mg (01/29/2020), 400 mg (02/12/2020), 400 mg (02/26/2020), 400 mg (03/11/2020), 400 mg (03/25/2020), 400 mg (04/08/2020), 400 mg (04/24/2020), 400 mg (05/08/2020), 400 mg (05/22/2020), 400 mg (06/10/2020), 400 mg (06/25/2020) ? ? for chemotherapy treatment.  ? ?  ?07/08/2020 -  Chemotherapy  ? Patient is on Treatment Plan : COLORECTAL FOLFIRI / BEVACIZUMAB Q14D  ? ?  ?  ?11/05/2020 Cancer Staging  ? Staging form: Colon and Rectum, AJCC 8th Edition ?- Clinical: Stage IVC (pM1c) - Signed by Cammie Sickle, MD on 11/05/2020 ? ?  ? ?HISTORY OF PRESENTING  ILLNESS: He is accompanied by his daughter.  Ambulating independently. ? ?Oscar [name] 81 y.o.  male with metastatic colon cancer to the liver-s/p multiple lines of therapy  most recently progressed on stivarga [2 pills a day ]x 3 weeks [3 week on and 1 week off]-is here for follow-up. ? ?.  Denies any nausea vomiting.  Denies any fevers or chills. Continues to have abdominal pain for which he has been taking oxycodone 5 mg up to 3 to 4 pills a day. No fever no chills.  No sores in the mouth.  No rash on palms and soles. ? ?Review of Systems  ?Constitutional:  Positive for malaise/fatigue. Negative for chills, diaphoresis and fever.  ?HENT:  Negative for nosebleeds and sore throat.   ?Eyes:  Negative for double vision.  ?Respiratory:  Negative for cough, hemoptysis, sputum production, shortness of breath and wheezing.   ?Cardiovascular:  Negative for chest pain, palpitations, orthopnea and leg swelling.  ?Gastrointestinal:  Negative for blood in stool, constipation, heartburn, melena, nausea and vomiting.  ?Genitourinary:  Negative for dysuria, frequency and urgency.  ?Musculoskeletal:  Positive for back pain and joint pain.  ?Skin: Negative.  Negative for itching and rash.  ?Neurological:  Positive for tingling. Negative for focal weakness, weakness and headaches.  ?Endo/Heme/Allergies:  Does not bruise/bleed easily.  ?Psychiatric/Behavioral:  Negative for depression. The patient is not nervous/anxious and does not have insomnia.    ? ?MEDICAL HISTORY:  ?Past Medical History:  ?Diagnosis Date  ? Arthritis   ? Cancer [facility])   ? colon cancer 02/2019 per pt   ? Family history of adverse reaction to anesthesia   ? cousin took all night to wake up from  anesthesia  ? H/O colon cancer, stage IV   ? Hyperlipemia   ? Neuromuscular disorder ([place])   ? Pre-diabetes   ? ? ?SURGICAL HISTORY: ?Past Surgical History:  ?Procedure Laterality Date  ? COLON SURGERY    ? ESOPHAGOGASTRODUODENOSCOPY (EGD) WITH PROPOFOL N/A 12/26/2020  ?  Procedure: ESOPHAGOGASTRODUODENOSCOPY (EGD) WITH PROPOFOL;  Surgeon: [name], MD;  Location[facility] ENDOSCOPY;  Service: Gastroenterology;  Laterality: N/A;  ? IR ANGIOGRAM SELECTIVE EACH ADDITIONAL VESSEL  12/20/18

## 2022-02-06 NOTE — Patient Instructions (Signed)
[facility] CANCER CTR AT [place]  Discharge Instructions: ?Thank you for choosing [facility] to provide your oncology and hematology care.  ?If you have a lab appointment with the [facility], please go directly to the [facility] and check in at the registration area. ? ?Wear comfortable clothing and clothing appropriate for easy access to any Portacath or PICC line.  ? ?We strive to give you quality time with your provider. You may need to reschedule your appointment if you arrive late (15 or more minutes).  Arriving late affects you and other patients whose appointments are after yours.  Also, if you miss three or more appointments without notifying the office, you may be dismissed from the clinic at the provider?s discretion.    ?  ?For prescription refill requests, have your pharmacy contact our office and allow 72 hours for refills to be completed.   ? ?Today you received the following chemotherapy and/or immunotherapy agents: bevacizumab-bvzr, fluorouracil, irinotecan  ?  ?To help prevent nausea and vomiting after your treatment, we encourage you to take your nausea medication as directed. ? ?BELOW ARE SYMPTOMS THAT SHOULD BE REPORTED IMMEDIATELY: ?*FEVER GREATER THAN 100.4 F (38 ?C) OR HIGHER ?*CHILLS OR SWEATING ?*NAUSEA AND VOMITING THAT IS NOT CONTROLLED WITH YOUR NAUSEA MEDICATION ?*UNUSUAL SHORTNESS OF BREATH ?*UNUSUAL BRUISING OR BLEEDING ?*URINARY PROBLEMS (pain or burning when urinating, or frequent urination) ?*BOWEL PROBLEMS (unusual diarrhea, constipation, pain near the anus) ?TENDERNESS IN MOUTH AND THROAT WITH OR WITHOUT PRESENCE OF ULCERS (sore throat, sores in mouth, or a toothache) ?UNUSUAL RASH, SWELLING OR PAIN  ?UNUSUAL VAGINAL DISCHARGE OR ITCHING  ? ?Items with * indicate a potential emergency and should be followed up as soon as possible or go to the Emergency Department if any problems should occur. ? ?Please show the CHEMOTHERAPY ALERT CARD or  IMMUNOTHERAPY ALERT CARD at check-in to the Emergency Department and triage nurse. ? ?Should you have questions after your visit or need to cancel or reschedule your appointment, please contact [facility] CANCER [place] AT [place]  [phone] and follow the prompts.  Office hours are 8:00 a.m. to 4:30 p.m. Monday - Friday. Please note that voicemails left after 4:00 p.m. may not be returned until the following business day.  We are closed weekends and major holidays. You have access to a nurse at all times for urgent questions. Please call the main number to the clinic [phone] and follow the prompts. ? ?For any non-urgent questions, you may also contact your provider using MyChart. We now offer e-Visits for anyone 3 and older to request care online for non-urgent symptoms. For details visit [email or web address]. ?  ?Also download the MyChart app! Go to the app store, search "MyChart", open the app, select [facility], and log in with your MyChart username and password. ? ?Due to Covid, a mask is required upon entering the hospital/clinic. If you do not have a mask, one will be given to you upon arrival. For doctor visits, patients may have 1 support person aged 68 or older with them. For treatment visits, patients cannot have anyone with them due to current Covid guidelines and our immunocompromised population.  ?

## 2022-02-06 NOTE — Progress Notes (Signed)
Patient tolerated Bevacizumab/ Zirabev infusion well, no questions/concerns voiced. Patient educated on chemo pump, applied.  Patient stable at discharge. AVS given.   ?

## 2022-02-06 NOTE — Assessment & Plan Note (Addendum)
#  Right-sided colon adenocarcinoma-with synchronous metastasis to liver/unresectabls;  CT scan dec 2022-progression of disease noted with increasing lung nodules/left moderate pleural effusion; new liver lesion; soft tissue implant anterior to the right kidney ~ 5cm in size. MRI JAN 2023- Numerous (at least 8) new or enlarging small liver metastases; Recurrent viable malignancy within previously treated large segment 8 and anterior left liver metastases;  Progressive metastatic retroperitoneal lymphadenopathy;  Progressive multifocal right peritoneal metastases; Progressive right adrenal and upper right renal metastases. The upper right renal metastasis either increasingly encases or invades the right renal vein; Progressive pulmonary metastases at the lung bases;  Multifocal bulky left pleural metastases and moderate left pleural effusion, not substantially changed.  ? ?# Clinically progressing; CEA 649.  DISCONTINUE STIRIVARGA. Awaiting scan on 4/24. ? Candidate for Kras inhibitor [pt has Kras G12V; but sotorasib for K ras G12C]; await second opinion at [facility]; encouraged to make appt.  ? ?# proceed with FOLFIRI+ zira-bev. Labs today reviewed;  acceptable for treatment today.  UA shows: Elevated protein; okay with Bev today; will check protein creatinine ratio next visit  ? ?Discussed the potential side effects including but not limited to-increasing fatigue, nausea vomiting, diarrhea, hair loss, sores in the mouth, increase risk of infection and also neuropathy.  ? ?# Right upper quadrant pain-secondary to malignancy-s/p evaluation with [name]; continue oxycodone 5 mg up to 3-4 pills a day as needed.  STABLE. ? ?#Weight loss: wants to hold off low-dose prednisone 20 mg a day; recommend starting prednisone.  ? ?# Mediport- functioning-STABLE> ?# DISPOSITION:   ?# Chemo today; pump off on 4/24 ? ?# follow up on May 8th,-  MD; labs- cbc/cmp CEA;urine protein creatinine ratio;  FOLFIRI + Bev; pump off  D-3;-Dr.B ? ? ?

## 2022-02-06 NOTE — Telephone Encounter (Signed)
I spoke with the pt today at his appt, he will try to call them back to get an appt. ?

## 2022-02-09 ENCOUNTER — Ambulatory Visit: Admit: 2022-02-09 | Payer: Medicare HMO

## 2022-02-09 ENCOUNTER — Ambulatory Visit
Admission: RE | Admit: 2022-02-09 | Discharge: 2022-02-09 | Disposition: A | Discharge status: Home / Self Care | Payer: Medicare HMO | Source: Ambulatory Visit | Attending: Internal Medicine | Admitting: Internal Medicine

## 2022-02-09 ENCOUNTER — Inpatient Hospital Stay: Payer: Medicare HMO

## 2022-02-09 ENCOUNTER — Other Ambulatory Visit: Payer: Self-pay

## 2022-02-09 VITALS — BP 171/77 | HR 81 | Temp 96.0°F | Resp 18

## 2022-02-09 DIAGNOSIS — K838 Other specified diseases of biliary tract: Secondary | ICD-10-CM | POA: Diagnosis not present

## 2022-02-09 DIAGNOSIS — Z7189 Other specified counseling: Secondary | ICD-10-CM

## 2022-02-09 DIAGNOSIS — C782 Secondary malignant neoplasm of pleura: Secondary | ICD-10-CM | POA: Diagnosis not present

## 2022-02-09 DIAGNOSIS — C787 Secondary malignant neoplasm of liver and intrahepatic bile duct: Secondary | ICD-10-CM | POA: Diagnosis not present

## 2022-02-09 DIAGNOSIS — I2510 Atherosclerotic heart disease of native coronary artery without angina pectoris: Secondary | ICD-10-CM | POA: Diagnosis not present

## 2022-02-09 DIAGNOSIS — C182 Malignant neoplasm of ascending colon: Secondary | ICD-10-CM

## 2022-02-09 DIAGNOSIS — C189 Malignant neoplasm of colon, unspecified: Secondary | ICD-10-CM | POA: Diagnosis not present

## 2022-02-09 DIAGNOSIS — I714 Abdominal aortic aneurysm, without rupture, unspecified: Secondary | ICD-10-CM | POA: Diagnosis not present

## 2022-02-09 DIAGNOSIS — C786 Secondary malignant neoplasm of retroperitoneum and peritoneum: Secondary | ICD-10-CM | POA: Diagnosis not present

## 2022-02-09 DIAGNOSIS — C7971 Secondary malignant neoplasm of right adrenal gland: Secondary | ICD-10-CM | POA: Diagnosis not present

## 2022-02-09 DIAGNOSIS — C7800 Secondary malignant neoplasm of unspecified lung: Secondary | ICD-10-CM | POA: Diagnosis not present

## 2022-02-09 DIAGNOSIS — Z5112 Encounter for antineoplastic immunotherapy: Secondary | ICD-10-CM | POA: Diagnosis not present

## 2022-02-09 DIAGNOSIS — Z452 Encounter for adjustment and management of vascular access device: Secondary | ICD-10-CM | POA: Diagnosis not present

## 2022-02-09 DIAGNOSIS — J90 Pleural effusion, not elsewhere classified: Secondary | ICD-10-CM | POA: Diagnosis not present

## 2022-02-09 DIAGNOSIS — Z5111 Encounter for antineoplastic chemotherapy: Secondary | ICD-10-CM | POA: Diagnosis not present

## 2022-02-09 DIAGNOSIS — K769 Liver disease, unspecified: Secondary | ICD-10-CM | POA: Diagnosis not present

## 2022-02-09 IMAGING — CT CT CHEST-ABD-PELV W/ CM
2 of 5 series · 11 of 36 positions shown, 12 images · IV contrast (agent unspecified)
Comparison: Chest and pelvis CT [DATE].  Abdomen MR [DATE]

CLINICAL DATA: Colon cancer.  Restaging.  * Tracking Code: BO *

EXAM:
CT CHEST, ABDOMEN, AND PELVIS WITH CONTRAST
TECHNIQUE: Multidetector CT imaging of the chest, abdomen and pelvis was
performed following the standard protocol during bolus
administration of intravenous contrast.

[Series 3: cap with · axial · 0.83mm/px · z∈[-665,-70]mm · 8 of 147 slices shown, 9 images]
[im 14/147  mediastinal]
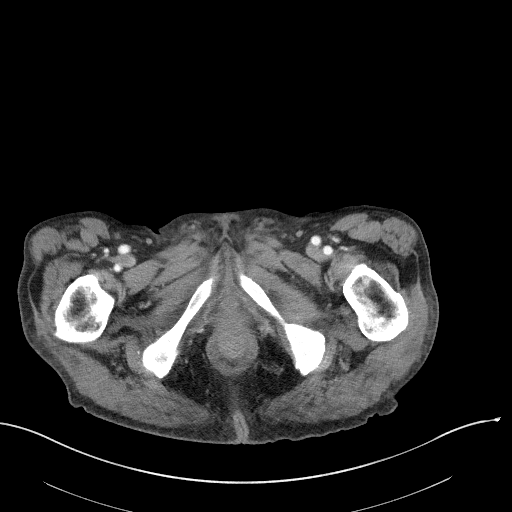
[im 14/147  bone]
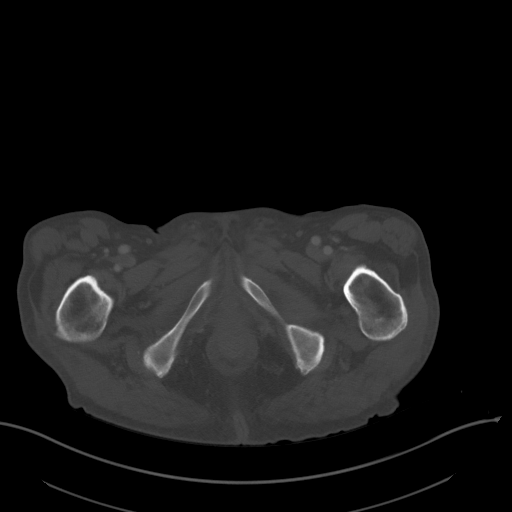
[im 27/147  mediastinal]
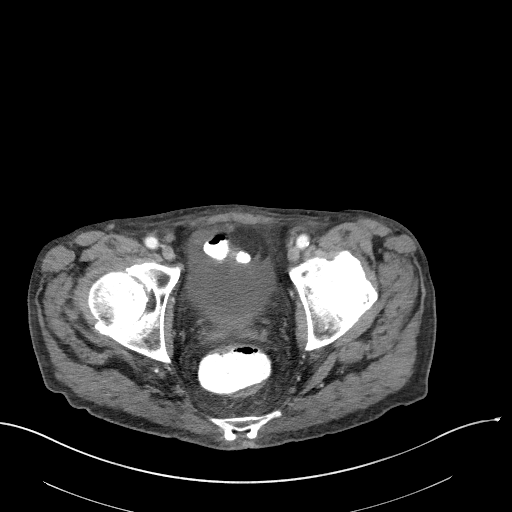
[im 54/147  mediastinal]
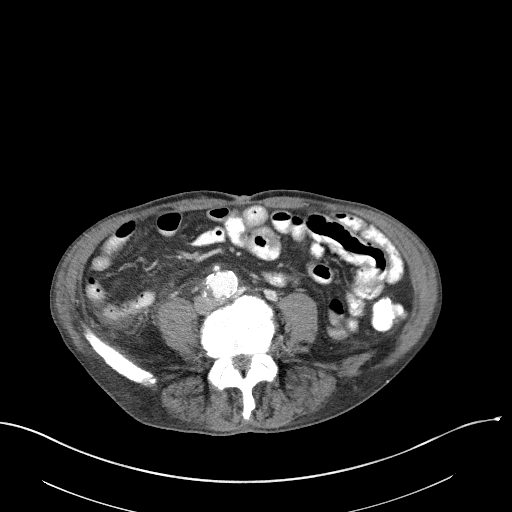
[im 67/147  mediastinal]
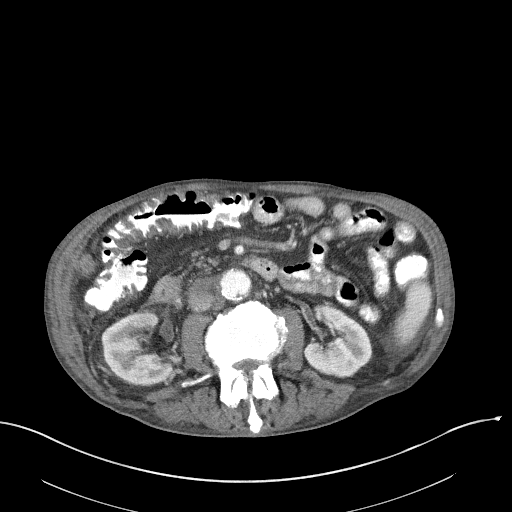
[im 80/147  mediastinal]
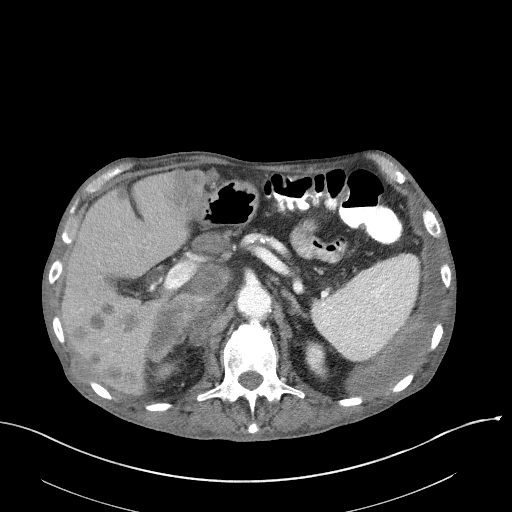
[im 93/147  mediastinal]
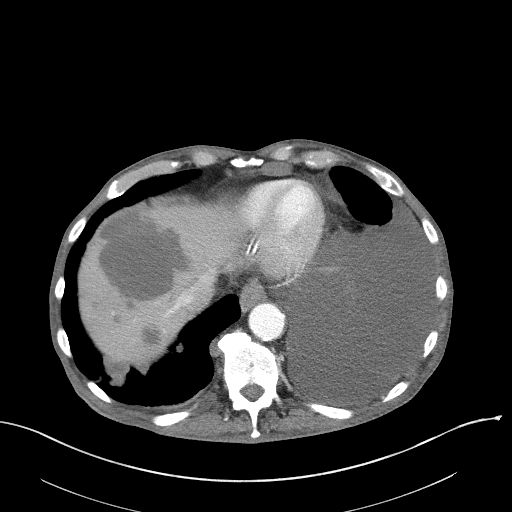
[im 120/147  mediastinal]
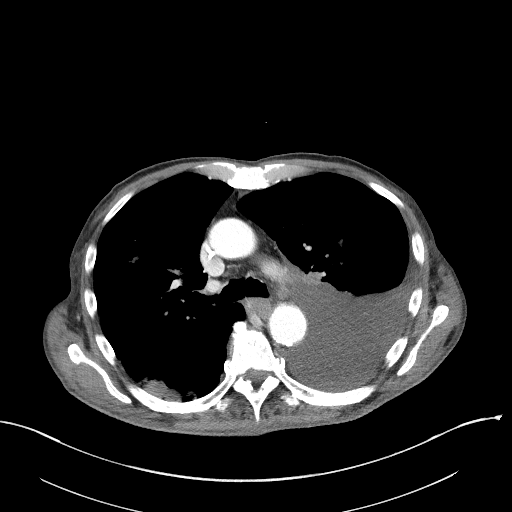
[im 133/147  mediastinal]
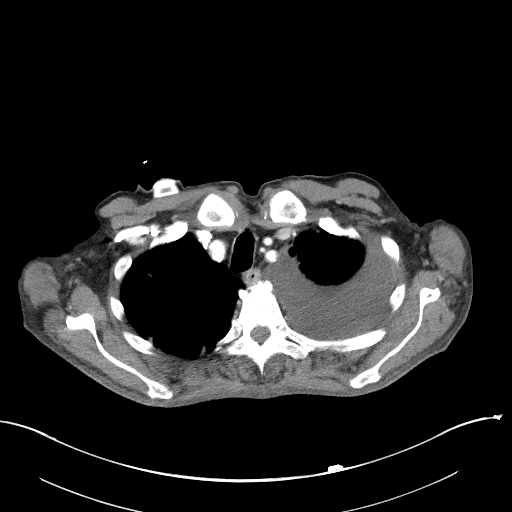

[Series 4: coronals · coronal · 0.86mm/px · 3 of 165 slices shown]
[im 33/165  mediastinal]
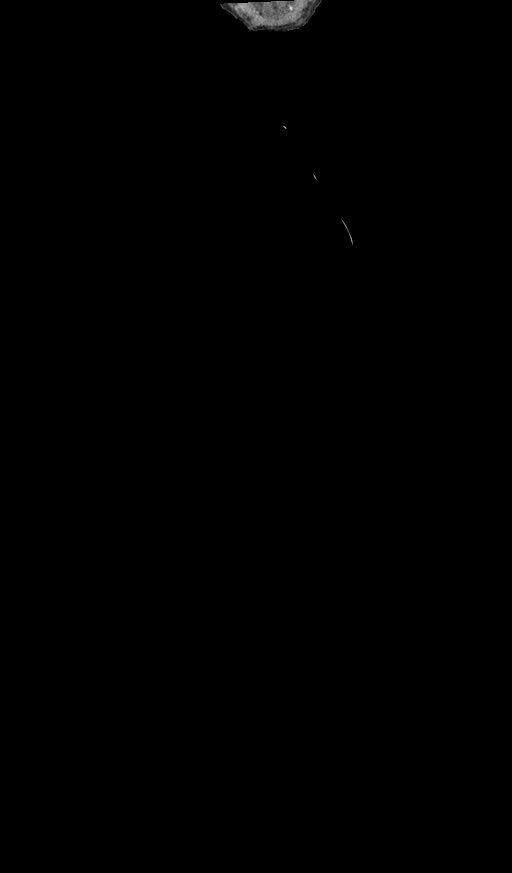
[im 66/165  mediastinal]
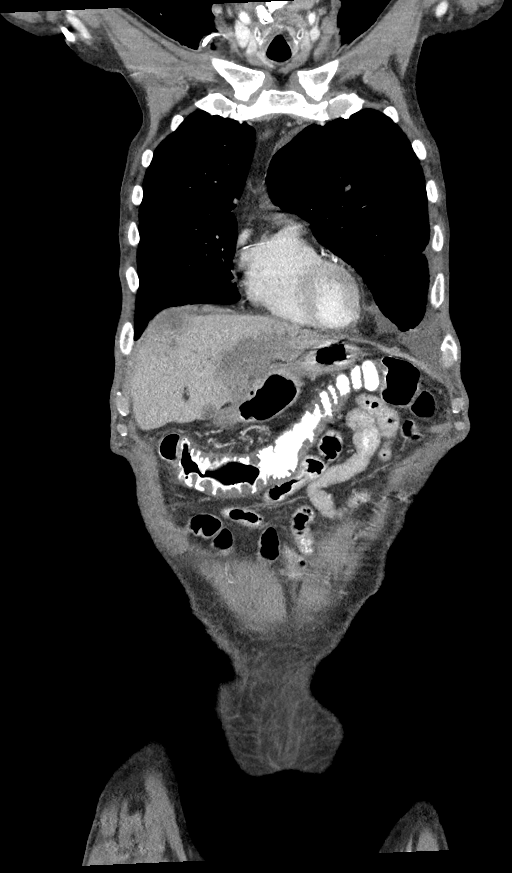
[im 99/165  mediastinal]
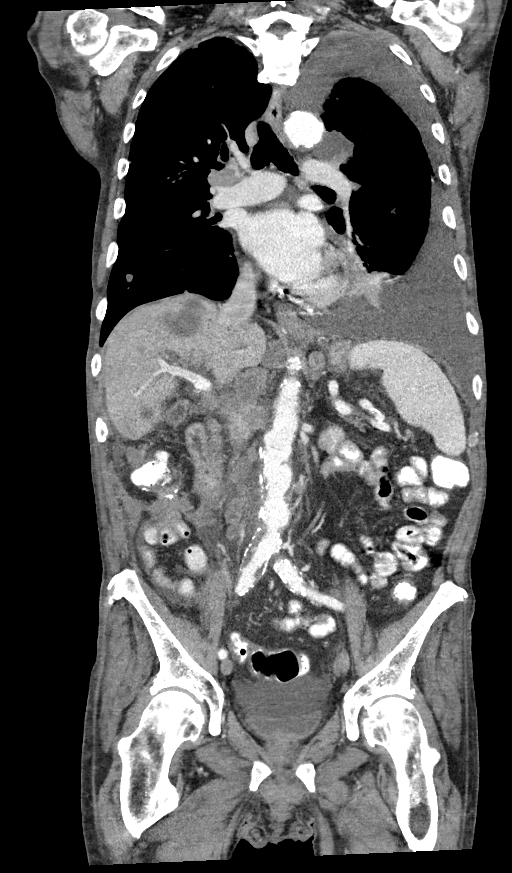

[11 of 36 positions shown; findings below may reference images not displayed]

RADIATION DOSE REDUCTION: This exam was performed according to the
departmental dose-optimization program which includes automated
exposure control, adjustment of the mA and/or kV according to
patient size and/or use of iterative reconstruction technique.

CONTRAST:  80mL OMNIPAQUE IOHEXOL 300 MG/ML  SOLN
FINDINGS: CT CHEST FINDINGS

Cardiovascular: The heart size is normal. No substantial pericardial
effusion. Coronary artery calcification is evident. Mild
atherosclerotic calcification is noted in the wall of the thoracic
aorta. Right Port-A-Cath tip is in the distal SVC

Mediastinum/Nodes: Interval progression of mediastinal and hilar
lymphadenopathy. 2.4 cm right hilar lymph node on [DATE] was 1.0 cm
previously (remeasured). Index left hilar node measured previously
axis subcarinal node on 36/3 is new in the interval. No axillary
lymphadenopathy.

Lungs/Pleura: Interval development of numerous new bilateral
pulmonary nodules with progression of pre-existing pulmonary
nodules. 11 mm right lower lobe nodule on 97/2 is new since prior.
11 mm left upper lobe nodule on 96/2 is new since prior. 17 mm
subpleural right lower lobe nodule on 70/2 was 7 mm previously. 17
mm right lower lobe nodule on [DATE] was 5 mm previously. 12 mm
anterior right upper lobe nodule on 88/2 was 10 mm previously.
Moderate left pleural effusion again noted.

Musculoskeletal: No worrisome lytic or sclerotic osseous
abnormality.

CT ABDOMEN PELVIS FINDINGS

Hepatobiliary: Interval development of multiple new liver metastases
since the previous CT scan although these were visible on the MRI
better progressive since that study of [DATE]. 8 mm inferior
right lower lobe lesion measured on the MRI previously is now 15 mm
on image 70/3. Posterior right hepatic lobe lesion measured
previously at 11 mm is now 20 mm on image 58/3. Previously measured
lesion in the dome of the right liver at 7 mm is 15 mm today on
59/3. Dominant large lesion towards the dome is similar. 15 mm
posterior right hepatic lobe lesion on 67/3 is new.

Gallbladder unremarkable. Similar mild extrahepatic biliary duct
dilatation (common bile duct measuring 8 mm diameter in the head of
the pancreas.

Pancreas: No focal mass lesion. No dilatation of the main duct. No
intraparenchymal cyst. No peripancreatic edema.

Spleen: No splenomegaly. No focal mass lesion.

Adrenals/Urinary Tract: Interval progression of the right adrenal
metastatic lesion with progression of the anterior right upper pole
renal metastatic deposit measuring 4.2 x 4.4 cm today compared to
3.5 x 2.6 cm previously this amorphous mass encases the right renal
artery and appears to invade the right renal vein (see image 73/3)
extending to the confluence of the IVC. Small cyst lower pole left
kidney again noted. No evidence for hydroureter. The urinary bladder
appears normal for the degree of distention.

Stomach/Bowel: Stomach is unremarkable. No gastric wall thickening.
No evidence of outlet obstruction. Duodenum is normally positioned
as is the ligament of Treitz. No small bowel wall thickening. No
small bowel dilatation. Status post right hemicolectomy. Calcified
spiculated lesion just inferior to the anastomosis and just anterior
to the lower pole right kidney measures 3.5 x 4.1 cm today compared
to 4.4 x 4.1 cm previously (remeasured). Left colon unremarkable.

Vascular/Lymphatic: Infrarenal abdominal aorta measures up to 3.2 cm
diameter. Moderate atherosclerotic calcification associated.
Necrotic lymphadenopathy is seen in the hepatoduodenal ligament.
Index 2.8 cm short axis lymph node measured previously is 3.1 cm
short axis on image 66/3 today. 1.9 cm short axis anterior
hepatoduodenal ligament lymph node on 67/3 is new in the interval.
Right para-aortic lymphadenopathy compresses the IVC and left renal
vein (image 72/3). Retrocaval lymphadenopathy again noted.
Borderline aortocaval lymphadenopathy on 89/3 is similar. No pelvic
sidewall lymphadenopathy.

Reproductive: The prostate gland and seminal vesicles are
unremarkable.

Other: Small volume free fluid noted in the abdomen and pelvis.

2 cm omental nodule in the hepatic flexure (80/3) was 1.1 cm
previously. 2.1 cm nodule in the right paramidline gastrocolic
ligament (72/3) was 1.2 cm previously. 11 mm nodule in the midline
rectus sheath (87/3) is new in the interval. New
nodularity/lymphadenopathy is seen in the ileocolic mesentery (image
92/3).

Musculoskeletal: No worrisome lytic or sclerotic osseous
abnormality.
IMPRESSION: 1. Interval progression of metastatic disease in the chest, abdomen,
and pelvis.
2. Interval development of numerous new bilateral pulmonary nodules
with progression of pre-existing pulmonary nodules.
3. Interval progression of mediastinal and hilar lymphadenopathy.
4. Interval development of multiple new liver metastases since the
previous CT scan. Although these were visible on the MRI of
[DATE], many of the liver lesions are progressive since that
time.
5. Interval progression of the right adrenal metastatic lesion with
progression of the amorphous mass just inferior to the anastomosis
and just anterior to the lower pole right kidney.
6. Metastatic lesion in the upper pole right kidney is progressive
and now peers to invade the right renal vein to the level of the
confluence with the IVC.
7. Interval progression of omental, gastrocolic ligament, and
mesenteric metastases.
8. Small volume free fluid in the abdomen and pelvis.
9. 3.2 cm abdominal aortic aneurysm. Attention on follow-up
recommended.
10. Aortic Atherosclerosis ([36]-[36]).

## 2022-02-09 MED ORDER — SODIUM CHLORIDE 0.9% FLUSH
10.0000 mL | INTRAVENOUS | Status: DC | PRN
  Administered 2022-02-09: 10 mL
  Filled 2022-02-09: qty 10

## 2022-02-09 MED ORDER — IOHEXOL 300 MG/ML  SOLN
80.0000 mL | Freq: Once | INTRAMUSCULAR | Status: AC | PRN
  Administered 2022-02-09: 80 mL via INTRAVENOUS

## 2022-02-09 MED ORDER — HEPARIN SOD (PORK) LOCK FLUSH 100 UNIT/ML IV SOLN
500.0000 [IU] | Freq: Once | INTRAVENOUS | Status: AC | PRN
  Administered 2022-02-09: 500 [IU]
  Filled 2022-02-09: qty 5

## 2022-02-10 ENCOUNTER — Other Ambulatory Visit: Payer: Self-pay | Admitting: *Deleted

## 2022-02-10 MED ORDER — OXYCODONE HCL 5 MG PO TABS: 5.0000 mg | ORAL_TABLET | Freq: Four times a day (QID) | ORAL | 0 refills | Status: DC | PRN

## 2022-02-17 ENCOUNTER — Other Ambulatory Visit: Payer: Medicare HMO

## 2022-02-18 DIAGNOSIS — C189 Malignant neoplasm of colon, unspecified: Secondary | ICD-10-CM | POA: Diagnosis not present

## 2022-02-18 DIAGNOSIS — C787 Secondary malignant neoplasm of liver and intrahepatic bile duct: Secondary | ICD-10-CM | POA: Diagnosis not present

## 2022-02-23 ENCOUNTER — Inpatient Hospital Stay (HOSPITAL_BASED_OUTPATIENT_CLINIC_OR_DEPARTMENT_OTHER): Payer: Medicare HMO | Admitting: Internal Medicine

## 2022-02-23 ENCOUNTER — Inpatient Hospital Stay: Payer: Medicare HMO

## 2022-02-23 ENCOUNTER — Other Ambulatory Visit: Payer: Medicare HMO

## 2022-02-23 ENCOUNTER — Inpatient Hospital Stay: Payer: Medicare HMO | Attending: Hospice and Palliative Medicine

## 2022-02-23 ENCOUNTER — Ambulatory Visit: Payer: Medicare HMO | Admitting: Internal Medicine

## 2022-02-23 ENCOUNTER — Encounter: Payer: Self-pay | Admitting: Internal Medicine

## 2022-02-23 DIAGNOSIS — Z5112 Encounter for antineoplastic immunotherapy: Secondary | ICD-10-CM | POA: Insufficient documentation

## 2022-02-23 DIAGNOSIS — C787 Secondary malignant neoplasm of liver and intrahepatic bile duct: Secondary | ICD-10-CM | POA: Diagnosis not present

## 2022-02-23 DIAGNOSIS — Z452 Encounter for adjustment and management of vascular access device: Secondary | ICD-10-CM | POA: Insufficient documentation

## 2022-02-23 DIAGNOSIS — Z7189 Other specified counseling: Secondary | ICD-10-CM

## 2022-02-23 DIAGNOSIS — C182 Malignant neoplasm of ascending colon: Secondary | ICD-10-CM

## 2022-02-23 DIAGNOSIS — Z5111 Encounter for antineoplastic chemotherapy: Secondary | ICD-10-CM | POA: Insufficient documentation

## 2022-02-23 LAB — URINALYSIS, COMPLETE (UACMP) WITH MICROSCOPIC
Bacteria, UA: NONE SEEN
Bilirubin Urine: NEGATIVE
Glucose, UA: NEGATIVE mg/dL
Ketones, ur: NEGATIVE mg/dL
Leukocytes,Ua: NEGATIVE
Nitrite: NEGATIVE
Protein, ur: 30 mg/dL — AB
Specific Gravity, Urine: 1.02 (ref 1.005–1.030)
pH: 5 (ref 5.0–8.0)

## 2022-02-23 LAB — CBC WITH DIFFERENTIAL/PLATELET
Abs Immature Granulocytes: 0 10*3/uL (ref 0.00–0.07)
Basophils Absolute: 0 10*3/uL (ref 0.0–0.1)
Basophils Relative: 1 %
Eosinophils Absolute: 0.4 10*3/uL (ref 0.0–0.5)
Eosinophils Relative: 14 %
HCT: 33.2 % — ABNORMAL LOW (ref 39.0–52.0)
Hemoglobin: 10.9 g/dL — ABNORMAL LOW (ref 13.0–17.0)
Immature Granulocytes: 0 %
Lymphocytes Relative: 25 %
Lymphs Abs: 0.7 10*3/uL (ref 0.7–4.0)
MCH: 32.1 pg (ref 26.0–34.0)
MCHC: 32.8 g/dL (ref 30.0–36.0)
MCV: 97.6 fL (ref 80.0–100.0)
Monocytes Absolute: 0.4 10*3/uL (ref 0.1–1.0)
Monocytes Relative: 12 %
Neutro Abs: 1.4 10*3/uL — ABNORMAL LOW (ref 1.7–7.7)
Neutrophils Relative %: 48 %
Platelets: 175 10*3/uL (ref 150–400)
RBC: 3.4 MIL/uL — ABNORMAL LOW (ref 4.22–5.81)
RDW: 16.3 % — ABNORMAL HIGH (ref 11.5–15.5)
WBC: 2.8 10*3/uL — ABNORMAL LOW (ref 4.0–10.5)
nRBC: 0 % (ref 0.0–0.2)

## 2022-02-23 LAB — PROTEIN / CREATININE RATIO, URINE
Creatinine, Urine: 357 mg/dL
Protein Creatinine Ratio: 0.11 mg/mg{Cre} (ref 0.00–0.15)
Total Protein, Urine: 41 mg/dL

## 2022-02-23 LAB — COMPREHENSIVE METABOLIC PANEL
ALT: 21 U/L (ref 0–44)
AST: 43 U/L — ABNORMAL HIGH (ref 15–41)
Albumin: 2.3 g/dL — ABNORMAL LOW (ref 3.5–5.0)
Alkaline Phosphatase: 198 U/L — ABNORMAL HIGH (ref 38–126)
Anion gap: 7 (ref 5–15)
BUN: 10 mg/dL (ref 8–23)
CO2: 26 mmol/L (ref 22–32)
Calcium: 8.2 mg/dL — ABNORMAL LOW (ref 8.9–10.3)
Chloride: 104 mmol/L (ref 98–111)
Creatinine, Ser: 0.91 mg/dL (ref 0.61–1.24)
GFR, Estimated: >60 mL/min (ref >60)
Glucose, Bld: 178 mg/dL — ABNORMAL HIGH (ref 70–99)
Potassium: 3.8 mmol/L (ref 3.5–5.1)
Sodium: 137 mmol/L (ref 135–145)
Total Bilirubin: 0.7 mg/dL (ref 0.3–1.2)
Total Protein: 7.7 g/dL (ref 6.5–8.1)

## 2022-02-23 MED ORDER — SODIUM CHLORIDE 0.9 % IV SOLN
Freq: Once | INTRAVENOUS | Status: AC
  Filled 2022-02-23: qty 250

## 2022-02-23 MED ORDER — DIPHENOXYLATE-ATROPINE 2.5-0.025 MG PO TABS
1.0000 | ORAL_TABLET | Freq: Once | ORAL | Status: AC
  Administered 2022-02-23: 1 via ORAL
  Filled 2022-02-23: qty 1

## 2022-02-23 MED ORDER — SODIUM CHLORIDE 0.9 % IV SOLN
5.0000 mg/kg | Freq: Once | INTRAVENOUS | Status: AC
  Administered 2022-02-23: 350 mg via INTRAVENOUS
  Filled 2022-02-23: qty 14

## 2022-02-23 MED ORDER — SODIUM CHLORIDE 0.9 % IV SOLN: 5.0000 mg/kg | Freq: Once | INTRAVENOUS | Status: DC

## 2022-02-23 MED ORDER — SODIUM CHLORIDE 0.9 % IV SOLN
10.0000 mg | Freq: Once | INTRAVENOUS | Status: AC
  Administered 2022-02-23: 10 mg via INTRAVENOUS
  Filled 2022-02-23: qty 10

## 2022-02-23 MED ORDER — SODIUM CHLORIDE 0.9 % IV SOLN
340.0000 mg | Freq: Once | INTRAVENOUS | Status: AC
  Administered 2022-02-23: 340 mg via INTRAVENOUS
  Filled 2022-02-23: qty 15

## 2022-02-23 MED ORDER — PALONOSETRON HCL INJECTION 0.25 MG/5ML
0.2500 mg | Freq: Once | INTRAVENOUS | Status: AC
  Administered 2022-02-23: 0.25 mg via INTRAVENOUS
  Filled 2022-02-23: qty 5

## 2022-02-23 MED ORDER — SODIUM CHLORIDE 0.9 % IV SOLN
5000.0000 mg | INTRAVENOUS | Status: DC
  Administered 2022-02-23: 5000 mg via INTRAVENOUS
  Filled 2022-02-23: qty 100

## 2022-02-23 NOTE — Patient Instructions (Signed)
[facility] AT [place]  Discharge Instructions: ?Thank you for choosing [facility] to provide your oncology and hematology care.  ?If you have a lab appointment with the [facility], please go directly to the [facility] and check in at the registration area. ? ?Wear comfortable clothing and clothing appropriate for easy access to any Portacath or PICC line.  ? ?We strive to give you quality time with your provider. You may need to reschedule your appointment if you arrive late (15 or more minutes).  Arriving late affects you and other patients whose appointments are after yours.  Also, if you miss three or more appointments without notifying the office, you may be dismissed from the clinic at the provider?s discretion.    ?  ?For prescription refill requests, have your pharmacy contact our office and allow 72 hours for refills to be completed.   ? ?Today you received the following chemotherapy and/or immunotherapy agents Avastin, Irinotecan, Adrucil  ?  ?To help prevent nausea and vomiting after your treatment, we encourage you to take your nausea medication as directed. ? ?BELOW ARE SYMPTOMS THAT SHOULD BE REPORTED IMMEDIATELY: ?*FEVER GREATER THAN 100.4 F (38 ?C) OR HIGHER ?*CHILLS OR SWEATING ?*NAUSEA AND VOMITING THAT IS NOT CONTROLLED WITH YOUR NAUSEA MEDICATION ?*UNUSUAL SHORTNESS OF BREATH ?*UNUSUAL BRUISING OR BLEEDING ?*URINARY PROBLEMS (pain or burning when urinating, or frequent urination) ?*BOWEL PROBLEMS (unusual diarrhea, constipation, pain near the anus) ?TENDERNESS IN MOUTH AND THROAT WITH OR WITHOUT PRESENCE OF ULCERS (sore throat, sores in mouth, or a toothache) ?UNUSUAL RASH, SWELLING OR PAIN  ?UNUSUAL VAGINAL DISCHARGE OR ITCHING  ? ?Items with * indicate a potential emergency and should be followed up as soon as possible or go to the Emergency Department if any problems should occur. ? ?Please show the CHEMOTHERAPY ALERT CARD or IMMUNOTHERAPY ALERT CARD  at check-in to the Emergency Department and triage nurse. ? ?Should you have questions after your visit or need to cancel or reschedule your appointment, please contact [facility] CANCER [place] AT [place]  [phone] and follow the prompts.  Office hours are 8:00 a.m. to 4:30 p.m. Monday - Friday. Please note that voicemails left after 4:00 p.m. may not be returned until the following business day.  We are closed weekends and major holidays. You have access to a nurse at all times for urgent questions. Please call the main number to the clinic [phone] and follow the prompts. ? ?For any non-urgent questions, you may also contact your provider using MyChart. We now offer e-Visits for anyone 38 and older to request care online for non-urgent symptoms. For details visit [email or web address]. ?  ?Also download the MyChart app! Go to the app store, search "MyChart", open the app, select [facility], and log in with your MyChart username and password. ? ?Due to Covid, a mask is required upon entering the hospital/clinic. If you do not have a mask, one will be given to you upon arrival. For doctor visits, patients may have 1 support person aged 77 or older with them. For treatment visits, patients cannot have anyone with them due to current Covid guidelines and our immunocompromised population.  ?

## 2022-02-23 NOTE — Progress Notes (Signed)
0945- Today's urine protein lab is pending. 02/06/2022 Urine Protein: 100. MD, Dr. Jacinto Reap, notified and aware. Per MD order: reference 02/06/2022 urine protein result and proceed with scheduled Zirabev, Irinotecan, and Fluorouracil treatment today. ?

## 2022-02-23 NOTE — Progress Notes (Signed)
[name] ?CONSULT NOTE ? ?Patient Care Team: ?Sofie Hartigan, MD as PCP - General (Family Medicine) ?Cammie Sickle, MD as Consulting Physician (Hematology and Oncology) ? ?CHIEF COMPLAINTS/PURPOSE OF CONSULTATION: Colon cancer ? ?#  ?Oncology History Overview Note  ?# MAY 2020- 3. 03/09/19 Liver biopsy. Microscopic examination shows malignant cells with glandular architecture consistent with adenocarcinoma. The malignant cells are positive for CK20 and CDX-2. These findings support the clinical impression of metastatic colon adenocarcinoma. ?4. 03/10/19 R hemicolectomy. Tumor site cecum. Adenocarcinoma. Mucinous features present. G2. No tumor deposits. Invades visceral peritoneum. No tumor perforation. LVI present. PNI not identified. All margins uninvolved. 1/12 LNs. PT4apN1. Periappendiceal inflammation c/w resolving abscess. Microsatellite stable (MSS). [Dr.Mettu; [facility]] ? ?# SEP 4th 2020 [compared to May 2020]  Interval increase in size of the metastases to the hepatic dome, The metastasis to the left hepatic lobe is unchanged; 2.  New subcentimeter hypoattenuating lesion in the inferior right hepatic lobe, incompletely characterized on CT. 3.  Postsurgical changes following right hemicolectomy. ? ?# OCT 2020- FOLFOX +avastin; CT dec 22nd 2020- [compared to [facility] sep 9th 2020]-Liver- slight progression versus stable disease; CT scan SEP 4th 2021- Progressive disease-left lower lobe lung nodule 8 mm [previously 4 mm]; increase in size of the hepatic metastases by few millimeters.;  Soft tissue nodule adjacent to anastomotic site again increased by few millimeters.STOP FOLFOX; cont avastin ? ?#  SEP 20th, 2021- FOLFIRI+ AVASTIN; HOLD FEB 2022- sec to poor tolerance [peptic ulcer]; 3/30-liver ablation-   [bland embolization of segment 2/3 liver lesion on 12/19/2020;  microwave ablation of both liver lesions. ? ?# ?July 25th, 2022- BEV _ TAS 102.MAY 2022- s/p Liver ablation ? ?# DEC  2022-progressive disease-lung and liver/abdomen; JAN 31st 2023-START Regorefenib.  ? ?# FEB /16-EGD [Dr.Anna-duodenal ulcer-? meloxicam] ? ?# JAN 2022- COVID [s/p Mab infusion; pills; skin rash resolved.] ? ?# NGS/F-ONE-MUTATED K-RAS [G] ? ? ? ? ?  ?Cancer of right colon ([place])  ?07/05/2019 Initial Diagnosis  ? Cancer of right colon [facility]) ?  ?07/24/2019 - 06/25/2020 Chemotherapy  ? The patient had dexamethasone (DECADRON) 4 MG tablet, 8 mg, Oral, Daily, 1 of 1 cycle, Start date: --, End date: -- ?palonosetron (ALOXI) injection 0.25 mg, 0.25 mg, Intravenous,  Once, 23 of 25 cycles ?Administration: 0.25 mg (07/24/2019), 0.25 mg (08/07/2019), 0.25 mg (08/21/2019), 0.25 mg (09/04/2019), 0.25 mg (09/20/2019), 0.25 mg (10/04/2019), 0.25 mg (10/16/2019), 0.25 mg (10/30/2019), 0.25 mg (11/22/2019), 0.25 mg (12/06/2019), 0.25 mg (12/20/2019), 0.25 mg (01/03/2020), 0.25 mg (01/29/2020), 0.25 mg (02/12/2020), 0.25 mg (02/26/2020), 0.25 mg (03/11/2020), 0.25 mg (03/25/2020), 0.25 mg (04/08/2020), 0.25 mg (04/24/2020), 0.25 mg (05/08/2020), 0.25 mg (05/22/2020), 0.25 mg (06/10/2020), 0.25 mg (06/25/2020) ?leucovorin 800 mg in dextrose 5 % 250 mL infusion, 844 mg, Intravenous,  Once, 23 of 25 cycles ?Administration: 800 mg (07/24/2019), 800 mg (08/07/2019), 800 mg (08/21/2019), 800 mg (09/04/2019), 800 mg (09/20/2019), 800 mg (10/04/2019), 800 mg (10/16/2019), 800 mg (10/30/2019), 800 mg (11/22/2019), 800 mg (12/06/2019), 800 mg (12/20/2019), 800 mg (01/03/2020), 800 mg (01/29/2020), 800 mg (02/12/2020), 800 mg (02/26/2020), 800 mg (03/11/2020), 800 mg (03/25/2020), 800 mg (04/08/2020), 800 mg (04/24/2020), 800 mg (05/08/2020), 800 mg (05/22/2020), 800 mg (06/10/2020), 800 mg (06/25/2020) ?oxaliplatin (ELOXATIN) 180 mg in dextrose 5 % 500 mL chemo infusion, 85 mg/m2 = 180 mg, Intravenous,  Once, 23 of 25 cycles ?Dose modification: 178 mg (original dose 85 mg/m2, Cycle 17, Reason: Other (see comments), Comment: insurance adjusted dose ) ?Administration: 180 mg (07/24/2019), 180  mg  (08/07/2019), 180 mg (08/21/2019), 180 mg (09/04/2019), 180 mg (09/20/2019), 180 mg (10/04/2019), 180 mg (10/16/2019), 180 mg (10/30/2019), 180 mg (11/22/2019), 180 mg (12/06/2019), 180 mg (12/20/2019), 180 mg (01/03/2020), 180 mg (01/29/2020), 180 mg (02/12/2020), 180 mg (02/26/2020), 180 mg (03/11/2020), 180 mg (04/08/2020), 180 mg (04/24/2020), 180 mg (05/08/2020), 180 mg (05/22/2020), 180 mg (06/10/2020), 180 mg (06/25/2020) ?fluorouracil (ADRUCIL) 5,000 mg in sodium chloride 0.9 % 150 mL chemo infusion, 5,050 mg, Intravenous, 1 Day/Dose, 23 of 25 cycles ?Administration: 5,000 mg (07/24/2019), 5,000 mg (08/07/2019), 5,000 mg (08/21/2019), 5,050 mg (09/04/2019), 5,000 mg (10/04/2019), 5,000 mg (10/16/2019), 5,000 mg (11/22/2019), 5,000 mg (12/06/2019), 5,000 mg (12/20/2019), 5,000 mg (01/03/2020), 5,000 mg (01/29/2020), 5,000 mg (02/12/2020), 5,000 mg (02/26/2020), 5,000 mg (03/11/2020), 5,000 mg (03/25/2020), 5,000 mg (04/08/2020), 5,000 mg (04/24/2020), 5,000 mg (05/08/2020), 5,000 mg (05/22/2020), 5,000 mg (06/10/2020), 5,000 mg (06/25/2020) ?bevacizumab-bvzr (ZIRABEV) 400 mg in sodium chloride 0.9 % 100 mL chemo infusion, 5 mg/kg = 400 mg, Intravenous,  Once, 23 of 25 cycles ?Administration: 400 mg (08/07/2019), 400 mg (08/21/2019), 400 mg (09/04/2019), 400 mg (09/20/2019), 400 mg (10/04/2019), 400 mg (10/16/2019), 400 mg (10/30/2019), 400 mg (11/22/2019), 400 mg (12/06/2019), 400 mg (12/20/2019), 400 mg (01/03/2020), 400 mg (01/29/2020), 400 mg (02/12/2020), 400 mg (02/26/2020), 400 mg (03/11/2020), 400 mg (03/25/2020), 400 mg (04/08/2020), 400 mg (04/24/2020), 400 mg (05/08/2020), 400 mg (05/22/2020), 400 mg (06/10/2020), 400 mg (06/25/2020) ? ? for chemotherapy treatment.  ? ?  ?07/08/2020 -  Chemotherapy  ? Patient is on Treatment Plan : COLORECTAL FOLFIRI / BEVACIZUMAB Q14D  ? ?  ?  ?11/05/2020 Cancer Staging  ? Staging form: Colon and Rectum, AJCC 8th Edition ?- Clinical: Stage IVC (pM1c) - Signed by Cammie Sickle, MD on 11/05/2020 ? ?  ? ?HISTORY OF PRESENTING  ILLNESS: He is alone.  Ambulating independently. ? ?Oscar [name] 81 y.o.  male with metastatic colon cancer to the liver-s/p multiple lines of therapy -currently on FOLFIRI + Bev is here follow-up. ? ?In the interim patient was evaluated at [facility] second opinion.  ? ?Patient is currently s/p chemotherapy cycle #1. ? ?Denies any nausea vomiting.  Denies any fevers or chills. Continues to have abdominal pain for which he has been taking oxycodone 5 mg up to 1-2 pill a day.  Denies any significant diarrhea from chemotherapy. ? ?No fever no chills.  No sores in the mouth.  No rash on palms and soles.  Notes to have lost weight.  However claims to have good appetite. ? ?Review of Systems  ?Constitutional:  Positive for malaise/fatigue. Negative for chills, diaphoresis and fever.  ?HENT:  Negative for nosebleeds and sore throat.   ?Eyes:  Negative for double vision.  ?Respiratory:  Negative for cough, hemoptysis, sputum production, shortness of breath and wheezing.   ?Cardiovascular:  Negative for chest pain, palpitations, orthopnea and leg swelling.  ?Gastrointestinal:  Negative for blood in stool, constipation, heartburn, melena, nausea and vomiting.  ?Genitourinary:  Negative for dysuria, frequency and urgency.  ?Musculoskeletal:  Positive for back pain and joint pain.  ?Skin: Negative.  Negative for itching and rash.  ?Neurological:  Positive for tingling. Negative for focal weakness, weakness and headaches.  ?Endo/Heme/Allergies:  Does not bruise/bleed easily.  ?Psychiatric/Behavioral:  Negative for depression. The patient is not nervous/anxious and does not have insomnia.    ? ?MEDICAL HISTORY:  ?Past Medical History:  ?Diagnosis Date  ? Arthritis   ? Cancer [facility])   ? colon cancer 02/2019 per pt   ?  Family history of adverse reaction to anesthesia   ? cousin took all night to wake up from anesthesia  ? H/O colon cancer, stage IV   ? Hyperlipemia   ? Neuromuscular disorder ([place])   ? Pre-diabetes   ? ? ?SURGICAL  HISTORY: ?Past Surgical History:  ?Procedure Laterality Date  ? COLON SURGERY    ? ESOPHAGOGASTRODUODENOSCOPY (EGD) WITH PROPOFOL N/A 12/26/2020  ? Procedure: ESOPHAGOGASTRODUODENOSCOPY (EGD) WITH PROPOFOL;  Surgeon:

## 2022-02-23 NOTE — Assessment & Plan Note (Addendum)
#  Right-sided colon adenocarcinoma-with synchronous metastasis to liver/unresectable;  CT scan April 2023- Interval progression of metastatic disease in the chest, abdomen,and pelvis ; Interval development of numerous new bilateral pulmonary nodules with progression of pre-existing pulmonary nodules. Interval progression of mediastinal and hilar lymphadenopathy;Interval development of multiple new liver metastases since the previous CT scan.  Interval progression of the right adrenal metastatic lesion with ?progression of the amorphous mass just inferior to the anastomosis and just anterior to the lower pole right kidney.  Metastatic lesion in the upper pole right kidney is progressive and now peers to invade the right renal vein to the level of the confluence with the IVC.  Interval progression of omental, gastrocolic ligament, and ?mesenteric metastases.  Currently on FOLFIRI+ Zira-Bev. S/p Evaluation at [facility] opinion/reviewed the note from [facility]; and also reached out to Dr Dennison Nancy.  ? ?#Proceed with cycle #2 of FOLFIRI plus Bev.  labs today reviewed;  acceptable for treatment today.  Patient seems to be tolerating chemotherapy better.  We will continue current therapy until progression/intolerance. ? ?# Right upper quadrant pain-secondary to malignancy-s/p evaluation with [name]; continue oxycodone 5 mg up to 1-2 pills a day as needed.  STABLE. ? ?# Proteinuria: UA +100 protein protein:Creat ratio- awaiting today.  Okay to proceed with BeV.  ? ?#Weight loss: sec to malignancy; appetite improved; monitor for now.  ? ?# Mediport- functioning-STABLE> ? ?# DISPOSITION:   ?# Chemo today; pump off on D-3 ? ?# follow up in 2 weeks-  MD; labs- cbc/cmp CEA ;urine protein creatinine ratio;  FOLFIRI +Zira-Bev; pump off D-3;-Dr.B ? ?# I reviewed the blood work- with the patient in detail; also reviewed the imaging independently [as summarized above]; and with the patient in detail.  ? ? ? ?

## 2022-02-23 NOTE — Progress Notes (Signed)
Patient denies new problems/concerns today.   °

## 2022-02-24 LAB — CEA: CEA: 490 ng/mL — ABNORMAL HIGH (ref 0.0–4.7)

## 2022-02-25 ENCOUNTER — Inpatient Hospital Stay: Payer: Medicare HMO

## 2022-02-25 ENCOUNTER — Other Ambulatory Visit: Payer: Self-pay

## 2022-02-25 VITALS — BP 128/82 | HR 60 | Temp 97.9°F | Resp 18

## 2022-02-25 DIAGNOSIS — C182 Malignant neoplasm of ascending colon: Secondary | ICD-10-CM

## 2022-02-25 DIAGNOSIS — Z5111 Encounter for antineoplastic chemotherapy: Secondary | ICD-10-CM | POA: Diagnosis not present

## 2022-02-25 DIAGNOSIS — C787 Secondary malignant neoplasm of liver and intrahepatic bile duct: Secondary | ICD-10-CM | POA: Diagnosis not present

## 2022-02-25 DIAGNOSIS — Z452 Encounter for adjustment and management of vascular access device: Secondary | ICD-10-CM | POA: Diagnosis not present

## 2022-02-25 DIAGNOSIS — Z5112 Encounter for antineoplastic immunotherapy: Secondary | ICD-10-CM | POA: Diagnosis not present

## 2022-02-25 DIAGNOSIS — Z7189 Other specified counseling: Secondary | ICD-10-CM

## 2022-02-25 MED ORDER — SODIUM CHLORIDE 0.9% FLUSH
10.0000 mL | INTRAVENOUS | Status: DC | PRN
  Administered 2022-02-25: 10 mL
  Filled 2022-02-25: qty 10

## 2022-02-25 MED ORDER — PROCHLORPERAZINE MALEATE 10 MG PO TABS: 10.0000 mg | ORAL_TABLET | Freq: Four times a day (QID) | ORAL | 0 refills | Status: DC | PRN

## 2022-02-25 MED ORDER — ONDANSETRON HCL 8 MG PO TABS: 8.0000 mg | ORAL_TABLET | Freq: Three times a day (TID) | ORAL | 0 refills | Status: DC | PRN

## 2022-02-25 MED ORDER — HEPARIN SOD (PORK) LOCK FLUSH 100 UNIT/ML IV SOLN
500.0000 [IU] | Freq: Once | INTRAVENOUS | Status: AC | PRN
  Administered 2022-02-25: 500 [IU]
  Filled 2022-02-25: qty 5

## 2022-02-25 NOTE — Progress Notes (Signed)
Patient c/o nausea more so than usual. Patient reports he has been taking Ondansetron at home with relief but reports nausea reappears. Denies any vomiting. Patient states he has not attempted at home Compazine RX. Dr. Rogue Bussing made aware. Per Dr. Rogue Bussing, patient is to try Compazine at home since patient denies desire to have antiemetics in clinic today. Patient educated on medications and symptoms management clinic should he not receive any relief. Patient verbalizes understanding and denies any further questions or concerns.  ? ?Patients daughter states bot Compazine and Ondansetron RX is out of date. Requesting new RX. MD and team made aware to send RX to [facility] Dr.  ?

## 2022-02-26 DIAGNOSIS — C787 Secondary malignant neoplasm of liver and intrahepatic bile duct: Secondary | ICD-10-CM | POA: Diagnosis not present

## 2022-02-26 DIAGNOSIS — C189 Malignant neoplasm of colon, unspecified: Secondary | ICD-10-CM | POA: Diagnosis not present

## 2022-03-03 ENCOUNTER — Other Ambulatory Visit: Payer: Self-pay | Admitting: *Deleted

## 2022-03-03 MED ORDER — OXYCODONE HCL 5 MG PO TABS: 5.0000 mg | ORAL_TABLET | Freq: Four times a day (QID) | ORAL | 0 refills | Status: DC | PRN

## 2022-03-06 MED FILL — Dexamethasone Sodium Phosphate Inj 100 MG/10ML: INTRAMUSCULAR | Qty: 1 | Status: AC

## 2022-03-09 ENCOUNTER — Inpatient Hospital Stay: Payer: Medicare HMO | Attending: Internal Medicine | Admitting: Internal Medicine

## 2022-03-09 ENCOUNTER — Inpatient Hospital Stay (HOSPITAL_BASED_OUTPATIENT_CLINIC_OR_DEPARTMENT_OTHER): Payer: Medicare HMO

## 2022-03-09 ENCOUNTER — Other Ambulatory Visit: Payer: Self-pay

## 2022-03-09 ENCOUNTER — Encounter: Payer: Self-pay | Admitting: Internal Medicine

## 2022-03-09 ENCOUNTER — Inpatient Hospital Stay: Payer: Medicare HMO

## 2022-03-09 DIAGNOSIS — Z5111 Encounter for antineoplastic chemotherapy: Secondary | ICD-10-CM | POA: Diagnosis not present

## 2022-03-09 DIAGNOSIS — C182 Malignant neoplasm of ascending colon: Secondary | ICD-10-CM | POA: Diagnosis not present

## 2022-03-09 DIAGNOSIS — Z5112 Encounter for antineoplastic immunotherapy: Secondary | ICD-10-CM | POA: Insufficient documentation

## 2022-03-09 DIAGNOSIS — C787 Secondary malignant neoplasm of liver and intrahepatic bile duct: Secondary | ICD-10-CM | POA: Insufficient documentation

## 2022-03-09 DIAGNOSIS — Z7189 Other specified counseling: Secondary | ICD-10-CM

## 2022-03-09 LAB — CBC WITH DIFFERENTIAL/PLATELET
Abs Immature Granulocytes: 0.01 10*3/uL (ref 0.00–0.07)
Basophils Absolute: 0.1 10*3/uL (ref 0.0–0.1)
Basophils Relative: 2 %
Eosinophils Absolute: 0.3 10*3/uL (ref 0.0–0.5)
Eosinophils Relative: 8 %
HCT: 33.3 % — ABNORMAL LOW (ref 39.0–52.0)
Hemoglobin: 10.7 g/dL — ABNORMAL LOW (ref 13.0–17.0)
Immature Granulocytes: 0 %
Lymphocytes Relative: 21 %
Lymphs Abs: 0.7 10*3/uL (ref 0.7–4.0)
MCH: 31.5 pg (ref 26.0–34.0)
MCHC: 32.1 g/dL (ref 30.0–36.0)
MCV: 97.9 fL (ref 80.0–100.0)
Monocytes Absolute: 0.4 10*3/uL (ref 0.1–1.0)
Monocytes Relative: 12 %
Neutro Abs: 2.1 10*3/uL (ref 1.7–7.7)
Neutrophils Relative %: 57 %
Platelets: 173 10*3/uL (ref 150–400)
RBC: 3.4 MIL/uL — ABNORMAL LOW (ref 4.22–5.81)
RDW: 17.2 % — ABNORMAL HIGH (ref 11.5–15.5)
WBC: 3.6 10*3/uL — ABNORMAL LOW (ref 4.0–10.5)
nRBC: 0 % (ref 0.0–0.2)

## 2022-03-09 LAB — COMPREHENSIVE METABOLIC PANEL
ALT: 20 U/L (ref 0–44)
AST: 37 U/L (ref 15–41)
Albumin: 2.4 g/dL — ABNORMAL LOW (ref 3.5–5.0)
Alkaline Phosphatase: 176 U/L — ABNORMAL HIGH (ref 38–126)
Anion gap: 5 (ref 5–15)
BUN: 10 mg/dL (ref 8–23)
CO2: 25 mmol/L (ref 22–32)
Calcium: 7.8 mg/dL — ABNORMAL LOW (ref 8.9–10.3)
Chloride: 107 mmol/L (ref 98–111)
Creatinine, Ser: 0.7 mg/dL (ref 0.61–1.24)
GFR, Estimated: >60 mL/min (ref >60)
Glucose, Bld: 156 mg/dL — ABNORMAL HIGH (ref 70–99)
Potassium: 3.8 mmol/L (ref 3.5–5.1)
Sodium: 137 mmol/L (ref 135–145)
Total Bilirubin: 0.6 mg/dL (ref 0.3–1.2)
Total Protein: 7.3 g/dL (ref 6.5–8.1)

## 2022-03-09 LAB — PROTEIN / CREATININE RATIO, URINE
Creatinine, Urine: 178 mg/dL
Protein Creatinine Ratio: 0.09 mg/mg{Cre} (ref 0.00–0.15)
Total Protein, Urine: 16 mg/dL

## 2022-03-09 MED ORDER — SODIUM CHLORIDE 0.9% FLUSH
10.0000 mL | Freq: Once | INTRAVENOUS | Status: DC
  Filled 2022-03-09: qty 10

## 2022-03-09 MED ORDER — DIPHENOXYLATE-ATROPINE 2.5-0.025 MG PO TABS
1.0000 | ORAL_TABLET | Freq: Once | ORAL | Status: AC
  Administered 2022-03-09: 1 via ORAL
  Filled 2022-03-09: qty 1

## 2022-03-09 MED ORDER — SODIUM CHLORIDE 0.9 % IV SOLN
10.0000 mg | Freq: Once | INTRAVENOUS | Status: AC
  Administered 2022-03-09: 10 mg via INTRAVENOUS
  Filled 2022-03-09: qty 10

## 2022-03-09 MED ORDER — SODIUM CHLORIDE 0.9 % IV SOLN
5.0000 mg/kg | Freq: Once | INTRAVENOUS | Status: AC
  Administered 2022-03-09: 350 mg via INTRAVENOUS
  Filled 2022-03-09: qty 14

## 2022-03-09 MED ORDER — SODIUM CHLORIDE 0.9 % IV SOLN
Freq: Once | INTRAVENOUS | Status: AC
  Filled 2022-03-09: qty 250

## 2022-03-09 MED ORDER — SODIUM CHLORIDE 0.9 % IV SOLN
2400.0000 mg/m2 | INTRAVENOUS | Status: DC
  Administered 2022-03-09: 4500 mg via INTRAVENOUS
  Filled 2022-03-09: qty 90

## 2022-03-09 MED ORDER — HEPARIN SOD (PORK) LOCK FLUSH 100 UNIT/ML IV SOLN
500.0000 [IU] | Freq: Once | INTRAVENOUS | Status: DC
  Filled 2022-03-09: qty 5

## 2022-03-09 MED ORDER — PALONOSETRON HCL INJECTION 0.25 MG/5ML
0.2500 mg | Freq: Once | INTRAVENOUS | Status: AC
  Administered 2022-03-09: 0.25 mg via INTRAVENOUS
  Filled 2022-03-09: qty 5

## 2022-03-09 MED ORDER — SODIUM CHLORIDE 0.9 % IV SOLN
180.0000 mg/m2 | Freq: Once | INTRAVENOUS | Status: AC
  Administered 2022-03-09: 340 mg via INTRAVENOUS
  Filled 2022-03-09: qty 15

## 2022-03-09 NOTE — Patient Instructions (Signed)
[facility] [place]  Discharge Instructions: Thank you for choosing [facility] to provide your oncology and hematology care.  If you have a lab appointment with the [facility], please go directly to the [facility] and check in at the registration area.  Wear comfortable clothing and clothing appropriate for easy access to any Portacath or PICC line.   We strive to give you quality time with your provider. You may need to reschedule your appointment if you arrive late (15 or more minutes).  Arriving late affects you and other patients whose appointments are after yours.  Also, if you miss three or more appointments without notifying the office, you may be dismissed from the clinic at the provider's discretion.      For prescription refill requests, have your pharmacy contact our office and allow 72 hours for refills to be completed.    Today you received the following chemotherapy and/or immunotherapy agents Zirabev, Irinotecan, & 5FU      To help prevent nausea and vomiting after your treatment, we encourage you to take your nausea medication as directed.  BELOW ARE SYMPTOMS THAT SHOULD BE REPORTED IMMEDIATELY: *FEVER GREATER THAN 100.4 F (38 C) OR HIGHER *CHILLS OR SWEATING *NAUSEA AND VOMITING THAT IS NOT CONTROLLED WITH YOUR NAUSEA MEDICATION *UNUSUAL SHORTNESS OF BREATH *UNUSUAL BRUISING OR BLEEDING *URINARY PROBLEMS (pain or burning when urinating, or frequent urination) *BOWEL PROBLEMS (unusual diarrhea, constipation, pain near the anus) TENDERNESS IN MOUTH AND THROAT WITH OR WITHOUT PRESENCE OF ULCERS (sore throat, sores in mouth, or a toothache) UNUSUAL RASH, SWELLING OR PAIN  UNUSUAL VAGINAL DISCHARGE OR ITCHING   Items with * indicate a potential emergency and should be followed up as soon as possible or go to the Emergency Department if any problems should occur.  Please show the CHEMOTHERAPY ALERT CARD or IMMUNOTHERAPY ALERT CARD  at check-in to the Emergency Department and triage nurse.  Should you have questions after your visit or need to cancel or reschedule your appointment, please contact [facility] [place] [facility] [place]  [phone] and follow the prompts.  Office hours are 8:00 a.m. to 4:30 p.m. Monday - Friday. Please note that voicemails left after 4:00 p.m. may not be returned until the following business day.  We are closed weekends and major holidays. You have access to a nurse at all times for urgent questions. Please call the main number to the clinic [phone] and follow the prompts.  For any non-urgent questions, you may also contact your provider using MyChart. We now offer e-Visits for anyone 63 and older to request care online for non-urgent symptoms. For details visit [email or web address].   Also download the MyChart app! Go to the app store, search "MyChart", open the app, select [facility], and log in with your MyChart username and password.  Due to Covid, a mask is required upon entering the hospital/clinic. If you do not have a mask, one will be given to you upon arrival. For doctor visits, patients may have 1 support person aged 53 or older with them. For treatment visits, patients cannot have anyone with them due to current Covid guidelines and our immunocompromised population.

## 2022-03-09 NOTE — Assessment & Plan Note (Addendum)
#  Right-sided colon adenocarcinoma-with synchronous metastasis to liver/unresectable;  CT scan April 2023- Interval progression of metastatic disease in the chest, abdomen,and pelvis ; Interval development of numerous new bilateral pulmonary nodules with progression of pre-existing pulmonary nodules. Interval progression of mediastinal and hilar lymphadenopathy;Interval development of multiple new liver metastases since the previous CT scan.  Interval progression of the right adrenal metastatic lesion with progression of the amorphous mass just inferior to the anastomosis and just anterior to the lower pole right kidney.  Metastatic lesion in the upper pole right kidney is progressive and now peers to invade the right renal vein to the level of the confluence with the IVC.  Interval progression of omental, gastrocolic ligament, and mesenteric metastases.  Currently on FOLFIRI+ Zira-Bev. S/p Evaluation at [facility] opinion/reviewed the note from [facility]; and also discussed with Dr Dennison Nancy, who agrees with current treatment plan.   #Proceed with cycle #3 of FOLFIRI plus Bev.  labs today reviewed;  acceptable for treatment today. CEA trending down. Will plan to scan after 4-6 cycles of chemo.   # Nausea/vomitting- sec to chemo G-1; zofran prn.   # Right upper quadrant pain-secondary to malignancy-s/p evaluation with [name]; continue oxycodone 5 mg up to 1-2 pills a day as needed.  STABLE.  # Proteinuria: UA +100 protein protein:Creat ratio- WNL;  Okay to proceed with BeV.   #Weight loss: sec to malignancy; appetite improved; monitor for now; STABLE.   # Mediport- functioning-STABLE>  # DISPOSITION:   # Chemo today; pump off on D-3  # follow up in 2 weeks-  MD; labs- cbc/cmp CEA ;urine protein creatinine ratio;  FOLFIRI +Zira-Bev; pump off D-3;-Dr.B

## 2022-03-09 NOTE — Progress Notes (Signed)
Per Dr. Rogue Bussing, okay to use 02/23/22 Urine Protein of 41 due to today's urine protein still pending.   Calcium 7.8, Dr. Rogue Bussing made aware. Nothing needed at this time.

## 2022-03-09 NOTE — Progress Notes (Signed)
[facility] NOTE  Patient Care Team: Sofie Hartigan, MD as PCP - General (Family Medicine) Cammie Sickle, MD as Consulting Physician (Hematology and Oncology)  CHIEF COMPLAINTS/PURPOSE OF CONSULTATION: Colon cancer  #  Oncology History Overview Note  # MAY 2020- 3. 03/09/19 Liver biopsy. Microscopic examination shows malignant cells with glandular architecture consistent with adenocarcinoma. The malignant cells are positive for CK20 and CDX-2. These findings support the clinical impression of metastatic colon adenocarcinoma. 4. 03/10/19 R hemicolectomy. Tumor site cecum. Adenocarcinoma. Mucinous features present. G2. No tumor deposits. Invades visceral peritoneum. No tumor perforation. LVI present. PNI not identified. All margins uninvolved. 1/12 LNs. PT4apN1. Periappendiceal inflammation c/w resolving abscess. Microsatellite stable (MSS). [Dr.Mettu; [facility]]  # SEP 4th 2020 [compared to May 2020]  Interval increase in size of the metastases to the hepatic dome, The metastasis to the left hepatic lobe is unchanged; 2.  New subcentimeter hypoattenuating lesion in the inferior right hepatic lobe, incompletely characterized on CT. 3.  Postsurgical changes following right hemicolectomy.  # OCT 2020- FOLFOX +avastin; CT dec 22nd 2020- [compared to [facility] sep 9th 2020]-Liver- slight progression versus stable disease; CT scan SEP 4th 2021- Progressive disease-left lower lobe lung nodule 8 mm [previously 4 mm]; increase in size of the hepatic metastases by few millimeters.;  Soft tissue nodule adjacent to anastomotic site again increased by few millimeters.STOP FOLFOX; cont avastin  #  SEP 20th, 2021- FOLFIRI+ AVASTIN; HOLD FEB 2022- sec to poor tolerance [peptic ulcer]; 3/30-liver ablation-   [bland embolization of segment 2/3 liver lesion on 12/19/2020;  microwave ablation of both liver lesions.  # ?July 25th, 2022- BEV _ TAS 102.MAY 2022- s/p Liver ablation  # DEC  2022-progressive disease-lung and liver/abdomen; JAN 31st 2023-START Regorefenib.   # FEB /16-EGD [Dr.Anna-duodenal ulcer-? meloxicam]  # JAN 2022- COVID [s/p Mab infusion; pills; skin rash resolved.]  # NGS/F-ONE-MUTATED K-RAS [G]       Cancer of right colon ([place])  07/05/2019 Initial Diagnosis   Cancer of right colon ([place])   07/24/2019 - 06/25/2020 Chemotherapy   The patient had dexamethasone (DECADRON) 4 MG tablet, 8 mg, Oral, Daily, 1 of 1 cycle, Start date: --, End date: -- palonosetron (ALOXI) injection 0.25 mg, 0.25 mg, Intravenous,  Once, 23 of 25 cycles Administration: 0.25 mg (07/24/2019), 0.25 mg (08/07/2019), 0.25 mg (08/21/2019), 0.25 mg (09/04/2019), 0.25 mg (09/20/2019), 0.25 mg (10/04/2019), 0.25 mg (10/16/2019), 0.25 mg (10/30/2019), 0.25 mg (11/22/2019), 0.25 mg (12/06/2019), 0.25 mg (12/20/2019), 0.25 mg (01/03/2020), 0.25 mg (01/29/2020), 0.25 mg (02/12/2020), 0.25 mg (02/26/2020), 0.25 mg (03/11/2020), 0.25 mg (03/25/2020), 0.25 mg (04/08/2020), 0.25 mg (04/24/2020), 0.25 mg (05/08/2020), 0.25 mg (05/22/2020), 0.25 mg (06/10/2020), 0.25 mg (06/25/2020) leucovorin 800 mg in dextrose 5 % 250 mL infusion, 844 mg, Intravenous,  Once, 23 of 25 cycles Administration: 800 mg (07/24/2019), 800 mg (08/07/2019), 800 mg (08/21/2019), 800 mg (09/04/2019), 800 mg (09/20/2019), 800 mg (10/04/2019), 800 mg (10/16/2019), 800 mg (10/30/2019), 800 mg (11/22/2019), 800 mg (12/06/2019), 800 mg (12/20/2019), 800 mg (01/03/2020), 800 mg (01/29/2020), 800 mg (02/12/2020), 800 mg (02/26/2020), 800 mg (03/11/2020), 800 mg (03/25/2020), 800 mg (04/08/2020), 800 mg (04/24/2020), 800 mg (05/08/2020), 800 mg (05/22/2020), 800 mg (06/10/2020), 800 mg (06/25/2020) oxaliplatin (ELOXATIN) 180 mg in dextrose 5 % 500 mL chemo infusion, 85 mg/m2 = 180 mg, Intravenous,  Once, 23 of 25 cycles Dose modification: 178 mg (original dose 85 mg/m2, Cycle 17, Reason: Other (see comments), Comment: insurance adjusted dose ) Administration: 180 mg (07/24/2019), 180  mg  (08/07/2019), 180 mg (08/21/2019), 180 mg (09/04/2019), 180 mg (09/20/2019), 180 mg (10/04/2019), 180 mg (10/16/2019), 180 mg (10/30/2019), 180 mg (11/22/2019), 180 mg (12/06/2019), 180 mg (12/20/2019), 180 mg (01/03/2020), 180 mg (01/29/2020), 180 mg (02/12/2020), 180 mg (02/26/2020), 180 mg (03/11/2020), 180 mg (04/08/2020), 180 mg (04/24/2020), 180 mg (05/08/2020), 180 mg (05/22/2020), 180 mg (06/10/2020), 180 mg (06/25/2020) fluorouracil (ADRUCIL) 5,000 mg in sodium chloride 0.9 % 150 mL chemo infusion, 5,050 mg, Intravenous, 1 Day/Dose, 23 of 25 cycles Administration: 5,000 mg (07/24/2019), 5,000 mg (08/07/2019), 5,000 mg (08/21/2019), 5,050 mg (09/04/2019), 5,000 mg (10/04/2019), 5,000 mg (10/16/2019), 5,000 mg (11/22/2019), 5,000 mg (12/06/2019), 5,000 mg (12/20/2019), 5,000 mg (01/03/2020), 5,000 mg (01/29/2020), 5,000 mg (02/12/2020), 5,000 mg (02/26/2020), 5,000 mg (03/11/2020), 5,000 mg (03/25/2020), 5,000 mg (04/08/2020), 5,000 mg (04/24/2020), 5,000 mg (05/08/2020), 5,000 mg (05/22/2020), 5,000 mg (06/10/2020), 5,000 mg (06/25/2020) bevacizumab-bvzr (ZIRABEV) 400 mg in sodium chloride 0.9 % 100 mL chemo infusion, 5 mg/kg = 400 mg, Intravenous,  Once, 23 of 25 cycles Administration: 400 mg (08/07/2019), 400 mg (08/21/2019), 400 mg (09/04/2019), 400 mg (09/20/2019), 400 mg (10/04/2019), 400 mg (10/16/2019), 400 mg (10/30/2019), 400 mg (11/22/2019), 400 mg (12/06/2019), 400 mg (12/20/2019), 400 mg (01/03/2020), 400 mg (01/29/2020), 400 mg (02/12/2020), 400 mg (02/26/2020), 400 mg (03/11/2020), 400 mg (03/25/2020), 400 mg (04/08/2020), 400 mg (04/24/2020), 400 mg (05/08/2020), 400 mg (05/22/2020), 400 mg (06/10/2020), 400 mg (06/25/2020)   for chemotherapy treatment.     07/08/2020 -  Chemotherapy   Patient is on Treatment Plan : COLORECTAL FOLFIRI / BEVACIZUMAB Q14D      11/05/2020 Cancer Staging   Staging form: Colon and Rectum, AJCC 8th Edition - Clinical: Stage IVC (pM1c) - Signed by Cammie Sickle, MD on 11/05/2020     HISTORY OF PRESENTING  ILLNESS: He is alone.  Ambulating independently.  Caro Hight 81 y.o.  male with metastatic colon cancer to the liver-s/p multiple lines of therapy -currently on FOLFIRI + Bev is here follow-up.  Patient is currently s/p chemotherapy cycle #2.  Patient had nausea vomiting x1 day; resolved.    Denies any fevers or chills. Continues to have abdominal pain for which he has been taking oxycodone 5 mg up to 1-2 pill a day.  Denies any significant diarrhea from chemotherapy. No sores in the mouth.  No rash on palms and soles.  Weight is stable.   However claims to have good appetite.  Review of Systems  Constitutional:  Positive for malaise/fatigue. Negative for chills, diaphoresis and fever.  HENT:  Negative for nosebleeds and sore throat.   Eyes:  Negative for double vision.  Respiratory:  Negative for cough, hemoptysis, sputum production, shortness of breath and wheezing.   Cardiovascular:  Negative for chest pain, palpitations, orthopnea and leg swelling.  Gastrointestinal:  Negative for blood in stool, constipation, heartburn, melena, nausea and vomiting.  Genitourinary:  Negative for dysuria, frequency and urgency.  Musculoskeletal:  Positive for back pain and joint pain.  Skin: Negative.  Negative for itching and rash.  Neurological:  Positive for tingling. Negative for focal weakness, weakness and headaches.  Endo/Heme/Allergies:  Does not bruise/bleed easily.  Psychiatric/Behavioral:  Negative for depression. The patient is not nervous/anxious and does not have insomnia.     MEDICAL HISTORY:  Past Medical History:  Diagnosis Date  . Arthritis   . Cancer ([place])    colon cancer 02/2019 per pt   . Family history of adverse reaction to anesthesia    cousin took all night  to wake up from anesthesia  . H/O colon cancer, stage IV   . Hyperlipemia   . Neuromuscular disorder ([place])   . Pre-diabetes     SURGICAL HISTORY: Past Surgical History:  Procedure Laterality Date  . COLON  SURGERY    . ESOPHAGOGASTRODUODENOSCOPY (EGD) WITH PROPOFOL N/A 12/26/2020   Procedure: ESOPHAGOGASTRODUODENOSCOPY (EGD) WITH PROPOFOL;  Surgeon: [name], MD;  Location: [facility] ENDOSCOPY;  Service: Gastroenterology;  Laterality: N/A;  . IR ANGIOGRAM SELECTIVE EACH ADDITIONAL VESSEL  12/19/2020  . IR ANGIOGRAM SELECTIVE EACH ADDITIONAL VESSEL  03/24/2021  . IR ANGIOGRAM SELECTIVE EACH ADDITIONAL VESSEL  03/24/2021  . IR ANGIOGRAM SELECTIVE EACH ADDITIONAL VESSEL  04/04/2021  . IR ANGIOGRAM SELECTIVE EACH ADDITIONAL VESSEL  04/04/2021  . IR ANGIOGRAM SELECTIVE EACH ADDITIONAL VESSEL  04/04/2021  . IR ANGIOGRAM VISCERAL SELECTIVE  12/19/2020  . IR ANGIOGRAM VISCERAL SELECTIVE  03/24/2021  . IR ANGIOGRAM VISCERAL SELECTIVE  04/04/2021  . IR ANGIOGRAM VISCERAL SELECTIVE  04/04/2021  . IR EMBO ARTERIAL NOT HEMORR HEMANG INC GUIDE ROADMAPPING  03/24/2021  . IR EMBO TUMOR ORGAN ISCHEMIA INFARCT INC GUIDE ROADMAPPING  12/19/2020  . IR EMBO TUMOR ORGAN ISCHEMIA INFARCT INC GUIDE ROADMAPPING  04/04/2021  . IR IMAGING GUIDED PORT INSERTION  07/20/2019  . IR RADIOLOGIST EVAL & MGMT  12/05/2020  . IR RADIOLOGIST EVAL & MGMT  02/11/2021  . IR RADIOLOGIST EVAL & MGMT  03/04/2021  . IR RADIOLOGIST EVAL & MGMT  04/29/2021  . IR RADIOLOGIST EVAL & MGMT  07/22/2021  . IR RADIOLOGIST EVAL & MGMT  11/19/2021  . IR US GUIDE VASC ACCESS RIGHT  12/19/2020  . IR US GUIDE VASC ACCESS RIGHT  03/24/2021  . IR US GUIDE VASC ACCESS RIGHT  04/04/2021  . JOINT REPLACEMENT Left 2010   Knee  . RADIOLOGY WITH ANESTHESIA N/A 01/15/2021   Procedure: CT WITH ANESTHESIA MICROWAVE ABLATION OF LIVER;  Surgeon: Criselda Peaches, MD;  Location: [facility] ORS;  Service: Anesthesiology;  Laterality: N/A;    SOCIAL HISTORY: Social History   Socioeconomic History  . Marital status: Married    Spouse name: Not on file  . Number of children: Not on file  . Years of education: Not on file  . Highest education level: Not on file  Occupational History  . Not on  file  Tobacco Use  . Smoking status: Every Day    Packs/day: 0.25    Years: 15.00    Pack years: 3.75    Types: Cigarettes  . Smokeless tobacco: Never  . Tobacco comments:    1 to 2 cigarettes a day occasionally  Vaping Use  . Vaping Use: Never used  Substance and Sexual Activity  . Alcohol use: Yes    Comment: rarely   . Drug use: No  . Sexual activity: Not on file  Other Topics Concern  . Not on file  Social History Narrative   Recruitment consultant retd; lives in [place]; smoking 3cig/day; [3/4 ppd x started at 9 years]; no alcohol. Son & daughter; wife dementia [waiting for placement].    Social Determinants of Health   Financial Resource Strain: Not on file  Food Insecurity: Not on file  Transportation Needs: Not on file  Physical Activity: Not on file  Stress: Not on file  Social Connections: Not on file  Intimate Partner Violence: Not on file    FAMILY HISTORY: Family History  Problem Relation Age of Onset  . Peptic Ulcer Disease Father     ALLERGIES:  is allergic to ace inhibitors.  MEDICATIONS:  Current Outpatient Medications  Medication Sig Dispense Refill  . oxyCODONE (OXY IR/ROXICODONE) 5 MG immediate release tablet Take 1 tablet (5 mg total) by mouth every 6 (six) hours as needed for severe pain. 60 tablet 0  . tamsulosin (FLOMAX) 0.4 MG CAPS capsule Take 1 capsule (0.4 mg total) by mouth daily. 90 capsule 1  . amLODipine (NORVASC) 5 MG tablet Take 5 mg by mouth daily. (Patient not taking: Reported on 10/03/2021)    . diphenoxylate-atropine (LOMOTIL) 2.5-0.025 MG tablet Take 1 tablet by mouth 4 (four) times daily as needed for diarrhea or loose stools. Take it along with immodium (Patient not taking: Reported on 02/23/2022) 60 tablet 0  . ondansetron (ZOFRAN) 8 MG tablet Take 1 tablet (8 mg total) by mouth every 8 (eight) hours as needed for nausea or vomiting. (Patient not taking: Reported on 03/09/2022) 30 tablet 0  . polyethylene glycol powder  (GLYCOLAX/MIRALAX) 17 GM/SCOOP powder Take by mouth as needed. (Patient not taking: Reported on 12/29/2021)    . pravastatin (PRAVACHOL) 40 MG tablet Take 40 mg by mouth daily.  (Patient not taking: Reported on 06/09/2021)    . predniSONE (DELTASONE) 20 MG tablet Take 1 tablet (20 mg total) by mouth daily with breakfast. (Patient not taking: Reported on 02/06/2022) 30 tablet 1  . prochlorperazine (COMPAZINE) 10 MG tablet Take 1 tablet (10 mg total) by mouth every 6 (six) hours as needed for nausea or vomiting. (Patient not taking: Reported on 03/09/2022) 60 tablet 0  . regorafenib (STIVARGA) 40 MG tablet Take 2 tablets (80 mg total) by mouth daily with breakfast. Take for 21 days, then hold for 7 days. Repeat every 28 days. (Patient not taking: Reported on 02/06/2022) 42 tablet 1  . senna (SENOKOT) 8.6 MG TABS tablet Take 1 tablet (8.6 mg total) by mouth daily. (Patient not taking: Reported on 12/29/2021) 120 tablet 0  . sucralfate (CARAFATE) 1 g tablet Take 1 tablet (1 g total) by mouth 4 (four) times daily -  with meals and at bedtime. (Patient not taking: Reported on 11/18/2021) 90 tablet 3   No current facility-administered medications for this visit.   Facility-Administered Medications Ordered in Other Visits  Medication Dose Route Frequency Provider Last Rate Last Admin  . bevacizumab-bvzr (ZIRABEV) 350 mg in sodium chloride 0.9 % 100 mL chemo infusion  5 mg/kg (Order-Specific) Intravenous Once Charlaine Dalton R, MD      . dexamethasone (DECADRON) 10 mg in sodium chloride 0.9 % 50 mL IVPB  10 mg Intravenous Once Charlaine Dalton R, MD      . fluorouracil (ADRUCIL) 4,500 mg in sodium chloride 0.9 % 60 mL chemo infusion  2,400 mg/m2 (Order-Specific) Intravenous 1 day or 1 dose Charlaine Dalton R, MD      . heparin lock flush 100 unit/mL  500 Units Intravenous Once Charlaine Dalton R, MD      . irinotecan (CAMPTOSAR) 340 mg in sodium chloride 0.9 % 500 mL chemo infusion  180 mg/m2  (Order-Specific) Intravenous Once Charlaine Dalton R, MD      . sodium chloride flush (NS) 0.9 % injection 10 mL  10 mL Intravenous Once Cammie Sickle, MD          .  PHYSICAL EXAMINATION: ECOG PERFORMANCE STATUS: 0 - Asymptomatic  Vitals:   03/09/22 0943  BP: 122/71  Pulse: 75  Temp: 97.8 F (36.6 C)    Filed Weights   03/09/22 0943  Weight: 153  lb (69.4 kg)     Physical Exam HENT:     Head: Normocephalic and atraumatic.     Mouth/Throat:     Pharynx: No oropharyngeal exudate.  Eyes:     Pupils: Pupils are equal, round, and reactive to light.  Cardiovascular:     Rate and Rhythm: Normal rate and regular rhythm.  Pulmonary:     Effort: No respiratory distress.     Breath sounds: No wheezing.     Comments: Decreased breath sounds bilaterally.  No wheeze or crackles Abdominal:     General: Bowel sounds are normal. There is no distension.     Palpations: Abdomen is soft. There is no mass.     Tenderness: There is no abdominal tenderness. There is no guarding or rebound.  Musculoskeletal:        General: No tenderness. Normal range of motion.     Cervical back: Normal range of motion and neck supple.  Skin:    General: Skin is warm.  Neurological:     Mental Status: He is alert and oriented to person, place, and time.  Psychiatric:        Mood and Affect: Affect normal.   LABORATORY DATA:  I have reviewed the data as listed Lab Results  Component Value Date   WBC 3.6 (L) 03/09/2022   HGB 10.7 (L) 03/09/2022   HCT 33.3 (L) 03/09/2022   MCV 97.9 03/09/2022   PLT 173 03/09/2022   Recent Labs    02/06/22 0833 02/23/22 0835 03/09/22 0916  NA 136 137 137  K 3.6 3.8 3.8  CL 102 104 107  CO2 $Re'26 26 25  'Rmf$ GLUCOSE 186* 178* 156*  BUN $Re'9 10 10  'xNF$ CREATININE 1.05 0.91 0.70  CALCIUM 8.2* 8.2* 7.8*  GFRNONAA >60 >60 >60  PROT 8.6* 7.7 7.3  ALBUMIN 2.3* 2.3* 2.4*  AST 39 43* 37  ALT $Re'16 21 20  'Fwp$ ALKPHOS 195* 198* 176*  BILITOT 1.0 0.7 0.6     RADIOGRAPHIC STUDIES: I have personally reviewed the radiological images as listed and agreed with the findings in the report. CT CHEST ABDOMEN PELVIS W CONTRAST  Result Date: 02/09/2022 CLINICAL DATA:  Colon cancer.  Restaging.  * Tracking Code: BO * EXAM: CT CHEST, ABDOMEN, AND PELVIS WITH CONTRAST TECHNIQUE: Multidetector CT imaging of the chest, abdomen and pelvis was performed following the standard protocol during bolus administration of intravenous contrast. RADIATION DOSE REDUCTION: This exam was performed according to the departmental dose-optimization program which includes automated exposure control, adjustment of the mA and/or kV according to patient size and/or use of iterative reconstruction technique. CONTRAST:  90mL OMNIPAQUE IOHEXOL 300 MG/ML  SOLN COMPARISON:  Chest and pelvis CT 10/06/2021.  Abdomen MR 11/14/2021 FINDINGS: CT CHEST FINDINGS Cardiovascular: The heart size is normal. No substantial pericardial effusion. Coronary artery calcification is evident. Mild atherosclerotic calcification is noted in the wall of the thoracic aorta. Right Port-A-Cath tip is in the distal SVC Mediastinum/Nodes: Interval progression of mediastinal and hilar lymphadenopathy. 2.4 cm right hilar lymph node on 31/3 was 1.0 cm previously (remeasured). Index left hilar node measured previously at 15 mm short axis is 18 mm short axis today on 34/3. 18 mm short axis subcarinal node on 36/3 is new in the interval. No axillary lymphadenopathy. Lungs/Pleura: Interval development of numerous new bilateral pulmonary nodules with progression of pre-existing pulmonary nodules. 11 mm right lower lobe nodule on 97/2 is new since prior. 11 mm left upper lobe nodule on 96/2 is new  since prior. 17 mm subpleural right lower lobe nodule on 70/2 was 7 mm previously. 17 mm right lower lobe nodule on 01/16/2 was 5 mm previously. 12 mm anterior right upper lobe nodule on 88/2 was 10 mm previously. Moderate left pleural effusion  again noted. Musculoskeletal: No worrisome lytic or sclerotic osseous abnormality. CT ABDOMEN PELVIS FINDINGS Hepatobiliary: Interval development of multiple new liver metastases since the previous CT scan although these were visible on the MRI better progressive since that study of 11/14/2021. 8 mm inferior right lower lobe lesion measured on the MRI previously is now 15 mm on image 70/3. Posterior right hepatic lobe lesion measured previously at 11 mm is now 20 mm on image 58/3. Previously measured lesion in the dome of the right liver at 7 mm is 15 mm today on 59/3. Dominant large lesion towards the dome is similar. 15 mm posterior right hepatic lobe lesion on 67/3 is new. Gallbladder unremarkable. Similar mild extrahepatic biliary duct dilatation (common bile duct measuring 8 mm diameter in the head of the pancreas. Pancreas: No focal mass lesion. No dilatation of the main duct. No intraparenchymal cyst. No peripancreatic edema. Spleen: No splenomegaly. No focal mass lesion. Adrenals/Urinary Tract: Interval progression of the right adrenal metastatic lesion with progression of the anterior right upper pole renal metastatic deposit measuring 4.2 x 4.4 cm today compared to 3.5 x 2.6 cm previously this amorphous mass encases the right renal artery and appears to invade the right renal vein (see image 73/3) extending to the confluence of the IVC. Small cyst lower pole left kidney again noted. No evidence for hydroureter. The urinary bladder appears normal for the degree of distention. Stomach/Bowel: Stomach is unremarkable. No gastric wall thickening. No evidence of outlet obstruction. Duodenum is normally positioned as is the ligament of Treitz. No small bowel wall thickening. No small bowel dilatation. Status post right hemicolectomy. Calcified spiculated lesion just inferior to the anastomosis and just anterior to the lower pole right kidney measures 3.5 x 4.1 cm today compared to 4.4 x 4.1 cm previously  (remeasured). Left colon unremarkable. Vascular/Lymphatic: Infrarenal abdominal aorta measures up to 3.2 cm diameter. Moderate atherosclerotic calcification associated. Necrotic lymphadenopathy is seen in the hepatoduodenal ligament. Index 2.8 cm short axis lymph node measured previously is 3.1 cm short axis on image 66/3 today. 1.9 cm short axis anterior hepatoduodenal ligament lymph node on 67/3 is new in the interval. Right para-aortic lymphadenopathy compresses the IVC and left renal vein (image 72/3). Retrocaval lymphadenopathy again noted. Borderline aortocaval lymphadenopathy on 89/3 is similar. No pelvic sidewall lymphadenopathy. Reproductive: The prostate gland and seminal vesicles are unremarkable. Other: Small volume free fluid noted in the abdomen and pelvis. 2 cm omental nodule in the hepatic flexure (80/3) was 1.1 cm previously. 2.1 cm nodule in the right paramidline gastrocolic ligament (99/3) was 1.2 cm previously. 11 mm nodule in the midline rectus sheath (87/3) is new in the interval. New nodularity/lymphadenopathy is seen in the ileocolic mesentery (image 92/3). Musculoskeletal: No worrisome lytic or sclerotic osseous abnormality. IMPRESSION: 1. Interval progression of metastatic disease in the chest, abdomen, and pelvis. 2. Interval development of numerous new bilateral pulmonary nodules with progression of pre-existing pulmonary nodules. 3. Interval progression of mediastinal and hilar lymphadenopathy. 4. Interval development of multiple new liver metastases since the previous CT scan. Although these were visible on the MRI of 11/14/2021, many of the liver lesions are progressive since that time. 5. Interval progression of the right adrenal metastatic lesion with progression of  the amorphous mass just inferior to the anastomosis and just anterior to the lower pole right kidney. 6. Metastatic lesion in the upper pole right kidney is progressive and now peers to invade the right renal vein to the  level of the confluence with the IVC. 7. Interval progression of omental, gastrocolic ligament, and mesenteric metastases. 8. Small volume free fluid in the abdomen and pelvis. 9. 3.2 cm abdominal aortic aneurysm. Attention on follow-up recommended. 10. Aortic Atherosclerosis (ICD10-I70.0). Electronically Signed   By: Misty Stanley M.D.   On: 02/09/2022 11:57     Latest Reference Range & Units Most Recent 06/09/21 08:30 06/25/21 10:24 07/07/21 09:06 07/21/21 08:32 08/04/21 08:54 08/18/21 08:31 09/01/21 08:47 09/25/21 08:29 10/28/21 10:14 11/18/21 14:20 12/08/21 09:06  CEA 0.0 - 4.7 ng/mL 301.0 (H) 12/08/21 09:06 91.6 (H) 84.1 (H) 87.6 (H) 89.1 (H) 108.0 (H) 108.0 (H) 107.0 (H) 105.0 (H) 119.0 (H) 177.0 (H) 301.0 (H)  (H): Data is abnormally high  ASSESSMENT & PLAN:   Cancer of right colon ([facility]) #Right-sided colon adenocarcinoma-with synchronous metastasis to liver/unresectable;  CT scan April 2023- Interval progression of metastatic disease in the chest, abdomen,and pelvis ; Interval development of numerous new bilateral pulmonary nodules with progression of pre-existing pulmonary nodules. Interval progression of mediastinal and hilar lymphadenopathy;Interval development of multiple new liver metastases since the previous CT scan.  Interval progression of the right adrenal metastatic lesion with progression of the amorphous mass just inferior to the anastomosis and just anterior to the lower pole right kidney.  Metastatic lesion in the upper pole right kidney is progressive and now peers to invade the right renal vein to the level of the confluence with the IVC.  Interval progression of omental, gastrocolic ligament, and mesenteric metastases.  Currently on FOLFIRI+ Zira-Bev. S/p Evaluation at [facility] II opinion/reviewed the [facility] from [facility]; and also discussed with Dr Dennison Nancy, who agrees with current treatment plan.   #Proceed with cycle #3 of FOLFIRI plus Bev.  labs today reviewed;  acceptable for treatment  today. CEA trending down. Will plan to scan after 4-6 cycles of chemo.   # Nausea/vomitting- sec to chemo G-1; zofran prn.   # Right upper quadrant pain-secondary to malignancy-s/p evaluation with [name]; continue oxycodone 5 mg up to 1-2 pills a day as needed.  STABLE.  # Proteinuria: UA +100 protein protein:Creat ratio- WNL;  Okay to proceed with BeV.   #Weight loss: sec to malignancy; appetite improved; monitor for now; STABLE.   # Mediport- functioning-STABLE>  # DISPOSITION:   # Chemo today; pump off on D-3  # follow up in 2 weeks-  MD; labs- cbc/cmp CEA ;urine protein creatinine ratio;  FOLFIRI +Zira-Bev; pump off D-3;-Dr.B           All questions were answered. The patient knows to call the clinic with any problems, questions or concerns.    Cammie Sickle, MD 03/09/2022 10:50 AM

## 2022-03-10 ENCOUNTER — Telehealth: Payer: Self-pay

## 2022-03-10 ENCOUNTER — Telehealth: Payer: Self-pay | Admitting: Dietician

## 2022-03-10 LAB — CEA: CEA: 324 ng/mL — ABNORMAL HIGH (ref 0.0–4.7)

## 2022-03-10 NOTE — Telephone Encounter (Signed)
Per secure chat with Dr. Jacinto Reap:  Please fax patient's foundation 1 report from October 2020-with above fax number. Thanks Mettu, Baltazar Apo, MD   [address]   [place], [place] [address]   [phone] (Work)   [phone] (Fax)    Report faxed.   Fax confirmation received.

## 2022-03-10 NOTE — Telephone Encounter (Signed)
Patient screened on MST. First attempt to reach. Provided my cell# on voice mail to return call to set up a nutrition consult. ° °Cyndi [place] Oborn, RDN, LDN °Registered Dietitian, [facility] °Part Time Remote (Usual office hours: Tuesday-Thursday) °Cell: [phone]   °

## 2022-03-11 ENCOUNTER — Inpatient Hospital Stay: Payer: Medicare HMO

## 2022-03-11 VITALS — BP 142/78 | HR 70 | Resp 18

## 2022-03-11 DIAGNOSIS — Z452 Encounter for adjustment and management of vascular access device: Secondary | ICD-10-CM | POA: Diagnosis not present

## 2022-03-11 DIAGNOSIS — Z7189 Other specified counseling: Secondary | ICD-10-CM

## 2022-03-11 DIAGNOSIS — Z5112 Encounter for antineoplastic immunotherapy: Secondary | ICD-10-CM | POA: Diagnosis not present

## 2022-03-11 DIAGNOSIS — Z5111 Encounter for antineoplastic chemotherapy: Secondary | ICD-10-CM | POA: Diagnosis not present

## 2022-03-11 DIAGNOSIS — C787 Secondary malignant neoplasm of liver and intrahepatic bile duct: Secondary | ICD-10-CM | POA: Diagnosis not present

## 2022-03-11 DIAGNOSIS — C182 Malignant neoplasm of ascending colon: Secondary | ICD-10-CM

## 2022-03-11 MED ORDER — SODIUM CHLORIDE 0.9% FLUSH
10.0000 mL | INTRAVENOUS | Status: DC | PRN
  Administered 2022-03-11: 10 mL
  Filled 2022-03-11: qty 10

## 2022-03-11 MED ORDER — HEPARIN SOD (PORK) LOCK FLUSH 100 UNIT/ML IV SOLN
500.0000 [IU] | Freq: Once | INTRAVENOUS | Status: AC | PRN
  Administered 2022-03-11: 500 [IU]
  Filled 2022-03-11: qty 5

## 2022-03-12 ENCOUNTER — Telehealth: Payer: Self-pay | Admitting: Dietician

## 2022-03-12 NOTE — Telephone Encounter (Signed)
Patient screened on MST. Second attempt to reach. Left message and sent text letting him know I was scheduling him for a telephone consult next Wednesday. Provided my cell# on voice mail and office # and cell on text message.  April Manson, RDN, LDN Registered Dietitian, [facility] Part Time Remote (Usual office hours: Tuesday-Thursday) Cell: [phone]

## 2022-03-18 ENCOUNTER — Inpatient Hospital Stay: Payer: Medicare HMO | Admitting: Dietician

## 2022-03-18 NOTE — Progress Notes (Signed)
Nutrition Assessment:  Called patient on his home telephone which is a cell phone   Reason for Assessment: MST screen for weight loss.    ASSESSMENT: Patient is a 81 year old  male with metastatic colon cancer to the liver-s/p multiple lines of therapy. He is being followed by Dr. Rogue Bussing and currently on FOLFIRI + Bev every 2 weeks.  He has a PMHx that includes DM2, HTN, HLD, and duodenal ulcer.    He reports he was having some concerns with regularity of his bowels and he just didn't feel like eating so started to lose weight.  He also reports that lately he is much better and is back to eating as usual.    Breakfast:  Honey nut cheerios each morning, or hard boiled egg  Snacks...cut up fruit, melons, grapes, Cheetos  Eats out frequently for dinner: [employer]  (breakfast for dinner- likes blackberry pancakes...  ), [employer], [employer], Chik Filet or [place]  Other favorites, cheese shrimp, fried rice will eat some green beans , corn, pintos.Thressa Sheller eater.. not big on greens Fluids:  3 cups Coke, 1 cups OJ, doesn't like plain tap water    Anthropometrics:  significant weight loss 29# (15.9%) past 6 months,  within 3# fluctuation past month  Height: 72" Weight:  03/09/22  153# 02/23/22  152# 02/06/22  155# 12/29/21  160# 12/08/21  160# 09/25/21   182# UBW: 180-185# BMI: 20.75  Labs:  03/09/22  Hgb 10.7 (falling)  glucose 156  INTERVENTION: We reviewed some food groups that he doesn't usually include and his hemoglobin that has been decreasing.  Suggested he consider buying a women's MVI and take daily to increase his Vit D, Ca and iron.  Also tried to encourage increased water or zero calorie fluids to aid in continue bowel regularity.  Sent text with my contact information and asked that he reach out if he had any nutrition impact symptoms that prevents him from usual intake or weight maintenance.   MONITORING, EVALUATION, GOAL: weight trends with goal to maintain  between CBW and 160 range   Next Visit: PRN at patient or provider request  April Manson, RDN, LDN Registered Dietitian, [facility] (Usual office hours: Tuesday-Thursday) Cell: [phone]

## 2022-03-20 MED FILL — Dexamethasone Sodium Phosphate Inj 100 MG/10ML: INTRAMUSCULAR | Qty: 1 | Status: AC

## 2022-03-23 ENCOUNTER — Inpatient Hospital Stay (HOSPITAL_BASED_OUTPATIENT_CLINIC_OR_DEPARTMENT_OTHER): Payer: Medicare HMO | Admitting: Internal Medicine

## 2022-03-23 ENCOUNTER — Inpatient Hospital Stay: Payer: Medicare HMO | Attending: Internal Medicine

## 2022-03-23 ENCOUNTER — Inpatient Hospital Stay: Payer: Medicare HMO

## 2022-03-23 DIAGNOSIS — C787 Secondary malignant neoplasm of liver and intrahepatic bile duct: Secondary | ICD-10-CM | POA: Insufficient documentation

## 2022-03-23 DIAGNOSIS — Z5112 Encounter for antineoplastic immunotherapy: Secondary | ICD-10-CM | POA: Insufficient documentation

## 2022-03-23 DIAGNOSIS — C7971 Secondary malignant neoplasm of right adrenal gland: Secondary | ICD-10-CM | POA: Insufficient documentation

## 2022-03-23 DIAGNOSIS — C786 Secondary malignant neoplasm of retroperitoneum and peritoneum: Secondary | ICD-10-CM | POA: Insufficient documentation

## 2022-03-23 DIAGNOSIS — Z452 Encounter for adjustment and management of vascular access device: Secondary | ICD-10-CM | POA: Insufficient documentation

## 2022-03-23 DIAGNOSIS — Z5111 Encounter for antineoplastic chemotherapy: Secondary | ICD-10-CM | POA: Insufficient documentation

## 2022-03-23 DIAGNOSIS — C182 Malignant neoplasm of ascending colon: Secondary | ICD-10-CM | POA: Diagnosis not present

## 2022-03-23 DIAGNOSIS — C7800 Secondary malignant neoplasm of unspecified lung: Secondary | ICD-10-CM | POA: Diagnosis not present

## 2022-03-23 DIAGNOSIS — Z7189 Other specified counseling: Secondary | ICD-10-CM

## 2022-03-23 LAB — COMPREHENSIVE METABOLIC PANEL
ALT: 21 U/L (ref 0–44)
AST: 36 U/L (ref 15–41)
Albumin: 2.7 g/dL — ABNORMAL LOW (ref 3.5–5.0)
Alkaline Phosphatase: 188 U/L — ABNORMAL HIGH (ref 38–126)
Anion gap: 5 (ref 5–15)
BUN: 11 mg/dL (ref 8–23)
CO2: 27 mmol/L (ref 22–32)
Calcium: 8.2 mg/dL — ABNORMAL LOW (ref 8.9–10.3)
Chloride: 106 mmol/L (ref 98–111)
Creatinine, Ser: 0.84 mg/dL (ref 0.61–1.24)
GFR, Estimated: >60 mL/min (ref >60)
Glucose, Bld: 134 mg/dL — ABNORMAL HIGH (ref 70–99)
Potassium: 3.6 mmol/L (ref 3.5–5.1)
Sodium: 138 mmol/L (ref 135–145)
Total Bilirubin: 0.6 mg/dL (ref 0.3–1.2)
Total Protein: 7.5 g/dL (ref 6.5–8.1)

## 2022-03-23 LAB — CBC WITH DIFFERENTIAL/PLATELET
Abs Immature Granulocytes: 0.01 10*3/uL (ref 0.00–0.07)
Basophils Absolute: 0 10*3/uL (ref 0.0–0.1)
Basophils Relative: 1 %
Eosinophils Absolute: 0.4 10*3/uL (ref 0.0–0.5)
Eosinophils Relative: 8 %
HCT: 33.3 % — ABNORMAL LOW (ref 39.0–52.0)
Hemoglobin: 10.9 g/dL — ABNORMAL LOW (ref 13.0–17.0)
Immature Granulocytes: 0 %
Lymphocytes Relative: 23 %
Lymphs Abs: 1 10*3/uL (ref 0.7–4.0)
MCH: 32.2 pg (ref 26.0–34.0)
MCHC: 32.7 g/dL (ref 30.0–36.0)
MCV: 98.2 fL (ref 80.0–100.0)
Monocytes Absolute: 0.5 10*3/uL (ref 0.1–1.0)
Monocytes Relative: 12 %
Neutro Abs: 2.3 10*3/uL (ref 1.7–7.7)
Neutrophils Relative %: 56 %
Platelets: 185 10*3/uL (ref 150–400)
RBC: 3.39 MIL/uL — ABNORMAL LOW (ref 4.22–5.81)
RDW: 17.2 % — ABNORMAL HIGH (ref 11.5–15.5)
WBC: 4.2 10*3/uL (ref 4.0–10.5)
nRBC: 0 % (ref 0.0–0.2)

## 2022-03-23 LAB — PROTEIN / CREATININE RATIO, URINE
Creatinine, Urine: 307 mg/dL
Protein Creatinine Ratio: 0.07 mg/mg{Cre} (ref 0.00–0.15)
Total Protein, Urine: 20 mg/dL

## 2022-03-23 MED ORDER — SODIUM CHLORIDE 0.9 % IV SOLN
2400.0000 mg/m2 | INTRAVENOUS | Status: DC
  Administered 2022-03-23: 4500 mg via INTRAVENOUS
  Filled 2022-03-23: qty 90

## 2022-03-23 MED ORDER — DIPHENOXYLATE-ATROPINE 2.5-0.025 MG PO TABS: 1.0000 | ORAL_TABLET | Freq: Once | ORAL | Status: DC

## 2022-03-23 MED ORDER — SODIUM CHLORIDE 0.9 % IV SOLN
180.0000 mg/m2 | Freq: Once | INTRAVENOUS | Status: AC
  Administered 2022-03-23: 340 mg via INTRAVENOUS
  Filled 2022-03-23: qty 2

## 2022-03-23 MED ORDER — SODIUM CHLORIDE 0.9 % IV SOLN
5.0000 mg/kg | Freq: Once | INTRAVENOUS | Status: AC
  Administered 2022-03-23: 350 mg via INTRAVENOUS
  Filled 2022-03-23: qty 14

## 2022-03-23 MED ORDER — SODIUM CHLORIDE 0.9 % IV SOLN
Freq: Once | INTRAVENOUS | Status: AC
  Filled 2022-03-23: qty 250

## 2022-03-23 MED ORDER — ATROPINE SULFATE 1 MG/ML IV SOLN
0.4000 mg | Freq: Once | INTRAVENOUS | Status: AC
  Administered 2022-03-23: 0.4 mg via INTRAVENOUS

## 2022-03-23 MED ORDER — SODIUM CHLORIDE 0.9 % IV SOLN
10.0000 mg | Freq: Once | INTRAVENOUS | Status: AC
  Administered 2022-03-23: 10 mg via INTRAVENOUS
  Filled 2022-03-23: qty 10

## 2022-03-23 MED ORDER — PALONOSETRON HCL INJECTION 0.25 MG/5ML
0.2500 mg | Freq: Once | INTRAVENOUS | Status: AC
  Administered 2022-03-23: 0.25 mg via INTRAVENOUS

## 2022-03-23 NOTE — Progress Notes (Signed)
[facility] NOTE  Patient Care Team: Sofie Hartigan, MD as PCP - General (Family Medicine) Cammie Sickle, MD as Consulting Physician (Hematology and Oncology)  CHIEF COMPLAINTS/PURPOSE OF CONSULTATION: Colon cancer  #  Oncology History Overview Note  # MAY 2020- 3. 03/09/19 Liver biopsy. Microscopic examination shows malignant cells with glandular architecture consistent with adenocarcinoma. The malignant cells are positive for CK20 and CDX-2. These findings support the clinical impression of metastatic colon adenocarcinoma. 4. 03/10/19 R hemicolectomy. Tumor site cecum. Adenocarcinoma. Mucinous features present. G2. No tumor deposits. Invades visceral peritoneum. No tumor perforation. LVI present. PNI not identified. All margins uninvolved. 1/12 LNs. PT4apN1. Periappendiceal inflammation c/w resolving abscess. Microsatellite stable (MSS). [Dr.Mettu; [facility]]  # SEP 4th 2020 [compared to May 2020]  Interval increase in size of the metastases to the hepatic dome, The metastasis to the left hepatic lobe is unchanged; 2.  New subcentimeter hypoattenuating lesion in the inferior right hepatic lobe, incompletely characterized on CT. 3.  Postsurgical changes following right hemicolectomy.  # OCT 2020- FOLFOX +avastin; CT dec 22nd 2020- [compared to [facility] sep 9th 2020]-Liver- slight progression versus stable disease; CT scan SEP 4th 2021- Progressive disease-left lower lobe lung nodule 8 mm [previously 4 mm]; increase in size of the hepatic metastases by few millimeters.;  Soft tissue nodule adjacent to anastomotic site again increased by few millimeters.STOP FOLFOX; cont avastin  #  SEP 20th, 2021- FOLFIRI+ AVASTIN; HOLD FEB 2022- sec to poor tolerance [peptic ulcer]; 3/30-liver ablation-   [bland embolization of segment 2/3 liver lesion on 12/19/2020;  microwave ablation of both liver lesions.  # ?July 25th, 2022- BEV _ TAS 102.MAY 2022- s/p Liver ablation  # DEC  2022-progressive disease-lung and liver/abdomen; JAN 31st 2023-START Regorefenib.   # FEB /16-EGD [Dr.Anna-duodenal ulcer-? meloxicam]  # JAN 2022- COVID [s/p Mab infusion; pills; skin rash resolved.]  # NGS/F-ONE-MUTATED K-RAS [G]       Cancer of right colon ([facility])  07/05/2019 Initial Diagnosis   Cancer of right colon ([facility])   07/24/2019 - 06/25/2020 Chemotherapy   The patient had dexamethasone (DECADRON) 4 MG tablet, 8 mg, Oral, Daily, 1 of 1 cycle, Start date: --, End date: -- palonosetron (ALOXI) injection 0.25 mg, 0.25 mg, Intravenous,  Once, 23 of 25 cycles Administration: 0.25 mg (07/24/2019), 0.25 mg (08/07/2019), 0.25 mg (08/21/2019), 0.25 mg (09/04/2019), 0.25 mg (09/20/2019), 0.25 mg (10/04/2019), 0.25 mg (10/16/2019), 0.25 mg (10/30/2019), 0.25 mg (11/22/2019), 0.25 mg (12/06/2019), 0.25 mg (12/20/2019), 0.25 mg (01/03/2020), 0.25 mg (01/29/2020), 0.25 mg (02/12/2020), 0.25 mg (02/26/2020), 0.25 mg (03/11/2020), 0.25 mg (03/25/2020), 0.25 mg (04/08/2020), 0.25 mg (04/24/2020), 0.25 mg (05/08/2020), 0.25 mg (05/22/2020), 0.25 mg (06/10/2020), 0.25 mg (06/25/2020) leucovorin 800 mg in dextrose 5 % 250 mL infusion, 844 mg, Intravenous,  Once, 23 of 25 cycles Administration: 800 mg (07/24/2019), 800 mg (08/07/2019), 800 mg (08/21/2019), 800 mg (09/04/2019), 800 mg (09/20/2019), 800 mg (10/04/2019), 800 mg (10/16/2019), 800 mg (10/30/2019), 800 mg (11/22/2019), 800 mg (12/06/2019), 800 mg (12/20/2019), 800 mg (01/03/2020), 800 mg (01/29/2020), 800 mg (02/12/2020), 800 mg (02/26/2020), 800 mg (03/11/2020), 800 mg (03/25/2020), 800 mg (04/08/2020), 800 mg (04/24/2020), 800 mg (05/08/2020), 800 mg (05/22/2020), 800 mg (06/10/2020), 800 mg (06/25/2020) oxaliplatin (ELOXATIN) 180 mg in dextrose 5 % 500 mL chemo infusion, 85 mg/m2 = 180 mg, Intravenous,  Once, 23 of 25 cycles Dose modification: 178 mg (original dose 85 mg/m2, Cycle 17, Reason: Other (see comments), Comment: insurance adjusted dose ) Administration: 180 mg (07/24/2019), 180  mg  (08/07/2019), 180 mg (08/21/2019), 180 mg (09/04/2019), 180 mg (09/20/2019), 180 mg (10/04/2019), 180 mg (10/16/2019), 180 mg (10/30/2019), 180 mg (11/22/2019), 180 mg (12/06/2019), 180 mg (12/20/2019), 180 mg (01/03/2020), 180 mg (01/29/2020), 180 mg (02/12/2020), 180 mg (02/26/2020), 180 mg (03/11/2020), 180 mg (04/08/2020), 180 mg (04/24/2020), 180 mg (05/08/2020), 180 mg (05/22/2020), 180 mg (06/10/2020), 180 mg (06/25/2020) fluorouracil (ADRUCIL) 5,000 mg in sodium chloride 0.9 % 150 mL chemo infusion, 5,050 mg, Intravenous, 1 Day/Dose, 23 of 25 cycles Administration: 5,000 mg (07/24/2019), 5,000 mg (08/07/2019), 5,000 mg (08/21/2019), 5,050 mg (09/04/2019), 5,000 mg (10/04/2019), 5,000 mg (10/16/2019), 5,000 mg (11/22/2019), 5,000 mg (12/06/2019), 5,000 mg (12/20/2019), 5,000 mg (01/03/2020), 5,000 mg (01/29/2020), 5,000 mg (02/12/2020), 5,000 mg (02/26/2020), 5,000 mg (03/11/2020), 5,000 mg (03/25/2020), 5,000 mg (04/08/2020), 5,000 mg (04/24/2020), 5,000 mg (05/08/2020), 5,000 mg (05/22/2020), 5,000 mg (06/10/2020), 5,000 mg (06/25/2020) bevacizumab-bvzr (ZIRABEV) 400 mg in sodium chloride 0.9 % 100 mL chemo infusion, 5 mg/kg = 400 mg, Intravenous,  Once, 23 of 25 cycles Administration: 400 mg (08/07/2019), 400 mg (08/21/2019), 400 mg (09/04/2019), 400 mg (09/20/2019), 400 mg (10/04/2019), 400 mg (10/16/2019), 400 mg (10/30/2019), 400 mg (11/22/2019), 400 mg (12/06/2019), 400 mg (12/20/2019), 400 mg (01/03/2020), 400 mg (01/29/2020), 400 mg (02/12/2020), 400 mg (02/26/2020), 400 mg (03/11/2020), 400 mg (03/25/2020), 400 mg (04/08/2020), 400 mg (04/24/2020), 400 mg (05/08/2020), 400 mg (05/22/2020), 400 mg (06/10/2020), 400 mg (06/25/2020)   for chemotherapy treatment.     07/08/2020 -  Chemotherapy   Patient is on Treatment Plan : COLORECTAL FOLFIRI / BEVACIZUMAB Q14D      11/05/2020 Cancer Staging   Staging form: Colon and Rectum, AJCC 8th Edition - Clinical: Stage IVC (pM1c) - Signed by Cammie Sickle, MD on 11/05/2020     HISTORY OF PRESENTING  ILLNESS: He is alone.  Ambulating independently.  Oscar [name] 81 y.o.  male with metastatic colon cancer to the liver-s/p multiple lines of therapy -currently on FOLFIRI + Bev is here follow-up.  Patient is currently s/p chemotherapy cycle #3 .  Denies any worsening nausea vomiting.  No worsening diarrhea.  Chronic mild abdominal pain for which he has been taking oxycodone 5 mg up to 1-2 pill a day.   No sores in the mouth.  No rash on palms and soles.    Patient has lost weight.  However claims to have good appetite.  Review of Systems  Constitutional:  Positive for malaise/fatigue. Negative for chills, diaphoresis and fever.  HENT:  Negative for nosebleeds and sore throat.   Eyes:  Negative for double vision.  Respiratory:  Negative for cough, hemoptysis, sputum production, shortness of breath and wheezing.   Cardiovascular:  Negative for chest pain, palpitations, orthopnea and leg swelling.  Gastrointestinal:  Negative for blood in stool, constipation, heartburn, melena, nausea and vomiting.  Genitourinary:  Negative for dysuria, frequency and urgency.  Musculoskeletal:  Positive for back pain and joint pain.  Skin: Negative.  Negative for itching and rash.  Neurological:  Positive for tingling. Negative for focal weakness, weakness and headaches.  Endo/Heme/Allergies:  Does not bruise/bleed easily.  Psychiatric/Behavioral:  Negative for depression. The patient is not nervous/anxious and does not have insomnia.     MEDICAL HISTORY:  Past Medical History:  Diagnosis Date   Arthritis    Cancer ([facility])    colon cancer 02/2019 per pt    Family history of adverse reaction to anesthesia    cousin took all night to wake up from anesthesia   H/O  colon cancer, stage IV    Hyperlipemia    Neuromuscular disorder ([facility])    Pre-diabetes     SURGICAL HISTORY: Past Surgical History:  Procedure Laterality Date   COLON SURGERY     ESOPHAGOGASTRODUODENOSCOPY (EGD) WITH PROPOFOL N/A  12/26/2020   Procedure: ESOPHAGOGASTRODUODENOSCOPY (EGD) WITH PROPOFOL;  Surgeon: [name], MD;  Location: [facility] ENDOSCOPY;  Service: Gastroenterology;  Laterality: N/A;   IR ANGIOGRAM SELECTIVE EACH ADDITIONAL VESSEL  12/19/2020   IR ANGIOGRAM SELECTIVE EACH ADDITIONAL VESSEL  03/24/2021   IR ANGIOGRAM SELECTIVE EACH ADDITIONAL VESSEL  03/24/2021   IR ANGIOGRAM SELECTIVE EACH ADDITIONAL VESSEL  04/04/2021   IR ANGIOGRAM SELECTIVE EACH ADDITIONAL VESSEL  04/04/2021   IR ANGIOGRAM SELECTIVE EACH ADDITIONAL VESSEL  04/04/2021   IR ANGIOGRAM VISCERAL SELECTIVE  12/19/2020   IR ANGIOGRAM VISCERAL SELECTIVE  03/24/2021   IR ANGIOGRAM VISCERAL SELECTIVE  04/04/2021   IR ANGIOGRAM VISCERAL SELECTIVE  04/04/2021   IR EMBO ARTERIAL NOT HEMORR HEMANG INC GUIDE ROADMAPPING  03/24/2021   IR EMBO TUMOR ORGAN ISCHEMIA INFARCT INC GUIDE ROADMAPPING  12/19/2020   IR EMBO TUMOR ORGAN ISCHEMIA INFARCT INC GUIDE ROADMAPPING  04/04/2021   IR IMAGING GUIDED PORT INSERTION  07/20/2019   IR RADIOLOGIST EVAL & MGMT  12/05/2020   IR RADIOLOGIST EVAL & MGMT  02/11/2021   IR RADIOLOGIST EVAL & MGMT  03/04/2021   IR RADIOLOGIST EVAL & MGMT  04/29/2021   IR RADIOLOGIST EVAL & MGMT  07/22/2021   IR RADIOLOGIST EVAL & MGMT  11/19/2021   IR US GUIDE VASC ACCESS RIGHT  12/19/2020   IR US GUIDE VASC ACCESS RIGHT  03/24/2021   IR US GUIDE VASC ACCESS RIGHT  04/04/2021   JOINT REPLACEMENT Left 2010   Knee   RADIOLOGY WITH ANESTHESIA N/A 01/15/2021   Procedure: CT WITH ANESTHESIA MICROWAVE ABLATION OF LIVER;  Surgeon: Criselda Peaches, MD;  Location: [facility] ORS;  Service: Anesthesiology;  Laterality: N/A;    SOCIAL HISTORY: Social History   Socioeconomic History   Marital status: Married    Spouse name: Not on file   Number of children: Not on file   Years of education: Not on file   Highest education level: Not on file  Occupational History   Not on file  Tobacco Use   Smoking status: Every Day    Packs/day: 0.25    Years: 15.00    Pack  years: 3.75    Types: Cigarettes   Smokeless tobacco: Never   Tobacco comments:    1 to 2 cigarettes a day occasionally  Vaping Use   Vaping Use: Never used  Substance and Sexual Activity   Alcohol use: Yes    Comment: rarely    Drug use: No   Sexual activity: Not on file  Other Topics Concern   Not on file  Social History Narrative   Recruitment consultant retd; lives in [place]; smoking 3cig/day; [3/4 ppd x started at 9 years]; no alcohol. Son & daughter; wife dementia [waiting for placement].    Social Determinants of Health   Financial Resource Strain: Not on file  Food Insecurity: Not on file  Transportation Needs: Not on file  Physical Activity: Not on file  Stress: Not on file  Social Connections: Not on file  Intimate Partner Violence: Not on file    FAMILY HISTORY: Family History  Problem Relation Age of Onset   Peptic Ulcer Disease Father     ALLERGIES:  is allergic to ace inhibitors.  MEDICATIONS:  Current Outpatient Medications  Medication Sig Dispense Refill   oxyCODONE (OXY IR/ROXICODONE) 5 MG immediate release tablet Take 1 tablet (5 mg total) by mouth every 6 (six) hours as needed for severe pain. 60 tablet 0   tamsulosin (FLOMAX) 0.4 MG CAPS capsule Take 1 capsule (0.4 mg total) by mouth daily. 90 capsule 1   amLODipine (NORVASC) 5 MG tablet Take 5 mg by mouth daily. (Patient not taking: Reported on 10/03/2021)     diphenoxylate-atropine (LOMOTIL) 2.5-0.025 MG tablet Take 1 tablet by mouth 4 (four) times daily as needed for diarrhea or loose stools. Take it along with immodium (Patient not taking: Reported on 02/23/2022) 60 tablet 0   ondansetron (ZOFRAN) 8 MG tablet Take 1 tablet (8 mg total) by mouth every 8 (eight) hours as needed for nausea or vomiting. (Patient not taking: Reported on 03/09/2022) 30 tablet 0   polyethylene glycol powder (GLYCOLAX/MIRALAX) 17 GM/SCOOP powder Take by mouth as needed. (Patient not taking: Reported on 12/29/2021)      pravastatin (PRAVACHOL) 40 MG tablet Take 40 mg by mouth daily.  (Patient not taking: Reported on 06/09/2021)     predniSONE (DELTASONE) 20 MG tablet Take 1 tablet (20 mg total) by mouth daily with breakfast. (Patient not taking: Reported on 02/06/2022) 30 tablet 1   prochlorperazine (COMPAZINE) 10 MG tablet Take 1 tablet (10 mg total) by mouth every 6 (six) hours as needed for nausea or vomiting. (Patient not taking: Reported on 03/09/2022) 60 tablet 0   regorafenib (STIVARGA) 40 MG tablet Take 2 tablets (80 mg total) by mouth daily with breakfast. Take for 21 days, then hold for 7 days. Repeat every 28 days. (Patient not taking: Reported on 02/06/2022) 42 tablet 1   senna (SENOKOT) 8.6 MG TABS tablet Take 1 tablet (8.6 mg total) by mouth daily. (Patient not taking: Reported on 12/29/2021) 120 tablet 0   sucralfate (CARAFATE) 1 g tablet Take 1 tablet (1 g total) by mouth 4 (four) times daily -  with meals and at bedtime. (Patient not taking: Reported on 11/18/2021) 90 tablet 3   No current facility-administered medications for this visit.      Marland Kitchen  PHYSICAL EXAMINATION: ECOG PERFORMANCE STATUS: 0 - Asymptomatic  Vitals:   03/23/22 0831  BP: 114/70  Pulse: 75  Resp: 17  Temp: (!) 97 F (36.1 C)  SpO2: 100%    Filed Weights   03/23/22 0831  Weight: 148 lb 3.2 oz (67.2 kg)     Physical Exam HENT:     Head: Normocephalic and atraumatic.     Mouth/Throat:     Pharynx: No oropharyngeal exudate.  Eyes:     Pupils: Pupils are equal, round, and reactive to light.  Cardiovascular:     Rate and Rhythm: Normal rate and regular rhythm.  Pulmonary:     Effort: No respiratory distress.     Breath sounds: No wheezing.     Comments: Decreased breath sounds bilaterally.  No wheeze or crackles Abdominal:     General: Bowel sounds are normal. There is no distension.     Palpations: Abdomen is soft. There is no mass.     Tenderness: There is no abdominal tenderness. There is no guarding or  rebound.  Musculoskeletal:        General: No tenderness. Normal range of motion.     Cervical back: Normal range of motion and neck supple.  Skin:    General: Skin is warm.  Neurological:     Mental  Status: He is alert and oriented to person, place, and time.  Psychiatric:        Mood and Affect: Affect normal.   LABORATORY DATA:  I have reviewed the data as listed Lab Results  Component Value Date   WBC 4.2 03/23/2022   HGB 10.9 (L) 03/23/2022   HCT 33.3 (L) 03/23/2022   MCV 98.2 03/23/2022   PLT 185 03/23/2022   Recent Labs    02/23/22 0835 03/09/22 0916 03/23/22 0813  NA 137 137 138  K 3.8 3.8 3.6  CL 104 107 106  CO2 _0 GLUCOSE 178* 156* 134*  BUN _1 CREATININE 0.91 0.70 0.84  CALCIUM 8.2* 7.8* 8.2*  GFRNONAA >60 >60 >60  PROT 7.7 7.3 7.5  ALBUMIN 2.3* 2.4* 2.7*  AST 43* 37 36  ALT _2 ALKPHOS 198* 176* 188*  BILITOT 0.7 0.6 0.6    RADIOGRAPHIC STUDIES: I have personally reviewed the radiological images as listed and agreed with the findings in the report. No results found.   Latest Reference Range & Units Most Recent 06/09/21 08:30 06/25/21 10:24 07/07/21 09:06 07/21/21 08:32 08/04/21 08:54 08/18/21 08:31 09/01/21 08:47 09/25/21 08:29 10/28/21 10:14 11/18/21 14:20 12/08/21 09:06  CEA 0.0 - 4.7 ng/mL 301.0 (H) 12/08/21 09:06 91.6 (H) 84.1 (H) 87.6 (H) 89.1 (H) 108.0 (H) 108.0 (H) 107.0 (H) 105.0 (H) 119.0 (H) 177.0 (H) 301.0 (H)  (H): Data is abnormally high  ASSESSMENT & PLAN:   Cancer of right colon ([facility]) #Right-sided colon adenocarcinoma-with synchronous metastasis to liver/unresectable;  CT scan April 2023- Interval progression of metastatic disease in the chest, abdomen,and pelvis ; Interval development of numerous new bilateral pulmonary nodules with progression of pre-existing pulmonary nodules. Interval progression of mediastinal and hilar lymphadenopathy;Interval development of multiple new liver metastases since the previous CT  scan.  Interval progression of the right adrenal metastatic lesion with progression of the amorphous mass just inferior to the anastomosis and just anterior to the lower pole right kidney.  Metastatic lesion in the upper pole right kidney is progressive and now peers to invade the right renal vein to the level of the confluence with the IVC.  Interval progression of omental, gastrocolic ligament, and mesenteric metastases.  Currently on FOLFIRI+ Zira-Bev. S/p Evaluation at [facility] II opinion/reviewed the [facility] from [facility]; and also discussed with Dr Dennison Nancy, who agrees with current treatment plan.   #Proceed with cycle #4 of FOLFIRI plus Bev.  labs today reviewed;  acceptable for treatment today. CEA trending down. Will plan to scan after  6 cycles of chemo.  Will order imaging at next visit.  # Nausea/vomitting- sec to chemo G-1; zofran prn.   # Right upper quadrant pain-secondary to malignancy-s/p evaluation with [name]; continue oxycodone 5 mg up to 1-2 pills a day as needed.  STABLE.  # Proteinuria: UA +100 protein protein:Creat ratio- WNL;  Okay to proceed with BeV.   #Weight loss: sec to malignancy; appetite improved; monitor for now- worse; s/p evaluation with [facility].   # Genetic counseling: 2 brother/sister; 2 biological children. Liquid biopsy/guardant testing/at [facility] [May 2023]-"abnormal" [report not available to me]; recommend evaluation with genetic counseling.  # Mediport- functioning-STABLE>  # DISPOSITION:  #Genetic counseling re: abnormal gaurdant/liquid biopsy.   # Chemo today; pump off on D-3  # follow up in 2 weeks-  MD; labs- cbc/cmp CEA ;urine protein creatinine ratio;  FOLFIRI +Zira-Bev; pump off D-3;-Dr.B  All questions were answered. The patient knows to call the clinic with any problems, questions or concerns.    Cammie Sickle, MD 03/23/2022 9:46 PM

## 2022-03-23 NOTE — Assessment & Plan Note (Addendum)
#  Right-sided colon adenocarcinoma-with synchronous metastasis to liver/unresectable;  CT scan April 2023- Interval progression of metastatic disease in the chest, abdomen,and pelvis ; Interval development of numerous new bilateral pulmonary nodules with progression of pre-existing pulmonary nodules. Interval progression of mediastinal and hilar lymphadenopathy;Interval development of multiple new liver metastases since the previous CT scan.  Interval progression of the right adrenal metastatic lesion with progression of the amorphous mass just inferior to the anastomosis and just anterior to the lower pole right kidney.  Metastatic lesion in the upper pole right kidney is progressive and now peers to invade the right renal vein to the level of the confluence with the IVC.  Interval progression of omental, gastrocolic ligament, and mesenteric metastases.  Currently on FOLFIRI+ Zira-Bev. S/p Evaluation at [facility] II opinion/reviewed the note from [facility]; and also discussed with Dr Dennison Nancy, who agrees with current treatment plan.   #Proceed with cycle #4 of FOLFIRI plus Bev.  labs today reviewed;  acceptable for treatment today. CEA trending down. Will plan to scan after  6 cycles of chemo.  Will order imaging at next visit.  # Nausea/vomitting- sec to chemo G-1; zofran prn.   # Right upper quadrant pain-secondary to malignancy-s/p evaluation with [name]; continue oxycodone 5 mg up to 1-2 pills a day as needed.  STABLE.  # Proteinuria: UA +100 protein protein:Creat ratio- WNL;  Okay to proceed with BeV.   #Weight loss: sec to malignancy; appetite improved; monitor for now- worse; s/p evaluation with [facility].   # Genetic counseling: 2 brother/sister; 2 biological children. Liquid biopsy/guardant testing/at [facility] [May 2023]-"abnormal" [report not available to me]; recommend evaluation with genetic counseling.  # Mediport- functioning-STABLE>  # DISPOSITION:  #Genetic counseling re: abnormal  gaurdant/liquid biopsy.   # Chemo today; pump off on D-3  # follow up in 2 weeks-  MD; labs- cbc/cmp CEA ;urine protein creatinine ratio;  FOLFIRI +Zira-Bev; pump off D-3;-Dr.B

## 2022-03-23 NOTE — Patient Instructions (Signed)
[facility] [place]  Discharge Instructions: Thank you for choosing [facility] to provide your oncology and hematology care.  If you have a lab appointment with the [facility], please go directly to the [facility] and check in at the registration area.  Wear comfortable clothing and clothing appropriate for easy access to any Portacath or PICC line.   We strive to give you quality time with your provider. You may need to reschedule your appointment if you arrive late (15 or more minutes).  Arriving late affects you and other patients whose appointments are after yours.  Also, if you miss three or more appointments without notifying the office, you may be dismissed from the clinic at the provider's discretion.      For prescription refill requests, have your pharmacy contact our office and allow 72 hours for refills to be completed.    Today you received the following chemotherapy and/or immunotherapy agents ZIRABEV, IRINOTECAN, 5 FU       To help prevent nausea and vomiting after your treatment, we encourage you to take your nausea medication as directed.  BELOW ARE SYMPTOMS THAT SHOULD BE REPORTED IMMEDIATELY: *FEVER GREATER THAN 100.4 F (38 C) OR HIGHER *CHILLS OR SWEATING *NAUSEA AND VOMITING THAT IS NOT CONTROLLED WITH YOUR NAUSEA MEDICATION *UNUSUAL SHORTNESS OF BREATH *UNUSUAL BRUISING OR BLEEDING *URINARY PROBLEMS (pain or burning when urinating, or frequent urination) *BOWEL PROBLEMS (unusual diarrhea, constipation, pain near the anus) TENDERNESS IN MOUTH AND THROAT WITH OR WITHOUT PRESENCE OF ULCERS (sore throat, sores in mouth, or a toothache) UNUSUAL RASH, SWELLING OR PAIN  UNUSUAL VAGINAL DISCHARGE OR ITCHING   Items with * indicate a potential emergency and should be followed up as soon as possible or go to the Emergency Department if any problems should occur.  Please show the CHEMOTHERAPY ALERT CARD or IMMUNOTHERAPY ALERT  CARD at check-in to the Emergency Department and triage nurse.  Should you have questions after your visit or need to cancel or reschedule your appointment, please contact [facility] [place] [facility] [place]  [phone] and follow the prompts.  Office hours are 8:00 a.m. to 4:30 p.m. Monday - Friday. Please note that voicemails left after 4:00 p.m. may not be returned until the following business day.  We are closed weekends and major holidays. You have access to a nurse at all times for urgent questions. Please call the main number to the clinic [phone] and follow the prompts.  For any non-urgent questions, you may also contact your provider using MyChart. We now offer e-Visits for anyone 69 and older to request care online for non-urgent symptoms. For details visit [email or web address].   Also download the MyChart app! Go to the app store, search "MyChart", open the app, select [facility], and log in with your MyChart username and password.  Due to Covid, a mask is required upon entering the hospital/clinic. If you do not have a mask, one will be given to you upon arrival. For doctor visits, patients may have 1 support person aged 19 or older with them. For treatment visits, patients cannot have anyone with them due to current Covid guidelines and our immunocompromised population.   Bevacizumab injection What is this medication? BEVACIZUMAB (be va SIZ yoo mab) is a monoclonal antibody. It is used to treat many types of cancer. This medicine may be used for other purposes; ask your health care provider or pharmacist if you have questions. COMMON BRAND NAME(S): Alymsys, Avastin, MVASI, Noah Charon  What should I tell my care team before I take this medication? They need to know if you have any of these conditions: diabetes heart disease high blood pressure history of coughing up blood prior anthracycline chemotherapy (e.g., doxorubicin, daunorubicin, epirubicin) recent or ongoing  radiation therapy recent or planning to have surgery stroke an unusual or allergic reaction to bevacizumab, hamster proteins, mouse proteins, other medicines, foods, dyes, or preservatives pregnant or trying to get pregnant breast-feeding How should I use this medication? This medicine is for infusion into a vein. It is given by a health care professional in a hospital or clinic setting. Talk to your pediatrician regarding the use of this medicine in children. Special care may be needed. Overdosage: If you think you have taken too much of this medicine contact a poison control center or emergency room at once. NOTE: This medicine is only for you. Do not share this medicine with others. What if I miss a dose? It is important not to miss your dose. Call your doctor or health care professional if you are unable to keep an appointment. What may interact with this medication? Interactions are not expected. This list may not describe all possible interactions. Give your health care provider a list of all the medicines, herbs, non-prescription drugs, or dietary supplements you use. Also tell them if you smoke, drink alcohol, or use illegal drugs. Some items may interact with your medicine. What should I watch for while using this medication? Your condition will be monitored carefully while you are receiving this medicine. You will need important blood work and urine testing done while you are taking this medicine. This medicine may increase your risk to bruise or bleed. Call your doctor or health care professional if you notice any unusual bleeding. Before having surgery, talk to your health care provider to make sure it is ok. This drug can increase the risk of poor healing of your surgical site or wound. You will need to stop this drug for 28 days before surgery. After surgery, wait at least 28 days before restarting this drug. Make sure the surgical site or wound is healed enough before restarting  this drug. Talk to your health care provider if questions. Do not become pregnant while taking this medicine or for 6 months after stopping it. Women should inform their doctor if they wish to become pregnant or think they might be pregnant. There is a potential for serious side effects to an unborn child. Talk to your health care professional or pharmacist for more information. Do not breast-feed an infant while taking this medicine and for 6 months after the last dose. This medicine has caused ovarian failure in some women. This medicine may interfere with the ability to have a child. You should talk to your doctor or health care professional if you are concerned about your fertility. What side effects may I notice from receiving this medication? Side effects that you should report to your doctor or health care professional as soon as possible: allergic reactions like skin rash, itching or hives, swelling of the face, lips, or tongue chest pain or chest tightness chills coughing up blood high fever seizures severe constipation signs and symptoms of bleeding such as bloody or black, tarry stools; red or dark-brown urine; spitting up blood or brown material that looks like coffee grounds; red spots on the skin; unusual bruising or bleeding from the eye, gums, or nose signs and symptoms of a blood clot such as breathing problems; chest pain;  severe, sudden headache; pain, swelling, warmth in the leg signs and symptoms of a stroke like changes in vision; confusion; trouble speaking or understanding; severe headaches; sudden numbness or weakness of the face, arm or leg; trouble walking; dizziness; loss of balance or coordination stomach pain sweating swelling of legs or ankles vomiting weight gain Side effects that usually do not require medical attention (report to your doctor or health care professional if they continue or are bothersome): back pain changes in taste decreased appetite dry  skin nausea tiredness This list may not describe all possible side effects. Call your doctor for medical advice about side effects. You may report side effects to FDA at 1-800-FDA-1088. Where should I keep my medication? This drug is given in a hospital or clinic and will not be stored at home. NOTE: This sheet is a summary. It may not cover all possible information. If you have questions about this medicine, talk to your doctor, pharmacist, or health care provider.  2023 Elsevier/Gold Standard (2021-09-05 00:00:00)  Irinotecan injection What is this medication? IRINOTECAN (ir in oh TEE kan ) is a chemotherapy drug. It is used to treat colon and rectal cancer. This medicine may be used for other purposes; ask your health care provider or pharmacist if you have questions. COMMON BRAND NAME(S): Camptosar What should I tell my care team before I take this medication? They need to know if you have any of these conditions: dehydration diarrhea infection (especially a virus infection such as chickenpox, cold sores, or herpes) liver disease low blood counts, like low white cell, platelet, or red cell counts low levels of calcium, magnesium, or potassium in the blood recent or ongoing radiation therapy an unusual or allergic reaction to irinotecan, other medicines, foods, dyes, or preservatives pregnant or trying to get pregnant breast-feeding How should I use this medication? This drug is given as an infusion into a vein. It is administered in a hospital or clinic by a specially trained health care professional. Talk to your pediatrician regarding the use of this medicine in children. Special care may be needed. Overdosage: If you think you have taken too much of this medicine contact a poison control center or emergency room at once. NOTE: This medicine is only for you. Do not share this medicine with others. What if I miss a dose? It is important not to miss your dose. Call your doctor or  health care professional if you are unable to keep an appointment. What may interact with this medication? Do not take this medicine with any of the following medications: cobicistat itraconazole This medicine may interact with the following medications: antiviral medicines for HIV or AIDS certain antibiotics like rifampin or rifabutin certain medicines for fungal infections like ketoconazole, posaconazole, and voriconazole certain medicines for seizures like carbamazepine, phenobarbital, phenotoin clarithromycin gemfibrozil nefazodone [facility] John's Wort This list may not describe all possible interactions. Give your health care provider a list of all the medicines, herbs, non-prescription drugs, or dietary supplements you use. Also tell them if you smoke, drink alcohol, or use illegal drugs. Some items may interact with your medicine. What should I watch for while using this medication? Your condition will be monitored carefully while you are receiving this medicine. You will need important blood work done while you are taking this medicine. This drug may make you feel generally unwell. This is not uncommon, as chemotherapy can affect healthy cells as well as cancer cells. Report any side effects. Continue your course of treatment even  though you feel ill unless your doctor tells you to stop. In some cases, you may be given additional medicines to help with side effects. Follow all directions for their use. You may get drowsy or dizzy. Do not drive, use machinery, or do anything that needs mental alertness until you know how this medicine affects you. Do not stand or sit up quickly, especially if you are an older patient. This reduces the risk of dizzy or fainting spells. Call your health care professional for advice if you get a fever, chills, or sore throat, or other symptoms of a cold or flu. Do not treat yourself. This medicine decreases your body's ability to fight infections. Try to avoid  being around people who are sick. Avoid taking products that contain aspirin, acetaminophen, ibuprofen, naproxen, or ketoprofen unless instructed by your doctor. These medicines may hide a fever. This medicine may increase your risk to bruise or bleed. Call your doctor or health care professional if you notice any unusual bleeding. Be careful brushing and flossing your teeth or using a toothpick because you may get an infection or bleed more easily. If you have any dental work done, tell your dentist you are receiving this medicine. Do not become pregnant while taking this medicine or for 6 months after stopping it. Women should inform their health care professional if they wish to become pregnant or think they might be pregnant. Men should not father a child while taking this medicine and for 3 months after stopping it. There is potential for serious side effects to an unborn child. Talk to your health care professional for more information. Do not breast-feed an infant while taking this medicine or for 7 days after stopping it. This medicine has caused ovarian failure in some women. This medicine may make it more difficult to get pregnant. Talk to your health care professional if you are concerned about your fertility. This medicine has caused decreased sperm counts in some men. This may make it more difficult to father a child. Talk to your health care professional if you are concerned about your fertility. What side effects may I notice from receiving this medication? Side effects that you should report to your doctor or health care professional as soon as possible: allergic reactions like skin rash, itching or hives, swelling of the face, lips, or tongue chest pain diarrhea flushing, runny nose, sweating during infusion low blood counts - this medicine may decrease the number of white blood cells, red blood cells and platelets. You may be at increased risk for infections and bleeding. nausea,  vomiting pain, swelling, warmth in the leg signs of decreased platelets or bleeding - bruising, pinpoint red spots on the skin, black, tarry stools, blood in the urine signs of infection - fever or chills, cough, sore throat, pain or difficulty passing urine signs of decreased red blood cells - unusually weak or tired, fainting spells, lightheadedness Side effects that usually do not require medical attention (report to your doctor or health care professional if they continue or are bothersome): constipation hair loss headache loss of appetite mouth sores stomach pain This list may not describe all possible side effects. Call your doctor for medical advice about side effects. You may report side effects to FDA at 1-800-FDA-1088. Where should I keep my medication? This drug is given in a hospital or clinic and will not be stored at home. NOTE: This sheet is a summary. It may not cover all possible information. If you have questions about  this medicine, talk to your doctor, pharmacist, or health care provider.  2023 Elsevier/Gold Standard (2021-09-05 00:00:00)  Fluorouracil, 5-FU injection What is this medication? FLUOROURACIL, 5-FU (flure oh YOOR a sil) is a chemotherapy drug. It slows the growth of cancer cells. This medicine is used to treat many types of cancer like breast cancer, colon or rectal cancer, pancreatic cancer, and stomach cancer. This medicine may be used for other purposes; ask your health care provider or pharmacist if you have questions. COMMON BRAND NAME(S): Adrucil What should I tell my care team before I take this medication? They need to know if you have any of these conditions: blood disorders dihydropyrimidine dehydrogenase (DPD) deficiency infection (especially a virus infection such as chickenpox, cold sores, or herpes) kidney disease liver disease malnourished, poor nutrition recent or ongoing radiation therapy an unusual or allergic reaction to  fluorouracil, other chemotherapy, other medicines, foods, dyes, or preservatives pregnant or trying to get pregnant breast-feeding How should I use this medication? This drug is given as an infusion or injection into a vein. It is administered in a hospital or clinic by a specially trained health care professional. Talk to your pediatrician regarding the use of this medicine in children. Special care may be needed. Overdosage: If you think you have taken too much of this medicine contact a poison control center or emergency room at once. NOTE: This medicine is only for you. Do not share this medicine with others. What if I miss a dose? It is important not to miss your dose. Call your doctor or health care professional if you are unable to keep an appointment. What may interact with this medication? Do not take this medicine with any of the following medications: live virus vaccines This medicine may also interact with the following medications: medicines that treat or prevent blood clots like warfarin, enoxaparin, and dalteparin This list may not describe all possible interactions. Give your health care provider a list of all the medicines, herbs, non-prescription drugs, or dietary supplements you use. Also tell them if you smoke, drink alcohol, or use illegal drugs. Some items may interact with your medicine. What should I watch for while using this medication? Visit your doctor for checks on your progress. This drug may make you feel generally unwell. This is not uncommon, as chemotherapy can affect healthy cells as well as cancer cells. Report any side effects. Continue your course of treatment even though you feel ill unless your doctor tells you to stop. In some cases, you may be given additional medicines to help with side effects. Follow all directions for their use. Call your doctor or health care professional for advice if you get a fever, chills or sore throat, or other symptoms of a  cold or flu. Do not treat yourself. This drug decreases your body's ability to fight infections. Try to avoid being around people who are sick. This medicine may increase your risk to bruise or bleed. Call your doctor or health care professional if you notice any unusual bleeding. Be careful brushing and flossing your teeth or using a toothpick because you may get an infection or bleed more easily. If you have any dental work done, tell your dentist you are receiving this medicine. Avoid taking products that contain aspirin, acetaminophen, ibuprofen, naproxen, or ketoprofen unless instructed by your doctor. These medicines may hide a fever. Do not become pregnant while taking this medicine. Women should inform their doctor if they wish to become pregnant or think they might  be pregnant. There is a potential for serious side effects to an unborn child. Talk to your health care professional or pharmacist for more information. Do not breast-feed an infant while taking this medicine. Men should inform their doctor if they wish to father a child. This medicine may lower sperm counts. Do not treat diarrhea with over the counter products. Contact your doctor if you have diarrhea that lasts more than 2 days or if it is severe and watery. This medicine can make you more sensitive to the sun. Keep out of the sun. If you cannot avoid being in the sun, wear protective clothing and use sunscreen. Do not use sun lamps or tanning beds/booths. What side effects may I notice from receiving this medication? Side effects that you should report to your doctor or health care professional as soon as possible: allergic reactions like skin rash, itching or hives, swelling of the face, lips, or tongue low blood counts - this medicine may decrease the number of white blood cells, red blood cells and platelets. You may be at increased risk for infections and bleeding. signs of infection - fever or chills, cough, sore throat, pain  or difficulty passing urine signs of decreased platelets or bleeding - bruising, pinpoint red spots on the skin, black, tarry stools, blood in the urine signs of decreased red blood cells - unusually weak or tired, fainting spells, lightheadedness breathing problems changes in vision chest pain mouth sores nausea and vomiting pain, swelling, redness at site where injected pain, tingling, numbness in the hands or feet redness, swelling, or sores on hands or feet stomach pain unusual bleeding Side effects that usually do not require medical attention (report to your doctor or health care professional if they continue or are bothersome): changes in finger or toe nails diarrhea dry or itchy skin hair loss headache loss of appetite sensitivity of eyes to the light stomach upset unusually teary eyes This list may not describe all possible side effects. Call your doctor for medical advice about side effects. You may report side effects to FDA at 1-800-FDA-1088. Where should I keep my medication? This drug is given in a hospital or clinic and will not be stored at home. NOTE: This sheet is a summary. It may not cover all possible information. If you have questions about this medicine, talk to your doctor, pharmacist, or health care provider.  2023 Elsevier/Gold Standard (2021-09-05 00:00:00)

## 2022-03-23 NOTE — Progress Notes (Signed)
Patient here for oncology follow-up appointment, expresses no complaints or concerns at this time.    

## 2022-03-24 ENCOUNTER — Inpatient Hospital Stay (HOSPITAL_BASED_OUTPATIENT_CLINIC_OR_DEPARTMENT_OTHER): Payer: Medicare HMO | Admitting: Hospice and Palliative Medicine

## 2022-03-24 DIAGNOSIS — Z515 Encounter for palliative care: Secondary | ICD-10-CM

## 2022-03-24 LAB — CEA: CEA: 220 ng/mL — ABNORMAL HIGH (ref 0.0–4.7)

## 2022-03-24 NOTE — Progress Notes (Signed)
Did not reach patient by phone.  Voicemail left.  Will reschedule. °

## 2022-03-25 ENCOUNTER — Inpatient Hospital Stay: Payer: Medicare HMO

## 2022-03-25 VITALS — BP 127/73 | HR 65 | Resp 18

## 2022-03-25 DIAGNOSIS — C182 Malignant neoplasm of ascending colon: Secondary | ICD-10-CM

## 2022-03-25 DIAGNOSIS — C787 Secondary malignant neoplasm of liver and intrahepatic bile duct: Secondary | ICD-10-CM | POA: Diagnosis not present

## 2022-03-25 DIAGNOSIS — Z7189 Other specified counseling: Secondary | ICD-10-CM

## 2022-03-25 DIAGNOSIS — Z5112 Encounter for antineoplastic immunotherapy: Secondary | ICD-10-CM | POA: Diagnosis not present

## 2022-03-25 DIAGNOSIS — Z5111 Encounter for antineoplastic chemotherapy: Secondary | ICD-10-CM | POA: Diagnosis not present

## 2022-03-25 DIAGNOSIS — Z452 Encounter for adjustment and management of vascular access device: Secondary | ICD-10-CM | POA: Diagnosis not present

## 2022-03-25 DIAGNOSIS — C7971 Secondary malignant neoplasm of right adrenal gland: Secondary | ICD-10-CM | POA: Diagnosis not present

## 2022-03-25 DIAGNOSIS — C7800 Secondary malignant neoplasm of unspecified lung: Secondary | ICD-10-CM | POA: Diagnosis not present

## 2022-03-25 DIAGNOSIS — C786 Secondary malignant neoplasm of retroperitoneum and peritoneum: Secondary | ICD-10-CM | POA: Diagnosis not present

## 2022-03-25 MED ORDER — HEPARIN SOD (PORK) LOCK FLUSH 100 UNIT/ML IV SOLN
500.0000 [IU] | Freq: Once | INTRAVENOUS | Status: AC | PRN
  Administered 2022-03-25: 500 [IU]
  Filled 2022-03-25: qty 5

## 2022-03-25 MED ORDER — SODIUM CHLORIDE 0.9% FLUSH
10.0000 mL | INTRAVENOUS | Status: DC | PRN
  Administered 2022-03-25: 10 mL
  Filled 2022-03-25: qty 10

## 2022-03-30 ENCOUNTER — Other Ambulatory Visit: Payer: Self-pay | Admitting: *Deleted

## 2022-03-30 MED ORDER — OXYCODONE HCL 5 MG PO TABS: 5.0000 mg | ORAL_TABLET | Freq: Three times a day (TID) | ORAL | 0 refills | Status: DC | PRN

## 2022-04-03 MED FILL — Dexamethasone Sodium Phosphate Inj 100 MG/10ML: INTRAMUSCULAR | Qty: 1 | Status: AC

## 2022-04-06 ENCOUNTER — Inpatient Hospital Stay: Payer: Medicare HMO | Admitting: Internal Medicine

## 2022-04-06 ENCOUNTER — Inpatient Hospital Stay: Payer: Medicare HMO

## 2022-04-06 ENCOUNTER — Encounter: Payer: Self-pay | Admitting: Internal Medicine

## 2022-04-06 DIAGNOSIS — Z452 Encounter for adjustment and management of vascular access device: Secondary | ICD-10-CM | POA: Diagnosis not present

## 2022-04-06 DIAGNOSIS — C182 Malignant neoplasm of ascending colon: Secondary | ICD-10-CM | POA: Diagnosis not present

## 2022-04-06 DIAGNOSIS — Z5111 Encounter for antineoplastic chemotherapy: Secondary | ICD-10-CM | POA: Diagnosis not present

## 2022-04-06 DIAGNOSIS — C786 Secondary malignant neoplasm of retroperitoneum and peritoneum: Secondary | ICD-10-CM | POA: Diagnosis not present

## 2022-04-06 DIAGNOSIS — C7800 Secondary malignant neoplasm of unspecified lung: Secondary | ICD-10-CM | POA: Diagnosis not present

## 2022-04-06 DIAGNOSIS — C787 Secondary malignant neoplasm of liver and intrahepatic bile duct: Secondary | ICD-10-CM | POA: Diagnosis not present

## 2022-04-06 DIAGNOSIS — C7971 Secondary malignant neoplasm of right adrenal gland: Secondary | ICD-10-CM | POA: Diagnosis not present

## 2022-04-06 DIAGNOSIS — Z5112 Encounter for antineoplastic immunotherapy: Secondary | ICD-10-CM | POA: Diagnosis not present

## 2022-04-06 DIAGNOSIS — Z7189 Other specified counseling: Secondary | ICD-10-CM

## 2022-04-06 LAB — CBC WITH DIFFERENTIAL/PLATELET
Abs Immature Granulocytes: 0.01 10*3/uL (ref 0.00–0.07)
Basophils Absolute: 0 10*3/uL (ref 0.0–0.1)
Basophils Relative: 1 %
Eosinophils Absolute: 0.3 10*3/uL (ref 0.0–0.5)
Eosinophils Relative: 8 %
HCT: 33 % — ABNORMAL LOW (ref 39.0–52.0)
Hemoglobin: 10.9 g/dL — ABNORMAL LOW (ref 13.0–17.0)
Immature Granulocytes: 0 %
Lymphocytes Relative: 19 %
Lymphs Abs: 0.8 10*3/uL (ref 0.7–4.0)
MCH: 32.9 pg (ref 26.0–34.0)
MCHC: 33 g/dL (ref 30.0–36.0)
MCV: 99.7 fL (ref 80.0–100.0)
Monocytes Absolute: 0.4 10*3/uL (ref 0.1–1.0)
Monocytes Relative: 10 %
Neutro Abs: 2.6 10*3/uL (ref 1.7–7.7)
Neutrophils Relative %: 62 %
Platelets: 176 10*3/uL (ref 150–400)
RBC: 3.31 MIL/uL — ABNORMAL LOW (ref 4.22–5.81)
RDW: 17.2 % — ABNORMAL HIGH (ref 11.5–15.5)
WBC: 4.2 10*3/uL (ref 4.0–10.5)
nRBC: 0 % (ref 0.0–0.2)

## 2022-04-06 LAB — COMPREHENSIVE METABOLIC PANEL WITH GFR
ALT: 21 U/L (ref 0–44)
AST: 33 U/L (ref 15–41)
Albumin: 2.7 g/dL — ABNORMAL LOW (ref 3.5–5.0)
Alkaline Phosphatase: 160 U/L — ABNORMAL HIGH (ref 38–126)
Anion gap: 7 (ref 5–15)
BUN: 13 mg/dL (ref 8–23)
CO2: 25 mmol/L (ref 22–32)
Calcium: 8.2 mg/dL — ABNORMAL LOW (ref 8.9–10.3)
Chloride: 109 mmol/L (ref 98–111)
Creatinine, Ser: 0.86 mg/dL (ref 0.61–1.24)
GFR, Estimated: >60 mL/min (ref >60)
Glucose, Bld: 136 mg/dL — ABNORMAL HIGH (ref 70–99)
Potassium: 3.7 mmol/L (ref 3.5–5.1)
Sodium: 141 mmol/L (ref 135–145)
Total Bilirubin: 0.6 mg/dL (ref 0.3–1.2)
Total Protein: 6.9 g/dL (ref 6.5–8.1)

## 2022-04-06 LAB — PROTEIN / CREATININE RATIO, URINE
Creatinine, Urine: 345 mg/dL
Protein Creatinine Ratio: 0.08 mg/mg{Cre} (ref 0.00–0.15)
Total Protein, Urine: 26 mg/dL

## 2022-04-06 MED ORDER — SODIUM CHLORIDE 0.9 % IV SOLN
180.0000 mg/m2 | Freq: Once | INTRAVENOUS | Status: AC
  Administered 2022-04-06: 340 mg via INTRAVENOUS
  Filled 2022-04-06: qty 15

## 2022-04-06 MED ORDER — SODIUM CHLORIDE 0.9 % IV SOLN
2400.0000 mg/m2 | INTRAVENOUS | Status: DC
  Administered 2022-04-06: 4500 mg via INTRAVENOUS
  Filled 2022-04-06: qty 90

## 2022-04-06 MED ORDER — SODIUM CHLORIDE 0.9 % IV SOLN
10.0000 mg | Freq: Once | INTRAVENOUS | Status: AC
  Administered 2022-04-06: 10 mg via INTRAVENOUS
  Filled 2022-04-06: qty 10

## 2022-04-06 MED ORDER — PALONOSETRON HCL INJECTION 0.25 MG/5ML
0.2500 mg | Freq: Once | INTRAVENOUS | Status: AC
  Administered 2022-04-06: 0.25 mg via INTRAVENOUS
  Filled 2022-04-06: qty 5

## 2022-04-06 MED ORDER — ATROPINE SULFATE 1 MG/ML IV SOLN
0.4000 mg | Freq: Once | INTRAVENOUS | Status: AC
  Administered 2022-04-06: 0.4 mg via INTRAVENOUS
  Filled 2022-04-06: qty 1

## 2022-04-06 MED ORDER — SODIUM CHLORIDE 0.9 % IV SOLN
5.0000 mg/kg | Freq: Once | INTRAVENOUS | Status: AC
  Administered 2022-04-06: 350 mg via INTRAVENOUS
  Filled 2022-04-06: qty 14

## 2022-04-06 MED ORDER — SODIUM CHLORIDE 0.9% FLUSH
10.0000 mL | Freq: Once | INTRAVENOUS | Status: AC
  Administered 2022-04-06: 10 mL via INTRAVENOUS
  Filled 2022-04-06: qty 10

## 2022-04-06 MED ORDER — SODIUM CHLORIDE 0.9 % IV SOLN
Freq: Once | INTRAVENOUS | Status: AC
  Filled 2022-04-06: qty 250

## 2022-04-06 NOTE — Patient Instructions (Signed)
[facility] CANCER CTR AT [place]  Discharge Instructions: Thank you for choosing [facility] to provide your oncology and hematology care.  If you have a lab appointment with the [facility], please go directly to the [facility] and check in at the registration area.  Wear comfortable clothing and clothing appropriate for easy access to any Portacath or PICC line.   We strive to give you quality time with your provider. You may need to reschedule your appointment if you arrive late (15 or more minutes).  Arriving late affects you and other patients whose appointments are after yours.  Also, if you miss three or more appointments without notifying the office, you may be dismissed from the clinic at the provider's discretion.      For prescription refill requests, have your pharmacy contact our office and allow 72 hours for refills to be completed.    Today you received the following chemotherapy and/or immunotherapy agents ZIRABEV, IRINOTECAN, and 5 FU      To help prevent nausea and vomiting after your treatment, we encourage you to take your nausea medication as directed.  BELOW ARE SYMPTOMS THAT SHOULD BE REPORTED IMMEDIATELY: *FEVER GREATER THAN 100.4 F (38 C) OR HIGHER *CHILLS OR SWEATING *NAUSEA AND VOMITING THAT IS NOT CONTROLLED WITH YOUR NAUSEA MEDICATION *UNUSUAL SHORTNESS OF BREATH *UNUSUAL BRUISING OR BLEEDING *URINARY PROBLEMS (pain or burning when urinating, or frequent urination) *BOWEL PROBLEMS (unusual diarrhea, constipation, pain near the anus) TENDERNESS IN MOUTH AND THROAT WITH OR WITHOUT PRESENCE OF ULCERS (sore throat, sores in mouth, or a toothache) UNUSUAL RASH, SWELLING OR PAIN  UNUSUAL VAGINAL DISCHARGE OR ITCHING   Items with * indicate a potential emergency and should be followed up as soon as possible or go to the Emergency Department if any problems should occur.  Please show the CHEMOTHERAPY ALERT CARD or IMMUNOTHERAPY ALERT  CARD at check-in to the Emergency Department and triage nurse.  Should you have questions after your visit or need to cancel or reschedule your appointment, please contact [facility]CER [place] AT [place]  [phone] and follow the prompts.  Office hours are 8:00 a.m. to 4:30 p.m. Monday - Friday. Please note that voicemails left after 4:00 p.m. may not be returned until the following business day.  We are closed weekends and major holidays. You have access to a nurse at all times for urgent questions. Please call the main number to the clinic [phone] and follow the prompts.  For any non-urgent questions, you may also contact your provider using MyChart. We now offer e-Visits for anyone 28 and older to request care online for non-urgent symptoms. For details visit [email or web address].   Also download the MyChart app! Go to the app store, search "MyChart", open the app, select [facility], and log in with your MyChart username and password.  Masks are optional in the cancer centers. If you would like for your care team to wear a mask while they are taking care of you, please let them know. For doctor visits, patients may have with them one support person who is at least 79 years old. At this time, visitors are not allowed in the infusion area.  Bevacizumab injection What is this medication? BEVACIZUMAB (be va SIZ yoo mab) is a monoclonal antibody. It is used to treat many types of cancer. This medicine may be used for other purposes; ask your health care provider or pharmacist if you have questions. COMMON BRAND NAME(S): Alymsys, Avastin, MVASI, Zirabev What  should I tell my care team before I take this medication? They need to know if you have any of these conditions: diabetes heart disease high blood pressure history of coughing up blood prior anthracycline chemotherapy (e.g., doxorubicin, daunorubicin, epirubicin) recent or ongoing radiation therapy recent or planning  to have surgery stroke an unusual or allergic reaction to bevacizumab, hamster proteins, mouse proteins, other medicines, foods, dyes, or preservatives pregnant or trying to get pregnant breast-feeding How should I use this medication? This medicine is for infusion into a vein. It is given by a health care professional in a hospital or clinic setting. Talk to your pediatrician regarding the use of this medicine in children. Special care may be needed. Overdosage: If you think you have taken too much of this medicine contact a poison control center or emergency room at once. NOTE: This medicine is only for you. Do not share this medicine with others. What if I miss a dose? It is important not to miss your dose. Call your doctor or health care professional if you are unable to keep an appointment. What may interact with this medication? Interactions are not expected. This list may not describe all possible interactions. Give your health care provider a list of all the medicines, herbs, non-prescription drugs, or dietary supplements you use. Also tell them if you smoke, drink alcohol, or use illegal drugs. Some items may interact with your medicine. What should I watch for while using this medication? Your condition will be monitored carefully while you are receiving this medicine. You will need important blood work and urine testing done while you are taking this medicine. This medicine may increase your risk to bruise or bleed. Call your doctor or health care professional if you notice any unusual bleeding. Before having surgery, talk to your health care provider to make sure it is ok. This drug can increase the risk of poor healing of your surgical site or wound. You will need to stop this drug for 28 days before surgery. After surgery, wait at least 28 days before restarting this drug. Make sure the surgical site or wound is healed enough before restarting this drug. Talk to your health care  provider if questions. Do not become pregnant while taking this medicine or for 6 months after stopping it. Women should inform their doctor if they wish to become pregnant or think they might be pregnant. There is a potential for serious side effects to an unborn child. Talk to your health care professional or pharmacist for more information. Do not breast-feed an infant while taking this medicine and for 6 months after the last dose. This medicine has caused ovarian failure in some women. This medicine may interfere with the ability to have a child. You should talk to your doctor or health care professional if you are concerned about your fertility. What side effects may I notice from receiving this medication? Side effects that you should report to your doctor or health care professional as soon as possible: allergic reactions like skin rash, itching or hives, swelling of the face, lips, or tongue chest pain or chest tightness chills coughing up blood high fever seizures severe constipation signs and symptoms of bleeding such as bloody or black, tarry stools; red or dark-brown urine; spitting up blood or brown material that looks like coffee grounds; red spots on the skin; unusual bruising or bleeding from the eye, gums, or nose signs and symptoms of a blood clot such as breathing problems; chest pain; severe,  sudden headache; pain, swelling, warmth in the leg signs and symptoms of a stroke like changes in vision; confusion; trouble speaking or understanding; severe headaches; sudden numbness or weakness of the face, arm or leg; trouble walking; dizziness; loss of balance or coordination stomach pain sweating swelling of legs or ankles vomiting weight gain Side effects that usually do not require medical attention (report to your doctor or health care professional if they continue or are bothersome): back pain changes in taste decreased appetite dry skin nausea tiredness This list may  not describe all possible side effects. Call your doctor for medical advice about side effects. You may report side effects to FDA at 1-800-FDA-1088. Where should I keep my medication? This drug is given in a hospital or clinic and will not be stored at home. NOTE: This sheet is a summary. It may not cover all possible information. If you have questions about this medicine, talk to your doctor, pharmacist, or health care provider.  2023 Elsevier/Gold Standard (2021-09-05 00:00:00)  Irinotecan injection What is this medication? IRINOTECAN (ir in oh TEE kan ) is a chemotherapy drug. It is used to treat colon and rectal cancer. This medicine may be used for other purposes; ask your health care provider or pharmacist if you have questions. COMMON BRAND NAME(S): Camptosar What should I tell my care team before I take this medication? They need to know if you have any of these conditions: dehydration diarrhea infection (especially a virus infection such as chickenpox, cold sores, or herpes) liver disease low blood counts, like low white cell, platelet, or red cell counts low levels of calcium, magnesium, or potassium in the blood recent or ongoing radiation therapy an unusual or allergic reaction to irinotecan, other medicines, foods, dyes, or preservatives pregnant or trying to get pregnant breast-feeding How should I use this medication? This drug is given as an infusion into a vein. It is administered in a hospital or clinic by a specially trained health care professional. Talk to your pediatrician regarding the use of this medicine in children. Special care may be needed. Overdosage: If you think you have taken too much of this medicine contact a poison control center or emergency room at once. NOTE: This medicine is only for you. Do not share this medicine with others. What if I miss a dose? It is important not to miss your dose. Call your doctor or health care professional if you are  unable to keep an appointment. What may interact with this medication? Do not take this medicine with any of the following medications: cobicistat itraconazole This medicine may interact with the following medications: antiviral medicines for HIV or AIDS certain antibiotics like rifampin or rifabutin certain medicines for fungal infections like ketoconazole, posaconazole, and voriconazole certain medicines for seizures like carbamazepine, phenobarbital, phenotoin clarithromycin gemfibrozil nefazodone St. John's Wort This list may not describe all possible interactions. Give your health care provider a list of all the medicines, herbs, non-prescription drugs, or dietary supplements you use. Also tell them if you smoke, drink alcohol, or use illegal drugs. Some items may interact with your medicine. What should I watch for while using this medication? Your condition will be monitored carefully while you are receiving this medicine. You will need important blood work done while you are taking this medicine. This drug may make you feel generally unwell. This is not uncommon, as chemotherapy can affect healthy cells as well as cancer cells. Report any side effects. Continue your course of treatment even though  you feel ill unless your doctor tells you to stop. In some cases, you may be given additional medicines to help with side effects. Follow all directions for their use. You may get drowsy or dizzy. Do not drive, use machinery, or do anything that needs mental alertness until you know how this medicine affects you. Do not stand or sit up quickly, especially if you are an older patient. This reduces the risk of dizzy or fainting spells. Call your health care professional for advice if you get a fever, chills, or sore throat, or other symptoms of a cold or flu. Do not treat yourself. This medicine decreases your body's ability to fight infections. Try to avoid being around people who are  sick. Avoid taking products that contain aspirin, acetaminophen, ibuprofen, naproxen, or ketoprofen unless instructed by your doctor. These medicines may hide a fever. This medicine may increase your risk to bruise or bleed. Call your doctor or health care professional if you notice any unusual bleeding. Be careful brushing and flossing your teeth or using a toothpick because you may get an infection or bleed more easily. If you have any dental work done, tell your dentist you are receiving this medicine. Do not become pregnant while taking this medicine or for 6 months after stopping it. Women should inform their health care professional if they wish to become pregnant or think they might be pregnant. Men should not father a child while taking this medicine and for 3 months after stopping it. There is potential for serious side effects to an unborn child. Talk to your health care professional for more information. Do not breast-feed an infant while taking this medicine or for 7 days after stopping it. This medicine has caused ovarian failure in some women. This medicine may make it more difficult to get pregnant. Talk to your health care professional if you are concerned about your fertility. This medicine has caused decreased sperm counts in some men. This may make it more difficult to father a child. Talk to your health care professional if you are concerned about your fertility. What side effects may I notice from receiving this medication? Side effects that you should report to your doctor or health care professional as soon as possible: allergic reactions like skin rash, itching or hives, swelling of the face, lips, or tongue chest pain diarrhea flushing, runny nose, sweating during infusion low blood counts - this medicine may decrease the number of white blood cells, red blood cells and platelets. You may be at increased risk for infections and bleeding. nausea, vomiting pain, swelling,  warmth in the leg signs of decreased platelets or bleeding - bruising, pinpoint red spots on the skin, black, tarry stools, blood in the urine signs of infection - fever or chills, cough, sore throat, pain or difficulty passing urine signs of decreased red blood cells - unusually weak or tired, fainting spells, lightheadedness Side effects that usually do not require medical attention (report to your doctor or health care professional if they continue or are bothersome): constipation hair loss headache loss of appetite mouth sores stomach pain This list may not describe all possible side effects. Call your doctor for medical advice about side effects. You may report side effects to FDA at 1-800-FDA-1088. Where should I keep my medication? This drug is given in a hospital or clinic and will not be stored at home. NOTE: This sheet is a summary. It may not cover all possible information. If you have questions about this  medicine, talk to your doctor, pharmacist, or health care provider.  2023 Elsevier/Gold Standard (2021-09-05 00:00:00)  Fluorouracil, 5-FU injection What is this medication? FLUOROURACIL, 5-FU (flure oh YOOR a sil) is a chemotherapy drug. It slows the growth of cancer cells. This medicine is used to treat many types of cancer like breast cancer, colon or rectal cancer, pancreatic cancer, and stomach cancer. This medicine may be used for other purposes; ask your health care provider or pharmacist if you have questions. COMMON BRAND NAME(S): Adrucil What should I tell my care team before I take this medication? They need to know if you have any of these conditions: blood disorders dihydropyrimidine dehydrogenase (DPD) deficiency infection (especially a virus infection such as chickenpox, cold sores, or herpes) kidney disease liver disease malnourished, poor nutrition recent or ongoing radiation therapy an unusual or allergic reaction to fluorouracil, other chemotherapy,  other medicines, foods, dyes, or preservatives pregnant or trying to get pregnant breast-feeding How should I use this medication? This drug is given as an infusion or injection into a vein. It is administered in a hospital or clinic by a specially trained health care professional. Talk to your pediatrician regarding the use of this medicine in children. Special care may be needed. Overdosage: If you think you have taken too much of this medicine contact a poison control center or emergency room at once. NOTE: This medicine is only for you. Do not share this medicine with others. What if I miss a dose? It is important not to miss your dose. Call your doctor or health care professional if you are unable to keep an appointment. What may interact with this medication? Do not take this medicine with any of the following medications: live virus vaccines This medicine may also interact with the following medications: medicines that treat or prevent blood clots like warfarin, enoxaparin, and dalteparin This list may not describe all possible interactions. Give your health care provider a list of all the medicines, herbs, non-prescription drugs, or dietary supplements you use. Also tell them if you smoke, drink alcohol, or use illegal drugs. Some items may interact with your medicine. What should I watch for while using this medication? Visit your doctor for checks on your progress. This drug may make you feel generally unwell. This is not uncommon, as chemotherapy can affect healthy cells as well as cancer cells. Report any side effects. Continue your course of treatment even though you feel ill unless your doctor tells you to stop. In some cases, you may be given additional medicines to help with side effects. Follow all directions for their use. Call your doctor or health care professional for advice if you get a fever, chills or sore throat, or other symptoms of a cold or flu. Do not treat yourself.  This drug decreases your body's ability to fight infections. Try to avoid being around people who are sick. This medicine may increase your risk to bruise or bleed. Call your doctor or health care professional if you notice any unusual bleeding. Be careful brushing and flossing your teeth or using a toothpick because you may get an infection or bleed more easily. If you have any dental work done, tell your dentist you are receiving this medicine. Avoid taking products that contain aspirin, acetaminophen, ibuprofen, naproxen, or ketoprofen unless instructed by your doctor. These medicines may hide a fever. Do not become pregnant while taking this medicine. Women should inform their doctor if they wish to become pregnant or think they might be  pregnant. There is a potential for serious side effects to an unborn child. Talk to your health care professional or pharmacist for more information. Do not breast-feed an infant while taking this medicine. Men should inform their doctor if they wish to father a child. This medicine may lower sperm counts. Do not treat diarrhea with over the counter products. Contact your doctor if you have diarrhea that lasts more than 2 days or if it is severe and watery. This medicine can make you more sensitive to the sun. Keep out of the sun. If you cannot avoid being in the sun, wear protective clothing and use sunscreen. Do not use sun lamps or tanning beds/booths. What side effects may I notice from receiving this medication? Side effects that you should report to your doctor or health care professional as soon as possible: allergic reactions like skin rash, itching or hives, swelling of the face, lips, or tongue low blood counts - this medicine may decrease the number of white blood cells, red blood cells and platelets. You may be at increased risk for infections and bleeding. signs of infection - fever or chills, cough, sore throat, pain or difficulty passing urine signs  of decreased platelets or bleeding - bruising, pinpoint red spots on the skin, black, tarry stools, blood in the urine signs of decreased red blood cells - unusually weak or tired, fainting spells, lightheadedness breathing problems changes in vision chest pain mouth sores nausea and vomiting pain, swelling, redness at site where injected pain, tingling, numbness in the hands or feet redness, swelling, or sores on hands or feet stomach pain unusual bleeding Side effects that usually do not require medical attention (report to your doctor or health care professional if they continue or are bothersome): changes in finger or toe nails diarrhea dry or itchy skin hair loss headache loss of appetite sensitivity of eyes to the light stomach upset unusually teary eyes This list may not describe all possible side effects. Call your doctor for medical advice about side effects. You may report side effects to FDA at 1-800-FDA-1088. Where should I keep my medication? This drug is given in a hospital or clinic and will not be stored at home. NOTE: This sheet is a summary. It may not cover all possible information. If you have questions about this medicine, talk to your doctor, pharmacist, or health care provider.  2023 Elsevier/Gold Standard (2021-09-05 00:00:00)

## 2022-04-06 NOTE — Assessment & Plan Note (Addendum)
#  Right-sided colon adenocarcinoma-with synchronous metastasis to liver/unresectable;  CT scan April 2023- Interval progression of metastatic disease in the chest, abdomen,and pelvis ; Interval development of numerous new bilateral pulmonary nodules with progression of pre-existing pulmonary nodules. Interval progression of mediastinal and hilar lymphadenopathy;Interval development of multiple new liver metastases since the previous CT scan.  Interval progression of the right adrenal metastatic lesion with progression of the amorphous mass just inferior to the anastomosis and just anterior to the lower pole right kidney.  Metastatic lesion in the upper pole right kidney is progressive and now peers to invade the right renal vein to the level of the confluence with the IVC.  Interval progression of omental, gastrocolic ligament, and mesenteric metastases.  Currently on FOLFIRI+ Zira-Bev. S/p Evaluation at [facility] opinion/reviewed the note from [facility]; and also discussed with Dr Dennison Nancy, who agrees with current treatment plan.   #Proceed with cycle #5 of FOLFIRI plus Bev.  labs today reviewed;  acceptable for treatment today. CEA trending down. Will plan to scan after  6 cycles of chemo.  Will order imaging today.  # Nausea/vomitting- sec to chemo G-1; zofran prn. STABLE.   # Right upper quadrant pain-secondary to malignancy-s/p evaluation with [name]; continue oxycodone 5 mg up to 1-2 pills a day as needed.   STABLE.   # Proteinuria: UA +100 protein: JUNE 2023 protein: creatinine ratio:- 0.07 Okay to proceed with BeV.   #Weight loss: sec to malignancy; appetite improved; monitor for now- worse; s/p evaluation with [facility]. STABLE.   # Genetic counseling: 2 brother/sister; 2 biological children. Liquid biopsy/guardant testing/at [facility] [May 2023]-"abnormal" [report not available to me]; awaiting appt with genetic counseling on 6/28. .   # Mediport- functioning-STABLE>  # DISPOSITION:  # Chemo  today; pump off on D-3 # follow up in 2 weeks-  MD; CT CAP ;labs- cbc/cmp CEA ;urine protein creatinine ratio;  FOLFIRI +Zira-Bev; pump off D-3;-Dr.B

## 2022-04-06 NOTE — Progress Notes (Signed)
[facility] NOTE  Patient Care Team: Sofie Hartigan, MD as PCP - General (Family Medicine) Cammie Sickle, MD as Consulting Physician (Hematology and Oncology)  CHIEF COMPLAINTS/PURPOSE OF CONSULTATION: Colon cancer  #  Oncology History Overview Note  # MAY 2020- 3. 03/09/19 Liver biopsy. Microscopic examination shows malignant cells with glandular architecture consistent with adenocarcinoma. The malignant cells are positive for CK20 and CDX-2. These findings support the clinical impression of metastatic colon adenocarcinoma. 4. 03/10/19 R hemicolectomy. Tumor site cecum. Adenocarcinoma. Mucinous features present. G2. No tumor deposits. Invades visceral peritoneum. No tumor perforation. LVI present. PNI not identified. All margins uninvolved. 1/12 LNs. PT4apN1. Periappendiceal inflammation c/w resolving abscess. Microsatellite stable (MSS). [Dr.Mettu; [facility]]  # SEP 4th 2020 [compared to May 2020]  Interval increase in size of the metastases to the hepatic dome, The metastasis to the left hepatic lobe is unchanged; 2.  New subcentimeter hypoattenuating lesion in the inferior right hepatic lobe, incompletely characterized on CT. 3.  Postsurgical changes following right hemicolectomy.  # OCT 2020- FOLFOX +avastin; CT dec 22nd 2020- [compared to [facility] sep 9th 2020]-Liver- slight progression versus stable disease; CT scan SEP 4th 2021- Progressive disease-left lower lobe lung nodule 8 mm [previously 4 mm]; increase in size of the hepatic metastases by few millimeters.;  Soft tissue nodule adjacent to anastomotic site again increased by few millimeters.STOP FOLFOX; cont avastin  #  SEP 20th, 2021- FOLFIRI+ AVASTIN; HOLD FEB 2022- sec to poor tolerance [peptic ulcer]; 3/30-liver ablation-   [bland embolization of segment 2/3 liver lesion on 12/19/2020;  microwave ablation of both liver lesions.  # ?July 25th, 2022- BEV _ TAS 102.MAY 2022- s/p Liver ablation  # DEC  2022-progressive disease-lung and liver/abdomen; JAN 31st 2023-START Regorefenib.   # FEB /16-EGD [Dr.Anna-duodenal ulcer-? meloxicam]  # JAN 2022- COVID [s/p Mab infusion; pills; skin rash resolved.]  # NGS/F-ONE-MUTATED K-RAS [G]       Cancer of right colon ([place])  07/05/2019 Initial Diagnosis   Cancer of right colon ([place])   07/24/2019 - 06/25/2020 Chemotherapy   The patient had dexamethasone (DECADRON) 4 MG tablet, 8 mg, Oral, Daily, 1 of 1 cycle, Start date: --, End date: -- palonosetron (ALOXI) injection 0.25 mg, 0.25 mg, Intravenous,  Once, 23 of 25 cycles Administration: 0.25 mg (07/24/2019), 0.25 mg (08/07/2019), 0.25 mg (08/21/2019), 0.25 mg (09/04/2019), 0.25 mg (09/20/2019), 0.25 mg (10/04/2019), 0.25 mg (10/16/2019), 0.25 mg (10/30/2019), 0.25 mg (11/22/2019), 0.25 mg (12/06/2019), 0.25 mg (12/20/2019), 0.25 mg (01/03/2020), 0.25 mg (01/29/2020), 0.25 mg (02/12/2020), 0.25 mg (02/26/2020), 0.25 mg (03/11/2020), 0.25 mg (03/25/2020), 0.25 mg (04/08/2020), 0.25 mg (04/24/2020), 0.25 mg (05/08/2020), 0.25 mg (05/22/2020), 0.25 mg (06/10/2020), 0.25 mg (06/25/2020) leucovorin 800 mg in dextrose 5 % 250 mL infusion, 844 mg, Intravenous,  Once, 23 of 25 cycles Administration: 800 mg (07/24/2019), 800 mg (08/07/2019), 800 mg (08/21/2019), 800 mg (09/04/2019), 800 mg (09/20/2019), 800 mg (10/04/2019), 800 mg (10/16/2019), 800 mg (10/30/2019), 800 mg (11/22/2019), 800 mg (12/06/2019), 800 mg (12/20/2019), 800 mg (01/03/2020), 800 mg (01/29/2020), 800 mg (02/12/2020), 800 mg (02/26/2020), 800 mg (03/11/2020), 800 mg (03/25/2020), 800 mg (04/08/2020), 800 mg (04/24/2020), 800 mg (05/08/2020), 800 mg (05/22/2020), 800 mg (06/10/2020), 800 mg (06/25/2020) oxaliplatin (ELOXATIN) 180 mg in dextrose 5 % 500 mL chemo infusion, 85 mg/m2 = 180 mg, Intravenous,  Once, 23 of 25 cycles Dose modification: 178 mg (original dose 85 mg/m2, Cycle 17, Reason: Other (see comments), Comment: insurance adjusted dose ) Administration: 180 mg (07/24/2019), 180  mg  (08/07/2019), 180 mg (08/21/2019), 180 mg (09/04/2019), 180 mg (09/20/2019), 180 mg (10/04/2019), 180 mg (10/16/2019), 180 mg (10/30/2019), 180 mg (11/22/2019), 180 mg (12/06/2019), 180 mg (12/20/2019), 180 mg (01/03/2020), 180 mg (01/29/2020), 180 mg (02/12/2020), 180 mg (02/26/2020), 180 mg (03/11/2020), 180 mg (04/08/2020), 180 mg (04/24/2020), 180 mg (05/08/2020), 180 mg (05/22/2020), 180 mg (06/10/2020), 180 mg (06/25/2020) fluorouracil (ADRUCIL) 5,000 mg in sodium chloride 0.9 % 150 mL chemo infusion, 5,050 mg, Intravenous, 1 Day/Dose, 23 of 25 cycles Administration: 5,000 mg (07/24/2019), 5,000 mg (08/07/2019), 5,000 mg (08/21/2019), 5,050 mg (09/04/2019), 5,000 mg (10/04/2019), 5,000 mg (10/16/2019), 5,000 mg (11/22/2019), 5,000 mg (12/06/2019), 5,000 mg (12/20/2019), 5,000 mg (01/03/2020), 5,000 mg (01/29/2020), 5,000 mg (02/12/2020), 5,000 mg (02/26/2020), 5,000 mg (03/11/2020), 5,000 mg (03/25/2020), 5,000 mg (04/08/2020), 5,000 mg (04/24/2020), 5,000 mg (05/08/2020), 5,000 mg (05/22/2020), 5,000 mg (06/10/2020), 5,000 mg (06/25/2020) bevacizumab-bvzr (ZIRABEV) 400 mg in sodium chloride 0.9 % 100 mL chemo infusion, 5 mg/kg = 400 mg, Intravenous,  Once, 23 of 25 cycles Administration: 400 mg (08/07/2019), 400 mg (08/21/2019), 400 mg (09/04/2019), 400 mg (09/20/2019), 400 mg (10/04/2019), 400 mg (10/16/2019), 400 mg (10/30/2019), 400 mg (11/22/2019), 400 mg (12/06/2019), 400 mg (12/20/2019), 400 mg (01/03/2020), 400 mg (01/29/2020), 400 mg (02/12/2020), 400 mg (02/26/2020), 400 mg (03/11/2020), 400 mg (03/25/2020), 400 mg (04/08/2020), 400 mg (04/24/2020), 400 mg (05/08/2020), 400 mg (05/22/2020), 400 mg (06/10/2020), 400 mg (06/25/2020)  for chemotherapy treatment.    07/08/2020 -  Chemotherapy   Patient is on Treatment Plan : COLORECTAL FOLFIRI / BEVACIZUMAB Q14D     11/05/2020 Cancer Staging   Staging form: Colon and Rectum, AJCC 8th Edition - Clinical: Stage IVC (pM1c) - Signed by Cammie Sickle, MD on 11/05/2020    HISTORY OF PRESENTING ILLNESS: He is  alone.  Ambulating independently.  Caro Hight 81 y.o.  male with metastatic colon cancer to the liver-s/p multiple lines of therapy -currently on FOLFIRI + Bev is here follow-up.  Patient is currently s/p chemotherapy cycle #4 .  Complains of mild nausea/vomiting couple of days after chemotherapy currently resolved.  No diarrhea.  No worsening diarrhea.  Chronic mild abdominal pain for which he has been taking oxycodone 5 mg up to 2-3 pill a day.   No sores in the mouth.  No rash on palms and soles.    Patient has lost weight.  However claims to have good appetite.  Review of Systems  Constitutional:  Positive for malaise/fatigue. Negative for chills, diaphoresis and fever.  HENT:  Negative for nosebleeds and sore throat.   Eyes:  Negative for double vision.  Respiratory:  Negative for cough, hemoptysis, sputum production, shortness of breath and wheezing.   Cardiovascular:  Negative for chest pain, palpitations, orthopnea and leg swelling.  Gastrointestinal:  Negative for blood in stool, constipation, heartburn, melena, nausea and vomiting.  Genitourinary:  Negative for dysuria, frequency and urgency.  Musculoskeletal:  Positive for back pain and joint pain.  Skin: Negative.  Negative for itching and rash.  Neurological:  Positive for tingling. Negative for focal weakness, weakness and headaches.  Endo/Heme/Allergies:  Does not bruise/bleed easily.  Psychiatric/Behavioral:  Negative for depression. The patient is not nervous/anxious and does not have insomnia.      MEDICAL HISTORY:  Past Medical History:  Diagnosis Date   Arthritis    Cancer ([place])    colon cancer 02/2019 per pt    Family history of adverse reaction to anesthesia    cousin took all night to wake  up from anesthesia   H/O colon cancer, stage IV    Hyperlipemia    Neuromuscular disorder ([place])    Pre-diabetes     SURGICAL HISTORY: Past Surgical History:  Procedure Laterality Date   COLON SURGERY      ESOPHAGOGASTRODUODENOSCOPY (EGD) WITH PROPOFOL N/A 12/26/2020   Procedure: ESOPHAGOGASTRODUODENOSCOPY (EGD) WITH PROPOFOL;  Surgeon: [name], MD;  Location: [facility] ENDOSCOPY;  Service: Gastroenterology;  Laterality: N/A;   IR ANGIOGRAM SELECTIVE EACH ADDITIONAL VESSEL  12/19/2020   IR ANGIOGRAM SELECTIVE EACH ADDITIONAL VESSEL  03/24/2021   IR ANGIOGRAM SELECTIVE EACH ADDITIONAL VESSEL  03/24/2021   IR ANGIOGRAM SELECTIVE EACH ADDITIONAL VESSEL  04/04/2021   IR ANGIOGRAM SELECTIVE EACH ADDITIONAL VESSEL  04/04/2021   IR ANGIOGRAM SELECTIVE EACH ADDITIONAL VESSEL  04/04/2021   IR ANGIOGRAM VISCERAL SELECTIVE  12/19/2020   IR ANGIOGRAM VISCERAL SELECTIVE  03/24/2021   IR ANGIOGRAM VISCERAL SELECTIVE  04/04/2021   IR ANGIOGRAM VISCERAL SELECTIVE  04/04/2021   IR EMBO ARTERIAL NOT HEMORR HEMANG INC GUIDE ROADMAPPING  03/24/2021   IR EMBO TUMOR ORGAN ISCHEMIA INFARCT INC GUIDE ROADMAPPING  12/19/2020   IR EMBO TUMOR ORGAN ISCHEMIA INFARCT INC GUIDE ROADMAPPING  04/04/2021   IR IMAGING GUIDED PORT INSERTION  07/20/2019   IR RADIOLOGIST EVAL & MGMT  12/05/2020   IR RADIOLOGIST EVAL & MGMT  02/11/2021   IR RADIOLOGIST EVAL & MGMT  03/04/2021   IR RADIOLOGIST EVAL & MGMT  04/29/2021   IR RADIOLOGIST EVAL & MGMT  07/22/2021   IR RADIOLOGIST EVAL & MGMT  11/19/2021   IR US GUIDE VASC ACCESS RIGHT  12/19/2020   IR US GUIDE VASC ACCESS RIGHT  03/24/2021   IR US GUIDE VASC ACCESS RIGHT  04/04/2021   JOINT REPLACEMENT Left 2010   Knee   RADIOLOGY WITH ANESTHESIA N/A 01/15/2021   Procedure: CT WITH ANESTHESIA MICROWAVE ABLATION OF LIVER;  Surgeon: Criselda Peaches, MD;  Location: [facility] ORS;  Service: Anesthesiology;  Laterality: N/A;    SOCIAL HISTORY: Social History   Socioeconomic History   Marital status: Married    Spouse name: Not on file   Number of children: Not on file   Years of education: Not on file   Highest education level: Not on file  Occupational History   Not on file  Tobacco Use   Smoking status: Every  Day    Packs/day: 0.25    Years: 15.00    Total pack years: 3.75    Types: Cigarettes   Smokeless tobacco: Never   Tobacco comments:    1 to 2 cigarettes a day occasionally  Vaping Use   Vaping Use: Never used  Substance and Sexual Activity   Alcohol use: Yes    Comment: rarely    Drug use: No   Sexual activity: Not on file  Other Topics Concern   Not on file  Social History Narrative   Recruitment consultant retd; lives in [place]; smoking 3cig/day; [3/4 ppd x started at 9 years]; no alcohol. Son & daughter; wife dementia [waiting for placement].    Social Determinants of Health   Financial Resource Strain: Not on file  Food Insecurity: Not on file  Transportation Needs: Not on file  Physical Activity: Not on file  Stress: Not on file  Social Connections: Not on file  Intimate Partner Violence: Not on file    FAMILY HISTORY: Family History  Problem Relation Age of Onset   Peptic Ulcer Disease Father     ALLERGIES:  is allergic to ace inhibitors.  MEDICATIONS:  Current Outpatient Medications  Medication Sig Dispense Refill   oxyCODONE (OXY IR/ROXICODONE) 5 MG immediate release tablet Take 1 tablet (5 mg total) by mouth every 8 (eight) hours as needed for severe pain. 60 tablet 0   amLODipine (NORVASC) 5 MG tablet Take 5 mg by mouth daily. (Patient not taking: Reported on 10/03/2021)     diphenoxylate-atropine (LOMOTIL) 2.5-0.025 MG tablet Take 1 tablet by mouth 4 (four) times daily as needed for diarrhea or loose stools. Take it along with immodium (Patient not taking: Reported on 02/23/2022) 60 tablet 0   ondansetron (ZOFRAN) 8 MG tablet Take 1 tablet (8 mg total) by mouth every 8 (eight) hours as needed for nausea or vomiting. (Patient not taking: Reported on 03/09/2022) 30 tablet 0   polyethylene glycol powder (GLYCOLAX/MIRALAX) 17 GM/SCOOP powder Take by mouth as needed. (Patient not taking: Reported on 12/29/2021)     pravastatin (PRAVACHOL) 40 MG tablet Take  40 mg by mouth daily.  (Patient not taking: Reported on 06/09/2021)     predniSONE (DELTASONE) 20 MG tablet Take 1 tablet (20 mg total) by mouth daily with breakfast. (Patient not taking: Reported on 02/06/2022) 30 tablet 1   prochlorperazine (COMPAZINE) 10 MG tablet Take 1 tablet (10 mg total) by mouth every 6 (six) hours as needed for nausea or vomiting. (Patient not taking: Reported on 03/09/2022) 60 tablet 0   regorafenib (STIVARGA) 40 MG tablet Take 2 tablets (80 mg total) by mouth daily with breakfast. Take for 21 days, then hold for 7 days. Repeat every 28 days. (Patient not taking: Reported on 02/06/2022) 42 tablet 1   senna (SENOKOT) 8.6 MG TABS tablet Take 1 tablet (8.6 mg total) by mouth daily. (Patient not taking: Reported on 12/29/2021) 120 tablet 0   sucralfate (CARAFATE) 1 g tablet Take 1 tablet (1 g total) by mouth 4 (four) times daily -  with meals and at bedtime. (Patient not taking: Reported on 11/18/2021) 90 tablet 3   tamsulosin (FLOMAX) 0.4 MG CAPS capsule Take 1 capsule (0.4 mg total) by mouth daily. (Patient not taking: Reported on 04/06/2022) 90 capsule 1   No current facility-administered medications for this visit.   Facility-Administered Medications Ordered in Other Visits  Medication Dose Route Frequency Provider Last Rate Last Admin   atropine injection 0.4 mg  0.4 mg Intravenous Once Borders, Kirt Boys, NP       bevacizumab-bvzr (ZIRABEV) 350 mg in sodium chloride 0.9 % 100 mL chemo infusion  5 mg/kg (Order-Specific) Intravenous Once Cammie Sickle, MD       fluorouracil (ADRUCIL) 4,500 mg in sodium chloride 0.9 % 60 mL chemo infusion  2,400 mg/m2 (Order-Specific) Intravenous 1 day or 1 dose Charlaine Dalton R, MD       irinotecan (CAMPTOSAR) 340 mg in sodium chloride 0.9 % 500 mL chemo infusion  180 mg/m2 (Order-Specific) Intravenous Once Cammie Sickle, MD          .  PHYSICAL EXAMINATION: ECOG PERFORMANCE STATUS: 0 - Asymptomatic  Vitals:   04/06/22  0820  BP: 130/79  Pulse: 72  Temp: 97.7 F (36.5 C)  SpO2: 100%    Filed Weights   04/06/22 0820  Weight: 150 lb 3.2 oz (68.1 kg)     Physical Exam HENT:     Head: Normocephalic and atraumatic.     Mouth/Throat:     Pharynx: No oropharyngeal exudate.  Eyes:     Pupils: Pupils are equal,  round, and reactive to light.  Cardiovascular:     Rate and Rhythm: Normal rate and regular rhythm.  Pulmonary:     Effort: No respiratory distress.     Breath sounds: No wheezing.     Comments: Decreased breath sounds bilaterally.  No wheeze or crackles Abdominal:     General: Bowel sounds are normal. There is no distension.     Palpations: Abdomen is soft. There is no mass.     Tenderness: There is no abdominal tenderness. There is no guarding or rebound.  Musculoskeletal:        General: No tenderness. Normal range of motion.     Cervical back: Normal range of motion and neck supple.  Skin:    General: Skin is warm.  Neurological:     Mental Status: He is alert and oriented to person, place, and time.  Psychiatric:        Mood and Affect: Affect normal.    LABORATORY DATA:  I have reviewed the data as listed Lab Results  Component Value Date   WBC 4.2 04/06/2022   HGB 10.9 (L) 04/06/2022   HCT 33.0 (L) 04/06/2022   MCV 99.7 04/06/2022   PLT 176 04/06/2022   Recent Labs    03/09/22 0916 03/23/22 0813 04/06/22 0829  NA 137 138 141  K 3.8 3.6 3.7  CL 107 106 109  CO2 _0 GLUCOSE 156* 134* 136*  BUN _1 CREATININE 0.70 0.84 0.86  CALCIUM 7.8* 8.2* 8.2*  GFRNONAA >60 >60 >60  PROT 7.3 7.5 6.9  ALBUMIN 2.4* 2.7* 2.7*  AST 37 36 33  ALT _2 ALKPHOS 176* 188* 160*  BILITOT 0.6 0.6 0.6    RADIOGRAPHIC STUDIES: I have personally reviewed the radiological images as listed and agreed with the findings in the report. No results found.   Latest Reference Range & Units Most Recent 06/09/21 08:30 06/25/21 10:24 07/07/21 09:06 07/21/21 08:32 08/04/21  08:54 08/18/21 08:31 09/01/21 08:47 09/25/21 08:29 10/28/21 10:14 11/18/21 14:20 12/08/21 09:06  CEA 0.0 - 4.7 ng/mL 301.0 (H) 12/08/21 09:06 91.6 (H) 84.1 (H) 87.6 (H) 89.1 (H) 108.0 (H) 108.0 (H) 107.0 (H) 105.0 (H) 119.0 (H) 177.0 (H) 301.0 (H)  (H): Data is abnormally high  ASSESSMENT & PLAN:   Cancer of right colon ([facility]) #Right-sided colon adenocarcinoma-with synchronous metastasis to liver/unresectable;  CT scan April 2023- Interval progression of metastatic disease in the chest, abdomen,and pelvis ; Interval development of numerous new bilateral pulmonary nodules with progression of pre-existing pulmonary nodules. Interval progression of mediastinal and hilar lymphadenopathy;Interval development of multiple new liver metastases since the previous CT scan.  Interval progression of the right adrenal metastatic lesion with progression of the amorphous mass just inferior to the anastomosis and just anterior to the lower pole right kidney.  Metastatic lesion in the upper pole right kidney is progressive and now peers to invade the right renal vein to the level of the confluence with the IVC.  Interval progression of omental, gastrocolic ligament, and mesenteric metastases.  Currently on FOLFIRI+ Zira-Bev. S/p Evaluation at [facility] opinion/reviewed the note from [facility]; and also discussed with Dr Dennison Nancy, who agrees with current treatment plan.   #Proceed with cycle #5 of FOLFIRI plus Bev.  labs today reviewed;  acceptable for treatment today. CEA trending down. Will plan to scan after  6 cycles of chemo.  Will order imaging today.  # Nausea/vomitting- sec to chemo G-1; zofran prn. STABLE.   #  Right upper quadrant pain-secondary to malignancy-s/p evaluation with [name]; continue oxycodone 5 mg up to 1-2 pills a day as needed.   STABLE.   # Proteinuria: UA +100 protein: JUNE 2023 protein: creatinine ratio:- 0.07 Okay to proceed with BeV.   #Weight loss: sec to malignancy; appetite  improved; monitor for now- worse; s/p evaluation with [facility]. STABLE.   # Genetic counseling: 2 brother/sister; 2 biological children. Liquid biopsy/guardant testing/at [facility] [May 2023]-"abnormal" [report not available to me]; awaiting appt with genetic counseling on 6/28. .   # Mediport- functioning-STABLE>  # DISPOSITION:  # Chemo today; pump off on D-3 # follow up in 2 weeks-  MD; CT CAP ;labs- cbc/cmp CEA ;urine protein creatinine ratio;  FOLFIRI +Zira-Bev; pump off D-3;-Dr.B           All questions were answered. The patient knows to call the clinic with any problems, questions or concerns.    Cammie Sickle, MD 04/06/2022 9:49 AM

## 2022-04-07 LAB — CEA: CEA: 138 ng/mL — ABNORMAL HIGH (ref 0.0–4.7)

## 2022-04-08 ENCOUNTER — Inpatient Hospital Stay: Payer: Medicare HMO

## 2022-04-08 VITALS — BP 144/82 | HR 68 | Resp 16

## 2022-04-08 DIAGNOSIS — Z5112 Encounter for antineoplastic immunotherapy: Secondary | ICD-10-CM | POA: Diagnosis not present

## 2022-04-08 DIAGNOSIS — C7800 Secondary malignant neoplasm of unspecified lung: Secondary | ICD-10-CM | POA: Diagnosis not present

## 2022-04-08 DIAGNOSIS — C786 Secondary malignant neoplasm of retroperitoneum and peritoneum: Secondary | ICD-10-CM | POA: Diagnosis not present

## 2022-04-08 DIAGNOSIS — Z5111 Encounter for antineoplastic chemotherapy: Secondary | ICD-10-CM | POA: Diagnosis not present

## 2022-04-08 DIAGNOSIS — Z452 Encounter for adjustment and management of vascular access device: Secondary | ICD-10-CM | POA: Diagnosis not present

## 2022-04-08 DIAGNOSIS — C787 Secondary malignant neoplasm of liver and intrahepatic bile duct: Secondary | ICD-10-CM | POA: Diagnosis not present

## 2022-04-08 DIAGNOSIS — C7971 Secondary malignant neoplasm of right adrenal gland: Secondary | ICD-10-CM | POA: Diagnosis not present

## 2022-04-08 DIAGNOSIS — C182 Malignant neoplasm of ascending colon: Secondary | ICD-10-CM | POA: Diagnosis not present

## 2022-04-08 DIAGNOSIS — Z7189 Other specified counseling: Secondary | ICD-10-CM

## 2022-04-08 MED ORDER — HEPARIN SOD (PORK) LOCK FLUSH 100 UNIT/ML IV SOLN
500.0000 [IU] | Freq: Once | INTRAVENOUS | Status: AC | PRN
  Administered 2022-04-08: 500 [IU]
  Filled 2022-04-08: qty 5

## 2022-04-08 MED ORDER — SODIUM CHLORIDE 0.9% FLUSH
10.0000 mL | INTRAVENOUS | Status: DC | PRN
  Administered 2022-04-08: 10 mL
  Filled 2022-04-08: qty 10

## 2022-04-15 ENCOUNTER — Inpatient Hospital Stay: Payer: Medicare HMO

## 2022-04-15 ENCOUNTER — Inpatient Hospital Stay (HOSPITAL_BASED_OUTPATIENT_CLINIC_OR_DEPARTMENT_OTHER): Payer: Medicare HMO | Admitting: Licensed Clinical Social Worker

## 2022-04-15 DIAGNOSIS — C182 Malignant neoplasm of ascending colon: Secondary | ICD-10-CM

## 2022-04-15 NOTE — Progress Notes (Signed)
REFERRING PROVIDER: Cammie Sickle, MD [place],  [place] [address]  PRIMARY PROVIDER:  Sofie Hartigan, MD  PRIMARY REASON FOR VISIT:  1. Cancer of right colon ([place])      HISTORY OF PRESENT ILLNESS:   Oscar [name], a 81 y.o. male, was seen for a [facility] cancer genetics consultation at the request of Dr. Rogue Bussing due to a personal history of colon cancer and abnormal Guardant testing (BRCA mutation per Dr. Richrd Humbles note, report not available for review).  Oscar [name] presents to clinic today to discuss the possibility of a hereditary predisposition to cancer, genetic testing, and to further clarify his future cancer risks, as well as potential cancer risks for family members.   At the age of 26, Oscar [name] was diagnosed with metastatic colon cancer. He had Foundation One testing in 2020 that showed a SMAD4 mutation, and recently had Guardant testing through [place] that reportedly showed a BRCA mutation.   CANCER HISTORY:  Oncology History Overview Note  # MAY 2020- 3. 03/09/19 Liver biopsy. Microscopic examination shows malignant cells with glandular architecture consistent with adenocarcinoma. The malignant cells are positive for CK20 and CDX-2. These findings support the clinical impression of metastatic colon adenocarcinoma. 4. 03/10/19 R hemicolectomy. Tumor site cecum. Adenocarcinoma. Mucinous features present. G2. No tumor deposits. Invades visceral peritoneum. No tumor perforation. LVI present. PNI not identified. All margins uninvolved. 1/12 LNs. PT4apN1. Periappendiceal inflammation c/w resolving abscess. Microsatellite stable (MSS). [Dr.Mettu; [facility]]  # SEP 4th 2020 [compared to May 2020]  Interval increase in size of the metastases to the hepatic dome, The metastasis to the left hepatic lobe is unchanged; 2.  New subcentimeter hypoattenuating lesion in the inferior right hepatic lobe, incompletely characterized on CT. 3.  Postsurgical changes following right  hemicolectomy.  # OCT 2020- FOLFOX +avastin; CT dec 22nd 2020- [compared to [facility] sep 9th 2020]-Liver- slight progression versus stable disease; CT scan SEP 4th 2021- Progressive disease-left lower lobe lung nodule 8 mm [previously 4 mm]; increase in size of the hepatic metastases by few millimeters.;  Soft tissue nodule adjacent to anastomotic site again increased by few millimeters.STOP FOLFOX; cont avastin  #  SEP 20th, 2021- FOLFIRI+ AVASTIN; HOLD FEB 2022- sec to poor tolerance [peptic ulcer]; 3/30-liver ablation-   [bland embolization of segment 2/3 liver lesion on 12/19/2020;  microwave ablation of both liver lesions.  # ?July 25th, 2022- BEV _ TAS 102.MAY 2022- s/p Liver ablation  # DEC 2022-progressive disease-lung and liver/abdomen; JAN 31st 2023-START Regorefenib.   # FEB /16-EGD [Dr.Anna-duodenal ulcer-? meloxicam]  # JAN 2022- COVID [s/p Mab infusion; pills; skin rash resolved.]  # NGS/F-ONE-MUTATED K-RAS [G]       Cancer of right colon ([place])  07/05/2019 Initial Diagnosis   Cancer of right colon ([place])   07/24/2019 - 06/25/2020 Chemotherapy   The patient had dexamethasone (DECADRON) 4 MG tablet, 8 mg, Oral, Daily, 1 of 1 cycle, Start date: --, End date: -- palonosetron (ALOXI) injection 0.25 mg, 0.25 mg, Intravenous,  Once, 23 of 25 cycles Administration: 0.25 mg (07/24/2019), 0.25 mg (08/07/2019), 0.25 mg (08/21/2019), 0.25 mg (09/04/2019), 0.25 mg (09/20/2019), 0.25 mg (10/04/2019), 0.25 mg (10/16/2019), 0.25 mg (10/30/2019), 0.25 mg (11/22/2019), 0.25 mg (12/06/2019), 0.25 mg (12/20/2019), 0.25 mg (01/03/2020), 0.25 mg (01/29/2020), 0.25 mg (02/12/2020), 0.25 mg (02/26/2020), 0.25 mg (03/11/2020), 0.25 mg (03/25/2020), 0.25 mg (04/08/2020), 0.25 mg (04/24/2020), 0.25 mg (05/08/2020), 0.25 mg (05/22/2020), 0.25 mg (06/10/2020), 0.25 mg (06/25/2020) leucovorin 800 mg in dextrose 5 %  250 mL infusion, 844 mg, Intravenous,  Once, 23 of 25 cycles Administration: 800 mg (07/24/2019), 800 mg (08/07/2019), 800  mg (08/21/2019), 800 mg (09/04/2019), 800 mg (09/20/2019), 800 mg (10/04/2019), 800 mg (10/16/2019), 800 mg (10/30/2019), 800 mg (11/22/2019), 800 mg (12/06/2019), 800 mg (12/20/2019), 800 mg (01/03/2020), 800 mg (01/29/2020), 800 mg (02/12/2020), 800 mg (02/26/2020), 800 mg (03/11/2020), 800 mg (03/25/2020), 800 mg (04/08/2020), 800 mg (04/24/2020), 800 mg (05/08/2020), 800 mg (05/22/2020), 800 mg (06/10/2020), 800 mg (06/25/2020) oxaliplatin (ELOXATIN) 180 mg in dextrose 5 % 500 mL chemo infusion, 85 mg/m2 = 180 mg, Intravenous,  Once, 23 of 25 cycles Dose modification: 178 mg (original dose 85 mg/m2, Cycle 17, Reason: Other (see comments), Comment: insurance adjusted dose ) Administration: 180 mg (07/24/2019), 180 mg (08/07/2019), 180 mg (08/21/2019), 180 mg (09/04/2019), 180 mg (09/20/2019), 180 mg (10/04/2019), 180 mg (10/16/2019), 180 mg (10/30/2019), 180 mg (11/22/2019), 180 mg (12/06/2019), 180 mg (12/20/2019), 180 mg (01/03/2020), 180 mg (01/29/2020), 180 mg (02/12/2020), 180 mg (02/26/2020), 180 mg (03/11/2020), 180 mg (04/08/2020), 180 mg (04/24/2020), 180 mg (05/08/2020), 180 mg (05/22/2020), 180 mg (06/10/2020), 180 mg (06/25/2020) fluorouracil (ADRUCIL) 5,000 mg in sodium chloride 0.9 % 150 mL chemo infusion, 5,050 mg, Intravenous, 1 Day/Dose, 23 of 25 cycles Administration: 5,000 mg (07/24/2019), 5,000 mg (08/07/2019), 5,000 mg (08/21/2019), 5,050 mg (09/04/2019), 5,000 mg (10/04/2019), 5,000 mg (10/16/2019), 5,000 mg (11/22/2019), 5,000 mg (12/06/2019), 5,000 mg (12/20/2019), 5,000 mg (01/03/2020), 5,000 mg (01/29/2020), 5,000 mg (02/12/2020), 5,000 mg (02/26/2020), 5,000 mg (03/11/2020), 5,000 mg (03/25/2020), 5,000 mg (04/08/2020), 5,000 mg (04/24/2020), 5,000 mg (05/08/2020), 5,000 mg (05/22/2020), 5,000 mg (06/10/2020), 5,000 mg (06/25/2020) bevacizumab-bvzr (ZIRABEV) 400 mg in sodium chloride 0.9 % 100 mL chemo infusion, 5 mg/kg = 400 mg, Intravenous,  Once, 23 of 25 cycles Administration: 400 mg (08/07/2019), 400 mg (08/21/2019), 400 mg (09/04/2019), 400 mg  (09/20/2019), 400 mg (10/04/2019), 400 mg (10/16/2019), 400 mg (10/30/2019), 400 mg (11/22/2019), 400 mg (12/06/2019), 400 mg (12/20/2019), 400 mg (01/03/2020), 400 mg (01/29/2020), 400 mg (02/12/2020), 400 mg (02/26/2020), 400 mg (03/11/2020), 400 mg (03/25/2020), 400 mg (04/08/2020), 400 mg (04/24/2020), 400 mg (05/08/2020), 400 mg (05/22/2020), 400 mg (06/10/2020), 400 mg (06/25/2020)  for chemotherapy treatment.    07/08/2020 -  Chemotherapy   Patient is on Treatment Plan : COLORECTAL FOLFIRI / BEVACIZUMAB Q14D     11/05/2020 Cancer Staging   Staging form: Colon and Rectum, AJCC 8th Edition - Clinical: Stage IVC (pM1c) - Signed by Cammie Sickle, MD on 11/05/2020     Past Medical History:  Diagnosis Date   Arthritis    Cancer ([place])    colon cancer 02/2019 per pt    Family history of adverse reaction to anesthesia    cousin took all night to wake up from anesthesia   H/O colon cancer, stage IV    Hyperlipemia    Neuromuscular disorder ([place])    Pre-diabetes     Past Surgical History:  Procedure Laterality Date   COLON SURGERY     ESOPHAGOGASTRODUODENOSCOPY (EGD) WITH PROPOFOL N/A 12/26/2020   Procedure: ESOPHAGOGASTRODUODENOSCOPY (EGD) WITH PROPOFOL;  Surgeon: [name], MD;  Location: [facility] ENDOSCOPY;  Service: Gastroenterology;  Laterality: N/A;   IR ANGIOGRAM SELECTIVE EACH ADDITIONAL VESSEL  12/19/2020   IR ANGIOGRAM SELECTIVE EACH ADDITIONAL VESSEL  03/24/2021   IR ANGIOGRAM SELECTIVE EACH ADDITIONAL VESSEL  03/24/2021   IR ANGIOGRAM SELECTIVE EACH ADDITIONAL VESSEL  04/04/2021   IR ANGIOGRAM SELECTIVE EACH ADDITIONAL VESSEL  04/04/2021   IR ANGIOGRAM SELECTIVE EACH ADDITIONAL  VESSEL  04/04/2021   IR ANGIOGRAM VISCERAL SELECTIVE  12/19/2020   IR ANGIOGRAM VISCERAL SELECTIVE  03/24/2021   IR ANGIOGRAM VISCERAL SELECTIVE  04/04/2021   IR ANGIOGRAM VISCERAL SELECTIVE  04/04/2021   IR EMBO ARTERIAL NOT HEMORR HEMANG INC GUIDE ROADMAPPING  03/24/2021   IR EMBO TUMOR ORGAN ISCHEMIA INFARCT INC GUIDE ROADMAPPING   12/19/2020   IR EMBO TUMOR ORGAN ISCHEMIA INFARCT INC GUIDE ROADMAPPING  04/04/2021   IR IMAGING GUIDED PORT INSERTION  07/20/2019   IR RADIOLOGIST EVAL & MGMT  12/05/2020   IR RADIOLOGIST EVAL & MGMT  02/11/2021   IR RADIOLOGIST EVAL & MGMT  03/04/2021   IR RADIOLOGIST EVAL & MGMT  04/29/2021   IR RADIOLOGIST EVAL & MGMT  07/22/2021   IR RADIOLOGIST EVAL & MGMT  11/19/2021   IR US GUIDE VASC ACCESS RIGHT  12/19/2020   IR US GUIDE VASC ACCESS RIGHT  03/24/2021   IR US GUIDE VASC ACCESS RIGHT  04/04/2021   JOINT REPLACEMENT Left 2010   Knee   RADIOLOGY WITH ANESTHESIA N/A 01/15/2021   Procedure: CT WITH ANESTHESIA MICROWAVE ABLATION OF LIVER;  Surgeon: Criselda Peaches, MD;  Location: [facility] ORS;  Service: Anesthesiology;  Laterality: N/A;    FAMILY HISTORY:  We obtained a detailed, 4-generation family history.  Significant diagnoses are listed below: Family History  Problem Relation Age of Onset   Peptic Ulcer Disease Father    Oscar [name] has 1 son, 1 daughter, no cancers. He has 2 brothers and 1 sister, no cancers.  Oscar [name]'s mother passed at 82. No known cancers on this side of the family.   Oscar [name]'s father passed at 50. He had cancer behind his ear, possibly skin cancer. No other known cancers on this side of the family.  Oscar [name] is unaware of previous family history of genetic testing for hereditary cancer risks. There is no reported Ashkenazi Jewish ancestry. There is no known consanguinity.    GENETIC COUNSELING ASSESSMENT: Oscar [name] is a 81 y.o. male with a personal history of colon cancer and BRCA mutation on Guardant which is somewhat suggestive of a hereditary cancer syndrome and predisposition to cancer. We, therefore, discussed and recommended the following at today's visit.   DISCUSSION: We discussed that approximately 10% of colorectal cancer is hereditary. There are many genes associated with colorectal cancer including Lynch syndrome genes, but we did discuss SMAD4  gene since this came up on his Foundation One test as well as BRCA1/2.  Cancers and risks are gene specific. We discussed that testing is beneficial for several reasons including knowing about cancer risks, identifying potential screening and risk-reduction options that may be appropriate, and to understand if other family members could be at risk for cancer and allow them to undergo genetic testing.   We reviewed the characteristics, features and inheritance patterns of hereditary cancer syndromes. We also discussed genetic testing, including the appropriate family members to test, the process of testing, insurance coverage and turn-around-time for results. We discussed the implications of a negative, positive and/or variant of uncertain significant result. We recommended Oscar [name] pursue genetic testing for the [facility] Multi-Cancer+RNA gene panel.   Based on Oscar [name]'s personal history of cancer and tumor testing, he meets medical criteria for genetic testing. Despite that he meets criteria, he may still have an out of pocket cost. We discussed that if his out of pocket cost for testing is over $100, the laboratory will call and confirm whether he wants to  proceed with testing.  If the out of pocket cost of testing is less than $100 he will be billed by the genetic testing laboratory.   PLAN: After considering the risks, benefits, and limitations, Oscar [name] provided informed consent to pursue genetic testing. He would like to have his blood drawn on 7/3. The sample will be sent to [facility] for analysis of the Multi-Cancer+RNA panel. Results should be available within approximately 2-3 weeks' time, at which point they will be disclosed by telephone to Oscar [name], as will any additional recommendations warranted by these results. Oscar [name] will receive a summary of his genetic counseling visit and a copy of his results once available. This information will also be available in Epic.    Oscar [name] questions were answered to his satisfaction today. Our contact information was provided should additional questions or concerns arise. Thank you for the referral and allowing [place] to share in the care of your patient.   Faith Rogue, MS, [facility] Genetic Counselor [name] Phone: [phone]  The patient was seen for a total of 25 minutes in face-to-face genetic counseling. His step-daughter was also present.  Dr. Grayland Ormond was available for discussion regarding this case.   _______________________________________________________________________ For Office Staff:  Number of people involved in session: 2 Was an Intern/ student involved with case: no

## 2022-04-16 ENCOUNTER — Ambulatory Visit
Admission: RE | Admit: 2022-04-16 | Discharge: 2022-04-16 | Disposition: A | Discharge status: Home / Self Care | Payer: Medicare HMO | Source: Ambulatory Visit | Attending: Internal Medicine | Admitting: Internal Medicine

## 2022-04-16 DIAGNOSIS — C786 Secondary malignant neoplasm of retroperitoneum and peritoneum: Secondary | ICD-10-CM | POA: Diagnosis not present

## 2022-04-16 DIAGNOSIS — K6389 Other specified diseases of intestine: Secondary | ICD-10-CM | POA: Diagnosis not present

## 2022-04-16 DIAGNOSIS — C182 Malignant neoplasm of ascending colon: Secondary | ICD-10-CM | POA: Diagnosis not present

## 2022-04-16 DIAGNOSIS — I700 Atherosclerosis of aorta: Secondary | ICD-10-CM | POA: Diagnosis not present

## 2022-04-16 DIAGNOSIS — C189 Malignant neoplasm of colon, unspecified: Secondary | ICD-10-CM | POA: Diagnosis not present

## 2022-04-16 DIAGNOSIS — J90 Pleural effusion, not elsewhere classified: Secondary | ICD-10-CM | POA: Diagnosis not present

## 2022-04-16 DIAGNOSIS — I723 Aneurysm of iliac artery: Secondary | ICD-10-CM | POA: Diagnosis not present

## 2022-04-16 MED ORDER — IOHEXOL 300 MG/ML  SOLN
75.0000 mL | Freq: Once | INTRAMUSCULAR | Status: AC | PRN
Start: 2022-04-16 — End: 2022-04-16
  Administered 2022-04-16: 75 mL via INTRAVENOUS

## 2022-04-20 ENCOUNTER — Inpatient Hospital Stay: Payer: Medicare HMO | Attending: Internal Medicine

## 2022-04-20 ENCOUNTER — Encounter: Payer: Self-pay | Admitting: Internal Medicine

## 2022-04-20 ENCOUNTER — Inpatient Hospital Stay: Payer: Medicare HMO | Admitting: Internal Medicine

## 2022-04-20 ENCOUNTER — Inpatient Hospital Stay: Payer: Medicare HMO

## 2022-04-20 DIAGNOSIS — Z5112 Encounter for antineoplastic immunotherapy: Secondary | ICD-10-CM | POA: Insufficient documentation

## 2022-04-20 DIAGNOSIS — Z7189 Other specified counseling: Secondary | ICD-10-CM

## 2022-04-20 DIAGNOSIS — Z452 Encounter for adjustment and management of vascular access device: Secondary | ICD-10-CM | POA: Insufficient documentation

## 2022-04-20 DIAGNOSIS — C182 Malignant neoplasm of ascending colon: Secondary | ICD-10-CM

## 2022-04-20 DIAGNOSIS — C787 Secondary malignant neoplasm of liver and intrahepatic bile duct: Secondary | ICD-10-CM | POA: Diagnosis not present

## 2022-04-20 DIAGNOSIS — Z5111 Encounter for antineoplastic chemotherapy: Secondary | ICD-10-CM | POA: Diagnosis not present

## 2022-04-20 DIAGNOSIS — Z79899 Other long term (current) drug therapy: Secondary | ICD-10-CM | POA: Diagnosis not present

## 2022-04-20 DIAGNOSIS — F17210 Nicotine dependence, cigarettes, uncomplicated: Secondary | ICD-10-CM | POA: Diagnosis not present

## 2022-04-20 LAB — CBC WITH DIFFERENTIAL/PLATELET
Abs Immature Granulocytes: 0.01 10*3/uL (ref 0.00–0.07)
Basophils Absolute: 0 10*3/uL (ref 0.0–0.1)
Basophils Relative: 1 %
Eosinophils Absolute: 0.3 10*3/uL (ref 0.0–0.5)
Eosinophils Relative: 8 %
HCT: 29.9 % — ABNORMAL LOW (ref 39.0–52.0)
Hemoglobin: 9.8 g/dL — ABNORMAL LOW (ref 13.0–17.0)
Immature Granulocytes: 0 %
Lymphocytes Relative: 16 %
Lymphs Abs: 0.6 10*3/uL — ABNORMAL LOW (ref 0.7–4.0)
MCH: 33.3 pg (ref 26.0–34.0)
MCHC: 32.8 g/dL (ref 30.0–36.0)
MCV: 101.7 fL — ABNORMAL HIGH (ref 80.0–100.0)
Monocytes Absolute: 0.4 10*3/uL (ref 0.1–1.0)
Monocytes Relative: 11 %
Neutro Abs: 2.3 10*3/uL (ref 1.7–7.7)
Neutrophils Relative %: 64 %
Platelets: 144 10*3/uL — ABNORMAL LOW (ref 150–400)
RBC: 2.94 MIL/uL — ABNORMAL LOW (ref 4.22–5.81)
RDW: 17.1 % — ABNORMAL HIGH (ref 11.5–15.5)
WBC: 3.6 10*3/uL — ABNORMAL LOW (ref 4.0–10.5)
nRBC: 0 % (ref 0.0–0.2)

## 2022-04-20 LAB — COMPREHENSIVE METABOLIC PANEL
ALT: 19 U/L (ref 0–44)
AST: 29 U/L (ref 15–41)
Albumin: 2.5 g/dL — ABNORMAL LOW (ref 3.5–5.0)
Alkaline Phosphatase: 162 U/L — ABNORMAL HIGH (ref 38–126)
Anion gap: 3 — ABNORMAL LOW (ref 5–15)
BUN: 14 mg/dL (ref 8–23)
CO2: 25 mmol/L (ref 22–32)
Calcium: 8 mg/dL — ABNORMAL LOW (ref 8.9–10.3)
Chloride: 110 mmol/L (ref 98–111)
Creatinine, Ser: 0.84 mg/dL (ref 0.61–1.24)
GFR, Estimated: >60 mL/min (ref >60)
Glucose, Bld: 138 mg/dL — ABNORMAL HIGH (ref 70–99)
Potassium: 3.9 mmol/L (ref 3.5–5.1)
Sodium: 138 mmol/L (ref 135–145)
Total Bilirubin: 0.4 mg/dL (ref 0.3–1.2)
Total Protein: 6.3 g/dL — ABNORMAL LOW (ref 6.5–8.1)

## 2022-04-20 LAB — PROTEIN / CREATININE RATIO, URINE
Creatinine, Urine: 320 mg/dL
Protein Creatinine Ratio: 0.06 mg/mg{Cre} (ref 0.00–0.15)
Total Protein, Urine: 20 mg/dL

## 2022-04-20 MED ORDER — SODIUM CHLORIDE 0.9 % IV SOLN
Freq: Once | INTRAVENOUS | Status: AC
  Filled 2022-04-20: qty 250

## 2022-04-20 MED ORDER — SODIUM CHLORIDE 0.9 % IV SOLN
10.0000 mg | Freq: Once | INTRAVENOUS | Status: AC
  Administered 2022-04-20: 10 mg via INTRAVENOUS
  Filled 2022-04-20: qty 10

## 2022-04-20 MED ORDER — SODIUM CHLORIDE 0.9 % IV SOLN
2400.0000 mg/m2 | INTRAVENOUS | Status: DC
  Administered 2022-04-20: 4500 mg via INTRAVENOUS
  Filled 2022-04-20: qty 90

## 2022-04-20 MED ORDER — PALONOSETRON HCL INJECTION 0.25 MG/5ML
0.2500 mg | Freq: Once | INTRAVENOUS | Status: AC
  Administered 2022-04-20: 0.25 mg via INTRAVENOUS
  Filled 2022-04-20: qty 5

## 2022-04-20 MED ORDER — SODIUM CHLORIDE 0.9 % IV SOLN
180.0000 mg/m2 | Freq: Once | INTRAVENOUS | Status: AC
  Administered 2022-04-20: 340 mg via INTRAVENOUS
  Filled 2022-04-20: qty 2

## 2022-04-20 MED ORDER — ATROPINE SULFATE 1 MG/ML IV SOLN
0.4000 mg | Freq: Once | INTRAVENOUS | Status: AC
  Administered 2022-04-20: 0.4 mg via INTRAVENOUS
  Filled 2022-04-20: qty 1

## 2022-04-20 MED ORDER — SODIUM CHLORIDE 0.9 % IV SOLN
5.0000 mg/kg | Freq: Once | INTRAVENOUS | Status: AC
  Administered 2022-04-20: 350 mg via INTRAVENOUS
  Filled 2022-04-20: qty 14

## 2022-04-20 MED ORDER — SODIUM CHLORIDE 0.9% FLUSH
10.0000 mL | Freq: Once | INTRAVENOUS | Status: AC
  Administered 2022-04-20: 10 mL via INTRAVENOUS
  Filled 2022-04-20: qty 10

## 2022-04-20 NOTE — Progress Notes (Signed)
Patient tolerated chemo infusion well, no questions/concerns voiced. Patient home with pump. No questions, stable at discharge. AVS given.

## 2022-04-20 NOTE — Progress Notes (Signed)
Patient denies new problems/concerns today.   °

## 2022-04-20 NOTE — Assessment & Plan Note (Addendum)
#  Right-sided colon adenocarcinoma-with synchronous metastasis to liver/unresectable;  Currently on FOLFIRI+ Zira-Bev. S/p Evaluation at [facility] opinion/reviewed the note from [facility]; and also discussed with Dr Dennison Nancy, who agrees with current treatment plan.  Status post 5 cycles of FOLFIRI +BEV- JUNE 29th, 2023- Improving appearance of metastatic disease lung nodules; pleural effusions; metastatic disease in the liver; abdominal soft tissue lesion/lymphadenopathy peritoneal disease [however still with extensive metastatic disease].   #Proceed with cycle #6 of FOLFIRI plus Bev.  labs today reviewed;  acceptable for treatment today. CEA trending down. Improving-see above.  Discussed with the patient that treatments are still palliative/indefinite.  # Nausea/vomitting- sec to chemo G-1; zofran prn.STABLE.    # Right upper quadrant pain-secondary to malignancy-s/p evaluation with [name]; continue oxycodone 5 mg up to 1-2 pills a day as needed.   STABLE.   # Proteinuria: UA +100 protein: JUNE 2023 protein: creatinine ratio:- 0.08- Okay to proceed with BeV.   #Weight loss: sec to malignancy; appetite improved; monitor for now- worse; s/p evaluation with [facility].   STABLE.  # Genetic counseling: 2 brother/sister; 2 biological children. Liquid biopsy/guardant testing/at [facility] [May 2023]-"abnormal" [report not available to me]; s/p  genetic counseling on 6/28- results pending.   #Incidental findings on Imaging  CT scan JUNE , 2023:continued tethering of the small bowel loops adjacent to the ileocolic anastomosis in the RIGHT upper quadrant. No signs of bowel obstruction;  Marked aortic atherosclerosis. 3 cm aneurysm of the infrarenal abdominal aorta;  Mild aneurysmal dilation of the RIGHT common iliac artery; Aortic Atherosclerosis- I reviewed/discussed/counseled the patient.   # Mediport- functioning-STABLE>  # DISPOSITION:  # Chemo today; pump off on D-3 # follow up in 2 weeks-  MD;labs-  cbc/cmp CEA ;urine protein creatinine ratio;  FOLFIRI +Zira-Bev; pump off D-3;-Dr.B  # I reviewed the blood work- with the patient in detail; also reviewed the imaging independently [as summarized above]; and with the patient in detail.

## 2022-04-20 NOTE — Patient Instructions (Signed)
[facility] CANCER CTR AT [place]  Discharge Instructions: Thank you for choosing [facility] to provide your oncology and hematology care.  If you have a lab appointment with the [facility], please go directly to the [place] and check in at the registration area.  Wear comfortable clothing and clothing appropriate for easy access to any Portacath or PICC line.   We strive to give you quality time with your provider. You may need to reschedule your appointment if you arrive late (15 or more minutes).  Arriving late affects you and other patients whose appointments are after yours.  Also, if you miss three or more appointments without notifying the office, you may be dismissed from the clinic at the provider's discretion.      For prescription refill requests, have your pharmacy contact our office and allow 72 hours for refills to be completed.    Today you received the following chemotherapy and/or immunotherapy agents: Folfiri/ Bev    To help prevent nausea and vomiting after your treatment, we encourage you to take your nausea medication as directed.  BELOW ARE SYMPTOMS THAT SHOULD BE REPORTED IMMEDIATELY: *FEVER GREATER THAN 100.4 F (38 C) OR HIGHER *CHILLS OR SWEATING *NAUSEA AND VOMITING THAT IS NOT CONTROLLED WITH YOUR NAUSEA MEDICATION *UNUSUAL SHORTNESS OF BREATH *UNUSUAL BRUISING OR BLEEDING *URINARY PROBLEMS (pain or burning when urinating, or frequent urination) *BOWEL PROBLEMS (unusual diarrhea, constipation, pain near the anus) TENDERNESS IN MOUTH AND THROAT WITH OR WITHOUT PRESENCE OF ULCERS (sore throat, sores in mouth, or a toothache) UNUSUAL RASH, SWELLING OR PAIN  UNUSUAL VAGINAL DISCHARGE OR ITCHING   Items with * indicate a potential emergency and should be followed up as soon as possible or go to the Emergency Department if any problems should occur.  Please show the CHEMOTHERAPY ALERT CARD or IMMUNOTHERAPY ALERT CARD at check-in to  the Emergency Department and triage nurse.  Should you have questions after your visit or need to cancel or reschedule your appointment, please contact [facility] CANCER [place] AT [place]  [phone] and follow the prompts.  Office hours are 8:00 a.m. to 4:30 p.m. Monday - Friday. Please note that voicemails left after 4:00 p.m. may not be returned until the following business day.  We are closed weekends and major holidays. You have access to a nurse at all times for urgent questions. Please call the main number to the clinic [phone] and follow the prompts.  For any non-urgent questions, you may also contact your provider using MyChart. We now offer e-Visits for anyone 73 and older to request care online for non-urgent symptoms. For details visit [email or web address].   Also download the MyChart app! Go to the app store, search "MyChart", open the app, select [facility], and log in with your MyChart username and password.  Masks are optional in the cancer centers. If you would like for your care team to wear a mask while they are taking care of you, please let them know. For doctor visits, patients may have with them one support person who is at least 79 years old. At this time, visitors are not allowed in the infusion area.

## 2022-04-20 NOTE — Progress Notes (Signed)
[facility] NOTE  Patient Care Team: Sofie Hartigan, MD as PCP - General (Family Medicine) Cammie Sickle, MD as Consulting Physician (Hematology and Oncology)  CHIEF COMPLAINTS/PURPOSE OF CONSULTATION: Colon cancer  #  Oncology History Overview Note  # MAY 2020- 3. 03/09/19 Liver biopsy. Microscopic examination shows malignant cells with glandular architecture consistent with adenocarcinoma. The malignant cells are positive for CK20 and CDX-2. These findings support the clinical impression of metastatic colon adenocarcinoma. 4. 03/10/19 R hemicolectomy. Tumor site cecum. Adenocarcinoma. Mucinous features present. G2. No tumor deposits. Invades visceral peritoneum. No tumor perforation. LVI present. PNI not identified. All margins uninvolved. 1/12 LNs. PT4apN1. Periappendiceal inflammation c/w resolving abscess. Microsatellite stable (MSS). [Dr.Mettu; [facility]]  # SEP 4th 2020 [compared to May 2020]  Interval increase in size of the metastases to the hepatic dome, The metastasis to the left hepatic lobe is unchanged; 2.  New subcentimeter hypoattenuating lesion in the inferior right hepatic lobe, incompletely characterized on CT. 3.  Postsurgical changes following right hemicolectomy.  # OCT 2020- FOLFOX +avastin; CT dec 22nd 2020- [compared to [facility] sep 9th 2020]-Liver- slight progression versus stable disease; CT scan SEP 4th 2021- Progressive disease-left lower lobe lung nodule 8 mm [previously 4 mm]; increase in size of the hepatic metastases by few millimeters.;  Soft tissue nodule adjacent to anastomotic site again increased by few millimeters.STOP FOLFOX; cont avastin  #  SEP 20th, 2021- FOLFIRI+ AVASTIN; HOLD FEB 2022- sec to poor tolerance [peptic ulcer]; 3/30-liver ablation-   [bland embolization of segment 2/3 liver lesion on 12/19/2020;  microwave ablation of both liver lesions.  # ?July 25th, 2022- BEV _ TAS 102.MAY 2022- s/p Liver ablation  # DEC  2022-progressive disease-lung and liver/abdomen; JAN 31st 2023-START Regorefenib.   # FEB /16-EGD [Dr.Anna-duodenal ulcer-? meloxicam]  # JAN 2022- COVID [s/p Mab infusion; pills; skin rash resolved.]  # NGS/F-ONE-MUTATED K-RAS [G]       Cancer of right colon ([place])  07/05/2019 Initial Diagnosis   Cancer of right colon ([place])   07/24/2019 - 06/25/2020 Chemotherapy   The patient had dexamethasone (DECADRON) 4 MG tablet, 8 mg, Oral, Daily, 1 of 1 cycle, Start date: --, End date: -- palonosetron (ALOXI) injection 0.25 mg, 0.25 mg, Intravenous,  Once, 23 of 25 cycles Administration: 0.25 mg (07/24/2019), 0.25 mg (08/07/2019), 0.25 mg (08/21/2019), 0.25 mg (09/04/2019), 0.25 mg (09/20/2019), 0.25 mg (10/04/2019), 0.25 mg (10/16/2019), 0.25 mg (10/30/2019), 0.25 mg (11/22/2019), 0.25 mg (12/06/2019), 0.25 mg (12/20/2019), 0.25 mg (01/03/2020), 0.25 mg (01/29/2020), 0.25 mg (02/12/2020), 0.25 mg (02/26/2020), 0.25 mg (03/11/2020), 0.25 mg (03/25/2020), 0.25 mg (04/08/2020), 0.25 mg (04/24/2020), 0.25 mg (05/08/2020), 0.25 mg (05/22/2020), 0.25 mg (06/10/2020), 0.25 mg (06/25/2020) leucovorin 800 mg in dextrose 5 % 250 mL infusion, 844 mg, Intravenous,  Once, 23 of 25 cycles Administration: 800 mg (07/24/2019), 800 mg (08/07/2019), 800 mg (08/21/2019), 800 mg (09/04/2019), 800 mg (09/20/2019), 800 mg (10/04/2019), 800 mg (10/16/2019), 800 mg (10/30/2019), 800 mg (11/22/2019), 800 mg (12/06/2019), 800 mg (12/20/2019), 800 mg (01/03/2020), 800 mg (01/29/2020), 800 mg (02/12/2020), 800 mg (02/26/2020), 800 mg (03/11/2020), 800 mg (03/25/2020), 800 mg (04/08/2020), 800 mg (04/24/2020), 800 mg (05/08/2020), 800 mg (05/22/2020), 800 mg (06/10/2020), 800 mg (06/25/2020) oxaliplatin (ELOXATIN) 180 mg in dextrose 5 % 500 mL chemo infusion, 85 mg/m2 = 180 mg, Intravenous,  Once, 23 of 25 cycles Dose modification: 178 mg (original dose 85 mg/m2, Cycle 17, Reason: Other (see comments), Comment: insurance adjusted dose ) Administration: 180 mg (07/24/2019), 180  mg  (08/07/2019), 180 mg (08/21/2019), 180 mg (09/04/2019), 180 mg (09/20/2019), 180 mg (10/04/2019), 180 mg (10/16/2019), 180 mg (10/30/2019), 180 mg (11/22/2019), 180 mg (12/06/2019), 180 mg (12/20/2019), 180 mg (01/03/2020), 180 mg (01/29/2020), 180 mg (02/12/2020), 180 mg (02/26/2020), 180 mg (03/11/2020), 180 mg (04/08/2020), 180 mg (04/24/2020), 180 mg (05/08/2020), 180 mg (05/22/2020), 180 mg (06/10/2020), 180 mg (06/25/2020) fluorouracil (ADRUCIL) 5,000 mg in sodium chloride 0.9 % 150 mL chemo infusion, 5,050 mg, Intravenous, 1 Day/Dose, 23 of 25 cycles Administration: 5,000 mg (07/24/2019), 5,000 mg (08/07/2019), 5,000 mg (08/21/2019), 5,050 mg (09/04/2019), 5,000 mg (10/04/2019), 5,000 mg (10/16/2019), 5,000 mg (11/22/2019), 5,000 mg (12/06/2019), 5,000 mg (12/20/2019), 5,000 mg (01/03/2020), 5,000 mg (01/29/2020), 5,000 mg (02/12/2020), 5,000 mg (02/26/2020), 5,000 mg (03/11/2020), 5,000 mg (03/25/2020), 5,000 mg (04/08/2020), 5,000 mg (04/24/2020), 5,000 mg (05/08/2020), 5,000 mg (05/22/2020), 5,000 mg (06/10/2020), 5,000 mg (06/25/2020) bevacizumab-bvzr (ZIRABEV) 400 mg in sodium chloride 0.9 % 100 mL chemo infusion, 5 mg/kg = 400 mg, Intravenous,  Once, 23 of 25 cycles Administration: 400 mg (08/07/2019), 400 mg (08/21/2019), 400 mg (09/04/2019), 400 mg (09/20/2019), 400 mg (10/04/2019), 400 mg (10/16/2019), 400 mg (10/30/2019), 400 mg (11/22/2019), 400 mg (12/06/2019), 400 mg (12/20/2019), 400 mg (01/03/2020), 400 mg (01/29/2020), 400 mg (02/12/2020), 400 mg (02/26/2020), 400 mg (03/11/2020), 400 mg (03/25/2020), 400 mg (04/08/2020), 400 mg (04/24/2020), 400 mg (05/08/2020), 400 mg (05/22/2020), 400 mg (06/10/2020), 400 mg (06/25/2020)  for chemotherapy treatment.    07/08/2020 -  Chemotherapy   Patient is on Treatment Plan : COLORECTAL FOLFIRI / BEVACIZUMAB Q14D     11/05/2020 Cancer Staging   Staging form: Colon and Rectum, AJCC 8th Edition - Clinical: Stage IVC (pM1c) - Signed by Cammie Sickle, MD on 11/05/2020    HISTORY OF PRESENTING ILLNESS: He is  alone.  Ambulating independently.  Oscar [name] 81 y.o.  male with metastatic colon cancer to the liver-s/p multiple lines of therapy -currently on FOLFIRI + Bev is here follow-up/review results of the follow-up CT scan.  Patient is currently s/p chemotherapy cycle #5.  Complains of mild nausea-postchemotherapy.  As needed antiemetics.   No worsening diarrhea.  Chronic mild abdominal pain for which he has been taking oxycodone 5 mg up to 1-2 pills a day.   No sores in the mouth.  No rash on palms and soles.    Patient is holding his weight.  States his appetite is good.  Review of Systems  Constitutional:  Positive for malaise/fatigue. Negative for chills, diaphoresis and fever.  HENT:  Negative for nosebleeds and sore throat.   Eyes:  Negative for double vision.  Respiratory:  Negative for cough, hemoptysis, sputum production, shortness of breath and wheezing.   Cardiovascular:  Negative for chest pain, palpitations, orthopnea and leg swelling.  Gastrointestinal:  Negative for blood in stool, constipation, heartburn, melena, nausea and vomiting.  Genitourinary:  Negative for dysuria, frequency and urgency.  Musculoskeletal:  Positive for back pain and joint pain.  Skin: Negative.  Negative for itching and rash.  Neurological:  Positive for tingling. Negative for focal weakness, weakness and headaches.  Endo/Heme/Allergies:  Does not bruise/bleed easily.  Psychiatric/Behavioral:  Negative for depression. The patient is not nervous/anxious and does not have insomnia.      MEDICAL HISTORY:  Past Medical History:  Diagnosis Date   Arthritis    Cancer ([place])    colon cancer 02/2019 per pt    Family history of adverse reaction to anesthesia    cousin took all night to wake  up from anesthesia   H/O colon cancer, stage IV    Hyperlipemia    Neuromuscular disorder ([place])    Pre-diabetes     SURGICAL HISTORY: Past Surgical History:  Procedure Laterality Date   COLON SURGERY      ESOPHAGOGASTRODUODENOSCOPY (EGD) WITH PROPOFOL N/A 12/26/2020   Procedure: ESOPHAGOGASTRODUODENOSCOPY (EGD) WITH PROPOFOL;  Surgeon: [name], MD;  Location: [facility] ENDOSCOPY;  Service: Gastroenterology;  Laterality: N/A;   IR ANGIOGRAM SELECTIVE EACH ADDITIONAL VESSEL  12/19/2020   IR ANGIOGRAM SELECTIVE EACH ADDITIONAL VESSEL  03/24/2021   IR ANGIOGRAM SELECTIVE EACH ADDITIONAL VESSEL  03/24/2021   IR ANGIOGRAM SELECTIVE EACH ADDITIONAL VESSEL  04/04/2021   IR ANGIOGRAM SELECTIVE EACH ADDITIONAL VESSEL  04/04/2021   IR ANGIOGRAM SELECTIVE EACH ADDITIONAL VESSEL  04/04/2021   IR ANGIOGRAM VISCERAL SELECTIVE  12/19/2020   IR ANGIOGRAM VISCERAL SELECTIVE  03/24/2021   IR ANGIOGRAM VISCERAL SELECTIVE  04/04/2021   IR ANGIOGRAM VISCERAL SELECTIVE  04/04/2021   IR EMBO ARTERIAL NOT HEMORR HEMANG INC GUIDE ROADMAPPING  03/24/2021   IR EMBO TUMOR ORGAN ISCHEMIA INFARCT INC GUIDE ROADMAPPING  12/19/2020   IR EMBO TUMOR ORGAN ISCHEMIA INFARCT INC GUIDE ROADMAPPING  04/04/2021   IR IMAGING GUIDED PORT INSERTION  07/20/2019   IR RADIOLOGIST EVAL & MGMT  12/05/2020   IR RADIOLOGIST EVAL & MGMT  02/11/2021   IR RADIOLOGIST EVAL & MGMT  03/04/2021   IR RADIOLOGIST EVAL & MGMT  04/29/2021   IR RADIOLOGIST EVAL & MGMT  07/22/2021   IR RADIOLOGIST EVAL & MGMT  11/19/2021   IR US GUIDE VASC ACCESS RIGHT  12/19/2020   IR US GUIDE VASC ACCESS RIGHT  03/24/2021   IR US GUIDE VASC ACCESS RIGHT  04/04/2021   JOINT REPLACEMENT Left 2010   Knee   RADIOLOGY WITH ANESTHESIA N/A 01/15/2021   Procedure: CT WITH ANESTHESIA MICROWAVE ABLATION OF LIVER;  Surgeon: Criselda Peaches, MD;  Location: [facility] ORS;  Service: Anesthesiology;  Laterality: N/A;    SOCIAL HISTORY: Social History   Socioeconomic History   Marital status: Married    Spouse name: Not on file   Number of children: Not on file   Years of education: Not on file   Highest education level: Not on file  Occupational History   Not on file  Tobacco Use   Smoking status: Every  Day    Packs/day: 0.25    Years: 15.00    Total pack years: 3.75    Types: Cigarettes   Smokeless tobacco: Never   Tobacco comments:    1 to 2 cigarettes a day occasionally  Vaping Use   Vaping Use: Never used  Substance and Sexual Activity   Alcohol use: Yes    Comment: rarely    Drug use: No   Sexual activity: Not on file  Other Topics Concern   Not on file  Social History Narrative   Recruitment consultant retd; lives in [place]; smoking 3cig/day; [3/4 ppd x started at 9 years]; no alcohol. Son & daughter; wife dementia [waiting for placement].    Social Determinants of Health   Financial Resource Strain: Not on file  Food Insecurity: Not on file  Transportation Needs: Not on file  Physical Activity: Not on file  Stress: Not on file  Social Connections: Not on file  Intimate Partner Violence: Not on file    FAMILY HISTORY: Family History  Problem Relation Age of Onset   Peptic Ulcer Disease Father     ALLERGIES:  is allergic to ace inhibitors.  MEDICATIONS:  Current Outpatient Medications  Medication Sig Dispense Refill   oxyCODONE (OXY IR/ROXICODONE) 5 MG immediate release tablet Take 1 tablet (5 mg total) by mouth every 8 (eight) hours as needed for severe pain. 60 tablet 0   amLODipine (NORVASC) 5 MG tablet Take 5 mg by mouth daily. (Patient not taking: Reported on 10/03/2021)     diphenoxylate-atropine (LOMOTIL) 2.5-0.025 MG tablet Take 1 tablet by mouth 4 (four) times daily as needed for diarrhea or loose stools. Take it along with immodium (Patient not taking: Reported on 02/23/2022) 60 tablet 0   ondansetron (ZOFRAN) 8 MG tablet Take 1 tablet (8 mg total) by mouth every 8 (eight) hours as needed for nausea or vomiting. (Patient not taking: Reported on 03/09/2022) 30 tablet 0   polyethylene glycol powder (GLYCOLAX/MIRALAX) 17 GM/SCOOP powder Take by mouth as needed. (Patient not taking: Reported on 12/29/2021)     pravastatin (PRAVACHOL) 40 MG tablet Take  40 mg by mouth daily.  (Patient not taking: Reported on 06/09/2021)     predniSONE (DELTASONE) 20 MG tablet Take 1 tablet (20 mg total) by mouth daily with breakfast. (Patient not taking: Reported on 02/06/2022) 30 tablet 1   prochlorperazine (COMPAZINE) 10 MG tablet Take 1 tablet (10 mg total) by mouth every 6 (six) hours as needed for nausea or vomiting. (Patient not taking: Reported on 03/09/2022) 60 tablet 0   regorafenib (STIVARGA) 40 MG tablet Take 2 tablets (80 mg total) by mouth daily with breakfast. Take for 21 days, then hold for 7 days. Repeat every 28 days. (Patient not taking: Reported on 02/06/2022) 42 tablet 1   senna (SENOKOT) 8.6 MG TABS tablet Take 1 tablet (8.6 mg total) by mouth daily. (Patient not taking: Reported on 12/29/2021) 120 tablet 0   sucralfate (CARAFATE) 1 g tablet Take 1 tablet (1 g total) by mouth 4 (four) times daily -  with meals and at bedtime. (Patient not taking: Reported on 11/18/2021) 90 tablet 3   tamsulosin (FLOMAX) 0.4 MG CAPS capsule Take 1 capsule (0.4 mg total) by mouth daily. (Patient not taking: Reported on 04/06/2022) 90 capsule 1   No current facility-administered medications for this visit.      Marland Kitchen  PHYSICAL EXAMINATION: ECOG PERFORMANCE STATUS: 0 - Asymptomatic  Vitals:   04/20/22 1000  BP: 116/74  Pulse: 63  Resp: 16  Temp: 98.7 F (37.1 C)    Filed Weights   04/20/22 1000  Weight: 152 lb 9.6 oz (69.2 kg)     Physical Exam HENT:     Head: Normocephalic and atraumatic.     Mouth/Throat:     Pharynx: No oropharyngeal exudate.  Eyes:     Pupils: Pupils are equal, round, and reactive to light.  Cardiovascular:     Rate and Rhythm: Normal rate and regular rhythm.  Pulmonary:     Effort: No respiratory distress.     Breath sounds: No wheezing.     Comments: Decreased breath sounds bilaterally.  No wheeze or crackles Abdominal:     General: Bowel sounds are normal. There is no distension.     Palpations: Abdomen is soft. There is  no mass.     Tenderness: There is no abdominal tenderness. There is no guarding or rebound.  Musculoskeletal:        General: No tenderness. Normal range of motion.     Cervical back: Normal range of motion and neck supple.  Skin:    General:  Skin is warm.  Neurological:     Mental Status: He is alert and oriented to person, place, and time.  Psychiatric:        Mood and Affect: Affect normal.    LABORATORY DATA:  I have reviewed the data as listed Lab Results  Component Value Date   WBC 3.6 (L) 04/20/2022   HGB 9.8 (L) 04/20/2022   HCT 29.9 (L) 04/20/2022   MCV 101.7 (H) 04/20/2022   PLT 144 (L) 04/20/2022   Recent Labs    03/09/22 0916 03/23/22 0813 04/06/22 0829  NA 137 138 141  K 3.8 3.6 3.7  CL 107 106 109  CO2 $Re'25 27 25  'VMN$ GLUCOSE 156* 134* 136*  BUN $Re'10 11 13  'Hda$ CREATININE 0.70 0.84 0.86  CALCIUM 7.8* 8.2* 8.2*  GFRNONAA >60 >60 >60  PROT 7.3 7.5 6.9  ALBUMIN 2.4* 2.7* 2.7*  AST 37 36 33  ALT $Re'20 21 21  'jFj$ ALKPHOS 176* 188* 160*  BILITOT 0.6 0.6 0.6    RADIOGRAPHIC STUDIES: I have personally reviewed the radiological images as listed and agreed with the findings in the report. CT CHEST ABDOMEN PELVIS W CONTRAST  Result Date: 04/17/2022 CLINICAL DATA:  A 81 year old male presents for evaluation of colon cancer, assessment of treatment response. * Tracking Code: BO * EXAM: CT CHEST, ABDOMEN, AND PELVIS WITH CONTRAST TECHNIQUE: Multidetector CT imaging of the chest, abdomen and pelvis was performed following the standard protocol during bolus administration of intravenous contrast. RADIATION DOSE REDUCTION: This exam was performed according to the departmental dose-optimization program which includes automated exposure control, adjustment of the mA and/or kV according to patient size and/or use of iterative reconstruction technique. CONTRAST:  70mL OMNIPAQUE IOHEXOL 300 MG/ML  SOLN COMPARISON:  February 09, 2022. FINDINGS: CT CHEST FINDINGS Cardiovascular: RIGHT-sided  Port-A-Cath terminates at the caval to atrial junction as before. Calcified coronary artery disease. No pericardial effusion or nodularity. Aortic atherosclerosis without aneurysm. Central pulmonary vasculature is unremarkable on venous phase. Mediastinum/Nodes: Mild circumferential esophageal thickening is long segment and unchanged. Posterior mediastinal lymph node (image 36/2) 10 mm previously 18 mm short axis. RIGHT hilar lymph node (image 31/2) 18 mm, previously 23 mm. LEFT juxta hilar nodal tissue (image 33/2) 13 mm previously 18 mm. No axillary adenopathy. No new adenopathy in the chest. Lungs/Pleura: Signs of pulmonary metastatic disease with numerous pulmonary nodules. (Image 117/3) 12 mm RIGHT lower lobe pulmonary nodule previously 16 mm. Pleural nodularity throughout the posterior chest with subjective decrease in size of many of these areas. Near confluent pleural nodularity measuring 10 mm greatest thickness (image 67/3) previously 15 mm greatest thickness. Anterior LEFT upper lobe pulmonary nodule (image 38/3) 10 mm previously 10 mm. Bandlike bronchovascular nodularity in the RIGHT chest measuring 11 mm greatest thickness (image 76/3) previously 15 mm. Central bronchovascular area of nodularity (image 81/3) 2.3 x 1.3 cm previously 2.8 x 1.8 cm. Areas extending to the periphery in the LEFT upper lobe are similar to the prior study while the central area has diminished in size. There are no definitive signs of new pulmonary nodules insert 9 greater than a cm. Masslike area about the LEFT hilum also with decreased size. Discrete mass in this area measuring 3.2 cm may have been as large as 4.6 cm previously and there is improved aeration and diminished LEFT-sided pleural fluid. Pleural effusion remains moderate, previously large. LEFT posterior pleural mass measured up to 4.6 cm previously now at 3.6 cm. (Image 47/2) LEFT posterior pleural mass on image  67/2) 8.3 cm greatest axial dimension, previously as  large as 9 cm in the axial plane. Musculoskeletal: See below for full musculoskeletal details. CT ABDOMEN PELVIS FINDINGS Hepatobiliary: Confluent LEFT hepatic disease (image 62/2) 5.5 cm previously 6.8 cm. Predominantly low attenuation large lesion in the RIGHT hepatic lobe, hepatic subsegment VIII is stable at approximately 7.5 cm still with low-density currently. The LEFT hepatic lobe lesion with intermediate density. Nodularity along the surface of the liver and along the anterior peritoneum adjacent to the liver is similar. Discrete lesion in the RIGHT hepatic lobe (image 59/2) 14 mm as compared to 20 mm. Other lesions with similar slight decrease in size without definitive new sign of lesion in this patient with multiple hepatic lesions. Stable dilation of the common bile duct. Pancreas: No peripancreatic stranding or ductal dilation with pancreatic atrophy. Spleen: No splenic lesion. Small amount of perisplenic fluid similar to the previous study. Adrenals/Urinary Tract: Adrenal gland on the LEFT is normal, on the RIGHT remains encased by soft tissue which is diminished in size and shows less enhancement. Soft tissue and adenopathy in the RIGHT retroperitoneum measuring approximately 7.6 as compared to 10.3 cm (image 70/2) thickness approximately 3.8 cm as compared to 5.0 cm. In general adenopathy in the upper abdomen with diminished attenuation. Infiltrative process continues to involve the RIGHT kidney but show similar decrease in size. Benign-appearing LEFT renal cyst for which no dedicated imaging follow-up is recommended is unchanged. There is no hydronephrosis. Stomach/Bowel: Colonic stool is moderate. No stranding adjacent to the stomach, small or large bowel. Tethering of small bowel loops in the RIGHT upper quadrant following RIGHT hemicolectomy, adjacent to the anastomotic site measuring 2.9 x 2.2 cm previously 3.5 x 4.1 cm. Vascular/Lymphatic: Aortic atherosclerosis both calcified and noncalcified  plaque similar to prior imaging. 3 cm aneurysm of the infrarenal abdominal aorta with irregular atherosclerotic plaque. Dilation of the RIGHT common iliac artery up to 19 mm is also stable. Patent abdominal vessels. Adenopathy throughout the retroperitoneum with decrease in size following other areas in the abdomen previously discussed also with decreased attenuation. No new signs of adenopathy in either the abdomen or pelvis. Reproductive: Unremarkable by CT. Other: Small volume ascites in the pelvis similar to prior imaging. Nodularity of the anterior peritoneum adjacent to the liver. Small volume fluid about the spleen is similar as well. No pneumoperitoneum. Musculoskeletal: No acute bone finding. No destructive bone process. Spinal degenerative changes. IMPRESSION: 1. Improving appearance of metastatic disease throughout the chest and abdomen as discussed still with extensive metastatic disease. 2. Continued tethering of the small bowel loops adjacent to the ileocolic anastomosis in the RIGHT upper quadrant. No signs of bowel obstruction. 3. Continued signs of peritoneal metastases. 4. Marked aortic atherosclerosis. 3 cm aneurysm of the infrarenal abdominal aorta, attention on follow-up or dedicated follow-up at 3 years as warranted. 5. 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 Aortic Atherosclerosis (ICD10-I70.0). Electronically Signed   By: Zetta Bills M.D.   On: 04/17/2022 10:33     Latest Reference Range & Units Most Recent 06/09/21 08:30 06/25/21 10:24 07/07/21 09:06 07/21/21 08:32 08/04/21 08:54 08/18/21 08:31 09/01/21 08:47 09/25/21 08:29 10/28/21 10:14 11/18/21 14:20 12/08/21 09:06  CEA 0.0 - 4.7 ng/mL 301.0 (H) 12/08/21 09:06 91.6 (H) 84.1 (H) 87.6 (H) 89.1 (H) 108.0 (H) 108.0 (H) 107.0 (H) 105.0 (H) 119.0 (H) 177.0 (H) 301.0 (H)  (H): Data is abnormally high  ASSESSMENT & PLAN:   Cancer of right colon ([facility]) #Right-sided colon adenocarcinoma-with synchronous  metastasis to  liver/unresectable;  Currently on FOLFIRI+ Zira-Bev. S/p Evaluation at [facility] opinion/reviewed the [facility] from [facility]; and also discussed with Dr Dennison Nancy, who agrees with current treatment plan.  Status post 5 cycles of FOLFIRI +BEV- JUNE 29th, 2023- Improving appearance of metastatic disease lung nodules; pleural effusions; metastatic disease in the liver; abdominal soft tissue lesion/lymphadenopathy peritoneal disease [however still with extensive metastatic disease].   #Proceed with cycle #6 of FOLFIRI plus Bev.  labs today reviewed;  acceptable for treatment today. CEA trending down. Improving-see above.  Discussed with the patient that treatments are still palliative/indefinite.  # Nausea/vomitting- sec to chemo G-1; zofran prn.STABLE.    # Right upper quadrant pain-secondary to malignancy-s/p evaluation with [name]; continue oxycodone 5 mg up to 1-2 pills a day as needed.   STABLE.   # Proteinuria: UA +100 protein: JUNE 2023 protein: creatinine ratio:- 0.08- Okay to proceed with BeV.   #Weight loss: sec to malignancy; appetite improved; monitor for now- worse; s/p evaluation with [facility].   STABLE.  # Genetic counseling: 2 brother/sister; 2 biological children. Liquid biopsy/guardant testing/at [facility] [May 2023]-"abnormal" [report not available to me]; s/p  genetic counseling on 6/28- results pending.   #Incidental findings on Imaging  CT scan JUNE , 2023:continued tethering of the small bowel loops adjacent to the ileocolic anastomosis in the RIGHT upper quadrant. No signs of bowel obstruction;  Marked aortic atherosclerosis. 3 cm aneurysm of the infrarenal abdominal aorta;  Mild aneurysmal dilation of the RIGHT common iliac artery;  Aortic Atherosclerosis- I reviewed/discussed/counseled the patient.   # Mediport- functioning-STABLE>  # DISPOSITION:  # Chemo today; pump off on D-3 # follow up in 2 weeks-  MD;labs- cbc/cmp CEA ;urine protein creatinine ratio;  FOLFIRI +Zira-Bev; pump  off D-3;-Dr.B  # I reviewed the blood work- with the patient in detail; also reviewed the imaging independently [as summarized above]; and with the patient in detail.             All questions were answered. The patient knows to call the clinic with any problems, questions or concerns.    Cammie Sickle, MD 04/20/2022 11:06 AM

## 2022-04-21 LAB — CEA: CEA: 86.4 ng/mL — ABNORMAL HIGH (ref 0.0–4.7)

## 2022-04-22 ENCOUNTER — Inpatient Hospital Stay: Payer: Medicare HMO

## 2022-04-22 VITALS — BP 120/75 | HR 74 | Resp 18

## 2022-04-22 DIAGNOSIS — Z7189 Other specified counseling: Secondary | ICD-10-CM

## 2022-04-22 DIAGNOSIS — C182 Malignant neoplasm of ascending colon: Secondary | ICD-10-CM

## 2022-04-22 MED ORDER — SODIUM CHLORIDE 0.9% FLUSH
10.0000 mL | INTRAVENOUS | Status: DC | PRN
  Filled 2022-04-22: qty 10

## 2022-04-22 MED ORDER — HEPARIN SOD (PORK) LOCK FLUSH 100 UNIT/ML IV SOLN
500.0000 [IU] | Freq: Once | INTRAVENOUS | Status: DC | PRN
  Filled 2022-04-22: qty 5

## 2022-04-30 ENCOUNTER — Other Ambulatory Visit: Payer: Self-pay | Admitting: *Deleted

## 2022-04-30 MED ORDER — OXYCODONE HCL 5 MG PO TABS
5.0000 mg | ORAL_TABLET | Freq: Three times a day (TID) | ORAL | 0 refills | Status: DC | PRN
Start: 2022-04-30 — End: 2022-06-03

## 2022-05-04 ENCOUNTER — Inpatient Hospital Stay: Payer: Medicare HMO

## 2022-05-04 ENCOUNTER — Encounter: Payer: Self-pay | Admitting: Internal Medicine

## 2022-05-04 ENCOUNTER — Inpatient Hospital Stay: Payer: Medicare HMO | Admitting: Internal Medicine

## 2022-05-04 DIAGNOSIS — F17210 Nicotine dependence, cigarettes, uncomplicated: Secondary | ICD-10-CM | POA: Diagnosis not present

## 2022-05-04 DIAGNOSIS — Z5112 Encounter for antineoplastic immunotherapy: Secondary | ICD-10-CM | POA: Diagnosis not present

## 2022-05-04 DIAGNOSIS — Z7189 Other specified counseling: Secondary | ICD-10-CM

## 2022-05-04 DIAGNOSIS — Z452 Encounter for adjustment and management of vascular access device: Secondary | ICD-10-CM | POA: Diagnosis not present

## 2022-05-04 DIAGNOSIS — C787 Secondary malignant neoplasm of liver and intrahepatic bile duct: Secondary | ICD-10-CM | POA: Diagnosis not present

## 2022-05-04 DIAGNOSIS — Z5111 Encounter for antineoplastic chemotherapy: Secondary | ICD-10-CM | POA: Diagnosis not present

## 2022-05-04 DIAGNOSIS — C182 Malignant neoplasm of ascending colon: Secondary | ICD-10-CM

## 2022-05-04 DIAGNOSIS — Z79899 Other long term (current) drug therapy: Secondary | ICD-10-CM | POA: Diagnosis not present

## 2022-05-04 LAB — CBC WITH DIFFERENTIAL/PLATELET
Abs Immature Granulocytes: 0.01 10*3/uL (ref 0.00–0.07)
Basophils Absolute: 0.1 10*3/uL (ref 0.0–0.1)
Basophils Relative: 1 %
Eosinophils Absolute: 0.3 10*3/uL (ref 0.0–0.5)
Eosinophils Relative: 7 %
HCT: 34.3 % — ABNORMAL LOW (ref 39.0–52.0)
Hemoglobin: 11.1 g/dL — ABNORMAL LOW (ref 13.0–17.0)
Immature Granulocytes: 0 %
Lymphocytes Relative: 20 %
Lymphs Abs: 0.9 10*3/uL (ref 0.7–4.0)
MCH: 32.7 pg (ref 26.0–34.0)
MCHC: 32.4 g/dL (ref 30.0–36.0)
MCV: 101.2 fL — ABNORMAL HIGH (ref 80.0–100.0)
Monocytes Absolute: 0.5 10*3/uL (ref 0.1–1.0)
Monocytes Relative: 11 %
Neutro Abs: 2.9 10*3/uL (ref 1.7–7.7)
Neutrophils Relative %: 61 %
Platelets: 179 10*3/uL (ref 150–400)
RBC: 3.39 MIL/uL — ABNORMAL LOW (ref 4.22–5.81)
RDW: 16.5 % — ABNORMAL HIGH (ref 11.5–15.5)
WBC: 4.7 10*3/uL (ref 4.0–10.5)
nRBC: 0 % (ref 0.0–0.2)

## 2022-05-04 LAB — COMPREHENSIVE METABOLIC PANEL
ALT: 18 U/L (ref 0–44)
AST: 32 U/L (ref 15–41)
Albumin: 2.7 g/dL — ABNORMAL LOW (ref 3.5–5.0)
Alkaline Phosphatase: 152 U/L — ABNORMAL HIGH (ref 38–126)
Anion gap: 3 — ABNORMAL LOW (ref 5–15)
BUN: 12 mg/dL (ref 8–23)
CO2: 26 mmol/L (ref 22–32)
Calcium: 8.1 mg/dL — ABNORMAL LOW (ref 8.9–10.3)
Chloride: 110 mmol/L (ref 98–111)
Creatinine, Ser: 0.85 mg/dL (ref 0.61–1.24)
GFR, Estimated: >60 mL/min (ref >60)
Glucose, Bld: 139 mg/dL — ABNORMAL HIGH (ref 70–99)
Potassium: 3.9 mmol/L (ref 3.5–5.1)
Sodium: 139 mmol/L (ref 135–145)
Total Bilirubin: 1.1 mg/dL (ref 0.3–1.2)
Total Protein: 6.7 g/dL (ref 6.5–8.1)

## 2022-05-04 MED ORDER — PALONOSETRON HCL INJECTION 0.25 MG/5ML
0.2500 mg | Freq: Once | INTRAVENOUS | Status: AC
  Administered 2022-05-04: 0.25 mg via INTRAVENOUS
  Filled 2022-05-04: qty 5

## 2022-05-04 MED ORDER — SODIUM CHLORIDE 0.9 % IV SOLN
5.0000 mg/kg | Freq: Once | INTRAVENOUS | Status: AC
  Administered 2022-05-04: 350 mg via INTRAVENOUS
  Filled 2022-05-04: qty 14

## 2022-05-04 MED ORDER — SODIUM CHLORIDE 0.9 % IV SOLN
Freq: Once | INTRAVENOUS | Status: AC
  Filled 2022-05-04: qty 250

## 2022-05-04 MED ORDER — SODIUM CHLORIDE 0.9 % IV SOLN
10.0000 mg | Freq: Once | INTRAVENOUS | Status: AC
  Administered 2022-05-04: 10 mg via INTRAVENOUS
  Filled 2022-05-04: qty 1

## 2022-05-04 MED ORDER — SODIUM CHLORIDE 0.9 % IV SOLN
180.0000 mg/m2 | Freq: Once | INTRAVENOUS | Status: AC
  Administered 2022-05-04: 340 mg via INTRAVENOUS
  Filled 2022-05-04: qty 15

## 2022-05-04 MED ORDER — ATROPINE SULFATE 1 MG/ML IV SOLN
0.4000 mg | Freq: Once | INTRAVENOUS | Status: AC
  Administered 2022-05-04: 0.4 mg via INTRAVENOUS
  Filled 2022-05-04: qty 1

## 2022-05-04 MED ORDER — SODIUM CHLORIDE 0.9 % IV SOLN
2400.0000 mg/m2 | INTRAVENOUS | Status: DC
  Administered 2022-05-04: 4500 mg via INTRAVENOUS
  Filled 2022-05-04: qty 90

## 2022-05-04 NOTE — Progress Notes (Signed)
[facility] NOTE  Patient Care Team: Sofie Hartigan, MD as PCP - General (Family Medicine) Cammie Sickle, MD as Consulting Physician (Hematology and Oncology)  CHIEF COMPLAINTS/PURPOSE OF CONSULTATION: Colon cancer  #  Oncology History Overview Note  # MAY 2020- 3. 03/09/19 Liver biopsy. Microscopic examination shows malignant cells with glandular architecture consistent with adenocarcinoma. The malignant cells are positive for CK20 and CDX-2. These findings support the clinical impression of metastatic colon adenocarcinoma. 4. 03/10/19 R hemicolectomy. Tumor site cecum. Adenocarcinoma. Mucinous features present. G2. No tumor deposits. Invades visceral peritoneum. No tumor perforation. LVI present. PNI not identified. All margins uninvolved. 1/12 LNs. PT4apN1. Periappendiceal inflammation c/w resolving abscess. Microsatellite stable (MSS). [Dr.Mettu; [facility]]  # SEP 4th 2020 [compared to May 2020]  Interval increase in size of the metastases to the hepatic dome, The metastasis to the left hepatic lobe is unchanged; 2.  New subcentimeter hypoattenuating lesion in the inferior right hepatic lobe, incompletely characterized on CT. 3.  Postsurgical changes following right hemicolectomy.  # OCT 2020- FOLFOX +avastin; CT dec 22nd 2020- [compared to [facility] sep 9th 2020]-Liver- slight progression versus stable disease; CT scan SEP 4th 2021- Progressive disease-left lower lobe lung nodule 8 mm [previously 4 mm]; increase in size of the hepatic metastases by few millimeters.;  Soft tissue nodule adjacent to anastomotic site again increased by few millimeters.STOP FOLFOX; cont avastin  #  SEP 20th, 2021- FOLFIRI+ AVASTIN; HOLD FEB 2022- sec to poor tolerance [peptic ulcer]; 3/30-liver ablation-   [bland embolization of segment 2/3 liver lesion on 12/19/2020;  microwave ablation of both liver lesions.  # ?July 25th, 2022- BEV _ TAS 102.MAY 2022- s/p Liver ablation  # DEC  2022-progressive disease-lung and liver/abdomen; JAN 31st 2023-START Regorefenib.   # FEB /16-EGD [Dr.Anna-duodenal ulcer-? meloxicam]  # JAN 2022- COVID [s/p Mab infusion; pills; skin rash resolved.]  # NGS/F-ONE-MUTATED K-RAS [G]       Cancer of right colon ([place])  07/05/2019 Initial Diagnosis   Cancer of right colon ([place])   07/24/2019 - 06/25/2020 Chemotherapy   The patient had dexamethasone (DECADRON) 4 MG tablet, 8 mg, Oral, Daily, 1 of 1 cycle, Start date: --, End date: -- palonosetron (ALOXI) injection 0.25 mg, 0.25 mg, Intravenous,  Once, 23 of 25 cycles Administration: 0.25 mg (07/24/2019), 0.25 mg (08/07/2019), 0.25 mg (08/21/2019), 0.25 mg (09/04/2019), 0.25 mg (09/20/2019), 0.25 mg (10/04/2019), 0.25 mg (10/16/2019), 0.25 mg (10/30/2019), 0.25 mg (11/22/2019), 0.25 mg (12/06/2019), 0.25 mg (12/20/2019), 0.25 mg (01/03/2020), 0.25 mg (01/29/2020), 0.25 mg (02/12/2020), 0.25 mg (02/26/2020), 0.25 mg (03/11/2020), 0.25 mg (03/25/2020), 0.25 mg (04/08/2020), 0.25 mg (04/24/2020), 0.25 mg (05/08/2020), 0.25 mg (05/22/2020), 0.25 mg (06/10/2020), 0.25 mg (06/25/2020) leucovorin 800 mg in dextrose 5 % 250 mL infusion, 844 mg, Intravenous,  Once, 23 of 25 cycles Administration: 800 mg (07/24/2019), 800 mg (08/07/2019), 800 mg (08/21/2019), 800 mg (09/04/2019), 800 mg (09/20/2019), 800 mg (10/04/2019), 800 mg (10/16/2019), 800 mg (10/30/2019), 800 mg (11/22/2019), 800 mg (12/06/2019), 800 mg (12/20/2019), 800 mg (01/03/2020), 800 mg (01/29/2020), 800 mg (02/12/2020), 800 mg (02/26/2020), 800 mg (03/11/2020), 800 mg (03/25/2020), 800 mg (04/08/2020), 800 mg (04/24/2020), 800 mg (05/08/2020), 800 mg (05/22/2020), 800 mg (06/10/2020), 800 mg (06/25/2020) oxaliplatin (ELOXATIN) 180 mg in dextrose 5 % 500 mL chemo infusion, 85 mg/m2 = 180 mg, Intravenous,  Once, 23 of 25 cycles Dose modification: 178 mg (original dose 85 mg/m2, Cycle 17, Reason: Other (see comments), Comment: insurance adjusted dose ) Administration: 180 mg (07/24/2019), 180  mg  (08/07/2019), 180 mg (08/21/2019), 180 mg (09/04/2019), 180 mg (09/20/2019), 180 mg (10/04/2019), 180 mg (10/16/2019), 180 mg (10/30/2019), 180 mg (11/22/2019), 180 mg (12/06/2019), 180 mg (12/20/2019), 180 mg (01/03/2020), 180 mg (01/29/2020), 180 mg (02/12/2020), 180 mg (02/26/2020), 180 mg (03/11/2020), 180 mg (04/08/2020), 180 mg (04/24/2020), 180 mg (05/08/2020), 180 mg (05/22/2020), 180 mg (06/10/2020), 180 mg (06/25/2020) fluorouracil (ADRUCIL) 5,000 mg in sodium chloride 0.9 % 150 mL chemo infusion, 5,050 mg, Intravenous, 1 Day/Dose, 23 of 25 cycles Administration: 5,000 mg (07/24/2019), 5,000 mg (08/07/2019), 5,000 mg (08/21/2019), 5,050 mg (09/04/2019), 5,000 mg (10/04/2019), 5,000 mg (10/16/2019), 5,000 mg (11/22/2019), 5,000 mg (12/06/2019), 5,000 mg (12/20/2019), 5,000 mg (01/03/2020), 5,000 mg (01/29/2020), 5,000 mg (02/12/2020), 5,000 mg (02/26/2020), 5,000 mg (03/11/2020), 5,000 mg (03/25/2020), 5,000 mg (04/08/2020), 5,000 mg (04/24/2020), 5,000 mg (05/08/2020), 5,000 mg (05/22/2020), 5,000 mg (06/10/2020), 5,000 mg (06/25/2020) bevacizumab-bvzr (ZIRABEV) 400 mg in sodium chloride 0.9 % 100 mL chemo infusion, 5 mg/kg = 400 mg, Intravenous,  Once, 23 of 25 cycles Administration: 400 mg (08/07/2019), 400 mg (08/21/2019), 400 mg (09/04/2019), 400 mg (09/20/2019), 400 mg (10/04/2019), 400 mg (10/16/2019), 400 mg (10/30/2019), 400 mg (11/22/2019), 400 mg (12/06/2019), 400 mg (12/20/2019), 400 mg (01/03/2020), 400 mg (01/29/2020), 400 mg (02/12/2020), 400 mg (02/26/2020), 400 mg (03/11/2020), 400 mg (03/25/2020), 400 mg (04/08/2020), 400 mg (04/24/2020), 400 mg (05/08/2020), 400 mg (05/22/2020), 400 mg (06/10/2020), 400 mg (06/25/2020)  for chemotherapy treatment.    07/08/2020 -  Chemotherapy   Patient is on Treatment Plan : COLORECTAL FOLFIRI / BEVACIZUMAB Q14D     11/05/2020 Cancer Staging   Staging form: Colon and Rectum, AJCC 8th Edition - Clinical: Stage IVC (pM1c) - Signed by Cammie Sickle, MD on 11/05/2020    HISTORY OF PRESENTING ILLNESS: He is  alone.  Ambulating independently.  Caro Hight 81 y.o.  male with metastatic colon cancer to the liver-s/p multiple lines of therapy -currently on FOLFIRI + Bev is here follow-up/review results of the follow-up.  Patient is currently s/p chemotherapy cycle #6.   No worsening abdominal pain nausea vomiting. Complains of mild nausea-postchemotherapy.  As needed antiemetics.  No worsening diarrhea.  Chronic mild abdominal pain for which he has been taking oxycodone 5 mg up to 1-2 pills a day.   No sores in the mouth.  No rash on palms and soles.    Patient is gaining weight.  States his appetite is good.  Review of Systems  Constitutional:  Positive for malaise/fatigue. Negative for chills, diaphoresis and fever.  HENT:  Negative for nosebleeds and sore throat.   Eyes:  Negative for double vision.  Respiratory:  Negative for cough, hemoptysis, sputum production, shortness of breath and wheezing.   Cardiovascular:  Negative for chest pain, palpitations, orthopnea and leg swelling.  Gastrointestinal:  Negative for blood in stool, constipation, heartburn, melena, nausea and vomiting.  Genitourinary:  Negative for dysuria, frequency and urgency.  Musculoskeletal:  Positive for back pain and joint pain.  Skin: Negative.  Negative for itching and rash.  Neurological:  Positive for tingling. Negative for focal weakness, weakness and headaches.  Endo/Heme/Allergies:  Does not bruise/bleed easily.  Psychiatric/Behavioral:  Negative for depression. The patient is not nervous/anxious and does not have insomnia.      MEDICAL HISTORY:  Past Medical History:  Diagnosis Date   Arthritis    Cancer ([place])    colon cancer 02/2019 per pt    Family history of adverse reaction to anesthesia    cousin took all  night to wake up from anesthesia   H/O colon cancer, stage IV    Hyperlipemia    Neuromuscular disorder (HCC)    Pre-diabetes     SURGICAL HISTORY: Past Surgical History:  Procedure  Laterality Date   COLON SURGERY     ESOPHAGOGASTRODUODENOSCOPY (EGD) WITH PROPOFOL N/A 12/26/2020   Procedure: ESOPHAGOGASTRODUODENOSCOPY (EGD) WITH PROPOFOL;  Surgeon: [name], MD;  Location: [facility] ENDOSCOPY;  Service: Gastroenterology;  Laterality: N/A;   IR ANGIOGRAM SELECTIVE EACH ADDITIONAL VESSEL  12/19/2020   IR ANGIOGRAM SELECTIVE EACH ADDITIONAL VESSEL  03/24/2021   IR ANGIOGRAM SELECTIVE EACH ADDITIONAL VESSEL  03/24/2021   IR ANGIOGRAM SELECTIVE EACH ADDITIONAL VESSEL  04/04/2021   IR ANGIOGRAM SELECTIVE EACH ADDITIONAL VESSEL  04/04/2021   IR ANGIOGRAM SELECTIVE EACH ADDITIONAL VESSEL  04/04/2021   IR ANGIOGRAM VISCERAL SELECTIVE  12/19/2020   IR ANGIOGRAM VISCERAL SELECTIVE  03/24/2021   IR ANGIOGRAM VISCERAL SELECTIVE  04/04/2021   IR ANGIOGRAM VISCERAL SELECTIVE  04/04/2021   IR EMBO ARTERIAL NOT HEMORR HEMANG INC GUIDE ROADMAPPING  03/24/2021   IR EMBO TUMOR ORGAN ISCHEMIA INFARCT INC GUIDE ROADMAPPING  12/19/2020   IR EMBO TUMOR ORGAN ISCHEMIA INFARCT INC GUIDE ROADMAPPING  04/04/2021   IR IMAGING GUIDED PORT INSERTION  07/20/2019   IR RADIOLOGIST EVAL & MGMT  12/05/2020   IR RADIOLOGIST EVAL & MGMT  02/11/2021   IR RADIOLOGIST EVAL & MGMT  03/04/2021   IR RADIOLOGIST EVAL & MGMT  04/29/2021   IR RADIOLOGIST EVAL & MGMT  07/22/2021   IR RADIOLOGIST EVAL & MGMT  11/19/2021   IR US GUIDE VASC ACCESS RIGHT  12/19/2020   IR US GUIDE VASC ACCESS RIGHT  03/24/2021   IR US GUIDE VASC ACCESS RIGHT  04/04/2021   JOINT REPLACEMENT Left 2010   Knee   RADIOLOGY WITH ANESTHESIA N/A 01/15/2021   Procedure: CT WITH ANESTHESIA MICROWAVE ABLATION OF LIVER;  Surgeon: Criselda Peaches, MD;  Location: [facility] ORS;  Service: Anesthesiology;  Laterality: N/A;    SOCIAL HISTORY: Social History   Socioeconomic History   Marital status: Married    Spouse name: Not on file   Number of children: Not on file   Years of education: Not on file   Highest education level: Not on file  Occupational History   Not on file   Tobacco Use   Smoking status: Every Day    Packs/day: 0.25    Years: 15.00    Total pack years: 3.75    Types: Cigarettes   Smokeless tobacco: Never   Tobacco comments:    1 to 2 cigarettes a day occasionally  Vaping Use   Vaping Use: Never used  Substance and Sexual Activity   Alcohol use: Yes    Comment: rarely    Drug use: No   Sexual activity: Not on file  Other Topics Concern   Not on file  Social History Narrative   Recruitment consultant retd; lives in [place]; smoking 3cig/day; [3/4 ppd x started at 9 years]; no alcohol. Son & daughter; wife dementia [waiting for placement].    Social Determinants of Health   Financial Resource Strain: Not on file  Food Insecurity: Not on file  Transportation Needs: Not on file  Physical Activity: Not on file  Stress: Not on file  Social Connections: Not on file  Intimate Partner Violence: Not on file    FAMILY HISTORY: Family History  Problem Relation Age of Onset   Peptic Ulcer Disease Father  ALLERGIES:  is allergic to ace inhibitors.  MEDICATIONS:  Current Outpatient Medications  Medication Sig Dispense Refill   oxyCODONE (OXY IR/ROXICODONE) 5 MG immediate release tablet Take 1 tablet (5 mg total) by mouth every 8 (eight) hours as needed for severe pain. 60 tablet 0   amLODipine (NORVASC) 5 MG tablet Take 5 mg by mouth daily. (Patient not taking: Reported on 10/03/2021)     diphenoxylate-atropine (LOMOTIL) 2.5-0.025 MG tablet Take 1 tablet by mouth 4 (four) times daily as needed for diarrhea or loose stools. Take it along with immodium (Patient not taking: Reported on 02/23/2022) 60 tablet 0   ondansetron (ZOFRAN) 8 MG tablet Take 1 tablet (8 mg total) by mouth every 8 (eight) hours as needed for nausea or vomiting. (Patient not taking: Reported on 03/09/2022) 30 tablet 0   polyethylene glycol powder (GLYCOLAX/MIRALAX) 17 GM/SCOOP powder Take by mouth as needed. (Patient not taking: Reported on 12/29/2021)      pravastatin (PRAVACHOL) 40 MG tablet Take 40 mg by mouth daily.  (Patient not taking: Reported on 06/09/2021)     predniSONE (DELTASONE) 20 MG tablet Take 1 tablet (20 mg total) by mouth daily with breakfast. (Patient not taking: Reported on 02/06/2022) 30 tablet 1   prochlorperazine (COMPAZINE) 10 MG tablet Take 1 tablet (10 mg total) by mouth every 6 (six) hours as needed for nausea or vomiting. (Patient not taking: Reported on 03/09/2022) 60 tablet 0   regorafenib (STIVARGA) 40 MG tablet Take 2 tablets (80 mg total) by mouth daily with breakfast. Take for 21 days, then hold for 7 days. Repeat every 28 days. (Patient not taking: Reported on 02/06/2022) 42 tablet 1   senna (SENOKOT) 8.6 MG TABS tablet Take 1 tablet (8.6 mg total) by mouth daily. (Patient not taking: Reported on 12/29/2021) 120 tablet 0   sucralfate (CARAFATE) 1 g tablet Take 1 tablet (1 g total) by mouth 4 (four) times daily -  with meals and at bedtime. (Patient not taking: Reported on 11/18/2021) 90 tablet 3   tamsulosin (FLOMAX) 0.4 MG CAPS capsule Take 1 capsule (0.4 mg total) by mouth daily. (Patient not taking: Reported on 04/06/2022) 90 capsule 1   No current facility-administered medications for this visit.      Marland Kitchen  PHYSICAL EXAMINATION: ECOG PERFORMANCE STATUS: 0 - Asymptomatic  Vitals:   05/04/22 0844  BP: 110/68  Pulse: 75  Temp: (!) 97.3 F (36.3 C)  SpO2: 100%    Filed Weights   05/04/22 0844  Weight: 153 lb 3.2 oz (69.5 kg)     Physical Exam HENT:     Head: Normocephalic and atraumatic.     Mouth/Throat:     Pharynx: No oropharyngeal exudate.  Eyes:     Pupils: Pupils are equal, round, and reactive to light.  Cardiovascular:     Rate and Rhythm: Normal rate and regular rhythm.  Pulmonary:     Effort: No respiratory distress.     Breath sounds: No wheezing.     Comments: Decreased breath sounds bilaterally.  No wheeze or crackles Abdominal:     General: Bowel sounds are normal. There is no  distension.     Palpations: Abdomen is soft. There is no mass.     Tenderness: There is no abdominal tenderness. There is no guarding or rebound.  Musculoskeletal:        General: No tenderness. Normal range of motion.     Cervical back: Normal range of motion and neck supple.  Skin:  General: Skin is warm.  Neurological:     Mental Status: He is alert and oriented to person, place, and time.  Psychiatric:        Mood and Affect: Affect normal.    LABORATORY DATA:  I have reviewed the data as listed Lab Results  Component Value Date   WBC 4.7 05/04/2022   HGB 11.1 (L) 05/04/2022   HCT 34.3 (L) 05/04/2022   MCV 101.2 (H) 05/04/2022   PLT 179 05/04/2022   Recent Labs    04/06/22 0829 04/20/22 0940 05/04/22 0826  NA 141 138 139  K 3.7 3.9 3.9  CL 109 110 110  CO2 _0 GLUCOSE 136* 138* 139*  BUN _1 CREATININE 0.86 0.84 0.85  CALCIUM 8.2* 8.0* 8.1*  GFRNONAA >60 >60 >60  PROT 6.9 6.3* 6.7  ALBUMIN 2.7* 2.5* 2.7*  AST 33 29 32  ALT _2 ALKPHOS 160* 162* 152*  BILITOT 0.6 0.4 1.1    RADIOGRAPHIC STUDIES: I have personally reviewed the radiological images as listed and agreed with the findings in the report. CT CHEST ABDOMEN PELVIS W CONTRAST  Result Date: 04/17/2022 CLINICAL DATA:  A 81 year old male presents for evaluation of colon cancer, assessment of treatment response. * Tracking Code: BO * EXAM: CT CHEST, ABDOMEN, AND PELVIS WITH CONTRAST TECHNIQUE: Multidetector CT imaging of the chest, abdomen and pelvis was performed following the standard protocol during bolus administration of intravenous contrast. RADIATION DOSE REDUCTION: This exam was performed according to the departmental dose-optimization program which includes automated exposure control, adjustment of the mA and/or kV according to patient size and/or use of iterative reconstruction technique. CONTRAST:  60m OMNIPAQUE IOHEXOL 300 MG/ML  SOLN COMPARISON:  February 09, 2022. FINDINGS: CT  CHEST FINDINGS Cardiovascular: RIGHT-sided Port-A-Cath terminates at the caval to atrial junction as before. Calcified coronary artery disease. No pericardial effusion or nodularity. Aortic atherosclerosis without aneurysm. Central pulmonary vasculature is unremarkable on venous phase. Mediastinum/Nodes: Mild circumferential esophageal thickening is long segment and unchanged. Posterior mediastinal lymph node (image 36/2) 10 mm previously 18 mm short axis. RIGHT hilar lymph node (image 31/2) 18 mm, previously 23 mm. LEFT juxta hilar nodal tissue (image 33/2) 13 mm previously 18 mm. No axillary adenopathy. No new adenopathy in the chest. Lungs/Pleura: Signs of pulmonary metastatic disease with numerous pulmonary nodules. (Image 117/3) 12 mm RIGHT lower lobe pulmonary nodule previously 16 mm. Pleural nodularity throughout the posterior chest with subjective decrease in size of many of these areas. Near confluent pleural nodularity measuring 10 mm greatest thickness (image 67/3) previously 15 mm greatest thickness. Anterior LEFT upper lobe pulmonary nodule (image 38/3) 10 mm previously 10 mm. Bandlike bronchovascular nodularity in the RIGHT chest measuring 11 mm greatest thickness (image 76/3) previously 15 mm. Central bronchovascular area of nodularity (image 81/3) 2.3 x 1.3 cm previously 2.8 x 1.8 cm. Areas extending to the periphery in the LEFT upper lobe are similar to the prior study while the central area has diminished in size. There are no definitive signs of new pulmonary nodules insert 9 greater than a cm. Masslike area about the LEFT hilum also with decreased size. Discrete mass in this area measuring 3.2 cm may have been as large as 4.6 cm previously and there is improved aeration and diminished LEFT-sided pleural fluid. Pleural effusion remains moderate, previously large. LEFT posterior pleural mass measured up to 4.6 cm previously now at 3.6 cm. (Image 47/2) LEFT posterior pleural mass on image 67/2)  8.3  cm greatest axial dimension, previously as large as 9 cm in the axial plane. Musculoskeletal: See below for full musculoskeletal details. CT ABDOMEN PELVIS FINDINGS Hepatobiliary: Confluent LEFT hepatic disease (image 62/2) 5.5 cm previously 6.8 cm. Predominantly low attenuation large lesion in the RIGHT hepatic lobe, hepatic subsegment VIII is stable at approximately 7.5 cm still with low-density currently. The LEFT hepatic lobe lesion with intermediate density. Nodularity along the surface of the liver and along the anterior peritoneum adjacent to the liver is similar. Discrete lesion in the RIGHT hepatic lobe (image 59/2) 14 mm as compared to 20 mm. Other lesions with similar slight decrease in size without definitive new sign of lesion in this patient with multiple hepatic lesions. Stable dilation of the common bile duct. Pancreas: No peripancreatic stranding or ductal dilation with pancreatic atrophy. Spleen: No splenic lesion. Small amount of perisplenic fluid similar to the previous study. Adrenals/Urinary Tract: Adrenal gland on the LEFT is normal, on the RIGHT remains encased by soft tissue which is diminished in size and shows less enhancement. Soft tissue and adenopathy in the RIGHT retroperitoneum measuring approximately 7.6 as compared to 10.3 cm (image 70/2) thickness approximately 3.8 cm as compared to 5.0 cm. In general adenopathy in the upper abdomen with diminished attenuation. Infiltrative process continues to involve the RIGHT kidney but show similar decrease in size. Benign-appearing LEFT renal cyst for which no dedicated imaging follow-up is recommended is unchanged. There is no hydronephrosis. Stomach/Bowel: Colonic stool is moderate. No stranding adjacent to the stomach, small or large bowel. Tethering of small bowel loops in the RIGHT upper quadrant following RIGHT hemicolectomy, adjacent to the anastomotic site measuring 2.9 x 2.2 cm previously 3.5 x 4.1 cm. Vascular/Lymphatic: Aortic  atherosclerosis both calcified and noncalcified plaque similar to prior imaging. 3 cm aneurysm of the infrarenal abdominal aorta with irregular atherosclerotic plaque. Dilation of the RIGHT common iliac artery up to 19 mm is also stable. Patent abdominal vessels. Adenopathy throughout the retroperitoneum with decrease in size following other areas in the abdomen previously discussed also with decreased attenuation. No new signs of adenopathy in either the abdomen or pelvis. Reproductive: Unremarkable by CT. Other: Small volume ascites in the pelvis similar to prior imaging. Nodularity of the anterior peritoneum adjacent to the liver. Small volume fluid about the spleen is similar as well. No pneumoperitoneum. Musculoskeletal: No acute bone finding. No destructive bone process. Spinal degenerative changes. IMPRESSION: 1. Improving appearance of metastatic disease throughout the chest and abdomen as discussed still with extensive metastatic disease. 2. Continued tethering of the small bowel loops adjacent to the ileocolic anastomosis in the RIGHT upper quadrant. No signs of bowel obstruction. 3. Continued signs of peritoneal metastases. 4. Marked aortic atherosclerosis. 3 cm aneurysm of the infrarenal abdominal aorta, attention on follow-up or dedicated follow-up at 3 years as warranted. 5. 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 Aortic Atherosclerosis (ICD10-I70.0). Electronically Signed   By: Zetta Bills M.D.   On: 04/17/2022 10:33     Latest Reference Range & Units Most Recent 06/09/21 08:30 06/25/21 10:24 07/07/21 09:06 07/21/21 08:32 08/04/21 08:54 08/18/21 08:31 09/01/21 08:47 09/25/21 08:29 10/28/21 10:14 11/18/21 14:20 12/08/21 09:06  CEA 0.0 - 4.7 ng/mL 301.0 (H) 12/08/21 09:06 91.6 (H) 84.1 (H) 87.6 (H) 89.1 (H) 108.0 (H) 108.0 (H) 107.0 (H) 105.0 (H) 119.0 (H) 177.0 (H) 301.0 (H)  (H): Data is abnormally high  ASSESSMENT & PLAN:   Cancer of right colon (HCC) #Right-sided colon  adenocarcinoma-with synchronous  metastasis to liver/unresectable;  Currently on FOLFIRI+ Zira-Bev. S/p Evaluation at [facility] opinion/reviewed the note from [facility]; and also discussed with Dr Dennison Nancy, who agrees with current treatment plan.  Status post 5 cycles of FOLFIRI +BEV- JUNE 29th, 2023- Improving appearance of metastatic disease lung nodules; pleural effusions; metastatic disease in the liver; abdominal soft tissue lesion/lymphadenopathy peritoneal disease [however still with extensive metastatic disease].   #Proceed with cycle #7 of FOLFIRI plus Bev.  labs today reviewed;  acceptable for treatment today. CEA trending down. Improving-see above.  # Nausea/vomitting- sec to chemo G-1; zofran prn.STABLE.    # Right upper quadrant pain-secondary to malignancy-s/p evaluation with [name]; continue oxycodone 5 mg up to 1-2 pills a day as needed.   STABLE.    # Proteinuria: UA +100 protein: JULY 2023 protein: creatinine ratio:- 0.02- Okay to proceed with BeV. STABLE.   #Weight loss: sec to malignancy; appetite improved; monitor for now- worse; s/p evaluation with [facility].   STABLE.  # Genetic counseling: 2 brother/sister; 2 biological children. Liquid biopsy/guardant testing/at [facility] [May 2023]-"abnormal" [report not available to me]; s/p  genetic counseling on 7/03 results pending.   # Mediport- functioning-STABLE>  # DISPOSITION:  # Chemo today;DO NOT wait for Urine protein; pump off on D-3 # follow up on AUG 2nd -  MD;labs- cbc/cmp CEA ;urine protein creatinine ratio;  FOLFIRI +Zira-Bev; pump off D-3;-Dr.B             All questions were answered. The patient knows to call the clinic with any problems, questions or concerns.    Cammie Sickle, MD 05/04/2022 9:10 PM

## 2022-05-04 NOTE — Assessment & Plan Note (Addendum)
#  Right-sided colon adenocarcinoma-with synchronous metastasis to liver/unresectable;  Currently on FOLFIRI+ Zira-Bev. S/p Evaluation at [facility] opinion/reviewed the note from [facility]; and also discussed with Dr Dennison Nancy, who agrees with current treatment plan.  Status post 5 cycles of FOLFIRI +BEV- JUNE 29th, 2023- Improving appearance of metastatic disease lung nodules; pleural effusions; metastatic disease in the liver; abdominal soft tissue lesion/lymphadenopathy peritoneal disease [however still with extensive metastatic disease].   #Proceed with cycle #7 of FOLFIRI plus Bev.  labs today reviewed;  acceptable for treatment today. CEA trending down. Improving-see above.  # Nausea/vomitting- sec to chemo G-1; zofran prn.STABLE.    # Right upper quadrant pain-secondary to malignancy-s/p evaluation with [name]; continue oxycodone 5 mg up to 1-2 pills a day as needed.   STABLE.    # Proteinuria: UA +100 protein: JULY 2023 protein: creatinine ratio:- 0.02- Okay to proceed with BeV. STABLE.   #Weight loss: sec to malignancy; appetite improved; monitor for now- worse; s/p evaluation with [facility].   STABLE.  # Genetic counseling: 2 brother/sister; 2 biological children. Liquid biopsy/guardant testing/at [facility] [May 2023]-"abnormal" [report not available to me]; s/p  genetic counseling on 7/03 results pending.   # Mediport- functioning-STABLE>  # DISPOSITION:  # Chemo today;DO NOT wait for Urine protein; pump off on D-3 # follow up on AUG 2nd -  MD;labs- cbc/cmp CEA ;urine protein creatinine ratio;  FOLFIRI +Zira-Bev; pump off D-3;-Dr.B

## 2022-05-04 NOTE — Patient Instructions (Signed)
[facility] CANCER CTR AT [place]  Discharge Instructions: Thank you for choosing [facility] to provide your oncology and hematology care.  If you have a lab appointment with the [facility], please go directly to the [place] and check in at the registration area.  Wear comfortable clothing and clothing appropriate for easy access to any Portacath or PICC line.   We strive to give you quality time with your provider. You may need to reschedule your appointment if you arrive late (15 or more minutes).  Arriving late affects you and other patients whose appointments are after yours.  Also, if you miss three or more appointments without notifying the office, you may be dismissed from the clinic at the provider's discretion.      For prescription refill requests, have your pharmacy contact our office and allow 72 hours for refills to be completed.    Today you received the following chemotherapy and/or immunotherapy agents Zirabev, Camptosar & Adrucil      To help prevent nausea and vomiting after your treatment, we encourage you to take your nausea medication as directed.  BELOW ARE SYMPTOMS THAT SHOULD BE REPORTED IMMEDIATELY: *FEVER GREATER THAN 100.4 F (38 C) OR HIGHER *CHILLS OR SWEATING *NAUSEA AND VOMITING THAT IS NOT CONTROLLED WITH YOUR NAUSEA MEDICATION *UNUSUAL SHORTNESS OF BREATH *UNUSUAL BRUISING OR BLEEDING *URINARY PROBLEMS (pain or burning when urinating, or frequent urination) *BOWEL PROBLEMS (unusual diarrhea, constipation, pain near the anus) TENDERNESS IN MOUTH AND THROAT WITH OR WITHOUT PRESENCE OF ULCERS (sore throat, sores in mouth, or a toothache) UNUSUAL RASH, SWELLING OR PAIN  UNUSUAL VAGINAL DISCHARGE OR ITCHING   Items with * indicate a potential emergency and should be followed up as soon as possible or go to the Emergency Department if any problems should occur.  Please show the CHEMOTHERAPY ALERT CARD or IMMUNOTHERAPY ALERT  CARD at check-in to the Emergency Department and triage nurse.  Should you have questions after your visit or need to cancel or reschedule your appointment, please contact [facility] CANCER [place] AT [place]  [phone] and follow the prompts.  Office hours are 8:00 a.m. to 4:30 p.m. Monday - Friday. Please note that voicemails left after 4:00 p.m. may not be returned until the following business day.  We are closed weekends and major holidays. You have access to a nurse at all times for urgent questions. Please call the main number to the clinic [phone] and follow the prompts.  For any non-urgent questions, you may also contact your provider using MyChart. We now offer e-Visits for anyone 62 and older to request care online for non-urgent symptoms. For details visit [email or web address].   Also download the MyChart app! Go to the app store, search "MyChart", open the app, select [place], and log in with your MyChart username and password.  Masks are optional in the cancer centers. If you would like for your care team to wear a mask while they are taking care of you, please let them know. For doctor visits, patients may have with them one support person who is at least 79 years old. At this time, visitors are not allowed in the infusion area.

## 2022-05-05 LAB — CEA: CEA: 79.8 ng/mL — ABNORMAL HIGH (ref 0.0–4.7)

## 2022-05-06 ENCOUNTER — Inpatient Hospital Stay: Payer: Medicare HMO

## 2022-05-06 VITALS — BP 136/77 | HR 69 | Resp 18

## 2022-05-06 DIAGNOSIS — C182 Malignant neoplasm of ascending colon: Secondary | ICD-10-CM | POA: Diagnosis not present

## 2022-05-06 DIAGNOSIS — Z5111 Encounter for antineoplastic chemotherapy: Secondary | ICD-10-CM | POA: Diagnosis not present

## 2022-05-06 DIAGNOSIS — C787 Secondary malignant neoplasm of liver and intrahepatic bile duct: Secondary | ICD-10-CM | POA: Diagnosis not present

## 2022-05-06 DIAGNOSIS — F17210 Nicotine dependence, cigarettes, uncomplicated: Secondary | ICD-10-CM | POA: Diagnosis not present

## 2022-05-06 DIAGNOSIS — Z452 Encounter for adjustment and management of vascular access device: Secondary | ICD-10-CM | POA: Diagnosis not present

## 2022-05-06 DIAGNOSIS — Z79899 Other long term (current) drug therapy: Secondary | ICD-10-CM | POA: Diagnosis not present

## 2022-05-06 DIAGNOSIS — Z7189 Other specified counseling: Secondary | ICD-10-CM

## 2022-05-06 DIAGNOSIS — Z5112 Encounter for antineoplastic immunotherapy: Secondary | ICD-10-CM | POA: Diagnosis not present

## 2022-05-06 MED ORDER — SODIUM CHLORIDE 0.9% FLUSH
10.0000 mL | INTRAVENOUS | Status: DC | PRN
  Administered 2022-05-06: 10 mL
  Filled 2022-05-06: qty 10

## 2022-05-06 MED ORDER — HEPARIN SOD (PORK) LOCK FLUSH 100 UNIT/ML IV SOLN
500.0000 [IU] | Freq: Once | INTRAVENOUS | Status: AC | PRN
  Administered 2022-05-06: 500 [IU]
  Filled 2022-05-06: qty 5

## 2022-05-12 ENCOUNTER — Telehealth: Payer: Self-pay | Admitting: Licensed Clinical Social Worker

## 2022-05-19 ENCOUNTER — Encounter: Payer: Self-pay | Admitting: Internal Medicine

## 2022-05-19 ENCOUNTER — Encounter: Payer: Self-pay | Admitting: Licensed Clinical Social Worker

## 2022-05-19 ENCOUNTER — Ambulatory Visit: Payer: Self-pay | Admitting: Licensed Clinical Social Worker

## 2022-05-19 DIAGNOSIS — Z1379 Encounter for other screening for genetic and chromosomal anomalies: Secondary | ICD-10-CM | POA: Insufficient documentation

## 2022-05-19 MED FILL — Dexamethasone Sodium Phosphate Inj 100 MG/10ML: INTRAMUSCULAR | Qty: 1 | Status: AC

## 2022-05-19 NOTE — Progress Notes (Signed)
HPI:  [name] was previously seen in the [place] clinic due to a personal and family history of cancer and concerns regarding a hereditary predisposition to cancer. Please refer to our prior cancer genetics clinic note for more information regarding our discussion, assessment and recommendations, at the time. [name] recent genetic test results were disclosed to him, as were recommendations warranted by these results. These results and recommendations are discussed in more detail below.  CANCER HISTORY:  Oncology History Overview Note  # MAY 2020- 3. 03/09/19 Liver biopsy. Microscopic examination shows malignant cells with glandular architecture consistent with adenocarcinoma. The malignant cells are positive for CK20 and CDX-2. These findings support the clinical impression of metastatic colon adenocarcinoma. 4. 03/10/19 R hemicolectomy. Tumor site cecum. Adenocarcinoma. Mucinous features present. G2. No tumor deposits. Invades visceral peritoneum. No tumor perforation. LVI present. PNI not identified. All margins uninvolved. 1/12 LNs. PT4apN1. Periappendiceal inflammation c/w resolving abscess. Microsatellite stable (MSS). [Dr.Mettu; [facility]]  # SEP 4th 2020 [compared to May 2020]  Interval increase in size of the metastases to the hepatic dome, The metastasis to the left hepatic lobe is unchanged; 2.  New subcentimeter hypoattenuating lesion in the inferior right hepatic lobe, incompletely characterized on CT. 3.  Postsurgical changes following right hemicolectomy.  # OCT 2020- FOLFOX +avastin; CT dec 22nd 2020- [compared to [facility] sep 9th 2020]-Liver- slight progression versus stable disease; CT scan SEP 4th 2021- Progressive disease-left lower lobe lung nodule 8 mm [previously 4 mm]; increase in size of the hepatic metastases by few millimeters.;  Soft tissue nodule adjacent to anastomotic site again increased by few millimeters.STOP FOLFOX; cont avastin  #  SEP 20th, 2021-  FOLFIRI+ AVASTIN; HOLD FEB 2022- sec to poor tolerance [peptic ulcer]; 3/30-liver ablation-   [bland embolization of segment 2/3 liver lesion on 12/19/2020;  microwave ablation of both liver lesions.  # ?July 25th, 2022- BEV _ TAS 102.MAY 2022- s/p Liver ablation  # DEC 2022-progressive disease-lung and liver/abdomen; JAN 31st 2023-START Regorefenib.   # FEB /16-EGD [Dr.Anna-duodenal ulcer-? meloxicam]  # JAN 2022- COVID [s/p Mab infusion; pills; skin rash resolved.]  # NGS/F-ONE-MUTATED K-RAS [G]       Cancer of right colon ([place])  07/05/2019 Initial Diagnosis   Cancer of right colon ([place])   07/24/2019 - 06/25/2020 Chemotherapy   The patient had dexamethasone (DECADRON) 4 MG tablet, 8 mg, Oral, Daily, 1 of 1 cycle, Start date: --, End date: -- palonosetron (ALOXI) injection 0.25 mg, 0.25 mg, Intravenous,  Once, 23 of 25 cycles Administration: 0.25 mg (07/24/2019), 0.25 mg (08/07/2019), 0.25 mg (08/21/2019), 0.25 mg (09/04/2019), 0.25 mg (09/20/2019), 0.25 mg (10/04/2019), 0.25 mg (10/16/2019), 0.25 mg (10/30/2019), 0.25 mg (11/22/2019), 0.25 mg (12/06/2019), 0.25 mg (12/20/2019), 0.25 mg (01/03/2020), 0.25 mg (01/29/2020), 0.25 mg (02/12/2020), 0.25 mg (02/26/2020), 0.25 mg (03/11/2020), 0.25 mg (03/25/2020), 0.25 mg (04/08/2020), 0.25 mg (04/24/2020), 0.25 mg (05/08/2020), 0.25 mg (05/22/2020), 0.25 mg (06/10/2020), 0.25 mg (06/25/2020) leucovorin 800 mg in dextrose 5 % 250 mL infusion, 844 mg, Intravenous,  Once, 23 of 25 cycles Administration: 800 mg (07/24/2019), 800 mg (08/07/2019), 800 mg (08/21/2019), 800 mg (09/04/2019), 800 mg (09/20/2019), 800 mg (10/04/2019), 800 mg (10/16/2019), 800 mg (10/30/2019), 800 mg (11/22/2019), 800 mg (12/06/2019), 800 mg (12/20/2019), 800 mg (01/03/2020), 800 mg (01/29/2020), 800 mg (02/12/2020), 800 mg (02/26/2020), 800 mg (03/11/2020), 800 mg (03/25/2020), 800 mg (04/08/2020), 800 mg (04/24/2020), 800 mg (05/08/2020), 800 mg (05/22/2020), 800 mg (06/10/2020), 800 mg (06/25/2020) oxaliplatin (ELOXATIN)  180 mg  in dextrose 5 % 500 mL chemo infusion, 85 mg/m2 = 180 mg, Intravenous,  Once, 23 of 25 cycles Dose modification: 178 mg (original dose 85 mg/m2, Cycle 17, Reason: Other (see comments), Comment: insurance adjusted dose ) Administration: 180 mg (07/24/2019), 180 mg (08/07/2019), 180 mg (08/21/2019), 180 mg (09/04/2019), 180 mg (09/20/2019), 180 mg (10/04/2019), 180 mg (10/16/2019), 180 mg (10/30/2019), 180 mg (11/22/2019), 180 mg (12/06/2019), 180 mg (12/20/2019), 180 mg (01/03/2020), 180 mg (01/29/2020), 180 mg (02/12/2020), 180 mg (02/26/2020), 180 mg (03/11/2020), 180 mg (04/08/2020), 180 mg (04/24/2020), 180 mg (05/08/2020), 180 mg (05/22/2020), 180 mg (06/10/2020), 180 mg (06/25/2020) fluorouracil (ADRUCIL) 5,000 mg in sodium chloride 0.9 % 150 mL chemo infusion, 5,050 mg, Intravenous, 1 Day/Dose, 23 of 25 cycles Administration: 5,000 mg (07/24/2019), 5,000 mg (08/07/2019), 5,000 mg (08/21/2019), 5,050 mg (09/04/2019), 5,000 mg (10/04/2019), 5,000 mg (10/16/2019), 5,000 mg (11/22/2019), 5,000 mg (12/06/2019), 5,000 mg (12/20/2019), 5,000 mg (01/03/2020), 5,000 mg (01/29/2020), 5,000 mg (02/12/2020), 5,000 mg (02/26/2020), 5,000 mg (03/11/2020), 5,000 mg (03/25/2020), 5,000 mg (04/08/2020), 5,000 mg (04/24/2020), 5,000 mg (05/08/2020), 5,000 mg (05/22/2020), 5,000 mg (06/10/2020), 5,000 mg (06/25/2020) bevacizumab-bvzr (ZIRABEV) 400 mg in sodium chloride 0.9 % 100 mL chemo infusion, 5 mg/kg = 400 mg, Intravenous,  Once, 23 of 25 cycles Administration: 400 mg (08/07/2019), 400 mg (08/21/2019), 400 mg (09/04/2019), 400 mg (09/20/2019), 400 mg (10/04/2019), 400 mg (10/16/2019), 400 mg (10/30/2019), 400 mg (11/22/2019), 400 mg (12/06/2019), 400 mg (12/20/2019), 400 mg (01/03/2020), 400 mg (01/29/2020), 400 mg (02/12/2020), 400 mg (02/26/2020), 400 mg (03/11/2020), 400 mg (03/25/2020), 400 mg (04/08/2020), 400 mg (04/24/2020), 400 mg (05/08/2020), 400 mg (05/22/2020), 400 mg (06/10/2020), 400 mg (06/25/2020)  for chemotherapy treatment.    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 NTHL1 is associated with autosomal recessive NTHL1-associated polyposis, meaning [name] is a carrier of this condition and does not have it himself. VUS in RAD50 also identified on the [facility] Multi-Cancer+RNA panel. The report date is 05/11/2022.  The Multi-Cancer Panel + RNA offered by [facility] includes sequencing and/or deletion duplication testing of the following 84 genes: AIP, ALK, APC, ATM, AXIN2,BAP1,  BARD1, BLM, BMPR1A, BRCA1, BRCA2, BRIP1, CASR, CDC73, CDH1, CDK4, CDKN1B, CDKN1C, CDKN2A (p14ARF), CDKN2A (p16INK4a), CEBPA, CHEK2, CTNNA1, DICER1, DIS3L2, EGFR (c.2369C>T, p.Thr790Met variant only), EPCAM (Deletion/duplication testing only), FH, FLCN, GATA2, GPC3, GREM1 (Promoter region deletion/duplication testing only), HOXB13 (c.251G>A, p.Gly84Glu), HRAS, KIT, MAX, MEN1, MET, MITF (c.952G>A, p.Glu318Lys variant only), MLH1, MSH2, MSH3, MSH6, MUTYH, NBN, NF1, NF2, NTHL1, PALB2, PDGFRA, PHOX2B, PMS2, POLD1, POLE, POT1, PRKAR1A, PTCH1, PTEN, RAD50, RAD51C, RAD51D, RB1, RECQL4, RET, RUNX1, SDHAF2, SDHA (sequence changes only), SDHB, SDHC, SDHD, SMAD4, SMARCA4, SMARCB1, SMARCE1, STK11, SUFU, TERC, TERT, TMEM127, TP53, TSC1, TSC2, VHL, WRN and WT1.     FAMILY HISTORY:  We obtained a detailed, 4-generation family history.  Significant diagnoses are listed below: Family History  Problem Relation Age of Onset   Peptic Ulcer Disease Father    [name] has 1 son, 1 daughter, no cancers. He has 2 brothers and 1 sister, no cancers.   [name]'s mother passed at 21. No known cancers on this side of the family.    [name]'s father  passed at 39. He had cancer behind his ear,  possibly skin cancer. No other known cancers on this side of the family.   [name] is unaware of previous family history of genetic testing for hereditary cancer risks. There is no reported Ashkenazi Jewish ancestry. There is no known consanguinity.        GENETIC TEST RESULTS: Genetic testing reported out on 05/11/2022 through the [facility] Multi-Cancer+RNA cancer panel found a single, pathogenic variant in [place] called c.859C>T. The remainder of testing was negative/normal.  The Multi-Cancer Panel + RNA offered by [facility] includes sequencing and/or deletion duplication testing of the following 84 genes: AIP, ALK, APC, ATM, AXIN2,BAP1,  BARD1, BLM, BMPR1A, BRCA1, BRCA2, BRIP1, CASR, CDC73, CDH1, CDK4, CDKN1B, CDKN1C, CDKN2A (p14ARF), CDKN2A (p16INK4a), CEBPA, CHEK2, CTNNA1, DICER1, DIS3L2, EGFR (c.2369C>T, p.Thr790Met variant only), EPCAM (Deletion/duplication testing only), FH, FLCN, GATA2, GPC3, GREM1 (Promoter region deletion/duplication testing only), HOXB13 (c.251G>A, p.Gly84Glu), HRAS, KIT, MAX, MEN1, MET, MITF (c.952G>A, p.Glu318Lys variant only), MLH1, MSH2, MSH3, MSH6, MUTYH, NBN, NF1, NF2, NTHL1, PALB2, PDGFRA, PHOX2B, PMS2, POLD1, POLE, POT1, PRKAR1A, PTCH1, PTEN, RAD50, RAD51C, RAD51D, RB1, RECQL4, RET, RUNX1, SDHAF2, SDHA (sequence changes only), SDHB, SDHC, SDHD, SMAD4, SMARCA4, SMARCB1, SMARCE1, STK11, SUFU, TERC, TERT, TMEM127, TP53, TSC1, TSC2, VHL, WRN and WT1. .   The test report has been scanned into EPIC and is located under the Molecular Pathology section of the Results Review tab.  A portion of the result report is included below for reference.    We discussed that because current genetic testing is not perfect, it is possible there may be a gene mutation in one of these genes that current testing cannot detect, but that chance is small.  There could be another gene that has not yet been discovered, or that we have not yet tested, that is responsible for the cancer diagnoses  in the family. It is also possible there is a hereditary cause for the cancer in the family that [name] did not inherit and therefore was not identified in his testing.  Therefore, it is important to remain in touch with cancer genetics in the future so that we can continue to offer [name] the most up to date genetic testing.   Genetic testing did identify a variant of uncertain significance (VUS) in the RAD50 gene called c.1129C>G.  At this time, it is unknown if this variant is associated with increased cancer risk or if this is a normal finding, but most variants such as this get reclassified to being inconsequential. It should not be used to make medical management decisions. With time, we suspect the lab will determine the significance of this variant, if any. If we do learn more about it we will try to contact [name] to discuss it further. However, it is important to stay in touch with [place] periodically and keep the address and phone number up to date.  NTHL1 carrier  The NTHL1 gene is associated with autosomal recessive NTHL1-associated polyposis. [name] is a carrier for autosomal-recessive NTHL1-associated polyposis. This result is insufficient to cause NTHL1-associated polyposis, however carrier status does impact reproductive risk. Biological relatives have a chance of being a carrier for or being at risk for NTHL1-associated polyposis. The chance of having a child with autosomal recessive NTHL1-associated polyposis depends on the carrier status of this individual's partner. We recommended [name]'s children have testing to find out about their carrier status or if they have NTHL1-associated polyposis.  ADDITIONAL GENETIC TESTING: e discussed with Mr.  Oscar [name] that his genetic testing was fairly extensive.  If there are genes identified to increase cancer risk that can be analyzed in the future, we would be happy to discuss and coordinate this testing at that time.    CANCER  SCREENING RECOMMENDATIONS: [name] test result is considered  essentially negative (normal).  This means that we have not identified a hereditary cause for his  personal and family history of cancer at this time. Most cancers happen by chance and this negative test suggests that his cancer may fall into this category.    While reassuring, this does not definitively rule out a hereditary predisposition to cancer. It is still possible that there could be genetic mutations that are undetectable by current technology. There could be genetic mutations in genes that have not been tested or identified to increase cancer risk.  Therefore, it is recommended he continue to follow the cancer management and screening guidelines provided by his oncology and primary healthcare provider.   An individual's cancer risk and medical management are not determined by genetic test results alone. Overall cancer risk assessment incorporates additional factors, including personal medical history, family history, and any available genetic information that may result in a personalized plan for cancer prevention and surveillance.  RECOMMENDATIONS FOR FAMILY MEMBERS:  Relatives in this family might be at some increased risk of developing cancer, over the general population risk, simply due to the family history of cancer.  We recommended male relatives in this family have a yearly mammogram beginning at age 61, or 51 years younger than the earliest onset of cancer, an annual clinical breast exam, and perform monthly breast self-exams. Male relatives in this family should also have a gynecological exam as recommended by their primary provider.  All family members should be referred for colonoscopy starting at age 61.   FOLLOW-UP: Lastly, we discussed with [name] that cancer genetics is a rapidly advancing field and it is possible that new genetic tests will be appropriate for him and/or his family members in the future. We  encouraged him to remain in contact with cancer genetics on an annual basis so we can update his personal and family histories and let him know of advances in cancer genetics that may benefit this family.   Our contact number was provided. [name] questions were answered to his satisfaction, and he knows he is welcome to call us at anytime with additional questions or concerns.   Faith Rogue, MS, [facility] Genetic Counselor [email or web address] Phone: [phone]

## 2022-05-19 NOTE — Telephone Encounter (Signed)
Revealed pathogenic variant in [place] identified, but this is a recessive condition so Oscar [name] is a carrier of NTHL-associated polyposis but does not have the condition himself. Recommended testing for children.  Revealed that a VUS in RAD50 was identified. This normal result is reassuring and indicates that it is unlikely Oscar [name]'s cancer is due to a hereditary cause.  It is unlikely that there is an increased risk of another cancer due to a mutation in one of these genes.  However, genetic testing is not perfect, and cannot definitively rule out a hereditary cause.  It will be important for him to keep in contact with genetics to learn if any additional testing may be needed in the future.

## 2022-05-20 ENCOUNTER — Inpatient Hospital Stay: Payer: Medicare HMO

## 2022-05-20 ENCOUNTER — Encounter: Payer: Self-pay | Admitting: Internal Medicine

## 2022-05-20 ENCOUNTER — Inpatient Hospital Stay: Payer: Medicare HMO | Admitting: Internal Medicine

## 2022-05-20 ENCOUNTER — Inpatient Hospital Stay: Payer: Medicare HMO | Attending: Internal Medicine

## 2022-05-20 DIAGNOSIS — Z5112 Encounter for antineoplastic immunotherapy: Secondary | ICD-10-CM | POA: Diagnosis not present

## 2022-05-20 DIAGNOSIS — C182 Malignant neoplasm of ascending colon: Secondary | ICD-10-CM | POA: Insufficient documentation

## 2022-05-20 DIAGNOSIS — C787 Secondary malignant neoplasm of liver and intrahepatic bile duct: Secondary | ICD-10-CM | POA: Insufficient documentation

## 2022-05-20 DIAGNOSIS — Z79899 Other long term (current) drug therapy: Secondary | ICD-10-CM | POA: Insufficient documentation

## 2022-05-20 DIAGNOSIS — Z452 Encounter for adjustment and management of vascular access device: Secondary | ICD-10-CM | POA: Diagnosis not present

## 2022-05-20 DIAGNOSIS — Z7189 Other specified counseling: Secondary | ICD-10-CM

## 2022-05-20 DIAGNOSIS — Z5111 Encounter for antineoplastic chemotherapy: Secondary | ICD-10-CM | POA: Insufficient documentation

## 2022-05-20 LAB — COMPREHENSIVE METABOLIC PANEL
ALT: 18 U/L (ref 0–44)
AST: 35 U/L (ref 15–41)
Albumin: 2.9 g/dL — ABNORMAL LOW (ref 3.5–5.0)
Alkaline Phosphatase: 171 U/L — ABNORMAL HIGH (ref 38–126)
Anion gap: 4 — ABNORMAL LOW (ref 5–15)
BUN: 13 mg/dL (ref 8–23)
CO2: 26 mmol/L (ref 22–32)
Calcium: 8.5 mg/dL — ABNORMAL LOW (ref 8.9–10.3)
Chloride: 110 mmol/L (ref 98–111)
Creatinine, Ser: 0.75 mg/dL (ref 0.61–1.24)
GFR, Estimated: >60 mL/min (ref >60)
Glucose, Bld: 158 mg/dL — ABNORMAL HIGH (ref 70–99)
Potassium: 3.8 mmol/L (ref 3.5–5.1)
Sodium: 140 mmol/L (ref 135–145)
Total Bilirubin: 0.8 mg/dL (ref 0.3–1.2)
Total Protein: 7 g/dL (ref 6.5–8.1)

## 2022-05-20 LAB — CBC WITH DIFFERENTIAL/PLATELET
Abs Immature Granulocytes: 0.01 10*3/uL (ref 0.00–0.07)
Basophils Absolute: 0 10*3/uL (ref 0.0–0.1)
Basophils Relative: 1 %
Eosinophils Absolute: 0.3 10*3/uL (ref 0.0–0.5)
Eosinophils Relative: 7 %
HCT: 34.2 % — ABNORMAL LOW (ref 39.0–52.0)
Hemoglobin: 11.1 g/dL — ABNORMAL LOW (ref 13.0–17.0)
Immature Granulocytes: 0 %
Lymphocytes Relative: 24 %
Lymphs Abs: 1 10*3/uL (ref 0.7–4.0)
MCH: 32.8 pg (ref 26.0–34.0)
MCHC: 32.5 g/dL (ref 30.0–36.0)
MCV: 101.2 fL — ABNORMAL HIGH (ref 80.0–100.0)
Monocytes Absolute: 0.6 10*3/uL (ref 0.1–1.0)
Monocytes Relative: 13 %
Neutro Abs: 2.4 10*3/uL (ref 1.7–7.7)
Neutrophils Relative %: 55 %
Platelets: 206 10*3/uL (ref 150–400)
RBC: 3.38 MIL/uL — ABNORMAL LOW (ref 4.22–5.81)
RDW: 16 % — ABNORMAL HIGH (ref 11.5–15.5)
WBC: 4.3 10*3/uL (ref 4.0–10.5)
nRBC: 0 % (ref 0.0–0.2)

## 2022-05-20 LAB — PROTEIN / CREATININE RATIO, URINE
Creatinine, Urine: 79 mg/dL
Protein Creatinine Ratio: 0.14 mg/mg{Cre} (ref 0.00–0.15)
Total Protein, Urine: 11 mg/dL

## 2022-05-20 MED ORDER — SODIUM CHLORIDE 0.9 % IV SOLN
5.0000 mg/kg | Freq: Once | INTRAVENOUS | Status: AC
  Administered 2022-05-20: 350 mg via INTRAVENOUS
  Filled 2022-05-20: qty 14

## 2022-05-20 MED ORDER — PALONOSETRON HCL INJECTION 0.25 MG/5ML
0.2500 mg | Freq: Once | INTRAVENOUS | Status: AC
  Administered 2022-05-20: 0.25 mg via INTRAVENOUS
  Filled 2022-05-20: qty 5

## 2022-05-20 MED ORDER — SODIUM CHLORIDE 0.9 % IV SOLN
10.0000 mg | Freq: Once | INTRAVENOUS | Status: AC
  Administered 2022-05-20: 10 mg via INTRAVENOUS
  Filled 2022-05-20: qty 10

## 2022-05-20 MED ORDER — SODIUM CHLORIDE 0.9 % IV SOLN
180.0000 mg/m2 | Freq: Once | INTRAVENOUS | Status: AC
  Administered 2022-05-20: 340 mg via INTRAVENOUS
  Filled 2022-05-20: qty 15

## 2022-05-20 MED ORDER — SODIUM CHLORIDE 0.9 % IV SOLN
Freq: Once | INTRAVENOUS | Status: AC
  Filled 2022-05-20: qty 250

## 2022-05-20 MED ORDER — SODIUM CHLORIDE 0.9 % IV SOLN
2400.0000 mg/m2 | INTRAVENOUS | Status: DC
  Administered 2022-05-20: 4500 mg via INTRAVENOUS
  Filled 2022-05-20: qty 90

## 2022-05-20 MED ORDER — ATROPINE SULFATE 1 MG/ML IV SOLN
0.4000 mg | Freq: Once | INTRAVENOUS | Status: AC
  Administered 2022-05-20: 0.4 mg via INTRAVENOUS
  Filled 2022-05-20: qty 1

## 2022-05-20 NOTE — Patient Instructions (Signed)
[facility] AT [place]-MEDICAL ONCOLOGY  Discharge Instructions: Thank you for choosing [facility] to provide your oncology and hematology care.  If you have a lab appointment with the Cancer Center, please go directly to the Cancer Center and check in at the registration area.  Wear comfortable clothing and clothing appropriate for easy access to any Portacath or PICC line.   We strive to give you quality time with your provider. You may need to reschedule your appointment if you arrive late (15 or more minutes).  Arriving late affects you and other patients whose appointments are after yours.  Also, if you miss three or more appointments without notifying the office, you may be dismissed from the clinic at the provider's discretion.      For prescription refill requests, have your pharmacy contact our office and allow 72 hours for refills to be completed.       To help prevent nausea and vomiting after your treatment, we encourage you to take your nausea medication as directed.  BELOW ARE SYMPTOMS THAT SHOULD BE REPORTED IMMEDIATELY: *FEVER GREATER THAN 100.4 F (38 C) OR HIGHER *CHILLS OR SWEATING *NAUSEA AND VOMITING THAT IS NOT CONTROLLED WITH YOUR NAUSEA MEDICATION *UNUSUAL SHORTNESS OF BREATH *UNUSUAL BRUISING OR BLEEDING *URINARY PROBLEMS (pain or burning when urinating, or frequent urination) *BOWEL PROBLEMS (unusual diarrhea, constipation, pain near the anus) TENDERNESS IN MOUTH AND THROAT WITH OR WITHOUT PRESENCE OF ULCERS (sore throat, sores in mouth, or a toothache) UNUSUAL RASH, SWELLING OR PAIN  UNUSUAL VAGINAL DISCHARGE OR ITCHING   Items with * indicate a potential emergency and should be followed up as soon as possible or go to the Emergency Department if any problems should occur.  Please show the CHEMOTHERAPY ALERT CARD or IMMUNOTHERAPY ALERT CARD at check-in to the Emergency Department and triage nurse.  Should you have questions after your  visit or need to cancel or reschedule your appointment, please contact [facility] AT [place]-MEDICAL ONCOLOGY  [phone] and follow the prompts.  Office hours are 8:00 a.m. to 4:30 p.m. Monday - Friday. Please note that voicemails left after 4:00 p.m. may not be returned until the following business day.  We are closed weekends and major holidays. You have access to a nurse at all times for urgent questions. Please call the main number to the clinic [phone] and follow the prompts.  For any non-urgent questions, you may also contact your provider using MyChart. We now offer e-Visits for anyone 18 and older to request care online for non-urgent symptoms. For details visit [email or web address].   Also download the MyChart app! Go to the app store, search "MyChart", open the app, select [place], and log in with your MyChart username and password.  Masks are optional in the cancer centers. If you would like for your care team to wear a mask while they are taking care of you, please let them know. For doctor visits, patients may have with them one support person who is at least 79 years old. At this time, visitors are not allowed in the infusion area.   

## 2022-05-20 NOTE — Progress Notes (Signed)
[facility] NOTE  Patient Care Team: Sofie Hartigan, MD as PCP - General (Family Medicine) Cammie Sickle, MD as Consulting Physician (Hematology and Oncology)  CHIEF COMPLAINTS/PURPOSE OF CONSULTATION: Colon cancer  #  Oncology History Overview Note  # MAY 2020- 3. 03/09/19 Liver biopsy. Microscopic examination shows malignant cells with glandular architecture consistent with adenocarcinoma. The malignant cells are positive for CK20 and CDX-2. These findings support the clinical impression of metastatic colon adenocarcinoma. 4. 03/10/19 R hemicolectomy. Tumor site cecum. Adenocarcinoma. Mucinous features present. G2. No tumor deposits. Invades visceral peritoneum. No tumor perforation. LVI present. PNI not identified. All margins uninvolved. 1/12 LNs. PT4apN1. Periappendiceal inflammation c/w resolving abscess. Microsatellite stable (MSS). [Dr.Mettu; [facility]]  # SEP 4th 2020 [compared to May 2020]  Interval increase in size of the metastases to the hepatic dome, The metastasis to the left hepatic lobe is unchanged; 2.  New subcentimeter hypoattenuating lesion in the inferior right hepatic lobe, incompletely characterized on CT. 3.  Postsurgical changes following right hemicolectomy.  # OCT 2020- FOLFOX +avastin; CT dec 22nd 2020- [compared to [facility] sep 9th 2020]-Liver- slight progression versus stable disease; CT scan SEP 4th 2021- Progressive disease-left lower lobe lung nodule 8 mm [previously 4 mm]; increase in size of the hepatic metastases by few millimeters.;  Soft tissue nodule adjacent to anastomotic site again increased by few millimeters.STOP FOLFOX; cont avastin  #  SEP 20th, 2021- FOLFIRI+ AVASTIN; HOLD FEB 2022- sec to poor tolerance [peptic ulcer]; 3/30-liver ablation-   [bland embolization of segment 2/3 liver lesion on 12/19/2020;  microwave ablation of both liver lesions.  # ?July 25th, 2022- BEV _ TAS 102.MAY 2022- s/p Liver ablation  # DEC  2022-progressive disease-lung and liver/abdomen; JAN 31st 2023-START Regorefenib.   # FEB /16-EGD [Dr.Anna-duodenal ulcer-? meloxicam]  # JAN 2022- COVID [s/p Mab infusion; pills; skin rash resolved.]  # NGS/F-ONE-MUTATED K-RAS [G]       Cancer of right colon ([place])  07/05/2019 Initial Diagnosis   Cancer of right colon ([place])   07/24/2019 - 06/25/2020 Chemotherapy   The patient had dexamethasone (DECADRON) 4 MG tablet, 8 mg, Oral, Daily, 1 of 1 cycle, Start date: --, End date: -- palonosetron (ALOXI) injection 0.25 mg, 0.25 mg, Intravenous,  Once, 23 of 25 cycles Administration: 0.25 mg (07/24/2019), 0.25 mg (08/07/2019), 0.25 mg (08/21/2019), 0.25 mg (09/04/2019), 0.25 mg (09/20/2019), 0.25 mg (10/04/2019), 0.25 mg (10/16/2019), 0.25 mg (10/30/2019), 0.25 mg (11/22/2019), 0.25 mg (12/06/2019), 0.25 mg (12/20/2019), 0.25 mg (01/03/2020), 0.25 mg (01/29/2020), 0.25 mg (02/12/2020), 0.25 mg (02/26/2020), 0.25 mg (03/11/2020), 0.25 mg (03/25/2020), 0.25 mg (04/08/2020), 0.25 mg (04/24/2020), 0.25 mg (05/08/2020), 0.25 mg (05/22/2020), 0.25 mg (06/10/2020), 0.25 mg (06/25/2020) leucovorin 800 mg in dextrose 5 % 250 mL infusion, 844 mg, Intravenous,  Once, 23 of 25 cycles Administration: 800 mg (07/24/2019), 800 mg (08/07/2019), 800 mg (08/21/2019), 800 mg (09/04/2019), 800 mg (09/20/2019), 800 mg (10/04/2019), 800 mg (10/16/2019), 800 mg (10/30/2019), 800 mg (11/22/2019), 800 mg (12/06/2019), 800 mg (12/20/2019), 800 mg (01/03/2020), 800 mg (01/29/2020), 800 mg (02/12/2020), 800 mg (02/26/2020), 800 mg (03/11/2020), 800 mg (03/25/2020), 800 mg (04/08/2020), 800 mg (04/24/2020), 800 mg (05/08/2020), 800 mg (05/22/2020), 800 mg (06/10/2020), 800 mg (06/25/2020) oxaliplatin (ELOXATIN) 180 mg in dextrose 5 % 500 mL chemo infusion, 85 mg/m2 = 180 mg, Intravenous,  Once, 23 of 25 cycles Dose modification: 178 mg (original dose 85 mg/m2, Cycle 17, Reason: Other (see comments), Comment: insurance adjusted dose ) Administration: 180 mg (07/24/2019), 180  mg  (08/07/2019), 180 mg (08/21/2019), 180 mg (09/04/2019), 180 mg (09/20/2019), 180 mg (10/04/2019), 180 mg (10/16/2019), 180 mg (10/30/2019), 180 mg (11/22/2019), 180 mg (12/06/2019), 180 mg (12/20/2019), 180 mg (01/03/2020), 180 mg (01/29/2020), 180 mg (02/12/2020), 180 mg (02/26/2020), 180 mg (03/11/2020), 180 mg (04/08/2020), 180 mg (04/24/2020), 180 mg (05/08/2020), 180 mg (05/22/2020), 180 mg (06/10/2020), 180 mg (06/25/2020) fluorouracil (ADRUCIL) 5,000 mg in sodium chloride 0.9 % 150 mL chemo infusion, 5,050 mg, Intravenous, 1 Day/Dose, 23 of 25 cycles Administration: 5,000 mg (07/24/2019), 5,000 mg (08/07/2019), 5,000 mg (08/21/2019), 5,050 mg (09/04/2019), 5,000 mg (10/04/2019), 5,000 mg (10/16/2019), 5,000 mg (11/22/2019), 5,000 mg (12/06/2019), 5,000 mg (12/20/2019), 5,000 mg (01/03/2020), 5,000 mg (01/29/2020), 5,000 mg (02/12/2020), 5,000 mg (02/26/2020), 5,000 mg (03/11/2020), 5,000 mg (03/25/2020), 5,000 mg (04/08/2020), 5,000 mg (04/24/2020), 5,000 mg (05/08/2020), 5,000 mg (05/22/2020), 5,000 mg (06/10/2020), 5,000 mg (06/25/2020) bevacizumab-bvzr (ZIRABEV) 400 mg in sodium chloride 0.9 % 100 mL chemo infusion, 5 mg/kg = 400 mg, Intravenous,  Once, 23 of 25 cycles Administration: 400 mg (08/07/2019), 400 mg (08/21/2019), 400 mg (09/04/2019), 400 mg (09/20/2019), 400 mg (10/04/2019), 400 mg (10/16/2019), 400 mg (10/30/2019), 400 mg (11/22/2019), 400 mg (12/06/2019), 400 mg (12/20/2019), 400 mg (01/03/2020), 400 mg (01/29/2020), 400 mg (02/12/2020), 400 mg (02/26/2020), 400 mg (03/11/2020), 400 mg (03/25/2020), 400 mg (04/08/2020), 400 mg (04/24/2020), 400 mg (05/08/2020), 400 mg (05/22/2020), 400 mg (06/10/2020), 400 mg (06/25/2020)  for chemotherapy treatment.    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 NTHL1 is associated with autosomal recessive NTHL1-associated polyposis, meaning Mr. Lundberg is a carrier of this condition and does not have it himself. VUS in RAD50 also identified on the [facility] Multi-Cancer+RNA panel. The report date is 05/11/2022.  The Multi-Cancer Panel + RNA offered by [facility] includes sequencing and/or deletion duplication testing of the following 84 genes: AIP, ALK, APC, ATM, AXIN2,BAP1,  BARD1, BLM, BMPR1A, BRCA1, BRCA2, BRIP1, CASR, CDC73, CDH1, CDK4, CDKN1B, CDKN1C, CDKN2A (p14ARF), CDKN2A (p16INK4a), CEBPA, CHEK2, CTNNA1, DICER1, DIS3L2, EGFR (c.2369C>T, p.Thr790Met variant only), EPCAM (Deletion/duplication testing only), FH, FLCN, GATA2, GPC3, GREM1 (Promoter region deletion/duplication testing only), HOXB13 (c.251G>A, p.Gly84Glu), HRAS, KIT, MAX, MEN1, MET, MITF (c.952G>A, p.Glu318Lys variant only), MLH1, MSH2, MSH3, MSH6, MUTYH, NBN, NF1, NF2, NTHL1, PALB2, PDGFRA, PHOX2B, PMS2, POLD1, POLE, POT1, PRKAR1A, PTCH1, PTEN, RAD50, RAD51C, RAD51D, RB1, RECQL4, RET, RUNX1, SDHAF2, SDHA (sequence changes only), SDHB, SDHC, SDHD, SMAD4, SMARCA4, SMARCB1, SMARCE1, STK11, SUFU, TERC, TERT, TMEM127, TP53, TSC1, TSC2, VHL, WRN and WT1.    HISTORY OF PRESENTING ILLNESS: He is alone.  Ambulating independently.  Oscar [name] 81 y.o.  male with metastatic colon cancer to the liver-s/p multiple lines of therapy -currently on FOLFIRI + Bev is here follow-up/review results of the follow-up.  Denies any worsening abdominal pain-however continues to take oxycodone 5 mg 1 to 2 pills a day.  No stools normal.  No diarrhea.  No weight loss.  Patient is gaining weight.  States his appetite is good.  Review of Systems  Constitutional:  Positive for malaise/fatigue. Negative for chills, diaphoresis and fever.  HENT:  Negative for nosebleeds and sore throat.   Eyes:  Negative for double vision.  Respiratory:  Negative  for cough, hemoptysis, sputum  production, shortness of breath and wheezing.   Cardiovascular:  Negative for chest pain, palpitations, orthopnea and leg swelling.  Gastrointestinal:  Negative for blood in stool, constipation, heartburn, melena, nausea and vomiting.  Genitourinary:  Negative for dysuria, frequency and urgency.  Musculoskeletal:  Positive for back pain and joint pain.  Skin: Negative.  Negative for itching and rash.  Neurological:  Positive for tingling. Negative for focal weakness, weakness and headaches.  Endo/Heme/Allergies:  Does not bruise/bleed easily.  Psychiatric/Behavioral:  Negative for depression. The patient is not nervous/anxious and does not have insomnia.      MEDICAL HISTORY:  Past Medical History:  Diagnosis Date   Arthritis    Cancer ([place])    colon cancer 02/2019 per pt    Family history of adverse reaction to anesthesia    cousin took all night to wake up from anesthesia   H/O colon cancer, stage IV    Hyperlipemia    Neuromuscular disorder ([place])    Pre-diabetes     SURGICAL HISTORY: Past Surgical History:  Procedure Laterality Date   COLON SURGERY     ESOPHAGOGASTRODUODENOSCOPY (EGD) WITH PROPOFOL N/A 12/26/2020   Procedure: ESOPHAGOGASTRODUODENOSCOPY (EGD) WITH PROPOFOL;  Surgeon: [name], MD;  Location: [facility] ENDOSCOPY;  Service: Gastroenterology;  Laterality: N/A;   IR ANGIOGRAM SELECTIVE EACH ADDITIONAL VESSEL  12/19/2020   IR ANGIOGRAM SELECTIVE EACH ADDITIONAL VESSEL  03/24/2021   IR ANGIOGRAM SELECTIVE EACH ADDITIONAL VESSEL  03/24/2021   IR ANGIOGRAM SELECTIVE EACH ADDITIONAL VESSEL  04/04/2021   IR ANGIOGRAM SELECTIVE EACH ADDITIONAL VESSEL  04/04/2021   IR ANGIOGRAM SELECTIVE EACH ADDITIONAL VESSEL  04/04/2021   IR ANGIOGRAM VISCERAL SELECTIVE  12/19/2020   IR ANGIOGRAM VISCERAL SELECTIVE  03/24/2021   IR ANGIOGRAM VISCERAL SELECTIVE  04/04/2021   IR ANGIOGRAM VISCERAL SELECTIVE  04/04/2021   IR EMBO ARTERIAL NOT HEMORR HEMANG INC GUIDE ROADMAPPING  03/24/2021   IR EMBO TUMOR  ORGAN ISCHEMIA INFARCT INC GUIDE ROADMAPPING  12/19/2020   IR EMBO TUMOR ORGAN ISCHEMIA INFARCT INC GUIDE ROADMAPPING  04/04/2021   IR IMAGING GUIDED PORT INSERTION  07/20/2019   IR RADIOLOGIST EVAL & MGMT  12/05/2020   IR RADIOLOGIST EVAL & MGMT  02/11/2021   IR RADIOLOGIST EVAL & MGMT  03/04/2021   IR RADIOLOGIST EVAL & MGMT  04/29/2021   IR RADIOLOGIST EVAL & MGMT  07/22/2021   IR RADIOLOGIST EVAL & MGMT  11/19/2021   IR US GUIDE VASC ACCESS RIGHT  12/19/2020   IR US GUIDE VASC ACCESS RIGHT  03/24/2021   IR US GUIDE VASC ACCESS RIGHT  04/04/2021   JOINT REPLACEMENT Left 2010   Knee   RADIOLOGY WITH ANESTHESIA N/A 01/15/2021   Procedure: CT WITH ANESTHESIA MICROWAVE ABLATION OF LIVER;  Surgeon: Criselda Peaches, MD;  Location: [facility] ORS;  Service: Anesthesiology;  Laterality: N/A;    SOCIAL HISTORY: Social History   Socioeconomic History   Marital status: Married    Spouse name: Not on file   Number of children: Not on file   Years of education: Not on file   Highest education level: Not on file  Occupational History   Not on file  Tobacco Use   Smoking status: Every Day    Packs/day: 0.25    Years: 15.00    Total pack years: 3.75    Types: Cigarettes   Smokeless tobacco: Never   Tobacco comments:    1 to 2 cigarettes a day occasionally  Vaping Use  Vaping Use: Never used  Substance and Sexual Activity   Alcohol use: Yes    Comment: rarely    Drug use: No   Sexual activity: Not on file  Other Topics Concern   Not on file  Social History Narrative   Public affairs consultant retd; lives in [place]; smoking 3cig/day; [3/4 ppd x started at 9 years]; no alcohol. Son & daughter; wife dementia [waiting for placement].    Social Determinants of Health   Financial Resource Strain: Not on file  Food Insecurity: Not on file  Transportation Needs: Not on file  Physical Activity: Not on file  Stress: Not on file  Social Connections: Not on file  Intimate Partner Violence: Not on  file    FAMILY HISTORY: Family History  Problem Relation Age of Onset   Peptic Ulcer Disease Father     ALLERGIES:  is allergic to ace inhibitors.  MEDICATIONS:  Current Outpatient Medications  Medication Sig Dispense Refill   oxyCODONE (OXY IR/ROXICODONE) 5 MG immediate release tablet Take 1 tablet (5 mg total) by mouth every 8 (eight) hours as needed for severe pain. 60 tablet 0   amLODipine (NORVASC) 5 MG tablet Take 5 mg by mouth daily. (Patient not taking: Reported on 10/03/2021)     diphenoxylate-atropine (LOMOTIL) 2.5-0.025 MG tablet Take 1 tablet by mouth 4 (four) times daily as needed for diarrhea or loose stools. Take it along with immodium (Patient not taking: Reported on 02/23/2022) 60 tablet 0   ondansetron (ZOFRAN) 8 MG tablet Take 1 tablet (8 mg total) by mouth every 8 (eight) hours as needed for nausea or vomiting. (Patient not taking: Reported on 03/09/2022) 30 tablet 0   polyethylene glycol powder (GLYCOLAX/MIRALAX) 17 GM/SCOOP powder Take by mouth as needed. (Patient not taking: Reported on 12/29/2021)     pravastatin (PRAVACHOL) 40 MG tablet Take 40 mg by mouth daily.  (Patient not taking: Reported on 06/09/2021)     predniSONE (DELTASONE) 20 MG tablet Take 1 tablet (20 mg total) by mouth daily with breakfast. (Patient not taking: Reported on 02/06/2022) 30 tablet 1   prochlorperazine (COMPAZINE) 10 MG tablet Take 1 tablet (10 mg total) by mouth every 6 (six) hours as needed for nausea or vomiting. (Patient not taking: Reported on 03/09/2022) 60 tablet 0   regorafenib (STIVARGA) 40 MG tablet Take 2 tablets (80 mg total) by mouth daily with breakfast. Take for 21 days, then hold for 7 days. Repeat every 28 days. (Patient not taking: Reported on 02/06/2022) 42 tablet 1   senna (SENOKOT) 8.6 MG TABS tablet Take 1 tablet (8.6 mg total) by mouth daily. (Patient not taking: Reported on 12/29/2021) 120 tablet 0   sucralfate (CARAFATE) 1 g tablet Take 1 tablet (1 g total) by mouth 4 (four)  times daily -  with meals and at bedtime. (Patient not taking: Reported on 11/18/2021) 90 tablet 3   tamsulosin (FLOMAX) 0.4 MG CAPS capsule Take 1 capsule (0.4 mg total) by mouth daily. (Patient not taking: Reported on 04/06/2022) 90 capsule 1   No current facility-administered medications for this visit.      Marland Kitchen  PHYSICAL EXAMINATION: ECOG PERFORMANCE STATUS: 0 - Asymptomatic  Vitals:   05/20/22 0838  BP: 117/71  Pulse: 74  Temp: (!) 96.7 F (35.9 C)  SpO2: 100%    Filed Weights   05/20/22 0838  Weight: 152 lb 6.4 oz (69.1 kg)     Physical Exam HENT:     Head: Normocephalic and atraumatic.  Mouth/Throat:     Pharynx: No oropharyngeal exudate.  Eyes:     Pupils: Pupils are equal, round, and reactive to light.  Cardiovascular:     Rate and Rhythm: Normal rate and regular rhythm.  Pulmonary:     Effort: No respiratory distress.     Breath sounds: No wheezing.     Comments: Decreased breath sounds bilaterally.  No wheeze or crackles Abdominal:     General: Bowel sounds are normal. There is no distension.     Palpations: Abdomen is soft. There is no mass.     Tenderness: There is no abdominal tenderness. There is no guarding or rebound.  Musculoskeletal:        General: No tenderness. Normal range of motion.     Cervical back: Normal range of motion and neck supple.  Skin:    General: Skin is warm.  Neurological:     Mental Status: He is alert and oriented to person, place, and time.  Psychiatric:        Mood and Affect: Affect normal.    LABORATORY DATA:  I have reviewed the data as listed Lab Results  Component Value Date   WBC 4.3 05/20/2022   HGB 11.1 (L) 05/20/2022   HCT 34.2 (L) 05/20/2022   MCV 101.2 (H) 05/20/2022   PLT 206 05/20/2022   Recent Labs    04/20/22 0940 05/04/22 0826 05/20/22 0819  NA 138 139 140  K 3.9 3.9 3.8  CL 110 110 110  CO2 $Re'25 26 26  'fcd$ GLUCOSE 138* 139* 158*  BUN $Re'14 12 13  'wxr$ CREATININE 0.84 0.85 0.75  CALCIUM 8.0* 8.1*  8.5*  GFRNONAA >60 >60 >60  PROT 6.3* 6.7 7.0  ALBUMIN 2.5* 2.7* 2.9*  AST 29 32 35  ALT $Re'19 18 18  'iCQ$ ALKPHOS 162* 152* 171*  BILITOT 0.4 1.1 0.8    RADIOGRAPHIC STUDIES: I have personally reviewed the radiological images as listed and agreed with the findings in the report. No results found.   Latest Reference Range & Units Most Recent 06/09/21 08:30 06/25/21 10:24 07/07/21 09:06 07/21/21 08:32 08/04/21 08:54 08/18/21 08:31 09/01/21 08:47 09/25/21 08:29 10/28/21 10:14 11/18/21 14:20 12/08/21 09:06  CEA 0.0 - 4.7 ng/mL 301.0 (H) 12/08/21 09:06 91.6 (H) 84.1 (H) 87.6 (H) 89.1 (H) 108.0 (H) 108.0 (H) 107.0 (H) 105.0 (H) 119.0 (H) 177.0 (H) 301.0 (H)  (H): Data is abnormally high  ASSESSMENT & PLAN:   Cancer of right colon ([facility]) #Right-sided colon adenocarcinoma-with synchronous metastasis to liver/unresectable;  Currently on FOLFIRI+ Zira-Bev. S/p Evaluation at [facility] opinion/reviewed the note from [facility]; and also discussed with Dr Dennison Nancy, who agrees with current treatment plan.  Status post 5 cycles of FOLFIRI +BEV- JUNE 29th, 2023- Improving appearance of metastatic disease lung nodules; pleural effusions; metastatic disease in the liver; abdominal soft tissue lesion/lymphadenopathy peritoneal disease [however still with extensive metastatic disease].   #Proceed with cycle #8 of FOLFIRI plus Bev.  labs today reviewed;  acceptable for treatment today. CEA trending down. Improving-see above.  # Nausea/vomitting- sec to chemo G-1; zofran prn.STABLE.    # Right upper quadrant pain-secondary to malignancy-s/p evaluation with [name]; continue oxycodone 5 mg up to 1-2 pills a day as needed.   STABLE.    # Proteinuria: UA +100 protein: JULY 2023 protein: creatinine ratio:- 0.02- Okay to proceed with BeV. STABLE.   #Weight loss: sec to malignancy; appetite improved; monitor for now- worse; s/p evaluation with [facility]-STABLE.   # Genetic counseling: 2  brother/sister; 2 biological  children.s/p  genetic counseling- pathogenic variant in [place] called c.859C>T. discussed with patient re: genetic counselling for family.   # Mediport- functioning-STABLE.   # DISPOSITION:   #  FOLFIRI +Zira-Bev today;DO NOT wait for Urine protein; pump off on D-3  # follow up IN 2 WEEKS-  MD;labs- cbc/cmp CEA ;urine protein creatinine ratio;  FOLFIRI +Zira-Bev; pump off D-3;-Dr.B             All questions were answered. The patient knows to call the clinic with any problems, questions or concerns.    Cammie Sickle, MD 05/20/2022 8:59 AM

## 2022-05-20 NOTE — Patient Instructions (Signed)
[facility] CANCER CTR AT [place]  Discharge Instructions: Thank you for choosing [facility] to provide your oncology and hematology care.  If you have a lab appointment with the [facility], please go directly to the [facility] and check in at the registration area.  Wear comfortable clothing and clothing appropriate for easy access to any Portacath or PICC line.     To help prevent nausea and vomiting after your treatment, we encourage you to take your nausea medication as directed.  BELOW ARE SYMPTOMS THAT SHOULD BE REPORTED IMMEDIATELY: *FEVER GREATER THAN 100.4 F (38 C) OR HIGHER *CHILLS OR SWEATING *NAUSEA AND VOMITING THAT IS NOT CONTROLLED WITH YOUR NAUSEA MEDICATION *UNUSUAL SHORTNESS OF BREATH *UNUSUAL BRUISING OR BLEEDING *URINARY PROBLEMS (pain or burning when urinating, or frequent urination) *BOWEL PROBLEMS (unusual diarrhea, constipation, pain near the anus) TENDERNESS IN MOUTH AND THROAT WITH OR WITHOUT PRESENCE OF ULCERS (sore throat, sores in mouth, or a toothache) UNUSUAL RASH, SWELLING OR PAIN  UNUSUAL VAGINAL DISCHARGE OR ITCHING   Items with * indicate a potential emergency and should be followed up as soon as possible or go to the Emergency Department if any problems should occur.  Please show the CHEMOTHERAPY ALERT CARD or IMMUNOTHERAPY ALERT CARD at check-in to the Emergency Department and triage nurse.  Should you have questions after your visit or need to cancel or reschedule your appointment, please contact [facility] CANCER [place] AT [place]  [phone] and follow the prompts.  Office hours are 8:00 a.m. to 4:30 p.m. Monday - Friday. Please note that voicemails left after 4:00 p.m. may not be returned until the following business day.  We are closed weekends and major holidays. You have access to a nurse at all times for urgent questions. Please call the main number to the clinic [phone] and follow the  prompts.  For any non-urgent questions, you may also contact your provider using MyChart. We now offer e-Visits for anyone 81 and older to request care online for non-urgent symptoms. For details visit [email or web address].   Also download the MyChart app! Go to the app store, search "MyChart", open the app, select [facility], and log in with your MyChart username and password.  Masks are optional in the cancer centers. If you would like for your care team to wear a mask while they are taking care of you, please let them know. For doctor visits, patients may have with them one support person who is at least 79 years old. At this time, visitors are not allowed in the infusion area.

## 2022-05-20 NOTE — Assessment & Plan Note (Signed)
#  Right-sided colon adenocarcinoma-with synchronous metastasis to liver/unresectable;  Currently on FOLFIRI+ Zira-Bev. S/p Evaluation at [facility] II opinion/reviewed the note from [facility]; and also discussed with Dr Dennison Nancy, who agrees with current treatment plan.  Status post 5 cycles of FOLFIRI +BEV- JUNE 29th, 2023- Improving appearance of metastatic disease lung nodules; pleural effusions; metastatic disease in the liver; abdominal soft tissue lesion/lymphadenopathy peritoneal disease [however still with extensive metastatic disease].   #Proceed with cycle #8 of FOLFIRI plus Bev.  labs today reviewed;  acceptable for treatment today. CEA trending down. Improving-see above.  # Nausea/vomitting- sec to chemo G-1; zofran prn.STABLE.    # Right upper quadrant pain-secondary to malignancy-s/p evaluation with [name] palliative care; continue oxycodone 5 mg up to 1-2 pills a day as needed.   STABLE.    # Proteinuria: UA +100 protein: JULY 2023 protein: creatinine ratio:- 0.02- Okay to proceed with BeV. STABLE.   #Weight loss: sec to malignancy; appetite improved; monitor for now- worse; s/p evaluation with [facility]-STABLE.   # Genetic counseling: 2 brother/sister; 2 biological children.s/p  genetic counseling- pathogenic variant in [place] called c.859C>T. discussed with patient re: genetic counselling for family.   # Mediport- functioning-STABLE.   # DISPOSITION:   #  FOLFIRI +Zira-Bev today;DO NOT wait for Urine protein; pump off on D-3  # follow up IN 2 WEEKS-  MD;labs- cbc/cmp CEA ;urine protein creatinine ratio;  FOLFIRI +Zira-Bev; pump off D-3;-Dr.B

## 2022-05-21 LAB — CEA: CEA: 69.3 ng/mL — ABNORMAL HIGH (ref 0.0–4.7)

## 2022-05-22 ENCOUNTER — Inpatient Hospital Stay: Payer: Medicare HMO

## 2022-05-22 DIAGNOSIS — Z5112 Encounter for antineoplastic immunotherapy: Secondary | ICD-10-CM | POA: Diagnosis not present

## 2022-05-22 DIAGNOSIS — Z452 Encounter for adjustment and management of vascular access device: Secondary | ICD-10-CM | POA: Diagnosis not present

## 2022-05-22 DIAGNOSIS — C787 Secondary malignant neoplasm of liver and intrahepatic bile duct: Secondary | ICD-10-CM | POA: Diagnosis not present

## 2022-05-22 DIAGNOSIS — Z5111 Encounter for antineoplastic chemotherapy: Secondary | ICD-10-CM | POA: Diagnosis not present

## 2022-05-22 DIAGNOSIS — C182 Malignant neoplasm of ascending colon: Secondary | ICD-10-CM

## 2022-05-22 DIAGNOSIS — Z7189 Other specified counseling: Secondary | ICD-10-CM

## 2022-05-22 DIAGNOSIS — Z79899 Other long term (current) drug therapy: Secondary | ICD-10-CM | POA: Diagnosis not present

## 2022-05-22 MED ORDER — HEPARIN SOD (PORK) LOCK FLUSH 100 UNIT/ML IV SOLN
500.0000 [IU] | Freq: Once | INTRAVENOUS | Status: AC | PRN
  Administered 2022-05-22: 500 [IU]
  Filled 2022-05-22: qty 5

## 2022-05-22 MED ORDER — SODIUM CHLORIDE 0.9% FLUSH
10.0000 mL | INTRAVENOUS | Status: DC | PRN
  Administered 2022-05-22: 10 mL
  Filled 2022-05-22: qty 10

## 2022-05-25 ENCOUNTER — Telehealth: Payer: Medicare HMO | Admitting: Hospice and Palliative Medicine

## 2022-05-26 ENCOUNTER — Inpatient Hospital Stay (HOSPITAL_BASED_OUTPATIENT_CLINIC_OR_DEPARTMENT_OTHER): Payer: Medicare HMO | Admitting: Hospice and Palliative Medicine

## 2022-05-26 DIAGNOSIS — C182 Malignant neoplasm of ascending colon: Secondary | ICD-10-CM | POA: Diagnosis not present

## 2022-05-26 DIAGNOSIS — Z515 Encounter for palliative care: Secondary | ICD-10-CM

## 2022-05-26 NOTE — Progress Notes (Signed)
Virtual Visit via Telephone Note  I connected with Oscar [name] on 05/26/22 at  1:00 PM EDT by telephone and verified that I am speaking with the correct person using two identifiers.  Location: Patient: Home Provider: Clinic   I discussed the limitations, risks, security and privacy concerns of performing an evaluation and management service by telephone and the availability of in person appointments. I also discussed with the patient that there may be a patient responsible charge related to this service. The patient expressed understanding and agreed to proceed.   History of Present Illness: Oscar [name] is a 81 y.o. male with multiple medical problems including stage IV colon cancer.  Patient was referred to palliative care to help address goals and manage ongoing symptoms.   Observations/Objective: I called and spoke with patient by phone.    Patient reports that he is doing well.  He denies significant changes or concerns.  No symptomatic complaints at present.  He endorses occasional pain but takes oxycodone for that and feels like pain is reasonably well managed.  Assessment and Plan: Stage IV colon cancer -on treatment with FOLFIRI plus Bev.  Patient has follow-up with Dr. Rogue Bussing next week  Neoplasm related pain -continue oxycodone.   Follow Up Instructions: Follow-up telephone visit 2 months   I discussed the assessment and treatment plan with the patient. The patient was provided an opportunity to ask questions and all were answered. The patient agreed with the plan and demonstrated an understanding of the instructions.   The patient was advised to call back or seek an in-person evaluation if the symptoms worsen or if the condition fails to improve as anticipated.  I provided 5 minutes of non-face-to-face time during this encounter.   [name], NP

## 2022-06-02 MED FILL — Dexamethasone Sodium Phosphate Inj 100 MG/10ML: INTRAMUSCULAR | Qty: 1 | Status: AC

## 2022-06-03 ENCOUNTER — Inpatient Hospital Stay: Payer: Medicare HMO

## 2022-06-03 ENCOUNTER — Inpatient Hospital Stay (HOSPITAL_BASED_OUTPATIENT_CLINIC_OR_DEPARTMENT_OTHER): Payer: Medicare HMO | Admitting: Internal Medicine

## 2022-06-03 ENCOUNTER — Encounter: Payer: Self-pay | Admitting: Internal Medicine

## 2022-06-03 VITALS — BP 115/76 | HR 70 | Temp 96.7°F | Ht 72.0 in | Wt 152.4 lb

## 2022-06-03 DIAGNOSIS — C182 Malignant neoplasm of ascending colon: Secondary | ICD-10-CM

## 2022-06-03 DIAGNOSIS — Z5112 Encounter for antineoplastic immunotherapy: Secondary | ICD-10-CM | POA: Diagnosis not present

## 2022-06-03 DIAGNOSIS — C787 Secondary malignant neoplasm of liver and intrahepatic bile duct: Secondary | ICD-10-CM | POA: Diagnosis not present

## 2022-06-03 DIAGNOSIS — Z5111 Encounter for antineoplastic chemotherapy: Secondary | ICD-10-CM | POA: Diagnosis not present

## 2022-06-03 DIAGNOSIS — Z79899 Other long term (current) drug therapy: Secondary | ICD-10-CM | POA: Diagnosis not present

## 2022-06-03 DIAGNOSIS — Z7189 Other specified counseling: Secondary | ICD-10-CM

## 2022-06-03 DIAGNOSIS — Z452 Encounter for adjustment and management of vascular access device: Secondary | ICD-10-CM | POA: Diagnosis not present

## 2022-06-03 LAB — CBC WITH DIFFERENTIAL/PLATELET
Abs Immature Granulocytes: 0.02 K/uL (ref 0.00–0.07)
Basophils Absolute: 0 K/uL (ref 0.0–0.1)
Basophils Relative: 1 %
Eosinophils Absolute: 0.2 K/uL (ref 0.0–0.5)
Eosinophils Relative: 5 %
HCT: 32.6 % — ABNORMAL LOW (ref 39.0–52.0)
Hemoglobin: 10.6 g/dL — ABNORMAL LOW (ref 13.0–17.0)
Immature Granulocytes: 1 %
Lymphocytes Relative: 17 %
Lymphs Abs: 0.8 K/uL (ref 0.7–4.0)
MCH: 32.6 pg (ref 26.0–34.0)
MCHC: 32.5 g/dL (ref 30.0–36.0)
MCV: 100.3 fL — ABNORMAL HIGH (ref 80.0–100.0)
Monocytes Absolute: 0.5 K/uL (ref 0.1–1.0)
Monocytes Relative: 11 %
Neutro Abs: 2.9 K/uL (ref 1.7–7.7)
Neutrophils Relative %: 65 %
Platelets: 172 K/uL (ref 150–400)
RBC: 3.25 MIL/uL — ABNORMAL LOW (ref 4.22–5.81)
RDW: 16.3 % — ABNORMAL HIGH (ref 11.5–15.5)
WBC: 4.4 K/uL (ref 4.0–10.5)
nRBC: 0 % (ref 0.0–0.2)

## 2022-06-03 LAB — COMPREHENSIVE METABOLIC PANEL
ALT: 19 U/L (ref 0–44)
AST: 35 U/L (ref 15–41)
Albumin: 2.7 g/dL — ABNORMAL LOW (ref 3.5–5.0)
Alkaline Phosphatase: 156 U/L — ABNORMAL HIGH (ref 38–126)
Anion gap: 5 (ref 5–15)
BUN: 14 mg/dL (ref 8–23)
CO2: 25 mmol/L (ref 22–32)
Calcium: 8 mg/dL — ABNORMAL LOW (ref 8.9–10.3)
Chloride: 109 mmol/L (ref 98–111)
Creatinine, Ser: 0.89 mg/dL (ref 0.61–1.24)
GFR, Estimated: >60 mL/min (ref >60)
Glucose, Bld: 177 mg/dL — ABNORMAL HIGH (ref 70–99)
Potassium: 4.2 mmol/L (ref 3.5–5.1)
Sodium: 139 mmol/L (ref 135–145)
Total Bilirubin: 0.8 mg/dL (ref 0.3–1.2)
Total Protein: 6.8 g/dL (ref 6.5–8.1)

## 2022-06-03 LAB — PROTEIN / CREATININE RATIO, URINE
Creatinine, Urine: 350 mg/dL
Protein Creatinine Ratio: 0.05 mg/mg{Cre} (ref 0.00–0.15)
Total Protein, Urine: 16 mg/dL

## 2022-06-03 MED ORDER — PALONOSETRON HCL INJECTION 0.25 MG/5ML
0.2500 mg | Freq: Once | INTRAVENOUS | Status: AC
  Administered 2022-06-03: 0.25 mg via INTRAVENOUS

## 2022-06-03 MED ORDER — FLUOROURACIL CHEMO INJECTION 2.5 GM/50ML
400.0000 mg/m2 | Freq: Once | INTRAVENOUS | Status: AC
  Administered 2022-06-03: 750 mg via INTRAVENOUS
  Filled 2022-06-03: qty 15

## 2022-06-03 MED ORDER — HEPARIN SOD (PORK) LOCK FLUSH 100 UNIT/ML IV SOLN
500.0000 [IU] | Freq: Once | INTRAVENOUS | Status: DC | PRN
  Filled 2022-06-03: qty 5

## 2022-06-03 MED ORDER — SODIUM CHLORIDE 0.9 % IV SOLN
180.0000 mg/m2 | Freq: Once | INTRAVENOUS | Status: AC
  Administered 2022-06-03: 340 mg via INTRAVENOUS
  Filled 2022-06-03: qty 15

## 2022-06-03 MED ORDER — OXYCODONE HCL 5 MG PO TABS: 5.0000 mg | ORAL_TABLET | Freq: Three times a day (TID) | ORAL | 0 refills | Status: DC | PRN

## 2022-06-03 MED ORDER — SODIUM CHLORIDE 0.9 % IV SOLN
5.0000 mg/kg | Freq: Once | INTRAVENOUS | Status: AC
  Administered 2022-06-03: 350 mg via INTRAVENOUS
  Filled 2022-06-03: qty 14

## 2022-06-03 MED ORDER — SODIUM CHLORIDE 0.9 % IV SOLN
10.0000 mg | Freq: Once | INTRAVENOUS | Status: AC
  Administered 2022-06-03: 10 mg via INTRAVENOUS
  Filled 2022-06-03: qty 10

## 2022-06-03 MED ORDER — SODIUM CHLORIDE 0.9 % IV SOLN
Freq: Once | INTRAVENOUS | Status: AC
  Filled 2022-06-03: qty 250

## 2022-06-03 MED ORDER — ATROPINE SULFATE 1 MG/ML IV SOLN
0.4000 mg | Freq: Once | INTRAVENOUS | Status: AC
  Administered 2022-06-03: 0.4 mg via INTRAVENOUS

## 2022-06-03 MED ORDER — ATROPINE SULFATE 1 MG/ML IV SOLN
INTRAVENOUS | Status: AC
  Filled 2022-06-03: qty 1

## 2022-06-03 MED ORDER — PALONOSETRON HCL INJECTION 0.25 MG/5ML
INTRAVENOUS | Status: AC
  Filled 2022-06-03: qty 5

## 2022-06-03 MED ORDER — SODIUM CHLORIDE 0.9 % IV SOLN
2400.0000 mg/m2 | INTRAVENOUS | Status: DC
  Administered 2022-06-03: 4500 mg via INTRAVENOUS
  Filled 2022-06-03: qty 90

## 2022-06-03 NOTE — Patient Instructions (Signed)
[facility] AT [place]  Discharge Instructions: Thank you for choosing [facility] to provide your oncology and hematology care.  If you have a lab appointment with the [facility], please go directly to the [facility] and check in at the registration area.  Wear comfortable clothing and clothing appropriate for easy access to any Portacath or PICC line.   We strive to give you quality time with your provider. You may need to reschedule your appointment if you arrive late (15 or more minutes).  Arriving late affects you and other patients whose appointments are after yours.  Also, if you miss three or more appointments without notifying the office, you may be dismissed from the clinic at the provider's discretion.      For prescription refill requests, have your pharmacy contact our office and allow 72 hours for refills to be completed.    Today you received the following chemotherapy and/or immunotherapy agents Zirabev, Irinotecan, Adrucil      To help prevent nausea and vomiting after your treatment, we encourage you to take your nausea medication as directed.  BELOW ARE SYMPTOMS THAT SHOULD BE REPORTED IMMEDIATELY: *FEVER GREATER THAN 100.4 F (38 C) OR HIGHER *CHILLS OR SWEATING *NAUSEA AND VOMITING THAT IS NOT CONTROLLED WITH YOUR NAUSEA MEDICATION *UNUSUAL SHORTNESS OF BREATH *UNUSUAL BRUISING OR BLEEDING *URINARY PROBLEMS (pain or burning when urinating, or frequent urination) *BOWEL PROBLEMS (unusual diarrhea, constipation, pain near the anus) TENDERNESS IN MOUTH AND THROAT WITH OR WITHOUT PRESENCE OF ULCERS (sore throat, sores in mouth, or a toothache) UNUSUAL RASH, SWELLING OR PAIN  UNUSUAL VAGINAL DISCHARGE OR ITCHING   Items with * indicate a potential emergency and should be followed up as soon as possible or go to the Emergency Department if any problems should occur.  Please show the CHEMOTHERAPY ALERT CARD or IMMUNOTHERAPY ALERT  CARD at check-in to the Emergency Department and triage nurse.  Should you have questions after your visit or need to cancel or reschedule your appointment, please contact [facility] [place] [facility] [place]  [phone] and follow the prompts.  Office hours are 8:00 a.m. to 4:30 p.m. Monday - Friday. Please note that voicemails left after 4:00 p.m. may not be returned until the following business day.  We are closed weekends and major holidays. You have access to a nurse at all times for urgent questions. Please call the main number to the clinic [phone] and follow the prompts.  For any non-urgent questions, you may also contact your provider using MyChart. We now offer e-Visits for anyone 39 and older to request care online for non-urgent symptoms. For details visit [email or web address].   Also download the MyChart app! Go to the app store, search "MyChart", open the app, select [facility], and log in with your MyChart username and password.  Masks are optional in the cancer centers. If you would like for your care team to wear a mask while they are taking care of you, please let them know. For doctor visits, patients may have with them one support person who is at least 79 years old. At this time, visitors are not allowed in the infusion area.

## 2022-06-03 NOTE — Progress Notes (Signed)
ON PATHWAY REGIMEN - Colorectal  No Change  Continue With Treatment as Ordered.  Original Decision Date/Time: 06/25/2020 17:42     A cycle is every 14 days:     Bevacizumab-xxxx      Irinotecan      Leucovorin      Fluorouracil      Fluorouracil   **Always confirm dose/schedule in your pharmacy ordering system**  Patient Characteristics: Distant Metastases, Nonsurgical Candidate, KRAS/NRAS Mutation Positive/Unknown (BRAF V600 Wild-Type/Unknown), Standard Cytotoxic Therapy, Second Line Standard Cytotoxic Therapy, Bevacizumab Eligible Tumor Location: Colon Therapeutic Status: Distant Metastases Microsatellite/Mismatch Repair Status: MSS/pMMR BRAF Mutation Status: Wild-Type (no mutation) KRAS/NRAS Mutation Status: Mutation Positive Standard Cytotoxic Line of Therapy: Second Line Standard Cytotoxic Therapy Bevacizumab Eligibility: Eligible Intent of Therapy: Non-Curative / Palliative Intent, Discussed with Patient

## 2022-06-03 NOTE — Assessment & Plan Note (Addendum)
#  Right-sided colon adenocarcinoma-with synchronous metastasis to liver/unresectable;  Currently on FOLFIRI+ Zira-Bev. S/p Evaluation at [facility] opinion/reviewed the note from [facility]; and also discussed with Dr Dennison Nancy, who agrees with current treatment plan.  Status post 5 cycles of FOLFIRI +BEV- JUNE 29th, 2023- Improving appearance of metastatic disease lung nodules; pleural effusions; metastatic disease in the liver; abdominal soft tissue lesion/lymphadenopathy peritoneal disease [however still with extensive metastatic disease].   #Proceed with cycle #9 of FOLFIRI plus Bev.  labs today reviewed;  acceptable for treatment today. CEA trending down. Improving-see above. Will repeat scans in end of sep-oct, 2023  # Nausea/vomitting- sec to chemo G-1; zofran prn.STABLE.    # Right upper quadrant pain-secondary to malignancy-s/p evaluation with [name]; continue oxycodone 5 mg up to 1-2 pills a day as needed.   STABLE.   # Proteinuria: UA +100 protein: JULY 2023 protein: creatinine ratio:- 0.02- Okay to proceed with BeV. STABLE.   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 discussed with patient re: genetic counselling for family.   # Mediport- functioning-STABLE.   Mon/Tues # DISPOSITION:   #  FOLFIRI +Zira-Bev today;DO NOT wait for Urine protein; pump off on D-3  # follow up on sep 5th/Tuesday-  MD;labs- cbc/cmp CEA ;urine protein creatinine ratio;  FOLFIRI +Zira-Bev; pump off D-3;-Dr.B

## 2022-06-03 NOTE — Progress Notes (Signed)
[facility] NOTE  Patient Care Team: Sofie Hartigan, MD as PCP - General (Family Medicine) Cammie Sickle, MD as Consulting Physician (Hematology and Oncology)  CHIEF COMPLAINTS/PURPOSE OF CONSULTATION: Colon cancer  #  Oncology History Overview Note  # MAY 2020- 3. 03/09/19 Liver biopsy. Microscopic examination shows malignant cells with glandular architecture consistent with adenocarcinoma. The malignant cells are positive for CK20 and CDX-2. These findings support the clinical impression of metastatic colon adenocarcinoma. 4. 03/10/19 R hemicolectomy. Tumor site cecum. Adenocarcinoma. Mucinous features present. G2. No tumor deposits. Invades visceral peritoneum. No tumor perforation. LVI present. PNI not identified. All margins uninvolved. 1/12 LNs. PT4apN1. Periappendiceal inflammation c/w resolving abscess. Microsatellite stable (MSS). [Dr.Mettu; [facility]]  # SEP 4th 2020 [compared to May 2020]  Interval increase in size of the metastases to the hepatic dome, The metastasis to the left hepatic lobe is unchanged; 2.  New subcentimeter hypoattenuating lesion in the inferior right hepatic lobe, incompletely characterized on CT. 3.  Postsurgical changes following right hemicolectomy.  # OCT 2020- FOLFOX +avastin; CT dec 22nd 2020- [compared to [facility] sep 9th 2020]-Liver- slight progression versus stable disease; CT scan SEP 4th 2021- Progressive disease-left lower lobe lung nodule 8 mm [previously 4 mm]; increase in size of the hepatic metastases by few millimeters.;  Soft tissue nodule adjacent to anastomotic site again increased by few millimeters.STOP FOLFOX; cont avastin  #  SEP 20th, 2021- FOLFIRI+ AVASTIN; HOLD FEB 2022- sec to poor tolerance [peptic ulcer]; 3/30-liver ablation-   [bland embolization of segment 2/3 liver lesion on 12/19/2020;  microwave ablation of both liver lesions.  # ?July 25th, 2022- BEV _ TAS 102.MAY 2022- s/p Liver ablation  # DEC  2022-progressive disease-lung and liver/abdomen; JAN 31st 2023-START Regorefenib.   # FEB /16-EGD [Dr.Anna-duodenal ulcer-? meloxicam]  # JAN 2022- COVID [s/p Mab infusion; pills; skin rash resolved.]  # NGS/F-ONE-MUTATED K-RAS [G]       Cancer of right colon ([place])  07/05/2019 Initial Diagnosis   Cancer of right colon ([place])   07/24/2019 - 06/25/2020 Chemotherapy   The patient had dexamethasone (DECADRON) 4 MG tablet, 8 mg, Oral, Daily, 1 of 1 cycle, Start date: --, End date: -- palonosetron (ALOXI) injection 0.25 mg, 0.25 mg, Intravenous,  Once, 23 of 25 cycles Administration: 0.25 mg (07/24/2019), 0.25 mg (08/07/2019), 0.25 mg (08/21/2019), 0.25 mg (09/04/2019), 0.25 mg (09/20/2019), 0.25 mg (10/04/2019), 0.25 mg (10/16/2019), 0.25 mg (10/30/2019), 0.25 mg (11/22/2019), 0.25 mg (12/06/2019), 0.25 mg (12/20/2019), 0.25 mg (01/03/2020), 0.25 mg (01/29/2020), 0.25 mg (02/12/2020), 0.25 mg (02/26/2020), 0.25 mg (03/11/2020), 0.25 mg (03/25/2020), 0.25 mg (04/08/2020), 0.25 mg (04/24/2020), 0.25 mg (05/08/2020), 0.25 mg (05/22/2020), 0.25 mg (06/10/2020), 0.25 mg (06/25/2020) leucovorin 800 mg in dextrose 5 % 250 mL infusion, 844 mg, Intravenous,  Once, 23 of 25 cycles Administration: 800 mg (07/24/2019), 800 mg (08/07/2019), 800 mg (08/21/2019), 800 mg (09/04/2019), 800 mg (09/20/2019), 800 mg (10/04/2019), 800 mg (10/16/2019), 800 mg (10/30/2019), 800 mg (11/22/2019), 800 mg (12/06/2019), 800 mg (12/20/2019), 800 mg (01/03/2020), 800 mg (01/29/2020), 800 mg (02/12/2020), 800 mg (02/26/2020), 800 mg (03/11/2020), 800 mg (03/25/2020), 800 mg (04/08/2020), 800 mg (04/24/2020), 800 mg (05/08/2020), 800 mg (05/22/2020), 800 mg (06/10/2020), 800 mg (06/25/2020) oxaliplatin (ELOXATIN) 180 mg in dextrose 5 % 500 mL chemo infusion, 85 mg/m2 = 180 mg, Intravenous,  Once, 23 of 25 cycles Dose modification: 178 mg (original dose 85 mg/m2, Cycle 17, Reason: Other (see comments), Comment: insurance adjusted dose ) Administration: 180 mg (07/24/2019), 180  mg  (08/07/2019), 180 mg (08/21/2019), 180 mg (09/04/2019), 180 mg (09/20/2019), 180 mg (10/04/2019), 180 mg (10/16/2019), 180 mg (10/30/2019), 180 mg (11/22/2019), 180 mg (12/06/2019), 180 mg (12/20/2019), 180 mg (01/03/2020), 180 mg (01/29/2020), 180 mg (02/12/2020), 180 mg (02/26/2020), 180 mg (03/11/2020), 180 mg (04/08/2020), 180 mg (04/24/2020), 180 mg (05/08/2020), 180 mg (05/22/2020), 180 mg (06/10/2020), 180 mg (06/25/2020) fluorouracil (ADRUCIL) 5,000 mg in sodium chloride 0.9 % 150 mL chemo infusion, 5,050 mg, Intravenous, 1 Day/Dose, 23 of 25 cycles Administration: 5,000 mg (07/24/2019), 5,000 mg (08/07/2019), 5,000 mg (08/21/2019), 5,050 mg (09/04/2019), 5,000 mg (10/04/2019), 5,000 mg (10/16/2019), 5,000 mg (11/22/2019), 5,000 mg (12/06/2019), 5,000 mg (12/20/2019), 5,000 mg (01/03/2020), 5,000 mg (01/29/2020), 5,000 mg (02/12/2020), 5,000 mg (02/26/2020), 5,000 mg (03/11/2020), 5,000 mg (03/25/2020), 5,000 mg (04/08/2020), 5,000 mg (04/24/2020), 5,000 mg (05/08/2020), 5,000 mg (05/22/2020), 5,000 mg (06/10/2020), 5,000 mg (06/25/2020) bevacizumab-bvzr (ZIRABEV) 400 mg in sodium chloride 0.9 % 100 mL chemo infusion, 5 mg/kg = 400 mg, Intravenous,  Once, 23 of 25 cycles Administration: 400 mg (08/07/2019), 400 mg (08/21/2019), 400 mg (09/04/2019), 400 mg (09/20/2019), 400 mg (10/04/2019), 400 mg (10/16/2019), 400 mg (10/30/2019), 400 mg (11/22/2019), 400 mg (12/06/2019), 400 mg (12/20/2019), 400 mg (01/03/2020), 400 mg (01/29/2020), 400 mg (02/12/2020), 400 mg (02/26/2020), 400 mg (03/11/2020), 400 mg (03/25/2020), 400 mg (04/08/2020), 400 mg (04/24/2020), 400 mg (05/08/2020), 400 mg (05/22/2020), 400 mg (06/10/2020), 400 mg (06/25/2020)  for chemotherapy treatment.    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 NTHL1 is associated with autosomal recessive NTHL1-associated polyposis, meaning Mr. Bibbee is a carrier of this condition and does not have it himself. VUS in RAD50 also identified on the [facility] Multi-Cancer+RNA panel. The report date is 05/11/2022.  The Multi-Cancer Panel + RNA offered by [facility] includes sequencing and/or deletion duplication testing of the following 84 genes: AIP, ALK, APC, ATM, AXIN2,BAP1,  BARD1, BLM, BMPR1A, BRCA1, BRCA2, BRIP1, CASR, CDC73, CDH1, CDK4, CDKN1B, CDKN1C, CDKN2A (p14ARF), CDKN2A (p16INK4a), CEBPA, CHEK2, CTNNA1, DICER1, DIS3L2, EGFR (c.2369C>T, p.Thr790Met variant only), EPCAM (Deletion/duplication testing only), FH, FLCN, GATA2, GPC3, GREM1 (Promoter region deletion/duplication testing only), HOXB13 (c.251G>A, p.Gly84Glu), HRAS, KIT, MAX, MEN1, MET, MITF (c.952G>A, p.Glu318Lys variant only), MLH1, MSH2, MSH3, MSH6, MUTYH, NBN, NF1, NF2, NTHL1, PALB2, PDGFRA, PHOX2B, PMS2, POLD1, POLE, POT1, PRKAR1A, PTCH1, PTEN, RAD50, RAD51C, RAD51D, RB1, RECQL4, RET, RUNX1, SDHAF2, SDHA (sequence changes only), SDHB, SDHC, SDHD, SMAD4, SMARCA4, SMARCB1, SMARCE1, STK11, SUFU, TERC, TERT, TMEM127, TP53, TSC1, TSC2, VHL, WRN and WT1.    HISTORY OF PRESENTING ILLNESS: He is alone.  Ambulating independently.  Oscar [name] 81 y.o.  male with metastatic colon cancer to the liver-s/p multiple lines of therapy -currently on FOLFIRI + Bev is here follow-up/review results of the follow-up.  Denies any worsening abdominal pain-however continues to take oxycodone 5 mg 1 to 2 pills a day.  No stools normal.  No diarrhea.  No weight loss.  Mild nausea with vomiting= improved with anti-emetics. Patient is stable. States his appetite is good.  Review of Systems  Constitutional:  Positive for malaise/fatigue. Negative  for  chills, diaphoresis and fever.  HENT:  Negative for nosebleeds and sore throat.   Eyes:  Negative for double vision.  Respiratory:  Negative for cough, hemoptysis, sputum production, shortness of breath and wheezing.   Cardiovascular:  Negative for chest pain, palpitations, orthopnea and leg swelling.  Gastrointestinal:  Negative for blood in stool, constipation, heartburn, melena, nausea and vomiting.  Genitourinary:  Negative for dysuria, frequency and urgency.  Musculoskeletal:  Positive for back pain and joint pain.  Skin: Negative.  Negative for itching and rash.  Neurological:  Positive for tingling. Negative for focal weakness, weakness and headaches.  Endo/Heme/Allergies:  Does not bruise/bleed easily.  Psychiatric/Behavioral:  Negative for depression. The patient is not nervous/anxious and does not have insomnia.      MEDICAL HISTORY:  Past Medical History:  Diagnosis Date   Arthritis    Cancer ([place])    colon cancer 02/2019 per pt    Family history of adverse reaction to anesthesia    cousin took all night to wake up from anesthesia   H/O colon cancer, stage IV    Hyperlipemia    Neuromuscular disorder ([place])    Pre-diabetes     SURGICAL HISTORY: Past Surgical History:  Procedure Laterality Date   COLON SURGERY     ESOPHAGOGASTRODUODENOSCOPY (EGD) WITH PROPOFOL N/A 12/26/2020   Procedure: ESOPHAGOGASTRODUODENOSCOPY (EGD) WITH PROPOFOL;  Surgeon: [name], MD;  Location[facility] ENDOSCOPY;  Service: Gastroenterology;  Laterality: N/A;   IR ANGIOGRAM SELECTIVE EACH ADDITIONAL VESSEL  12/19/2020   IR ANGIOGRAM SELECTIVE EACH ADDITIONAL VESSEL  03/24/2021   IR ANGIOGRAM SELECTIVE EACH ADDITIONAL VESSEL  03/24/2021   IR ANGIOGRAM SELECTIVE EACH ADDITIONAL VESSEL  04/04/2021   IR ANGIOGRAM SELECTIVE EACH ADDITIONAL VESSEL  04/04/2021   IR ANGIOGRAM SELECTIVE EACH ADDITIONAL VESSEL  04/04/2021   IR ANGIOGRAM VISCERAL SELECTIVE  12/19/2020   IR ANGIOGRAM VISCERAL SELECTIVE  03/24/2021   IR  ANGIOGRAM VISCERAL SELECTIVE  04/04/2021   IR ANGIOGRAM VISCERAL SELECTIVE  04/04/2021   IR EMBO ARTERIAL NOT HEMORR HEMANG INC GUIDE ROADMAPPING  03/24/2021   IR EMBO TUMOR ORGAN ISCHEMIA INFARCT INC GUIDE ROADMAPPING  12/19/2020   IR EMBO TUMOR ORGAN ISCHEMIA INFARCT INC GUIDE ROADMAPPING  04/04/2021   IR IMAGING GUIDED PORT INSERTION  07/20/2019   IR RADIOLOGIST EVAL & MGMT  12/05/2020   IR RADIOLOGIST EVAL & MGMT  02/11/2021   IR RADIOLOGIST EVAL & MGMT  03/04/2021   IR RADIOLOGIST EVAL & MGMT  04/29/2021   IR RADIOLOGIST EVAL & MGMT  07/22/2021   IR RADIOLOGIST EVAL & MGMT  11/19/2021   IR US GUIDE VASC ACCESS RIGHT  12/19/2020   IR US GUIDE VASC ACCESS RIGHT  03/24/2021   IR US GUIDE VASC ACCESS RIGHT  04/04/2021   JOINT REPLACEMENT Left 2010   Knee   RADIOLOGY WITH ANESTHESIA N/A 01/15/2021   Procedure: CT WITH ANESTHESIA MICROWAVE ABLATION OF LIVER;  Surgeon: Criselda Peaches, MD;  Location: [facility] ORS;  Service: Anesthesiology;  Laterality: N/A;    SOCIAL HISTORY: Social History   Socioeconomic History   Marital status: Married    Spouse name: Not on file   Number of children: Not on file   Years of education: Not on file   Highest education level: Not on file  Occupational History   Not on file  Tobacco Use   Smoking status: Every Day    Packs/day: 0.25    Years: 15.00    Total pack years: 3.75  Types: Cigarettes   Smokeless tobacco: Never   Tobacco comments:    1 to 2 cigarettes a day occasionally  Vaping Use   Vaping Use: Never used  Substance and Sexual Activity   Alcohol use: Yes    Comment: rarely    Drug use: No   Sexual activity: Not on file  Other Topics Concern   Not on file  Social History Narrative   Recruitment consultant retd; lives in [place]; smoking 3cig/day; [3/4 ppd x started at 9 years]; no alcohol. Son & daughter; wife dementia [waiting for placement].    Social Determinants of Health   Financial Resource Strain: Not on file  Food Insecurity:  Not on file  Transportation Needs: Not on file  Physical Activity: Not on file  Stress: Not on file  Social Connections: Not on file  Intimate Partner Violence: Not on file    FAMILY HISTORY: Family History  Problem Relation Age of Onset   Peptic Ulcer Disease Father     ALLERGIES:  is allergic to ace inhibitors.  MEDICATIONS:  Current Outpatient Medications  Medication Sig Dispense Refill   amLODipine (NORVASC) 5 MG tablet Take 5 mg by mouth daily. (Patient not taking: Reported on 10/03/2021)     diphenoxylate-atropine (LOMOTIL) 2.5-0.025 MG tablet Take 1 tablet by mouth 4 (four) times daily as needed for diarrhea or loose stools. Take it along with immodium (Patient not taking: Reported on 02/23/2022) 60 tablet 0   ondansetron (ZOFRAN) 8 MG tablet Take 1 tablet (8 mg total) by mouth every 8 (eight) hours as needed for nausea or vomiting. (Patient not taking: Reported on 03/09/2022) 30 tablet 0   oxyCODONE (OXY IR/ROXICODONE) 5 MG immediate release tablet Take 1 tablet (5 mg total) by mouth every 8 (eight) hours as needed for severe pain. 60 tablet 0   polyethylene glycol powder (GLYCOLAX/MIRALAX) 17 GM/SCOOP powder Take by mouth as needed. (Patient not taking: Reported on 12/29/2021)     pravastatin (PRAVACHOL) 40 MG tablet Take 40 mg by mouth daily.  (Patient not taking: Reported on 06/09/2021)     predniSONE (DELTASONE) 20 MG tablet Take 1 tablet (20 mg total) by mouth daily with breakfast. (Patient not taking: Reported on 02/06/2022) 30 tablet 1   prochlorperazine (COMPAZINE) 10 MG tablet Take 1 tablet (10 mg total) by mouth every 6 (six) hours as needed for nausea or vomiting. (Patient not taking: Reported on 03/09/2022) 60 tablet 0   regorafenib (STIVARGA) 40 MG tablet Take 2 tablets (80 mg total) by mouth daily with breakfast. Take for 21 days, then hold for 7 days. Repeat every 28 days. (Patient not taking: Reported on 02/06/2022) 42 tablet 1   senna (SENOKOT) 8.6 MG TABS tablet Take 1  tablet (8.6 mg total) by mouth daily. (Patient not taking: Reported on 12/29/2021) 120 tablet 0   sucralfate (CARAFATE) 1 g tablet Take 1 tablet (1 g total) by mouth 4 (four) times daily -  with meals and at bedtime. (Patient not taking: Reported on 11/18/2021) 90 tablet 3   tamsulosin (FLOMAX) 0.4 MG CAPS capsule Take 1 capsule (0.4 mg total) by mouth daily. (Patient not taking: Reported on 04/06/2022) 90 capsule 1   No current facility-administered medications for this visit.      Marland Kitchen  PHYSICAL EXAMINATION: ECOG PERFORMANCE STATUS: 0 - Asymptomatic  Vitals:   06/03/22 0823  BP: 115/76  Pulse: 70  Temp: (!) 96.7 F (35.9 C)  SpO2: 100%    Filed Weights   06/03/22  0881  Weight: 152 lb 6.4 oz (69.1 kg)     Physical Exam HENT:     Head: Normocephalic and atraumatic.     Mouth/Throat:     Pharynx: No oropharyngeal exudate.  Eyes:     Pupils: Pupils are equal, round, and reactive to light.  Cardiovascular:     Rate and Rhythm: Normal rate and regular rhythm.  Pulmonary:     Effort: No respiratory distress.     Breath sounds: No wheezing.     Comments: Decreased breath sounds bilaterally.  No wheeze or crackles Abdominal:     General: Bowel sounds are normal. There is no distension.     Palpations: Abdomen is soft. There is no mass.     Tenderness: There is no abdominal tenderness. There is no guarding or rebound.  Musculoskeletal:        General: No tenderness. Normal range of motion.     Cervical back: Normal range of motion and neck supple.  Skin:    General: Skin is warm.  Neurological:     Mental Status: He is alert and oriented to person, place, and time.  Psychiatric:        Mood and Affect: Affect normal.    LABORATORY DATA:  I have reviewed the data as listed Lab Results  Component Value Date   WBC 4.4 06/03/2022   HGB 10.6 (L) 06/03/2022   HCT 32.6 (L) 06/03/2022   MCV 100.3 (H) 06/03/2022   PLT 172 06/03/2022   Recent Labs    05/04/22 0826  05/20/22 0819 06/03/22 0822  NA 139 140 139  K 3.9 3.8 4.2  CL 110 110 109  CO2 $Re'26 26 25  'VZe$ GLUCOSE 139* 158* 177*  BUN $Re'12 13 14  'wDa$ CREATININE 0.85 0.75 0.89  CALCIUM 8.1* 8.5* 8.0*  GFRNONAA >60 >60 >60  PROT 6.7 7.0 6.8  ALBUMIN 2.7* 2.9* 2.7*  AST 32 35 35  ALT $Re'18 18 19  'RqU$ ALKPHOS 152* 171* 156*  BILITOT 1.1 0.8 0.8    RADIOGRAPHIC STUDIES: I have personally reviewed the radiological images as listed and agreed with the findings in the report. No results found.   Latest Reference Range & Units Most Recent 06/09/21 08:30 06/25/21 10:24 07/07/21 09:06 07/21/21 08:32 08/04/21 08:54 08/18/21 08:31 09/01/21 08:47 09/25/21 08:29 10/28/21 10:14 11/18/21 14:20 12/08/21 09:06  CEA 0.0 - 4.7 ng/mL 301.0 (H) 12/08/21 09:06 91.6 (H) 84.1 (H) 87.6 (H) 89.1 (H) 108.0 (H) 108.0 (H) 107.0 (H) 105.0 (H) 119.0 (H) 177.0 (H) 301.0 (H)  (H): Data is abnormally high  ASSESSMENT & PLAN:   Cancer of right colon ([facility]) #Right-sided colon adenocarcinoma-with synchronous metastasis to liver/unresectable;  Currently on FOLFIRI+ Zira-Bev. S/p Evaluation at [facility] opinion/reviewed the [facility] from [facility]; and also discussed with Dr Dennison Nancy, who agrees with current treatment plan.  Status post 5 cycles of FOLFIRI +BEV- JUNE 29th, 2023- Improving appearance of metastatic disease lung nodules; pleural effusions; metastatic disease in the liver; abdominal soft tissue lesion/lymphadenopathy peritoneal disease [however still with extensive metastatic disease].   #Proceed with cycle #9 of FOLFIRI plus Bev.  labs today reviewed;  acceptable for treatment today. CEA trending down. Improving-see above. Will repeat scans in end of sep-oct, 2023  # Nausea/vomitting- sec to chemo G-1; zofran prn.STABLE.    # Right upper quadrant pain-secondary to malignancy-s/p evaluation with [name]; continue oxycodone 5 mg up to 1-2 pills a day as needed.   STABLE.   # Proteinuria: UA +100 protein:  JULY 2023 protein: creatinine  ratio:- 0.02- Okay to proceed with BeV. STABLE.   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 discussed with patient re: genetic counselling for family.   # Mediport- functioning-STABLE.   Mon/Tues # DISPOSITION:   #  FOLFIRI +Zira-Bev today;DO NOT wait for Urine protein; pump off on D-3  # follow up on sep 5th/Tuesday-  MD;labs- cbc/cmp CEA ;urine protein creatinine ratio;  FOLFIRI +Zira-Bev; pump off D-3;-Dr.B             All questions were answered. The patient knows to call the clinic with any problems, questions or concerns.    Cammie Sickle, MD 06/03/2022 9:13 AM

## 2022-06-04 LAB — CEA: CEA: 61.5 ng/mL — ABNORMAL HIGH (ref 0.0–4.7)

## 2022-06-05 ENCOUNTER — Inpatient Hospital Stay: Payer: Medicare HMO

## 2022-06-05 VITALS — BP 134/70 | HR 64 | Temp 97.9°F | Resp 16

## 2022-06-05 DIAGNOSIS — Z5111 Encounter for antineoplastic chemotherapy: Secondary | ICD-10-CM | POA: Diagnosis not present

## 2022-06-05 DIAGNOSIS — C182 Malignant neoplasm of ascending colon: Secondary | ICD-10-CM

## 2022-06-05 DIAGNOSIS — Z7189 Other specified counseling: Secondary | ICD-10-CM

## 2022-06-05 DIAGNOSIS — Z452 Encounter for adjustment and management of vascular access device: Secondary | ICD-10-CM | POA: Diagnosis not present

## 2022-06-05 DIAGNOSIS — Z5112 Encounter for antineoplastic immunotherapy: Secondary | ICD-10-CM | POA: Diagnosis not present

## 2022-06-05 DIAGNOSIS — C787 Secondary malignant neoplasm of liver and intrahepatic bile duct: Secondary | ICD-10-CM | POA: Diagnosis not present

## 2022-06-05 DIAGNOSIS — Z79899 Other long term (current) drug therapy: Secondary | ICD-10-CM | POA: Diagnosis not present

## 2022-06-05 MED ORDER — HEPARIN SOD (PORK) LOCK FLUSH 100 UNIT/ML IV SOLN
500.0000 [IU] | Freq: Once | INTRAVENOUS | Status: AC | PRN
  Administered 2022-06-05: 500 [IU]
  Filled 2022-06-05: qty 5

## 2022-06-05 MED ORDER — SODIUM CHLORIDE 0.9% FLUSH
10.0000 mL | INTRAVENOUS | Status: DC | PRN
  Administered 2022-06-05: 10 mL
  Filled 2022-06-05: qty 10

## 2022-06-12 ENCOUNTER — Other Ambulatory Visit: Payer: Self-pay

## 2022-06-12 ENCOUNTER — Emergency Department: Payer: Medicare HMO

## 2022-06-12 ENCOUNTER — Inpatient Hospital Stay
Admission: EM | Admit: 2022-06-12 | Discharge: 2022-06-15 | DRG: 065 | Disposition: A | Discharge status: Home Health | Payer: Medicare HMO | Attending: Internal Medicine | Admitting: Internal Medicine

## 2022-06-12 ENCOUNTER — Telehealth: Payer: Self-pay | Admitting: *Deleted

## 2022-06-12 DIAGNOSIS — R7303 Prediabetes: Secondary | ICD-10-CM | POA: Diagnosis present

## 2022-06-12 DIAGNOSIS — F17210 Nicotine dependence, cigarettes, uncomplicated: Secondary | ICD-10-CM | POA: Diagnosis present

## 2022-06-12 DIAGNOSIS — D61818 Other pancytopenia: Secondary | ICD-10-CM | POA: Diagnosis not present

## 2022-06-12 DIAGNOSIS — E785 Hyperlipidemia, unspecified: Secondary | ICD-10-CM | POA: Diagnosis present

## 2022-06-12 DIAGNOSIS — I6381 Other cerebral infarction due to occlusion or stenosis of small artery: Secondary | ICD-10-CM | POA: Diagnosis not present

## 2022-06-12 DIAGNOSIS — C799 Secondary malignant neoplasm of unspecified site: Secondary | ICD-10-CM | POA: Diagnosis present

## 2022-06-12 DIAGNOSIS — E119 Type 2 diabetes mellitus without complications: Secondary | ICD-10-CM | POA: Diagnosis not present

## 2022-06-12 DIAGNOSIS — I63512 Cerebral infarction due to unspecified occlusion or stenosis of left middle cerebral artery: Secondary | ICD-10-CM | POA: Diagnosis not present

## 2022-06-12 DIAGNOSIS — N400 Enlarged prostate without lower urinary tract symptoms: Secondary | ICD-10-CM | POA: Diagnosis present

## 2022-06-12 DIAGNOSIS — Z9049 Acquired absence of other specified parts of digestive tract: Secondary | ICD-10-CM | POA: Diagnosis not present

## 2022-06-12 DIAGNOSIS — R270 Ataxia, unspecified: Secondary | ICD-10-CM | POA: Diagnosis present

## 2022-06-12 DIAGNOSIS — R29898 Other symptoms and signs involving the musculoskeletal system: Secondary | ICD-10-CM | POA: Diagnosis not present

## 2022-06-12 DIAGNOSIS — I10 Essential (primary) hypertension: Secondary | ICD-10-CM | POA: Diagnosis not present

## 2022-06-12 DIAGNOSIS — Z888 Allergy status to other drugs, medicaments and biological substances status: Secondary | ICD-10-CM | POA: Diagnosis not present

## 2022-06-12 DIAGNOSIS — I69351 Hemiplegia and hemiparesis following cerebral infarction affecting right dominant side: Secondary | ICD-10-CM | POA: Diagnosis not present

## 2022-06-12 DIAGNOSIS — R531 Weakness: Secondary | ICD-10-CM | POA: Diagnosis not present

## 2022-06-12 DIAGNOSIS — Z720 Tobacco use: Secondary | ICD-10-CM | POA: Diagnosis not present

## 2022-06-12 DIAGNOSIS — I6523 Occlusion and stenosis of bilateral carotid arteries: Secondary | ICD-10-CM | POA: Diagnosis not present

## 2022-06-12 DIAGNOSIS — R471 Dysarthria and anarthria: Secondary | ICD-10-CM | POA: Diagnosis present

## 2022-06-12 DIAGNOSIS — I771 Stricture of artery: Secondary | ICD-10-CM | POA: Diagnosis not present

## 2022-06-12 DIAGNOSIS — K219 Gastro-esophageal reflux disease without esophagitis: Secondary | ICD-10-CM | POA: Diagnosis not present

## 2022-06-12 DIAGNOSIS — R55 Syncope and collapse: Secondary | ICD-10-CM | POA: Diagnosis not present

## 2022-06-12 DIAGNOSIS — I639 Cerebral infarction, unspecified: Secondary | ICD-10-CM | POA: Diagnosis not present

## 2022-06-12 DIAGNOSIS — I4510 Unspecified right bundle-branch block: Secondary | ICD-10-CM | POA: Diagnosis present

## 2022-06-12 DIAGNOSIS — C189 Malignant neoplasm of colon, unspecified: Secondary | ICD-10-CM | POA: Diagnosis not present

## 2022-06-12 DIAGNOSIS — I6502 Occlusion and stenosis of left vertebral artery: Secondary | ICD-10-CM | POA: Diagnosis not present

## 2022-06-12 DIAGNOSIS — Z79899 Other long term (current) drug therapy: Secondary | ICD-10-CM

## 2022-06-12 LAB — CBC
HCT: 33.5 % — ABNORMAL LOW (ref 39.0–52.0)
Hemoglobin: 10.6 g/dL — ABNORMAL LOW (ref 13.0–17.0)
MCH: 31.7 pg (ref 26.0–34.0)
MCHC: 31.6 g/dL (ref 30.0–36.0)
MCV: 100.3 fL — ABNORMAL HIGH (ref 80.0–100.0)
Platelets: 180 10*3/uL (ref 150–400)
RBC: 3.34 MIL/uL — ABNORMAL LOW (ref 4.22–5.81)
RDW: 16.2 % — ABNORMAL HIGH (ref 11.5–15.5)
WBC: 3.1 10*3/uL — ABNORMAL LOW (ref 4.0–10.5)
nRBC: 0 % (ref 0.0–0.2)

## 2022-06-12 LAB — DIFFERENTIAL
Abs Immature Granulocytes: 0.01 10*3/uL (ref 0.00–0.07)
Basophils Absolute: 0 10*3/uL (ref 0.0–0.1)
Basophils Relative: 1 %
Eosinophils Absolute: 0.2 10*3/uL (ref 0.0–0.5)
Eosinophils Relative: 5 %
Immature Granulocytes: 0 %
Lymphocytes Relative: 21 %
Lymphs Abs: 0.7 10*3/uL (ref 0.7–4.0)
Monocytes Absolute: 0.4 10*3/uL (ref 0.1–1.0)
Monocytes Relative: 13 %
Neutro Abs: 1.8 10*3/uL (ref 1.7–7.7)
Neutrophils Relative %: 60 %

## 2022-06-12 LAB — COMPREHENSIVE METABOLIC PANEL
ALT: 22 U/L (ref 0–44)
AST: 32 U/L (ref 15–41)
Albumin: 3 g/dL — ABNORMAL LOW (ref 3.5–5.0)
Alkaline Phosphatase: 156 U/L — ABNORMAL HIGH (ref 38–126)
Anion gap: 4 — ABNORMAL LOW (ref 5–15)
BUN: 13 mg/dL (ref 8–23)
CO2: 28 mmol/L (ref 22–32)
Calcium: 8.4 mg/dL — ABNORMAL LOW (ref 8.9–10.3)
Chloride: 108 mmol/L (ref 98–111)
Creatinine, Ser: 0.75 mg/dL (ref 0.61–1.24)
GFR, Estimated: >60 mL/min (ref >60)
Glucose, Bld: 101 mg/dL — ABNORMAL HIGH (ref 70–99)
Potassium: 4.2 mmol/L (ref 3.5–5.1)
Sodium: 140 mmol/L (ref 135–145)
Total Bilirubin: 0.9 mg/dL (ref 0.3–1.2)
Total Protein: 7.1 g/dL (ref 6.5–8.1)

## 2022-06-12 LAB — CBG MONITORING, ED: Glucose-Capillary: 84 mg/dL (ref 70–99)

## 2022-06-12 LAB — ETHANOL: Alcohol, Ethyl (B): <10 mg/dL (ref <10)

## 2022-06-12 LAB — PROTIME-INR
INR: 1.2 (ref 0.8–1.2)
Prothrombin Time: 15.1 seconds (ref 11.4–15.2)

## 2022-06-12 LAB — APTT: aPTT: 31 seconds (ref 24–36)

## 2022-06-12 MED ORDER — ACETAMINOPHEN 325 MG PO TABS: 650.0000 mg | ORAL_TABLET | Freq: Four times a day (QID) | ORAL | Status: DC | PRN

## 2022-06-12 MED ORDER — ENOXAPARIN SODIUM 40 MG/0.4ML IJ SOSY
40.0000 mg | PREFILLED_SYRINGE | INTRAMUSCULAR | Status: DC
  Filled 2022-06-12: qty 0.4

## 2022-06-12 MED ORDER — AMLODIPINE BESYLATE 5 MG PO TABS: 5.0000 mg | ORAL_TABLET | Freq: Every day | ORAL | Status: DC

## 2022-06-12 MED ORDER — ATORVASTATIN CALCIUM 20 MG PO TABS
20.0000 mg | ORAL_TABLET | Freq: Every day | ORAL | Status: DC
  Administered 2022-06-13 – 2022-06-15 (×3): 20 mg via ORAL
  Filled 2022-06-12 (×3): qty 1

## 2022-06-12 MED ORDER — OXYCODONE HCL 5 MG PO TABS: 5.0000 mg | ORAL_TABLET | Freq: Three times a day (TID) | ORAL | Status: DC | PRN

## 2022-06-12 MED ORDER — MAGNESIUM HYDROXIDE 400 MG/5ML PO SUSP
30.0000 mL | Freq: Every day | ORAL | Status: DC | PRN
Start: 2022-06-12 — End: 2022-06-15

## 2022-06-12 MED ORDER — STROKE: EARLY STAGES OF RECOVERY BOOK: Freq: Once | Status: AC

## 2022-06-12 MED ORDER — OXYCODONE HCL 5 MG PO TABS
5.0000 mg | ORAL_TABLET | Freq: Once | ORAL | Status: AC
  Administered 2022-06-12: 5 mg via ORAL
  Filled 2022-06-12: qty 1

## 2022-06-12 MED ORDER — ONDANSETRON HCL 4 MG PO TABS: 4.0000 mg | ORAL_TABLET | Freq: Four times a day (QID) | ORAL | Status: DC | PRN

## 2022-06-12 MED ORDER — ONDANSETRON HCL 4 MG/2ML IJ SOLN: 4.0000 mg | Freq: Four times a day (QID) | INTRAMUSCULAR | Status: DC | PRN

## 2022-06-12 MED ORDER — ASPIRIN 81 MG PO CHEW
324.0000 mg | CHEWABLE_TABLET | Freq: Once | ORAL | Status: AC
  Administered 2022-06-12: 324 mg via ORAL
  Filled 2022-06-12: qty 4

## 2022-06-12 MED ORDER — ACETAMINOPHEN 650 MG RE SUPP: 650.0000 mg | Freq: Four times a day (QID) | RECTAL | Status: DC | PRN

## 2022-06-12 MED ORDER — TRAZODONE HCL 50 MG PO TABS
25.0000 mg | ORAL_TABLET | Freq: Every evening | ORAL | Status: DC | PRN
  Administered 2022-06-13: 25 mg via ORAL
  Filled 2022-06-12: qty 1

## 2022-06-12 MED ORDER — SODIUM CHLORIDE 0.9 % IV SOLN: INTRAVENOUS | Status: DC

## 2022-06-12 NOTE — Telephone Encounter (Signed)
I personally call daughter/pt at 1545. Pt did not go to the ER. I spoke with daughter, who stated pt refused to go. I asked to speak to the patient. He is still reporting weakness/paralysis. No improvement in symptoms. I advised pt to go to ER to rule out a stroke. Pt stated he did not want to go to the ER. I advised pt that the ER is the best place for him to go to r/o a stroke. He stated if Dr. B wants him to go, he will go to the ER.

## 2022-06-12 NOTE — ED Triage Notes (Signed)
Pt presents to ED with c/o of R arm numbness that started at 0900 this morning. Pt is having some slurred speech, daughter states this is not his normal. Pt has equal grips to upper extremities but is having trouble using his R arm to touch his nose then this RN's finger and can do it perfect with the L side.   Pt denies hitting his head, pt denies taking blood thinners.

## 2022-06-12 NOTE — Telephone Encounter (Signed)
I spoke with Dr. Rogue Bussing. MD agreed that pt should go to the ER. R/o stroke. Pt is on Avastin.

## 2022-06-12 NOTE — Telephone Encounter (Signed)
After discussion with [facility] RN, it was recommended that EMS be called to transport him to ER.  I called Maryann back and informed her of this stating concern for a stroke and she said, "Alright, I will let him know"

## 2022-06-12 NOTE — ED Notes (Signed)
Patient transported to MRI 

## 2022-06-12 NOTE — ED Provider Notes (Signed)
[facility] Provider Note    Event Date/Time   First MD Initiated Contact with Patient 06/12/22 1913     (approximate)   History   Numbness   HPI  KARANVEER RAMAKRISHNAN is a 81 y.o. male who presents to the emergency department today because of concerns for right-sided weakness as well as slurred speech.  Patient noticed the symptoms when he woke up from sleep.  He realized it was he was trying to get out of bed and was having a hard time getting out of the bed into another room.  He also has noticed that he is having some difficulty with his speech.  Symptoms have persisted throughout the day.  They have not been accompanied by any headache.  Patient says that he had similar symptoms once in the past although not as severe.  Had gone to an emergency department at that time but left before being seen.   Physical Exam   Triage Vital Signs: ED Triage Vitals  Enc Vitals Group     BP 06/12/22 1642 134/88     Pulse Rate 06/12/22 1642 69     Resp 06/12/22 1642 17     Temp 06/12/22 1642 98 F (36.7 C)     Temp Source 06/12/22 1642 Oral     SpO2 06/12/22 1642 100 %     Weight --      Height --      Head Circumference --      Peak Flow --      Pain Score 06/12/22 1643 0     Pain Loc --      Pain Edu? --      Excl. in [place]? --     Most recent vital signs: Vitals:   06/12/22 1845 06/12/22 1900  BP: (!) 171/85 (!) 161/83  Pulse: 72 67  Resp:    Temp:    SpO2: 100% 100%   General: Awake, no distress.  CV:  Good peripheral perfusion.  Resp:  Normal effort.  Abd:  No distention.  Other:  Right upper and lower extremity weakness. Slurred speech.    ED Results / Procedures / Treatments   Labs (all labs ordered are listed, but only abnormal results are displayed) Labs Reviewed  CBC - Abnormal; Notable for the following components:      Result Value   WBC 3.1 (*)    RBC 3.34 (*)    Hemoglobin 10.6 (*)    HCT 33.5 (*)    MCV 100.3 (*)    RDW 16.2 (*)     All other components within normal limits  COMPREHENSIVE METABOLIC PANEL - Abnormal; Notable for the following components:   Glucose, Bld 101 (*)    Calcium 8.4 (*)    Albumin 3.0 (*)    Alkaline Phosphatase 156 (*)    Anion gap 4 (*)    All other components within normal limits  PROTIME-INR  APTT  DIFFERENTIAL  ETHANOL  CBG MONITORING, ED  CBG MONITORING, ED     RADIOLOGY I independently interpreted and visualized the CT head. My interpretation: No acute bleed Radiology interpretation:  IMPRESSION:  No acute intracranial abnormality.   MR brain IMPRESSION:  Small foci of acute/subacute ischemia in the left internal capsule  posterior limb and left occipital lobe. No hemorrhage or mass  effect.     PROCEDURES:  Critical Care performed: No  Procedures   MEDICATIONS ORDERED IN ED: Medications - No data to display  IMPRESSION / MDM / ASSESSMENT AND PLAN / ED COURSE  I reviewed the triage vital signs and the nursing notes.                              Differential diagnosis includes, but is not limited to, infection, anemia, CVA  Patient's presentation is most consistent with acute presentation with potential threat to life or bodily function.  Patient presented to the emergency department today because of concern for right sided weakness and slurred speech. Unforutnatly last known well was yesterday when the patient went to bed, so outside the window for thrombolytics. Head CT did not show any concerning abnormality but given concern for CVA MRI was obtained. This did show findings concerning for acute CVA. Discussed this with the patient. Discussed with Dr. Sidney Ace with the hospitalist service who will plan on admission.   FINAL CLINICAL IMPRESSION(S) / ED DIAGNOSES   Final diagnoses:  Cerebrovascular accident (CVA), unspecified mechanism ([place])      Note:  This document was prepared using Dragon voice recognition software and may include unintentional  dictation errors.    Nance Pear, MD 06/12/22 2130

## 2022-06-12 NOTE — ED Notes (Signed)
Request made for transport to the floor ?

## 2022-06-12 NOTE — H&P (Incomplete)
[place]   PATIENT NAME: Oscar [name]    MR#:  [id number]  DATE OF BIRTH:  Jun 12, 1941  DATE OF ADMISSION:  06/12/2022  PRIMARY CARE PHYSICIAN: Sofie Hartigan, MD   Patient is coming from: Home  REQUESTING/REFERRING PHYSICIAN: Nance Pear, MD  CHIEF COMPLAINT:   Chief Complaint  Patient presents with   Numbness    HISTORY OF PRESENT ILLNESS:  Oscar [name] is a 81 y.o. Caucasian male with medical history significant for dyslipidemia, prediabetes, colon cancer status post colectomy, who presented to the emergency room with acute onset of right-sided upper and lower extremity weakness and slurred speech when he woke up.  He denied any headache or dizziness or blurred vision.  He admitted to vertigo without tinnitus and has been having mild ataxia.  No urinary or stool incontinence.  No witnessed seizures.  No fever or chills.  No dysuria, oliguria or hematuria or flank pain.  No chest pain or palpitations.  No cough or wheezing or dyspnea.  No head injuries or trauma.  ED Course: Upon presentation to the emergency room, vital signs were within normal.  Labs revealed calcium of 8.4 and alk phos 156 with albumin of 3 and CBC showed leukopenia 3.1 anemia that is close to baseline.  Alcohol level was less than 10. EKG as reviewed by me : EKG showed sinus rhythm with a rate of 66 with PACs, right bundle branch block Q waves inferiorly. Imaging: Noncontrasted head CT scan revealed no acute intracranial normalities.  Brain MRI without contrast revealed small foci of acute/subacute ischemia in the left internal capsule posterior limb and left occipital lobe with no hemorrhage or mass.  The patient was given 4 baby aspirin and oxycodone 5 mg.  He will be admitted to an observation medical telemetry bed for further evaluation and management. PAST MEDICAL HISTORY:   Past Medical History:  Diagnosis Date   Arthritis    Cancer ([place])    colon cancer 02/2019 per pt     Family history of adverse reaction to anesthesia    cousin took all night to wake up from anesthesia   H/O colon cancer, stage IV    Hyperlipemia    Neuromuscular disorder ([place])    Pre-diabetes     PAST SURGICAL HISTORY:   Past Surgical History:  Procedure Laterality Date   COLON SURGERY     ESOPHAGOGASTRODUODENOSCOPY (EGD) WITH PROPOFOL N/A 12/26/2020   Procedure: ESOPHAGOGASTRODUODENOSCOPY (EGD) WITH PROPOFOL;  Surgeon: [name], MD;  Location: [facility] ENDOSCOPY;  Service: Gastroenterology;  Laterality: N/A;   IR ANGIOGRAM SELECTIVE EACH ADDITIONAL VESSEL  12/19/2020   IR ANGIOGRAM SELECTIVE EACH ADDITIONAL VESSEL  03/24/2021   IR ANGIOGRAM SELECTIVE EACH ADDITIONAL VESSEL  03/24/2021   IR ANGIOGRAM SELECTIVE EACH ADDITIONAL VESSEL  04/04/2021   IR ANGIOGRAM SELECTIVE EACH ADDITIONAL VESSEL  04/04/2021   IR ANGIOGRAM SELECTIVE EACH ADDITIONAL VESSEL  04/04/2021   IR ANGIOGRAM VISCERAL SELECTIVE  12/19/2020   IR ANGIOGRAM VISCERAL SELECTIVE  03/24/2021   IR ANGIOGRAM VISCERAL SELECTIVE  04/04/2021   IR ANGIOGRAM VISCERAL SELECTIVE  04/04/2021   IR EMBO ARTERIAL NOT HEMORR HEMANG INC GUIDE ROADMAPPING  03/24/2021   IR EMBO TUMOR ORGAN ISCHEMIA INFARCT INC GUIDE ROADMAPPING  12/19/2020   IR EMBO TUMOR ORGAN ISCHEMIA INFARCT INC GUIDE ROADMAPPING  04/04/2021   IR IMAGING GUIDED PORT INSERTION  07/20/2019   IR RADIOLOGIST EVAL & MGMT  12/05/2020   IR RADIOLOGIST EVAL & MGMT  02/11/2021  IR RADIOLOGIST EVAL & MGMT  03/04/2021   IR RADIOLOGIST EVAL & MGMT  04/29/2021   IR RADIOLOGIST EVAL & MGMT  07/22/2021   IR RADIOLOGIST EVAL & MGMT  11/19/2021   IR US GUIDE VASC ACCESS RIGHT  12/19/2020   IR US GUIDE VASC ACCESS RIGHT  03/24/2021   IR US GUIDE VASC ACCESS RIGHT  04/04/2021   JOINT REPLACEMENT Left 2010   Knee   RADIOLOGY WITH ANESTHESIA N/A 01/15/2021   Procedure: CT WITH ANESTHESIA MICROWAVE ABLATION OF LIVER;  Surgeon: Criselda Peaches, MD;  Location: [facility] ORS;  Service: Anesthesiology;  Laterality: N/A;     SOCIAL HISTORY:   Social History   Tobacco Use   Smoking status: Every Day    Packs/day: 0.25    Years: 15.00    Total pack years: 3.75    Types: Cigarettes   Smokeless tobacco: Never   Tobacco comments:    1 to 2 cigarettes a day occasionally  Substance Use Topics   Alcohol use: Yes    Comment: rarely     FAMILY HISTORY:   Family History  Problem Relation Age of Onset   Peptic Ulcer Disease Father     DRUG ALLERGIES:   Allergies  Allergen Reactions   Ace Inhibitors Swelling    REVIEW OF SYSTEMS:   ROS As per history of present illness. All pertinent systems were reviewed above. Constitutional, HEENT, cardiovascular, respiratory, GI, GU, musculoskeletal, neuro, psychiatric, endocrine, integumentary and hematologic systems were reviewed and are otherwise negative/unremarkable except for positive findings mentioned above in the HPI.   MEDICATIONS AT HOME:   Prior to Admission medications   Medication Sig Start Date End Date Taking? Authorizing Provider  amLODipine (NORVASC) 5 MG tablet Take 5 mg by mouth daily. Patient not taking: Reported on 10/03/2021    [provider]  diphenoxylate-atropine (LOMOTIL) 2.5-0.025 MG tablet Take 1 tablet by mouth 4 (four) times daily as needed for diarrhea or loose stools. Take it along with immodium Patient not taking: Reported on 02/23/2022 02/06/22   Cammie Sickle, MD  ondansetron (ZOFRAN) 8 MG tablet Take 1 tablet (8 mg total) by mouth every 8 (eight) hours as needed for nausea or vomiting. Patient not taking: Reported on 03/09/2022 02/25/22   Cammie Sickle, MD  oxyCODONE (OXY IR/ROXICODONE) 5 MG immediate release tablet Take 1 tablet (5 mg total) by mouth every 8 (eight) hours as needed for severe pain. 06/03/22   Cammie Sickle, MD  polyethylene glycol powder (GLYCOLAX/MIRALAX) 17 GM/SCOOP powder Take by mouth as needed. Patient not taking: Reported on 12/29/2021    [provider]   pravastatin (PRAVACHOL) 40 MG tablet Take 40 mg by mouth daily.  Patient not taking: Reported on 06/09/2021    [provider]  predniSONE (DELTASONE) 20 MG tablet Take 1 tablet (20 mg total) by mouth daily with breakfast. Patient not taking: Reported on 02/06/2022 12/08/21   Cammie Sickle, MD  prochlorperazine (COMPAZINE) 10 MG tablet Take 1 tablet (10 mg total) by mouth every 6 (six) hours as needed for nausea or vomiting. Patient not taking: Reported on 03/09/2022 02/25/22   Cammie Sickle, MD  regorafenib (STIVARGA) 40 MG tablet Take 2 tablets (80 mg total) by mouth daily with breakfast. Take for 21 days, then hold for 7 days. Repeat every 28 days. Patient not taking: Reported on 02/06/2022 01/26/22   Cammie Sickle, MD  senna (SENOKOT) 8.6 MG TABS tablet Take 1 tablet (8.6 mg total)  by mouth daily. Patient not taking: Reported on 12/29/2021 11/25/21   Borders, Kirt Boys, NP  sucralfate (CARAFATE) 1 g tablet Take 1 tablet (1 g total) by mouth 4 (four) times daily -  with meals and at bedtime. Patient not taking: Reported on 11/18/2021 06/09/21   Cammie Sickle, MD  tamsulosin (FLOMAX) 0.4 MG CAPS capsule Take 1 capsule (0.4 mg total) by mouth daily. Patient not taking: Reported on 04/06/2022 01/20/22 01/20/23  Borders, Kirt Boys, NP      VITAL SIGNS:  Blood pressure (!) 143/79, pulse 72, temperature 97.6 F (36.4 C), resp. rate 16, SpO2 100 %.  PHYSICAL EXAMINATION:  Physical Exam  GENERAL:  81 y.o.-year-old Caucasian male patient lying in the bed with no acute distress.  EYES: Pupils equal, round, reactive to light and accommodation. No scleral icterus. Extraocular muscles intact.  HEENT: Head atraumatic, normocephalic. Oropharynx and nasopharynx clear.  NECK:  Supple, no jugular venous distention. No thyroid enlargement, no tenderness.  LUNGS: Normal breath sounds bilaterally, no wheezing, rales,rhonchi or crepitation. No use of accessory muscles of respiration.   CARDIOVASCULAR: Regular rate and rhythm, S1, S2 normal. No murmurs, rubs, or gallops.  ABDOMEN: Soft, nondistended, nontender. Bowel sounds present. No organomegaly or mass.  EXTREMITIES: No pedal edema, cyanosis, or clubbing.  NEUROLOGIC: Cranial nerves II through XII are intact except for minimally slurred speech and right-sided facial droop. Muscle strength 4/5 in the right upper and lower extremity, compared to 5/5 in the left upper and lower extremities.  Extremities. Sensation intact. Gait not checked.  PSYCHIATRIC: The patient is alert and oriented x 3.  Normal affect and good eye contact. SKIN: No obvious rash, lesion, or ulcer.   LABORATORY PANEL:   CBC Recent Labs  Lab 06/12/22 1656  WBC 3.1*  HGB 10.6*  HCT 33.5*  PLT 180   ------------------------------------------------------------------------------------------------------------------  Chemistries  Recent Labs  Lab 06/12/22 1656  NA 140  K 4.2  CL 108  CO2 28  GLUCOSE 101*  BUN 13  CREATININE 0.75  CALCIUM 8.4*  AST 32  ALT 22  ALKPHOS 156*  BILITOT 0.9   ------------------------------------------------------------------------------------------------------------------  Cardiac Enzymes No results for input(s): "TROPONINI" in the last 168 hours. ------------------------------------------------------------------------------------------------------------------  RADIOLOGY:  MR BRAIN WO CONTRAST  Result Date: 06/12/2022 CLINICAL DATA:  Right-sided weakness EXAM: MRI HEAD WITHOUT CONTRAST TECHNIQUE: Multiplanar, multiecho pulse sequences of the brain and surrounding structures were obtained without intravenous contrast. COMPARISON:  None Available. FINDINGS: Brain: There are small foci of acute/subacute ischemia the left internal capsule posterior limb and in the left occipital lobe. No acute or chronic hemorrhage. There is multifocal hyperintense T2-weighted signal within the white matter. Generalized volume  loss. Old left basal ganglia small vessel infarct. The midline structures are normal. Vascular: Major flow voids are preserved. Skull and upper cervical spine: Normal calvarium and skull base. Visualized upper cervical spine and soft tissues are normal. Sinuses/Orbits:No paranasal sinus fluid levels or advanced mucosal thickening. No mastoid or middle ear effusion. Normal orbits. IMPRESSION: Small foci of acute/subacute ischemia in the left internal capsule posterior limb and left occipital lobe. No hemorrhage or mass effect. Electronically Signed   By: Ulyses Jarred M.D.   On: 06/12/2022 21:01   CT HEAD WO CONTRAST  Result Date: 06/12/2022 CLINICAL DATA:  Neurological deficit EXAM: CT HEAD WITHOUT CONTRAST TECHNIQUE: Contiguous axial images were obtained from the base of the skull through the vertex without intravenous contrast. RADIATION DOSE REDUCTION: This exam was performed according to the departmental  dose-optimization program which includes automated exposure control, adjustment of the mA and/or kV according to patient size and/or use of iterative reconstruction technique. COMPARISON:  Head CT dated November 09, 2020 FINDINGS: Brain: Old lacunar infarcts of the bilateral basal ganglia. No evidence of acute infarction, hemorrhage, hydrocephalus, extra-axial collection or mass lesion/mass effect. Vascular: No hyperdense vessel or unexpected calcification. Skull: Normal. Negative for fracture or focal lesion. Sinuses/Orbits: No acute finding. Other: None. IMPRESSION: No acute intracranial abnormality. Electronically Signed   By: Yetta Glassman M.D.   On: 06/12/2022 17:46      IMPRESSION AND PLAN:  Assessment and Plan: * Acute cerebral infarction [facility]) - The patient has acute/subacute left internal capsule posterior limb and left occipital lobe infarction. - He has subsequent right-sided hemiparesis and improving slurred speech. - We will place him in observation in medical telemetry bed. - PT/OT  and ST consults will be obtained. - We will obtain an bilateral carotid Doppler and 2D echo with bubble study. - We will check fasting lipids and place him on statin therapy. - He will be placed on aspirin and Plavix. - Neurology, PT/OT and ST consults will be obtained. - I notified Dr. Cheral Marker about the patient.  GERD (gastroesophageal reflux disease) We will continue  Carafate.  Dyslipidemia - We will continue statin therapy.   Essential hypertension -We will continue his antihypertensive therapy.  BPH (benign prostatic hyperplasia) - We will continue Flomax   DVT prophylaxis: Lovenox.  Advanced Care Planning:  Code Status: full code.  Family Communication:  The plan of care was discussed in details with the patient (and family). I answered all questions. The patient agreed to proceed with the above mentioned plan. Further management will depend upon hospital course. Disposition Plan: Back to previous home environment Consults called: Neurology All the records are reviewed and case discussed with ED provider.  Status is: Observation  I certify that at the time of admission, it is my clinical judgment that the patient will require hospital care extending less than 2 midnights.                            Dispo: The patient is from: Home              Anticipated d/c is to: Home              Patient currently is not medically stable to d/c.              Difficult to place patient: No  Christel Mormon M.D on 06/13/2022 at 2:50 AM  [facility]   From 7 PM-7 AM, contact night-coverage [email or web address]  CC: Primary care physician; Sofie Hartigan, MD

## 2022-06-12 NOTE — Telephone Encounter (Signed)
Daughter Velta Addison called stating that patient is feeling weaker and she would like for him to be seen today. He is eating and drinking well. It started this morning stating that his legs are weak and his right arm is very shaky had to use his left arm to feed himself. He was unable to assist getting his wife up this morning which is something he does every day. He does not have drooping of face, there is noted weaker grip in right hand compared to left and he is right handed.She reports that his gait is slow and deliberate.  Last treatment was 8/16-18

## 2022-06-13 ENCOUNTER — Observation Stay: Payer: Medicare HMO

## 2022-06-13 DIAGNOSIS — I10 Essential (primary) hypertension: Secondary | ICD-10-CM

## 2022-06-13 DIAGNOSIS — K219 Gastro-esophageal reflux disease without esophagitis: Secondary | ICD-10-CM

## 2022-06-13 DIAGNOSIS — R55 Syncope and collapse: Secondary | ICD-10-CM | POA: Diagnosis not present

## 2022-06-13 DIAGNOSIS — I639 Cerebral infarction, unspecified: Secondary | ICD-10-CM | POA: Diagnosis not present

## 2022-06-13 DIAGNOSIS — E119 Type 2 diabetes mellitus without complications: Secondary | ICD-10-CM | POA: Diagnosis not present

## 2022-06-13 DIAGNOSIS — E785 Hyperlipidemia, unspecified: Secondary | ICD-10-CM

## 2022-06-13 DIAGNOSIS — Z720 Tobacco use: Secondary | ICD-10-CM | POA: Diagnosis not present

## 2022-06-13 LAB — CBC
HCT: 28.5 % — ABNORMAL LOW (ref 39.0–52.0)
Hemoglobin: 9.4 g/dL — ABNORMAL LOW (ref 13.0–17.0)
MCH: 31.8 pg (ref 26.0–34.0)
MCHC: 33 g/dL (ref 30.0–36.0)
MCV: 96.3 fL (ref 80.0–100.0)
Platelets: 127 10*3/uL — ABNORMAL LOW (ref 150–400)
RBC: 2.96 MIL/uL — ABNORMAL LOW (ref 4.22–5.81)
RDW: 16.4 % — ABNORMAL HIGH (ref 11.5–15.5)
WBC: 2.9 10*3/uL — ABNORMAL LOW (ref 4.0–10.5)
nRBC: 0 % (ref 0.0–0.2)

## 2022-06-13 LAB — BASIC METABOLIC PANEL
Anion gap: 3 — ABNORMAL LOW (ref 5–15)
BUN: 12 mg/dL (ref 8–23)
CO2: 24 mmol/L (ref 22–32)
Calcium: 7.9 mg/dL — ABNORMAL LOW (ref 8.9–10.3)
Chloride: 111 mmol/L (ref 98–111)
Creatinine, Ser: 0.68 mg/dL (ref 0.61–1.24)
GFR, Estimated: >60 mL/min (ref >60)
Glucose, Bld: 93 mg/dL (ref 70–99)
Potassium: 3.9 mmol/L (ref 3.5–5.1)
Sodium: 138 mmol/L (ref 135–145)

## 2022-06-13 LAB — LIPID PANEL
Cholesterol: 126 mg/dL (ref 0–200)
HDL: 40 mg/dL — ABNORMAL LOW (ref >40)
LDL Cholesterol: 79 mg/dL (ref 0–99)
Total CHOL/HDL Ratio: 3.2 RATIO
Triglycerides: 36 mg/dL (ref <150)
VLDL: 7 mg/dL (ref 0–40)

## 2022-06-13 LAB — HEMOGLOBIN A1C
Hgb A1c MFr Bld: 5.4 % (ref 4.8–5.6)
Mean Plasma Glucose: 108.28 mg/dL

## 2022-06-13 MED ORDER — ORAL CARE MOUTH RINSE: 15.0000 mL | OROMUCOSAL | Status: DC | PRN

## 2022-06-13 MED ORDER — CLOPIDOGREL BISULFATE 75 MG PO TABS
75.0000 mg | ORAL_TABLET | Freq: Every day | ORAL | Status: DC
  Administered 2022-06-13 – 2022-06-14 (×2): 75 mg via ORAL
  Filled 2022-06-13 (×2): qty 1

## 2022-06-13 NOTE — TOC Initial Note (Addendum)
Transition of Care [facility]) - Initial/Assessment Note    Patient Details  Name: Oscar [name] MRN: [id number] Date of Birth: 12-20-1940  Transition of Care [facility]) CM/SW Contact:    Valente David, RN Phone Number: 06/13/2022, 12:47 PM  Clinical Narrative:                  Patient admitted from home, lives with step daughter and her family.  They provide support as well as transportation to appointments, PCP is Dr. Ellison Hughs.  Encouraged to schedule follow up at discharge, state he has not see him in almost 3 years.  [facility]    Advised that PT/OT recommended [facility] services for PT/OT, he agrees and does not have a preference.    1615: SLP evaluation, recommended home health SLP.  Call placed to [facility], message left.  Will also contact [place] with [name] and [place] with [place].   Expected Discharge Plan: [place] Barriers to Discharge: Continued Medical Work up   Patient Goals and CMS Choice Patient states their goals for this hospitalization and ongoing recovery are:: Home with home health CMS Medicare.gov Compare Post Acute Care list provided to:: Patient Choice offered to / list presented to : Patient  Expected Discharge Plan and Services Expected Discharge Plan: [place]       Living arrangements for the past 2 months: Single Family Home                                      Prior Living Arrangements/Services Living arrangements for the past 2 months: Single Family Home Lives with:: Adult Children Patient language and need for interpreter reviewed:: Yes Do you feel safe going back to the place where you live?: Yes      Need for Family Participation in Patient Care: Yes (Comment) Care giver support system in place?: Yes (comment)   Criminal Activity/Legal Involvement Pertinent to Current Situation/Hospitalization: No - Comment as needed  Activities of Daily Living Home Assistive  Devices/Equipment: Dentures (specify type), Eyeglasses ADL Screening (condition at time of admission) Patient's cognitive ability adequate to safely complete daily activities?: Yes Is the patient deaf or have difficulty hearing?: No Does the patient have difficulty seeing, even when wearing glasses/contacts?: No Does the patient have difficulty concentrating, remembering, or making decisions?: No Patient able to express need for assistance with ADLs?: Yes Does the patient have difficulty dressing or bathing?: No Independently performs ADLs?: Yes (appropriate for developmental age) Does the patient have difficulty walking or climbing stairs?: No Weakness of Legs: Right Weakness of Arms/Hands: Right  Permission Sought/Granted Permission sought to share information with : Case Manager Permission granted to share information with : Yes, Verbal Permission Granted  Share Information with NAME: Tommi Rumps and [facility] granted to share info w AGENCY: [facility]        Emotional Assessment   Attitude/Demeanor/Rapport: Engaged Affect (typically observed): Appropriate Orientation: : Oriented to Self, Oriented to Place, Oriented to  Time, Oriented to Situation   Psych Involvement: No (comment)  Admission diagnosis:  Acute cerebral infarction [facility]) [I63.9] Cerebrovascular accident (CVA), unspecified mechanism ([place]) [I63.9] Patient Active Problem List   Diagnosis Date Noted   Essential hypertension 06/13/2022   Dyslipidemia 06/13/2022   GERD (gastroesophageal reflux disease) 06/13/2022   Acute cerebral infarction ([place]) 06/12/2022   Genetic testing 05/19/2022   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   Goals of care, counseling/discussion 07/13/2019   Cancer of right colon ([place]) 07/05/2019   Colon cancer metastasized to liver ([place]) 06/07/2019   Mass of multiple sites of liver 03/09/2019   Other specified diseases of intestine 03/09/2019   Primary osteoarthritis of both knees 03/09/2019    Tobacco abuse 03/09/2019   Acute phlegmonous appendicitis 03/04/2019   BPH (benign prostatic hyperplasia) 08/06/2014   Diabetes mellitus type 2, uncomplicated ([place]) 76/80/8811   Hyperlipidemia 08/06/2014   Hypertension 08/06/2014   Seasonal allergic rhinitis 08/06/2014   PCP:  Sofie Hartigan, MD [facility]   [id number] [place] [place]Phone: [phone]: [phone]    Social Determinants of Health (SDOH) Interventions    Readmission Risk Interventions     No data to display

## 2022-06-13 NOTE — Assessment & Plan Note (Signed)
- 

## 2022-06-13 NOTE — Assessment & Plan Note (Signed)
-  We will continue his antihypertensive therapy. 

## 2022-06-13 NOTE — Assessment & Plan Note (Signed)
-   The patient has acute/subacute left internal capsule posterior limb and left occipital lobe infarction. - He has subsequent right-sided hemiparesis and improving slurred speech. - We will place him in observation in medical telemetry bed. - PT/OT and ST consults will be obtained. - We will obtain an bilateral carotid Doppler and 2D echo with bubble study. - We will check fasting lipids and place him on statin therapy. - He will be placed on aspirin and Plavix. - Neurology, PT/OT and ST consults will be obtained. - I notified Dr. Cheral Marker about the patient.

## 2022-06-13 NOTE — Plan of Care (Signed)
   Problem: Pain Managment: Goal: General experience of comfort will improve Outcome: Progressing   Problem: Safety: Goal: Ability to remain free from injury will improve Outcome: Progressing   Problem: Education: Goal: Knowledge of disease or condition will improve Outcome: Progressing Goal: Knowledge of secondary prevention will improve (SELECT ALL) 06/13/2022 0450 by Gray Bernhardt, RN Outcome: Progressing 06/13/2022 0449 by Gray Bernhardt, RN Outcome: Progressing

## 2022-06-13 NOTE — Evaluation (Signed)
Physical Therapy Evaluation Patient Details Name: Oscar [name] MRN: [id number] DOB: Nov 11, 1940 Today's Date: 06/13/2022  History of Present Illness  Pt is a 81 y/o M admitted on 06/12/22 after presenting to the ED with c/o acute onset of R sided UE & LE weakness & slurred speech. Brain MRI without contrast revealed small foci of acute/subacute ischemia in the left internal capsule posterior limb and left occipital lobe with no hemorrhage or mass. PMH: dyslipidemia, prediabetes, colon CA s/p colectomy  Clinical Impression  Pt seen for PT evaluation with pt agreeable to tx. Prior to admission pt was residing on the main level of a 2 level home with a level entry. Pt lives with multiple family members & physically helps care for his wife with dementia. On this date, pt demonstrates RLE functional weakness as noted during gait as well as impaired balance. Pt attempts gait without AD but requires min assist as he frequently reaches for objects for UE support. Provided pt with RW & pt able to ambulate 1 lap around nurses station with CGA. Anticipate pt can go home with family assistance & HHPT f/u. Will continue to follow pt acutely to address balance, R UE/LE NMR, and gait with LRAD.        Recommendations for follow up therapy are one component of a multi-disciplinary discharge planning process, led by the attending physician.  Recommendations may be updated based on patient status, additional functional criteria and insurance authorization.  Follow Up Recommendations Home health PT      Assistance Recommended at Discharge Frequent or constant Supervision/Assistance  Patient can return home with the following  A little help with walking and/or transfers;A little help with bathing/dressing/bathroom;Assistance with cooking/housework;Help with stairs or ramp for entrance    Equipment Recommendations Rolling walker (2 wheels)  Recommendations for Other Services       Functional Status  Assessment Patient has had a recent decline in their functional status and demonstrates the ability to make significant improvements in function in a reasonable and predictable amount of time.     Precautions / Restrictions Precautions Precautions: Fall Restrictions Weight Bearing Restrictions: No      Mobility  Bed Mobility               General bed mobility comments: not observed, pt received & left sitting in recliner    Transfers Overall transfer level: Needs assistance Equipment used: None Transfers: Sit to/from Stand Sit to Stand: Min guard, Supervision                Ambulation/Gait Ambulation/Gait assistance: Min assist, Min guard Gait Distance (Feet):  (6 ft + 170 ft) Assistive device: None, Rolling walker (2 wheels)   Gait velocity: slightly decreased     General Gait Details: Pt initially ambulates without AD with min assist with pt reaching for objects to steady himself during gait. Provided pt with RW & pt ambulates around nurses station with CGA, 1 LOB with min assist to correct. As pt fatigues pt begins dragging R foot behind him with decreased hip flexion during swing phase, decreased foot clearance, decreased heel strike.  Stairs            Wheelchair Mobility    Modified Rankin (Stroke Patients Only)       Balance Overall balance assessment: Needs assistance Sitting-balance support: No upper extremity supported, Feet supported       Standing balance support: No upper extremity supported, During functional activity Standing balance-Leahy Scale: Fair  Pertinent Vitals/Pain Pain Assessment Pain Assessment: No/denies pain    Home Living Family/patient expects to be discharged to:: Private residence Living Arrangements: Spouse/significant other;Children Available Help at Discharge: Family;Available 24 hours/day Type of Home: House Home Access: Level entry       Home Layout:  Multi-level;Able to live on main level with bedroom/bathroom   Additional Comments: lives with wife, step daughter, son in law, and grandchildren ages 12-25    Prior Function Prior Level of Function : Independent/Modified Independent             Mobility Comments: Pt reports he stumbled x 1 time in the past 6 months. Assists with caring for wife with dementia (helps her up & down). ADLs Comments: recently quit driving and golfing 2/2 weakness     Hand Dominance   Dominant Hand: Right    Extremity/Trunk Assessment   Upper Extremity Assessment Upper Extremity Assessment: Defer to OT evaluation RUE Deficits / Details: grossly 4/5, decreased [place], c/o ongoing sensory deficits in RUE    Lower Extremity Assessment Lower Extremity Assessment:  (pt with [facility] RLE AROM but does demonstrate weakness during functional mobility with fatigue; BLE sensation equal & intact to light touch. BLE heel to shin equal & intact.)    Cervical / Trunk Assessment Cervical / Trunk Assessment: Normal  Communication   Communication: Expressive difficulties (mild aphasia)  Cognition Arousal/Alertness: Awake/alert Behavior During Therapy: WFL for tasks assessed/performed Overall Cognitive Status: Within Functional Limits for tasks assessed                                 General Comments: pleasant man, follows commands throughout session without issue        General Comments      Exercises     Assessment/Plan    PT Assessment Patient needs continued PT services  PT Problem List Decreased strength;Decreased coordination;Decreased range of motion;Decreased activity tolerance;Decreased balance;Decreased mobility;Decreased knowledge of use of DME       PT Treatment Interventions DME instruction;Therapeutic exercise;Gait training;Balance training;Stair training;Neuromuscular re-education;Functional mobility training;Therapeutic activities;Patient/family education    PT Goals (Current  goals can be found in the Care Plan section)  Acute Rehab PT Goals Patient Stated Goal: get better, go home PT Goal Formulation: With patient Time For Goal Achievement: 06/27/22 Potential to Achieve Goals: Good    Frequency 7X/week     Co-evaluation               AM-PAC PT "6 Clicks" Mobility  Outcome Measure Help needed turning from your back to your side while in a flat bed without using bedrails?: None Help needed moving from lying on your back to sitting on the side of a flat bed without using bedrails?: None Help needed moving to and from a bed to a chair (including a wheelchair)?: A Little Help needed standing up from a chair using your arms (e.g., wheelchair or bedside chair)?: A Little Help needed to walk in hospital room?: A Little Help needed climbing 3-5 steps with a railing? : A Little 6 Click Score: 20    End of Session   Activity Tolerance: Patient tolerated treatment well Patient left: in chair;with chair alarm set;with call bell/phone within reach Nurse Communication: Mobility status PT Visit Diagnosis: Unsteadiness on feet (R26.81);Muscle weakness (generalized) (M62.81);Hemiplegia and hemiparesis Hemiplegia - Right/Left: Right Hemiplegia - dominant/non-dominant: Dominant Hemiplegia - caused by: Cerebral infarction    Time: 6384-6659 PT Time Calculation (min) (  ACUTE ONLY): 15 min   Charges:   PT Evaluation $PT Eval Moderate Complexity: [place], PT, DPT 06/13/22, 9:23 AM   [name] 06/13/2022, 9:21 AM

## 2022-06-13 NOTE — Consult Note (Signed)
                    NEURO HOSPITALIST CONSULT NOTE   Requestig physician: Dr. Lama  Reason for Consult: New onset of right sided weakness and dysarthria.   History obtained from:  Patient and Chart     HPI:                                                                                                                                          Oscar [name] is an 80 y.o. male who presented to the ED on Friday afternoon with new onset of right arm numbness/weakness, RLE weakness and slurred speech that started at 0900. MRI brain revealed acute ischemic infarctions in the left internal capsule and left occipital lobe.   HPI from Hospitalist admission note has been reviewed: "Caucasian male with medical history significant for dyslipidemia, prediabetes, colon cancer status post colectomy, who presented to the emergency room with acute onset of right-sided upper and lower extremity weakness and slurred speech when he woke up.  He denied any headache or dizziness or blurred vision.  He admitted to vertigo without tinnitus and has been having mild ataxia.  No urinary or stool incontinence.  No witnessed seizures.  No fever or chills.  No dysuria, oliguria or hematuria or flank pain.  No chest pain or palpitations.  No cough or wheezing or dyspnea.  No head injuries or trauma. ED Course: Upon presentation to the emergency room, vital signs were within normal.  Labs revealed calcium of 8.4 and alk phos 156 with albumin of 3 and CBC showed leukopenia 3.1 anemia that is close to baseline.  Alcohol level was less than 10. EKG as reviewed by me : EKG showed sinus rhythm with a rate of 66 with PACs, right bundle branch block Q waves inferiorly. Imaging: Noncontrasted head CT scan revealed no acute intracranial normalities.  Brain MRI without contrast revealed small foci of acute/subacute ischemia in the left internal capsule posterior limb and left occipital lobe with no hemorrhage or mass. The patient was given 4  baby aspirin and oxycodone 5 mg.  He will be admitted to an observation medical telemetry bed for further evaluation and management."  On bedside assessment this evening, he is resting in bed comfortably watching the NAS[employer] race. He states that he continues to be weak on the right.   Past Medical History:  Diagnosis Date   Arthritis    Cancer (HCC)    colon cancer 02/2019 per pt    Family history of adverse reaction to anesthesia    cousin took all night to wake up from anesthesia   H/O colon cancer, stage IV    Hyperlipemia    Neuromuscular disorder (HCC)    Pre-diabetes     Past Surgical History:  Procedure Laterality Date   COLON SURGERY       ESOPHAGOGASTRODUODENOSCOPY (EGD) WITH PROPOFOL N/A 12/26/2020   Procedure: ESOPHAGOGASTRODUODENOSCOPY (EGD) WITH PROPOFOL;  Surgeon: Anna, Kiran, MD;  Location: [facility] ENDOSCOPY;  Service: Gastroenterology;  Laterality: N/A;   IR ANGIOGRAM SELECTIVE EACH ADDITIONAL VESSEL  12/19/2020   IR ANGIOGRAM SELECTIVE EACH ADDITIONAL VESSEL  03/24/2021   IR ANGIOGRAM SELECTIVE EACH ADDITIONAL VESSEL  03/24/2021   IR ANGIOGRAM SELECTIVE EACH ADDITIONAL VESSEL  04/04/2021   IR ANGIOGRAM SELECTIVE EACH ADDITIONAL VESSEL  04/04/2021   IR ANGIOGRAM SELECTIVE EACH ADDITIONAL VESSEL  04/04/2021   IR ANGIOGRAM VISCERAL SELECTIVE  12/19/2020   IR ANGIOGRAM VISCERAL SELECTIVE  03/24/2021   IR ANGIOGRAM VISCERAL SELECTIVE  04/04/2021   IR ANGIOGRAM VISCERAL SELECTIVE  04/04/2021   IR EMBO ARTERIAL NOT HEMORR HEMANG INC GUIDE ROADMAPPING  03/24/2021   IR EMBO TUMOR ORGAN ISCHEMIA INFARCT INC GUIDE ROADMAPPING  12/19/2020   IR EMBO TUMOR ORGAN ISCHEMIA INFARCT INC GUIDE ROADMAPPING  04/04/2021   IR IMAGING GUIDED PORT INSERTION  07/20/2019   IR RADIOLOGIST EVAL & MGMT  12/05/2020   IR RADIOLOGIST EVAL & MGMT  02/11/2021   IR RADIOLOGIST EVAL & MGMT  03/04/2021   IR RADIOLOGIST EVAL & MGMT  04/29/2021   IR RADIOLOGIST EVAL & MGMT  07/22/2021   IR RADIOLOGIST EVAL & MGMT  11/19/2021   IR US  GUIDE VASC ACCESS RIGHT  12/19/2020   IR US GUIDE VASC ACCESS RIGHT  03/24/2021   IR US GUIDE VASC ACCESS RIGHT  04/04/2021   JOINT REPLACEMENT Left 2010   Knee   RADIOLOGY WITH ANESTHESIA N/A 01/15/2021   Procedure: CT WITH ANESTHESIA MICROWAVE ABLATION OF LIVER;  Surgeon: McCullough, Heath K, MD;  Location: [facility] ORS;  Service: Anesthesiology;  Laterality: N/A;    Family History  Problem Relation Age of Onset   Peptic Ulcer Disease Father              Social History:  reports that he has been smoking cigarettes. He has a 3.75 pack-year smoking history. He has never used smokeless tobacco. He reports current alcohol use. He reports that he does not use drugs.  Allergies  Allergen Reactions   Ace Inhibitors Swelling    MEDICATIONS:                                                                                                                     Prior to Admission:  Medications Prior to Admission  Medication Sig Dispense Refill Last Dose   oxyCODONE (OXY IR/ROXICODONE) 5 MG immediate release tablet Take 1 tablet (5 mg total) by mouth every 8 (eight) hours as needed for severe pain. 60 tablet 0 06/11/2022   amLODipine (NORVASC) 5 MG tablet Take 5 mg by mouth daily. (Patient not taking: Reported on 10/03/2021)   Not Taking   diphenoxylate-atropine (LOMOTIL) 2.5-0.025 MG tablet Take 1 tablet by mouth 4 (four) times daily as needed for diarrhea or loose stools. Take it along with immodium (Patient not taking: Reported on 02/23/2022) 60 tablet 0 Not Taking     ondansetron (ZOFRAN) 8 MG tablet Take 1 tablet (8 mg total) by mouth every 8 (eight) hours as needed for nausea or vomiting. (Patient not taking: Reported on 03/09/2022) 30 tablet 0 Not Taking   polyethylene glycol powder (GLYCOLAX/MIRALAX) 17 GM/SCOOP powder Take by mouth as needed. (Patient not taking: Reported on 12/29/2021)   Not Taking   pravastatin (PRAVACHOL) 40 MG tablet Take 40 mg by mouth daily.  (Patient not taking: Reported on 06/09/2021)    Not Taking   predniSONE (DELTASONE) 20 MG tablet Take 1 tablet (20 mg total) by mouth daily with breakfast. (Patient not taking: Reported on 02/06/2022) 30 tablet 1 Not Taking   prochlorperazine (COMPAZINE) 10 MG tablet Take 1 tablet (10 mg total) by mouth every 6 (six) hours as needed for nausea or vomiting. (Patient not taking: Reported on 03/09/2022) 60 tablet 0 Not Taking   regorafenib (STIVARGA) 40 MG tablet Take 2 tablets (80 mg total) by mouth daily with breakfast. Take for 21 days, then hold for 7 days. Repeat every 28 days. (Patient not taking: Reported on 02/06/2022) 42 tablet 1 Not Taking   senna (SENOKOT) 8.6 MG TABS tablet Take 1 tablet (8.6 mg total) by mouth daily. (Patient not taking: Reported on 12/29/2021) 120 tablet 0 Not Taking   sucralfate (CARAFATE) 1 g tablet Take 1 tablet (1 g total) by mouth 4 (four) times daily -  with meals and at bedtime. (Patient not taking: Reported on 11/18/2021) 90 tablet 3 Not Taking   tamsulosin (FLOMAX) 0.4 MG CAPS capsule Take 1 capsule (0.4 mg total) by mouth daily. (Patient not taking: Reported on 04/06/2022) 90 capsule 1 Not Taking   Scheduled:  atorvastatin  20 mg Oral Daily   clopidogrel  75 mg Oral Daily   Continuous:   ROS:                                                                                                                                       As per HPI. Does not endorse any other symptoms this evening.    Blood pressure 126/76, pulse 67, temperature 97.9 F (36.6 C), resp. rate 17, height 6' (1.829 m), SpO2 99 %.   General Examination:                                                                                                       Physical Exam  HEENT-  [place]/AT   Lungs- Respirations unlabored Extremities- No edema  Neurological Examination Mental Status:   Awake and alert. Oriented x 5. Speech is dysarthric but fluent with intact comprehension and naming.  Cranial Nerves: II: Temporal visual fields are intact with  no extinction to DSS. PERRL.   III,IV, VI: EOMI. No ptosis. No nystagmus.  V: Temp sensation equal bilaterally VII: No facial droop or asymmetry with grimace or during speech VIII: Hearing intact to voice IX,X: No hypophonia or hoarseness XI: Head is midline XII: Midline tongue extension Motor: RUE 4/5 proximally and distally.  RLE 4/5 knee flexion and hip flexion. Knee extension 4+/5. Otherwise 5/5 LUE and LLE 5/5 No pronator drift.  Sensory: Temp and light touch intact throughout, bilaterally. No extinction to DSS.  Deep Tendon Reflexes: 1+ and symmetric brachioradialis and patellar bilaterally  Cerebellar: Mild ataxia with FNF on the right Gait: Deferred   Lab Results: Basic Metabolic Panel: Recent Labs  Lab 06/12/22 1656 06/13/22 0445  NA 140 138  K 4.2 3.9  CL 108 111  CO2 28 24  GLUCOSE 101* 93  BUN 13 12  CREATININE 0.75 0.68  CALCIUM 8.4* 7.9*    CBC: Recent Labs  Lab 06/12/22 1656 06/13/22 0445  WBC 3.1* 2.9*  NEUTROABS 1.8  --   HGB 10.6* 9.4*  HCT 33.5* 28.5*  MCV 100.3* 96.3  PLT 180 127*    Cardiac Enzymes: No results for input(s): "CKTOTAL", "CKMB", "CKMBINDEX", "TROPONINI" in the last 168 hours.  Lipid Panel: Recent Labs  Lab 06/13/22 0445  CHOL 126  TRIG 36  HDL 40*  CHOLHDL 3.2  VLDL 7  LDLCALC 79    Imaging: MR BRAIN WO CONTRAST  Result Date: 06/12/2022 CLINICAL DATA:  Right-sided weakness EXAM: MRI HEAD WITHOUT CONTRAST TECHNIQUE: Multiplanar, multiecho pulse sequences of the brain and surrounding structures were obtained without intravenous contrast. COMPARISON:  None Available. FINDINGS: Brain: There are small foci of acute/subacute ischemia the left internal capsule posterior limb and in the left occipital lobe. No acute or chronic hemorrhage. There is multifocal hyperintense T2-weighted signal within the white matter. Generalized volume loss. Old left basal ganglia small vessel infarct. The midline structures are normal.  Vascular: Major flow voids are preserved. Skull and upper cervical spine: Normal calvarium and skull base. Visualized upper cervical spine and soft tissues are normal. Sinuses/Orbits:No paranasal sinus fluid levels or advanced mucosal thickening. No mastoid or middle ear effusion. Normal orbits. IMPRESSION: Small foci of acute/subacute ischemia in the left internal capsule posterior limb and left occipital lobe. No hemorrhage or mass effect. Electronically Signed   By: Kevin  Herman M.D.   On: 06/12/2022 21:01   CT HEAD WO CONTRAST  Result Date: 06/12/2022 CLINICAL DATA:  Neurological deficit EXAM: CT HEAD WITHOUT CONTRAST TECHNIQUE: Contiguous axial images were obtained from the base of the skull through the vertex without intravenous contrast. RADIATION DOSE REDUCTION: This exam was performed according to the departmental dose-optimization program which includes automated exposure control, adjustment of the mA and/or kV according to patient size and/or use of iterative reconstruction technique. COMPARISON:  Head CT dated November 09, 2020 FINDINGS: Brain: Old lacunar infarcts of the bilateral basal ganglia. No evidence of acute infarction, hemorrhage, hydrocephalus, extra-axial collection or mass lesion/mass effect. Vascular: No hyperdense vessel or unexpected calcification. Skull: Normal. Negative for fracture or focal lesion. Sinuses/Orbits: No acute finding. Other: None. IMPRESSION: No acute intracranial abnormality. Electronically Signed   By: Leah  Strickland M.D.   On: 06/12/2022 17:46     Assessment: 80 year old male presenting with acute onset of right sided weakness -   Exam reveals dysarthria, right sided weakness and mild RUE dystaxia - MRI brain: Small foci of acute/subacute ischemia in the posterior limb of the left internal capsule and left occipital lobe. Also noted are multifocal hyperintense T2-weighted lesions within the white matter, significantly worse in the left frontal and parietal  regions subcortically. There is generalized volume loss. An old left basal ganglia small vessel infarct is noted. Major flow voids are preserved, but the left ICA appears smaller in caliber than the right. Images personally reviewed. - Carotid ultrasound: 50-69% stenosis of the right internal carotid artery. Greater than 70% stenosis of the left carotid artery. Further evaluation with CT angiography of the neck should be performed to better quantify plaque burden and degree of stenosis. - EKG: Sinus rhythm with Premature atrial complexes with Abberant conduction; Right bundle branch block; Inferior infarct , age undetermined; Abnormal ECG; When compared with ECG of 15-Jan-2021 06:58: Left anterior fascicular block is no longer Present; Inferior infarct is now Present; Inverted T waves have replaced nonspecific T wave abnormality in Inferior leads - Stroke risk factors: Cancer history, HLD, HTN and prediabetes  Recommendations: - Agree with starting Plavix and atorvastatin - BP management. Out of the permissive HTN time window.  - HgbA1c, fasting lipid panel - CTA of head and neck per Radiology recommendations - TTE - PT consult, OT consult, Speech consult - Risk factor modification - Telemetry monitoring - Frequent neuro checks - NPO until passes stroke swallow screen   Electronically signed: Dr. Eric Lindzen 06/13/2022, 8:31 AM      

## 2022-06-13 NOTE — Progress Notes (Signed)
[facility]  PROGRESS NOTE  Oscar [name] LOV:[id number] DOB: 16-Apr-1941 DOA: 06/12/2022 PCP: Sofie Hartigan, MD   Brief HPI:   81 year old Caucasian male with a history of dyslipidemia, prediabetes, colon cancer s/p colectomy came to ED with acute onset of right upper and lower extremity weakness and slurred speech after he woke up.  Noncontrast CT head showed no acute abnormality.  Brain MRI revealed small foci of acute/subacute ischemia in the left internal capsule posterior limb and left occipital lobe with no hemorrhage or mass.  Patient was given 4 baby aspirin and oxycodone. Neurology was consulted    Subjective   Patient seen and examined, continues to have right upper extremity weakness, though strength is improved a little bit as per patient.   Assessment/Plan:   CVA -MRI brain shows acute/subacute left internal capsule posterior limb and left occipital lobe infarction -Ordered carotid Doppler, 2D echo with bubble study = Started on aspirin Plavix -Neurology consulted -Continue atorvastatin 20 mg daily -Lipid profile showed LDL 79, HDL 40 -PT/OT evaluation  GERD -Carafate on hold  Dyslipidemia -Continue atorvastatin  Hypertension -Blood pressure is stable -Amlodipine on hold for permissive hypertension  BPH -Patient was supposed to take Flomax -However he was not taking it at home -We will continue to hold Flomax for permissive hypertension  Pancytopenia -Mild, noted to have mild pancytopenia today -Unclear etiology -Follow CBC in a.m.      Medications      stroke: early stages of recovery book   Does not apply Once   atorvastatin  20 mg Oral Daily   clopidogrel  75 mg Oral Daily   enoxaparin (LOVENOX) injection  40 mg Subcutaneous Q24H     Data Reviewed:   CBG:  Recent Labs  Lab 06/12/22 1641  GLUCAP 84    SpO2: 99 %    Vitals:   06/12/22 2255 06/12/22 2330 06/13/22 0439 06/13/22 0738  BP: (!) 143/79  133/79 126/76   Pulse: 72  63 67  Resp: '16  16 17  '$ Temp: 97.6 F (36.4 C)  97.8 F (36.6 C) 97.9 F (36.6 C)  TempSrc:      SpO2: 100%  98% 99%  Height:  6' (1.829 m)        Data Reviewed:  Basic Metabolic Panel: Recent Labs  Lab 06/12/22 1656 06/13/22 0445  NA 140 138  K 4.2 3.9  CL 108 111  CO2 28 24  GLUCOSE 101* 93  BUN 13 12  CREATININE 0.75 0.68  CALCIUM 8.4* 7.9*    CBC: Recent Labs  Lab 06/12/22 1656 06/13/22 0445  WBC 3.1* 2.9*  NEUTROABS 1.8  --   HGB 10.6* 9.4*  HCT 33.5* 28.5*  MCV 100.3* 96.3  PLT 180 127*    LFT Recent Labs  Lab 06/12/22 1656  AST 32  ALT 22  ALKPHOS 156*  BILITOT 0.9  PROT 7.1  ALBUMIN 3.0*     Antibiotics: Anti-infectives (From admission, onward)    None        DVT prophylaxis: Lovenox  Code Status: Full code  Family Communication: No family at bedside   CONSULTS neurology   Objective    Physical Examination:   General-appears in no acute distress Heart-S1-S2, regular, no murmur auscultated Lungs-clear to auscultation bilaterally, no wheezing or crackles auscultated Abdomen-soft, nontender, no organomegaly Extremities-no edema in the lower extremities Neuro-alert, oriented x3, weakness of right upper extremity, strength 4/5 in right upper extremity and right lower extremity, dysmetria in right  upper extremity  Status is: Inpatient: Stroke           [place]   [facility] If 7PM-7AM, please contact night-coverage at [email or web address], Office  [phone]   06/13/2022, 8:46 AM  LOS: 0 days

## 2022-06-13 NOTE — Evaluation (Addendum)
Speech Language Pathology Evaluation Patient Details Name: Oscar [name] MRN: [id number] DOB: May 01, 1941 Today's Date: 06/13/2022 Time: 83-1455 SLP Time Calculation (min) (ACUTE ONLY): 60 min  Problem List:  Patient Active Problem List   Diagnosis Date Noted   Essential hypertension 06/13/2022   Dyslipidemia 06/13/2022   GERD (gastroesophageal reflux disease) 06/13/2022   Acute cerebral infarction ([place]) 06/12/2022   Genetic testing 05/19/2022   Duodenal ulcer 12/04/2020   Goals of care, counseling/discussion 07/13/2019   Cancer of right colon ([place]) 07/05/2019   Colon cancer metastasized to liver ([place]) 06/07/2019   Mass of multiple sites of liver 03/09/2019   Other specified diseases of intestine 03/09/2019   Primary osteoarthritis of both knees 03/09/2019   Tobacco abuse 03/09/2019   Acute phlegmonous appendicitis 03/04/2019   BPH (benign prostatic hyperplasia) 08/06/2014   Diabetes mellitus type 2, uncomplicated ([place]) 41/74/0814   Hyperlipidemia 08/06/2014   Hypertension 08/06/2014   Seasonal allergic rhinitis 08/06/2014   Past Medical History:  Past Medical History:  Diagnosis Date   Arthritis    Cancer ([place])    colon cancer 02/2019 per pt    Family history of adverse reaction to anesthesia    cousin took all night to wake up from anesthesia   H/O colon cancer, stage IV    Hyperlipemia    Neuromuscular disorder ([place])    Pre-diabetes    Past Surgical History:  Past Surgical History:  Procedure Laterality Date   COLON SURGERY     ESOPHAGOGASTRODUODENOSCOPY (EGD) WITH PROPOFOL N/A 12/26/2020   Procedure: ESOPHAGOGASTRODUODENOSCOPY (EGD) WITH PROPOFOL;  Surgeon: [name], MD;  Location: [facility] ENDOSCOPY;  Service: Gastroenterology;  Laterality: N/A;   IR ANGIOGRAM SELECTIVE EACH ADDITIONAL VESSEL  12/19/2020   IR ANGIOGRAM SELECTIVE EACH ADDITIONAL VESSEL  03/24/2021   IR ANGIOGRAM SELECTIVE EACH ADDITIONAL VESSEL  03/24/2021   IR ANGIOGRAM SELECTIVE EACH ADDITIONAL  VESSEL  04/04/2021   IR ANGIOGRAM SELECTIVE EACH ADDITIONAL VESSEL  04/04/2021   IR ANGIOGRAM SELECTIVE EACH ADDITIONAL VESSEL  04/04/2021   IR ANGIOGRAM VISCERAL SELECTIVE  12/19/2020   IR ANGIOGRAM VISCERAL SELECTIVE  03/24/2021   IR ANGIOGRAM VISCERAL SELECTIVE  04/04/2021   IR ANGIOGRAM VISCERAL SELECTIVE  04/04/2021   IR EMBO ARTERIAL NOT HEMORR HEMANG INC GUIDE ROADMAPPING  03/24/2021   IR EMBO TUMOR ORGAN ISCHEMIA INFARCT INC GUIDE ROADMAPPING  12/19/2020   IR EMBO TUMOR ORGAN ISCHEMIA INFARCT INC GUIDE ROADMAPPING  04/04/2021   IR IMAGING GUIDED PORT INSERTION  07/20/2019   IR RADIOLOGIST EVAL & MGMT  12/05/2020   IR RADIOLOGIST EVAL & MGMT  02/11/2021   IR RADIOLOGIST EVAL & MGMT  03/04/2021   IR RADIOLOGIST EVAL & MGMT  04/29/2021   IR RADIOLOGIST EVAL & MGMT  07/22/2021   IR RADIOLOGIST EVAL & MGMT  11/19/2021   IR US GUIDE VASC ACCESS RIGHT  12/19/2020   IR US GUIDE VASC ACCESS RIGHT  03/24/2021   IR US GUIDE VASC ACCESS RIGHT  04/04/2021   JOINT REPLACEMENT Left 2010   Knee   RADIOLOGY WITH ANESTHESIA N/A 01/15/2021   Procedure: CT WITH ANESTHESIA MICROWAVE ABLATION OF LIVER;  Surgeon: Criselda Peaches, MD;  Location: [facility] ORS;  Service: Anesthesiology;  Laterality: N/A;   HPI:  Pt is a 81 y/o M admitted on 06/12/22 after presenting to the ED with c/o acute onset of R sided UE & LE weakness & slurred speech. Brain MRI without contrast revealed small foci of acute/subacute ischemia in the left internal capsule posterior limb  and left occipital lobe with no hemorrhage or mass. PMH: neuromuscular dis., dyslipidemia, prediabetes, colon CA s/p colectomy.   Assessment / Plan / Recommendation Clinical Impression   Pt seen for informal assessment of speech-language skills at bedside today. Pt presented to the ED w/ c/o "slurred speech" prior to admit. MRI noted: "small foci of acute/subacute ischemia in the left internal capsule posterior limb and left occipital lobe with no hemorrhage or mass". Pt is  communicating in general conversation in room w/ Staff and family stating that he still "hears a little slurredness in my speech".  Pt does wear U/L Dentures - min loose. He also endorsed he had "speech therapy when I was a child".   Pt appears to present w/ mild Dysarthria impacting his verbal communication at the word-conversation levels. When pt utilized strategies of slowing rate of speech, emphasizing speech sounds, and supporting words w/ full breath support (getting Louder), pt's Dysarthria lessened and articulation of speech improved. Education and practice of articulation strategies was completed w/ both pt and education given to Brothers present in room. Encouraged focus on enunciation and over-articulation w/ Big, Loud speech; identified need to replenish breath support to complete the sentence/task w/ good articulation and clarity. Pt followed the strategies well, good carry-over during the session.  Pt presented w/ No overt lingual and labial oral motor weakness during isolated tasks of lingual protrusion/posterior and lateral reach/strength. The decreased Right labial-facial weakness at admit was not overtly seen this session.   No apparent expressive or receptive aphasia noted, no cognitive deficits noted.   Discussed Articulation and Motor Speech strategies/exs using reading tasks(menu items, headlines) w/ pt/brothers present; practiced together and gave Handouts. Recommend pt f/u post return home w/ Home Health skilled ST servcies to continue working on the Dysarthria through exercises and strategies to improve verbal communication in ADLs per pt request. CM/NSG/MD updated. Pt agreed.     SLP Assessment  SLP Recommendation/Assessment: All further Speech Lanaguage Pathology  needs can be addressed in the next venue of care SLP Visit Diagnosis: Dysarthria and anarthria (R47.1) (mild)    Recommendations for follow up therapy are one component of a multi-disciplinary discharge planning  process, led by the attending physician.  Recommendations may be updated based on patient status, additional functional criteria and insurance authorization.    Follow Up Recommendations  Home health SLP (TBD)    Assistance Recommended at Discharge  PRN  Functional Status Assessment Patient has had a recent decline in their functional status and demonstrates the ability to make significant improvements in function in a reasonable and predictable amount of time.  Frequency and Duration  (n/a)   (n/a)      SLP Evaluation Cognition  Overall Cognitive Status: Within Functional Limits for tasks assessed Arousal/Alertness: Awake/alert Orientation Level: Oriented X4 Year: 2023 Month: August Attention: Focused;Sustained Focused Attention: Appears intact Sustained Attention: Appears intact Memory: Appears intact Awareness: Appears intact Problem Solving: Appears intact Executive Function: Reasoning;Sequencing Reasoning: Appears intact Sequencing: Appears intact Behaviors:  (none) Safety/Judgment: Appears intact       Comprehension  Auditory Comprehension Overall Auditory Comprehension: Appears within functional limits for tasks assessed Yes/No Questions: Within Functional Limits Commands: Within Functional Limits Conversation: Complex Interfering Components:  (none) Visual Recognition/Discrimination Discrimination: Not tested Reading Comprehension Reading Status: Not tested    Expression Expression Primary Mode of Expression: Verbal Verbal Expression Overall Verbal Expression: Appears within functional limits for tasks assessed Initiation: No impairment Automatic Speech:  (wfl) Level of Generative/Spontaneous Verbalization: Conversation Repetition: No  impairment Naming: No impairment Pragmatics: No impairment Interfering Components:  (none) Non-Verbal Means of Communication: Not applicable Written Expression Dominant Hand: Right Written Expression: Not tested   Oral  / Motor  Oral Motor/Sensory Function Overall Oral Motor/Sensory Function: Within functional limits (no unilateral oral weakness noted; no gross R facial weakness noted) Motor Speech Overall Motor Speech: Impaired (mild) Respiration: Within functional limits Phonation: Normal Resonance: Within functional limits Articulation: Impaired Level of Impairment: Conversation Intelligibility: Intelligibility reduced (slightly) Word: 75-100% accurate Phrase: 75-100% accurate Sentence: 75-100% accurate Conversation: 75-100% accurate (brothers understood him) Motor Planning: Witnin functional limits Motor Speech Errors: Not applicable Interfering Components:  (wears Dentures) Effective Techniques: Slow rate;Increased vocal intensity;Over-articulate               Orinda Kenner, MS, CCC-SLP Speech Language Pathologist Rehab Services; [place] [phone] (ascom) Gabor Lusk 06/13/2022, 3:43 PM

## 2022-06-13 NOTE — Assessment & Plan Note (Signed)
-   We will continue statin therapy. 

## 2022-06-13 NOTE — Assessment & Plan Note (Signed)
-   We will continue Flomax 

## 2022-06-13 NOTE — Evaluation (Signed)
Occupational Therapy Evaluation Patient Details Name: Oscar [name] MRN: [id number] DOB: 1941-02-08 Today's Date: 06/13/2022   History of Present Illness Oscar [name] is a 81 y.o. Caucasian male with medical history significant for dyslipidemia, prediabetes, colon cancer status post colectomy, who presented to the emergency room with acute onset of right-sided upper and lower extremity weakness and slurred speech when he woke up. 06/12/22 Brain MRI: small foci of acute/subacute ischemia in the left internal capsule posterior limb and left occipital lobe   Clinical Impression   Oscar [name] was seen for OT evaluation this date. Prior to hospital admission, pt was Independent for mobility and ADLs. Pt lives with spouse who pt is caregiver for and daughter/grandchildren. Pt presents to acute OT demonstrating impaired ADL performance and functional mobility 2/2 impaired functional use of dominant RUE, decreased activity tolerance, and functional strength/ROM/balance deficits.   Pt currently requires MIN A for toilet t/f no AD use, pt notably reaching for furniture/walls for support. SBA pericare in sitting. SUPERVISION don/doff R sock, increased time and use of L hand only to complete. MIN A pull briefs up in standing - assist for balance. MIN A + rail use for ~40 ft ADL t/f, improved to CGA with RW use. Pt would benefit from skilled OT to address noted impairments and functional limitations (see below for any additional details). Upon hospital discharge, recommend HHOT to maximize pt safety and return to PLOF     Recommendations for follow up therapy are one component of a multi-disciplinary discharge planning process, led by the attending physician.  Recommendations may be updated based on patient status, additional functional criteria and insurance authorization.   Follow Up Recommendations  Home health OT    Assistance Recommended at Discharge Frequent or constant Supervision/Assistance   Patient can return home with the following A little help with walking and/or transfers;A little help with bathing/dressing/bathroom;Help with stairs or ramp for entrance    Functional Status Assessment  Patient has had a recent decline in their functional status and demonstrates the ability to make significant improvements in function in a reasonable and predictable amount of time.  Equipment Recommendations  BSC/3in1;Other (comment) (2WW)    Recommendations for Other Services       Precautions / Restrictions Precautions Precautions: Fall Restrictions Weight Bearing Restrictions: No      Mobility Bed Mobility Overal bed mobility: Needs Assistance Bed Mobility: Supine to Sit     Supine to sit: Supervision          Transfers Overall transfer level: Needs assistance Equipment used: None Transfers: Sit to/from Stand Sit to Stand: Min guard           General transfer comment: use of rail, improved with RW use to SBA      Balance Overall balance assessment: Needs assistance Sitting-balance support: No upper extremity supported, Feet supported Sitting balance-Leahy Scale: Fair     Standing balance support: No upper extremity supported, During functional activity Standing balance-Leahy Scale: Fair                             ADL either performed or assessed with clinical judgement   ADL Overall ADL's : Needs assistance/impaired                                       General ADL Comments: MIN A for toilet  t/f no AD use, pt notably reaching for furniture/walls for support. SBA pericare in sitting. SUPERVISION don/doff R sock, increased time and use of L hand only to complete. MIN A pull briefs up in standing - assist for balance. MIN A + rail use for ~40 ft ADL t/f      Pertinent Vitals/Pain Pain Assessment Pain Assessment: No/denies pain     Hand Dominance Right   Extremity/Trunk Assessment Upper Extremity Assessment Upper  Extremity Assessment: RUE deficits/detail RUE Deficits / Details: grossly 4/5, decreased [facility]   Lower Extremity Assessment Lower Extremity Assessment: Defer to PT evaluation       Communication Communication Communication: Expressive difficulties (mild aphasia)   Cognition Arousal/Alertness: Awake/alert Behavior During Therapy: WFL for tasks assessed/performed Overall Cognitive Status: Within Functional Limits for tasks assessed                                        Home Living Family/patient expects to be discharged to:: Private residence Living Arrangements: Spouse/significant other;Children Available Help at Discharge: Family;Available 24 hours/day Type of Home: House       Home Layout: Multi-level;Able to live on main level with bedroom/bathroom     Bathroom Shower/Tub: Tub/shower unit             Additional Comments: lives with wife, step daughter, son in Sports coach, and grandchildren ages 12-25      Prior Functioning/Environment Prior Level of Function : Independent/Modified Independent             Mobility Comments: 1 fall in last 3 months ADLs Comments: recently quit driving and golfing 2/2 weakness        OT Problem List: Decreased strength;Decreased activity tolerance;Impaired balance (sitting and/or standing);Decreased safety awareness;Impaired UE functional use      OT Treatment/Interventions: Self-care/ADL training;Therapeutic exercise;Energy conservation;DME and/or AE instruction;Neuromuscular education;Therapeutic activities;Patient/family education;Balance training    OT Goals(Current goals can be found in the care plan section) Acute Rehab OT Goals Patient Stated Goal: to return to PLOF OT Goal Formulation: With patient Time For Goal Achievement: 06/27/22 Potential to Achieve Goals: Good ADL Goals Pt Will Perform Grooming: with modified independence;standing Pt Will Perform Lower Body Dressing: with modified independence;sit  to/from stand Pt Will Transfer to Toilet: Independently;ambulating;regular height toilet Pt/caregiver will Perform Home Exercise Program: Increased ROM;Increased strength;Right Upper extremity  OT Frequency: Min 5X/week    Co-evaluation              AM-PAC OT "6 Clicks" Daily Activity     Outcome Measure Help from another person eating meals?: A Little Help from another person taking care of personal grooming?: A Little Help from another person toileting, which includes using toliet, bedpan, or urinal?: A Little Help from another person bathing (including washing, rinsing, drying)?: A Little Help from another person to put on and taking off regular upper body clothing?: A Little Help from another person to put on and taking off regular lower body clothing?: A Little 6 Click Score: 18   End of Session Equipment Utilized During Treatment: Gait belt;Rolling walker (2 wheels) Nurse Communication: Mobility status (nose bleed and bloody stool)  Activity Tolerance: Patient tolerated treatment well Patient left: in chair;with call bell/phone within reach;with chair alarm set  OT Visit Diagnosis: Unsteadiness on feet (R26.81);Muscle weakness (generalized) (M62.81);Hemiplegia and hemiparesis Hemiplegia - Right/Left: Right Hemiplegia - dominant/non-dominant: Dominant Hemiplegia - caused by: Cerebral infarction  Time: 8251-8984 OT Time Calculation (min): 25 min Charges:  OT General Charges $OT Visit: 1 Visit OT Evaluation $OT Eval Moderate Complexity: 1 Mod OT Treatments $Self Care/Home Management : 8-22 mins  Dessie Coma, M.S. OTR/L  06/13/22, 9:35 AM  ascom [phone]

## 2022-06-14 ENCOUNTER — Observation Stay: Payer: Medicare HMO

## 2022-06-14 ENCOUNTER — Observation Stay
Admit: 2022-06-14 | Discharge: 2022-06-14 | Disposition: A | Discharge status: Home / Self Care | Payer: Medicare HMO | Attending: Family Medicine | Admitting: Family Medicine

## 2022-06-14 DIAGNOSIS — F17210 Nicotine dependence, cigarettes, uncomplicated: Secondary | ICD-10-CM | POA: Diagnosis present

## 2022-06-14 DIAGNOSIS — Z79899 Other long term (current) drug therapy: Secondary | ICD-10-CM | POA: Diagnosis not present

## 2022-06-14 DIAGNOSIS — R270 Ataxia, unspecified: Secondary | ICD-10-CM | POA: Diagnosis present

## 2022-06-14 DIAGNOSIS — E785 Hyperlipidemia, unspecified: Secondary | ICD-10-CM | POA: Diagnosis present

## 2022-06-14 DIAGNOSIS — I639 Cerebral infarction, unspecified: Secondary | ICD-10-CM | POA: Diagnosis present

## 2022-06-14 DIAGNOSIS — K219 Gastro-esophageal reflux disease without esophagitis: Secondary | ICD-10-CM | POA: Diagnosis present

## 2022-06-14 DIAGNOSIS — R7303 Prediabetes: Secondary | ICD-10-CM | POA: Diagnosis present

## 2022-06-14 DIAGNOSIS — I4510 Unspecified right bundle-branch block: Secondary | ICD-10-CM | POA: Diagnosis present

## 2022-06-14 DIAGNOSIS — C189 Malignant neoplasm of colon, unspecified: Secondary | ICD-10-CM | POA: Diagnosis present

## 2022-06-14 DIAGNOSIS — D61818 Other pancytopenia: Secondary | ICD-10-CM | POA: Diagnosis not present

## 2022-06-14 DIAGNOSIS — I6502 Occlusion and stenosis of left vertebral artery: Secondary | ICD-10-CM | POA: Diagnosis not present

## 2022-06-14 DIAGNOSIS — I6523 Occlusion and stenosis of bilateral carotid arteries: Secondary | ICD-10-CM | POA: Diagnosis not present

## 2022-06-14 DIAGNOSIS — Z888 Allergy status to other drugs, medicaments and biological substances status: Secondary | ICD-10-CM | POA: Diagnosis not present

## 2022-06-14 DIAGNOSIS — N400 Enlarged prostate without lower urinary tract symptoms: Secondary | ICD-10-CM | POA: Diagnosis present

## 2022-06-14 DIAGNOSIS — I771 Stricture of artery: Secondary | ICD-10-CM | POA: Diagnosis not present

## 2022-06-14 DIAGNOSIS — I10 Essential (primary) hypertension: Secondary | ICD-10-CM | POA: Diagnosis present

## 2022-06-14 DIAGNOSIS — I69351 Hemiplegia and hemiparesis following cerebral infarction affecting right dominant side: Secondary | ICD-10-CM | POA: Diagnosis not present

## 2022-06-14 DIAGNOSIS — I63512 Cerebral infarction due to unspecified occlusion or stenosis of left middle cerebral artery: Secondary | ICD-10-CM | POA: Diagnosis present

## 2022-06-14 DIAGNOSIS — C799 Secondary malignant neoplasm of unspecified site: Secondary | ICD-10-CM | POA: Diagnosis present

## 2022-06-14 DIAGNOSIS — Z9049 Acquired absence of other specified parts of digestive tract: Secondary | ICD-10-CM | POA: Diagnosis not present

## 2022-06-14 DIAGNOSIS — R471 Dysarthria and anarthria: Secondary | ICD-10-CM | POA: Diagnosis present

## 2022-06-14 LAB — CBC
HCT: 28 % — ABNORMAL LOW (ref 39.0–52.0)
Hemoglobin: 9.2 g/dL — ABNORMAL LOW (ref 13.0–17.0)
MCH: 31.8 pg (ref 26.0–34.0)
MCHC: 32.9 g/dL (ref 30.0–36.0)
MCV: 96.9 fL (ref 80.0–100.0)
Platelets: 126 10*3/uL — ABNORMAL LOW (ref 150–400)
RBC: 2.89 MIL/uL — ABNORMAL LOW (ref 4.22–5.81)
RDW: 16 % — ABNORMAL HIGH (ref 11.5–15.5)
WBC: 3 10*3/uL — ABNORMAL LOW (ref 4.0–10.5)
nRBC: 0 % (ref 0.0–0.2)

## 2022-06-14 LAB — ECHOCARDIOGRAM COMPLETE BUBBLE STUDY
Area-P 1/2: 1.48 cm2
S' Lateral: 2.76 cm

## 2022-06-14 LAB — BASIC METABOLIC PANEL
Anion gap: 3 — ABNORMAL LOW (ref 5–15)
BUN: 12 mg/dL (ref 8–23)
CO2: 25 mmol/L (ref 22–32)
Calcium: 7.9 mg/dL — ABNORMAL LOW (ref 8.9–10.3)
Chloride: 112 mmol/L — ABNORMAL HIGH (ref 98–111)
Creatinine, Ser: 0.72 mg/dL (ref 0.61–1.24)
GFR, Estimated: >60 mL/min (ref >60)
Glucose, Bld: 91 mg/dL (ref 70–99)
Potassium: 3.7 mmol/L (ref 3.5–5.1)
Sodium: 140 mmol/L (ref 135–145)

## 2022-06-14 LAB — HEPARIN LEVEL (UNFRACTIONATED): Heparin Unfractionated: 0.16 IU/mL — ABNORMAL LOW (ref 0.30–0.70)

## 2022-06-14 LAB — PROTIME-INR
INR: 1.2 (ref 0.8–1.2)
Prothrombin Time: 15.5 seconds — ABNORMAL HIGH (ref 11.4–15.2)

## 2022-06-14 LAB — HEMOGLOBIN AND HEMATOCRIT, BLOOD
HCT: 33.6 % — ABNORMAL LOW (ref 39.0–52.0)
HCT: 31.1 % — ABNORMAL LOW (ref 39.0–52.0)
HCT: 26.2 % — ABNORMAL LOW (ref 39.0–52.0)
Hemoglobin: 11 g/dL — ABNORMAL LOW (ref 13.0–17.0)
Hemoglobin: 10.2 g/dL — ABNORMAL LOW (ref 13.0–17.0)
Hemoglobin: 8.7 g/dL — ABNORMAL LOW (ref 13.0–17.0)

## 2022-06-14 LAB — OCCULT BLOOD X 1 CARD TO LAB, STOOL: Fecal Occult Bld: POSITIVE — AB

## 2022-06-14 MED ORDER — IOHEXOL 350 MG/ML SOLN
100.0000 mL | Freq: Once | INTRAVENOUS | Status: AC | PRN
Start: 2022-06-14 — End: 2022-06-14
  Administered 2022-06-14: 100 mL via INTRAVENOUS

## 2022-06-14 MED ORDER — SODIUM CHLORIDE 0.9% FLUSH: 10.0000 mL | INTRAVENOUS | Status: DC | PRN

## 2022-06-14 MED ORDER — AMLODIPINE BESYLATE 5 MG PO TABS
5.0000 mg | ORAL_TABLET | Freq: Every day | ORAL | Status: DC
  Administered 2022-06-14 – 2022-06-15 (×2): 5 mg via ORAL
  Filled 2022-06-14 (×2): qty 1

## 2022-06-14 MED ORDER — CHLORHEXIDINE GLUCONATE CLOTH 2 % EX PADS
6.0000 | MEDICATED_PAD | Freq: Every day | CUTANEOUS | Status: DC
  Administered 2022-06-14 – 2022-06-15 (×2): 6 via TOPICAL

## 2022-06-14 MED ORDER — HEPARIN (PORCINE) 25000 UT/250ML-% IV SOLN
1000.0000 [IU]/h | INTRAVENOUS | Status: DC
  Administered 2022-06-14: 850 [IU]/h via INTRAVENOUS
  Administered 2022-06-15: 1000 [IU]/h via INTRAVENOUS
  Filled 2022-06-14 (×2): qty 250

## 2022-06-14 MED ORDER — CHLORHEXIDINE GLUCONATE CLOTH 2 % EX PADS: 6.0000 | MEDICATED_PAD | Freq: Every day | CUTANEOUS | Status: DC

## 2022-06-14 NOTE — Consult Note (Signed)
[place] for IV Heparin Indication:  Carotid artery stenosis /  acute CVA  Patient Measurements: Height: 6' (182.9 cm) IBW/kg (Calculated) : 77.6 Heparin Dosing Weight: 69.1 kg  Labs: Recent Labs    06/12/22 1656 06/13/22 0445 06/14/22 0506  HGB 10.6* 9.4* 9.2*  HCT 33.5* 28.5* 28.0*  PLT 180 127* 126*  APTT 31  --   --   LABPROT 15.1  --   --   INR 1.2  --   --   CREATININE 0.75 0.68 0.72    Estimated Creatinine Clearance: 74.4 mL/min (by C-G formula based on SCr of 0.72 mg/dL).   Medications:  No anticoagulation or antiplatelets prior to admission per my chart review  Assessment: Patient is a 81 y/o M with medical history including HLD, prediabetes, colon cancer s/p colectomy who presented to the ED with acute right upper and lower extremity weakness and slurred speech. He was found to have acute CVA. Pharmacy consulted to initiate and manage heparin infusion per stroke protocol.    Goal of Therapy:  Heparin level 0.3 - 0.5 units/ml Monitor platelets by anticoagulation protocol: Yes   Plan:  --Start heparin infusion at 850 units/hr --Heparin level 8 hours after initiation of infusion --Daily CBC per protocol while on IV heparin  Benita Gutter 06/14/2022,11:25 AM

## 2022-06-14 NOTE — Progress Notes (Addendum)
Subjective: No complaints. Lying comfortably in bed, watching a golf tournament on TV.   Objective: Current vital signs: BP (!) 153/83 (BP Location: Left Arm)   Pulse 64   Temp (!) 97.4 F (36.3 C)   Resp 16   Ht 6' (1.829 m)   SpO2 100%   BMI 20.67 kg/m  Vital signs in last 24 hours: Temp:  [97.4 F (36.3 C)-98.3 F (36.8 C)] 97.4 F (36.3 C) (08/27 0740) Pulse Rate:  [64-72] 64 (08/27 0740) Resp:  [16-20] 16 (08/27 0740) BP: (119-153)/(68-83) 153/83 (08/27 0740) SpO2:  [99 %-100 %] 100 % (08/27 0740)  Intake/Output from previous day: 08/26 0701 - 08/27 0700 In: 670.3 [P.O.:240; I.V.:430.3] Out: -  Intake/Output this shift: No intake/output data recorded. Nutritional status:  Diet Order             Diet Heart Room service appropriate? Yes; Fluid consistency: Thin  Diet effective now                  Physical Exam  HEENT-  [place]/AT   Lungs- Respirations unlabored Extremities- No edema   Neurological Examination Mental Status: Awake, alert and fully oriented. Dysarthria still present, but improved since yesterday.   Cranial Nerves: II, III,IV, VI: Fixates and tracks normally.   VII: No facial droop or asymmetry noted VIII: Hearing intact to voice IX,X: No hypophonia or hoarseness XI: Head is midline Motor: RUE 4+/5 proximally and distally. Improved since yesterday RLE Mild weakness, improved since yesterday LUE and LLE 5/5 Cerebellar: No gross ataxia noted.  Gait: Deferred  Lab Results: Results for orders placed or performed during the hospital encounter of 06/12/22 (from the past 48 hour(s))  CBG monitoring, ED     Status: None   Collection Time: 06/12/22  4:41 PM  Result Value Ref Range   Glucose-Capillary 84 70 - 99 mg/dL    Comment: Glucose reference range applies only to samples taken after fasting for at least 8 hours.  Protime-INR     Status: None   Collection Time: 06/12/22  4:56 PM  Result Value Ref Range   Prothrombin Time 15.1 11.4 - 15.2  seconds   INR 1.2 0.8 - 1.2    Comment: (NOTE) INR goal varies based on device and disease states. Performed at [facility], [address] [place], [place] [address]   APTT     Status: None   Collection Time: 06/12/22  4:56 PM  Result Value Ref Range   aPTT 31 24 - 36 seconds    Comment: Performed at [facility], [address] [place], [place] [address]  CBC     Status: Abnormal   Collection Time: 06/12/22  4:56 PM  Result Value Ref Range   WBC 3.1 (L) 4.0 - 10.5 K/uL   RBC 3.34 (L) 4.22 - 5.81 MIL/uL   Hemoglobin 10.6 (L) 13.0 - 17.0 g/dL   HCT 33.5 (L) 39.0 - 52.0 %   MCV 100.3 (H) 80.0 - 100.0 fL   MCH 31.7 26.0 - 34.0 pg   MCHC 31.6 30.0 - 36.0 g/dL   RDW 16.2 (H) 11.5 - 15.5 %   Platelets 180 150 - 400 K/uL   nRBC 0.0 0.0 - 0.2 %    Comment: Performed at [facility], [address] [place], [place] [address]  Differential     Status: None   Collection Time: 06/12/22  4:56 PM  Result Value Ref Range   Neutrophils Relative % 60 %  Neutro Abs 1.8 1.7 - 7.7 K/uL   Lymphocytes Relative 21 %   Lymphs Abs 0.7 0.7 - 4.0 K/uL   Monocytes Relative 13 %   Monocytes Absolute 0.4 0.1 - 1.0 K/uL   Eosinophils Relative 5 %   Eosinophils Absolute 0.2 0.0 - 0.5 K/uL   Basophils Relative 1 %   Basophils Absolute 0.0 0.0 - 0.1 K/uL   Immature Granulocytes 0 %   Abs Immature Granulocytes 0.01 0.00 - 0.07 K/uL    Comment: Performed at [facility], [address] [place], [place] [address]  Comprehensive metabolic panel     Status: Abnormal   Collection Time: 06/12/22  4:56 PM  Result Value Ref Range   Sodium 140 135 - 145 mmol/L   Potassium 4.2 3.5 - 5.1 mmol/L   Chloride 108 98 - 111 mmol/L   CO2 28 22 - 32 mmol/L   Glucose, Bld 101 (H) 70 - 99 mg/dL    Comment: Glucose reference range applies only to samples taken after fasting for at least 8 hours.   BUN 13 8 - 23 mg/dL   Creatinine, Ser 0.75 0.61 - 1.24 mg/dL   Calcium 8.4  (L) 8.9 - 10.3 mg/dL   Total Protein 7.1 6.5 - 8.1 g/dL   Albumin 3.0 (L) 3.5 - 5.0 g/dL   AST 32 15 - 41 U/L   ALT 22 0 - 44 U/L   Alkaline Phosphatase 156 (H) 38 - 126 U/L   Total Bilirubin 0.9 0.3 - 1.2 mg/dL   GFR, Estimated >60 >60 mL/min    Comment: (NOTE) Calculated using the CKD-EPI Creatinine Equation (2021)    Anion gap 4 (L) 5 - 15    Comment: Performed at [facility], [address] [place], [place] [address]  Ethanol     Status: None   Collection Time: 06/12/22  4:56 PM  Result Value Ref Range   Alcohol, Ethyl (B) <10 <10 mg/dL    Comment: (NOTE) Lowest detectable limit for serum alcohol is 10 mg/dL.  For medical purposes only. Performed at [facility], [address] [place], [place] [address]   Hemoglobin A1c     Status: None   Collection Time: 06/12/22  4:56 PM  Result Value Ref Range   Hgb A1c MFr Bld 5.4 4.8 - 5.6 %    Comment: (NOTE) Pre diabetes:          5.7%-6.4%  Diabetes:              >6.4%  Glycemic control for   <7.0% adults with diabetes    Mean Plasma Glucose 108.28 mg/dL    Comment: Performed at [facility] [address], [place], [place] [address]  Lipid panel     Status: Abnormal   Collection Time: 06/13/22  4:45 AM  Result Value Ref Range   Cholesterol 126 0 - 200 mg/dL   Triglycerides 36 <150 mg/dL   HDL 40 (L) >40 mg/dL   Total CHOL/HDL Ratio 3.2 RATIO   VLDL 7 0 - 40 mg/dL   LDL Cholesterol 79 0 - 99 mg/dL    Comment:        Total Cholesterol/HDL:CHD Risk Coronary Heart Disease Risk Table                     Men   Women  1/2 Average Risk   3.4   3.3  Average Risk       5.0  4.4  2 X Average Risk   9.6   7.1  3 X Average Risk  23.4   11.0        Use the calculated Patient Ratio above and the CHD Risk Table to determine the patient's CHD Risk.        ATP III CLASSIFICATION (LDL):  <100     mg/dL   Optimal  100-129  mg/dL   Near or Above                    Optimal  130-159  mg/dL    Borderline  160-189  mg/dL   High  >190     mg/dL   Very High Performed at [facility], [address] [place], [place] [address]   Basic metabolic panel     Status: Abnormal   Collection Time: 06/13/22  4:45 AM  Result Value Ref Range   Sodium 138 135 - 145 mmol/L   Potassium 3.9 3.5 - 5.1 mmol/L   Chloride 111 98 - 111 mmol/L   CO2 24 22 - 32 mmol/L   Glucose, Bld 93 70 - 99 mg/dL    Comment: Glucose reference range applies only to samples taken after fasting for at least 8 hours.   BUN 12 8 - 23 mg/dL   Creatinine, Ser 0.68 0.61 - 1.24 mg/dL   Calcium 7.9 (L) 8.9 - 10.3 mg/dL   GFR, Estimated >60 >60 mL/min    Comment: (NOTE) Calculated using the CKD-EPI Creatinine Equation (2021)    Anion gap 3 (L) 5 - 15    Comment: Performed at [facility], [address] [place], [place] [address]  CBC     Status: Abnormal   Collection Time: 06/13/22  4:45 AM  Result Value Ref Range   WBC 2.9 (L) 4.0 - 10.5 K/uL   RBC 2.96 (L) 4.22 - 5.81 MIL/uL   Hemoglobin 9.4 (L) 13.0 - 17.0 g/dL   HCT 28.5 (L) 39.0 - 52.0 %   MCV 96.3 80.0 - 100.0 fL   MCH 31.8 26.0 - 34.0 pg   MCHC 33.0 30.0 - 36.0 g/dL   RDW 16.4 (H) 11.5 - 15.5 %   Platelets 127 (L) 150 - 400 K/uL   nRBC 0.0 0.0 - 0.2 %    Comment: Performed at [facility], [address] [place], [place] [address]  CBC     Status: Abnormal   Collection Time: 06/14/22  5:06 AM  Result Value Ref Range   WBC 3.0 (L) 4.0 - 10.5 K/uL   RBC 2.89 (L) 4.22 - 5.81 MIL/uL   Hemoglobin 9.2 (L) 13.0 - 17.0 g/dL   HCT 28.0 (L) 39.0 - 52.0 %   MCV 96.9 80.0 - 100.0 fL   MCH 31.8 26.0 - 34.0 pg   MCHC 32.9 30.0 - 36.0 g/dL   RDW 16.0 (H) 11.5 - 15.5 %   Platelets 126 (L) 150 - 400 K/uL   nRBC 0.0 0.0 - 0.2 %    Comment: Performed at [facility], [address], [place], [place] [address]  Basic metabolic panel     Status: Abnormal   Collection Time: 06/14/22  5:06 AM  Result Value Ref Range   Sodium  140 135 - 145 mmol/L   Potassium 3.7 3.5 - 5.1 mmol/L   Chloride 112 (H) 98 - 111 mmol/L   CO2 25 22 - 32 mmol/L   Glucose, Bld 91 70 - 99  mg/dL    Comment: Glucose reference range applies only to samples taken after fasting for at least 8 hours.   BUN 12 8 - 23 mg/dL   Creatinine, Ser 0.72 0.61 - 1.24 mg/dL   Calcium 7.9 (L) 8.9 - 10.3 mg/dL   GFR, Estimated >60 >60 mL/min    Comment: (NOTE) Calculated using the CKD-EPI Creatinine Equation (2021)    Anion gap 3 (L) 5 - 15    Comment: Performed at [facility], [address] [place], [place] [address]    No results found for this or any previous visit (from the past 240 hour(s)).  Lipid Panel Recent Labs    06/13/22 0445  CHOL 126  TRIG 36  HDL 40*  CHOLHDL 3.2  VLDL 7  LDLCALC 79    Studies/Results: CT ANGIO NECK W OR WO CONTRAST  Addendum Date: 06/14/2022   ADDENDUM REPORT: 06/14/2022 11:10 ADDENDUM: Study discussed by telephone with Dr. Frederich Chick LAMA on 06/14/2022 at 1101 hours. Electronically Signed   By: Genevie Ann M.D.   On: 06/14/2022 11:10   Result Date: 06/14/2022 CLINICAL DATA:  81 year old male with small vessel infarcts in the left basal ganglia and left occipital lobe white matter on brain MRI 2 days ago. Metastatic colon cancer. EXAM: CT ANGIOGRAPHY HEAD AND NECK TECHNIQUE: Multidetector CT imaging of the head and neck was performed using the standard protocol during bolus administration of intravenous contrast. Multiplanar CT image reconstructions and MIPs were obtained to evaluate the vascular anatomy. Carotid stenosis measurements (when applicable) are obtained utilizing NASCET criteria, using the distal internal carotid diameter as the denominator. RADIATION DOSE REDUCTION: This exam was performed according to the departmental dose-optimization program which includes automated exposure control, adjustment of the mA and/or kV according to patient size and/or use of iterative reconstruction technique.  CONTRAST:  142m OMNIPAQUE IOHEXOL 350 MG/ML SOLN COMPARISON:  Head CT and brain MRI 03/12/2022. Restaging CT Chest, Abdomen, and Pelvis 04/16/2022 FINDINGS: CT HEAD Brain: Calcified atherosclerosis at the skull base. No acute intracranial hemorrhage identified. No midline shift, mass effect, or evidence of intracranial mass lesion. Lacunar infarcts in the left basal ganglia and occipital lobe white matter remain occult by CT. Stable gray-white matter differentiation throughout the brain. Chronic posterosuperior left MCA territory encephalomalacia. Chronic lacunar infarct of the left caudate. Calvarium and skull base: Negative. Paranasal sinuses: Visualized paranasal sinuses and mastoids are stable and well aerated. Orbits: Visualized orbits and scalp soft tissues are within normal limits. CTA NECK Skeleton: Absent dentition. Cervical spine degeneration with degenerative appearing ankylosis of C4-C5 facets. Bulky lower cervical endplate osteophytes with C6-C7 ankylosis. No acute osseous abnormality identified. Upper chest: Stable layering left pleural effusion. Numerous small upper lung nodules compatible with pulmonary metastatic disease appears stable since the June restaging exam. No superior mediastinal lymphadenopathy. Right chest Port-A-Cath remains in place. Other neck: No superimposed neck mass or lymphadenopathy identified. Aortic arch: Calcified aortic atherosclerosis. Possible four vessel arch configuration, the left vertebral artery arises at or near the arch on series 5, image 180. Calcified aortic atherosclerosis. Right carotid system: Brachiocephalic artery plaque without stenosis. Bulky soft plaque in the posterior right ICA origin, partially calcified at the bulb. Subsequent origin stenosis up to 60 % with respect to the distal vessel (series 9, image 70). Right ICA remains patent. Left carotid system: Negative left CCA origins. Soft plaque in the medial left CCA before the bifurcation on series 5,  image 124. No significant stenosis. Bulky soft plaque or thrombus and  mild calcified plaque in the left ICA origin and bulb with subtotal occlusion, radiographic string sign stenosis (series 9, image 145). Subsequent decreased caliber of the downstream left ICA which remains patent to the skull base. Vertebral arteries: Proximal right subclavian artery plaque and tortuosity without stenosis. Normal right vertebral artery origin. Dominant right vertebral artery is patent to the skull base with no plaque or stenosis. Calcified atherosclerosis at the left vertebral artery origin which appears to be from the arch on series 8, image 182. Severe origin stenosis but the left vertebral artery remains patent. Non dominant left vertebral is patent to the skull base without additional plaque or stenosis. CTA HEAD Posterior circulation: No distal vertebral plaque or stenosis. Patent PICA origins and vertebrobasilar junction. Patent basilar artery without stenosis. SCA and PCA origins are normal. Posterior communicating arteries are diminutive or absent. Mild bilateral PCA P1 and P2 segment irregularity and stenosis greater on the left (series 10, image 67). Patent PCA branches. Anterior circulation: Both ICA siphons are patent. Moderate bilateral siphon calcified plaque. But no significant siphon stenosis. Normal ophthalmic artery origins. Patent carotid termini, MCA and ACA origins. Normal anterior communicating artery. Bilateral ACA branches are within normal limits. Left MCA M1 segment and bifurcation are patent without stenosis. Left MCA branches are within normal limits. Right MCA M1 segment and bifurcation are patent without stenosis. Right MCA branches are within normal limits. Venous sinuses: Patent. Anatomic variants: Left vertebral artery arises directly from the arch, 4 vessel arch configuration. Mildly dominant right vertebral. Review of the MIP images confirms the above findings IMPRESSION: 1. Negative for large  vessel occlusion but Positive for SUBTOTAL OCCLUSION of the Left ICA origin and bulb due to bulky soft plaque or thrombus. RADIOGRAPHIC STRING SIGN STENOSIS. 2. Up to 60% stenosis at the Right ICA origin due to bulky soft plaque. 3. 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 4. But no other significant arterial stenosis in the head or neck. Mild for age intracranial atherosclerosis. 5. Small vessel infarcts seen on MRI 2 days ago remain occult by CT. No new intracranial abnormality. 6. Stable layering left pleural effusion and pulmonary metastatic disease since June. 7. Aortic Atherosclerosis (ICD10-I70.0). Electronically Signed: By: Genevie Ann M.D. On: 06/14/2022 10:55   CT ANGIO HEAD W OR WO CONTRAST  Addendum Date: 06/14/2022   ADDENDUM REPORT: 06/14/2022 11:10 ADDENDUM: Study discussed by telephone with Dr. Frederich Chick LAMA on 06/14/2022 at 1101 hours. Electronically Signed   By: Genevie Ann M.D.   On: 06/14/2022 11:10   Result Date: 06/14/2022 CLINICAL DATA:  81 year old male with small vessel infarcts in the left basal ganglia and left occipital lobe white matter on brain MRI 2 days ago. Metastatic colon cancer. EXAM: CT ANGIOGRAPHY HEAD AND NECK TECHNIQUE: Multidetector CT imaging of the head and neck was performed using the standard protocol during bolus administration of intravenous contrast. Multiplanar CT image reconstructions and MIPs were obtained to evaluate the vascular anatomy. Carotid stenosis measurements (when applicable) are obtained utilizing NASCET criteria, using the distal internal carotid diameter as the denominator. RADIATION DOSE REDUCTION: This exam was performed according to the departmental dose-optimization program which includes automated exposure control, adjustment of the mA and/or kV according to patient size and/or use of iterative reconstruction technique. CONTRAST:  175m OMNIPAQUE IOHEXOL 350 MG/ML SOLN COMPARISON:  Head CT and brain  MRI 03/12/2022. Restaging CT Chest, Abdomen, and Pelvis 04/16/2022 FINDINGS: CT HEAD Brain: Calcified atherosclerosis at the skull  base. No acute intracranial hemorrhage identified. No midline shift, mass effect, or evidence of intracranial mass lesion. Lacunar infarcts in the left basal ganglia and occipital lobe white matter remain occult by CT. Stable gray-white matter differentiation throughout the brain. Chronic posterosuperior left MCA territory encephalomalacia. Chronic lacunar infarct of the left caudate. Calvarium and skull base: Negative. Paranasal sinuses: Visualized paranasal sinuses and mastoids are stable and well aerated. Orbits: Visualized orbits and scalp soft tissues are within normal limits. CTA NECK Skeleton: Absent dentition. Cervical spine degeneration with degenerative appearing ankylosis of C4-C5 facets. Bulky lower cervical endplate osteophytes with C6-C7 ankylosis. No acute osseous abnormality identified. Upper chest: Stable layering left pleural effusion. Numerous small upper lung nodules compatible with pulmonary metastatic disease appears stable since the June restaging exam. No superior mediastinal lymphadenopathy. Right chest Port-A-Cath remains in place. Other neck: No superimposed neck mass or lymphadenopathy identified. Aortic arch: Calcified aortic atherosclerosis. Possible four vessel arch configuration, the left vertebral artery arises at or near the arch on series 5, image 180. Calcified aortic atherosclerosis. Right carotid system: Brachiocephalic artery plaque without stenosis. Bulky soft plaque in the posterior right ICA origin, partially calcified at the bulb. Subsequent origin stenosis up to 60 % with respect to the distal vessel (series 9, image 70). Right ICA remains patent. Left carotid system: Negative left CCA origins. Soft plaque in the medial left CCA before the bifurcation on series 5, image 124. No significant stenosis. Bulky soft plaque or thrombus and mild  calcified plaque in the left ICA origin and bulb with subtotal occlusion, radiographic string sign stenosis (series 9, image 145). Subsequent decreased caliber of the downstream left ICA which remains patent to the skull base. Vertebral arteries: Proximal right subclavian artery plaque and tortuosity without stenosis. Normal right vertebral artery origin. Dominant right vertebral artery is patent to the skull base with no plaque or stenosis. Calcified atherosclerosis at the left vertebral artery origin which appears to be from the arch on series 8, image 182. Severe origin stenosis but the left vertebral artery remains patent. Non dominant left vertebral is patent to the skull base without additional plaque or stenosis. CTA HEAD Posterior circulation: No distal vertebral plaque or stenosis. Patent PICA origins and vertebrobasilar junction. Patent basilar artery without stenosis. SCA and PCA origins are normal. Posterior communicating arteries are diminutive or absent. Mild bilateral PCA P1 and P2 segment irregularity and stenosis greater on the left (series 10, image 67). Patent PCA branches. Anterior circulation: Both ICA siphons are patent. Moderate bilateral siphon calcified plaque. But no significant siphon stenosis. Normal ophthalmic artery origins. Patent carotid termini, MCA and ACA origins. Normal anterior communicating artery. Bilateral ACA branches are within normal limits. Left MCA M1 segment and bifurcation are patent without stenosis. Left MCA branches are within normal limits. Right MCA M1 segment and bifurcation are patent without stenosis. Right MCA branches are within normal limits. Venous sinuses: Patent. Anatomic variants: Left vertebral artery arises directly from the arch, 4 vessel arch configuration. Mildly dominant right vertebral. Review of the MIP images confirms the above findings IMPRESSION: 1. Negative for large vessel occlusion but Positive for SUBTOTAL OCCLUSION of the Left ICA origin  and bulb due to bulky soft plaque or thrombus. RADIOGRAPHIC STRING SIGN STENOSIS. 2. Up to 60% stenosis at the Right ICA origin due to bulky soft plaque. 3. 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 4. But no other significant arterial stenosis in the head or neck. Mild  for age intracranial atherosclerosis. 5. Small vessel infarcts seen on MRI 2 days ago remain occult by CT. No new intracranial abnormality. 6. Stable layering left pleural effusion and pulmonary metastatic disease since June. 7. Aortic Atherosclerosis (ICD10-I70.0). Electronically Signed: By: Genevie Ann M.D. On: 06/14/2022 10:55   US Carotid Bilateral (at [facility] and [facility] only)  Result Date: 06/13/2022 CLINICAL DATA:  Stroke Syncope Hyperlipidemia Diabetes Tobacco abuse EXAM: BILATERAL CAROTID DUPLEX ULTRASOUND TECHNIQUE: Pearline Cables scale imaging, color Doppler and duplex ultrasound were performed of bilateral carotid and vertebral arteries in the neck. COMPARISON:  None available FINDINGS: Criteria: Quantification of carotid stenosis is based on velocity parameters that correlate the residual internal carotid diameter with NASCET-based stenosis levels, using the diameter of the distal internal carotid lumen as the denominator for stenosis measurement. The following velocity measurements were obtained: RIGHT ICA: 145/44 cm/sec CCA: 29/52 cm/sec SYSTOLIC ICA/CCA RATIO:  1.8 ECA: 107 cm/sec LEFT ICA: 352/100 cm/sec CCA: 84/1 cm/sec SYSTOLIC ICA/CCA RATIO:  7.3 ECA: 91 cm/sec RIGHT CAROTID ARTERY: Moderate focal heterogeneous plaque noted in the carotid bulb. RIGHT VERTEBRAL ARTERY:  Antegrade flow. LEFT CAROTID ARTERY: Extensive heterogeneous plaque of the left internal carotid artery origin. LEFT VERTEBRAL ARTERY:  Antegrade flow. IMPRESSION: 1. 50-69% stenosis of the right internal carotid artery. 2. Greater than 70% stenosis of the left carotid artery. 3. Further evaluation with CT angiography of the neck  should be performed to better quantify plaque burden and degree of stenosis. Electronically Signed   By: Miachel Roux M.D.   On: 06/13/2022 15:02   MR BRAIN WO CONTRAST  Result Date: 06/12/2022 CLINICAL DATA:  Right-sided weakness EXAM: MRI HEAD WITHOUT CONTRAST TECHNIQUE: Multiplanar, multiecho pulse sequences of the brain and surrounding structures were obtained without intravenous contrast. COMPARISON:  None Available. FINDINGS: Brain: There are small foci of acute/subacute ischemia the left internal capsule posterior limb and in the left occipital lobe. No acute or chronic hemorrhage. There is multifocal hyperintense T2-weighted signal within the white matter. Generalized volume loss. Old left basal ganglia small vessel infarct. The midline structures are normal. Vascular: Major flow voids are preserved. Skull and upper cervical spine: Normal calvarium and skull base. Visualized upper cervical spine and soft tissues are normal. Sinuses/Orbits:No paranasal sinus fluid levels or advanced mucosal thickening. No mastoid or middle ear effusion. Normal orbits. IMPRESSION: Small foci of acute/subacute ischemia in the left internal capsule posterior limb and left occipital lobe. No hemorrhage or mass effect. Electronically Signed   By: Ulyses Jarred M.D.   On: 06/12/2022 21:01   CT HEAD WO CONTRAST  Result Date: 06/12/2022 CLINICAL DATA:  Neurological deficit EXAM: CT HEAD WITHOUT CONTRAST TECHNIQUE: Contiguous axial images were obtained from the base of the skull through the vertex without intravenous contrast. RADIATION DOSE REDUCTION: This exam was performed according to the departmental dose-optimization program which includes automated exposure control, adjustment of the mA and/or kV according to patient size and/or use of iterative reconstruction technique. COMPARISON:  Head CT dated November 09, 2020 FINDINGS: Brain: Old lacunar infarcts of the bilateral basal ganglia. No evidence of acute infarction,  hemorrhage, hydrocephalus, extra-axial collection or mass lesion/mass effect. Vascular: No hyperdense vessel or unexpected calcification. Skull: Normal. Negative for fracture or focal lesion. Sinuses/Orbits: No acute finding. Other: None. IMPRESSION: No acute intracranial abnormality. Electronically Signed   By: Yetta Glassman M.D.   On: 06/12/2022 17:46    Medications: Scheduled:  amLODipine  5 mg Oral Daily   atorvastatin  20 mg Oral Daily  Continuous:  heparin      Assessment: 81 year old male presenting with acute onset of right sided weakness - Exam still with right sided deficits but improved since yesterday - Imaging:  - MRI brain: Small foci of acute/subacute ischemia in the posterior limb of the left internal capsule and left occipital lobe. Also noted are multifocal hyperintense T2-weighted lesions within the white matter, significantly worse in the left frontal and parietal regions subcortically. There is generalized volume loss. An old left basal ganglia small vessel infarct is noted. Major flow voids are preserved, but the left ICA appears smaller in caliber than the right. Images personally reviewed. - Carotid ultrasound: 50-69% stenosis of the right internal carotid artery. Greater than 70% stenosis of the left carotid artery. Further evaluation with CT angiography of the neck should be performed to better quantify plaque burden and degree of stenosis. 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 RADIOGRAPHIC STRING SIGN STENOSIS.  - Also noted on CTA neck: Up to 60% stenosis at the Right ICA origin due to bulky soft plaque. 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 Aortic Atherosclerosis  - CTA of head: No significant arterial stenosis in the head. Mild for age intracranial atherosclerosis. - TTE with bubble study: Report pending.  - EKG: Sinus rhythm  with Premature atrial complexes with Abberant conduction; Right bundle branch block; Inferior infarct , age undetermined; Abnormal ECG; When compared with ECG of 15-Jan-2021 06:58: Left anterior fascicular block is no longer Present; Inferior infarct is now Present; Inverted T waves have replaced nonspecific T wave abnormality in Inferior leads - Stroke risk factors: Cancer history, HLD, HTN and prediabetes   Recommendations: - Holding Plavix and starting IV heparin with no-bolus stroke protocol. Appreciate Pharmacy assistance. Discussed with Dr. Darrick Meigs - Vascular Surgery is being consulted - Continue atorvastatin - BP management  - HgbA1c, fasting lipid panel - PT consult, OT consult, Speech consult - Risk factor modification - Telemetry monitoring - Frequent neuro checks - NPO until passes stroke swallow screen - Neurology will follow PRN. Will defer to Vascular Surgery on decision to surgically treat with CEA as well as timing for restarting antiplatelet medication after heparin is discontinued.    LOS: 0 days   '@Electronically'$  signed: Dr. Kerney Elbe 06/14/2022  11:25 AM

## 2022-06-14 NOTE — Consult Note (Signed)
ANTICOAGULATION CONSULT NOTE  Pharmacy Consult for IV Heparin Indication:  Carotid artery stenosis /  acute CVA  Patient Measurements: Height: 6' (182.9 cm) IBW/kg (Calculated) : 77.6 Heparin Dosing Weight: 69.1 kg  Labs: Recent Labs    06/12/22 1656 06/13/22 0445 06/14/22 0506 06/14/22 1132 06/14/22 1702 06/14/22 2005  HGB 10.6* 9.4* 9.2* 11.0* 10.2*  --   HCT 33.5* 28.5* 28.0* 33.6* 31.1*  --   PLT 180 127* 126*  --   --   --   APTT 31  --   --   --   --   --   LABPROT 15.1  --   --  15.5*  --   --   INR 1.2  --   --  1.2  --   --   HEPARINUNFRC  --   --   --   --   --  0.16*  CREATININE 0.75 0.68 0.72  --   --   --      Estimated Creatinine Clearance: 74.4 mL/min (by C-G formula based on SCr of 0.72 mg/dL).   Medications:  No anticoagulation or antiplatelets prior to admission per my chart review  Assessment: Patient is a 81 y/o M with medical history including HLD, prediabetes, colon cancer s/p colectomy who presented to the ED with acute right upper and lower extremity weakness and slurred speech. He was found to have acute CVA. Pharmacy consulted to initiate and manage heparin infusion per stroke protocol.    Goal of Therapy:  Heparin level 0.3 - 0.5 units/ml Monitor platelets by anticoagulation protocol: Yes   Plan: HL 0.16 - subtherapeutic --Increase heparin infusion to 1,000 units/hr --Heparin level 8 hours after rate change --Daily CBC per protocol while on IV heparin  Wynelle Cleveland 06/14/2022,8:57 PM

## 2022-06-14 NOTE — Progress Notes (Signed)
  Echocardiogram 2D Echocardiogram with bubble study has been performed.  Darlina Sicilian M 06/14/2022, 10:18 AM

## 2022-06-14 NOTE — Progress Notes (Addendum)
[facility]  PROGRESS NOTE  YSMAEL HIRES JGG:[id number] DOB: 1941-09-19 DOA: 06/12/2022 PCP: Sofie Hartigan, MD   Brief HPI:   81 year old Caucasian male with a history of dyslipidemia, prediabetes, colon cancer s/p colectomy came to ED with acute onset of right upper and lower extremity weakness and slurred speech after he woke up.  Noncontrast CT head showed no acute abnormality.  Brain MRI revealed small foci of acute/subacute ischemia in the left internal capsule posterior limb and left occipital lobe with no hemorrhage or mass.  Patient was given 4 baby aspirin and oxycodone. Neurology was consulted    Subjective   Patient seen and examined, no new complaints.  Strength is improving.   Assessment/Plan:   CVA -MRI brain shows acute/subacute left internal capsule posterior limb and left occipital lobe infarction -Carotid duplex showed 50 to 69% stenosis of right internal carotid artery, greater than 70% stenosis of left carotid artery.  CTA neck recommended per radiology.  Will order CTA neck. - Started on aspirin Plavix -Continue atorvastatin 20 mg daily -Lipid profile showed LDL 79, HDL 40 -PT/OT evaluation obtained, recommend home health PT -We will also obtain CTA head as per neuro recommendation -Neurology following  GERD -Carafate on hold  Dyslipidemia -Continue atorvastatin  Hypertension -Blood pressure is stable -Patient is out of window for permissive hypertension -Amlodipine is listed in med rec however patient not taking -Continue to monitor patient blood pressure and start amlodipine if blood pressure rises  BPH -Patient has not taken Flomax in long time as per med rec  Pancytopenia -Mild, noted to have mild pancytopenia  -Unclear etiology -Follow CBC in a.m.      Medications     atorvastatin  20 mg Oral Daily   clopidogrel  75 mg Oral Daily     Data Reviewed:   CBG:  Recent Labs  Lab 06/12/22 1641  GLUCAP 84    SpO2:  100 %    Vitals:   06/13/22 1918 06/13/22 2350 06/14/22 0421 06/14/22 0740  BP: 128/69 136/68 119/69 (!) 153/83  Pulse: 70 72 68 64  Resp: '20 19 18 16  '$ Temp: 98.2 F (36.8 C)   (!) 97.4 F (36.3 C)  TempSrc: Oral     SpO2: 100% 100% 100% 100%  Height:          Data Reviewed:  Basic Metabolic Panel: Recent Labs  Lab 06/12/22 1656 06/13/22 0445 06/14/22 0506  NA 140 138 140  K 4.2 3.9 3.7  CL 108 111 112*  CO2 '28 24 25  '$ GLUCOSE 101* 93 91  BUN '13 12 12  '$ CREATININE 0.75 0.68 0.72  CALCIUM 8.4* 7.9* 7.9*    CBC: Recent Labs  Lab 06/12/22 1656 06/13/22 0445 06/14/22 0506  WBC 3.1* 2.9* 3.0*  NEUTROABS 1.8  --   --   HGB 10.6* 9.4* 9.2*  HCT 33.5* 28.5* 28.0*  MCV 100.3* 96.3 96.9  PLT 180 127* 126*    LFT Recent Labs  Lab 06/12/22 1656  AST 32  ALT 22  ALKPHOS 156*  BILITOT 0.9  PROT 7.1  ALBUMIN 3.0*     Antibiotics: Anti-infectives (From admission, onward)    None        DVT prophylaxis: Lovenox  Code Status: Full code  Family Communication: No family at bedside   CONSULTS neurology   Objective    Physical Examination:   General-appears in no acute distress Heart-S1-S2, regular, no murmur auscultated Lungs-clear to auscultation bilaterally, no wheezing or  crackles auscultated Abdomen-soft, nontender, no organomegaly Extremities-no edema in the lower extremities Neuro-alert, oriented x3, motor strength 5/5 in all extremities, mild ataxia in right upper extremity  Status is: observation- Stroke           [facility] If 7PM-7AM, please contact night-coverage at [email or web address], Office  [phone]   06/14/2022, 8:41 AM  LOS: 0 days

## 2022-06-14 NOTE — Care Management Obs Status (Signed)
[place] NOTIFICATION   Patient Details  Name: OBI SCRIMA MRN: [id number] Date of Birth: [birth year]   Medicare Observation Status Notification Given:  Yes    Valente David, RN 06/14/2022, 9:08 AM

## 2022-06-14 NOTE — TOC Progression Note (Addendum)
Transition of Care [facility]) - Progression Note    Patient Details  Name: Oscar [name] MRN: [id number] Date of Birth: 1941/04/17  Transition of Care [facility] CM/SW Contact  Valente David, RN Phone Number: 06/14/2022, 9:03 AM  Clinical Narrative:     Spoke with patient, updated on search for [facility] agencies.  No call back from [facility] with [facility], Message left for [facility] with [place].  Will provide update to patient as needed.   Update 10:03 [name] will accept patient for [facility] services (PT/OT/SLP).  Spoke with Mardene Celeste at [employer], will provide rolling walker per PT recommendations, patient agrees, does not have one at home.    Expected Discharge Plan: [place] Barriers to Discharge: Continued Medical Work up  Expected Discharge Plan and Services Expected Discharge Plan: [place] arrangements for the past 2 months: Single Family Home                                       Social Determinants of Health (SDOH) Interventions    Readmission Risk Interventions     No data to display

## 2022-06-14 NOTE — Progress Notes (Signed)
      Daily Progress Note  Briefly reviewed the CTA head and Neck.  Findings of MRI also noted.  - Pt has a L ICA STO vs focal total occlusion. - Given improve deficits, I usually give pt between 2-4 weeks before proceeding with L CEA in this patient - Results with TCAR have been inferior to CEA for these acute neuro events. - PT/OT/IPR - Pt need cardiac preop risk stratification and optimization - Full consult to follow    Adele Barthel, MD, FACS, FSVS Covering for [facility]: [phone]  06/14/2022, 11:53 AM

## 2022-06-14 NOTE — Progress Notes (Signed)
Physical Therapy Treatment Patient Details Name: Oscar [name] MRN: [id number] DOB: 13-Jan-1941 Today's Date: 06/14/2022   History of Present Illness Pt is a 81 y/o M admitted on 06/12/22 after presenting to the ED with c/o acute onset of R sided UE & LE weakness & slurred speech. Brain MRI without contrast revealed small foci of acute/subacute ischemia in the left internal capsule posterior limb and left occipital lobe with no hemorrhage or mass. PMH: dyslipidemia, prediabetes, colon CA s/p colectomy    PT Comments    Pt seen for PT tx with pt agreeable. Pt is able to complete supine>sit with HOB elevated & bed rails. Pt requires 2 attempts for each STS during session 2/2 weakness while powering to stand. Initially focused on gait without AD with focus on dynamic balance & RLE NMR. Pt demonstrates impaired RLE step width & R lateral LOB, frequently reaching for object for support with 1UE. When ambulating with RW pt is able to ambulate increased distances with improved balance & pt noting he feels better. Discussed need for someone to be beside him when up & walking at home. Pt reports he feels he can go home with assistance from family vs further rehab, such as SNF & PT agrees since pt reports he has family that can provide physical assistance if needed. Will continue to follow pt acutely to address balance, R UE/LE NMR, and gait with LRAD.     Recommendations for follow up therapy are one component of a multi-disciplinary discharge planning process, led by the attending physician.  Recommendations may be updated based on patient status, additional functional criteria and insurance authorization.  Follow Up Recommendations  Home health PT     Assistance Recommended at Discharge Frequent or constant Supervision/Assistance  Patient can return home with the following A little help with walking and/or transfers;A little help with bathing/dressing/bathroom;Assistance with cooking/housework;Help  with stairs or ramp for entrance   Equipment Recommendations  Rolling walker (2 wheels)    Recommendations for Other Services       Precautions / Restrictions Precautions Precautions: Fall Precaution Comments: R hemi Restrictions Weight Bearing Restrictions: No     Mobility  Bed Mobility Overal bed mobility: Needs Assistance Bed Mobility: Supine to Sit     Supine to sit: Supervision, HOB elevated     General bed mobility comments: use of bed rails    Transfers Overall transfer level: Needs assistance Equipment used: None Transfers: Sit to/from Stand Sit to Stand: Min assist (pt requires 2 attempts for STS x 2 trials 2/2 decreased strength)                Ambulation/Gait Ambulation/Gait assistance: Min assist, Min guard Gait Distance (Feet): 50 Feet (+ 175 ft) Assistive device: None, Rolling walker (2 wheels)         General Gait Details: Pt ambulates initially without AD with pt intermittently holding to rail/bed with 1UE. When ambulating without AD pt demonstrates decreased stride width RLE & increased R lateral lean & LOB 2/2 to this. Pt is able to increase step width with verbal cuing/education but still requires min assist. After rest break pt ambulates with RW around the nurses station with CGA with increased balance & pt reports this feels better. During gait pt does demonstrate decreased RLE hip flexion during swing phase.   Stairs             Wheelchair Mobility    Modified Rankin (Stroke Patients Only)       Balance  Overall balance assessment: Needs assistance Sitting-balance support: Feet supported Sitting balance-Leahy Scale: Good     Standing balance support: No upper extremity supported, During functional activity Standing balance-Leahy Scale: Fair                              Cognition Arousal/Alertness: Awake/alert Behavior During Therapy: WFL for tasks assessed/performed Overall Cognitive Status: Within  Functional Limits for tasks assessed                                 General Comments: pleasant man, follows commands throughout session without issue        Exercises General Exercises - Lower Extremity Hip Flexion/Marching: AROM, Strengthening, Right, 10 reps, Seated (educated pt on need to perform 10x/hour for strengthening purposes)    General Comments        Pertinent Vitals/Pain Pain Assessment Pain Assessment: No/denies pain    Home Living                          Prior Function            PT Goals (current goals can now be found in the care plan section) Acute Rehab PT Goals Patient Stated Goal: get better, go home PT Goal Formulation: With patient Time For Goal Achievement: 06/27/22 Potential to Achieve Goals: Good Progress towards PT goals: Progressing toward goals    Frequency    7X/week      PT Plan Current plan remains appropriate    Co-evaluation              AM-PAC PT "6 Clicks" Mobility   Outcome Measure  Help needed turning from your back to your side while in a flat bed without using bedrails?: None Help needed moving from lying on your back to sitting on the side of a flat bed without using bedrails?: None Help needed moving to and from a bed to a chair (including a wheelchair)?: A Little Help needed standing up from a chair using your arms (e.g., wheelchair or bedside chair)?: A Little Help needed to walk in hospital room?: A Little Help needed climbing 3-5 steps with a railing? : A Little 6 Click Score: 20    End of Session Equipment Utilized During Treatment: Gait belt Activity Tolerance: Patient tolerated treatment well;Patient limited by fatigue Patient left: in chair;with chair alarm set;with call bell/phone within reach   PT Visit Diagnosis: Unsteadiness on feet (R26.81);Muscle weakness (generalized) (M62.81);Hemiplegia and hemiparesis Hemiplegia - Right/Left: Right Hemiplegia -  dominant/non-dominant: Dominant Hemiplegia - caused by: Cerebral infarction     Time: 6063-0160 PT Time Calculation (min) (ACUTE ONLY): 11 min  Charges:  $Neuromuscular Re-education: 8-22 mins                     [name], PT, DPT 06/14/22, 11:09 AM   [name] 06/14/2022, 11:07 AM

## 2022-06-14 NOTE — Consult Note (Signed)
VASCULAR SURGERY CONSULTATION   Requested by:  Dr. Eleonore Chiquito  Reason for consultation: Left CVA    History of Present Illness   Oscar [name] is a 81 y.o. (03-03-1941) male who presents with cc: right sided weakness.  Pt woke Friday with difficulty getting out of bed.  Pt has some vertigo initially which resolved.  Pt noticed Right arm and leg weakness.  His family noticed pt speaking gibberish.  Pt's weakness is improved.  Pt is able to ambulate but has discoordination in RIGHT arm.  Pt continues to have some difficulty with word findings.  Past Medical History:  Diagnosis Date   Arthritis    Cancer ([place])    colon cancer 02/2019 per pt    Family history of adverse reaction to anesthesia    cousin took all night to wake up from anesthesia   H/O colon cancer, stage IV    Hyperlipemia    Neuromuscular disorder ([place])    Pre-diabetes     Past Surgical History:  Procedure Laterality Date   COLON SURGERY     ESOPHAGOGASTRODUODENOSCOPY (EGD) WITH PROPOFOL N/A 12/26/2020   Procedure: ESOPHAGOGASTRODUODENOSCOPY (EGD) WITH PROPOFOL;  Surgeon: [name], MD;  Location: [facility] ENDOSCOPY;  Service: Gastroenterology;  Laterality: N/A;   IR ANGIOGRAM SELECTIVE EACH ADDITIONAL VESSEL  12/19/2020   IR ANGIOGRAM SELECTIVE EACH ADDITIONAL VESSEL  03/24/2021   IR ANGIOGRAM SELECTIVE EACH ADDITIONAL VESSEL  03/24/2021   IR ANGIOGRAM SELECTIVE EACH ADDITIONAL VESSEL  04/04/2021   IR ANGIOGRAM SELECTIVE EACH ADDITIONAL VESSEL  04/04/2021   IR ANGIOGRAM SELECTIVE EACH ADDITIONAL VESSEL  04/04/2021   IR ANGIOGRAM VISCERAL SELECTIVE  12/19/2020   IR ANGIOGRAM VISCERAL SELECTIVE  03/24/2021   IR ANGIOGRAM VISCERAL SELECTIVE  04/04/2021   IR ANGIOGRAM VISCERAL SELECTIVE  04/04/2021   IR EMBO ARTERIAL NOT HEMORR HEMANG INC GUIDE ROADMAPPING  03/24/2021   IR EMBO TUMOR ORGAN ISCHEMIA INFARCT INC GUIDE ROADMAPPING  12/19/2020   IR EMBO TUMOR ORGAN ISCHEMIA INFARCT INC GUIDE ROADMAPPING  04/04/2021   IR IMAGING  GUIDED PORT INSERTION  07/20/2019   IR RADIOLOGIST EVAL & MGMT  12/05/2020   IR RADIOLOGIST EVAL & MGMT  02/11/2021   IR RADIOLOGIST EVAL & MGMT  03/04/2021   IR RADIOLOGIST EVAL & MGMT  04/29/2021   IR RADIOLOGIST EVAL & MGMT  07/22/2021   IR RADIOLOGIST EVAL & MGMT  11/19/2021   IR US GUIDE VASC ACCESS RIGHT  12/19/2020   IR US GUIDE VASC ACCESS RIGHT  03/24/2021   IR US GUIDE VASC ACCESS RIGHT  04/04/2021   JOINT REPLACEMENT Left 2010   Knee   RADIOLOGY WITH ANESTHESIA N/A 01/15/2021   Procedure: CT WITH ANESTHESIA MICROWAVE ABLATION OF LIVER;  Surgeon: Criselda Peaches, MD;  Location: [facility] ORS;  Service: Anesthesiology;  Laterality: N/A;     Social History   Socioeconomic History   Marital status: Married    Spouse name: Not on file   Number of children: Not on file   Years of education: Not on file   Highest education level: Not on file  Occupational History   Not on file  Tobacco Use   Smoking status: Every Day    Packs/day: 0.25    Years: 15.00    Total pack years: 3.75    Types: Cigarettes   Smokeless tobacco: Never   Tobacco comments:    1 to 2 cigarettes a day occasionally  Vaping Use   Vaping Use: Never  used  Substance and Sexual Activity   Alcohol use: Yes    Comment: rarely    Drug use: No   Sexual activity: Not on file  Other Topics Concern   Not on file  Social History Narrative   Recruitment consultant retd; lives in [place]; smoking 3cig/day; [3/4 ppd x started at 9 years]; no alcohol. Son & daughter; wife dementia [waiting for placement].    Social Determinants of Health   Financial Resource Strain: Not on file  Food Insecurity: Not on file  Transportation Needs: Not on file  Physical Activity: Not on file  Stress: Not on file  Social Connections: Not on file  Intimate Partner Violence: Not on file   Family History  Problem Relation Age of Onset   Peptic Ulcer Disease Father     Current Facility-Administered Medications  Medication Dose  Route Frequency Provider Last Rate Last Admin   acetaminophen (TYLENOL) tablet 650 mg  650 mg Oral Q6H PRN Mansy, Jan A, MD       Or   acetaminophen (TYLENOL) suppository 650 mg  650 mg Rectal Q6H PRN Mansy, Jan A, MD       amLODipine (NORVASC) tablet 5 mg  5 mg Oral Daily Darrick Meigs, Gagan S, MD   5 mg at 06/14/22 0900   atorvastatin (LIPITOR) tablet 20 mg  20 mg Oral Daily Mansy, Jan A, MD   20 mg at 06/14/22 0900   Chlorhexidine Gluconate Cloth 2 % PADS 6 each  6 each Topical Daily Oswald Hillock, MD   6 each at 06/14/22 1440   heparin ADULT infusion 100 units/mL (25000 units/259m)  850 Units/hr Intravenous Continuous CBenita Gutter RPH 8.5 mL/hr at 06/14/22 1149 850 Units/hr at 06/14/22 1149   magnesium hydroxide (MILK OF MAGNESIA) suspension 30 mL  30 mL Oral Daily PRN Mansy, Jan A, MD       ondansetron ([facility] tablet 4 mg  4 mg Oral Q6H PRN Mansy, Jan A, MD       Or   ondansetron ([facility] injection 4 mg  4 mg Intravenous Q6H PRN Mansy, JArvella Merles MD       Oral care mouth rinse  15 mL Mouth Rinse PRN Mansy, Jan A, MD       oxyCODONE (Oxy IR/ROXICODONE) immediate release tablet 5 mg  5 mg Oral Q8H PRN Mansy, Jan A, MD       sodium chloride flush (NS) 0.9 % injection 10-40 mL  10-40 mL Intracatheter PRN LOswald Hillock MD       traZODone (DESYREL) tablet 25 mg  25 mg Oral QHS PRN Mansy, Jan A, MD   25 mg at 06/13/22 2346   Facility-Administered Medications Ordered in Other Encounters  Medication Dose Route Frequency Provider Last Rate Last Admin   atropine 1 MG/ML injection            palonosetron (ALOXI) 0.25 MG/5ML injection             Allergies  Allergen Reactions   Ace Inhibitors Swelling    REVIEW OF SYSTEMS (negative unless checked):   Cardiac:  '[]'$  Chest pain or chest pressure? '[]'$  Shortness of breath upon activity? '[]'$  Shortness of breath when lying flat? '[]'$  Irregular heart rhythm?  Vascular:  '[]'$  Pain in calf, thigh, or hip brought on by walking? '[]'$  Pain in feet at night that  wakes you up from your sleep? '[]'$  Blood clot in your veins? '[]'$  Leg swelling?  Pulmonary:  '[]'$  Oxygen at  home? '[]'$  Productive cough? '[]'$  Wheezing?  Neurologic:  '[x]'$  Sudden weakness in arms or legs? '[]'$  Sudden numbness in arms or legs? '[x]'$  Sudden onset of difficult speaking or slurred speech? '[]'$  Temporary loss of vision in one eye? '[]'$  Problems with dizziness?  Gastrointestinal:  '[]'$  Blood in stool? '[]'$  Vomited blood?  Genitourinary:  '[]'$  Burning when urinating? '[]'$  Blood in urine?  Psychiatric:  '[]'$  Major depression  Hematologic:  '[]'$  Bleeding problems? '[]'$  Problems with blood clotting?  Dermatologic:  '[]'$  Rashes or ulcers?  Constitutional:  '[]'$  Fever or chills?  Ear/Nose/Throat:  '[]'$  Change in hearing? '[]'$  Nose bleeds? '[]'$  Sore throat?  Musculoskeletal:  '[]'$  Back pain? '[]'$  Joint pain? '[]'$  Muscle pain?   Physical Examination     Vitals:   06/14/22 0421 06/14/22 0740 06/14/22 1139 06/14/22 1521  BP: 119/69 (!) 153/83 117/72 115/69  Pulse: 68 64 67 66  Resp: '18 16 17 16  '$ Temp:  (!) 97.4 F (36.3 C) 98.1 F (36.7 C) 98.2 F (36.8 C)  TempSrc:   Oral Oral  SpO2: 100% 100% 100% 100%  Height:       Body mass index is 20.67 kg/m.  General Alert, O x 3, cachectic, NAD  Head [place]/AT, Temporalis wasting  Ear/Nose/ Throat Hearing grossly intact, nares without erythema or drainage, oropharynx with Erythema without Exudate, Mallampati score: 3,   Eyes PERRLA, EOMI,   Neck Supple, mid-line trachea,   Pulmonary Sym exp, good B air movt, CTA B, R chest port cannulated  Cardiac RRR, Nl S1, S2, no Murmurs, No rubs, No S3,S4  Vascular Vessel Right Left  Radial Faintly palpable Faintly palpable  Brachial Faintly palpable Faintly palpable  Carotid Palpable, No Bruit Palpable, No Bruit  Aorta Not palpable N/A  Femoral Palpable Palpable  Popliteal Not palpable Not palpable  PT Not palpable Not palpable  DP Faintly palpable Faintly palpable    Gastro- intestinal soft,  non-distended, non-tender to palpation, No guarding or rebound, no HSM, no masses, no CVAT B, No palpable prominent aortic pulse,   Musculo- skeletal M/S 5/5 throughout except RUE 4/5, hand grip 3-4/5 , Extremities without ischemic changes   Neurologic Cranial nerves 2-12 intact , Pain and light touch intact in extremities , Motor exam as listed above  Psychiatric Judgement intact, Mood & affect appropriate for pt's clinical situation  Dermatologic See M/S exam for extremity exam, No rashes otherwise noted  Lymphatic  Palpable lymph nodes: None    Laboratory   CBC    Latest Ref Rng & Units 06/14/2022   11:32 AM 06/14/2022    5:06 AM 06/13/2022    4:45 AM  CBC  WBC 4.0 - 10.5 K/uL  3.0  2.9   Hemoglobin 13.0 - 17.0 g/dL 11.0  9.2  9.4   Hematocrit 39.0 - 52.0 % 33.6  28.0  28.5   Platelets 150 - 400 K/uL  126  127     BMP    Latest Ref Rng & Units 06/14/2022    5:06 AM 06/13/2022    4:45 AM 06/12/2022    4:56 PM  BMP  Glucose 70 - 99 mg/dL 91  93  101   BUN 8 - 23 mg/dL '12  12  13   '$ Creatinine 0.61 - 1.24 mg/dL 0.72  0.68  0.75   Sodium 135 - 145 mmol/L 140  138  140   Potassium 3.5 - 5.1 mmol/L 3.7  3.9  4.2   Chloride 98 - 111 mmol/L 112  111  108   CO2 22 - 32 mmol/L '25  24  28   '$ Calcium 8.9 - 10.3 mg/dL 7.9  7.9  8.4     Coagulation Lab Results  Component Value Date   INR 1.2 06/14/2022   INR 1.2 06/12/2022   INR 1.2 04/04/2021   No results found for: "PTT"  Lipids    Component Value Date/Time   CHOL 126 06/13/2022 0445   TRIG 36 06/13/2022 0445   HDL 40 (L) 06/13/2022 0445   CHOLHDL 3.2 06/13/2022 0445   VLDL 7 06/13/2022 0445   LDLCALC 79 06/13/2022 0445     Radiology     ECHOCARDIOGRAM COMPLETE BUBBLE STUDY  Result Date: 06/14/2022    ECHOCARDIOGRAM REPORT   Patient Name:   Oscar [name] Date of Exam: 06/14/2022 Medical Rec #:  [id number]        Height:       72.0 in Accession #:    [id number]       Weight:       152.4 lb Date of Birth:  May 08, 1941        BSA:          1.898 m Patient Age:    43 years         BP:           112/64 mmHg Patient Gender: M                HR:           53 bpm. Exam Location:  Inpatient Procedure: 2D Echo, Cardiac Doppler and Color Doppler Indications:     Stroke I63.9  History:         Patient has no prior history of Echocardiogram examinations.  Sonographer:     Darlina Sicilian RDCS Referring Phys:  [id number] JAN A MANSY Diagnosing Phys: Yolonda Kida MD IMPRESSIONS  1. Left ventricular ejection fraction, by estimation, is 65 to 70%. The left ventricle has normal function. The left ventricle has no regional wall motion abnormalities. Left ventricular diastolic parameters are consistent with Grade I diastolic dysfunction (impaired relaxation).  2. Right ventricular systolic function is normal. The right ventricular size is normal.  3. The mitral valve is normal in structure. No evidence of mitral valve regurgitation.  4. The aortic valve is normal in structure. Aortic valve regurgitation is not visualized. FINDINGS  Left Ventricle: Left ventricular ejection fraction, by estimation, is 65 to 70%. The left ventricle has normal function. The left ventricle has no regional wall motion abnormalities. The left ventricular internal cavity size was normal in size. There is  no left ventricular hypertrophy. Left ventricular diastolic parameters are consistent with Grade I diastolic dysfunction (impaired relaxation). Right Ventricle: The right ventricular size is normal. No increase in right ventricular wall thickness. Right ventricular systolic function is normal. Left Atrium: Left atrial size was normal in size. Right Atrium: Right atrial size was normal in size. Pericardium: There is no evidence of pericardial effusion. Mitral Valve: The mitral valve is normal in structure. No evidence of mitral valve regurgitation. Tricuspid Valve: The tricuspid valve is normal in structure. Tricuspid valve regurgitation is trivial. Aortic Valve: The  aortic valve is normal in structure. Aortic valve regurgitation is not visualized. Pulmonic Valve: The pulmonic valve was normal in structure. Pulmonic valve regurgitation is not visualized. Aorta: The ascending aorta was not well visualized. IAS/Shunts: No atrial level shunt detected by color flow Doppler.  LEFT VENTRICLE PLAX 2D LVIDd:  4.39 cm   Diastology LVIDs:         2.76 cm   LV e' medial:    3.92 cm/s LV PW:         0.89 cm   LV E/e' medial:  11.7 LV IVS:        1.06 cm   LV e' lateral:   5.77 cm/s LVOT diam:     2.20 cm   LV E/e' lateral: 7.9 LV SV:         60 LV SV Index:   32 LVOT Area:     3.80 cm  RIGHT VENTRICLE RV S prime:     11.20 cm/s TAPSE (M-mode): 2.4 cm LEFT ATRIUM           Index        RIGHT ATRIUM           Index LA diam:      2.60 cm 1.37 cm/m   RA Area:     12.00 cm LA Vol (A2C): 44.3 ml 23.34 ml/m  RA Volume:   29.80 ml  15.70 ml/m LA Vol (A4C): 22.4 ml 11.80 ml/m  AORTIC VALVE LVOT Vmax:   79.00 cm/s LVOT Vmean:  53.000 cm/s LVOT VTI:    0.158 m  AORTA Ao Root diam: 3.50 cm MITRAL VALVE MV Area (PHT): 1.48 cm    SHUNTS MV Decel Time: 512 msec    Systemic VTI:  0.16 m MV E velocity: 45.80 cm/s  Systemic Diam: 2.20 cm MV A velocity: 73.70 cm/s MV E/A ratio:  0.62 Oscar D Callwood MD Electronically signed by Yolonda Kida MD Signature Date/Time: 06/14/2022/4:47:10 PM    Final    CT ANGIO NECK W OR WO CONTRAST  Addendum Date: 06/14/2022   ADDENDUM REPORT: 06/14/2022 11:10 ADDENDUM: Study discussed by telephone with Dr. Frederich Chick LAMA on 06/14/2022 at 1101 hours. Electronically Signed   By: Genevie Ann M.D.   On: 06/14/2022 11:10   Result Date: 06/14/2022 CLINICAL DATA:  81 year old male with small vessel infarcts in the left basal ganglia and left occipital lobe white matter on brain MRI 2 days ago. Metastatic colon cancer. EXAM: CT ANGIOGRAPHY HEAD AND NECK TECHNIQUE: Multidetector CT imaging of the head and neck was performed using the standard protocol during bolus  administration of intravenous contrast. Multiplanar CT image reconstructions and MIPs were obtained to evaluate the vascular anatomy. Carotid stenosis measurements (when applicable) are obtained utilizing NASCET criteria, using the distal internal carotid diameter as the denominator. RADIATION DOSE REDUCTION: This exam was performed according to the departmental dose-optimization program which includes automated exposure control, adjustment of the mA and/or kV according to patient size and/or use of iterative reconstruction technique. CONTRAST:  161m OMNIPAQUE IOHEXOL 350 MG/ML SOLN COMPARISON:  Head CT and brain MRI 03/12/2022. Restaging CT Chest, Abdomen, and Pelvis 04/16/2022 FINDINGS: CT HEAD Brain: Calcified atherosclerosis at the skull base. No acute intracranial hemorrhage identified. No midline shift, mass effect, or evidence of intracranial mass lesion. Lacunar infarcts in the left basal ganglia and occipital lobe white matter remain occult by CT. Stable gray-white matter differentiation throughout the brain. Chronic posterosuperior left MCA territory encephalomalacia. Chronic lacunar infarct of the left caudate. Calvarium and skull base: Negative. Paranasal sinuses: Visualized paranasal sinuses and mastoids are stable and well aerated. Orbits: Visualized orbits and scalp soft tissues are within normal limits. CTA NECK Skeleton: Absent dentition. Cervical spine degeneration with degenerative appearing ankylosis of C4-C5 facets. Bulky lower cervical endplate osteophytes with C6-C7  ankylosis. No acute osseous abnormality identified. Upper chest: Stable layering left pleural effusion. Numerous small upper lung nodules compatible with pulmonary metastatic disease appears stable since the June restaging exam. No superior mediastinal lymphadenopathy. Right chest Port-A-Cath remains in place. Other neck: No superimposed neck mass or lymphadenopathy identified. Aortic arch: Calcified aortic atherosclerosis.  Possible four vessel arch configuration, the left vertebral artery arises at or near the arch on series 5, image 180. Calcified aortic atherosclerosis. Right carotid system: Brachiocephalic artery plaque without stenosis. Bulky soft plaque in the posterior right ICA origin, partially calcified at the bulb. Subsequent origin stenosis up to 60 % with respect to the distal vessel (series 9, image 70). Right ICA remains patent. Left carotid system: Negative left CCA origins. Soft plaque in the medial left CCA before the bifurcation on series 5, image 124. No significant stenosis. Bulky soft plaque or thrombus and mild calcified plaque in the left ICA origin and bulb with subtotal occlusion, radiographic string sign stenosis (series 9, image 145). Subsequent decreased caliber of the downstream left ICA which remains patent to the skull base. Vertebral arteries: Proximal right subclavian artery plaque and tortuosity without stenosis. Normal right vertebral artery origin. Dominant right vertebral artery is patent to the skull base with no plaque or stenosis. Calcified atherosclerosis at the left vertebral artery origin which appears to be from the arch on series 8, image 182. Severe origin stenosis but the left vertebral artery remains patent. Non dominant left vertebral is patent to the skull base without additional plaque or stenosis. CTA HEAD Posterior circulation: No distal vertebral plaque or stenosis. Patent PICA origins and vertebrobasilar junction. Patent basilar artery without stenosis. SCA and PCA origins are normal. Posterior communicating arteries are diminutive or absent. Mild bilateral PCA P1 and P2 segment irregularity and stenosis greater on the left (series 10, image 67). Patent PCA branches. Anterior circulation: Both ICA siphons are patent. Moderate bilateral siphon calcified plaque. But no significant siphon stenosis. Normal ophthalmic artery origins. Patent carotid termini, MCA and ACA origins. Normal  anterior communicating artery. Bilateral ACA branches are within normal limits. Left MCA M1 segment and bifurcation are patent without stenosis. Left MCA branches are within normal limits. Right MCA M1 segment and bifurcation are patent without stenosis. Right MCA branches are within normal limits. Venous sinuses: Patent. Anatomic variants: Left vertebral artery arises directly from the arch, 4 vessel arch configuration. Mildly dominant right vertebral. Review of the MIP images confirms the above findings IMPRESSION: 1. Negative for large vessel occlusion but Positive for SUBTOTAL OCCLUSION of the Left ICA origin and bulb due to bulky soft plaque or thrombus. RADIOGRAPHIC STRING SIGN STENOSIS. 2. Up to 60% stenosis at the Right ICA origin due to bulky soft plaque. 3. 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 4. But no other significant arterial stenosis in the head or neck. Mild for age intracranial atherosclerosis. 5. Small vessel infarcts seen on MRI 2 days ago remain occult by CT. No new intracranial abnormality. 6. Stable layering left pleural effusion and pulmonary metastatic disease since June. 7. Aortic Atherosclerosis (ICD10-I70.0). Electronically Signed: By: Genevie Ann M.D. On: 06/14/2022 10:55   CT ANGIO HEAD W OR WO CONTRAST  Addendum Date: 06/14/2022   ADDENDUM REPORT: 06/14/2022 11:10 ADDENDUM: Study discussed by telephone with Dr. Frederich Chick LAMA on 06/14/2022 at 1101 hours. Electronically Signed   By: Genevie Ann M.D.   On: 06/14/2022 11:10   Result Date: 06/14/2022 CLINICAL DATA:  81 year old male with small vessel infarcts in the left basal ganglia and left occipital lobe white matter on brain MRI 2 days ago. Metastatic colon cancer. EXAM: CT ANGIOGRAPHY HEAD AND NECK TECHNIQUE: Multidetector CT imaging of the head and neck was performed using the standard protocol during bolus administration of intravenous contrast. Multiplanar CT image reconstructions  and MIPs were obtained to evaluate the vascular anatomy. Carotid stenosis measurements (when applicable) are obtained utilizing NASCET criteria, using the distal internal carotid diameter as the denominator. RADIATION DOSE REDUCTION: This exam was performed according to the departmental dose-optimization program which includes automated exposure control, adjustment of the mA and/or kV according to patient size and/or use of iterative reconstruction technique. CONTRAST:  159m OMNIPAQUE IOHEXOL 350 MG/ML SOLN COMPARISON:  Head CT and brain MRI 03/12/2022. Restaging CT Chest, Abdomen, and Pelvis 04/16/2022 FINDINGS: CT HEAD Brain: Calcified atherosclerosis at the skull base. No acute intracranial hemorrhage identified. No midline shift, mass effect, or evidence of intracranial mass lesion. Lacunar infarcts in the left basal ganglia and occipital lobe white matter remain occult by CT. Stable gray-white matter differentiation throughout the brain. Chronic posterosuperior left MCA territory encephalomalacia. Chronic lacunar infarct of the left caudate. Calvarium and skull base: Negative. Paranasal sinuses: Visualized paranasal sinuses and mastoids are stable and well aerated. Orbits: Visualized orbits and scalp soft tissues are within normal limits. CTA NECK Skeleton: Absent dentition. Cervical spine degeneration with degenerative appearing ankylosis of C4-C5 facets. Bulky lower cervical endplate osteophytes with C6-C7 ankylosis. No acute osseous abnormality identified. Upper chest: Stable layering left pleural effusion. Numerous small upper lung nodules compatible with pulmonary metastatic disease appears stable since the June restaging exam. No superior mediastinal lymphadenopathy. Right chest Port-A-Cath remains in place. Other neck: No superimposed neck mass or lymphadenopathy identified. Aortic arch: Calcified aortic atherosclerosis. Possible four vessel arch configuration, the left vertebral artery arises at or  near the arch on series 5, image 180. Calcified aortic atherosclerosis. Right carotid system: Brachiocephalic artery plaque without stenosis. Bulky soft plaque in the posterior right ICA origin, partially calcified at the bulb. Subsequent origin stenosis up to 60 % with respect to the distal vessel (series 9, image 70). Right ICA remains patent. Left carotid system: Negative left CCA origins. Soft plaque in the medial left CCA before the bifurcation on series 5, image 124. No significant stenosis. Bulky soft plaque or thrombus and mild calcified plaque in the left ICA origin and bulb with subtotal occlusion, radiographic string sign stenosis (series 9, image 145). Subsequent decreased caliber of the downstream left ICA which remains patent to the skull base. Vertebral arteries: Proximal right subclavian artery plaque and tortuosity without stenosis. Normal right vertebral artery origin. Dominant right vertebral artery is patent to the skull base with no plaque or stenosis. Calcified atherosclerosis at the left vertebral artery origin which appears to be from the arch on series 8, image 182. Severe origin stenosis but the left vertebral artery remains patent. Non dominant left vertebral is patent to the skull base without additional plaque or stenosis. CTA HEAD Posterior circulation: No distal vertebral plaque or stenosis. Patent PICA origins and vertebrobasilar junction. Patent basilar artery without stenosis. SCA and PCA origins are normal. Posterior communicating arteries are diminutive or absent. Mild bilateral PCA P1 and P2 segment irregularity and stenosis greater on the left (series 10, image 67). Patent PCA branches. Anterior circulation: Both ICA siphons are patent. Moderate bilateral siphon calcified plaque. But no significant siphon stenosis. Normal ophthalmic artery origins. Patent carotid termini, MCA and  ACA origins. Normal anterior communicating artery. Bilateral ACA branches are within normal limits.  Left MCA M1 segment and bifurcation are patent without stenosis. Left MCA branches are within normal limits. Right MCA M1 segment and bifurcation are patent without stenosis. Right MCA branches are within normal limits. Venous sinuses: Patent. Anatomic variants: Left vertebral artery arises directly from the arch, 4 vessel arch configuration. Mildly dominant right vertebral. Review of the MIP images confirms the above findings IMPRESSION: 1. Negative for large vessel occlusion but Positive for SUBTOTAL OCCLUSION of the Left ICA origin and bulb due to bulky soft plaque or thrombus. RADIOGRAPHIC STRING SIGN STENOSIS. 2. Up to 60% stenosis at the Right ICA origin due to bulky soft plaque. 3. 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 4. But no other significant arterial stenosis in the head or neck. Mild for age intracranial atherosclerosis. 5. Small vessel infarcts seen on MRI 2 days ago remain occult by CT. No new intracranial abnormality. 6. Stable layering left pleural effusion and pulmonary metastatic disease since June. 7. Aortic Atherosclerosis (ICD10-I70.0). Electronically Signed: By: Genevie Ann M.D. On: 06/14/2022 10:55   US Carotid Bilateral (at [facility] and [facility] only)  Result Date: 06/13/2022 CLINICAL DATA:  Stroke Syncope Hyperlipidemia Diabetes Tobacco abuse EXAM: BILATERAL CAROTID DUPLEX ULTRASOUND TECHNIQUE: Pearline Cables scale imaging, color Doppler and duplex ultrasound were performed of bilateral carotid and vertebral arteries in the neck. COMPARISON:  None available FINDINGS: Criteria: Quantification of carotid stenosis is based on velocity parameters that correlate the residual internal carotid diameter with NASCET-based stenosis levels, using the diameter of the distal internal carotid lumen as the denominator for stenosis measurement. The following velocity measurements were obtained: RIGHT ICA: 145/44 cm/sec CCA: 16/07 cm/sec SYSTOLIC ICA/CCA RATIO:  1.8 ECA:  107 cm/sec LEFT ICA: 352/100 cm/sec CCA: 37/1 cm/sec SYSTOLIC ICA/CCA RATIO:  7.3 ECA: 91 cm/sec RIGHT CAROTID ARTERY: Moderate focal heterogeneous plaque noted in the carotid bulb. RIGHT VERTEBRAL ARTERY:  Antegrade flow. LEFT CAROTID ARTERY: Extensive heterogeneous plaque of the left internal carotid artery origin. LEFT VERTEBRAL ARTERY:  Antegrade flow. IMPRESSION: 1. 50-69% stenosis of the right internal carotid artery. 2. Greater than 70% stenosis of the left carotid artery. 3. Further evaluation with CT angiography of the neck should be performed to better quantify plaque burden and degree of stenosis. Electronically Signed   By: Miachel Roux M.D.   On: 06/13/2022 15:02   MR BRAIN WO CONTRAST  Result Date: 06/12/2022 CLINICAL DATA:  Right-sided weakness EXAM: MRI HEAD WITHOUT CONTRAST TECHNIQUE: Multiplanar, multiecho pulse sequences of the brain and surrounding structures were obtained without intravenous contrast. COMPARISON:  None Available. FINDINGS: Brain: There are small foci of acute/subacute ischemia the left internal capsule posterior limb and in the left occipital lobe. No acute or chronic hemorrhage. There is multifocal hyperintense T2-weighted signal within the white matter. Generalized volume loss. Old left basal ganglia small vessel infarct. The midline structures are normal. Vascular: Major flow voids are preserved. Skull and upper cervical spine: Normal calvarium and skull base. Visualized upper cervical spine and soft tissues are normal. Sinuses/Orbits:No paranasal sinus fluid levels or advanced mucosal thickening. No mastoid or middle ear effusion. Normal orbits. IMPRESSION: Small foci of acute/subacute ischemia in the left internal capsule posterior limb and left occipital lobe. No hemorrhage or mass effect. Electronically Signed   By: Ulyses Jarred M.D.   On: 06/12/2022 21:01   CT HEAD WO CONTRAST  Result Date: 06/12/2022 CLINICAL DATA:  Neurological deficit EXAM: CT HEAD WITHOUT  CONTRAST TECHNIQUE: Contiguous axial images were obtained from the base of the skull through the vertex without intravenous contrast. RADIATION DOSE REDUCTION: This exam was performed according to the departmental dose-optimization program which includes automated exposure control, adjustment of the mA and/or kV according to patient size and/or use of iterative reconstruction technique. COMPARISON:  Head CT dated November 09, 2020 FINDINGS: Brain: Old lacunar infarcts of the bilateral basal ganglia. No evidence of acute infarction, hemorrhage, hydrocephalus, extra-axial collection or mass lesion/mass effect. Vascular: No hyperdense vessel or unexpected calcification. Skull: Normal. Negative for fracture or focal lesion. Sinuses/Orbits: No acute finding. Other: None. IMPRESSION: No acute intracranial abnormality. Electronically Signed   By: Yetta Glassman M.D.   On: 06/12/2022 17:46     I reviewed the CTA Neck: L ICA STO vs focal occlusion: should be surgically accessible   Medical Decision Making   RAINEN VANROSSUM is a 81 y.o. male who presents with: L CVA, sx L ICA STO  Based on NASCET, consideration for L CEA should be made Currently vascular surgical literature suggests better outcomes when surgery is delayed 2-4 weeks I discussed in depth with the patient the nature of atherosclerosis, and emphasized the importance of maximal medical management including strict control of blood pressure, blood glucose, and lipid levels, obtaining regular exercise, antiplatelet agents, and cessation of smoking.   The patient is currently on a statin: Lipitor.  The patient is currently get Heparin drip. Pt needs to be started on ASA or Plavix or consider DAPT. The patient is aware that without maximal medical management the underlying atherosclerotic disease process will progress, limiting the benefit of any interventions. Thank you for allowing [place] to participate in this patient's care. Dr. Lucky Cowboy will assume  care tomorrow.   Adele Barthel, MD, FACS, FSVS Covering for [facility]: [phone]  06/14/2022, 4:55 PM

## 2022-06-15 DIAGNOSIS — I639 Cerebral infarction, unspecified: Secondary | ICD-10-CM | POA: Diagnosis not present

## 2022-06-15 LAB — CBC
HCT: 27.4 % — ABNORMAL LOW (ref 39.0–52.0)
Hemoglobin: 9 g/dL — ABNORMAL LOW (ref 13.0–17.0)
MCH: 31.7 pg (ref 26.0–34.0)
MCHC: 32.8 g/dL (ref 30.0–36.0)
MCV: 96.5 fL (ref 80.0–100.0)
Platelets: 127 10*3/uL — ABNORMAL LOW (ref 150–400)
RBC: 2.84 MIL/uL — ABNORMAL LOW (ref 4.22–5.81)
RDW: 16.3 % — ABNORMAL HIGH (ref 11.5–15.5)
WBC: 3.5 10*3/uL — ABNORMAL LOW (ref 4.0–10.5)
nRBC: 0 % (ref 0.0–0.2)

## 2022-06-15 LAB — HEMOGLOBIN AND HEMATOCRIT, BLOOD
HCT: 28.2 % — ABNORMAL LOW (ref 39.0–52.0)
HCT: 28.3 % — ABNORMAL LOW (ref 39.0–52.0)
Hemoglobin: 9.1 g/dL — ABNORMAL LOW (ref 13.0–17.0)
Hemoglobin: 9.2 g/dL — ABNORMAL LOW (ref 13.0–17.0)

## 2022-06-15 LAB — HEPARIN LEVEL (UNFRACTIONATED)
Heparin Unfractionated: 0.31 IU/mL (ref 0.30–0.70)
Heparin Unfractionated: 0.47 IU/mL (ref 0.30–0.70)

## 2022-06-15 MED ORDER — HEPARIN SOD (PORK) LOCK FLUSH 100 UNIT/ML IV SOLN
500.0000 [IU] | Freq: Once | INTRAVENOUS | Status: AC
Start: 2022-06-15 — End: 2022-06-15
  Administered 2022-06-15: 500 [IU] via INTRAVENOUS
  Filled 2022-06-15: qty 5

## 2022-06-15 MED ORDER — ATORVASTATIN CALCIUM 20 MG PO TABS: 20.0000 mg | ORAL_TABLET | Freq: Every day | ORAL | 0 refills | Status: AC

## 2022-06-15 MED ORDER — CLOPIDOGREL BISULFATE 75 MG PO TABS: 75.0000 mg | ORAL_TABLET | Freq: Every day | ORAL | 11 refills | Status: AC

## 2022-06-15 MED ORDER — ASPIRIN 81 MG PO TBEC: 81.0000 mg | DELAYED_RELEASE_TABLET | Freq: Every day | ORAL | 11 refills | Status: AC

## 2022-06-15 NOTE — Progress Notes (Signed)
Occupational Therapy Treatment Patient Details Name: Oscar [name] MRN: [id number] DOB: [birth year] Today's Date: 06/15/2022   History of present illness Pt is a 81 y/o M admitted on 06/12/22 after presenting to the ED with c/o acute onset of R sided UE & LE weakness & slurred speech. Brain MRI without contrast revealed small foci of acute/subacute ischemia in the left internal capsule posterior limb and left occipital lobe with no hemorrhage or mass. PMH: dyslipidemia, prediabetes, colon CA s/p colectomy   OT comments  Oscar [name] was seen for OT treatment on this date. Upon arrival to room pt reclined in bed, agreeable to tx. Pt requires CGA + IV pole for toilet t/f, SBA urintating and hand washing in standing. Tolerates ~160 ft functional mobility with use of L rail. Pt educated on [place] theraputty issued and [facility] exercises reviewed. Pt making good progress toward goals, will continue to follow POC. Discharge recommendation remains appropriate.     Recommendations for follow up therapy are one component of a multi-disciplinary discharge planning process, led by the attending physician.  Recommendations may be updated based on patient status, additional functional criteria and insurance authorization.    Follow Up Recommendations  Home health OT    Assistance Recommended at Discharge Frequent or constant Supervision/Assistance  Patient can return home with the following  A little help with walking and/or transfers;A little help with bathing/dressing/bathroom;Help with stairs or ramp for entrance   Equipment Recommendations  BSC/3in1;Other (comment) (2WW)    Recommendations for Other Services      Precautions / Restrictions Precautions Precautions: Fall Restrictions Weight Bearing Restrictions: No       Mobility Bed Mobility Overal bed mobility: Needs Assistance Bed Mobility: Supine to Sit, Sit to Supine     Supine to sit: Supervision, HOB elevated Sit to supine:  Supervision        Transfers Overall transfer level: Needs assistance Equipment used: None Transfers: Sit to/from Stand Sit to Stand: Supervision           General transfer comment: SBA     Balance Overall balance assessment: Needs assistance Sitting-balance support: Feet supported Sitting balance-Leahy Scale: Good     Standing balance support: No upper extremity supported, During functional activity Standing balance-Leahy Scale: Fair                             ADL either performed or assessed with clinical judgement   ADL Overall ADL's : Needs assistance/impaired                                       General ADL Comments: CGA + IV pole for toilet t/f, SBA urintating and hand washing in standing. Tolerates ~160 ft functional mobility with use of L rail.      Cognition Arousal/Alertness: Awake/alert Behavior During Therapy: WFL for tasks assessed/performed Overall Cognitive Status: Within Functional Limits for tasks assessed                                          Exercises Exercises: Other exercises Other Exercises Other Exercises: HEP: [place] theraputty issued with [facility] exercises reviewed            Pertinent Vitals/ Pain       Pain  Assessment Pain Assessment: No/denies pain   Frequency  Min 5X/week        Progress Toward Goals  OT Goals(current goals can now be found in the care plan section)  Progress towards OT goals: Progressing toward goals  Acute Rehab OT Goals Patient Stated Goal: to go home OT Goal Formulation: With patient Time For Goal Achievement: 06/27/22 Potential to Achieve Goals: Good ADL Goals Pt Will Perform Grooming: with modified independence;standing Pt Will Perform Lower Body Dressing: with modified independence;sit to/from stand Pt Will Transfer to Toilet: Independently;ambulating;regular height toilet Pt/caregiver will Perform Home Exercise Program: Increased ROM;Increased  strength;Right Upper extremity  Plan Discharge plan remains appropriate;Frequency remains appropriate    Co-evaluation                 AM-PAC OT "6 Clicks" Daily Activity     Outcome Measure   Help from another person eating meals?: None Help from another person taking care of personal grooming?: A Little Help from another person toileting, which includes using toliet, bedpan, or urinal?: A Little Help from another person bathing (including washing, rinsing, drying)?: A Little Help from another person to put on and taking off regular upper body clothing?: A Little Help from another person to put on and taking off regular lower body clothing?: A Little 6 Click Score: 19    End of Session Equipment Utilized During Treatment: Gait belt  OT Visit Diagnosis: Unsteadiness on feet (R26.81);Muscle weakness (generalized) (M62.81);Hemiplegia and hemiparesis Hemiplegia - Right/Left: Right Hemiplegia - dominant/non-dominant: Dominant Hemiplegia - caused by: Cerebral infarction   Activity Tolerance Patient tolerated treatment well   Patient Left in bed;with call bell/phone within reach   Nurse Communication Mobility status        Time: 6286-3817 OT Time Calculation (min): 20 min  Charges: OT General Charges $OT Visit: 1 Visit OT Treatments $Self Care/Home Management : 8-22 mins $Therapeutic Exercise: 8-22 mins  Dessie Coma, M.S. OTR/L  06/15/22, 12:58 PM  ascom [phone]

## 2022-06-15 NOTE — Progress Notes (Signed)
Physical Therapy Treatment Patient Details Name: Oscar [name] MRN: [id number] DOB: 23-May-1941 Today's Date: 06/15/2022   History of Present Illness Pt is a 81 y/o M admitted on 06/12/22 after presenting to the ED with c/o acute onset of R sided UE & LE weakness & slurred speech. Brain MRI without contrast revealed small foci of acute/subacute ischemia in the left internal capsule posterior limb and left occipital lobe with no hemorrhage or mass. PMH: dyslipidemia, prediabetes, colon CA s/p colectomy    PT Comments    Pt seen for PT tx with pt's daughter present but exiting session. Pt is agreeable to tx & without c/o pain. Pt completes bed mobility with mod I & STS with supervision. Pt ambulates around nurses station with RW & CGA fade to supervision. Pt engaged in STS activity without BUE support with focus on equal weight bearing BLE & RLE strengthening/NMR. Pt engaged in RLE SLS with BUE fade to no UE support with focus on RLE strengthening & NMR with pt requiring up to max assist for balance. Pt endorses fatigue & returns to bed. Continue to recommend pt to use RW for gait upon d/c home & HHPT f/u.    Recommendations for follow up therapy are one component of a multi-disciplinary discharge planning process, led by the attending physician.  Recommendations may be updated based on patient status, additional functional criteria and insurance authorization.  Follow Up Recommendations  Home health PT     Assistance Recommended at Discharge Frequent or constant Supervision/Assistance  Patient can return home with the following A little help with walking and/or transfers;A little help with bathing/dressing/bathroom;Assistance with cooking/housework;Help with stairs or ramp for entrance   Equipment Recommendations  Rolling walker (2 wheels)    Recommendations for Other Services       Precautions / Restrictions Precautions Precautions: Fall Precaution Comments: R  hemi Restrictions Weight Bearing Restrictions: No     Mobility  Bed Mobility Overal bed mobility: Modified Independent       Supine to sit: Modified independent (Device/Increase time), HOB elevated Sit to supine: Modified independent (Device/Increase time), HOB elevated        Transfers Overall transfer level: Needs assistance Equipment used: None Transfers: Sit to/from Stand Sit to Stand: Supervision                Ambulation/Gait Ambulation/Gait assistance: Min guard, Supervision Gait Distance (Feet): 225 Feet Assistive device: Rolling walker (2 wheels)   Gait velocity: slightly decreased     General Gait Details: Pt with decreased endurance but able to ambulate with RW & CGA fade to supervision. Pt still demonstrates slightly decreased step width RLE.   Stairs             Wheelchair Mobility    Modified Rankin (Stroke Patients Only)       Balance Overall balance assessment: Needs assistance Sitting-balance support: Feet supported Sitting balance-Leahy Scale: Good     Standing balance support: During functional activity, Bilateral upper extremity supported Standing balance-Leahy Scale: Fair                              Cognition Arousal/Alertness: Awake/alert Behavior During Therapy: WFL for tasks assessed/performed Overall Cognitive Status: Within Functional Limits for tasks assessed  Exercises Other Exercises Other Exercises: Pt performs 5x STS without BUE support x 2 sets with CGA<>min assist with focus on BLE strengthening & equal weight bearing. Other Exercises: Pt performed RLE single leg stances with BUE fade to no UE support with up to max assist for balance with focus on RLE strengthening, NMR, & balance.    General Comments        Pertinent Vitals/Pain Pain Assessment Pain Assessment: No/denies pain    Home Living                           Prior Function            PT Goals (current goals can now be found in the care plan section) Acute Rehab PT Goals Patient Stated Goal: get better, go home PT Goal Formulation: With patient Time For Goal Achievement: 06/27/22 Potential to Achieve Goals: Good Progress towards PT goals: Progressing toward goals    Frequency    7X/week      PT Plan Current plan remains appropriate    Co-evaluation              AM-PAC PT "6 Clicks" Mobility   Outcome Measure  Help needed turning from your back to your side while in a flat bed without using bedrails?: None Help needed moving from lying on your back to sitting on the side of a flat bed without using bedrails?: None Help needed moving to and from a bed to a chair (including a wheelchair)?: A Little Help needed standing up from a chair using your arms (e.g., wheelchair or bedside chair)?: A Little Help needed to walk in hospital room?: A Little Help needed climbing 3-5 steps with a railing? : A Little 6 Click Score: 20    End of Session   Activity Tolerance: Patient tolerated treatment well;Patient limited by fatigue Patient left: in bed;with call bell/phone within reach;with bed alarm set   PT Visit Diagnosis: Unsteadiness on feet (R26.81);Muscle weakness (generalized) (M62.81);Hemiplegia and hemiparesis Hemiplegia - Right/Left: Right Hemiplegia - dominant/non-dominant: Dominant Hemiplegia - caused by: Cerebral infarction     Time: 0932-3557 PT Time Calculation (min) (ACUTE ONLY): 12 min  Charges:  $Neuromuscular Re-education: 8-22 mins                     [name], PT, DPT 06/15/22, 2:01 PM    [name] 06/15/2022, 1:59 PM

## 2022-06-15 NOTE — Progress Notes (Signed)
PROGRESS NOTE    LORNE WINKELS  VWU:[id number] DOB: [birth year] DOA: 06/12/2022 PCP: Sofie Hartigan, MD    Brief Narrative:  81 year old gentleman with history of hyperlipidemia, prediabetes, metastatic colon cancer status post colectomy and now on chemotherapy for last 3 years came to the emergency room with acute onset of right upper and lower extremity weakness and slurred speech after he woke up.  Initial CT head was normal.  MRI revealed small left MCA territory stroke.  CT angiogram with left ICA string sign.  Admitted with neurology and vascular surgery consultation.   Assessment & Plan:   Acute left MCA territory stroke: Clinical findings, right upper and lower extremity weakness and slurred speech that is already improving. CT head findings, normal on arrival. MRI of the brain, acute/subacute ischemia left internal capsule posterior limb and left occipital lobe CT angiogram head and neck, no large vessel occlusion.  Subtotal occlusion of the left ICA origin, bulky soft plaque or thrombus.  Radiographic a string sign present. 2D echocardiogram,Normal ejection fraction.  Grade 1 diastolic dysfunction.  No intracardiac thrombus. Antiplatelet therapy, none at home.  Currently on heparin infusion. LDL 79, on pravastatin at home.  Currently on atorvastatin 20 mg daily.  Will resume pravastatin 40 at discharge. Hemoglobin A1c, 5.4.  No treatment indicated. Therapy recommendations, home health PT OT. Neurology following.  Left ICA stenosis: Followed by vascular surgery.  Anticipate outpatient follow-up for CEA.  Today on a heparin drip, anticipating home on dual antiplatelet therapy.  He does have metastatic colon cancer.  Essential hypertension: Blood pressure stable.  Resume home medications on discharge.  BPH: On Flomax.  Metastatic colon cancer on chemotherapy with mild pancytopenia: Stable.  Follows up with oncology outpatient.    DVT prophylaxis: Place and maintain  sequential compression device Start: 06/13/22 0942   Code Status: Full code Family Communication: Daughter at the phone Disposition Plan: Status is: Inpatient Remains inpatient appropriate because: Currently remains on heparin infusion.  Surgical plans pending.     Consultants:  Neurology Vascular surgery  Procedures:  None  Antimicrobials:  None   Subjective: Patient seen and examined.  No overnight events.  Speech is clear.  He still has some subjective weakness on the right hand but able to mobilize around well.  No other overnight events. He was wondering about surgical recommendations due to his metastatic cancer on chemo.  Objective: Vitals:   06/15/22 0028 06/15/22 0434 06/15/22 1002 06/15/22 1125  BP: 117/63 107/60 122/71 114/66  Pulse: 70 69  63  Resp: '18 17 17 '$ (!) 25  Temp: 98.1 F (36.7 C) 97.6 F (36.4 C) 97.8 F (36.6 C) 98 F (36.7 C)  TempSrc: Oral Oral Oral Oral  SpO2: 100% 99% 100% 100%  Height:        Intake/Output Summary (Last 24 hours) at 06/15/2022 1156 Last data filed at 06/15/2022 2956 Gross per 24 hour  Intake 136.76 ml  Output 225 ml  Net -88.24 ml   There were no vitals filed for this visit.  Examination:  General exam: Appears calm and comfortable  Appropriate For age.  On room air.  Looks comfortable. Respiratory system: Clear to auscultation. Respiratory effort normal.  No added sounds. Right chest wall has a port. Cardiovascular system: S1 & S2 heard, RRR.  Gastrointestinal system: Abdomen is nondistended, soft and nontender. No organomegaly or masses felt. Normal bowel sounds heard. Central nervous system: Alert and oriented.  Has mild right facial droop. Right upper and lower extremity  4/5, weaker than left side.    Data Reviewed: I have personally reviewed following labs and imaging studies  CBC: Recent Labs  Lab 06/12/22 1656 06/13/22 0445 06/14/22 0506 06/14/22 1132 06/14/22 1702 06/14/22 2307 06/15/22 0511  06/15/22 1108  WBC 3.1* 2.9* 3.0*  --   --   --  3.5*  --   NEUTROABS 1.8  --   --   --   --   --   --   --   HGB 10.6* 9.4* 9.2* 11.0* 10.2* 8.7* 9.0* 9.1*  HCT 33.5* 28.5* 28.0* 33.6* 31.1* 26.2* 27.4* 28.2*  MCV 100.3* 96.3 96.9  --   --   --  96.5  --   PLT 180 127* 126*  --   --   --  127*  --    Basic Metabolic Panel: Recent Labs  Lab 06/12/22 1656 06/13/22 0445 06/14/22 0506  NA 140 138 140  K 4.2 3.9 3.7  CL 108 111 112*  CO2 '28 24 25  '$ GLUCOSE 101* 93 91  BUN '13 12 12  '$ CREATININE 0.75 0.68 0.72  CALCIUM 8.4* 7.9* 7.9*   GFR: Estimated Creatinine Clearance: 74.4 mL/min (by C-G formula based on SCr of 0.72 mg/dL). Liver Function Tests: Recent Labs  Lab 06/12/22 1656  AST 32  ALT 22  ALKPHOS 156*  BILITOT 0.9  PROT 7.1  ALBUMIN 3.0*   No results for input(s): "LIPASE", "AMYLASE" in the last 168 hours. No results for input(s): "AMMONIA" in the last 168 hours. Coagulation Profile: Recent Labs  Lab 06/12/22 1656 06/14/22 1132  INR 1.2 1.2   Cardiac Enzymes: No results for input(s): "CKTOTAL", "CKMB", "CKMBINDEX", "TROPONINI" in the last 168 hours. BNP (last 3 results) No results for input(s): "PROBNP" in the last 8760 hours. HbA1C: Recent Labs    06/12/22 1656  HGBA1C 5.4   CBG: Recent Labs  Lab 06/12/22 1641  GLUCAP 84   Lipid Profile: Recent Labs    06/13/22 0445  CHOL 126  HDL 40*  LDLCALC 79  TRIG 36  CHOLHDL 3.2   Thyroid Function Tests: No results for input(s): "TSH", "T4TOTAL", "FREET4", "T3FREE", "THYROIDAB" in the last 72 hours. Anemia Panel: No results for input(s): "VITAMINB12", "FOLATE", "FERRITIN", "TIBC", "IRON", "RETICCTPCT" in the last 72 hours. Sepsis Labs: No results for input(s): "PROCALCITON", "LATICACIDVEN" in the last 168 hours.  No results found for this or any previous visit (from the past 240 hour(s)).       Radiology Studies: ECHOCARDIOGRAM COMPLETE BUBBLE STUDY  Result Date: 06/14/2022     ECHOCARDIOGRAM REPORT   Patient Name:   ABRON NEDDO Date of Exam: 06/14/2022 Medical Rec #:  [id number]        Height:       72.0 in Accession #:    [id number]       Weight:       152.4 lb Date of Birth:  24-Jan-1941       BSA:          1.898 m Patient Age:    59 years         BP:           112/64 mmHg Patient Gender: M                HR:           53 bpm. Exam Location:  Inpatient Procedure: 2D Echo, Cardiac Doppler and Color Doppler Indications:     Stroke I63.9  History:  Patient has no prior history of Echocardiogram examinations.  Sonographer:     Darlina Sicilian RDCS Referring Phys:  [id number] JAN A MANSY Diagnosing Phys: Yolonda Kida MD IMPRESSIONS  1. Left ventricular ejection fraction, by estimation, is 65 to 70%. The left ventricle has normal function. The left ventricle has no regional wall motion abnormalities. Left ventricular diastolic parameters are consistent with Grade I diastolic dysfunction (impaired relaxation).  2. Right ventricular systolic function is normal. The right ventricular size is normal.  3. The mitral valve is normal in structure. No evidence of mitral valve regurgitation.  4. The aortic valve is normal in structure. Aortic valve regurgitation is not visualized. FINDINGS  Left Ventricle: Left ventricular ejection fraction, by estimation, is 65 to 70%. The left ventricle has normal function. The left ventricle has no regional wall motion abnormalities. The left ventricular internal cavity size was normal in size. There is  no left ventricular hypertrophy. Left ventricular diastolic parameters are consistent with Grade I diastolic dysfunction (impaired relaxation). Right Ventricle: The right ventricular size is normal. No increase in right ventricular wall thickness. Right ventricular systolic function is normal. Left Atrium: Left atrial size was normal in size. Right Atrium: Right atrial size was normal in size. Pericardium: There is no evidence of pericardial effusion.  Mitral Valve: The mitral valve is normal in structure. No evidence of mitral valve regurgitation. Tricuspid Valve: The tricuspid valve is normal in structure. Tricuspid valve regurgitation is trivial. Aortic Valve: The aortic valve is normal in structure. Aortic valve regurgitation is not visualized. Pulmonic Valve: The pulmonic valve was normal in structure. Pulmonic valve regurgitation is not visualized. Aorta: The ascending aorta was not well visualized. IAS/Shunts: No atrial level shunt detected by color flow Doppler.  LEFT VENTRICLE PLAX 2D LVIDd:         4.39 cm   Diastology LVIDs:         2.76 cm   LV e' medial:    3.92 cm/s LV PW:         0.89 cm   LV E/e' medial:  11.7 LV IVS:        1.06 cm   LV e' lateral:   5.77 cm/s LVOT diam:     2.20 cm   LV E/e' lateral: 7.9 LV SV:         60 LV SV Index:   32 LVOT Area:     3.80 cm  RIGHT VENTRICLE RV S prime:     11.20 cm/s TAPSE (M-mode): 2.4 cm LEFT ATRIUM           Index        RIGHT ATRIUM           Index LA diam:      2.60 cm 1.37 cm/m   RA Area:     12.00 cm LA Vol (A2C): 44.3 ml 23.34 ml/m  RA Volume:   29.80 ml  15.70 ml/m LA Vol (A4C): 22.4 ml 11.80 ml/m  AORTIC VALVE LVOT Vmax:   79.00 cm/s LVOT Vmean:  53.000 cm/s LVOT VTI:    0.158 m  AORTA Ao Root diam: 3.50 cm MITRAL VALVE MV Area (PHT): 1.48 cm    SHUNTS MV Decel Time: 512 msec    Systemic VTI:  0.16 m MV E velocity: 45.80 cm/s  Systemic Diam: 2.20 cm MV A velocity: 73.70 cm/s MV E/A ratio:  0.62 Dwayne D Callwood MD Electronically signed by Yolonda Kida MD Signature Date/Time: 06/14/2022/4:47:10 PM  Final    CT ANGIO NECK W OR WO CONTRAST  Addendum Date: 06/14/2022   ADDENDUM REPORT: 06/14/2022 11:10 ADDENDUM: Study discussed by telephone with Dr. Frederich Chick LAMA on 06/14/2022 at 1101 hours. Electronically Signed   By: Genevie Ann M.D.   On: 06/14/2022 11:10   Result Date: 06/14/2022 CLINICAL DATA:  81 year old male with small vessel infarcts in the left basal ganglia and left occipital  lobe white matter on brain MRI 2 days ago. Metastatic colon cancer. EXAM: CT ANGIOGRAPHY HEAD AND NECK TECHNIQUE: Multidetector CT imaging of the head and neck was performed using the standard protocol during bolus administration of intravenous contrast. Multiplanar CT image reconstructions and MIPs were obtained to evaluate the vascular anatomy. Carotid stenosis measurements (when applicable) are obtained utilizing NASCET criteria, using the distal internal carotid diameter as the denominator. RADIATION DOSE REDUCTION: This exam was performed according to the departmental dose-optimization program which includes automated exposure control, adjustment of the mA and/or kV according to patient size and/or use of iterative reconstruction technique. CONTRAST:  145m OMNIPAQUE IOHEXOL 350 MG/ML SOLN COMPARISON:  Head CT and brain MRI 03/12/2022. Restaging CT Chest, Abdomen, and Pelvis 04/16/2022 FINDINGS: CT HEAD Brain: Calcified atherosclerosis at the skull base. No acute intracranial hemorrhage identified. No midline shift, mass effect, or evidence of intracranial mass lesion. Lacunar infarcts in the left basal ganglia and occipital lobe white matter remain occult by CT. Stable gray-white matter differentiation throughout the brain. Chronic posterosuperior left MCA territory encephalomalacia. Chronic lacunar infarct of the left caudate. Calvarium and skull base: Negative. Paranasal sinuses: Visualized paranasal sinuses and mastoids are stable and well aerated. Orbits: Visualized orbits and scalp soft tissues are within normal limits. CTA NECK Skeleton: Absent dentition. Cervical spine degeneration with degenerative appearing ankylosis of C4-C5 facets. Bulky lower cervical endplate osteophytes with C6-C7 ankylosis. No acute osseous abnormality identified. Upper chest: Stable layering left pleural effusion. Numerous small upper lung nodules compatible with pulmonary metastatic disease appears stable since the June  restaging exam. No superior mediastinal lymphadenopathy. Right chest Port-A-Cath remains in place. Other neck: No superimposed neck mass or lymphadenopathy identified. Aortic arch: Calcified aortic atherosclerosis. Possible four vessel arch configuration, the left vertebral artery arises at or near the arch on series 5, image 180. Calcified aortic atherosclerosis. Right carotid system: Brachiocephalic artery plaque without stenosis. Bulky soft plaque in the posterior right ICA origin, partially calcified at the bulb. Subsequent origin stenosis up to 60 % with respect to the distal vessel (series 9, image 70). Right ICA remains patent. Left carotid system: Negative left CCA origins. Soft plaque in the medial left CCA before the bifurcation on series 5, image 124. No significant stenosis. Bulky soft plaque or thrombus and mild calcified plaque in the left ICA origin and bulb with subtotal occlusion, radiographic string sign stenosis (series 9, image 145). Subsequent decreased caliber of the downstream left ICA which remains patent to the skull base. Vertebral arteries: Proximal right subclavian artery plaque and tortuosity without stenosis. Normal right vertebral artery origin. Dominant right vertebral artery is patent to the skull base with no plaque or stenosis. Calcified atherosclerosis at the left vertebral artery origin which appears to be from the arch on series 8, image 182. Severe origin stenosis but the left vertebral artery remains patent. Non dominant left vertebral is patent to the skull base without additional plaque or stenosis. CTA HEAD Posterior circulation: No distal vertebral plaque or stenosis. Patent PICA origins and vertebrobasilar junction. Patent basilar artery without stenosis. SCA and PCA  origins are normal. Posterior communicating arteries are diminutive or absent. Mild bilateral PCA P1 and P2 segment irregularity and stenosis greater on the left (series 10, image 67). Patent PCA branches.  Anterior circulation: Both ICA siphons are patent. Moderate bilateral siphon calcified plaque. But no significant siphon stenosis. Normal ophthalmic artery origins. Patent carotid termini, MCA and ACA origins. Normal anterior communicating artery. Bilateral ACA branches are within normal limits. Left MCA M1 segment and bifurcation are patent without stenosis. Left MCA branches are within normal limits. Right MCA M1 segment and bifurcation are patent without stenosis. Right MCA branches are within normal limits. Venous sinuses: Patent. Anatomic variants: Left vertebral artery arises directly from the arch, 4 vessel arch configuration. Mildly dominant right vertebral. Review of the MIP images confirms the above findings IMPRESSION: 1. Negative for large vessel occlusion but Positive for SUBTOTAL OCCLUSION of the Left ICA origin and bulb due to bulky soft plaque or thrombus. RADIOGRAPHIC STRING SIGN STENOSIS. 2. Up to 60% stenosis at the Right ICA origin due to bulky soft plaque. 3. 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 4. But no other significant arterial stenosis in the head or neck. Mild for age intracranial atherosclerosis. 5. Small vessel infarcts seen on MRI 2 days ago remain occult by CT. No new intracranial abnormality. 6. Stable layering left pleural effusion and pulmonary metastatic disease since June. 7. Aortic Atherosclerosis (ICD10-I70.0). Electronically Signed: By: Genevie Ann M.D. On: 06/14/2022 10:55   CT ANGIO HEAD W OR WO CONTRAST  Addendum Date: 06/14/2022   ADDENDUM REPORT: 06/14/2022 11:10 ADDENDUM: Study discussed by telephone with Dr. Frederich Chick LAMA on 06/14/2022 at 1101 hours. Electronically Signed   By: Genevie Ann M.D.   On: 06/14/2022 11:10   Result Date: 06/14/2022 CLINICAL DATA:  81 year old male with small vessel infarcts in the left basal ganglia and left occipital lobe white matter on brain MRI 2 days ago. Metastatic colon cancer. EXAM:  CT ANGIOGRAPHY HEAD AND NECK TECHNIQUE: Multidetector CT imaging of the head and neck was performed using the standard protocol during bolus administration of intravenous contrast. Multiplanar CT image reconstructions and MIPs were obtained to evaluate the vascular anatomy. Carotid stenosis measurements (when applicable) are obtained utilizing NASCET criteria, using the distal internal carotid diameter as the denominator. RADIATION DOSE REDUCTION: This exam was performed according to the departmental dose-optimization program which includes automated exposure control, adjustment of the mA and/or kV according to patient size and/or use of iterative reconstruction technique. CONTRAST:  163m OMNIPAQUE IOHEXOL 350 MG/ML SOLN COMPARISON:  Head CT and brain MRI 03/12/2022. Restaging CT Chest, Abdomen, and Pelvis 04/16/2022 FINDINGS: CT HEAD Brain: Calcified atherosclerosis at the skull base. No acute intracranial hemorrhage identified. No midline shift, mass effect, or evidence of intracranial mass lesion. Lacunar infarcts in the left basal ganglia and occipital lobe white matter remain occult by CT. Stable gray-white matter differentiation throughout the brain. Chronic posterosuperior left MCA territory encephalomalacia. Chronic lacunar infarct of the left caudate. Calvarium and skull base: Negative. Paranasal sinuses: Visualized paranasal sinuses and mastoids are stable and well aerated. Orbits: Visualized orbits and scalp soft tissues are within normal limits. CTA NECK Skeleton: Absent dentition. Cervical spine degeneration with degenerative appearing ankylosis of C4-C5 facets. Bulky lower cervical endplate osteophytes with C6-C7 ankylosis. No acute osseous abnormality identified. Upper chest: Stable layering left pleural effusion. Numerous small upper lung nodules compatible with pulmonary metastatic disease appears stable since the June restaging exam. No superior mediastinal  lymphadenopathy. Right chest Port-A-Cath  remains in place. Other neck: No superimposed neck mass or lymphadenopathy identified. Aortic arch: Calcified aortic atherosclerosis. Possible four vessel arch configuration, the left vertebral artery arises at or near the arch on series 5, image 180. Calcified aortic atherosclerosis. Right carotid system: Brachiocephalic artery plaque without stenosis. Bulky soft plaque in the posterior right ICA origin, partially calcified at the bulb. Subsequent origin stenosis up to 60 % with respect to the distal vessel (series 9, image 70). Right ICA remains patent. Left carotid system: Negative left CCA origins. Soft plaque in the medial left CCA before the bifurcation on series 5, image 124. No significant stenosis. Bulky soft plaque or thrombus and mild calcified plaque in the left ICA origin and bulb with subtotal occlusion, radiographic string sign stenosis (series 9, image 145). Subsequent decreased caliber of the downstream left ICA which remains patent to the skull base. Vertebral arteries: Proximal right subclavian artery plaque and tortuosity without stenosis. Normal right vertebral artery origin. Dominant right vertebral artery is patent to the skull base with no plaque or stenosis. Calcified atherosclerosis at the left vertebral artery origin which appears to be from the arch on series 8, image 182. Severe origin stenosis but the left vertebral artery remains patent. Non dominant left vertebral is patent to the skull base without additional plaque or stenosis. CTA HEAD Posterior circulation: No distal vertebral plaque or stenosis. Patent PICA origins and vertebrobasilar junction. Patent basilar artery without stenosis. SCA and PCA origins are normal. Posterior communicating arteries are diminutive or absent. Mild bilateral PCA P1 and P2 segment irregularity and stenosis greater on the left (series 10, image 67). Patent PCA branches. Anterior circulation: Both ICA siphons are patent. Moderate bilateral siphon  calcified plaque. But no significant siphon stenosis. Normal ophthalmic artery origins. Patent carotid termini, MCA and ACA origins. Normal anterior communicating artery. Bilateral ACA branches are within normal limits. Left MCA M1 segment and bifurcation are patent without stenosis. Left MCA branches are within normal limits. Right MCA M1 segment and bifurcation are patent without stenosis. Right MCA branches are within normal limits. Venous sinuses: Patent. Anatomic variants: Left vertebral artery arises directly from the arch, 4 vessel arch configuration. Mildly dominant right vertebral. Review of the MIP images confirms the above findings IMPRESSION: 1. Negative for large vessel occlusion but Positive for SUBTOTAL OCCLUSION of the Left ICA origin and bulb due to bulky soft plaque or thrombus. RADIOGRAPHIC STRING SIGN STENOSIS. 2. Up to 60% stenosis at the Right ICA origin due to bulky soft plaque. 3. 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 4. But no other significant arterial stenosis in the head or neck. Mild for age intracranial atherosclerosis. 5. Small vessel infarcts seen on MRI 2 days ago remain occult by CT. No new intracranial abnormality. 6. Stable layering left pleural effusion and pulmonary metastatic disease since June. 7. Aortic Atherosclerosis (ICD10-I70.0). Electronically Signed: By: Genevie Ann M.D. On: 06/14/2022 10:55        Scheduled Meds:  amLODipine  5 mg Oral Daily   atorvastatin  20 mg Oral Daily   Chlorhexidine Gluconate Cloth  6 each Topical Daily   Continuous Infusions:  heparin 1,000 Units/hr (06/14/22 2126)     LOS: 1 day    Time spent: 35 minutes    Barb Merino, MD [facility] Pager [phone]

## 2022-06-15 NOTE — Discharge Summary (Signed)
Physician Discharge Summary  Oscar [name] KDX:[id number] DOB: [birth year] DOA: 06/12/2022  PCP: Sofie Hartigan, MD  Admit date: 06/12/2022 Discharge date: 06/15/2022  Admitted From: Home Disposition: Home with home health  Recommendations for Outpatient Follow-up:  Follow up with PCP in 1-2 weeks Vascular surgery will schedule follow-up  Home Health: PT/OT Equipment/Devices: None  Discharge Condition: Stable CODE STATUS: Full code Diet recommendation: Low-salt diet  Discharge summary: 81 year old gentleman with history of hyperlipidemia, prediabetes, metastatic colon cancer status post colectomy and now on chemotherapy for last 3 years came to the emergency room with acute onset of right upper and lower extremity weakness and slurred speech after he woke up.  Initial CT head was normal.  MRI revealed small left MCA territory stroke.  CT angiogram with left ICA string sign.  Admitted with neurology and vascular surgery consultation.   # Acute left MCA territory stroke with left ICA stenosis: Clinical findings, right upper and lower extremity weakness and slurred speech that is already improving. CT head findings, normal on arrival. MRI of the brain, acute/subacute ischemia left internal capsule posterior limb and left occipital lobe CT angiogram head and neck, no large vessel occlusion.  Subtotal occlusion of the left ICA origin, bulky soft plaque or thrombus.  Radiographic string sign present. 2D echocardiogram,Normal ejection fraction.  Grade 1 diastolic dysfunction.  No intracardiac thrombus. Antiplatelet therapy, none at home.  Treated with heparin infusion. LDL 79, not on statin at home.   Hemoglobin A1c, 5.4.  No treatment indicated. Therapy recommendations, home health PT OT. Neurology has seen the patient. With clinical improvement, stop heparin. Discharging with aspirin and Plavix, atorvastatin 20 mg daily.   Left ICA stenosis: Followed by vascular surgery.   Recommended to discharge on dual antiplatelet therapy.  They will schedule outpatient follow-up and possible CEA in 2 weeks.  He does have metastatic colon cancer.   Essential hypertension: Blood pressure stable.  He is currently not taking any blood pressure medications.  We will continue monitoring at home, not prescribing any antihypertensives at this time.   BPH: On Flomax.   Metastatic colon cancer on chemotherapy with mild pancytopenia: Stable.  Follows up with oncology outpatient.   Stable for discharge.  Discharge Diagnoses:  Principal Problem:   Acute cerebral infarction [facility]) Active Problems:   BPH (benign prostatic hyperplasia)   Essential hypertension   Dyslipidemia   GERD (gastroesophageal reflux disease)   Stroke (cerebrum) [facility])    Discharge Instructions  Discharge Instructions     Diet - low sodium heart healthy   Complete by: As directed    Increase activity slowly   Complete by: As directed       Allergies as of 06/15/2022       Reactions   Ace Inhibitors Swelling        Medication List     STOP taking these medications    amLODipine 5 MG tablet Commonly known as: NORVASC   diphenoxylate-atropine 2.5-0.025 MG tablet Commonly known as: LOMOTIL   ondansetron 8 MG tablet Commonly known as: Zofran   polyethylene glycol powder 17 GM/SCOOP powder Commonly known as: GLYCOLAX/MIRALAX   pravastatin 40 MG tablet Commonly known as: PRAVACHOL   predniSONE 20 MG tablet Commonly known as: DELTASONE   prochlorperazine 10 MG tablet Commonly known as: COMPAZINE   regorafenib 40 MG tablet Commonly known as: STIVARGA   senna 8.6 MG Tabs tablet Commonly known as: SENOKOT   sucralfate 1 g tablet Commonly known as: CARAFATE  TAKE these medications    aspirin EC 81 MG tablet Take 1 tablet (81 mg total) by mouth daily. Swallow whole.   atorvastatin 20 MG tablet Commonly known as: LIPITOR Take 1 tablet (20 mg total) by mouth  daily. Start taking on: June 16, 2022   clopidogrel 75 MG tablet Commonly known as: Plavix Take 1 tablet (75 mg total) by mouth daily.   oxyCODONE 5 MG immediate release tablet Commonly known as: Oxy IR/ROXICODONE Take 1 tablet (5 mg total) by mouth every 8 (eight) hours as needed for severe pain.   tamsulosin 0.4 MG Caps capsule Commonly known as: FLOMAX Take 1 capsule (0.4 mg total) by mouth daily.               Durable Medical Equipment  (From admission, onward)           Start     Ordered   06/13/22 1208  For home use only DME Walker rolling  Once       Question Answer Comment  Walker: With [place] Wheels   Patient needs a walker to treat with the following condition Generalized weakness      06/13/22 1208   06/13/22 0924  For home use only DME Walker rolling  Once       Question Answer Comment  Walker: With [place] Wheels   Patient needs a walker to treat with the following condition General weakness      06/13/22 0924            Follow-up Information     Dew, Erskine Squibb, MD Follow up in 1 week(s).   Specialties: Vascular Surgery, Radiology, Interventional Cardiology Contact information: [place] [address] [phone]                Allergies  Allergen Reactions   Ace Inhibitors Swelling    Consultations: Neurology Vascular surgery   Procedures/Studies: ECHOCARDIOGRAM COMPLETE BUBBLE STUDY  Result Date: 06/14/2022    ECHOCARDIOGRAM REPORT   Patient Name:   Oscar [name] Date of Exam: 06/14/2022 Medical Rec #:  [id number]        Height:       72.0 in Accession #:    [id number]       Weight:       152.4 lb Date of Birth:  24-Nov-1940       BSA:          1.898 m Patient Age:    74 years         BP:           112/64 mmHg Patient Gender: M                HR:           53 bpm. Exam Location:  Inpatient Procedure: 2D Echo, Cardiac Doppler and Color Doppler Indications:     Stroke I63.9  History:         Patient has no  prior history of Echocardiogram examinations.  Sonographer:     Darlina Sicilian RDCS Referring Phys:  [id number] JAN A MANSY Diagnosing Phys: Yolonda Kida MD IMPRESSIONS  1. Left ventricular ejection fraction, by estimation, is 65 to 70%. The left ventricle has normal function. The left ventricle has no regional wall motion abnormalities. Left ventricular diastolic parameters are consistent with Grade I diastolic dysfunction (impaired relaxation).  2. Right ventricular systolic function is normal. The right ventricular size is normal.  3. The mitral  valve is normal in structure. No evidence of mitral valve regurgitation.  4. The aortic valve is normal in structure. Aortic valve regurgitation is not visualized. FINDINGS  Left Ventricle: Left ventricular ejection fraction, by estimation, is 65 to 70%. The left ventricle has normal function. The left ventricle has no regional wall motion abnormalities. The left ventricular internal cavity size was normal in size. There is  no left ventricular hypertrophy. Left ventricular diastolic parameters are consistent with Grade I diastolic dysfunction (impaired relaxation). Right Ventricle: The right ventricular size is normal. No increase in right ventricular wall thickness. Right ventricular systolic function is normal. Left Atrium: Left atrial size was normal in size. Right Atrium: Right atrial size was normal in size. Pericardium: There is no evidence of pericardial effusion. Mitral Valve: The mitral valve is normal in structure. No evidence of mitral valve regurgitation. Tricuspid Valve: The tricuspid valve is normal in structure. Tricuspid valve regurgitation is trivial. Aortic Valve: The aortic valve is normal in structure. Aortic valve regurgitation is not visualized. Pulmonic Valve: The pulmonic valve was normal in structure. Pulmonic valve regurgitation is not visualized. Aorta: The ascending aorta was not well visualized. IAS/Shunts: No atrial level shunt detected by  color flow Doppler.  LEFT VENTRICLE PLAX 2D LVIDd:         4.39 cm   Diastology LVIDs:         2.76 cm   LV e' medial:    3.92 cm/s LV PW:         0.89 cm   LV E/e' medial:  11.7 LV IVS:        1.06 cm   LV e' lateral:   5.77 cm/s LVOT diam:     2.20 cm   LV E/e' lateral: 7.9 LV SV:         60 LV SV Index:   32 LVOT Area:     3.80 cm  RIGHT VENTRICLE RV S prime:     11.20 cm/s TAPSE (M-mode): 2.4 cm LEFT ATRIUM           Index        RIGHT ATRIUM           Index LA diam:      2.60 cm 1.37 cm/m   RA Area:     12.00 cm LA Vol (A2C): 44.3 ml 23.34 ml/m  RA Volume:   29.80 ml  15.70 ml/m LA Vol (A4C): 22.4 ml 11.80 ml/m  AORTIC VALVE LVOT Vmax:   79.00 cm/s LVOT Vmean:  53.000 cm/s LVOT VTI:    0.158 m  AORTA Ao Root diam: 3.50 cm MITRAL VALVE MV Area (PHT): 1.48 cm    SHUNTS MV Decel Time: 512 msec    Systemic VTI:  0.16 m MV E velocity: 45.80 cm/s  Systemic Diam: 2.20 cm MV A velocity: 73.70 cm/s MV E/A ratio:  0.62 Dwayne D Callwood MD Electronically signed by Yolonda Kida MD Signature Date/Time: 06/14/2022/4:47:10 PM    Final    CT ANGIO NECK W OR WO CONTRAST  Addendum Date: 06/14/2022   ADDENDUM REPORT: 06/14/2022 11:10 ADDENDUM: Study discussed by telephone with Dr. Frederich Chick LAMA on 06/14/2022 at 1101 hours. Electronically Signed   By: Genevie Ann M.D.   On: 06/14/2022 11:10   Result Date: 06/14/2022 CLINICAL DATA:  81 year old male with small vessel infarcts in the left basal ganglia and left occipital lobe white matter on brain MRI 2 days ago. Metastatic colon cancer. EXAM: CT ANGIOGRAPHY HEAD AND  NECK TECHNIQUE: Multidetector CT imaging of the head and neck was performed using the standard protocol during bolus administration of intravenous contrast. Multiplanar CT image reconstructions and MIPs were obtained to evaluate the vascular anatomy. Carotid stenosis measurements (when applicable) are obtained utilizing NASCET criteria, using the distal internal carotid diameter as the denominator. RADIATION  DOSE REDUCTION: This exam was performed according to the departmental dose-optimization program which includes automated exposure control, adjustment of the mA and/or kV according to patient size and/or use of iterative reconstruction technique. CONTRAST:  153m OMNIPAQUE IOHEXOL 350 MG/ML SOLN COMPARISON:  Head CT and brain MRI 03/12/2022. Restaging CT Chest, Abdomen, and Pelvis 04/16/2022 FINDINGS: CT HEAD Brain: Calcified atherosclerosis at the skull base. No acute intracranial hemorrhage identified. No midline shift, mass effect, or evidence of intracranial mass lesion. Lacunar infarcts in the left basal ganglia and occipital lobe white matter remain occult by CT. Stable gray-white matter differentiation throughout the brain. Chronic posterosuperior left MCA territory encephalomalacia. Chronic lacunar infarct of the left caudate. Calvarium and skull base: Negative. Paranasal sinuses: Visualized paranasal sinuses and mastoids are stable and well aerated. Orbits: Visualized orbits and scalp soft tissues are within normal limits. CTA NECK Skeleton: Absent dentition. Cervical spine degeneration with degenerative appearing ankylosis of C4-C5 facets. Bulky lower cervical endplate osteophytes with C6-C7 ankylosis. No acute osseous abnormality identified. Upper chest: Stable layering left pleural effusion. Numerous small upper lung nodules compatible with pulmonary metastatic disease appears stable since the June restaging exam. No superior mediastinal lymphadenopathy. Right chest Port-A-Cath remains in place. Other neck: No superimposed neck mass or lymphadenopathy identified. Aortic arch: Calcified aortic atherosclerosis. Possible four vessel arch configuration, the left vertebral artery arises at or near the arch on series 5, image 180. Calcified aortic atherosclerosis. Right carotid system: Brachiocephalic artery plaque without stenosis. Bulky soft plaque in the posterior right ICA origin, partially calcified at the  bulb. Subsequent origin stenosis up to 60 % with respect to the distal vessel (series 9, image 70). Right ICA remains patent. Left carotid system: Negative left CCA origins. Soft plaque in the medial left CCA before the bifurcation on series 5, image 124. No significant stenosis. Bulky soft plaque or thrombus and mild calcified plaque in the left ICA origin and bulb with subtotal occlusion, radiographic string sign stenosis (series 9, image 145). Subsequent decreased caliber of the downstream left ICA which remains patent to the skull base. Vertebral arteries: Proximal right subclavian artery plaque and tortuosity without stenosis. Normal right vertebral artery origin. Dominant right vertebral artery is patent to the skull base with no plaque or stenosis. Calcified atherosclerosis at the left vertebral artery origin which appears to be from the arch on series 8, image 182. Severe origin stenosis but the left vertebral artery remains patent. Non dominant left vertebral is patent to the skull base without additional plaque or stenosis. CTA HEAD Posterior circulation: No distal vertebral plaque or stenosis. Patent PICA origins and vertebrobasilar junction. Patent basilar artery without stenosis. SCA and PCA origins are normal. Posterior communicating arteries are diminutive or absent. Mild bilateral PCA P1 and P2 segment irregularity and stenosis greater on the left (series 10, image 67). Patent PCA branches. Anterior circulation: Both ICA siphons are patent. Moderate bilateral siphon calcified plaque. But no significant siphon stenosis. Normal ophthalmic artery origins. Patent carotid termini, MCA and ACA origins. Normal anterior communicating artery. Bilateral ACA branches are within normal limits. Left MCA M1 segment and bifurcation are patent without stenosis. Left MCA branches are within normal limits. Right  MCA M1 segment and bifurcation are patent without stenosis. Right MCA branches are within normal limits.  Venous sinuses: Patent. Anatomic variants: Left vertebral artery arises directly from the arch, 4 vessel arch configuration. Mildly dominant right vertebral. Review of the MIP images confirms the above findings IMPRESSION: 1. Negative for large vessel occlusion but Positive for SUBTOTAL OCCLUSION of the Left ICA origin and bulb due to bulky soft plaque or thrombus. RADIOGRAPHIC STRING SIGN STENOSIS. 2. Up to 60% stenosis at the Right ICA origin due to bulky soft plaque. 3. 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 4. But no other significant arterial stenosis in the head or neck. Mild for age intracranial atherosclerosis. 5. Small vessel infarcts seen on MRI 2 days ago remain occult by CT. No new intracranial abnormality. 6. Stable layering left pleural effusion and pulmonary metastatic disease since June. 7. Aortic Atherosclerosis (ICD10-I70.0). Electronically Signed: By: Genevie Ann M.D. On: 06/14/2022 10:55   CT ANGIO HEAD W OR WO CONTRAST  Addendum Date: 06/14/2022   ADDENDUM REPORT: 06/14/2022 11:10 ADDENDUM: Study discussed by telephone with Dr. Frederich Chick LAMA on 06/14/2022 at 1101 hours. Electronically Signed   By: Genevie Ann M.D.   On: 06/14/2022 11:10   Result Date: 06/14/2022 CLINICAL DATA:  81 year old male with small vessel infarcts in the left basal ganglia and left occipital lobe white matter on brain MRI 2 days ago. Metastatic colon cancer. EXAM: CT ANGIOGRAPHY HEAD AND NECK TECHNIQUE: Multidetector CT imaging of the head and neck was performed using the standard protocol during bolus administration of intravenous contrast. Multiplanar CT image reconstructions and MIPs were obtained to evaluate the vascular anatomy. Carotid stenosis measurements (when applicable) are obtained utilizing NASCET criteria, using the distal internal carotid diameter as the denominator. RADIATION DOSE REDUCTION: This exam was performed according to the departmental  dose-optimization program which includes automated exposure control, adjustment of the mA and/or kV according to patient size and/or use of iterative reconstruction technique. CONTRAST:  144m OMNIPAQUE IOHEXOL 350 MG/ML SOLN COMPARISON:  Head CT and brain MRI 03/12/2022. Restaging CT Chest, Abdomen, and Pelvis 04/16/2022 FINDINGS: CT HEAD Brain: Calcified atherosclerosis at the skull base. No acute intracranial hemorrhage identified. No midline shift, mass effect, or evidence of intracranial mass lesion. Lacunar infarcts in the left basal ganglia and occipital lobe white matter remain occult by CT. Stable gray-white matter differentiation throughout the brain. Chronic posterosuperior left MCA territory encephalomalacia. Chronic lacunar infarct of the left caudate. Calvarium and skull base: Negative. Paranasal sinuses: Visualized paranasal sinuses and mastoids are stable and well aerated. Orbits: Visualized orbits and scalp soft tissues are within normal limits. CTA NECK Skeleton: Absent dentition. Cervical spine degeneration with degenerative appearing ankylosis of C4-C5 facets. Bulky lower cervical endplate osteophytes with C6-C7 ankylosis. No acute osseous abnormality identified. Upper chest: Stable layering left pleural effusion. Numerous small upper lung nodules compatible with pulmonary metastatic disease appears stable since the June restaging exam. No superior mediastinal lymphadenopathy. Right chest Port-A-Cath remains in place. Other neck: No superimposed neck mass or lymphadenopathy identified. Aortic arch: Calcified aortic atherosclerosis. Possible four vessel arch configuration, the left vertebral artery arises at or near the arch on series 5, image 180. Calcified aortic atherosclerosis. Right carotid system: Brachiocephalic artery plaque without stenosis. Bulky soft plaque in the posterior right ICA origin, partially calcified at the bulb. Subsequent origin stenosis up to 60 % with respect to the  distal vessel (series 9, image 70). Right ICA remains patent.  Left carotid system: Negative left CCA origins. Soft plaque in the medial left CCA before the bifurcation on series 5, image 124. No significant stenosis. Bulky soft plaque or thrombus and mild calcified plaque in the left ICA origin and bulb with subtotal occlusion, radiographic string sign stenosis (series 9, image 145). Subsequent decreased caliber of the downstream left ICA which remains patent to the skull base. Vertebral arteries: Proximal right subclavian artery plaque and tortuosity without stenosis. Normal right vertebral artery origin. Dominant right vertebral artery is patent to the skull base with no plaque or stenosis. Calcified atherosclerosis at the left vertebral artery origin which appears to be from the arch on series 8, image 182. Severe origin stenosis but the left vertebral artery remains patent. Non dominant left vertebral is patent to the skull base without additional plaque or stenosis. CTA HEAD Posterior circulation: No distal vertebral plaque or stenosis. Patent PICA origins and vertebrobasilar junction. Patent basilar artery without stenosis. SCA and PCA origins are normal. Posterior communicating arteries are diminutive or absent. Mild bilateral PCA P1 and P2 segment irregularity and stenosis greater on the left (series 10, image 67). Patent PCA branches. Anterior circulation: Both ICA siphons are patent. Moderate bilateral siphon calcified plaque. But no significant siphon stenosis. Normal ophthalmic artery origins. Patent carotid termini, MCA and ACA origins. Normal anterior communicating artery. Bilateral ACA branches are within normal limits. Left MCA M1 segment and bifurcation are patent without stenosis. Left MCA branches are within normal limits. Right MCA M1 segment and bifurcation are patent without stenosis. Right MCA branches are within normal limits. Venous sinuses: Patent. Anatomic variants: Left vertebral artery  arises directly from the arch, 4 vessel arch configuration. Mildly dominant right vertebral. Review of the MIP images confirms the above findings IMPRESSION: 1. Negative for large vessel occlusion but Positive for SUBTOTAL OCCLUSION of the Left ICA origin and bulb due to bulky soft plaque or thrombus. RADIOGRAPHIC STRING SIGN STENOSIS. 2. Up to 60% stenosis at the Right ICA origin due to bulky soft plaque. 3. 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 4. But no other significant arterial stenosis in the head or neck. Mild for age intracranial atherosclerosis. 5. Small vessel infarcts seen on MRI 2 days ago remain occult by CT. No new intracranial abnormality. 6. Stable layering left pleural effusion and pulmonary metastatic disease since June. 7. Aortic Atherosclerosis (ICD10-I70.0). Electronically Signed: By: Genevie Ann M.D. On: 06/14/2022 10:55   US Carotid Bilateral (at [facility] and [facility] only)  Result Date: 06/13/2022 CLINICAL DATA:  Stroke Syncope Hyperlipidemia Diabetes Tobacco abuse EXAM: BILATERAL CAROTID DUPLEX ULTRASOUND TECHNIQUE: Pearline Cables scale imaging, color Doppler and duplex ultrasound were performed of bilateral carotid and vertebral arteries in the neck. COMPARISON:  None available FINDINGS: Criteria: Quantification of carotid stenosis is based on velocity parameters that correlate the residual internal carotid diameter with NASCET-based stenosis levels, using the diameter of the distal internal carotid lumen as the denominator for stenosis measurement. The following velocity measurements were obtained: RIGHT ICA: 145/44 cm/sec CCA: 02/40 cm/sec SYSTOLIC ICA/CCA RATIO:  1.8 ECA: 107 cm/sec LEFT ICA: 352/100 cm/sec CCA: 97/3 cm/sec SYSTOLIC ICA/CCA RATIO:  7.3 ECA: 91 cm/sec RIGHT CAROTID ARTERY: Moderate focal heterogeneous plaque noted in the carotid bulb. RIGHT VERTEBRAL ARTERY:  Antegrade flow. LEFT CAROTID ARTERY: Extensive heterogeneous plaque of the  left internal carotid artery origin. LEFT VERTEBRAL ARTERY:  Antegrade flow. IMPRESSION: 1. 50-69% stenosis of the right internal carotid artery. 2. Greater than  70% stenosis of the left carotid artery. 3. Further evaluation with CT angiography of the neck should be performed to better quantify plaque burden and degree of stenosis. Electronically Signed   By: Miachel Roux M.D.   On: 06/13/2022 15:02   MR BRAIN WO CONTRAST  Result Date: 06/12/2022 CLINICAL DATA:  Right-sided weakness EXAM: MRI HEAD WITHOUT CONTRAST TECHNIQUE: Multiplanar, multiecho pulse sequences of the brain and surrounding structures were obtained without intravenous contrast. COMPARISON:  None Available. FINDINGS: Brain: There are small foci of acute/subacute ischemia the left internal capsule posterior limb and in the left occipital lobe. No acute or chronic hemorrhage. There is multifocal hyperintense T2-weighted signal within the white matter. Generalized volume loss. Old left basal ganglia small vessel infarct. The midline structures are normal. Vascular: Major flow voids are preserved. Skull and upper cervical spine: Normal calvarium and skull base. Visualized upper cervical spine and soft tissues are normal. Sinuses/Orbits:No paranasal sinus fluid levels or advanced mucosal thickening. No mastoid or middle ear effusion. Normal orbits. IMPRESSION: Small foci of acute/subacute ischemia in the left internal capsule posterior limb and left occipital lobe. No hemorrhage or mass effect. Electronically Signed   By: Ulyses Jarred M.D.   On: 06/12/2022 21:01   CT HEAD WO CONTRAST  Result Date: 06/12/2022 CLINICAL DATA:  Neurological deficit EXAM: CT HEAD WITHOUT CONTRAST TECHNIQUE: Contiguous axial images were obtained from the base of the skull through the vertex without intravenous contrast. RADIATION DOSE REDUCTION: This exam was performed according to the departmental dose-optimization program which includes automated exposure control,  adjustment of the mA and/or kV according to patient size and/or use of iterative reconstruction technique. COMPARISON:  Head CT dated November 09, 2020 FINDINGS: Brain: Old lacunar infarcts of the bilateral basal ganglia. No evidence of acute infarction, hemorrhage, hydrocephalus, extra-axial collection or mass lesion/mass effect. Vascular: No hyperdense vessel or unexpected calcification. Skull: Normal. Negative for fracture or focal lesion. Sinuses/Orbits: No acute finding. Other: None. IMPRESSION: No acute intracranial abnormality. Electronically Signed   By: Yetta Glassman M.D.   On: 06/12/2022 17:46   (Echo, Carotid, EGD, Colonoscopy, ERCP)    Subjective: Patient seen in the evening rounds for discharge readiness.  Denies any complaints.  Eager to go home.  Mild weakness on the right side.   Discharge Exam: Vitals:   06/15/22 1125 06/15/22 1434  BP: 114/66 118/63  Pulse: 63 67  Resp: (!) 25 17  Temp: 98 F (36.7 C) 97.8 F (36.6 C)  SpO2: 100% 100%   Vitals:   06/15/22 0434 06/15/22 1002 06/15/22 1125 06/15/22 1434  BP: 107/60 122/71 114/66 118/63  Pulse: 69  63 67  Resp: 17 17 (!) 25 17  Temp: 97.6 F (36.4 C) 97.8 F (36.6 C) 98 F (36.7 C) 97.8 F (36.6 C)  TempSrc: Oral Oral Oral   SpO2: 99% 100% 100% 100%  Height:        General: Pt is alert, awake, not in acute distress On room air. Cardiovascular: RRR, S1/S2 +, no rubs, no gallops Right chest wall Port-A-Cath. Respiratory: CTA bilaterally, no wheezing, no rhonchi Abdominal: Soft, NT, ND, bowel sounds + Extremities: no edema, no cyanosis Right upper extremity has slightly lesser strength than left.    The results of significant diagnostics from this hospitalization (including imaging, microbiology, ancillary and laboratory) are listed below for reference.     Microbiology: No results found for this or any previous visit (from the past 240 hour(s)).   Labs: BNP (last 3 results)  No results for input(s):  "BNP" in the last 8760 hours. Basic Metabolic Panel: Recent Labs  Lab 06/12/22 1656 06/13/22 0445 06/14/22 0506  NA 140 138 140  K 4.2 3.9 3.7  CL 108 111 112*  CO2 '28 24 25  '$ GLUCOSE 101* 93 91  BUN '13 12 12  '$ CREATININE 0.75 0.68 0.72  CALCIUM 8.4* 7.9* 7.9*   Liver Function Tests: Recent Labs  Lab 06/12/22 1656  AST 32  ALT 22  ALKPHOS 156*  BILITOT 0.9  PROT 7.1  ALBUMIN 3.0*   No results for input(s): "LIPASE", "AMYLASE" in the last 168 hours. No results for input(s): "AMMONIA" in the last 168 hours. CBC: Recent Labs  Lab 06/12/22 1656 06/13/22 0445 06/14/22 0506 06/14/22 1132 06/14/22 1702 06/14/22 2307 06/15/22 0511 06/15/22 1108  WBC 3.1* 2.9* 3.0*  --   --   --  3.5*  --   NEUTROABS 1.8  --   --   --   --   --   --   --   HGB 10.6* 9.4* 9.2* 11.0* 10.2* 8.7* 9.0* 9.1*  HCT 33.5* 28.5* 28.0* 33.6* 31.1* 26.2* 27.4* 28.2*  MCV 100.3* 96.3 96.9  --   --   --  96.5  --   PLT 180 127* 126*  --   --   --  127*  --    Cardiac Enzymes: No results for input(s): "CKTOTAL", "CKMB", "CKMBINDEX", "TROPONINI" in the last 168 hours. BNP: Invalid input(s): "POCBNP" CBG: Recent Labs  Lab 06/12/22 1641  GLUCAP 84   D-Dimer No results for input(s): "DDIMER" in the last 72 hours. Hgb A1c Recent Labs    06/12/22 1656  HGBA1C 5.4   Lipid Profile Recent Labs    06/13/22 0445  CHOL 126  HDL 40*  LDLCALC 79  TRIG 36  CHOLHDL 3.2   Thyroid function studies No results for input(s): "TSH", "T4TOTAL", "T3FREE", "THYROIDAB" in the last 72 hours.  Invalid input(s): "FREET3" Anemia work up No results for input(s): "VITAMINB12", "FOLATE", "FERRITIN", "TIBC", "IRON", "RETICCTPCT" in the last 72 hours. Urinalysis    Component Value Date/Time   COLORURINE AMBER (A) 02/23/2022 0835   APPEARANCEUR HAZY (A) 02/23/2022 0835   LABSPEC 1.020 02/23/2022 0835   PHURINE 5.0 02/23/2022 0835   GLUCOSEU NEGATIVE 02/23/2022 0835   HGBUR SMALL (A) 02/23/2022 0835    BILIRUBINUR NEGATIVE 02/23/2022 [place] 02/23/2022 0835   PROTEINUR 30 (A) 02/23/2022 0835   NITRITE NEGATIVE 02/23/2022 0835   LEUKOCYTESUR NEGATIVE 02/23/2022 0835   Sepsis Labs Recent Labs  Lab 06/12/22 1656 06/13/22 0445 06/14/22 0506 06/15/22 0511  WBC 3.1* 2.9* 3.0* 3.5*   Microbiology No results found for this or any previous visit (from the past 240 hour(s)).   Time coordinating discharge: 35 minutes  SIGNED:   Barb Merino, MD  [facility] 06/15/2022, 4:28 PM

## 2022-06-15 NOTE — Consult Note (Signed)
ANTICOAGULATION CONSULT NOTE  Pharmacy Consult for heparin infusion Indication: Carotid Artery Stenosis/acute CVA  Patient Measurements: Height: 6' (182.9 cm) IBW/kg (Calculated) : 77.6 Heparin Dosing Weight: 69.1 kg  Labs: Recent Labs    06/12/22 1656 06/13/22 0445 06/14/22 0506 06/14/22 1132 06/14/22 1702 06/14/22 2005 06/14/22 2307 06/15/22 0511  HGB 10.6* 9.4* 9.2* 11.0* 10.2*  --  8.7* 9.0*  HCT 33.5* 28.5* 28.0* 33.6* 31.1*  --  26.2* 27.4*  PLT 180 127* 126*  --   --   --   --  127*  APTT 31  --   --   --   --   --   --   --   LABPROT 15.1  --   --  15.5*  --   --   --   --   INR 1.2  --   --  1.2  --   --   --   --   HEPARINUNFRC  --   --   --   --   --  0.16*  --  0.31  CREATININE 0.75 0.68 0.72  --   --   --   --   --      Estimated Creatinine Clearance: 74.4 mL/min (by C-G formula based on SCr of 0.72 mg/dL).   Medications:  Scheduled:   amLODipine  5 mg Oral Daily   atorvastatin  20 mg Oral Daily   Chlorhexidine Gluconate Cloth  6 each Topical Daily   Infusions:   heparin 1,000 Units/hr (06/14/22 2126)   PRN: acetaminophen **OR** acetaminophen, magnesium hydroxide, ondansetron **OR** ondansetron (ZOFRAN) IV, mouth rinse, oxyCODONE, sodium chloride flush, traZODone  Assessment: Oscar [name] is a 81 y.o. male with PMH significant for HLD, prediabetes, colon cancer s/p colectomy. Patient presented to the ED with acute right upper and lower extremity weakness and slurred speech. He was found to have acute CVA. 8/26 US carotid: 50-69% R carotid stenosis, >70% L carotid stenosis. 8/27 CTA neck: subtotal occlusion L-ICA, up to 60% stenosis R-ICA. Pharmacy consulted to initiate and manage heparin infusion per stroke protocol.    Baseline Labs: aPTT 31, PT 15.1, INR 1.2, Hgb 10.6, Hct 33.5, Plt 180   Goal of Therapy:  Heparin level 0.3 - 0.5 units/ml Monitor platelets by anticoagulation protocol: Yes  Date Time HL Rate/Comment   8/27  2005  0.16   SUBtherapeutic 8/28  0511  0.31  therapeutic x 1 8/28 1118 0.47 therapeutic x 2  Plan:  Continue heparin infusion at 1,000 units/hr Check HL daily while on heparin Continue to monitor CBC daily while on heparin infusion  Thank you for allowing pharmacy to be a part of this patient's care.   Gretel Acre, PharmD PGY1 Pharmacy Resident 06/15/2022 9:00 AM

## 2022-06-15 NOTE — Consult Note (Signed)
ANTICOAGULATION CONSULT NOTE  Pharmacy Consult for IV Heparin Indication:  Carotid artery stenosis /  acute CVA  Patient Measurements: Height: 6' (182.9 cm) IBW/kg (Calculated) : 77.6 Heparin Dosing Weight: 69.1 kg  Labs: Recent Labs    06/12/22 1656 06/13/22 0445 06/14/22 0506 06/14/22 1132 06/14/22 1702 06/14/22 2005 06/14/22 2307 06/15/22 0511  HGB 10.6* 9.4* 9.2* 11.0* 10.2*  --  8.7* 9.0*  HCT 33.5* 28.5* 28.0* 33.6* 31.1*  --  26.2* 27.4*  PLT 180 127* 126*  --   --   --   --  127*  APTT 31  --   --   --   --   --   --   --   LABPROT 15.1  --   --  15.5*  --   --   --   --   INR 1.2  --   --  1.2  --   --   --   --   HEPARINUNFRC  --   --   --   --   --  0.16*  --  0.31  CREATININE 0.75 0.68 0.72  --   --   --   --   --      Estimated Creatinine Clearance: 74.4 mL/min (by C-G formula based on SCr of 0.72 mg/dL).   Medications:  No anticoagulation or antiplatelets prior to admission per my chart review  Assessment: Patient is a 81 y/o M with medical history including HLD, prediabetes, colon cancer s/p colectomy who presented to the ED with acute right upper and lower extremity weakness and slurred speech. He was found to have acute CVA. Pharmacy consulted to initiate and manage heparin infusion per stroke protocol.    Goal of Therapy:  Heparin level 0.3 - 0.5 units/ml Monitor platelets by anticoagulation protocol: Yes   8/27 2005 HL 0.16 - subtherapeutic 8/28 0511 HL 0.31 therapeutic x 1  Plan:  --Continue heparin infusion at 1,000 units/hr --Recheck HL in 8 hours to confirm --Daily CBC per protocol while on IV heparin  Renda Rolls, PharmD, [facility] 06/15/2022 6:13 AM

## 2022-06-16 ENCOUNTER — Telehealth: Payer: Self-pay | Admitting: *Deleted

## 2022-06-16 NOTE — Telephone Encounter (Signed)
All from [place] with [name] stating patient was day/chg from hospital with home health referral for Physical Therapist and occupational therapy, He has not seen his PCP in a while so she is asking if Dr B will sign orders for this. Please advise

## 2022-06-17 NOTE — Telephone Encounter (Signed)
Verbal order given  

## 2022-06-18 ENCOUNTER — Other Ambulatory Visit (HOSPITAL_COMMUNITY): Payer: Self-pay

## 2022-06-23 ENCOUNTER — Encounter (INDEPENDENT_AMBULATORY_CARE_PROVIDER_SITE_OTHER): Payer: Self-pay | Admitting: Vascular Surgery

## 2022-06-23 ENCOUNTER — Encounter: Payer: Self-pay | Admitting: Internal Medicine

## 2022-06-23 ENCOUNTER — Ambulatory Visit (INDEPENDENT_AMBULATORY_CARE_PROVIDER_SITE_OTHER): Payer: Medicare HMO | Admitting: Vascular Surgery

## 2022-06-23 ENCOUNTER — Inpatient Hospital Stay: Payer: Medicare HMO | Attending: Internal Medicine | Admitting: Internal Medicine

## 2022-06-23 ENCOUNTER — Telehealth (INDEPENDENT_AMBULATORY_CARE_PROVIDER_SITE_OTHER): Payer: Self-pay

## 2022-06-23 ENCOUNTER — Inpatient Hospital Stay: Payer: Medicare HMO

## 2022-06-23 VITALS — BP 105/64 | HR 86 | Resp 16 | Wt 153.2 lb

## 2022-06-23 DIAGNOSIS — R634 Abnormal weight loss: Secondary | ICD-10-CM | POA: Insufficient documentation

## 2022-06-23 DIAGNOSIS — Z95828 Presence of other vascular implants and grafts: Secondary | ICD-10-CM

## 2022-06-23 DIAGNOSIS — K3189 Other diseases of stomach and duodenum: Secondary | ICD-10-CM | POA: Insufficient documentation

## 2022-06-23 DIAGNOSIS — C7800 Secondary malignant neoplasm of unspecified lung: Secondary | ICD-10-CM | POA: Insufficient documentation

## 2022-06-23 DIAGNOSIS — I6523 Occlusion and stenosis of bilateral carotid arteries: Secondary | ICD-10-CM | POA: Diagnosis not present

## 2022-06-23 DIAGNOSIS — R112 Nausea with vomiting, unspecified: Secondary | ICD-10-CM | POA: Diagnosis not present

## 2022-06-23 DIAGNOSIS — C182 Malignant neoplasm of ascending colon: Secondary | ICD-10-CM

## 2022-06-23 DIAGNOSIS — C787 Secondary malignant neoplasm of liver and intrahepatic bile duct: Secondary | ICD-10-CM | POA: Diagnosis not present

## 2022-06-23 DIAGNOSIS — G893 Neoplasm related pain (acute) (chronic): Secondary | ICD-10-CM | POA: Insufficient documentation

## 2022-06-23 DIAGNOSIS — E119 Type 2 diabetes mellitus without complications: Secondary | ICD-10-CM

## 2022-06-23 DIAGNOSIS — Z7189 Other specified counseling: Secondary | ICD-10-CM

## 2022-06-23 DIAGNOSIS — I6529 Occlusion and stenosis of unspecified carotid artery: Secondary | ICD-10-CM | POA: Insufficient documentation

## 2022-06-23 DIAGNOSIS — I10 Essential (primary) hypertension: Secondary | ICD-10-CM | POA: Diagnosis not present

## 2022-06-23 DIAGNOSIS — I63232 Cerebral infarction due to unspecified occlusion or stenosis of left carotid arteries: Secondary | ICD-10-CM

## 2022-06-23 DIAGNOSIS — E785 Hyperlipidemia, unspecified: Secondary | ICD-10-CM

## 2022-06-23 LAB — COMPREHENSIVE METABOLIC PANEL
ALT: 17 U/L (ref 0–44)
AST: 34 U/L (ref 15–41)
Albumin: 2.6 g/dL — ABNORMAL LOW (ref 3.5–5.0)
Alkaline Phosphatase: 119 U/L (ref 38–126)
Anion gap: 8 (ref 5–15)
BUN: 12 mg/dL (ref 8–23)
CO2: 25 mmol/L (ref 22–32)
Calcium: 8.1 mg/dL — ABNORMAL LOW (ref 8.9–10.3)
Chloride: 104 mmol/L (ref 98–111)
Creatinine, Ser: 0.76 mg/dL (ref 0.61–1.24)
GFR, Estimated: >60 mL/min (ref >60)
Glucose, Bld: 227 mg/dL — ABNORMAL HIGH (ref 70–99)
Potassium: 3.7 mmol/L (ref 3.5–5.1)
Sodium: 137 mmol/L (ref 135–145)
Total Bilirubin: 0.5 mg/dL (ref 0.3–1.2)
Total Protein: 6.4 g/dL — ABNORMAL LOW (ref 6.5–8.1)

## 2022-06-23 LAB — CBC WITH DIFFERENTIAL/PLATELET
Abs Immature Granulocytes: 0.01 10*3/uL (ref 0.00–0.07)
Basophils Absolute: 0 10*3/uL (ref 0.0–0.1)
Basophils Relative: 2 %
Eosinophils Absolute: 0.2 10*3/uL (ref 0.0–0.5)
Eosinophils Relative: 7 %
HCT: 31.7 % — ABNORMAL LOW (ref 39.0–52.0)
Hemoglobin: 10.5 g/dL — ABNORMAL LOW (ref 13.0–17.0)
Immature Granulocytes: 0 %
Lymphocytes Relative: 18 %
Lymphs Abs: 0.5 10*3/uL — ABNORMAL LOW (ref 0.7–4.0)
MCH: 33 pg (ref 26.0–34.0)
MCHC: 33.1 g/dL (ref 30.0–36.0)
MCV: 99.7 fL (ref 80.0–100.0)
Monocytes Absolute: 0.5 10*3/uL (ref 0.1–1.0)
Monocytes Relative: 19 %
Neutro Abs: 1.5 10*3/uL — ABNORMAL LOW (ref 1.7–7.7)
Neutrophils Relative %: 54 %
Platelets: 180 10*3/uL (ref 150–400)
RBC: 3.18 MIL/uL — ABNORMAL LOW (ref 4.22–5.81)
RDW: 17.4 % — ABNORMAL HIGH (ref 11.5–15.5)
WBC: 2.7 10*3/uL — ABNORMAL LOW (ref 4.0–10.5)
nRBC: 0 % (ref 0.0–0.2)

## 2022-06-23 MED ORDER — HEPARIN SOD (PORK) LOCK FLUSH 100 UNIT/ML IV SOLN
500.0000 [IU] | Freq: Once | INTRAVENOUS | Status: AC
  Administered 2022-06-23: 500 [IU] via INTRAVENOUS
  Filled 2022-06-23: qty 5

## 2022-06-23 NOTE — Assessment & Plan Note (Signed)
blood glucose control important in reducing the progression of atherosclerotic disease. Also, involved in wound healing. On appropriate medications.  

## 2022-06-23 NOTE — Assessment & Plan Note (Signed)
The patient has a high-grade left carotid artery stenosis with a recent stroke.  He is about a week and a half out so we could look at getting him on the schedule for potentially left carotid endarterectomy or left carotid stent placement next week.  I had a long discussion with he and his family today.  I discussed the differences between carotid endarterectomy and carotid stent placement.  I discussed the reason and rationale for having this fixed due to the reduced stroke risk.  In his case, with ongoing chemotherapy as well, we will need to time his repair on his off week from chemotherapy.  To reduce his recovery as well as minimize his healing with ongoing chemotherapy, carotid stenting would seem to be preferable in his case.  His anatomy appears to be acceptable for either carotid stenting or carotid endarterectomy.  He would for carotid stenting and that will also allow [place] not to hold his antiplatelet therapy after recent stroke which should be helpful.  He and his family voiced their understanding and desire to proceed.

## 2022-06-23 NOTE — Assessment & Plan Note (Signed)
blood pressure control important in reducing the progression of atherosclerotic disease. On appropriate oral medications.  

## 2022-06-23 NOTE — Progress Notes (Addendum)
[facility] NOTE  Patient Care Team: Sofie Hartigan, MD as PCP - General (Family Medicine) Cammie Sickle, MD as Consulting Physician (Hematology and Oncology)  CHIEF COMPLAINTS/PURPOSE OF CONSULTATION: Colon cancer  #  Oncology History Overview Note  # MAY 2020- 3. 03/09/19 Liver biopsy. Microscopic examination shows malignant cells with glandular architecture consistent with adenocarcinoma. The malignant cells are positive for CK20 and CDX-2. These findings support the clinical impression of metastatic colon adenocarcinoma. 4. 03/10/19 R hemicolectomy. Tumor site cecum. Adenocarcinoma. Mucinous features present. G2. No tumor deposits. Invades visceral peritoneum. No tumor perforation. LVI present. PNI not identified. All margins uninvolved. 1/12 LNs. PT4apN1. Periappendiceal inflammation c/w resolving abscess. Microsatellite stable (MSS). [Dr.Mettu; [facility]]  # SEP 4th 2020 [compared to May 2020]  Interval increase in size of the metastases to the hepatic dome, The metastasis to the left hepatic lobe is unchanged; 2.  New subcentimeter hypoattenuating lesion in the inferior right hepatic lobe, incompletely characterized on CT. 3.  Postsurgical changes following right hemicolectomy.  # OCT 2020- FOLFOX +avastin; CT dec 22nd 2020- [compared to [facility] sep 9th 2020]-Liver- slight progression versus stable disease; CT scan SEP 4th 2021- Progressive disease-left lower lobe lung nodule 8 mm [previously 4 mm]; increase in size of the hepatic metastases by few millimeters.;  Soft tissue nodule adjacent to anastomotic site again increased by few millimeters.STOP FOLFOX; cont avastin  #  SEP 20th, 2021- FOLFIRI+ AVASTIN; HOLD FEB 2022- sec to poor tolerance [peptic ulcer]; 3/30-liver ablation-   [bland embolization of segment 2/3 liver lesion on 12/19/2020;  microwave ablation of both liver lesions.  # ?July 25th, 2022- BEV _ TAS 102.MAY 2022- s/p Liver ablation  # DEC  2022-progressive disease-lung and liver/abdomen; JAN 31st 2023-START Regorefenib.   # FEB /16-EGD [Dr.Anna-duodenal ulcer-? meloxicam]  # JAN 2022- COVID [s/p Mab infusion; pills; skin rash resolved.]  # NGS/F-ONE-MUTATED K-RAS [G]       Cancer of right colon ([place])  07/05/2019 Initial Diagnosis   Cancer of right colon ([place])   07/24/2019 - 06/25/2020 Chemotherapy   The patient had dexamethasone (DECADRON) 4 MG tablet, 8 mg, Oral, Daily, 1 of 1 cycle, Start date: --, End date: -- palonosetron (ALOXI) injection 0.25 mg, 0.25 mg, Intravenous,  Once, 23 of 25 cycles Administration: 0.25 mg (07/24/2019), 0.25 mg (08/07/2019), 0.25 mg (08/21/2019), 0.25 mg (09/04/2019), 0.25 mg (09/20/2019), 0.25 mg (10/04/2019), 0.25 mg (10/16/2019), 0.25 mg (10/30/2019), 0.25 mg (11/22/2019), 0.25 mg (12/06/2019), 0.25 mg (12/20/2019), 0.25 mg (01/03/2020), 0.25 mg (01/29/2020), 0.25 mg (02/12/2020), 0.25 mg (02/26/2020), 0.25 mg (03/11/2020), 0.25 mg (03/25/2020), 0.25 mg (04/08/2020), 0.25 mg (04/24/2020), 0.25 mg (05/08/2020), 0.25 mg (05/22/2020), 0.25 mg (06/10/2020), 0.25 mg (06/25/2020) leucovorin 800 mg in dextrose 5 % 250 mL infusion, 844 mg, Intravenous,  Once, 23 of 25 cycles Administration: 800 mg (07/24/2019), 800 mg (08/07/2019), 800 mg (08/21/2019), 800 mg (09/04/2019), 800 mg (09/20/2019), 800 mg (10/04/2019), 800 mg (10/16/2019), 800 mg (10/30/2019), 800 mg (11/22/2019), 800 mg (12/06/2019), 800 mg (12/20/2019), 800 mg (01/03/2020), 800 mg (01/29/2020), 800 mg (02/12/2020), 800 mg (02/26/2020), 800 mg (03/11/2020), 800 mg (03/25/2020), 800 mg (04/08/2020), 800 mg (04/24/2020), 800 mg (05/08/2020), 800 mg (05/22/2020), 800 mg (06/10/2020), 800 mg (06/25/2020) oxaliplatin (ELOXATIN) 180 mg in dextrose 5 % 500 mL chemo infusion, 85 mg/m2 = 180 mg, Intravenous,  Once, 23 of 25 cycles Dose modification: 178 mg (original dose 85 mg/m2, Cycle 17, Reason: Other (see comments), Comment: insurance adjusted dose ) Administration: 180 mg (07/24/2019), 180  mg  (08/07/2019), 180 mg (08/21/2019), 180 mg (09/04/2019), 180 mg (09/20/2019), 180 mg (10/04/2019), 180 mg (10/16/2019), 180 mg (10/30/2019), 180 mg (11/22/2019), 180 mg (12/06/2019), 180 mg (12/20/2019), 180 mg (01/03/2020), 180 mg (01/29/2020), 180 mg (02/12/2020), 180 mg (02/26/2020), 180 mg (03/11/2020), 180 mg (04/08/2020), 180 mg (04/24/2020), 180 mg (05/08/2020), 180 mg (05/22/2020), 180 mg (06/10/2020), 180 mg (06/25/2020) fluorouracil (ADRUCIL) 5,000 mg in sodium chloride 0.9 % 150 mL chemo infusion, 5,050 mg, Intravenous, 1 Day/Dose, 23 of 25 cycles Administration: 5,000 mg (07/24/2019), 5,000 mg (08/07/2019), 5,000 mg (08/21/2019), 5,050 mg (09/04/2019), 5,000 mg (10/04/2019), 5,000 mg (10/16/2019), 5,000 mg (11/22/2019), 5,000 mg (12/06/2019), 5,000 mg (12/20/2019), 5,000 mg (01/03/2020), 5,000 mg (01/29/2020), 5,000 mg (02/12/2020), 5,000 mg (02/26/2020), 5,000 mg (03/11/2020), 5,000 mg (03/25/2020), 5,000 mg (04/08/2020), 5,000 mg (04/24/2020), 5,000 mg (05/08/2020), 5,000 mg (05/22/2020), 5,000 mg (06/10/2020), 5,000 mg (06/25/2020) bevacizumab-bvzr (ZIRABEV) 400 mg in sodium chloride 0.9 % 100 mL chemo infusion, 5 mg/kg = 400 mg, Intravenous,  Once, 23 of 25 cycles Administration: 400 mg (08/07/2019), 400 mg (08/21/2019), 400 mg (09/04/2019), 400 mg (09/20/2019), 400 mg (10/04/2019), 400 mg (10/16/2019), 400 mg (10/30/2019), 400 mg (11/22/2019), 400 mg (12/06/2019), 400 mg (12/20/2019), 400 mg (01/03/2020), 400 mg (01/29/2020), 400 mg (02/12/2020), 400 mg (02/26/2020), 400 mg (03/11/2020), 400 mg (03/25/2020), 400 mg (04/08/2020), 400 mg (04/24/2020), 400 mg (05/08/2020), 400 mg (05/22/2020), 400 mg (06/10/2020), 400 mg (06/25/2020)  for chemotherapy treatment.    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 NTHL1 is associated with autosomal recessive NTHL1-associated polyposis, meaning Oscar [name] is a carrier of this condition and does not have it himself. VUS in RAD50 also identified on the [facility] Multi-Cancer+RNA panel. The report date is 05/11/2022.  The Multi-Cancer Panel + RNA offered by [facility] includes sequencing and/or deletion duplication testing of the following 84 genes: AIP, ALK, APC, ATM, AXIN2,BAP1,  BARD1, BLM, BMPR1A, BRCA1, BRCA2, BRIP1, CASR, CDC73, CDH1, CDK4, CDKN1B, CDKN1C, CDKN2A (p14ARF), CDKN2A (p16INK4a), CEBPA, CHEK2, CTNNA1, DICER1, DIS3L2, EGFR (c.2369C>T, p.Thr790Met variant only), EPCAM (Deletion/duplication testing only), FH, FLCN, GATA2, GPC3, GREM1 (Promoter region deletion/duplication testing only), HOXB13 (c.251G>A, p.Gly84Glu), HRAS, KIT, MAX, MEN1, MET, MITF (c.952G>A, p.Glu318Lys variant only), MLH1, MSH2, MSH3, MSH6, MUTYH, NBN, NF1, NF2, NTHL1, PALB2, PDGFRA, PHOX2B, PMS2, POLD1, POLE, POT1, PRKAR1A, PTCH1, PTEN, RAD50, RAD51C, RAD51D, RB1, RECQL4, RET, RUNX1, SDHAF2, SDHA (sequence changes only), SDHB, SDHC, SDHD, SMAD4, SMARCA4, SMARCB1, SMARCE1, STK11, SUFU, TERC, TERT, TMEM127, TP53, TSC1, TSC2, VHL, WRN and WT1.    HISTORY OF PRESENTING ILLNESS: Patient in wheelchair.  Accompanied by his daughter.  Oscar [name] 81 y.o.  male with metastatic colon cancer to the liver-s/p multiple lines of therapy -currently on FOLFIRI + Bev is here follow-up/review results of the follow-up.  In the interim patient was admitted to the hospital for stroke.  Is currently awaiting stenting in the carotids with Dr. Lucky Cowboy next week.  Patient is currently getting physical therapy.   Denies any worsening abdominal pain-however continues to take oxycodone 5 mg 1 to 2 pills a day.  No stools  normal.  No diarrhea.  No  weight loss.   Review of Systems  Constitutional:  Positive for malaise/fatigue. Negative for chills, diaphoresis and fever.  HENT:  Negative for nosebleeds and sore throat.   Eyes:  Negative for double vision.  Respiratory:  Negative for cough, hemoptysis, sputum production, shortness of breath and wheezing.   Cardiovascular:  Negative for chest pain, palpitations, orthopnea and leg swelling.  Gastrointestinal:  Negative for blood in stool, constipation, heartburn, melena, nausea and vomiting.  Genitourinary:  Negative for dysuria, frequency and urgency.  Musculoskeletal:  Positive for back pain and joint pain.  Skin: Negative.  Negative for itching and rash.  Neurological:  Positive for tingling. Negative for focal weakness, weakness and headaches.  Endo/Heme/Allergies:  Does not bruise/bleed easily.  Psychiatric/Behavioral:  Negative for depression. The patient is not nervous/anxious and does not have insomnia.      MEDICAL HISTORY:  Past Medical History:  Diagnosis Date   Arthritis    Cancer ([place])    colon cancer 02/2019 per pt    Family history of adverse reaction to anesthesia    cousin took all night to wake up from anesthesia   H/O colon cancer, stage IV    Hyperlipemia    Neuromuscular disorder ([place])    Pre-diabetes     SURGICAL HISTORY: Past Surgical History:  Procedure Laterality Date   COLON SURGERY     ESOPHAGOGASTRODUODENOSCOPY (EGD) WITH PROPOFOL N/A 12/26/2020   Procedure: ESOPHAGOGASTRODUODENOSCOPY (EGD) WITH PROPOFOL;  Surgeon: [name], MD;  Location[facility] ENDOSCOPY;  Service: Gastroenterology;  Laterality: N/A;   IR ANGIOGRAM SELECTIVE EACH ADDITIONAL VESSEL  12/19/2020   IR ANGIOGRAM SELECTIVE EACH ADDITIONAL VESSEL  03/24/2021   IR ANGIOGRAM SELECTIVE EACH ADDITIONAL VESSEL  03/24/2021   IR ANGIOGRAM SELECTIVE EACH ADDITIONAL VESSEL  04/04/2021   IR ANGIOGRAM SELECTIVE EACH ADDITIONAL VESSEL  04/04/2021   IR ANGIOGRAM SELECTIVE EACH ADDITIONAL VESSEL   04/04/2021   IR ANGIOGRAM VISCERAL SELECTIVE  12/19/2020   IR ANGIOGRAM VISCERAL SELECTIVE  03/24/2021   IR ANGIOGRAM VISCERAL SELECTIVE  04/04/2021   IR ANGIOGRAM VISCERAL SELECTIVE  04/04/2021   IR EMBO ARTERIAL NOT HEMORR HEMANG INC GUIDE ROADMAPPING  03/24/2021   IR EMBO TUMOR ORGAN ISCHEMIA INFARCT INC GUIDE ROADMAPPING  12/19/2020   IR EMBO TUMOR ORGAN ISCHEMIA INFARCT INC GUIDE ROADMAPPING  04/04/2021   IR IMAGING GUIDED PORT INSERTION  07/20/2019   IR RADIOLOGIST EVAL & MGMT  12/05/2020   IR RADIOLOGIST EVAL & MGMT  02/11/2021   IR RADIOLOGIST EVAL & MGMT  03/04/2021   IR RADIOLOGIST EVAL & MGMT  04/29/2021   IR RADIOLOGIST EVAL & MGMT  07/22/2021   IR RADIOLOGIST EVAL & MGMT  11/19/2021   IR US GUIDE VASC ACCESS RIGHT  12/19/2020   IR US GUIDE VASC ACCESS RIGHT  03/24/2021   IR US GUIDE VASC ACCESS RIGHT  04/04/2021   JOINT REPLACEMENT Left 2010   Knee   RADIOLOGY WITH ANESTHESIA N/A 01/15/2021   Procedure: CT WITH ANESTHESIA MICROWAVE ABLATION OF LIVER;  Surgeon: Criselda Peaches, MD;  Location: [facility] ORS;  Service: Anesthesiology;  Laterality: N/A;    SOCIAL HISTORY: Social History   Socioeconomic History   Marital status: Married    Spouse name: Not on file   Number of children: Not on file   Years of education: Not on file   Highest education level: Not on file  Occupational History   Not on file  Tobacco Use   Smoking status:  Former    Packs/day: 0.25    Years: 15.00    Total pack years: 3.75    Types: Cigarettes   Smokeless tobacco: Never   Tobacco comments:    1 to 2 cigarettes a day occasionally  Vaping Use   Vaping Use: Never used  Substance and Sexual Activity   Alcohol use: Yes    Comment: rarely    Drug use: No   Sexual activity: Not on file  Other Topics Concern   Not on file  Social History Narrative   Recruitment consultant retd; lives in [place]; smoking 3cig/day; [3/4 ppd x started at 9 years]; no alcohol. Son & daughter; wife dementia [waiting for  placement].    Social Determinants of Health   Financial Resource Strain: Not on file  Food Insecurity: Not on file  Transportation Needs: Not on file  Physical Activity: Not on file  Stress: Not on file  Social Connections: Not on file  Intimate Partner Violence: Not on file    FAMILY HISTORY: Family History  Problem Relation Age of Onset   Peptic Ulcer Disease Father     ALLERGIES:  is allergic to ace inhibitors.  MEDICATIONS:  Current Outpatient Medications  Medication Sig Dispense Refill   aspirin EC 81 MG tablet Take 1 tablet (81 mg total) by mouth daily. Swallow whole. 30 tablet 11   atorvastatin (LIPITOR) 20 MG tablet Take 1 tablet (20 mg total) by mouth daily. 30 tablet 0   clopidogrel (PLAVIX) 75 MG tablet Take 1 tablet (75 mg total) by mouth daily. 30 tablet 11   oxyCODONE (OXY IR/ROXICODONE) 5 MG immediate release tablet Take 1 tablet (5 mg total) by mouth every 8 (eight) hours as needed for severe pain. 60 tablet 0   tamsulosin (FLOMAX) 0.4 MG CAPS capsule Take 1 capsule (0.4 mg total) by mouth daily. (Patient not taking: Reported on 06/23/2022) 90 capsule 1   No current facility-administered medications for this visit.   Facility-Administered Medications Ordered in Other Visits  Medication Dose Route Frequency Provider Last Rate Last Admin   atropine 1 MG/ML injection            palonosetron (ALOXI) 0.25 MG/5ML injection               .  PHYSICAL EXAMINATION: ECOG PERFORMANCE STATUS: 0 - Asymptomatic  Vitals:   06/23/22 1000  BP: 122/69  Pulse: 68  Temp: 97.9 F (36.6 C)  SpO2: 100%    Filed Weights   06/23/22 1000  Weight: 153 lb (69.4 kg)     Physical Exam HENT:     Head: Normocephalic and atraumatic.     Mouth/Throat:     Pharynx: No oropharyngeal exudate.  Eyes:     Pupils: Pupils are equal, round, and reactive to light.  Cardiovascular:     Rate and Rhythm: Normal rate and regular rhythm.  Pulmonary:     Effort: No respiratory  distress.     Breath sounds: No wheezing.     Comments: Decreased breath sounds bilaterally.  No wheeze or crackles Abdominal:     General: Bowel sounds are normal. There is no distension.     Palpations: Abdomen is soft. There is no mass.     Tenderness: There is no abdominal tenderness. There is no guarding or rebound.  Musculoskeletal:        General: No tenderness. Normal range of motion.     Cervical back: Normal range of motion and neck supple.  Skin:  General: Skin is warm.  Neurological:     Mental Status: He is alert and oriented to person, place, and time.  Psychiatric:        Mood and Affect: Affect normal.    LABORATORY DATA:  I have reviewed the data as listed Lab Results  Component Value Date   WBC 2.7 (L) 06/23/2022   HGB 10.5 (L) 06/23/2022   HCT 31.7 (L) 06/23/2022   MCV 99.7 06/23/2022   PLT 180 06/23/2022   Recent Labs    06/03/22 0822 06/12/22 1656 06/13/22 0445 06/14/22 0506 06/23/22 0953  NA 139 140 138 140 137  K 4.2 4.2 3.9 3.7 3.7  CL 109 108 111 112* 104  CO2 $Re'25 28 24 25 25  'nDP$ GLUCOSE 177* 101* 93 91 227*  BUN $Re'14 13 12 12 12  'jSP$ CREATININE 0.89 0.75 0.68 0.72 0.76  CALCIUM 8.0* 8.4* 7.9* 7.9* 8.1*  GFRNONAA >60 >60 >60 >60 >60  PROT 6.8 7.1  --   --  6.4*  ALBUMIN 2.7* 3.0*  --   --  2.6*  AST 35 32  --   --  34  ALT 19 22  --   --  17  ALKPHOS 156* 156*  --   --  119  BILITOT 0.8 0.9  --   --  0.5     RADIOGRAPHIC STUDIES: I have personally reviewed the radiological images as listed and agreed with the findings in the report. ECHOCARDIOGRAM COMPLETE BUBBLE STUDY  Result Date: 06/14/2022    ECHOCARDIOGRAM REPORT   Patient Name:   Oscar [name] Date of Exam: 06/14/2022 Medical Rec #:  [id number]        Height:       72.0 in Accession #:    [id number]       Weight:       152.4 lb Date of Birth:  [birth year]       BSA:          1.898 m Patient Age:    48 years         BP:           112/64 mmHg Patient Gender: M                HR:            53 bpm. Exam Location:  Inpatient Procedure: 2D Echo, Cardiac Doppler and Color Doppler Indications:     Stroke I63.9  History:         Patient has no prior history of Echocardiogram examinations.  Sonographer:     Darlina Sicilian RDCS Referring Phys:  [id number] JAN A MANSY Diagnosing Phys: Yolonda Kida MD IMPRESSIONS  1. Left ventricular ejection fraction, by estimation, is 65 to 70%. The left ventricle has normal function. The left ventricle has no regional wall motion abnormalities. Left ventricular diastolic parameters are consistent with Grade I diastolic dysfunction (impaired relaxation).  2. Right ventricular systolic function is normal. The right ventricular size is normal.  3. The mitral valve is normal in structure. No evidence of mitral valve regurgitation.  4. The aortic valve is normal in structure. Aortic valve regurgitation is not visualized. FINDINGS  Left Ventricle: Left ventricular ejection fraction, by estimation, is 65 to 70%. The left ventricle has normal function. The left ventricle has no regional wall motion abnormalities. The left ventricular internal cavity size was normal in size. There is  no left ventricular hypertrophy. Left ventricular diastolic parameters are consistent with  Grade I diastolic dysfunction (impaired relaxation). Right Ventricle: The right ventricular size is normal. No increase in right ventricular wall thickness. Right ventricular systolic function is normal. Left Atrium: Left atrial size was normal in size. Right Atrium: Right atrial size was normal in size. Pericardium: There is no evidence of pericardial effusion. Mitral Valve: The mitral valve is normal in structure. No evidence of mitral valve regurgitation. Tricuspid Valve: The tricuspid valve is normal in structure. Tricuspid valve regurgitation is trivial. Aortic Valve: The aortic valve is normal in structure. Aortic valve regurgitation is not visualized. Pulmonic Valve: The pulmonic valve was normal in  structure. Pulmonic valve regurgitation is not visualized. Aorta: The ascending aorta was not well visualized. IAS/Shunts: No atrial level shunt detected by color flow Doppler.  LEFT VENTRICLE PLAX 2D LVIDd:         4.39 cm   Diastology LVIDs:         2.76 cm   LV e' medial:    3.92 cm/s LV PW:         0.89 cm   LV E/e' medial:  11.7 LV IVS:        1.06 cm   LV e' lateral:   5.77 cm/s LVOT diam:     2.20 cm   LV E/e' lateral: 7.9 LV SV:         60 LV SV Index:   32 LVOT Area:     3.80 cm  RIGHT VENTRICLE RV S prime:     11.20 cm/s TAPSE (M-mode): 2.4 cm LEFT ATRIUM           Index        RIGHT ATRIUM           Index LA diam:      2.60 cm 1.37 cm/m   RA Area:     12.00 cm LA Vol (A2C): 44.3 ml 23.34 ml/m  RA Volume:   29.80 ml  15.70 ml/m LA Vol (A4C): 22.4 ml 11.80 ml/m  AORTIC VALVE LVOT Vmax:   79.00 cm/s LVOT Vmean:  53.000 cm/s LVOT VTI:    0.158 m  AORTA Ao Root diam: 3.50 cm MITRAL VALVE MV Area (PHT): 1.48 cm    SHUNTS MV Decel Time: 512 msec    Systemic VTI:  0.16 m MV E velocity: 45.80 cm/s  Systemic Diam: 2.20 cm MV A velocity: 73.70 cm/s MV E/A ratio:  0.62 Dwayne D Callwood MD Electronically signed by Yolonda Kida MD Signature Date/Time: 06/14/2022/4:47:10 PM    Final    CT ANGIO NECK W OR WO CONTRAST  Addendum Date: 06/14/2022   ADDENDUM REPORT: 06/14/2022 11:10 ADDENDUM: Study discussed by telephone with Dr. Frederich Chick LAMA on 06/14/2022 at 1101 hours. Electronically Signed   By: Genevie Ann M.D.   On: 06/14/2022 11:10   Result Date: 06/14/2022 CLINICAL DATA:  81 year old male with small vessel infarcts in the left basal ganglia and left occipital lobe white matter on brain MRI 2 days ago. Metastatic colon cancer. EXAM: CT ANGIOGRAPHY HEAD AND NECK TECHNIQUE: Multidetector CT imaging of the head and neck was performed using the standard protocol during bolus administration of intravenous contrast. Multiplanar CT image reconstructions and MIPs were obtained to evaluate the vascular anatomy.  Carotid stenosis measurements (when applicable) are obtained utilizing NASCET criteria, using the distal internal carotid diameter as the denominator. RADIATION DOSE REDUCTION: This exam was performed according to the departmental dose-optimization program which includes automated exposure control, adjustment of the mA and/or kV  according to patient size and/or use of iterative reconstruction technique. CONTRAST:  155mL OMNIPAQUE IOHEXOL 350 MG/ML SOLN COMPARISON:  Head CT and brain MRI 03/12/2022. Restaging CT Chest, Abdomen, and Pelvis 04/16/2022 FINDINGS: CT HEAD Brain: Calcified atherosclerosis at the skull base. No acute intracranial hemorrhage identified. No midline shift, mass effect, or evidence of intracranial mass lesion. Lacunar infarcts in the left basal ganglia and occipital lobe white matter remain occult by CT. Stable gray-white matter differentiation throughout the brain. Chronic posterosuperior left MCA territory encephalomalacia. Chronic lacunar infarct of the left caudate. Calvarium and skull base: Negative. Paranasal sinuses: Visualized paranasal sinuses and mastoids are stable and well aerated. Orbits: Visualized orbits and scalp soft tissues are within normal limits. CTA NECK Skeleton: Absent dentition. Cervical spine degeneration with degenerative appearing ankylosis of C4-C5 facets. Bulky lower cervical endplate osteophytes with C6-C7 ankylosis. No acute osseous abnormality identified. Upper chest: Stable layering left pleural effusion. Numerous small upper lung nodules compatible with pulmonary metastatic disease appears stable since the June restaging exam. No superior mediastinal lymphadenopathy. Right chest Port-A-Cath remains in place. Other neck: No superimposed neck mass or lymphadenopathy identified. Aortic arch: Calcified aortic atherosclerosis. Possible four vessel arch configuration, the left vertebral artery arises at or near the arch on series 5, image 180. Calcified aortic  atherosclerosis. Right carotid system: Brachiocephalic artery plaque without stenosis. Bulky soft plaque in the posterior right ICA origin, partially calcified at the bulb. Subsequent origin stenosis up to 60 % with respect to the distal vessel (series 9, image 70). Right ICA remains patent. Left carotid system: Negative left CCA origins. Soft plaque in the medial left CCA before the bifurcation on series 5, image 124. No significant stenosis. Bulky soft plaque or thrombus and mild calcified plaque in the left ICA origin and bulb with subtotal occlusion, radiographic string sign stenosis (series 9, image 145). Subsequent decreased caliber of the downstream left ICA which remains patent to the skull base. Vertebral arteries: Proximal right subclavian artery plaque and tortuosity without stenosis. Normal right vertebral artery origin. Dominant right vertebral artery is patent to the skull base with no plaque or stenosis. Calcified atherosclerosis at the left vertebral artery origin which appears to be from the arch on series 8, image 182. Severe origin stenosis but the left vertebral artery remains patent. Non dominant left vertebral is patent to the skull base without additional plaque or stenosis. CTA HEAD Posterior circulation: No distal vertebral plaque or stenosis. Patent PICA origins and vertebrobasilar junction. Patent basilar artery without stenosis. SCA and PCA origins are normal. Posterior communicating arteries are diminutive or absent. Mild bilateral PCA P1 and P2 segment irregularity and stenosis greater on the left (series 10, image 67). Patent PCA branches. Anterior circulation: Both ICA siphons are patent. Moderate bilateral siphon calcified plaque. But no significant siphon stenosis. Normal ophthalmic artery origins. Patent carotid termini, MCA and ACA origins. Normal anterior communicating artery. Bilateral ACA branches are within normal limits. Left MCA M1 segment and bifurcation are patent without  stenosis. Left MCA branches are within normal limits. Right MCA M1 segment and bifurcation are patent without stenosis. Right MCA branches are within normal limits. Venous sinuses: Patent. Anatomic variants: Left vertebral artery arises directly from the arch, 4 vessel arch configuration. Mildly dominant right vertebral. Review of the MIP images confirms the above findings IMPRESSION: 1. Negative for large vessel occlusion but Positive for SUBTOTAL OCCLUSION of the Left ICA origin and bulb due to bulky soft plaque or thrombus. RADIOGRAPHIC STRING SIGN STENOSIS. 2. Up  to 60% stenosis at the Right ICA origin due to bulky soft plaque. 3. 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 4. But no other significant arterial stenosis in the head or neck. Mild for age intracranial atherosclerosis. 5. Small vessel infarcts seen on MRI 2 days ago remain occult by CT. No new intracranial abnormality. 6. Stable layering left pleural effusion and pulmonary metastatic disease since June. 7. Aortic Atherosclerosis (ICD10-I70.0). Electronically Signed: By: Genevie Ann M.D. On: 06/14/2022 10:55   CT ANGIO HEAD W OR WO CONTRAST  Addendum Date: 06/14/2022   ADDENDUM REPORT: 06/14/2022 11:10 ADDENDUM: Study discussed by telephone with Dr. Frederich Chick LAMA on 06/14/2022 at 1101 hours. Electronically Signed   By: Genevie Ann M.D.   On: 06/14/2022 11:10   Result Date: 06/14/2022 CLINICAL DATA:  81 year old male with small vessel infarcts in the left basal ganglia and left occipital lobe white matter on brain MRI 2 days ago. Metastatic colon cancer. EXAM: CT ANGIOGRAPHY HEAD AND NECK TECHNIQUE: Multidetector CT imaging of the head and neck was performed using the standard protocol during bolus administration of intravenous contrast. Multiplanar CT image reconstructions and MIPs were obtained to evaluate the vascular anatomy. Carotid stenosis measurements (when applicable) are obtained utilizing NASCET  criteria, using the distal internal carotid diameter as the denominator. RADIATION DOSE REDUCTION: This exam was performed according to the departmental dose-optimization program which includes automated exposure control, adjustment of the mA and/or kV according to patient size and/or use of iterative reconstruction technique. CONTRAST:  142mL OMNIPAQUE IOHEXOL 350 MG/ML SOLN COMPARISON:  Head CT and brain MRI 03/12/2022. Restaging CT Chest, Abdomen, and Pelvis 04/16/2022 FINDINGS: CT HEAD Brain: Calcified atherosclerosis at the skull base. No acute intracranial hemorrhage identified. No midline shift, mass effect, or evidence of intracranial mass lesion. Lacunar infarcts in the left basal ganglia and occipital lobe white matter remain occult by CT. Stable gray-white matter differentiation throughout the brain. Chronic posterosuperior left MCA territory encephalomalacia. Chronic lacunar infarct of the left caudate. Calvarium and skull base: Negative. Paranasal sinuses: Visualized paranasal sinuses and mastoids are stable and well aerated. Orbits: Visualized orbits and scalp soft tissues are within normal limits. CTA NECK Skeleton: Absent dentition. Cervical spine degeneration with degenerative appearing ankylosis of C4-C5 facets. Bulky lower cervical endplate osteophytes with C6-C7 ankylosis. No acute osseous abnormality identified. Upper chest: Stable layering left pleural effusion. Numerous small upper lung nodules compatible with pulmonary metastatic disease appears stable since the June restaging exam. No superior mediastinal lymphadenopathy. Right chest Port-A-Cath remains in place. Other neck: No superimposed neck mass or lymphadenopathy identified. Aortic arch: Calcified aortic atherosclerosis. Possible four vessel arch configuration, the left vertebral artery arises at or near the arch on series 5, image 180. Calcified aortic atherosclerosis. Right carotid system: Brachiocephalic artery plaque without  stenosis. Bulky soft plaque in the posterior right ICA origin, partially calcified at the bulb. Subsequent origin stenosis up to 60 % with respect to the distal vessel (series 9, image 70). Right ICA remains patent. Left carotid system: Negative left CCA origins. Soft plaque in the medial left CCA before the bifurcation on series 5, image 124. No significant stenosis. Bulky soft plaque or thrombus and mild calcified plaque in the left ICA origin and bulb with subtotal occlusion, radiographic string sign stenosis (series 9, image 145). Subsequent decreased caliber of the downstream left ICA which remains patent to the skull base. Vertebral arteries: Proximal right subclavian artery plaque and tortuosity without stenosis. Normal  right vertebral artery origin. Dominant right vertebral artery is patent to the skull base with no plaque or stenosis. Calcified atherosclerosis at the left vertebral artery origin which appears to be from the arch on series 8, image 182. Severe origin stenosis but the left vertebral artery remains patent. Non dominant left vertebral is patent to the skull base without additional plaque or stenosis. CTA HEAD Posterior circulation: No distal vertebral plaque or stenosis. Patent PICA origins and vertebrobasilar junction. Patent basilar artery without stenosis. SCA and PCA origins are normal. Posterior communicating arteries are diminutive or absent. Mild bilateral PCA P1 and P2 segment irregularity and stenosis greater on the left (series 10, image 67). Patent PCA branches. Anterior circulation: Both ICA siphons are patent. Moderate bilateral siphon calcified plaque. But no significant siphon stenosis. Normal ophthalmic artery origins. Patent carotid termini, MCA and ACA origins. Normal anterior communicating artery. Bilateral ACA branches are within normal limits. Left MCA M1 segment and bifurcation are patent without stenosis. Left MCA branches are within normal limits. Right MCA M1 segment  and bifurcation are patent without stenosis. Right MCA branches are within normal limits. Venous sinuses: Patent. Anatomic variants: Left vertebral artery arises directly from the arch, 4 vessel arch configuration. Mildly dominant right vertebral. Review of the MIP images confirms the above findings IMPRESSION: 1. Negative for large vessel occlusion but Positive for SUBTOTAL OCCLUSION of the Left ICA origin and bulb due to bulky soft plaque or thrombus. RADIOGRAPHIC STRING SIGN STENOSIS. 2. Up to 60% stenosis at the Right ICA origin due to bulky soft plaque. 3. 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 4. But no other significant arterial stenosis in the head or neck. Mild for age intracranial atherosclerosis. 5. Small vessel infarcts seen on MRI 2 days ago remain occult by CT. No new intracranial abnormality. 6. Stable layering left pleural effusion and pulmonary metastatic disease since June. 7. Aortic Atherosclerosis (ICD10-I70.0). Electronically Signed: By: Genevie Ann M.D. On: 06/14/2022 10:55   US Carotid Bilateral (at [facility] and [facility] only)  Result Date: 06/13/2022 CLINICAL DATA:  Stroke Syncope Hyperlipidemia Diabetes Tobacco abuse EXAM: BILATERAL CAROTID DUPLEX ULTRASOUND TECHNIQUE: Pearline Cables scale imaging, color Doppler and duplex ultrasound were performed of bilateral carotid and vertebral arteries in the neck. COMPARISON:  None available FINDINGS: Criteria: Quantification of carotid stenosis is based on velocity parameters that correlate the residual internal carotid diameter with NASCET-based stenosis levels, using the diameter of the distal internal carotid lumen as the denominator for stenosis measurement. The following velocity measurements were obtained: RIGHT ICA: 145/44 cm/sec CCA: 30/16 cm/sec SYSTOLIC ICA/CCA RATIO:  1.8 ECA: 107 cm/sec LEFT ICA: 352/100 cm/sec CCA: 01/0 cm/sec SYSTOLIC ICA/CCA RATIO:  7.3 ECA: 91 cm/sec RIGHT CAROTID ARTERY: Moderate  focal heterogeneous plaque noted in the carotid bulb. RIGHT VERTEBRAL ARTERY:  Antegrade flow. LEFT CAROTID ARTERY: Extensive heterogeneous plaque of the left internal carotid artery origin. LEFT VERTEBRAL ARTERY:  Antegrade flow. IMPRESSION: 1. 50-69% stenosis of the right internal carotid artery. 2. Greater than 70% stenosis of the left carotid artery. 3. Further evaluation with CT angiography of the neck should be performed to better quantify plaque burden and degree of stenosis. Electronically Signed   By: Miachel Roux M.D.   On: 06/13/2022 15:02   MR BRAIN WO CONTRAST  Result Date: 06/12/2022 CLINICAL DATA:  Right-sided weakness EXAM: MRI HEAD WITHOUT CONTRAST TECHNIQUE: Multiplanar, multiecho pulse sequences of the brain and surrounding structures were obtained without intravenous contrast. COMPARISON:  None Available. FINDINGS: Brain: There are small foci of acute/subacute ischemia the left internal capsule posterior limb and in the left occipital lobe. No acute or chronic hemorrhage. There is multifocal hyperintense T2-weighted signal within the white matter. Generalized volume loss. Old left basal ganglia small vessel infarct. The midline structures are normal. Vascular: Major flow voids are preserved. Skull and upper cervical spine: Normal calvarium and skull base. Visualized upper cervical spine and soft tissues are normal. Sinuses/Orbits:No paranasal sinus fluid levels or advanced mucosal thickening. No mastoid or middle ear effusion. Normal orbits. IMPRESSION: Small foci of acute/subacute ischemia in the left internal capsule posterior limb and left occipital lobe. No hemorrhage or mass effect. Electronically Signed   By: Ulyses Jarred M.D.   On: 06/12/2022 21:01   CT HEAD WO CONTRAST  Result Date: 06/12/2022 CLINICAL DATA:  Neurological deficit EXAM: CT HEAD WITHOUT CONTRAST TECHNIQUE: Contiguous axial images were obtained from the base of the skull through the vertex without intravenous  contrast. RADIATION DOSE REDUCTION: This exam was performed according to the departmental dose-optimization program which includes automated exposure control, adjustment of the mA and/or kV according to patient size and/or use of iterative reconstruction technique. COMPARISON:  Head CT dated November 09, 2020 FINDINGS: Brain: Old lacunar infarcts of the bilateral basal ganglia. No evidence of acute infarction, hemorrhage, hydrocephalus, extra-axial collection or mass lesion/mass effect. Vascular: No hyperdense vessel or unexpected calcification. Skull: Normal. Negative for fracture or focal lesion. Sinuses/Orbits: No acute finding. Other: None. IMPRESSION: No acute intracranial abnormality. Electronically Signed   By: Yetta Glassman M.D.   On: 06/12/2022 17:46     Latest Reference Range & Units Most Recent 06/09/21 08:30 06/25/21 10:24 07/07/21 09:06 07/21/21 08:32 08/04/21 08:54 08/18/21 08:31 09/01/21 08:47 09/25/21 08:29 10/28/21 10:14 11/18/21 14:20 12/08/21 09:06  CEA 0.0 - 4.7 ng/mL 301.0 (H) 12/08/21 09:06 91.6 (H) 84.1 (H) 87.6 (H) 89.1 (H) 108.0 (H) 108.0 (H) 107.0 (H) 105.0 (H) 119.0 (H) 177.0 (H) 301.0 (H)  (H): Data is abnormally high  ASSESSMENT & PLAN:   No problem-specific Assessment & Plan notes found for this encounter.  #Right-sided colon adenocarcinoma-with synchronous metastasis to liver/unresectable;  Currently on FOLFIRI+ Zira-Bev. S/p Evaluation at [facility] II opinion/reviewed the note from [facility]; and also discussed with Dr Dennison Nancy, who agrees with current treatment plan.  Status post 5 cycles of FOLFIRI +BEV- JUNE 29th, 2023- Improving appearance of metastatic disease lung nodules; pleural effusions; metastatic disease in the liver; abdominal soft tissue lesion/lymphadenopathy peritoneal disease [however still with extensive metastatic disease].   # HOLD cycle #  10 of FOLFIRI plus Bev-given the recent stroke.  We will discontinue Bev.  see below.  labs today reviewed; Marland Kitchen CEA trending  down. Improving-see above. Will repeat scans in end of sep. Will order scans today.  We will plan to restart chemotherapy in 4 weeks.  #Recent stroke [right-sided weakness]-multifactorial-agree with carotid stenting on the left.  Recommend discontinuation of Avastin. see above.   # Nausea/vomitting- sec to chemo G-1; zofran prn.STABLE.    # Right upper quadrant pain-secondary to malignancy-s/p evaluation with [name]; continue oxycodone 5 mg up to 1-2 pills a day as needed.   STABLE.   # Proteinuria: UA +100 protein: JULY 2023 protein: creatinine ratio:- 0.02- See above.   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 Discussed with patient /  daughter re: genetic counselling for family-recommend   # Mediport- functioning-STABLE.   Mon/Tues # DISPOSITION:   #  HOLD FOLFIRI +Zira-Bev today; cancel pump off on D-3; [place]-ACCESS  # follow up  On OCT 9th;  MD;labs- cbc/cmp CEA ; FOLFIRI; pump off D-3; CT CAP priro--Dr.B      All questions were answered. The patient knows to call the clinic with any problems, questions or concerns.    Cammie Sickle, MD 06/23/2022 10:18 AM

## 2022-06-23 NOTE — Assessment & Plan Note (Addendum)
#  Right-sided colon adenocarcinoma-with synchronous metastasis to liver/unresectable;  Currently on FOLFIRI+ Zira-Bev. S/p Evaluation at [facility] II opinion/reviewed the note from [facility]; and also discussed with Dr Dennison Nancy, who agrees with current treatment plan.  Status post 5 cycles of FOLFIRI +BEV- JUNE 29th, 2023- Improving appearance of metastatic disease lung nodules; pleural effusions; metastatic disease in the liver; abdominal soft tissue lesion/lymphadenopathy peritoneal disease [however still with extensive metastatic disease].   # HOLD cycle #  10 of FOLFIRI plus Bev-given the recent stroke.  We will discontinue Bev.  see below.  labs today reviewed; Marland Kitchen CEA trending down. Improving-see above. Will repeat scans in end of sep. Will order scans today.  We will plan to restart chemotherapy in 4 weeks.  #Recent stroke [right-sided weakness]-multifactorial-agree with carotid stenting on the left.  Recommend discontinuation of Avastin. see above.   # Nausea/vomitting- sec to chemo G-1; zofran prn.STABLE.    # Right upper quadrant pain-secondary to malignancy-s/p evaluation with [name]; continue oxycodone 5 mg up to 1-2 pills a day as needed.   STABLE.   # Proteinuria: UA +100 protein: JULY 2023 protein: creatinine ratio:- 0.02- See above.   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 Discussed with patient /daughter re: genetic counselling for family-recommend   # Mediport- functioning-STABLE.   Mon/Tues # DISPOSITION:   #  HOLD FOLFIRI +Zira-Bev today; cancel pump off on D-3; DE-ACCESS  # follow up  On OCT 9th;  MD;labs- cbc/cmp CEA ; FOLFIRI; pump off D-3; CT CAP priro--Dr.B

## 2022-06-23 NOTE — Progress Notes (Signed)
Patient ID: Oscar [name], male   DOB: 06-28-1941, 81 y.o.   MRN: [id number]  Chief Complaint  Patient presents with   New Patient (Initial Visit)    Consult from ER visit    HPI Oscar [name] is a 81 y.o. male.  I am asked to see the patient by Dr. Sloan Leiter for evaluation of carotid stenosis with stroke.  The patient was seen by covering physician about a week and a half ago when he came in on call with a stroke.  He still has some residual right hand weakness and discoordination from this left-sided stroke.  He underwent a CT angiogram of the neck which I have independently reviewed and shows a very high-grade stenosis of the left carotid artery.  There is some mild to moderate stenosis in the right carotid artery as well.  The patient had a previous episode about a year ago but not as severe.  He is currently undergoing chemotherapy for colon cancer and is actually scheduled for a treatment today.  He is now on aspirin, Plavix, and Lipitor and is tolerating these well.     Past Medical History:  Diagnosis Date   Arthritis    Cancer ([place])    colon cancer 02/2019 per pt    Family history of adverse reaction to anesthesia    cousin took all night to wake up from anesthesia   H/O colon cancer, stage IV    Hyperlipemia    Neuromuscular disorder ([place])    Pre-diabetes     Past Surgical History:  Procedure Laterality Date   COLON SURGERY     ESOPHAGOGASTRODUODENOSCOPY (EGD) WITH PROPOFOL N/A 12/26/2020   Procedure: ESOPHAGOGASTRODUODENOSCOPY (EGD) WITH PROPOFOL;  Surgeon: [name], MD;  Location: [facility] ENDOSCOPY;  Service: Gastroenterology;  Laterality: N/A;   IR ANGIOGRAM SELECTIVE EACH ADDITIONAL VESSEL  12/19/2020   IR ANGIOGRAM SELECTIVE EACH ADDITIONAL VESSEL  03/24/2021   IR ANGIOGRAM SELECTIVE EACH ADDITIONAL VESSEL  03/24/2021   IR ANGIOGRAM SELECTIVE EACH ADDITIONAL VESSEL  04/04/2021   IR ANGIOGRAM SELECTIVE EACH ADDITIONAL VESSEL  04/04/2021   IR ANGIOGRAM SELECTIVE EACH  ADDITIONAL VESSEL  04/04/2021   IR ANGIOGRAM VISCERAL SELECTIVE  12/19/2020   IR ANGIOGRAM VISCERAL SELECTIVE  03/24/2021   IR ANGIOGRAM VISCERAL SELECTIVE  04/04/2021   IR ANGIOGRAM VISCERAL SELECTIVE  04/04/2021   IR EMBO ARTERIAL NOT HEMORR HEMANG INC GUIDE ROADMAPPING  03/24/2021   IR EMBO TUMOR ORGAN ISCHEMIA INFARCT INC GUIDE ROADMAPPING  12/19/2020   IR EMBO TUMOR ORGAN ISCHEMIA INFARCT INC GUIDE ROADMAPPING  04/04/2021   IR IMAGING GUIDED PORT INSERTION  07/20/2019   IR RADIOLOGIST EVAL & MGMT  12/05/2020   IR RADIOLOGIST EVAL & MGMT  02/11/2021   IR RADIOLOGIST EVAL & MGMT  03/04/2021   IR RADIOLOGIST EVAL & MGMT  04/29/2021   IR RADIOLOGIST EVAL & MGMT  07/22/2021   IR RADIOLOGIST EVAL & MGMT  11/19/2021   IR US GUIDE VASC ACCESS RIGHT  12/19/2020   IR US GUIDE VASC ACCESS RIGHT  03/24/2021   IR US GUIDE VASC ACCESS RIGHT  04/04/2021   JOINT REPLACEMENT Left 2010   Knee   RADIOLOGY WITH ANESTHESIA N/A 01/15/2021   Procedure: CT WITH ANESTHESIA MICROWAVE ABLATION OF LIVER;  Surgeon: Criselda Peaches, MD;  Location: [facility] ORS;  Service: Anesthesiology;  Laterality: N/A;     Family History  Problem Relation Age of Onset   Peptic Ulcer Disease Father   No bleeding or clotting  disorders No aneurysms   Social History   Tobacco Use   Smoking status: Former    Packs/day: 0.25    Years: 15.00    Total pack years: 3.75    Types: Cigarettes   Smokeless tobacco: Never   Tobacco comments:    1 to 2 cigarettes a day occasionally  Vaping Use   Vaping Use: Never used  Substance Use Topics   Alcohol use: Yes    Comment: rarely    Drug use: No     Allergies  Allergen Reactions   Ace Inhibitors Swelling    Current Outpatient Medications  Medication Sig Dispense Refill   aspirin EC 81 MG tablet Take 1 tablet (81 mg total) by mouth daily. Swallow whole. 30 tablet 11   atorvastatin (LIPITOR) 20 MG tablet Take 1 tablet (20 mg total) by mouth daily. 30 tablet 0   clopidogrel (PLAVIX) 75 MG  tablet Take 1 tablet (75 mg total) by mouth daily. 30 tablet 11   oxyCODONE (OXY IR/ROXICODONE) 5 MG immediate release tablet Take 1 tablet (5 mg total) by mouth every 8 (eight) hours as needed for severe pain. 60 tablet 0   tamsulosin (FLOMAX) 0.4 MG CAPS capsule Take 1 capsule (0.4 mg total) by mouth daily. (Patient not taking: Reported on 06/23/2022) 90 capsule 1   No current facility-administered medications for this visit.   Facility-Administered Medications Ordered in Other Visits  Medication Dose Route Frequency Provider Last Rate Last Admin   atropine 1 MG/ML injection            palonosetron (ALOXI) 0.25 MG/5ML injection               REVIEW OF SYSTEMS (Negative unless checked)  Constitutional: '[x]'$ Weight loss  '[]'$ Fever  '[]'$ Chills Cardiac: '[]'$ Chest pain   '[]'$ Chest pressure   '[]'$ Palpitations   '[]'$ Shortness of breath when laying flat   '[]'$ Shortness of breath at rest   '[x]'$ Shortness of breath with exertion. Vascular:  '[]'$ Pain in legs with walking   '[]'$ Pain in legs at rest   '[]'$ Pain in legs when laying flat   '[]'$ Claudication   '[]'$ Pain in feet when walking  '[]'$ Pain in feet at rest  '[]'$ Pain in feet when laying flat   '[]'$ History of DVT   '[]'$ Phlebitis   '[]'$ Swelling in legs   '[]'$ Varicose veins   '[]'$ Non-healing ulcers Pulmonary:   '[x]'$ Uses home oxygen   '[]'$ Productive cough   '[]'$ Hemoptysis   '[]'$ Wheeze  '[x]'$ COPD   '[]'$ Asthma Neurologic:  '[]'$ Dizziness  '[]'$ Blackouts   '[]'$ Seizures   '[x]'$ History of stroke   '[]'$ History of TIA  '[]'$ Aphasia   '[]'$ Temporary blindness   '[]'$ Dysphagia   '[]'$ Weakness or numbness in arms   '[]'$ Weakness or numbness in legs Musculoskeletal:  '[]'$ Arthritis   '[]'$ Joint swelling   '[x]'$ Joint pain   '[]'$ Low back pain Hematologic:  '[]'$ Easy bruising  '[]'$ Easy bleeding   '[]'$ Hypercoagulable state   '[x]'$ Anemic  '[]'$ Hepatitis Gastrointestinal:  '[]'$ Blood in stool   '[]'$ Vomiting blood  '[]'$ Gastroesophageal reflux/heartburn   '[]'$ Abdominal pain Genitourinary:  '[]'$ Chronic kidney disease   '[]'$ Difficult urination  '[]'$ Frequent urination  '[]'$ Burning with  urination   '[]'$ Hematuria Skin:  '[]'$ Rashes   '[]'$ Ulcers   '[]'$ Wounds Psychological:  '[]'$ History of anxiety   '[]'$  History of major depression.    Physical Exam BP 105/64 (BP Location: Right Arm)   Pulse 86   Resp 16   Wt 153 lb 3.2 oz (69.5 kg)   BMI 20.78 kg/m  Gen:  Thin, NAD.  Frail appearing Head: Rich/AT, No temporalis wasting.  Ear/Nose/Throat:  Hearing grossly intact, nares w/o erythema or drainage, oropharynx w/o Erythema/Exudate Eyes: Conjunctiva clear, sclera non-icteric  Neck: trachea midline.  No JVD.  Pulmonary:  Good air movement, respirations not labored, no use of accessory muscles  Cardiac: RRR, no JVD Vascular:  Vessel Right Left  Radial Palpable Palpable                                   Gastrointestinal:. No masses, surgical incisions, or scars. Musculoskeletal: M/S 5/5 throughout.  Extremities without ischemic changes.  No deformity or atrophy.  No significant lower extremity edema. Neurologic: Sensation grossly intact in extremities.  Symmetrical.  Speech is fluent. Motor exam as listed above. Psychiatric: Judgment intact, Mood & affect appropriate for pt's clinical situation. Dermatologic: No rashes or ulcers noted.  No cellulitis or open wounds.    Radiology ECHOCARDIOGRAM COMPLETE BUBBLE STUDY  Result Date: 06/14/2022    ECHOCARDIOGRAM REPORT   Patient Name:   Oscar [name] Date of Exam: 06/14/2022 Medical Rec #:  [id number]        Height:       72.0 in Accession #:    [id number]       Weight:       152.4 lb Date of Birth:  1940/11/26       BSA:          1.898 m Patient Age:    46 years         BP:           112/64 mmHg Patient Gender: M                HR:           53 bpm. Exam Location:  Inpatient Procedure: 2D Echo, Cardiac Doppler and Color Doppler Indications:     Stroke I63.9  History:         Patient has no prior history of Echocardiogram examinations.  Sonographer:     Darlina Sicilian RDCS Referring Phys:  [id number] JAN A MANSY Diagnosing Phys: Yolonda Kida MD IMPRESSIONS  1. Left ventricular ejection fraction, by estimation, is 65 to 70%. The left ventricle has normal function. The left ventricle has no regional wall motion abnormalities. Left ventricular diastolic parameters are consistent with Grade I diastolic dysfunction (impaired relaxation).  2. Right ventricular systolic function is normal. The right ventricular size is normal.  3. The mitral valve is normal in structure. No evidence of mitral valve regurgitation.  4. The aortic valve is normal in structure. Aortic valve regurgitation is not visualized. FINDINGS  Left Ventricle: Left ventricular ejection fraction, by estimation, is 65 to 70%. The left ventricle has normal function. The left ventricle has no regional wall motion abnormalities. The left ventricular internal cavity size was normal in size. There is  no left ventricular hypertrophy. Left ventricular diastolic parameters are consistent with Grade I diastolic dysfunction (impaired relaxation). Right Ventricle: The right ventricular size is normal. No increase in right ventricular wall thickness. Right ventricular systolic function is normal. Left Atrium: Left atrial size was normal in size. Right Atrium: Right atrial size was normal in size. Pericardium: There is no evidence of pericardial effusion. Mitral Valve: The mitral valve is normal in structure. No evidence of mitral valve regurgitation. Tricuspid Valve: The tricuspid valve is normal in structure. Tricuspid valve regurgitation is trivial. Aortic Valve: The aortic valve is normal in structure. Aortic valve regurgitation is not  visualized. Pulmonic Valve: The pulmonic valve was normal in structure. Pulmonic valve regurgitation is not visualized. Aorta: The ascending aorta was not well visualized. IAS/Shunts: No atrial level shunt detected by color flow Doppler.  LEFT VENTRICLE PLAX 2D LVIDd:         4.39 cm   Diastology LVIDs:         2.76 cm   LV e' medial:    3.92 cm/s LV PW:          0.89 cm   LV E/e' medial:  11.7 LV IVS:        1.06 cm   LV e' lateral:   5.77 cm/s LVOT diam:     2.20 cm   LV E/e' lateral: 7.9 LV SV:         60 LV SV Index:   32 LVOT Area:     3.80 cm  RIGHT VENTRICLE RV S prime:     11.20 cm/s TAPSE (M-mode): 2.4 cm LEFT ATRIUM           Index        RIGHT ATRIUM           Index LA diam:      2.60 cm 1.37 cm/m   RA Area:     12.00 cm LA Vol (A2C): 44.3 ml 23.34 ml/m  RA Volume:   29.80 ml  15.70 ml/m LA Vol (A4C): 22.4 ml 11.80 ml/m  AORTIC VALVE LVOT Vmax:   79.00 cm/s LVOT Vmean:  53.000 cm/s LVOT VTI:    0.158 m  AORTA Ao Root diam: 3.50 cm MITRAL VALVE MV Area (PHT): 1.48 cm    SHUNTS MV Decel Time: 512 msec    Systemic VTI:  0.16 m MV E velocity: 45.80 cm/s  Systemic Diam: 2.20 cm MV A velocity: 73.70 cm/s MV E/A ratio:  0.62 Dwayne D Callwood MD Electronically signed by Yolonda Kida MD Signature Date/Time: 06/14/2022/4:47:10 PM    Final    CT ANGIO NECK W OR WO CONTRAST  Addendum Date: 06/14/2022   ADDENDUM REPORT: 06/14/2022 11:10 ADDENDUM: Study discussed by telephone with Dr. Frederich Chick LAMA on 06/14/2022 at 1101 hours. Electronically Signed   By: Genevie Ann M.D.   On: 06/14/2022 11:10   Result Date: 06/14/2022 CLINICAL DATA:  81 year old male with small vessel infarcts in the left basal ganglia and left occipital lobe white matter on brain MRI 2 days ago. Metastatic colon cancer. EXAM: CT ANGIOGRAPHY HEAD AND NECK TECHNIQUE: Multidetector CT imaging of the head and neck was performed using the standard protocol during bolus administration of intravenous contrast. Multiplanar CT image reconstructions and MIPs were obtained to evaluate the vascular anatomy. Carotid stenosis measurements (when applicable) are obtained utilizing NASCET criteria, using the distal internal carotid diameter as the denominator. RADIATION DOSE REDUCTION: This exam was performed according to the departmental dose-optimization program which includes automated exposure control,  adjustment of the mA and/or kV according to patient size and/or use of iterative reconstruction technique. CONTRAST:  159m OMNIPAQUE IOHEXOL 350 MG/ML SOLN COMPARISON:  Head CT and brain MRI 03/12/2022. Restaging CT Chest, Abdomen, and Pelvis 04/16/2022 FINDINGS: CT HEAD Brain: Calcified atherosclerosis at the skull base. No acute intracranial hemorrhage identified. No midline shift, mass effect, or evidence of intracranial mass lesion. Lacunar infarcts in the left basal ganglia and occipital lobe white matter remain occult by CT. Stable gray-white matter differentiation throughout the brain. Chronic posterosuperior left MCA territory encephalomalacia. Chronic lacunar infarct of the left caudate. Calvarium  and skull base: Negative. Paranasal sinuses: Visualized paranasal sinuses and mastoids are stable and well aerated. Orbits: Visualized orbits and scalp soft tissues are within normal limits. CTA NECK Skeleton: Absent dentition. Cervical spine degeneration with degenerative appearing ankylosis of C4-C5 facets. Bulky lower cervical endplate osteophytes with C6-C7 ankylosis. No acute osseous abnormality identified. Upper chest: Stable layering left pleural effusion. Numerous small upper lung nodules compatible with pulmonary metastatic disease appears stable since the June restaging exam. No superior mediastinal lymphadenopathy. Right chest Port-A-Cath remains in place. Other neck: No superimposed neck mass or lymphadenopathy identified. Aortic arch: Calcified aortic atherosclerosis. Possible four vessel arch configuration, the left vertebral artery arises at or near the arch on series 5, image 180. Calcified aortic atherosclerosis. Right carotid system: Brachiocephalic artery plaque without stenosis. Bulky soft plaque in the posterior right ICA origin, partially calcified at the bulb. Subsequent origin stenosis up to 60 % with respect to the distal vessel (series 9, image 70). Right ICA remains patent. Left carotid  system: Negative left CCA origins. Soft plaque in the medial left CCA before the bifurcation on series 5, image 124. No significant stenosis. Bulky soft plaque or thrombus and mild calcified plaque in the left ICA origin and bulb with subtotal occlusion, radiographic string sign stenosis (series 9, image 145). Subsequent decreased caliber of the downstream left ICA which remains patent to the skull base. Vertebral arteries: Proximal right subclavian artery plaque and tortuosity without stenosis. Normal right vertebral artery origin. Dominant right vertebral artery is patent to the skull base with no plaque or stenosis. Calcified atherosclerosis at the left vertebral artery origin which appears to be from the arch on series 8, image 182. Severe origin stenosis but the left vertebral artery remains patent. Non dominant left vertebral is patent to the skull base without additional plaque or stenosis. CTA HEAD Posterior circulation: No distal vertebral plaque or stenosis. Patent PICA origins and vertebrobasilar junction. Patent basilar artery without stenosis. SCA and PCA origins are normal. Posterior communicating arteries are diminutive or absent. Mild bilateral PCA P1 and P2 segment irregularity and stenosis greater on the left (series 10, image 67). Patent PCA branches. Anterior circulation: Both ICA siphons are patent. Moderate bilateral siphon calcified plaque. But no significant siphon stenosis. Normal ophthalmic artery origins. Patent carotid termini, MCA and ACA origins. Normal anterior communicating artery. Bilateral ACA branches are within normal limits. Left MCA M1 segment and bifurcation are patent without stenosis. Left MCA branches are within normal limits. Right MCA M1 segment and bifurcation are patent without stenosis. Right MCA branches are within normal limits. Venous sinuses: Patent. Anatomic variants: Left vertebral artery arises directly from the arch, 4 vessel arch configuration. Mildly dominant  right vertebral. Review of the MIP images confirms the above findings IMPRESSION: 1. Negative for large vessel occlusion but Positive for SUBTOTAL OCCLUSION of the Left ICA origin and bulb due to bulky soft plaque or thrombus. RADIOGRAPHIC STRING SIGN STENOSIS. 2. Up to 60% stenosis at the Right ICA origin due to bulky soft plaque. 3. 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 4. But no other significant arterial stenosis in the head or neck. Mild for age intracranial atherosclerosis. 5. Small vessel infarcts seen on MRI 2 days ago remain occult by CT. No new intracranial abnormality. 6. Stable layering left pleural effusion and pulmonary metastatic disease since June. 7. Aortic Atherosclerosis (ICD10-I70.0). Electronically Signed: By: Genevie Ann M.D. On: 06/14/2022 10:55   CT ANGIO HEAD W  OR WO CONTRAST  Addendum Date: 06/14/2022   ADDENDUM REPORT: 06/14/2022 11:10 ADDENDUM: Study discussed by telephone with Dr. Frederich Chick LAMA on 06/14/2022 at 1101 hours. Electronically Signed   By: Genevie Ann M.D.   On: 06/14/2022 11:10   Result Date: 06/14/2022 CLINICAL DATA:  81 year old male with small vessel infarcts in the left basal ganglia and left occipital lobe white matter on brain MRI 2 days ago. Metastatic colon cancer. EXAM: CT ANGIOGRAPHY HEAD AND NECK TECHNIQUE: Multidetector CT imaging of the head and neck was performed using the standard protocol during bolus administration of intravenous contrast. Multiplanar CT image reconstructions and MIPs were obtained to evaluate the vascular anatomy. Carotid stenosis measurements (when applicable) are obtained utilizing NASCET criteria, using the distal internal carotid diameter as the denominator. RADIATION DOSE REDUCTION: This exam was performed according to the departmental dose-optimization program which includes automated exposure control, adjustment of the mA and/or kV according to patient size and/or use of iterative  reconstruction technique. CONTRAST:  169m OMNIPAQUE IOHEXOL 350 MG/ML SOLN COMPARISON:  Head CT and brain MRI 03/12/2022. Restaging CT Chest, Abdomen, and Pelvis 04/16/2022 FINDINGS: CT HEAD Brain: Calcified atherosclerosis at the skull base. No acute intracranial hemorrhage identified. No midline shift, mass effect, or evidence of intracranial mass lesion. Lacunar infarcts in the left basal ganglia and occipital lobe white matter remain occult by CT. Stable gray-white matter differentiation throughout the brain. Chronic posterosuperior left MCA territory encephalomalacia. Chronic lacunar infarct of the left caudate. Calvarium and skull base: Negative. Paranasal sinuses: Visualized paranasal sinuses and mastoids are stable and well aerated. Orbits: Visualized orbits and scalp soft tissues are within normal limits. CTA NECK Skeleton: Absent dentition. Cervical spine degeneration with degenerative appearing ankylosis of C4-C5 facets. Bulky lower cervical endplate osteophytes with C6-C7 ankylosis. No acute osseous abnormality identified. Upper chest: Stable layering left pleural effusion. Numerous small upper lung nodules compatible with pulmonary metastatic disease appears stable since the June restaging exam. No superior mediastinal lymphadenopathy. Right chest Port-A-Cath remains in place. Other neck: No superimposed neck mass or lymphadenopathy identified. Aortic arch: Calcified aortic atherosclerosis. Possible four vessel arch configuration, the left vertebral artery arises at or near the arch on series 5, image 180. Calcified aortic atherosclerosis. Right carotid system: Brachiocephalic artery plaque without stenosis. Bulky soft plaque in the posterior right ICA origin, partially calcified at the bulb. Subsequent origin stenosis up to 60 % with respect to the distal vessel (series 9, image 70). Right ICA remains patent. Left carotid system: Negative left CCA origins. Soft plaque in the medial left CCA before the  bifurcation on series 5, image 124. No significant stenosis. Bulky soft plaque or thrombus and mild calcified plaque in the left ICA origin and bulb with subtotal occlusion, radiographic string sign stenosis (series 9, image 145). Subsequent decreased caliber of the downstream left ICA which remains patent to the skull base. Vertebral arteries: Proximal right subclavian artery plaque and tortuosity without stenosis. Normal right vertebral artery origin. Dominant right vertebral artery is patent to the skull base with no plaque or stenosis. Calcified atherosclerosis at the left vertebral artery origin which appears to be from the arch on series 8, image 182. Severe origin stenosis but the left vertebral artery remains patent. Non dominant left vertebral is patent to the skull base without additional plaque or stenosis. CTA HEAD Posterior circulation: No distal vertebral plaque or stenosis. Patent PICA origins and vertebrobasilar junction. Patent basilar artery without stenosis. SCA and PCA origins are normal. Posterior communicating arteries are diminutive  or absent. Mild bilateral PCA P1 and P2 segment irregularity and stenosis greater on the left (series 10, image 67). Patent PCA branches. Anterior circulation: Both ICA siphons are patent. Moderate bilateral siphon calcified plaque. But no significant siphon stenosis. Normal ophthalmic artery origins. Patent carotid termini, MCA and ACA origins. Normal anterior communicating artery. Bilateral ACA branches are within normal limits. Left MCA M1 segment and bifurcation are patent without stenosis. Left MCA branches are within normal limits. Right MCA M1 segment and bifurcation are patent without stenosis. Right MCA branches are within normal limits. Venous sinuses: Patent. Anatomic variants: Left vertebral artery arises directly from the arch, 4 vessel arch configuration. Mildly dominant right vertebral. Review of the MIP images confirms the above findings IMPRESSION:  1. Negative for large vessel occlusion but Positive for SUBTOTAL OCCLUSION of the Left ICA origin and bulb due to bulky soft plaque or thrombus. RADIOGRAPHIC STRING SIGN STENOSIS. 2. Up to 60% stenosis at the Right ICA origin due to bulky soft plaque. 3. 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 4. But no other significant arterial stenosis in the head or neck. Mild for age intracranial atherosclerosis. 5. Small vessel infarcts seen on MRI 2 days ago remain occult by CT. No new intracranial abnormality. 6. Stable layering left pleural effusion and pulmonary metastatic disease since June. 7. Aortic Atherosclerosis (ICD10-I70.0). Electronically Signed: By: Genevie Ann M.D. On: 06/14/2022 10:55   US Carotid Bilateral (at [facility] and [facility] only)  Result Date: 06/13/2022 CLINICAL DATA:  Stroke Syncope Hyperlipidemia Diabetes Tobacco abuse EXAM: BILATERAL CAROTID DUPLEX ULTRASOUND TECHNIQUE: Pearline Cables scale imaging, color Doppler and duplex ultrasound were performed of bilateral carotid and vertebral arteries in the neck. COMPARISON:  None available FINDINGS: Criteria: Quantification of carotid stenosis is based on velocity parameters that correlate the residual internal carotid diameter with NASCET-based stenosis levels, using the diameter of the distal internal carotid lumen as the denominator for stenosis measurement. The following velocity measurements were obtained: RIGHT ICA: 145/44 cm/sec CCA: 71/24 cm/sec SYSTOLIC ICA/CCA RATIO:  1.8 ECA: 107 cm/sec LEFT ICA: 352/100 cm/sec CCA: 58/0 cm/sec SYSTOLIC ICA/CCA RATIO:  7.3 ECA: 91 cm/sec RIGHT CAROTID ARTERY: Moderate focal heterogeneous plaque noted in the carotid bulb. RIGHT VERTEBRAL ARTERY:  Antegrade flow. LEFT CAROTID ARTERY: Extensive heterogeneous plaque of the left internal carotid artery origin. LEFT VERTEBRAL ARTERY:  Antegrade flow. IMPRESSION: 1. 50-69% stenosis of the right internal carotid artery. 2. Greater  than 70% stenosis of the left carotid artery. 3. Further evaluation with CT angiography of the neck should be performed to better quantify plaque burden and degree of stenosis. Electronically Signed   By: Miachel Roux M.D.   On: 06/13/2022 15:02   MR BRAIN WO CONTRAST  Result Date: 06/12/2022 CLINICAL DATA:  Right-sided weakness EXAM: MRI HEAD WITHOUT CONTRAST TECHNIQUE: Multiplanar, multiecho pulse sequences of the brain and surrounding structures were obtained without intravenous contrast. COMPARISON:  None Available. FINDINGS: Brain: There are small foci of acute/subacute ischemia the left internal capsule posterior limb and in the left occipital lobe. No acute or chronic hemorrhage. There is multifocal hyperintense T2-weighted signal within the white matter. Generalized volume loss. Old left basal ganglia small vessel infarct. The midline structures are normal. Vascular: Major flow voids are preserved. Skull and upper cervical spine: Normal calvarium and skull base. Visualized upper cervical spine and soft tissues are normal. Sinuses/Orbits:No paranasal sinus fluid levels or advanced mucosal thickening. No mastoid or middle ear effusion. Normal orbits.  IMPRESSION: Small foci of acute/subacute ischemia in the left internal capsule posterior limb and left occipital lobe. No hemorrhage or mass effect. Electronically Signed   By: Ulyses Jarred M.D.   On: 06/12/2022 21:01   CT HEAD WO CONTRAST  Result Date: 06/12/2022 CLINICAL DATA:  Neurological deficit EXAM: CT HEAD WITHOUT CONTRAST TECHNIQUE: Contiguous axial images were obtained from the base of the skull through the vertex without intravenous contrast. RADIATION DOSE REDUCTION: This exam was performed according to the departmental dose-optimization program which includes automated exposure control, adjustment of the mA and/or kV according to patient size and/or use of iterative reconstruction technique. COMPARISON:  Head CT dated November 09, 2020  FINDINGS: Brain: Old lacunar infarcts of the bilateral basal ganglia. No evidence of acute infarction, hemorrhage, hydrocephalus, extra-axial collection or mass lesion/mass effect. Vascular: No hyperdense vessel or unexpected calcification. Skull: Normal. Negative for fracture or focal lesion. Sinuses/Orbits: No acute finding. Other: None. IMPRESSION: No acute intracranial abnormality. Electronically Signed   By: Yetta Glassman M.D.   On: 06/12/2022 17:46    Labs Recent Results (from the past 2160 hour(s))  Protein / creatinine ratio, urine     Status: None   Collection Time: 04/06/22  8:29 AM  Result Value Ref Range   Creatinine, Urine 345 mg/dL   Total Protein, Urine 26 mg/dL    Comment: NO NORMAL RANGE ESTABLISHED FOR THIS TEST   Protein Creatinine Ratio 0.08 0.00 - 0.15 mg/mg[Cre]    Comment: Performed at [facility], [address] [place], [place] [address]  CEA     Status: Abnormal   Collection Time: 04/06/22  8:29 AM  Result Value Ref Range   CEA 138.0 (H) 0.0 - 4.7 ng/mL    Comment: (NOTE)                             Nonsmokers          <3.9                             Smokers             <5.6 [employer] Electrochemiluminescence Immunoassay (ECLIA) Values obtained with different assay methods or kits cannot be used interchangeably.  Results cannot be interpreted as absolute evidence of the presence or absence of malignant disease. Performed At: [facility] [place], [place] [id number] Rush Farmer MD [id number]   Comprehensive metabolic panel     Status: Abnormal   Collection Time: 04/06/22  8:29 AM  Result Value Ref Range   Sodium 141 135 - 145 mmol/L   Potassium 3.7 3.5 - 5.1 mmol/L   Chloride 109 98 - 111 mmol/L   CO2 25 22 - 32 mmol/L   Glucose, Bld 136 (H) 70 - 99 mg/dL    Comment: Glucose reference range applies only to samples taken after fasting for at least 8 hours.   BUN 13 8 - 23 mg/dL   Creatinine, Ser 0.86  0.61 - 1.24 mg/dL   Calcium 8.2 (L) 8.9 - 10.3 mg/dL   Total Protein 6.9 6.5 - 8.1 g/dL   Albumin 2.7 (L) 3.5 - 5.0 g/dL   AST 33 15 - 41 U/L   ALT 21 0 - 44 U/L   Alkaline Phosphatase 160 (H) 38 - 126 U/L   Total Bilirubin 0.6 0.3 - 1.2 mg/dL   GFR, Estimated >60 >  60 mL/min    Comment: (NOTE) Calculated using the CKD-EPI Creatinine Equation (2021)    Anion gap 7 5 - 15    Comment: Performed at [facility], [address] [place], [place] [address]  CBC with Differential     Status: Abnormal   Collection Time: 04/06/22  8:29 AM  Result Value Ref Range   WBC 4.2 4.0 - 10.5 K/uL   RBC 3.31 (L) 4.22 - 5.81 MIL/uL   Hemoglobin 10.9 (L) 13.0 - 17.0 g/dL   HCT 33.0 (L) 39.0 - 52.0 %   MCV 99.7 80.0 - 100.0 fL   MCH 32.9 26.0 - 34.0 pg   MCHC 33.0 30.0 - 36.0 g/dL   RDW 17.2 (H) 11.5 - 15.5 %   Platelets 176 150 - 400 K/uL   nRBC 0.0 0.0 - 0.2 %   Neutrophils Relative % 62 %   Neutro Abs 2.6 1.7 - 7.7 K/uL   Lymphocytes Relative 19 %   Lymphs Abs 0.8 0.7 - 4.0 K/uL   Monocytes Relative 10 %   Monocytes Absolute 0.4 0.1 - 1.0 K/uL   Eosinophils Relative 8 %   Eosinophils Absolute 0.3 0.0 - 0.5 K/uL   Basophils Relative 1 %   Basophils Absolute 0.0 0.0 - 0.1 K/uL   Immature Granulocytes 0 %   Abs Immature Granulocytes 0.01 0.00 - 0.07 K/uL    Comment: Performed at [facility], [address] [place], [place] [address]  CEA     Status: Abnormal   Collection Time: 04/20/22  9:40 AM  Result Value Ref Range   CEA 86.4 (H) 0.0 - 4.7 ng/mL    Comment: (NOTE)                             Nonsmokers          <3.9                             Smokers             <5.6 [employer] Electrochemiluminescence Immunoassay (ECLIA) Values obtained with different assay methods or kits cannot be used interchangeably.  Results cannot be interpreted as absolute evidence of the presence or absence of malignant disease. Performed At: [facility] [place], [place] [id number] Rush Farmer MD KG:[id number]   Comprehensive metabolic panel     Status: Abnormal   Collection Time: 04/20/22  9:40 AM  Result Value Ref Range   Sodium 138 135 - 145 mmol/L   Potassium 3.9 3.5 - 5.1 mmol/L   Chloride 110 98 - 111 mmol/L   CO2 25 22 - 32 mmol/L   Glucose, Bld 138 (H) 70 - 99 mg/dL    Comment: Glucose reference range applies only to samples taken after fasting for at least 8 hours.   BUN 14 8 - 23 mg/dL   Creatinine, Ser 0.84 0.61 - 1.24 mg/dL   Calcium 8.0 (L) 8.9 - 10.3 mg/dL   Total Protein 6.3 (L) 6.5 - 8.1 g/dL   Albumin 2.5 (L) 3.5 - 5.0 g/dL   AST 29 15 - 41 U/L   ALT 19 0 - 44 U/L   Alkaline Phosphatase 162 (H) 38 - 126 U/L   Total Bilirubin 0.4 0.3 - 1.2 mg/dL   GFR, Estimated >60 >60 mL/min    Comment: (NOTE) Calculated using the CKD-EPI  Creatinine Equation (2021)    Anion gap 3 (L) 5 - 15    Comment: Performed at [facility], [address] [place], [place] [address]  CBC with Differential     Status: Abnormal   Collection Time: 04/20/22  9:40 AM  Result Value Ref Range   WBC 3.6 (L) 4.0 - 10.5 K/uL   RBC 2.94 (L) 4.22 - 5.81 MIL/uL   Hemoglobin 9.8 (L) 13.0 - 17.0 g/dL   HCT 29.9 (L) 39.0 - 52.0 %   MCV 101.7 (H) 80.0 - 100.0 fL   MCH 33.3 26.0 - 34.0 pg   MCHC 32.8 30.0 - 36.0 g/dL   RDW 17.1 (H) 11.5 - 15.5 %   Platelets 144 (L) 150 - 400 K/uL   nRBC 0.0 0.0 - 0.2 %   Neutrophils Relative % 64 %   Neutro Abs 2.3 1.7 - 7.7 K/uL   Lymphocytes Relative 16 %   Lymphs Abs 0.6 (L) 0.7 - 4.0 K/uL   Monocytes Relative 11 %   Monocytes Absolute 0.4 0.1 - 1.0 K/uL   Eosinophils Relative 8 %   Eosinophils Absolute 0.3 0.0 - 0.5 K/uL   Basophils Relative 1 %   Basophils Absolute 0.0 0.0 - 0.1 K/uL   Immature Granulocytes 0 %   Abs Immature Granulocytes 0.01 0.00 - 0.07 K/uL    Comment: Performed at [facility], [address] [place], [place] [address]  Protein / creatinine ratio, urine     Status:  None   Collection Time: 04/20/22  9:43 AM  Result Value Ref Range   Creatinine, Urine 320 mg/dL   Total Protein, Urine 20 mg/dL    Comment: NO NORMAL RANGE ESTABLISHED FOR THIS TEST   Protein Creatinine Ratio 0.06 0.00 - 0.15 mg/mg[Cre]    Comment: Performed at [facility], [address] [place], [place] [address]  CEA     Status: Abnormal   Collection Time: 05/04/22  8:26 AM  Result Value Ref Range   CEA 79.8 (H) 0.0 - 4.7 ng/mL    Comment: (NOTE)                             Nonsmokers          <3.9                             Smokers             <5.6 [employer] Electrochemiluminescence Immunoassay (ECLIA) Values obtained with different assay methods or kits cannot be used interchangeably.  Results cannot be interpreted as absolute evidence of the presence or absence of malignant disease. Performed At: [facility] [place], [place] [id number] Rush Farmer MD [id number]   Comprehensive metabolic panel     Status: Abnormal   Collection Time: 05/04/22  8:26 AM  Result Value Ref Range   Sodium 139 135 - 145 mmol/L   Potassium 3.9 3.5 - 5.1 mmol/L   Chloride 110 98 - 111 mmol/L   CO2 26 22 - 32 mmol/L   Glucose, Bld 139 (H) 70 - 99 mg/dL    Comment: Glucose reference range applies only to samples taken after fasting for at least 8 hours.   BUN 12 8 - 23 mg/dL   Creatinine, Ser 0.85 0.61 - 1.24 mg/dL   Calcium 8.1 (L) 8.9 - 10.3 mg/dL  Total Protein 6.7 6.5 - 8.1 g/dL   Albumin 2.7 (L) 3.5 - 5.0 g/dL   AST 32 15 - 41 U/L   ALT 18 0 - 44 U/L   Alkaline Phosphatase 152 (H) 38 - 126 U/L   Total Bilirubin 1.1 0.3 - 1.2 mg/dL   GFR, Estimated >60 >60 mL/min    Comment: (NOTE) Calculated using the CKD-EPI Creatinine Equation (2021)    Anion gap 3 (L) 5 - 15    Comment: Performed at [facility], [address] [place], [place] [address]  CBC with Differential     Status: Abnormal   Collection Time: 05/04/22  8:26 AM   Result Value Ref Range   WBC 4.7 4.0 - 10.5 K/uL   RBC 3.39 (L) 4.22 - 5.81 MIL/uL   Hemoglobin 11.1 (L) 13.0 - 17.0 g/dL   HCT 34.3 (L) 39.0 - 52.0 %   MCV 101.2 (H) 80.0 - 100.0 fL   MCH 32.7 26.0 - 34.0 pg   MCHC 32.4 30.0 - 36.0 g/dL   RDW 16.5 (H) 11.5 - 15.5 %   Platelets 179 150 - 400 K/uL   nRBC 0.0 0.0 - 0.2 %   Neutrophils Relative % 61 %   Neutro Abs 2.9 1.7 - 7.7 K/uL   Lymphocytes Relative 20 %   Lymphs Abs 0.9 0.7 - 4.0 K/uL   Monocytes Relative 11 %   Monocytes Absolute 0.5 0.1 - 1.0 K/uL   Eosinophils Relative 7 %   Eosinophils Absolute 0.3 0.0 - 0.5 K/uL   Basophils Relative 1 %   Basophils Absolute 0.1 0.0 - 0.1 K/uL   Immature Granulocytes 0 %   Abs Immature Granulocytes 0.01 0.00 - 0.07 K/uL    Comment: Performed at [facility], [address] [place], [place] [address]  Protein / creatinine ratio, urine     Status: None   Collection Time: 05/20/22  8:19 AM  Result Value Ref Range   Creatinine, Urine 79 mg/dL   Total Protein, Urine 11 mg/dL    Comment: NO NORMAL RANGE ESTABLISHED FOR THIS TEST   Protein Creatinine Ratio 0.14 0.00 - 0.15 mg/mg[Cre]    Comment: Performed at [facility], [address] [place], [place] [address]  CBC with Differential     Status: Abnormal   Collection Time: 05/20/22  8:19 AM  Result Value Ref Range   WBC 4.3 4.0 - 10.5 K/uL   RBC 3.38 (L) 4.22 - 5.81 MIL/uL   Hemoglobin 11.1 (L) 13.0 - 17.0 g/dL   HCT 34.2 (L) 39.0 - 52.0 %   MCV 101.2 (H) 80.0 - 100.0 fL   MCH 32.8 26.0 - 34.0 pg   MCHC 32.5 30.0 - 36.0 g/dL   RDW 16.0 (H) 11.5 - 15.5 %   Platelets 206 150 - 400 K/uL   nRBC 0.0 0.0 - 0.2 %   Neutrophils Relative % 55 %   Neutro Abs 2.4 1.7 - 7.7 K/uL   Lymphocytes Relative 24 %   Lymphs Abs 1.0 0.7 - 4.0 K/uL   Monocytes Relative 13 %   Monocytes Absolute 0.6 0.1 - 1.0 K/uL   Eosinophils Relative 7 %   Eosinophils Absolute 0.3 0.0 - 0.5 K/uL   Basophils Relative 1 %   Basophils Absolute  0.0 0.0 - 0.1 K/uL   Immature Granulocytes 0 %   Abs Immature Granulocytes 0.01 0.00 - 0.07 K/uL    Comment: Performed at [facility], [address] [place], [place] [address]  Comprehensive metabolic panel     Status: Abnormal   Collection Time: 05/20/22  8:19 AM  Result Value Ref Range   Sodium 140 135 - 145 mmol/L   Potassium 3.8 3.5 - 5.1 mmol/L   Chloride 110 98 - 111 mmol/L   CO2 26 22 - 32 mmol/L   Glucose, Bld 158 (H) 70 - 99 mg/dL    Comment: Glucose reference range applies only to samples taken after fasting for at least 8 hours.   BUN 13 8 - 23 mg/dL   Creatinine, Ser 0.75 0.61 - 1.24 mg/dL   Calcium 8.5 (L) 8.9 - 10.3 mg/dL   Total Protein 7.0 6.5 - 8.1 g/dL   Albumin 2.9 (L) 3.5 - 5.0 g/dL   AST 35 15 - 41 U/L   ALT 18 0 - 44 U/L   Alkaline Phosphatase 171 (H) 38 - 126 U/L   Total Bilirubin 0.8 0.3 - 1.2 mg/dL   GFR, Estimated >60 >60 mL/min    Comment: (NOTE) Calculated using the CKD-EPI Creatinine Equation (2021)    Anion gap 4 (L) 5 - 15    Comment: Performed at [facility], [address] [place], [place] [address]  CEA     Status: Abnormal   Collection Time: 05/20/22  8:19 AM  Result Value Ref Range   CEA 69.3 (H) 0.0 - 4.7 ng/mL    Comment: (NOTE)                             Nonsmokers          <3.9                             Smokers             <5.6 [employer] Electrochemiluminescence Immunoassay (ECLIA) Values obtained with different assay methods or kits cannot be used interchangeably.  Results cannot be interpreted as absolute evidence of the presence or absence of malignant disease. Performed At: [facility] [place], [place] [id number] Rush Farmer MD [id number]   CEA     Status: Abnormal   Collection Time: 06/03/22  8:22 AM  Result Value Ref Range   CEA 61.5 (H) 0.0 - 4.7 ng/mL    Comment: (NOTE)                             Nonsmokers          <3.9                             Smokers              <5.6 [employer] Electrochemiluminescence Immunoassay (ECLIA) Values obtained with different assay methods or kits cannot be used interchangeably.  Results cannot be interpreted as absolute evidence of the presence or absence of malignant disease. Performed At: [facility] [place], [place] [id number] Rush Farmer MD [id number]   Comprehensive metabolic panel     Status: Abnormal   Collection Time: 06/03/22  8:22 AM  Result Value Ref Range   Sodium 139 135 - 145 mmol/L   Potassium 4.2 3.5 - 5.1 mmol/L   Chloride 109 98 - 111 mmol/L   CO2 25 22 - 32 mmol/L  Glucose, Bld 177 (H) 70 - 99 mg/dL    Comment: Glucose reference range applies only to samples taken after fasting for at least 8 hours.   BUN 14 8 - 23 mg/dL   Creatinine, Ser 0.89 0.61 - 1.24 mg/dL   Calcium 8.0 (L) 8.9 - 10.3 mg/dL   Total Protein 6.8 6.5 - 8.1 g/dL   Albumin 2.7 (L) 3.5 - 5.0 g/dL   AST 35 15 - 41 U/L   ALT 19 0 - 44 U/L   Alkaline Phosphatase 156 (H) 38 - 126 U/L   Total Bilirubin 0.8 0.3 - 1.2 mg/dL   GFR, Estimated >60 >60 mL/min    Comment: (NOTE) Calculated using the CKD-EPI Creatinine Equation (2021)    Anion gap 5 5 - 15    Comment: Performed at [facility], [address] [place], [place] [address]  CBC with Differential     Status: Abnormal   Collection Time: 06/03/22  8:22 AM  Result Value Ref Range   WBC 4.4 4.0 - 10.5 K/uL   RBC 3.25 (L) 4.22 - 5.81 MIL/uL   Hemoglobin 10.6 (L) 13.0 - 17.0 g/dL   HCT 32.6 (L) 39.0 - 52.0 %   MCV 100.3 (H) 80.0 - 100.0 fL   MCH 32.6 26.0 - 34.0 pg   MCHC 32.5 30.0 - 36.0 g/dL   RDW 16.3 (H) 11.5 - 15.5 %   Platelets 172 150 - 400 K/uL   nRBC 0.0 0.0 - 0.2 %   Neutrophils Relative % 65 %   Neutro Abs 2.9 1.7 - 7.7 K/uL   Lymphocytes Relative 17 %   Lymphs Abs 0.8 0.7 - 4.0 K/uL   Monocytes Relative 11 %   Monocytes Absolute 0.5 0.1 - 1.0 K/uL   Eosinophils Relative 5 %   Eosinophils Absolute  0.2 0.0 - 0.5 K/uL   Basophils Relative 1 %   Basophils Absolute 0.0 0.0 - 0.1 K/uL   Immature Granulocytes 1 %   Abs Immature Granulocytes 0.02 0.00 - 0.07 K/uL    Comment: Performed at [facility], [address] [place], [place] [address]  Protein / creatinine ratio, urine     Status: None   Collection Time: 06/03/22  9:15 AM  Result Value Ref Range   Creatinine, Urine 350 mg/dL   Total Protein, Urine 16 mg/dL    Comment: NO NORMAL RANGE ESTABLISHED FOR THIS TEST   Protein Creatinine Ratio 0.05 0.00 - 0.15 mg/mg[Cre]    Comment: Performed at [facility], [address] [place], [place] [address]  CBG monitoring, ED     Status: None   Collection Time: 06/12/22  4:41 PM  Result Value Ref Range   Glucose-Capillary 84 70 - 99 mg/dL    Comment: Glucose reference range applies only to samples taken after fasting for at least 8 hours.  Protime-INR     Status: None   Collection Time: 06/12/22  4:56 PM  Result Value Ref Range   Prothrombin Time 15.1 11.4 - 15.2 seconds   INR 1.2 0.8 - 1.2    Comment: (NOTE) INR goal varies based on device and disease states. Performed at [facility], [address] [place], [place] [address]   APTT     Status: None   Collection Time: 06/12/22  4:56 PM  Result Value Ref Range   aPTT 31 24 - 36 seconds    Comment: Performed at [facility], [place], [place]  [address]  CBC     Status: Abnormal   Collection Time: 06/12/22  4:56 PM  Result Value Ref Range   WBC 3.1 (L) 4.0 - 10.5 K/uL   RBC 3.34 (L) 4.22 - 5.81 MIL/uL   Hemoglobin 10.6 (L) 13.0 - 17.0 g/dL   HCT 33.5 (L) 39.0 - 52.0 %   MCV 100.3 (H) 80.0 - 100.0 fL   MCH 31.7 26.0 - 34.0 pg   MCHC 31.6 30.0 - 36.0 g/dL   RDW 16.2 (H) 11.5 - 15.5 %   Platelets 180 150 - 400 K/uL   nRBC 0.0 0.0 - 0.2 %    Comment: Performed at [facility], [address] [place], [place] [address]  Differential     Status: None   Collection  Time: 06/12/22  4:56 PM  Result Value Ref Range   Neutrophils Relative % 60 %   Neutro Abs 1.8 1.7 - 7.7 K/uL   Lymphocytes Relative 21 %   Lymphs Abs 0.7 0.7 - 4.0 K/uL   Monocytes Relative 13 %   Monocytes Absolute 0.4 0.1 - 1.0 K/uL   Eosinophils Relative 5 %   Eosinophils Absolute 0.2 0.0 - 0.5 K/uL   Basophils Relative 1 %   Basophils Absolute 0.0 0.0 - 0.1 K/uL   Immature Granulocytes 0 %   Abs Immature Granulocytes 0.01 0.00 - 0.07 K/uL    Comment: Performed at [facility], [address] [place], [place] [address]  Comprehensive metabolic panel     Status: Abnormal   Collection Time: 06/12/22  4:56 PM  Result Value Ref Range   Sodium 140 135 - 145 mmol/L   Potassium 4.2 3.5 - 5.1 mmol/L   Chloride 108 98 - 111 mmol/L   CO2 28 22 - 32 mmol/L   Glucose, Bld 101 (H) 70 - 99 mg/dL    Comment: Glucose reference range applies only to samples taken after fasting for at least 8 hours.   BUN 13 8 - 23 mg/dL   Creatinine, Ser 0.75 0.61 - 1.24 mg/dL   Calcium 8.4 (L) 8.9 - 10.3 mg/dL   Total Protein 7.1 6.5 - 8.1 g/dL   Albumin 3.0 (L) 3.5 - 5.0 g/dL   AST 32 15 - 41 U/L   ALT 22 0 - 44 U/L   Alkaline Phosphatase 156 (H) 38 - 126 U/L   Total Bilirubin 0.9 0.3 - 1.2 mg/dL   GFR, Estimated >60 >60 mL/min    Comment: (NOTE) Calculated using the CKD-EPI Creatinine Equation (2021)    Anion gap 4 (L) 5 - 15    Comment: Performed at [facility], [address], [place], [place] [address]  Ethanol     Status: None   Collection Time: 06/12/22  4:56 PM  Result Value Ref Range   Alcohol, Ethyl (B) <10 <10 mg/dL    Comment: (NOTE) Lowest detectable limit for serum alcohol is 10 mg/dL.  For medical purposes only. Performed at [facility], [address] [place], [place] [address]   Hemoglobin A1c     Status: None   Collection Time: 06/12/22  4:56 PM  Result Value Ref Range   Hgb A1c MFr Bld 5.4 4.8 - 5.6 %    Comment: (NOTE) Pre diabetes:           5.7%-6.4%  Diabetes:              >6.4%  Glycemic control for   <7.0% adults with diabetes  Mean Plasma Glucose 108.28 mg/dL    Comment: Performed at [place] [address] [place], [place] [address]  Lipid panel     Status: Abnormal   Collection Time: 06/13/22  4:45 AM  Result Value Ref Range   Cholesterol 126 0 - 200 mg/dL   Triglycerides 36 <150 mg/dL   HDL 40 (L) >40 mg/dL   Total CHOL/HDL Ratio 3.2 RATIO   VLDL 7 0 - 40 mg/dL   LDL Cholesterol 79 0 - 99 mg/dL    Comment:        Total Cholesterol/HDL:CHD Risk Coronary Heart Disease Risk Table                     Men   Women  1/2 Average Risk   3.4   3.3  Average Risk       5.0   4.4  2 X Average Risk   9.6   7.1  3 X Average Risk  23.4   11.0        Use the calculated Patient Ratio above and the CHD Risk Table to determine the patient's CHD Risk.        ATP III CLASSIFICATION (LDL):  <100     mg/dL   Optimal  100-129  mg/dL   Near or Above                    Optimal  130-159  mg/dL   Borderline  160-189  mg/dL   High  >190     mg/dL   Very High Performed at [facility], [address] [place], [place] [address]   Basic metabolic panel     Status: Abnormal   Collection Time: 06/13/22  4:45 AM  Result Value Ref Range   Sodium 138 135 - 145 mmol/L   Potassium 3.9 3.5 - 5.1 mmol/L   Chloride 111 98 - 111 mmol/L   CO2 24 22 - 32 mmol/L   Glucose, Bld 93 70 - 99 mg/dL    Comment: Glucose reference range applies only to samples taken after fasting for at least 8 hours.   BUN 12 8 - 23 mg/dL   Creatinine, Ser 0.68 0.61 - 1.24 mg/dL   Calcium 7.9 (L) 8.9 - 10.3 mg/dL   GFR, Estimated >60 >60 mL/min    Comment: (NOTE) Calculated using the CKD-EPI Creatinine Equation (2021)    Anion gap 3 (L) 5 - 15    Comment: Performed at [facility], [address] [place], [place] [address]  CBC     Status: Abnormal   Collection Time: 06/13/22  4:45 AM  Result Value Ref Range   WBC  2.9 (L) 4.0 - 10.5 K/uL   RBC 2.96 (L) 4.22 - 5.81 MIL/uL   Hemoglobin 9.4 (L) 13.0 - 17.0 g/dL   HCT 28.5 (L) 39.0 - 52.0 %   MCV 96.3 80.0 - 100.0 fL   MCH 31.8 26.0 - 34.0 pg   MCHC 33.0 30.0 - 36.0 g/dL   RDW 16.4 (H) 11.5 - 15.5 %   Platelets 127 (L) 150 - 400 K/uL   nRBC 0.0 0.0 - 0.2 %    Comment: Performed at [facility], [address] [place], [place] [address]  Occult blood card to lab, stool     Status: Abnormal   Collection Time: 06/13/22  7:30 PM  Result Value Ref Range   Fecal Occult Bld POSITIVE (A) NEGATIVE  Comment: Performed at [facility], [address] [place], [place] [address]  CBC     Status: Abnormal   Collection Time: 06/14/22  5:06 AM  Result Value Ref Range   WBC 3.0 (L) 4.0 - 10.5 K/uL   RBC 2.89 (L) 4.22 - 5.81 MIL/uL   Hemoglobin 9.2 (L) 13.0 - 17.0 g/dL   HCT 28.0 (L) 39.0 - 52.0 %   MCV 96.9 80.0 - 100.0 fL   MCH 31.8 26.0 - 34.0 pg   MCHC 32.9 30.0 - 36.0 g/dL   RDW 16.0 (H) 11.5 - 15.5 %   Platelets 126 (L) 150 - 400 K/uL   nRBC 0.0 0.0 - 0.2 %    Comment: Performed at [facility], [address], [name], [place] [address]  Basic metabolic panel     Status: Abnormal   Collection Time: 06/14/22  5:06 AM  Result Value Ref Range   Sodium 140 135 - 145 mmol/L   Potassium 3.7 3.5 - 5.1 mmol/L   Chloride 112 (H) 98 - 111 mmol/L   CO2 25 22 - 32 mmol/L   Glucose, Bld 91 70 - 99 mg/dL    Comment: Glucose reference range applies only to samples taken after fasting for at least 8 hours.   BUN 12 8 - 23 mg/dL   Creatinine, Ser 0.72 0.61 - 1.24 mg/dL   Calcium 7.9 (L) 8.9 - 10.3 mg/dL   GFR, Estimated >60 >60 mL/min    Comment: (NOTE) Calculated using the CKD-EPI Creatinine Equation (2021)    Anion gap 3 (L) 5 - 15    Comment: Performed at [facility], [address] [place], [place] [address]  ECHOCARDIOGRAM COMPLETE BUBBLE STUDY     Status: None   Collection Time: 06/14/22 10:18 AM  Result  Value Ref Range   S' Lateral 2.76 cm   Area-P 1/2 1.48 cm2  Hemoglobin and hematocrit, blood     Status: Abnormal   Collection Time: 06/14/22 11:32 AM  Result Value Ref Range   Hemoglobin 11.0 (L) 13.0 - 17.0 g/dL   HCT 33.6 (L) 39.0 - 52.0 %    Comment: Performed at [facility], [address] [place], [place] [address]  Protime-INR     Status: Abnormal   Collection Time: 06/14/22 11:32 AM  Result Value Ref Range   Prothrombin Time 15.5 (H) 11.4 - 15.2 seconds   INR 1.2 0.8 - 1.2    Comment: (NOTE) INR goal varies based on device and disease states. Performed at [facility], [address] [place],  [address]   Hemoglobin and hematocrit, blood     Status: Abnormal   Collection Time: 06/14/22  5:02 PM  Result Value Ref Range   Hemoglobin 10.2 (L) 13.0 - 17.0 g/dL   HCT 31.1 (L) 39.0 - 52.0 %    Comment: Performed at [facility], [place], [place] [address]  Heparin level (unfractionated)     Status: Abnormal   Collection Time: 06/14/22  8:05 PM  Result Value Ref Range   Heparin Unfractionated 0.16 (L) 0.30 - 0.70 IU/mL    Comment: (NOTE) The clinical reportable range upper limit is being lowered to >1.10 to align with the FDA approved guidance for the current laboratory assay.  If heparin results are below expected values, and patient dosage has  been confirmed, suggest follow up testing of antithrombin III levels. Performed at [facility], [address] [place],  [place] [address]   Hemoglobin and hematocrit, blood     Status: Abnormal   Collection Time: 06/14/22 11:07 PM  Result Value Ref Range   Hemoglobin 8.7 (L) 13.0 - 17.0 g/dL   HCT 26.2 (L) 39.0 - 52.0 %    Comment: Performed at [facility], [address] [place], [place] [address]  CBC     Status: Abnormal   Collection Time: 06/15/22  5:11 AM  Result Value Ref Range   WBC 3.5 (L) 4.0 - 10.5 K/uL   RBC 2.84 (L) 4.22 - 5.81 MIL/uL    Hemoglobin 9.0 (L) 13.0 - 17.0 g/dL   HCT 27.4 (L) 39.0 - 52.0 %   MCV 96.5 80.0 - 100.0 fL   MCH 31.7 26.0 - 34.0 pg   MCHC 32.8 30.0 - 36.0 g/dL   RDW 16.3 (H) 11.5 - 15.5 %   Platelets 127 (L) 150 - 400 K/uL   nRBC 0.0 0.0 - 0.2 %    Comment: Performed at [facility], [place], [place] [address]  Heparin level (unfractionated)     Status: None   Collection Time: 06/15/22  5:11 AM  Result Value Ref Range   Heparin Unfractionated 0.31 0.30 - 0.70 IU/mL    Comment: (NOTE) The clinical reportable range upper limit is being lowered to >1.10 to align with the FDA approved guidance for the current laboratory assay.  If heparin results are below expected values, and patient dosage has  been confirmed, suggest follow up testing of antithrombin III levels. Performed at [facility], [address] [place], [place] [address]   Hemoglobin and hematocrit, blood     Status: Abnormal   Collection Time: 06/15/22 11:08 AM  Result Value Ref Range   Hemoglobin 9.1 (L) 13.0 - 17.0 g/dL   HCT 28.2 (L) 39.0 - 52.0 %    Comment: Performed at [facility], [place], [place] [address]  Heparin level (unfractionated)     Status: None   Collection Time: 06/15/22 11:18 AM  Result Value Ref Range   Heparin Unfractionated 0.47 0.30 - 0.70 IU/mL    Comment: (NOTE) The clinical reportable range upper limit is being lowered to >1.10 to align with the FDA approved guidance for the current laboratory assay.  If heparin results are below expected values, and patient dosage has  been confirmed, suggest follow up testing of antithrombin III levels. Performed at [facility], [address] [place], [place] [address]   Hemoglobin and hematocrit, blood     Status: Abnormal   Collection Time: 06/15/22  5:23 PM  Result Value Ref Range   Hemoglobin 9.2 (L) 13.0 - 17.0 g/dL   HCT 28.3 (L) 39.0 - 52.0 %    Comment: Performed at  [facility], [address] [place], [place] [address]  Comprehensive metabolic panel     Status: Abnormal   Collection Time: 06/23/22  9:53 AM  Result Value Ref Range   Sodium 137 135 - 145 mmol/L   Potassium 3.7 3.5 - 5.1 mmol/L   Chloride 104 98 - 111 mmol/L   CO2 25 22 - 32 mmol/L   Glucose, Bld 227 (H) 70 - 99 mg/dL    Comment: Glucose reference range applies only to samples taken after fasting for at least 8 hours.   BUN 12 8 - 23 mg/dL   Creatinine, Ser 0.76 0.61 - 1.24 mg/dL   Calcium 8.1 (L) 8.9 - 10.3  mg/dL   Total Protein 6.4 (L) 6.5 - 8.1 g/dL   Albumin 2.6 (L) 3.5 - 5.0 g/dL   AST 34 15 - 41 U/L   ALT 17 0 - 44 U/L   Alkaline Phosphatase 119 38 - 126 U/L   Total Bilirubin 0.5 0.3 - 1.2 mg/dL   GFR, Estimated >60 >60 mL/min    Comment: (NOTE) Calculated using the CKD-EPI Creatinine Equation (2021)    Anion gap 8 5 - 15    Comment: Performed at [facility], [address] [place], [place] [address]  CBC with Differential     Status: Abnormal   Collection Time: 06/23/22  9:53 AM  Result Value Ref Range   WBC 2.7 (L) 4.0 - 10.5 K/uL   RBC 3.18 (L) 4.22 - 5.81 MIL/uL   Hemoglobin 10.5 (L) 13.0 - 17.0 g/dL   HCT 31.7 (L) 39.0 - 52.0 %   MCV 99.7 80.0 - 100.0 fL   MCH 33.0 26.0 - 34.0 pg   MCHC 33.1 30.0 - 36.0 g/dL   RDW 17.4 (H) 11.5 - 15.5 %   Platelets 180 150 - 400 K/uL   nRBC 0.0 0.0 - 0.2 %   Neutrophils Relative % 54 %   Neutro Abs 1.5 (L) 1.7 - 7.7 K/uL   Lymphocytes Relative 18 %   Lymphs Abs 0.5 (L) 0.7 - 4.0 K/uL   Monocytes Relative 19 %   Monocytes Absolute 0.5 0.1 - 1.0 K/uL   Eosinophils Relative 7 %   Eosinophils Absolute 0.2 0.0 - 0.5 K/uL   Basophils Relative 2 %   Basophils Absolute 0.0 0.0 - 0.1 K/uL   Immature Granulocytes 0 %   Abs Immature Granulocytes 0.01 0.00 - 0.07 K/uL    Comment: Performed at [facility], [address] [place], [place] [address]    Assessment/Plan:  Carotid stenosis The patient  has a high-grade left carotid artery stenosis with a recent stroke.  He is about a week and a half out so we could look at getting him on the schedule for potentially left carotid endarterectomy or left carotid stent placement next week.  I had a long discussion with he and his family today.  I discussed the differences between carotid endarterectomy and carotid stent placement.  I discussed the reason and rationale for having this fixed due to the reduced stroke risk.  In his case, with ongoing chemotherapy as well, we will need to time his repair on his off week from chemotherapy.  To reduce his recovery as well as minimize his healing with ongoing chemotherapy, carotid stenting would seem to be preferable in his case.  His anatomy appears to be acceptable for either carotid stenting or carotid endarterectomy.  He would for carotid stenting and that will also allow [place] not to hold his antiplatelet therapy after recent stroke which should be helpful.  He and his family voiced their understanding and desire to proceed.  Hypertension blood pressure control important in reducing the progression of atherosclerotic disease. On appropriate oral medications.   Stroke (cerebrum) ([place]) From his carotid stenosis  Diabetes mellitus type 2, uncomplicated (HCC) blood glucose control important in reducing the progression of atherosclerotic disease. Also, involved in wound healing. On appropriate medications.   Dyslipidemia blood glucose control important in reducing the progression of atherosclerotic disease. Also, involved in wound healing. On appropriate medications.      Leotis Pain 06/23/2022, 10:16 AM   This note was created with Dragon medical  transcription system.  Any errors from dictation are unintentional.

## 2022-06-23 NOTE — Assessment & Plan Note (Signed)
From his carotid stenosis

## 2022-06-23 NOTE — Telephone Encounter (Signed)
I attempted to contact the patient to schedule a left carotid stent placement with Dr. Lucky Cowboy. A message was left for a return call.

## 2022-06-23 NOTE — H&P (View-Only) (Signed)
Patient ID: Oscar [name], male   DOB: [birth year], 81 y.o.   MRN: [id number]  Chief Complaint  Patient presents with   New Patient (Initial Visit)    Consult from ER visit    HPI Oscar [name] is a 81 y.o. male.  I am asked to see the patient by Dr. Sloan Leiter for evaluation of carotid stenosis with stroke.  The patient was seen by covering physician about a week and a half ago when he came in on call with a stroke.  He still has some residual right hand weakness and discoordination from this left-sided stroke.  He underwent a CT angiogram of the neck which I have independently reviewed and shows a very high-grade stenosis of the left carotid artery.  There is some mild to moderate stenosis in the right carotid artery as well.  The patient had a previous episode about a year ago but not as severe.  He is currently undergoing chemotherapy for colon cancer and is actually scheduled for a treatment today.  He is now on aspirin, Plavix, and Lipitor and is tolerating these well.     Past Medical History:  Diagnosis Date   Arthritis    Cancer ([place])    colon cancer 02/2019 per pt    Family history of adverse reaction to anesthesia    cousin took all night to wake up from anesthesia   H/O colon cancer, stage IV    Hyperlipemia    Neuromuscular disorder ([place])    Pre-diabetes     Past Surgical History:  Procedure Laterality Date   COLON SURGERY     ESOPHAGOGASTRODUODENOSCOPY (EGD) WITH PROPOFOL N/A 12/26/2020   Procedure: ESOPHAGOGASTRODUODENOSCOPY (EGD) WITH PROPOFOL;  Surgeon: [name], MD;  Location: [facility] ENDOSCOPY;  Service: Gastroenterology;  Laterality: N/A;   IR ANGIOGRAM SELECTIVE EACH ADDITIONAL VESSEL  12/19/2020   IR ANGIOGRAM SELECTIVE EACH ADDITIONAL VESSEL  03/24/2021   IR ANGIOGRAM SELECTIVE EACH ADDITIONAL VESSEL  03/24/2021   IR ANGIOGRAM SELECTIVE EACH ADDITIONAL VESSEL  04/04/2021   IR ANGIOGRAM SELECTIVE EACH ADDITIONAL VESSEL  04/04/2021   IR ANGIOGRAM SELECTIVE EACH  ADDITIONAL VESSEL  04/04/2021   IR ANGIOGRAM VISCERAL SELECTIVE  12/19/2020   IR ANGIOGRAM VISCERAL SELECTIVE  03/24/2021   IR ANGIOGRAM VISCERAL SELECTIVE  04/04/2021   IR ANGIOGRAM VISCERAL SELECTIVE  04/04/2021   IR EMBO ARTERIAL NOT HEMORR HEMANG INC GUIDE ROADMAPPING  03/24/2021   IR EMBO TUMOR ORGAN ISCHEMIA INFARCT INC GUIDE ROADMAPPING  12/19/2020   IR EMBO TUMOR ORGAN ISCHEMIA INFARCT INC GUIDE ROADMAPPING  04/04/2021   IR IMAGING GUIDED PORT INSERTION  07/20/2019   IR RADIOLOGIST EVAL & MGMT  12/05/2020   IR RADIOLOGIST EVAL & MGMT  02/11/2021   IR RADIOLOGIST EVAL & MGMT  03/04/2021   IR RADIOLOGIST EVAL & MGMT  04/29/2021   IR RADIOLOGIST EVAL & MGMT  07/22/2021   IR RADIOLOGIST EVAL & MGMT  11/19/2021   IR US GUIDE VASC ACCESS RIGHT  12/19/2020   IR US GUIDE VASC ACCESS RIGHT  03/24/2021   IR US GUIDE VASC ACCESS RIGHT  04/04/2021   JOINT REPLACEMENT Left 2010   Knee   RADIOLOGY WITH ANESTHESIA N/A 01/15/2021   Procedure: CT WITH ANESTHESIA MICROWAVE ABLATION OF LIVER;  Surgeon: Criselda Peaches, MD;  Location: [facility] ORS;  Service: Anesthesiology;  Laterality: N/A;     Family History  Problem Relation Age of Onset   Peptic Ulcer Disease Father   No bleeding or clotting  disorders No aneurysms   Social History   Tobacco Use   Smoking status: Former    Packs/day: 0.25    Years: 15.00    Total pack years: 3.75    Types: Cigarettes   Smokeless tobacco: Never   Tobacco comments:    1 to 2 cigarettes a day occasionally  Vaping Use   Vaping Use: Never used  Substance Use Topics   Alcohol use: Yes    Comment: rarely    Drug use: No     Allergies  Allergen Reactions   Ace Inhibitors Swelling    Current Outpatient Medications  Medication Sig Dispense Refill   aspirin EC 81 MG tablet Take 1 tablet (81 mg total) by mouth daily. Swallow whole. 30 tablet 11   atorvastatin (LIPITOR) 20 MG tablet Take 1 tablet (20 mg total) by mouth daily. 30 tablet 0   clopidogrel (PLAVIX) 75 MG  tablet Take 1 tablet (75 mg total) by mouth daily. 30 tablet 11   oxyCODONE (OXY IR/ROXICODONE) 5 MG immediate release tablet Take 1 tablet (5 mg total) by mouth every 8 (eight) hours as needed for severe [name]. 60 tablet 0   tamsulosin (FLOMAX) 0.4 MG CAPS capsule Take 1 capsule (0.4 mg total) by mouth daily. (Patient not taking: Reported on 06/23/2022) 90 capsule 1   No current facility-administered medications for this visit.   Facility-Administered Medications Ordered in Other Visits  Medication Dose Route Frequency Provider Last Rate Last Admin   atropine 1 MG/ML injection            palonosetron (ALOXI) 0.25 MG/5ML injection               REVIEW OF SYSTEMS (Negative unless checked)  Constitutional: '[x]'$ Weight loss  '[]'$ Fever  '[]'$ Chills Cardiac: '[]'$ Chest [name]   '[]'$ Chest pressure   '[]'$ Palpitations   '[]'$ Shortness of breath when laying flat   '[]'$ Shortness of breath at rest   '[x]'$ Shortness of breath with exertion. Vascular:  '[]'$ [name] in legs with walking   '[]'$ [name] in legs at rest   '[]'$ [name] in legs when laying flat   '[]'$ Claudication   '[]'$ [name] in feet when walking  '[]'$ [name] in feet at rest  '[]'$ [name] in feet when laying flat   '[]'$ History of DVT   '[]'$ Phlebitis   '[]'$ Swelling in legs   '[]'$ Varicose veins   '[]'$ Non-healing ulcers Pulmonary:   '[x]'$ Uses home oxygen   '[]'$ Productive cough   '[]'$ Hemoptysis   '[]'$ Wheeze  '[x]'$ COPD   '[]'$ Asthma Neurologic:  '[]'$ Dizziness  '[]'$ Blackouts   '[]'$ Seizures   '[x]'$ History of stroke   '[]'$ History of TIA  '[]'$ Aphasia   '[]'$ Temporary blindness   '[]'$ Dysphagia   '[]'$ Weakness or numbness in arms   '[]'$ Weakness or numbness in legs Musculoskeletal:  '[]'$ Arthritis   '[]'$ Joint swelling   '[x]'$ Joint [name]   '[]'$ Low back [name] Hematologic:  '[]'$ Easy bruising  '[]'$ Easy bleeding   '[]'$ Hypercoagulable state   '[x]'$ Anemic  '[]'$ Hepatitis Gastrointestinal:  '[]'$ Blood in stool   '[]'$ Vomiting blood  '[]'$ Gastroesophageal reflux/heartburn   '[]'$ Abdominal [name] Genitourinary:  '[]'$ Chronic kidney disease   '[]'$ Difficult urination  '[]'$ Frequent urination  '[]'$ Burning with  urination   '[]'$ Hematuria Skin:  '[]'$ Rashes   '[]'$ Ulcers   '[]'$ Wounds Psychological:  '[]'$ History of anxiety   '[]'$  History of major depression.    Physical Exam BP 105/64 (BP Location: Right Arm)   Pulse 86   Resp 16   Wt 153 lb 3.2 oz (69.5 kg)   BMI 20.78 kg/m  Gen:  Thin, NAD.  Frail appearing Head: [place]/AT, No temporalis wasting.  Ear/Nose/Throat:  Hearing grossly intact, nares w/o erythema or drainage, oropharynx w/o Erythema/Exudate Eyes: Conjunctiva clear, sclera non-icteric  Neck: trachea midline.  No JVD.  Pulmonary:  Good air movement, respirations not labored, no use of accessory muscles  Cardiac: RRR, no JVD Vascular:  Vessel Right Left  Radial Palpable Palpable                                   Gastrointestinal:. No masses, surgical incisions, or scars. Musculoskeletal: M/S 5/5 throughout.  Extremities without ischemic changes.  No deformity or atrophy.  No significant lower extremity edema. Neurologic: Sensation grossly intact in extremities.  Symmetrical.  Speech is fluent. Motor exam as listed above. Psychiatric: Judgment intact, Mood & affect appropriate for pt's clinical situation. Dermatologic: No rashes or ulcers noted.  No cellulitis or open wounds.    Radiology ECHOCARDIOGRAM COMPLETE BUBBLE STUDY  Result Date: 06/14/2022    ECHOCARDIOGRAM REPORT   Patient Name:   Oscar [name] Date of Exam: 06/14/2022 Medical Rec #:  [id number]        Height:       72.0 in Accession #:    [id number]       Weight:       152.4 lb Date of Birth:  03-Feb-1941       BSA:          1.898 m Patient Age:    27 years         BP:           112/64 mmHg Patient Gender: M                HR:           53 bpm. Exam Location:  Inpatient Procedure: 2D Echo, Cardiac Doppler and Color Doppler Indications:     Stroke I63.9  History:         Patient has no prior history of Echocardiogram examinations.  Sonographer:     Darlina Sicilian RDCS Referring Phys:  [id number] JAN A MANSY Diagnosing Phys: Yolonda Kida MD IMPRESSIONS  1. Left ventricular ejection fraction, by estimation, is 65 to 70%. The left ventricle has normal function. The left ventricle has no regional wall motion abnormalities. Left ventricular diastolic parameters are consistent with Grade I diastolic dysfunction (impaired relaxation).  2. Right ventricular systolic function is normal. The right ventricular size is normal.  3. The mitral valve is normal in structure. No evidence of mitral valve regurgitation.  4. The aortic valve is normal in structure. Aortic valve regurgitation is not visualized. FINDINGS  Left Ventricle: Left ventricular ejection fraction, by estimation, is 65 to 70%. The left ventricle has normal function. The left ventricle has no regional wall motion abnormalities. The left ventricular internal cavity size was normal in size. There is  no left ventricular hypertrophy. Left ventricular diastolic parameters are consistent with Grade I diastolic dysfunction (impaired relaxation). Right Ventricle: The right ventricular size is normal. No increase in right ventricular wall thickness. Right ventricular systolic function is normal. Left Atrium: Left atrial size was normal in size. Right Atrium: Right atrial size was normal in size. Pericardium: There is no evidence of pericardial effusion. Mitral Valve: The mitral valve is normal in structure. No evidence of mitral valve regurgitation. Tricuspid Valve: The tricuspid valve is normal in structure. Tricuspid valve regurgitation is trivial. Aortic Valve: The aortic valve is normal in structure. Aortic valve regurgitation is not  visualized. Pulmonic Valve: The pulmonic valve was normal in structure. Pulmonic valve regurgitation is not visualized. Aorta: The ascending aorta was not well visualized. IAS/Shunts: No atrial level shunt detected by color flow Doppler.  LEFT VENTRICLE PLAX 2D LVIDd:         4.39 cm   Diastology LVIDs:         2.76 cm   LV e' medial:    3.92 cm/s LV PW:          0.89 cm   LV E/e' medial:  11.7 LV IVS:        1.06 cm   LV e' lateral:   5.77 cm/s LVOT diam:     2.20 cm   LV E/e' lateral: 7.9 LV SV:         60 LV SV Index:   32 LVOT Area:     3.80 cm  RIGHT VENTRICLE RV S prime:     11.20 cm/s TAPSE (M-mode): 2.4 cm LEFT ATRIUM           Index        RIGHT ATRIUM           Index LA diam:      2.60 cm 1.37 cm/m   RA Area:     12.00 cm LA Vol (A2C): 44.3 ml 23.34 ml/m  RA Volume:   29.80 ml  15.70 ml/m LA Vol (A4C): 22.4 ml 11.80 ml/m  AORTIC VALVE LVOT Vmax:   79.00 cm/s LVOT Vmean:  53.000 cm/s LVOT VTI:    0.158 m  AORTA Ao Root diam: 3.50 cm MITRAL VALVE MV Area (PHT): 1.48 cm    SHUNTS MV Decel Time: 512 msec    Systemic VTI:  0.16 m MV E velocity: 45.80 cm/s  Systemic Diam: 2.20 cm MV A velocity: 73.70 cm/s MV E/A ratio:  0.62 Oscar D Callwood MD Electronically signed by Yolonda Kida MD Signature Date/Time: 06/14/2022/4:47:10 PM    Final    CT ANGIO NECK W OR WO CONTRAST  Addendum Date: 06/14/2022   ADDENDUM REPORT: 06/14/2022 11:10 ADDENDUM: Study discussed by telephone with Dr. Frederich Chick LAMA on 06/14/2022 at 1101 hours. Electronically Signed   By: Genevie Ann M.D.   On: 06/14/2022 11:10   Result Date: 06/14/2022 CLINICAL DATA:  81 year old male with small vessel infarcts in the left basal ganglia and left occipital lobe white matter on brain MRI 2 days ago. Metastatic colon cancer. EXAM: CT ANGIOGRAPHY HEAD AND NECK TECHNIQUE: Multidetector CT imaging of the head and neck was performed using the standard protocol during bolus administration of intravenous contrast. Multiplanar CT image reconstructions and MIPs were obtained to evaluate the vascular anatomy. Carotid stenosis measurements (when applicable) are obtained utilizing NASCET criteria, using the distal internal carotid diameter as the denominator. RADIATION DOSE REDUCTION: This exam was performed according to the departmental dose-optimization program which includes automated exposure control,  adjustment of the mA and/or kV according to patient size and/or use of iterative reconstruction technique. CONTRAST:  159m OMNIPAQUE IOHEXOL 350 MG/ML SOLN COMPARISON:  Head CT and brain MRI 03/12/2022. Restaging CT Chest, Abdomen, and Pelvis 04/16/2022 FINDINGS: CT HEAD Brain: Calcified atherosclerosis at the skull base. No acute intracranial hemorrhage identified. No midline shift, mass effect, or evidence of intracranial mass lesion. Lacunar infarcts in the left basal ganglia and occipital lobe white matter remain occult by CT. Stable gray-white matter differentiation throughout the brain. Chronic posterosuperior left MCA territory encephalomalacia. Chronic lacunar infarct of the left caudate. Calvarium  and skull base: Negative. Paranasal sinuses: Visualized paranasal sinuses and mastoids are stable and well aerated. Orbits: Visualized orbits and scalp soft tissues are within normal limits. CTA NECK Skeleton: Absent dentition. Cervical spine degeneration with degenerative appearing ankylosis of C4-C5 facets. Bulky lower cervical endplate osteophytes with C6-C7 ankylosis. No acute osseous abnormality identified. Upper chest: Stable layering left pleural effusion. Numerous small upper lung nodules compatible with pulmonary metastatic disease appears stable since the June restaging exam. No superior mediastinal lymphadenopathy. Right chest Port-A-Cath remains in place. Other neck: No superimposed neck mass or lymphadenopathy identified. Aortic arch: Calcified aortic atherosclerosis. Possible four vessel arch configuration, the left vertebral artery arises at or near the arch on series 5, image 180. Calcified aortic atherosclerosis. Right carotid system: Brachiocephalic artery plaque without stenosis. Bulky soft plaque in the posterior right ICA origin, partially calcified at the bulb. Subsequent origin stenosis up to 60 % with respect to the distal vessel (series 9, image 70). Right ICA remains patent. Left carotid  system: Negative left CCA origins. Soft plaque in the medial left CCA before the bifurcation on series 5, image 124. No significant stenosis. Bulky soft plaque or thrombus and mild calcified plaque in the left ICA origin and bulb with subtotal occlusion, radiographic string sign stenosis (series 9, image 145). Subsequent decreased caliber of the downstream left ICA which remains patent to the skull base. Vertebral arteries: Proximal right subclavian artery plaque and tortuosity without stenosis. Normal right vertebral artery origin. Dominant right vertebral artery is patent to the skull base with no plaque or stenosis. Calcified atherosclerosis at the left vertebral artery origin which appears to be from the arch on series 8, image 182. Severe origin stenosis but the left vertebral artery remains patent. Non dominant left vertebral is patent to the skull base without additional plaque or stenosis. CTA HEAD Posterior circulation: No distal vertebral plaque or stenosis. Patent PICA origins and vertebrobasilar junction. Patent basilar artery without stenosis. SCA and PCA origins are normal. Posterior communicating arteries are diminutive or absent. Mild bilateral PCA P1 and P2 segment irregularity and stenosis greater on the left (series 10, image 67). Patent PCA branches. Anterior circulation: Both ICA siphons are patent. Moderate bilateral siphon calcified plaque. But no significant siphon stenosis. Normal ophthalmic artery origins. Patent carotid termini, MCA and ACA origins. Normal anterior communicating artery. Bilateral ACA branches are within normal limits. Left MCA M1 segment and bifurcation are patent without stenosis. Left MCA branches are within normal limits. Right MCA M1 segment and bifurcation are patent without stenosis. Right MCA branches are within normal limits. Venous sinuses: Patent. Anatomic variants: Left vertebral artery arises directly from the arch, 4 vessel arch configuration. Mildly dominant  right vertebral. Review of the MIP images confirms the above findings IMPRESSION: 1. Negative for large vessel occlusion but Positive for SUBTOTAL OCCLUSION of the Left ICA origin and bulb due to bulky soft plaque or thrombus. RADIOGRAPHIC STRING SIGN STENOSIS. 2. Up to 60% stenosis at the Right ICA origin due to bulky soft plaque. 3. 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 4. But no other significant arterial stenosis in the head or neck. Mild for age intracranial atherosclerosis. 5. Small vessel infarcts seen on MRI 2 days ago remain occult by CT. No new intracranial abnormality. 6. Stable layering left pleural effusion and pulmonary metastatic disease since June. 7. Aortic Atherosclerosis (ICD10-I70.0). Electronically Signed: By: Genevie Ann M.D. On: 06/14/2022 10:55   CT ANGIO HEAD W  OR WO CONTRAST  Addendum Date: 06/14/2022   ADDENDUM REPORT: 06/14/2022 11:10 ADDENDUM: Study discussed by telephone with Dr. Frederich Chick LAMA on 06/14/2022 at 1101 hours. Electronically Signed   By: Genevie Ann M.D.   On: 06/14/2022 11:10   Result Date: 06/14/2022 CLINICAL DATA:  81 year old male with small vessel infarcts in the left basal ganglia and left occipital lobe white matter on brain MRI 2 days ago. Metastatic colon cancer. EXAM: CT ANGIOGRAPHY HEAD AND NECK TECHNIQUE: Multidetector CT imaging of the head and neck was performed using the standard protocol during bolus administration of intravenous contrast. Multiplanar CT image reconstructions and MIPs were obtained to evaluate the vascular anatomy. Carotid stenosis measurements (when applicable) are obtained utilizing NASCET criteria, using the distal internal carotid diameter as the denominator. RADIATION DOSE REDUCTION: This exam was performed according to the departmental dose-optimization program which includes automated exposure control, adjustment of the mA and/or kV according to patient size and/or use of iterative  reconstruction technique. CONTRAST:  123m OMNIPAQUE IOHEXOL 350 MG/ML SOLN COMPARISON:  Head CT and brain MRI 03/12/2022. Restaging CT Chest, Abdomen, and Pelvis 04/16/2022 FINDINGS: CT HEAD Brain: Calcified atherosclerosis at the skull base. No acute intracranial hemorrhage identified. No midline shift, mass effect, or evidence of intracranial mass lesion. Lacunar infarcts in the left basal ganglia and occipital lobe white matter remain occult by CT. Stable gray-white matter differentiation throughout the brain. Chronic posterosuperior left MCA territory encephalomalacia. Chronic lacunar infarct of the left caudate. Calvarium and skull base: Negative. Paranasal sinuses: Visualized paranasal sinuses and mastoids are stable and well aerated. Orbits: Visualized orbits and scalp soft tissues are within normal limits. CTA NECK Skeleton: Absent dentition. Cervical spine degeneration with degenerative appearing ankylosis of C4-C5 facets. Bulky lower cervical endplate osteophytes with C6-C7 ankylosis. No acute osseous abnormality identified. Upper chest: Stable layering left pleural effusion. Numerous small upper lung nodules compatible with pulmonary metastatic disease appears stable since the June restaging exam. No superior mediastinal lymphadenopathy. Right chest Port-A-Cath remains in place. Other neck: No superimposed neck mass or lymphadenopathy identified. Aortic arch: Calcified aortic atherosclerosis. Possible four vessel arch configuration, the left vertebral artery arises at or near the arch on series 5, image 180. Calcified aortic atherosclerosis. Right carotid system: Brachiocephalic artery plaque without stenosis. Bulky soft plaque in the posterior right ICA origin, partially calcified at the bulb. Subsequent origin stenosis up to 60 % with respect to the distal vessel (series 9, image 70). Right ICA remains patent. Left carotid system: Negative left CCA origins. Soft plaque in the medial left CCA before the  bifurcation on series 5, image 124. No significant stenosis. Bulky soft plaque or thrombus and mild calcified plaque in the left ICA origin and bulb with subtotal occlusion, radiographic string sign stenosis (series 9, image 145). Subsequent decreased caliber of the downstream left ICA which remains patent to the skull base. Vertebral arteries: Proximal right subclavian artery plaque and tortuosity without stenosis. Normal right vertebral artery origin. Dominant right vertebral artery is patent to the skull base with no plaque or stenosis. Calcified atherosclerosis at the left vertebral artery origin which appears to be from the arch on series 8, image 182. Severe origin stenosis but the left vertebral artery remains patent. Non dominant left vertebral is patent to the skull base without additional plaque or stenosis. CTA HEAD Posterior circulation: No distal vertebral plaque or stenosis. Patent PICA origins and vertebrobasilar junction. Patent basilar artery without stenosis. SCA and PCA origins are normal. Posterior communicating arteries are diminutive  or absent. Mild bilateral PCA P1 and P2 segment irregularity and stenosis greater on the left (series 10, image 67). Patent PCA branches. Anterior circulation: Both ICA siphons are patent. Moderate bilateral siphon calcified plaque. But no significant siphon stenosis. Normal ophthalmic artery origins. Patent carotid termini, MCA and ACA origins. Normal anterior communicating artery. Bilateral ACA branches are within normal limits. Left MCA M1 segment and bifurcation are patent without stenosis. Left MCA branches are within normal limits. Right MCA M1 segment and bifurcation are patent without stenosis. Right MCA branches are within normal limits. Venous sinuses: Patent. Anatomic variants: Left vertebral artery arises directly from the arch, 4 vessel arch configuration. Mildly dominant right vertebral. Review of the MIP images confirms the above findings IMPRESSION:  1. Negative for large vessel occlusion but Positive for SUBTOTAL OCCLUSION of the Left ICA origin and bulb due to bulky soft plaque or thrombus. RADIOGRAPHIC STRING SIGN STENOSIS. 2. Up to 60% stenosis at the Right ICA origin due to bulky soft plaque. 3. 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 4. But no other significant arterial stenosis in the head or neck. Mild for age intracranial atherosclerosis. 5. Small vessel infarcts seen on MRI 2 days ago remain occult by CT. No new intracranial abnormality. 6. Stable layering left pleural effusion and pulmonary metastatic disease since June. 7. Aortic Atherosclerosis (ICD10-I70.0). Electronically Signed: By: Genevie Ann M.D. On: 06/14/2022 10:55   US Carotid Bilateral (at [facility] and [facility] only)  Result Date: 06/13/2022 CLINICAL DATA:  Stroke Syncope Hyperlipidemia Diabetes Tobacco abuse EXAM: BILATERAL CAROTID DUPLEX ULTRASOUND TECHNIQUE: Pearline Cables scale imaging, color Doppler and duplex ultrasound were performed of bilateral carotid and vertebral arteries in the neck. COMPARISON:  None available FINDINGS: Criteria: Quantification of carotid stenosis is based on velocity parameters that correlate the residual internal carotid diameter with NASCET-based stenosis levels, using the diameter of the distal internal carotid lumen as the denominator for stenosis measurement. The following velocity measurements were obtained: RIGHT ICA: 145/44 cm/sec CCA: 25/05 cm/sec SYSTOLIC ICA/CCA RATIO:  1.8 ECA: 107 cm/sec LEFT ICA: 352/100 cm/sec CCA: 39/7 cm/sec SYSTOLIC ICA/CCA RATIO:  7.3 ECA: 91 cm/sec RIGHT CAROTID ARTERY: Moderate focal heterogeneous plaque noted in the carotid bulb. RIGHT VERTEBRAL ARTERY:  Antegrade flow. LEFT CAROTID ARTERY: Extensive heterogeneous plaque of the left internal carotid artery origin. LEFT VERTEBRAL ARTERY:  Antegrade flow. IMPRESSION: 1. 50-69% stenosis of the right internal carotid artery. 2. Greater  than 70% stenosis of the left carotid artery. 3. Further evaluation with CT angiography of the neck should be performed to better quantify plaque burden and degree of stenosis. Electronically Signed   By: Miachel Roux M.D.   On: 06/13/2022 15:02   MR BRAIN WO CONTRAST  Result Date: 06/12/2022 CLINICAL DATA:  Right-sided weakness EXAM: MRI HEAD WITHOUT CONTRAST TECHNIQUE: Multiplanar, multiecho pulse sequences of the brain and surrounding structures were obtained without intravenous contrast. COMPARISON:  None Available. FINDINGS: Brain: There are small foci of acute/subacute ischemia the left internal capsule posterior limb and in the left occipital lobe. No acute or chronic hemorrhage. There is multifocal hyperintense T2-weighted signal within the white matter. Generalized volume loss. Old left basal ganglia small vessel infarct. The midline structures are normal. Vascular: Major flow voids are preserved. Skull and upper cervical spine: Normal calvarium and skull base. Visualized upper cervical spine and soft tissues are normal. Sinuses/Orbits:No paranasal sinus fluid levels or advanced mucosal thickening. No mastoid or middle ear effusion. Normal orbits.  IMPRESSION: Small foci of acute/subacute ischemia in the left internal capsule posterior limb and left occipital lobe. No hemorrhage or mass effect. Electronically Signed   By: Ulyses Jarred M.D.   On: 06/12/2022 21:01   CT HEAD WO CONTRAST  Result Date: 06/12/2022 CLINICAL DATA:  Neurological deficit EXAM: CT HEAD WITHOUT CONTRAST TECHNIQUE: Contiguous axial images were obtained from the base of the skull through the vertex without intravenous contrast. RADIATION DOSE REDUCTION: This exam was performed according to the departmental dose-optimization program which includes automated exposure control, adjustment of the mA and/or kV according to patient size and/or use of iterative reconstruction technique. COMPARISON:  Head CT dated November 09, 2020  FINDINGS: Brain: Old lacunar infarcts of the bilateral basal ganglia. No evidence of acute infarction, hemorrhage, hydrocephalus, extra-axial collection or mass lesion/mass effect. Vascular: No hyperdense vessel or unexpected calcification. Skull: Normal. Negative for fracture or focal lesion. Sinuses/Orbits: No acute finding. Other: None. IMPRESSION: No acute intracranial abnormality. Electronically Signed   By: Yetta Glassman M.D.   On: 06/12/2022 17:46    Labs Recent Results (from the past 2160 hour(s))  Protein / creatinine ratio, urine     Status: None   Collection Time: 04/06/22  8:29 AM  Result Value Ref Range   Creatinine, Urine 345 mg/dL   Total Protein, Urine 26 mg/dL    Comment: NO NORMAL RANGE ESTABLISHED FOR THIS TEST   Protein Creatinine Ratio 0.08 0.00 - 0.15 mg/mg[Cre]    Comment: Performed at [facility], [address] [place], [place] [address]  CEA     Status: Abnormal   Collection Time: 04/06/22  8:29 AM  Result Value Ref Range   CEA 138.0 (H) 0.0 - 4.7 ng/mL    Comment: (NOTE)                             Nonsmokers          <3.9                             Smokers             <5.6 [employer] Electrochemiluminescence Immunoassay (ECLIA) Values obtained with different assay methods or kits cannot be used interchangeably.  Results cannot be interpreted as absolute evidence of the presence or absence of malignant disease. Performed At: [facility] [place], [place] [id number] Rush Farmer MD [id number]   Comprehensive metabolic panel     Status: Abnormal   Collection Time: 04/06/22  8:29 AM  Result Value Ref Range   Sodium 141 135 - 145 mmol/L   Potassium 3.7 3.5 - 5.1 mmol/L   Chloride 109 98 - 111 mmol/L   CO2 25 22 - 32 mmol/L   Glucose, Bld 136 (H) 70 - 99 mg/dL    Comment: Glucose reference range applies only to samples taken after fasting for at least 8 hours.   BUN 13 8 - 23 mg/dL   Creatinine, Ser 0.86  0.61 - 1.24 mg/dL   Calcium 8.2 (L) 8.9 - 10.3 mg/dL   Total Protein 6.9 6.5 - 8.1 g/dL   Albumin 2.7 (L) 3.5 - 5.0 g/dL   AST 33 15 - 41 U/L   ALT 21 0 - 44 U/L   Alkaline Phosphatase 160 (H) 38 - 126 U/L   Total Bilirubin 0.6 0.3 - 1.2 mg/dL   GFR, Estimated >60 >  60 mL/min    Comment: (NOTE) Calculated using the CKD-EPI Creatinine Equation (2021)    Anion gap 7 5 - 15    Comment: Performed at [facility], [address] [place], [place] [address]  CBC with Differential     Status: Abnormal   Collection Time: 04/06/22  8:29 AM  Result Value Ref Range   WBC 4.2 4.0 - 10.5 K/uL   RBC 3.31 (L) 4.22 - 5.81 MIL/uL   Hemoglobin 10.9 (L) 13.0 - 17.0 g/dL   HCT 33.0 (L) 39.0 - 52.0 %   MCV 99.7 80.0 - 100.0 fL   MCH 32.9 26.0 - 34.0 pg   MCHC 33.0 30.0 - 36.0 g/dL   RDW 17.2 (H) 11.5 - 15.5 %   Platelets 176 150 - 400 K/uL   nRBC 0.0 0.0 - 0.2 %   Neutrophils Relative % 62 %   Neutro Abs 2.6 1.7 - 7.7 K/uL   Lymphocytes Relative 19 %   Lymphs Abs 0.8 0.7 - 4.0 K/uL   Monocytes Relative 10 %   Monocytes Absolute 0.4 0.1 - 1.0 K/uL   Eosinophils Relative 8 %   Eosinophils Absolute 0.3 0.0 - 0.5 K/uL   Basophils Relative 1 %   Basophils Absolute 0.0 0.0 - 0.1 K/uL   Immature Granulocytes 0 %   Abs Immature Granulocytes 0.01 0.00 - 0.07 K/uL    Comment: Performed at [facility], [address] [place], [place] [address]  CEA     Status: Abnormal   Collection Time: 04/20/22  9:40 AM  Result Value Ref Range   CEA 86.4 (H) 0.0 - 4.7 ng/mL    Comment: (NOTE)                             Nonsmokers          <3.9                             Smokers             <5.6 [employer] Electrochemiluminescence Immunoassay (ECLIA) Values obtained with different assay methods or kits cannot be used interchangeably.  Results cannot be interpreted as absolute evidence of the presence or absence of malignant disease. Performed At: [facility] [place], [place] [id number] Rush Farmer MD [id number]   Comprehensive metabolic panel     Status: Abnormal   Collection Time: 04/20/22  9:40 AM  Result Value Ref Range   Sodium 138 135 - 145 mmol/L   Potassium 3.9 3.5 - 5.1 mmol/L   Chloride 110 98 - 111 mmol/L   CO2 25 22 - 32 mmol/L   Glucose, Bld 138 (H) 70 - 99 mg/dL    Comment: Glucose reference range applies only to samples taken after fasting for at least 8 hours.   BUN 14 8 - 23 mg/dL   Creatinine, Ser 0.84 0.61 - 1.24 mg/dL   Calcium 8.0 (L) 8.9 - 10.3 mg/dL   Total Protein 6.3 (L) 6.5 - 8.1 g/dL   Albumin 2.5 (L) 3.5 - 5.0 g/dL   AST 29 15 - 41 U/L   ALT 19 0 - 44 U/L   Alkaline Phosphatase 162 (H) 38 - 126 U/L   Total Bilirubin 0.4 0.3 - 1.2 mg/dL   GFR, Estimated >60 >60 mL/min    Comment: (NOTE) Calculated using the CKD-EPI  Creatinine Equation (2021)    Anion gap 3 (L) 5 - 15    Comment: Performed at [facility], [address] [place], [place] [address]  CBC with Differential     Status: Abnormal   Collection Time: 04/20/22  9:40 AM  Result Value Ref Range   WBC 3.6 (L) 4.0 - 10.5 K/uL   RBC 2.94 (L) 4.22 - 5.81 MIL/uL   Hemoglobin 9.8 (L) 13.0 - 17.0 g/dL   HCT 29.9 (L) 39.0 - 52.0 %   MCV 101.7 (H) 80.0 - 100.0 fL   MCH 33.3 26.0 - 34.0 pg   MCHC 32.8 30.0 - 36.0 g/dL   RDW 17.1 (H) 11.5 - 15.5 %   Platelets 144 (L) 150 - 400 K/uL   nRBC 0.0 0.0 - 0.2 %   Neutrophils Relative % 64 %   Neutro Abs 2.3 1.7 - 7.7 K/uL   Lymphocytes Relative 16 %   Lymphs Abs 0.6 (L) 0.7 - 4.0 K/uL   Monocytes Relative 11 %   Monocytes Absolute 0.4 0.1 - 1.0 K/uL   Eosinophils Relative 8 %   Eosinophils Absolute 0.3 0.0 - 0.5 K/uL   Basophils Relative 1 %   Basophils Absolute 0.0 0.0 - 0.1 K/uL   Immature Granulocytes 0 %   Abs Immature Granulocytes 0.01 0.00 - 0.07 K/uL    Comment: Performed at [facility], [address] [place], [place] [address]  Protein / creatinine ratio, urine     Status:  None   Collection Time: 04/20/22  9:43 AM  Result Value Ref Range   Creatinine, Urine 320 mg/dL   Total Protein, Urine 20 mg/dL    Comment: NO NORMAL RANGE ESTABLISHED FOR THIS TEST   Protein Creatinine Ratio 0.06 0.00 - 0.15 mg/mg[Cre]    Comment: Performed at [facility], [address] [place], [place] [address]  CEA     Status: Abnormal   Collection Time: 05/04/22  8:26 AM  Result Value Ref Range   CEA 79.8 (H) 0.0 - 4.7 ng/mL    Comment: (NOTE)                             Nonsmokers          <3.9                             Smokers             <5.6 [employer] Electrochemiluminescence Immunoassay (ECLIA) Values obtained with different assay methods or kits cannot be used interchangeably.  Results cannot be interpreted as absolute evidence of the presence or absence of malignant disease. Performed At: [facility] [place], [place] [id number] Rush Farmer MD [id number]   Comprehensive metabolic panel     Status: Abnormal   Collection Time: 05/04/22  8:26 AM  Result Value Ref Range   Sodium 139 135 - 145 mmol/L   Potassium 3.9 3.5 - 5.1 mmol/L   Chloride 110 98 - 111 mmol/L   CO2 26 22 - 32 mmol/L   Glucose, Bld 139 (H) 70 - 99 mg/dL    Comment: Glucose reference range applies only to samples taken after fasting for at least 8 hours.   BUN 12 8 - 23 mg/dL   Creatinine, Ser 0.85 0.61 - 1.24 mg/dL   Calcium 8.1 (L) 8.9 - 10.3 mg/dL  Total Protein 6.7 6.5 - 8.1 g/dL   Albumin 2.7 (L) 3.5 - 5.0 g/dL   AST 32 15 - 41 U/L   ALT 18 0 - 44 U/L   Alkaline Phosphatase 152 (H) 38 - 126 U/L   Total Bilirubin 1.1 0.3 - 1.2 mg/dL   GFR, Estimated >60 >60 mL/min    Comment: (NOTE) Calculated using the CKD-EPI Creatinine Equation (2021)    Anion gap 3 (L) 5 - 15    Comment: Performed at [facility], [address] [place], [place] [address]  CBC with Differential     Status: Abnormal   Collection Time: 05/04/22  8:26 AM   Result Value Ref Range   WBC 4.7 4.0 - 10.5 K/uL   RBC 3.39 (L) 4.22 - 5.81 MIL/uL   Hemoglobin 11.1 (L) 13.0 - 17.0 g/dL   HCT 34.3 (L) 39.0 - 52.0 %   MCV 101.2 (H) 80.0 - 100.0 fL   MCH 32.7 26.0 - 34.0 pg   MCHC 32.4 30.0 - 36.0 g/dL   RDW 16.5 (H) 11.5 - 15.5 %   Platelets 179 150 - 400 K/uL   nRBC 0.0 0.0 - 0.2 %   Neutrophils Relative % 61 %   Neutro Abs 2.9 1.7 - 7.7 K/uL   Lymphocytes Relative 20 %   Lymphs Abs 0.9 0.7 - 4.0 K/uL   Monocytes Relative 11 %   Monocytes Absolute 0.5 0.1 - 1.0 K/uL   Eosinophils Relative 7 %   Eosinophils Absolute 0.3 0.0 - 0.5 K/uL   Basophils Relative 1 %   Basophils Absolute 0.1 0.0 - 0.1 K/uL   Immature Granulocytes 0 %   Abs Immature Granulocytes 0.01 0.00 - 0.07 K/uL    Comment: Performed at [facility], [address] [place], [place] [address]  Protein / creatinine ratio, urine     Status: None   Collection Time: 05/20/22  8:19 AM  Result Value Ref Range   Creatinine, Urine 79 mg/dL   Total Protein, Urine 11 mg/dL    Comment: NO NORMAL RANGE ESTABLISHED FOR THIS TEST   Protein Creatinine Ratio 0.14 0.00 - 0.15 mg/mg[Cre]    Comment: Performed at [facility], [address] [place], [place] [address]  CBC with Differential     Status: Abnormal   Collection Time: 05/20/22  8:19 AM  Result Value Ref Range   WBC 4.3 4.0 - 10.5 K/uL   RBC 3.38 (L) 4.22 - 5.81 MIL/uL   Hemoglobin 11.1 (L) 13.0 - 17.0 g/dL   HCT 34.2 (L) 39.0 - 52.0 %   MCV 101.2 (H) 80.0 - 100.0 fL   MCH 32.8 26.0 - 34.0 pg   MCHC 32.5 30.0 - 36.0 g/dL   RDW 16.0 (H) 11.5 - 15.5 %   Platelets 206 150 - 400 K/uL   nRBC 0.0 0.0 - 0.2 %   Neutrophils Relative % 55 %   Neutro Abs 2.4 1.7 - 7.7 K/uL   Lymphocytes Relative 24 %   Lymphs Abs 1.0 0.7 - 4.0 K/uL   Monocytes Relative 13 %   Monocytes Absolute 0.6 0.1 - 1.0 K/uL   Eosinophils Relative 7 %   Eosinophils Absolute 0.3 0.0 - 0.5 K/uL   Basophils Relative 1 %   Basophils Absolute  0.0 0.0 - 0.1 K/uL   Immature Granulocytes 0 %   Abs Immature Granulocytes 0.01 0.00 - 0.07 K/uL    Comment: Performed at [facility], [address] [place], [place] [address]  Comprehensive metabolic panel     Status: Abnormal   Collection Time: 05/20/22  8:19 AM  Result Value Ref Range   Sodium 140 135 - 145 mmol/L   Potassium 3.8 3.5 - 5.1 mmol/L   Chloride 110 98 - 111 mmol/L   CO2 26 22 - 32 mmol/L   Glucose, Bld 158 (H) 70 - 99 mg/dL    Comment: Glucose reference range applies only to samples taken after fasting for at least 8 hours.   BUN 13 8 - 23 mg/dL   Creatinine, Ser 0.75 0.61 - 1.24 mg/dL   Calcium 8.5 (L) 8.9 - 10.3 mg/dL   Total Protein 7.0 6.5 - 8.1 g/dL   Albumin 2.9 (L) 3.5 - 5.0 g/dL   AST 35 15 - 41 U/L   ALT 18 0 - 44 U/L   Alkaline Phosphatase 171 (H) 38 - 126 U/L   Total Bilirubin 0.8 0.3 - 1.2 mg/dL   GFR, Estimated >60 >60 mL/min    Comment: (NOTE) Calculated using the CKD-EPI Creatinine Equation (2021)    Anion gap 4 (L) 5 - 15    Comment: Performed at [facility], [address] [place], [place] [address]  CEA     Status: Abnormal   Collection Time: 05/20/22  8:19 AM  Result Value Ref Range   CEA 69.3 (H) 0.0 - 4.7 ng/mL    Comment: (NOTE)                             Nonsmokers          <3.9                             Smokers             <5.6 [employer] Electrochemiluminescence Immunoassay (ECLIA) Values obtained with different assay methods or kits cannot be used interchangeably.  Results cannot be interpreted as absolute evidence of the presence or absence of malignant disease. Performed At: [facility] [place], [place] [id number] Rush Farmer MD [id number]   CEA     Status: Abnormal   Collection Time: 06/03/22  8:22 AM  Result Value Ref Range   CEA 61.5 (H) 0.0 - 4.7 ng/mL    Comment: (NOTE)                             Nonsmokers          <3.9                             Smokers              <5.6 [employer] Electrochemiluminescence Immunoassay (ECLIA) Values obtained with different assay methods or kits cannot be used interchangeably.  Results cannot be interpreted as absolute evidence of the presence or absence of malignant disease. Performed At: [facility] [place], [place] [id number] Rush Farmer MD [id number]   Comprehensive metabolic panel     Status: Abnormal   Collection Time: 06/03/22  8:22 AM  Result Value Ref Range   Sodium 139 135 - 145 mmol/L   Potassium 4.2 3.5 - 5.1 mmol/L   Chloride 109 98 - 111 mmol/L   CO2 25 22 - 32 mmol/L  Glucose, Bld 177 (H) 70 - 99 mg/dL    Comment: Glucose reference range applies only to samples taken after fasting for at least 8 hours.   BUN 14 8 - 23 mg/dL   Creatinine, Ser 0.89 0.61 - 1.24 mg/dL   Calcium 8.0 (L) 8.9 - 10.3 mg/dL   Total Protein 6.8 6.5 - 8.1 g/dL   Albumin 2.7 (L) 3.5 - 5.0 g/dL   AST 35 15 - 41 U/L   ALT 19 0 - 44 U/L   Alkaline Phosphatase 156 (H) 38 - 126 U/L   Total Bilirubin 0.8 0.3 - 1.2 mg/dL   GFR, Estimated >60 >60 mL/min    Comment: (NOTE) Calculated using the CKD-EPI Creatinine Equation (2021)    Anion gap 5 5 - 15    Comment: Performed at [facility], [address] [place], [place] [address]  CBC with Differential     Status: Abnormal   Collection Time: 06/03/22  8:22 AM  Result Value Ref Range   WBC 4.4 4.0 - 10.5 K/uL   RBC 3.25 (L) 4.22 - 5.81 MIL/uL   Hemoglobin 10.6 (L) 13.0 - 17.0 g/dL   HCT 32.6 (L) 39.0 - 52.0 %   MCV 100.3 (H) 80.0 - 100.0 fL   MCH 32.6 26.0 - 34.0 pg   MCHC 32.5 30.0 - 36.0 g/dL   RDW 16.3 (H) 11.5 - 15.5 %   Platelets 172 150 - 400 K/uL   nRBC 0.0 0.0 - 0.2 %   Neutrophils Relative % 65 %   Neutro Abs 2.9 1.7 - 7.7 K/uL   Lymphocytes Relative 17 %   Lymphs Abs 0.8 0.7 - 4.0 K/uL   Monocytes Relative 11 %   Monocytes Absolute 0.5 0.1 - 1.0 K/uL   Eosinophils Relative 5 %   Eosinophils Absolute  0.2 0.0 - 0.5 K/uL   Basophils Relative 1 %   Basophils Absolute 0.0 0.0 - 0.1 K/uL   Immature Granulocytes 1 %   Abs Immature Granulocytes 0.02 0.00 - 0.07 K/uL    Comment: Performed at [facility], [address] [place], [place] [address]  Protein / creatinine ratio, urine     Status: None   Collection Time: 06/03/22  9:15 AM  Result Value Ref Range   Creatinine, Urine 350 mg/dL   Total Protein, Urine 16 mg/dL    Comment: NO NORMAL RANGE ESTABLISHED FOR THIS TEST   Protein Creatinine Ratio 0.05 0.00 - 0.15 mg/mg[Cre]    Comment: Performed at [facility], [address] [place], [place] [address]  CBG monitoring, ED     Status: None   Collection Time: 06/12/22  4:41 PM  Result Value Ref Range   Glucose-Capillary 84 70 - 99 mg/dL    Comment: Glucose reference range applies only to samples taken after fasting for at least 8 hours.  Protime-INR     Status: None   Collection Time: 06/12/22  4:56 PM  Result Value Ref Range   Prothrombin Time 15.1 11.4 - 15.2 seconds   INR 1.2 0.8 - 1.2    Comment: (NOTE) INR goal varies based on device and disease states. Performed at [facility], [place]., [place], [place] [address]   APTT     Status: None   Collection Time: 06/12/22  4:56 PM  Result Value Ref Range   aPTT 31 24 - 36 seconds    Comment: Performed at [facility], [place], [place]  [address]  CBC     Status: Abnormal   Collection Time: 06/12/22  4:56 PM  Result Value Ref Range   WBC 3.1 (L) 4.0 - 10.5 K/uL   RBC 3.34 (L) 4.22 - 5.81 MIL/uL   Hemoglobin 10.6 (L) 13.0 - 17.0 g/dL   HCT 33.5 (L) 39.0 - 52.0 %   MCV 100.3 (H) 80.0 - 100.0 fL   MCH 31.7 26.0 - 34.0 pg   MCHC 31.6 30.0 - 36.0 g/dL   RDW 16.2 (H) 11.5 - 15.5 %   Platelets 180 150 - 400 K/uL   nRBC 0.0 0.0 - 0.2 %    Comment: Performed at [facility], [address] [place], [place] [address]  Differential     Status: None   Collection  Time: 06/12/22  4:56 PM  Result Value Ref Range   Neutrophils Relative % 60 %   Neutro Abs 1.8 1.7 - 7.7 K/uL   Lymphocytes Relative 21 %   Lymphs Abs 0.7 0.7 - 4.0 K/uL   Monocytes Relative 13 %   Monocytes Absolute 0.4 0.1 - 1.0 K/uL   Eosinophils Relative 5 %   Eosinophils Absolute 0.2 0.0 - 0.5 K/uL   Basophils Relative 1 %   Basophils Absolute 0.0 0.0 - 0.1 K/uL   Immature Granulocytes 0 %   Abs Immature Granulocytes 0.01 0.00 - 0.07 K/uL    Comment: Performed at [facility], [address] [place], [place] [address]  Comprehensive metabolic panel     Status: Abnormal   Collection Time: 06/12/22  4:56 PM  Result Value Ref Range   Sodium 140 135 - 145 mmol/L   Potassium 4.2 3.5 - 5.1 mmol/L   Chloride 108 98 - 111 mmol/L   CO2 28 22 - 32 mmol/L   Glucose, Bld 101 (H) 70 - 99 mg/dL    Comment: Glucose reference range applies only to samples taken after fasting for at least 8 hours.   BUN 13 8 - 23 mg/dL   Creatinine, Ser 0.75 0.61 - 1.24 mg/dL   Calcium 8.4 (L) 8.9 - 10.3 mg/dL   Total Protein 7.1 6.5 - 8.1 g/dL   Albumin 3.0 (L) 3.5 - 5.0 g/dL   AST 32 15 - 41 U/L   ALT 22 0 - 44 U/L   Alkaline Phosphatase 156 (H) 38 - 126 U/L   Total Bilirubin 0.9 0.3 - 1.2 mg/dL   GFR, Estimated >60 >60 mL/min    Comment: (NOTE) Calculated using the CKD-EPI Creatinine Equation (2021)    Anion gap 4 (L) 5 - 15    Comment: Performed at [facility], [address], [place], [place] [address]  Ethanol     Status: None   Collection Time: 06/12/22  4:56 PM  Result Value Ref Range   Alcohol, Ethyl (B) <10 <10 mg/dL    Comment: (NOTE) Lowest detectable limit for serum alcohol is 10 mg/dL.  For medical purposes only. Performed at [facility], [address] [place], [place] [address]   Hemoglobin A1c     Status: None   Collection Time: 06/12/22  4:56 PM  Result Value Ref Range   Hgb A1c MFr Bld 5.4 4.8 - 5.6 %    Comment: (NOTE) Pre diabetes:           5.7%-6.4%  Diabetes:              >6.4%  Glycemic control for   <7.0% adults with diabetes  Mean Plasma Glucose 108.28 mg/dL    Comment: Performed at [facility] [address] [place], [place] [address]  Lipid panel     Status: Abnormal   Collection Time: 06/13/22  4:45 AM  Result Value Ref Range   Cholesterol 126 0 - 200 mg/dL   Triglycerides 36 <150 mg/dL   HDL 40 (L) >40 mg/dL   Total CHOL/HDL Ratio 3.2 RATIO   VLDL 7 0 - 40 mg/dL   LDL Cholesterol 79 0 - 99 mg/dL    Comment:        Total Cholesterol/HDL:CHD Risk Coronary Heart Disease Risk Table                     Men   Women  1/2 Average Risk   3.4   3.3  Average Risk       5.0   4.4  2 X Average Risk   9.6   7.1  3 X Average Risk  23.4   11.0        Use the calculated Patient Ratio above and the CHD Risk Table to determine the patient's CHD Risk.        ATP III CLASSIFICATION (LDL):  <100     mg/dL   Optimal  100-129  mg/dL   Near or Above                    Optimal  130-159  mg/dL   Borderline  160-189  mg/dL   High  >190     mg/dL   Very High Performed at [facility], [address] [place], [place] [address]   Basic metabolic panel     Status: Abnormal   Collection Time: 06/13/22  4:45 AM  Result Value Ref Range   Sodium 138 135 - 145 mmol/L   Potassium 3.9 3.5 - 5.1 mmol/L   Chloride 111 98 - 111 mmol/L   CO2 24 22 - 32 mmol/L   Glucose, Bld 93 70 - 99 mg/dL    Comment: Glucose reference range applies only to samples taken after fasting for at least 8 hours.   BUN 12 8 - 23 mg/dL   Creatinine, Ser 0.68 0.61 - 1.24 mg/dL   Calcium 7.9 (L) 8.9 - 10.3 mg/dL   GFR, Estimated >60 >60 mL/min    Comment: (NOTE) Calculated using the CKD-EPI Creatinine Equation (2021)    Anion gap 3 (L) 5 - 15    Comment: Performed at [facility], [address] [place], [place] [address]  CBC     Status: Abnormal   Collection Time: 06/13/22  4:45 AM  Result Value Ref Range   WBC  2.9 (L) 4.0 - 10.5 K/uL   RBC 2.96 (L) 4.22 - 5.81 MIL/uL   Hemoglobin 9.4 (L) 13.0 - 17.0 g/dL   HCT 28.5 (L) 39.0 - 52.0 %   MCV 96.3 80.0 - 100.0 fL   MCH 31.8 26.0 - 34.0 pg   MCHC 33.0 30.0 - 36.0 g/dL   RDW 16.4 (H) 11.5 - 15.5 %   Platelets 127 (L) 150 - 400 K/uL   nRBC 0.0 0.0 - 0.2 %    Comment: Performed at [facility], [address] [place],  [address]  Occult blood card to lab, stool     Status: Abnormal   Collection Time: 06/13/22  7:30 PM  Result Value Ref Range   Fecal Occult Bld POSITIVE (A) NEGATIVE  Comment: Performed at [facility], [address] [place], [place] [address]  CBC     Status: Abnormal   Collection Time: 06/14/22  5:06 AM  Result Value Ref Range   WBC 3.0 (L) 4.0 - 10.5 K/uL   RBC 2.89 (L) 4.22 - 5.81 MIL/uL   Hemoglobin 9.2 (L) 13.0 - 17.0 g/dL   HCT 28.0 (L) 39.0 - 52.0 %   MCV 96.9 80.0 - 100.0 fL   MCH 31.8 26.0 - 34.0 pg   MCHC 32.9 30.0 - 36.0 g/dL   RDW 16.0 (H) 11.5 - 15.5 %   Platelets 126 (L) 150 - 400 K/uL   nRBC 0.0 0.0 - 0.2 %    Comment: Performed at [facility], [address] [place], [place] [address]  Basic metabolic panel     Status: Abnormal   Collection Time: 06/14/22  5:06 AM  Result Value Ref Range   Sodium 140 135 - 145 mmol/L   Potassium 3.7 3.5 - 5.1 mmol/L   Chloride 112 (H) 98 - 111 mmol/L   CO2 25 22 - 32 mmol/L   Glucose, Bld 91 70 - 99 mg/dL    Comment: Glucose reference range applies only to samples taken after fasting for at least 8 hours.   BUN 12 8 - 23 mg/dL   Creatinine, Ser 0.72 0.61 - 1.24 mg/dL   Calcium 7.9 (L) 8.9 - 10.3 mg/dL   GFR, Estimated >60 >60 mL/min    Comment: (NOTE) Calculated using the CKD-EPI Creatinine Equation (2021)    Anion gap 3 (L) 5 - 15    Comment: Performed at [facility], [address] [place], [place] [address]  ECHOCARDIOGRAM COMPLETE BUBBLE STUDY     Status: None   Collection Time: 06/14/22 10:18 AM  Result  Value Ref Range   S' Lateral 2.76 cm   Area-P 1/2 1.48 cm2  Hemoglobin and hematocrit, blood     Status: Abnormal   Collection Time: 06/14/22 11:32 AM  Result Value Ref Range   Hemoglobin 11.0 (L) 13.0 - 17.0 g/dL   HCT 33.6 (L) 39.0 - 52.0 %    Comment: Performed at [facility], [address] [place], [place] [address]  Protime-INR     Status: Abnormal   Collection Time: 06/14/22 11:32 AM  Result Value Ref Range   Prothrombin Time 15.5 (H) 11.4 - 15.2 seconds   INR 1.2 0.8 - 1.2    Comment: (NOTE) INR goal varies based on device and disease states. Performed at [facility], [address] [place], [place] [address]   Hemoglobin and hematocrit, blood     Status: Abnormal   Collection Time: 06/14/22  5:02 PM  Result Value Ref Range   Hemoglobin 10.2 (L) 13.0 - 17.0 g/dL   HCT 31.1 (L) 39.0 - 52.0 %    Comment: Performed at [facility], [place], [place] [address]  Heparin level (unfractionated)     Status: Abnormal   Collection Time: 06/14/22  8:05 PM  Result Value Ref Range   Heparin Unfractionated 0.16 (L) 0.30 - 0.70 IU/mL    Comment: (NOTE) The clinical reportable range upper limit is being lowered to >1.10 to align with the FDA approved guidance for the current laboratory assay.  If heparin results are below expected values, and patient dosage has  been confirmed, suggest follow up testing of antithrombin III levels. Performed at [facility], [address] [place],  [place] [address]   Hemoglobin and hematocrit, blood     Status: Abnormal   Collection Time: 06/14/22 11:07 PM  Result Value Ref Range   Hemoglobin 8.7 (L) 13.0 - 17.0 g/dL   HCT 26.2 (L) 39.0 - 52.0 %    Comment: Performed at [facility], [address] [place], [place] [address]  CBC     Status: Abnormal   Collection Time: 06/15/22  5:11 AM  Result Value Ref Range   WBC 3.5 (L) 4.0 - 10.5 K/uL   RBC 2.84 (L) 4.22 - 5.81 MIL/uL    Hemoglobin 9.0 (L) 13.0 - 17.0 g/dL   HCT 27.4 (L) 39.0 - 52.0 %   MCV 96.5 80.0 - 100.0 fL   MCH 31.7 26.0 - 34.0 pg   MCHC 32.8 30.0 - 36.0 g/dL   RDW 16.3 (H) 11.5 - 15.5 %   Platelets 127 (L) 150 - 400 K/uL   nRBC 0.0 0.0 - 0.2 %    Comment: Performed at [facility], [place], [place] [address]  Heparin level (unfractionated)     Status: None   Collection Time: 06/15/22  5:11 AM  Result Value Ref Range   Heparin Unfractionated 0.31 0.30 - 0.70 IU/mL    Comment: (NOTE) The clinical reportable range upper limit is being lowered to >1.10 to align with the FDA approved guidance for the current laboratory assay.  If heparin results are below expected values, and patient dosage has  been confirmed, suggest follow up testing of antithrombin III levels. Performed at [facility], [address] [place], [place] [address]   Hemoglobin and hematocrit, blood     Status: Abnormal   Collection Time: 06/15/22 11:08 AM  Result Value Ref Range   Hemoglobin 9.1 (L) 13.0 - 17.0 g/dL   HCT 28.2 (L) 39.0 - 52.0 %    Comment: Performed at [facility], [place], [place] [address]  Heparin level (unfractionated)     Status: None   Collection Time: 06/15/22 11:18 AM  Result Value Ref Range   Heparin Unfractionated 0.47 0.30 - 0.70 IU/mL    Comment: (NOTE) The clinical reportable range upper limit is being lowered to >1.10 to align with the FDA approved guidance for the current laboratory assay.  If heparin results are below expected values, and patient dosage has  been confirmed, suggest follow up testing of antithrombin III levels. Performed at [facility], [address] [place], [place] [address]   Hemoglobin and hematocrit, blood     Status: Abnormal   Collection Time: 06/15/22  5:23 PM  Result Value Ref Range   Hemoglobin 9.2 (L) 13.0 - 17.0 g/dL   HCT 28.3 (L) 39.0 - 52.0 %    Comment: Performed at  [facility], [address] [place], [place] [address]  Comprehensive metabolic panel     Status: Abnormal   Collection Time: 06/23/22  9:53 AM  Result Value Ref Range   Sodium 137 135 - 145 mmol/L   Potassium 3.7 3.5 - 5.1 mmol/L   Chloride 104 98 - 111 mmol/L   CO2 25 22 - 32 mmol/L   Glucose, Bld 227 (H) 70 - 99 mg/dL    Comment: Glucose reference range applies only to samples taken after fasting for at least 8 hours.   BUN 12 8 - 23 mg/dL   Creatinine, Ser 0.76 0.61 - 1.24 mg/dL   Calcium 8.1 (L) 8.9 - 10.3  mg/dL   Total Protein 6.4 (L) 6.5 - 8.1 g/dL   Albumin 2.6 (L) 3.5 - 5.0 g/dL   AST 34 15 - 41 U/L   ALT 17 0 - 44 U/L   Alkaline Phosphatase 119 38 - 126 U/L   Total Bilirubin 0.5 0.3 - 1.2 mg/dL   GFR, Estimated >60 >60 mL/min    Comment: (NOTE) Calculated using the CKD-EPI Creatinine Equation (2021)    Anion gap 8 5 - 15    Comment: Performed at [facility], [address] [place], [place] [address]  CBC with Differential     Status: Abnormal   Collection Time: 06/23/22  9:53 AM  Result Value Ref Range   WBC 2.7 (L) 4.0 - 10.5 K/uL   RBC 3.18 (L) 4.22 - 5.81 MIL/uL   Hemoglobin 10.5 (L) 13.0 - 17.0 g/dL   HCT 31.7 (L) 39.0 - 52.0 %   MCV 99.7 80.0 - 100.0 fL   MCH 33.0 26.0 - 34.0 pg   MCHC 33.1 30.0 - 36.0 g/dL   RDW 17.4 (H) 11.5 - 15.5 %   Platelets 180 150 - 400 K/uL   nRBC 0.0 0.0 - 0.2 %   Neutrophils Relative % 54 %   Neutro Abs 1.5 (L) 1.7 - 7.7 K/uL   Lymphocytes Relative 18 %   Lymphs Abs 0.5 (L) 0.7 - 4.0 K/uL   Monocytes Relative 19 %   Monocytes Absolute 0.5 0.1 - 1.0 K/uL   Eosinophils Relative 7 %   Eosinophils Absolute 0.2 0.0 - 0.5 K/uL   Basophils Relative 2 %   Basophils Absolute 0.0 0.0 - 0.1 K/uL   Immature Granulocytes 0 %   Abs Immature Granulocytes 0.01 0.00 - 0.07 K/uL    Comment: Performed at [facility], [address] [place], [place] [address]    Assessment/Plan:  Carotid stenosis The patient  has a high-grade left carotid artery stenosis with a recent stroke.  He is about a week and a half out so we could look at getting him on the schedule for potentially left carotid endarterectomy or left carotid stent placement next week.  I had a long discussion with he and his family today.  I discussed the differences between carotid endarterectomy and carotid stent placement.  I discussed the reason and rationale for having this fixed due to the reduced stroke risk.  In his case, with ongoing chemotherapy as well, we will need to time his repair on his off week from chemotherapy.  To reduce his recovery as well as minimize his healing with ongoing chemotherapy, carotid stenting would seem to be preferable in his case.  His anatomy appears to be acceptable for either carotid stenting or carotid endarterectomy.  He would for carotid stenting and that will also allow [place] not to hold his antiplatelet therapy after recent stroke which should be helpful.  He and his family voiced their understanding and desire to proceed.  Hypertension blood pressure control important in reducing the progression of atherosclerotic disease. On appropriate oral medications.   Stroke (cerebrum) ([place]) From his carotid stenosis  Diabetes mellitus type 2, uncomplicated (HCC) blood glucose control important in reducing the progression of atherosclerotic disease. Also, involved in wound healing. On appropriate medications.   Dyslipidemia blood glucose control important in reducing the progression of atherosclerotic disease. Also, involved in wound healing. On appropriate medications.      Oscar [name] 06/23/2022, 10:16 AM   This note was created with Dragon medical  transcription system.  Any errors from dictation are unintentional.

## 2022-06-23 NOTE — Progress Notes (Signed)
Had a stroke last week. Dr. Lucky Cowboy is placing a stent nest week.  Pt is concerned he will not be able to get treatment due to stroke and procedure next week.

## 2022-06-24 ENCOUNTER — Other Ambulatory Visit: Payer: Self-pay | Admitting: Internal Medicine

## 2022-06-25 ENCOUNTER — Other Ambulatory Visit: Payer: Self-pay | Admitting: Internal Medicine

## 2022-06-29 ENCOUNTER — Telehealth (INDEPENDENT_AMBULATORY_CARE_PROVIDER_SITE_OTHER): Payer: Self-pay

## 2022-06-29 NOTE — Telephone Encounter (Signed)
Spoke with the patient and he is scheduled with Dr. Lucky Cowboy for a left carotid stent placement on 07/01/22 with a 9:30 am arrival time to the MM. Pre-procedure instructions were discussed and patient stated he understood.

## 2022-06-30 ENCOUNTER — Ambulatory Visit
Admission: RE | Admit: 2022-06-30 | Discharge: 2022-06-30 | Disposition: A | Discharge status: Home / Self Care | Payer: Medicare HMO | Source: Ambulatory Visit | Attending: Internal Medicine | Admitting: Internal Medicine

## 2022-06-30 DIAGNOSIS — E785 Hyperlipidemia, unspecified: Secondary | ICD-10-CM | POA: Diagnosis not present

## 2022-06-30 DIAGNOSIS — R7303 Prediabetes: Secondary | ICD-10-CM | POA: Diagnosis not present

## 2022-06-30 DIAGNOSIS — C7800 Secondary malignant neoplasm of unspecified lung: Secondary | ICD-10-CM | POA: Diagnosis not present

## 2022-06-30 DIAGNOSIS — C182 Malignant neoplasm of ascending colon: Secondary | ICD-10-CM | POA: Insufficient documentation

## 2022-06-30 DIAGNOSIS — I119 Hypertensive heart disease without heart failure: Secondary | ICD-10-CM | POA: Diagnosis not present

## 2022-06-30 DIAGNOSIS — I69328 Other speech and language deficits following cerebral infarction: Secondary | ICD-10-CM | POA: Diagnosis not present

## 2022-06-30 DIAGNOSIS — N400 Enlarged prostate without lower urinary tract symptoms: Secondary | ICD-10-CM | POA: Diagnosis not present

## 2022-06-30 DIAGNOSIS — K769 Liver disease, unspecified: Secondary | ICD-10-CM | POA: Diagnosis not present

## 2022-06-30 DIAGNOSIS — N2889 Other specified disorders of kidney and ureter: Secondary | ICD-10-CM | POA: Diagnosis not present

## 2022-06-30 DIAGNOSIS — K838 Other specified diseases of biliary tract: Secondary | ICD-10-CM | POA: Diagnosis not present

## 2022-06-30 DIAGNOSIS — J984 Other disorders of lung: Secondary | ICD-10-CM | POA: Diagnosis not present

## 2022-06-30 DIAGNOSIS — I700 Atherosclerosis of aorta: Secondary | ICD-10-CM | POA: Diagnosis not present

## 2022-06-30 DIAGNOSIS — I69351 Hemiplegia and hemiparesis following cerebral infarction affecting right dominant side: Secondary | ICD-10-CM | POA: Diagnosis not present

## 2022-06-30 DIAGNOSIS — J90 Pleural effusion, not elsewhere classified: Secondary | ICD-10-CM | POA: Diagnosis not present

## 2022-06-30 DIAGNOSIS — C189 Malignant neoplasm of colon, unspecified: Secondary | ICD-10-CM | POA: Diagnosis not present

## 2022-06-30 MED ORDER — IOHEXOL 300 MG/ML  SOLN
100.0000 mL | Freq: Once | INTRAMUSCULAR | Status: AC | PRN
  Administered 2022-06-30: 100 mL via INTRAVENOUS

## 2022-07-01 ENCOUNTER — Telehealth: Payer: Self-pay | Admitting: Internal Medicine

## 2022-07-01 ENCOUNTER — Encounter
Admission: RE | Disposition: A | Discharge status: Home / Self Care | Payer: Self-pay | Source: Home / Self Care | Attending: Vascular Surgery

## 2022-07-01 ENCOUNTER — Inpatient Hospital Stay
Admission: RE | Admit: 2022-07-01 | Discharge: 2022-07-02 | DRG: 035 | Disposition: A | Discharge status: Home / Self Care | Payer: Medicare HMO | Attending: Vascular Surgery | Admitting: Vascular Surgery

## 2022-07-01 ENCOUNTER — Other Ambulatory Visit: Payer: Self-pay

## 2022-07-01 ENCOUNTER — Encounter: Payer: Self-pay | Admitting: Vascular Surgery

## 2022-07-01 DIAGNOSIS — I6522 Occlusion and stenosis of left carotid artery: Secondary | ICD-10-CM

## 2022-07-01 DIAGNOSIS — Q2545 Double aortic arch: Secondary | ICD-10-CM

## 2022-07-01 DIAGNOSIS — C189 Malignant neoplasm of colon, unspecified: Secondary | ICD-10-CM | POA: Diagnosis present

## 2022-07-01 DIAGNOSIS — I10 Essential (primary) hypertension: Secondary | ICD-10-CM | POA: Diagnosis present

## 2022-07-01 DIAGNOSIS — I69351 Hemiplegia and hemiparesis following cerebral infarction affecting right dominant side: Secondary | ICD-10-CM | POA: Diagnosis not present

## 2022-07-01 DIAGNOSIS — Z7902 Long term (current) use of antithrombotics/antiplatelets: Secondary | ICD-10-CM

## 2022-07-01 DIAGNOSIS — E119 Type 2 diabetes mellitus without complications: Secondary | ICD-10-CM | POA: Diagnosis present

## 2022-07-01 DIAGNOSIS — I63239 Cerebral infarction due to unspecified occlusion or stenosis of unspecified carotid arteries: Secondary | ICD-10-CM | POA: Diagnosis present

## 2022-07-01 DIAGNOSIS — Z7982 Long term (current) use of aspirin: Secondary | ICD-10-CM | POA: Diagnosis not present

## 2022-07-01 DIAGNOSIS — E785 Hyperlipidemia, unspecified: Secondary | ICD-10-CM | POA: Diagnosis present

## 2022-07-01 DIAGNOSIS — F17210 Nicotine dependence, cigarettes, uncomplicated: Secondary | ICD-10-CM | POA: Diagnosis present

## 2022-07-01 DIAGNOSIS — Z006 Encounter for examination for normal comparison and control in clinical research program: Secondary | ICD-10-CM | POA: Diagnosis not present

## 2022-07-01 DIAGNOSIS — Z7969 Long term (current) use of other immunomodulators and immunosuppressants: Secondary | ICD-10-CM | POA: Diagnosis not present

## 2022-07-01 DIAGNOSIS — Z01810 Encounter for preprocedural cardiovascular examination: Secondary | ICD-10-CM | POA: Diagnosis not present

## 2022-07-01 DIAGNOSIS — Z8673 Personal history of transient ischemic attack (TIA), and cerebral infarction without residual deficits: Secondary | ICD-10-CM | POA: Diagnosis not present

## 2022-07-01 DIAGNOSIS — C801 Malignant (primary) neoplasm, unspecified: Secondary | ICD-10-CM | POA: Diagnosis not present

## 2022-07-01 DIAGNOSIS — Z79899 Other long term (current) drug therapy: Secondary | ICD-10-CM | POA: Diagnosis not present

## 2022-07-01 HISTORY — PX: CAROTID PTA/STENT INTERVENTION: CATH118231

## 2022-07-01 LAB — CREATININE, SERUM
Creatinine, Ser: 0.81 mg/dL (ref 0.61–1.24)
GFR, Estimated: >60 mL/min (ref >60)

## 2022-07-01 LAB — MRSA NEXT GEN BY PCR, NASAL: MRSA by PCR Next Gen: NOT DETECTED

## 2022-07-01 LAB — BUN: BUN: 10 mg/dL (ref 8–23)

## 2022-07-01 LAB — GLUCOSE, CAPILLARY: Glucose-Capillary: 89 mg/dL (ref 70–99)

## 2022-07-01 SURGERY — CAROTID PTA/STENT INTERVENTION
Anesthesia: Moderate Sedation | Laterality: Left

## 2022-07-01 MED ORDER — CLOPIDOGREL BISULFATE 75 MG PO TABS
75.0000 mg | ORAL_TABLET | Freq: Every day | ORAL | Status: DC
  Administered 2022-07-01 – 2022-07-02 (×2): 75 mg via ORAL
  Filled 2022-07-01 (×2): qty 1

## 2022-07-01 MED ORDER — FENTANYL CITRATE (PF) 100 MCG/2ML IJ SOLN
INTRAMUSCULAR | Status: DC | PRN
  Administered 2022-07-01: 50 ug via INTRAVENOUS

## 2022-07-01 MED ORDER — MIDAZOLAM HCL 2 MG/ML PO SYRP: 8.0000 mg | ORAL_SOLUTION | Freq: Once | ORAL | Status: DC | PRN

## 2022-07-01 MED ORDER — ATROPINE SULFATE 1 MG/10ML IJ SOSY
PREFILLED_SYRINGE | INTRAMUSCULAR | Status: DC | PRN
  Administered 2022-07-01: 1 mg via INTRAVENOUS

## 2022-07-01 MED ORDER — PHENYLEPHRINE 80 MCG/ML (10ML) SYRINGE FOR IV PUSH (FOR BLOOD PRESSURE SUPPORT)
PREFILLED_SYRINGE | INTRAVENOUS | Status: AC
  Filled 2022-07-01: qty 10

## 2022-07-01 MED ORDER — CEFAZOLIN SODIUM-DEXTROSE 2-4 GM/100ML-% IV SOLN
2.0000 g | Freq: Three times a day (TID) | INTRAVENOUS | Status: AC
  Administered 2022-07-01 – 2022-07-02 (×2): 2 g via INTRAVENOUS
  Filled 2022-07-01 (×2): qty 100

## 2022-07-01 MED ORDER — DOPAMINE-DEXTROSE 3.2-5 MG/ML-% IV SOLN
INTRAVENOUS | Status: AC
  Filled 2022-07-01: qty 250

## 2022-07-01 MED ORDER — CEFAZOLIN SODIUM-DEXTROSE 2-4 GM/100ML-% IV SOLN
2.0000 g | INTRAVENOUS | Status: AC
  Administered 2022-07-01: 2 g via INTRAVENOUS

## 2022-07-01 MED ORDER — MIDAZOLAM HCL 2 MG/2ML IJ SOLN
INTRAMUSCULAR | Status: DC | PRN
  Administered 2022-07-01: 1 mg via INTRAVENOUS

## 2022-07-01 MED ORDER — GUAIFENESIN-DM 100-10 MG/5ML PO SYRP: 15.0000 mL | ORAL_SOLUTION | ORAL | Status: DC | PRN

## 2022-07-01 MED ORDER — SODIUM CHLORIDE 0.9 % IV SOLN
500.0000 mL | Freq: Once | INTRAVENOUS | Status: AC | PRN
  Administered 2022-07-02: 500 mL via INTRAVENOUS

## 2022-07-01 MED ORDER — FENTANYL CITRATE (PF) 100 MCG/2ML IJ SOLN
INTRAMUSCULAR | Status: AC
  Filled 2022-07-01: qty 2

## 2022-07-01 MED ORDER — HYDRALAZINE HCL 20 MG/ML IJ SOLN: 5.0000 mg | INTRAMUSCULAR | Status: DC | PRN

## 2022-07-01 MED ORDER — ATORVASTATIN CALCIUM 20 MG PO TABS
20.0000 mg | ORAL_TABLET | Freq: Every evening | ORAL | Status: DC
  Administered 2022-07-01: 20 mg via ORAL
  Filled 2022-07-01: qty 1

## 2022-07-01 MED ORDER — ATROPINE SULFATE 1 MG/10ML IJ SOSY
PREFILLED_SYRINGE | INTRAMUSCULAR | Status: AC
  Filled 2022-07-01: qty 10

## 2022-07-01 MED ORDER — FAMOTIDINE 20 MG PO TABS: 40.0000 mg | ORAL_TABLET | Freq: Once | ORAL | Status: DC | PRN

## 2022-07-01 MED ORDER — HYDROMORPHONE HCL 1 MG/ML IJ SOLN: 1.0000 mg | Freq: Once | INTRAMUSCULAR | Status: DC | PRN

## 2022-07-01 MED ORDER — FAMOTIDINE IN NACL 20-0.9 MG/50ML-% IV SOLN
20.0000 mg | Freq: Two times a day (BID) | INTRAVENOUS | Status: DC
  Administered 2022-07-01 – 2022-07-02 (×3): 20 mg via INTRAVENOUS
  Filled 2022-07-01 (×3): qty 50

## 2022-07-01 MED ORDER — MAGNESIUM SULFATE 2 GM/50ML IV SOLN: 2.0000 g | Freq: Every day | INTRAVENOUS | Status: DC | PRN

## 2022-07-01 MED ORDER — TAMSULOSIN HCL 0.4 MG PO CAPS
0.4000 mg | ORAL_CAPSULE | Freq: Every evening | ORAL | Status: DC
  Administered 2022-07-01: 0.4 mg via ORAL
  Filled 2022-07-01: qty 1

## 2022-07-01 MED ORDER — ASPIRIN 81 MG PO TBEC
81.0000 mg | DELAYED_RELEASE_TABLET | Freq: Every day | ORAL | Status: DC
  Administered 2022-07-01 – 2022-07-02 (×2): 81 mg via ORAL
  Filled 2022-07-01 (×2): qty 1

## 2022-07-01 MED ORDER — MORPHINE SULFATE (PF) 4 MG/ML IV SOLN: 2.0000 mg | INTRAVENOUS | Status: DC | PRN

## 2022-07-01 MED ORDER — PHENYLEPHRINE HCL-NACL 20-0.9 MG/250ML-% IV SOLN
INTRAVENOUS | Status: AC
  Filled 2022-07-01: qty 250

## 2022-07-01 MED ORDER — PHENOL 1.4 % MT LIQD: 1.0000 | OROMUCOSAL | Status: DC | PRN

## 2022-07-01 MED ORDER — ONDANSETRON HCL 4 MG/2ML IJ SOLN: 4.0000 mg | Freq: Four times a day (QID) | INTRAMUSCULAR | Status: DC | PRN

## 2022-07-01 MED ORDER — HEPARIN SODIUM (PORCINE) 1000 UNIT/ML IJ SOLN
INTRAMUSCULAR | Status: DC | PRN
  Administered 2022-07-01: 2000 [IU] via INTRAVENOUS
  Administered 2022-07-01: 7000 [IU] via INTRAVENOUS

## 2022-07-01 MED ORDER — METHYLPREDNISOLONE SODIUM SUCC 125 MG IJ SOLR: 125.0000 mg | Freq: Once | INTRAMUSCULAR | Status: DC | PRN

## 2022-07-01 MED ORDER — SODIUM CHLORIDE 0.9 % IV SOLN: INTRAVENOUS | Status: DC

## 2022-07-01 MED ORDER — OXYCODONE HCL 5 MG PO TABS
5.0000 mg | ORAL_TABLET | Freq: Three times a day (TID) | ORAL | Status: DC | PRN
  Administered 2022-07-01 – 2022-07-02 (×2): 5 mg via ORAL
  Filled 2022-07-01 (×2): qty 1

## 2022-07-01 MED ORDER — OXYCODONE-ACETAMINOPHEN 5-325 MG PO TABS: 1.0000 | ORAL_TABLET | ORAL | Status: DC | PRN

## 2022-07-01 MED ORDER — ALUM & MAG HYDROXIDE-SIMETH 200-200-20 MG/5ML PO SUSP: 15.0000 mL | ORAL | Status: DC | PRN

## 2022-07-01 MED ORDER — LABETALOL HCL 5 MG/ML IV SOLN: 10.0000 mg | INTRAVENOUS | Status: DC | PRN

## 2022-07-01 MED ORDER — MIDAZOLAM HCL 5 MG/5ML IJ SOLN
INTRAMUSCULAR | Status: AC
  Filled 2022-07-01: qty 5

## 2022-07-01 MED ORDER — METOPROLOL TARTRATE 5 MG/5ML IV SOLN: 2.0000 mg | INTRAVENOUS | Status: DC | PRN

## 2022-07-01 MED ORDER — ACETAMINOPHEN 325 MG PO TABS: 325.0000 mg | ORAL_TABLET | ORAL | Status: DC | PRN

## 2022-07-01 MED ORDER — POTASSIUM CHLORIDE CRYS ER 20 MEQ PO TBCR: 20.0000 meq | EXTENDED_RELEASE_TABLET | Freq: Every day | ORAL | Status: DC | PRN

## 2022-07-01 MED ORDER — DIPHENHYDRAMINE HCL 50 MG/ML IJ SOLN: 50.0000 mg | Freq: Once | INTRAMUSCULAR | Status: DC | PRN

## 2022-07-01 MED ORDER — ACETAMINOPHEN 325 MG RE SUPP: 325.0000 mg | RECTAL | Status: DC | PRN

## 2022-07-01 SURGICAL SUPPLY — 20 items
BALLN VTRAC 4.5X30X135 (BALLOONS) ×1
BALLOON VTRAC 4.5X30X135 (BALLOONS) IMPLANT
CATH ANGIO 5F PIGTAIL 100CM (CATHETERS) IMPLANT
CATH BEACON 5 .035 100 H1 TIP (CATHETERS) IMPLANT
COVER DRAPE FLUORO 36X44 (DRAPES) IMPLANT
COVER PROBE ULTRASOUND 5X96 (MISCELLANEOUS) IMPLANT
DEVICE EMBOSHIELD NAV6 2.5-4.8 (FILTER) IMPLANT
DEVICE STARCLOSE SE CLOSURE (Vascular Products) IMPLANT
DEVICE TORQUE (MISCELLANEOUS) IMPLANT
GLIDEWIRE ANGLED SS 035X260CM (WIRE) IMPLANT
GUIDEWIRE VASC STIFF .038X260 (WIRE) IMPLANT
KIT CAROTID MANIFOLD (MISCELLANEOUS) IMPLANT
KIT ENCORE 26 ADVANTAGE (KITS) IMPLANT
PACK ANGIOGRAPHY (CUSTOM PROCEDURE TRAY) ×1 IMPLANT
SHEATH BRITE TIP 6FRX11 (SHEATH) IMPLANT
SHEATH SHUTTLE 6FRX80 (SHEATH) IMPLANT
STENT XACT CAR 10-8X40X136 (Permanent Stent) IMPLANT
SYR MEDRAD MARK 7 150ML (SYRINGE) IMPLANT
TUBING CONTRAST HIGH PRESS 72 (TUBING) IMPLANT
WIRE GUIDERIGHT .035X150 (WIRE) IMPLANT

## 2022-07-01 NOTE — Telephone Encounter (Signed)
Message left for [facility] to return call for more detail of the medication and f/u is already scheduled with Dr. Jacinto Reap

## 2022-07-01 NOTE — Telephone Encounter (Signed)
Pt stepdaughter came to the [place] to let us know that the pt is currently admitted for surgery. She states that she wanted to go ahead and get his medication and schedule a possible follow up. She did not state which medication. How would you like to proceed?

## 2022-07-01 NOTE — Op Note (Signed)
OPERATIVE NOTE DATE: 07/01/2022  PROCEDURE:  Ultrasound guidance for vascular access right femoral artery  Placement of a 9 mm proximal, 7 mm distal, 4 cm long Exact stent with the use of the NAV-6 embolic protection device in the left carotid artery  PRE-OPERATIVE DIAGNOSIS: 1.  High-grade left carotid artery stenosis. 2.  Recent left hemispheric stroke  POST-OPERATIVE DIAGNOSIS:  Same as above  SURGEON: Leotis Pain, MD  ASSISTANT(S): None  ANESTHESIA: local/MCS  ESTIMATED BLOOD LOSS: 25 cc  CONTRAST: 80 cc  FLUORO TIME: 6 minutes  MODERATE CONSCIOUS SEDATION TIME:  Approximately 43 minutes using 1 mg of Versed and 50 mcg of Fentanyl  FINDING(S): 1.   95% ulcerated left carotid artery stenosis  SPECIMEN(S):   none  INDICATIONS:   Patient is a 81 y.o. male who presents with recent left hemispheric stroke and left carotid artery stenosis.  The patient has ongoing chemotherapy for treatment of malignancy and carotid artery stenting was felt to be preferred to endarterectomy for that reason.  Risks and benefits were discussed and informed consent was obtained.   DESCRIPTION: After obtaining full informed written consent, the patient was brought back to the vascular suite and placed supine upon the table.  The patient received IV antibiotics prior to induction. Moderate conscious sedation was administered during a face to face encounter with the patient throughout the procedure with my supervision of the RN administering medicines and monitoring the patients vital signs and mental status throughout from the start of the procedure until the patient was taken to the recovery room.  After obtaining adequate anesthesia, the patient was prepped and draped in the standard fashion.   The right femoral artery was visualized with ultrasound and found to be widely patent. It was then accessed under direct ultrasound guidance without difficulty with a Seldinger needle. A permanent image was  recorded. A J-wire was placed and we then placed a 6 French sheath. The patient was then heparinized and a total of 9000 units of intravenous heparin were given and an ACT was checked to confirm successful anticoagulation. A pigtail catheter was then placed into the ascending aorta. This showed a type IIB aortic arch with a separate origin of the left vertebral artery and no proximal stenosis of the great vessels. I then selectively cannulated the left common carotid artery without difficulty with a headhunter catheter and advanced into the mid left common carotid artery.  Cervical and cerebral carotid angiography was then performed. There was minimal flow only in the middle cerebral artery with no anterior cerebral filling. The carotid bifurcation demonstrated an ulcerated 95% stenosis just past the bifurcation.  I then advanced into the external carotid artery with a Glidewire and the headhunter catheter and then exchanged for the Amplatz Super Stiff wire. Over the Amplatz Super Stiff wire, a 6 [place] shuttle sheath was placed into the mid common carotid artery. I then used the NAV-6  Embolic protection device and crossed the lesion and parked this in the distal internal carotid artery at the base of the skull.  I then selected a 9 mm proximal 7 mm distal 4 cm long Exact stent. This was deployed across the lesion encompassing it in its entirety. A 4.5 mm diameter x 3 cm length balloon was used to post dilate the stent. Only about a 10-15% residual stenosis was present after angioplasty. Completion angiogram showed normal intracranial filling without new defects. At this point I elected to terminate the procedure. The sheath was removed and  StarClose closure device was deployed in the right femoral artery with excellent hemostatic result. The patient was taken to the recovery room in stable condition having tolerated the procedure well.  COMPLICATIONS: none  CONDITION: stable  Leotis Pain 07/01/2022 11:29  AM   This note was created with Dragon Medical transcription system. Any errors in dictation are purely unintentional.

## 2022-07-01 NOTE — Interval H&P Note (Signed)
History and Physical Interval Note:  07/01/2022 9:47 AM  Caro Hight  has presented today for surgery, with the diagnosis of L Carotid Stent    ABBOTT   Carotid artery stenosis.  The various methods of treatment have been discussed with the patient and family. After consideration of risks, benefits and other options for treatment, the patient has consented to  Procedure(s): CAROTID PTA/STENT INTERVENTION (Left) as a surgical intervention.  The patient's history has been reviewed, patient examined, no change in status, stable for surgery.  I have reviewed the patient's chart and labs.  Questions were answered to the patient's satisfaction.     Leotis Pain

## 2022-07-02 ENCOUNTER — Encounter: Payer: Self-pay | Admitting: Internal Medicine

## 2022-07-02 ENCOUNTER — Telehealth: Payer: Self-pay | Admitting: *Deleted

## 2022-07-02 ENCOUNTER — Telehealth: Payer: Self-pay | Admitting: Internal Medicine

## 2022-07-02 ENCOUNTER — Other Ambulatory Visit: Payer: Self-pay

## 2022-07-02 ENCOUNTER — Encounter: Payer: Self-pay | Admitting: Vascular Surgery

## 2022-07-02 ENCOUNTER — Other Ambulatory Visit: Payer: Self-pay | Admitting: Internal Medicine

## 2022-07-02 DIAGNOSIS — I6522 Occlusion and stenosis of left carotid artery: Secondary | ICD-10-CM | POA: Diagnosis not present

## 2022-07-02 LAB — BASIC METABOLIC PANEL
Anion gap: 5 (ref 5–15)
BUN: 11 mg/dL (ref 8–23)
CO2: 24 mmol/L (ref 22–32)
Calcium: 7.6 mg/dL — ABNORMAL LOW (ref 8.9–10.3)
Chloride: 110 mmol/L (ref 98–111)
Creatinine, Ser: 0.76 mg/dL (ref 0.61–1.24)
GFR, Estimated: >60 mL/min (ref >60)
Glucose, Bld: 102 mg/dL — ABNORMAL HIGH (ref 70–99)
Potassium: 3.8 mmol/L (ref 3.5–5.1)
Sodium: 139 mmol/L (ref 135–145)

## 2022-07-02 LAB — CBC
HCT: 26.6 % — ABNORMAL LOW (ref 39.0–52.0)
Hemoglobin: 8.5 g/dL — ABNORMAL LOW (ref 13.0–17.0)
MCH: 32.1 pg (ref 26.0–34.0)
MCHC: 32 g/dL (ref 30.0–36.0)
MCV: 100.4 fL — ABNORMAL HIGH (ref 80.0–100.0)
Platelets: 135 10*3/uL — ABNORMAL LOW (ref 150–400)
RBC: 2.65 MIL/uL — ABNORMAL LOW (ref 4.22–5.81)
RDW: 16.9 % — ABNORMAL HIGH (ref 11.5–15.5)
WBC: 4.4 10*3/uL (ref 4.0–10.5)
nRBC: 0 % (ref 0.0–0.2)

## 2022-07-02 LAB — POCT ACTIVATED CLOTTING TIME: Activated Clotting Time: 263 seconds

## 2022-07-02 MED ORDER — OXYCODONE HCL 5 MG PO TABS: 5.0000 mg | ORAL_TABLET | Freq: Three times a day (TID) | ORAL | 0 refills | Status: DC | PRN

## 2022-07-02 MED ORDER — CHLORHEXIDINE GLUCONATE CLOTH 2 % EX PADS
6.0000 | MEDICATED_PAD | Freq: Every day | CUTANEOUS | Status: DC
  Administered 2022-07-02: 6 via TOPICAL

## 2022-07-02 MED ORDER — HEPARIN SOD (PORK) LOCK FLUSH 100 UNIT/ML IV SOLN
500.0000 [IU] | Freq: Once | INTRAVENOUS | Status: DC
  Filled 2022-07-02: qty 5

## 2022-07-02 NOTE — Telephone Encounter (Signed)
Got a call from pt and was requesting that he needs refill of oxycodone. He currently in [place]. And has a discharge already put in and the pt is waiting for his family to take him home. I asked how many pills does he take a day. He says usually 2 a day. He has 3-4 pills left. I told him I will send message to see what Dr. B wants to do.

## 2022-07-02 NOTE — Discharge Summary (Signed)
[facility]    Discharge Summary    Patient ID:  Oscar [name] MRN: [id number] DOB/AGE: 11/27/1940 81 y.o.  Admit date: 07/01/2022 Discharge date: 07/02/2022 Date of Surgery: 07/01/2022 Surgeon: Surgeon(s): Algernon Huxley, MD  Admission Diagnosis: Carotid stenosis, symptomatic, with infarction [facility]) [I63.239]  Discharge Diagnoses:  Carotid stenosis, symptomatic, with infarction [facility]) [I63.239]  Secondary Diagnoses: Past Medical History:  Diagnosis Date   Arthritis    Cancer ([place])    colon cancer 02/2019 per pt    Family history of adverse reaction to anesthesia    cousin took all night to wake up from anesthesia   H/O colon cancer, stage IV    Hyperlipemia    Neuromuscular disorder ([place])    Pre-diabetes     Procedure(s): CAROTID PTA/STENT INTERVENTION  Discharged Condition: good  HPI:  Oscar [name] is a 81 year old male who presented to [facility] for treatment of a left carotid artery stenosis.  The patient was noted to have about a 95% ICA lesion.  The patient is recovering from her stroke several weeks ago.  The patient underwent procedure yesterday without any difficulty or issues.  Today he has been up, ambulatory and is eating solid food without difficulty.  He denies any significant overnight pain or issues.  Hospital Course:  Oscar [name] is a 81 y.o. male is S/P Left carotid stent placement Procedure(s): CAROTID PTA/STENT INTERVENTION Extubated: POD # 0 Physical exam: Groins soft with no evidence of hematoma.  No neurological deficits Post-op wounds clean, dry, intact or healing well Pt. Ambulating, voiding and taking PO diet without difficulty. Pt pain controlled with PO pain meds. Labs as below Complications:none  Consults:    Significant Diagnostic Studies: CBC Lab Results  Component Value Date   WBC 4.4 07/02/2022   HGB 8.5 (L) 07/02/2022   HCT 26.6 (L) 07/02/2022   MCV 100.4 (H)  07/02/2022   PLT 135 (L) 07/02/2022    BMET    Component Value Date/Time   NA 139 07/02/2022 0445   NA 137 09/18/2013 0424   K 3.8 07/02/2022 0445   K 4.5 09/18/2013 0424   CL 110 07/02/2022 0445   CL 107 09/18/2013 0424   CO2 24 07/02/2022 0445   CO2 28 09/18/2013 0424   GLUCOSE 102 (H) 07/02/2022 0445   GLUCOSE 196 (H) 09/18/2013 0424   BUN 11 07/02/2022 0445   BUN 19 (H) 09/18/2013 0424   CREATININE 0.76 07/02/2022 0445   CREATININE 1.15 09/18/2013 0424   CALCIUM 7.6 (L) 07/02/2022 0445   CALCIUM 9.1 09/18/2013 0424   GFRNONAA >60 07/02/2022 0445   GFRNONAA >60 09/18/2013 0424   GFRAA >60 07/22/2020 0836   GFRAA >60 09/18/2013 0424   COAG Lab Results  Component Value Date   INR 1.2 06/14/2022   INR 1.2 06/12/2022   INR 1.2 04/04/2021     Disposition:  Discharge to :Home  Allergies as of 07/02/2022       Reactions   Ace Inhibitors Swelling        Medication List     TAKE these medications    aspirin EC 81 MG tablet Take 1 tablet (81 mg total) by mouth daily. Swallow whole.   atorvastatin 20 MG tablet Commonly known as: LIPITOR Take 1 tablet (20 mg total) by mouth daily.   clopidogrel 75 MG tablet Commonly known as: Plavix Take 1 tablet (75 mg total) by mouth daily.   oxyCODONE 5 MG immediate  release tablet Commonly known as: Oxy IR/ROXICODONE Take 1 tablet (5 mg total) by mouth every 8 (eight) hours as needed for severe pain.   tamsulosin 0.4 MG Caps capsule Commonly known as: FLOMAX Take 1 capsule (0.4 mg total) by mouth daily.       Verbal and written Discharge instructions given to the patient. Wound care per Discharge AVS   Signed: Kris Hartmann, NP  07/02/2022, 11:19 AM

## 2022-07-02 NOTE — Progress Notes (Signed)
Patient discharged home via [place]. Vitals WNL at time of discharge. All patient belongings packed in belonging bags and sent with patient. Volunteer escorted patient to medical mall in wheelchair.

## 2022-07-02 NOTE — Progress Notes (Signed)
PAD removed by this RN without complications at [id number] per MD Dew secure chat saying to remove PAD at this time. Site covered with gauze and tape. No bleeding and site is soft. Patient resting comfortably at this time. No complaints of pain. Site looks unremarkable.

## 2022-07-02 NOTE — Progress Notes (Signed)
NP Arna Medici at bedside with RN present. NP requested patient ambulate after breakfast. If no complications, patient will hopefully be discharged home mid day. Patient verbalized to this RN he has a person who will come drive him home.

## 2022-07-02 NOTE — TOC Initial Note (Signed)
Transition of Care [facility]) - Initial/Assessment Note    Patient Details  Name: Oscar [name] MRN: [id number] Date of Birth: 1941/05/22  Transition of Care [facility]) CM/SW Contact:    Shelbie Hutching, RN Phone Number: 07/02/2022, 11:52 AM  Clinical Narrative:                  Transition of Care [facility]) Screening Note   Patient Details  Name: Oscar [name] Date of Birth: 05-26-1941   [facility] of Care [facility]) CM/SW Contact:    Shelbie Hutching, RN Phone Number: 07/02/2022, 11:52 AM    Transition of Care Department [facility]) has reviewed patient and no TOC needs have been identified at this time. We will continue to monitor patient advancement through interdisciplinary progression rounds. If new patient transition needs arise, please place a TOC consult.          Patient Goals and CMS Choice        Expected Discharge Plan and Services           Expected Discharge Date: 07/02/22                                    Prior Living Arrangements/Services                       Activities of Daily Living Home Assistive Devices/Equipment: Gilford Rile (specify type) ADL Screening (condition at time of admission) Patient's cognitive ability adequate to safely complete daily activities?: Yes Is the patient deaf or have difficulty hearing?: No Does the patient have difficulty seeing, even when wearing glasses/contacts?: No Does the patient have difficulty concentrating, remembering, or making decisions?: No Patient able to express need for assistance with ADLs?: Yes Does the patient have difficulty dressing or bathing?: No Independently performs ADLs?: Yes (appropriate for developmental age) Does the patient have difficulty walking or climbing stairs?: No Weakness of Legs: None Weakness of Arms/Hands: None  Permission Sought/Granted                  Emotional Assessment              Admission diagnosis:  Carotid stenosis, symptomatic, with infarction  [facility]) [I63.239] Patient Active Problem List   Diagnosis Date Noted   Carotid stenosis, symptomatic, with infarction ([place]) 07/01/2022   Carotid stenosis 06/23/2022   Stroke (cerebrum) ([place]) 06/14/2022   Essential hypertension 06/13/2022   Dyslipidemia 06/13/2022   GERD (gastroesophageal reflux disease) 06/13/2022   Acute cerebral infarction ([place]) 06/12/2022   Genetic testing 05/19/2022   Duodenal ulcer 12/04/2020   Goals of care, counseling/discussion 07/13/2019   Cancer of right colon ([place]) 07/05/2019   Colon cancer metastasized to liver ([place]) 06/07/2019   Mass of multiple sites of liver 03/09/2019   Other specified diseases of intestine 03/09/2019   Primary osteoarthritis of both knees 03/09/2019   Tobacco abuse 03/09/2019   Acute phlegmonous appendicitis 03/04/2019   BPH (benign prostatic hyperplasia) 08/06/2014   Diabetes mellitus type 2, uncomplicated ([place]) 40/98/1191   Hyperlipidemia 08/06/2014   Hypertension 08/06/2014   Seasonal allergic rhinitis 08/06/2014   PCP:  Sofie Hartigan, MD Pharmacy:   [id number] [place] [id number]: [phone]: [phone]    Social Determinants of Health (SDOH) Interventions Food Insecurity Interventions: Intervention Not Indicated  Readmission Risk Interventions     No  data to display           

## 2022-07-02 NOTE — Telephone Encounter (Signed)
Spoke to patient's daughter Haynes Dage Tucker-updated regarding patient disease the recent scans.  I reached out to [facility] Dr. Dennison Nancy for any clinical trial options.  Patient's recent stroke/borderline performance status precludes any clinical trial participation at this time.   I sent a prescription for pain medication; as requested by the daughter.  Patent is due.  Recommend follow-up as planned- Dr.B

## 2022-07-03 NOTE — Telephone Encounter (Signed)
See other phone note from Dr. Jacinto Reap, he spoke with daughter.

## 2022-07-05 ENCOUNTER — Encounter: Payer: Self-pay | Admitting: Internal Medicine

## 2022-07-06 ENCOUNTER — Inpatient Hospital Stay: Payer: Medicare HMO

## 2022-07-06 ENCOUNTER — Other Ambulatory Visit: Payer: Self-pay | Admitting: *Deleted

## 2022-07-06 ENCOUNTER — Telehealth: Payer: Self-pay | Admitting: *Deleted

## 2022-07-06 ENCOUNTER — Inpatient Hospital Stay: Payer: Medicare HMO | Admitting: Hospice and Palliative Medicine

## 2022-07-06 ENCOUNTER — Ambulatory Visit
Admission: RE | Admit: 2022-07-06 | Discharge: 2022-07-06 | Disposition: A | Discharge status: Home / Self Care | Payer: Medicare HMO | Source: Ambulatory Visit | Attending: Hospice and Palliative Medicine | Admitting: Hospice and Palliative Medicine

## 2022-07-06 DIAGNOSIS — C787 Secondary malignant neoplasm of liver and intrahepatic bile duct: Secondary | ICD-10-CM | POA: Diagnosis not present

## 2022-07-06 DIAGNOSIS — C189 Malignant neoplasm of colon, unspecified: Secondary | ICD-10-CM | POA: Diagnosis not present

## 2022-07-06 DIAGNOSIS — C182 Malignant neoplasm of ascending colon: Secondary | ICD-10-CM | POA: Diagnosis not present

## 2022-07-06 DIAGNOSIS — R180 Malignant ascites: Secondary | ICD-10-CM

## 2022-07-06 DIAGNOSIS — R188 Other ascites: Secondary | ICD-10-CM | POA: Diagnosis not present

## 2022-07-06 NOTE — Telephone Encounter (Signed)
See phone note- pt called

## 2022-07-06 NOTE — Procedures (Signed)
PROCEDURE SUMMARY:  Successful US guided paracentesis from left abdomen.  Yielded 2 L of clear yellow fluid.  No immediate complications.  Pt tolerated well.   Specimen not sent for labs.  EBL < 2 mL  Theresa Duty, NP 07/06/2022 3:43 PM

## 2022-07-06 NOTE — Telephone Encounter (Signed)
Spoke with stepdaughter, Velta Addison. Pt reports abdominal bloating/ fullness. Feels like he is 6-8 months pregnant in abdominal girth. Discussed concerns with Merrily Pew, NP.  Recommends u/s para given pt's symptoms and recent abdominal ascites that was seen on last ct scan. Apt given for pt to see Merrily Pew, NP and labs tomorrow. U/s para scheduled at 2 pm. Daughter instructed to have patient in medical mall by 130 pm today.

## 2022-07-07 ENCOUNTER — Inpatient Hospital Stay: Payer: Medicare HMO

## 2022-07-07 ENCOUNTER — Other Ambulatory Visit: Payer: Self-pay | Admitting: *Deleted

## 2022-07-07 ENCOUNTER — Encounter: Payer: Self-pay | Admitting: Hospice and Palliative Medicine

## 2022-07-07 ENCOUNTER — Other Ambulatory Visit: Payer: Self-pay

## 2022-07-07 ENCOUNTER — Ambulatory Visit: Payer: Medicare HMO

## 2022-07-07 ENCOUNTER — Inpatient Hospital Stay (HOSPITAL_BASED_OUTPATIENT_CLINIC_OR_DEPARTMENT_OTHER): Payer: Medicare HMO | Admitting: Hospice and Palliative Medicine

## 2022-07-07 ENCOUNTER — Other Ambulatory Visit: Payer: Medicare HMO

## 2022-07-07 ENCOUNTER — Telehealth: Payer: Self-pay | Admitting: *Deleted

## 2022-07-07 ENCOUNTER — Ambulatory Visit: Payer: Medicare HMO | Admitting: Internal Medicine

## 2022-07-07 VITALS — BP 126/68 | HR 64 | Temp 97.6°F | Resp 20

## 2022-07-07 DIAGNOSIS — C7800 Secondary malignant neoplasm of unspecified lung: Secondary | ICD-10-CM | POA: Diagnosis not present

## 2022-07-07 DIAGNOSIS — C182 Malignant neoplasm of ascending colon: Secondary | ICD-10-CM

## 2022-07-07 DIAGNOSIS — C787 Secondary malignant neoplasm of liver and intrahepatic bile duct: Secondary | ICD-10-CM | POA: Diagnosis not present

## 2022-07-07 DIAGNOSIS — R180 Malignant ascites: Secondary | ICD-10-CM

## 2022-07-07 DIAGNOSIS — Z95828 Presence of other vascular implants and grafts: Secondary | ICD-10-CM

## 2022-07-07 DIAGNOSIS — K3189 Other diseases of stomach and duodenum: Secondary | ICD-10-CM | POA: Diagnosis not present

## 2022-07-07 DIAGNOSIS — R634 Abnormal weight loss: Secondary | ICD-10-CM | POA: Diagnosis not present

## 2022-07-07 DIAGNOSIS — R112 Nausea with vomiting, unspecified: Secondary | ICD-10-CM | POA: Diagnosis not present

## 2022-07-07 DIAGNOSIS — G893 Neoplasm related pain (acute) (chronic): Secondary | ICD-10-CM | POA: Diagnosis not present

## 2022-07-07 LAB — CBC WITH DIFFERENTIAL/PLATELET
Abs Immature Granulocytes: 0.02 10*3/uL (ref 0.00–0.07)
Basophils Absolute: 0 10*3/uL (ref 0.0–0.1)
Basophils Relative: 1 %
Eosinophils Absolute: 0.3 10*3/uL (ref 0.0–0.5)
Eosinophils Relative: 5 %
HCT: 32.7 % — ABNORMAL LOW (ref 39.0–52.0)
Hemoglobin: 10.7 g/dL — ABNORMAL LOW (ref 13.0–17.0)
Immature Granulocytes: 0 %
Lymphocytes Relative: 13 %
Lymphs Abs: 0.7 10*3/uL (ref 0.7–4.0)
MCH: 32.7 pg (ref 26.0–34.0)
MCHC: 32.7 g/dL (ref 30.0–36.0)
MCV: 100 fL (ref 80.0–100.0)
Monocytes Absolute: 0.5 10*3/uL (ref 0.1–1.0)
Monocytes Relative: 9 %
Neutro Abs: 4.1 10*3/uL (ref 1.7–7.7)
Neutrophils Relative %: 72 %
Platelets: 208 10*3/uL (ref 150–400)
RBC: 3.27 MIL/uL — ABNORMAL LOW (ref 4.22–5.81)
RDW: 16.9 % — ABNORMAL HIGH (ref 11.5–15.5)
WBC: 5.6 10*3/uL (ref 4.0–10.5)
nRBC: 0 % (ref 0.0–0.2)

## 2022-07-07 LAB — COMPREHENSIVE METABOLIC PANEL
ALT: 25 U/L (ref 0–44)
AST: 53 U/L — ABNORMAL HIGH (ref 15–41)
Albumin: 2.5 g/dL — ABNORMAL LOW (ref 3.5–5.0)
Alkaline Phosphatase: 180 U/L — ABNORMAL HIGH (ref 38–126)
Anion gap: 4 — ABNORMAL LOW (ref 5–15)
BUN: 11 mg/dL (ref 8–23)
CO2: 26 mmol/L (ref 22–32)
Calcium: 8.1 mg/dL — ABNORMAL LOW (ref 8.9–10.3)
Chloride: 108 mmol/L (ref 98–111)
Creatinine, Ser: 0.69 mg/dL (ref 0.61–1.24)
GFR, Estimated: >60 mL/min (ref >60)
Glucose, Bld: 164 mg/dL — ABNORMAL HIGH (ref 70–99)
Potassium: 3.8 mmol/L (ref 3.5–5.1)
Sodium: 138 mmol/L (ref 135–145)
Total Bilirubin: 1 mg/dL (ref 0.3–1.2)
Total Protein: 6.9 g/dL (ref 6.5–8.1)

## 2022-07-07 MED ORDER — SODIUM CHLORIDE 0.9% FLUSH
10.0000 mL | Freq: Once | INTRAVENOUS | Status: AC
  Administered 2022-07-07: 10 mL via INTRAVENOUS
  Filled 2022-07-07: qty 10

## 2022-07-07 MED ORDER — HEPARIN SOD (PORK) LOCK FLUSH 100 UNIT/ML IV SOLN
500.0000 [IU] | Freq: Once | INTRAVENOUS | Status: AC
  Administered 2022-07-07: 500 [IU]
  Filled 2022-07-07: qty 5

## 2022-07-07 NOTE — Telephone Encounter (Signed)
Call from [facility] Physical Therapist asking for continuation of therapy orders 3 wk 1, 2 wk 3. Please advise

## 2022-07-07 NOTE — Progress Notes (Signed)
Sympt[facility] Management [facility] at [facility] [phone] [phone]  Patient Care Team: Sofie Hartigan, MD as PCP - General (Family Medicine) Cammie Sickle, MD as Consulting Physician (Hematology and Oncology)   Name of the patient: Oscar [name]  [id number]  [birth year]   Date of visit: 07/07/22  Reason for Consult:  Oscar [name] is a 81 y.o. male with multiple medical problems including stage IV colon cancer s/p FOLFIRI plus Avastin, oral TAS-102 plus bevacizumab.  Patient underwent embolization to liver lesions on 01/15/2021.  He is status post Y 90 to liver lesions on 04/04/2021.  CT scan December 2022 revealed disease progression with increasing lung nodule/left moderate pleural effusion and new liver lesion/soft tissue implant right kidney.  Patient was hospitalized 06/12/2022 to 06/07/2022 with acute left MCA CVA and was found to have left ICA stenosis.  Patient underwent stenting on 07/01/2022.  CT chest, abdomen, and pelvis on 06/30/2022 showed progressive pulmonary metastatic disease, stable hepatic masses, interval increase in size of right adrenal gland and right renal mass, progressive ascites, peritoneal carcinomatosis, moderate left sided pleural effusion.  Patient presents to [facility] for evaluation of abdominal distention.  He was sent yesterday on 07/06/2022 for paracentesis with 2 L ascites removed.  Today, patient reports that he feels some improved, although he still has some abdominal distention.  Denies fever or chills.  No nausea or vomiting, diarrhea or constipation.  No severe abdominal pain. Denies urinary complaints. Patient offers no further specific complaints today.  PAST MEDICAL HISTORY: Past Medical History:  Diagnosis Date   Arthritis    Cancer ([place])    colon cancer 02/2019 per pt    Family history of adverse reaction to anesthesia    cousin took all night to wake up from anesthesia   H/O colon  cancer, stage IV    Hyperlipemia    Neuromuscular disorder ([place])    Pre-diabetes     PAST SURGICAL HISTORY:  Past Surgical History:  Procedure Laterality Date   CAROTID PTA/STENT INTERVENTION Left 07/01/2022   Procedure: CAROTID PTA/STENT INTERVENTION;  Surgeon: Algernon Huxley, MD;  Location: [place] [facility];  Service: Cardiovascular;  Laterality: Left;   COLON SURGERY     ESOPHAGOGASTRODUODENOSCOPY (EGD) WITH PROPOFOL N/A 12/26/2020   Procedure: ESOPHAGOGASTRODUODENOSCOPY (EGD) WITH PROPOFOL;  Surgeon: [name], MD;  Location: [name];  Service: Gastroenterology;  Laterality: N/A;   IR ANGIOGRAM SELECTIVE EACH ADDITIONAL VESSEL  12/19/2020   IR ANGIOGRAM SELECTIVE EACH ADDITIONAL VESSEL  03/24/2021   IR ANGIOGRAM SELECTIVE EACH ADDITIONAL VESSEL  03/24/2021   IR ANGIOGRAM SELECTIVE EACH ADDITIONAL VESSEL  04/04/2021   IR ANGIOGRAM SELECTIVE EACH ADDITIONAL VESSEL  04/04/2021   IR ANGIOGRAM SELECTIVE EACH ADDITIONAL VESSEL  04/04/2021   IR ANGIOGRAM VISCERAL SELECTIVE  12/19/2020   IR ANGIOGRAM VISCERAL SELECTIVE  03/24/2021   IR ANGIOGRAM VISCERAL SELECTIVE  04/04/2021   IR ANGIOGRAM VISCERAL SELECTIVE  04/04/2021   IR EMBO ARTERIAL NOT HEMORR HEMANG INC GUIDE ROADMAPPING  03/24/2021   IR EMBO TUMOR ORGAN ISCHEMIA INFARCT INC GUIDE ROADMAPPING  12/19/2020   IR EMBO TUMOR ORGAN ISCHEMIA INFARCT INC GUIDE ROADMAPPING  04/04/2021   IR IMAGING GUIDED PORT INSERTION  07/20/2019   IR RADIOLOGIST EVAL & MGMT  12/05/2020   IR RADIOLOGIST EVAL & MGMT  02/11/2021   IR RADIOLOGIST EVAL & MGMT  03/04/2021   IR RADIOLOGIST EVAL & MGMT  04/29/2021   IR RADIOLOGIST EVAL & MGMT  07/22/2021  IR RADIOLOGIST EVAL & MGMT  11/19/2021   IR US GUIDE VASC ACCESS RIGHT  12/19/2020   IR US GUIDE VASC ACCESS RIGHT  03/24/2021   IR US GUIDE VASC ACCESS RIGHT  04/04/2021   JOINT REPLACEMENT Left 2010   Knee   RADIOLOGY WITH ANESTHESIA N/A 01/15/2021   Procedure: CT WITH ANESTHESIA MICROWAVE ABLATION OF LIVER;  Surgeon:  Criselda Peaches, MD;  Location: [facility];  Service: Anesthesiology;  Laterality: N/A;    HEMATOLOGY/ONCOLOGY HISTORY:  Oncology History Overview Note  # MAY 2020- 3. 03/09/19 Liver biopsy. Microscopic examination shows malignant cells with glandular architecture consistent with adenocarcinoma. The malignant cells are positive for CK20 and CDX-2. These findings support the clinical impression of metastatic colon adenocarcinoma. 4. 03/10/19 R hemicolectomy. Tumor site cecum. Adenocarcinoma. Mucinous features present. G2. No tumor deposits. Invades visceral peritoneum. No tumor perforation. LVI present. PNI not identified. All margins uninvolved. 1/12 LNs. PT4apN1. Periappendiceal inflammation c/w resolving abscess. Microsatellite stable (MSS). [Dr.Mettu; [facility]]  # SEP 4th 2020 [compared to May 2020]  Interval increase in size of the metastases to the hepatic dome, The metastasis to the left hepatic lobe is unchanged; 2.  New subcentimeter hypoattenuating lesion in the inferior right hepatic lobe, incompletely characterized on CT. 3.  Postsurgical changes following right hemicolectomy.  # OCT 2020- FOLFOX +avastin; CT dec 22nd 2020- [compared to [facility] sep 9th 2020]-Liver- slight progression versus stable disease; CT scan SEP 4th 2021- Progressive disease-left lower lobe lung nodule 8 mm [previously 4 mm]; increase in size of the hepatic metastases by few millimeters.;  Soft tissue nodule adjacent to anastomotic site again increased by few millimeters.STOP FOLFOX; cont avastin  #  SEP 20th, 2021- FOLFIRI+ AVASTIN; HOLD FEB 2022- sec to poor tolerance [peptic ulcer]; 3/30-liver ablation-   [bland embolization of segment 2/3 liver lesion on 12/19/2020;  microwave ablation of both liver lesions.  # ?July 25th, 2022- BEV _ TAS 102.MAY 2022- s/p Liver ablation;Tas 102- stopped in Nov-Dec 2022-   # DEC 2022-progressive disease-lung and liver/abdomen; JAN 31st 2023-Stivarga 2 pills a day-3 weeks on 1 week off.   Hold off any dose escalation given ongoing fatigue/poor tolerance in general. MARCH 2023- discontinued Progression. II opinion at [facility]-   # MAY 2023- FOLFIRI+ Bev- s/p 9 cycles- Progression of disease/liver+lung.   # FEB /16-EGD [Dr.Anna-duodenal ulcer-? meloxicam]  # JAN 2022- COVID [s/p Mab infusion; pills; skin rash resolved.]  # NGS/F-ONE-MUTATED K-RAS [G]       Cancer of right colon ([place])  07/05/2019 Initial Diagnosis   Cancer of right colon ([place])   07/24/2019 - 06/25/2020 Chemotherapy   The patient had dexamethasone (DECADRON) 4 MG tablet, 8 mg, Oral, Daily, 1 of 1 cycle, Start date: --, End date: -- palonosetron (ALOXI) injection 0.25 mg, 0.25 mg, Intravenous,  Once, 23 of 25 cycles Administration: 0.25 mg (07/24/2019), 0.25 mg (08/07/2019), 0.25 mg (08/21/2019), 0.25 mg (09/04/2019), 0.25 mg (09/20/2019), 0.25 mg (10/04/2019), 0.25 mg (10/16/2019), 0.25 mg (10/30/2019), 0.25 mg (11/22/2019), 0.25 mg (12/06/2019), 0.25 mg (12/20/2019), 0.25 mg (01/03/2020), 0.25 mg (01/29/2020), 0.25 mg (02/12/2020), 0.25 mg (02/26/2020), 0.25 mg (03/11/2020), 0.25 mg (03/25/2020), 0.25 mg (04/08/2020), 0.25 mg (04/24/2020), 0.25 mg (05/08/2020), 0.25 mg (05/22/2020), 0.25 mg (06/10/2020), 0.25 mg (06/25/2020) leucovorin 800 mg in dextrose 5 % 250 mL infusion, 844 mg, Intravenous,  Once, 23 of 25 cycles Administration: 800 mg (07/24/2019), 800 mg (08/07/2019), 800 mg (08/21/2019), 800 mg (09/04/2019), 800 mg (09/20/2019), 800 mg (10/04/2019), 800 mg (10/16/2019), 800 mg (10/30/2019),  800 mg (11/22/2019), 800 mg (12/06/2019), 800 mg (12/20/2019), 800 mg (01/03/2020), 800 mg (01/29/2020), 800 mg (02/12/2020), 800 mg (02/26/2020), 800 mg (03/11/2020), 800 mg (03/25/2020), 800 mg (04/08/2020), 800 mg (04/24/2020), 800 mg (05/08/2020), 800 mg (05/22/2020), 800 mg (06/10/2020), 800 mg (06/25/2020) oxaliplatin (ELOXATIN) 180 mg in dextrose 5 % 500 mL chemo infusion, 85 mg/m2 = 180 mg, Intravenous,  Once, 23 of 25 cycles Dose modification: 178 mg (original dose  85 mg/m2, Cycle 17, Reason: Other (see comments), Comment: insurance adjusted dose ) Administration: 180 mg (07/24/2019), 180 mg (08/07/2019), 180 mg (08/21/2019), 180 mg (09/04/2019), 180 mg (09/20/2019), 180 mg (10/04/2019), 180 mg (10/16/2019), 180 mg (10/30/2019), 180 mg (11/22/2019), 180 mg (12/06/2019), 180 mg (12/20/2019), 180 mg (01/03/2020), 180 mg (01/29/2020), 180 mg (02/12/2020), 180 mg (02/26/2020), 180 mg (03/11/2020), 180 mg (04/08/2020), 180 mg (04/24/2020), 180 mg (05/08/2020), 180 mg (05/22/2020), 180 mg (06/10/2020), 180 mg (06/25/2020) fluorouracil (ADRUCIL) 5,000 mg in sodium chloride 0.9 % 150 mL chemo infusion, 5,050 mg, Intravenous, 1 Day/Dose, 23 of 25 cycles Administration: 5,000 mg (07/24/2019), 5,000 mg (08/07/2019), 5,000 mg (08/21/2019), 5,050 mg (09/04/2019), 5,000 mg (10/04/2019), 5,000 mg (10/16/2019), 5,000 mg (11/22/2019), 5,000 mg (12/06/2019), 5,000 mg (12/20/2019), 5,000 mg (01/03/2020), 5,000 mg (01/29/2020), 5,000 mg (02/12/2020), 5,000 mg (02/26/2020), 5,000 mg (03/11/2020), 5,000 mg (03/25/2020), 5,000 mg (04/08/2020), 5,000 mg (04/24/2020), 5,000 mg (05/08/2020), 5,000 mg (05/22/2020), 5,000 mg (06/10/2020), 5,000 mg (06/25/2020) bevacizumab-bvzr (ZIRABEV) 400 mg in sodium chloride 0.9 % 100 mL chemo infusion, 5 mg/kg = 400 mg, Intravenous,  Once, 23 of 25 cycles Administration: 400 mg (08/07/2019), 400 mg (08/21/2019), 400 mg (09/04/2019), 400 mg (09/20/2019), 400 mg (10/04/2019), 400 mg (10/16/2019), 400 mg (10/30/2019), 400 mg (11/22/2019), 400 mg (12/06/2019), 400 mg (12/20/2019), 400 mg (01/03/2020), 400 mg (01/29/2020), 400 mg (02/12/2020), 400 mg (02/26/2020), 400 mg (03/11/2020), 400 mg (03/25/2020), 400 mg (04/08/2020), 400 mg (04/24/2020), 400 mg (05/08/2020), 400 mg (05/22/2020), 400 mg (06/10/2020), 400 mg (06/25/2020)  for chemotherapy treatment.    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 NTHL1 is associated with autosomal recessive NTHL1-associated polyposis, meaning Mr. Byard is a carrier of this condition and does not have it himself. VUS in RAD50 also identified on the [facility] Multi-Cancer+RNA panel. The report date is 05/11/2022.  The Multi-Cancer Panel + RNA offered by [facility] includes sequencing and/or deletion duplication testing of the following 84 genes: AIP, ALK, APC, ATM, AXIN2,BAP1,  BARD1, BLM, BMPR1A, BRCA1, BRCA2, BRIP1, CASR, CDC73, CDH1, CDK4, CDKN1B, CDKN1C, CDKN2A (p14ARF), CDKN2A (p16INK4a), CEBPA, CHEK2, CTNNA1, DICER1, DIS3L2, EGFR (c.2369C>T, p.Thr790Met variant only), EPCAM (Deletion/duplication testing only), FH, FLCN, GATA2, GPC3, GREM1 (Promoter region deletion/duplication testing only), HOXB13 (c.251G>A, p.Gly84Glu), HRAS, KIT, MAX, MEN1, MET, MITF (c.952G>A, p.Glu318Lys variant only), MLH1, MSH2, MSH3, MSH6, MUTYH, NBN, NF1, NF2, NTHL1, PALB2, PDGFRA, PHOX2B, PMS2, POLD1, POLE, POT1, PRKAR1A, PTCH1, PTEN, RAD50, RAD51C, RAD51D, RB1, RECQL4, RET, RUNX1, SDHAF2, SDHA (sequence changes only), SDHB, SDHC, SDHD, SMAD4, SMARCA4, SMARCB1, SMARCE1, STK11, SUFU, TERC, TERT, TMEM127, TP53, TSC1, TSC2, VHL, WRN and WT1.     ALLERGIES:  is  allergic to ace inhibitors.  MEDICATIONS:  Current Outpatient Medications  Medication Sig Dispense Refill   aspirin EC 81 MG tablet Take 1 tablet (81 mg total) by mouth daily. Swallow whole. 30 tablet 11   atorvastatin (LIPITOR) 20 MG tablet Take 1 tablet (20 mg total) by mouth daily. 30 tablet 0   clopidogrel (PLAVIX) 75 MG tablet Take 1 tablet (75 mg total) by mouth daily. 30 tablet 11   oxyCODONE (OXY IR/ROXICODONE) 5 MG immediate release tablet  Take 1 tablet (5 mg total) by mouth every 8 (eight) hours as needed for severe pain. 60 tablet 0   tamsulosin (FLOMAX) 0.4 MG CAPS capsule Take 1 capsule (0.4 mg total) by mouth daily. (Patient not taking: Reported on 07/07/2022) 90 capsule 1   No current facility-administered medications for this visit.   Facility-Administered Medications Ordered in Other Visits  Medication Dose Route Frequency Provider Last Rate Last Admin   atropine 1 MG/ML injection            palonosetron (ALOXI) 0.25 MG/5ML injection             VITAL SIGNS: BP 126/68   Pulse 64   Temp 97.6 F (36.4 C) (Tympanic)   Resp 20  There were no vitals filed for this visit.  Estimated body mass index is 20.75 kg/m as calculated from the following:   Height as of 07/01/22: 6' (1.829 m).   Weight as of 07/01/22: 153 lb (69.4 kg).  LABS: CBC:    Component Value Date/Time   WBC 5.6 07/07/2022 0918   HGB 10.7 (L) 07/07/2022 0918   HGB 13.3 09/18/2013 0424   HCT 32.7 (L) 07/07/2022 0918   HCT 38.1 (L) 09/18/2013 0424   PLT 208 07/07/2022 0918   PLT 307 09/18/2013 0424   MCV 100.0 07/07/2022 0918   MCV 94 09/18/2013 0424   NEUTROABS 4.1 07/07/2022 0918   NEUTROABS 17.0 (H) 09/18/2013 0424   LYMPHSABS 0.7 07/07/2022 0918   LYMPHSABS 0.9 (L) 09/18/2013 0424   MONOABS 0.5 07/07/2022 0918   MONOABS 0.4 09/18/2013 0424   EOSABS 0.3 07/07/2022 0918   EOSABS 0.0 09/18/2013 0424   BASOSABS 0.0 07/07/2022 0918   BASOSABS 0.0 09/18/2013 0424   Comprehensive Metabolic Panel:    Component Value Date/Time   NA 138 07/07/2022 0918   NA 137 09/18/2013 0424   K 3.8 07/07/2022 0918   K 4.5 09/18/2013 0424   CL 108 07/07/2022 0918   CL 107 09/18/2013 0424   CO2 26 07/07/2022 0918   CO2 28 09/18/2013 0424   BUN 11 07/07/2022 0918   BUN 19 (H) 09/18/2013 0424   CREATININE 0.69 07/07/2022 0918   CREATININE 1.15 09/18/2013 0424   GLUCOSE 164 (H) 07/07/2022 0918   GLUCOSE 196 (H) 09/18/2013 0424   CALCIUM 8.1 (L)  07/07/2022 0918   CALCIUM 9.1 09/18/2013 0424   AST 53 (H) 07/07/2022 0918   ALT 25 07/07/2022 0918   ALKPHOS 180 (H) 07/07/2022 0918   BILITOT 1.0 07/07/2022 0918   PROT 6.9 07/07/2022 0918   ALBUMIN 2.5 (L) 07/07/2022 0918    RADIOGRAPHIC STUDIES: US Paracentesis  Result Date: 07/06/2022 INDICATION: Patient with a history of colon cancer with liver metastases. Patient now presents with ascites. Interventional radiology asked to perform a therapeutic paracentesis. EXAM: ULTRASOUND GUIDED PARACENTESIS MEDICATIONS: 1% lidocaine 10 mL COMPLICATIONS: None immediate. PROCEDURE: Informed written consent was obtained from the patient after a discussion of the risks, benefits and alternatives to treatment. A  timeout was performed prior to the initiation of the procedure. Initial ultrasound scanning demonstrates a large amount of ascites within the left lower abdominal quadrant. The left lower abdomen was prepped and draped in the usual sterile fashion. 1% lidocaine was used for local anesthesia. Following this, a 19 gauge, 7-cm, Yueh catheter was introduced. An ultrasound image was saved for documentation purposes. The paracentesis was performed. The catheter was removed and a dressing was applied. The patient tolerated the procedure well without immediate post procedural complication. FINDINGS: A total of approximately 2 L of clear yellow fluid was removed. IMPRESSION: Successful ultrasound-guided paracentesis yielding 2 liters of peritoneal fluid. Read by: Soyla Dryer, NP Electronically Signed   By: Ruthann Cancer M.D.   On: 07/06/2022 16:09   PERIPHERAL VASCULAR CATHETERIZATION  Result Date: 07/01/2022 See surgical note for result.  CT CHEST ABDOMEN PELVIS W CONTRAST  Result Date: 06/30/2022 CLINICAL DATA:  Restaging metastatic colon cancer. * Tracking Code: BO * EXAM: CT CHEST, ABDOMEN, AND PELVIS WITH CONTRAST TECHNIQUE: Multidetector CT imaging of the chest, abdomen and pelvis was performed  following the standard protocol during bolus administration of intravenous contrast. RADIATION DOSE REDUCTION: This exam was performed according to the departmental dose-optimization program which includes automated exposure control, adjustment of the mA and/or kV according to patient size and/or use of iterative reconstruction technique. CONTRAST:  180m OMNIPAQUE IOHEXOL 300 MG/ML  SOLN COMPARISON:  Multiple previous imaging studies. The most recent CT scan is 04/17/2019 the D FINDINGS: CT CHEST FINDINGS Cardiovascular: The heart is normal in size. No pericardial effusion. The aorta is within normal limits in caliber for age. Stable advanced atherosclerotic changes. Stable three-vessel coronary artery calcifications. Mediastinum/Nodes: Stable scattered small mediastinal nodes. Right hilar node on image 29/2 measures 18 mm and is stable. Left hilar node on image number 31/2 measures 14 mm and previously measured 13 mm. Subcarinal node on image 34/2 measures 10 mm and is unchanged. The esophagus is grossly normal. Small scattered endotracheal lesions likely representing inflammatory debris/mucous. Lungs/Pleura: Progressive pulmonary metastatic disease. Left upper lobe pulmonary nodule on image number 28/4 measures 12 mm and previously measured 10 mm. Bilobed left upper lobe lesion on image 70/4 measures 26 x 16 mm and previously measured 23 x 13 mm. Adjacent more peripheral nodule on image number 65/4 measures 18 mm and previously measured 15 mm. Right upper lobe pulmonary lesion image 72/4 measures 20 x 17 mm and previously measured 17 x 12 mm. 3 cm left lower lobe lesion on image number 118/4 previously measured 2.4 cm. Numerous other smaller pulmonary nodules are stable to slightly larger. No definite new pulmonary lesions. Stable moderate-sized left pleural effusion. Musculoskeletal: No significant bony findings. No evidence of osseous metastatic disease. CT ABDOMEN PELVIS FINDINGS Hepatobiliary: Dominant  metastatic lesion at the hepatic dome is slightly larger. It measures 7.9 x 7.1 cm on image 52/2 and measured 7.5 x 7.1 cm on the prior CT. The largest left hepatic lobe lesion measures 5.5 x 5.0 cm and previously measured 5.6 x 4.9 cm. Other smaller metastatic lesions are stable. No intrahepatic biliary dilatation. Stable appearance of the gallbladder. Stable common bile duct dilatation. Pancreas: No mass, inflammation or ductal dilatation. Spleen: Normal size.  No focal lesions. Adrenals/Urinary Tract: The left adrenal gland is normal and stable. Slightly larger large infiltrative mass involving the right adrenal gland and right kidney. This measures approximately 7.9 x 3.5 cm and previously measured 6.93.9 cm. Significant compression of the IVC. Stomach/Bowel: The stomach, duodenum, small bowel  and colon are grossly normal. Vascular/Lymphatic: Stable advanced atherosclerotic calcification involving the aorta and iliac arteries. Stable celiac axis, periportal and gastrohepatic ligament adenopathy. Reproductive: The prostate gland and seminal vesicles are unremarkable. Other: Persistent scattered omental and peritoneal surface lesions consistent with peritoneal carcinomatosis. Right-sided lesion near the ascending colon on image number 77/2 measures 17 mm and previously measured 16 mm. 15 mm lesion near the left hepatic lobe on image 62/2 previously measured 10 mm. The lesion between the right kidney and adjacent small bowel loops measures 3.3 x 2.5 cm and previously measured 2.8 x 2.3 cm. Progressive ascites. Musculoskeletal: No significant bony findings. IMPRESSION: 1. Progressive pulmonary metastatic disease. 2. Stable mediastinal and hilar lymphadenopathy. 3. Overall stable hepatic metastatic lesions. 4. Slight interval increase in size of the right adrenal gland and right renal mass. 5. Progressive ascites. 6. Stable to slightly larger omental and peritoneal surface lesions consistent with peritoneal  carcinomatosis. 7. Stable moderate-sized left pleural effusion. 8. Stable advanced atherosclerotic calcification involving the thoracic and abdominal aorta and iliac arteries. Aortic Atherosclerosis (ICD10-I70.0). Electronically Signed   By: Marijo Sanes M.D.   On: 06/30/2022 16:48   ECHOCARDIOGRAM COMPLETE BUBBLE STUDY  Result Date: 06/14/2022    ECHOCARDIOGRAM REPORT   Patient Name:   KAORU REZENDES Date of Exam: 06/14/2022 Medical Rec #:  [id number]        Height:       72.0 in Accession #:    [id number]       Weight:       152.4 lb Date of Birth:  1941/04/08       BSA:          1.898 m Patient Age:    31 years         BP:           112/64 mmHg Patient Gender: M                HR:           53 bpm. Exam Location:  Inpatient Procedure: 2D Echo, Cardiac Doppler and Color Doppler Indications:     Stroke I63.9  History:         Patient has no prior history of Echocardiogram examinations.  Sonographer:     Darlina Sicilian RDCS Referring Phys:  [id number] JAN A MANSY Diagnosing Phys: Yolonda Kida MD IMPRESSIONS  1. Left ventricular ejection fraction, by estimation, is 65 to 70%. The left ventricle has normal function. The left ventricle has no regional wall motion abnormalities. Left ventricular diastolic parameters are consistent with Grade I diastolic dysfunction (impaired relaxation).  2. Right ventricular systolic function is normal. The right ventricular size is normal.  3. The mitral valve is normal in structure. No evidence of mitral valve regurgitation.  4. The aortic valve is normal in structure. Aortic valve regurgitation is not visualized. FINDINGS  Left Ventricle: Left ventricular ejection fraction, by estimation, is 65 to 70%. The left ventricle has normal function. The left ventricle has no regional wall motion abnormalities. The left ventricular internal cavity size was normal in size. There is  no left ventricular hypertrophy. Left ventricular diastolic parameters are consistent with Grade I  diastolic dysfunction (impaired relaxation). Right Ventricle: The right ventricular size is normal. No increase in right ventricular wall thickness. Right ventricular systolic function is normal. Left Atrium: Left atrial size was normal in size. Right Atrium: Right atrial size was normal in size. Pericardium: There is no evidence of pericardial effusion. Mitral  Valve: The mitral valve is normal in structure. No evidence of mitral valve regurgitation. Tricuspid Valve: The tricuspid valve is normal in structure. Tricuspid valve regurgitation is trivial. Aortic Valve: The aortic valve is normal in structure. Aortic valve regurgitation is not visualized. Pulmonic Valve: The pulmonic valve was normal in structure. Pulmonic valve regurgitation is not visualized. Aorta: The ascending aorta was not well visualized. IAS/Shunts: No atrial level shunt detected by color flow Doppler.  LEFT VENTRICLE PLAX 2D LVIDd:         4.39 cm   Diastology LVIDs:         2.76 cm   LV e' medial:    3.92 cm/s LV PW:         0.89 cm   LV E/e' medial:  11.7 LV IVS:        1.06 cm   LV e' lateral:   5.77 cm/s LVOT diam:     2.20 cm   LV E/e' lateral: 7.9 LV SV:         60 LV SV Index:   32 LVOT Area:     3.80 cm  RIGHT VENTRICLE RV S prime:     11.20 cm/s TAPSE (M-mode): 2.4 cm LEFT ATRIUM           Index        RIGHT ATRIUM           Index LA diam:      2.60 cm 1.37 cm/m   RA Area:     12.00 cm LA Vol (A2C): 44.3 ml 23.34 ml/m  RA Volume:   29.80 ml  15.70 ml/m LA Vol (A4C): 22.4 ml 11.80 ml/m  AORTIC VALVE LVOT Vmax:   79.00 cm/s LVOT Vmean:  53.000 cm/s LVOT VTI:    0.158 m  AORTA Ao Root diam: 3.50 cm MITRAL VALVE MV Area (PHT): 1.48 cm    SHUNTS MV Decel Time: 512 msec    Systemic VTI:  0.16 m MV E velocity: 45.80 cm/s  Systemic Diam: 2.20 cm MV A velocity: 73.70 cm/s MV E/A ratio:  0.62 Dwayne D Callwood MD Electronically signed by Yolonda Kida MD Signature Date/Time: 06/14/2022/4:47:10 PM    Final    CT ANGIO NECK W OR WO  CONTRAST  Addendum Date: 06/14/2022   ADDENDUM REPORT: 06/14/2022 11:10 ADDENDUM: Study discussed by telephone with Dr. Frederich Chick LAMA on 06/14/2022 at 1101 hours. Electronically Signed   By: Genevie Ann M.D.   On: 06/14/2022 11:10   Result Date: 06/14/2022 CLINICAL DATA:  81 year old male with small vessel infarcts in the left basal ganglia and left occipital lobe white matter on brain MRI 2 days ago. Metastatic colon cancer. EXAM: CT ANGIOGRAPHY HEAD AND NECK TECHNIQUE: Multidetector CT imaging of the head and neck was performed using the standard protocol during bolus administration of intravenous contrast. Multiplanar CT image reconstructions and MIPs were obtained to evaluate the vascular anatomy. Carotid stenosis measurements (when applicable) are obtained utilizing NASCET criteria, using the distal internal carotid diameter as the denominator. RADIATION DOSE REDUCTION: This exam was performed according to the departmental dose-optimization program which includes automated exposure control, adjustment of the mA and/or kV according to patient size and/or use of iterative reconstruction technique. CONTRAST:  111m OMNIPAQUE IOHEXOL 350 MG/ML SOLN COMPARISON:  Head CT and brain MRI 03/12/2022. Restaging CT Chest, Abdomen, and Pelvis 04/16/2022 FINDINGS: CT HEAD Brain: Calcified atherosclerosis at the skull base. No acute intracranial hemorrhage identified. No midline shift, mass effect, or evidence  of intracranial mass lesion. Lacunar infarcts in the left basal ganglia and occipital lobe white matter remain occult by CT. Stable gray-white matter differentiation throughout the brain. Chronic posterosuperior left MCA territory encephalomalacia. Chronic lacunar infarct of the left caudate. Calvarium and skull base: Negative. Paranasal sinuses: Visualized paranasal sinuses and mastoids are stable and well aerated. Orbits: Visualized orbits and scalp soft tissues are within normal limits. CTA NECK Skeleton: Absent  dentition. Cervical spine degeneration with degenerative appearing ankylosis of C4-C5 facets. Bulky lower cervical endplate osteophytes with C6-C7 ankylosis. No acute osseous abnormality identified. Upper chest: Stable layering left pleural effusion. Numerous small upper lung nodules compatible with pulmonary metastatic disease appears stable since the June restaging exam. No superior mediastinal lymphadenopathy. Right chest Port-A-Cath remains in place. Other neck: No superimposed neck mass or lymphadenopathy identified. Aortic arch: Calcified aortic atherosclerosis. Possible four vessel arch configuration, the left vertebral artery arises at or near the arch on series 5, image 180. Calcified aortic atherosclerosis. Right carotid system: Brachiocephalic artery plaque without stenosis. Bulky soft plaque in the posterior right ICA origin, partially calcified at the bulb. Subsequent origin stenosis up to 60 % with respect to the distal vessel (series 9, image 70). Right ICA remains patent. Left carotid system: Negative left CCA origins. Soft plaque in the medial left CCA before the bifurcation on series 5, image 124. No significant stenosis. Bulky soft plaque or thrombus and mild calcified plaque in the left ICA origin and bulb with subtotal occlusion, radiographic string sign stenosis (series 9, image 145). Subsequent decreased caliber of the downstream left ICA which remains patent to the skull base. Vertebral arteries: Proximal right subclavian artery plaque and tortuosity without stenosis. Normal right vertebral artery origin. Dominant right vertebral artery is patent to the skull base with no plaque or stenosis. Calcified atherosclerosis at the left vertebral artery origin which appears to be from the arch on series 8, image 182. Severe origin stenosis but the left vertebral artery remains patent. Non dominant left vertebral is patent to the skull base without additional plaque or stenosis. CTA HEAD Posterior  circulation: No distal vertebral plaque or stenosis. Patent PICA origins and vertebrobasilar junction. Patent basilar artery without stenosis. SCA and PCA origins are normal. Posterior communicating arteries are diminutive or absent. Mild bilateral PCA P1 and P2 segment irregularity and stenosis greater on the left (series 10, image 67). Patent PCA branches. Anterior circulation: Both ICA siphons are patent. Moderate bilateral siphon calcified plaque. But no significant siphon stenosis. Normal ophthalmic artery origins. Patent carotid termini, MCA and ACA origins. Normal anterior communicating artery. Bilateral ACA branches are within normal limits. Left MCA M1 segment and bifurcation are patent without stenosis. Left MCA branches are within normal limits. Right MCA M1 segment and bifurcation are patent without stenosis. Right MCA branches are within normal limits. Venous sinuses: Patent. Anatomic variants: Left vertebral artery arises directly from the arch, 4 vessel arch configuration. Mildly dominant right vertebral. Review of the MIP images confirms the above findings IMPRESSION: 1. Negative for large vessel occlusion but Positive for SUBTOTAL OCCLUSION of the Left ICA origin and bulb due to bulky soft plaque or thrombus. RADIOGRAPHIC STRING SIGN STENOSIS. 2. Up to 60% stenosis at the Right ICA origin due to bulky soft plaque. 3. 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 4. But no other significant arterial stenosis in the head or neck. Mild for age intracranial atherosclerosis. 5. Small vessel infarcts seen on MRI 2 days  ago remain occult by CT. No new intracranial abnormality. 6. Stable layering left pleural effusion and pulmonary metastatic disease since June. 7. Aortic Atherosclerosis (ICD10-I70.0). Electronically Signed: By: Genevie Ann M.D. On: 06/14/2022 10:55   CT ANGIO HEAD W OR WO CONTRAST  Addendum Date: 06/14/2022   ADDENDUM REPORT: 06/14/2022  11:10 ADDENDUM: Study discussed by telephone with Dr. Frederich Chick LAMA on 06/14/2022 at 1101 hours. Electronically Signed   By: Genevie Ann M.D.   On: 06/14/2022 11:10   Result Date: 06/14/2022 CLINICAL DATA:  81 year old male with small vessel infarcts in the left basal ganglia and left occipital lobe white matter on brain MRI 2 days ago. Metastatic colon cancer. EXAM: CT ANGIOGRAPHY HEAD AND NECK TECHNIQUE: Multidetector CT imaging of the head and neck was performed using the standard protocol during bolus administration of intravenous contrast. Multiplanar CT image reconstructions and MIPs were obtained to evaluate the vascular anatomy. Carotid stenosis measurements (when applicable) are obtained utilizing NASCET criteria, using the distal internal carotid diameter as the denominator. RADIATION DOSE REDUCTION: This exam was performed according to the departmental dose-optimization program which includes automated exposure control, adjustment of the mA and/or kV according to patient size and/or use of iterative reconstruction technique. CONTRAST:  130m OMNIPAQUE IOHEXOL 350 MG/ML SOLN COMPARISON:  Head CT and brain MRI 03/12/2022. Restaging CT Chest, Abdomen, and Pelvis 04/16/2022 FINDINGS: CT HEAD Brain: Calcified atherosclerosis at the skull base. No acute intracranial hemorrhage identified. No midline shift, mass effect, or evidence of intracranial mass lesion. Lacunar infarcts in the left basal ganglia and occipital lobe white matter remain occult by CT. Stable gray-white matter differentiation throughout the brain. Chronic posterosuperior left MCA territory encephalomalacia. Chronic lacunar infarct of the left caudate. Calvarium and skull base: Negative. Paranasal sinuses: Visualized paranasal sinuses and mastoids are stable and well aerated. Orbits: Visualized orbits and scalp soft tissues are within normal limits. CTA NECK Skeleton: Absent dentition. Cervical spine degeneration with degenerative appearing ankylosis  of C4-C5 facets. Bulky lower cervical endplate osteophytes with C6-C7 ankylosis. No acute osseous abnormality identified. Upper chest: Stable layering left pleural effusion. Numerous small upper lung nodules compatible with pulmonary metastatic disease appears stable since the June restaging exam. No superior mediastinal lymphadenopathy. Right chest Port-A-Cath remains in place. Other neck: No superimposed neck mass or lymphadenopathy identified. Aortic arch: Calcified aortic atherosclerosis. Possible four vessel arch configuration, the left vertebral artery arises at or near the arch on series 5, image 180. Calcified aortic atherosclerosis. Right carotid system: Brachiocephalic artery plaque without stenosis. Bulky soft plaque in the posterior right ICA origin, partially calcified at the bulb. Subsequent origin stenosis up to 60 % with respect to the distal vessel (series 9, image 70). Right ICA remains patent. Left carotid system: Negative left CCA origins. Soft plaque in the medial left CCA before the bifurcation on series 5, image 124. No significant stenosis. Bulky soft plaque or thrombus and mild calcified plaque in the left ICA origin and bulb with subtotal occlusion, radiographic string sign stenosis (series 9, image 145). Subsequent decreased caliber of the downstream left ICA which remains patent to the skull base. Vertebral arteries: Proximal right subclavian artery plaque and tortuosity without stenosis. Normal right vertebral artery origin. Dominant right vertebral artery is patent to the skull base with no plaque or stenosis. Calcified atherosclerosis at the left vertebral artery origin which appears to be from the arch on series 8, image 182. Severe origin stenosis but the left vertebral artery remains patent. Non dominant left vertebral is patent  to the skull base without additional plaque or stenosis. CTA HEAD Posterior circulation: No distal vertebral plaque or stenosis. Patent PICA origins and  vertebrobasilar junction. Patent basilar artery without stenosis. SCA and PCA origins are normal. Posterior communicating arteries are diminutive or absent. Mild bilateral PCA P1 and P2 segment irregularity and stenosis greater on the left (series 10, image 67). Patent PCA branches. Anterior circulation: Both ICA siphons are patent. Moderate bilateral siphon calcified plaque. But no significant siphon stenosis. Normal ophthalmic artery origins. Patent carotid termini, MCA and ACA origins. Normal anterior communicating artery. Bilateral ACA branches are within normal limits. Left MCA M1 segment and bifurcation are patent without stenosis. Left MCA branches are within normal limits. Right MCA M1 segment and bifurcation are patent without stenosis. Right MCA branches are within normal limits. Venous sinuses: Patent. Anatomic variants: Left vertebral artery arises directly from the arch, 4 vessel arch configuration. Mildly dominant right vertebral. Review of the MIP images confirms the above findings IMPRESSION: 1. Negative for large vessel occlusion but Positive for SUBTOTAL OCCLUSION of the Left ICA origin and bulb due to bulky soft plaque or thrombus. RADIOGRAPHIC STRING SIGN STENOSIS. 2. Up to 60% stenosis at the Right ICA origin due to bulky soft plaque. 3. 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 4. But no other significant arterial stenosis in the head or neck. Mild for age intracranial atherosclerosis. 5. Small vessel infarcts seen on MRI 2 days ago remain occult by CT. No new intracranial abnormality. 6. Stable layering left pleural effusion and pulmonary metastatic disease since June. 7. Aortic Atherosclerosis (ICD10-I70.0). Electronically Signed: By: Genevie Ann M.D. On: 06/14/2022 10:55   US Carotid Bilateral (at [facility] and [facility] only)  Result Date: 06/13/2022 CLINICAL DATA:  Stroke Syncope Hyperlipidemia Diabetes Tobacco abuse EXAM: BILATERAL CAROTID DUPLEX  ULTRASOUND TECHNIQUE: Pearline Cables scale imaging, color Doppler and duplex ultrasound were performed of bilateral carotid and vertebral arteries in the neck. COMPARISON:  None available FINDINGS: Criteria: Quantification of carotid stenosis is based on velocity parameters that correlate the residual internal carotid diameter with NASCET-based stenosis levels, using the diameter of the distal internal carotid lumen as the denominator for stenosis measurement. The following velocity measurements were obtained: RIGHT ICA: 145/44 cm/sec CCA: 22/29 cm/sec SYSTOLIC ICA/CCA RATIO:  1.8 ECA: 107 cm/sec LEFT ICA: 352/100 cm/sec CCA: 79/8 cm/sec SYSTOLIC ICA/CCA RATIO:  7.3 ECA: 91 cm/sec RIGHT CAROTID ARTERY: Moderate focal heterogeneous plaque noted in the carotid bulb. RIGHT VERTEBRAL ARTERY:  Antegrade flow. LEFT CAROTID ARTERY: Extensive heterogeneous plaque of the left internal carotid artery origin. LEFT VERTEBRAL ARTERY:  Antegrade flow. IMPRESSION: 1. 50-69% stenosis of the right internal carotid artery. 2. Greater than 70% stenosis of the left carotid artery. 3. Further evaluation with CT angiography of the neck should be performed to better quantify plaque burden and degree of stenosis. Electronically Signed   By: Miachel Roux M.D.   On: 06/13/2022 15:02   MR BRAIN WO CONTRAST  Result Date: 06/12/2022 CLINICAL DATA:  Right-sided weakness EXAM: MRI HEAD WITHOUT CONTRAST TECHNIQUE: Multiplanar, multiecho pulse sequences of the brain and surrounding structures were obtained without intravenous contrast. COMPARISON:  None Available. FINDINGS: Brain: There are small foci of acute/subacute ischemia the left internal capsule posterior limb and in the left occipital lobe. No acute or chronic hemorrhage. There is multifocal hyperintense T2-weighted signal within the white matter. Generalized volume loss. Old left basal ganglia small vessel infarct. The midline structures are normal. Vascular:  Major flow voids are preserved.  Skull and upper cervical spine: Normal calvarium and skull base. Visualized upper cervical spine and soft tissues are normal. Sinuses/Orbits:No paranasal sinus fluid levels or advanced mucosal thickening. No mastoid or middle ear effusion. Normal orbits. IMPRESSION: Small foci of acute/subacute ischemia in the left internal capsule posterior limb and left occipital lobe. No hemorrhage or mass effect. Electronically Signed   By: Ulyses Jarred M.D.   On: 06/12/2022 21:01   CT HEAD WO CONTRAST  Result Date: 06/12/2022 CLINICAL DATA:  Neurological deficit EXAM: CT HEAD WITHOUT CONTRAST TECHNIQUE: Contiguous axial images were obtained from the base of the skull through the vertex without intravenous contrast. RADIATION DOSE REDUCTION: This exam was performed according to the departmental dose-optimization program which includes automated exposure control, adjustment of the mA and/or kV according to patient size and/or use of iterative reconstruction technique. COMPARISON:  Head CT dated November 09, 2020 FINDINGS: Brain: Old lacunar infarcts of the bilateral basal ganglia. No evidence of acute infarction, hemorrhage, hydrocephalus, extra-axial collection or mass lesion/mass effect. Vascular: No hyperdense vessel or unexpected calcification. Skull: Normal. Negative for fracture or focal lesion. Sinuses/Orbits: No acute finding. Other: None. IMPRESSION: No acute intracranial abnormality. Electronically Signed   By: Yetta Glassman M.D.   On: 06/12/2022 17:46    PERFORMANCE STATUS (ECOG) : 1 - Symptomatic but completely ambulatory  Review of Systems Unless otherwise noted, a complete review of systems is negative.  Physical Exam General: NAD Cardiovascular: regular rate and rhythm Pulmonary: clear ant fields Abdomen: Distended, nontender, positive bowel sounds GU: no suprapubic tenderness Extremities: no edema, no joint deformities Skin: no rashes Neurological: Weakness but otherwise  nonfocal  Assessment and Plan: Abdominal distention -suspect that this is reflective of disease progression in light of recent CT scan with peritoneal carcinomatosis.  Patient is nontoxic-appearing and obstruction unlikely in absence of severe symptomatology.  Labs are unremarkable other than slight rise in AST and alk phos, likely again from cancer burden.  Patient reports that distention was improved with paracentesis yesterday.  I spoke with radiology PA he was able to drain as much fluid possible at 2 L.  It was not felt to be residual fluid or loculation.  Discussed with patient that fluid will likely reaccumulate necessitating repeat paracentesis or potential Tenckhoff catheter.  Per telephone note on 07/02/2022 with Dr. Rogue Bussing, family was updated in regards to disease status and he was reaching out to [facility] to explore clinical trial options but likely performance status and recent stroke precluded any further options.  ED triggers reviewed in detail today.  We will plan to bring patient back next week for repeat labs/supportive care and to further discuss goals.  Follow up next week for labs/MD/palliative care   Patient expressed understanding and was in agreement with this plan. He also understands that He can call clinic at any time with any questions, concerns, or complaints.   Thank you for allowing me to participate in the care of this very pleasant patient.   Time Total: 20 minutes  Visit consisted of counseling and education dealing with the complex and emotionally intense issues of symptom management in the setting of serious illness.Greater than 50%  of this time was spent counseling and coordinating care related to the above assessment and plan.  Signed by: Altha Harm, PhD, NP-C

## 2022-07-08 NOTE — Telephone Encounter (Signed)
RN Kristen Cardinal- PT-[place] [phone] I left a detailed vm for Cecilie Lowers ok to approve continuation of PT for 3 more weeks. Verbal order given on behalf of Beckey Rutter, NP

## 2022-07-09 ENCOUNTER — Encounter: Payer: Self-pay | Admitting: Internal Medicine

## 2022-07-10 ENCOUNTER — Telehealth: Payer: Self-pay

## 2022-07-10 ENCOUNTER — Emergency Department
Admission: EM | Admit: 2022-07-10 | Discharge: 2022-07-10 | Disposition: A | Discharge status: Home / Self Care | Payer: Medicare HMO | Attending: Emergency Medicine | Admitting: Emergency Medicine

## 2022-07-10 ENCOUNTER — Other Ambulatory Visit: Payer: Self-pay

## 2022-07-10 ENCOUNTER — Encounter: Payer: Self-pay | Admitting: Emergency Medicine

## 2022-07-10 DIAGNOSIS — R188 Other ascites: Secondary | ICD-10-CM | POA: Insufficient documentation

## 2022-07-10 DIAGNOSIS — R109 Unspecified abdominal pain: Secondary | ICD-10-CM | POA: Diagnosis present

## 2022-07-10 LAB — CBC WITH DIFFERENTIAL/PLATELET
Abs Immature Granulocytes: 0.02 10*3/uL (ref 0.00–0.07)
Basophils Absolute: 0.1 10*3/uL (ref 0.0–0.1)
Basophils Relative: 1 %
Eosinophils Absolute: 0.3 10*3/uL (ref 0.0–0.5)
Eosinophils Relative: 6 %
HCT: 34.3 % — ABNORMAL LOW (ref 39.0–52.0)
Hemoglobin: 10.9 g/dL — ABNORMAL LOW (ref 13.0–17.0)
Immature Granulocytes: 0 %
Lymphocytes Relative: 11 %
Lymphs Abs: 0.6 10*3/uL — ABNORMAL LOW (ref 0.7–4.0)
MCH: 32 pg (ref 26.0–34.0)
MCHC: 31.8 g/dL (ref 30.0–36.0)
MCV: 100.6 fL — ABNORMAL HIGH (ref 80.0–100.0)
Monocytes Absolute: 0.5 10*3/uL (ref 0.1–1.0)
Monocytes Relative: 9 %
Neutro Abs: 4.2 10*3/uL (ref 1.7–7.7)
Neutrophils Relative %: 73 %
Platelets: 215 10*3/uL (ref 150–400)
RBC: 3.41 MIL/uL — ABNORMAL LOW (ref 4.22–5.81)
RDW: 17 % — ABNORMAL HIGH (ref 11.5–15.5)
WBC: 5.8 10*3/uL (ref 4.0–10.5)
nRBC: 0 % (ref 0.0–0.2)

## 2022-07-10 LAB — COMPREHENSIVE METABOLIC PANEL
ALT: 21 U/L (ref 0–44)
AST: 40 U/L (ref 15–41)
Albumin: 2.6 g/dL — ABNORMAL LOW (ref 3.5–5.0)
Alkaline Phosphatase: 160 U/L — ABNORMAL HIGH (ref 38–126)
Anion gap: 8 (ref 5–15)
BUN: 10 mg/dL (ref 8–23)
CO2: 25 mmol/L (ref 22–32)
Calcium: 8.4 mg/dL — ABNORMAL LOW (ref 8.9–10.3)
Chloride: 108 mmol/L (ref 98–111)
Creatinine, Ser: 0.89 mg/dL (ref 0.61–1.24)
GFR, Estimated: >60 mL/min (ref >60)
Glucose, Bld: 143 mg/dL — ABNORMAL HIGH (ref 70–99)
Potassium: 3.8 mmol/L (ref 3.5–5.1)
Sodium: 141 mmol/L (ref 135–145)
Total Bilirubin: 1 mg/dL (ref 0.3–1.2)
Total Protein: 7.4 g/dL (ref 6.5–8.1)

## 2022-07-10 NOTE — Telephone Encounter (Signed)
Patient's daughter called stating patient had paracentesis on 9/20 and today he is starting to fill "full again again with abdominal pain 5/10 and stomach feels tight". Advised per PA patient should go to hospital, ED. Daughter agrees and verbalizes understanding.

## 2022-07-10 NOTE — Discharge Instructions (Signed)
Please follow-up closely with Dr. Rogue Bussing on Wednesday as scheduled.  Please return to the emergency room right away if you have difficulty breathing, abdominal pain, begin experiencing fevers and vomiting, or other concerns arise.

## 2022-07-10 NOTE — ED Provider Notes (Signed)
[facility] Provider Note    Event Date/Time   First MD Initiated Contact with Patient 07/10/22 1222     (approximate)   History   Abdominal Pain   HPI  Oscar [name] is a 81 y.o. male who on review of discharge summary from September 14 has a history of stroke, also underwent carotid stenting  Patient also has stage IV colon cancer previous embolization of liver lesions   Recently evaluated for abdominal distention and had a paracentesis on 9/18 for 2 L  He reports the fluid has started to reaccumulate.  No difficulty breathing.  He is about the same amount of fluid as when he had it drained around 4 days ago.  No stomach pain no fevers no chills no nausea no vomiting.  Just reports that it started to feel slightly tight in the fluid seems to be reaccumulating  Physical Exam   Triage Vital Signs: ED Triage Vitals  Enc Vitals Group     BP 07/10/22 1147 128/72     Pulse Rate 07/10/22 1147 66     Resp 07/10/22 1147 18     Temp 07/10/22 1147 98.2 F (36.8 C)     Temp Source 07/10/22 1147 Oral     SpO2 07/10/22 1147 99 %     Weight 07/10/22 1147 154 lb (69.9 kg)     Height 07/10/22 1159 6' (1.829 m)     Head Circumference --      Peak Flow --      Pain Score 07/10/22 1147 4     Pain Loc --      Pain Edu? --      Excl. in [place]? --     Most recent vital signs: Vitals:   07/10/22 1147  BP: 128/72  Pulse: 66  Resp: 18  Temp: 98.2 F (36.8 C)  SpO2: 99%     General: Awake, no distress.  Appears somewhat malnourished, chronically ill but in no acute distress.  He and his daughter at the bedside are both very pleasant. CV:  Good peripheral perfusion.  Normal heart tones Resp:  Normal effort.  Clear lung sounds.  Speaks with full sentences without any distress.  Normal work of breathing.  No accessory muscle use Abd:  Very mild distention.  No tight distention.  Fluid wave is present.  Denies pain to palpation in any quadrant.  There is no  pain or rebound or guarding.  Umbilicus is just mildly protuberant soft Other:     ED Results / Procedures / Treatments   Labs (all labs ordered are listed, but only abnormal results are displayed) Labs Reviewed  CBC WITH DIFFERENTIAL/PLATELET - Abnormal; Notable for the following components:      Result Value   RBC 3.41 (*)    Hemoglobin 10.9 (*)    HCT 34.3 (*)    MCV 100.6 (*)    RDW 17.0 (*)    Lymphs Abs 0.6 (*)    All other components within normal limits  COMPREHENSIVE METABOLIC PANEL - Abnormal; Notable for the following components:   Glucose, Bld 143 (*)    Calcium 8.4 (*)    Albumin 2.6 (*)    Alkaline Phosphatase 160 (*)    All other components within normal limits     EKG     RADIOLOGY     PROCEDURES:  Critical Care performed: No  Procedures   MEDICATIONS ORDERED IN ED: Medications - No data to display  IMPRESSION / MDM / ASSESSMENT AND PLAN / ED COURSE  I reviewed the triage vital signs and the nursing notes.                              Differential diagnosis includes, but is not limited to, suspect reaccumulation of potential malignant ascites or other cause for ascites.  No fevers.  No elevated white count.  No systemic symptoms of illness and no associated abdominal pain.  He had recent paracentesis, and I suspect at this point he is likely experiencing reaccumulation of ascites from a noninfectious process.  No evidence of abscess to be highly suspicious for SBP  Discussed with Dr. Janese Banks. She advises no intervention at this time and follow-up with Oncology Wednesday (as scheduled).  Patient's presentation is most consistent with acute complicated illness / injury requiring diagnostic workup.  Labs interpreted as mild to moderate anemia.  CMP is remarkable for hypoalbuminemia and mildly elevated alkaline phosphatase which is in keeping with previous.  Patient has very mild ascites, not even enough that I would even consider being able to  safely perform paracentesis in the ER let alone the fact that I have a low pretest probability or concern for SBP.  He has a history of paracentesis recently, and is following with oncology.  There is no effect on his respirations and no difficulty breathing.   Patient will follow-up with oncology on Wednesday as planned.  Also Dr. Janese Banks advised me to inform patient that if he begins to develop ascites and is starting to cause issue that he can also call their clinic and they can schedule outpatient paracentesis for him.  We discussed careful return precautions though including if he develops a fever, abdominal pain, nausea and vomiting, or feels like he is having fluid accumulate to the point that he is starting to cause difficulty with his breathing or labored breathing he should return to the ER right away.  Patient and daughter agreeable  Return precautions discussed with patient and daughter.   Return precautions and treatment recommendations and follow-up discussed with the patient who is agreeable with the plan.   FINAL CLINICAL IMPRESSION(S) / ED DIAGNOSES   Final diagnoses:  Other ascites     Rx / DC Orders   ED Discharge Orders     None        Note:  This document was prepared using Dragon voice recognition software and may include unintentional dictation errors.   Delman Kitten, MD 07/10/22 1245

## 2022-07-10 NOTE — ED Triage Notes (Signed)
Pt arrives with c/o ABD pain/tightness that started 2 days ago. Pt had a paracentesis on Monday and had 2L pulled off. Per pt, the fluid has come back and MD told pt he might have to have a drain put in.

## 2022-07-15 ENCOUNTER — Encounter: Payer: Self-pay | Admitting: Internal Medicine

## 2022-07-15 ENCOUNTER — Inpatient Hospital Stay: Payer: Medicare HMO

## 2022-07-15 ENCOUNTER — Encounter: Payer: Medicare HMO | Admitting: Hospice and Palliative Medicine

## 2022-07-15 ENCOUNTER — Inpatient Hospital Stay (HOSPITAL_BASED_OUTPATIENT_CLINIC_OR_DEPARTMENT_OTHER): Payer: Medicare HMO | Admitting: Hospice and Palliative Medicine

## 2022-07-15 ENCOUNTER — Telehealth: Payer: Self-pay | Admitting: *Deleted

## 2022-07-15 ENCOUNTER — Inpatient Hospital Stay (HOSPITAL_BASED_OUTPATIENT_CLINIC_OR_DEPARTMENT_OTHER): Payer: Medicare HMO | Admitting: Internal Medicine

## 2022-07-15 DIAGNOSIS — C182 Malignant neoplasm of ascending colon: Secondary | ICD-10-CM

## 2022-07-15 DIAGNOSIS — G893 Neoplasm related pain (acute) (chronic): Secondary | ICD-10-CM | POA: Diagnosis not present

## 2022-07-15 MED ORDER — OXYCODONE HCL 5 MG PO TABS: 5.0000 mg | ORAL_TABLET | ORAL | 0 refills | Status: DC | PRN

## 2022-07-15 NOTE — Progress Notes (Signed)
Virtual Visit via Telephone Note  I connected with Oscar [name] on 07/15/22 at  2:00 PM EDT by telephone and verified that I am speaking with the correct person using two identifiers.  Location: Patient: Home Provider: Clinic   I discussed the limitations, risks, security and privacy concerns of performing an evaluation and management service by telephone and the availability of in person appointments. I also discussed with the patient that there may be a patient responsible charge related to this service. The patient expressed understanding and agreed to proceed.   History of Present Illness: Oscar [name] is a 81 y.o. male with multiple medical problems including stage IV colon cancer s/p FOLFIRI plus Avastin, oral TAS-102 plus bevacizumab.  Patient underwent embolization to liver lesions on 01/15/2021.  He is status post Y 90 to liver lesions on 04/04/2021.  CT scan December 2022 revealed disease progression with increasing lung nodule/left moderate pleural effusion and new liver lesion/soft tissue implant right kidney.   Patient was hospitalized 06/12/2022 to 06/07/2022 with acute left MCA CVA and was found to have left ICA stenosis.  Patient underwent stenting on 07/01/2022.   CT chest, abdomen, and pelvis on 06/30/2022 showed progressive pulmonary metastatic disease, stable hepatic masses, interval increase in size of right adrenal gland and right renal mass, progressive ascites, peritoneal carcinomatosis, moderate left sided pleural effusion.   Observations/Objective: Patient met virtually with Dr. Brahmanday today and decision was made to stop cancer treatment and focus on comfort/best supportive care.  Hospice was recommended.  I spoke with patient and daughter (Marianne).  Patient recognizes that his cancer is progressing and he confirmed a desire to focus on comfort at home.  He is in agreement with hospice involvement.  Symptomatically, he has had progressive abdominal pain but at  this point it is reasonably well controlled on oxycodone.  Patient does find that he is having to take the oxycodone more frequently.  We did discuss the option of starting a long-acting opioid if needed.  Assessment and Plan: Stage IV colon cancer -hospice referral  Neoplasm related pain -liberalize oxycodone to every 4 hours as needed #60  Follow Up Instructions: As needed   I discussed the assessment and treatment plan with the patient. The patient was provided an opportunity to ask questions and all were answered. The patient agreed with the plan and demonstrated an understanding of the instructions.   The patient was advised to call back or seek an in-person evaluation if the symptoms worsen or if the condition fails to improve as anticipated.  I provided 10 minutes of non-face-to-face time during this encounter.   JOSHUA R BORDERS, NP   

## 2022-07-15 NOTE — Progress Notes (Unsigned)
I connected with Oscar [name] on 07/15/22 at  1:15 PM EDT by video enabled telemedicine visit and verified that I am speaking with the correct person using two identifiers.  I discussed the limitations, risks, security and privacy concerns of performing an evaluation and management service by telemedicine and the availability of in-person appointments. I also discussed with the patient that there may be a patient responsible charge related to this service. The patient expressed understanding and agreed to proceed.    Other persons participating in the visit and their role in the encounter: RN/medical reconciliation Patient's location: home Provider's location: office  Oncology History Overview Note  # MAY 2020- 3. 03/09/19 Liver biopsy. Microscopic examination shows malignant cells with glandular architecture consistent with adenocarcinoma. The malignant cells are positive for CK20 and CDX-2. These findings support the clinical impression of metastatic colon adenocarcinoma. 4. 03/10/19 R hemicolectomy. Tumor site cecum. Adenocarcinoma. Mucinous features present. G2. No tumor deposits. Invades visceral peritoneum. No tumor perforation. LVI present. PNI not identified. All margins uninvolved. 1/12 LNs. PT4apN1. Periappendiceal inflammation c/w resolving abscess. Microsatellite stable (MSS). [Dr.Mettu; [facility]]  # SEP 4th 2020 [compared to May 2020]  Interval increase in size of the metastases to the hepatic dome, The metastasis to the left hepatic lobe is unchanged; 2.  New subcentimeter hypoattenuating lesion in the inferior right hepatic lobe, incompletely characterized on CT. 3.  Postsurgical changes following right hemicolectomy.  # OCT 2020- FOLFOX +avastin; CT dec 22nd 2020- [compared to [facility] sep 9th 2020]-Liver- slight progression versus stable disease; CT scan SEP 4th 2021- Progressive disease-left lower lobe lung nodule 8 mm [previously 4 mm]; increase in size of the hepatic metastases by few  millimeters.;  Soft tissue nodule adjacent to anastomotic site again increased by few millimeters.STOP FOLFOX; cont avastin  #  SEP 20th, 2021- FOLFIRI+ AVASTIN; HOLD FEB 2022- sec to poor tolerance [peptic ulcer]; 3/30-liver ablation-   [bland embolization of segment 2/3 liver lesion on 12/19/2020;  microwave ablation of both liver lesions.  # ?July 25th, 2022- BEV _ TAS 102.MAY 2022- s/p Liver ablation;Tas 102- stopped in Nov-Dec 2022-   # DEC 2022-progressive disease-lung and liver/abdomen; JAN 31st 2023-Stivarga 2 pills a day-3 weeks on 1 week off.  Hold off any dose escalation given ongoing fatigue/poor tolerance in general. MARCH 2023- discontinued Progression. II opinion at [facility]-   # MAY 2023- FOLFIRI+ Bev- s/p 9 cycles- Progression of disease/liver+lung.   # FEB /16-EGD [Dr.Anna-duodenal ulcer-? meloxicam]  # JAN 2022- COVID [s/p Mab infusion; pills; skin rash resolved.]  # NGS/F-ONE-MUTATED K-RAS [G]       Cancer of right colon ([place])  07/05/2019 Initial Diagnosis   Cancer of right colon ([place])   07/24/2019 - 06/25/2020 Chemotherapy   The patient had dexamethasone (DECADRON) 4 MG tablet, 8 mg, Oral, Daily, 1 of 1 cycle, Start date: --, End date: -- palonosetron (ALOXI) injection 0.25 mg, 0.25 mg, Intravenous,  Once, 23 of 25 cycles Administration: 0.25 mg (07/24/2019), 0.25 mg (08/07/2019), 0.25 mg (08/21/2019), 0.25 mg (09/04/2019), 0.25 mg (09/20/2019), 0.25 mg (10/04/2019), 0.25 mg (10/16/2019), 0.25 mg (10/30/2019), 0.25 mg (11/22/2019), 0.25 mg (12/06/2019), 0.25 mg (12/20/2019), 0.25 mg (01/03/2020), 0.25 mg (01/29/2020), 0.25 mg (02/12/2020), 0.25 mg (02/26/2020), 0.25 mg (03/11/2020), 0.25 mg (03/25/2020), 0.25 mg (04/08/2020), 0.25 mg (04/24/2020), 0.25 mg (05/08/2020), 0.25 mg (05/22/2020), 0.25 mg (06/10/2020), 0.25 mg (06/25/2020) leucovorin 800 mg in dextrose 5 % 250 mL infusion, 844 mg, Intravenous,  Once, 23 of 25 cycles Administration: 800 mg (07/24/2019), 800  mg (08/07/2019), 800 mg  (08/21/2019), 800 mg (09/04/2019), 800 mg (09/20/2019), 800 mg (10/04/2019), 800 mg (10/16/2019), 800 mg (10/30/2019), 800 mg (11/22/2019), 800 mg (12/06/2019), 800 mg (12/20/2019), 800 mg (01/03/2020), 800 mg (01/29/2020), 800 mg (02/12/2020), 800 mg (02/26/2020), 800 mg (03/11/2020), 800 mg (03/25/2020), 800 mg (04/08/2020), 800 mg (04/24/2020), 800 mg (05/08/2020), 800 mg (05/22/2020), 800 mg (06/10/2020), 800 mg (06/25/2020) oxaliplatin (ELOXATIN) 180 mg in dextrose 5 % 500 mL chemo infusion, 85 mg/m2 = 180 mg, Intravenous,  Once, 23 of 25 cycles Dose modification: 178 mg (original dose 85 mg/m2, Cycle 17, Reason: Other (see comments), Comment: insurance adjusted dose ) Administration: 180 mg (07/24/2019), 180 mg (08/07/2019), 180 mg (08/21/2019), 180 mg (09/04/2019), 180 mg (09/20/2019), 180 mg (10/04/2019), 180 mg (10/16/2019), 180 mg (10/30/2019), 180 mg (11/22/2019), 180 mg (12/06/2019), 180 mg (12/20/2019), 180 mg (01/03/2020), 180 mg (01/29/2020), 180 mg (02/12/2020), 180 mg (02/26/2020), 180 mg (03/11/2020), 180 mg (04/08/2020), 180 mg (04/24/2020), 180 mg (05/08/2020), 180 mg (05/22/2020), 180 mg (06/10/2020), 180 mg (06/25/2020) fluorouracil (ADRUCIL) 5,000 mg in sodium chloride 0.9 % 150 mL chemo infusion, 5,050 mg, Intravenous, 1 Day/Dose, 23 of 25 cycles Administration: 5,000 mg (07/24/2019), 5,000 mg (08/07/2019), 5,000 mg (08/21/2019), 5,050 mg (09/04/2019), 5,000 mg (10/04/2019), 5,000 mg (10/16/2019), 5,000 mg (11/22/2019), 5,000 mg (12/06/2019), 5,000 mg (12/20/2019), 5,000 mg (01/03/2020), 5,000 mg (01/29/2020), 5,000 mg (02/12/2020), 5,000 mg (02/26/2020), 5,000 mg (03/11/2020), 5,000 mg (03/25/2020), 5,000 mg (04/08/2020), 5,000 mg (04/24/2020), 5,000 mg (05/08/2020), 5,000 mg (05/22/2020), 5,000 mg (06/10/2020), 5,000 mg (06/25/2020) bevacizumab-bvzr (ZIRABEV) 400 mg in sodium chloride 0.9 % 100 mL chemo infusion, 5 mg/kg = 400 mg, Intravenous,  Once, 23 of 25 cycles Administration: 400 mg (08/07/2019), 400 mg (08/21/2019), 400 mg (09/04/2019), 400 mg  (09/20/2019), 400 mg (10/04/2019), 400 mg (10/16/2019), 400 mg (10/30/2019), 400 mg (11/22/2019), 400 mg (12/06/2019), 400 mg (12/20/2019), 400 mg (01/03/2020), 400 mg (01/29/2020), 400 mg (02/12/2020), 400 mg (02/26/2020), 400 mg (03/11/2020), 400 mg (03/25/2020), 400 mg (04/08/2020), 400 mg (04/24/2020), 400 mg (05/08/2020), 400 mg (05/22/2020), 400 mg (06/10/2020), 400 mg (06/25/2020)  for chemotherapy treatment.    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 NTHL1 is associated with autosomal recessive NTHL1-associated polyposis, meaning Oscar [name] is a carrier of this condition and does not have it himself. VUS in RAD50 also identified on the [facility] Multi-Cancer+RNA panel. The report date is 05/11/2022.  The Multi-Cancer Panel + RNA offered by [facility] includes sequencing and/or deletion duplication testing of the following 84 genes: AIP, ALK, APC, ATM, AXIN2,BAP1,  BARD1, BLM, BMPR1A, BRCA1, BRCA2, BRIP1, CASR, CDC73, CDH1, CDK4, CDKN1B, CDKN1C, CDKN2A (p14ARF), CDKN2A (p16INK4a), CEBPA, CHEK2, CTNNA1, DICER1, DIS3L2, EGFR (c.2369C>T, p.Thr790Met variant only), EPCAM (Deletion/duplication testing only), FH, FLCN, GATA2, GPC3, GREM1 (Promoter region deletion/duplication testing only), HOXB13 (c.251G>A, p.Gly84Glu), HRAS, KIT, MAX, MEN1, MET, MITF (c.952G>A, p.Glu318Lys variant only), MLH1, MSH2, MSH3, MSH6, MUTYH, NBN, NF1, NF2, NTHL1, PALB2, PDGFRA, PHOX2B, PMS2, POLD1, POLE, POT1, PRKAR1A, PTCH1, PTEN, RAD50, RAD51C, RAD51D, RB1, RECQL4, RET, RUNX1, SDHAF2, SDHA (sequence changes only), SDHB, SDHC, SDHD, SMAD4,  SMARCA4,  SMARCB1, SMARCE1, STK11, SUFU, TERC, TERT, TMEM127, TP53, TSC1, TSC2, VHL, WRN and WT1.    Patient scheduled for virtual visit this afternoon due to granddaughter testing positive for covid, possible exposure. All medications and chart reviewed with patient and his daughter Audrea Muscat. Patient and daughter would like to know if he should continue taking aspirin, lipitor and plavix. Patient reports abdominal pain on right side, 6/10 at its worst. Oxycodone improves pain to 1/10.- RN Argentina Donovan.    Chief Complaint: colon cancer   History of present illness:Oscar [name] 81 y.o.  male with history of with metastatic colon cancer to the liver-s/p multiple lines of therapy -most recently on FOLFIRI + Bev is here follow-up/review results of the follow-up/review results of the CT scan.  In the interim patient was admitted to hospital for stroke; and also status post left CEA stenting.  Patient is currently off therapy because of recent admission/stroke.  Patient complains of worsening pain in his right lower abdomen.  He is entering oxycodone which seems to be helping the pain.   Observation/objective: Alert & oriented x 3. In No acute distress.   Assessment and plan: Cancer of right colon [facility]) #Right-sided colon adenocarcinoma-with synchronous metastasis to liver/unresectable;  S/p Evaluation at [facility] II opinion/reviewed the note from [facility]; and also discussed with Dr Dennison Nancy, who agrees with current treatment plan.  SEP 2023- CT scan-shows progressive disease; the lungs; liver; although CEA seems to be improving. Status post 9 cycles of FOLFIRI +BEV-   #Further chemotherapy is on hold because of recent stroke; declining performance status.  We will need to discontinue current therapy given progression of disease/stroke.   #Unfortunately, no standard chemotherapy/targeted therapy options available.  Discussed with Dr. Dennison Nancy at [facility] patient's performance status improves could consider for  clinical trial. ...  #Recent stroke [right-sided weakness]-multifactorial-agree with carotid stenting on the left.  Recommend discontinuation of Avastin. see above.   # Nausea/vomitting- sec to chemo G-1; zofran prn.STABLE.    # Right upper quadrant pain-secondary to malignancy-s/p evaluation with [name]; continue oxycodone 5 mg up to 1-2 pills a day as needed.   STABLE.   # Proteinuria: UA +100 protein: JULY 2023 protein: creatinine ratio:- 0.02- See above.   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 Discussed with patient /daughter re: genetic counselling for family-recommend   # Mediport- functioning-STABLE.   Mon/Tues # DISPOSITION:   #  HOLD FOLFIRI +Zira-Bev today; cancel pump off on D-3; DE-ACCESS  # follow up  On OCT 9th;  MD;labs- cbc/cmp CEA ; FOLFIRI; pump off D-3; CT CAP priro--Dr.B         Follow-up instructions:  I discussed the assessment and treatment plan with the patient.  The patient was provided an opportunity to ask questions and all were answered.  The patient agreed with the plan and demonstrated understanding of instructions.  The patient was advised to call back or seek an in person evaluation if the symptoms worsen or if the condition fails to improve as anticipated.    Dr. Charlaine Dalton [place] at [facility] 07/15/2022 1:24 PM

## 2022-07-15 NOTE — Progress Notes (Deleted)
[place] NOTE  Patient Care Team: Sofie Hartigan, MD as PCP - General (Family Medicine) Cammie Sickle, MD as Consulting Physician (Hematology and Oncology)  CHIEF COMPLAINTS/PURPOSE OF CONSULTATION: Colon cancer  #  Oncology History Overview Note  # MAY 2020- 3. 03/09/19 Liver biopsy. Microscopic examination shows malignant cells with glandular architecture consistent with adenocarcinoma. The malignant cells are positive for CK20 and CDX-2. These findings support the clinical impression of metastatic colon adenocarcinoma. 4. 03/10/19 R hemicolectomy. Tumor site cecum. Adenocarcinoma. Mucinous features present. G2. No tumor deposits. Invades visceral peritoneum. No tumor perforation. LVI present. PNI not identified. All margins uninvolved. 1/12 LNs. PT4apN1. Periappendiceal inflammation c/w resolving abscess. Microsatellite stable (MSS). [Dr.Mettu; [facility]]  # SEP 4th 2020 [compared to May 2020]  Interval increase in size of the metastases to the hepatic dome, The metastasis to the left hepatic lobe is unchanged; 2.  New subcentimeter hypoattenuating lesion in the inferior right hepatic lobe, incompletely characterized on CT. 3.  Postsurgical changes following right hemicolectomy.  # OCT 2020- FOLFOX +avastin; CT dec 22nd 2020- [compared to [facility] sep 9th 2020]-Liver- slight progression versus stable disease; CT scan SEP 4th 2021- Progressive disease-left lower lobe lung nodule 8 mm [previously 4 mm]; increase in size of the hepatic metastases by few millimeters.;  Soft tissue nodule adjacent to anastomotic site again increased by few millimeters.STOP FOLFOX; cont avastin  #  SEP 20th, 2021- FOLFIRI+ AVASTIN; HOLD FEB 2022- sec to poor tolerance [peptic ulcer]; 3/30-liver ablation-   [bland embolization of segment 2/3 liver lesion on 12/19/2020;  microwave ablation of both liver lesions.  # ?July 25th, 2022- BEV _ TAS 102.MAY 2022- s/p Liver ablation;Tas 102- stopped  in Nov-Dec 2022-   # DEC 2022-progressive disease-lung and liver/abdomen; JAN 31st 2023-Stivarga 2 pills a day-3 weeks on 1 week off.  Hold off any dose escalation given ongoing fatigue/poor tolerance in general. MARCH 2023- discontinued Progression. II opinion at [facility]-   # MAY 2023- FOLFIRI+ Bev- s/p 9 cycles- Progression of disease/liver+lung.   # FEB /16-EGD [Dr.Anna-duodenal ulcer-? meloxicam]  # JAN 2022- COVID [s/p Mab infusion; pills; skin rash resolved.]  # NGS/F-ONE-MUTATED K-RAS [G]       Cancer of right colon ([place])  07/05/2019 Initial Diagnosis   Cancer of right colon ([place])   07/24/2019 - 06/25/2020 Chemotherapy   The patient had dexamethasone (DECADRON) 4 MG tablet, 8 mg, Oral, Daily, 1 of 1 cycle, Start date: --, End date: -- palonosetron (ALOXI) injection 0.25 mg, 0.25 mg, Intravenous,  Once, 23 of 25 cycles Administration: 0.25 mg (07/24/2019), 0.25 mg (08/07/2019), 0.25 mg (08/21/2019), 0.25 mg (09/04/2019), 0.25 mg (09/20/2019), 0.25 mg (10/04/2019), 0.25 mg (10/16/2019), 0.25 mg (10/30/2019), 0.25 mg (11/22/2019), 0.25 mg (12/06/2019), 0.25 mg (12/20/2019), 0.25 mg (01/03/2020), 0.25 mg (01/29/2020), 0.25 mg (02/12/2020), 0.25 mg (02/26/2020), 0.25 mg (03/11/2020), 0.25 mg (03/25/2020), 0.25 mg (04/08/2020), 0.25 mg (04/24/2020), 0.25 mg (05/08/2020), 0.25 mg (05/22/2020), 0.25 mg (06/10/2020), 0.25 mg (06/25/2020) leucovorin 800 mg in dextrose 5 % 250 mL infusion, 844 mg, Intravenous,  Once, 23 of 25 cycles Administration: 800 mg (07/24/2019), 800 mg (08/07/2019), 800 mg (08/21/2019), 800 mg (09/04/2019), 800 mg (09/20/2019), 800 mg (10/04/2019), 800 mg (10/16/2019), 800 mg (10/30/2019), 800 mg (11/22/2019), 800 mg (12/06/2019), 800 mg (12/20/2019), 800 mg (01/03/2020), 800 mg (01/29/2020), 800 mg (02/12/2020), 800 mg (02/26/2020), 800 mg (03/11/2020), 800 mg (03/25/2020), 800 mg (04/08/2020), 800 mg (04/24/2020), 800 mg (05/08/2020), 800 mg (05/22/2020), 800 mg (06/10/2020), 800 mg (06/25/2020) oxaliplatin (  ELOXATIN) 180 mg  in dextrose 5 % 500 mL chemo infusion, 85 mg/m2 = 180 mg, Intravenous,  Once, 23 of 25 cycles Dose modification: 178 mg (original dose 85 mg/m2, Cycle 17, Reason: Other (see comments), Comment: insurance adjusted dose ) Administration: 180 mg (07/24/2019), 180 mg (08/07/2019), 180 mg (08/21/2019), 180 mg (09/04/2019), 180 mg (09/20/2019), 180 mg (10/04/2019), 180 mg (10/16/2019), 180 mg (10/30/2019), 180 mg (11/22/2019), 180 mg (12/06/2019), 180 mg (12/20/2019), 180 mg (01/03/2020), 180 mg (01/29/2020), 180 mg (02/12/2020), 180 mg (02/26/2020), 180 mg (03/11/2020), 180 mg (04/08/2020), 180 mg (04/24/2020), 180 mg (05/08/2020), 180 mg (05/22/2020), 180 mg (06/10/2020), 180 mg (06/25/2020) fluorouracil (ADRUCIL) 5,000 mg in sodium chloride 0.9 % 150 mL chemo infusion, 5,050 mg, Intravenous, 1 Day/Dose, 23 of 25 cycles Administration: 5,000 mg (07/24/2019), 5,000 mg (08/07/2019), 5,000 mg (08/21/2019), 5,050 mg (09/04/2019), 5,000 mg (10/04/2019), 5,000 mg (10/16/2019), 5,000 mg (11/22/2019), 5,000 mg (12/06/2019), 5,000 mg (12/20/2019), 5,000 mg (01/03/2020), 5,000 mg (01/29/2020), 5,000 mg (02/12/2020), 5,000 mg (02/26/2020), 5,000 mg (03/11/2020), 5,000 mg (03/25/2020), 5,000 mg (04/08/2020), 5,000 mg (04/24/2020), 5,000 mg (05/08/2020), 5,000 mg (05/22/2020), 5,000 mg (06/10/2020), 5,000 mg (06/25/2020) bevacizumab-bvzr (ZIRABEV) 400 mg in sodium chloride 0.9 % 100 mL chemo infusion, 5 mg/kg = 400 mg, Intravenous,  Once, 23 of 25 cycles Administration: 400 mg (08/07/2019), 400 mg (08/21/2019), 400 mg (09/04/2019), 400 mg (09/20/2019), 400 mg (10/04/2019), 400 mg (10/16/2019), 400 mg (10/30/2019), 400 mg (11/22/2019), 400 mg (12/06/2019), 400 mg (12/20/2019), 400 mg (01/03/2020), 400 mg (01/29/2020), 400 mg (02/12/2020), 400 mg (02/26/2020), 400 mg (03/11/2020), 400 mg (03/25/2020), 400 mg (04/08/2020), 400 mg (04/24/2020), 400 mg (05/08/2020), 400 mg (05/22/2020), 400 mg (06/10/2020), 400 mg (06/25/2020)  for chemotherapy treatment.    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 NTHL1 is associated with autosomal recessive NTHL1-associated polyposis, meaning Mr. Harkins is a carrier of this condition and does not have it himself. VUS in RAD50 also identified on the [facility] Multi-Cancer+RNA panel. The report date is 05/11/2022.  The Multi-Cancer Panel + RNA offered by [facility] includes sequencing and/or deletion duplication testing of the following 84 genes: AIP, ALK, APC, ATM, AXIN2,BAP1,  BARD1, BLM, BMPR1A, BRCA1, BRCA2, BRIP1, CASR, CDC73, CDH1, CDK4, CDKN1B, CDKN1C, CDKN2A (p14ARF), CDKN2A (p16INK4a), CEBPA, CHEK2, CTNNA1, DICER1, DIS3L2, EGFR (c.2369C>T, p.Thr790Met variant only), EPCAM (Deletion/duplication testing only), FH, FLCN, GATA2, GPC3, GREM1 (Promoter region deletion/duplication testing only), HOXB13 (c.251G>A, p.Gly84Glu), HRAS, KIT, MAX, MEN1, MET, MITF (c.952G>A, p.Glu318Lys variant only), MLH1, MSH2, MSH3, MSH6, MUTYH, NBN, NF1, NF2, NTHL1, PALB2, PDGFRA, PHOX2B, PMS2, POLD1, POLE, POT1, PRKAR1A, PTCH1, PTEN, RAD50, RAD51C, RAD51D, RB1, RECQL4, RET, RUNX1, SDHAF2, SDHA (sequence changes only), SDHB, SDHC, SDHD, SMAD4, SMARCA4, SMARCB1, SMARCE1, STK11, SUFU, TERC, TERT, TMEM127, TP53, TSC1, TSC2, VHL, WRN and WT1.    HISTORY OF PRESENTING ILLNESS: Patient in wheelchair.  Accompanied by his daughter.  Oscar [name] 81 y.o.  male with metastatic colon cancer to the liver-s/p multiple lines of therapy -most recently on FOLFIRI + Bev is here follow-up/review results of the follow-up/review results of the CT scan.  In the interim patient was admitted  to hospital for stroke; and also status post left CEA stenting.  Patient is currently off therapy because of recent admission/stroke.  .....  Patient is currently getting physical therapy.   Denies any worsening abdominal pain-however continues to take oxycodone 5 mg 1 to 2 pills a day.  No stools normal.  No diarrhea.  No weight loss.   Review of Systems  Constitutional:  Positive for malaise/fatigue. Negative for chills, diaphoresis and fever.  HENT:  Negative for nosebleeds and sore throat.   Eyes:  Negative for double vision.  Respiratory:  Negative for cough, hemoptysis, sputum production, shortness of breath and wheezing.   Cardiovascular:  Negative for chest pain, palpitations, orthopnea and leg swelling.  Gastrointestinal:  Negative for blood in stool, constipation, heartburn, melena, nausea and vomiting.  Genitourinary:  Negative for dysuria, frequency and urgency.  Musculoskeletal:  Positive for back pain and joint pain.  Skin: Negative.  Negative for itching and rash.  Neurological:  Positive for tingling. Negative for focal weakness, weakness and headaches.  Endo/Heme/Allergies:  Does not bruise/bleed easily.  Psychiatric/Behavioral:  Negative for depression. The patient is not nervous/anxious and does not have insomnia.      MEDICAL HISTORY:  Past Medical History:  Diagnosis Date   Arthritis    Cancer ([place])    colon cancer 02/2019 per pt    Family history of adverse reaction to anesthesia    cousin took all night to wake up from anesthesia   H/O colon cancer, stage IV    Hyperlipemia    Neuromuscular disorder ([place])    Pre-diabetes     SURGICAL HISTORY: Past Surgical History:  Procedure Laterality Date   CAROTID PTA/STENT INTERVENTION Left 07/01/2022   Procedure: CAROTID PTA/STENT INTERVENTION;  Surgeon: Algernon Huxley, MD;  Location: [facility];  Service: Cardiovascular;  Laterality: Left;   COLON SURGERY     ESOPHAGOGASTRODUODENOSCOPY (EGD) WITH  PROPOFOL N/A 12/26/2020   Procedure: ESOPHAGOGASTRODUODENOSCOPY (EGD) WITH PROPOFOL;  Surgeon: [name], MD;  Location: [facility];  Service: Gastroenterology;  Laterality: N/A;   IR ANGIOGRAM SELECTIVE EACH ADDITIONAL VESSEL  12/19/2020   IR ANGIOGRAM SELECTIVE EACH ADDITIONAL VESSEL  03/24/2021   IR ANGIOGRAM SELECTIVE EACH ADDITIONAL VESSEL  03/24/2021   IR ANGIOGRAM SELECTIVE EACH ADDITIONAL VESSEL  04/04/2021   IR ANGIOGRAM SELECTIVE EACH ADDITIONAL VESSEL  04/04/2021   IR ANGIOGRAM SELECTIVE EACH ADDITIONAL VESSEL  04/04/2021   IR ANGIOGRAM VISCERAL SELECTIVE  12/19/2020   IR ANGIOGRAM VISCERAL SELECTIVE  03/24/2021   IR ANGIOGRAM VISCERAL SELECTIVE  04/04/2021   IR ANGIOGRAM VISCERAL SELECTIVE  04/04/2021   IR EMBO ARTERIAL NOT HEMORR HEMANG INC GUIDE ROADMAPPING  03/24/2021   IR EMBO TUMOR ORGAN ISCHEMIA INFARCT INC GUIDE ROADMAPPING  12/19/2020   IR EMBO TUMOR ORGAN ISCHEMIA INFARCT INC GUIDE ROADMAPPING  04/04/2021   IR IMAGING GUIDED PORT INSERTION  07/20/2019   IR RADIOLOGIST EVAL & MGMT  12/05/2020   IR RADIOLOGIST EVAL & MGMT  02/11/2021   IR RADIOLOGIST EVAL & MGMT  03/04/2021   IR RADIOLOGIST EVAL & MGMT  04/29/2021   IR RADIOLOGIST EVAL & MGMT  07/22/2021   IR RADIOLOGIST EVAL & MGMT  11/19/2021   IR US GUIDE VASC ACCESS RIGHT  12/19/2020   IR US GUIDE VASC ACCESS RIGHT  03/24/2021   IR US GUIDE VASC ACCESS RIGHT  04/04/2021   JOINT REPLACEMENT Left 2010   Knee   RADIOLOGY WITH ANESTHESIA N/A 01/15/2021  Procedure: CT WITH ANESTHESIA MICROWAVE ABLATION OF LIVER;  Surgeon: Criselda Peaches, MD;  Location: [facility];  Service: Anesthesiology;  Laterality: N/A;    SOCIAL HISTORY: Social History   Socioeconomic History   Marital status: Married    Spouse name: Not on file   Number of children: Not on file   Years of education: Not on file   Highest education level: Not on file  Occupational History   Not on file  Tobacco Use   Smoking status: Every Day    Packs/day: 0.25    Years:  15.00    Total pack years: 3.75    Types: Cigarettes   Smokeless tobacco: Never   Tobacco comments:    1 to 2 cigarettes a day occasionally  Vaping Use   Vaping Use: Never used  Substance and Sexual Activity   Alcohol use: Yes    Comment: rarely    Drug use: No   Sexual activity: Not on file  Other Topics Concern   Not on file  Social History Narrative   Recruitment consultant retd; lives in [place]; smoking 3cig/day; [3/4 ppd x started at 9 years]; no alcohol. Son & daughter; wife dementia [waiting for placement].    Social Determinants of Health   Financial Resource Strain: Not on file  Food Insecurity: No Food Insecurity (07/01/2022)   Hunger Vital Sign    Worried About Running Out of Food in the Last Year: Never true    Ran Out of Food in the Last Year: Never true  Transportation Needs: No Transportation Needs (07/02/2022)   PRAPARE - Hydrologist (Medical): No    Lack of Transportation (Non-Medical): No  Physical Activity: Not on file  Stress: Not on file  Social Connections: Not on file  Intimate Partner Violence: Not At Risk (07/02/2022)   Humiliation, Afraid, Rape, and Kick questionnaire    Fear of Current or Ex-Partner: No    Emotionally Abused: No    Physically Abused: No    Sexually Abused: No    FAMILY HISTORY: Family History  Problem Relation Age of Onset   Peptic Ulcer Disease Father     ALLERGIES:  is allergic to ace inhibitors.  MEDICATIONS:  Current Outpatient Medications  Medication Sig Dispense Refill   aspirin EC 81 MG tablet Take 1 tablet (81 mg total) by mouth daily. Swallow whole. 30 tablet 11   atorvastatin (LIPITOR) 20 MG tablet Take 1 tablet (20 mg total) by mouth daily. 30 tablet 0   clopidogrel (PLAVIX) 75 MG tablet Take 1 tablet (75 mg total) by mouth daily. 30 tablet 11   oxyCODONE (OXY IR/ROXICODONE) 5 MG immediate release tablet Take 1 tablet (5 mg total) by mouth every 8 (eight) hours as needed for  severe pain. 60 tablet 0   tamsulosin (FLOMAX) 0.4 MG CAPS capsule Take 1 capsule (0.4 mg total) by mouth daily. (Patient not taking: Reported on 07/07/2022) 90 capsule 1   No current facility-administered medications for this visit.   Facility-Administered Medications Ordered in Other Visits  Medication Dose Route Frequency Provider Last Rate Last Admin   atropine 1 MG/ML injection            palonosetron (ALOXI) 0.25 MG/5ML injection               .  PHYSICAL EXAMINATION: ECOG PERFORMANCE STATUS: 0 - Asymptomatic  There were no vitals filed for this visit.   There were no vitals filed for this visit.  Physical Exam HENT:     Head: Normocephalic and atraumatic.     Mouth/Throat:     Pharynx: No oropharyngeal exudate.  Eyes:     Pupils: Pupils are equal, round, and reactive to light.  Cardiovascular:     Rate and Rhythm: Normal rate and regular rhythm.  Pulmonary:     Effort: No respiratory distress.     Breath sounds: No wheezing.     Comments: Decreased breath sounds bilaterally.  No wheeze or crackles Abdominal:     General: Bowel sounds are normal. There is no distension.     Palpations: Abdomen is soft. There is no mass.     Tenderness: There is no abdominal tenderness. There is no guarding or rebound.  Musculoskeletal:        General: No tenderness. Normal range of motion.     Cervical back: Normal range of motion and neck supple.  Skin:    General: Skin is warm.  Neurological:     Mental Status: He is alert and oriented to person, place, and time.  Psychiatric:        Mood and Affect: Affect normal.    LABORATORY DATA:  I have reviewed the data as listed Lab Results  Component Value Date   WBC 5.8 07/10/2022   HGB 10.9 (L) 07/10/2022   HCT 34.3 (L) 07/10/2022   MCV 100.6 (H) 07/10/2022   PLT 215 07/10/2022   Recent Labs    06/23/22 0953 07/01/22 1012 07/02/22 0445 07/07/22 0918 07/10/22 1149  NA 137  --  139 138 141  K 3.7  --  3.8 3.8 3.8   CL 104  --  110 108 108  CO2 25  --  $R'24 26 25  'by$ GLUCOSE 227*  --  102* 164* 143*  BUN 12   < > $R'11 11 10  'Mb$ CREATININE 0.76   < > 0.76 0.69 0.89  CALCIUM 8.1*  --  7.6* 8.1* 8.4*  GFRNONAA >60   < > >60 >60 >60  PROT 6.4*  --   --  6.9 7.4  ALBUMIN 2.6*  --   --  2.5* 2.6*  AST 34  --   --  53* 40  ALT 17  --   --  25 21  ALKPHOS 119  --   --  180* 160*  BILITOT 0.5  --   --  1.0 1.0   < > = values in this interval not displayed.     RADIOGRAPHIC STUDIES: I have personally reviewed the radiological images as listed and agreed with the findings in the report. US Paracentesis  Result Date: 07/06/2022 INDICATION: Patient with a history of colon cancer with liver metastases. Patient now presents with ascites. Interventional radiology asked to perform a therapeutic paracentesis. EXAM: ULTRASOUND GUIDED PARACENTESIS MEDICATIONS: 1% lidocaine 10 mL COMPLICATIONS: None immediate. PROCEDURE: Informed written consent was obtained from the patient after a discussion of the risks, benefits and alternatives to treatment. A timeout was performed prior to the initiation of the procedure. Initial ultrasound scanning demonstrates a large amount of ascites within the left lower abdominal quadrant. The left lower abdomen was prepped and draped in the usual sterile fashion. 1% lidocaine was used for local anesthesia. Following this, a 19 gauge, 7-cm, Yueh catheter was introduced. An ultrasound image was saved for documentation purposes. The paracentesis was performed. The catheter was removed and a dressing was applied. The patient tolerated the procedure well without immediate post procedural complication. FINDINGS: A total of  approximately 2 L of clear yellow fluid was removed. IMPRESSION: Successful ultrasound-guided paracentesis yielding 2 liters of peritoneal fluid. Read by: Soyla Dryer, NP Electronically Signed   By: Ruthann Cancer M.D.   On: 07/06/2022 16:09   PERIPHERAL VASCULAR CATHETERIZATION  Result  Date: 07/01/2022 See surgical note for result.  CT CHEST ABDOMEN PELVIS W CONTRAST  Result Date: 06/30/2022 CLINICAL DATA:  Restaging metastatic colon cancer. * Tracking Code: BO * EXAM: CT CHEST, ABDOMEN, AND PELVIS WITH CONTRAST TECHNIQUE: Multidetector CT imaging of the chest, abdomen and pelvis was performed following the standard protocol during bolus administration of intravenous contrast. RADIATION DOSE REDUCTION: This exam was performed according to the departmental dose-optimization program which includes automated exposure control, adjustment of the mA and/or kV according to patient size and/or use of iterative reconstruction technique. CONTRAST:  155mL OMNIPAQUE IOHEXOL 300 MG/ML  SOLN COMPARISON:  Multiple previous imaging studies. The most recent CT scan is 04/17/2019 the D FINDINGS: CT CHEST FINDINGS Cardiovascular: The heart is normal in size. No pericardial effusion. The aorta is within normal limits in caliber for age. Stable advanced atherosclerotic changes. Stable three-vessel coronary artery calcifications. Mediastinum/Nodes: Stable scattered small mediastinal nodes. Right hilar node on image 29/2 measures 18 mm and is stable. Left hilar node on image number 31/2 measures 14 mm and previously measured 13 mm. Subcarinal node on image 34/2 measures 10 mm and is unchanged. The esophagus is grossly normal. Small scattered endotracheal lesions likely representing inflammatory debris/mucous. Lungs/Pleura: Progressive pulmonary metastatic disease. Left upper lobe pulmonary nodule on image number 28/4 measures 12 mm and previously measured 10 mm. Bilobed left upper lobe lesion on image 70/4 measures 26 x 16 mm and previously measured 23 x 13 mm. Adjacent more peripheral nodule on image number 65/4 measures 18 mm and previously measured 15 mm. Right upper lobe pulmonary lesion image 72/4 measures 20 x 17 mm and previously measured 17 x 12 mm. 3 cm left lower lobe lesion on image number 118/4  previously measured 2.4 cm. Numerous other smaller pulmonary nodules are stable to slightly larger. No definite new pulmonary lesions. Stable moderate-sized left pleural effusion. Musculoskeletal: No significant bony findings. No evidence of osseous metastatic disease. CT ABDOMEN PELVIS FINDINGS Hepatobiliary: Dominant metastatic lesion at the hepatic dome is slightly larger. It measures 7.9 x 7.1 cm on image 52/2 and measured 7.5 x 7.1 cm on the prior CT. The largest left hepatic lobe lesion measures 5.5 x 5.0 cm and previously measured 5.6 x 4.9 cm. Other smaller metastatic lesions are stable. No intrahepatic biliary dilatation. Stable appearance of the gallbladder. Stable common bile duct dilatation. Pancreas: No mass, inflammation or ductal dilatation. Spleen: Normal size.  No focal lesions. Adrenals/Urinary Tract: The left adrenal gland is normal and stable. Slightly larger large infiltrative mass involving the right adrenal gland and right kidney. This measures approximately 7.9 x 3.5 cm and previously measured 6.93.9 cm. Significant compression of the IVC. Stomach/Bowel: The stomach, duodenum, small bowel and colon are grossly normal. Vascular/Lymphatic: Stable advanced atherosclerotic calcification involving the aorta and iliac arteries. Stable celiac axis, periportal and gastrohepatic ligament adenopathy. Reproductive: The prostate gland and seminal vesicles are unremarkable. Other: Persistent scattered omental and peritoneal surface lesions consistent with peritoneal carcinomatosis. Right-sided lesion near the ascending colon on image number 77/2 measures 17 mm and previously measured 16 mm. 15 mm lesion near the left hepatic lobe on image 62/2 previously measured 10 mm. The lesion between the right kidney and adjacent small bowel loops measures 3.3  x 2.5 cm and previously measured 2.8 x 2.3 cm. Progressive ascites. Musculoskeletal: No significant bony findings. IMPRESSION: 1. Progressive pulmonary  metastatic disease. 2. Stable mediastinal and hilar lymphadenopathy. 3. Overall stable hepatic metastatic lesions. 4. Slight interval increase in size of the right adrenal gland and right renal mass. 5. Progressive ascites. 6. Stable to slightly larger omental and peritoneal surface lesions consistent with peritoneal carcinomatosis. 7. Stable moderate-sized left pleural effusion. 8. Stable advanced atherosclerotic calcification involving the thoracic and abdominal aorta and iliac arteries. Aortic Atherosclerosis (ICD10-I70.0). Electronically Signed   By: Marijo Sanes M.D.   On: 06/30/2022 16:48     Latest Reference Range & Units Most Recent 06/09/21 08:30 06/25/21 10:24 07/07/21 09:06 07/21/21 08:32 08/04/21 08:54 08/18/21 08:31 09/01/21 08:47 09/25/21 08:29 10/28/21 10:14 11/18/21 14:20 12/08/21 09:06  CEA 0.0 - 4.7 ng/mL 301.0 (H) 12/08/21 09:06 91.6 (H) 84.1 (H) 87.6 (H) 89.1 (H) 108.0 (H) 108.0 (H) 107.0 (H) 105.0 (H) 119.0 (H) 177.0 (H) 301.0 (H)  (H): Data is abnormally high  ASSESSMENT & PLAN:   No problem-specific Assessment & Plan notes found for this encounter.  #Right-sided colon adenocarcinoma-with synchronous metastasis to liver/unresectable;  Currently on FOLFIRI+ [place]-Bev. S/p Evaluation at [facility] II opinion/reviewed the note from [facility]; and also discussed with Dr Dennison Nancy, who agrees with current treatment plan.  Status post 5 cycles of FOLFIRI +BEV- JUNE 29th, 2023- Improving appearance of metastatic disease lung nodules; pleural effusions; metastatic disease in the liver; abdominal soft tissue lesion/lymphadenopathy peritoneal disease [however still with extensive metastatic disease].   # HOLD cycle #  10 of FOLFIRI plus Bev-given the recent stroke.  We will discontinue Bev.  see below.  labs today reviewed; [facility] Kitchen CEA trending down. Improving-see above. Will repeat scans in end of sep. Will order scans today.  We will plan to restart chemotherapy in 4 weeks.  #Recent stroke [right-sided  weakness]-multifactorial-agree with carotid stenting on the left.  Recommend discontinuation of Avastin. see above.   # Nausea/vomitting- sec to chemo G-1; zofran prn.STABLE.    # Right upper quadrant pain-secondary to malignancy-s/p evaluation with [name]; continue oxycodone 5 mg up to 1-2 pills a day as needed.   STABLE.   # Proteinuria: UA +100 protein: JULY 2023 protein: creatinine ratio:- 0.02- See above.   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 Discussed with patient /daughter re: genetic counselling for family-recommend   # Mediport- functioning-STABLE.   Mon/Tues # DISPOSITION:   #  HOLD FOLFIRI +Zira-Bev today; cancel pump off on D-3; [place]-ACCESS  # follow up  On OCT 9th;  MD;labs- cbc/cmp CEA ; FOLFIRI; pump off D-3; CT CAP priro--Dr.B      All questions were answered. The patient knows to call the clinic with any problems, questions or concerns.    Cammie Sickle, MD 07/15/2022 8:54 AM

## 2022-07-15 NOTE — Progress Notes (Deleted)
Patient scheduled for virtual visit this afternoon due to granddaughter testing positive for covid, possible exposure. All medications and chart reviewed with patient and his daughter Audrea Muscat. Patient and daughter would like to know if he should continue taking aspirin, lipitor and plavix. Patient reports abdominal pain on right side, 6/10 at its worst. Oxycodone improves pain to 1/10.

## 2022-07-15 NOTE — Assessment & Plan Note (Addendum)
#  Right-sided colon adenocarcinoma-with synchronous metastasis to liver/unresectable;  S/p Evaluation at [facility] II opinion/reviewed the note from [facility]; and also discussed with Dr Dennison Nancy, who agrees with current treatment plan.  SEP 2023- CT scan-shows progressive disease; the lungs; liver; although CEA seems to be improving. Status post 9 cycles of FOLFIRI +BEV.   #Further chemotherapy is on hold because of recent stroke; declining performance status.  We will need to discontinue current therapy given progression of disease/stroke.   #Unfortunately, no standard chemotherapy/targeted therapy options available.  Discussed with Dr. Dennison Nancy at [facility] patient's performance status improves could consider for clinical trial.   #Recent stroke [right-sided weakness]-multifactorial-agree with carotid stenting on the left.  Recommend discontinuation of Avastin. see above.   # Nausea/vomitting- sec to chemo G-1; zofran prn.STABLE.    # Right upper quadrant pain-secondary to malignancy-s/p evaluation with [name]; continue oxycodone 5 mg up to 1-2 pills a day as needed.   STABLE.   # Proteinuria: UA +100 protein: JULY 2023 protein: creatinine ratio:- 0.02- See above.   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 Discussed with patient /daughter re: genetic counselling for family-recommend   # Mediport- functioning-STABLE.   # Given the incurable nature of the disease /poor tolerance of therapy and in general less than 6 months of life expectancy-I introduced hospice philosophy to the patient and family.  Discussed that goal of care should be directed to symptom management rather than treating the underlying disease; and in the process help improve quality of life rather than quantity.  Discussed with hospice  team would include-nurse, nurse aide, social worker and chaplain for help take care of patient with physical/emotional needs.    Mon/Tues # DISPOSITION:  # referral for hospice for colon cancer # cancel- all appt for October #Follow-up nov 1st week MD; labs CBC CMP; CEA-no chemotherapy- Dr.B

## 2022-07-15 NOTE — Telephone Encounter (Signed)
Daughter Velta Addison called to inform office that her dad has been exposure to covid. Granddaughter came home from college with symptoms and tested positive yesterday. Patient interacted with his granddaughter on Monday. Pt Has not personally tested given the exposure was just brief and he has no symptoms. [place] daughter has been isolating upstairs- different room. Patient and grand daughter has been masking and following the covid precautions.  Out of precautions, daughter would like to change visit to a mychart visit with Dr. Pete Glatter today to discuss goals of care. - treatment, vs palliative care vs roll for hospice.   Per daughter, patient still c/o of abdominal fullness and discomfort r/t his ascites.  Patient evaluated last Friday in ER, but no para performed at this visit.

## 2022-07-16 ENCOUNTER — Encounter: Payer: Self-pay | Admitting: Internal Medicine

## 2022-07-27 ENCOUNTER — Other Ambulatory Visit: Payer: Self-pay | Admitting: *Deleted

## 2022-07-27 ENCOUNTER — Telehealth: Payer: Medicare HMO | Admitting: Hospice and Palliative Medicine

## 2022-07-27 MED ORDER — TAMSULOSIN HCL 0.4 MG PO CAPS: 0.4000 mg | ORAL_CAPSULE | Freq: Every day | ORAL | 1 refills | Status: AC

## 2022-07-27 NOTE — Telephone Encounter (Signed)
Pharmacy sent request for Flomax RF

## 2022-07-28 ENCOUNTER — Ambulatory Visit: Payer: Medicare HMO

## 2022-07-28 ENCOUNTER — Ambulatory Visit: Payer: Medicare HMO | Admitting: Internal Medicine

## 2022-07-28 ENCOUNTER — Other Ambulatory Visit: Payer: Medicare HMO

## 2022-08-03 ENCOUNTER — Other Ambulatory Visit: Payer: Self-pay | Admitting: Hospice and Palliative Medicine

## 2022-08-03 MED ORDER — OXYCODONE HCL 10 MG PO TABS: 10.0000 mg | ORAL_TABLET | ORAL | 0 refills | Status: DC | PRN

## 2022-08-03 NOTE — Progress Notes (Signed)
Spoke with hospice nurse, April.  Patient having persistent pain.  Will refill and liberalize oxycodone.

## 2022-08-10 ENCOUNTER — Other Ambulatory Visit: Payer: Medicare HMO

## 2022-08-10 ENCOUNTER — Ambulatory Visit: Payer: Medicare HMO

## 2022-08-10 ENCOUNTER — Ambulatory Visit: Payer: Medicare HMO | Admitting: Internal Medicine

## 2022-08-17 ENCOUNTER — Other Ambulatory Visit: Payer: Self-pay | Admitting: Hospice and Palliative Medicine

## 2022-08-17 ENCOUNTER — Encounter (INDEPENDENT_AMBULATORY_CARE_PROVIDER_SITE_OTHER): Payer: Self-pay

## 2022-08-17 MED ORDER — OXYCODONE HCL 10 MG PO TABS: 10.0000 mg | ORAL_TABLET | ORAL | 0 refills | Status: DC | PRN

## 2022-08-17 NOTE — Progress Notes (Signed)
Hospice nurse, Rip Harbour, requested refill of oxycodone.  Rx sent to pharmacy.

## 2022-08-21 ENCOUNTER — Inpatient Hospital Stay: Payer: Medicare HMO

## 2022-08-21 ENCOUNTER — Inpatient Hospital Stay: Payer: Medicare HMO | Admitting: Internal Medicine

## 2022-09-03 ENCOUNTER — Other Ambulatory Visit: Payer: Self-pay | Admitting: Hospice and Palliative Medicine

## 2022-09-03 MED ORDER — OXYCODONE HCL 10 MG PO TABS: 10.0000 mg | ORAL_TABLET | ORAL | 0 refills | Status: AC | PRN

## 2022-09-03 MED ORDER — MORPHINE SULFATE ER 15 MG PO TBCR: 15.0000 mg | EXTENDED_RELEASE_TABLET | Freq: Two times a day (BID) | ORAL | 0 refills | Status: DC

## 2022-09-03 NOTE — Addendum Note (Signed)
Addended by: Altha Harm R on: 09/03/2022 01:11 PM   Modules accepted: Orders

## 2022-09-03 NOTE — Progress Notes (Addendum)
I received a call from hospice nurse, April. Patient has persistent pain despite taking oxycodone every 3-4 hours around-the-clock.  We will start him on MS Contin 15 mg every 12 hours.  He can continue oxycodone as needed for breakthrough pain.  Oxycodone also refilled per hospice request.

## 2022-09-28 ENCOUNTER — Other Ambulatory Visit: Payer: Self-pay | Admitting: Hospice and Palliative Medicine

## 2022-09-28 MED ORDER — MORPHINE SULFATE (CONCENTRATE) 10 MG /0.5 ML PO SOLN: 5.0000 mg | ORAL | 0 refills | Status: AC | PRN

## 2022-09-28 MED ORDER — LORAZEPAM 0.5 MG PO TABS: 0.5000 mg | ORAL_TABLET | ORAL | 0 refills | Status: AC | PRN

## 2022-09-28 MED ORDER — MORPHINE SULFATE ER 15 MG PO TBCR: 15.0000 mg | EXTENDED_RELEASE_TABLET | Freq: Two times a day (BID) | ORAL | 0 refills | Status: AC

## 2022-09-28 NOTE — Progress Notes (Signed)
I received a call from hospice nurse, April. Patient had moved to the [place] for symptom management and end of life care. However, he improved some and returned home. We discussed his medication management. Okay to start lorazepam for anxiety. Will send refills for MS Contin and morphine elixir. Okay to also start prn haldol and Levsin on comfort orders if needed.

## 2022-10-05 ENCOUNTER — Telehealth: Payer: Self-pay | Admitting: Internal Medicine

## 2022-10-05 NOTE — Telephone Encounter (Signed)
I called patient's daughter and offered my condolences.  Appreciate the cancer center staff for the care and support provided to the patient.

## 2022-10-19 DEATH — deceased
